# Patient Record
Sex: Female | Born: 1945
Health system: Southern US, Community
[De-identification: ages and names within clinical notes are randomized; demographics above are authoritative.]

## PROBLEM LIST (undated history)

## (undated) DIAGNOSIS — J449 Chronic obstructive pulmonary disease, unspecified: Secondary | ICD-10-CM

## (undated) DIAGNOSIS — R011 Cardiac murmur, unspecified: Secondary | ICD-10-CM

## (undated) DIAGNOSIS — K219 Gastro-esophageal reflux disease without esophagitis: Secondary | ICD-10-CM

## (undated) DIAGNOSIS — I10 Essential (primary) hypertension: Secondary | ICD-10-CM

## (undated) DIAGNOSIS — J45909 Unspecified asthma, uncomplicated: Secondary | ICD-10-CM

## (undated) DIAGNOSIS — C9110 Chronic lymphocytic leukemia of B-cell type not having achieved remission: Secondary | ICD-10-CM

## (undated) DIAGNOSIS — R519 Headache, unspecified: Secondary | ICD-10-CM

## (undated) DIAGNOSIS — E119 Type 2 diabetes mellitus without complications: Secondary | ICD-10-CM

## (undated) DIAGNOSIS — N189 Chronic kidney disease, unspecified: Secondary | ICD-10-CM

## (undated) DIAGNOSIS — I639 Cerebral infarction, unspecified: Secondary | ICD-10-CM

## (undated) DIAGNOSIS — J189 Pneumonia, unspecified organism: Secondary | ICD-10-CM

## (undated) DIAGNOSIS — I5032 Chronic diastolic (congestive) heart failure: Secondary | ICD-10-CM

## (undated) DIAGNOSIS — D496 Neoplasm of unspecified behavior of brain: Secondary | ICD-10-CM

## (undated) DIAGNOSIS — E785 Hyperlipidemia, unspecified: Secondary | ICD-10-CM

## (undated) DIAGNOSIS — I739 Peripheral vascular disease, unspecified: Secondary | ICD-10-CM

## (undated) DIAGNOSIS — C9590 Leukemia, unspecified not having achieved remission: Secondary | ICD-10-CM

## (undated) DIAGNOSIS — T7840XA Allergy, unspecified, initial encounter: Secondary | ICD-10-CM

## (undated) HISTORY — DX: Essential (primary) hypertension: I10

## (undated) HISTORY — PX: BRAIN TUMOR EXCISION: SHX577

## (undated) HISTORY — PX: OTHER SURGICAL HISTORY: SHX169

## (undated) HISTORY — PX: CHOLECYSTECTOMY, LAPAROSCOPIC: SHX56

## (undated) HISTORY — DX: Gastro-esophageal reflux disease without esophagitis: K21.9

## (undated) HISTORY — DX: Chronic diastolic (congestive) heart failure: I50.32

## (undated) HISTORY — DX: Allergy, unspecified, initial encounter: T78.40XA

## (undated) HISTORY — PX: CARDIAC CATHETERIZATION: SHX172

## (undated) HISTORY — DX: Peripheral vascular disease, unspecified: I73.9

## (undated) HISTORY — PX: ESOPHAGEAL DILATION: SHX303

## (undated) HISTORY — DX: Hyperlipidemia, unspecified: E78.5

## (undated) HISTORY — DX: Pneumonia, unspecified organism: J18.9

## (undated) HISTORY — DX: Chronic obstructive pulmonary disease, unspecified: J44.9

## (undated) HISTORY — PX: BRAIN MENINGIOMA EXCISION: SHX576

## (undated) HISTORY — DX: Cerebral infarction, unspecified: I63.9

## (undated) HISTORY — DX: Unspecified asthma, uncomplicated: J45.909

## (undated) HISTORY — DX: Chronic lymphocytic leukemia of B-cell type not having achieved remission: C91.10

---

## 1996-12-27 DIAGNOSIS — I639 Cerebral infarction, unspecified: Secondary | ICD-10-CM

## 1996-12-27 HISTORY — DX: Cerebral infarction, unspecified: I63.9

## 1998-10-23 ENCOUNTER — Encounter: Admission: RE | Admit: 1998-10-23 | Discharge: 1998-10-23 | Payer: Self-pay | Admitting: Family Medicine

## 1998-10-28 ENCOUNTER — Encounter: Admission: RE | Admit: 1998-10-28 | Discharge: 1998-10-28 | Payer: Self-pay | Admitting: Sports Medicine

## 1998-11-07 ENCOUNTER — Encounter: Admission: RE | Admit: 1998-11-07 | Discharge: 1998-11-07 | Payer: Self-pay | Admitting: Family Medicine

## 1999-02-24 ENCOUNTER — Encounter: Payer: Self-pay | Admitting: Emergency Medicine

## 1999-02-24 ENCOUNTER — Emergency Department (HOSPITAL_COMMUNITY): Admission: EM | Admit: 1999-02-24 | Discharge: 1999-02-24 | Payer: Self-pay | Admitting: Emergency Medicine

## 1999-03-02 ENCOUNTER — Encounter: Admission: RE | Admit: 1999-03-02 | Discharge: 1999-03-02 | Payer: Self-pay | Admitting: Family Medicine

## 1999-03-12 ENCOUNTER — Encounter: Admission: RE | Admit: 1999-03-12 | Discharge: 1999-03-12 | Payer: Self-pay | Admitting: Family Medicine

## 1999-04-02 ENCOUNTER — Encounter: Admission: RE | Admit: 1999-04-02 | Discharge: 1999-04-02 | Payer: Self-pay | Admitting: Family Medicine

## 1999-04-09 ENCOUNTER — Encounter: Admission: RE | Admit: 1999-04-09 | Discharge: 1999-04-09 | Payer: Self-pay | Admitting: Family Medicine

## 1999-04-13 ENCOUNTER — Encounter: Admission: RE | Admit: 1999-04-13 | Discharge: 1999-04-13 | Payer: Self-pay | Admitting: Sports Medicine

## 1999-04-16 ENCOUNTER — Encounter: Admission: RE | Admit: 1999-04-16 | Discharge: 1999-04-16 | Payer: Self-pay | Admitting: Family Medicine

## 1999-09-17 ENCOUNTER — Encounter: Admission: RE | Admit: 1999-09-17 | Discharge: 1999-09-17 | Payer: Self-pay | Admitting: Family Medicine

## 1999-10-20 ENCOUNTER — Encounter: Admission: RE | Admit: 1999-10-20 | Discharge: 1999-10-20 | Payer: Self-pay | Admitting: Sports Medicine

## 1999-11-12 ENCOUNTER — Encounter: Admission: RE | Admit: 1999-11-12 | Discharge: 1999-11-12 | Payer: Self-pay | Admitting: Family Medicine

## 1999-12-03 ENCOUNTER — Encounter: Admission: RE | Admit: 1999-12-03 | Discharge: 1999-12-03 | Payer: Self-pay | Admitting: Family Medicine

## 2000-02-08 ENCOUNTER — Emergency Department (HOSPITAL_COMMUNITY): Admission: EM | Admit: 2000-02-08 | Discharge: 2000-02-08 | Payer: Self-pay | Admitting: *Deleted

## 2000-02-08 ENCOUNTER — Encounter: Payer: Self-pay | Admitting: Emergency Medicine

## 2000-02-12 ENCOUNTER — Encounter: Admission: RE | Admit: 2000-02-12 | Discharge: 2000-02-12 | Payer: Self-pay | Admitting: Family Medicine

## 2000-02-19 ENCOUNTER — Encounter: Admission: RE | Admit: 2000-02-19 | Discharge: 2000-02-19 | Payer: Self-pay | Admitting: Family Medicine

## 2000-02-22 ENCOUNTER — Encounter: Admission: RE | Admit: 2000-02-22 | Discharge: 2000-02-22 | Payer: Self-pay | Admitting: Family Medicine

## 2000-02-26 ENCOUNTER — Encounter: Admission: RE | Admit: 2000-02-26 | Discharge: 2000-02-26 | Payer: Self-pay | Admitting: Family Medicine

## 2000-03-31 ENCOUNTER — Encounter: Admission: RE | Admit: 2000-03-31 | Discharge: 2000-03-31 | Payer: Self-pay | Admitting: Obstetrics

## 2000-04-13 ENCOUNTER — Ambulatory Visit (HOSPITAL_COMMUNITY): Admission: RE | Admit: 2000-04-13 | Discharge: 2000-04-13 | Payer: Self-pay | Admitting: Obstetrics & Gynecology

## 2000-04-13 ENCOUNTER — Encounter: Payer: Self-pay | Admitting: Obstetrics & Gynecology

## 2000-04-19 ENCOUNTER — Encounter: Admission: RE | Admit: 2000-04-19 | Discharge: 2000-04-19 | Payer: Self-pay | Admitting: Obstetrics & Gynecology

## 2000-06-14 ENCOUNTER — Encounter: Admission: RE | Admit: 2000-06-14 | Discharge: 2000-06-14 | Payer: Self-pay | Admitting: Obstetrics

## 2000-08-11 ENCOUNTER — Encounter: Admission: RE | Admit: 2000-08-11 | Discharge: 2000-08-11 | Payer: Self-pay | Admitting: Family Medicine

## 2000-12-04 ENCOUNTER — Emergency Department (HOSPITAL_COMMUNITY): Admission: EM | Admit: 2000-12-04 | Discharge: 2000-12-04 | Payer: Self-pay | Admitting: Emergency Medicine

## 2000-12-13 ENCOUNTER — Encounter: Admission: RE | Admit: 2000-12-13 | Discharge: 2000-12-13 | Payer: Self-pay | Admitting: Family Medicine

## 2001-03-14 ENCOUNTER — Encounter: Admission: RE | Admit: 2001-03-14 | Discharge: 2001-03-14 | Payer: Self-pay | Admitting: Family Medicine

## 2001-05-19 ENCOUNTER — Encounter: Admission: RE | Admit: 2001-05-19 | Discharge: 2001-05-19 | Payer: Self-pay | Admitting: Family Medicine

## 2001-05-19 ENCOUNTER — Encounter: Payer: Self-pay | Admitting: Family Medicine

## 2001-05-20 ENCOUNTER — Inpatient Hospital Stay (HOSPITAL_COMMUNITY): Admission: AD | Admit: 2001-05-20 | Discharge: 2001-05-22 | Payer: Self-pay | Admitting: Family Medicine

## 2001-05-21 ENCOUNTER — Encounter: Payer: Self-pay | Admitting: Family Medicine

## 2001-05-23 ENCOUNTER — Encounter: Admission: RE | Admit: 2001-05-23 | Discharge: 2001-05-23 | Payer: Self-pay | Admitting: Family Medicine

## 2001-05-26 ENCOUNTER — Emergency Department (HOSPITAL_COMMUNITY): Admission: EM | Admit: 2001-05-26 | Discharge: 2001-05-26 | Payer: Self-pay | Admitting: Emergency Medicine

## 2001-05-30 ENCOUNTER — Encounter: Admission: RE | Admit: 2001-05-30 | Discharge: 2001-05-30 | Payer: Self-pay | Admitting: Family Medicine

## 2001-06-04 ENCOUNTER — Encounter: Payer: Self-pay | Admitting: Emergency Medicine

## 2001-06-04 ENCOUNTER — Inpatient Hospital Stay (HOSPITAL_COMMUNITY): Admission: EM | Admit: 2001-06-04 | Discharge: 2001-06-07 | Payer: Self-pay | Admitting: Obstetrics and Gynecology

## 2001-06-14 ENCOUNTER — Encounter: Admission: RE | Admit: 2001-06-14 | Discharge: 2001-09-12 | Payer: Self-pay | Admitting: Sports Medicine

## 2001-06-28 ENCOUNTER — Encounter: Admission: RE | Admit: 2001-06-28 | Discharge: 2001-06-28 | Payer: Self-pay | Admitting: Family Medicine

## 2001-07-18 ENCOUNTER — Encounter: Admission: RE | Admit: 2001-07-18 | Discharge: 2001-07-18 | Payer: Self-pay | Admitting: Family Medicine

## 2001-10-04 ENCOUNTER — Encounter: Admission: RE | Admit: 2001-10-04 | Discharge: 2001-10-04 | Payer: Self-pay | Admitting: Family Medicine

## 2001-11-15 ENCOUNTER — Encounter: Admission: RE | Admit: 2001-11-15 | Discharge: 2001-11-15 | Payer: Self-pay | Admitting: Family Medicine

## 2002-01-04 ENCOUNTER — Encounter: Admission: RE | Admit: 2002-01-04 | Discharge: 2002-01-04 | Payer: Self-pay | Admitting: Family Medicine

## 2002-01-15 ENCOUNTER — Ambulatory Visit (HOSPITAL_COMMUNITY): Admission: RE | Admit: 2002-01-15 | Discharge: 2002-01-15 | Payer: Self-pay | Admitting: Family Medicine

## 2002-01-24 ENCOUNTER — Encounter: Admission: RE | Admit: 2002-01-24 | Discharge: 2002-01-24 | Payer: Self-pay | Admitting: Family Medicine

## 2002-01-30 ENCOUNTER — Ambulatory Visit (HOSPITAL_COMMUNITY): Admission: RE | Admit: 2002-01-30 | Discharge: 2002-01-30 | Payer: Self-pay | Admitting: Cardiovascular Disease

## 2002-01-30 ENCOUNTER — Encounter: Payer: Self-pay | Admitting: Cardiovascular Disease

## 2002-02-14 ENCOUNTER — Encounter: Admission: RE | Admit: 2002-02-14 | Discharge: 2002-02-14 | Payer: Self-pay | Admitting: Family Medicine

## 2002-03-01 ENCOUNTER — Observation Stay (HOSPITAL_COMMUNITY): Admission: EM | Admit: 2002-03-01 | Discharge: 2002-03-02 | Payer: Self-pay | Admitting: Emergency Medicine

## 2002-03-01 ENCOUNTER — Encounter: Payer: Self-pay | Admitting: Emergency Medicine

## 2002-03-04 ENCOUNTER — Emergency Department (HOSPITAL_COMMUNITY): Admission: EM | Admit: 2002-03-04 | Discharge: 2002-03-04 | Payer: Self-pay | Admitting: Emergency Medicine

## 2002-03-04 ENCOUNTER — Encounter: Payer: Self-pay | Admitting: Emergency Medicine

## 2002-03-06 ENCOUNTER — Encounter: Admission: RE | Admit: 2002-03-06 | Discharge: 2002-03-06 | Payer: Self-pay | Admitting: Sports Medicine

## 2002-03-16 ENCOUNTER — Encounter: Admission: RE | Admit: 2002-03-16 | Discharge: 2002-03-16 | Payer: Self-pay | Admitting: Family Medicine

## 2002-03-22 ENCOUNTER — Ambulatory Visit (HOSPITAL_COMMUNITY): Admission: RE | Admit: 2002-03-22 | Discharge: 2002-03-22 | Payer: Self-pay | Admitting: Cardiovascular Disease

## 2002-03-23 ENCOUNTER — Ambulatory Visit (HOSPITAL_COMMUNITY): Admission: RE | Admit: 2002-03-23 | Discharge: 2002-03-23 | Payer: Self-pay | Admitting: Cardiovascular Disease

## 2002-03-29 ENCOUNTER — Encounter: Admission: RE | Admit: 2002-03-29 | Discharge: 2002-03-29 | Payer: Self-pay | Admitting: Family Medicine

## 2002-04-07 ENCOUNTER — Emergency Department (HOSPITAL_COMMUNITY): Admission: EM | Admit: 2002-04-07 | Discharge: 2002-04-07 | Payer: Self-pay

## 2002-04-08 ENCOUNTER — Ambulatory Visit (HOSPITAL_COMMUNITY): Admission: RE | Admit: 2002-04-08 | Discharge: 2002-04-08 | Payer: Self-pay

## 2002-04-11 ENCOUNTER — Encounter (INDEPENDENT_AMBULATORY_CARE_PROVIDER_SITE_OTHER): Payer: Self-pay | Admitting: Specialist

## 2002-04-11 ENCOUNTER — Encounter: Payer: Self-pay | Admitting: General Surgery

## 2002-04-11 ENCOUNTER — Ambulatory Visit (HOSPITAL_COMMUNITY): Admission: RE | Admit: 2002-04-11 | Discharge: 2002-04-12 | Payer: Self-pay | Admitting: General Surgery

## 2002-04-18 ENCOUNTER — Encounter: Admission: RE | Admit: 2002-04-18 | Discharge: 2002-04-18 | Payer: Self-pay | Admitting: Family Medicine

## 2002-05-30 ENCOUNTER — Encounter: Admission: RE | Admit: 2002-05-30 | Discharge: 2002-05-30 | Payer: Self-pay | Admitting: Family Medicine

## 2002-07-05 ENCOUNTER — Encounter: Admission: RE | Admit: 2002-07-05 | Discharge: 2002-07-05 | Payer: Self-pay | Admitting: Family Medicine

## 2002-07-13 ENCOUNTER — Encounter: Admission: RE | Admit: 2002-07-13 | Discharge: 2002-07-13 | Payer: Self-pay | Admitting: Family Medicine

## 2002-07-16 ENCOUNTER — Encounter: Admission: RE | Admit: 2002-07-16 | Discharge: 2002-07-16 | Payer: Self-pay | Admitting: Family Medicine

## 2002-07-18 ENCOUNTER — Encounter: Admission: RE | Admit: 2002-07-18 | Discharge: 2002-07-18 | Payer: Self-pay | Admitting: Family Medicine

## 2002-07-30 ENCOUNTER — Encounter: Admission: RE | Admit: 2002-07-30 | Discharge: 2002-07-30 | Payer: Self-pay | Admitting: Sports Medicine

## 2002-07-30 ENCOUNTER — Encounter: Payer: Self-pay | Admitting: Sports Medicine

## 2002-09-28 ENCOUNTER — Encounter: Admission: RE | Admit: 2002-09-28 | Discharge: 2002-09-28 | Payer: Self-pay | Admitting: Sports Medicine

## 2002-10-01 ENCOUNTER — Encounter: Admission: RE | Admit: 2002-10-01 | Discharge: 2002-10-01 | Payer: Self-pay | Admitting: Family Medicine

## 2002-11-11 ENCOUNTER — Emergency Department (HOSPITAL_COMMUNITY): Admission: EM | Admit: 2002-11-11 | Discharge: 2002-11-11 | Payer: Self-pay | Admitting: Emergency Medicine

## 2003-01-10 ENCOUNTER — Encounter: Admission: RE | Admit: 2003-01-10 | Discharge: 2003-01-10 | Payer: Self-pay | Admitting: Family Medicine

## 2003-01-17 ENCOUNTER — Encounter: Admission: RE | Admit: 2003-01-17 | Discharge: 2003-01-17 | Payer: Self-pay | Admitting: Family Medicine

## 2003-03-14 ENCOUNTER — Encounter: Admission: RE | Admit: 2003-03-14 | Discharge: 2003-03-14 | Payer: Self-pay | Admitting: Family Medicine

## 2003-08-26 ENCOUNTER — Encounter: Admission: RE | Admit: 2003-08-26 | Discharge: 2003-08-26 | Payer: Self-pay | Admitting: Family Medicine

## 2003-10-09 ENCOUNTER — Encounter: Admission: RE | Admit: 2003-10-09 | Discharge: 2003-10-09 | Payer: Self-pay | Admitting: Family Medicine

## 2003-10-16 ENCOUNTER — Encounter: Admission: RE | Admit: 2003-10-16 | Discharge: 2003-10-16 | Payer: Self-pay | Admitting: Family Medicine

## 2003-11-04 ENCOUNTER — Encounter: Admission: RE | Admit: 2003-11-04 | Discharge: 2003-11-04 | Payer: Self-pay | Admitting: Family Medicine

## 2003-11-28 ENCOUNTER — Encounter: Admission: RE | Admit: 2003-11-28 | Discharge: 2003-11-28 | Payer: Self-pay | Admitting: Family Medicine

## 2003-12-30 ENCOUNTER — Encounter: Admission: RE | Admit: 2003-12-30 | Discharge: 2003-12-30 | Payer: Self-pay | Admitting: Family Medicine

## 2004-01-06 ENCOUNTER — Encounter: Admission: RE | Admit: 2004-01-06 | Discharge: 2004-01-06 | Payer: Self-pay | Admitting: Family Medicine

## 2004-01-14 ENCOUNTER — Encounter: Admission: RE | Admit: 2004-01-14 | Discharge: 2004-01-14 | Payer: Self-pay | Admitting: Family Medicine

## 2004-01-31 ENCOUNTER — Encounter: Admission: RE | Admit: 2004-01-31 | Discharge: 2004-01-31 | Payer: Self-pay | Admitting: Sports Medicine

## 2004-02-08 ENCOUNTER — Emergency Department (HOSPITAL_COMMUNITY): Admission: EM | Admit: 2004-02-08 | Discharge: 2004-02-08 | Payer: Self-pay | Admitting: Family Medicine

## 2004-02-19 ENCOUNTER — Encounter: Admission: RE | Admit: 2004-02-19 | Discharge: 2004-02-19 | Payer: Self-pay | Admitting: Family Medicine

## 2004-02-20 ENCOUNTER — Ambulatory Visit (HOSPITAL_COMMUNITY): Admission: RE | Admit: 2004-02-20 | Discharge: 2004-02-20 | Payer: Self-pay | Admitting: Gastroenterology

## 2004-02-25 ENCOUNTER — Encounter (INDEPENDENT_AMBULATORY_CARE_PROVIDER_SITE_OTHER): Payer: Self-pay | Admitting: *Deleted

## 2004-02-25 LAB — CONVERTED CEMR LAB

## 2004-03-26 ENCOUNTER — Encounter: Admission: RE | Admit: 2004-03-26 | Discharge: 2004-03-26 | Payer: Self-pay | Admitting: Sports Medicine

## 2004-04-17 ENCOUNTER — Emergency Department (HOSPITAL_COMMUNITY): Admission: EM | Admit: 2004-04-17 | Discharge: 2004-04-17 | Payer: Self-pay | Admitting: Family Medicine

## 2004-04-21 ENCOUNTER — Encounter: Admission: RE | Admit: 2004-04-21 | Discharge: 2004-04-21 | Payer: Self-pay | Admitting: Sports Medicine

## 2004-10-03 ENCOUNTER — Emergency Department (HOSPITAL_COMMUNITY): Admission: EM | Admit: 2004-10-03 | Discharge: 2004-10-03 | Payer: Self-pay | Admitting: Emergency Medicine

## 2004-10-09 ENCOUNTER — Ambulatory Visit: Payer: Self-pay | Admitting: Family Medicine

## 2004-10-16 ENCOUNTER — Ambulatory Visit: Payer: Self-pay | Admitting: Family Medicine

## 2004-11-11 ENCOUNTER — Ambulatory Visit: Payer: Self-pay | Admitting: Family Medicine

## 2004-12-31 ENCOUNTER — Ambulatory Visit: Payer: Self-pay | Admitting: Family Medicine

## 2005-02-01 ENCOUNTER — Ambulatory Visit: Payer: Self-pay | Admitting: Family Medicine

## 2005-03-03 ENCOUNTER — Ambulatory Visit: Payer: Self-pay | Admitting: *Deleted

## 2005-03-04 ENCOUNTER — Encounter: Admission: RE | Admit: 2005-03-04 | Discharge: 2005-03-04 | Payer: Self-pay | Admitting: Sports Medicine

## 2005-04-14 ENCOUNTER — Ambulatory Visit: Payer: Self-pay | Admitting: Family Medicine

## 2005-05-03 ENCOUNTER — Ambulatory Visit: Payer: Self-pay | Admitting: Family Medicine

## 2005-05-20 ENCOUNTER — Ambulatory Visit: Payer: Self-pay | Admitting: Family Medicine

## 2005-06-21 ENCOUNTER — Ambulatory Visit: Payer: Self-pay | Admitting: Sports Medicine

## 2005-07-06 ENCOUNTER — Ambulatory Visit: Payer: Self-pay | Admitting: Family Medicine

## 2005-08-11 ENCOUNTER — Ambulatory Visit: Payer: Self-pay | Admitting: Family Medicine

## 2005-08-25 ENCOUNTER — Ambulatory Visit: Payer: Self-pay | Admitting: Family Medicine

## 2005-12-02 ENCOUNTER — Ambulatory Visit: Payer: Self-pay | Admitting: Family Medicine

## 2005-12-13 ENCOUNTER — Emergency Department (HOSPITAL_COMMUNITY): Admission: EM | Admit: 2005-12-13 | Discharge: 2005-12-13 | Payer: Self-pay | Admitting: Emergency Medicine

## 2005-12-30 ENCOUNTER — Ambulatory Visit: Payer: Self-pay | Admitting: Family Medicine

## 2005-12-31 ENCOUNTER — Encounter: Admission: RE | Admit: 2005-12-31 | Discharge: 2005-12-31 | Payer: Self-pay | Admitting: Sports Medicine

## 2006-01-13 ENCOUNTER — Ambulatory Visit: Payer: Self-pay | Admitting: Family Medicine

## 2006-01-26 ENCOUNTER — Ambulatory Visit: Payer: Self-pay | Admitting: Family Medicine

## 2006-02-04 ENCOUNTER — Ambulatory Visit: Payer: Self-pay | Admitting: Family Medicine

## 2006-02-17 ENCOUNTER — Ambulatory Visit: Payer: Self-pay | Admitting: Family Medicine

## 2006-03-01 ENCOUNTER — Encounter: Admission: RE | Admit: 2006-03-01 | Discharge: 2006-03-16 | Payer: Self-pay | Admitting: Family Medicine

## 2006-03-18 ENCOUNTER — Ambulatory Visit: Payer: Self-pay | Admitting: Family Medicine

## 2006-04-06 ENCOUNTER — Ambulatory Visit: Payer: Self-pay | Admitting: Family Medicine

## 2006-05-11 ENCOUNTER — Ambulatory Visit: Payer: Self-pay | Admitting: Family Medicine

## 2006-08-24 ENCOUNTER — Ambulatory Visit: Payer: Self-pay | Admitting: Family Medicine

## 2006-09-27 ENCOUNTER — Ambulatory Visit: Payer: Self-pay | Admitting: Sports Medicine

## 2006-10-07 ENCOUNTER — Ambulatory Visit: Payer: Self-pay | Admitting: Family Medicine

## 2006-10-27 ENCOUNTER — Ambulatory Visit (HOSPITAL_COMMUNITY): Admission: RE | Admit: 2006-10-27 | Discharge: 2006-10-27 | Payer: Self-pay | Admitting: Family Medicine

## 2006-10-27 ENCOUNTER — Ambulatory Visit: Payer: Self-pay | Admitting: Sports Medicine

## 2007-02-14 ENCOUNTER — Ambulatory Visit: Payer: Self-pay | Admitting: Family Medicine

## 2007-02-23 DIAGNOSIS — F17200 Nicotine dependence, unspecified, uncomplicated: Secondary | ICD-10-CM | POA: Insufficient documentation

## 2007-02-23 DIAGNOSIS — E7800 Pure hypercholesterolemia, unspecified: Secondary | ICD-10-CM | POA: Insufficient documentation

## 2007-02-23 DIAGNOSIS — F411 Generalized anxiety disorder: Secondary | ICD-10-CM | POA: Insufficient documentation

## 2007-02-23 DIAGNOSIS — I10 Essential (primary) hypertension: Secondary | ICD-10-CM | POA: Insufficient documentation

## 2007-02-23 DIAGNOSIS — J449 Chronic obstructive pulmonary disease, unspecified: Secondary | ICD-10-CM | POA: Insufficient documentation

## 2007-02-23 DIAGNOSIS — K219 Gastro-esophageal reflux disease without esophagitis: Secondary | ICD-10-CM | POA: Insufficient documentation

## 2007-02-24 ENCOUNTER — Encounter (INDEPENDENT_AMBULATORY_CARE_PROVIDER_SITE_OTHER): Payer: Self-pay | Admitting: *Deleted

## 2007-03-09 ENCOUNTER — Ambulatory Visit: Payer: Self-pay | Admitting: Sports Medicine

## 2007-03-09 LAB — CONVERTED CEMR LAB: Hgb A1c MFr Bld: 6.4 %

## 2007-04-05 ENCOUNTER — Ambulatory Visit (HOSPITAL_COMMUNITY): Admission: RE | Admit: 2007-04-05 | Discharge: 2007-04-05 | Payer: Self-pay | Admitting: Cardiovascular Disease

## 2007-05-15 ENCOUNTER — Encounter (INDEPENDENT_AMBULATORY_CARE_PROVIDER_SITE_OTHER): Payer: Self-pay | Admitting: Family Medicine

## 2007-05-15 ENCOUNTER — Ambulatory Visit: Payer: Self-pay | Admitting: Family Medicine

## 2007-05-15 LAB — CONVERTED CEMR LAB
BUN: 10 mg/dL (ref 6–23)
CO2: 31 meq/L (ref 19–32)
Calcium: 9.7 mg/dL (ref 8.4–10.5)
Chloride: 101 meq/L (ref 96–112)
Creatinine, Ser: 0.97 mg/dL (ref 0.40–1.20)
Glucose, Bld: 111 mg/dL — ABNORMAL HIGH (ref 70–99)
Potassium: 4.3 meq/L (ref 3.5–5.3)
Sodium: 143 meq/L (ref 135–145)

## 2007-06-06 ENCOUNTER — Encounter (INDEPENDENT_AMBULATORY_CARE_PROVIDER_SITE_OTHER): Payer: Self-pay | Admitting: *Deleted

## 2007-08-29 ENCOUNTER — Telehealth: Payer: Self-pay | Admitting: *Deleted

## 2007-08-30 ENCOUNTER — Ambulatory Visit: Payer: Self-pay | Admitting: Family Medicine

## 2007-09-28 ENCOUNTER — Encounter (INDEPENDENT_AMBULATORY_CARE_PROVIDER_SITE_OTHER): Payer: Self-pay | Admitting: *Deleted

## 2007-10-02 ENCOUNTER — Ambulatory Visit: Payer: Self-pay | Admitting: Family Medicine

## 2007-10-11 ENCOUNTER — Ambulatory Visit (HOSPITAL_COMMUNITY): Admission: RE | Admit: 2007-10-11 | Discharge: 2007-10-11 | Payer: Self-pay | Admitting: Family Medicine

## 2007-10-11 ENCOUNTER — Encounter: Payer: Self-pay | Admitting: Family Medicine

## 2007-10-11 ENCOUNTER — Ambulatory Visit: Payer: Self-pay | Admitting: Vascular Surgery

## 2007-10-13 ENCOUNTER — Encounter (INDEPENDENT_AMBULATORY_CARE_PROVIDER_SITE_OTHER): Payer: Self-pay | Admitting: *Deleted

## 2007-10-13 ENCOUNTER — Encounter (INDEPENDENT_AMBULATORY_CARE_PROVIDER_SITE_OTHER): Payer: Self-pay | Admitting: Family Medicine

## 2007-10-13 DIAGNOSIS — I739 Peripheral vascular disease, unspecified: Secondary | ICD-10-CM | POA: Insufficient documentation

## 2007-11-13 ENCOUNTER — Ambulatory Visit: Payer: Self-pay | Admitting: Family Medicine

## 2007-12-11 ENCOUNTER — Ambulatory Visit: Payer: Self-pay | Admitting: Surgery

## 2007-12-12 ENCOUNTER — Ambulatory Visit: Payer: Self-pay | Admitting: Family Medicine

## 2007-12-12 ENCOUNTER — Telehealth: Payer: Self-pay | Admitting: *Deleted

## 2007-12-29 ENCOUNTER — Ambulatory Visit: Payer: Self-pay | Admitting: Surgery

## 2007-12-29 ENCOUNTER — Ambulatory Visit (HOSPITAL_COMMUNITY): Admission: RE | Admit: 2007-12-29 | Discharge: 2007-12-29 | Payer: Self-pay | Admitting: Surgery

## 2008-01-22 ENCOUNTER — Ambulatory Visit: Payer: Self-pay | Admitting: Surgery

## 2008-03-05 ENCOUNTER — Telehealth (INDEPENDENT_AMBULATORY_CARE_PROVIDER_SITE_OTHER): Payer: Self-pay | Admitting: *Deleted

## 2008-03-19 ENCOUNTER — Telehealth: Payer: Self-pay | Admitting: *Deleted

## 2008-03-29 ENCOUNTER — Encounter: Payer: Self-pay | Admitting: *Deleted

## 2008-04-19 ENCOUNTER — Emergency Department (HOSPITAL_COMMUNITY): Admission: EM | Admit: 2008-04-19 | Discharge: 2008-04-19 | Payer: Self-pay | Admitting: Emergency Medicine

## 2008-04-22 ENCOUNTER — Telehealth: Payer: Self-pay | Admitting: *Deleted

## 2008-04-25 ENCOUNTER — Ambulatory Visit: Payer: Self-pay | Admitting: Family Medicine

## 2008-05-01 ENCOUNTER — Encounter (INDEPENDENT_AMBULATORY_CARE_PROVIDER_SITE_OTHER): Payer: Self-pay | Admitting: Family Medicine

## 2008-05-06 ENCOUNTER — Ambulatory Visit: Payer: Self-pay | Admitting: Family Medicine

## 2008-06-03 ENCOUNTER — Ambulatory Visit: Payer: Self-pay | Admitting: Family Medicine

## 2008-06-06 ENCOUNTER — Telehealth: Payer: Self-pay | Admitting: *Deleted

## 2008-06-12 ENCOUNTER — Ambulatory Visit: Payer: Self-pay | Admitting: Family Medicine

## 2008-06-12 ENCOUNTER — Encounter (INDEPENDENT_AMBULATORY_CARE_PROVIDER_SITE_OTHER): Payer: Self-pay | Admitting: Family Medicine

## 2008-06-12 LAB — CONVERTED CEMR LAB

## 2008-06-13 ENCOUNTER — Encounter (INDEPENDENT_AMBULATORY_CARE_PROVIDER_SITE_OTHER): Payer: Self-pay | Admitting: Family Medicine

## 2008-06-13 LAB — CONVERTED CEMR LAB
ALT: 23 units/L (ref 0–35)
AST: 19 units/L (ref 0–37)
Albumin: 4.4 g/dL (ref 3.5–5.2)
Alkaline Phosphatase: 119 units/L — ABNORMAL HIGH (ref 39–117)
BUN: 12 mg/dL (ref 6–23)
CO2: 29 meq/L (ref 19–32)
Calcium: 9.7 mg/dL (ref 8.4–10.5)
Chloride: 101 meq/L (ref 96–112)
Cholesterol: 238 mg/dL — ABNORMAL HIGH (ref 0–200)
Creatinine, Ser: 0.99 mg/dL (ref 0.40–1.20)
Glucose, Bld: 149 mg/dL — ABNORMAL HIGH (ref 70–99)
HDL: 36 mg/dL — ABNORMAL LOW (ref >39)
LDL Cholesterol: 126 mg/dL — ABNORMAL HIGH (ref 0–99)
Potassium: 3.7 meq/L (ref 3.5–5.3)
Sodium: 143 meq/L (ref 135–145)
Total Bilirubin: 0.4 mg/dL (ref 0.3–1.2)
Total CHOL/HDL Ratio: 6.6
Total Protein: 6.8 g/dL (ref 6.0–8.3)
Triglycerides: 379 mg/dL — ABNORMAL HIGH (ref <150)
VLDL: 76 mg/dL — ABNORMAL HIGH (ref 0–40)

## 2008-07-09 ENCOUNTER — Encounter (INDEPENDENT_AMBULATORY_CARE_PROVIDER_SITE_OTHER): Payer: Self-pay | Admitting: Family Medicine

## 2008-07-11 ENCOUNTER — Encounter: Admission: RE | Admit: 2008-07-11 | Discharge: 2008-09-03 | Payer: Self-pay | Admitting: Family Medicine

## 2008-08-29 ENCOUNTER — Encounter: Payer: Self-pay | Admitting: *Deleted

## 2008-09-24 ENCOUNTER — Emergency Department (HOSPITAL_COMMUNITY): Admission: EM | Admit: 2008-09-24 | Discharge: 2008-09-24 | Payer: Self-pay | Admitting: Emergency Medicine

## 2008-10-01 ENCOUNTER — Ambulatory Visit: Payer: Self-pay | Admitting: Family Medicine

## 2008-10-01 ENCOUNTER — Encounter (INDEPENDENT_AMBULATORY_CARE_PROVIDER_SITE_OTHER): Payer: Self-pay | Admitting: Family Medicine

## 2008-10-01 LAB — CONVERTED CEMR LAB
ALT: 14 units/L (ref 0–35)
AST: 15 units/L (ref 0–37)
Albumin: 4.1 g/dL (ref 3.5–5.2)
Alkaline Phosphatase: 133 units/L — ABNORMAL HIGH (ref 39–117)
BUN: 6 mg/dL (ref 6–23)
CO2: 28 meq/L (ref 19–32)
Calcium: 9.4 mg/dL (ref 8.4–10.5)
Chloride: 102 meq/L (ref 96–112)
Creatinine, Ser: 0.9 mg/dL (ref 0.40–1.20)
Direct LDL: 62 mg/dL
Glucose, Bld: 84 mg/dL (ref 70–99)
Pap Smear: NORMAL
Potassium: 4.4 meq/L (ref 3.5–5.3)
Sodium: 141 meq/L (ref 135–145)
Total Bilirubin: 0.3 mg/dL (ref 0.3–1.2)
Total Protein: 6.6 g/dL (ref 6.0–8.3)

## 2008-10-02 ENCOUNTER — Encounter (INDEPENDENT_AMBULATORY_CARE_PROVIDER_SITE_OTHER): Payer: Self-pay | Admitting: *Deleted

## 2008-10-03 ENCOUNTER — Encounter: Admission: RE | Admit: 2008-10-03 | Discharge: 2008-10-03 | Payer: Self-pay | Admitting: Family Medicine

## 2008-10-10 ENCOUNTER — Ambulatory Visit: Payer: Self-pay | Admitting: Surgery

## 2008-10-10 ENCOUNTER — Encounter (INDEPENDENT_AMBULATORY_CARE_PROVIDER_SITE_OTHER): Payer: Self-pay | Admitting: Family Medicine

## 2008-10-22 ENCOUNTER — Encounter (INDEPENDENT_AMBULATORY_CARE_PROVIDER_SITE_OTHER): Payer: Self-pay | Admitting: *Deleted

## 2008-10-22 ENCOUNTER — Encounter: Admission: RE | Admit: 2008-10-22 | Discharge: 2008-10-22 | Payer: Self-pay | Admitting: Family Medicine

## 2008-10-22 ENCOUNTER — Encounter (INDEPENDENT_AMBULATORY_CARE_PROVIDER_SITE_OTHER): Payer: Self-pay | Admitting: Family Medicine

## 2008-10-24 ENCOUNTER — Ambulatory Visit: Payer: Self-pay | Admitting: Surgery

## 2008-10-29 ENCOUNTER — Encounter: Admission: RE | Admit: 2008-10-29 | Discharge: 2008-10-29 | Payer: Self-pay | Admitting: Family Medicine

## 2008-10-29 ENCOUNTER — Encounter (INDEPENDENT_AMBULATORY_CARE_PROVIDER_SITE_OTHER): Payer: Self-pay | Admitting: Diagnostic Radiology

## 2008-10-29 ENCOUNTER — Encounter (INDEPENDENT_AMBULATORY_CARE_PROVIDER_SITE_OTHER): Payer: Self-pay | Admitting: Family Medicine

## 2008-11-01 ENCOUNTER — Telehealth (INDEPENDENT_AMBULATORY_CARE_PROVIDER_SITE_OTHER): Payer: Self-pay | Admitting: Family Medicine

## 2008-11-07 ENCOUNTER — Ambulatory Visit: Payer: Self-pay | Admitting: Genetic Counselor

## 2008-12-05 ENCOUNTER — Ambulatory Visit: Payer: Self-pay | Admitting: Family Medicine

## 2008-12-05 ENCOUNTER — Encounter (INDEPENDENT_AMBULATORY_CARE_PROVIDER_SITE_OTHER): Payer: Self-pay | Admitting: Family Medicine

## 2008-12-05 DIAGNOSIS — F4321 Adjustment disorder with depressed mood: Secondary | ICD-10-CM | POA: Insufficient documentation

## 2008-12-05 LAB — CONVERTED CEMR LAB
ALT: 24 units/L (ref 0–35)
AST: 20 units/L (ref 0–37)
Albumin: 4.1 g/dL (ref 3.5–5.2)
Alkaline Phosphatase: 101 units/L (ref 39–117)
BUN: 8 mg/dL (ref 6–23)
CO2: 25 meq/L (ref 19–32)
Calcium: 8.7 mg/dL (ref 8.4–10.5)
Chloride: 105 meq/L (ref 96–112)
Creatinine, Ser: 0.92 mg/dL (ref 0.40–1.20)
Direct LDL: 67 mg/dL
Glucose, Bld: 109 mg/dL — ABNORMAL HIGH (ref 70–99)
Potassium: 4 meq/L (ref 3.5–5.3)
Sodium: 141 meq/L (ref 135–145)
Total Bilirubin: 0.4 mg/dL (ref 0.3–1.2)
Total CK: 84 units/L (ref 7–177)
Total Protein: 6.5 g/dL (ref 6.0–8.3)

## 2009-01-06 ENCOUNTER — Ambulatory Visit: Payer: Self-pay | Admitting: Genetic Counselor

## 2009-02-03 ENCOUNTER — Ambulatory Visit: Payer: Self-pay | Admitting: Surgery

## 2009-03-28 ENCOUNTER — Emergency Department (HOSPITAL_COMMUNITY): Admission: EM | Admit: 2009-03-28 | Discharge: 2009-03-28 | Payer: Self-pay | Admitting: Emergency Medicine

## 2009-04-11 ENCOUNTER — Ambulatory Visit: Payer: Self-pay | Admitting: Family Medicine

## 2009-04-11 ENCOUNTER — Encounter: Payer: Self-pay | Admitting: Family Medicine

## 2009-04-11 DIAGNOSIS — M545 Low back pain, unspecified: Secondary | ICD-10-CM | POA: Insufficient documentation

## 2009-04-11 DIAGNOSIS — C9110 Chronic lymphocytic leukemia of B-cell type not having achieved remission: Secondary | ICD-10-CM | POA: Insufficient documentation

## 2009-04-11 LAB — CONVERTED CEMR LAB
ALT: 20 units/L (ref 0–35)
AST: 18 units/L (ref 0–37)
Albumin: 4.3 g/dL (ref 3.5–5.2)
Alkaline Phosphatase: 117 units/L (ref 39–117)
BUN: 10 mg/dL (ref 6–23)
Basophils Absolute: 0 10*3/uL (ref 0.0–0.1)
Basophils Relative: 0 % (ref 0–1)
CO2: 27 meq/L (ref 19–32)
Calcium: 9.5 mg/dL (ref 8.4–10.5)
Chloride: 105 meq/L (ref 96–112)
Creatinine, Ser: 0.95 mg/dL (ref 0.40–1.20)
Eosinophils Absolute: 0.2 10*3/uL (ref 0.0–0.7)
Eosinophils Relative: 1 % (ref 0–5)
Glucose, Bld: 116 mg/dL — ABNORMAL HIGH (ref 70–99)
HCT: 43.5 % (ref 36.0–46.0)
Hemoglobin: 14.1 g/dL (ref 12.0–15.0)
Lymphocytes Relative: 58 % — ABNORMAL HIGH (ref 12–46)
Lymphs Abs: 8 10*3/uL — ABNORMAL HIGH (ref 0.7–4.0)
MCHC: 32.4 g/dL (ref 30.0–36.0)
MCV: 91.2 fL (ref 78.0–100.0)
Monocytes Absolute: 1.3 10*3/uL — ABNORMAL HIGH (ref 0.1–1.0)
Monocytes Relative: 10 % (ref 3–12)
Neutro Abs: 4.3 10*3/uL (ref 1.7–7.7)
Neutrophils Relative %: 31 % — ABNORMAL LOW (ref 43–77)
Platelets: 263 10*3/uL (ref 150–400)
Potassium: 4.7 meq/L (ref 3.5–5.3)
RBC: 4.77 M/uL (ref 3.87–5.11)
RDW: 14.7 % (ref 11.5–15.5)
Sodium: 140 meq/L (ref 135–145)
Total Bilirubin: 0.4 mg/dL (ref 0.3–1.2)
Total Protein: 7.1 g/dL (ref 6.0–8.3)
WBC: 13.8 10*3/uL — ABNORMAL HIGH (ref 4.0–10.5)

## 2009-04-16 ENCOUNTER — Ambulatory Visit: Payer: Self-pay | Admitting: Family Medicine

## 2009-04-18 ENCOUNTER — Ambulatory Visit: Payer: Self-pay | Admitting: Oncology

## 2009-04-28 ENCOUNTER — Encounter (HOSPITAL_COMMUNITY): Payer: Self-pay | Admitting: Oncology

## 2009-04-28 ENCOUNTER — Encounter (INDEPENDENT_AMBULATORY_CARE_PROVIDER_SITE_OTHER): Payer: Self-pay | Admitting: Family Medicine

## 2009-04-28 ENCOUNTER — Other Ambulatory Visit: Admission: RE | Admit: 2009-04-28 | Discharge: 2009-04-28 | Payer: Self-pay | Admitting: Oncology

## 2009-04-29 ENCOUNTER — Ambulatory Visit: Payer: Self-pay | Admitting: Family Medicine

## 2009-05-01 ENCOUNTER — Encounter (INDEPENDENT_AMBULATORY_CARE_PROVIDER_SITE_OTHER): Payer: Self-pay | Admitting: Family Medicine

## 2009-05-02 ENCOUNTER — Encounter (INDEPENDENT_AMBULATORY_CARE_PROVIDER_SITE_OTHER): Payer: Self-pay | Admitting: Family Medicine

## 2009-05-02 ENCOUNTER — Ambulatory Visit: Payer: Self-pay | Admitting: Family Medicine

## 2009-05-02 LAB — CONVERTED CEMR LAB
BUN: 10 mg/dL (ref 6–23)
CO2: 25 meq/L (ref 19–32)
Calcium: 9 mg/dL (ref 8.4–10.5)
Chloride: 103 meq/L (ref 96–112)
Creatinine, Ser: 0.84 mg/dL (ref 0.40–1.20)
Glucose, Bld: 216 mg/dL — ABNORMAL HIGH (ref 70–99)
Potassium: 3.6 meq/L (ref 3.5–5.3)
Sodium: 139 meq/L (ref 135–145)

## 2009-05-12 ENCOUNTER — Encounter (INDEPENDENT_AMBULATORY_CARE_PROVIDER_SITE_OTHER): Payer: Self-pay | Admitting: Family Medicine

## 2009-06-02 ENCOUNTER — Encounter (INDEPENDENT_AMBULATORY_CARE_PROVIDER_SITE_OTHER): Payer: Self-pay | Admitting: Family Medicine

## 2009-06-02 ENCOUNTER — Ambulatory Visit: Payer: Self-pay | Admitting: Family Medicine

## 2009-06-02 LAB — CONVERTED CEMR LAB: Hgb A1c MFr Bld: 7.5 %

## 2009-06-03 ENCOUNTER — Encounter: Payer: Self-pay | Admitting: *Deleted

## 2009-06-03 LAB — CONVERTED CEMR LAB
BUN: 8 mg/dL (ref 6–23)
CO2: 30 meq/L (ref 19–32)
Calcium: 9.6 mg/dL (ref 8.4–10.5)
Chloride: 99 meq/L (ref 96–112)
Creatinine, Ser: 0.98 mg/dL (ref 0.40–1.20)
Glucose, Bld: 139 mg/dL — ABNORMAL HIGH (ref 70–99)
Potassium: 3.8 meq/L (ref 3.5–5.3)
Sodium: 141 meq/L (ref 135–145)

## 2009-06-11 ENCOUNTER — Ambulatory Visit: Payer: Self-pay | Admitting: Family Medicine

## 2009-06-11 DIAGNOSIS — E1151 Type 2 diabetes mellitus with diabetic peripheral angiopathy without gangrene: Secondary | ICD-10-CM | POA: Insufficient documentation

## 2009-06-15 ENCOUNTER — Telehealth: Payer: Self-pay | Admitting: Family Medicine

## 2009-06-16 ENCOUNTER — Encounter: Payer: Self-pay | Admitting: Family Medicine

## 2009-06-16 ENCOUNTER — Ambulatory Visit: Payer: Self-pay | Admitting: Family Medicine

## 2009-06-16 ENCOUNTER — Encounter: Admission: RE | Admit: 2009-06-16 | Discharge: 2009-06-24 | Payer: Self-pay | Admitting: Family Medicine

## 2009-06-16 LAB — CONVERTED CEMR LAB
ALT: 10 units/L (ref 0–35)
AST: 13 units/L (ref 0–37)
Albumin: 4.2 g/dL (ref 3.5–5.2)
Alkaline Phosphatase: 109 units/L (ref 39–117)
BUN: 15 mg/dL (ref 6–23)
Basophils Absolute: 0 10*3/uL (ref 0.0–0.1)
Basophils Relative: 0 % (ref 0–1)
Bilirubin Urine: NEGATIVE
Blood in Urine, dipstick: NEGATIVE
CO2: 27 meq/L (ref 19–32)
Calcium: 9.3 mg/dL (ref 8.4–10.5)
Chloride: 100 meq/L (ref 96–112)
Creatinine, Ser: 1.03 mg/dL (ref 0.40–1.20)
Eosinophils Absolute: 0.2 10*3/uL (ref 0.0–0.7)
Eosinophils Relative: 1 % (ref 0–5)
Glucose, Bld: 182 mg/dL — ABNORMAL HIGH (ref 70–99)
Glucose, Urine, Semiquant: NEGATIVE
HCT: 39.5 % (ref 36.0–46.0)
Hemoglobin: 12.8 g/dL (ref 12.0–15.0)
Ketones, urine, test strip: NEGATIVE
Lymphocytes Relative: 54 % — ABNORMAL HIGH (ref 12–46)
Lymphs Abs: 7.9 10*3/uL — ABNORMAL HIGH (ref 0.7–4.0)
MCHC: 32.4 g/dL (ref 30.0–36.0)
MCV: 88.8 fL (ref 78.0–100.0)
Monocytes Absolute: 1.1 10*3/uL — ABNORMAL HIGH (ref 0.1–1.0)
Monocytes Relative: 7 % (ref 3–12)
Neutro Abs: 5.6 10*3/uL (ref 1.7–7.7)
Neutrophils Relative %: 38 % — ABNORMAL LOW (ref 43–77)
Nitrite: NEGATIVE
Platelets: 282 10*3/uL (ref 150–400)
Potassium: 3.5 meq/L (ref 3.5–5.3)
RBC: 4.45 M/uL (ref 3.87–5.11)
RDW: 15 % (ref 11.5–15.5)
Sodium: 140 meq/L (ref 135–145)
Specific Gravity, Urine: >=1.03
Total Bilirubin: 0.3 mg/dL (ref 0.3–1.2)
Total Protein: 6.8 g/dL (ref 6.0–8.3)
Urobilinogen, UA: 0.2
WBC Urine, dipstick: NEGATIVE
WBC: 14.8 10*3/uL — ABNORMAL HIGH (ref 4.0–10.5)
pH: 5.5

## 2009-06-20 ENCOUNTER — Ambulatory Visit: Payer: Self-pay | Admitting: Family Medicine

## 2009-07-09 ENCOUNTER — Ambulatory Visit: Payer: Self-pay | Admitting: Oncology

## 2009-07-11 ENCOUNTER — Encounter: Payer: Self-pay | Admitting: Family Medicine

## 2009-07-11 LAB — COMPREHENSIVE METABOLIC PANEL
ALT: 24 U/L (ref 0–35)
AST: 20 U/L (ref 0–37)
Albumin: 4.3 g/dL (ref 3.5–5.2)
Alkaline Phosphatase: 116 U/L (ref 39–117)
BUN: 8 mg/dL (ref 6–23)
CO2: 27 mEq/L (ref 19–32)
Calcium: 9.3 mg/dL (ref 8.4–10.5)
Chloride: 106 mEq/L (ref 96–112)
Creatinine, Ser: 0.82 mg/dL (ref 0.40–1.20)
Glucose, Bld: 128 mg/dL — ABNORMAL HIGH (ref 70–99)
Potassium: 4 mEq/L (ref 3.5–5.3)
Sodium: 142 mEq/L (ref 135–145)
Total Bilirubin: 0.5 mg/dL (ref 0.3–1.2)
Total Protein: 6.9 g/dL (ref 6.0–8.3)

## 2009-07-11 LAB — CBC WITH DIFFERENTIAL/PLATELET
BASO%: 0.1 % (ref 0.0–2.0)
Basophils Absolute: 0 10*3/uL (ref 0.0–0.1)
EOS%: 1.1 % (ref 0.0–7.0)
Eosinophils Absolute: 0.1 10*3/uL (ref 0.0–0.5)
HCT: 37.8 % (ref 34.8–46.6)
HGB: 12.7 g/dL (ref 11.6–15.9)
LYMPH%: 53.9 % — ABNORMAL HIGH (ref 14.0–49.7)
MCH: 29.9 pg (ref 25.1–34.0)
MCHC: 33.7 g/dL (ref 31.5–36.0)
MCV: 88.8 fL (ref 79.5–101.0)
MONO#: 0.9 10*3/uL (ref 0.1–0.9)
MONO%: 7.3 % (ref 0.0–14.0)
NEUT#: 4.8 10*3/uL (ref 1.5–6.5)
NEUT%: 37.6 % — ABNORMAL LOW (ref 38.4–76.8)
Platelets: 250 10*3/uL (ref 145–400)
RBC: 4.26 10*6/uL (ref 3.70–5.45)
RDW: 15.2 % — ABNORMAL HIGH (ref 11.2–14.5)
WBC: 12.9 10*3/uL — ABNORMAL HIGH (ref 3.9–10.3)
lymph#: 6.9 10*3/uL — ABNORMAL HIGH (ref 0.9–3.3)

## 2009-07-11 LAB — LACTATE DEHYDROGENASE: LDH: 140 U/L (ref 94–250)

## 2009-07-11 LAB — URIC ACID: Uric Acid, Serum: 3.9 mg/dL (ref 2.4–7.0)

## 2009-07-22 ENCOUNTER — Ambulatory Visit: Payer: Self-pay | Admitting: Family Medicine

## 2009-07-22 ENCOUNTER — Encounter: Payer: Self-pay | Admitting: Family Medicine

## 2009-07-22 LAB — CONVERTED CEMR LAB
Cholesterol: 208 mg/dL — ABNORMAL HIGH (ref 0–200)
HDL: 34 mg/dL — ABNORMAL LOW (ref >39)
LDL Cholesterol: 136 mg/dL — ABNORMAL HIGH (ref 0–99)
Total CHOL/HDL Ratio: 6.1
Triglycerides: 191 mg/dL — ABNORMAL HIGH (ref <150)
VLDL: 38 mg/dL (ref 0–40)

## 2009-08-01 ENCOUNTER — Encounter: Payer: Self-pay | Admitting: *Deleted

## 2009-08-07 ENCOUNTER — Telehealth (INDEPENDENT_AMBULATORY_CARE_PROVIDER_SITE_OTHER): Payer: Self-pay | Admitting: *Deleted

## 2009-08-25 ENCOUNTER — Ambulatory Visit: Payer: Self-pay | Admitting: Family Medicine

## 2009-08-25 LAB — CONVERTED CEMR LAB
Cholesterol, target level: 200 mg/dL
HDL goal, serum: 40 mg/dL
LDL Goal: 70 mg/dL

## 2009-09-25 ENCOUNTER — Ambulatory Visit: Payer: Self-pay | Admitting: Family Medicine

## 2009-10-09 ENCOUNTER — Ambulatory Visit: Payer: Self-pay | Admitting: Oncology

## 2009-10-13 ENCOUNTER — Encounter: Payer: Self-pay | Admitting: Family Medicine

## 2009-10-16 ENCOUNTER — Encounter: Payer: Self-pay | Admitting: Family Medicine

## 2009-10-16 LAB — CBC WITH DIFFERENTIAL/PLATELET
BASO%: 0.8 % (ref 0.0–2.0)
Basophils Absolute: 0.1 10*3/uL (ref 0.0–0.1)
EOS%: 1.2 % (ref 0.0–7.0)
Eosinophils Absolute: 0.1 10*3/uL (ref 0.0–0.5)
HCT: 39.9 % (ref 34.8–46.6)
HGB: 13.4 g/dL (ref 11.6–15.9)
LYMPH%: 57.8 % — ABNORMAL HIGH (ref 14.0–49.7)
MCH: 29.8 pg (ref 25.1–34.0)
MCHC: 33.6 g/dL (ref 31.5–36.0)
MCV: 88.8 fL (ref 79.5–101.0)
MONO#: 0.9 10*3/uL (ref 0.1–0.9)
MONO%: 7.2 % (ref 0.0–14.0)
NEUT#: 4 10*3/uL (ref 1.5–6.5)
NEUT%: 33 % — ABNORMAL LOW (ref 38.4–76.8)
Platelets: 249 10*3/uL (ref 145–400)
RBC: 4.49 10*6/uL (ref 3.70–5.45)
RDW: 14.3 % (ref 11.2–14.5)
WBC: 12 10*3/uL — ABNORMAL HIGH (ref 3.9–10.3)
lymph#: 6.9 10*3/uL — ABNORMAL HIGH (ref 0.9–3.3)

## 2009-10-16 LAB — COMPREHENSIVE METABOLIC PANEL
ALT: 16 U/L (ref 0–35)
AST: 15 U/L (ref 0–37)
Albumin: 4.3 g/dL (ref 3.5–5.2)
Alkaline Phosphatase: 117 U/L (ref 39–117)
BUN: 14 mg/dL (ref 6–23)
CO2: 29 mEq/L (ref 19–32)
Calcium: 9.2 mg/dL (ref 8.4–10.5)
Chloride: 103 mEq/L (ref 96–112)
Creatinine, Ser: 0.95 mg/dL (ref 0.40–1.20)
Glucose, Bld: 108 mg/dL — ABNORMAL HIGH (ref 70–99)
Potassium: 4.5 mEq/L (ref 3.5–5.3)
Sodium: 140 mEq/L (ref 135–145)
Total Bilirubin: 0.4 mg/dL (ref 0.3–1.2)
Total Protein: 6.9 g/dL (ref 6.0–8.3)

## 2009-10-16 LAB — LACTATE DEHYDROGENASE: LDH: 134 U/L (ref 94–250)

## 2009-11-06 ENCOUNTER — Ambulatory Visit: Payer: Self-pay | Admitting: Family Medicine

## 2009-11-11 ENCOUNTER — Ambulatory Visit: Payer: Self-pay | Admitting: Psychology

## 2009-12-03 LAB — CONVERTED CEMR LAB: Hgb A1c MFr Bld: 7 %

## 2009-12-10 ENCOUNTER — Ambulatory Visit: Payer: Self-pay | Admitting: Family Medicine

## 2009-12-10 DIAGNOSIS — R269 Unspecified abnormalities of gait and mobility: Secondary | ICD-10-CM | POA: Insufficient documentation

## 2009-12-29 ENCOUNTER — Encounter: Admission: RE | Admit: 2009-12-29 | Discharge: 2010-03-29 | Payer: Self-pay | Admitting: Family Medicine

## 2010-02-11 ENCOUNTER — Ambulatory Visit: Payer: Self-pay | Admitting: Family Medicine

## 2010-02-12 ENCOUNTER — Ambulatory Visit: Payer: Self-pay | Admitting: Oncology

## 2010-02-17 ENCOUNTER — Encounter: Payer: Self-pay | Admitting: Family Medicine

## 2010-02-17 LAB — CBC WITH DIFFERENTIAL/PLATELET
BASO%: 0.4 % (ref 0.0–2.0)
Basophils Absolute: 0.1 10*3/uL (ref 0.0–0.1)
EOS%: 1.3 % (ref 0.0–7.0)
Eosinophils Absolute: 0.2 10*3/uL (ref 0.0–0.5)
HCT: 40 % (ref 34.8–46.6)
HGB: 13.5 g/dL (ref 11.6–15.9)
LYMPH%: 52.7 % — ABNORMAL HIGH (ref 14.0–49.7)
MCH: 30.4 pg (ref 25.1–34.0)
MCHC: 33.7 g/dL (ref 31.5–36.0)
MCV: 90.3 fL (ref 79.5–101.0)
MONO#: 1.1 10*3/uL — ABNORMAL HIGH (ref 0.1–0.9)
MONO%: 7.3 % (ref 0.0–14.0)
NEUT#: 5.9 10*3/uL (ref 1.5–6.5)
NEUT%: 38.3 % — ABNORMAL LOW (ref 38.4–76.8)
Platelets: 269 10*3/uL (ref 145–400)
RBC: 4.43 10*6/uL (ref 3.70–5.45)
RDW: 14.6 % — ABNORMAL HIGH (ref 11.2–14.5)
WBC: 15.5 10*3/uL — ABNORMAL HIGH (ref 3.9–10.3)
lymph#: 8.2 10*3/uL — ABNORMAL HIGH (ref 0.9–3.3)

## 2010-02-17 LAB — COMPREHENSIVE METABOLIC PANEL
ALT: 17 U/L (ref 0–35)
AST: 13 U/L (ref 0–37)
Albumin: 4.5 g/dL (ref 3.5–5.2)
Alkaline Phosphatase: 124 U/L — ABNORMAL HIGH (ref 39–117)
BUN: 12 mg/dL (ref 6–23)
CO2: 27 mEq/L (ref 19–32)
Calcium: 9.5 mg/dL (ref 8.4–10.5)
Chloride: 102 mEq/L (ref 96–112)
Creatinine, Ser: 0.92 mg/dL (ref 0.40–1.20)
Glucose, Bld: 130 mg/dL — ABNORMAL HIGH (ref 70–99)
Potassium: 4.1 mEq/L (ref 3.5–5.3)
Sodium: 140 mEq/L (ref 135–145)
Total Bilirubin: 0.3 mg/dL (ref 0.3–1.2)
Total Protein: 7 g/dL (ref 6.0–8.3)

## 2010-02-17 LAB — LACTATE DEHYDROGENASE: LDH: 148 U/L (ref 94–250)

## 2010-02-19 ENCOUNTER — Encounter: Admission: RE | Admit: 2010-02-19 | Discharge: 2010-02-19 | Payer: Self-pay | Admitting: Oncology

## 2010-03-02 ENCOUNTER — Encounter: Payer: Self-pay | Admitting: Family Medicine

## 2010-03-11 ENCOUNTER — Encounter: Payer: Self-pay | Admitting: Family Medicine

## 2010-03-11 ENCOUNTER — Ambulatory Visit: Payer: Self-pay | Admitting: Family Medicine

## 2010-03-11 LAB — CONVERTED CEMR LAB: Hgb A1c MFr Bld: 6.9 %

## 2010-03-12 LAB — CONVERTED CEMR LAB
ALT: 21 units/L (ref 0–35)
AST: 19 units/L (ref 0–37)
Albumin: 4.1 g/dL (ref 3.5–5.2)
Alkaline Phosphatase: 116 units/L (ref 39–117)
BUN: 16 mg/dL (ref 6–23)
CO2: 28 meq/L (ref 19–32)
Calcium: 8.8 mg/dL (ref 8.4–10.5)
Chloride: 104 meq/L (ref 96–112)
Creatinine, Ser: 1.03 mg/dL (ref 0.40–1.20)
Direct LDL: 54 mg/dL
Glucose, Bld: 109 mg/dL — ABNORMAL HIGH (ref 70–99)
Potassium: 4.2 meq/L (ref 3.5–5.3)
Sodium: 142 meq/L (ref 135–145)
Total Bilirubin: 0.3 mg/dL (ref 0.3–1.2)
Total Protein: 6.3 g/dL (ref 6.0–8.3)

## 2010-04-13 ENCOUNTER — Ambulatory Visit: Payer: Self-pay | Admitting: Family Medicine

## 2010-04-21 ENCOUNTER — Ambulatory Visit: Payer: Self-pay | Admitting: Family Medicine

## 2010-05-15 ENCOUNTER — Ambulatory Visit: Payer: Self-pay | Admitting: Family Medicine

## 2010-05-18 ENCOUNTER — Encounter: Payer: Self-pay | Admitting: *Deleted

## 2010-06-08 ENCOUNTER — Ambulatory Visit: Payer: Self-pay | Admitting: Surgery

## 2010-06-08 ENCOUNTER — Encounter: Payer: Self-pay | Admitting: Family Medicine

## 2010-06-16 ENCOUNTER — Ambulatory Visit: Payer: Self-pay | Admitting: Oncology

## 2010-06-16 ENCOUNTER — Encounter: Admission: RE | Admit: 2010-06-16 | Discharge: 2010-06-16 | Payer: Self-pay | Admitting: Surgery

## 2010-06-18 ENCOUNTER — Encounter: Payer: Self-pay | Admitting: Family Medicine

## 2010-06-18 LAB — CBC WITH DIFFERENTIAL/PLATELET
BASO%: 0.3 % (ref 0.0–2.0)
Basophils Absolute: 0 10*3/uL (ref 0.0–0.1)
EOS%: 1.4 % (ref 0.0–7.0)
Eosinophils Absolute: 0.2 10*3/uL (ref 0.0–0.5)
HCT: 38.9 % (ref 34.8–46.6)
HGB: 12.8 g/dL (ref 11.6–15.9)
LYMPH%: 58.8 % — ABNORMAL HIGH (ref 14.0–49.7)
MCH: 29.5 pg (ref 25.1–34.0)
MCHC: 32.8 g/dL (ref 31.5–36.0)
MCV: 89.7 fL (ref 79.5–101.0)
MONO#: 0.9 10*3/uL (ref 0.1–0.9)
MONO%: 6.8 % (ref 0.0–14.0)
NEUT#: 4.5 10*3/uL (ref 1.5–6.5)
NEUT%: 32.7 % — ABNORMAL LOW (ref 38.4–76.8)
Platelets: 280 10*3/uL (ref 145–400)
RBC: 4.34 10*6/uL (ref 3.70–5.45)
RDW: 14.6 % — ABNORMAL HIGH (ref 11.2–14.5)
WBC: 13.7 10*3/uL — ABNORMAL HIGH (ref 3.9–10.3)
lymph#: 8.1 10*3/uL — ABNORMAL HIGH (ref 0.9–3.3)

## 2010-06-18 LAB — COMPREHENSIVE METABOLIC PANEL
ALT: 10 U/L (ref 0–35)
AST: 12 U/L (ref 0–37)
Albumin: 4.1 g/dL (ref 3.5–5.2)
Alkaline Phosphatase: 121 U/L — ABNORMAL HIGH (ref 39–117)
BUN: 7 mg/dL (ref 6–23)
CO2: 27 mEq/L (ref 19–32)
Calcium: 9.1 mg/dL (ref 8.4–10.5)
Chloride: 106 mEq/L (ref 96–112)
Creatinine, Ser: 0.89 mg/dL (ref 0.40–1.20)
Glucose, Bld: 129 mg/dL — ABNORMAL HIGH (ref 70–99)
Potassium: 3.7 mEq/L (ref 3.5–5.3)
Sodium: 141 mEq/L (ref 135–145)
Total Bilirubin: 0.4 mg/dL (ref 0.3–1.2)
Total Protein: 6.7 g/dL (ref 6.0–8.3)

## 2010-06-18 LAB — LACTATE DEHYDROGENASE: LDH: 120 U/L (ref 94–250)

## 2010-07-20 ENCOUNTER — Ambulatory Visit: Payer: Self-pay | Admitting: Surgery

## 2010-08-20 ENCOUNTER — Ambulatory Visit: Payer: Self-pay | Admitting: Family Medicine

## 2010-08-20 LAB — CONVERTED CEMR LAB: Hgb A1c MFr Bld: 6.6 %

## 2010-09-02 ENCOUNTER — Encounter: Payer: Self-pay | Admitting: Family Medicine

## 2010-09-24 ENCOUNTER — Encounter: Payer: Self-pay | Admitting: Family Medicine

## 2010-09-24 ENCOUNTER — Ambulatory Visit: Payer: Self-pay | Admitting: Family Medicine

## 2010-09-24 DIAGNOSIS — G44219 Episodic tension-type headache, not intractable: Secondary | ICD-10-CM | POA: Insufficient documentation

## 2010-09-24 LAB — CONVERTED CEMR LAB
BUN: 12 mg/dL (ref 6–23)
CO2: 31 meq/L (ref 19–32)
Calcium: 9.3 mg/dL (ref 8.4–10.5)
Chloride: 105 meq/L (ref 96–112)
Creatinine, Ser: 0.87 mg/dL (ref 0.40–1.20)
Direct LDL: 62 mg/dL
Glucose, Bld: 103 mg/dL — ABNORMAL HIGH (ref 70–99)
Potassium: 3.9 meq/L (ref 3.5–5.3)
Sodium: 143 meq/L (ref 135–145)

## 2010-10-13 ENCOUNTER — Ambulatory Visit: Payer: Self-pay | Admitting: Family Medicine

## 2010-10-21 ENCOUNTER — Encounter: Payer: Self-pay | Admitting: *Deleted

## 2010-10-21 ENCOUNTER — Ambulatory Visit: Payer: Self-pay | Admitting: Family Medicine

## 2010-11-09 ENCOUNTER — Ambulatory Visit: Payer: Self-pay | Admitting: Family Medicine

## 2010-11-13 ENCOUNTER — Encounter: Payer: Self-pay | Admitting: Family Medicine

## 2010-11-27 ENCOUNTER — Ambulatory Visit (HOSPITAL_COMMUNITY)
Admission: RE | Admit: 2010-11-27 | Discharge: 2010-11-27 | Payer: Self-pay | Source: Home / Self Care | Admitting: Cardiovascular Disease

## 2010-12-11 ENCOUNTER — Encounter: Payer: Self-pay | Admitting: Family Medicine

## 2010-12-15 ENCOUNTER — Ambulatory Visit: Payer: Self-pay

## 2010-12-16 ENCOUNTER — Ambulatory Visit: Payer: Self-pay | Admitting: Oncology

## 2011-01-18 ENCOUNTER — Ambulatory Visit: Admit: 2011-01-18 | Payer: Self-pay | Admitting: Surgery

## 2011-01-26 NOTE — Consult Note (Signed)
Summary: [facility]  [facility]   Imported By: Clydell Hakim 07/01/2010 14:02:15  _____________________________________________________________________  External Attachment:    Type:   Image     Comment:   External Document

## 2011-01-26 NOTE — Progress Notes (Signed)
Summary: handicap form  Phone Note Call from Patient Call back at Home Phone [phone]   Reason for Call: Talk to Doctor Summary of Call: pt sts she just left and she forgot to her the handicap form back from md, wants to know if she can pick it up tom.? Initial call taken by: ERIN LEVAN,  December 12, 2007 4:43 PM  Follow-up for Phone Call        spoke with Dr. Luz Brazen and she will fill out temporary handicap form not a permanent form. pt notified that she may pick up tomorrow. Follow-up by: Theresia Lo RN,  December 12, 2007 4:57 PM

## 2011-01-26 NOTE — Assessment & Plan Note (Signed)
Summary: F/U BREAST CA PER [facility]   Vital Signs:  Patient profile:   65 year old female Height:      61 inches Weight:      151.4 pounds BMI:     28.71 Temp:     98.0 degrees F oral Pulse rate:   77 / minute Pulse rhythm:   regular BP sitting:   138 / 81  (left arm)  Vitals Entered By: Modesta Messing LPN (April 16, 2009 8:56 AM)  CC: breast cancer, HTN Pain Assessment Patient in pain? no        History of Present Illness: Patient presents to discuss the following:  1. Lymphoma: In November patient had a mass found on routine mammogram and underwent biopsy that revealed lymphoma.  She was referred to cancer center but was unable to keep appointment due to finances.  Her husband is here for support, but he admits he cannot discuss the cancer diagnosis with his wife because it is too scary and painful.  She continues to have numerous stressors at home.  2. HTN:  recently seen for low back pain, and at this visit her BP was 200/90s.  She was out of two of her medications. She has had these refilled and her BP is 138/81 today.  Denies CP, SOB, edema.    Allergies: 1)  ! Aspirin  Physical Exam  General:  anxious appearing, tearful. Lungs:  normal respiratory effort, normal breath sounds, no fremitus, no crackles, and no wheezes.   Heart:  normal rate, regular rhythm, no murmur, no gallop, and no rub.     Impression & Recommendations:  Problem # 1:  LYMPHOMA (ICD-202.80) Assessment Deteriorated needs urgent referral to oncology for further treatment and work up, as it has been 5 months since her diagnosis by biopsy. Orders: Oncology Referral (Oncology) [facility]- Est Level  3 ([address])  Problem # 2:  HYPERTENSION, BENIGN SYSTEMIC (ICD-401.1) Assessment: Improved  controlled on meds.  Discussed need to continue all meds, and to call as soon as refill needed.  Discussed with husband need for stress reduction and help for patient to take care of herself Her updated medication  list for this problem includes:    Enalapril Maleate 20 Mg Tabs (Enalapril maleate) .Marland Kitchen... Take 1 tablet by mouth once a day    Hydrochlorothiazide 25 Mg Tabs (Hydrochlorothiazide) .Marland Kitchen... Take 1 tablet by mouth every morning    Coreg 6.25 Mg Tabs (Carvedilol) ..... One tab by mouth bid    Norvasc 10 Mg Tabs (Amlodipine besylate) .Marland Kitchen... 1 by mouth daily  Orders: [facility]- Est Level  3 (60454)  Complete Medication List: 1)  Advair Diskus 500-50 Mcg/dose Misc (Fluticasone-salmeterol) .... Inhale 1 puff as directed twice a day 2)  Albuterol 90 Mcg/act Aers (Albuterol) .... Inhale 2 puff as directed four times a day 3)  Enalapril Maleate 20 Mg Tabs (Enalapril maleate) .... Take 1 tablet by mouth once a day 4)  Flonase 50 Mcg/act Susp (Fluticasone propionate) .... Spray 1 spray into both nostrils once a day 5)  Hydrochlorothiazide 25 Mg Tabs (Hydrochlorothiazide) .... Take 1 tablet by mouth every morning 6)  Singulair 10 Mg Tabs (Montelukast sodium) .... Take 1 tablet by mouth at bedtime 7)  Coreg 6.25 Mg Tabs (Carvedilol) .... One tab by mouth bid 8)  Ultram 50 Mg Tabs (Tramadol hcl) .... 2 tabs three times a day as needed pain 9)  Flexeril 10 Mg Tabs (Cyclobenzaprine hcl) .... One three times a day prn 10)  Zocor 80 Mg Tabs (Simvastatin) .Marland Kitchen.. 1 by mouth nightly 11)  Plavix 75 Mg Tabs (Clopidogrel bisulfate) .Marland Kitchen.. 1 by mouth daily 12)  Norvasc 10 Mg Tabs (Amlodipine besylate) .Marland Kitchen.. 1 by mouth daily  Patient Instructions: 1)  Please schedule a follow-up appointment in 2 weeks with Dr. Luz Brazen. 2)  We will call you with new appointment at the cancer center. 3)  They can also help you with coping with this diagnosis. 4)  Please call us if you need to see me sooner than 2 weeks. 5)  Try your best to focus on your own health right now, and be sure to call as soon as you run out of a medication.

## 2011-01-26 NOTE — Assessment & Plan Note (Signed)
Summary: DNKA   DPG   

## 2011-01-26 NOTE — Assessment & Plan Note (Signed)
Summary: fu/eo   Vital Signs:  Patient Profile:   65 Years Old Female Height:     62 inches Weight:      153.8 pounds BMI:     28.23 Temp:     97.2 degrees F Pulse rate:   71 / minute BP sitting:   148 / 86  (right arm)  Pt. in pain?   yes    Location:   shoulder    Intensity:   8  Vitals Entered By: Starleen Blue RN (June 03, 2008 11:40 AM)                  PCP:  Levander Campion MD  Chief Complaint:  f/u HTN.  History of Present Illness: Ms. Sommerfeld is here to follow up HTN.   She states she is taking all of her antihypertensives, however, she has brought her medications with her, and several of the bottles are from december and still full, and she is missing her lisinopril.  She does seem somewhat confused about what medications she is taking and what they are for.  She took her HCTZ this AM for sure, but likely not her coreg or lisinopril.  She denies chest pain, shortness of breath, edema, headache.  Not exercising or watching her diet.  Still smoking     Current Allergies: ! ASPIRIN      Physical Exam  General:     Well-developed,well-nourished,in no acute distress; alert,appropriate and cooperative throughout examination Neck:     no jvd  no bruits Lungs:     Normal respiratory effort, chest expands symmetrically. Lungs are clear to auscultation, no crackles or wheezes. Heart:     Normal rate and regular rhythm. S1 and S2 normal without gallop, murmur, click, rub or other extra sounds. Pulses:     1+ DP pulses bilaterally, feet WWP Extremities:     No clubbing, cyanosis, edema, or deformity noted     Impression & Recommendations:  Problem # 1:  HYPERTENSION, BENIGN SYSTEMIC (ICD-401.1) Assessment: Unchanged Suspect patient is confused about her meds and has not been filling them recently--printed out a list of her medications that is updated and listed what they are for and how to take them.  Also sent in new set of refills for each med.  Suspect  main barrier to compliance is stress over taking care of grandchildren and neglecting herself.  Stressed importance of caring for herself so that she can continue to care for others.  Encouraged smoking cessation, exercises, and dietary changes.  Patient is to follow up with a nurse visit to check her blood pressure once she has been on all of her medications as prescribed x one week.  We will adjust her meds from there. Her updated medication list for this problem includes:    Enalapril Maleate 20 Mg Tabs (Enalapril maleate) .Marland Kitchen... Take 1 tablet by mouth once a day    Hydrochlorothiazide 25 Mg Tabs (Hydrochlorothiazide) .Marland Kitchen... Take 1 tablet by mouth every morning    Coreg 6.25 Mg Tabs (Carvedilol) ..... One tab by mouth bid  Orders: [facility]- Est Level  3 (30160)   Complete Medication List: 1)  Advair Diskus 500-50 Mcg/dose Misc (Fluticasone-salmeterol) .... Inhale 1 puff as directed twice a day 2)  Albuterol 90 Mcg/act Aers (Albuterol) .... Inhale 2 puff as directed four times a day 3)  Enalapril Maleate 20 Mg Tabs (Enalapril maleate) .... Take 1 tablet by mouth once a day 4)  Flonase 50 Mcg/act Susp (  Fluticasone propionate) .... Spray 1 spray into both nostrils once a day 5)  Hydrochlorothiazide 25 Mg Tabs (Hydrochlorothiazide) .... Take 1 tablet by mouth every morning 6)  Singulair 10 Mg Tabs (Montelukast sodium) .... Take 1 tablet by mouth at bedtime 7)  Coreg 6.25 Mg Tabs (Carvedilol) .... One tab by mouth bid 8)  Ultram 50 Mg Tabs (Tramadol hcl) .... One to two tabs by mouth q 6 hrs as needed pain 9)  Flexeril 10 Mg Tabs (Cyclobenzaprine hcl) .Marland Kitchen.. 1 tab po at bedtime for muscle spasm.  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 1 by mouth nightly 11)  Anacin 81 Mg Tbec (Aspirin) .Marland Kitchen.. 1 by mouth daily 12)  Plavix 75 Mg Tabs (Clopidogrel bisulfate) .Marland Kitchen.. 1 by mouth daily  Other Orders: Future Orders: Lipid-[facility] ([id number]) ...  05/30/2009 Comp Met-[facility] ([id number]) ... 05/29/2009   Patient Instructions: 1)  Make a nurse visit appointment to check your blood pressure once you have been on your full regimen for about one week. 2)  Make a fasting lab appointment to check your cholesterol. 3)  Make sure you call and follow up with your vascular surgeon. 4)  Good luck with PT! 5)  And congrats on cutting down the smoking--keep going!   Prescriptions: ZOCOR 80 MG  TABS (SIMVASTATIN) 1 by mouth nightly  #31 x 3   Entered and Authorized by:   Levander Campion MD   Signed by:   Levander Campion MD on 06/03/2008   Method used:   Faxed to ...       [facility] [phone]*       [address], [place]  [address]       Ph: [phone] or [phone]       Fax: [phone]   RxID:   [id number] FLEXERIL 10 MG TABS (CYCLOBENZAPRINE HCL) 1 tab po at bedtime for muscle spasm.  (do not drive or operate machinary when taking this med.)  #15 x 2   Entered and Authorized by:   Levander Campion MD   Signed by:   Levander Campion MD on 06/03/2008   Method used:   Faxed to ...       [facility] [phone]*       [address], [place]  [address]       Ph: [phone] or [phone]       Fax: [phone]   RxID:   [id number] COREG 6.25 MG  TABS (CARVEDILOL) One tab by mouth BID  #62 x 11   Entered and Authorized by:   Levander Campion MD   Signed by:   Levander Campion MD on 06/03/2008   Method used:   Faxed to ...       [facility]. [phone]*       [address], [place]  [address]       Ph: [phone] or [phone]       Fax: [phone]   RxID:   [id number] SINGULAIR 10 MG TABS (MONTELUKAST SODIUM) Take 1 tablet by mouth at bedtime  #31 x 11   Entered and Authorized by:   Levander Campion MD   Signed by:   Levander Campion MD on 06/03/2008   Method used:   Faxed to .Marland KitchenMarland Kitchen  [facility]. [phone]*       [address],  [place]  [address]       Ph: [phone] or [phone]       Fax: [phone]   RxID:   [id number] HYDROCHLOROTHIAZIDE 25 MG TABS (HYDROCHLOROTHIAZIDE) Take 1 tablet by mouth every morning  #31 x 11   Entered and Authorized by:   Levander Campion MD   Signed by:   Levander Campion MD on 06/03/2008   Method used:   Faxed to ...       [facility] [phone]*       [address], [place]  [address]       Ph: [phone] or [phone]       Fax: [phone]   RxID:   [id number] FLONASE 50 MCG/ACT SUSP (FLUTICASONE PROPIONATE) Spray 1 spray into both nostrils once a day  #16 Gram x 11   Entered and Authorized by:   Levander Campion MD   Signed by:   Levander Campion MD on 06/03/2008   Method used:   Faxed to ...       [facility] [phone]*       [address] [place]  [address]       Ph: [phone] or [phone]       Fax: [phone]   RxID:   [id number] ENALAPRIL MALEATE 20 MG TABS (ENALAPRIL MALEATE) Take 1 tablet by mouth once a day  #31 x 11   Entered and Authorized by:   Levander Campion MD   Signed by:   Levander Campion MD on 06/03/2008   Method used:   Faxed to ...       [facility]. [phone]*       [address], [place]  [address]       Ph: [phone] or [phone]       Fax: [phone]   RxID:   [id number] ALBUTEROL 90 MCG/ACT AERS (ALBUTEROL) Inhale 2 puff as directed four times a day  #1 x 11   Entered and Authorized by:   Levander Campion MD   Signed by:   Levander Campion MD on 06/03/2008   Method used:   Faxed to ...       [facility] [phone]*       [address], [place]  [address]       Ph: [phone] or [phone]       Fax: [phone]   RxID:   [id number] ADVAIR DISKUS 500-50 MCG/DOSE MISC (FLUTICASONE-SALMETEROL) Inhale 1 puff as directed twice a day  #1 x 11   Entered and Authorized by:   Levander Campion MD   Signed by:   Levander Campion MD on  06/03/2008   Method used:   Faxed to ...       [facility] [phone]*       [address], [place]  [address]       Ph: [phone] or [phone]       Fax: [phone]   RxID:   [id number] PLAVIX 75 MG  TABS (CLOPIDOGREL BISULFATE) 1 by mouth daily  #31 x 6   Entered and Authorized by:  Levander Campion MD   Signed by:   Levander Campion MD on 06/03/2008   Method used:   Faxed to ...       [facility] [phone]*       [address]       [place], [place]  [address]       Ph: [phone] or [phone]       Fax: [phone]   RxID:   [id number]  ]
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
## 2011-01-26 NOTE — Assessment & Plan Note (Signed)
Summary: meet new md/eo   Vital Signs:  Patient profile:   65 year old female Height:      61.5 inches Weight:      150.8 pounds BMI:     28.13 Pulse rate:   62 / minute BP sitting:   124 / 79  (left arm)  Vitals Entered By: Arlyss Repress CMA, (July 22, 2009 11:09 AM) CC: f/up DM. feels like off balance x 2 months Is Patient Diabetic? Yes  Pain Assessment Patient in pain? no        Primary Care Provider:  Eustaquio Boyden  MD  CC:  f/up DM. feels like off balance x 2 months.  History of Present Illness: CC: meet new MD, f/u DM  1. DM - new dx.  [facility]  States fasting cbgs have been 80s-130s.  States some as low as 40, and doesn't feel well with these so eats food.  On review of glucometer, no low readings found.  Discussed sxs of hypoglycemia and now imbalance can be from low sugars.  2. HTN - compliant with meds.  tolerant of meds, no CP, tightness, SOB.  endorses some swelling but stopped HCTZ 2/2 concern with dehydration last visit.  3. HLD - compliant with Zocor.  4. CAD - nonobstructive CAD by last cath, but pt on plavix.  thinks she may have had stent placed - reviewing records, pt with popliteal stent for PAD.  5. COPD - compliant with alb, singulair and advair.  6. CLL - h/o CLL found incidentally after breast mass.  pt doesn't think she has CLL, but thinks rather she has breast cancer which is stable.  Is to f/u on mammogram.  7. preventative med - needs colonoscopy, prefers to do stool cards.  Habits & Providers  Alcohol-Tobacco-Diet     Tobacco Status: current     Tobacco Counseling: to quit use of tobacco products     Cigarette Packs/Day: 0.5  Current Medications (verified): 1)  Advair Diskus 500-50 Mcg/dose Misc (Fluticasone-Salmeterol) .... Inhale 1 Puff As Directed Twice A Day 2)  Albuterol 90 Mcg/act Aers (Albuterol) .... Inhale 2 Puff As Directed Four Times A Day 3)  Enalapril Maleate 20 Mg Tabs (Enalapril  Maleate) .... Take 1 Tablet By Mouth Once A Day 4)  Flonase 50 Mcg/act Susp (Fluticasone Propionate) .... Spray 1 Spray Into Both Nostrils Once A Day 5)  Singulair 10 Mg Tabs (Montelukast Sodium) .... Take 1 Tablet By Mouth At Bedtime 6)  Coreg 6.25 Mg  Tabs (Carvedilol) .... One Tab By Mouth Bid 7)  Ultram 50 Mg  Tabs (Tramadol Hcl) .... 2 Tabs Three Times A Day As Needed Pain 8)  Flexeril 10 Mg Tabs (Cyclobenzaprine Hcl) .... One Three Times A Day Prn 9)  Zocor 80 Mg  Tabs (Simvastatin) .Marland Kitchen.. 1 By Mouth Nightly 10)  Plavix 75 Mg  Tabs (Clopidogrel Bisulfate) .Marland Kitchen.. 1 By Mouth Daily 11)  Norvasc 10 Mg Tabs (Amlodipine Besylate) .Marland Kitchen.. 1 By Mouth Daily 12)  Cvs Nicotine Polacrilex 2 Mg Gum (Nicotine Polacrilex) .Marland Kitchen.. 1 Piece Every 4 Hours As Needed For Cigarette Cravings Disp: Qs X One Month 13)  Metformin Hcl 500 Mg Tabs (Metformin Hcl) .Marland Kitchen.. 1 By Mouth Daily X 5 Days, Then 1 By Mouth Two Times A Day 14)  Onetouch Ultrasoft Lancets  Misc (Lancets) .... Use As Directed 15)  Onetouch Ultra Test  Strp (Glucose Blood) .... Use As Directed, Qs 1 Month  Allergies (verified):  1)  ! Aspirin  Past History:  Past Medical History: Last updated: 07/02/2009  brain tumor - meningioma removed 1998-[facility], H/o dysphagia--resolved with esoph dilation  R CVA s/p meningioma removal Current Problems:  DIABETES MELLITUS, TYPE II (ICD-250.00) --> [facility] LEUKEMIA, LYMPHOCYTIC, CHRONIC (ICD-204.10) LOW BACK PAIN SYNDROME, SEVERE (ICD-724.2) ADJUSTMENT DISORDER WITH DEPRESSED MOOD (ICD-309.0) ROUTINE GYNECOLOGICAL EXAMINATION (ICD-V72.31) SPECIAL SCREENING FOR MALIGNANT NEOPLASMS COLON (ICD-V76.51) SCREENING FOR MALIGNANT NEOPLASM OF THE CERVIX (ICD-V76.2) PERIPHERAL VASCULAR DISEASE (ICD-443.9) TOBACCO DEPENDENCE (ICD-305.1) HYPERTENSION, BENIGN SYSTEMIC (ICD-401.1) HYPERCHOLESTEROLEMIA (ICD-272.0) GASTROESOPHAGEAL REFLUX, NO ESOPHAGITIS  (ICD-530.81) COPD (ICD-496) ANXIETY (ICD-300.00)  Family History: Last updated: 02/23/2007 `diabetes in several family members`, Brother--IDDM, Father--unknown (was not involved with her), Mother--deceased 05-21-2004; CAD, Unsure about other family history  Social History: Last updated: 03/09/2007 1/2 ppd x >20 years, down to 2/day in 2/05; Lives with husband, 3 grandchildren in house; Denies alcohol, illicit drugs Back to 1 ppd.  Brother passed away 21-Feb-2007, sister had a stroke.  Stress with grandchildren. Married  Past Surgical History: Cardiac Cath: normal coronary arteries 02/24/2002 Cholecystectomy - 03/27/2002 EGD: esophageal dilation (Dr. Madilyn Fireman); esophagus was diffusely dilated distally w/ abrupt taper into LES-?achalasia - 01/28/2004 Spirometry: mod obstr dz w/ mod improvement post-bronchodilator - 03/27/2004 Bypass grafting of RLE for PAD claudication - SFA patent with popliteal stent in place (12/28/2006) Cardiac Cath: normal CA 04/05/2007  Social History: Packs/Day:  0.5  Physical Exam  General:  Alert, sitting upright and active appearing Lungs:  normal wob.  end exp wheezing. Heart:  normal S1, S2, no m/r/g Pulses:  2+ periph pulses Extremities:  no edema Skin:  Intact without suspicious lesions or rashes  Diabetes Management Exam:    Foot Exam (with socks and/or shoes not present):       Sensory-Monofilament:          Left foot: normal          Right foot: abnormal   Impression & Recommendations:  Problem # 1:  DIABETES MELLITUS, TYPE II (ICD-250.00) Assessment Unchanged  advised to only check sugars once daily or if feeling poorly.  keep log of fasting sugarsa nd bring to next appt.  needs eye MD appt - referral made today. Her updated medication list for this problem includes:    Enalapril Maleate 20 Mg Tabs (Enalapril maleate) .Marland Kitchen... Take 1 tablet by mouth once a day    Metformin Hcl 500 Mg Tabs (Metformin hcl) .Marland Kitchen... 1 by mouth daily x 5 days, then 1 by mouth two times a  day  Labs Reviewed: Creat: 1.03 (06/16/2009)    Reviewed HgBA1c results: 7.5 (06/02/2009)  6.4 (03/09/2007)  Orders: [facility]- Est  Level 4 (16109) Ophthalmology Referral (Ophthalmology)  Problem # 2:  HYPERTENSION, BENIGN SYSTEMIC (ICD-401.1) Assessment: Improved  stable.  restarted HCTZ at 1/2 previous dose 2/2 pt complaining of retaining fluid. Her updated medication list for this problem includes:    Enalapril Maleate 20 Mg Tabs (Enalapril maleate) .Marland Kitchen... Take 1 tablet by mouth once a day    Coreg 6.25 Mg Tabs (Carvedilol) ..... One tab by mouth bid    Norvasc 10 Mg Tabs (Amlodipine besylate) .Marland Kitchen... 1 by mouth daily  BP today: 124/79 Prior BP: 140/79 (06/20/2009)  Prior 10 Yr Risk Heart Disease: Not enough information (03/09/2007)  Labs Reviewed: K+: 3.5 (06/16/2009) Creat: : 1.03 (06/16/2009)   Chol: 238 (06/12/2008)   HDL: 36 (06/12/2008)   LDL: 126 (06/12/2008)   TG: 379 (06/12/2008)  Orders:  [facility]- Est  Level 4 (16109) Ophthalmology Referral (Ophthalmology)  Problem # 3:  HYPERCHOLESTEROLEMIA (ICD-272.0)  check FLP. Her updated medication list for this problem includes:    Zocor 80 Mg Tabs (Simvastatin) .Marland Kitchen... 1 by mouth nightly  Orders: Lipid-[facility] (60454-09811) [facility]- Est  Level 4 (91478)  Labs Reviewed: SGOT: 13 (06/16/2009)   SGPT: 10 (06/16/2009)  Prior 10 Yr Risk Heart Disease: Not enough information (03/09/2007)   HDL:36 (06/12/2008)  LDL:126 (06/12/2008)  Chol:238 (06/12/2008)  Trig:379 (06/12/2008)  Problem # 4:  LEUKEMIA, LYMPHOCYTIC, CHRONIC (ICD-204.10) patient had mammogram in fall 2009, found lymph node, biopsied, and found to be consistent with CLL.  Sees Dr. Arline Asp with heme/onc.  They are following her labs for right now.    Problem # 5:  TOBACCO DEPENDENCE (ICD-305.1)  Her updated medication list for this problem includes:    Cvs Nicotine Polacrilex 2 Mg Gum (Nicotine polacrilex) .Marland Kitchen... 1 piece every 4 hours as needed for cigarette cravings disp: qs  x one month  Encouraged smoking cessation and discussed different methods for smoking cessation.   Orders: [facility]- Est  Level 4 (29562)  Problem # 6:  COPD (ICD-496)  Her updated medication list for this problem includes:    Advair Diskus 500-50 Mcg/dose Misc (Fluticasone-salmeterol) ..... Inhale 1 puff as directed twice a day    Albuterol 90 Mcg/act Aers (Albuterol) ..... Inhale 2 puff as directed four times a day    Singulair 10 Mg Tabs (Montelukast sodium) .Marland Kitchen... Take 1 tablet by mouth at bedtime  Pulmonary Functions Reviewed: O2 sat: 94 (12/12/2007)     Vaccines Reviewed: Pneumovax: Done. (10/27/2001)   Flu Vax: Fluvax MCR (10/01/2008)  Problem # 7:  Preventive Health Care (ICD-V70.0) stool cards provided today.  Problem # 8:  PERIPHERAL VASCULAR DISEASE (ICD-443.9)  Her updated medication list for this problem includes:    Plavix 75 Mg Tabs (Clopidogrel bisulfate) .Marland Kitchen... 1 by mouth daily  severe.  has had revascularization surgery.  continue to encourage smokingcessation.  Complete Medication List: 1)  Advair Diskus 500-50 Mcg/dose Misc (Fluticasone-salmeterol) .... Inhale 1 puff as directed twice a day 2)  Albuterol 90 Mcg/act Aers (Albuterol) .... Inhale 2 puff as directed four times a day 3)  Enalapril Maleate 20 Mg Tabs (Enalapril maleate) .... Take 1 tablet by mouth once a day 4)  Flonase 50 Mcg/act Susp (Fluticasone propionate) .... Spray 1 spray into both nostrils once a day 5)  Singulair 10 Mg Tabs (Montelukast sodium) .... Take 1 tablet by mouth at bedtime 6)  Coreg 6.25 Mg Tabs (Carvedilol) .... One tab by mouth bid 7)  Ultram 50 Mg Tabs (Tramadol hcl) .... 2 tabs three times a day as needed pain 8)  Flexeril 10 Mg Tabs (Cyclobenzaprine hcl) .... One three times a day prn 9)  Zocor 80 Mg Tabs (Simvastatin) .Marland Kitchen.. 1 by mouth nightly 10)  Plavix 75 Mg Tabs (Clopidogrel bisulfate) .Marland Kitchen.. 1 by mouth daily 11)  Norvasc 10 Mg Tabs (Amlodipine besylate) .Marland Kitchen.. 1 by mouth  daily 12)  Cvs Nicotine Polacrilex 2 Mg Gum (Nicotine polacrilex) .Marland Kitchen.. 1 piece every 4 hours as needed for cigarette cravings disp: qs x one month 13)  Metformin Hcl 500 Mg Tabs (Metformin hcl) .Marland Kitchen.. 1 by mouth daily x 5 days, then 1 by mouth two times a day 14)  Onetouch Ultrasoft Lancets Misc (Lancets) .... Use as directed 15)  Onetouch Ultra Test Strp (Glucose blood) .... Use as directed, qs 1 month  Other Orders: Hemoccult Cards (  Take Home) (Hemoccult Cards)  Patient Instructions: 1)  Please return in 2-3 months for follow up. 2)  We have given you stool cards today.  Please return in the next few weeks. 3)  Make sure to follow up regarding your mammogram.  4)  Decreased your Hydrochlorothiazide to 1/2 pill daily (12.5mg  instead of 25mg ). 5)  We have checked blood work today. 6)  I have sent a refill for your strips and needles.  Only check sugars once daily (fasting in morning) and bring log to your next visit. 7)  Call clinic with questions. Prescriptions: ONETOUCH ULTRASOFT LANCETS  MISC (LANCETS) use as directed  #1 x 11   Entered and Authorized by:   Eustaquio Boyden  MD   Signed by:   Eustaquio Boyden  MD on 07/22/2009   Method used:   Electronically to        [facility] [phone]* (retail)       [address] [place]  [address]       Ph: [phone] or [phone]       Fax: [phone]   RxID:   [id number] [place] TEST  STRP (GLUCOSE BLOOD) use as directed, QS 1 month  #1 x 11   Entered and Authorized by:   Eustaquio Boyden  MD   Signed by:   Eustaquio Boyden  MD on 07/22/2009   Method used:   Electronically to        [facility] [phone][facility] (retail)       [address], [place]  [address]       Ph: [phone] or [phone]       Fax: [phone]   RxID:   [id number]    Prevention & Chronic Care Immunizations   Influenza vaccine: Fluvax MCR  (10/01/2008)   Influenza vaccine due: 10/01/2009    Tetanus booster:  09/26/2002: Done.   Tetanus booster due: 09/26/2012    Pneumococcal vaccine: Done.  (10/27/2001)   Pneumococcal vaccine due: None    H. zoster vaccine: Not documented  Colorectal Screening   Hemoccult: Done.  (02/24/1997)   Hemoccult action/deferral: Ordered  (07/22/2009)   Hemoccult due: 02/24/1998    Colonoscopy: Not documented   Colonoscopy action/deferral: Deferred  (07/22/2009)  Other Screening   Pap smear: normal  (10/01/2008)   Pap smear due: 09/2011    Mammogram: BI-RADS CATEGORY 4:  Suspicious abnormality - biopsy should be^MM DIGITAL DIAG LTD R  (10/22/2008)   Mammogram due: 01/27/2005    DXA bone density scan: Not documented    Smoking status: current  (07/22/2009)   Smoking cessation counseling: yes  (12/05/2008)  Diabetes Mellitus   HgbA1C: 7.5  (06/02/2009)   Hemoglobin A1C due: 06/09/2007    Eye exam: Not documented   Diabetic eye exam action/deferral: Ophthalmology referral  (07/22/2009)    Foot exam: yes  (07/22/2009)   Foot exam action/deferral: Do today   High risk foot: Not documented   Foot care education: Done  (07/22/2009)    Urine microalbumin/creatinine ratio: Not documented    Diabetes flowsheet reviewed?: Yes   Progress toward A1C goal: Improved  Hypertension   Last Blood Pressure: 124 / 79  (07/22/2009)   Serum creatinine: 1.03  (06/16/2009)   Serum potassium 3.5  (06/16/2009)    Hypertension flowsheet reviewed?: Yes   Progress toward BP goal: At goal  Lipids   Total Cholesterol:  238  (06/12/2008)   LDL: 126  (06/12/2008)   LDL Direct: 67  (12/05/2008)   HDL: 36  (06/12/2008)   Triglycerides: 379  (06/12/2008)    SGOT (AST): 13  (06/16/2009)   SGPT (ALT): 10  (06/16/2009)   Alkaline phosphatase: 109  (06/16/2009)   Total bilirubin: 0.3  (06/16/2009)    Lipid flowsheet reviewed?: Yes   Progress toward LDL goal: Improved  Self-Management Support :    Diabetes self-management support: Not documented    Hypertension  self-management support: Not documented    Lipid self-management support: Not documented    Nursing Instructions: Provide Hemoccult cards with instructions (see order) Diabetic foot exam today Refer for screening diabetic eye exam (see order)    Diabetic Foot Exam Foot Inspection Is there a history of a foot ulcer?              No Is there a foot ulcer now?              No Is there swelling or an abnormal foot shape?          No Are the toenails long?                No Are the toenails thick?                Yes Are the toenails ingrown?              No Is there heavy callous build-up?              No Is there pain in the calf muscle (Intermittent claudication) when walking?    NoIs there a claw toe deformity?              No Is there elevated skin temperature?            No Is there limited ankle dorsiflexion?            No Is there foot or ankle muscle weakness?            No  Diabetic Foot Care Education Patient educated on appropriate care of diabetic feet.  Pulse Check          Right Foot          Left Foot Posterior Tibial:        normal            normal Dorsalis Pedis:        normal            normal    10-g (5.07) Semmes-Weinstein Monofilament Test Performed by: Eustaquio Boyden  MD          Right Foot          Left Foot Visual Inspection               Test Control      normal         normal Site 1         normal         normal Site 2         normal         normal Site 3         normal         normal Site 4         abnormal         normal Site 5         abnormal  normal Site 6         abnormal         normal Site 7         normal         normal Site 8         normal         normal Site 9         normal         normal Site 10         normal         normal  Impression      abnormal         normal
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
## 2011-01-26 NOTE — Assessment & Plan Note (Signed)
Summary: lower back pain,df   Vital Signs:  Patient profile:   65 year old female Height:      61 inches Weight:      154.2 pounds BMI:     29.24 Temp:     97.3 degrees F oral Pulse rate:   78 / minute Pulse rhythm:   regular BP sitting:   204 / 99  (left arm)  Vitals Entered By: Modesta Messing LPN (April 11, 2009 9:15 AM) CC: Pain in legs and lower back. Pain Assessment Patient in pain? yes     Location: lower back and legs Intensity: 8 Type: aching   History of Present Illness: 65 y/o female presents today with cc of severe pain of lower back. She reports that the pain is on the lower right side of her back and radiates down her legs especially the right. She reports that she does have tingling at times, no numbness. No problems with bowel and bladder. She denies fever, nausea, vomiting, constipation, diarrhea, dysuria, urinary frequency, or pain with urination.    She reports that she has had this problem for a couple of years but it has gotten worse the last few weeks. She went to the ER at [facility] 2 weeks ago for the same issue and was given Flexeril and Hydrocodone (she doesn't have anymore).    Blood pressure today is 204/99. She has been out of 2 of her blood pressure medicines (Coreg and Novvasc)  for a while--unsure of timeframe.  She has been taking her Enalapril and HCTZ daily. She has been taking her Plavix daily.  She reports she occasionally has changes in her vision, "they get blurry", she denies headaches, chest pain, loss of sensation/function of extremities. She reports that she smokes 1/2 ppd sometimes more. Denies ETOH.  She has several stressors at home, with husband, and grandchildren living in the home.   Diagnosed with breast cancer in Nov 2009. She has not followed up with that at the [facility]. She reports that she was told she had to come up with $160 before she could be seen. She does not have the money.    Allergies: 1)  ! Aspirin  Review of  Systems      See HPI CV:  Complains of fatigue; denies chest pain or discomfort, lightheadness, palpitations, shortness of breath with exertion, and swelling of feet. MS:  Complains of loss of strength, low back pain, cramps, and muscle weakness.  Physical Exam  General:  alert, well-developed, and well-nourished.   Lungs:  normal respiratory effort, normal breath sounds, no fremitus, no crackles, and no wheezes.   Heart:  normal rate, regular rhythm, no murmur, no gallop, and no rub.   Abdomen:  soft, non-tender, and normal bowel sounds.   Msk:  MAE x4   Detailed Back/Spine Exam  General:    anxious appearing.    Lumbosacral Exam:  Inspection-deformity:    Normal Palpation-spinal tenderness:  Abnormal Range of Motion:    Forward Flexion:   20 degrees    Hyperextension:   25 degrees    Right Lateral Bend:   10 degrees    Left Lateral Bend:   10 degrees Squatting:      Unable to do Lying Straight Leg Raise:    Right:  positive at 20 degrees    Left:  positive in back only Sitting Straight Leg Raise:    Right:  positive at 20 degrees    Left:  positive in back  only Sciatic Notch:    There is right sciatic notch tenderness. Toe Walking:    Right:  abnormal    Left:  abnormal Heel Walking:    Right:  abnormal    Left:  abnormal Patrick's Maneuver:    Right:  positive    Left:  positive   Impression & Recommendations:  Problem # 1:  LOW BACK PAIN SYNDROME, SEVERE (ICD-724.2) Assessment Deteriorated   Encouraged to use ice frequently throughout the day   The following medications were removed from the medication list:    Anacin 81 Mg Tbec (Aspirin) .Marland Kitchen... 1 by mouth daily    Hydrocodone-acetaminophen 5-325 Mg Tabs (Hydrocodone-acetaminophen) .Marland Kitchen... 1 by mouth up to 4 times per day as needed for pain Her updated medication list for this problem includes:    Ultram 50 Mg Tabs (Tramadol hcl) .Marland Kitchen... 2 tabs three times a day as needed pain    Flexeril 10 Mg Tabs  (Cyclobenzaprine hcl) ..... One three times a day prn  Orders: Sed Rate (ESR)-[facility] [id number]) Morphine Sulfate inj 10 mg (J2270) [facility]- Est  Level 4 (47829)  Problem # 2:  HYPERTENSION, BENIGN SYSTEMIC (ICD-401.1) Assessment: Deteriorated   Has not been taking her Coreg and Norvasc because she has been out of them for some time.   Spoke at length regarding the importance of controlling her blood pressure especially since she is on Plavix.    Instructed to go by the drugstore as soon as she leaves today to pick up her meds.   She has several stressors at home, spoke with her and her daughter about the importance of decreasing those stressors   To return to see Dr. Luz Brazen next week. Her updated medication list for this problem includes:    Enalapril Maleate 20 Mg Tabs (Enalapril maleate) .Marland Kitchen... Take 1 tablet by mouth once a day    Hydrochlorothiazide 25 Mg Tabs (Hydrochlorothiazide) .Marland Kitchen... Take 1 tablet by mouth every morning    Coreg 6.25 Mg Tabs (Carvedilol) ..... One tab by mouth bid    Norvasc 10 Mg Tabs (Amlodipine besylate) .Marland Kitchen... 1 by mouth daily  Orders: Comp Met-[facility] [id number]) [facility]- Est  Level 4 (84696)  Problem # 3:  LYMPHOMA (ICD-202.80)   This was found in Nov 2009. She has not been able to follow up with Cancer Center because of lack of money. She was asked to bring in the information she has at home about the Cancer Center on her next visit next week with Dr. Luz Brazen.   Orders: [facility]- Est  Level 4 (99214)  Complete Medication List: 1)  Advair Diskus 500-50 Mcg/dose Misc (Fluticasone-salmeterol) .... Inhale 1 puff as directed twice a day 2)  Albuterol 90 Mcg/act Aers (Albuterol) .... Inhale 2 puff as directed four times a day 3)  Enalapril Maleate 20 Mg Tabs (Enalapril maleate) .... Take 1 tablet by mouth once a day 4)  Flonase 50 Mcg/act Susp (Fluticasone propionate) .... Spray 1 spray into both nostrils once a day 5)  Hydrochlorothiazide 25 Mg Tabs (Hydrochlorothiazide) ....  Take 1 tablet by mouth every morning 6)  Singulair 10 Mg Tabs (Montelukast sodium) .... Take 1 tablet by mouth at bedtime 7)  Coreg 6.25 Mg Tabs (Carvedilol) .... One tab by mouth bid 8)  Ultram 50 Mg Tabs (Tramadol hcl) .... 2 tabs three times a day as needed pain 9)  Flexeril 10 Mg Tabs (Cyclobenzaprine hcl) .... One three times a day prn 10)  Zocor 80 Mg Tabs (Simvastatin) .Marland Kitchen.. 1 by  mouth nightly 11)  Plavix 75 Mg Tabs (Clopidogrel bisulfate) .Marland Kitchen.. 1 by mouth daily 12)  Norvasc 10 Mg Tabs (Amlodipine besylate) .Marland Kitchen.. 1 by mouth daily  Other Orders: CBC w/Diff-[facility] (16109)  Patient Instructions: 1)  YOU MUST START YOU COREG AND AMLODPINE TODAY< YOUR BP IS SO HIGH THAT YOU COULD HAVE A DEVISTATING STROKE AT ANYTIME> 2)  Please schedule a follow-up appointment in 1 weeks with Dr. Luz Brazen only, and you must come in. 3)  May use tramadol and flexeril for pain 4)  May use ice to back 5)  We will need to contact Oncology to follow up on your cancer. Prescriptions: FLEXERIL 10 MG TABS (CYCLOBENZAPRINE HCL) one three times a day prn  #90 x 0   Entered and Authorized by:   Luretha Murphy NP   Signed by:   Luretha Murphy NP on 04/11/2009   Method used:   Electronically to        [facility] [phone]* (retail)       [address] [place]  [address]       Ph: [phone] or [phone]       Fax: [phone]   RxID:   [id number] ULTRAM 50 MG  TABS (TRAMADOL HCL) 2 tabs three times a day as needed pain  #90 x 0   Entered and Authorized by:   Luretha Murphy NP   Signed by:   Luretha Murphy NP on 04/11/2009   Method used:   Electronically to        [facility] [phone]* (retail)       [address] [place]  [address]       Ph: [phone] or [phone]       Fax: [phone]   RxID:   [id number] NORVASC 10 MG TABS (AMLODIPINE BESYLATE) 1 by mouth daily  #30 x 6   Entered and Authorized by:   Luretha Murphy NP   Signed by:   Luretha Murphy NP on 04/11/2009   Method  used:   Electronically to        [facility] [phone]* (retail)       [address], [place]  [address]       Ph: [phone] or [phone]       Fax: [phone]   RxID:   [id number] COREG 6.25 MG  TABS (CARVEDILOL) One tab by mouth BID  #60 x 6   Entered and Authorized by:   Luretha Murphy NP   Signed by:   Luretha Murphy NP on 04/11/2009   Method used:   Electronically to        [facility] [phone]* (retail)       [address], [place]  [address]       Ph: [phone] or [phone]       Fax: [phone]   RxID:   [id number]    Medication Administration  Injection # 1:    Medication: Morphine Sulfate inj 10 mg    Diagnosis: LOW BACK PAIN SYNDROME, SEVERE (ICD-724.2)    Route: IM    Site: LUOQ gluteus    Exp Date: [id number]    Lot #: [id number]    Mfr: Hospira    Patient tolerated injection without complications    Given by: Modesta Messing  LPN (April 11, 2009 10:42 AM)  Orders Added: 1)  Comp Met-[facility] [[id number]] 2)  CBC w/Diff-[facility] [85025] 3)  Sed Rate (ESR)-[facility] [85651] 4)  Morphine Sulfate inj 10 mg [J2270] 5)  [facility]- Est  Level 4 [57846]  Appended Document: sedrate  =  6 mm/hr    Lab Visit   Laboratory Results   Blood Tests   Date/Time Received: April 11, 2009 10:42 AM  Date/Time Reported: April 11, 2009 2:32 PM   SED rate: 6 mm/hr  Comments: ...............test performed by......Marland KitchenBonnie A. [place], [place] (ASCP)     Orders Today:
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
## 2011-01-26 NOTE — Miscellaneous (Signed)
  Clinical Lists Changes  Problems: Added new problem of PERIPHERAL VASCULAR DISEASE (ICD-443.9) Orders: Added new Referral order of Cardiology Referral (Cardiology) - Signed

## 2011-01-26 NOTE — Assessment & Plan Note (Signed)
Summary: F/U DEHYDRATION PER [facility]   Vital Signs:  Patient profile:   65 year old female Weight:      152.3 pounds Pulse rate:   66 / minute BP sitting:   140 / 79  (left arm)  Vitals Entered By: Renato Battles slade,cma CC: f/up labs. dehydration. Is Patient Diabetic? Yes  Pain Assessment Patient in pain? no        Primary Care Provider:  Levander Campion MD  CC:  f/up labs. dehydration.Marland Kitchen  History of Present Illness: Follow up from 4 days ago,seen for low BP,  lethargy and suspect clinical dehydraion ( urine was very concentrated).  In the face of the heat and hyperglycemia her HCTZ was disconinued and she was instructed to drink a lot of hydrating fluids.    [facility]  Has apt with new primary MD in a few weeks.  Is attending education classes at [facility] as she is newly diagnosed diabetic.  Habits & Providers  Alcohol-Tobacco-Diet     Tobacco Status: current     Tobacco Counseling: to quit use of tobacco products  Current Medications (verified): 1)  Advair Diskus 500-50 Mcg/dose Misc (Fluticasone-Salmeterol) .... Inhale 1 Puff As Directed Twice A Day 2)  Albuterol 90 Mcg/act Aers (Albuterol) .... Inhale 2 Puff As Directed Four Times A Day 3)  Enalapril Maleate 20 Mg Tabs (Enalapril Maleate) .... Take 1 Tablet By Mouth Once A Day 4)  Flonase 50 Mcg/act Susp (Fluticasone Propionate) .... Spray 1 Spray Into Both Nostrils Once A Day 5)  Singulair 10 Mg Tabs (Montelukast Sodium) .... Take 1 Tablet By Mouth At Bedtime 6)  Coreg 6.25 Mg  Tabs (Carvedilol) .... One Tab By Mouth Bid 7)  Ultram 50 Mg  Tabs (Tramadol Hcl) .... 2 Tabs Three Times A Day As Needed Pain 8)  Flexeril 10 Mg Tabs (Cyclobenzaprine Hcl) .... One Three Times A Day Prn 9)  Zocor 80 Mg  Tabs (Simvastatin) .Marland Kitchen.. 1 By Mouth Nightly 10)  Plavix 75 Mg  Tabs (Clopidogrel Bisulfate) .Marland Kitchen.. 1 By Mouth Daily 11)   Norvasc 10 Mg Tabs (Amlodipine Besylate) .Marland Kitchen.. 1 By Mouth Daily 12)  [facility] Nicotine Polacrilex 2 Mg Gum (Nicotine Polacrilex) .Marland Kitchen.. 1 Piece Every 4 Hours As Needed For Cigarette Cravings Disp: Qs X One Month 13)  Metformin Hcl 500 Mg Tabs (Metformin Hcl) .Marland Kitchen.. 1 By Mouth Daily X 5 Days, Then 1 By Mouth Two Times A Day 14)  Blood Glucose Meter  Kit (Blood Glucose Monitoring Suppl) .... Meter and Supplies Check Sugar As Directed  Allergies (verified): 1)  ! Aspirin  Physical Exam  General:  Alert, sitting upright and active appearing Mouth:  moist Lungs:  normal respiratory effort and normal breath sounds.   Heart:  normal rate and regular rhythm.     Impression & Recommendations:  Problem # 1:  WEAKNESS (ICD-780.79)  Suspect symptoms were related to clinical dehydration in the face of  high climate temperatures, not taking in enough hydrating fluids, mild hyperglycemia, and HCTZ.  Stopped HCTZ and asked patient to drink a lot of water.  She reports gradual improvement.  BP at 140/49 today.  Orders: [facility]- Est Level  2 (54098)  Complete Medication List: 1)  Advair Diskus 500-50 Mcg/dose Misc (Fluticasone-salmeterol) .... Inhale 1 puff as directed twice a day 2)  Albuterol 90 Mcg/act Aers (Albuterol) .... Inhale 2 puff as directed four times a  day 3)  Enalapril Maleate 20 Mg Tabs (Enalapril maleate) .... Take 1 tablet by mouth once a day 4)  Flonase 50 Mcg/act Susp (Fluticasone propionate) .... Spray 1 spray into both nostrils once a day 5)  Singulair 10 Mg Tabs (Montelukast sodium) .... Take 1 tablet by mouth at bedtime 6)  Coreg 6.25 Mg Tabs (Carvedilol) .... One tab by mouth bid 7)  Ultram 50 Mg Tabs (Tramadol hcl) .... 2 tabs three times a day as needed pain 8)  Flexeril 10 Mg Tabs (Cyclobenzaprine hcl) .... One three times a day prn 9)  Zocor 80 Mg Tabs (Simvastatin) .Marland Kitchen.. 1 by mouth nightly 10)  Plavix 75 Mg Tabs (Clopidogrel bisulfate) .Marland Kitchen.. 1 by mouth daily 11)  Norvasc 10 Mg Tabs  (Amlodipine besylate) .Marland Kitchen.. 1 by mouth daily 12)  [facility]vs Nicotine Polacrilex 2 Mg Gum (Nicotine polacrilex) .Marland Kitchen.. 1 piece every 4 hours as needed for cigarette cravings disp: qs x one month 13)  Metformin Hcl 500 Mg Tabs (Metformin hcl) .Marland Kitchen.. 1 by mouth daily x 5 days, then 1 by mouth two times a day 14)  Blood Glucose Meter Kit (Blood glucose monitoring suppl) .... Meter and supplies check sugar as directed  Patient Instructions: 1)  Apt with new primary MD in the next month.  Appended Document: F/U DEHYDRATION PER [facility]    Clinical Lists Changes  Observations: Added new observation of PAST MED HX:  brain tumor - meningioma removed 1998-[facility], H/o dysphagia--resolved with esoph dilation  R CVA s/p meningioma removal Current Problems:  DIABETES MELLITUS, TYPE II (ICD-250.00) --> [facility] teaching LEUKEMIA, LYMPHOCYTIC, CHRONIC (ICD-204.10) LOW BACK PAIN SYNDROME, SEVERE (ICD-724.2) ADJUSTMENT DISORDER WITH DEPRESSED MOOD (ICD-309.0) ROUTINE GYNECOLOGICAL EXAMINATION (ICD-V72.31) SPECIAL SCREENING FOR MALIGNANT NEOPLASMS COLON (ICD-V76.51) SCREENING FOR MALIGNANT NEOPLASM OF THE CERVIX (ICD-V76.2) PERIPHERAL VASCULAR DISEASE (ICD-443.9) TOBACCO DEPENDENCE (ICD-305.1) HYPERTENSION, BENIGN SYSTEMIC (ICD-401.1) HYPERCHOLESTEROLEMIA (ICD-272.0) GASTROESOPHAGEAL REFLUX, NO ESOPHAGITIS (ICD-530.81) COPD (ICD-496) ANXIETY (ICD-300.00)   (07/02/2009 17:24) Added new observation of PRIMARY MD: Levander Campion MD (07/02/2009 17:24)        Past History:  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 LEUKEMIA, LYMPHOCYTIC, CHRONIC (ICD-204.10) LOW BACK PAIN SYNDROME, SEVERE (ICD-724.2) ADJUSTMENT DISORDER  WITH DEPRESSED MOOD (ICD-309.0) ROUTINE GYNECOLOGICAL EXAMINATION (ICD-V72.31) SPECIAL SCREENING FOR MALIGNANT NEOPLASMS COLON (ICD-V76.51) SCREENING FOR MALIGNANT NEOPLASM OF THE CERVIX (ICD-V76.2) PERIPHERAL VASCULAR DISEASE (ICD-443.9) TOBACCO DEPENDENCE (ICD-305.1) HYPERTENSION, BENIGN SYSTEMIC (ICD-401.1) HYPERCHOLESTEROLEMIA (ICD-272.0) GASTROESOPHAGEAL REFLUX, NO ESOPHAGITIS (ICD-530.81) COPD (ICD-496) ANXIETY (ICD-300.00)

## 2011-01-26 NOTE — Assessment & Plan Note (Signed)
Summary: F/U/[name] [name]   Vital Signs:  Patient profile:   65 year old female Height:      62 inches Weight:      149 pounds BMI:     27.35 Pulse rate:   74 / minute BP sitting:   120 / 80  (right arm)  Vitals Entered By: Arlyss Repress CMA, (September 24, 2010 1:44 PM) CC: f/up last OV. Is Patient Diabetic? Yes Pain Assessment Patient in pain? yes     Location: head Intensity: 8 Onset of pain  several weeks.   Primary Care Provider:  Clementeen Graham MD  CC:  f/up last OV.Marland Kitchen  History of Present Illness: Breathing:  Stable to improved.  Was seen by cards and had ECHO which showed mild LVH and Diastolic dyusfunction.  Using albuterol as needed and Advair 500/50 twice daily as directed.  Continues to smoke 4 cigs daily. No fever, chills or increased spitum recently. Has been years since last PFTs. Agreeable to flu shot today.  Headache:  Two months at at posterior right skull base. No vision change, or dizzyness. Mild releived by tylenol.  Off and on.     Habits & Providers  Alcohol-Tobacco-Diet     Tobacco Status: current     Tobacco Counseling: to quit use of tobacco products     Cigarette Packs/Day: <0.25  Current Problems (verified): 1)  Episodic Tension Type Headache  (ICD-339.11) 2)  Gait Imbalance  (ICD-781.2) 3)  Hypertension, Benign Systemic  (ICD-401.1) 4)  Hypercholesterolemia  (ICD-272.0) 5)  Diabetes Mellitus, Type II  (ICD-250.00) 6)  COPD  (ICD-496) 7)  Leukemia, Lymphocytic, Chronic  (ICD-204.10) 8)  Low Back Pain Syndrome, Severe  (ICD-724.2) 9)  Adjustment Disorder With Depressed Mood  (ICD-309.0) 10)  Routine Gynecological Examination  (ICD-V72.31) 11)  Special Screening For Malignant Neoplasms Colon  (ICD-V76.51) 12)  Screening For Malignant Neoplasm of The Cervix  (ICD-V76.2) 13)  Peripheral Vascular Disease  (ICD-443.9) 14)  Tobacco Dependence  (ICD-305.1) 15)  Gastroesophageal Reflux, No Esophagitis  (ICD-530.81) 16)  Anxiety  (ICD-300.00)  Current  Medications (verified): 1)  Plavix 75 Mg  Tabs (Clopidogrel Bisulfate) .Marland Kitchen.. 1 By Mouth Daily 2)  Norvasc 10 Mg Tabs (Amlodipine Besylate) .Marland Kitchen.. 1 By Mouth Daily 3)  Zocor 20 Mg Tabs (Simvastatin) .Marland Kitchen.. 1 By Mouth Qhs 4)  Coreg 6.25 Mg  Tabs (Carvedilol) .... One Tab By Mouth Bid 5)  Enalapril Maleate 20 Mg Tabs (Enalapril Maleate) .... Take 1 Tablet By Mouth Once A Day 6)  Metformin Hcl 500 Mg Tabs (Metformin Hcl) .Marland Kitchen.. 1 By Mouth Two Times A Day 7)  Advair Diskus 500-50 Mcg/dose Misc (Fluticasone-Salmeterol) .... Inhale 1 Puff As Directed Twice A Day 8)  Albuterol 90 Mcg/act Aers (Albuterol) .... Inhale 2 Puff As Directed Four Times A Day 9)  Singulair 10 Mg Tabs (Montelukast Sodium) .... Take 1 Tablet By Mouth At Bedtime 10)  Flonase 50 Mcg/act Susp (Fluticasone Propionate) .... Spray 1 Spray Into Both Nostrils Once A Day 11)  Ultram 50 Mg  Tabs (Tramadol Hcl) .... 2 Tabs Twice Daily For Pain As Needed 12)  Flexeril 10 Mg Tabs (Cyclobenzaprine Hcl) .... One By Mouth Two Times A Day As Needed Muscle Spasm 13)  Cvs Nicotine Polacrilex 2 Mg Gum (Nicotine Polacrilex) .Marland Kitchen.. 1 Piece Every 4 Hours As Needed For Cigarette Cravings Disp: Qs X One Month 14)  Onetouch Ultrasoft Lancets  Misc (Lancets) .... Use As Directed 15)  Onetouch Ultra Test  Strp (Glucose Blood) .... Use  As Directed, Qs 1 Month 16)  Cilostazol 50 Mg Tabs (Cilostazol) .... Dose Per Vasc Surg  Allergies (verified): 1)  ! Aspirin  Past History:  Past Medical History: Last updated: 06/18/2010  brain tumor - meningioma removed 1998-[facility], H/o dysphagia--resolved with esoph dilation  R CVA s/p meningioma removal Current Problems:  DIABETES MELLITUS, TYPE II (ICD-250.00) --> [facility] LEUKEMIA, LYMPHOCYTIC, CHRONIC (ICD-204.10) found on mammo with abnormal LN after biopsy May 10, 2008 (stage 0, indolent) LOW BACK PAIN SYNDROME, SEVERE (ICD-724.2) ADJUSTMENT DISORDER WITH DEPRESSED  MOOD (ICD-309.0) ROUTINE GYNECOLOGICAL EXAMINATION (ICD-V72.31) SPECIAL SCREENING FOR MALIGNANT NEOPLASMS COLON (ICD-V76.51) SCREENING FOR MALIGNANT NEOPLASM OF THE CERVIX (ICD-V76.2) PERIPHERAL VASCULAR DISEASE (ICD-443.9) - ABI 0.68 05-10-10, h/o bypass RLE TOBACCO DEPENDENCE (ICD-305.1) HYPERTENSION, BENIGN SYSTEMIC (ICD-401.1) HYPERCHOLESTEROLEMIA (ICD-272.0) GASTROESOPHAGEAL REFLUX, NO ESOPHAGITIS (ICD-530.81) COPD (ICD-496) ANXIETY (ICD-300.00)  Past Surgical History: Last updated: 07/22/2009 Cardiac Cath: normal coronary arteries 02/24/2002 Cholecystectomy - 03/27/2002 EGD: esophageal dilation (Dr. Madilyn Fireman); esophagus was diffusely dilated distally w/ abrupt taper into LES-?achalasia - 01/28/2004 Spirometry: mod obstr dz w/ mod improvement post-bronchodilator - 03/27/2004 Bypass grafting of RLE for PAD claudication - SFA patent with popliteal stent in place (12/28/2006) Cardiac Cath: normal CA 04/05/2007  Social History: Last updated: 08/20/2010 1 pack every week.  h/o quitting in past.  Lives with husband, 3 grandchildren in house; Denies alcohol, illicit drugs Brother passed away 2007/02/10, sister had a stroke.  Stress with grandchildren. Married  Risk Factors: Smoking Status: current (09/24/2010) Packs/Day: <0.25 (09/24/2010)  Family History: Reviewed history from 02/23/2007 and no changes required. `diabetes in several family members`, Brother--IDDM, Father--unknown (was not involved with her), Mother--deceased 05-10-04; CAD, Unsure about other family history  Social History: Reviewed history from 08/20/2010 and no changes required. 1 pack every week.  h/o quitting in past.  Lives with husband, 3 grandchildren in house; Denies alcohol, illicit drugs Brother passed away 02/10/07, sister had a stroke.  Stress with grandchildren. Married  Review of Systems       The patient complains of headaches.  The patient denies anorexia, fever, weight loss, hoarseness, chest pain, syncope, dyspnea on  exertion, peripheral edema, prolonged cough, and abdominal pain.    Physical Exam  General:  VS noted.  Well woman in NAD Head:  Normocephalic and atraumatic without obvious abnormalities. No apparent alopecia or balding. Eyes:  PERRL EOMI, sclera and conjentiva clear Mouth:  moist, teeth missing, no lesions noted in lips, gums or palpate Neck:  Mild TTP at posterior right skull base in muscle insertion site.  Lungs:  Normal respiratory effort, chest expands symmetrically. Lungs are clear to auscultation, no crackles or wheezes. Heart:  normal S1, S2, no m/r/g Abdomen:  Bowel sounds positive,abdomen soft and non-tender without masses, organomegaly or hernias noted. Extremities:  Lower extremities Physical Exam includes: Cool to palpation, delayed capillary refill, diminished pulses, absence of limb hair,  current minor ulceration,  thickened brittle nails.  Both Cervical Nodes:  No lymphadenopathy noted Axillary Nodes:  No palpable lymphadenopathy   Impression & Recommendations:  Problem # 1:  COPD (ICD-496) Assessment Unchanged Think source of night time dyspnea may not be due to CHF.  On high dose ICS and LABA.  No Anticholenergics.  Long time since last PFTs.  Plan to send to Pharm clinic for PFTs and follow up after. Goal 1-2 months.  Also flu shot now for COPD.   Her updated medication list for this problem includes:    Advair Diskus 500-50 Mcg/dose  Misc (Fluticasone-salmeterol) ..... Inhale 1 puff as directed twice a day    Albuterol 90 Mcg/act Aers (Albuterol) ..... Inhale 2 puff as directed four times a day    Singulair 10 Mg Tabs (Montelukast sodium) .Marland Kitchen... Take 1 tablet by mouth at bedtime  Orders: Flu Vaccine 97yrs + ([address])  Problem # 2:  EPISODIC TENSION TYPE HEADACHE (ICD-339.11) Assessment: New  Think headache is related to muscle insertion site irritation.  Advised tyelnol, and ice and hot packs.  Will follow up as above. If no improvement will consider trigger  point injections.   Problem # 3:  HYPERCHOLESTEROLEMIA (ICD-272.0) Assessment: Unchanged F/U LDL and Renal function with LDL and BMP. Her updated medication list for this problem includes:    Zocor 20 Mg Tabs (Simvastatin) .Marland Kitchen... 1 by mouth qhs  Labs Reviewed: SGOT: 19 (03/11/2010)   SGPT: 21 (03/11/2010)  Lipid Goals: Chol Goal: 200 (08/25/2009)   HDL Goal: 40 (08/25/2009)   LDL Goal: 70 (08/25/2009)   TG Goal: 150 (08/25/2009)  Prior 10 Yr Risk Heart Disease: > 32 % (08/25/2009)   HDL:34 (07/22/2009), 36 (06/12/2008)  LDL:136 (07/22/2009), 126 (06/12/2008)  Chol:208 (07/22/2009), 238 (06/12/2008)  Trig:191 (07/22/2009), 379 (06/12/2008)  Complete Medication List: 1)  Plavix 75 Mg Tabs (Clopidogrel bisulfate) .Marland Kitchen.. 1 by mouth daily 2)  Norvasc 10 Mg Tabs (Amlodipine besylate) .Marland Kitchen.. 1 by mouth daily 3)  Zocor 20 Mg Tabs (Simvastatin) .Marland Kitchen.. 1 by mouth qhs 4)  Coreg 6.25 Mg Tabs (Carvedilol) .... One tab by mouth bid 5)  Enalapril Maleate 20 Mg Tabs (Enalapril maleate) .... Take 1 tablet by mouth once a day 6)  Metformin Hcl 500 Mg Tabs (Metformin hcl) .Marland Kitchen.. 1 by mouth two times a day 7)  Advair Diskus 500-50 Mcg/dose Misc (Fluticasone-salmeterol) .... Inhale 1 puff as directed twice a day 8)  Albuterol 90 Mcg/act Aers (Albuterol) .... Inhale 2 puff as directed four times a day 9)  Singulair 10 Mg Tabs (Montelukast sodium) .... Take 1 tablet by mouth at bedtime 10)  Flonase 50 Mcg/act Susp (Fluticasone propionate) .... Spray 1 spray into both nostrils once a day 11)  Ultram 50 Mg Tabs (Tramadol hcl) .... 2 tabs twice daily for pain as needed 12)  Flexeril 10 Mg Tabs (Cyclobenzaprine hcl) .... One by mouth two times a day as needed muscle spasm 13)  Cvs Nicotine Polacrilex 2 Mg Gum (Nicotine polacrilex) .Marland Kitchen.. 1 piece every 4 hours as needed for cigarette cravings disp: qs x one month 14)  Onetouch Ultrasoft Lancets Misc (Lancets) .... Use as directed 15)  Onetouch Ultra Test Strp (Glucose  blood) .... Use as directed, qs 1 month 16)  Cilostazol 50 Mg Tabs (Cilostazol) .... Dose per vasc surg  Other Orders: Direct LDL-[facility] [phone]) Basic Met-[facility] [phone]) Influenza Vaccine NON MCR [phone]) Admin 1st Vaccine (57[address]) Admin 1st Vaccine (State) (90471S) [facility]- Est Level  3 ([address])  Patient Instructions: 1)  Please schedule a follow-up appointment with Dr. Denyse Amass in 1-2 months.  2)  Please make an appt with Dr. Raymondo Band for a lung test.  Dont take the Advair that morning.  3)  Tobacco is very bad for your health and your loved ones ! You should stop smoking !  4)  Stop smoking tips: Choose a quit date. Cut down before the quit date. Decide what you will do as a substitute when you feel the urge to smoke(gum, toothpick, exercise).  5)  You can put ice or heat pack on your neck for pain.  You can also take Tylenol or low doses of Ibuprofen 400mg  three times a day.  Prescriptions: FLONASE 50 MCG/ACT SUSP (FLUTICASONE PROPIONATE) Spray 1 spray into both nostrils once a day  #16 Gram x 11   Entered and Authorized by:   Clementeen Graham MD   Signed by:   Clementeen Graham MD on 09/24/2010   Method used:   Electronically to        [facility]* (mail-order)       [address]       [place], [place]  [address]       Ph: [phone]       Fax: [phone]   RxID:   [id number] SINGULAIR 10 MG TABS (MONTELUKAST SODIUM) Take 1 tablet by mouth at bedtime  #90 x 3   Entered and Authorized by:   Clementeen Graham MD   Signed by:   Clementeen Graham MD on 09/24/2010   Method used:   Electronically to        [facility]* (mail-order)       [address]       [place], [place]  [address]       Ph: [phone]       Fax: [phone]   RxID:   [id number] ALBUTEROL 90 MCG/ACT AERS (ALBUTEROL) Inhale 2 puff as directed four times a day  #1 x 3   Entered and Authorized by:   Clementeen Graham MD   Signed by:   Clementeen Graham MD on 09/24/2010   Method used:   Electronically to         [facility]* (mail-order)       [address]       [place], [place]  [address]       Ph: [phone]       Fax: [phone]   RxID:   [id number] ADVAIR DISKUS 500-50 MCG/DOSE MISC (FLUTICASONE-SALMETEROL) Inhale 1 puff as directed twice a day  #1 x 11   Entered and Authorized by:   Clementeen Graham MD   Signed by:   Clementeen Graham MD on 09/24/2010   Method used:   Electronically to        [facility]* (mail-order)       [address]       [place], [place]  [address]       Ph: [phone]       Fax: [phone]   RxID:   [id number] METFORMIN HCL 500 MG TABS (METFORMIN HCL) 1 by mouth two times a day  #180 x 3   Entered and Authorized by:   Clementeen Graham MD   Signed by:   Clementeen Graham MD on 09/24/2010   Method used:   Electronically to        [facility]* (mail-order)       [address]       [place], [place]  [address]       Ph: [phone]       Fax: [phone]   RxID:   [id number] ENALAPRIL MALEATE 20 MG TABS (ENALAPRIL MALEATE) Take 1 tablet by mouth once a day  #90 x 3   Entered and Authorized by:   Clementeen Graham MD   Signed by:   Clementeen Graham MD on 09/24/2010   Method used:   Electronically to        [facility]* (mail-order)       [address]  [place], [place]  [address]       Ph: [phone]       Fax: [phone]   RxID:   [id number] COREG 6.25 MG  TABS (CARVEDILOL) One tab by mouth BID  #180 x 3   Entered and Authorized by:   Clementeen Graham MD   Signed by:   Clementeen Graham MD on 09/24/2010   Method used:   Electronically to        [facility]* (mail-order)       [address]       [place], [place]  [address]       Ph: [phone]       Fax: [phone]   RxID:   [id number] ZOCOR 20 MG TABS (SIMVASTATIN) 1 by mouth qhs  #30 x 12   Entered and Authorized by:   Clementeen Graham MD   Signed by:   Clementeen Graham MD on 09/24/2010   Method used:   Electronically to         [facility]* (mail-order)       [address]       [place], [place]  [address]       Ph: [phone]       Fax: [phone]   RxID:   [id number] NORVASC 10 MG TABS (AMLODIPINE BESYLATE) 1 by mouth daily  #90 x 3   Entered and Authorized by:   Clementeen Graham MD   Signed by:   Clementeen Graham MD on 09/24/2010   Method used:   Electronically to        [facility]* (mail-order)       [address]       [place], [place]  [address]       Ph: [phone]       Fax: [phone]   RxID:   [id number] PLAVIX 75 MG  TABS (CLOPIDOGREL BISULFATE) 1 by mouth daily  #90 x 3   Entered and Authorized by:   Clementeen Graham MD   Signed by:   Clementeen Graham MD on 09/24/2010   Method used:   Electronically to        [facility]* (mail-order)       [address]       [place],   [address]       Ph: [phone]       Fax: [phone]   RxID:   [id number]    Prevention & Chronic Care Immunizations   Influenza vaccine: Fluvax Non-MCR  (09/24/2010)   Influenza vaccine deferral: Deferred  (08/25/2009)   Influenza vaccine due: 08/27/2010    Tetanus booster: 09/26/2002: Done.   Tetanus booster due: 09/26/2012    Pneumococcal vaccine: Done.  (10/27/2001)   Pneumococcal vaccine due: 10/27/2006    H. zoster vaccine: Not documented  Colorectal Screening   Hemoccult: Done.  (02/24/1997)   Hemoccult action/deferral: Ordered  (09/25/2009)   Hemoccult due: 02/24/1998    Colonoscopy: Not documented   Colonoscopy action/deferral: Deferred  (07/22/2009)  Other Screening   Pap smear: normal  (10/01/2008)   Pap smear action/deferral: Deferred-3 yr interval  (04/13/2010)   Pap smear due: 10/02/2011    Mammogram: BI-RADS CATEGORY 4:  Suspicious abnormality - biopsy should be^MM DIGITAL DIAG LTD R  (10/22/2008)   Mammogram action/deferral: Deferred  (08/20/2010)   Mammogram due: 10/22/2009    DXA bone density scan: Not  documented   Smoking status: current  (09/24/2010)   Smoking cessation counseling: YES  (  09/24/2010)   Target quit date: 05/27/2010  (04/21/2010)  Diabetes Mellitus   HgbA1C: 6.6  (08/20/2010)   Hemoglobin A1C due: 06/09/2007    Eye exam: Not documented   Diabetic eye exam action/deferral: Ophthalmology referral  (07/22/2009)    Foot exam: yes  (07/22/2009)   Foot exam action/deferral: Deferred   High risk foot: Not documented   Foot care education: Done  (07/22/2009)   Foot exam due: 11/25/2009    Urine microalbumin/creatinine ratio: Not documented    Diabetes flowsheet reviewed?: Yes   Progress toward A1C goal: At goal  Lipids   Total Cholesterol: 208  (07/22/2009)   LDL: 136  (07/22/2009)   LDL Direct: 54  (03/11/2010)   HDL: 34  (07/22/2009)   Triglycerides: 191  (07/22/2009)    SGOT (AST): 19  (03/11/2010)   SGPT (ALT): 21  (03/11/2010)   Alkaline phosphatase: 116  (03/11/2010)   Total bilirubin: 0.3  (03/11/2010)    Lipid flowsheet reviewed?: Yes   Progress toward LDL goal: At goal  Hypertension   Last Blood Pressure: 120 / 80  (09/24/2010)   Serum creatinine: 1.03  (03/11/2010)   Serum potassium 4.2  (03/11/2010)   Basic metabolic panel due: 09/16/2009    Hypertension flowsheet reviewed?: Yes   Progress toward BP goal: At goal  Self-Management Support :   Personal Goals (by the next clinic visit) :     Personal A1C goal: 8  (12/10/2009)     Personal blood pressure goal: 140/90  (08/25/2009)     Personal LDL goal: 100  (08/25/2009)    Diabetes self-management support: Written self-care plan  (05/15/2010)    Diabetes self-management support not done because: Good outcomes  (04/13/2010)    Hypertension self-management support: Written self-care plan  (05/15/2010)    Hypertension self-management support not done because: Good outcomes  (04/13/2010)    Lipid self-management support: Written self-care plan  (05/15/2010)     Lipid self-management  support not done because: Good outcomes  (04/21/2010)   Influenza Vaccine    Vaccine Type: Fluvax Non-MCR    Site: left deltoid    Mfr: GlaxoSmithKline    Dose: 0.5 ml    Route: IM    Given by: Arlyss Repress CMA,    Exp. Date: 06/23/2011    Lot #: [id number]    VIS given: 07/21/10 version given September 24, 2010.  Flu Vaccine Consent Questions    Do you have a history of severe allergic reactions to this vaccine? no    Any prior history of allergic reactions to egg and/or gelatin? no    Do you have a sensitivity to the preservative Thimersol? no    Do you have a past history of Guillan-Barre Syndrome? no    Do you currently have an acute febrile illness? no    Have you ever had a severe reaction to latex? no    Vaccine information given and explained to patient? yes    Are you currently pregnant? no

## 2011-01-26 NOTE — Assessment & Plan Note (Signed)
Summary: FU/KH   Vital Signs:  Patient Profile:   65 Years Old Female Height:     62 inches Weight:      152.9 pounds O2 Sat:      94 % Temp:     97.1 degrees F oral Pulse rate:   67 / minute BP sitting:   97 / 56  (right arm)  Pt. in pain?   yes  Vitals Entered By: Arlyss Repress CMA, (December 12, 2007 2:31 PM) Oxygen therapy Room Air                  PCP:  Levander Campion MD  Chief Complaint:  chest congestion with pain; cough x 1 1/2 week; f/up last OV. O2Sat after Neb 97%.  History of Present Illness: Ms Mcmiller is here to follow up for the following: 1. claudication:  has seen the vascular surgeon, and been diagnosed with extensive clots in right leg.  Scheduled to have stents placed by vascular surgeon jan 2.  Started on plavix by Dr. Algie Coffer. 2. cough/congestion: for 2 weeks Ms. Bowditch has had chills, sweats, productive cough, sore throat, and increase trouble breathing.  Has tried nyquil without help.    Current Allergies: ! ASPIRIN      Physical Exam  General:     moderate distress.   Nose:     External nasal examination shows no deformity or inflammation. Nasal mucosa are pink and moist without lesions or exudates. Mouth:     Oral mucosa and oropharynx without lesions or exudates.  Teeth in good repair. Neck:     No deformities, masses, or tenderness noted. Lungs:     normal respiratory effort, no accessory muscle use, R wheezes, and L wheezes.   Heart:     Normal rate and regular rhythm. S1 and S2 normal without gallop, murmur, click, rub or other extra sounds. Pulses:     R dorsalis pedis absent and L dorsalis pedis decreased.   Extremities:     No clubbing, cyanosis, edema, or deformity noted with normal full range of motion of all joints.      Impression & Recommendations:  Problem # 1:  PERIPHERAL VASCULAR DISEASE (ICD-443.9) Assessment: Comment Only scheduled for stents jan 2.  continue plavix.  Seeing Dr. Algie Coffer re: potential stress  test.  Problem # 2:  CHRONIC OBSTRUCTIVE PULMONARY DISEASE, ACUTE EXACERBATION (ICD-491.21) Assessment: New doxy and prednisone.  neb treatment given in office with improvement in wheezing.  O2 sat wnl.    Problem # 3:  TOBACCO DEPENDENCE (ICD-305.1) Assessment: Unchanged interested in quitting, especially given diagnosis of PAD.  Refer to smoking cessation clinic.  Problem # 4:  HYPERTENSION, BENIGN SYSTEMIC (ICD-401.1) Assessment: Improved BP low today.  hold HCTZ, as coreg and enalapril important meds for PAD (CVD equilavent). Her updated medication list for this problem includes:    Enalapril Maleate 20 Mg Tabs (Enalapril maleate) .Marland Kitchen... Take 1 tablet by mouth once a day    Hydrochlorothiazide 25 Mg Tabs (Hydrochlorothiazide) .Marland Kitchen... Take 1 tablet by mouth every morning    Coreg 6.25 Mg Tabs (Carvedilol) ..... One tab by mouth bid   Complete Medication List: 1)  Advair Diskus 500-50 Mcg/dose Misc (Fluticasone-salmeterol) .... Inhale 1 puff as directed twice a day 2)  Albuterol 90 Mcg/act Aers (Albuterol) .... Inhale 2 puff as directed four times a day 3)  Enalapril Maleate 20 Mg Tabs (Enalapril maleate) .... Take 1 tablet by mouth once a day 4)  Flonase 50 Mcg/act  Susp (Fluticasone propionate) .... Spray 1 spray into both nostrils once a day 5)  Hydrochlorothiazide 25 Mg Tabs (Hydrochlorothiazide) .... Take 1 tablet by mouth every morning 6)  Singulair 10 Mg Tabs (Montelukast sodium) .... Take 1 tablet by mouth at bedtime 7)  Coreg 6.25 Mg Tabs (Carvedilol) .... One tab by mouth bid 8)  Ultram 50 Mg Tabs (Tramadol hcl) .... One to two tabs by mouth q 6 hrs as needed pain 9)  Flexeril 10 Mg Tabs (Cyclobenzaprine hcl) .... 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 1 by mouth nightly  Other Orders: Nebulizer- [facility] [phone]) Albuterol Sulfate Sol 1mg  unit dose (J8119) Solumedrol up to  40mg  (J2920) [facility]- Est Level  3 (14782)   Patient Instructions: 1)  Hold your hydrochlorothiazide for now. 2)  Take prednisone for 9 more days, starting tomorrow. 3)  Take doxycyline twice daily for 10 days, first dose tonight. 4)  Do albuterol treatments every fours hours for the next 48 hours. 5)  Return to clinic if you develop high fevers or worsening breathing!  If we are not open, go to the ER. 6)  Follow up with me in February re: blood pressure meds and to check lipids. 7)  Stop taking pravachol 40 mg, and begin zocor 80 mg daily for vascular disease and cholesterol.    Prescriptions: ULTRAM 50 MG  TABS (TRAMADOL HCL) One to two tabs by mouth q 6 hrs as needed pain  #45 x 2   Entered and Authorized by:   Levander Campion MD   Signed by:   Levander Campion MD on 01/01/2008   Method used:   Printed then faxed to ...       [facility] [phone]*       [address] [place], [place]  [address]       Ph: [phone] or [phone]       Fax: [phone]   RxID:   [id number] ZOCOR 80 MG  TABS (SIMVASTATIN) 1 by mouth nightly  #30 x 3   Entered and Authorized by:   Levander Campion MD   Signed by:   Levander Campion MD on 12/14/2007   Method used:   Electronically sent to ...       [facility] [phone]*       [address], [place]  [address]       Ph: [phone] or [phone]       Fax: [phone]   RxID:   [id number]  ]  Medication Administration  Injection # 1:    Medication: Solumedrol up to 40mg     Diagnosis: ASTHMA, UNSPECIFIED (ICD-493.90)    Route: IM    Site: R deltoid    Exp Date: 04/26/2009    Lot #: [id number]    Mfr: Pharmacia    Patient tolerated injection without complications    Given by: Arlyss Repress CMA, (December 12, 2007 3:46 PM)  Medication # 1:    Medication: Albuterol Sulfate Sol 1mg  unit dose    Diagnosis: ASTHMA, UNSPECIFIED (ICD-493.90)    Dose: 3ml    Route: inhaled    Exp Date: 06/26/2009     Lot #: [id number]    Mfr: nephron    Patient tolerated medication without complications    Given by:  THEKLA SLADE CMA, (December 12, 2007 3:22 PM)  Orders Added: 1)  Nebulizer- [facility] [[address]] 2)  Albuterol Sulfate Sol 1mg  unit dose [J7613] 3)  Solumedrol up to 40mg  [J2920] 4)  [facility]- Est Level  3 [60454]
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
## 2011-01-26 NOTE — Consult Note (Signed)
Summary: [facility]   Imported By: Clydell Hakim 09/03/2010 16:38:09  _____________________________________________________________________  External Attachment:    Type:   Image     Comment:   External Document

## 2011-01-26 NOTE — Progress Notes (Signed)
Summary: [place] request   Phone Note Call from Patient Call back at Home Phone [phone]   Summary of Call: Pt states her BP keeps going up and would like to speak with an rn about it. Initial call taken by: Haydee Salter,  August 29, 2007 1:52 PM  Follow-up for Phone Call        pt reports elevated BP . she had BP checked at her church gathering a month ago and reading was high at over 200 systolic and over 100 diastolic . she is unsure of  exact numbers. she checked again last week at a drug store with reading of 204/100. she thought she was out of bp medication and had not taken for a couple of days. ahe does have medication now and is taking regularly. appointment scheduled tomorrow regarding elevated BP. Follow-up by: Theresia Lo RN,  August 29, 2007 2:42 PM

## 2011-01-26 NOTE — Letter (Signed)
Summary: [name] [name], [name] Hageman, MD Ophthalmologist  [name] [name], [name] Hageman, MD Ophthalmologist   Imported By: Knox Royalty 11/26/2010 11:59:58  _____________________________________________________________________  External Attachment:    Type:   Image     Comment:   External Document

## 2011-01-26 NOTE — Progress Notes (Signed)
Summary: low BP  Phone Note Call from Patient Call back at [phone]   Summary of Call: sugar 115.  BP down to 95/67.  Normally normo to hyper tensive, bue only recently started taking meds as prescribed.  No fevers.  No pain.  + dizzy and weak.  Has not taken extra meds.  Advised to get plenty of fluid and rest tonight.  If still feeling dizzy and weak, should come in to ED to be seen.  o/w wait till morning and call clinic for work in appointment in next few days. Initial call taken by: Eustaquio Boyden  MD,  June 15, 2009 7:13 PM      Appended Document: low BP left message to call back & make appt

## 2011-01-26 NOTE — Assessment & Plan Note (Signed)
Summary: BP CHECK/ converted to OV  Nurse Visit    PCP:  Levander Campion MD  Chief Complaint:  elevated BP- one of pt's BP meds causing spasms.  History of Present Illness: pt here for BP check with nurse and was found to have elevated BP.  pt has not been taking diltiazem and enalapril regularly because she feels liek she gets muscle cramping on days when she does take it.  she denies any lightheadedness, chest pain.    Hypertension History:      Positive major cardiovascular risk factors include female age 60 years old or older, hyperlipidemia, hypertension, and current tobacco user.  Negative major cardiovascular risk factors include negative family history for ischemic heart disease.        Positive history for target organ damage include prior stroke (or TIA).     Past Medical History:    Reviewed history from 02/23/2007 and no changes required:       ?COPD, ?h/o fibromyalgia (dx`d in 05/14/96), brain tumor - meningioma removed 1998-[facility], H/o dysphagia--resolved with esoph dilation, hospitalized x 3 for asthma exacerbations, Hyperglycemia 2nd to steroid use--A1c 6.5 (12/2003), R CVA  Past Surgical History:    Reviewed history from 02/23/2007 and no changes required:       A1c = 6.0 - 03/03/2005, Adenosine cardiolyte: neg for  ischemia - 12/27/2001, Cardiac Cath: normal coronary arteries - 02/24/2002, Cholecystectomy - 03/27/2002, Cr = 1.0 - 02/10/2005, Cr = 1.1 - 04/20/2005, EGD: esophageal dilation (Dr. Madilyn Fireman); esophagus was diffusely dilated distally w/ abrupt taper into LES-?achalasia - 01/28/2004, Exercise Treadmill Test: +; abnl ST & BP responses; high intermediate post-test risk of CAD - 12/27/2001, Head CT w/o CM = negative - 05/27/2001, Hemoglobin A1C = 6.5 - 09/26/2002, Hemoglobin A1C = 6.6 - 12/27/2002, Peak flows: 240, 250, 250 - 07/28/2003, Peak flows: 270, 180, 190 - 02/19/2004, Spirometry: FEV1/FVC=75%; FVC=50% pred, FEV1=46% pred; severe restriction & ?COPD - 07/28/2003, Spirometry: mod obstr dz w/  mod improvement post-bronchodilator - 03/27/2004   Family History:    Reviewed history from 02/23/2007 and no changes required:       `diabetes in several family members`, Brother--IDDM, Father--unknown (was not involved with her), Mother--deceased 05-14-2004; CAD, Unsure about other family history  Social History:    Reviewed history from 03/09/2007 and no changes required:       1/2 ppd x >20 years, down to 2/day in 2/05; Lives with husband, 3 grandchildren in house; Denies alcohol, illicit drugs       Back to 1 ppd.  Brother passed away 02/14/2007, sister had a stroke.  Stress with grandchildren.       Married   Physical Exam  General:     Well-developed,well-nourished,in no acute distress; alert,appropriate and cooperative throughout examination Lungs:     Normal respiratory effort, chest expands symmetrically. Lungs are clear to auscultation, no crackles or wheezes. Heart:     Normal rate and regular rhythm. S1 and S2 normal without gallop, click, rub or other extra sounds.grade  3/6 systolic murmur.   Extremities:     No clubbing, cyanosis, edema,       Impression & Recommendations:  Problem # 1:  HYPERTENSION, BENIGN SYSTEMIC (ICD-401.1) Assessment: Deteriorated encouraged pt to continue/restart medication. check BMET to assess for electrolyte imbalance, particularly potassium. pt says potassium has been low in past and is not currently taking supplement. review high potassium foods in meantime.  will rx k+ pending results of BMET. f/u with PCP if not improved or  worsens Orders: Basic Met-[facility] [phone]) [facility]- Est Level  3 (29798)   Hypertension Assessment/Plan:      The patient's hypertensive risk group is category C: Target organ damage and/or diabetes.  Today's blood pressure is 183/85.  Her blood pressure goal is < 130/80.   Vital Signs:  Patient Profile:   65 Years Old Female Weight:      152.6 pounds Temp:     98.3 degrees F Pulse rate:   68 / minute BP sitting:   183  / 85  Pt. in pain?   no  Vitals Entered ByJacki Cones RN (May 15, 2007 11:49 AM)                   Orders Added: 1)  Basic Met-[facility] [phone] 2)  [facility]- Est Level  3 [[id number]]

## 2011-01-26 NOTE — Consult Note (Signed)
Summary: [facility] - indolent stage 0 CLL  [facility]   Imported By: Marily Memos 03/11/2010 10:45:49  _____________________________________________________________________  External Attachment:    Type:   Image     Comment:   External Document

## 2011-01-26 NOTE — Letter (Signed)
Summary: Generic Letter  [facility]  [address]   [place], [place] [address]   Phone: [phone]  Fax: [phone]    10/02/2008  [name] [name] [address] [place], [place]  [address]  Dear Ms. Seiden,   I have scheduled two appointments for you.    The first appointment is on October 10, 2008 at 8:45am at [facility].  This appointment is for your kidney ultrasound.  Their office is located at [address] [place], [place] [address], and their telephone number is [phone].  You are supposed to have nothing to eat or drink after 12 midnight the morning of the test.    The other appointment is to talk with the Gastroenterologist about having your colonoscopy.  This appointment is at [facility] with Dr. Loreta Ave on October 15, 2008 at 2:15pm.  Their office is located at [address], [address], [address], [place], [place] [address], and their phone number is [phone].    Please call their offices if you need to reschedule these appointments.          Sincerely,   Yazleemar Strassner MARTIN RN [facility]

## 2011-01-26 NOTE — Miscellaneous (Signed)
Summary: WALK IN  Clinical Lists Changes walked in c/o high bp readings at home this am & a HA centered over L eye. bp r arm sitting 168/82, done manually. she has not taken anything for the HA.  states she took her bp meds at 9am & at 10:50 it was 181/106. no appts at this time. offered 1:30. she declined & is going to UC to be seen now. states her dtr has something to do at 2pm & so 1:30 would not work.Marland KitchenMarland KitchenGolden Circle RN  October 13, 2009 11:58 AM  thanks. noted. Eustaquio Boyden  MD  October 13, 2009 12:31 PM

## 2011-01-26 NOTE — Assessment & Plan Note (Signed)
Summary: f/u  kh   Vital Signs:  Patient profile:   65 year old female Weight:      147.2 pounds Temp:     98..5 degrees F Pulse rate:   62 / minute BP sitting:   156 / 90  Vitals Entered By: Starleen Blue RN (October 21, 2010 1:46 PM) CC: f/u Is Patient Diabetic? Yes Pain Assessment Patient in pain? no        Primary Care Provider:  Clementeen Graham MD  CC:  f/u.  History of Present Illness: Here to follow up COPD, HTN, Smoking, and DM.  COPD: Seen by Dr. Raymondo Band in pharm clinic for PFTs. They showed moderate COPD.  Continued on Advair and QVAR. Although QVAR is likely not doing much.  Pt likes taking QVAR.   Breathing is currently well controlled.   Hypertension Medications: Norvasc, Coreg, Enalapirl. Was on HCTZ years ago but was D/C when dehydrated. Not restarted.  Headache: Mild Posterior headache for a few months off and on. No vision change. Pre/Syncope: No Chest Pain, Palpitation, Dyspnea:No Home BP: Not checking.   Diabetes Medications: Metformin AVG BS: 120s Low BS: 101 High BS:131 Polyurea/Polydypsia:No Last A1c:6.6  Smoking: Currently 2 cigs daily.  Quit date is Nov 26th. has nicotine gum for cravings.   Habits & Providers  Alcohol-Tobacco-Diet     Tobacco Status: current     Tobacco Counseling: to quit use of tobacco products     Cigarette Packs/Day: <0.25     Year Started: 1973     Pack years: 9.50  Current Problems (verified): 1)  Episodic Tension Type Headache  (ICD-339.11) 2)  Gait Imbalance  (ICD-781.2) 3)  Hypertension, Benign Systemic  (ICD-401.1) 4)  Hypercholesterolemia  (ICD-272.0) 5)  Diabetes Mellitus, Type II  (ICD-250.00) 6)  COPD  (ICD-496) 7)  Leukemia, Lymphocytic, Chronic  (ICD-204.10) 8)  Low Back Pain Syndrome, Severe  (ICD-724.2) 9)  Adjustment Disorder With Depressed Mood  (ICD-309.0) 10)  Routine Gynecological Examination  (ICD-V72.31) 11)  Special Screening For Malignant Neoplasms Colon  (ICD-V76.51) 12)  Screening For  Malignant Neoplasm of The Cervix  (ICD-V76.2) 13)  Peripheral Vascular Disease  (ICD-443.9) 14)  Tobacco Dependence  (ICD-305.1) 15)  Gastroesophageal Reflux, No Esophagitis  (ICD-530.81) 16)  Anxiety  (ICD-300.00)  Current Medications (verified): 1)  Plavix 75 Mg  Tabs (Clopidogrel Bisulfate) .Marland Kitchen.. 1 By Mouth Daily 2)  Norvasc 10 Mg Tabs (Amlodipine Besylate) .Marland Kitchen.. 1 By Mouth Daily 3)  Zocor 20 Mg Tabs (Simvastatin) .Marland Kitchen.. 1 By Mouth Qhs 4)  Coreg 6.25 Mg  Tabs (Carvedilol) .... One Tab By Mouth Bid 5)  Enalapril Maleate 20 Mg Tabs (Enalapril Maleate) .... Take 1 Tablet By Mouth Once A Day 6)  Metformin Hcl 500 Mg Tabs (Metformin Hcl) .Marland Kitchen.. 1 By Mouth Two Times A Day 7)  Advair Diskus 500-50 Mcg/dose Misc (Fluticasone-Salmeterol) .... Inhale 1 Puff Twice Daily 8)  Singulair 10 Mg Tabs (Montelukast Sodium) .... Take 1 Tablet By Mouth At Bedtime 9)  Flonase 50 Mcg/act Susp (Fluticasone Propionate) .... Spray 1 Spray Into Both Nostrils Once A Day As Needed 10)  Ultram 50 Mg  Tabs (Tramadol Hcl) .... 2 Tabs Twice Daily For Pain As Needed 11)  Flexeril 10 Mg Tabs (Cyclobenzaprine Hcl) .... One By Mouth Two Times A Day As Needed Muscle Spasm 12)  Onetouch Ultrasoft Lancets  Misc (Lancets) .... Use As Directed 13)  Onetouch Ultra Test  Strp (Glucose Blood) .... Use As Directed, Qs 1 Month 14)  Cilostazol 50 Mg Tabs (Cilostazol) .... Dose Per Vasc Surg 15)  Proair Hfa 108 (90 Base) Mcg/act Aers (Albuterol Sulfate) .... 2 Puffs At Bedtime and As Needed Shortness of Breath. 16)  Qvar 40 Mcg/act Aers (Beclomethasone Dipropionate) .... 2 Puffs At Bedtime 17)  Hydrochlorothiazide 25 Mg Tabs (Hydrochlorothiazide) .Marland Kitchen.. 1 By Mouth Daily  Allergies (verified): 1)  ! Aspirin  Past History:  Past Medical History: Last updated: 06/18/2010  brain tumor - meningioma removed 1998-[facility], H/o dysphagia--resolved with esoph dilation  R CVA s/p meningioma removal Current Problems:  DIABETES MELLITUS, TYPE II  (ICD-250.00) --> [facility] LEUKEMIA, LYMPHOCYTIC, CHRONIC (ICD-204.10) found on mammo with abnormal LN after biopsy 2008/05/08 (stage 0, indolent) LOW BACK PAIN SYNDROME, SEVERE (ICD-724.2) ADJUSTMENT DISORDER WITH DEPRESSED MOOD (ICD-309.0) ROUTINE GYNECOLOGICAL EXAMINATION (ICD-V72.31) SPECIAL SCREENING FOR MALIGNANT NEOPLASMS COLON (ICD-V76.51) SCREENING FOR MALIGNANT NEOPLASM OF THE CERVIX (ICD-V76.2) PERIPHERAL VASCULAR DISEASE (ICD-443.9) - ABI 0.68 05-08-2010, h/o bypass RLE TOBACCO DEPENDENCE (ICD-305.1) HYPERTENSION, BENIGN SYSTEMIC (ICD-401.1) HYPERCHOLESTEROLEMIA (ICD-272.0) GASTROESOPHAGEAL REFLUX, NO ESOPHAGITIS (ICD-530.81) COPD (ICD-496) ANXIETY (ICD-300.00)  Past Surgical History: Last updated: 07/22/2009 Cardiac Cath: normal coronary arteries 02/24/2002 Cholecystectomy - 03/27/2002 EGD: esophageal dilation (Dr. Madilyn Fireman); esophagus was diffusely dilated distally w/ abrupt taper into LES-?achalasia - 01/28/2004 Spirometry: mod obstr dz w/ mod improvement post-bronchodilator - 03/27/2004 Bypass grafting of RLE for PAD claudication - SFA patent with popliteal stent in place (12/28/2006) Cardiac Cath: normal CA 04/05/2007  Family History: Last updated: 02/23/2007 `diabetes in several family members`, Brother--IDDM, Father--unknown (was not involved with her), Mother--deceased 05-08-2004; CAD, Unsure about other family history  Social History: Last updated: 10/21/2010 2 cigs dailyk.  h/o quitting in past.  Lives with husband, 3 grandchildren in house; Denies alcohol, illicit drugs Brother passed away 02/08/2007, sister had a stroke.  Stress with grandchildren. Married  20+ year history of working in [employer] with exposure to [employer] fibers  Risk Factors: Smoking Status: current (10/21/2010) Packs/Day: <0.25 (10/21/2010)  Social History: 2 cigs dailyk.  h/o quitting in past.  Lives with husband, 3 grandchildren in house; Denies alcohol,  illicit drugs Brother passed away 2007-02-08, sister had a stroke.  Stress with grandchildren. Married  20+ year history of working in [employer] with exposure to [employer] fibers  Review of Systems  The patient denies anorexia, fever, weight loss, chest pain, syncope, hemoptysis, abdominal pain, melena, severe indigestion/heartburn, suspicious skin lesions, and depression.    Physical Exam  General:  VS noted.  Well NAD Head:  Normocephalic and atraumatic without obvious abnormalities. No apparent alopecia or balding. Eyes:  PERRL EOMI, sclera and conjentiva clear Ears:  no external deformities.   Nose:  no external deformity.   Mouth:  moist, teeth missing, no lesions noted in lips, gums or palpate Lungs:  Normal respiratory effort, chest expands symmetrically. Lungs are clear to auscultation, no crackles or wheezes. Heart:  normal S1, S2, no m/r/g Abdomen:  Bowel sounds positive,abdomen soft and non-tender without masses, organomegaly or hernias noted. Extremities:  Lower extremities Physical Exam includes: Cool to palpation, delayed capillary refill, diminished pulses, absence of limb hair,   Cervical Nodes:  No lymphadenopathy noted Axillary Nodes:  No palpable lymphadenopathy   Impression & Recommendations:  Problem # 1:  HYPERTENSION, BENIGN SYSTEMIC (ICD-401.1) Assessment Unchanged  BP elevated today.  Plan to start HCTZ and have pt check BP at pharmacy every week x 3 weeks.  Normal ranges, red flags and precautions given. pt voices understanding.  Will  follow up in 1 month is not improved.    Her updated medication list for this problem includes:    Norvasc 10 Mg Tabs (Amlodipine besylate) .Marland Kitchen... 1 by mouth daily    Coreg 6.25 Mg Tabs (Carvedilol) ..... One tab by mouth bid    Enalapril Maleate 20 Mg Tabs (Enalapril maleate) .Marland Kitchen... Take 1 tablet by mouth once a day    Hydrochlorothiazide 25 Mg Tabs (Hydrochlorothiazide) .Marland Kitchen... 1 by mouth daily  BP today: 156/90 Prior BP:  156/82 (10/13/2010)  Prior 10 Yr Risk Heart Disease: > 32 % (08/25/2009)  Labs Reviewed: K+: 3.9 (09/24/2010) Creat: : 0.87 (09/24/2010)   Chol: 208 (07/22/2009)   HDL: 34 (07/22/2009)   LDL: 136 (07/22/2009)   TG: 191 (07/22/2009)  Orders: [facility]- Est  Level 4 ([address])  Problem # 2:  COPD (ICD-496) Assessment: Unchanged New diagnosis recently.  Due to smoking. On ICS. If not well controlled with start ipitropian or tiotripian.  Will stop QVAR when RX runs out.  If worse will restart it.  Will repeat Pneumococcal vaccine vaccine today as has been >5 years and patient has risk factors such as  COPD and DM.   Her updated medication list for this problem includes:    Advair Diskus 500-50 Mcg/dose Misc (Fluticasone-salmeterol) ..... Inhale 1 puff twice daily    Singulair 10 Mg Tabs (Montelukast sodium) .Marland Kitchen... Take 1 tablet by mouth at bedtime    Proair Hfa 108 (90 Base) Mcg/act Aers (Albuterol sulfate) .Marland Kitchen... 2 puffs at bedtime and as needed shortness of breath.    Qvar 40 Mcg/act Aers (Beclomethasone dipropionate) .Marland Kitchen... 2 puffs at bedtime  Orders: [facility]- Est  Level 4 ([address])  Problem # 3:  DIABETES MELLITUS, TYPE II (ICD-250.00) Doing well. Checked meter today. Daily to weekly and as needed glucose checks.  To optho today for eye exam.   Her updated medication list for this problem includes:    Enalapril Maleate 20 Mg Tabs (Enalapril maleate) .Marland Kitchen... Take 1 tablet by mouth once a day    Metformin Hcl 500 Mg Tabs (Metformin hcl) .Marland Kitchen... 1 by mouth two times a day  Orders: Ophthalmology Referral (Ophthalmology) [facility]- Est  Level 4 [phone])  Labs Reviewed: Creat: 0.87 (09/24/2010)    Reviewed HgBA1c results: 6.6 (08/20/2010)  6.9 (03/11/2010)  Problem # 4:  TOBACCO DEPENDENCE (ICD-305.1) Assessment: Unchanged  Has quit date in sight.  Nov 26th. Will follow up with patient.   Orders: [facility]- Est  Level 4 ([address]52)  Problem # 5:  Preventive Health Care (ICD-V70.0) Will refer to colonoscopy,  mamogram and follow up.   Complete Medication List: 1)  Plavix 75 Mg Tabs (Clopidogrel bisulfate) .Marland Kitchen.. 1 by mouth daily 2)  Norvasc 10 Mg Tabs (Amlodipine besylate) .Marland Kitchen.. 1 by mouth daily 3)  Zocor 20 Mg Tabs (Simvastatin) .Marland Kitchen.. 1 by mouth qhs 4)  Coreg 6.25 Mg Tabs (Carvedilol) .... One tab by mouth bid 5)  Enalapril Maleate 20 Mg Tabs (Enalapril maleate) .... Take 1 tablet by mouth once a day 6)  Metformin Hcl 500 Mg Tabs (Metformin hcl) .Marland Kitchen.. 1 by mouth two times a day 7)  Advair Diskus 500-50 Mcg/dose Misc (Fluticasone-salmeterol) .... Inhale 1 puff twice daily 8)  Singulair 10 Mg Tabs (Montelukast sodium) .... Take 1 tablet by mouth at bedtime 9)  Flonase 50 Mcg/act Susp (Fluticasone propionate) .... Spray 1 spray into both nostrils once a day as needed 10)  Ultram 50 Mg Tabs (Tramadol hcl) .... 2 tabs twice daily for pain  as needed 11)  Flexeril 10 Mg Tabs (Cyclobenzaprine hcl) .... One by mouth two times a day as needed muscle spasm 12)  Onetouch Ultrasoft Lancets Misc (Lancets) .... Use as directed 13)  Onetouch Ultra Test Strp (Glucose blood) .... Use as directed, qs 1 month 14)  Cilostazol 50 Mg Tabs (Cilostazol) .... Dose per vasc surg 15)  Proair Hfa 108 (90 Base) Mcg/act Aers (Albuterol sulfate) .... 2 puffs at bedtime and as needed shortness of breath. 16)  Qvar 40 Mcg/act Aers (Beclomethasone dipropionate) .... 2 puffs at bedtime 17)  Hydrochlorothiazide 25 Mg Tabs (Hydrochlorothiazide) .Marland Kitchen.. 1 by mouth daily  Other Orders: Colonoscopy (Colon) Mammogram (Screening) (Mammo) Pneumococcal Vaccine ([address]) Admin 1st Vaccine ([address])  Patient Instructions: 1)  Thank you for seeing me today. 2)  Pick up and start taking your HCTZ. 3)  Check your BP weekly at the drug store. If it is less 120/70 let me know.  Also let me know if you are getting dizzy. 4)  Go for your Mamogram, Colonscopy and Eye doctor. 5)  Follow up with me in 4-6 months or sooner if needed.   Prescriptions: HYDROCHLOROTHIAZIDE 25 MG TABS (HYDROCHLOROTHIAZIDE) 1 by mouth daily  #30 x 3   Entered and Authorized by:   Clementeen Graham MD   Signed by:   Clementeen Graham MD on 10/21/2010   Method used:   Electronically to        [facility] [phone]* (retail)       [address], [place]  [address]       Ph: [phone] or [phone]       Fax: [phone]   RxID:   [id number]    Orders Added: 1)  Colonoscopy [Colon] 2)  Mammogram (Screening) [Mammo] 3)  Pneumococcal Vaccine [90732] 4)  Admin 1st Vaccine [90471] 5)  Ophthalmology Referral [Ophthalmology] 6)  [facility]- Est  Level 4 [[address]]   Immunizations Administered:  Pneumonia Vaccine:    Vaccine Type: Pneumovax    Site: left deltoid    Mfr: Merck    Dose: 0.5 ml    Route: IM    Given by: Starleen Blue RN    Exp. Date: 04/19/2012    Lot #: [id number]    VIS given: 12/01/09 version given October 21, 2010.   Immunizations Administered:  Pneumonia Vaccine:    Vaccine Type: Pneumovax    Site: left deltoid    Mfr: Merck    Dose: 0.5 ml    Route: IM    Given by: Starleen Blue RN    Exp. Date: 04/19/2012    Lot #: [id number]    VIS given: 12/01/09 version given October 21, 2010.   Prevention & Chronic Care Immunizations   Influenza vaccine: Fluvax Non-MCR  (09/24/2010)   Influenza vaccine deferral: Not indicated  (10/21/2010)   Influenza vaccine due: 08/27/2010    Tetanus booster: 09/26/2002: Done.   Tetanus booster due: 09/26/2012    Pneumococcal vaccine: Pneumovax  (10/21/2010)   Pneumococcal vaccine due: 10/27/2006    H. zoster vaccine: Not documented  Colorectal Screening   Hemoccult: Done.  (02/24/1997)   Hemoccult action/deferral: Not indicated  (10/21/2010)   Hemoccult due: 02/24/1998    Colonoscopy: Not documented   Colonoscopy action/deferral: GI Referral  (10/21/2010)  Other Screening   Pap smear: normal  (10/01/2008)   Pap smear action/deferral: Deferred-3 yr interval   (04/13/2010)   Pap smear due: 10/02/2011    Mammogram: BI-RADS  CATEGORY 4:  Suspicious abnormality - biopsy should be^MM DIGITAL DIAG LTD R  (10/22/2008)   Mammogram action/deferral: Ordered  (10/21/2010)   Mammogram due: 10/22/2009    DXA bone density scan: Not documented   Smoking status: current  (10/21/2010)   Smoking cessation counseling: YES  (09/24/2010)   Target quit date: 05/27/2010  (04/21/2010)  Diabetes Mellitus   HgbA1C: 6.6  (08/20/2010)   Hemoglobin A1C due: 06/09/2007    Eye exam: Not documented   Diabetic eye exam action/deferral: Ophthalmology referral  (10/21/2010)    Foot exam: yes  (07/22/2009)   Foot exam action/deferral: Deferred   High risk foot: Not documented   Foot care education: Done  (07/22/2009)   Foot exam due: 11/25/2009    Urine microalbumin/creatinine ratio: Not documented    Diabetes flowsheet reviewed?: Yes   Progress toward A1C goal: At goal  Lipids   Total Cholesterol: 208  (07/22/2009)   Lipid panel action/deferral: Not indicated   LDL: 136  (07/22/2009)   LDL Direct: 62  (09/24/2010)   HDL: 34  (07/22/2009)   Triglycerides: 191  (07/22/2009)    SGOT (AST): 19  (03/11/2010)   SGPT (ALT): 21  (03/11/2010)   Alkaline phosphatase: 116  (03/11/2010)   Total bilirubin: 0.3  (03/11/2010)    Lipid flowsheet reviewed?: Yes   Progress toward LDL goal: At goal  Hypertension   Last Blood Pressure: 156 / 90  (10/21/2010)   Serum creatinine: 0.87  (09/24/2010)   Serum potassium 3.9  (09/24/2010)   Basic metabolic panel due: 09/16/2009    Hypertension flowsheet reviewed?: Yes   Progress toward BP goal: Unchanged  Self-Management Support :   Personal Goals (by the next clinic visit) :     Personal A1C goal: 8  (12/10/2009)     Personal blood pressure goal: 140/90  (08/25/2009)     Personal LDL goal: 100  (08/25/2009)    Patient will work on the following items until the next clinic visit to reach self-care goals:     Medications  and monitoring: take my medicines every day, check my blood sugar, check my blood pressure  (10/21/2010)     Eating: eat more vegetables  (08/25/2009)    Diabetes self-management support: Written self-care plan  (05/15/2010)    Diabetes self-management support not done because: Good outcomes  (04/13/2010)    Hypertension self-management support: Written self-care plan  (05/15/2010)    Hypertension self-management support not done because: Good outcomes  (04/13/2010)    Lipid self-management support: Written self-care plan  (05/15/2010)     Lipid self-management support not done because: Good outcomes  (04/21/2010)   Nursing Instructions: Screening colonoscopy ordered Schedule screening mammogram (see order) Refer for screening diabetic eye exam (see order) Give Pneumovax today

## 2011-01-26 NOTE — Progress Notes (Signed)
Summary: Triage  Phone Note Call from Patient Call back at [phone]   Summary of Call: Pt would like to speak with a nurse about having migranes that are reoccuring. Initial call taken by: Haydee Salter,  March 05, 2008 8:48 AM  Follow-up for Phone Call        Spoke with pt- she states she gets headaches in the back of her head everyday.  States she does not have nausea or sensitivity to light, but does have blurred vision at times.  States she take ibuprofen for them which helps a small amount.  Advised pt she does need to be seen since she has them everyday.  She does want to see Dr. Luz Brazen, advised she has one opening today, and pt wanted her grandson to be seen today.  Advised I will talk with Dr. Luz Brazen today, and see what she suggests. Follow-up by: Demaria Deeney MARTIN RN,  March 05, 2008 9:09 AM  Additional Follow-up for Phone Call Additional follow up Details #1::        Advised pt if her headaches worsen or she feels she needs to be seen before 03/27/08 at her appt with Dr. Luz Brazen call back and she can be worked in.  Pt states she wants to try to wait to be seen 03/27/08 so that she can seen Dr. Luz Brazen. Additional Follow-up by: Kallin Henk MARTIN RN,  March 05, 2008 2:22 PM
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
## 2011-01-26 NOTE — Assessment & Plan Note (Signed)
Summary: F/U VISIT/[facility]   Vital Signs:  Patient profile:   65 year old female Height:      61.5 inches Weight:      157 pounds BMI:     29.29 BSA:     1.72 Temp:     97.8 degrees F Pulse rate:   67 / minute BP sitting:   120 / 72  Vitals Entered By: Jone Baseman CMA (April 13, 2010 4:09 PM) CC: f/u Is Patient Diabetic? No Pain Assessment Patient in pain? yes     Location: legs Intensity: 7   Primary Care Provider:  Eustaquio Boyden  MD  CC:  f/u.  History of Present Illness: CC: f/u issues  Presents with granddaughter niree.  Reports compliance with meds.  Filled out handicap placard indication: uses walker/cane, cannot walk >23ft without stopping.  Medications reviewed and updated in medication list.  Review of systems as above.   1. leg pain - only takes one tramadol at night.  h/o RLE claudication leading to stent placed SFA.  Now states has pain in bilateral legs, calves, pain starts after walking certain distances.  distressing because limits activity.   2. HTN - Brings meds and reports compliance today.  3. DM - compliant with meds.  Last A1c at goal.  4. tobacco - down to 1/4 ppd.   5. HLD - LDL at goal after simvastatin restarted.  6. falls - on tramadol as needed, takes 10mg  flexeril twice daily.  States falls better after starting to use 4 point cane, but left in car today.  ALso uses walker as needed.  Completed PT for imbalance.   Habits & Providers  Alcohol-Tobacco-Diet     Tobacco Status: current     Tobacco Counseling: to quit use of tobacco products     Cigarette Packs/Day: 0.25  Current Medications (verified): 1)  Plavix 75 Mg  Tabs (Clopidogrel Bisulfate) .Marland Kitchen.. 1 By Mouth Daily 2)  Norvasc 10 Mg Tabs (Amlodipine Besylate) .Marland Kitchen.. 1 By Mouth Daily 3)  Zocor 80 Mg  Tabs (Simvastatin) .Marland Kitchen.. 1 By Mouth Nightly 4)  Coreg 6.25 Mg  Tabs (Carvedilol) .... One Tab By Mouth Bid 5)  Enalapril Maleate 20 Mg Tabs (Enalapril Maleate) .... Take 1 Tablet By  Mouth Once A Day 6)  Metformin Hcl 500 Mg Tabs (Metformin Hcl) .Marland Kitchen.. 1 By Mouth Two Times A Day 7)  Advair Diskus 500-50 Mcg/dose Misc (Fluticasone-Salmeterol) .... Inhale 1 Puff As Directed Twice A Day 8)  Albuterol 90 Mcg/act Aers (Albuterol) .... Inhale 2 Puff As Directed Four Times A Day 9)  Singulair 10 Mg Tabs (Montelukast Sodium) .... Take 1 Tablet By Mouth At Bedtime 10)  Flonase 50 Mcg/act Susp (Fluticasone Propionate) .... Spray 1 Spray Into Both Nostrils Once A Day 11)  Ultram 50 Mg  Tabs (Tramadol Hcl) .... 2 Tabs Twice Daily For Pain As Needed 12)  Flexeril 10 Mg Tabs (Cyclobenzaprine Hcl) .... One By Mouth Two Times A Day As Needed Muscle Spasm 13)  Cvs Nicotine Polacrilex 2 Mg Gum (Nicotine Polacrilex) .Marland Kitchen.. 1 Piece Every 4 Hours As Needed For Cigarette Cravings Disp: Qs X One Month 14)  Onetouch Ultrasoft Lancets  Misc (Lancets) .... Use As Directed 15)  Onetouch Ultra Test  Strp (Glucose Blood) .... Use As Directed, Qs 1 Month  Allergies: 1)  ! Aspirin  Past History:  Past medical, surgical, family and social histories (including risk factors) reviewed for relevance to current acute and chronic problems.  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 LEUKEMIA, LYMPHOCYTIC, CHRONIC (ICD-204.10) found on mammo with abnormal LN after biopsy May 10, 2008 LOW BACK PAIN SYNDROME, SEVERE (ICD-724.2) ADJUSTMENT DISORDER WITH DEPRESSED MOOD (ICD-309.0) ROUTINE GYNECOLOGICAL EXAMINATION (ICD-V72.31) SPECIAL SCREENING FOR MALIGNANT NEOPLASMS COLON (ICD-V76.51) SCREENING FOR MALIGNANT NEOPLASM OF THE CERVIX (ICD-V76.2) PERIPHERAL VASCULAR DISEASE (ICD-443.9) TOBACCO DEPENDENCE (ICD-305.1) HYPERTENSION, BENIGN SYSTEMIC (ICD-401.1) HYPERCHOLESTEROLEMIA  (ICD-272.0) GASTROESOPHAGEAL REFLUX, NO ESOPHAGITIS (ICD-530.81) COPD (ICD-496) ANXIETY (ICD-300.00)  Past Surgical History: Reviewed history from 07/22/2009 and no changes required. Cardiac Cath: normal coronary arteries 02/24/2002 Cholecystectomy - 03/27/2002 EGD: esophageal dilation (Dr. Madilyn Fireman); esophagus was diffusely dilated distally w/ abrupt taper into LES-?achalasia - 01/28/2004 Spirometry: mod obstr dz w/ mod improvement post-bronchodilator - 03/27/2004 Bypass grafting of RLE for PAD claudication - SFA patent with popliteal stent in place (12/28/2006) Cardiac Cath: normal CA 04/05/2007  Family History: Reviewed history from 02/23/2007 and no changes required. `diabetes in several family members`, Brother--IDDM, Father--unknown (was not involved with her), Mother--deceased 05-10-04; CAD, Unsure about other family history  Social History: Reviewed history from 09/25/2009 and no changes required. 1 pack every 3 days.  h/o quitting in past.  Lives with husband, 3 grandchildren in house; Denies alcohol, illicit drugs Brother passed away Feb 10, 2007, sister had a stroke.  Stress with grandchildren. Married  Physical Exam  General:  alert, NAD, interactive Lungs:  Normal respiratory effort, chest expands symmetrically. Lungs are clear to auscultation, no crackles or wheezes. Heart:  normal S1, S2, no m/r/g Extremities:  no edema, tenderness. Skin:  Intact without suspicious lesions or rashes   Impression & Recommendations:  Problem # 1:  HYPERTENSION, BENIGN SYSTEMIC (ICD-401.1) Assessment Improved  Her updated medication list for this problem includes:    Norvasc 10 Mg Tabs (Amlodipine besylate) .Marland Kitchen... 1 by mouth daily    Coreg 6.25 Mg Tabs (Carvedilol) ..... One tab by mouth bid    Enalapril Maleate 20 Mg Tabs (Enalapril maleate) .Marland Kitchen... Take 1 tablet by mouth once a day  BP today: 120/72 Prior BP: 180/93 (03/11/2010)  Prior 10 Yr Risk Heart Disease: > 32 % (08/25/2009)  Labs  Reviewed: K+: 4.2 (03/11/2010) Creat: : 1.03 (03/11/2010)   Chol: 208 (07/22/2009)   HDL: 34 (07/22/2009)   LDL: 136 (07/22/2009)   TG: 191 (07/22/2009)  Orders: [facility]- Est  Level 4 (99214)  Problem # 2:  HYPERCHOLESTEROLEMIA (ICD-272.0) Assessment: Improved  Her updated medication list for this problem includes:    Zocor 80 Mg Tabs (Simvastatin) .Marland Kitchen... 1 by mouth nightly  Labs Reviewed: SGOT: 19 (03/11/2010)   SGPT: 21 (03/11/2010)  Lipid Goals: Chol Goal: 200 (08/25/2009)   HDL Goal: 40 (08/25/2009)   LDL Goal: 70 (08/25/2009)   TG Goal: 150 (08/25/2009)  Prior 10 Yr Risk Heart Disease: > 32 % (08/25/2009)   HDL:34 (07/22/2009), 36 (06/12/2008)  LDL:136 (07/22/2009), 126 (06/12/2008)  Chol:208 (07/22/2009), 238 (06/12/2008)  Trig:191 (07/22/2009), 379 (06/12/2008)  Orders: [facility]- Est  Level 4 ([address])  Problem # 3:  DIABETES MELLITUS, TYPE II (ICD-250.00) Assessment: Improved  Her updated medication list for this problem includes:    Enalapril Maleate 20 Mg Tabs (Enalapril maleate) .Marland Kitchen... Take 1 tablet by mouth once a day    Metformin Hcl 500 Mg Tabs (Metformin hcl) .Marland Kitchen... 1 by mouth two times a day  Labs Reviewed: Creat: 1.03 (03/11/2010)    Reviewed  HgBA1c results: 6.9 (03/11/2010)  7.0 (12/03/2009)  Orders: [facility]- Est  Level 4 (14782)  Problem # 4:  TOBACCO DEPENDENCE (ICD-305.1)  Her updated medication list for this problem includes:    Cvs Nicotine Polacrilex 2 Mg Gum (Nicotine polacrilex) .Marland Kitchen... 1 piece every 4 hours as needed for cigarette cravings disp: qs x one month  Encouraged smoking cessation and discussed different methods for smoking cessation.   Problem # 5:  PERIPHERAL VASCULAR DISEASE (ICD-443.9)  h/o PAD s/p SFA stent in RLE.  concern for worsening claudication sxs, so sent to [facility] for ABIs and recommendation of pletal?  (already on plavix and ASA causes GI upset) Her updated medication list for this problem includes:    Plavix 75 Mg Tabs (Clopidogrel  bisulfate) .Marland Kitchen... 1 by mouth daily  Orders: [facility]- Est  Level 4 (95621)  Problem # 6:  LEUKEMIA, LYMPHOCYTIC, CHRONIC (ICD-204.10)  patient had mammogram in fall 2009, found lymph node, biopsied, and found to be consistent with CLL.  Sees Dr. Arline Asp with heme/onc.  They are following her labs for right now.    Complete Medication List: 1)  Plavix 75 Mg Tabs (Clopidogrel bisulfate) .Marland Kitchen.. 1 by mouth daily 2)  Norvasc 10 Mg Tabs (Amlodipine besylate) .Marland Kitchen.. 1 by mouth daily 3)  Zocor 80 Mg Tabs (Simvastatin) .Marland Kitchen.. 1 by mouth nightly 4)  Coreg 6.25 Mg Tabs (Carvedilol) .... One tab by mouth bid 5)  Enalapril Maleate 20 Mg Tabs (Enalapril maleate) .... Take 1 tablet by mouth once a day 6)  Metformin Hcl 500 Mg Tabs (Metformin hcl) .Marland Kitchen.. 1 by mouth two times a day 7)  Advair Diskus 500-50 Mcg/dose Misc (Fluticasone-salmeterol) .... Inhale 1 puff as directed twice a day 8)  Albuterol 90 Mcg/act Aers (Albuterol) .... Inhale 2 puff as directed four times a day 9)  Singulair 10 Mg Tabs (Montelukast sodium) .... Take 1 tablet by mouth at bedtime 10)  Flonase 50 Mcg/act Susp (Fluticasone propionate) .... Spray 1 spray into both nostrils once a day 11)  Ultram 50 Mg Tabs (Tramadol hcl) .... 2 tabs twice daily for pain as needed 12)  Flexeril 10 Mg Tabs (Cyclobenzaprine hcl) .... One by mouth two times a day as needed muscle spasm 13)  Cvs Nicotine Polacrilex 2 Mg Gum (Nicotine polacrilex) .Marland Kitchen.. 1 piece every 4 hours as needed for cigarette cravings disp: qs x one month 14)  Onetouch Ultrasoft Lancets Misc (Lancets) .... Use as directed 15)  Onetouch Ultra Test Strp (Glucose blood) .... Use as directed, qs 1 month  Patient Instructions: 1)  Schedule ABIs at pharmacy clinic. 2)  Come back to see me in 1 month. 3)  Thank you for bringing all your meds today. 4)  Try to cut back on the flexeril as it can cause you to get dizzy and fall.  (one pill a day as needed).  Continue tramadol for leg pain. 5)   Quitting smoking will help your leg pain. Prescriptions: FLEXERIL 10 MG TABS (CYCLOBENZAPRINE HCL) one by mouth two times a day as needed muscle spasm  #40 x 0   Entered and Authorized by:   Eustaquio Boyden  MD   Signed by:   Eustaquio Boyden  MD on 04/13/2010   Method used:   Electronically to        [facility]* (mail-order)       [address], [place]  [address]       Ph:  [phone]       Fax: [phone]   RxID:   [id number] ULTRAM 50 MG  TABS (TRAMADOL HCL) 2 tabs twice daily for pain as needed  #180 x 1   Entered and Authorized by:   Eustaquio Boyden  MD   Signed by:   Eustaquio Boyden  MD on 04/13/2010   Method used:   Electronically to        [facility]* (mail-order)       [address]       [place], [place]  [address]       Ph: [phone]       Fax: [phone]   RxID:   [id number] SINGULAIR 10 MG TABS (MONTELUKAST SODIUM) Take 1 tablet by mouth at bedtime  #90 x 3   Entered and Authorized by:   Eustaquio Boyden  MD   Signed by:   Eustaquio Boyden  MD on 04/13/2010   Method used:   Electronically to        [facility]* (mail-order)       [address]       [place], [place]  [address]       Ph: [phone]       Fax: [phone]   RxID:   [id number] METFORMIN HCL 500 MG TABS (METFORMIN HCL) 1 by mouth two times a day  #180 x 3   Entered and Authorized by:   Eustaquio Boyden  MD   Signed by:   Eustaquio Boyden  MD on 04/13/2010   Method used:   Electronically to        [facility]* (mail-order)       [address]       [place], [place]  [address]       Ph: [phone]       Fax: [phone]   RxID:   [id number] ENALAPRIL MALEATE 20 MG TABS (ENALAPRIL MALEATE) Take 1 tablet by mouth once a day  #90 x 3   Entered and Authorized by:   Eustaquio Boyden  MD   Signed by:   Eustaquio Boyden  MD on 04/13/2010   Method used:   Electronically to         [facility]* (mail-order)       [address]       [place], [place]  [address]       Ph: [phone]       Fax: [phone]   RxID:   [id number] COREG 6.25 MG  TABS (CARVEDILOL) One tab by mouth BID  #180 x 3   Entered and Authorized by:   Eustaquio Boyden  MD   Signed by:   Eustaquio Boyden  MD on 04/13/2010   Method used:   Electronically to        [facility]* (mail-order)       [address]       [place], [place]  [address]       Ph: [phone]       Fax: [phone]   RxID:   [id number] ZOCOR 80 MG  TABS (SIMVASTATIN) 1 by mouth nightly  #90 x 3   Entered and Authorized by:   Eustaquio Boyden  MD   Signed by:   Eustaquio Boyden  MD on 04/13/2010   Method used:   Electronically to        [facility]* (mail-order)       [address]       [place], [place]  [address]       Ph: [phone]       Fax: [phone]   RxID:   [id number] NORVASC 10 MG TABS (AMLODIPINE BESYLATE) 1 by mouth daily  #90 x 3   Entered and Authorized by:   Eustaquio Boyden  MD   Signed by:   Eustaquio Boyden  MD on 04/13/2010   Method used:   Electronically to        [facility]* (mail-order)       [address]       [place], [place]  [address]       Ph: [phone]       Fax: [phone]   RxID:   [id number] PLAVIX 75 MG  TABS (CLOPIDOGREL BISULFATE) 1 by mouth daily  #90 x 3   Entered and Authorized by:   Eustaquio Boyden  MD   Signed by:   Eustaquio Boyden  MD on 04/13/2010   Method used:   Electronically to        [facility]* (mail-order)       [address]       [place], [place]  [address]       Ph: [phone]       Fax: [phone]   RxID:   [id number]    Prevention & Chronic Care Immunizations   Influenza vaccine: Fluvax Non-MCR  (09/25/2009)   Influenza vaccine deferral: Deferred  (08/25/2009)   Influenza vaccine due: 08/27/2010    Tetanus booster:  09/26/2002: Done.   Tetanus booster due: 09/26/2012    Pneumococcal vaccine: Done.  (10/27/2001)   Pneumococcal vaccine due: None    H. zoster vaccine: Not documented  Colorectal Screening   Hemoccult: Done.  (02/24/1997)   Hemoccult action/deferral: Ordered  (09/25/2009)   Hemoccult due: 02/24/1998    Colonoscopy: Not documented   Colonoscopy action/deferral: Deferred  (07/22/2009)  Other Screening   Pap smear: normal  (10/01/2008)   Pap smear action/deferral: Deferred-3 yr interval  (04/13/2010)   Pap smear due: 10/02/2011    Mammogram: BI-RADS CATEGORY 4:  Suspicious abnormality - biopsy should be^MM DIGITAL DIAG LTD R  (10/22/2008)   Mammogram due: 01/27/2005    DXA bone density scan: Not documented   Smoking status: current  (04/13/2010)   Smoking cessation counseling: yes  (12/05/2008)  Diabetes Mellitus   HgbA1C: 6.9  (03/11/2010)   Hemoglobin A1C due: 06/09/2007    Eye exam: Not documented   Diabetic eye exam action/deferral: Ophthalmology referral  (07/22/2009)    Foot exam: yes  (07/22/2009)   Foot exam action/deferral: Deferred   High risk foot: Not documented   Foot care education: Done  (07/22/2009)   Foot exam due: 11/25/2009    Urine microalbumin/creatinine ratio: Not documented    Diabetes flowsheet reviewed?: Yes   Progress toward A1C goal: At goal  Lipids   Total Cholesterol: 208  (07/22/2009)   LDL: 136  (07/22/2009)   LDL Direct: 54  (03/11/2010)   HDL: 34  (07/22/2009)   Triglycerides: 191  (07/22/2009)    SGOT (AST): 19  (03/11/2010)   SGPT (ALT): 21  (03/11/2010)   Alkaline phosphatase: 116  (03/11/2010)   Total bilirubin: 0.3  (03/11/2010)    Lipid flowsheet reviewed?: Yes   Progress toward LDL goal: At goal  Hypertension   Last Blood Pressure: 120 / 72  (04/13/2010)   Serum creatinine: 1.03  (03/11/2010)   Serum  potassium 4.2  (03/11/2010)   Basic metabolic panel due: 09/16/2009    Hypertension flowsheet reviewed?: Yes    Progress toward BP goal: At goal  Self-Management Support :   Personal Goals (by the next clinic visit) :     Personal A1C goal: 8  (12/10/2009)     Personal blood pressure goal: 140/90  (08/25/2009)     Personal LDL goal: 100  (08/25/2009)    Diabetes self-management support: [facility] self-monitoring log, Written self-care plan, Education handout  (02/11/2010)    Diabetes self-management support not done because: Good outcomes  (04/13/2010)    Hypertension self-management support: Education handout  (04/13/2010)   Hypertension education handout printed    Hypertension self-management support not done because: Good outcomes  (04/13/2010)    Lipid self-management support: Written self-care plan, Education handout  (02/11/2010)     Lipid self-management support not done because: Good outcomes  (04/13/2010)   Appended Document: F/U VISIT/[facility] needs colonoscopy?

## 2011-01-26 NOTE — Miscellaneous (Signed)
Summary: ROI: [employer]  ROI: [employer]   Imported By: Knox Royalty 08/01/2008 14:47:17  _____________________________________________________________________  External Attachment:    Type:   Image     Comment:   External Document

## 2011-01-26 NOTE — Progress Notes (Signed)
Summary: needs referral  Phone Note Call from Patient Call back at [phone]   Caller: Daughter-Anshivetta Whitsett Summary of Call: would like [facility] to come out to do PT and light duty home care for her mom.   Initial call taken by: De Nurse,  August 07, 2009 11:28 AM  Follow-up for Phone Call        fwd. to dr.gutierrez for review. Follow-up by: Arlyss Repress CMA,,  August 07, 2009 3:09 PM  Additional Follow-up for Phone Call Additional follow up Details #1::        pt needs to see me prior to referral. Additional Follow-up by: Eustaquio Boyden  MD,  August 07, 2009 4:57 PM    Additional Follow-up for Phone Call Additional follow up Details #2::    Called and left message to call me back. Follow-up by: Modesta Messing LPN,  August 08, 2009 12:03 PM  Additional Follow-up for Phone Call Additional follow up Details #3:: Details for Additional Follow-up Action Taken: Daughter notified that her Mom needs to schedule an office visit. Additional Follow-up by: Modesta Messing LPN,  August 11, 2009 10:34 AM

## 2011-01-26 NOTE — Assessment & Plan Note (Signed)
Summary: f/u DM, DM   Vital Signs:  Patient profile:   65 year old female Height:      61.5 inches Weight:      150.50 pounds BMI:     28.08 Temp:     97.6 degrees F oral Pulse rate:   65 / minute Pulse rhythm:   regular BP sitting:   157 / 88  (right arm)  Vitals Entered By: Modesta Messing LPN (August 25, 2009 9:06 AM) CC: follow up visit., Lipid Management Is Patient Diabetic? Yes  Pain Assessment Patient in pain? no        Primary Care Provider:  Eustaquio Boyden  MD  CC:  follow up visit. and Lipid Management.  History of Present Illness: CC: f/u DM, ? personal care services/PT  1. DM - States fasting cbgs have been 80s-130s.  Reviewed log.  No low measures.  doing well with meds.  previously was on aspirin which upset stomache (allergy per our records)  Pt states would like to try aspirin again.  No h/o GI bleed.  2. HTN - compliant with meds.  tolerant of meds, no CP, tightness, SOB.  3. HLD - compliant with Zocor.  4. CAD - nonobstructive CAD by last cath, but pt on plavix for popliteal stent for PAD.  No endorsing sxs of claudication.  states pain starts with walking about 1-2 blocks.  has to stop and rest.  5. COPD - compliant with alb, singulair and advair.  6. CLL - h/o CLL found incidentally after breast mass.    7. preventative med - needs colonoscopy, prefers to do stool cards.  forgot to bring back, provided with another set of cards.  8. balance - not dizzy, lightheade4d or vertigo.  states loses balance at times, esp with walking.  wonders if PT would help this.  States she has been to PT before for leg pain and imbalance, states did not really help.  Lipid Management History:      Positive NCEP/ATP III risk factors include female age 26 years old or older, diabetes, HDL cholesterol less than 40, current tobacco user, hypertension, prior stroke (or TIA), and peripheral vascular disease.  Negative NCEP/ATP III risk factors include no family history for  ischemic heart disease.    Habits & Providers  Alcohol-Tobacco-Diet     Tobacco Status: current  Comments: 3 cigarettes per day  Current Medications (verified): 1)  Advair Diskus 500-50 Mcg/dose Misc (Fluticasone-Salmeterol) .... Inhale 1 Puff As Directed Twice A Day 2)  Albuterol 90 Mcg/act Aers (Albuterol) .... Inhale 2 Puff As Directed Four Times A Day 3)  Enalapril Maleate 20 Mg Tabs (Enalapril Maleate) .... Take 1 Tablet By Mouth Once A Day 4)  Flonase 50 Mcg/act Susp (Fluticasone Propionate) .... Spray 1 Spray Into Both Nostrils Once A Day 5)  Singulair 10 Mg Tabs (Montelukast Sodium) .... Take 1 Tablet By Mouth At Bedtime 6)  Coreg 6.25 Mg  Tabs (Carvedilol) .... One Tab By Mouth Bid 7)  Ultram 50 Mg  Tabs (Tramadol Hcl) .... 2 Tabs Three Times A Day As Needed Pain 8)  Flexeril 10 Mg Tabs (Cyclobenzaprine Hcl) .... One Three Times A Day Prn 9)  Zocor 80 Mg  Tabs (Simvastatin) .Marland Kitchen.. 1 By Mouth Nightly 10)  Plavix 75 Mg  Tabs (Clopidogrel Bisulfate) .Marland Kitchen.. 1 By Mouth Daily 11)  Norvasc 10 Mg Tabs (Amlodipine Besylate) .Marland Kitchen.. 1 By Mouth Daily 12)  Cvs Nicotine Polacrilex 2 Mg Gum (Nicotine Polacrilex) .Marland Kitchen.. 1 Piece  Every 4 Hours As Needed For Cigarette Cravings Disp: Qs X One Month 13)  Metformin Hcl 500 Mg Tabs (Metformin Hcl) .Marland Kitchen.. 1 By Mouth Daily X 5 Days, Then 1 By Mouth Two Times A Day 14)  Onetouch Ultrasoft Lancets  Misc (Lancets) .... Use As Directed 15)  Onetouch Ultra Test  Strp (Glucose Blood) .... Use As Directed, Qs 1 Month  Allergies (verified): 1)  ! Aspirin  Past History:  Past Medical History: Last updated: 07/02/2009  brain tumor - meningioma removed 1998-[facility], H/o dysphagia--resolved with esoph dilation  R CVA s/p meningioma removal Current Problems:  DIABETES MELLITUS, TYPE II (ICD-250.00) --> [facility] LEUKEMIA, LYMPHOCYTIC, CHRONIC (ICD-204.10) LOW BACK PAIN SYNDROME, SEVERE (ICD-724.2) ADJUSTMENT  DISORDER WITH DEPRESSED MOOD (ICD-309.0) ROUTINE GYNECOLOGICAL EXAMINATION (ICD-V72.31) SPECIAL SCREENING FOR MALIGNANT NEOPLASMS COLON (ICD-V76.51) SCREENING FOR MALIGNANT NEOPLASM OF THE CERVIX (ICD-V76.2) PERIPHERAL VASCULAR DISEASE (ICD-443.9) TOBACCO DEPENDENCE (ICD-305.1) HYPERTENSION, BENIGN SYSTEMIC (ICD-401.1) HYPERCHOLESTEROLEMIA (ICD-272.0) GASTROESOPHAGEAL REFLUX, NO ESOPHAGITIS (ICD-530.81) COPD (ICD-496) ANXIETY (ICD-300.00)  Past Surgical History: Last updated: 07/22/2009 Cardiac Cath: normal coronary arteries 02/24/2002 Cholecystectomy - 03/27/2002 EGD: esophageal dilation (Dr. Madilyn Fireman); esophagus was diffusely dilated distally w/ abrupt taper into LES-?achalasia - 01/28/2004 Spirometry: mod obstr dz w/ mod improvement post-bronchodilator - 03/27/2004 Bypass grafting of RLE for PAD claudication - SFA patent with popliteal stent in place (12/28/2006) Cardiac Cath: normal CA 04/05/2007  Social History: Last updated: 03/09/2007 1/2 ppd x >20 years, down to 2/day in 2/05; Lives with husband, 3 grandchildren in house; Denies alcohol, illicit drugs Back to 1 ppd.  Brother passed away 02/15/07, sister had a stroke.  Stress with grandchildren. Married  Physical Exam  General:  Alert, sitting upright and active appearing Lungs:  normal wob.  end exp wheezing. Heart:  normal S1, S2, no m/r/g   Impression & Recommendations:  Problem # 1:  DIABETES MELLITUS, TYPE II (ICD-250.00) Assessment Unchanged  doing well.  continue meds. Her updated medication list for this problem includes:    Enalapril Maleate 20 Mg Tabs (Enalapril maleate) .Marland Kitchen... Take 1 tablet by mouth once a day    Metformin Hcl 500 Mg Tabs (Metformin hcl) .Marland Kitchen... 1 by mouth daily x 5 days, then 1 by mouth two times a day    Ecotrin Low Strength 81 Mg Tbec (Aspirin) .Marland Kitchen... Take one daily  Labs Reviewed: Creat: 1.03 (06/16/2009)    Reviewed HgBA1c results: 7.5 (06/02/2009)  6.4 (03/09/2007)  Orders: [facility]- Est  Level 4  (81191)  Problem # 2:  HYPERTENSION, BENIGN SYSTEMIC (ICD-401.1) Assessment: Deteriorated  elevated today.  continue meds. Her updated medication list for this problem includes:    Enalapril Maleate 20 Mg Tabs (Enalapril maleate) .Marland Kitchen... Take 1 tablet by mouth once a day    Coreg 6.25 Mg Tabs (Carvedilol) ..... One tab by mouth bid    Norvasc 10 Mg Tabs (Amlodipine besylate) .Marland Kitchen... 1 by mouth daily  BP today: 157/88 Prior BP: 124/79 (07/22/2009)  Prior 10 Yr Risk Heart Disease: Not enough information (03/09/2007)  Labs Reviewed: K+: 3.3 (07/11/2009) Creat: : 1.03 (06/16/2009)   Chol: 208 (07/22/2009)   HDL: 34 (07/22/2009)   LDL: 136 (07/22/2009)   TG: 191 (07/22/2009)  Orders: [facility]- Est  Level 4 (99214)  Problem # 3:  HYPERCHOLESTEROLEMIA (ICD-272.0)  max dose zocor.  still elevated LDL.  discussed diet modifications, decrease fried and fatty foods.  increase vegatables, fiber. Her updated medication list for this problem includes:    Zocor 80  Mg Tabs (Simvastatin) .Marland Kitchen... 1 by mouth nightly  Labs Reviewed: SGOT: 13 (06/16/2009)   SGPT: 10 (06/16/2009)  Prior 10 Yr Risk Heart Disease: Not enough information (03/09/2007)   HDL:34 (07/22/2009), 36 (06/12/2008)  LDL:136 (07/22/2009), 126 (06/12/2008)  Chol:208 (07/22/2009), 238 (06/12/2008)  Trig:191 (07/22/2009), 379 (06/12/2008)  Orders: [facility]- Est  Level 4 (29562)  Problem # 4:  TOBACCO DEPENDENCE (ICD-305.1)  had quit for 2 months, now back to smoking.  discussed ways to further uct back, set quit date, recruit partner to quit together. Her updated medication list for this problem includes:    Cvs Nicotine Polacrilex 2 Mg Gum (Nicotine polacrilex) .Marland Kitchen... 1 piece every 4 hours as needed for cigarette cravings disp: qs x one month  Orders: [facility]- Est  Level 4 (13086)  Problem # 5:  PERIPHERAL VASCULAR DISEASE (ICD-443.9) sounds like claudication.  on plavix.  hesitant to add pletal.  continue to monitor.  consider PT down  road. Her updated medication list for this problem includes:    Plavix 75 Mg Tabs (Clopidogrel bisulfate) .Marland Kitchen... 1 by mouth daily  Complete Medication List: 1)  Advair Diskus 500-50 Mcg/dose Misc (Fluticasone-salmeterol) .... Inhale 1 puff as directed twice a day 2)  Albuterol 90 Mcg/act Aers (Albuterol) .... Inhale 2 puff as directed four times a day 3)  Enalapril Maleate 20 Mg Tabs (Enalapril maleate) .... Take 1 tablet by mouth once a day 4)  Flonase 50 Mcg/act Susp (Fluticasone propionate) .... Spray 1 spray into both nostrils once a day 5)  Singulair 10 Mg Tabs (Montelukast sodium) .... Take 1 tablet by mouth at bedtime 6)  Coreg 6.25 Mg Tabs (Carvedilol) .... One tab by mouth bid 7)  Ultram 50 Mg Tabs (Tramadol hcl) .... 2 tabs three times a day as needed pain 8)  Flexeril 10 Mg Tabs (Cyclobenzaprine hcl) .... One three times a day prn 9)  Zocor 80 Mg Tabs (Simvastatin) .Marland Kitchen.. 1 by mouth nightly 10)  Plavix 75 Mg Tabs (Clopidogrel bisulfate) .Marland Kitchen.. 1 by mouth daily 11)  Norvasc 10 Mg Tabs (Amlodipine besylate) .Marland Kitchen.. 1 by mouth daily 12)  Cvs Nicotine Polacrilex 2 Mg Gum (Nicotine polacrilex) .Marland Kitchen.. 1 piece every 4 hours as needed for cigarette cravings disp: qs x one month 13)  Metformin Hcl 500 Mg Tabs (Metformin hcl) .Marland Kitchen.. 1 by mouth daily x 5 days, then 1 by mouth two times a day 14)  Onetouch Ultrasoft Lancets Misc (Lancets) .... Use as directed 15)  Onetouch Ultra Test Strp (Glucose blood) .... Use as directed, qs 1 month 16)  Ecotrin Low Strength 81 Mg Tbec (Aspirin) .... Take one daily  Lipid Assessment/Plan:      Based on NCEP/ATP III, the patient's risk factor category is "history of coronary disease, peripheral vascular disease, cerebrovascular disease, or aortic aneurysm along with either diabetes, current smoker, or LDL > 130 plus HDL < 40 plus triglycerides > 200".  The patient's lipid goals are as follows: Total cholesterol goal is 200; LDL cholesterol goal is 70; HDL cholesterol  goal is 40; Triglyceride goal is 150.  Her LDL cholesterol goal has not been met.  Secondary causes for hyperlipidemia have been ruled out.  She has been counseled on adjunctive measures for lowering her cholesterol and has been provided with dietary instructions.     Patient Instructions: 1)  Please return in 1 month for follow up of smoking and HTN/DM 2)  Complete the stool cards. 3)  Call for another eye doctor appointment. 4)  Continue all your medicines.  good job with them up to now! 5)  your leg pain sounds like claudication.  I will try and find records of your physical therapy prior to sending you back to them. 6)  Try the diet changes - more fiber and fruits/vegetables, less fatty foods. 7)  Call clinic with questions.  Prevention & Chronic Care Immunizations   Influenza vaccine: Fluvax MCR  (10/01/2008)   Influenza vaccine deferral: Deferred  (08/25/2009)   Influenza vaccine due: 10/01/2009    Tetanus booster: 09/26/2002: Done.   Tetanus booster due: 09/26/2012    Pneumococcal vaccine: Done.  (10/27/2001)   Pneumococcal vaccine due: None    H. zoster vaccine: Not documented  Colorectal Screening   Hemoccult: Done.  (02/24/1997)   Hemoccult action/deferral: Ordered  (07/22/2009)   Hemoccult due: 02/24/1998    Colonoscopy: Not documented   Colonoscopy action/deferral: Deferred  (07/22/2009)  Other Screening   Pap smear: normal  (10/01/2008)   Pap smear due: 09/2011    Mammogram: BI-RADS CATEGORY 4:  Suspicious abnormality - biopsy should be^MM DIGITAL DIAG LTD R  (10/22/2008)   Mammogram due: 01/27/2005    DXA bone density scan: Not documented   Smoking status: current  (08/25/2009)   Smoking cessation counseling: yes  (12/05/2008)  Diabetes Mellitus   HgbA1C: 7.5  (06/02/2009)   Hemoglobin A1C due: 06/09/2007    Eye exam: Not documented   Diabetic eye exam action/deferral: Ophthalmology referral  (07/22/2009)    Foot exam: yes  (07/22/2009)   Foot exam  action/deferral: Deferred   High risk foot: Not documented   Foot care education: Done  (07/22/2009)   Foot exam due: 11/25/2009    Urine microalbumin/creatinine ratio: Not documented    Diabetes flowsheet reviewed?: Yes   Progress toward A1C goal: Unchanged  Lipids   Total Cholesterol: 208  (07/22/2009)   LDL: 136  (07/22/2009)   LDL Direct: 67  (12/05/2008)   HDL: 34  (07/22/2009)   Triglycerides: 191  (07/22/2009)    SGOT (AST): 13  (06/16/2009)   SGPT (ALT): 10  (06/16/2009)   Alkaline phosphatase: 127  (07/11/2009)   Total bilirubin: 0.3  (06/16/2009)    Lipid flowsheet reviewed?: Yes   Progress toward LDL goal: Unchanged  Hypertension   Last Blood Pressure: 157 / 88  (08/25/2009)   Serum creatinine: 1.03  (06/16/2009)   Serum potassium 3.3  (07/11/2009)   Basic metabolic panel due: 09/16/2009    Hypertension flowsheet reviewed?: Yes   Progress toward BP goal: Unchanged  Self-Management Support :   Personal Goals (by the next clinic visit) :     Personal A1C goal: 7  (08/25/2009)     Personal blood pressure goal: 140/90  (08/25/2009)     Personal LDL goal: 100  (08/25/2009)    Patient will work on the following items until the next clinic visit to reach self-care goals:     Medications and monitoring: take my medicines every day, check my blood sugar, check my blood pressure, bring all of my medications to every visit  (08/25/2009)     Eating: eat more vegetables  (08/25/2009)    Diabetes self-management support: Education handout, Written self-care plan  (08/25/2009)   Diabetes care plan printed   Diabetes education handout printed    Hypertension self-management support: Education handout, Written self-care plan  (08/25/2009)   Hypertension self-care plan printed.   Hypertension education handout printed    Lipid self-management support: Education handout, Written self-care plan  (  08/25/2009)   Lipid self-care plan printed.   Lipid education handout  printed

## 2011-01-26 NOTE — Miscellaneous (Signed)
Summary: appt at [facility]  Clinical Lists Changes rec'd message that pt will see Dr. Myra Gianotti 06/08/10 at 1pm. pt is aware of the appt. for questions [phone].Golden Circle RN  May 18, 2010 10:52 AM

## 2011-01-26 NOTE — Assessment & Plan Note (Signed)
Summary: PFTs - Tobacco Cessation. - Rx   Vital Signs:  Patient profile:   65 year old female Height:      61 inches Weight:      149.3 pounds BMI:     28.31 Pulse rate:   56 / minute BP sitting:   156 / 82  (right arm)  Serial Vital Signs/Assessments:  Time      Position  BP       Pulse  Resp  Temp     By 11:50               138/82                         Madelon Lips Pharm D  Comments: 11:50 Left Arm Manual By: Christian Mate D    Primary Care Provider:  Clementeen Graham MD   History of Present Illness: Patient with 38-year history of smoking and 32-year history of "asthma" arrives to clinic today for evaluation of lung function and smoking cessation counseling.   Patient reports that she started smoking around age 63 and has previously quit 3 times for 9 months each during each of her pregnancies. Previously smoked up to 8 cigarettes per day but is currently only smoking 5-6 per day, with triggers including morning coffee and stress due to family living situations. Patient has nicotine gum at home and has used it sporadically in the past.   Patient also reports that she was diagnosed with asthma in her 2's and began taking asthma medications around the age of 31 years. Patient is using her asthmamedications as follows: Advair 1 puff once daily and possibly twice daily if symptoms are bad, albuterol 2 puffs at bedtime, and Qvar 2 puffs at bedtime. Patient complains of 1-2 nighttime awakenings with dyspnea per week as well as dyspnea upon exertion and at rest.    Current Medications (verified): 1)  Plavix 75 Mg  Tabs (Clopidogrel Bisulfate) .Marland Kitchen.. 1 By Mouth Daily 2)  Norvasc 10 Mg Tabs (Amlodipine Besylate) .Marland Kitchen.. 1 By Mouth Daily 3)  Zocor 20 Mg Tabs (Simvastatin) .Marland Kitchen.. 1 By Mouth Qhs 4)  Coreg 6.25 Mg  Tabs (Carvedilol) .... One Tab By Mouth Bid 5)  Enalapril Maleate 20 Mg Tabs (Enalapril Maleate) .... Take 1 Tablet By Mouth Once A Day 6)  Metformin Hcl 500 Mg Tabs (Metformin  Hcl) .Marland Kitchen.. 1 By Mouth Two Times A Day 7)  Advair Diskus 500-50 Mcg/dose Misc (Fluticasone-Salmeterol) .... Inhale 1 Puff Twice Daily 8)  Singulair 10 Mg Tabs (Montelukast Sodium) .... Take 1 Tablet By Mouth At Bedtime 9)  Flonase 50 Mcg/act Susp (Fluticasone Propionate) .... Spray 1 Spray Into Both Nostrils Once A Day As Needed 10)  Ultram 50 Mg  Tabs (Tramadol Hcl) .... 2 Tabs Twice Daily For Pain As Needed 11)  Flexeril 10 Mg Tabs (Cyclobenzaprine Hcl) .... One By Mouth Two Times A Day As Needed Muscle Spasm 12)  Onetouch Ultrasoft Lancets  Misc (Lancets) .... Use As Directed 13)  Onetouch Ultra Test  Strp (Glucose Blood) .... Use As Directed, Qs 1 Month 14)  Cilostazol 50 Mg Tabs (Cilostazol) .... Dose Per Vasc Surg 15)  Proair Hfa 108 (90 Base) Mcg/act Aers (Albuterol Sulfate) .... 2 Puffs At Bedtime and As Needed Shortness of Breath. 16)  Qvar 40 Mcg/act Aers (Beclomethasone Dipropionate) .... 2 Puffs At Bedtime  Allergies (verified): 1)  ! Aspirin  Social History: 1 pack every week.  h/o quitting in past.  Lives with husband, 3 grandchildren in house; Denies alcohol, illicit drugs Brother passed away 03-Mar-2007, sister had a stroke.  Stress with grandchildren. 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  History of work in [employer] where she was exposed to cotton fiber for  ~20 years.  Pt has been experiencing dyspnea since age 66 and was diagnosed with asthma at that time.  Reviewed results of pulmonary function tests.  Pt verbalized understanding of results.  Pt willing to increase Advair to two times a day dosing on routine schedule.  NOT sure if QVAR is adding anything but did continue this today.     Written pt instructions  provided.   TTFFC:   55  mins.  Patient seen with:  Harrel Carina, PharmD Candidate    The following medications were removed from the medication list:    Albuterol 90 Mcg/act Aers (Albuterol) ..... Inhale 2 puff as directed four times a day Her updated medication list for this problem includes:    Advair Diskus 500-50 Mcg/dose Misc (Fluticasone-salmeterol) ..... Inhale 1 puff twice daily    Singulair 10 Mg Tabs (Montelukast sodium) .Marland Kitchen... Take 1 tablet by mouth at bedtime    Proair Hfa 108 (90 Base) Mcg/act Aers (Albuterol sulfate) .Marland Kitchen... 2 puffs at bedtime and as needed shortness of breath.    Qvar 40 Mcg/act Aers (Beclomethasone dipropionate) .Marland Kitchen... 2 puffs at bedtime  Orders: Albuterol Sulfate Sol 1mg  unit dose (W1191) Nebulizer- [facility] (94640) PFT Baseline-Pre/Post Bronchodiolator (PFT Baseline-Pre/Pos)  Problem # 2:  TOBACCO DEPENDENCE (ICD-305.1) Assessment: Unchanged  Mild  Nicotine Abuse of:  38 years duration in a patient who is:  good  candidate for success b/c of: current level of motivation and results of PFT testing which appeared to motivate a quit attempt in the immediate future.  Patient will restart  nicotine replacement tx:  gum  3-5 pieces per day on quit date.  Patient counseled on purpose, proper use, and potential adverse effects, including: nausea.    Written information provided:    F/U Rx Clinic Visit: as needed.  Next visit with Dr. Denyse Amass.          The following medications were removed from the medication list:    [facility] Nicotine Polacrilex 2 Mg Gum (Nicotine polacrilex) .Marland Kitchen... 1 piece every 4 hours as needed for cigarette cravings disp: qs x one month  Orders: PFT Baseline-Pre/Post Bronchodiolator (PFT Baseline-Pre/Pos)  Complete Medication List: 1)  Plavix 75 Mg Tabs (Clopidogrel bisulfate) .Marland Kitchen.. 1 by mouth daily 2)  Norvasc 10 Mg Tabs (Amlodipine besylate) .Marland Kitchen.. 1 by mouth daily 3)  Zocor 20 Mg Tabs (Simvastatin) .Marland Kitchen.. 1 by mouth qhs 4)  Coreg 6.25 Mg Tabs  (Carvedilol) .... One tab by mouth bid 5)  Enalapril Maleate 20 Mg Tabs (Enalapril maleate) .... Take 1 tablet by mouth once a day 6)  Metformin Hcl 500 Mg Tabs (Metformin hcl) .Marland Kitchen.. 1 by mouth two times a day 7)  Advair Diskus 500-50 Mcg/dose Misc (Fluticasone-salmeterol) .... Inhale 1 puff twice daily 8)  Singulair 10 Mg Tabs (Montelukast sodium) .... Take 1 tablet by mouth at bedtime 9)  Flonase 50 Mcg/act Susp (Fluticasone propionate) .... Spray 1 spray into both nostrils once a  day as needed 10)  Ultram 50 Mg Tabs (Tramadol hcl) .... 2 tabs twice daily for pain as needed 11)  Flexeril 10 Mg Tabs (Cyclobenzaprine hcl) .... One by mouth two times a day as needed muscle spasm 12)  Onetouch Ultrasoft Lancets Misc (Lancets) .... Use as directed 13)  Onetouch Ultra Test Strp (Glucose blood) .... Use as directed, qs 1 month 14)  Cilostazol 50 Mg Tabs (Cilostazol) .... Dose per vasc surg 15)  Proair Hfa 108 (90 Base) Mcg/act Aers (Albuterol sulfate) .... 2 puffs at bedtime and as needed shortness of breath. 16)  Qvar 40 Mcg/act Aers (Beclomethasone dipropionate) .... 2 puffs at bedtime  Patient Instructions: 1)  Quit Smoking SOON. 2)  Use 3-5 pieces of nicotine gum daily when you quit.  3)  Next visit with Dr. Denyse Amass. 4)  Reschedule with pharmacy clinic in November if you need more help.  5)  Good luck quitting.  The lung function test today showed moderately severe restriction and the most importan thing you can do is QUIT Smoking.  Prescriptions: QVAR 40 MCG/ACT AERS (BECLOMETHASONE DIPROPIONATE) 2 puffs at bedtime  #1 x 0   Entered and Authorized by:   Christian Mate D   Signed by:   Madelon Lips Pharm D on 10/13/2010   Method used:   Historical   RxID:   [id number] ADVAIR DISKUS 500-50 MCG/DOSE MISC (FLUTICASONE-SALMETEROL) Inhale 1 puff twice daily  #1 x 0   Entered and Authorized by:   Christian Mate D   Signed by:   Madelon Lips Pharm D on 10/13/2010   Method used:    Historical   RxID:   [id number] FLONASE 50 MCG/ACT SUSP (FLUTICASONE PROPIONATE) Spray 1 spray into both nostrils once a day as needed  #1 x 0   Entered and Authorized by:   Christian Mate D   Signed by:   Madelon Lips Pharm D on 10/13/2010   Method used:   Historical   RxID:   [id number] PROAIR HFA 108 (90 BASE) MCG/ACT AERS (ALBUTEROL SULFATE) 2 puffs at bedtime and as needed shortness of breath.  #1 x 0   Entered and Authorized by:   Christian Mate D   Signed by:   Madelon Lips Pharm D on 10/13/2010   Method used:   Historical   RxID:   [id number]    Medication Administration  Medication # 1:    Medication: Albuterol Sulfate Sol 1mg  unit dose    Diagnosis: COPD (ICD-496)    Dose: 2.5mg     Route: inhaled    Exp Date: 02/25/2012    Lot #: [id number]    Mfr: nephron    Patient tolerated medication without complications    Given by: Jone Baseman CMA (October 13, 2010 11:16 AM)  Orders Added: 1)  Albuterol Sulfate Sol 1mg  unit dose [J7613] 2)  Nebulizer- [facility] [33295] 3)  PFT Baseline-Pre/Post Bronchodiolator [PFT Baseline-Pre/Pos]     Medication Administration  Medication # 1:    Medication: Albuterol Sulfate Sol 1mg  unit dose    Diagnosis: COPD (ICD-496)    Dose: 2.5mg     Route: inhaled    Exp Date: 02/25/2012    Lot #: [id number]    Mfr: nephron    Patient tolerated medication without complications    Given by: Jone Baseman CMA (October 13, 2010 11:16 AM)  Orders Added: 1)  Albuterol Sulfate Sol 1mg  unit dose [J7613] 2)  Nebulizer- [facility] [60630] 3)  PFT Baseline-Pre/Post Bronchodiolator [PFT Baseline-Pre/Pos]  Prevention & Chronic Care Immunizations   Influenza vaccine: Fluvax Non-MCR  (09/24/2010)   Influenza vaccine deferral: Deferred  (08/25/2009)   Influenza vaccine due: 08/27/2010    Tetanus booster: 09/26/2002: Done.   Tetanus booster due: 09/26/2012    Pneumococcal vaccine: Done.  (10/27/2001)   Pneumococcal vaccine due:  10/27/2006    H. zoster vaccine: Not documented  Colorectal Screening   Hemoccult: Done.  (02/24/1997)   Hemoccult action/deferral: Ordered  (09/25/2009)   Hemoccult due: 02/24/1998    Colonoscopy: Not documented   Colonoscopy action/deferral: Deferred  (07/22/2009)  Other Screening   Pap smear: normal  (10/01/2008)   Pap smear action/deferral: Deferred-3 yr interval  (04/13/2010)   Pap smear due: 10/02/2011    Mammogram: BI-RADS CATEGORY 4:  Suspicious abnormality - biopsy should be^MM DIGITAL DIAG LTD R  (10/22/2008)   Mammogram action/deferral: Deferred  (08/20/2010)   Mammogram due: 10/22/2009    DXA bone density scan: Not documented   Smoking status: current  (09/24/2010)   Smoking cessation counseling: YES  (09/24/2010)   Target quit date: 05/27/2010  (04/21/2010)  Diabetes Mellitus   HgbA1C: 6.6  (08/20/2010)   Hemoglobin A1C due: 06/09/2007    Eye exam: Not documented   Diabetic eye exam action/deferral: Ophthalmology referral  (07/22/2009)    Foot exam: yes  (07/22/2009)   Foot exam action/deferral: Deferred   High risk foot: Not documented   Foot care education: Done  (07/22/2009)   Foot exam due: 11/25/2009    Urine microalbumin/creatinine ratio: Not documented    Diabetes flowsheet reviewed?: Yes   Progress toward A1C goal: At goal  Lipids   Total Cholesterol: 208  (07/22/2009)   LDL: 136  (07/22/2009)   LDL Direct: 62  (09/24/2010)   HDL: 34  (07/22/2009)   Triglycerides: 191  (07/22/2009)    SGOT (AST): 19  (03/11/2010)   SGPT (ALT): 21  (03/11/2010)   Alkaline phosphatase: 116  (03/11/2010)   Total bilirubin: 0.3  (03/11/2010)    Lipid flowsheet reviewed?: Yes   Progress toward LDL goal: At goal  Hypertension   Last Blood Pressure: 156 / 82  (10/13/2010)   Serum creatinine: 0.87  (09/24/2010)   Serum potassium 3.9  (09/24/2010)   Basic metabolic panel due: 09/16/2009    Hypertension flowsheet reviewed?: Yes   Progress toward BP goal:  Deteriorated  Self-Management Support :   Personal Goals (by the next clinic visit) :     Personal A1C goal: 8  (12/10/2009)     Personal blood pressure goal: 140/90  (08/25/2009)     Personal LDL goal: 100  (08/25/2009)    Diabetes self-management support: Written self-care plan  (05/15/2010)    Diabetes self-management support not done because: Good outcomes  (04/13/2010)    Hypertension self-management support: Written self-care plan  (05/15/2010)    Hypertension self-management support not done because: Good outcomes  (04/13/2010)    Lipid self-management support: Written self-care plan  (05/15/2010)     Lipid self-management support not done because: Good outcomes  (04/21/2010)    Pulmonary Function Test Date: 10/06/2010 Height (in.): 61 Gender: Female  Pre-Spirometry FVC    Value: 1.28 L/min   Pred: 2.23 L/min     % Pred: 57 % FEV1    Value: 0.96 L     Pred: 1.81 L     % Pred: 52 % FEV1/FVC  Value: 75 %     Pred: 83 %     % Pred: 90 % FEF 25-75  Value: 0.72 L/min   Pred: 1.95 L/min     % Pred: 36 %  Post-Spirometry FVC    Value: 1.45 L/min   Pred: 2.23 L/min     % Pred: 64 % FEV1    Value: 0.98 L     Pred: 1.81 L     % Pred: 53 % FEV1/FVC  Value: 67 %     Pred: 83 %     % Pred: 81 % FEF 25-75  Value: 0.50 L/min   Pred: 1.95 L/min     % Pred: 25 %  Comments: Good to excellent effort. Moderately severe restrictive disease.
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
## 2011-01-26 NOTE — Letter (Signed)
Summary: *Referral Letter  [facility]  [address]   [place], [place] [address]   Phone: [phone]  Fax: [phone]    10/13/2007  Thank you in advance for agreeing to see my patient: [name] [name] [address] [place], [place]  [address]  Phone: [phone] Reason for Referral: Ms. Tauer is a 65 year old African American female with HTN, PAD (recent ABI 0.67 on right, 0.83 on left), hypercholesterolemia, glucose intolerance, and tobacco abuse.  She was recently diagnosed with PAD after presenting with claudication.  She had a negative cardiac workup in 2003, but in light of her new symptoms and diagnosis of PAD, I would like her to be reevalutated for coronary artery disease.   Current Medical Problems: 1)  PERIPHERAL VASCULAR DISEASE (ICD-443.9) 2)  HIP PAIN, RIGHT (ICD-719.45) 3)  CHEST PAIN (ICD-786.50) 4)  TOBACCO DEPENDENCE (ICD-305.1) 5)  PARESTHESIA NOS (ICD-782.0) 6)  HYPERTENSION, BENIGN SYSTEMIC (ICD-401.1) 7)  HYPERCHOLESTEROLEMIA (ICD-272.0) 8)  HIP PAINFUL, ARTHRALGIA (ICD-719.45) 9)  HEADACHE, UNSPECIFIED (ICD-784.0) 10)  GLUCOSE INTOLERANCE (ICD-271.3) 11)  GASTROESOPHAGEAL REFLUX, NO ESOPHAGITIS (ICD-530.81) 12)  BACK PAIN, LOW (ICD-724.2) 13)  ASTHMA, UNSPECIFIED (ICD-493.90) 14)  ANXIETY (ICD-300.00)  Current Medications: 1)  ADVAIR DISKUS 500-50 MCG/DOSE MISC (FLUTICASONE-SALMETEROL) Inhale 1 puff as directed twice a day 2)  ALBUTEROL 90 MCG/ACT AERS (ALBUTEROL) Inhale 2 puff as directed four times a day 3)  ENALAPRIL MALEATE 20 MG TABS (ENALAPRIL MALEATE) Take 1 tablet by mouth once a day 4)  FLONASE 50 MCG/ACT SUSP (FLUTICASONE PROPIONATE) Spray 1 spray into both nostrils once a day 5)  HYDROCHLOROTHIAZIDE 25 MG TABS (HYDROCHLOROTHIAZIDE) Take 1 tablet by mouth every morning 6)  SINGULAIR 10 MG TABS (MONTELUKAST SODIUM) Take 1 tablet by mouth at bedtime 7)  COREG 6.25 MG  TABS (CARVEDILOL) One tab by mouth BID 8)  ULTRAM  50 MG  TABS (TRAMADOL HCL) One to two tabs by mouth q 6 hrs as needed pain 9)  FLEXERIL 10 MG TABS (CYCLOBENZAPRINE HCL) One tab by mouth three times a day as needed (do not drive or operate machinary when taking this med.) 10)  PRAVACHOL 40 MG  TABS (PRAVASTATIN SODIUM) 1 by mouth daily  Thank you again for agreeing to see our patient; please contact us if you have any further questions or need additional information.  Sincerely,  Kord Monette Luz Brazen MD

## 2011-01-26 NOTE — Assessment & Plan Note (Signed)
Summary: f/u DM, HTN   Vital Signs:  Patient profile:   65 year old female Height:      61.5 inches Weight:      146.56 pounds BMI:     27.34 Temp:     97.2 degrees F oral Pulse rate:   60 / minute Pulse rhythm:   regular BP sitting:   131 / 72  (right arm) CC: Follow up visit for DM. Is Patient Diabetic? Yes  Pain Assessment Patient in pain? no        Primary Care Provider:  Eustaquio Boyden  MD  CC:  Follow up visit for DM.Marland Kitchen  History of Present Illness: CC: f/u DM  1. DM - States fasting cbgs have been low 100s.  Forgot log today.  No low measures or hypoglycemic sxs.  doing well with meds.    2. HTN - compliant with meds.  tolerant of meds, no CP, tightness, SOB.  3. HLD - compliant with Zocor.  4. CAD - nonobstructive CAD by last cath, but pt on plavix for popliteal stent for PAD.  No endorsing sxs of claudication.  states pain starts with walking about 1-2 blocks.  has to stop and rest.  5. COPD - compliant with alb, singulair and advair.  6. tobacco - still smoking 1 p every 3 days.  interested in quitting because notes improvement inbreathing with quitting and daughter and grandchildren were very proud with patient when she quit.  7. CLL - h/o CLL found incidentally after breast mass.    8. preventative med - needs colonoscopy, prefers to do stool cards.  again forgot to bring back, provided with another set of cards.  9. HAs - h/o meninigioma s/p resection 1998, states has HA once or twice a week, relieved with tylenol.  not progressive or worsening.  Lots of deaths in church family recently.  to receive flu shot today.  Habits & Providers  Alcohol-Tobacco-Diet     Tobacco Status: current     Cigarette Packs/Day: 0.25  Current Medications (verified): 1)  Advair Diskus 500-50 Mcg/dose Misc (Fluticasone-Salmeterol) .... Inhale 1 Puff As Directed Twice A Day 2)  Albuterol 90 Mcg/act Aers (Albuterol) .... Inhale 2 Puff As Directed Four Times A Day 3)   Enalapril Maleate 20 Mg Tabs (Enalapril Maleate) .... Take 1 Tablet By Mouth Once A Day 4)  Flonase 50 Mcg/act Susp (Fluticasone Propionate) .... Spray 1 Spray Into Both Nostrils Once A Day 5)  Singulair 10 Mg Tabs (Montelukast Sodium) .... Take 1 Tablet By Mouth At Bedtime 6)  Coreg 6.25 Mg  Tabs (Carvedilol) .... One Tab By Mouth Bid 7)  Ultram 50 Mg  Tabs (Tramadol Hcl) .... 2 Tabs Three Times A Day As Needed Pain 8)  Flexeril 10 Mg Tabs (Cyclobenzaprine Hcl) .... One Three Times A Day Prn 9)  Zocor 80 Mg  Tabs (Simvastatin) .Marland Kitchen.. 1 By Mouth Nightly 10)  Plavix 75 Mg  Tabs (Clopidogrel Bisulfate) .Marland Kitchen.. 1 By Mouth Daily 11)  Norvasc 10 Mg Tabs (Amlodipine Besylate) .Marland Kitchen.. 1 By Mouth Daily 12)  Cvs Nicotine Polacrilex 2 Mg Gum (Nicotine Polacrilex) .Marland Kitchen.. 1 Piece Every 4 Hours As Needed For Cigarette Cravings Disp: Qs X One Month 13)  Metformin Hcl 500 Mg Tabs (Metformin Hcl) .Marland Kitchen.. 1 By Mouth Daily X 5 Days, Then 1 By Mouth Two Times A Day 14)  Onetouch Ultrasoft Lancets  Misc (Lancets) .... Use As Directed 15)  Onetouch Ultra Test  Strp (Glucose Blood) .... Use  As Directed, Qs 1 Month 16)  Ecotrin Low Strength 81 Mg Tbec (Aspirin) .... Take One Daily  Allergies: 1)  ! Aspirin  Past History:  Past medical, surgical, family and social histories (including risk factors) reviewed, and no changes noted (except as noted below).  Past Medical History: Reviewed history from 07/02/2009 and no changes required.  brain tumor - meningioma removed 1998-[facility], H/o dysphagia--resolved with esoph dilation  R CVA s/p meningioma removal Current Problems:  DIABETES MELLITUS, TYPE II (ICD-250.00) --> [facility] LEUKEMIA, LYMPHOCYTIC, CHRONIC (ICD-204.10) LOW BACK PAIN SYNDROME, SEVERE (ICD-724.2) ADJUSTMENT DISORDER WITH DEPRESSED MOOD (ICD-309.0) ROUTINE GYNECOLOGICAL EXAMINATION (ICD-V72.31) SPECIAL SCREENING FOR MALIGNANT NEOPLASMS COLON  (ICD-V76.51) SCREENING FOR MALIGNANT NEOPLASM OF THE CERVIX (ICD-V76.2) PERIPHERAL VASCULAR DISEASE (ICD-443.9) TOBACCO DEPENDENCE (ICD-305.1) HYPERTENSION, BENIGN SYSTEMIC (ICD-401.1) HYPERCHOLESTEROLEMIA (ICD-272.0) GASTROESOPHAGEAL REFLUX, NO ESOPHAGITIS (ICD-530.81) COPD (ICD-496) ANXIETY (ICD-300.00)  Past Surgical History: Reviewed history from 07/22/2009 and no changes required. Cardiac Cath: normal coronary arteries 02/24/2002 Cholecystectomy - 03/27/2002 EGD: esophageal dilation (Dr. Madilyn Fireman); esophagus was diffusely dilated distally w/ abrupt taper into LES-?achalasia - 01/28/2004 Spirometry: mod obstr dz w/ mod improvement post-bronchodilator - 03/27/2004 Bypass grafting of RLE for PAD claudication - SFA patent with popliteal stent in place (12/28/2006) Cardiac Cath: normal CA 04/05/2007  Family History: Reviewed history from 02/23/2007 and no changes required. `diabetes in several family members`, Brother--IDDM, Father--unknown (was not involved with her), Mother--deceased 2004-05-22; CAD, Unsure about other family history  Social History: Reviewed history from 03/09/2007 and no changes required. 1 pack every 3 days.  h/o quitting in past.  Lives with husband, 3 grandchildren in house; Denies alcohol, illicit drugs Brother passed away Feb 22, 2007, sister had a stroke.  Stress with grandchildren. MarriedPacks/Day:  0.25  Physical Exam  General:  Alert, sitting upright and active appearing Lungs:  Normal respiratory effort, chest expands symmetrically. Lungs are clear to auscultation, no crackles or wheezes. Heart:  normal S1, S2, no m/r/g   Impression & Recommendations:  Problem # 1:  DIABETES MELLITUS, TYPE II (ICD-250.00) Assessment Unchanged to bring meter next visit.  continue meds.  send back to diabetes center. Her updated medication list for this problem includes:    Enalapril Maleate 20 Mg Tabs (Enalapril maleate) .Marland Kitchen... Take 1 tablet by mouth once a day    Metformin Hcl 500 Mg  Tabs (Metformin hcl) .Marland Kitchen... 1 by mouth daily x 5 days, then 1 by mouth two times a day    Ecotrin Low Strength 81 Mg Tbec (Aspirin) .Marland Kitchen... Take one daily  Orders: A1C-[facility] (16109) [facility]- Est  Level 4 (60454)  Labs Reviewed: Creat: 1.03 (06/16/2009)    Reviewed HgBA1c results: 7.5 (06/02/2009)  6.4 (03/09/2007)  Problem # 2:  TOBACCO DEPENDENCE (ICD-305.1)  provided  with group soking cessation class information.  Pt very interested if it's free. Her updated medication list for this problem includes:    Cvs Nicotine Polacrilex 2 Mg Gum (Nicotine polacrilex) .Marland Kitchen... 1 piece every 4 hours as needed for cigarette cravings disp: qs x one month  Orders: [facility]- Est  Level 4 (09811)  Problem # 3:  HYPERTENSION, BENIGN SYSTEMIC (ICD-401.1)  Her updated medication list for this problem includes:    Enalapril Maleate 20 Mg Tabs (Enalapril maleate) .Marland Kitchen... Take 1 tablet by mouth once a day    Coreg 6.25 Mg Tabs (Carvedilol) ..... One tab by mouth bid    Norvasc 10 Mg Tabs (Amlodipine besylate) .Marland Kitchen... 1 by mouth daily  BP today: 131/72  Prior BP: 157/88 (08/25/2009)  Prior 10 Yr Risk Heart Disease: > 32 % (08/25/2009)  Labs Reviewed: K+: 3.3 (07/11/2009) Creat: : 1.03 (06/16/2009)   Chol: 208 (07/22/2009)   HDL: 34 (07/22/2009)   LDL: 136 (07/22/2009)   TG: 191 (07/22/2009)  Orders: [facility]- Est  Level 4 (99214)  Problem # 4:  HYPERCHOLESTEROLEMIA (ICD-272.0)  Her updated medication list for this problem includes:    Zocor 80 Mg Tabs (Simvastatin) .Marland Kitchen... 1 by mouth nightly  Labs Reviewed: SGOT: 13 (06/16/2009)   SGPT: 10 (06/16/2009)  Lipid Goals: Chol Goal: 200 (08/25/2009)   HDL Goal: 40 (08/25/2009)   LDL Goal: 70 (08/25/2009)   TG Goal: 150 (08/25/2009)  Prior 10 Yr Risk Heart Disease: > 32 % (08/25/2009)   HDL:34 (07/22/2009), 36 (06/12/2008)  LDL:136 (07/22/2009), 126 (06/12/2008)  Chol:208 (07/22/2009), 238 (06/12/2008)  Trig:191 (07/22/2009), 379 (06/12/2008)  Orders: [facility]- Est  Level  4 (99214)  Complete Medication List: 1)  Advair Diskus 500-50 Mcg/dose Misc (Fluticasone-salmeterol) .... Inhale 1 puff as directed twice a day 2)  Albuterol 90 Mcg/act Aers (Albuterol) .... Inhale 2 puff as directed four times a day 3)  Enalapril Maleate 20 Mg Tabs (Enalapril maleate) .... Take 1 tablet by mouth once a day 4)  Flonase 50 Mcg/act Susp (Fluticasone propionate) .... Spray 1 spray into both nostrils once a day 5)  Singulair 10 Mg Tabs (Montelukast sodium) .... Take 1 tablet by mouth at bedtime 6)  Coreg 6.25 Mg Tabs (Carvedilol) .... One tab by mouth bid 7)  Ultram 50 Mg Tabs (Tramadol hcl) .... 2 tabs three times a day as needed pain 8)  Flexeril 10 Mg Tabs (Cyclobenzaprine hcl) .... One three times a day prn 9)  Zocor 80 Mg Tabs (Simvastatin) .Marland Kitchen.. 1 by mouth nightly 10)  Plavix 75 Mg Tabs (Clopidogrel bisulfate) .Marland Kitchen.. 1 by mouth daily 11)  Norvasc 10 Mg Tabs (Amlodipine besylate) .Marland Kitchen.. 1 by mouth daily 12)  Cvs Nicotine Polacrilex 2 Mg Gum (Nicotine polacrilex) .Marland Kitchen.. 1 piece every 4 hours as needed for cigarette cravings disp: qs x one month 13)  Metformin Hcl 500 Mg Tabs (Metformin hcl) .Marland Kitchen.. 1 by mouth daily x 5 days, then 1 by mouth two times a day 14)  Onetouch Ultrasoft Lancets Misc (Lancets) .... Use as directed 15)  Onetouch Ultra Test Strp (Glucose blood) .... Use as directed, qs 1 month 16)  Ecotrin Low Strength 81 Mg Tbec (Aspirin) .... Take one daily  Other Orders: Hemoccult Cards (Take Home) (Hemoccult Cards)  Patient Instructions: 1)  Please return in 1 month if you'd like to further talk about smoking.  Please return in 3 months for follow up of diabetes/hypertension. 2)  Complete the stool cards. 3)  Maggine May at [facility] number is [phone].  GIve her a call!  I will fax her your A1c results. 4)  Call for another eye doctor appointment. 5)  Continue all your medicines.  good job with them up to now! 6)  Call clinic with  questions.   Prevention & Chronic Care Immunizations   Influenza vaccine: Fluvax MCR  (10/01/2008)   Influenza vaccine deferral: Deferred  (08/25/2009)   Influenza vaccine due: 10/01/2009    Tetanus booster: 09/26/2002: Done.   Tetanus booster due: 09/26/2012    Pneumococcal vaccine: Done.  (10/27/2001)   Pneumococcal vaccine due: None    H. zoster vaccine: Not documented  Colorectal Screening   Hemoccult: Done.  (02/24/1997)   Hemoccult action/deferral: Ordered  (  09/25/2009)   Hemoccult due: 02/24/1998    Colonoscopy: Not documented   Colonoscopy action/deferral: Deferred  (07/22/2009)  Other Screening   Pap smear: normal  (10/01/2008)   Pap smear due: 09/2011    Mammogram: BI-RADS CATEGORY 4:  Suspicious abnormality - biopsy should be^MM DIGITAL DIAG LTD R  (10/22/2008)   Mammogram due: 01/27/2005    DXA bone density scan: Not documented   Smoking status: current  (09/25/2009)   Smoking cessation counseling: yes  (12/05/2008)  Diabetes Mellitus   HgbA1C: 7.5  (06/02/2009)   Hemoglobin A1C due: 06/09/2007    Eye exam: Not documented   Diabetic eye exam action/deferral: Ophthalmology referral  (07/22/2009)    Foot exam: yes  (07/22/2009)   Foot exam action/deferral: Deferred   High risk foot: Not documented   Foot care education: Done  (07/22/2009)   Foot exam due: 11/25/2009    Urine microalbumin/creatinine ratio: Not documented    Diabetes flowsheet reviewed?: Yes   Progress toward A1C goal: Improved  Lipids   Total Cholesterol: 208  (07/22/2009)   LDL: 136  (07/22/2009)   LDL Direct: 67  (12/05/2008)   HDL: 34  (07/22/2009)   Triglycerides: 191  (07/22/2009)    SGOT (AST): 13  (06/16/2009)   SGPT (ALT): 10  (06/16/2009)   Alkaline phosphatase: 127  (07/11/2009)   Total bilirubin: 0.3  (06/16/2009)    Lipid flowsheet reviewed?: Yes   Progress toward LDL goal: Unchanged  Hypertension   Last Blood Pressure: 131 / 72  (09/25/2009)   Serum  creatinine: 1.03  (06/16/2009)   Serum potassium 3.3  (07/11/2009)   Basic metabolic panel due: 09/16/2009    Hypertension flowsheet reviewed?: Yes   Progress toward BP goal: Improved  Self-Management Support :   Personal Goals (by the next clinic visit) :     Personal A1C goal: 7  (08/25/2009)     Personal blood pressure goal: 140/90  (08/25/2009)     Personal LDL goal: 100  (08/25/2009)    Patient will work on the following items until the next clinic visit to reach self-care goals:     Medications and monitoring: take my medicines every day, check my blood sugar, check my blood pressure, bring all of my medications to every visit  (09/25/2009)     Eating: eat more vegetables  (08/25/2009)    Diabetes self-management support: Copy of home glucose meter record, CBG self-monitoring log, Written self-care plan, Referred for self-management class  (09/25/2009)   Diabetes care plan printed    Hypertension self-management support: Written self-care plan  (09/25/2009)   Hypertension self-care plan printed.    Hypertension self-management support not done because: Good outcomes  (09/25/2009)    Lipid self-management support: Lipid monitoring log, Written self-care plan, Referred for self-management class  (09/25/2009)   Lipid self-care plan printed.   Nursing Instructions: Provide Hemoccult cards with instructions (see order) Give Flu vaccine today    Appended Document: A1c  6.8 %    Lab Visit  Laboratory Results   Blood Tests   Date/Time Received: September 25, 2009 9:49 AM  Date/Time Reported: September 25, 2009 10:31 AM   HGBA1C: 6.8%   (Normal Range: Non-Diabetic - 3-6%   Control Diabetic - 6-8%)  Comments: ...............test performed by......Marland KitchenBonnie A. [place], [place] (ASCP)    Orders Today:   Appended Document: flu shot     Vitals Entered By: Arlyss Repress CMA, (September 25, 2009 11:45 AM)  Allergies: 1)  ! Aspirin   Complete  Medication List: 1)   Advair Diskus 500-50 Mcg/dose Misc (Fluticasone-salmeterol) .... Inhale 1 puff as directed twice a day 2)  Albuterol 90 Mcg/act Aers (Albuterol) .... Inhale 2 puff as directed four times a day 3)  Enalapril Maleate 20 Mg Tabs (Enalapril maleate) .... Take 1 tablet by mouth once a day 4)  Flonase 50 Mcg/act Susp (Fluticasone propionate) .... Spray 1 spray into both nostrils once a day 5)  Singulair 10 Mg Tabs (Montelukast sodium) .... Take 1 tablet by mouth at bedtime 6)  Coreg 6.25 Mg Tabs (Carvedilol) .... One tab by mouth bid 7)  Ultram 50 Mg Tabs (Tramadol hcl) .... 2 tabs three times a day as needed pain 8)  Flexeril 10 Mg Tabs (Cyclobenzaprine hcl) .... One three times a day prn 9)  Zocor 80 Mg Tabs (Simvastatin) .Marland Kitchen.. 1 by mouth nightly 10)  Plavix 75 Mg Tabs (Clopidogrel bisulfate) .Marland Kitchen.. 1 by mouth daily 11)  Norvasc 10 Mg Tabs (Amlodipine besylate) .Marland Kitchen.. 1 by mouth daily 12)  Cvs Nicotine Polacrilex 2 Mg Gum (Nicotine polacrilex) .Marland Kitchen.. 1 piece every 4 hours as needed for cigarette cravings disp: qs x one month 13)  Metformin Hcl 500 Mg Tabs (Metformin hcl) .Marland Kitchen.. 1 by mouth daily x 5 days, then 1 by mouth two times a day 14)  Onetouch Ultrasoft Lancets Misc (Lancets) .... Use as directed 15)  Onetouch Ultra Test Strp (Glucose blood) .... Use as directed, qs 1 month 16)  Ecotrin Low Strength 81 Mg Tbec (Aspirin) .... Take one daily  Other Orders: Influenza Vaccine NON MCR (16109)    Influenza Vaccine    Vaccine Type: Fluvax Non-MCR    Site: left deltoid    Mfr: Sanofi Pasteur    Dose: 0.5 ml    Route: IM    Given by: Arlyss Repress CMA,    Exp. Date: 06/25/2010    Lot #: [id number]    VIS given: 07/20/07 version given September 25, 2009.  Flu Vaccine Consent Questions    Do you have a history of severe allergic reactions to this vaccine? no    Any prior history of allergic reactions to egg and/or gelatin? no    Do you have a sensitivity to the preservative Thimersol? no    Do you  have a past history of Guillan-Barre Syndrome? no    Do you currently have an acute febrile illness? no    Have you ever had a severe reaction to latex? no    Vaccine information given and explained to patient? yes    Are you currently pregnant? no

## 2011-01-26 NOTE — Assessment & Plan Note (Signed)
Summary: DNKA               

## 2011-01-26 NOTE — Progress Notes (Signed)
Summary: PT ORDER  Phone Note Call from Patient   Caller: Patient Summary of Call: [facility] AND PT NEEDING NEW ORDER FAXED OVER BEFORE PT APPT ON 6/15 PT WILL NOT BE ALLOWED TO COME TO APPT WITHOUT ORDER Initial call taken by: Dedra Skeens CMA,,  June 06, 2008 11:07 AM  Follow-up for Phone Call        new order faxed. Follow-up by: Theresia Lo RN,  June 06, 2008 11:14 AM
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
## 2011-01-26 NOTE — Miscellaneous (Signed)
Summary: DNKA  Clinical Lists Changes 

## 2011-01-26 NOTE — Consult Note (Signed)
Summary: [facility]   [facility]   Imported By: Clydell Hakim 07/01/2010 14:01:29  _____________________________________________________________________  External Attachment:    Type:   Image     Comment:   External Document

## 2011-01-26 NOTE — Miscellaneous (Signed)
Summary: RX info  Prescriptions: ONETOUCH ULTRA TEST  STRP (GLUCOSE BLOOD) use as directed, QS 1 month  #1 x 11   Entered and Authorized by:   Eustaquio Boyden  MD   Signed by:   Eustaquio Boyden  MD on 08/01/2009   Method used:   Printed then faxed to ...       [facility] [place] [phone]* (retail)       [phone] [address]       [place], [place]  [address]       Ph: [phone] or [phone]       Fax: [phone]   RxID:   [id number] Biagio Borg LANCETS  MISC (LANCETS) use as directed  #1 x 11   Entered and Authorized by:   Eustaquio Boyden  MD   Signed by:   Eustaquio Boyden  MD on 08/01/2009   Method used:   Printed then faxed to ...       [facility] [place] [phone]* (retail)       [phone] [address], [place]  [address]       Ph: [phone] or [phone]       Fax: [phone]   RxID:   [id number] METFORMIN HCL 500 MG TABS (METFORMIN HCL) 1 by mouth daily x 5 days, then 1 by mouth two times a day  #60 x 3   Entered and Authorized by:   Eustaquio Boyden  MD   Signed by:   Eustaquio Boyden  MD on 08/01/2009   Method used:   Printed then faxed to ...       [facility] [place] [phone]* (retail)       [phone] [address], [place]  [address]       Ph: [phone] or [phone]       Fax: [phone]   RxID:   [id number] [facility] NICOTINE POLACRILEX 2 MG GUM (NICOTINE POLACRILEX) 1 piece every 4 hours as needed for cigarette cravings disp: QS x one month  #1 x 3   Entered and Authorized by:   Eustaquio Boyden  MD   Signed by:   Eustaquio Boyden  MD on 08/01/2009   Method used:   Printed then faxed to ...       [facility] [place] [phone]* (retail)       [phone] [address], [place]  [address]       Ph: [phone] or [phone]       Fax: [phone]   RxID:   [id number] NORVASC 10 MG TABS (AMLODIPINE BESYLATE) 1 by mouth daily  #30 x 6   Entered and Authorized by:   Eustaquio Boyden  MD   Signed by:   Eustaquio Boyden  MD on 08/01/2009   Method  used:   Printed then faxed to ...       [facility] [place] [phone]* (retail)       [phone] [address] [place]  [address]       Ph: [phone] or [phone]       Fax: [phone]   RxID:   [id number] PLAVIX 75 MG  TABS (CLOPIDOGREL BISULFATE) 1 by mouth daily  #31 x 6   Entered and Authorized by:   Eustaquio Boyden  MD   Signed by:   Eustaquio Boyden  MD  on 08/01/2009   Method used:   Printed then faxed to ...       [facility] [place] [phone]* (retail)       [phone] [address], [place]  [address]       Ph: [phone] or [phone]       Fax: [phone]   RxID:   [id number] ZOCOR 80 MG  TABS (SIMVASTATIN) 1 by mouth nightly  #31 Tablet x 2   Entered and Authorized by:   Eustaquio Boyden  MD   Signed by:   Eustaquio Boyden  MD on 08/01/2009   Method used:   Printed then faxed to ...       [facility] [place] [phone]* (retail)       [phone] [address] [place]  [address]       Ph: [phone] or [phone]       Fax: [phone]   RxID:   [id number] FLEXERIL 10 MG TABS (CYCLOBENZAPRINE HCL) one three times a day prn  #90 x 0   Entered and Authorized by:   Eustaquio Boyden  MD   Signed by:   Eustaquio Boyden  MD on 08/01/2009   Method used:   Printed then faxed to ...       [facility] [place] [phone]* (retail)       [phone] [address] [place]  [address]       Ph: [phone] or [phone]       Fax: [phone]   RxID:   [id number] ULTRAM 50 MG  TABS (TRAMADOL HCL) 2 tabs three times a day as needed pain  #90 x 0   Entered and Authorized by:   Eustaquio Boyden  MD   Signed by:   Eustaquio Boyden  MD on 08/01/2009   Method used:   Printed then faxed to ...       [facility] [place] [phone]* (retail)       [phone] [address], [place]  [address]       Ph: [phone] or [phone]       Fax: [phone]   RxID:   [id number] COREG 6.25 MG  TABS (CARVEDILOL) One tab by mouth BID  #60 x 6   Entered and Authorized by:    Eustaquio Boyden  MD   Signed by:   Eustaquio Boyden  MD on 08/01/2009   Method used:   Printed then faxed to ...       [facility] [place] [phone]* (retail)       [phone] [address] [place]  [address]       Ph: [phone] or [phone]       Fax: [phone]   RxID:   [id number] SINGULAIR 10 MG TABS (MONTELUKAST SODIUM) Take 1 tablet by mouth at bedtime  #31 x 11   Entered and Authorized by:   Eustaquio Boyden  MD   Signed by:   Eustaquio Boyden  MD on 08/01/2009   Method used:   Printed then faxed to ...       [facility] [place] [phone]* (retail)       [phone] [address]       [place], [place]  [address]       Ph: [phone] or [phone]  Fax: [phone]   RxID:   [id number] FLONASE 50 MCG/ACT SUSP (FLUTICASONE PROPIONATE) Spray 1 spray into both nostrils once a day  #16 Gram x 11   Entered and Authorized by:   Eustaquio Boyden  MD   Signed by:   Eustaquio Boyden  MD on 08/01/2009   Method used:   Printed then faxed to ...       [facility] [place] [phone]* (retail)       [phone] [address] [place]  [address]       Ph: [phone] or [phone]       Fax: [phone]   RxID:   [id number] ENALAPRIL MALEATE 20 MG TABS (ENALAPRIL MALEATE) Take 1 tablet by mouth once a day  #31 x 5   Entered and Authorized by:   Eustaquio Boyden  MD   Signed by:   Eustaquio Boyden  MD on 08/01/2009   Method used:   Printed then faxed to ...       [facility] [place] [phone]* (retail)       [phone] [address] [place]  [address]       Ph: [phone] or [phone]       Fax: [phone]   RxID:   [id number] ALBUTEROL 90 MCG/ACT AERS (ALBUTEROL) Inhale 2 puff as directed four times a day  #1 x 11   Entered and Authorized by:   Eustaquio Boyden  MD   Signed by:   Eustaquio Boyden  MD on 08/01/2009   Method used:   Printed then faxed to ...       [facility] [place] [phone]* (retail)       [phone] [address], [place]  [address]       Ph: [phone]  or [phone]       Fax: [phone]   RxID:   [id number] ADVAIR DISKUS 500-50 MCG/DOSE MISC (FLUTICASONE-SALMETEROL) Inhale 1 puff as directed twice a day  #1 x 11   Entered and Authorized by:   Eustaquio Boyden  MD   Signed by:   Eustaquio Boyden  MD on 08/01/2009   Method used:   Printed then faxed to ...       [facility] [place] [phone]* (retail)       [phone] [address]       [place], [place]  [address]       Ph: [phone] or [phone]       Fax: [phone]   RxID:   [id number]  Ms. Langseth is now wanting all her drugs faxed to [facility] at [phone] [facility] of [facility]  Will you please update this info for her.  Thanks Bradly Bienenstock  August 01, 2009 11:29 AM  Please print then fax to pharmacy.  Eustaquio Boyden  MD  August 01, 2009 2:16 PM FAXED ALL RX'S TO ABOVE NUMBER.Arlyss Repress CMA,  August 01, 2009 3:42 PM

## 2011-01-26 NOTE — Consult Note (Signed)
Summary: [facility]: CLL stage 0 - conservative management, f/u 3 mo  [facility]   Imported By: Clydell Hakim 07/22/2009 16:36:17  _____________________________________________________________________  External Attachment:    Type:   Image     Comment:   External Document  Appended Document: Cancer Center: CLL stage 0 - conservative management, f/u 3 mo    Clinical Lists Changes  Observations: Added new observation of ABS LYMPHOCY: 4.9 K/uL (07/11/2009 22:07) Added new observation of PLATELETK/UL: 250 K/uL (07/11/2009 22:07) Added new observation of HGB: 12.7 g/dL (16/09/9603 54:09) Added new observation of WBC COUNT: 12.9 10*3/microliter (07/11/2009 22:07) Added new observation of ALK PHOS: 127 units/L (07/11/2009 22:07) Added new observation of BG RANDOM: 156 mg/dL (81/19/1478 29:56) Added new observation of K SERUM: 3.3 meq/L (07/11/2009 22:07)

## 2011-01-26 NOTE — Assessment & Plan Note (Signed)
Summary: FU/tcb   Vital Signs:  Patient profile:   65 year old female Height:      61.5 inches Weight:      150.3 pounds BMI:     28.04 Temp:     98.2 degrees F oral Pulse rate:   66 / minute BP sitting:   145 / 76  (left arm) Cuff size:   regular  Vitals Entered By: Garen Grams LPN (February 11, 2010 8:56 AM) CC: f/u PT Is Patient Diabetic? Yes Did you bring your meter with you today? Yes Pain Assessment Patient in pain? yes     Location: rt side   Primary Care Provider:  Eustaquio Boyden  MD  CC:  f/u PT.  History of Present Illness: CC: f/u DM, HTN, new falls.  Presents with husband.  Filled out temporary handicap placard - 6 mo.  1. HTN - 140-160 systolic.  Recently noticed increase.  Complaint with meds.  No chest pain.  2. DM - 120 - 180 cbgs.  Taking metformin twice daily.  Has been to DM education center with a1c 7.0 11/2009.    3. HLD - on zocor.  LDL 136 last check.  4. smoking - remains at 1/4 ppd.  Having difficulty because husband and daughter still smoke ALOT.  Has children she cares for who have asthma.  Wants to return to [facility] classes once transportation gets better.  5. falls - Currently going to PT for balance program.  Has been advised to use walker daily, 4 point cane at home.  Using flexeril rarely, tramadol 1-2 times a day.  Habits & Providers  Alcohol-Tobacco-Diet     Tobacco Status: current  Current Medications (verified): 1)  Advair Diskus 500-50 Mcg/dose Misc (Fluticasone-Salmeterol) .... Inhale 1 Puff As Directed Twice A Day 2)  Albuterol 90 Mcg/act Aers (Albuterol) .... Inhale 2 Puff As Directed Four Times A Day 3)  Enalapril Maleate 20 Mg Tabs (Enalapril Maleate) .... Take 1 Tablet By Mouth Once A Day 4)  Flonase 50 Mcg/act Susp (Fluticasone Propionate) .... Spray 1 Spray Into Both Nostrils Once A Day 5)  Singulair 10 Mg Tabs (Montelukast Sodium) .... Take 1 Tablet By Mouth At Bedtime 6)  Coreg 6.25 Mg  Tabs (Carvedilol) .... One Tab  By Mouth Bid 7)  Ultram 50 Mg  Tabs (Tramadol Hcl) .... 2 Tabs Twice Daily For Pain As Needed 8)  Flexeril 10 Mg Tabs (Cyclobenzaprine Hcl) .... One By Mouth Two Times A Day As Needed Muscle Spasm 9)  Zocor 80 Mg  Tabs (Simvastatin) .Marland Kitchen.. 1 By Mouth Nightly 10)  Plavix 75 Mg  Tabs (Clopidogrel Bisulfate) .Marland Kitchen.. 1 By Mouth Daily 11)  Norvasc 10 Mg Tabs (Amlodipine Besylate) .Marland Kitchen.. 1 By Mouth Daily 12)  Cvs Nicotine Polacrilex 2 Mg Gum (Nicotine Polacrilex) .Marland Kitchen.. 1 Piece Every 4 Hours As Needed For Cigarette Cravings Disp: Qs X One Month 13)  Metformin Hcl 500 Mg Tabs (Metformin Hcl) .Marland Kitchen.. 1 By Mouth Two Times A Day 14)  Onetouch Ultrasoft Lancets  Misc (Lancets) .... Use As Directed 15)  Onetouch Ultra Test  Strp (Glucose Blood) .... Use As Directed, Qs 1 Month  Allergies (verified): 1)  ! Aspirin PMH-FH-SH reviewed for relevance  Physical Exam  General:  Alert, using walker. Msk:  tender to palpation left hamstring.  pain wiht stretching those muscles.    Impression & Recommendations:  Problem # 1:  HYPERTENSION, BENIGN SYSTEMIC (ICD-401.1)  still over desired goal.  consider adding HCTZ next visit.  Her updated medication list for this problem includes:    Enalapril Maleate 20 Mg Tabs (Enalapril maleate) .Marland Kitchen... Take 1 tablet by mouth once a day    Coreg 6.25 Mg Tabs (Carvedilol) ..... One tab by mouth bid    Norvasc 10 Mg Tabs (Amlodipine besylate) .Marland Kitchen... 1 by mouth daily  BP today: 145/76 Prior BP: 114/71 (12/10/2009)  Prior 10 Yr Risk Heart Disease: > 32 % (08/25/2009)  Labs Reviewed: K+: 3.3 (07/11/2009) Creat: : 1.03 (06/16/2009)   Chol: 208 (07/22/2009)   HDL: 34 (07/22/2009)   LDL: 136 (07/22/2009)   TG: 191 (07/22/2009)  Orders: [facility]- Est  Level 4 ([address])  Problem # 2:  DIABETES MELLITUS, TYPE II (ICD-250.00)  last A1c at goal.  will ask to return in 1 month for f/u. Her updated medication list for this problem includes:    Enalapril Maleate 20 Mg Tabs (Enalapril maleate)  .Marland Kitchen... Take 1 tablet by mouth once a day    Metformin Hcl 500 Mg Tabs (Metformin hcl) .Marland Kitchen... 1 by mouth two times a day  Labs Reviewed: Creat: 1.03 (06/16/2009)    Reviewed HgBA1c results: 7.0 (12/03/2009)  6.8 (09/25/2009)  Orders: [facility]- Est  Level 4 ([address]454)  Problem # 3:  GAIT IMBALANCE (ICD-781.2)  using walker more consistently.  RTC 1 mo for f/u.  Orders: [facility]- Est  Level 4 ([address]214)  Complete Medication List: 1)  Advair Diskus 500-50 Mcg/dose Misc (Fluticasone-salmeterol) .... Inhale 1 puff as directed twice a day 2)  Albuterol 90 Mcg/act Aers (Albuterol) .... Inhale 2 puff as directed four times a day 3)  Enalapril Maleate 20 Mg Tabs (Enalapril maleate) .... Take 1 tablet by mouth once a day 4)  Flonase 50 Mcg/act Susp (Fluticasone propionate) .... Spray 1 spray into both nostrils once a day 5)  Singulair 10 Mg Tabs (Montelukast sodium) .... Take 1 tablet by mouth at bedtime 6)  Coreg 6.25 Mg Tabs (Carvedilol) .... One tab by mouth bid 7)  Ultram 50 Mg Tabs (Tramadol hcl) .... 2 tabs twice daily for pain as needed 8)  Flexeril 10 Mg Tabs (Cyclobenzaprine hcl) .... One by mouth two times a day as needed muscle spasm 9)  Zocor 80 Mg Tabs (Simvastatin) .Marland Kitchen.. 1 by mouth nightly 10)  Plavix 75 Mg Tabs (Clopidogrel bisulfate) .Marland Kitchen.. 1 by mouth daily 11)  Norvasc 10 Mg Tabs (Amlodipine besylate) .Marland Kitchen.. 1 by mouth daily 12)  Cvs Nicotine Polacrilex 2 Mg Gum (Nicotine polacrilex) .Marland Kitchen.. 1 piece every 4 hours as needed for cigarette cravings disp: qs x one month 13)  Metformin Hcl 500 Mg Tabs (Metformin hcl) .Marland Kitchen.. 1 by mouth two times a day 14)  Onetouch Ultrasoft Lancets Misc (Lancets) .... Use as directed 15)  Onetouch Ultra Test Strp (Glucose blood) .... Use as directed, qs 1 month  Patient Instructions: 1)  Please return in 1 month.  2)  we filled out your handicap placard today for 6 months 3)  Bring all your medicines to your next visit. 4)  Keep taking metformin twice daily.  If your A1c  next month goes up, we have to talk about adding another medicine. 5)  Keep going to PT.  I'll wait to receive a summary of how things are going. Prescriptions: ALBUTEROL 90 MCG/ACT AERS (ALBUTEROL) Inhale 2 puff as directed four times a day  #1 x 3   Entered and Authorized by:   Eustaquio Boyden  MD   Signed by:   Eustaquio Boyden  MD on 02/11/2010  Method used:   Print then Give to Patient   RxID:   [id number] COREG 6.25 MG  TABS (CARVEDILOL) One tab by mouth BID  #180 x 3   Entered and Authorized by:   Eustaquio Boyden  MD   Signed by:   Eustaquio Boyden  MD on 02/11/2010   Method used:   Print then Give to Patient   RxID:   [id number] ENALAPRIL MALEATE 20 MG TABS (ENALAPRIL MALEATE) Take 1 tablet by mouth once a day  #90 x 3   Entered and Authorized by:   Eustaquio Boyden  MD   Signed by:   Eustaquio Boyden  MD on 02/11/2010   Method used:   Print then Give to Patient   RxID:   [id number] COREG 6.25 MG  TABS (CARVEDILOL) One tab by mouth BID  #180 x 3   Entered and Authorized by:   Eustaquio Boyden  MD   Signed by:   Eustaquio Boyden  MD on 02/11/2010   Method used:   Electronically to        [facility]* (mail-order)       [address]       [place], [place]  [address]       Ph: [phone]       Fax: [phone]   RxID:   [id number] ENALAPRIL MALEATE 20 MG TABS (ENALAPRIL MALEATE) Take 1 tablet by mouth once a day  #90 x 3   Entered and Authorized by:   Eustaquio Boyden  MD   Signed by:   Eustaquio Boyden  MD on 02/11/2010   Method used:   Electronically to        [facility]* (mail-order)       [address]       [place], [place]  [address]       Ph: [phone]       Fax: [phone]   RxID:   [id number] ENALAPRIL MALEATE 20 MG TABS (ENALAPRIL MALEATE) Take 1 tablet by mouth once a day  #31 x 5   Entered and Authorized by:   Eustaquio Boyden  MD   Signed by:   Eustaquio Boyden  MD on 02/11/2010    Method used:   Print then Give to Patient   RxID:   [id number] COREG 6.25 MG  TABS (CARVEDILOL) One tab by mouth BID  #60 x 6   Entered and Authorized by:   Eustaquio Boyden  MD   Signed by:   Eustaquio Boyden  MD on 02/11/2010   Method used:   Print then Give to Patient   RxID:   [id number]    Prevention & Chronic Care Immunizations   Influenza vaccine: Fluvax Non-MCR  (09/25/2009)   Influenza vaccine deferral: Deferred  (08/25/2009)   Influenza vaccine due: 08/27/2010    Tetanus booster: 09/26/2002: Done.   Tetanus booster due: 09/26/2012    Pneumococcal vaccine: Done.  (10/27/2001)   Pneumococcal vaccine due: None    H. zoster vaccine: Not documented  Colorectal Screening   Hemoccult: Done.  (02/24/1997)   Hemoccult action/deferral: Ordered  (09/25/2009)   Hemoccult due: 02/24/1998    Colonoscopy: Not documented   Colonoscopy action/deferral: Deferred  (07/22/2009)  Other Screening   Pap smear: normal  (10/01/2008)   Pap smear due: 10/02/2011    Mammogram: BI-RADS CATEGORY 4:  Suspicious abnormality - biopsy should be^MM DIGITAL DIAG LTD R  (10/22/2008)   Mammogram due: 01/27/2005    DXA bone density  scan: Not documented   Smoking status: current  (02/11/2010)   Smoking cessation counseling: yes  (12/05/2008)  Diabetes Mellitus   HgbA1C: 7.0  (12/03/2009)   Hemoglobin A1C due: 06/09/2007    Eye exam: Not documented   Diabetic eye exam action/deferral: Ophthalmology referral  (07/22/2009)    Foot exam: yes  (07/22/2009)   Foot exam action/deferral: Deferred   High risk foot: Not documented   Foot care education: Done  (07/22/2009)   Foot exam due: 11/25/2009    Urine microalbumin/creatinine ratio: Not documented    Diabetes flowsheet reviewed?: Yes   Progress toward A1C goal: At goal  Lipids   Total Cholesterol: 208  (07/22/2009)   LDL: 136  (07/22/2009)   LDL Direct: 67  (12/05/2008)   HDL: 34  (07/22/2009)   Triglycerides: 191   (07/22/2009)    SGOT (AST): 13  (06/16/2009)   SGPT (ALT): 10  (06/16/2009)   Alkaline phosphatase: 127  (07/11/2009)   Total bilirubin: 0.3  (06/16/2009)    Lipid flowsheet reviewed?: Yes   Progress toward LDL goal: Unchanged  Hypertension   Last Blood Pressure: 145 / 76  (02/11/2010)   Serum creatinine: 1.03  (06/16/2009)   Serum potassium 3.3  (07/11/2009)   Basic metabolic panel due: 09/16/2009    Hypertension flowsheet reviewed?: Yes   Progress toward BP goal: Deteriorated  Self-Management Support :   Personal Goals (by the next clinic visit) :     Personal A1C goal: 8  (12/10/2009)     Personal blood pressure goal: 140/90  (08/25/2009)     Personal LDL goal: 100  (08/25/2009)    Diabetes self-management support: CBG self-monitoring log, Written self-care plan, Education handout  (02/11/2010)   Diabetes care plan printed   Diabetes education handout printed    Hypertension self-management support: BP self-monitoring log, Written self-care plan, Education handout  (02/11/2010)   Hypertension self-care plan printed.   Hypertension education handout printed    Hypertension self-management support not done because: Good outcomes  (09/25/2009)    Lipid self-management support: Written self-care plan, Education handout  (02/11/2010)   Lipid self-care plan printed.   Lipid education handout printed

## 2011-01-26 NOTE — Assessment & Plan Note (Signed)
Summary: f/u bp [facility]   Vital Signs:  Patient Profile:   65 Years Old Female Height:     62 inches Weight:      154 pounds BMI:     28.27 Temp:     98.5 degrees F Pulse rate:   84 / minute BP sitting:   164 / 79  Pt. in pain?   yes    Location:   right hip    Intensity:   10  Vitals Entered By: Dennison Nancy RN (November 13, 2007 9:04 AM)                  PCP:  Levander Campion MD  Chief Complaint:  Office visit and right leg pain.  History of Present Illness: [name] [name] is here today because she has been having increasing pain from her right hip to her right foot.  She describes it as a throbbling, aching pain that begins a few minutes into walking or standing.  She can walk to her car outside her house and it begins to hurt.  After 30 min of rest, the pain subsides.  Denies back pain and pain shooting down her leg.  Denies any new leg weakness, bowel or bladder problems, fever.  She reports she is taking her meds regularly, although she has not taken her BP meds this AM.  Current Allergies: ! ASPIRIN      Physical Exam  General:     Well-developed,well-nourished,in no acute distress; alert,appropriate and cooperative throughout examination Neck:     no bruits no jvd Lungs:     Normal respiratory effort, chest expands symmetrically. Lungs are clear to auscultation, no crackles or wheezes. Heart:     Normal rate and regular rhythm. S1 and S2 normal without gallop, murmur, click, rub or other extra sounds. Msk:     back: nontender over spine, mildly ttp over right buttock.  Full range of motion with pain with flexion and extension.  reflexes symmetrical.  Sensation intact in lower extremities.  Strength 5/5 bilaterally.   Pulses:     left dorsalis pedis 2+, unable to palpate left DP.  No sores or ulcers, good cap refill    Impression & Recommendations:  Problem # 1:  INTERMITTENT CLAUDICATION, BILATERAL (ICD-443.9) Assessment: Deteriorated Pain concerning for  worsening claudication and blockages on the right.  Refer to vascular surgeon asap.  No acute signs of infarction.  Could also partially represent radiculopathy, but may be a combo of both.  PAD is most concerning. Orders: [facility]- Est  Level 4 ([address])   Problem # 2:  TOBACCO DEPENDENCE (ICD-305.1) Down to 2-3 cigarettes daily.  reinforced importance of quitting with PAD.  Patient continuing to decrease Orders: [facility]- Est  Level 4 ([address]454)   Problem # 3:  HYPERTENSION, BENIGN SYSTEMIC (ICD-401.1) continue current meds.   Her updated medication list for this problem includes:    Enalapril Maleate 20 Mg Tabs (Enalapril maleate) .Marland Kitchen... Take 1 tablet by mouth once a day    Hydrochlorothiazide 25 Mg Tabs (Hydrochlorothiazide) .Marland Kitchen... Take 1 tablet by mouth every morning    Coreg 6.25 Mg Tabs (Carvedilol) ..... One tab by mouth bid  Orders: [facility]- Est  Level 4 ([address]214)   Problem # 4:  HYPERCHOLESTEROLEMIA (ICD-272.0) recently restarted her pravachol  .  WIll recheck lipids and adjust as necessary Her updated medication list for this problem includes:    Pravachol 40 Mg Tabs (Pravastatin sodium) .Marland Kitchen... 1 by mouth daily  Orders: [facility]- Est  Level 4 ([address])  Future Orders: Lipid-[facility] ([id number]) ... 11/02/2008   Complete Medication List: 1)  Advair Diskus 500-50 Mcg/dose Misc (Fluticasone-salmeterol) .... Inhale 1 puff as directed twice a day 2)  Albuterol 90 Mcg/act Aers (Albuterol) .... Inhale 2 puff as directed four times a day 3)  Enalapril Maleate 20 Mg Tabs (Enalapril maleate) .... Take 1 tablet by mouth once a day 4)  Flonase 50 Mcg/act Susp (Fluticasone propionate) .... Spray 1 spray into both nostrils once a day 5)  Hydrochlorothiazide 25 Mg Tabs (Hydrochlorothiazide) .... Take 1 tablet by mouth every morning 6)  Singulair 10 Mg Tabs (Montelukast sodium) .... Take 1 tablet by mouth at bedtime 7)  Coreg 6.25 Mg Tabs (Carvedilol) .... One tab by mouth bid 8)  Ultram 50 Mg Tabs (Tramadol hcl)  .... One to two tabs by mouth q 6 hrs as needed pain 9)  Flexeril 10 Mg Tabs (Cyclobenzaprine hcl) .... 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 1 by mouth daily  Other Orders: Vascular Clinic (Vascular)   Patient Instructions: 1)  Please schedule a follow-up appointment in 1 month. 2)  Make a fasting lab appointment in next week or two to check your cholesterol. 3)  Continue to take your blood pressure and cholesterol medications. 4)  Olegario Messier will contact you about your vascular referral. 5)  If your pain gets suddenly worse, not relieved by rest, and you notice your foot is cold and blue, go to ER immediately. 6)  Continue efforts to stop smoking.    ]

## 2011-01-26 NOTE — Miscellaneous (Signed)
Summary: Orders Update  Clinical Lists Changes  Orders: Added new Test order of [facility]- Est Level  3 (99213) - Signed  

## 2011-01-26 NOTE — Assessment & Plan Note (Signed)
Summary: PT FORGOT TIME OF APPT     Current Allergies: ! ASPIRIN        Complete Medication List: 1)  Advair Diskus 500-50 Mcg/dose Misc (Fluticasone-salmeterol) .... Inhale 1 puff as directed twice a day 2)  Albuterol 90 Mcg/act Aers (Albuterol) .... Inhale 2 puff as directed four times a day 3)  Enalapril Maleate 20 Mg Tabs (Enalapril maleate) .... Take 1 tablet by mouth once a day 4)  Flonase 50 Mcg/act Susp (Fluticasone propionate) .... Spray 1 spray into both nostrils once a day 5)  Hydrochlorothiazide 25 Mg Tabs (Hydrochlorothiazide) .... Take 1 tablet by mouth every morning 6)  Singulair 10 Mg Tabs (Montelukast sodium) .... Take 1 tablet by mouth at bedtime 7)  Coreg 6.25 Mg Tabs (Carvedilol) .... One tab by mouth bid 8)  Ultram 50 Mg Tabs (Tramadol hcl) .... One to two tabs by mouth q 6 hrs as needed pain 9)  Flexeril 10 Mg Tabs (Cyclobenzaprine hcl) .... 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

## 2011-01-26 NOTE — Letter (Signed)
Summary: Generic Letter  [facility]  [address]   [place], [place] [address]   Phone: [phone]  Fax: [phone]    06/13/2008  JANALYN HIGBY [address] [place], [place]  [address]  Dear Ms. Kilduff,  Thank you for coming in to have your labs drawn. Your cholesterol was elevated along with your triglycerides and blood sugar.  Make sure you pick up your prescription for zocor (simvastatin) 80 mg and take daily.  I would like to check your blood sugar and cholesterol again after six weeks of being on all of your medications.  Please make an appointment with me in 6-8 weeks from now.  It is especially important because your vascular disease that we get these numbers under control.         Sincerely,   Levander Campion MD [facility]  Appended Document: Generic Letter Letter sent to pt via mail    ............................DELORES PATE-GADDY,CMA (AAMA)

## 2011-01-26 NOTE — Consult Note (Signed)
Summary: [facility] Hem/Onc  [facility] Hem/Onc   Imported By: De Nurse 05/22/2009 10:00:29  _____________________________________________________________________  External Attachment:    Type:   Image     Comment:   External Document

## 2011-01-26 NOTE — Progress Notes (Signed)
Summary: Triage  Phone Note Call from Patient Call back at Work Phone [phone]   Reason for Call: Talk to Nurse Summary of Call: Pt would like to speak with a nurse about having headache's again. Initial call taken by: Haydee Salter,  March 19, 2008 2:21 PM  Follow-up for Phone Call        pt states shenhas had headache off and on for a month.she has a scheduled appointment with pcp on04/03/09. she feels she can't wait until then . she wanted only to see her pcp but finally did agree to come in tomorrow to see Dr. Sandria Manly. appointment scheduled tomorrow. Follow-up by: Theresia Lo RN,  March 19, 2008 2:40 PM

## 2011-01-26 NOTE — Miscellaneous (Signed)
Summary: BMET lab order  Clinical Lists Changes  Orders: Added new Test order of Basic Met-[facility] [phone]) - Signed

## 2011-01-26 NOTE — Progress Notes (Signed)
Summary: Biopsy  Phone Note From Other Clinic   Caller: Dr. Kearney Hard at the [facility] Call For: Dr. Luz Brazen Summary of Call: Dr. Kearney Hard ( I think that is what the person said her name was on the message) would like to talk with Dr. Luz Brazen regarding pt's breast biopsy.  Her call back number is [phone].  Initial call taken by: AMY MARTIN RN,  November 01, 2008 4:43 PM  Follow-up for Phone Call        called number back and left voice mail message. Follow-up by: Levander Campion MD,  November 04, 2008 9:23 AM  Additional Follow-up for Phone Call Additional follow up Details #1::        Spoke with Dr. Kearney Hard and she informed me of patient's biopsy results. Additional Follow-up by: Levander Campion MD,  November 06, 2008 8:44 AM

## 2011-01-26 NOTE — Letter (Signed)
Summary: Generic Letter  [facility]  [address]   [place], [place] [address]   Phone: [phone]  Fax: [phone]    10/21/2010 MRN: [id number]  [address] [place], [place]  [address]  Dear Ms. Zilka,  I was unable to reach you by phone so I'm sending you a letter to let you know that I have scheduled you an appointment with Dr. Nadyne Coombes to have your eyes checked.  I have listed the date and time of the appointment below.  Dr. Mitzi Davenport is located at [address] and the office phone number is [phone]. If you cannot keep this appointment please call their office to reschedule.  Feel free to call us if you have questions.  Date:  Wednesday November 11, 2010 Time:  2:35 pm         Sincerely,   Dennison Nancy RN [facility]

## 2011-01-26 NOTE — Consult Note (Signed)
Summary: [facility] - Education  [facility]   Imported By: Clydell Hakim 06/17/2009 13:23:54  _____________________________________________________________________  External Attachment:    Type:   Image     Comment:   External Document

## 2011-01-26 NOTE — Assessment & Plan Note (Signed)
Summary: f/u per Delano Frate/tcb   Vital Signs:  Patient profile:   65 year old female Height:      61.5 inches Weight:      145 pounds BMI:     27.05 Temp:     98.1 degrees F oral Pulse rate:   58 / minute BP sitting:   114 / 71  (right arm) Cuff size:   regular  Vitals Entered By: Tessie Fass CMA (December 10, 2009 11:23 AM) CC: F/U BP adn diabetes Is Patient Diabetic? Yes Pain Assessment Patient in pain? no        Primary Care Provider:  Eustaquio Boyden  MD  CC:  F/U BP adn diabetes.  History of Present Illness: CC: f/u DM, HTN, new falls.  Presents with husband.  1. HTN - 110-150 systolic previously.  has not been checking recently.  Complaint with meds.  No chest pain.  2. DM - 100 - 130 cbgs.  states has been having diarrhea from metformin so stops for days at a time.  hasn't set up eye doctor, Has been [facility] with a1c 7.0 11/2009.    3. HLD - on zocor.  LDL 136 last check.  4. smoking - down to 1/4 ppd!!  Met with Dr. Pascal Lux but hasn't been able to make [place] classes b/c car broke down.  Having difficulty because husband and daughter still smoke ALOT.  Has children she cares for who have asthma.  5. falls - has been having increasing falls recently, latest one last night where she was walking in bedroom, staggered and fell.  Last few days had fall in kitchen, last week fell down 3 steps outside in back porch.  Front door has no steps.  1 story house.  Has walker but does not use.  Says she has been to PT for this, but I see no records of this.  denies feeling dizzy.  did not check sugar or blood pressure with fall.  does not pass out.  Is on high dose of flexeril and ultram per previous provider whcih she uses for muscle cramps and back pain.  Habits & Providers  Alcohol-Tobacco-Diet     Tobacco Status: current     Cigarette Packs/Day: 0.25  Current Medications (verified): 1)  Advair Diskus 500-50 Mcg/dose Misc (Fluticasone-Salmeterol) ....  Inhale 1 Puff As Directed Twice A Day 2)  Albuterol 90 Mcg/act Aers (Albuterol) .... Inhale 2 Puff As Directed Four Times A Day 3)  Enalapril Maleate 20 Mg Tabs (Enalapril Maleate) .... Take 1 Tablet By Mouth Once A Day 4)  Flonase 50 Mcg/act Susp (Fluticasone Propionate) .... Spray 1 Spray Into Both Nostrils Once A Day 5)  Singulair 10 Mg Tabs (Montelukast Sodium) .... Take 1 Tablet By Mouth At Bedtime 6)  Coreg 6.25 Mg  Tabs (Carvedilol) .... One Tab By Mouth Bid 7)  Ultram 50 Mg  Tabs (Tramadol Hcl) .... 2 Tabs Twice Daily For Pain As Needed 8)  Flexeril 10 Mg Tabs (Cyclobenzaprine Hcl) .... One By Mouth Two Times A Day As Needed Muscle Spasm 9)  Zocor 80 Mg  Tabs (Simvastatin) .Marland Kitchen.. 1 By Mouth Nightly 10)  Plavix 75 Mg  Tabs (Clopidogrel Bisulfate) .Marland Kitchen.. 1 By Mouth Daily 11)  Norvasc 10 Mg Tabs (Amlodipine Besylate) .Marland Kitchen.. 1 By Mouth Daily 12)  Cvs Nicotine Polacrilex 2 Mg Gum (Nicotine Polacrilex) .Marland Kitchen.. 1 Piece Every 4 Hours As Needed For Cigarette Cravings Disp: Qs X One Month 13)  Metformin Hcl 500 Mg  Tabs (Metformin Hcl) .Marland Kitchen.. 1 By Mouth Two Times A Day 14)  Onetouch Ultrasoft Lancets  Misc (Lancets) .... Use As Directed 15)  Onetouch Ultra Test  Strp (Glucose Blood) .... Use As Directed, Qs 1 Month  Allergies (verified): 1)  ! Aspirin  Past History:  Past medical, surgical, family and social histories (including risk factors) reviewed for relevance to current acute and chronic problems.  Past Medical History: Reviewed history from 07/02/2009 and no changes required.  brain tumor - meningioma removed 1998-[facility], H/o dysphagia--resolved with esoph dilation  R CVA s/p meningioma removal Current Problems:  DIABETES MELLITUS, TYPE II (ICD-250.00) --> [facility] LEUKEMIA, LYMPHOCYTIC, CHRONIC (ICD-204.10) LOW BACK PAIN SYNDROME, SEVERE (ICD-724.2) ADJUSTMENT DISORDER WITH DEPRESSED MOOD (ICD-309.0) ROUTINE GYNECOLOGICAL  EXAMINATION (ICD-V72.31) SPECIAL SCREENING FOR MALIGNANT NEOPLASMS COLON (ICD-V76.51) SCREENING FOR MALIGNANT NEOPLASM OF THE CERVIX (ICD-V76.2) PERIPHERAL VASCULAR DISEASE (ICD-443.9) TOBACCO DEPENDENCE (ICD-305.1) HYPERTENSION, BENIGN SYSTEMIC (ICD-401.1) HYPERCHOLESTEROLEMIA (ICD-272.0) GASTROESOPHAGEAL REFLUX, NO ESOPHAGITIS (ICD-530.81) COPD (ICD-496) ANXIETY (ICD-300.00)  Past Surgical History: Reviewed history from 07/22/2009 and no changes required. Cardiac Cath: normal coronary arteries 02/24/2002 Cholecystectomy - 03/27/2002 EGD: esophageal dilation (Dr. Madilyn Fireman); esophagus was diffusely dilated distally w/ abrupt taper into LES-?achalasia - 01/28/2004 Spirometry: mod obstr dz w/ mod improvement post-bronchodilator - 03/27/2004 Bypass grafting of RLE for PAD claudication - SFA patent with popliteal stent in place (12/28/2006) Cardiac Cath: normal CA 04/05/2007  Family History: Reviewed history from 02/23/2007 and no changes required. `diabetes in several family members`, Brother--IDDM, Father--unknown (was not involved with her), Mother--deceased May 14, 2004; CAD, Unsure about other family history  Social History: Reviewed history from 09/25/2009 and no changes required. 1 pack every 3 days.  h/o quitting in past.  Lives with husband, 3 grandchildren in house; Denies alcohol, illicit drugs Brother passed away 02-14-07, sister had a stroke.  Stress with grandchildren. Married  Physical Exam  General:  Alert, sitting upright and active appearing Lungs:  Normal respiratory effort, chest expands symmetrically. Lungs are clear to auscultation, no crackles or wheezes. Heart:  normal S1, S2, no m/r/g Abdomen:  +bs, distended but soft, tender in RLQ, but not consistenly reproducable Pulses:  2+ periph pulses Extremities:  no edema Neurologic:  no pronator drift.  cerebellar function stable.  no romberg.  get up and go test - about 25 seconds.   side to side gait - somewhat wobbly.  unable to do  tandem gait.   Impression & Recommendations:  Problem # 1:  GAIT IMBALANCE (ICD-781.2) send to PT as I have no records of pt going previously.  asked to bring walker.  If continued, check EKG as today bradycardic.  but doesn't endorse sob.  asked to check BP and cgb on next fall to ensure not orthostatic or hypoglycemic.  doubt this is syncope either.  Check orthostatics next visit.  Orders: Physical Therapy Referral (PT) [facility]- Est  Level 4 (21308)  Problem # 2:  HYPERTENSION, BENIGN SYSTEMIC (ICD-401.1)  stable today.  Her updated medication list for this problem includes:    Enalapril Maleate 20 Mg Tabs (Enalapril maleate) .Marland Kitchen... Take 1 tablet by mouth once a day    Coreg 6.25 Mg Tabs (Carvedilol) ..... One tab by mouth bid    Norvasc 10 Mg Tabs (Amlodipine besylate) .Marland Kitchen... 1 by mouth daily  BP today: 114/71 Prior BP: 150/84 (11/06/2009)  Prior 10 Yr Risk Heart Disease: > 32 % (08/25/2009)  Labs Reviewed: K+: 3.3 (07/11/2009) Creat: : 1.03 (06/16/2009)   Chol:  208 (07/22/2009)   HDL: 34 (07/22/2009)   LDL: 136 (07/22/2009)   TG: 191 (07/22/2009)  Orders: [facility]- Est  Level 4 (65784)  Problem # 3:  DIABETES MELLITUS, TYPE II (ICD-250.00) stable.  doing very well with this. Her updated medication list for this problem includes:    Enalapril Maleate 20 Mg Tabs (Enalapril maleate) .Marland Kitchen... Take 1 tablet by mouth once a day    Metformin Hcl 500 Mg Tabs (Metformin hcl) .Marland Kitchen... 1 by mouth two times a day  Orders: Physical Therapy Referral (PT) [facility]- Est  Level 4 (69629)  Labs Reviewed: Creat: 1.03 (06/16/2009)    Reviewed HgBA1c results: 6.8 (09/25/2009)  7.5 (06/02/2009)  Problem # 4:  HYPERCHOLESTEROLEMIA (ICD-272.0)  Her updated medication list for this problem includes:    Zocor 80 Mg Tabs (Simvastatin) .Marland Kitchen... 1 by mouth nightly  Labs Reviewed: SGOT: 13 (06/16/2009)   SGPT: 10 (06/16/2009)  Lipid Goals: Chol Goal: 200 (08/25/2009)   HDL Goal: 40 (08/25/2009)   LDL Goal: 70  (08/25/2009)   TG Goal: 150 (08/25/2009)  Prior 10 Yr Risk Heart Disease: > 32 % (08/25/2009)   HDL:34 (07/22/2009), 36 (06/12/2008)  LDL:136 (07/22/2009), 126 (06/12/2008)  Chol:208 (07/22/2009), 238 (06/12/2008)  Trig:191 (07/22/2009), 379 (06/12/2008)  Orders: [facility]- Est  Level 4 (99214)  Problem # 5:  TOBACCO DEPENDENCE (ICD-305.1)  Her updated medication list for this problem includes:    Cvs Nicotine Polacrilex 2 Mg Gum (Nicotine polacrilex) .Marland Kitchen... 1 piece every 4 hours as needed for cigarette cravings disp: qs x one month  Encouraged smoking cessation and discussed different methods for smoking cessation.  doing great - down to 1/4 ppd.  likely will need help in recruiting family members to also quit.  Orders: [facility]- Est  Level 4 (99214)  Complete Medication List: 1)  Advair Diskus 500-50 Mcg/dose Misc (Fluticasone-salmeterol) .... Inhale 1 puff as directed twice a day 2)  Albuterol 90 Mcg/act Aers (Albuterol) .... Inhale 2 puff as directed four times a day 3)  Enalapril Maleate 20 Mg Tabs (Enalapril maleate) .... Take 1 tablet by mouth once a day 4)  Flonase 50 Mcg/act Susp (Fluticasone propionate) .... Spray 1 spray into both nostrils once a day 5)  Singulair 10 Mg Tabs (Montelukast sodium) .... Take 1 tablet by mouth at bedtime 6)  Coreg 6.25 Mg Tabs (Carvedilol) .... One tab by mouth bid 7)  Ultram 50 Mg Tabs (Tramadol hcl) .... 2 tabs twice daily for pain as needed 8)  Flexeril 10 Mg Tabs (Cyclobenzaprine hcl) .... One by mouth two times a day as needed muscle spasm 9)  Zocor 80 Mg Tabs (Simvastatin) .Marland Kitchen.. 1 by mouth nightly 10)  Plavix 75 Mg Tabs (Clopidogrel bisulfate) .Marland Kitchen.. 1 by mouth daily 11)  Norvasc 10 Mg Tabs (Amlodipine besylate) .Marland Kitchen.. 1 by mouth daily 12)  Cvs Nicotine Polacrilex 2 Mg Gum (Nicotine polacrilex) .Marland Kitchen.. 1 piece every 4 hours as needed for cigarette cravings disp: qs x one month 13)  Metformin Hcl 500 Mg Tabs (Metformin hcl) .Marland Kitchen.. 1 by mouth two times a day 14)   Onetouch Ultrasoft Lancets Misc (Lancets) .... Use as directed 15)  Onetouch Ultra Test Strp (Glucose blood) .... Use as directed, qs 1 month  Patient Instructions: 1)  Please return in 1 month for follow up of PT recommendations. 2)  Good job with your diabetes and blood pressure!  Please keep a log of blood pressures to make sure they are not too low. 3)  Also, next time  you feel faint or like you're gonna fall, check your sugar and blood pressure. 4)  Good job with your smoking! 5)  I will send you to physical therapy for a gait assessment.  6)  Call clinic with questions.  Pleasure to see you today! Prescriptions: NORVASC 10 MG TABS (AMLODIPINE BESYLATE) 1 by mouth daily  #30 x 6   Entered and Authorized by:   Eustaquio Boyden  MD   Signed by:   Eustaquio Boyden  MD on 12/10/2009   Method used:   Electronically to        [facility]. [phone]* (retail)       [address] [place]  [address]       Ph: [phone] or [phone]       Fax: [phone]   RxID:   [id number] PLAVIX 75 MG  TABS (CLOPIDOGREL BISULFATE) 1 by mouth daily  #31 x 6   Entered and Authorized by:   Eustaquio Boyden  MD   Signed by:   Eustaquio Boyden  MD on 12/10/2009   Method used:   Electronically to        [facility]. [phone]* (retail)       [address], [place]  [address]       Ph: [phone] or [phone]       Fax: [phone]   RxID:   [id number] FLEXERIL 10 MG TABS (CYCLOBENZAPRINE HCL) one by mouth two times a day as needed muscle spasm  #60 x 0   Entered and Authorized by:   Eustaquio Boyden  MD   Signed by:   Eustaquio Boyden  MD on 12/10/2009   Method used:   Electronically to        [facility]. [phone]* (retail)       [address], [place]  [address]       Ph: [phone] or [phone]       Fax: [phone]   RxID:   [id number] Janean Sark 50 MG  TABS (TRAMADOL HCL) 2 tabs twice daily for pain as needed  #60 x 0   Entered  and Authorized by:   Eustaquio Boyden  MD   Signed by:   Eustaquio Boyden  MD on 12/10/2009   Method used:   [facility]ronically to        [facility]. [phone]* (retail)       [address], [place]  [address]       Ph: [phone] or [phone]       Fax: [phone]   RxID:   [id number] FLEXERIL 10 MG TABS (CYCLOBENZAPRINE HCL) one by mouth two times a day as needed muscle spasm  #60 x 0   Entered and Authorized by:   Eustaquio Boyden  MD   Signed by:   Eustaquio Boyden  MD on 12/10/2009   Method used:   Print then Give to Patient   RxID:   [id number] ULTRAM 50 MG  TABS (TRAMADOL HCL) 2 tabs twice daily for pain as needed  #60 x 0   Entered and Authorized by:   Eustaquio Boyden  MD   Signed by:   Eustaquio Boyden  MD on 12/10/2009   Method used:   Print then Give to Patient   RxID:   [id number]  PLAVIX 75 MG  TABS (CLOPIDOGREL BISULFATE) 1 by mouth daily  #31 x 6   Entered and Authorized by:   Eustaquio Boyden  MD   Signed by:   Eustaquio Boyden  MD on 12/10/2009   Method used:   Print then Give to Patient   RxID:   [id number] NORVASC 10 MG TABS (AMLODIPINE BESYLATE) 1 by mouth daily  #30 x 6   Entered and Authorized by:   Eustaquio Boyden  MD   Signed by:   Eustaquio Boyden  MD on 12/10/2009   Method used:   Print then Give to Patient   RxID:   [id number]    Prevention & Chronic Care Immunizations   Influenza vaccine: Fluvax Non-MCR  (09/25/2009)   Influenza vaccine deferral: Deferred  (08/25/2009)   Influenza vaccine due: 08/27/2010    Tetanus booster: 09/26/2002: Done.   Tetanus booster due: 09/26/2012    Pneumococcal vaccine: Done.  (10/27/2001)   Pneumococcal vaccine due: None    H. zoster vaccine: Not documented  Colorectal Screening   Hemoccult: Done.  (02/24/1997)   Hemoccult action/deferral: Ordered  (09/25/2009)   Hemoccult due: 02/24/1998    Colonoscopy: Not documented   Colonoscopy action/deferral: Deferred   (07/22/2009)  Other Screening   Pap smear: normal  (10/01/2008)   Pap smear due: 09/2011    Mammogram: BI-RADS CATEGORY 4:  Suspicious abnormality - biopsy should be^MM DIGITAL DIAG LTD R  (10/22/2008)   Mammogram due: 01/27/2005    DXA bone density scan: Not documented   Smoking status: current  (12/10/2009)   Smoking cessation counseling: yes  (12/05/2008)  Diabetes Mellitus   HgbA1C: 7.0  (12/03/2009)   Hemoglobin A1C due: 06/09/2007    Eye exam: Not documented   Diabetic eye exam action/deferral: Ophthalmology referral  (07/22/2009)    Foot exam: yes  (07/22/2009)   Foot exam action/deferral: Deferred   High risk foot: Not documented   Foot care education: Done  (07/22/2009)   Foot exam due: 11/25/2009    Urine microalbumin/creatinine ratio: Not documented    Diabetes flowsheet reviewed?: Yes   Progress toward A1C goal: At goal  Lipids   Total Cholesterol: 208  (07/22/2009)   LDL: 136  (07/22/2009)   LDL Direct: 67  (12/05/2008)   HDL: 34  (07/22/2009)   Triglycerides: 191  (07/22/2009)    SGOT (AST): 13  (06/16/2009)   SGPT (ALT): 10  (06/16/2009)   Alkaline phosphatase: 127  (07/11/2009)   Total bilirubin: 0.3  (06/16/2009)    Lipid flowsheet reviewed?: Yes   Progress toward LDL goal: Unchanged  Hypertension   Last Blood Pressure: 114 / 71  (12/10/2009)   Serum creatinine: 1.03  (06/16/2009)   Serum potassium 3.3  (07/11/2009)   Basic metabolic panel due: 09/16/2009    Hypertension flowsheet reviewed?: Yes   Progress toward BP goal: At goal  Self-Management Support :   Personal Goals (by the next clinic visit) :     Personal A1C goal: 8  (12/10/2009)     Personal blood pressure goal: 140/90  (08/25/2009)     Personal LDL goal: 100  (08/25/2009)    Diabetes self-management support: Copy of home glucose meter record, CBG self-monitoring log, Written self-care plan, Referred for DM self-management training, Referred for medical nutrition therapy   (11/06/2009)    Hypertension self-management support: BP self-monitoring log, Written self-care plan, Referred for medical nutrition therapy  (11/06/2009)    Hypertension self-management support not done because: Good outcomes  (09/25/2009)  Lipid self-management support: Written self-care plan, Referred for self-management class, Referred for medical nutrition therapy  (11/06/2009)    Appended Document: f/u per Yanni Quiroa/tcb Received PT recs - 2x/wk for 8 wks for balance training.

## 2011-01-26 NOTE — Assessment & Plan Note (Signed)
Summary: elevated bp/ls  Medications Added ADVAIR DISKUS 500-50 MCG/DOSE MISC (FLUTICASONE-SALMETEROL) Inhale 1 puff as directed twice a day ALBUTEROL 90 MCG/ACT AERS (ALBUTEROL) Inhale 2 puff as directed four times a day ENALAPRIL MALEATE 20 MG TABS (ENALAPRIL MALEATE) Take 1 tablet by mouth once a day FLONASE 50 MCG/ACT SUSP (FLUTICASONE PROPIONATE) Spray 1 spray into both nostrils once a day HYDROCHLOROTHIAZIDE 25 MG TABS (HYDROCHLOROTHIAZIDE) Take 1 tablet by mouth every morning SINGULAIR 10 MG TABS (MONTELUKAST SODIUM) Take 1 tablet by mouth at bedtime COREG 6.25 MG  TABS (CARVEDILOL) One tab by mouth BID ULTRAM 50 MG  TABS (TRAMADOL HCL) One to two tabs by mouth q 6 hrs as needed pain FLEXERIL 10 MG TABS (CYCLOBENZAPRINE HCL) One tab by mouth three times a day as needed (do not drive or operate machinary when taking this med.)      Allergies Added: ! ASPIRIN  Vital Signs:  Patient Profile:   65 Years Old Female Height:     62 inches Weight:      156.8 pounds BMI:     28.78 Temp:     98.4 degrees F Pulse rate:   67 / minute BP sitting:   159 / 87  (left arm)  Pt. in pain?   yes    Location:   rightside    Intensity:   10  Vitals Entered By: Angeline Slim MD (August 30, 2007 11:06 AM)                  PCP:  Levander Campion MD  Chief Complaint:  elevated blood pressures.  History of Present Illness: 64 y/o AAF with:  1) Low back pain - Thjis is Sharp and radiates to her right leg.  It has been going on for 2-3 months , but is worse more recently (2 weeks).  It is brought on by exercise which she enjoys doing.  Typically after about one lap on the track her symptoms will start.  She has tried tylenol which hasn't helped.  Her cardiologist had recommended that she have an "X-ray" of the leg and back to check for circulation, but she never went to have it done Algie Coffer).  Not described as a numbness or tingly feeling.  No saddle paresthesia or lower leg  numbness/weakness.  Improves with rest.  2) Hypertension - This is out of control today.  She is on enalapril 20, hctz 25.  Taking these medicines.  No chest pain, syncope, dyspnea.  3) Hyperlipidemia - Not taking her pravachol for an unknown reason.  4) Asthma - Currently taking both albuterol and advair on as needed basis.  No recent wheezing/cough/dyspnea.  Current Allergies: ! ASPIRIN    Risk Factors:     Counseled to quit/cut down tobacco use:  yes    Physical Exam  General:     Well-developed,well-nourished,in no acute distress; alert,appropriate and cooperative throughout examination  Some movements seem painful to her and she grabs for her back. Msk:     Tender at lower right back area around the SI joint and the lumbar paraspinous muscles.  Normal Hip exam.  Couldn't lie down to do pyriformis testing or FABER testing.  + straight leg test on the left.  Good strength distally. Pulses:     DP: 1+ right 2+ left Extremities:     No edema Skin:     Intact without suspicious lesions or rashes Psych:     Cognition and judgment appear intact. Alert and cooperative with  normal attention span and concentration. No apparent delusions, illusions, hallucinations    Impression & Recommendations:  Problem # 1:  BACK PAIN, LOW (ICD-724.2) Assessment: Deteriorated Would have a low threshold for X-ray of hip and low back.  Hopefully this acute episode will pass with medication management of her symptoms.  SI pathology and sciatica are still in the differential, but a good exam couldn't be performed.  Claudication could also be involved, so an ABI may be warranted, but her pulse is certainly palpable on the right, and her pain is more upper leg than lower leg.  Her updated medication list for this problem includes:    Ultram 50 Mg Tabs (Tramadol hcl) ..... One to two tabs by mouth q 6 hrs as needed pain    Flexeril 10 Mg Tabs (Cyclobenzaprine hcl) ..... One tab by mouth three  times a day as needed (do not drive or operate machinary when taking this med.)  Orders: [facility]- Est  Level 4 ([address]109)   Problem # 2:  ASTHMA, UNSPECIFIED (ICD-493.90) Assessment: Unchanged Educated about advair vs albuterol. Her updated medication list for this problem includes:    Advair Diskus 500-50 Mcg/dose Misc (Fluticasone-salmeterol) ..... Inhale 1 puff as directed twice a day    Albuterol 90 Mcg/act Aers (Albuterol) ..... Inhale 2 puff as directed four times a day    Singulair 10 Mg Tabs (Montelukast sodium) .Marland Kitchen... Take 1 tablet by mouth at bedtime  Orders: [facility]- Est  Level 4 (60454)   Problem # 3:  HYPERTENSION, BENIGN SYSTEMIC (ICD-401.1) Assessment: Deteriorated Added coreg 6.25.  Watch for bradycardia. Her updated medication list for this problem includes:    Enalapril Maleate 20 Mg Tabs (Enalapril maleate) .Marland Kitchen... Take 1 tablet by mouth once a day    Hydrochlorothiazide 25 Mg Tabs (Hydrochlorothiazide) .Marland Kitchen... Take 1 tablet by mouth every morning    Coreg 6.25 Mg Tabs (Carvedilol) ..... One tab by mouth bid  Orders: [facility]- Est  Level 4 (09811)   Problem # 4:  HYPERCHOLESTEROLEMIA (ICD-272.0) Not taking pravachol.  WIll need to be readdressed at her next visit with her primary MD. Orders: [facility]- Est  Level 4 (91478)   Complete Medication List: 1)  Advair Diskus 500-50 Mcg/dose Misc (Fluticasone-salmeterol) .... Inhale 1 puff as directed twice a day 2)  Albuterol 90 Mcg/act Aers (Albuterol) .... Inhale 2 puff as directed four times a day 3)  Enalapril Maleate 20 Mg Tabs (Enalapril maleate) .... Take 1 tablet by mouth once a day 4)  Flonase 50 Mcg/act Susp (Fluticasone propionate) .... Spray 1 spray into both nostrils once a day 5)  Hydrochlorothiazide 25 Mg Tabs (Hydrochlorothiazide) .... Take 1 tablet by mouth every morning 6)  Singulair 10 Mg Tabs (Montelukast sodium) .... Take 1 tablet by mouth at bedtime 7)  Coreg 6.25 Mg Tabs (Carvedilol) .... One tab by mouth bid 8)   Ultram 50 Mg Tabs (Tramadol hcl) .... One to two tabs by mouth q 6 hrs as needed pain 9)  Flexeril 10 Mg Tabs (Cyclobenzaprine hcl) .... One tab by mouth three times a day as needed (do not drive or operate machinary when taking this med.)   Patient Instructions: 1)  F/up with Luz Brazen in one month 2)  You can use ultram/flexeril for the back pain. 3)  Be sure to start your new blood pressure medicine (coreg twice daily). 4)  Tobacco is very bad for your health and your loved ones! You Should stop smoking!.    Prescriptions: FLEXERIL 10  MG TABS (CYCLOBENZAPRINE HCL) One tab by mouth three times a day as needed (do not drive or operate machinary when taking this med.)  #30 x 1   Entered and Authorized by:   Angeline Slim MD   Signed by:   Angeline Slim MD on 08/30/2007   Method used:   Print then Give to Patient   RxID:   [id number] ULTRAM 50 MG  TABS (TRAMADOL HCL) One to two tabs by mouth q 6 hrs as needed pain  #45 x 2   Entered and Authorized by:   Angeline Slim MD   Signed by:   Angeline Slim MD on 08/30/2007   Method used:   Print then Give to Patient   RxID:   [id number] COREG 6.25 MG  TABS (CARVEDILOL) One tab by mouth BID  #62 x 11   Entered and Authorized by:   Angeline Slim MD   Signed by:   Angeline Slim MD on 08/30/2007   Method used:   Print then Give to Patient   RxID:   [id number]

## 2011-01-26 NOTE — Miscellaneous (Signed)
Summary: PATIENT SUMMARY  Clinical Lists Changes  Observations: Added new observation of HTN PROGRESS: Improved (06/18/2010 14:07) Added new observation of HTN FSREVIEW: Yes (06/18/2010 14:07) Added new observation of LIPID PROGRS: Improved (06/18/2010 14:07) Added new observation of LIPID FSREVW: Yes (06/18/2010 14:07) Added new observation of DM PROGRESS: At goal (06/18/2010 14:07) Added new observation of DM FSREVIEW: Yes (06/18/2010 14:07) Added new observation of MAMMO DUE: 10/22/2009 (06/18/2010 14:07) Added new observation of PNEUMVXNEXT: 10/27/2006 (06/18/2010 14:07) Added new observation of PAST MED HX:  brain tumor - meningioma removed 1998-[facility], H/o dysphagia--resolved with esoph dilation  R CVA s/p meningioma removal Current Problems:  DIABETES MELLITUS, TYPE II (ICD-250.00) --> [facility] LEUKEMIA, LYMPHOCYTIC, CHRONIC (ICD-204.10) found on mammo with abnormal LN after biopsy 2009 (stage 0, indolent) LOW BACK PAIN SYNDROME, SEVERE (ICD-724.2) ADJUSTMENT DISORDER WITH DEPRESSED MOOD (ICD-309.0) ROUTINE GYNECOLOGICAL EXAMINATION (ICD-V72.31) SPECIAL SCREENING FOR MALIGNANT NEOPLASMS COLON (ICD-V76.51) SCREENING FOR MALIGNANT NEOPLASM OF THE CERVIX (ICD-V76.2) PERIPHERAL VASCULAR DISEASE (ICD-443.9) - ABI 0.68 2011, h/o bypass RLE TOBACCO DEPENDENCE (ICD-305.1) HYPERTENSION, BENIGN SYSTEMIC (ICD-401.1) HYPERCHOLESTEROLEMIA (ICD-272.0) GASTROESOPHAGEAL REFLUX, NO ESOPHAGITIS (ICD-530.81) COPD (ICD-496) ANXIETY (ICD-300.00)   (06/18/2010 14:07) Added new observation of PMH OTHER: Past medical, surgical, family and social histories (including risk factors) reviewed, and no changes noted (except as noted below). (06/18/2010 14:07) Added new observation of SH REVIEWED: reviewed - no changes required (06/18/2010 14:07) Added new observation of FH REVIEWED: reviewed - no changes required (06/18/2010 14:07) Added new  observation of PSH REVIEWED: reviewed - no changes required (06/18/2010 14:07) Added new observation of PMH REVIEWED: reviewed - no changes required (06/18/2010 14:07)      Pleasant 65 yo with h/o meningioma s/p removal 1998, stage 0 indolent CLL followed by Dr. Arline Asp (onc), HTN, DM, HLD, PAD s/p bypass graft 2008 and back pain.  Most recently have been working up leg pain, concern for restenosis with decreased ABIs and sxs of claudication, so have referred back to Dr. Myra Gianotti for eval.  Needs preventative medicine update - needs complete physical.  Have just seen for chronic disease management/acute issues.   Past History:  Past medical, surgical, family and social histories (including risk factors) reviewed, and no changes noted (except as noted below).  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 LEUKEMIA, LYMPHOCYTIC, CHRONIC (ICD-204.10) found on mammo with abnormal LN after biopsy 2009 (stage 0, indolent) LOW BACK PAIN SYNDROME, SEVERE (ICD-724.2) ADJUSTMENT DISORDER WITH DEPRESSED MOOD (ICD-309.0) ROUTINE GYNECOLOGICAL EXAMINATION (ICD-V72.31) SPECIAL SCREENING FOR MALIGNANT NEOPLASMS COLON (ICD-V76.51) SCREENING FOR MALIGNANT NEOPLASM OF THE CERVIX (ICD-V76.2) PERIPHERAL VASCULAR DISEASE (ICD-443.9) - ABI 0.68 2011, h/o bypass RLE TOBACCO DEPENDENCE (ICD-305.1) HYPERTENSION, BENIGN SYSTEMIC (ICD-401.1) HYPERCHOLESTEROLEMIA (ICD-272.0) GASTROESOPHAGEAL REFLUX, NO ESOPHAGITIS (ICD-530.81) COPD (ICD-496) ANXIETY (ICD-300.00)  Past Surgical History: Reviewed history from 07/22/2009 and no changes required. Cardiac Cath: normal coronary arteries 02/24/2002 Cholecystectomy - 03/27/2002 EGD: esophageal dilation (Dr. Madilyn Fireman); esophagus was diffusely  dilated distally w/ abrupt taper into LES-?achalasia - 01/28/2004 Spirometry: mod obstr dz w/ mod improvement post-bronchodilator - 03/27/2004 Bypass grafting of RLE for PAD claudication - SFA patent with popliteal stent in place (12/28/2006) Cardiac Cath: normal CA 04/05/2007    Prevention & Chronic Care Immunizations   Influenza vaccine: Fluvax Non-MCR  (09/25/2009)   Influenza vaccine deferral: Deferred  (08/25/2009)  Influenza vaccine due: 08/27/2010    Tetanus booster: 09/26/2002: Done.   Tetanus booster due: 09/26/2012    Pneumococcal vaccine: Done.  (10/27/2001)   Pneumococcal vaccine due: 10/27/2006    H. zoster vaccine: Not documented  Colorectal Screening   Hemoccult: Done.  (02/24/1997)   Hemoccult action/deferral: Ordered  (09/25/2009)   Hemoccult due: 02/24/1998    Colonoscopy: Not documented   Colonoscopy action/deferral: Deferred  (07/22/2009)  Other Screening   Pap smear: normal  (10/01/2008)   Pap smear action/deferral: Deferred-3 yr interval  (04/13/2010)   Pap smear due: 10/02/2011    Mammogram: BI-RADS CATEGORY 4:  Suspicious abnormality - biopsy should be^MM DIGITAL DIAG LTD R  (10/22/2008)   Mammogram due: 10/22/2009    DXA bone density scan: Not documented   Smoking status: current  (05/15/2010)   Smoking cessation counseling: yes  (12/05/2008)   Target quit date: 05/27/2010  (04/21/2010)  Diabetes Mellitus   HgbA1C: 6.9  (03/11/2010)   Hemoglobin A1C due: 06/09/2007    Eye exam: Not documented   Diabetic eye exam action/deferral: Ophthalmology referral  (07/22/2009)    Foot exam: yes  (07/22/2009)   Foot exam action/deferral: Deferred   High risk foot: Not documented   Foot care education: Done  (07/22/2009)   Foot exam due: 11/25/2009    Urine microalbumin/creatinine ratio: Not documented    Diabetes flowsheet reviewed?: Yes   Progress toward A1C goal: At goal  Lipids   Total Cholesterol: 208  (07/22/2009)   LDL: 136  (07/22/2009)    LDL Direct: 54  (03/11/2010)   HDL: 34  (07/22/2009)   Triglycerides: 191  (07/22/2009)    SGOT (AST): 19  (03/11/2010)   SGPT (ALT): 21  (03/11/2010)   Alkaline phosphatase: 116  (03/11/2010)   Total bilirubin: 0.3  (03/11/2010)    Lipid flowsheet reviewed?: Yes   Progress toward LDL goal: Improved  Hypertension   Last Blood Pressure: 134 / 78  (05/15/2010)   Serum creatinine: 1.03  (03/11/2010)   Serum potassium 4.2  (03/11/2010)   Basic metabolic panel due: 09/16/2009    Hypertension flowsheet reviewed?: Yes   Progress toward BP goal: Improved  Self-Management Support :   Personal Goals (by the next clinic visit) :     Personal A1C goal: 8  (12/10/2009)     Personal blood pressure goal: 140/90  (08/25/2009)     Personal LDL goal: 100  (08/25/2009)    Diabetes self-management support: Written self-care plan  (05/15/2010)    Diabetes self-management support not done because: Good outcomes  (04/13/2010)    Hypertension self-management support: Written self-care plan  (05/15/2010)    Hypertension self-management support not done because: Good outcomes  (04/13/2010)    Lipid self-management support: Written self-care plan  (05/15/2010)     Lipid self-management support not done because: Good outcomes  (04/21/2010)

## 2011-01-26 NOTE — Assessment & Plan Note (Signed)
Summary: F/U BP [facility]   Vital Signs:  Patient Profile:   65 Years Old Female Weight:      152.2 pounds Temp:     98.4 degrees F Pulse rate:   67 / minute BP sitting:   166 / 91               PCP:  Ahkeem Goede MD  Chief Complaint:  follow up HTN.  History of Present Illness: Chest pain: [name] [name] complains of a several month history of episodic "squeezing" substernal chest pain.  The episodes last about 10 minutes.  She is unsure of what brings them on or relieves them.  She is unsure if the pain radiates or if it is associated with shortness of breath, nausea, or diaphoresis.  The pain is a 10/10 at its worst. The episodes happen about 1-2 x per week.  Not associated with exertion, eating.    Headaches: [name] [name] reports having headaches every other night for about 2-3 months.  She states they start in the evening and radiate from the front of her head to the back, especially on the right side.  They are throbbing and a 9/10 pain at the worst.  She cannot sleep with them, and she has to sit up.  Ibuprofen will ease the pain for 1-2 hours.  They are not associated with nausea, vomiting, increased neurological deficits when compared to her baseline residual deficits following her CVA.  She does complain of blurry vision.  They feel like the headaches she experienced when she was diagnosed with meningioma in 05-17-1997.    Hypertension History:      She complains of headache, chest pain, and visual symptoms, but denies palpitations, dyspnea with exertion, orthopnea, PND, peripheral edema, neurologic problems, syncope, and side effects from treatment.  She notes no problems with any antihypertensive medication side effects.  Further comments include: Taking her HCTZ, diltiazem 30 mg three times a day, and enalapril.  Marland Kitchen        Positive major cardiovascular risk factors include female age 49 years old or older, hyperlipidemia, hypertension, and current tobacco user.  Negative major  cardiovascular risk factors include negative family history for ischemic heart disease.        Positive history for target organ damage include prior stroke (or TIA).        Past Medical History:    Reviewed history from 02/23/2007 and no changes required:       ?COPD, ?h/o fibromyalgia (dx`d in 05/17/96), brain tumor - meningioma removed 1998-[facility], H/o dysphagia--resolved with esoph dilation, hospitalized x 3 for asthma exacerbations, Hyperglycemia 2nd to steroid use--A1c 6.5 (12/2003), R CVA   Family History:    Reviewed history from 02/23/2007 and no changes required:       `diabetes in several family members`, Brother--IDDM, Father--unknown (was not involved with her), Mother--deceased 17-May-2004; CAD, Unsure about other family history  Social History:    1/2 ppd x >20 years, down to 2/day in 2/05; Lives with husband, 3 grandchildren in house; Denies alcohol, illicit drugs    Back to 1 ppd.  Brother passed away 02/17/07, sister had a stroke.  Stress with grandchildren.    Married   Risk Factors:  Tobacco use:  current    Cigarettes:  Yes -- 1 pack(s) per day    Counseled to quit/cut down tobacco use:  yes Drug use:  no HIV high-risk behavior:  no Alcohol use:  no Exercise:  no  Family History Risk Factors:  Family History of MI in females < 26 years old:  no    Family History of MI in males < 60 years old:  no    Physical Exam  General:     Well-developed,well-nourished,in no acute distress; alert,appropriate and cooperative throughout examination Head:     Normocephalic and atraumatic without obvious abnormalities. No apparent alopecia or balding. Eyes:     vision grossly intact, pupils equal, pupils round, pupils reactive to light, pupils react to accomodation, corneas and lenses clear, no injection, no optic disk abnormalities, no retinal abnormalitiies, no nystagmus, and normal cup-disc ratio.   Chest Wall:     No deformities, masses, or tenderness noted. Lungs:      Normal respiratory effort, chest expands symmetrically. Lungs are clear to auscultation, no crackles or wheezes. Heart:     Normal rate and regular rhythm. S1 and S2 normal without gallop, murmur, click, rub or other extra sounds. Pulses:     2+ distal pulses Extremities:     No clubbing, cyanosis, edema, or deformity noted with normal full range of motion of all joints.   Neurologic:     alert & oriented X3, cranial nerves II-XII intact, sensation intact to light touch, sensation intact to pinprick, gait normal, DTRs symmetrical and normal, finger-to-nose normal, Romberg negative, RUE weakness, and RLE weakness (at baseline from prior CVA).    Impression & Recommendations:  Problem # 1:  HYPERTENSION, BENIGN SYSTEMIC (ICD-401.1) Assessment: Unchanged Blood pressure above goal of 130/80.  Patient reports taking her meds.  Do not want to use b-blocker given her severe asthma/COPD.  Finances preclude the use of norvasc.  Will increase diltiazem to 60 mg three times a day.  May need to consider hydralazine in the future.  Will see her back in one month for follow up. Orders: [facility]- Est  Level 4 (69629)   Problem # 2:  CHEST PAIN (ICD-786.50) Assessment: Deteriorated [name] [name]'s chest pain is worrisome for angina.  She had a cardiolyte and cath which were negative in 2003.  Will schedule her for repeat cardiolyte to rule out CAD.  Pain could also be related to stress and anxiety or esophageal stricture, will consider referring to GI doc if stress test negative. Orders: [facility]- Est  Level 4 ([id number])   Problem # 3:  HEADACHE, UNSPECIFIED (ICD-784.0) Assessment: Deteriorated [name] [name] headache is most likely tension related, but given her history of meningioma and pattern of headaches at night, will CT scan head to rule out intracranial lesions.  Unlikely given normal neuro exam, but exam difficult due to residual weakness from CVA.  Recommend ibuprofen for the pain. Orders: [facility]- Est  Level 4  (32440) CT (CT)   Problem # 4:  HYPERCHOLESTEROLEMIA (ICD-272.0) Assessment: Comment Only Patient not taking zocor as prescribed secondary to cost.  Will prescribe pravachol 20 and recheck LDL in 3 months along with LFTs.  Will likely need to increase to 40 daily given need for LDL reduction from 143 to less than 100.  Problem # 5:  GLUCOSE INTOLERANCE (ICD-271.3) Assessment: Comment Only will check [facility]. Orders: Hgb-A1C-[facility] (10272)   Hypertension Assessment/Plan:      The patient's hypertensive risk group is category C: Target organ damage and/or diabetes.  Today's blood pressure is 166/91.  Her blood pressure goal is < 130/80.   Patient Instructions: 1)  Please schedule a follow-up appointment in 1 month. 2)  Increase diltiazem to 60 mg three times daily. 3)  Start pravachol 20 mg nightly. 4)  Have CT scan done. 5)  Continue tylenol and ibuprofen for headaches 6)  Tobacco is very bad for your health and your loved ones! You Should stop smoking!. 7)  Stop Smoking Tips: Choose a Quit date. Cut down before the Quit date. decide what you will do as a substitute when you feel the urge to smoke(gum,toothpick,exercise). 8)  See you cardiologist for your stress test. Appt. with Dr. Algie Coffer for stress test consult on 03/30/07 @ 10:30 am ...................................................................Altamese Dilling CMA,  March 09, 2007 10:13 AM  Laboratory Results   Blood Tests   Date/Time Recieved: March 09, 2007 10:31 AM  Date/Time Reported: March 09, 2007 10:49 AM   HGBA1C: 6.4%   (Normal Range: Non-Diabetic - 3-6%   Control Diabetic - 6-8%)

## 2011-01-26 NOTE — Assessment & Plan Note (Signed)
Summary: discuss A1C/patient summary   Vital Signs:  Patient profile:   65 year old female Height:      61.5 inches Weight:      150 pounds BMI:     27.98 Temp:     98.2 degrees F oral Pulse rate:   80 / minute Pulse rhythm:   regular BP sitting:   134 / 79  (right arm)  Vitals Entered By: Modesta Messing LPN (June 11, 2009 1:35 PM) CC: Discuss lab results/patient summary Is Patient Diabetic? No Pain Assessment Patient in pain? no        Primary Care Provider:  Levander Campion MD  CC:  Discuss lab results/patient summary.  History of Present Illness: Patient presents to discuss the following:    1.  New onset type 2 diabetes: At last visit,  noted 2 months of blurry vision, fatigue, and thirst.  Has history of glucose intolerance per chart.  Checked A1C, and was 7.5.  Returns today to discuss diagnosis.  Reports she was diagnosed with diabetes in late 90s, and took a med for several years, then didn't need it anymore.  This diagnosis for some reason has dropped off her list.  Denies foot sores or numbness.  Needs eye MD appointment.  Knows how to check blood sugar.     2. tobacco:  continues to smoke despite PAD.  Stress is main trigger.  Trying to quit with nicotine gum  Allergies: 1)  ! Aspirin  Past History:  Past Medical History:  brain tumor - meningioma removed 1998-[facility], H/o dysphagia--resolved with esoph dilation  R CVA s/p meningioma removal Current Problems:  DIABETES MELLITUS, TYPE II (ICD-250.00) LEUKEMIA, LYMPHOCYTIC, CHRONIC (ICD-204.10) LOW BACK PAIN SYNDROME, SEVERE (ICD-724.2) ADJUSTMENT DISORDER WITH DEPRESSED MOOD (ICD-309.0) ROUTINE GYNECOLOGICAL EXAMINATION (ICD-V72.31) SPECIAL SCREENING FOR MALIGNANT NEOPLASMS COLON (ICD-V76.51) SCREENING FOR MALIGNANT NEOPLASM OF THE CERVIX (ICD-V76.2) PERIPHERAL VASCULAR DISEASE (ICD-443.9) TOBACCO DEPENDENCE (ICD-305.1) HYPERTENSION, BENIGN SYSTEMIC (ICD-401.1) HYPERCHOLESTEROLEMIA  (ICD-272.0) GASTROESOPHAGEAL REFLUX, NO ESOPHAGITIS (ICD-530.81) COPD (ICD-496) ANXIETY (ICD-300.00)  Past Surgical History:  Cardiac Cath: normal coronary arteries - 02/24/2002 Cholecystectomy - 03/27/2002  EGD: esophageal dilation (Dr. Madilyn Fireman); esophagus was diffusely dilated distally w/ abrupt taper into LES-?achalasia - 01/28/2004  Spirometry: mod obstr dz w/ mod improvement post-bronchodilator - 03/27/2004  Family History: Reviewed history from 02/23/2007 and no changes required. `diabetes in several family members`, Brother--IDDM, Father--unknown (was not involved with her), Mother--deceased June 01, 2004; CAD, Unsure about other family history  Social History: Reviewed history from 03/09/2007 and no changes required. 1/2 ppd x >20 years, down to 2/day in 2/05; Lives with husband, 3 grandchildren in house; Denies alcohol, illicit drugs Back to 1 ppd.  Brother passed away 2007-03-04, sister had a stroke.  Stress with grandchildren. Married  Physical Exam  General:  alert, pleasant, NAD, gets on exam table with ease   Impression & Recommendations:  Problem # 1:  DIABETES MELLITUS, TYPE II (ICD-250.00) Assessment New discussed basic dietary changes and exercise.  Will start metformin.  Discussed possible side effects.  Gave log for blood sugars.  Patient willing to go to DM education center.  referral made.  Referral made for eye MD.  will follow up in one month with blood sugar log after starting metformin Her updated medication list for this problem includes:    Enalapril Maleate 20 Mg Tabs (Enalapril maleate) .Marland Kitchen... Take 1 tablet by mouth once a day    Metformin Hcl 500 Mg Tabs (Metformin hcl) .Marland Kitchen... 1 by mouth daily x 5 days, then  1 by mouth two times a day  Orders: Ophthalmology Referral (Ophthalmology) Diabetic Clinic Referral (Diabetic) [facility]- Est  Level 4 ([address]213)  Problem # 2:  LEUKEMIA, LYMPHOCYTIC, CHRONIC (ICD-204.10) Assessment: Comment Only patient had mammogram in fall 2009, found  lymph node, biopsied, and found to be consistent with CLL.  Sees Dr. Arline Asp with heme/onc.  They are following her labs for right now.    Problem # 3:  COPD (ICD-496) will have occasional exacerbation. continues to smoke but wants to stop with gum Her updated medication list for this problem includes:    Advair Diskus 500-50 Mcg/dose Misc (Fluticasone-salmeterol) ..... Inhale 1 puff as directed twice a day    Albuterol 90 Mcg/act Aers (Albuterol) ..... Inhale 2 puff as directed four times a day    Singulair 10 Mg Tabs (Montelukast sodium) .Marland Kitchen... Take 1 tablet by mouth at bedtime  Problem # 4:  LOW BACK PAIN SYNDROME, SEVERE (ICD-724.2) had recent flare of this, flexeril helping a lot Her updated medication list for this problem includes:    Ultram 50 Mg Tabs (Tramadol hcl) .Marland Kitchen... 2 tabs three times a day as needed pain    Flexeril 10 Mg Tabs (Cyclobenzaprine hcl) ..... One three times a day prn  Problem # 5:  ADJUSTMENT DISORDER WITH DEPRESSED MOOD (ICD-309.0) very stressful home situation--raising grandkids, husband not supportive, daughter in and out of children's lives, former drug abuser.  Denies need for medication.  getting counseling at cancer center  Problem # 6:  PERIPHERAL VASCULAR DISEASE (ICD-443.9) severe.  has had revascularization surgery. Her updated medication list for this problem includes:    Plavix 75 Mg Tabs (Clopidogrel bisulfate) .Marland Kitchen... 1 by mouth daily  Problem # 7:  HYPERTENSION, BENIGN SYSTEMIC (ICD-401.1) was always very poorly controlled, now patient reports she was not taking her meds as directed.  now at goal with current meds taken correctly Her updated medication list for this problem includes:    Enalapril Maleate 20 Mg Tabs (Enalapril maleate) .Marland Kitchen... Take 1 tablet by mouth once a day    Hydrochlorothiazide 25 Mg Tabs (Hydrochlorothiazide) .Marland Kitchen... Take 1 tablet by mouth every morning    Coreg 6.25 Mg Tabs (Carvedilol) ..... One tab by mouth bid    Norvasc 10  Mg Tabs (Amlodipine besylate) .Marland Kitchen... 1 by mouth daily  Problem # 8:  HYPERCHOLESTEROLEMIA (ICD-272.0) due for FLP.  Last direct LDLs at goal (67) Her updated medication list for this problem includes:    Zocor 80 Mg Tabs (Simvastatin) .Marland Kitchen... 1 by mouth nightly  Complete Medication List: 1)  Advair Diskus 500-50 Mcg/dose Misc (Fluticasone-salmeterol) .... Inhale 1 puff as directed twice a day 2)  Albuterol 90 Mcg/act Aers (Albuterol) .... Inhale 2 puff as directed four times a day 3)  Enalapril Maleate 20 Mg Tabs (Enalapril maleate) .... Take 1 tablet by mouth once a day 4)  Flonase 50 Mcg/act Susp (Fluticasone propionate) .... Spray 1 spray into both nostrils once a day 5)  Hydrochlorothiazide 25 Mg Tabs (Hydrochlorothiazide) .... Take 1 tablet by mouth every morning 6)  Singulair 10 Mg Tabs (Montelukast sodium) .... Take 1 tablet by mouth at bedtime 7)  Coreg 6.25 Mg Tabs (Carvedilol) .... One tab by mouth bid 8)  Ultram 50 Mg Tabs (Tramadol hcl) .... 2 tabs three times a day as needed pain 9)  Flexeril 10 Mg Tabs (Cyclobenzaprine hcl) .... One three times a day prn 10)  Zocor 80 Mg Tabs (Simvastatin) .Marland Kitchen.. 1 by mouth nightly 11)  Plavix  75 Mg Tabs (Clopidogrel bisulfate) .Marland Kitchen.. 1 by mouth daily 12)  Norvasc 10 Mg Tabs (Amlodipine besylate) .Marland Kitchen.. 1 by mouth daily 13)  Cvs Nicotine Polacrilex 2 Mg Gum (Nicotine polacrilex) .Marland Kitchen.. 1 piece every 4 hours as needed for cigarette cravings disp: qs x one month 14)  Metformin Hcl 500 Mg Tabs (Metformin hcl) .Marland Kitchen.. 1 by mouth daily x 5 days, then 1 by mouth two times a day 15)  Blood Glucose Meter Kit (Blood glucose monitoring suppl) .... Meter and supplies check sugar as directed  Patient Instructions: 1)  Please schedule a follow-up appointment in 1 month with Dr. Sharen Hones for your blood sugars. 2)  Start the medication metformin 500 mg once daily x 5 days, then increase to twice daily. 3)  You may have some diarrhea or stomach discomfort at first, this  usually goes away with time. 4)  Check your blood sugar some days in the morning before eating or drinking, and some days 2 hours after a meal, and record it in your log book. 5)  We will make the referral to the diabetes education and nutrition management center and the eye doctor. Prescriptions: BLOOD GLUCOSE METER  KIT (BLOOD GLUCOSE MONITORING SUPPL) meter and supplies check sugar as directed  #1 x 0   Entered and Authorized by:   Levander Campion MD   Signed by:   Levander Campion MD on 06/11/2009   Method used:   Electronically to        [facility] [phone]* (retail)       [address] [place]  [address]       Ph: [phone] or [phone]       Fax: [phone]   RxID:   [id number] NORVASC 10 MG TABS (AMLODIPINE BESYLATE) 1 by mouth daily  #30 x 6   Entered and Authorized by:   Levander Campion MD   Signed by:   Levander Campion MD on 06/11/2009   Method used:   Electronically to        [facility] [phone]* (retail)       [address] [place]  [address]       Ph: [phone] or [phone]       Fax: [phone]   RxID:   [id number] FLEXERIL 10 MG TABS (CYCLOBENZAPRINE HCL) one three times a day prn  #90 x 0   Entered and Authorized by:   Levander Campion MD   Signed by:   Levander Campion MD on 06/11/2009   Method used:   Electronically to        [facility] [phone]* (retail)       [address], [place]  [address]       Ph: [phone] or [phone]       Fax: [phone]   RxID:   [id number] SINGULAIR 10 MG TABS (MONTELUKAST SODIUM) Take 1 tablet by mouth at bedtime  #31 x 11   Entered and Authorized by:   Levander Campion MD   Signed by:   Levander Campion MD on 06/11/2009   Method used:   Electronically to        [facility] [phone]* (retail)       [address]       [place], [place]  [address]  Ph: [phone] or [phone]       Fax: [phone]   RxID:   [id number] METFORMIN HCL  500 MG TABS (METFORMIN HCL) 1 by mouth daily x 5 days, then 1 by mouth two times a day  #60 x 3   Entered and Authorized by:   Levander Campion MD   Signed by:   Levander Campion MD on 06/11/2009   Method used:   Electronically to        [facility] [phone]* (retail)       [address]       [place], [place]  [address]       Ph: [phone] or [phone]       Fax: [phone]   RxID:   [id number]

## 2011-01-26 NOTE — Miscellaneous (Signed)
Summary: CARDIOLOGY FU APPT  CARDIOLOGY APPT WITH DR Algie Coffer ON: THURS 10/19/07 @ 2PM PHONE NUMBER IS: [phone] [address] PT MAILED APPT NOTES FAXED TO: [phone]

## 2011-01-26 NOTE — Assessment & Plan Note (Signed)
Summary: f/u DM   Vital Signs:  Patient profile:   65 year old female Height:      61.5 inches Weight:      148.7 pounds BMI:     27.74 Pulse rate:   71 / minute BP sitting:   150 / 84  (right arm)  Vitals Entered By: Arlyss Repress CMA, (November 06, 2009 3:28 PM) CC: f/up HTN per Dr.Shetara Launer. request handicap placard. Is Patient Diabetic? Yes Pain Assessment Patient in pain? no        Primary Care Provider:  Eustaquio Boyden  MD  CC:  f/up HTN per Dr.Torez Beauregard. request handicap placard..  History of Present Illness: CC: f/u DM and left leg pain and handicap placard  1. HTN - 110-150 systolic.  Complaint with meds.  No chest pain.  2. DM - 110 - 150 cbgs.  states has been having diarrhea from metformin so stops for days at a time.  hasn't set up eye doctor, states has appt with DM education center this month.  A1c 6.8 08/2009.  3. HLD - on zocor.  LDL 136 last check.  4. smoking - 1/2 ppd to 1 ppd.  States still interested in cessation classes.  5. left leg pain - sharp pain in posterior left leg, started a few weeks ago.  denies inciting factors/injury or trauma.  denies swelling or erythema of leg.  Pain extends down leg, doesn't start in back.  no numbness or incontinence.  6. Handicap placard - states previous doctor filled out every 6 mo.  states needs because has muscle cramps with walking long distances.  has walker at home which she doesn't use.  Habits & Providers  Alcohol-Tobacco-Diet     Tobacco Status: current     Tobacco Counseling: to quit use of tobacco products  Current Medications (verified): 1)  Advair Diskus 500-50 Mcg/dose Misc (Fluticasone-Salmeterol) .... Inhale 1 Puff As Directed Twice A Day 2)  Albuterol 90 Mcg/act Aers (Albuterol) .... Inhale 2 Puff As Directed Four Times A Day 3)  Enalapril Maleate 20 Mg Tabs (Enalapril Maleate) .... Take 1 Tablet By Mouth Once A Day 4)  Flonase 50 Mcg/act Susp (Fluticasone Propionate) .... Spray 1 Spray  Into Both Nostrils Once A Day 5)  Singulair 10 Mg Tabs (Montelukast Sodium) .... Take 1 Tablet By Mouth At Bedtime 6)  Coreg 6.25 Mg  Tabs (Carvedilol) .... One Tab By Mouth Bid 7)  Ultram 50 Mg  Tabs (Tramadol Hcl) .... 2 Tabs Three Times A Day As Needed Pain 8)  Flexeril 10 Mg Tabs (Cyclobenzaprine Hcl) .... One Three Times A Day Prn 9)  Zocor 80 Mg  Tabs (Simvastatin) .Marland Kitchen.. 1 By Mouth Nightly 10)  Plavix 75 Mg  Tabs (Clopidogrel Bisulfate) .Marland Kitchen.. 1 By Mouth Daily 11)  Norvasc 10 Mg Tabs (Amlodipine Besylate) .Marland Kitchen.. 1 By Mouth Daily 12)  Cvs Nicotine Polacrilex 2 Mg Gum (Nicotine Polacrilex) .Marland Kitchen.. 1 Piece Every 4 Hours As Needed For Cigarette Cravings Disp: Qs X One Month 13)  Metformin Hcl 500 Mg Tabs (Metformin Hcl) .Marland Kitchen.. 1 By Mouth Two Times A Day 14)  Onetouch Ultrasoft Lancets  Misc (Lancets) .... Use As Directed 15)  Onetouch Ultra Test  Strp (Glucose Blood) .... Use As Directed, Qs 1 Month  Allergies (verified): 1)  ! Aspirin  Past History:  Past medical, surgical, family and social histories (including risk factors) reviewed for relevance to current acute and chronic problems.  Past Medical History: Reviewed history from 07/02/2009 and no  changes required.  brain tumor - meningioma removed 1998-[facility], H/o dysphagia--resolved with esoph dilation  R CVA s/p meningioma removal Current Problems:  DIABETES MELLITUS, TYPE II (ICD-250.00) --> [facility] LEUKEMIA, LYMPHOCYTIC, CHRONIC (ICD-204.10) LOW BACK PAIN SYNDROME, SEVERE (ICD-724.2) ADJUSTMENT DISORDER WITH DEPRESSED MOOD (ICD-309.0) ROUTINE GYNECOLOGICAL EXAMINATION (ICD-V72.31) SPECIAL SCREENING FOR MALIGNANT NEOPLASMS COLON (ICD-V76.51) SCREENING FOR MALIGNANT NEOPLASM OF THE CERVIX (ICD-V76.2) PERIPHERAL VASCULAR DISEASE (ICD-443.9) TOBACCO DEPENDENCE (ICD-305.1) HYPERTENSION, BENIGN SYSTEMIC (ICD-401.1) HYPERCHOLESTEROLEMIA (ICD-272.0) GASTROESOPHAGEAL REFLUX, NO  ESOPHAGITIS (ICD-530.81) COPD (ICD-496) ANXIETY (ICD-300.00)  Past Surgical History: Reviewed history from 07/22/2009 and no changes required. Cardiac Cath: normal coronary arteries 02/24/2002 Cholecystectomy - 03/27/2002 EGD: esophageal dilation (Dr. Madilyn Fireman); esophagus was diffusely dilated distally w/ abrupt taper into LES-?achalasia - 01/28/2004 Spirometry: mod obstr dz w/ mod improvement post-bronchodilator - 03/27/2004 Bypass grafting of RLE for PAD claudication - SFA patent with popliteal stent in place (12/28/2006) Cardiac Cath: normal CA 04/05/2007  Family History: Reviewed history from 02/23/2007 and no changes required. `diabetes in several family members`, Brother--IDDM, Father--unknown (was not involved with her), Mother--deceased 05/12/04; CAD, Unsure about other family history  Social History: Reviewed history from 09/25/2009 and no changes required. 1 pack every 3 days.  h/o quitting in past.  Lives with husband, 3 grandchildren in house; Denies alcohol, illicit drugs Brother passed away 12-Feb-2007, sister had a stroke.  Stress with grandchildren. Married  Physical Exam  General:  Alert, sitting upright and active appearing Lungs:  Normal respiratory effort, chest expands symmetrically. Lungs are clear to auscultation, no crackles or wheezes. Heart:  normal S1, S2, no m/r/g Msk:  tender to palpation left hamstring.  pain wiht stretching those muscles.    Impression & Recommendations:  Problem # 1:  HYPERTENSION, BENIGN SYSTEMIC (ICD-401.1)  elevated today.  states at home run 110-140 systolic. compliant with meds. Her updated medication list for this problem includes:    Enalapril Maleate 20 Mg Tabs (Enalapril maleate) .Marland Kitchen... Take 1 tablet by mouth once a day    Coreg 6.25 Mg Tabs (Carvedilol) ..... One tab by mouth bid    Norvasc 10 Mg Tabs (Amlodipine besylate) .Marland Kitchen... 1 by mouth daily  BP today: 150/84 Prior BP: 131/72 (09/25/2009)  Prior 10 Yr Risk Heart Disease: > 32 %  (08/25/2009)  Labs Reviewed: K+: 3.3 (07/11/2009) Creat: : 1.03 (06/16/2009)   Chol: 208 (07/22/2009)   HDL: 34 (07/22/2009)   LDL: 136 (07/22/2009)   TG: 191 (07/22/2009)  Orders: [facility]- Est  Level 4 ([address])  Problem # 2:  HYPERCHOLESTEROLEMIA (ICD-272.0)  Her updated medication list for this problem includes:    Zocor 80 Mg Tabs (Simvastatin) .Marland Kitchen... 1 by mouth nightly  Labs Reviewed: SGOT: 13 (06/16/2009)   SGPT: 10 (06/16/2009)  Lipid Goals: Chol Goal: 200 (08/25/2009)   HDL Goal: 40 (08/25/2009)   LDL Goal: 70 (08/25/2009)   TG Goal: 150 (08/25/2009)  Prior 10 Yr Risk Heart Disease: > 32 % (08/25/2009)   HDL:34 (07/22/2009), 36 (06/12/2008)  LDL:136 (07/22/2009), 126 (06/12/2008)  Chol:208 (07/22/2009), 238 (06/12/2008)  Trig:191 (07/22/2009), 379 (06/12/2008)  Orders: [facility]- Est  Level 4 ([address])  Problem # 3:  DIABETES MELLITUS, TYPE II (ICD-250.00) due for blood work next visit (A1c and BMP).  Stable but not taking metformin consistently.  discussed importance of this and that hopefully metformin side fx of diarrhea should let up after consistent use x 2 wks of drug. The following medications were removed from the medication list:  Ecotrin Low Strength 81 Mg Tbec (Aspirin) .Marland Kitchen... Take one daily Her updated medication list for this problem includes:    Enalapril Maleate 20 Mg Tabs (Enalapril maleate) .Marland Kitchen... Take 1 tablet by mouth once a day    Metformin Hcl 500 Mg Tabs (Metformin hcl) .Marland Kitchen... 1 by mouth two times a day  Orders: [facility]- Est  Level 4 ([address])  Labs Reviewed: Creat: 1.03 (06/16/2009)    Reviewed HgBA1c results: 6.8 (09/25/2009)  7.5 (06/02/2009)  Problem # 4:  TOBACCO DEPENDENCE (ICD-305.1)  Her updated medication list for this problem includes:    Cvs Nicotine Polacrilex 2 Mg Gum (Nicotine polacrilex) .Marland Kitchen... 1 piece every 4 hours as needed for cigarette cravings disp: qs x one month  Orders: [facility]- Est  Level 4 ([address])  Encouraged smoking cessation and  discussed different methods for smoking cessation.  again provided with informatio nto attend group classes.  Problem # 5:  MUSCLE STRAIN, HAMSTRING MUSCLE (ICD-844.8) ice to area as well as NSAID.  after 1-2 wks, start stretching hamstrings,  exercises demonstrated.  RTC 2-4 wks for f/u. sooner if worsening.  Complete Medication List: 1)  Advair Diskus 500-50 Mcg/dose Misc (Fluticasone-salmeterol) .... Inhale 1 puff as directed twice a day 2)  Albuterol 90 Mcg/act Aers (Albuterol) .... Inhale 2 puff as directed four times a day 3)  Enalapril Maleate 20 Mg Tabs (Enalapril maleate) .... Take 1 tablet by mouth once a day 4)  Flonase 50 Mcg/act Susp (Fluticasone propionate) .... Spray 1 spray into both nostrils once a day 5)  Singulair 10 Mg Tabs (Montelukast sodium) .... Take 1 tablet by mouth at bedtime 6)  Coreg 6.25 Mg Tabs (Carvedilol) .... One tab by mouth bid 7)  Ultram 50 Mg Tabs (Tramadol hcl) .... 2 tabs three times a day as needed pain 8)  Flexeril 10 Mg Tabs (Cyclobenzaprine hcl) .... One three times a day prn 9)  Zocor 80 Mg Tabs (Simvastatin) .Marland Kitchen.. 1 by mouth nightly 10)  Plavix 75 Mg Tabs (Clopidogrel bisulfate) .Marland Kitchen.. 1 by mouth daily 11)  Norvasc 10 Mg Tabs (Amlodipine besylate) .Marland Kitchen.. 1 by mouth daily 12)  Cvs Nicotine Polacrilex 2 Mg Gum (Nicotine polacrilex) .Marland Kitchen.. 1 piece every 4 hours as needed for cigarette cravings disp: qs x one month 13)  Metformin Hcl 500 Mg Tabs (Metformin hcl) .Marland Kitchen.. 1 by mouth two times a day 14)  Onetouch Ultrasoft Lancets Misc (Lancets) .... Use as directed 15)  Onetouch Ultra Test Strp (Glucose blood) .... Use as directed, qs 1 month  Patient Instructions: 1)  Consider attending our free stop smoking classes.  Session 1 the third Tuesday and Session 2 the fourth Tuesday of each month. Both at 4 PM at the [facility]. Please call [phone] to register or if you have questions.  2)  Please return in 2-3 months for follow up of  diabetes/hypertension.  bring handicap request form to see if you qualify for placard.   3)  Complete the stool cards!! 4)  I'm glad you have [facility] with [facility] - keep appointment. 5)  Call for another eye doctor appointment. 6)  remember - Metformin twice daily every day. 7)  Call clinic with questions.   Prevention & Chronic Care Immunizations   Influenza vaccine: Fluvax Non-MCR  (09/25/2009)   Influenza vaccine deferral: Deferred  (08/25/2009)   Influenza vaccine due: 10/01/2009    Tetanus booster: 09/26/2002: Done.   Tetanus booster due: 09/26/2012    Pneumococcal  vaccine: Done.  (10/27/2001)   Pneumococcal vaccine due: None    H. zoster vaccine: Not documented  Colorectal Screening   Hemoccult: Done.  (02/24/1997)   Hemoccult action/deferral: Ordered  (09/25/2009)   Hemoccult due: 02/24/1998    Colonoscopy: Not documented   Colonoscopy action/deferral: Deferred  (07/22/2009)  Other Screening   Pap smear: normal  (10/01/2008)   Pap smear due: 09/2011    Mammogram: BI-RADS CATEGORY 4:  Suspicious abnormality - biopsy should be^MM DIGITAL DIAG LTD R  (10/22/2008)   Mammogram due: 01/27/2005    DXA bone density scan: Not documented   Smoking status: current  (11/06/2009)   Smoking cessation counseling: yes  (12/05/2008)  Diabetes Mellitus   HgbA1C: 6.8  (09/25/2009)   Hemoglobin A1C due: 06/09/2007    Eye exam: Not documented   Diabetic eye exam action/deferral: Ophthalmology referral  (07/22/2009)    Foot exam: yes  (07/22/2009)   Foot exam action/deferral: Deferred   High risk foot: Not documented   Foot care education: Done  (07/22/2009)   Foot exam due: 11/25/2009    Urine microalbumin/creatinine ratio: Not documented    Diabetes flowsheet reviewed?: Yes   Progress toward A1C goal: Unchanged  Lipids   Total Cholesterol: 208  (07/22/2009)   LDL: 136  (07/22/2009)   LDL Direct: 67  (12/05/2008)   HDL: 34   (07/22/2009)   Triglycerides: 191  (07/22/2009)    SGOT (AST): 13  (06/16/2009)   SGPT (ALT): 10  (06/16/2009)   Alkaline phosphatase: 127  (07/11/2009)   Total bilirubin: 0.3  (06/16/2009)    Lipid flowsheet reviewed?: Yes   Progress toward LDL goal: Unchanged  Hypertension   Last Blood Pressure: 150 / 84  (11/06/2009)   Serum creatinine: 1.03  (06/16/2009)   Serum potassium 3.3  (07/11/2009)   Basic metabolic panel due: 09/16/2009    Hypertension flowsheet reviewed?: Yes   Progress toward BP goal: Unchanged  Self-Management Support :   Personal Goals (by the next clinic visit) :     Personal A1C goal: 7  (08/25/2009)     Personal blood pressure goal: 140/90  (08/25/2009)     Personal LDL goal: 100  (08/25/2009)    Diabetes self-management support: Copy of home glucose meter record, CBG self-monitoring log, Written self-care plan, Referred for DM self-management training, Referred for medical nutrition therapy  (11/06/2009)   Diabetes care plan printed    Hypertension self-management support: BP self-monitoring log, Written self-care plan, Referred for medical nutrition therapy  (11/06/2009)   Hypertension self-care plan printed.    Hypertension self-management support not done because: Good outcomes  (09/25/2009)    Lipid self-management support: Written self-care plan, Referred for self-management class, Referred for medical nutrition therapy  (11/06/2009)   Lipid self-care plan printed.

## 2011-01-26 NOTE — Assessment & Plan Note (Signed)
Summary: 2 wk f/u/eo   Vital Signs:  Patient profile:   65 year old female Height:      61.5 inches Weight:      152.19 pounds Temp:     97.8 degrees F oral Pulse rate:   70 / minute BP sitting:   127 / 81  (left arm)  Vitals Entered By: deseree blount/sma  CC: follow up cancer center and blood pressure Is Patient Diabetic? Yes  Pain Assessment Patient in pain? no        History of Present Illness: Patient presents to discuss the following:  1. Lymphoma: In November patient had a mass found on routine mammogram and underwent biopsy that revealed lymphoma.  She was referred to cancer center but was unable to keep appointment due to finances. She was rescheduled 2 weeks ago and went yesterday.  They did blood work including a peripheral smear and are planning to get CT scans to further investigate lymphoma, as well as a repeat mammogram.  Oncologist is Dr. Arline Asp.  She is joining a support group at the cancer center.  2. HTN:  Blood pressure has been seemingly difficult to control, but for past two visits has been at or close to goal.  She admits she is taking her medications without problems now because her daughter comes over and helps her everyday.  Before, she would forget to take her meds or take them incorrectly. Denies CP, SOB, edema.    3. Hypokalemia:  K+ noted to be 3.3 on yesterday's labwork  4. tobacco abuse:  trying to quit.  Big obstacle is husband smoking.  Unwilling to smoke outside or try to quit.  Patient interested in trying nicotine gum  Habits & Providers     Tobacco Status: current  Allergies: 1)  ! Aspirin  Physical Exam  General:  anxious appearing, tearful, but improved from last visit. daughter here with her Lungs:  normal respiratory effort, normal breath sounds, no fremitus, no crackles, and no wheezes.   Heart:  normal rate, regular rhythm, no murmur, no gallop, and no rub.     Impression & Recommendations:  Problem # 1:  LYMPHOMA  (ICD-202.80) Assessment Comment Only  will continue to follow progress at cancer center.  Orders: [facility]- Est  Level 4 (99214)  Problem # 2:  ADJUSTMENT DISORDER WITH DEPRESSED MOOD (ICD-309.0) Assessment: Improved  slightly improved with daughter's support and assistance at home  Orders: [facility]- Est  Level 4 (65784)  Problem # 3:  HYPERTENSION, BENIGN SYSTEMIC (ICD-401.1) Assessment: Improved  needs assistance taking her meds at home.  Continue current regimen Her updated medication list for this problem includes:    Enalapril Maleate 20 Mg Tabs (Enalapril maleate) .Marland Kitchen... Take 1 tablet by mouth once a day    Hydrochlorothiazide 25 Mg Tabs (Hydrochlorothiazide) .Marland Kitchen... Take 1 tablet by mouth every morning    Coreg 6.25 Mg Tabs (Carvedilol) ..... One tab by mouth bid    Norvasc 10 Mg Tabs (Amlodipine besylate) .Marland Kitchen... 1 by mouth daily  Orders: [facility]- Est  Level 4 (69629)  Problem # 4:  TOBACCO DEPENDENCE (ICD-305.1) Assessment: Improved  actively trying to quit.  Will try nicotine gum Her updated medication list for this problem includes:    [facility] Nicotine Polacrilex 2 Mg Gum (Nicotine polacrilex) .Marland Kitchen... 1 piece every 4 hours as needed for cigarette cravings disp: qs x one month  Orders: [facility]- Est  Level 4 (52841)  Problem # 5:  HYPOKALEMIA (ICD-276.8) Assessment: New  unclear  etiology. Not on any meds that reduce K+.  Will repeat and recheck  Orders: [facility]- Est  Level 4 (09811)  Complete Medication List: 1)  Advair Diskus 500-50 Mcg/dose Misc (Fluticasone-salmeterol) .... Inhale 1 puff as directed twice a day 2)  Albuterol 90 Mcg/act Aers (Albuterol) .... Inhale 2 puff as directed four times a day 3)  Enalapril Maleate 20 Mg Tabs (Enalapril maleate) .... Take 1 tablet by mouth once a day 4)  Flonase 50 Mcg/act Susp (Fluticasone propionate) .... Spray 1 spray into both nostrils once a day 5)  Hydrochlorothiazide 25 Mg Tabs (Hydrochlorothiazide) .... Take 1 tablet by mouth every  morning 6)  Singulair 10 Mg Tabs (Montelukast sodium) .... Take 1 tablet by mouth at bedtime 7)  Coreg 6.25 Mg Tabs (Carvedilol) .... One tab by mouth bid 8)  Ultram 50 Mg Tabs (Tramadol hcl) .... 2 tabs three times a day as needed pain 9)  Flexeril 10 Mg Tabs (Cyclobenzaprine hcl) .... One three times a day prn 10)  Zocor 80 Mg Tabs (Simvastatin) .Marland Kitchen.. 1 by mouth nightly 11)  Plavix 75 Mg Tabs (Clopidogrel bisulfate) .Marland Kitchen.. 1 by mouth daily 12)  Norvasc 10 Mg Tabs (Amlodipine besylate) .Marland Kitchen.. 1 by mouth daily 13)  Potassium Chloride Crys Cr 20 Meq Cr-tabs (Potassium chloride crys cr) .... 2 by mouth x one dose 14)  [facility]vs Nicotine Polacrilex 2 Mg Gum (Nicotine polacrilex) .Marland Kitchen.. 1 piece every 4 hours as needed for cigarette cravings disp: qs x one month  Patient Instructions: 1)  Please schedule a follow-up appointment in 1 month.  2)  Take potassium 40 mEq x one dose, then return for lab check at the end of this week. 3)  Use nicorette gum to help with stopping smoking--that is so wonderful that you are doing it! 4)  We will get in touch with you regarding personal care services--it seems the main thing needed is help with medications. Prescriptions: [facility] NICOTINE POLACRILEX 2 MG GUM (NICOTINE POLACRILEX) 1 piece every 4 hours as needed for cigarette cravings disp: QS x one month  #1 x 3   Entered and Authorized by:   Levander Campion MD   Signed by:   Levander Campion MD on 04/29/2009   Method used:   Electronically to        [facility] [phone]* (retail)       [address], [place]  [address]       Ph: [phone] or [phone]       Fax: [phone]   RxID:   [id number] POTASSIUM CHLORIDE CRYS CR 20 MEQ CR-TABS (POTASSIUM CHLORIDE CRYS CR) 2 by mouth x one dose  #2 x 0   Entered and Authorized by:   Levander Campion MD   Signed by:   Levander Campion MD on 04/29/2009   Method used:   Electronically to        [facility] [phone]* (retail)       [address]        [place], [place]  [address]       Ph: [phone] or [phone]       Fax: [phone]   RxID:   [id number]

## 2011-01-26 NOTE — Assessment & Plan Note (Signed)
Summary: Smoking Cessation Group   Primary Care Provider:  Eustaquio Boyden  MD   History of Present Illness: [name] [name] presented for a free Introduction to Smoking Cessation class.  She reported she has made several quit attempts including one of at least a month duration.  She is currently smoking about a 1/2 pack a day.  Major barriers to quitting include family members smoking in the home and a large amount of family stress including arguments and hostility / tension between her and her husband.  She says that when away from the house, she does not typically have trouble staying away from cigarettes.  Her husband has been interested in quitting in the past but not currently.  He does not respect requests to smoke outside of the house or even in a different room.  Ms. Kerekes reported a 6 on how important this change is to make however I think she did not understand the question well.  Her dependency on nicotine from a physiological standpoint is assessed to be very low based on the number of cigarettes she smokes per day and her report of being able to go several days without a cigarette without difficulty when away from her home.  Barriers will be difficult to manage as she does not have significant control.  Would likely benefit from one-to-one intervention getting a better understanding of the relationship with her husband and how she could manage her reaction to him better.  Provided options to schedule follow-up with PCP, attend smoke cessation class next week with Dr. Raymondo Band, schedule smoking cessation follow-up with Dr. Raymondo Band and / or call the 1-800-QUITNOW line.    Her PCP may also consider referring her for therapy to deal with the family issues.  Allergies: 1)  ! Aspirin   Complete Medication List: 1)  Advair Diskus 500-50 Mcg/dose Misc (Fluticasone-salmeterol) .... Inhale 1 puff as directed twice a day 2)  Albuterol 90 Mcg/act Aers (Albuterol) .... Inhale 2 puff as directed  four times a day 3)  Enalapril Maleate 20 Mg Tabs (Enalapril maleate) .... Take 1 tablet by mouth once a day 4)  Flonase 50 Mcg/act Susp (Fluticasone propionate) .... Spray 1 spray into both nostrils once a day 5)  Singulair 10 Mg Tabs (Montelukast sodium) .... Take 1 tablet by mouth at bedtime 6)  Coreg 6.25 Mg Tabs (Carvedilol) .... One tab by mouth bid 7)  Ultram 50 Mg Tabs (Tramadol hcl) .... 2 tabs three times a day as needed pain 8)  Flexeril 10 Mg Tabs (Cyclobenzaprine hcl) .... One three times a day prn 9)  Zocor 80 Mg Tabs (Simvastatin) .Marland Kitchen.. 1 by mouth nightly 10)  Plavix 75 Mg Tabs (Clopidogrel bisulfate) .Marland Kitchen.. 1 by mouth daily 11)  Norvasc 10 Mg Tabs (Amlodipine besylate) .Marland Kitchen.. 1 by mouth daily 12)  Cvs Nicotine Polacrilex 2 Mg Gum (Nicotine polacrilex) .Marland Kitchen.. 1 piece every 4 hours as needed for cigarette cravings disp: qs x one month 13)  Metformin Hcl 500 Mg Tabs (Metformin hcl) .Marland Kitchen.. 1 by mouth two times a day 14)  Onetouch Ultrasoft Lancets Misc (Lancets) .... Use as directed 15)  Onetouch Ultra Test Strp (Glucose blood) .... Use as directed, qs 1 month  Other Orders: Smoking Cessation Class (Z6109)

## 2011-01-26 NOTE — Letter (Signed)
Summary: Generic Letter  [facility]  [address]   [place], [place] [address]   Phone: [phone]  Fax: [phone]    06/03/2009  SHAHRZAD KOBLE [address] [place], [place]  [address]  Dear Ms. Andrada,    Please call us and schedule an appointment with Dr.Hartman to discuss your blood work.       Sincerely,   Arlyss Repress CMA,

## 2011-01-26 NOTE — Assessment & Plan Note (Signed)
Summary: dizzy & weak,df   Vital Signs:  Patient profile:   65 year old female Height:      61.5 inches Weight:      150.56 pounds BMI:     28.09 Temp:     97.8 degrees F oral Pulse rate:   69 / minute Pulse rhythm:   regular BP sitting:   121 / 75  (right arm)  Vitals Entered By: Modesta Messing LPN (June 16, 2009 1:56 PM) CC: Dizzy and weak with decreased appetite for three days. Is Patient Diabetic? Yes  Pain Assessment Patient in pain? no        Primary Care Provider:  Levander Campion MD  CC:  Dizzy and weak with decreased appetite for three days.Marland Kitchen  History of Present Illness: Dizzy and weak for 2 days, did not go to church yesterday.  BP was low at 98/50 and daughter called the ER, she was told to increase fluids.  Has not been eating much, blood sugars 80-120 on her record.  No episodes of hypoglycemis.  Just wants to lay around and sleep all the time.  Daughter very worried and with her today. + nausea but no vomiting.  Denies diarrhea or blood in stool.  Intermittent sharp lower right abdomnal pain.  On multiple antihypertensives for history of poorly controlled HTN, recently started on metformin for new diagnosis of DM.    Stress in life is not more than usual.  Current Medications (verified): 1)  Advair Diskus 500-50 Mcg/dose Misc (Fluticasone-Salmeterol) .... Inhale 1 Puff As Directed Twice A Day 2)  Albuterol 90 Mcg/act Aers (Albuterol) .... Inhale 2 Puff As Directed Four Times A Day 3)  Enalapril Maleate 20 Mg Tabs (Enalapril Maleate) .... Take 1 Tablet By Mouth Once A Day 4)  Flonase 50 Mcg/act Susp (Fluticasone Propionate) .... Spray 1 Spray Into Both Nostrils Once A Day 5)  Singulair 10 Mg Tabs (Montelukast Sodium) .... Take 1 Tablet By Mouth At Bedtime 6)  Coreg 6.25 Mg  Tabs (Carvedilol) .... One Tab By Mouth Bid 7)  Ultram 50 Mg  Tabs (Tramadol Hcl) .... 2 Tabs Three Times A Day As Needed Pain 8)  Flexeril 10 Mg Tabs (Cyclobenzaprine Hcl) .... One Three  Times A Day Prn 9)  Zocor 80 Mg  Tabs (Simvastatin) .Marland Kitchen.. 1 By Mouth Nightly 10)  Plavix 75 Mg  Tabs (Clopidogrel Bisulfate) .Marland Kitchen.. 1 By Mouth Daily 11)  Norvasc 10 Mg Tabs (Amlodipine Besylate) .Marland Kitchen.. 1 By Mouth Daily 12)  Cvs Nicotine Polacrilex 2 Mg Gum (Nicotine Polacrilex) .Marland Kitchen.. 1 Piece Every 4 Hours As Needed For Cigarette Cravings Disp: Qs X One Month 13)  Metformin Hcl 500 Mg Tabs (Metformin Hcl) .Marland Kitchen.. 1 By Mouth Daily X 5 Days, Then 1 By Mouth Two Times A Day 14)  Blood Glucose Meter  Kit (Blood Glucose Monitoring Suppl) .... Meter and Supplies Check Sugar As Directed  Allergies (verified): 1)  ! Aspirin  Social History: Reviewed history from 03/09/2007 and no changes required. 1/2 ppd x >20 years, down to 2/day in 2/05; Lives with husband, 3 grandchildren in house; Denies alcohol, illicit drugs Back to 1 ppd.  Brother passed away 25-Feb-2007, sister had a stroke.  Stress with grandchildren. Married  Physical Exam  General:  Lying down, trying to sleep, easily aroused and alert and orinted when so. Eyes:  PERL, unable to visualize fundi Mouth:  dry membranes Lungs:  clear Heart:  RRR Abdomen:  +bs, distended but soft, tender in RLQ, but  not consistenly reproducable Neurologic:  alert & oriented X3, cranial nerves II-XII intact, strength normal in all extremities, and gait normal.     Impression & Recommendations:  Problem # 1:  WEAKNESS (ICD-780.79) Very high sp gravity on urine so suspect dehydration in face of extreme heat ouside and use of dieuretics, STOP HCTZ, push fluids tonight.  Check labs, return in 2-3 days for recheck or sooner if needed. Orders: Comp Met-[facility] [id number]) Urinalysis-[facility] (00000) CBC w/Diff-[facility] ([id number]) [facility]- Est  Level 4 (25427)  Problem # 2:  DIABETES MELLITUS, TYPE II (ICD-250.00)  New diagnosis, and just started on metformin, do not think hypoglycemia is contributing but if persists will stop metformin as well.  Call in AM Her updated medication list  for this problem includes:    Enalapril Maleate 20 Mg Tabs (Enalapril maleate) .Marland Kitchen... Take 1 tablet by mouth once a day    Metformin Hcl 500 Mg Tabs (Metformin hcl) .Marland Kitchen... 1 by mouth daily x 5 days, then 1 by mouth two times a day  Orders: [facility]- Est  Level 4 (99214)  Complete Medication List: 1)  Advair Diskus 500-50 Mcg/dose Misc (Fluticasone-salmeterol) .... Inhale 1 puff as directed twice a day 2)  Albuterol 90 Mcg/act Aers (Albuterol) .... Inhale 2 puff as directed four times a day 3)  Enalapril Maleate 20 Mg Tabs (Enalapril maleate) .... Take 1 tablet by mouth once a day 4)  Flonase 50 Mcg/act Susp (Fluticasone propionate) .... Spray 1 spray into both nostrils once a day 5)  Singulair 10 Mg Tabs (Montelukast sodium) .... Take 1 tablet by mouth at bedtime 6)  Coreg 6.25 Mg Tabs (Carvedilol) .... One tab by mouth bid 7)  Ultram 50 Mg Tabs (Tramadol hcl) .... 2 tabs three times a day as needed pain 8)  Flexeril 10 Mg Tabs (Cyclobenzaprine hcl) .... One three times a day prn 9)  Zocor 80 Mg Tabs (Simvastatin) .Marland Kitchen.. 1 by mouth nightly 10)  Plavix 75 Mg Tabs (Clopidogrel bisulfate) .Marland Kitchen.. 1 by mouth daily 11)  Norvasc 10 Mg Tabs (Amlodipine besylate) .Marland Kitchen.. 1 by mouth daily 12)  Cvs Nicotine Polacrilex 2 Mg Gum (Nicotine polacrilex) .Marland Kitchen.. 1 piece every 4 hours as needed for cigarette cravings disp: qs x one month 13)  Metformin Hcl 500 Mg Tabs (Metformin hcl) .Marland Kitchen.. 1 by mouth daily x 5 days, then 1 by mouth two times a day 14)  Blood Glucose Meter Kit (Blood glucose monitoring suppl) .... Meter and supplies check sugar as directed  Patient Instructions: 1)  Stop fluid pill, HCTZ 2)  Drink several gallons of fluids tonight 3)  Eat or if not eating do not take metformin 4)  Return sooner if needed otherwise 5)  Return with primary MD or Luz Brazen if available on Thursday or Friday.  Laboratory Results   Urine Tests  Date/Time Received: June 16, 2009 2:31 PM  Date/Time Reported: June 16, 2009  2:36 PM   Routine Urinalysis   Color: yellow Appearance: Clear Glucose: negative   (Normal Range: Negative) Bilirubin: negative   (Normal Range: Negative) Ketone: negative   (Normal Range: Negative) Spec. Gravity: >=1.030   (Normal Range: 1.003-1.035) Blood: negative   (Normal Range: Negative) pH: 5.5   (Normal Range: 5.0-8.0) Protein: trace   (Normal Range: Negative) Urobilinogen: 0.2   (Normal Range: 0-1) Nitrite: negative   (Normal Range: Negative) Leukocyte Esterace: negative   (Normal Range: Negative)    Comments: ...........test performed by...........Marland KitchenTerese Door, CMA

## 2011-01-26 NOTE — Consult Note (Signed)
Summary: [facility]  [facility]   Imported By: Clydell Hakim 05/19/2009 15:51:30  _____________________________________________________________________  External Attachment:    Type:   Image     Comment:   External Document

## 2011-01-26 NOTE — Assessment & Plan Note (Signed)
Summary: [name] [name] - Rx clinic   Vital Signs:  Patient profile:   65 year old female Height:      62 inches Weight:      155 pounds BMI:     28.45 Pulse rate:   73 / minute BP sitting:   152 / 87  (right arm)  Primary Care Provider:  Eustaquio Boyden  MD   History of Present Illness: Arrives in good spirits.   Reports adherence with all medications stating "I take all of my medicines"  Reports pain with walking.  Pain is described as dull/ aching,  cramping, burning,  which occurs after:10-15  minutes of exercise.  Denies pain starting while at rest or standing still. Reports pain worsens when walking up hill or in a hurry. Reports pain when walking at an ordinary pace on a level surface (about 100 yards).  Reports pain resolves on sitting after 10  mins.  Pain is localized to both calves. Reports that the pain is worse in the right leg.    Habits & Providers  Alcohol-Tobacco-Diet     Tobacco Status: current     Year Started: 1973     Pack years: 9.50  Current Medications (verified): 1)  Plavix 75 Mg  Tabs (Clopidogrel Bisulfate) .Marland Kitchen.. 1 By Mouth Daily 2)  Norvasc 10 Mg Tabs (Amlodipine Besylate) .Marland Kitchen.. 1 By Mouth Daily 3)  Zocor 80 Mg  Tabs (Simvastatin) .Marland Kitchen.. 1 By Mouth Nightly 4)  Coreg 6.25 Mg  Tabs (Carvedilol) .... One Tab By Mouth Bid 5)  Enalapril Maleate 20 Mg Tabs (Enalapril Maleate) .... Take 1 Tablet By Mouth Once A Day 6)  Metformin Hcl 500 Mg Tabs (Metformin Hcl) .Marland Kitchen.. 1 By Mouth Two Times A Day 7)  Advair Diskus 500-50 Mcg/dose Misc (Fluticasone-Salmeterol) .... Inhale 1 Puff As Directed Twice A Day 8)  Albuterol 90 Mcg/act Aers (Albuterol) .... Inhale 2 Puff As Directed Four Times A Day 9)  Singulair 10 Mg Tabs (Montelukast Sodium) .... Take 1 Tablet By Mouth At Bedtime 10)  Flonase 50 Mcg/act Susp (Fluticasone Propionate) .... Spray 1 Spray Into Both Nostrils Once A Day 11)  Ultram 50 Mg  Tabs (Tramadol Hcl) .... 2 Tabs Twice Daily For Pain As Needed 12)  Flexeril 10  Mg Tabs (Cyclobenzaprine Hcl) .... One By Mouth Two Times A Day As Needed Muscle Spasm 13)  Cvs Nicotine Polacrilex 2 Mg Gum (Nicotine Polacrilex) .Marland Kitchen.. 1 Piece Every 4 Hours As Needed For Cigarette Cravings Disp: Qs X One Month 14)  Onetouch Ultrasoft Lancets  Misc (Lancets) .... Use As Directed 15)  Onetouch Ultra Test  Strp (Glucose Blood) .... Use As Directed, Qs 1 Month  Allergies: 1)  ! Aspirin  Physical Exam  Extremities:  Lower extremities Physical Exam includes: Cool to palpation, delayed capillary refill, diminished pulses, absence of limb hair,  current minor ulceration,  thickened brittle nails.  Both ABI overall = :0.65 Right Arm: 152 mmHg    Left Arm: 154 mmHg Right ankle posterior tibial: 82  mmHg     dorsalis pedis: 88  mmHg Left ankle posterior tibial: 100  mmHg    dorsalis pedis: 110  mmHg     Impression & Recommendations:  Problem # 1:  PERIPHERAL VASCULAR DISEASE (ICD-443.9) Assessment Unchanged  Moderate PAD based on ABI of 0.65  in a patient with symptoms of  leg pain during exertion relieved at rest.  Patient has a history of leg stenting.  Continued current medication regimen. Possibly will  add Trental in the future. Importance of quitting smoking was discussed with the patient.  Written quit plan provided.   F/U Clinic Visit with Dr.: Sharen Hones.  Total time with patient in face-to-face counseling: 45   minutes.  Patient seen with: Marcheta Grammes, PharmD Candidate, Bethanne Ginger, PharmD, resident.   Her updated medication list for this problem includes:    Plavix 75 Mg Tabs (Clopidogrel bisulfate) .Marland Kitchen... 1 by mouth daily  Orders: Reassessment Each 15 min unit- [facility] (04540)  Problem # 2:  TOBACCO DEPENDENCE (ICD-305.1) Assessment: Unchanged  Moderate - Severe Nicotine Abuse of many years duration in a patient who is fair candidate for success b/c of motivation to quit and willingness to use nicotine gum.   Will continue nicotine replacement tx: gum. She will  set up a quit date. Currently the goal is to take the number of cigarettes to 5 a day. Patient counseled on purpose, proper use, and potential adverse effects, including tachycardia, tremor, difficulty sleeping.  Written information provided.    F/U Rx Clinic Visit: in 2 months.    Total time with patient in face-to-face counseling: 45 minutes.  Patient seen with: Marcheta Grammes, PharmD Candidate, Bethanne Ginger, PharmD, resident.   Her updated medication list for this problem includes:    Cvs Nicotine Polacrilex 2 Mg Gum (Nicotine polacrilex) .Marland Kitchen... 1 piece every 4 hours as needed for cigarette cravings disp: qs x one month  [facility]  Complete Medication List: 1)  Plavix 75 Mg Tabs (Clopidogrel bisulfate) .Marland Kitchen.. 1 by mouth daily 2)  Norvasc 10 Mg Tabs (Amlodipine besylate) .Marland Kitchen.. 1 by mouth daily 3)  Zocor 80 Mg Tabs (Simvastatin) .Marland Kitchen.. 1 by mouth nightly 4)  Coreg 6.25 Mg Tabs (Carvedilol) .... One tab by mouth bid 5)  Enalapril Maleate 20 Mg Tabs (Enalapril maleate) .... Take 1 tablet by mouth once a day 6)  Metformin Hcl 500 Mg Tabs (Metformin hcl) .Marland Kitchen.. 1 by mouth two times a day 7)  Advair Diskus 500-50 Mcg/dose Misc (Fluticasone-salmeterol) .... Inhale 1 puff as directed twice a day 8)  Albuterol 90 Mcg/act Aers (Albuterol) .... Inhale 2 puff as directed four times a day 9)  Singulair 10 Mg Tabs (Montelukast sodium) .... Take 1 tablet by mouth at bedtime 10)  Flonase 50 Mcg/act Susp (Fluticasone propionate) .... Spray 1 spray into both nostrils once a day 11)  Ultram 50 Mg Tabs (Tramadol hcl) .... 2 tabs twice daily for pain as needed 12)  Flexeril 10 Mg Tabs (Cyclobenzaprine hcl) .... One by mouth two times a day as needed muscle spasm 13)  Cvs Nicotine Polacrilex 2 Mg Gum (Nicotine polacrilex) .Marland Kitchen.. 1 piece every 4 hours as needed for cigarette cravings disp: qs x one month 14)  Onetouch Ultrasoft Lancets Misc (Lancets) .... Use as directed 15)   Onetouch Ultra Test Strp (Glucose blood) .... Use as directed, qs 1 month  Patient Instructions: 1)  Leg evaluation showed poor blood flow. 2)  Goal is to decrease number of cigarettes to 5 per day. 3)  Follow up with Dr. Sharen Hones in May.  Tobacco Counseling   Currently uses tobacco.    Cigarettes      Year started smoking cigarettes:      1973     Number of packs per day smoked:      0.25     Years smoked:            38     Packs  per year:          91.25     Pack Years:            9.50   Readiness to Quit:     Somewhat Ready Cessation Stage:     Contemplative Target Quit Date:     05/27/2010 Previous Quit Attempts   Previously Tried to quit:     Yes # of Previous quit attempts:     5 Longest Successful Quit Period:   1 year  Quit Methods Tried:      -  nicotine patch     -  nicotine gum  Reason for restarting:      -  stress  Comments: to quit use of tobacco products  Smoking Cessation Plan   Readiness:     Somewhat Ready Cessation Stage:   Contemplative Target Quit Date:   05/27/2010 Orders Added this Update:  Reassessment Each 15 min unit- [facility] [62952]   Prevention & Chronic Care Immunizations   Influenza vaccine: Fluvax Non-MCR  (09/25/2009)   Influenza vaccine deferral: Deferred  (08/25/2009)   Influenza vaccine due: 08/27/2010    Tetanus booster: 09/26/2002: Done.   Tetanus booster due: 09/26/2012    Pneumococcal vaccine: Done.  (10/27/2001)   Pneumococcal vaccine due: None    H. zoster vaccine: Not documented  Colorectal Screening   Hemoccult: Done.  (02/24/1997)   Hemoccult action/deferral: Ordered  (09/25/2009)   Hemoccult due: 02/24/1998    Colonoscopy: Not documented   Colonoscopy action/deferral: Deferred  (07/22/2009)  Other Screening   Pap smear: normal  (10/01/2008)   Pap smear action/deferral: Deferred-3 yr interval  (04/13/2010)   Pap smear due: 10/02/2011    Mammogram: BI-RADS CATEGORY 4:  Suspicious abnormality - biopsy  should be^MM DIGITAL DIAG LTD R  (10/22/2008)   Mammogram due: 01/27/2005    DXA bone density scan: Not documented   Smoking status: current  (04/21/2010)   Smoking cessation counseling: yes  (12/05/2008)   Target quit date: 05/27/2010  (04/21/2010)  Diabetes Mellitus   HgbA1C: 6.9  (03/11/2010)   Hemoglobin A1C due: 06/09/2007    Eye exam: Not documented   Diabetic eye exam action/deferral: Ophthalmology referral  (07/22/2009)    Foot exam: yes  (07/22/2009)   Foot exam action/deferral: Deferred   High risk foot: Not documented   Foot care education: Done  (07/22/2009)   Foot exam due: 11/25/2009    Urine microalbumin/creatinine ratio: Not documented    Diabetes flowsheet reviewed?: Yes   Progress toward A1C goal: At goal  Lipids   Total Cholesterol: 208  (07/22/2009)   LDL: 136  (07/22/2009)   LDL Direct: 54  (03/11/2010)   HDL: 34  (07/22/2009)   Triglycerides: 191  (07/22/2009)    SGOT (AST): 19  (03/11/2010)   SGPT (ALT): 21  (03/11/2010)   Alkaline phosphatase: 116  (03/11/2010)   Total bilirubin: 0.3  (03/11/2010)    Lipid flowsheet reviewed?: Yes   Progress toward LDL goal: At goal  Hypertension   Last Blood Pressure: 152 / 87  (04/21/2010)   Serum creatinine: 1.03  (03/11/2010)   Serum potassium 4.2  (03/11/2010)   Basic metabolic panel due: 09/16/2009    Hypertension flowsheet reviewed?: Yes   Progress toward BP goal: Deteriorated  Self-Management Support :   Personal Goals (by the next clinic visit) :     Personal A1C goal: 8  (12/10/2009)     Personal blood pressure goal: 140/90  (08/25/2009)  Personal LDL goal: 100  (08/25/2009)    Diabetes self-management support: CBG self-monitoring log, Written self-care plan, Education handout  (02/11/2010)    Diabetes self-management support not done because: Good outcomes  (04/13/2010)    Hypertension self-management support: Written self-care plan, Education handout  (04/21/2010)   Hypertension  self-care plan printed.   Hypertension education handout printed    Hypertension self-management support not done because: Good outcomes  (04/13/2010)    Lipid self-management support: Written self-care plan, Education handout  (02/11/2010)     Lipid self-management support not done because: Good outcomes  (04/21/2010)

## 2011-01-26 NOTE — Assessment & Plan Note (Signed)
Summary: F/U BP PER Ashtyn Meland/[facility]   Vital Signs:  Patient Profile:   65 Years Old Female Height:     62 inches Weight:      153.7 pounds Temp:     98.2 degrees F Pulse rate:   65 / minute BP sitting:   131 / 65  (right arm)  Pt. in pain?   no  Vitals Entered By: Jacki Cones RN (October 02, 2007 3:22 PM)                  PCP:  Levander Campion MD  Chief Complaint:  f/u bp.  History of Present Illness: [name] is here to follow up for her blood pressure.  She had it taken at church a few weeks ago and it was 190s/110s. This scared her, and she began to start taking her medications.  Today it is much improved at 131/65.  She reports this is around what it has been when the church RN checks it since she has restarted her blood pressure pills.  She denies chest pain, shortness of breath, edema, lightheadedness. She complains of intermittent calf pain bilaterally, right greater than left, that has gradually worsened over the past several months.  She reports that it begins to hurt after she walks a short distance, approx 50- 100 yards.  She describes the pain as a throbbing or ache.  It does not occur at rest. When going through her medication list, she reports having stopped her pravachol about 6 months ago, she could not really say why.  No muscle aches.    Current Allergies: ! ASPIRIN    Risk Factors:     Physical Exam  General:     Well-developed,well-nourished,in no acute distress; alert,appropriate and cooperative throughout examination Neck:     no jVD Lungs:     Normal respiratory effort, chest expands symmetrically. Lungs are clear to auscultation, no crackles or wheezes. Heart:     Normal rate and regular rhythm. S1 and S2 normal without gallop, murmur, click, rub or other extra sounds. Extremities:     No clubbing, cyanosis, edema, or deformity noted with normal full range of motion of all joints.  Nontender to palpation.  2 + pulses  bilaterally    Impression & Recommendations:  Problem # 1:  INTERMITTENT CLAUDICATION, BILATERAL (ICD-443.9) Patient's symptoms concerning for PAD.  Will send for ABI. Orders: LE Arterial Doppler/ABI (LE arterial doppler) [facility]- Est  Level 4 (10272)   Problem # 2:  HYPERTENSION, BENIGN SYSTEMIC (ICD-401.1) Assessment: Improved Much improved. Continue current medications. Her updated medication list for this problem includes:    Enalapril Maleate 20 Mg Tabs (Enalapril maleate) .Marland Kitchen... Take 1 tablet by mouth once a day    Hydrochlorothiazide 25 Mg Tabs (Hydrochlorothiazide) .Marland Kitchen... Take 1 tablet by mouth every morning    Coreg 6.25 Mg Tabs (Carvedilol) ..... One tab by mouth bid  Orders: [facility]- Est  Level 4 (53664)   Problem # 3:  TOBACCO DEPENDENCE (ICD-305.1) Again stressed importance of stopping smoking.  Patient states she is trying but is under tremendous stress. Recommended alternative stress outlet such as exercise.  Patient states she will keep trying.  Does not want any help currently Orders: [facility]- Est  Level 4 (99214)   Problem # 4:  HYPERCHOLESTEROLEMIA (ICD-272.0) Last lipid panel not at goal.  will have patient restart pravachol, recheck FLP and LFTs in six weeks and adjust med as necessary for LDL goal <100 Her updated medication list for  this problem includes:    Pravachol 40 Mg Tabs (Pravastatin sodium) .Marland Kitchen... 1 by mouth daily  Orders: [facility]- Est  Level 4 (99214)   Complete Medication List: 1)  Advair Diskus 500-50 Mcg/dose Misc (Fluticasone-salmeterol) .... Inhale 1 puff as directed twice a day 2)  Albuterol 90 Mcg/act Aers (Albuterol) .... Inhale 2 puff as directed four times a day 3)  Enalapril Maleate 20 Mg Tabs (Enalapril maleate) .... Take 1 tablet by mouth once a day 4)  Flonase 50 Mcg/act Susp (Fluticasone propionate) .... Spray 1 spray into both nostrils once a day 5)  Hydrochlorothiazide 25 Mg Tabs (Hydrochlorothiazide) .... Take 1 tablet by mouth every  morning 6)  Singulair 10 Mg Tabs (Montelukast sodium) .... Take 1 tablet by mouth at bedtime 7)  Coreg 6.25 Mg Tabs (Carvedilol) .... One tab by mouth bid 8)  Ultram 50 Mg Tabs (Tramadol hcl) .... One to two tabs by mouth q 6 hrs as needed pain 9)  Flexeril 10 Mg Tabs (Cyclobenzaprine hcl) .... 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 1 by mouth daily   Patient Instructions: 1)  Follow up in 6 weeks for cholesterol and leg pain. 2)  Go have the ultrasound and ABI tests for your leg pain to rule out blood clots. 3)  Continue current medications. 4)  Restart pravachol 40 mg daily.    Prescriptions: HYDROCHLOROTHIAZIDE 25 MG TABS (HYDROCHLOROTHIAZIDE) Take 1 tablet by mouth every morning  #30 x 11   Entered and Authorized by:   Levander Campion MD   Signed by:   Levander Campion MD on 10/02/2007   Method used:   Electronically sent to ...       [facility] [address]       [address]       [place], [place]  [address]       Ph: [phone]       Fax: [phone]   RxID:   [id number] ADVAIR DISKUS 500-50 MCG/DOSE MISC (FLUTICASONE-SALMETEROL) Inhale 1 puff as directed twice a day  #1 x 0   Entered and Authorized by:   Levander Campion MD   Signed by:   Levander Campion MD on 10/02/2007   Method used:   Electronically sent to ...       [facility] [address]       [address]       [place], [place]  [address]       Ph: [phone]       Fax: [phone]   RxID:   [id number] PRAVACHOL 40 MG  TABS (PRAVASTATIN SODIUM) 1 by mouth daily  #30 x 6   Entered and Authorized by:   Levander Campion MD   Signed by:   Levander Campion MD on 10/02/2007   Method used:   Electronically sent to ...       [facility] [address]       [address]       [place], [place]  [address]        Ph: [phone]       Fax: [phone]   RxID:   [id number]  ]

## 2011-01-26 NOTE — Assessment & Plan Note (Signed)
Summary: f/u visit/[facility]   Vital Signs:  Patient profile:   65 year old female Height:      61.5 inches Weight:      152 pounds BMI:     28.36 Pulse rate:   69 / minute BP sitting:   109 / 73  Vitals Entered By: Arlyss Repress CMA, (June 02, 2009 3:03 PM) CC: low back pain, blurry vision Is Patient Diabetic? No Pain Assessment Patient in pain? yes     Location: right lower back Intensity: 8 Onset of pain  x 3 weeks   Primary Care Provider:  Levander Campion MD  CC:  low back pain and blurry vision.  History of Present Illness: Patient presents to discuss the following:  1. low back pain:  continues to have flare of her low back pain, this time sharp pains shoot down left leg to knee.  Hurts to walk.  Denies weakness, numbness, bowel or bladder problems, fever.  Flexeril and tramadol really help.  Declines return to PT  2.  blurry vision: notes 2 months of blurry vision, fatigue, and thirst.  Has history of glucose intolerance.    3. tobacco:  continues to smoke despite PAD.  Stress is main trigger.  Trying to quit with nicotine gum  Habits & Providers  Alcohol-Tobacco-Diet     Tobacco Status: current     Tobacco Counseling: to quit use of tobacco products  Allergies: 1)  ! Aspirin  Physical Exam  General:  alert, pleasant, NAD, gets on exam table with ease Msk:  lumbar paraspinal muscles tender to palpation, tender to palpation left buttock negative straight leg raise strength 5/5 BLE sensation intact BLE DTRs 2+ BLE   Impression & Recommendations:  Problem # 1:  LOW BACK PAIN SYNDROME, SEVERE (ICD-724.2) Assessment Improved  slightly improved from prior.  PT would really benefit patient, but she reports she doesn't want to go because it won't fix the problem, and she does not have a ride.  She is happy to continue tramadol and flexeril.  No red flag symptoms Her updated medication list for this problem includes:    Ultram 50 Mg Tabs (Tramadol hcl) .Marland Kitchen... 2  tabs three times a day as needed pain    Flexeril 10 Mg Tabs (Cyclobenzaprine hcl) ..... One three times a day prn  Orders: [facility]- Est  Level 4 ([address])  Problem # 2:  BLURRED VISION (ICD-368.8) Assessment: New  blurry vision and excessive thirst concerning for development of diabetes.  Will check blood glucose and A1C.  A1C 7.5, patient had to leave prior to test results coming back.  Will have her return for DM visit  Orders: [facility]- Est  Level 4 (60454)  Problem # 3:  TOBACCO DEPENDENCE (ICD-305.1) Assessment: Improved trying to quit with gum.  encouragement provided Her updated medication list for this problem includes:    Cvs Nicotine Polacrilex 2 Mg Gum (Nicotine polacrilex) .Marland Kitchen... 1 piece every 4 hours as needed for cigarette cravings disp: qs x one month  Orders: [facility]- Est  Level 4 (99214)  Complete Medication List: 1)  Advair Diskus 500-50 Mcg/dose Misc (Fluticasone-salmeterol) .... Inhale 1 puff as directed twice a day 2)  Albuterol 90 Mcg/act Aers (Albuterol) .... Inhale 2 puff as directed four times a day 3)  Enalapril Maleate 20 Mg Tabs (Enalapril maleate) .... Take 1 tablet by mouth once a day 4)  Flonase 50 Mcg/act Susp (Fluticasone propionate) .... Spray 1 spray into both nostrils once a day 5)  Hydrochlorothiazide 25 Mg Tabs (Hydrochlorothiazide) .... Take 1 tablet by mouth every morning 6)  Singulair 10 Mg Tabs (Montelukast sodium) .... Take 1 tablet by mouth at bedtime 7)  Coreg 6.25 Mg Tabs (Carvedilol) .... One tab by mouth bid 8)  Ultram 50 Mg Tabs (Tramadol hcl) .... 2 tabs three times a day as needed pain 9)  Flexeril 10 Mg Tabs (Cyclobenzaprine hcl) .... One three times a day prn 10)  Zocor 80 Mg Tabs (Simvastatin) .Marland Kitchen.. 1 by mouth nightly 11)  Plavix 75 Mg Tabs (Clopidogrel bisulfate) .Marland Kitchen.. 1 by mouth daily 12)  Norvasc 10 Mg Tabs (Amlodipine besylate) .Marland Kitchen.. 1 by mouth daily 13)  Potassium Chloride Crys Cr 20 Meq Cr-tabs (Potassium chloride crys cr) .... 2 by mouth  x one dose 14)  Cvs Nicotine Polacrilex 2 Mg Gum (Nicotine polacrilex) .Marland Kitchen.. 1 piece every 4 hours as needed for cigarette cravings disp: qs x one month  Other Orders: A1C-[facility] (04540) Basic Met-[facility] ([address]81[address])  Patient Instructions: 1)  We will call you about the diabetes test when we get the results. 2)  Continue tramadol and flexeril for back pain.  Laboratory Results   Blood Tests   Date/Time Received: June 02, 2009 3:40 PM  Date/Time Reported: June 02, 2009 4:41 PM   HGBA1C: 7.5%   (Normal Range: Non-Diabetic - 3-6%   Control Diabetic - 6-8%)  Comments: ...........test performed by...........Marland KitchenTerese Door, CMA

## 2011-01-26 NOTE — Consult Note (Signed)
Summary: [facility] Hematology/Oncology stable CLL, needs mammo  [facility] Hematology/Oncology   Imported By: Clydell Hakim 11/18/2009 12:26:32  _____________________________________________________________________  External Attachment:    Type:   Image     Comment:   External Document

## 2011-01-26 NOTE — Assessment & Plan Note (Signed)
Summary: f/u,df   Vital Signs:  Patient profile:   65 year old female Height:      62 inches Weight:      150 pounds BMI:     27.53 Temp:     98.3 degrees F oral BP sitting:   124 / 80  (right arm) Cuff size:   regular  Vitals Entered By: Tessie Fass CMA (August 20, 2010 9:52 AM) CC: F/U Is Patient Diabetic? Yes Pain Assessment Patient in pain? yes     Location: right leg   Primary Care Provider:  Clementeen Graham MD  CC:  F/U.  History of Present Illness: Diabetes Medications: Metformin 500 bid Polyurea/Polydypsia: No Last A1c: 6.6 today   Leg Pain: Right leg, with claudication symptoms. Has seen vascular surgeon, who notes restenosis of stent. Placed pt on cilostazol and plans to consider bypass graph. Currently managable if taking it eazy. No ulcers ot wounds.   Breathing:  Notes "asthma" is worse at night. When pt lays down she notes shortness of breath. She needs to sleep with 4 pillows. Additionally she wil awake at night acuetly SOB which will improve with standing. No known Dx of heart failure, and cannot remember last ECHO. Has seen dr. Algie Coffer in the past.   Tobacco: Smoking about 1 pack a week. Has cut back. Also using nicotine gum occasionally instead of a cigarette. Not ready to quit completly.   Hyperlipidemia Medications: Simvistatin 80 Current LDL: 56 Chest Pain:No Also on Norvasc   Habits & Providers  Alcohol-Tobacco-Diet     Tobacco Status: current     Tobacco Counseling: to quit use of tobacco products  Current Problems (verified): 1)  Orthopnea  (ICD-786.02) 2)  Gait Imbalance  (ICD-781.2) 3)  Hypertension, Benign Systemic  (ICD-401.1) 4)  Hypercholesterolemia  (ICD-272.0) 5)  Diabetes Mellitus, Type II  (ICD-250.00) 6)  COPD  (ICD-496) 7)  Leukemia, Lymphocytic, Chronic  (ICD-204.10) 8)  Low Back Pain Syndrome, Severe  (ICD-724.2) 9)  Adjustment Disorder With Depressed Mood  (ICD-309.0) 10)  Routine Gynecological Examination   (ICD-V72.31) 11)  Special Screening For Malignant Neoplasms Colon  (ICD-V76.51) 12)  Screening For Malignant Neoplasm of The Cervix  (ICD-V76.2) 13)  Peripheral Vascular Disease  (ICD-443.9) 14)  Tobacco Dependence  (ICD-305.1) 15)  Gastroesophageal Reflux, No Esophagitis  (ICD-530.81) 16)  Anxiety  (ICD-300.00)  Current Medications (verified): 1)  Plavix 75 Mg  Tabs (Clopidogrel Bisulfate) .Marland Kitchen.. 1 By Mouth Daily 2)  Norvasc 10 Mg Tabs (Amlodipine Besylate) .Marland Kitchen.. 1 By Mouth Daily 3)  Zocor 20 Mg Tabs (Simvastatin) .Marland Kitchen.. 1 By Mouth Qhs 4)  Coreg 6.25 Mg  Tabs (Carvedilol) .... One Tab By Mouth Bid 5)  Enalapril Maleate 20 Mg Tabs (Enalapril Maleate) .... Take 1 Tablet By Mouth Once A Day 6)  Metformin Hcl 500 Mg Tabs (Metformin Hcl) .Marland Kitchen.. 1 By Mouth Two Times A Day 7)  Advair Diskus 500-50 Mcg/dose Misc (Fluticasone-Salmeterol) .... Inhale 1 Puff As Directed Twice A Day 8)  Albuterol 90 Mcg/act Aers (Albuterol) .... Inhale 2 Puff As Directed Four Times A Day 9)  Singulair 10 Mg Tabs (Montelukast Sodium) .... Take 1 Tablet By Mouth At Bedtime 10)  Flonase 50 Mcg/act Susp (Fluticasone Propionate) .... Spray 1 Spray Into Both Nostrils Once A Day 11)  Ultram 50 Mg  Tabs (Tramadol Hcl) .... 2 Tabs Twice Daily For Pain As Needed 12)  Flexeril 10 Mg Tabs (Cyclobenzaprine Hcl) .... One By Mouth Two Times A Day As Needed Muscle Spasm  13)  Cvs Nicotine Polacrilex 2 Mg Gum (Nicotine Polacrilex) .Marland Kitchen.. 1 Piece Every 4 Hours As Needed For Cigarette Cravings Disp: Qs X One Month 14)  Onetouch Ultrasoft Lancets  Misc (Lancets) .... Use As Directed 15)  Onetouch Ultra Test  Strp (Glucose Blood) .... Use As Directed, Qs 1 Month 16)  Cilostazol 50 Mg Tabs (Cilostazol) .... Dose Per Vasc Surg  Allergies (verified): 1)  ! Aspirin  Past History:  Past Medical History: Last updated: 06/18/2010  brain tumor - meningioma removed 1998-[facility], H/o dysphagia--resolved with esoph dilation  R CVA s/p meningioma  removal Current Problems:  DIABETES MELLITUS, TYPE II (ICD-250.00) --> [facility] LEUKEMIA, LYMPHOCYTIC, CHRONIC (ICD-204.10) found on mammo with abnormal LN after biopsy 06/07/08 (stage 0, indolent) LOW BACK PAIN SYNDROME, SEVERE (ICD-724.2) ADJUSTMENT DISORDER WITH DEPRESSED MOOD (ICD-309.0) ROUTINE GYNECOLOGICAL EXAMINATION (ICD-V72.31) SPECIAL SCREENING FOR MALIGNANT NEOPLASMS COLON (ICD-V76.51) SCREENING FOR MALIGNANT NEOPLASM OF THE CERVIX (ICD-V76.2) PERIPHERAL VASCULAR DISEASE (ICD-443.9) - ABI 0.68 2010-06-07, h/o bypass RLE TOBACCO DEPENDENCE (ICD-305.1) HYPERTENSION, BENIGN SYSTEMIC (ICD-401.1) HYPERCHOLESTEROLEMIA (ICD-272.0) GASTROESOPHAGEAL REFLUX, NO ESOPHAGITIS (ICD-530.81) COPD (ICD-496) ANXIETY (ICD-300.00)  Past Surgical History: Last updated: 07/22/2009 Cardiac Cath: normal coronary arteries 02/24/2002 Cholecystectomy - 03/27/2002 EGD: esophageal dilation (Dr. Madilyn Fireman); esophagus was diffusely dilated distally w/ abrupt taper into LES-?achalasia - 01/28/2004 Spirometry: mod obstr dz w/ mod improvement post-bronchodilator - 03/27/2004 Bypass grafting of RLE for PAD claudication - SFA patent with popliteal stent in place (12/28/2006) Cardiac Cath: normal CA 04/05/2007  Family History: Last updated: 02/23/2007 `diabetes in several family members`, Brother--IDDM, Father--unknown (was not involved with her), Mother--deceased 2004/06/07; CAD, Unsure about other family history  Social History: Last updated: 08/20/2010 1 pack every week.  h/o quitting in past.  Lives with husband, 3 grandchildren in house; Denies alcohol, illicit drugs Brother passed away 2007-03-10, sister had a stroke.  Stress with grandchildren. Married  Risk Factors: Exercise: no (03/09/2007)  Risk Factors: Smoking Status: current (08/20/2010) Packs/Day: <0.25 (05/15/2010)  Social History: 1 pack every week.  h/o quitting in past.  Lives with husband, 3 grandchildren  in house; Denies alcohol, illicit drugs Brother passed away Mar 10, 2007, sister had a stroke.  Stress with grandchildren. Married  Review of Systems       The patient complains of dyspnea on exertion and difficulty walking.  The patient denies anorexia, fever, weight loss, decreased hearing, hoarseness, chest pain, syncope, peripheral edema, prolonged cough, abdominal pain, severe indigestion/heartburn, suspicious skin lesions, depression, unusual weight change, and breast masses.    Physical Exam  General:  VS noted.  Eldarly appearing woman in NAD Head:  Normocephalic and atraumatic without obvious abnormalities. No apparent alopecia or balding. Eyes:  PERRL EOMI, sclera and conjentiva clear Mouth:  moist, teeth missing, no lesions noted in lips, gums or palpate Lungs:  Normal respiratory effort, chest expands symmetrically. Lungs are clear to auscultation, no crackles or wheezes. Heart:  normal S1, S2, no m/r/g Abdomen:  Bowel sounds positive,abdomen soft and non-tender without masses, organomegaly or hernias noted. Extremities:  Lower extremities Physical Exam includes: Cool to palpation, delayed capillary refill, diminished pulses, absence of limb hair,  current minor ulceration,  thickened brittle nails.  Both Skin:  Intact without suspicious lesions or rashes Cervical Nodes:  No lymphadenopathy noted Axillary Nodes:  No palpable lymphadenopathy Psych:  Cognition and judgment appear intact. Alert and cooperative with normal attention span and concentration. No apparent delusions, illusions, hallucinations   Impression & Recommendations:  Problem # 1:  ORTHOPNEA (ICD-786.02) Assessment New Likely dx is CHF based on history of PVD, and caths.  Plan to obtain ECHO to confirm and dx type of failure. If neg will consider RAD as possibility and repear PFTs.  Will follow up in 1-2 months.  ECHO at [facility]  Orders: 2 D Echo (2 D Echo) [facility]- Est  Level 4 ([address])  Problem # 2:   HYPERCHOLESTEROLEMIA (ICD-272.0)  Is taking simvistatin 80 mg with past LDL 56. New drug information of simvistatin indicates that max dose of simva + norvasc is 20mg .   Plan to decrease to 20mg  at bedtime and check lipids in 3 months. If above 70 will consider changing to lipitor.   Her updated medication list for this problem includes:    Zocor 20 Mg Tabs (Simvastatin) .Marland Kitchen... 1 by mouth qhs  Orders: [facility]- Est  Level 4 ([address])  Problem # 3:  HYPERTENSION, BENIGN SYSTEMIC (ICD-401.1)  BP well controlled. No changes planned.   Her updated medication list for this problem includes:    Norvasc 10 Mg Tabs (Amlodipine besylate) .Marland Kitchen... 1 by mouth daily    Coreg 6.25 Mg Tabs (Carvedilol) ..... One tab by mouth bid    Enalapril Maleate 20 Mg Tabs (Enalapril maleate) .Marland Kitchen... Take 1 tablet by mouth once a day  BP today: 124/80 Prior BP: 134/78 (05/15/2010)  Prior 10 Yr Risk Heart Disease: > 32 % (08/25/2009)  Labs Reviewed: K+: 4.2 (03/11/2010) Creat: : 1.03 (03/11/2010)   Chol: 208 (07/22/2009)   HDL: 34 (07/22/2009)   LDL: 136 (07/22/2009)   TG: 191 (07/22/2009)  Orders: [facility]- Est  Level 4 ([address])  Problem # 4:  DIABETES MELLITUS, TYPE II (ICD-250.00) DM well controlled with H1c 6.6. No changes planned.   Her updated medication list for this problem includes:    Enalapril Maleate 20 Mg Tabs (Enalapril maleate) .Marland Kitchen... Take 1 tablet by mouth once a day    Metformin Hcl 500 Mg Tabs (Metformin hcl) .Marland Kitchen... 1 by mouth two times a day  Orders: A1C-[facility] ([address]78) [facility]- Est  Level 4 ([address])  Labs Reviewed: Creat: 1.03 (03/11/2010)    Reviewed HgBA1c results: 6.6 (08/20/2010)  6.9 (03/11/2010)  Problem # 5:  PERIPHERAL VASCULAR DISEASE (ICD-443.9) Assessment: Deteriorated On cilostazol. Followed by vasc surg. Advised to quit tobacco. Will follow. Her updated medication list for this problem includes:    Plavix 75 Mg Tabs (Clopidogrel bisulfate) .Marland Kitchen... 1 by mouth daily    Cilostazol 50 Mg Tabs  (Cilostazol) .Marland Kitchen... Dose per vasc surg  Problem # 6:  TOBACCO DEPENDENCE (ICD-305.1) Assessment: Improved Advised to quit. Pt not yet ready. Will readdress at next visit.  Her updated medication list for this problem includes:    Cvs Nicotine Polacrilex 2 Mg Gum (Nicotine polacrilex) .Marland Kitchen... 1 piece every 4 hours as needed for cigarette cravings disp: qs x one month  Complete Medication List: 1)  Plavix 75 Mg Tabs (Clopidogrel bisulfate) .Marland Kitchen.. 1 by mouth daily 2)  Norvasc 10 Mg Tabs (Amlodipine besylate) .Marland Kitchen.. 1 by mouth daily 3)  Zocor 20 Mg Tabs (Simvastatin) .Marland Kitchen.. 1 by mouth qhs 4)  Coreg 6.25 Mg Tabs (Carvedilol) .... One tab by mouth bid 5)  Enalapril Maleate 20 Mg Tabs (Enalapril maleate) .... Take 1 tablet by mouth once a day 6)  Metformin Hcl 500 Mg Tabs (Metformin hcl) .Marland Kitchen.. 1 by mouth two times a day 7)  Advair Diskus 500-50 Mcg/dose Misc (Fluticasone-salmeterol) .... Inhale 1 puff as directed twice a day 8)  Albuterol 90  Mcg/act Aers (Albuterol) .... Inhale 2 puff as directed four times a day 9)  Singulair 10 Mg Tabs (Montelukast sodium) .... Take 1 tablet by mouth at bedtime 10)  Flonase 50 Mcg/act Susp (Fluticasone propionate) .... Spray 1 spray into both nostrils once a day 11)  Ultram 50 Mg Tabs (Tramadol hcl) .... 2 tabs twice daily for pain as needed 12)  Flexeril 10 Mg Tabs (Cyclobenzaprine hcl) .... One by mouth two times a day as needed muscle spasm 13)  Cvs Nicotine Polacrilex 2 Mg Gum (Nicotine polacrilex) .Marland Kitchen.. 1 piece every 4 hours as needed for cigarette cravings disp: qs x one month 14)  Onetouch Ultrasoft Lancets Misc (Lancets) .... Use as directed 15)  Onetouch Ultra Test Strp (Glucose blood) .... Use as directed, qs 1 month 16)  Cilostazol 50 Mg Tabs (Cilostazol) .... Dose per vasc surg  Patient Instructions: 1)  Thank you for seeing me today. 2)  Please schedule a follow-up appointment in 1-2 month.  3)  If you have chest pain, difficulty breathing, fevers over 102  that does not get better with tylenol please call us or see a doctor.  4)  Start taking 20mg  of simvastatin at night. 5)  Get you ECHO of your heart. Prescriptions: ZOCOR 20 MG TABS (SIMVASTATIN) 1 by mouth qhs  #30 x 12   Entered and Authorized by:   Clementeen Graham MD   Signed by:   Clementeen Graham MD on 08/20/2010   Method used:   Electronically to        [facility]* (mail-order)       [address]       [place], [place]  [address]       Ph: [phone]       Fax: [phone]   RxID:   [id number]    Prevention & Chronic Care Immunizations   Influenza vaccine: Fluvax Non-MCR  (09/25/2009)   Influenza vaccine deferral: Deferred  (08/25/2009)   Influenza vaccine due: 08/27/2010    Tetanus booster: 09/26/2002: Done.   Tetanus booster due: 09/26/2012    Pneumococcal vaccine: Done.  (10/27/2001)   Pneumococcal vaccine due: 10/27/2006    H. zoster vaccine: Not documented  Colorectal Screening   Hemoccult: Done.  (02/24/1997)   Hemoccult action/deferral: Ordered  (09/25/2009)   Hemoccult due: 02/24/1998    Colonoscopy: Not documented   Colonoscopy action/deferral: Deferred  (07/22/2009)  Other Screening   Pap smear: normal  (10/01/2008)   Pap smear action/deferral: Deferred-3 yr interval  (04/13/2010)   Pap smear due: 10/02/2011    Mammogram: BI-RADS CATEGORY 4:  Suspicious abnormality - biopsy should be^MM DIGITAL DIAG LTD R  (10/22/2008)   Mammogram action/deferral: Deferred  (08/20/2010)   Mammogram due: 10/22/2009    DXA bone density scan: Not documented   Smoking status: current  (08/20/2010)   Smoking cessation counseling: yes  (12/05/2008)   Target quit date: 05/27/2010  (04/21/2010)  Diabetes Mellitus   HgbA1C: 6.6  (08/20/2010)   Hemoglobin A1C due: 06/09/2007    Eye exam: Not documented   Diabetic eye exam action/deferral: Ophthalmology referral  (07/22/2009)    Foot exam: yes  (07/22/2009)   Foot exam action/deferral: Deferred    High risk foot: Not documented   Foot care education: Done  (07/22/2009)   Foot exam due: 11/25/2009    Urine microalbumin/creatinine ratio: Not documented    Diabetes flowsheet reviewed?: Yes   Progress toward A1C goal: At goal  Lipids   Total Cholesterol: 208  (  07/22/2009)   LDL: 136  (07/22/2009)   LDL Direct: 54  (03/11/2010)   HDL: 34  (07/22/2009)   Triglycerides: 191  (07/22/2009)    SGOT (AST): 19  (03/11/2010)   SGPT (ALT): 21  (03/11/2010)   Alkaline phosphatase: 116  (03/11/2010)   Total bilirubin: 0.3  (03/11/2010)    Lipid flowsheet reviewed?: Yes   Progress toward LDL goal: At goal  Hypertension   Last Blood Pressure: 124 / 80  (08/20/2010)   Serum creatinine: 1.03  (03/11/2010)   Serum potassium 4.2  (03/11/2010)   Basic metabolic panel due: 09/16/2009    Hypertension flowsheet reviewed?: Yes   Progress toward BP goal: At goal  Self-Management Support :   Personal Goals (by the next clinic visit) :     Personal A1C goal: 8  (12/10/2009)     Personal blood pressure goal: 140/90  (08/25/2009)     Personal LDL goal: 100  (08/25/2009)    Patient will work on the following items until the next clinic visit to reach self-care goals:     Medications and monitoring: take my medicines every day, check my blood pressure, examine my feet every day  (08/20/2010)     Eating: eat more vegetables  (08/25/2009)    Diabetes self-management support: Written self-care plan  (05/15/2010)    Diabetes self-management support not done because: Good outcomes  (04/13/2010)    Hypertension self-management support: Written self-care plan  (05/15/2010)    Hypertension self-management support not done because: Good outcomes  (04/13/2010)    Lipid self-management support: Written self-care plan  (05/15/2010)     Lipid self-management support not done because: Good outcomes  (04/21/2010)   Laboratory Results   Blood Tests   Date/Time Received: August 20, 2010 10:00  AM  Date/Time Reported: August 20, 2010 10:41 AM   HGBA1C: 6.6%   (Normal Range: Non-Diabetic - 3-6%   Control Diabetic - 6-8%)  Comments: ..............test performed by...............Marland KitchenTessie Fass, MA .............entered by...........Marland KitchenBonnie A. [place], MLS (ASCP)cm

## 2011-01-26 NOTE — Letter (Signed)
Summary: Generic Letter  [facility]  [address]   [place], [place] [address]   Phone: [phone]  Fax: [phone]    06/03/2009  [name] [name] [address] [place], [place]  [address]  Dear [name],           Sincerely,   Arlyss Repress CMA,

## 2011-01-26 NOTE — Assessment & Plan Note (Signed)
Summary: F/U  Tobacco Cessation - Rx Clinic   Vital Signs:  Patient profile:   65 year old female Weight:      147.8 pounds BMI:     28.03 Pulse rate:   65 / minute BP sitting:   107 / 68  (left arm)  Primary Care Provider:  Clementeen Graham MD   History of Present Illness: Patient arrives in good spirits.  She arrives at 11:45 for her appt scheduled for 11:15.   She believes it was an 11:45 appt.   She reports cutting down cigarettes to 1-2 per day with the help of nicotine gum.  Patient has cut down from half a pack in the last 6 months.     She continues to have stress in her home and in her family.  Her husband has recently been hospitalized with Chest Pain.  He was released from the ER.   Habits & Providers  Alcohol-Tobacco-Diet     Tobacco Status: current     Pack years: 0  Allergies: 1)  ! Aspirin   Impression & Recommendations:  Problem # 1:  TOBACCO DEPENDENCE (ICD-305.1)  Moderate nicotine abuse of 38 years  in a patient who is excellent candidate for success b/c of current use of 1 cig/day and determination to quit.  Current nicotine replacement tx is gum that she is currently using 3-4 times/day.  Patient has decided her quit day will be on Nov. 19th.  Started patient on quit now hotline. Faxed from in at the end of the visit.   The main barrier to quitting is familial issues.  F/U Rx Clinic Visit:  Mid December.  Total time with patient in face-to-face counseling: 20   minutes.  Patient seen with:  Linward Headland, PharmD Candidate  Orders: Reassessment Each 15 min unit- [facility] [phone])  Complete Medication List: 1)  Plavix 75 Mg Tabs (Clopidogrel bisulfate) .Marland Kitchen.. 1 by mouth daily 2)  Norvasc 10 Mg Tabs (Amlodipine besylate) .Marland Kitchen.. 1 by mouth daily 3)  Zocor 20 Mg Tabs (Simvastatin) .Marland Kitchen.. 1 by mouth qhs 4)  Coreg 6.25 Mg Tabs (Carvedilol) .... One tab by mouth bid 5)  Enalapril Maleate 20 Mg Tabs (Enalapril maleate) .... Take 1 tablet by mouth once a day 6)  Metformin Hcl 500  Mg Tabs (Metformin hcl) .Marland Kitchen.. 1 by mouth two times a day 7)  Advair Diskus 500-50 Mcg/dose Misc (Fluticasone-salmeterol) .... Inhale 1 puff twice daily 8)  Singulair 10 Mg Tabs (Montelukast sodium) .... Take 1 tablet by mouth at bedtime 9)  Flonase 50 Mcg/act Susp (Fluticasone propionate) .... Spray 1 spray into both nostrils once a day as needed 10)  Ultram 50 Mg Tabs (Tramadol hcl) .... 2 tabs twice daily for pain as needed 11)  Flexeril 10 Mg Tabs (Cyclobenzaprine hcl) .... One by mouth two times a day as needed muscle spasm 12)  Onetouch Ultrasoft Lancets Misc (Lancets) .... Use as directed 13)  Onetouch Ultra Test Strp (Glucose blood) .... Use as directed, qs 1 month 14)  Cilostazol 50 Mg Tabs (Cilostazol) .... Dose per vasc surg 15)  Proair Hfa 108 (90 Base) Mcg/act Aers (Albuterol sulfate) .... 2 puffs at bedtime and as needed shortness of breath. 16)  Qvar 40 Mcg/act Aers (Beclomethasone dipropionate) .... 2 puffs at bedtime 17)  Hydrochlorothiazide 25 Mg Tabs (Hydrochlorothiazide) .Marland Kitchen.. 1 by mouth daily  Patient Instructions: 1)  Quit Date - Saturday 19th! 2)  Please quit smoking completely and use only 1-2 pieces of gum if  needed.  3)  Call the [place] QUIT LINE if you want to talk with someone.  4)  Follow Up with RX Clinic in December 3-4 weeks.    Orders Added: 1)  Reassessment Each 15 min unit- [facility] [[id number]]    Tobacco Counseling   Currently uses tobacco.    Cigarettes      Year started smoking cigarettes:      1973     Number of packs per day smoked:      <0.25     Years smoked:            38     Packs per year:          0     Pack Years:            0  Cessation Stage:     Action Target Quit Date:     11/14/2010  Quitting Barriers:      -  stress  Quitting Motivators:      -  dyspnea  Previous Quit Attempts   Previously Tried to quit:     Yes # of Previous quit attempts:     5 Longest Successful Quit Period:   1 years   Quit Methods Tried:      -  nicotine  patch     -  nicotine gum  Reason for restarting:      -  stress  Comments: to quit use of tobacco products  Readiness:     Somewhat Ready Cessation Stage:   Action Target Quit Date:   11/14/2010 Orders Added this Update:  Reassessment Each 15 min unit- [facility] [[id number]]

## 2011-01-28 NOTE — Miscellaneous (Signed)
  Clinical Lists Changes  Problems: Removed problem of ROUTINE GYNECOLOGICAL EXAMINATION (ICD-V72.31) Removed problem of SPECIAL SCREENING FOR MALIGNANT NEOPLASMS COLON (ICD-V76.51) Removed problem of SCREENING FOR MALIGNANT NEOPLASM OF THE CERVIX (ICD-V76.2)

## 2011-01-29 NOTE — Letter (Signed)
Summary: [facility]:  Renal Artery Duplex Evaluation  [facility]:  Renal Artery Duplex Evaluation   Imported By: Haydee Salter 10/23/2008 09:55:08  _____________________________________________________________________  External Attachment:    Type:   Image     Comment:   External Document

## 2011-01-29 NOTE — Consult Note (Signed)
Summary: PT d/c - overall improved balance/gait speed  [facility]-Discharge Summary   Imported By: Clydell Hakim 04/15/2010 11:55:29  _____________________________________________________________________  External Attachment:    Type:   Image     Comment:   External Document

## 2011-04-07 ENCOUNTER — Ambulatory Visit (INDEPENDENT_AMBULATORY_CARE_PROVIDER_SITE_OTHER): Payer: Medicare Other | Admitting: Family Medicine

## 2011-04-07 DIAGNOSIS — F17200 Nicotine dependence, unspecified, uncomplicated: Secondary | ICD-10-CM

## 2011-04-07 DIAGNOSIS — J449 Chronic obstructive pulmonary disease, unspecified: Secondary | ICD-10-CM

## 2011-04-07 DIAGNOSIS — I10 Essential (primary) hypertension: Secondary | ICD-10-CM

## 2011-04-07 DIAGNOSIS — E119 Type 2 diabetes mellitus without complications: Secondary | ICD-10-CM

## 2011-04-07 MED ORDER — MONTELUKAST SODIUM 10 MG PO TABS: 10.0000 mg | ORAL_TABLET | Freq: Every day | ORAL | Status: DC

## 2011-04-07 MED ORDER — ALBUTEROL SULFATE HFA 108 (90 BASE) MCG/ACT IN AERS: 2.0000 | INHALATION_SPRAY | Freq: Every day | RESPIRATORY_TRACT | Status: DC

## 2011-04-07 MED ORDER — CARVEDILOL 6.25 MG PO TABS: 6.2500 mg | ORAL_TABLET | Freq: Two times a day (BID) | ORAL | Status: DC

## 2011-04-07 MED ORDER — SIMVASTATIN 20 MG PO TABS: 20.0000 mg | ORAL_TABLET | Freq: Every day | ORAL | Status: DC

## 2011-04-07 MED ORDER — HYDROCHLOROTHIAZIDE 25 MG PO TABS: 25.0000 mg | ORAL_TABLET | Freq: Every day | ORAL | Status: DC

## 2011-04-07 MED ORDER — BECLOMETHASONE DIPROPIONATE 40 MCG/ACT IN AERS: 2.0000 | INHALATION_SPRAY | Freq: Every day | RESPIRATORY_TRACT | Status: DC

## 2011-04-07 MED ORDER — METFORMIN HCL 500 MG PO TABS: 500.0000 mg | ORAL_TABLET | Freq: Two times a day (BID) | ORAL | Status: DC

## 2011-04-07 MED ORDER — CLOPIDOGREL BISULFATE 75 MG PO TABS: 75.0000 mg | ORAL_TABLET | Freq: Every day | ORAL | Status: DC

## 2011-04-07 MED ORDER — AMLODIPINE BESYLATE 10 MG PO TABS: 10.0000 mg | ORAL_TABLET | Freq: Every day | ORAL | Status: DC

## 2011-04-07 MED ORDER — ENALAPRIL MALEATE 20 MG PO TABS: 20.0000 mg | ORAL_TABLET | Freq: Every day | ORAL | Status: DC

## 2011-04-07 NOTE — Patient Instructions (Signed)
Thank you for coming in today. Come back in 3 months.  Let me know if you run out of medicine.  Let me know when it will be a good time to quit smoking.

## 2011-04-08 ENCOUNTER — Encounter: Payer: Self-pay | Admitting: Family Medicine

## 2011-04-08 NOTE — Progress Notes (Signed)
In the interim [name] [name] has run out of most of her medications. She is not taking essentially any HTN meds.   1) CHRONIC HYPERTENSION  Disease Monitoring  Blood pressure range: not checking at home  Chest pain: no   Dyspnea: no   Claudication: no   Medication compliance: no, ran out of meds as above. No refills  Medication Side Effects  Lightheadedness: no   Urinary frequency: no   Edema: no   Preventitive Healthcare:  Exercise: no   Diet Pattern: Poor/ High carbs and fat. Low fiber.   Salt Restriction: Not really.   2) CHRONIC DIABETES  Disease Monitoring  Blood Sugar Ranges: Not checking much  Polyuria: no   Visual problems: no   Medication Compliance: no, ran out of most meds as above as well. Additionally has sent requests for DM supplies for over 7 different companies last month. Is not sure how that happened. Does not know which company she wants to use.  Medication Side Effects  Hypoglycemia: no   3) Smoking: Continues to smoke about 1/2 ppd. Not ready to quit at this time.   4) COPD: Stable. Most meds ran out. No new exacerbations in the interim. Continues to smoke as above. No changes.   PMH reviewed.   ROS: As above. No fevers or chills.  Wt Readings from Last 3 Encounters:  11/09/10 147 lb 12.8 oz (67.042 kg)  10/21/10 147 lb 3.2 oz (66.769 kg)  10/13/10 149 lb 4.8 oz (67.722 kg)     Exam:  Vs noted.  Gen: Well NAD HEENT: EOMI,  MMM Lungs: CTABL Nl WOB Heart: RRR no MRG Abd: NABS, NT, ND Exts: Non edematous BL  LE

## 2011-04-08 NOTE — Assessment & Plan Note (Addendum)
Stable today. Plan refill meds. Will need a1c at the next appointment.  Pt will decide which DM company to use and stick with that one. Will fill out forms.

## 2011-04-08 NOTE — Assessment & Plan Note (Signed)
BP elevated today but not on any medications. Will refill meds today and recheck BP at the next visit. Discussed the importance of medication compliance.

## 2011-04-08 NOTE — Assessment & Plan Note (Signed)
Stable. Lungs are clear today. Plan refill COPD meds. F/u in 3 months.

## 2011-04-08 NOTE — Assessment & Plan Note (Signed)
Not ready to quit. Will be there when she is.

## 2011-04-19 ENCOUNTER — Encounter: Payer: Self-pay | Admitting: Home Health Services

## 2011-04-27 ENCOUNTER — Encounter: Payer: Self-pay | Admitting: Home Health Services

## 2011-04-27 ENCOUNTER — Ambulatory Visit (INDEPENDENT_AMBULATORY_CARE_PROVIDER_SITE_OTHER): Payer: Medicare Other | Admitting: Home Health Services

## 2011-04-27 VITALS — BP 83/59 | Temp 98.3°F | Ht 61.5 in | Wt 151.2 lb

## 2011-04-27 DIAGNOSIS — Z0000 Encounter for general adult medical examination without abnormal findings: Secondary | ICD-10-CM

## 2011-04-27 NOTE — Progress Notes (Signed)
Patient here for annual wellness visit, patient reports: Risk Factors/Conditions needing evaluation or treatment: Patient's blood pressure was low the 2 times it was measured today. Home Safety: Patient lives with husband, daughter, and 2 grandchildren.  Pt reports that her family life is very stressfull.  Patient has smoke detectors and does not have adaptive equipment in bathroom. Other Information: Corrective lens: Patient wears corrective lens daily and visits eye doctor annual due to a cataract developing on left eye. Dentures: Patient does not have dentures and visits dentist as needed. Memory: Patient reports some memory loss. Patient's Mini Mental Score (recorded in doc. flowsheet): 28 Patient's Geriatric Depression Screening score recorded in doc. flowsheet: 5/15  Balance Abnormal Patient value  Sitting balance    Arise  Pt uses hands  Attempts to arise    Immediate standing balance    Standing balance    Nudge    Eyes closed    360 degree turn    Sitting down  Pt uses hands   Gait Abnormal Patient value  Initiation of gait    Step length-left    Step length-right    Step height-left    Step height-right    Step symmetry    Step continuity    Path    Trunk    Walking stance     Pt reports some weakness in right leg and was unable to walk putting one foot in front of the other.   Annual Wellness Visit Requirements Recorded Today In  Medical, family, social history Past Medical, Family, Social History Section  Current providers Care team  Current medications Medications  Wt, BP, Ht, BMI Vital signs  Hearing assessment (welcome visit) declined  Tobacco, alcohol, illicit drug use History  ADL Nurse Assessment  Depression Screening Nurse Assessment  Cognitive impairment Nurse Assessment  Mini Mental Status Document Flowsheet  Fall Risk Nurse Assessment  Home Safety Progress Note  End of Life Planning (welcome visit) Social Documentation  Medicare preventative  services Progress Note  Risk factors/conditions needing evaluation/treatment Progress Note  Personalized health advice Patient Instructions, goals, letter  Diet & Exercise Social Documentation  Emergency Contact Social Documentation  Seat Belts Social Documentation  Sun exposure/protection Social Documentation    Prevention Plan: Recommended pt schedule screenings for colonoscopy, mammogram, bone density. Also recommended pt get pneumonia and shingles vaccine.   Recommended Medicare Prevention Screenings Women over 48 Test For Frequency Date of Last- BOLD if needed  Breast Cancer 1-2 yrs 10/09  Cervical Cancer 1-3 yrs 10/09  Colorectal Cancer 1-10 yrs recommended  Osteoporosis once recommended  Cholesterol 5 yrs 7/10  Diabetes yearly 8/11  HIV yearly declined  Influenza Shot yearly 9/11  Pneumonia Shot once recommended  Zostavax Shot once recommended

## 2011-04-27 NOTE — Patient Instructions (Signed)
1. Reduce cigarettes to 8 a day. 2. Exercise 1 time a week. 3. Schedule an appointment with Dr. Denyse Amass for July 2012 4. Schedule a mammogram. 5. Schedule a bone density screening. 6. Schedule a colonoscopy. 7. Discuss end of life wishes with family.

## 2011-05-11 NOTE — Procedures (Signed)
BYPASS GRAFT EVALUATION   INDICATION:  Follow up SFA and popliteal stent.   HISTORY:  Diabetes:  Yes.  Cardiac:  No.  Hypertension:  Yes.  Smoking:  Yes.  Previous Surgery:   SINGLE LEVEL ARTERIAL EXAM                               RIGHT              LEFT  Brachial:                    128                123  Anterior tibial:             76                 79  Posterior tibial:            84                 91  Peroneal:  Ankle/brachial index:        0.66               0.71   PREVIOUS ABI:  Date: 02/03/2009  RIGHT: 1.12  LEFT:71   LOWER EXTREMITY BYPASS GRAFT DUPLEX EXAM:   DUPLEX:  Right SFA stent is occluded from the mid thigh to the distal  thigh with a collateral branch noted.  Monophasic waveforms noted within  stent and distal to stent.   IMPRESSION:  1. Significant drop in right ankle brachial index from previous      examination.  2. Left ankle brachial index is stable from previous examination.      Right superficial femoral artery stent is occluded from the mid to      distal thigh.         ___________________________________________  V. Charlena Cross, MD   NT/MEDQ  D:  06/08/2010  T:  06/08/2010  Job:  [id number]

## 2011-05-11 NOTE — Procedures (Signed)
RENAL ARTERY DUPLEX EVALUATION   INDICATION:  Uncontrolled hypertension.   HISTORY:  Diabetes:  Yes.  Cardiac:  Yes.  Hypertension:  Yes.  Smoking:  Yes.   RENAL ARTERY DUPLEX FINDINGS:  Aorta-Proximal:  70 cm/s  Aorta-Mid:  68 cm/s  Aorta-Distal:  68 cm/s  Celiac Artery Origin:  92 cm/s  SMA Origin:  106 cm/s                                    RIGHT               LEFT  Renal Artery Origin:             88/24 cm/s          88/27 cm/s  Renal Artery Proximal:           97/33 cm/s          97/34 cm/s  Renal Artery Mid:                87/26 cm/s          95/30 cm/s  Renal Artery Distal:             87/24 cm/s          84/24 cm/s  Hilar Acceleration Time (AT):    0.03 sec            0.06 sec  Renal-Aortic Ratio (RAR):        1.4                 1.4  Kidney Size:                     10.9 cm X 4.3 cm    10.9 cm X 4.5 cm  End Diastolic Ratio (EDR):  Resistive Index (RI):            0.72                0.66   IMPRESSION:  1. Normal renal artery duplex with no evidence of stenosis      bilaterally.  2. Bilateral kidneys appear normal with respect to shape and size.   ___________________________________________  V. Charlena Cross, MD   AS/MEDQ  D:  10/10/2008  T:  10/10/2008  Job:  [id number]

## 2011-05-11 NOTE — Assessment & Plan Note (Signed)
OFFICE VISIT   [name], [name]  DOB:  02-06-1946                                       01/22/2008  EAVWU#:[id number]   HISTORY:  This is a 65 year old female whom I initially saw for  complaints of right leg claudication, which has been going on for  approximately 8 months.  She stated that she could walk approximately  200 yards, at which time she experienced severe cramping in the right  calf, which required her to stop.  She stated this was severely  lifestyle limiting for her, and that her pain did improve with rest.  She did not have any rest pain or ulcers.  On December 29, 2007, she  underwent an arteriogram and was found to have an 18 cm total occlusion  of her right superficial femoral and popliteal arteries.  I performed  subintimal recannulization of her right superficial femoral and  popliteal artery and subsequent stenting.  She tolerated the procedure  without incident and she comes back in today for followup.  The patient  now states that she has not been doing exercise to the point where she  gets cramps in her calf.  She does, however, complain of pain with  standing, although I do not think this is due to arterial insufficiency.  She is continuing to take her Plavix.  She does not have any ulcers at  this time.   PHYSICAL EXAMINATION:  Her blood pressure is 179/95, pulse 57.  In  general, well-appearing, in no acute distress.  Extremities are warm and  well-perfused.  She does not have palpable pulses.  She does not have  any ulceration.  She does not have swelling.   DIAGNOSTIC STUDIES:  The patient underwent duplex today, which shows an  increase in her right ankle brachial index from a previous of 0.67 to a  current 0.93.  Her left ABI is stable at 0.87.   ASSESSMENT AND PLAN:  Right leg claudication status post subintimal  recannulization.   PLAN:  I feel the patient is doing very well at this time.  I am happy  to see her ankle  brachial indices have improved.  Unfortunately, she is  not exercising to the extent she was prior to her intervention.  However, it could be that she is not walking further and is not having  calf pain, and therefore is improved.  At this point, she will be placed  on our graft scan protocol, which is essentially an ultrasound every 3  months.  I would like her to remain on Plavix.   Jorge Ny, MD  Electronically Signed   VWB/MEDQ  D:  01/22/2008  T:  01/23/2008  Job:  [id number]   cc:   Levander Campion, M.D.

## 2011-05-11 NOTE — Procedures (Signed)
BYPASS GRAFT EVALUATION   INDICATION:  Followup right SFA and popliteal stent.   HISTORY:  Diabetes:  Yes.  Cardiac:  Yes.  Hypertension:  Yes.  Smoking:  Yes.  Previous Surgery:  Please see above.   SINGLE LEVEL ARTERIAL EXAM                               RIGHT              LEFT  Brachial:                    128                125  Anterior tibial:             107                100  Posterior tibial:            125                89  Peroneal:  Ankle/brachial index:        0.98               0.78   PREVIOUS ABI:  Date:  10/10/2008  RIGHT:  0.98  LEFT:  0.86   LOWER EXTREMITY BYPASS GRAFT DUPLEX EXAM:   DUPLEX:  Patent right SFA and popliteal stent with no evidence of focal  stenosis.   IMPRESSION:  Patent right SFA and popliteal bypass graft with no  evidence of focal stenosis.   ___________________________________________  V. Charlena Cross, MD   MG/MEDQ  D:  10/24/2008  T:  10/24/2008  Job:  [id number]

## 2011-05-11 NOTE — Assessment & Plan Note (Signed)
OFFICE VISIT   Millward, [name] [name]  DOB:  23-May-1946                                       06/08/2010  MVHQI#:[id number]   The patient returns today for further evaluation for followup of her  right leg pain.  I have most recently seen her in January 2009.  She is  status post subintimal recanalization of a long-segment SFA occlusion on  December 29, 2007, for lifestyle-limiting claudication.  The patient had  an excellent result and resolved her symptoms.  Unfortunately, she never  returned for followup despite multiple attempts by our office to contact  her.  In the interim, her stents have occluded.  She had been having  symptoms for approximately 6 months.  She does not have rest pain.  She  does not have nonhealing wounds.  Her symptoms are claudication at  approximately 75 feet, which is aggravated by exercise and alleviated  with rest.   The patient continues to be medically managed for her diabetes,  hypertension and hypercholesterolemia.   She is married.  She does currently smoke cigarettes.  Does not drink.   REVIEW OF SYSTEMS:  GENERAL:  Negative fevers, chills weight gain,  weight loss.  VASCULAR:  As above.  CARDIAC:  Negative.  GI:  Negative.  NEURO:  Positive for headaches.  PULMONARY:  Positive for asthma.  HEME:  Negative.  URINARY:  Negative.  EENT:  Negative.  MUSCULOSKELETAL:  Positive for muscle pain.  PSYCH:  Positive for depression.  SKIN:  Negative.   PHYSICAL EXAMINATION:  Heart rate 60, blood pressure 145/79, temperature  is 97.8.  Generally, she is well-appearing, in no distress.  HEENT:  Within normal limits.  Lungs are clear bilaterally.  Cardiovascular is  regular rate and rhythm.  Her abdomen is soft, nontender.  Musculoskeletal is without major deformity.  She has no ulceration, no  edema.  Neuro:  Without focal deficits.  Skin:  Without rash.   DIAGNOSTIC STUDIES PERFORMED:  I have independently reviewed her  ultrasound, which reveals right SFA stent occlusion.  Her ABI is 0.66 on  the right and 0.71 the left.   ASSESSMENT/PLAN:  Bilateral claudication, right greater than left.   PLAN:  I discussed with the patient 2 options at this time:  1. A trial of cilostazol to see if she has any improvement of her      symptoms.  2. Repeat angiogram with plans for femoral-popliteal bypass graft.   Based on the chronicity of the patient's symptoms, I feel like it would  be worthwhile giving her a trial cilostazol to see if this will help her  symptoms.  I have given her a prescription for 100 mg twice a day.  I  will see her back in 1 month to see how she is doing.   I did discover a lump on the lateral aspects of the patient's left  thigh, which she said has been there for approximately 1 month.  It does  appear somewhat attached to the underlying muscle.  I am ordering an MRI  of her leg to evaluate this mass and will forward this on to Dr.  Sharen Hones.     Jorge Ny, MD  Electronically Signed   VWB/MEDQ  D:  06/08/2010  T:  06/09/2010  Job:  [id number]   cc:   Dr.  Sharen Hones

## 2011-05-11 NOTE — Assessment & Plan Note (Signed)
OFFICE VISIT   Magar, Letizia C  DOB:  05-Apr-1946                                       07/20/2010  AVWUJ#:[id number]   REASON FOR VISIT:  Follow-up claudication.   HISTORY:  This is a 65 year old female with right leg pain.  She  underwent submental recannulization of a long segment superficial  femoral occlusion in January of 2009.  She had excellent results but  never returned for follow-up.  She has ultimately occluded her stents.  She now has recurrent claudication.  I put her on Plavix to see if she  had any benefit.  She comes back in today for further discussions.  She  states that she has had some improvement with the Cilostazol.  She does  not have any rest pain.  She is without nonhealing ulcers.   PHYSICAL EXAMINATION:  Heart rate 66, blood pressure 113/72, O2  saturation 97%.  General:  She is well-appearing in no distress.  Respirations are nonlabored.  Extremities:  Warm and well perfused.   ASSESSMENT:  Claudication.   PLAN:  Given the patient's age and current symptomatology, we discussed  continuation of medical management.  The patient is in agreement with  this plan and will follow up with me in 6 months with lower extremity  duplex.     Jorge Ny, MD  Electronically Signed   VWB/MEDQ  D:  07/20/2010  T:  07/21/2010  Job:  [id number]

## 2011-05-11 NOTE — Op Note (Signed)
NAME:  [name] [name]               ACCOUNT NO.:  [id number]   MEDICAL RECORD NO.:  [id number]          PATIENT TYPE:  AMB   LOCATION:  SDS                          FACILITY:  [facility]   PHYSICIAN:  VDurene Cal IV, MDDATE OF BIRTH:  [birth year]   DATE OF PROCEDURE:  12/29/2007  DATE OF DISCHARGE:                               OPERATIVE REPORT   PREOPERATIVE DIAGNOSIS:  Right leg claudication.   POSTOPERATIVE DIAGNOSIS:  Right leg claudication.   PROCEDURES PERFORMED:  1. Ultrasound-guided left common femoral artery access.  2. Supervision and interpretation codes.  3. Conscious sedation from 8:55 to 10:30.  4. Abdominal aortogram.  5. Right lower extremity runoff.  6. Third-order cannulation.  7. Stent, right superficial femoral artery.  8. Stent, right popliteal artery.  9. Retrograde left common femoral artery angiogram.  10.Closure device.   INDICATIONS:  This is a 65 year old female who has lifestyle-limiting  claudication.  She comes in today for arteriogram with the possibility  of intervention.  Risks and benefits were discussed and informed consent  was signed.   DESCRIPTION OF PROCEDURE:  The patient was identified in the holding  area and taken to room #7.  She was placed supine on the table.  Bilateral groins were prepped and draped in standard sterile fashion.  Conscious sedation was performed with use of IV fentanyl and Versed  under continuous monitoring of the attending physician.  Conscious  sedation was started at 8:55 and ended at 10:30.   The left common femoral artery was evaluated with ultrasound and found  to be widely patent.  A 1% lidocaine was used for local anesthesia.  Using ultrasound guidance, left common femoral artery was accessed with  an 18-gauge needle.  A #3 FiberWire was advanced in the retrograde  fashion into the abdominal aorta under fluoroscopic visualization.  Next, a 5-French sheath was placed.  Over the wire, an Omni flush  catheter was placed at the level of L1 and an abdominal aortogram was  obtained.  Next, the aortic bifurcation was crossed with the Omni flush  catheter and a Bentson wire.  Catheter was placed in the right external  iliac artery and a right lower extremity runoff was obtained.   FINDINGS:  Aortogram:  The visualized portions of suprarenal abdominal  aorta showed minimal disease.  There are single renal arteries  bilaterally without evidence of stenosis.  The left common iliac,  external iliac, and internal iliac artery are all patent without  hemodynamically significant areas of stenosis.  The right common  external and internal iliac arteries are patent without evidence of  hemodynamically significant stenosis.   Right lower extremity:  The right common femoral artery is widely  patent.  The right profunda femoris artery is widely patent.  The right  superficial femoral artery is patent proximally; however, if it becomes  occluded in the upper thigh.  Total of this occlusion is approximately  18 cm.  There is reconstitution from profunda and superficial femoral  collaterals just distal to the adductor hiatus.  The popliteal artery is  patent without stenosis.  There is  3-vessel runoff to the foot.   INTERVENTION:  After above images were obtained, a decision was made to  intervene.  Rosen wire was placed.  The Omni flush catheter and 5-French  sheath were removed.  A 7-French Terumo sheath was then placed in the  right common femoral artery.  Next, the patient was given systemic  heparinization.  The right superficial femoral artery was selected using  a Glidewire and a quick-cross catheter.  Subintimal recanalization was  performed over the 18-cm segment of occluded superficial femoral artery.  Reentry was performed with the wire.  Contrast injection was performed  to confirm successful reentry.  Next, a Rosen wire was also placed.  A  Glidewire and quick-cross catheter were removed.   Subintimal tract was  dilated using a 4-mm balloon.  Next, a EV3 6 x 150 stent was placed with  this distal end of the popliteal artery.  This was deployed and then  moulded to confirmation with a 6-mm balloon.  This arteriogram was  performed on the proximal aspect of the lesion.  A 6 x 60 EV3 stent was  then placed with approximately 1-cm overlap.  This was followed with  confirmation with a 6-mm balloon.  Final arteriogram was then performed,  which showed widely patent superficial femoral artery with 3-vessel  runoff.  Next, the [facility] wire was removed under direct vision and placed  into the abdominal aorta.  A retrograde left femoral artery angiogram  was performed by closure and found to be adequate.  A [employer] was then  successfully deployed.  The patient tolerated the procedure well.  She  was taken to recovery room in stable condition.   IMPRESSION:  1. An 18-cm right superficial femoral artery occlusion with successful      subintimal recanalization, subsequent right popliteal and right      superficial femoral artery stenting.  2. Three-vessel runoff.  3. No inflow disease.           ______________________________  V. Charlena Cross, MD  Electronically Signed     VWB/MEDQ  D:  12/29/2007  T:  12/29/2007  Job:  [id number]

## 2011-05-11 NOTE — Procedures (Signed)
BYPASS GRAFT EVALUATION   INDICATION:  Follow up right SFA and popliteal stent.   HISTORY:  Diabetes:  Yes.  Cardiac:  Yes.  Hypertension:  Yes.  Smoking:  Yes.  Previous Surgery:  Please see above.   SINGLE LEVEL ARTERIAL EXAM                               RIGHT              LEFT  Brachial:                    112                119  Anterior tibial:             118                84  Posterior tibial:            133                80  Peroneal:  Ankle/brachial index:        1.12               0.71   PREVIOUS ABI:  Date: 01/25/2008  RIGHT:  0.98  LEFT:  0.78   LOWER EXTREMITY BYPASS GRAFT DUPLEX EXAM:   DUPLEX:  Patent right SFA and popliteal stent with no evidence of focal  stenosis.   IMPRESSION:  1. Patent right superficial femoral artery and popliteal stent with no      evidence of focal stenosis.  2. Normal ankle brachial index with biphasic Doppler waveform noted in      the right leg.  3. Moderately abnormal ankle brachial indices with biphasic Doppler      waveform noted in the left DPA.     ___________________________________________  V. Charlena Cross, MD   MG/MEDQ  D:  02/03/2009  T:  02/03/2009  Job:  [id number]
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
## 2011-05-11 NOTE — Procedures (Signed)
BYPASS GRAFT EVALUATION   INDICATION:  Follow up right lower extremity revascularization.   HISTORY:  Diabetes:  Yes.  Cardiac:  Yes.  Hypertension:  Yes.  Smoking:  Yes.  Previous Surgery:  Right SFA/popliteal artery stent 12/29/2007 by Dr.  Myra Gianotti.   SINGLE LEVEL ARTERIAL EXAM                               RIGHT              LEFT  Brachial:                    152                154  Anterior tibial:             152                132  Posterior tibial:            140                124  Peroneal:  Ankle/brachial index:        0.98               0.86   PREVIOUS ABI:  Date: 01/22/2008  RIGHT:  0.93  LEFT:  0.87   LOWER EXTREMITY BYPASS GRAFT DUPLEX EXAM:   DUPLEX:  Doppler arterial waveforms appear borderline triphasic/biphasic  in imaged areas with no evidence of restenosis in stents.   IMPRESSION:  1. Patent right lower extremity stents.  2. ABI appears stable from previous study.   ___________________________________________  V. Charlena Cross, MD   AS/MEDQ  D:  10/10/2008  T:  10/10/2008  Job:  [id number]

## 2011-05-14 NOTE — Discharge Summary (Signed)
[place]. [facility]  Patient:    [name] [name], [name] [name]                      MRN: [id number] Adm. Date:  [id number] Disc. Date: [id number] Attending:  Garnette Scheuermann Dictator:   Melba Coon, M.D. CC:         Milana Huntsman, M.D.   Discharge Summary  DISCHARGE DIAGNOSES: 1. New onset diabetes mellitus type 2. 2. Hypertension. 3. Gastroesophageal reflux disease. 4. Asthma - chronic obstructive pulmonary disease.  DISCHARGE MEDICATIONS: 1. Prempro one tablet daily. 2. Albuterol inhaler two puffs q.6h. p.r.n. 3. Hydrochlorothiazide 25 mg 1/2 tablet daily. 4. Pepcid 20 mg one tablet p.o. q.d. 5. Singulair 10 mg one tablet p.o. q.h.s. 6. Altace 5 mg one tablet p.o. q.d. 7. Glucophage 500 mg one tablet p.o. b.i.d. with food. 8. Advair diskus 100/50 one puff b.i.d.  HISTORY OF PRESENT ILLNESS:  Please refer to the admission H&P for more detailed admission history.  Briefly, [name] [name] is a 65 year old African-American female, patient of Milana Huntsman, M.D. at [facility], who presented to the [facility] emergency department on the night of June with mental status changes.  Her family reports that she had been sick the day prior to admission with nausea and vomiting unable to keep anything down.  She had recently been discharged from the hospital a couple of weeks ago with a COPD exacerbation and saw Dr. Candee Furbish two days prior to admission for a hospital follow-up and had received a breathing treatment as well as antibiotics secondary to a questionable sinusitis/bronchitis.  She woke up on the morning of admission and the family states that she was disoriented and started to have the "shakes" around 10 a.m. that morning.  No loss of bladder or bowel function, no trauma.  The patient has never had anything like this before.  She started to have some abdominal pain the day prior to admission coupled with  her nausea and vomiting.  She was unable to keep any food down.  No sick contacts.  Positive for cough.  REVIEW OF SYSTEMS:  No fever or chills.  Positive for nausea and vomiting.  No palpitations.  No shortness of breath.  Positive cough, positive abdominal pain, positive vomiting.  No skin rashes.  Positive mental status changes. Positive disorientation the morning of admission.  Positive joint pain.  No visual changes.  The patients family reports increased polyuria and polydypsia.  No dysuria.  PAST MEDICAL HISTORY:  Asthma, tobacco abuse, GERD, fibromyalgia and fibromyositis, status post right CVA, status post craniotomy secondary to meningioma.  SOCIAL HISTORY:  The patient has been married 28 years and has worked at [employer] x 16 years.  She quit smoking approximately two weeks ago during her last hospitalization for COPD/asthma exacerbation.  She denies any alcohol use and she has three children.  FAMILY HISTORY:  Positive for diabetes, positive for strokes.  No history of MI or cancer.  One brother with diabetes, one brother with hypertension.  ALLERGIES:  ASPIRIN, CAFFEIN, ORPHENADRINE.  PHYSICAL EXAMINATION:  VITAL SIGNS: The patient was afebrile with a blood pressure of 191/105, saturating 98% on room air.  She was very somnolent and occasionally responded to commands.  HEENT: Her pupils were equal, round, and reactive, but they were slightly muddy in appearance.  LUNGS: She had decreased breath sounds with poor respiratory effort.  HEART: Regular rate and rhythm without murmurs,  rubs, or gallops.  EXTREMITIES: Mild clubbing in her extremities.  RECTAL: The patient was hemoccult negative on examination.  LABORATORY DATA:  White blood cell count 15.8, hemoglobin 15.4, hematocrit 45.3, platelets 355, MCV 86.4, ANC 9.8, sodium 139, potassium 4.0, chloride 98, bicarb 30, BUN 31, creatinine 1.4, glucose 536.  Calcium 10.4, total protein 7.5, albumin 4.0, AST 19, ALT 39,  alkaline phosphatase 189, total bilirubin 0.7, amylase 35, lipase 23.  Urine; specific gravity of 1.040, greater than 1,000 glucose, protein 100, leukocyte esterase and nitrate negative, micro negative.  Urine drug screen positive for tricyclics.  Head CT revealed remote infarct on the left, but no acute changes.  Positive sinus inflammatory changes.  The patient was admitted to the family practice teaching service for further workup.  HOSPITAL COURSE:  #1 - Hyperosmolar state, new onset diabetes.  I suspect the patient has had a history of longstanding high glucoses.  Hemoglobin A1c was 9.9 and in reviewing her records, her hemoglobin last hospitalization was anywhere from 150 to almost 300.  She did have excessive nausea and vomiting with volume contraction which probably increased her blood glucose.  She was also on steroid taper secondary to her asthma exacerbation which could have increased the likelihood of her having elevated blood sugars.  Her TSH was normal at 1.4, her cardiac enzymes were normal as well as her EKG.  She was given aggressive fluid hydration and blood cultures and urine were checked to rule out source of infection.  Her blood cultures remained negative as did her urine. She was empirically started on Rocephin.  The patient had good results with aggressive hydration.  Her sensorium cleared and her blood sugars continued to decrease.  She was given aggressive diabetic education as an inpatient.  She was also taught how to use a glucometer and instructed to check her sugars q.a.c. and q.h.s.  She was started on Glucophage 500 mg q.d. She will be discharged home on Glucophage 500 mg b.i.d.  I suspect that this will have to be probably be increased to 850 mg b.i.d. to start seeing an effect.  Her blood sugars remained in the low to mid 200s and she occasionally received some sliding scale insulin.  She was not sent home with insulin as I suspect her sugars have been  elevated for quite some time and the Glucophage  should start to have an effect at an increased dose in stimulating receptors. She should be given the opportunity to follow-up with diabetic nutrition as an outpatient.  Recommend checking her blood sugars and increasing her Glucophage at her next outpatient visit.  #2 - Hypertension.  The patient stayed quite normotensive on the ACE inhibitor alone.  She will be discharged back home on the ACE inhibitor as well as half of her hydrochlorothiazide dose.  Blood pressure on day of discharge was 115/75.  #3 - GERD.  The patient will be continued on her Pepcid as an outpatient. During hospitalization she was given Protonix with good success.  #4 - Asthma/COPD.  I suspect that this patient most likely has chronic obstructive pulmonary disease.  She was started on Advair diskus one puff twice a day.  The patient did have peak flows ranging from 130 to 140.  She will also be continued on her Singulair as an outpatient.  CONDITION ON DISCHARGE:  Good.  DISPOSITION:  Discharged to home.  DISCHARGE MEDICATIONS:  As per above.  DISCHARGE INSTRUCTIONS:  The patient was given specific instructions to follow a  diabetic diet, low in sugar.  She was also given special instructions to take her medications as prescribed and checking her blood sugars before meals and at bedtime and recording these numbers for her primary physician.  FOLLOW-UP:  The patient will follow up with Dr. Candee Furbish in the next couple of weeks.  The patient voiced agreement and understanding of the above discharge plan and had no further questions. DD:  06/07/01 TD:  06/07/01 Job: [id number]

## 2011-05-14 NOTE — Op Note (Signed)
NAME:  [name] [name], [name] [name]                         ACCOUNT NO.:  [id number]   MEDICAL RECORD NO.:  [id number]                   PATIENT TYPE:  AMB   LOCATION:  ENDO                                 FACILITY:  [facility]   PHYSICIAN:  John [name]. Madilyn Fireman, M.D.                 DATE OF BIRTH:  03/03/1946   DATE OF PROCEDURE:  02/20/2004  DATE OF DISCHARGE:                                 OPERATIVE REPORT   PROCEDURE:  Esophagogastroduodenoscopy with esophageal dilatation.   INDICATIONS FOR PROCEDURE:  Dysphagia or postprandial vomiting.   DESCRIPTION OF PROCEDURE:  The patient was placed in the left lateral  decubitus position then placed on the pulse monitor with continuous low flow  oxygen delivered by nasal cannula. She was sedated with 50 mcg IV fentanyl  and 5 mg IV Versed. The Olympus video endoscope was advanced under direct  vision into the oropharynx and esophagus. The esophagus was diffusely  dilated especially distally with an abrupt taper into the LES which was not  seen to open during the procedure.  This was suspicious for achalasia.  There was no retained food in the esophagus.  The Z line was intact at the  level of the lower esophageal sphincter.  There was minimal resistance to  passage of the scope into the stomach.  There was a significant amount of  semisolid to mostly liquid particles in the proximal stomach and this was  all suctioned.  Retroflexed view of the cardia was unremarkable.  The  fundus, body, antrum and pylorus all appeared normal. The duodenum was  entered and both the bulb and second portion are well inspected and appear  to be within normal limits. A Savary guidewire was placed through the  endoscope channel and the scope withdrawn.  Savary dilators of 17 and 18 mm  were passed over the guidewire with minimal resistance but significant blood  seen on the last dilator only. The last dilator was removed together with  the wire and the patient returned to the recovery  room in stable condition.  She tolerated the procedure well and there were no immediate complications.   IMPRESSION:  Dilated esophagus with suggestion of hypertensive lower  esophageal sphincter by endoscopic appearance suspicious or achalasia versus  actual stricture.   PLAN:  Advance diet and observe response to dilatation.  If she does not get  relief as I suspect will need formal workup for achalasia.                                               John [name]. Madilyn Fireman, M.D.    JCH/MEDQ  D:  02/20/2004  T:  02/20/2004  Job:  [id number]   cc:   Sibyl Parr. Darrick Penna, M.D.  Fax: [phone]

## 2011-05-14 NOTE — H&P (Signed)
[place]. [facility]  Patient:    [name] [name], [name] [name]                      MRN: [id number] Adm. Date:  [id number] Attending:  Sanjuana Letters Dictator:   Dorcas Mcmurray, M.D.                         History and Physical  PROBLEM LIST: 1. Asthma exacerbation. 2. Poor compliance with asthma regimen. 3. Tobacco abuse. 4. Hypertension. 5. Gastroesophageal reflux disease. 6. Fibromyalgia/fibromyositis.  HISTORY OF PRESENT ILLNESS:  This is a 65 year old female with a history of asthma who presents to the walk-in clinic at the family practice center this morning secondary to severe respiratory distress.  She reports she ran out of her "asthma medications" about a month ago and then began developing some URI symptoms this week (these include a cough, rhinorrhea, and postnasal drip) and developed a progressive increasing shortness of breath, until this morning when she came in to be seen.  PAST MEDICAL HISTORY:  As above.  CURRENT MEDICATIONS:  (The patient is not taking any of them at this time). 1. Advair Diskus 250/50 one puff b.i.d. 2. Albuterol MDI with spacer. 3. Flexeril 10 t.i.d. p.r.n. 4. HCTZ 25 q.d. 5. Pepcid 20 q.d.  REVIEW OF SYSTEMS:  She has not smoked any cigarettes x 3 days.  Denies any fever, chills, or weight loss.  Denies any chest pain.  Denies any GI symptoms or any GU complaints.  She does report she needs glasses.  ALLERGIES:  ASPIRIN, CAFFEINE, and ORPHENADRINE cause stomach distress.  PAST MEDICAL HISTORY:  Surgery for a brain tumor and a right CVA.  Seen in the emergency department in December 2001 for asthma exacerbation.  SOCIAL HISTORY:  She smokes 1/2 pack a day for 21 years.  She lives with her husband, daughter, and granddaughter; all of whom smoke.  She is working on smoking cessation at this time.  FAMILY HISTORY:  Diabetes.  PHYSICAL EXAMINATION:  VITAL SIGNS:  Blood pressure 142/102, temperature 96.8, weight 160  pounds, saturating at 93% on room air.  GENERAL:  This is a severely dyspneic patient who is unable to speak.  She is coughing incessantly at the initial visit.  She does appear alert and oriented.  After an albuterol treatment, she is able to speak a few words at a time and is saturating at 92%.  She is alert and oriented x 4.  HEENT:  Oropharynx is clear.  Conjunctivae show muddy sclerae.  NECK:  Supple.  LUNGS:  Diffuse inspiratory and expiratory polyphonic wheezes and poor air movement.  HEART:  Regular rate and rhythm in the 90s.  ABDOMEN:  Soft with minimal tenderness in the epigastrium and nondistended. No guarding and no rebound.  EXTREMITIES:  No cyanosis or edema.  Minimal clubbing of her fingers.  After treatments x 3 in the office, she is saturating at about 92% but still having poor air movement, also is feeling somewhat lightheaded and looks mildly, clinically dehydrated.  ASSESSMENT AND PLAN: 1. Asthma exacerbation.  We will admit for at least 23-hour observation and    give her systemic steroids, scheduled nebulizer. 2. Check chest x-ray, CBC, and BMET. 3. We will give some IV fluids, as she looks mildly dehydrated. 4. Discuss with the family practice team. DD:  05/19/01 TD:  05/20/01 Job: [id number]

## 2011-05-14 NOTE — Cardiovascular Report (Signed)
[place]. [facility]  Patient:    [name] [name], [name] [name] Visit Number: [id number] MRN: [id number]          Service Type: CAT Location: [facility] In[facility] [id number] Attending Physician:  Ricki Rodriguez Dictated by:   Ricki Rodriguez, M.D. Proc. Date: 03/23/02 Admit Date:  03/23/2002 Discharge Date: 03/23/2002                          Cardiac Catheterization  PROCEDURES: Left heart catheterization, selective coronary angiography and left ventricular function study.  INDICATION: Recurrent chest pain in a 65 year old female with risk factors of smoking and hypertension.  APPROACH: Right femoral artery using 6 French diagnostic catheters.  COMPLICATIONS: None.  HEMODYNAMIC DATA: The left ventricular pressure is 195/21 and aortic pressure is 195/109.  LEFT VENTRICULOGRAM: The left ventriculogram showed a normal left ventricular systolic function with ejection fraction of 60%. No hard copy obtained.  CORONARY ANATOMY: Left main: The left main coronary artery was short and unremarkable.  Left left anterior descending: Left anterior descending coronary artery was also unremarkable.  Left circumflex: The left circumflex coronary artery was dominant and had ______ small obtuse marginal branches.  Right coronary artery: The right coronary artery was small and unremarkable.  IMPRESSION: 1. Normal coronary arteries. 2. Hypertension. 3. Noncardiac chest pain.  RECOMMENDATIONS: This patient will have a noncardiac chest pain evaluation. Dictated by:   Ricki Rodriguez, M.D. Attending Physician:  Ricki Rodriguez DD:  03/23/02 TD:  03/24/02 Job: [id number]

## 2011-05-14 NOTE — H&P (Signed)
[place]. [facility]  Patient:    [name] [name], [name] [name]                      MRN: [id number] Adm. Date:  [id number] Attending:  Garnette Scheuermann Dictator:   Pricilla Holm, M.D.                         History and Physical  SERVICE:  Conservation officer, historic buildings.  ATTENDING:  Dr. Royal Hawthorn B. Fields.  PRIMARY PHYSICIAN:  Dr. Zella Ball, [facility].  CHIEF COMPLAINT:  Nausea, vomiting, and shaking, with mental status changes.  HISTORY OF PRESENT ILLNESS:  Patient is a 65 year old African-American female patient of Dr. Clinton Gallant at [facility], who presents to the [facility] emergency room on June 04, 2001 complaining of feeling sick the day before admission with nausea and vomiting.  Husband is giving the history of present illness secondary to patient being unresponsive.  Husband states that she has been unable to keep anything down, saw Dr. Candee Furbish Friday for hospital follow-up for which patient had been admitted for COPD exacerbation. During that visit with Dr. Candee Furbish patient received a breathing treatment and antibiotics for suspected sinusitis.  Husband states that the patient woke this morning and that she was disoriented and started to have "the shakes" around 10 a.m.  No loss of bladder or bowel function, no trauma.  Patient has never had this happen before.  She also started to have some abdominal pain yesterday as well, with nausea and vomiting, unable to keep anything down.  No sick contacts, positive cough.  REVIEW OF SYSTEMS:  No fever, chills.  Positive for nausea and vomiting.  No palpitations, no shortness of breath.  Positive cough.  Positive abdominal pain and vomiting.  No rashes.  Positive mental status changes.  Positive disorientation this morning.  Positive joint pain.  No visual changes.  No endocrinology changes.  No dysuria.  PAST MEDICAL HISTORY: 1. Tobacco abuse. 2. Asthma. 3.  GERD. 4. Fibromyalgia, fibromyositis. 5. History of brain tumor, specifically a meningioma, status post removal of    left vertex extraaxial mass in 1988. 6. Right CVA.  MEDICATIONS:  Prempro, albuterol, Flexeril, hydrochlorothiazide, Pepcid, amitriptyline, prednisone, Singulair, Altace, Diflucan, and Keflex.  SOCIAL HISTORY:  Patient has been smoking half-a-pack per day x 21 years.  She quit smoking two weeks ago after her hospitalization.  She lives with her husband, daughter, and granddaughter.  All adults in the house smoke.  She has been married 28 years.  She has worked at [employer] x 16 years.  No alcohol. Patient has three children.  FAMILY HISTORY:  Diabetes, strokes.  No MIs or cancers.  One brother with diabetes, one brother with hypertension.  ALLERGIES:  ASPIRIN, CAFFEINE, ORPHENADRINE.  PHYSICAL EXAMINATION:  VITAL SIGNS:  Temperature 97.6, blood pressure 191/105, respirations 20, pulse 128, saturation 98% on room air.  GENERAL:  This is a somnolent 65 year old black female, lying on a stretcher in occasional mild abdominal distress.  She occasionally responds to commands.  HEENT:  Head normocephalic, atraumatic.  Pupils equal, round, and reactive to light and accommodation bilaterally.  Extraocular movements intact.  No scleral icterus, although muddy.  Ears reveal TMs clear with normal cone of light.  Nares patent without discharge.  Oropharynx slightly dry mucous membranes with slightly poor dentition.  NECK:  Supple without lymphadenopathy.  LUNGS:  Decreased breath sounds with  poor inspiratory effort.  No crackles appreciated.  HEART:  Regular rate and rhythm without murmurs, rubs, or gallops.  No lower extremity edema, 2+ capillary refill.  ABDOMEN:  Slightly tender in epigastric area, positive bowel sounds. Well-healed abdominal scar present.  No hepatosplenomegaly appreciated.  EXTREMITIES:  Mild clubbing of digits.  No cyanosis or edema  appreciated.  NEUROLOGIC:  Unable to assess cranial nerves.  Decreased strength on left side secondary to stroke, per husband.  SKIN:  No rashes.  RECTAL:  Tender with heme negative.  LABORATORY DATA:  White blood cell count 15.8, hemoglobin 15.4, hematocrit 45.3, platelets 355, MCV 86.4, ANC 9.8.  Sodium 139, potassium 4.0, chloride 98, bicarb 30, BUN 31, creatinine 1.4, glucose 536, calcium 10.4, total protein 7.5, albumin 4.0, AST 10, ALT 39, alk phos 189, total bili 0.7, amylase 35, lipase 23.  Urine specific gravity 1.040, greater than 1000 glucose, protein 100, leukocyte esterase and nitrate negative.  Micro negative.  UDS positive for tricyclics.  Head CT:  Remote infarct on the left, no acute changes.  Sinus inflammatory changes.  ASSESSMENT AND PLAN:  A 65 year old black female with mental status changes, nausea and vomiting, increased blood sugars, admitted for further workup.  1. Mental status changes.  I suspect that this is secondary to nausea and    vomiting and volume contraction with stress response increased blood    sugars.  She has improved and her mental status has slightly cleared    with fluids and insulin.  Her corrected sodium is 146.  We will continue    with fluid hydration gently and continue to monitor her.  We will also    check blood cultures, TSH, and cardiac enzymes x 1.  Will also check an    ABG to rule out increased CO2 retention secondary to her lung disease.    We will admit her to a step-down unit in telemetry for close monitoring.    Also put her on seizure precautions. 2. Increased capillary blood glucose.  I suspect that this could be a stress    response versus underlying diabetes.  Patient does take prednisone.  Will    also check a hemoglobin A1c. 3. Hypertension.  I will continue her on her Altace and restart her    hydrochlorothiazide after her fluid intake.  Continue to monitor this. 4. Abdominal pain.  I suspect this could be  secondary to her increased CBGs    versus peptic ulcer disease.  We will readdress her abdominal pain after     correcting her metabolic abnormalities.  I suspect this also could be viral    gastroenteritis.  She does have an elevated alk phos, though. 5. Gastroesophageal reflux disease.  Continue her proton pump inhibitor.    Questionable need for GI workup after we correct her metabolic abnormality.    Again, suspect that there could be underlying viral gastroenteritis. 6. Chronic obstructive pulmonary disease.  Continue on her albuterol and    Singulair.  Monitor her O2 saturations and augment this regimen as needed. DD:  06/04/01 TD:  06/05/01 Job: [id number]

## 2011-05-14 NOTE — Cardiovascular Report (Signed)
NAME:  [name] [name], [name] [name]               ACCOUNT NO.:  [id number]   MEDICAL RECORD NO.:  [id number]          PATIENT TYPE:  OIB   LOCATION:  2899                         FACILITY:  [facility]   PHYSICIAN:  Ricki Rodriguez, M.D.  DATE OF BIRTH:  Apr 29, 1946   DATE OF PROCEDURE:  04/05/2007  DATE OF DISCHARGE:                            CARDIAC CATHETERIZATION   PROCEDURE:  Left heart catheterization, selective coronary angiography,  left ventricular function study.   APPROACH:  Right femoral artery using 4-French sheath and catheters.  No  complications.   INDICATION:  This 65 year old black female with risk factors of  hypertension, diabetes, smoking had abnormal stress test and recurrent  chest pain.   HEMODYNAMIC DATA:  The left ventricular pressure was 170/14 and aortic  pressure was 165/86.   CORONARY ANATOMY:  The left main coronary artery was double-barrel and  unremarkable.   Left anterior descending coronary artery:  The left anterior descending  coronary artery was a large vessel and was unremarkable.  It wrapped  around the apex of the heart supplying more than 50% of the posterior  septum.  Its diagonal one branch was unremarkable.   Left circumflex coronary artery:  The left circumflex coronary artery  had proximal and mid vessel luminal irregularities with a 10-20% mid  vessel narrowing.  Its obtuse marginal branch one was a small vessel and  obtuse marginal branch two was a large vessel.  It had a small posterior  descending coronary artery.   Right coronary artery:  The right coronary artery was nondominant and  was a small vessel, and had a four small marginal branches uniting to  form a larger vessel supplying the inferior wall.   LEFT VENTRICULOGRAM:  The left ventriculogram was normal with ejection  fraction of 70%.   IMPRESSION:  1. Minimal coronary artery disease.  2. Normal left systolic function.   RECOMMENDATIONS:  This patient will continue medical therapy  and was  strongly advised to discontinue smoking.      Ricki Rodriguez, M.D.  Electronically Signed     ASK/MEDQ  D:  04/05/2007  T:  04/05/2007  Job:  [id number]

## 2011-05-14 NOTE — Op Note (Signed)
. [facility]  Patient:    [name] [name], LAPAGLIA Visit Number: [id number] MRN: [id number]          Service Type: DSU Location: [phone] Attending Physician:  Brandy Hale Dictated by:   Angelia Mould. Derrell Lolling, M.D. Proc. Date: 04/11/02 Admit Date:  04/11/2002   CC:         Dr. Burley Saver [facility]   Operative Report  PREOPERATIVE DIAGNOSIS:  Chronic cholecystitis with cholelithiasis.  POSTOPERATIVE DIAGNOSIS:  Chronic cholecystitis with cholelithiasis.  OPERATION PERFORMED:  Laparoscopic cholecystectomy with intraoperative cholangiogram.  SURGEON:  Angelia Mould. Derrell Lolling, M.D.  ASSISTANT:  Rose Phi. Maple Hudson, M.D.  ANESTHESIA:  INDICATIONS FOR PROCEDURE:  The patient is a 65 year old black female with non-insulin-dependent diabetes, hypertension, coronary artery disease.  She has had a one-week history of intermittent episodes of midepigastric right flank and back pain with some nausea and vomiting.  She was seen in the [facility] emergency room on Saturday and Sunday earlier this week.  An ultrasound of the gallbladder shows multiple gallstones, thickening of the gallbladder wall and probable common bile duct stones.  Her liver function tests are actually normal.  Her lipase is normal.  Her white blood cell count is 11,300.  She is brought to the operating room semielectively for cholecystectomy.  OPERATIVE FINDINGS:  The gallbladder was thick-walled, chronically inflamed and slightly acutely inflamed.  The anatomy of the cystic duct, cystic artery, common bile duct and intrahepatic bile ducts were normal.  She had lots of adhesions from the dome of the liver to the undersurface of the diaphragm suggesting previous inflammatory diseases.  She had some adhesions of her omentum to the lower midline and these adhesions were taken down.  There was no focal abnormality of the stomach, duodenum, large  intestine, small intestine or pelvis that I could detect.  DESCRIPTION OF PROCEDURE:  Following the induction of general endotracheal anesthesia, the patients abdomen was prepped and draped in a sterile fashion. 0.5% Marcaine with epinephrine was used as a local infiltration anesthetic.  A vertically oriented incision was made inside the upper rim of the umbilicus. The fascia was incised in the midline and the abdominal cavity entered under direct vision.  A 10 mm Hasson trocar was inserted and secured with a pursestring suture of 0 Vicryl.  Pneumoperitoneum was created.  The video camera was inserted with visualization and findings as described above.  A 10 mm trocar was placed in the subxiphoid region and two 5 mm trocars were placed in the right and midabdomen.  We took adhesions down and placed the gallbladder on traction.  We further took adhesions down from the infundibulum of the gallbladder.  We identified the cystic duct and cystic artery and isolated these structures.  The cystic artery was isolated as it went onto the gallbladder.  It was controlled with metal clips and divided.  The cystic duct was secured with a metal clip close to the gallbladder.  A cholangiogram catheter was inserted into the cystic duct.  Cholangiogram was obtained using a C-arm.  This was completely normal.  She had normal intrahepatic and extrahepatic bile ducts, prompt flow of contrast into the duodenum without obstruction and no filling defects.  The biliary tree was not dilated.  The cholangiogram catheter was then removed.  The cystic duct was secured with metal clips and divided.  The gallbladder was dissected from its bed with electrocautery and removed.  The operative field  was copiously irrigated. There was no bleeding and no bile leak whatsoever at the completion of the case.  We took down some of the omental adhesions in the lower midline under direct vision.  After reviewing the areas of  dissection, there was no bleeding.  We removed the trocars under direct vision and there was no bleeding from the trocar sites.  The pneumoperitoneum was released.  The fascia at the umbilicus was closed with 0 Vicryl sutures and the skin incision closed with subcuticular sutures of 4-0 Vicryl and Steri-Strips.  Clean bandages were placed.  The patient was taken to the recovery room in stable condition.  Estimated blood loss was about 10 cc.  Complications were none. Sponge, needle and instrument counts were correct. Dictated by:   Angelia Mould. Derrell Lolling, M.D. Attending Physician:  Brandy Hale DD:  04/11/02 TD:  04/11/02 Job: [phone] [id number]

## 2011-05-14 NOTE — Discharge Summary (Signed)
[place]  Patient:    [name] [name], [name] [name]                      MRN: [id number] Adm. Date:  [id number] Disc. Date: [id number] Attending:  Sanjuana Letters Dictator:   Zella Ball, M.D. CC:         [facility]. [facility]   Discharge Summary  DATE OF BIRTH:  26-Feb-1946  DISCHARGE DIAGNOSES: 1. Asthma, acute exacerbation on moderate persistent. 2. Hypertension. 3. Gastroesophageal reflux disease. 4. Chronic pain, fibromyalgia. 5. Tobacco abuse. 6. Poor compliance.  ALLERGIES:  ASPIRIN, CAFFEINE, ORPHENADRINE.  DISCHARGE MEDICATIONS: 1. Prednisone 60 mg p.o. q.d. x 5 days. 2. Flovent 220 mcg two puffs b.i.d. 3. Albuterol MDI one to two puffs q.4-6h. p.r.n. 4. Albuterol nebulizer 5 mg per 6 cc one nebulizer q.4h. p.r.n. 5. Singulair 10 mg p.o. q.h.s. 6. Hydrochlorothiazide 25 mg p.o. q.d. 7. Pepcid 20 mg p.o. q.d. 8. Flexoril 10 mg t.i.d. p.r.n. pain. 9. Altace 2.5 mg p.o. q.d.  FOLLOW-UP:  The patient is to follow up with Dr. Candee Furbish at [facility] on June 6, at 8:45 a.m.  CONSULTING PHYSICIANS:  None.  PROCEDURE:  None.  HISTORY OF PRESENT ILLNESS:  This is a 65 year old female with a history of asthma who presented to the walk-in clinic at the [facility] on day of presentation secondary to severe respiratory distress.  The patient states that she ran out of asthma medications about a month ago and then had been developing some URI type symptoms, cough, rhinorrhea, and postnasal drip, and then just started developing really increasing shortness of breath.  The patient also states that over the past month, she has lost her home.  She, her husband, and the two grandsons that they are looking after are currently living in a hotel and are just having severe financial difficulty all around.  SOCIAL HISTORY:  The patient has smoked 1/2 pack a day for 21 years.  She states she is  working on smoking cessation at this time.  PHYSICAL EXAMINATION:  VITAL SIGNS: Blood pressure 142/102, temperature 96.8, weight 160 pounds.  O2 saturations are 93% on room air.  Physical examination is generally severely dyspneic.  The patient who is unable to speak secondary to her shortness of breath.  She is coughing incessantly.  Pulmonary examination is significant for diffuse inspiratory and expiratory wheezes, very poor air movement.  LABORATORY DATA:  White count 12.6 with 60% neutrophils, 31% lymphocytes, hemoglobin 14.3, platelets of 338.  Sodium 139, potassium 3.7, chloride 104, bicarb 28, BUN 9, creatinine 1.0, glucose 141, and calcium of 9.6.  Initial chest x-ray was normal.  Assessment at that time was this is a patient with known asthma history who appeared to be in an asthma exacerbation.  Therefore, she was admitted to the hospital for management of this acute asthma exacerbation.  HOSPITAL COURSE:  The patient was admitted to the hospital and begun on IV Solu-Medrol 120 mg IV q.12h. as well as albuterol nebulizers and then her previous medications of Advair diskus and also Atrovent.  She improved somewhat with this therapy, although, she did not require oxygen at any point in her examination remained fairly limited.  On the day after presentation she was only blowing about 150 to 200 on her peak flow meter.  However, symptomatically she did improve to the point where she could speak in full sentences and breathing normally.  However, did continue to have fairly significant inspiratory and expiratory wheezes.  Two days after presentation, the patient developed somewhat high blood pressures up into the 190s/125 and as the patient did have a history of meningioma and she informed the crosscover physicians that last time she did have this meningioma, it was evidenced by uncontrolled hypertension.  Therefore, it was decided to get a CT of the head for which the impression  was postoperative changes left parietal craniotomy, stable appearance of high left parietal encephalomalacia, no acute abnormality or evidence of recurrent mass.  With the addition of other blood pressure agents, the patients hypertension did come under control.  I questioned whether this might have been related to maybe some of her steroid administration here.  At any rate, on the day of discharge, the patient was normotensive, able to speak in full sentences, and generally doing okay.  It was decided to send the patient home with a nebulizer machine and this was arranged for her prior to discharge from the hospital.  DISPOSITION:  The patient is discharged to home, which actually at this time is a hotel room with her husband and two grandchildren.  She is in stable condition at the time of discharge. DD:  05/22/01 TD:  05/22/01 Job: [id number]

## 2011-05-18 ENCOUNTER — Encounter: Payer: Self-pay | Admitting: Home Health Services

## 2011-05-18 NOTE — Progress Notes (Signed)
I have reviewed this visit and discussed with Suzanne Lineberry and agree with her documentation  

## 2011-06-10 ENCOUNTER — Ambulatory Visit (INDEPENDENT_AMBULATORY_CARE_PROVIDER_SITE_OTHER): Payer: Medicare Other | Admitting: Family Medicine

## 2011-06-10 ENCOUNTER — Encounter: Payer: Self-pay | Admitting: Family Medicine

## 2011-06-10 VITALS — BP 98/60 | HR 73 | Temp 98.4°F | Ht 61.6 in | Wt 151.0 lb

## 2011-06-10 DIAGNOSIS — E119 Type 2 diabetes mellitus without complications: Secondary | ICD-10-CM

## 2011-06-10 DIAGNOSIS — I10 Essential (primary) hypertension: Secondary | ICD-10-CM

## 2011-06-10 DIAGNOSIS — G44219 Episodic tension-type headache, not intractable: Secondary | ICD-10-CM

## 2011-06-10 LAB — POCT GLYCOSYLATED HEMOGLOBIN (HGB A1C): Hemoglobin A1C: 7.1

## 2011-06-10 MED ORDER — BLOOD PRESSURE MONITOR KIT: 1.0000 | PACK | Freq: Every day | Status: DC

## 2011-06-10 NOTE — Patient Instructions (Addendum)
STOP Amlodipine Get your blood pressure monitor and take your blood pressure every day. Write the values down.  Come back in 2 weeks to follow up.  Let me know if it gets really low.  Try tylenol for headaches.

## 2011-06-11 ENCOUNTER — Encounter: Payer: Self-pay | Admitting: Family Medicine

## 2011-06-11 NOTE — Progress Notes (Signed)
BP: Taking medication as directed noted above. Has had several episodes of feeling weak when standing up. No chest pain or SOB or palpitations. Had a recorded low blood pressure in clinic recently.  Headaches: 1.5 months now. Occurs in the posterior to left side. Occurs almost every day. Some times the pain is throbbing. No N/V. Some blurry vision sometimes. No vertigo. No neuro changes.   Cancer screening: Is due for  -Colonoscopy never had it.  - Last pap 1 yr ago - Last mammogram was over 1 year.  PMH reviewed.  ROS as above otherwise neg  Exam:  Vs noted.  Gen: Well NAD HEENT: EOMI, PERRL, MMM. Clean disc margins. Vessels are thickened and have rails appearance.   Lungs: CTABL Nl WOB Heart: RRR no MRG Abd: NABS, NT, ND Exts: Non edematous BL  LE

## 2011-06-11 NOTE — Assessment & Plan Note (Signed)
BP too low. This is now two measurements with SBPs less than 100.  I think this is due to over medication.  Plan to stop Norvasc and give patient a home BP monitor. Will do BP and symptom log and RTC in 2 weeks.  Red flags given.

## 2011-06-11 NOTE — Assessment & Plan Note (Signed)
I think headaches are due to hypotension as they started around the same time of the restart of the BP medications.  However she does have Dx of CLL. If still present when BP corrected will consider further workup to include neuro-imaging.

## 2011-06-29 ENCOUNTER — Ambulatory Visit: Payer: Medicare Other | Admitting: Family Medicine

## 2011-07-05 ENCOUNTER — Ambulatory Visit: Payer: Medicare Other | Admitting: Family Medicine

## 2011-07-14 ENCOUNTER — Ambulatory Visit (INDEPENDENT_AMBULATORY_CARE_PROVIDER_SITE_OTHER): Payer: Medicare Other | Admitting: Family Medicine

## 2011-07-14 ENCOUNTER — Encounter: Payer: Self-pay | Admitting: Family Medicine

## 2011-07-14 DIAGNOSIS — R079 Chest pain, unspecified: Secondary | ICD-10-CM

## 2011-07-14 DIAGNOSIS — I10 Essential (primary) hypertension: Secondary | ICD-10-CM

## 2011-07-14 NOTE — Patient Instructions (Signed)
Thank you for coming in today. Please make an appointment with Dr. Algie Coffer to follow up your chest pain, racing heart and trouble breathing when you were emotionally upset.  See me in 2-3 months or sooner if something comes up.

## 2011-07-15 ENCOUNTER — Encounter: Payer: Self-pay | Admitting: Family Medicine

## 2011-07-15 DIAGNOSIS — R079 Chest pain, unspecified: Secondary | ICD-10-CM | POA: Insufficient documentation

## 2011-07-15 NOTE — Assessment & Plan Note (Signed)
No more dizzy episodes currently. Unfortunately [name] [name] cannot get a home BP cuff so I do not know where her home BP is. I suspect that she is significantly lower at home than in the office. She is on multi-drug therapy and I expect her to do well.  Will be sending to cardiology for chest pain so I will follow his recommendations on this issue as well.

## 2011-07-15 NOTE — Assessment & Plan Note (Signed)
Concerning for chest pain, racing heart, and SOB during an emotional encounter last week. This to me is a warning sign for angina. Plan to send back to her cardiologist Dr. Algie Coffer. Will likely need stress test. Will follow. No continued chest pain.

## 2011-07-15 NOTE — Progress Notes (Signed)
CHRONIC HYPERTENSION  Disease Monitoring  Blood pressure range: Not checking at home as cannot afford the meter.  Chest pain: yes, had CP and palpitations and SOB during a very emotional encounter last week. None since.    Dyspnea: yes, as above none since   Claudication: no   Medication compliance: yes  Medication Side Effects  Lightheadedness: no, much improved currently   Urinary frequency: no   Edema: no   Impotence: no, N/A   Preventitive Healthcare:  Exercise: no     PMH reviewed.  ROS as above otherwise neg   Exam:  Vs noted.  Gen: Well NAD HEENT: EOMI,  MMM, flat neck veins Lungs: CTABL Nl WOB Heart: RRR no MRG Abd: NABS, NT, ND Exts: Non edematous BL  LE

## 2011-09-27 LAB — BASIC METABOLIC PANEL
BUN: 11
CO2: 30
Calcium: 8.9
Chloride: 104
Creatinine, Ser: 1.26 — ABNORMAL HIGH
GFR calc Af Amer: 52 — ABNORMAL LOW
GFR calc non Af Amer: 43 — ABNORMAL LOW
Glucose, Bld: 114 — ABNORMAL HIGH
Potassium: 3.3 — ABNORMAL LOW
Sodium: 141

## 2011-10-01 LAB — PROTIME-INR
INR: 1
Prothrombin Time: 12.9

## 2011-10-01 LAB — COMPREHENSIVE METABOLIC PANEL
ALT: 34
AST: 23
Albumin: 3.1 — ABNORMAL LOW
Alkaline Phosphatase: 118 — ABNORMAL HIGH
BUN: 5 — ABNORMAL LOW
CO2: 31
Calcium: 9.1
Chloride: 106
Creatinine, Ser: 0.89
GFR calc Af Amer: >60
GFR calc non Af Amer: >60
Glucose, Bld: 146 — ABNORMAL HIGH
Potassium: 3.5
Sodium: 141
Total Bilirubin: 0.5
Total Protein: 6.1

## 2011-10-01 LAB — CBC
HCT: 38.8
Hemoglobin: 12.4
MCHC: 31.9
MCV: 88.7
Platelets: 236
RBC: 4.38
RDW: 15.2
WBC: 16.9 — ABNORMAL HIGH

## 2011-10-01 LAB — APTT: aPTT: 28

## 2011-11-11 ENCOUNTER — Emergency Department (HOSPITAL_COMMUNITY)
Admission: EM | Admit: 2011-11-11 | Discharge: 2011-11-12 | Disposition: A | Discharge status: Home / Self Care | Payer: Medicare Other | Attending: Emergency Medicine | Admitting: Emergency Medicine

## 2011-11-11 DIAGNOSIS — C9110 Chronic lymphocytic leukemia of B-cell type not having achieved remission: Secondary | ICD-10-CM | POA: Insufficient documentation

## 2011-11-11 DIAGNOSIS — I739 Peripheral vascular disease, unspecified: Secondary | ICD-10-CM | POA: Insufficient documentation

## 2011-11-11 DIAGNOSIS — J449 Chronic obstructive pulmonary disease, unspecified: Secondary | ICD-10-CM | POA: Insufficient documentation

## 2011-11-11 DIAGNOSIS — J4489 Other specified chronic obstructive pulmonary disease: Secondary | ICD-10-CM | POA: Insufficient documentation

## 2011-11-11 DIAGNOSIS — I10 Essential (primary) hypertension: Secondary | ICD-10-CM | POA: Insufficient documentation

## 2011-11-11 DIAGNOSIS — R109 Unspecified abdominal pain: Secondary | ICD-10-CM | POA: Insufficient documentation

## 2011-11-11 DIAGNOSIS — E785 Hyperlipidemia, unspecified: Secondary | ICD-10-CM | POA: Insufficient documentation

## 2011-11-11 DIAGNOSIS — K5900 Constipation, unspecified: Secondary | ICD-10-CM | POA: Insufficient documentation

## 2011-11-11 DIAGNOSIS — R110 Nausea: Secondary | ICD-10-CM | POA: Insufficient documentation

## 2011-11-12 ENCOUNTER — Emergency Department (HOSPITAL_COMMUNITY): Payer: Medicare Other

## 2011-11-12 ENCOUNTER — Encounter (HOSPITAL_COMMUNITY): Payer: Self-pay | Admitting: *Deleted

## 2011-11-12 LAB — CBC
HCT: 42.2 % (ref 36.0–46.0)
Hemoglobin: 13.9 g/dL (ref 12.0–15.0)
MCH: 29.2 pg (ref 26.0–34.0)
MCHC: 32.9 g/dL (ref 30.0–36.0)
MCV: 88.7 fL (ref 78.0–100.0)
Platelets: 205 10*3/uL (ref 150–400)
RBC: 4.76 MIL/uL (ref 3.87–5.11)
RDW: 14.5 % (ref 11.5–15.5)
WBC: 14.7 10*3/uL — ABNORMAL HIGH (ref 4.0–10.5)

## 2011-11-12 LAB — URINALYSIS, ROUTINE W REFLEX MICROSCOPIC
Bilirubin Urine: NEGATIVE
Glucose, UA: NEGATIVE mg/dL
Hgb urine dipstick: NEGATIVE
Ketones, ur: NEGATIVE mg/dL
Leukocytes, UA: NEGATIVE
Nitrite: NEGATIVE
Protein, ur: NEGATIVE mg/dL
Specific Gravity, Urine: 1.003 — ABNORMAL LOW (ref 1.005–1.030)
Urobilinogen, UA: 0.2 mg/dL (ref 0.0–1.0)
pH: 6.5 (ref 5.0–8.0)

## 2011-11-12 LAB — POCT I-STAT, CHEM 8
BUN: 5 mg/dL — ABNORMAL LOW (ref 6–23)
Calcium, Ion: 1.2 mmol/L (ref 1.12–1.32)
Chloride: 100 mEq/L (ref 96–112)
Creatinine, Ser: 1 mg/dL (ref 0.50–1.10)
Glucose, Bld: 125 mg/dL — ABNORMAL HIGH (ref 70–99)
HCT: 43 % (ref 36.0–46.0)
Hemoglobin: 14.6 g/dL (ref 12.0–15.0)
Potassium: 3.5 mEq/L (ref 3.5–5.1)
Sodium: 141 mEq/L (ref 135–145)
TCO2: 31 mmol/L (ref 0–100)

## 2011-11-12 LAB — HEPATIC FUNCTION PANEL
ALT: 59 U/L — ABNORMAL HIGH (ref 0–35)
AST: 59 U/L — ABNORMAL HIGH (ref 0–37)
Albumin: 3.6 g/dL (ref 3.5–5.2)
Alkaline Phosphatase: 128 U/L — ABNORMAL HIGH (ref 39–117)
Bilirubin, Direct: <0.1 mg/dL (ref 0.0–0.3)
Total Bilirubin: 0.3 mg/dL (ref 0.3–1.2)
Total Protein: 6.6 g/dL (ref 6.0–8.3)

## 2011-11-12 LAB — LIPASE, BLOOD: Lipase: 45 U/L (ref 11–59)

## 2011-11-12 MED ORDER — ONDANSETRON HCL 4 MG/2ML IJ SOLN
4.0000 mg | Freq: Once | INTRAMUSCULAR | Status: AC
  Administered 2011-11-12: 4 mg via INTRAVENOUS
  Filled 2011-11-12: qty 2

## 2011-11-12 MED ORDER — OXYCODONE-ACETAMINOPHEN 5-325 MG PO TABS
1.0000 | ORAL_TABLET | Freq: Once | ORAL | Status: AC
  Administered 2011-11-12: 1 via ORAL
  Filled 2011-11-12: qty 1

## 2011-11-12 MED ORDER — TRAMADOL HCL 50 MG PO TABS: 50.0000 mg | ORAL_TABLET | Freq: Four times a day (QID) | ORAL | Status: DC | PRN

## 2011-11-12 MED ORDER — SODIUM CHLORIDE 0.9 % IV BOLUS (SEPSIS)
500.0000 mL | Freq: Once | INTRAVENOUS | Status: AC
  Administered 2011-11-12: 500 mL via INTRAVENOUS

## 2011-11-12 MED ORDER — IOHEXOL 300 MG/ML  SOLN
100.0000 mL | Freq: Once | INTRAMUSCULAR | Status: AC | PRN
Start: 2011-11-12 — End: 2011-11-12
  Administered 2011-11-12: 100 mL via INTRAVENOUS

## 2011-11-12 MED ORDER — FENTANYL CITRATE 0.05 MG/ML IJ SOLN
100.0000 ug | Freq: Once | INTRAMUSCULAR | Status: AC
  Administered 2011-11-12: 100 ug via INTRAVENOUS
  Filled 2011-11-12: qty 2

## 2011-11-12 NOTE — ED Provider Notes (Addendum)
History     CSN: [id number] Arrival date & time: 11/11/2011 11:56 PM   First MD Initiated Contact with Patient 11/12/11 0051      Chief Complaint  Patient presents with  . Abdominal Pain    (Consider location/radiation/quality/duration/timing/severity/associated sxs/prior treatment) Patient is a 65 y.o. female presenting with abdominal pain. The history is provided by the patient. No language interpreter was used.  Abdominal Pain The primary symptoms of the illness include abdominal pain and nausea. The primary symptoms of the illness do not include fever, fatigue, shortness of breath, vomiting, diarrhea, hematemesis, hematochezia, dysuria, vaginal discharge or vaginal bleeding. The current episode started more than 2 days ago. The onset of the illness was gradual. The problem has not changed since onset. Associated with: constipation. The patient has had a change in bowel habit (constipation). Risk factors: none. Additional symptoms associated with the illness include constipation. Symptoms associated with the illness do not include chills, anorexia, diaphoresis, heartburn, urgency, hematuria, frequency or back pain. Significant associated medical issues do not include substance abuse or HIV.  Ate a tuna fish sandwich and pain started after that.  No diarrhea.  No cp, no sob, no n/v/d.  Pain does no radiate.  It is crampy in nature and a 9/10  Past Medical History  Diagnosis Date  . CLL (chronic lymphoblastic leukemia)   . COPD (chronic obstructive pulmonary disease)   . Hyperlipidemia   . Hypertension   . PVD (peripheral vascular disease)     Past Surgical History  Procedure Date  . Brain meningioma excision   . Esophageal dilation   . Cholecystectomy, laparoscopic   . Bypass grafting of rle for pad claudication      Family History  Problem Relation Age of Onset  . Heart disease Mother   . Diabetes Brother     History  Substance Use Topics  . Smoking status: Current  Everyday Smoker -- 0.5 packs/day for 15 years    Types: Cigarettes  . Smokeless tobacco: Never Used   Comment: Pt will reduce cigarettes to just 8 a day.   . Alcohol Use: No    OB History    Grav Para Term Preterm Abortions TAB SAB Ect Mult Living                  Review of Systems  Constitutional: Negative for fever, chills, diaphoresis and fatigue.  HENT: Negative for facial swelling.   Respiratory: Negative for shortness of breath.   Cardiovascular: Negative for chest pain.  Gastrointestinal: Positive for nausea, abdominal pain and constipation. Negative for heartburn, vomiting, diarrhea, blood in stool, hematochezia, anorexia and hematemesis.  Genitourinary: Negative for dysuria, urgency, frequency, hematuria, vaginal bleeding and vaginal discharge.  Musculoskeletal: Negative for back pain.  Skin: Negative.   Neurological: Negative for dizziness.  Hematological: Negative.   Psychiatric/Behavioral: Negative.     Allergies  Aspirin  Home Medications   Current Outpatient Rx  Name Route Sig Dispense Refill  . ALBUTEROL SULFATE HFA 108 (90 BASE) MCG/ACT IN AERS Inhalation Inhale 2 puffs into the lungs at bedtime. and as needed for wheezing or shortness of breath 1 Inhaler 2  . AMLODIPINE BESYLATE 10 MG PO TABS Oral Take 1 tablet (10 mg total) by mouth daily. 90 tablet 4  . BECLOMETHASONE DIPROPIONATE 40 MCG/ACT IN AERS Inhalation Inhale 2 puffs into the lungs at bedtime. 1 Inhaler 11  . CARVEDILOL 6.25 MG PO TABS Oral Take 1 tablet (6.25 mg total) by mouth 2 (two) times  daily. 180 tablet 4  . CILOSTAZOL 50 MG PO TABS Oral Take 50 mg by mouth 2 (two) times daily. Dose per vascular surgeon    . CLOPIDOGREL BISULFATE 75 MG PO TABS Oral Take 1 tablet (75 mg total) by mouth daily. 90 tablet 4  . CYCLOBENZAPRINE HCL 10 MG PO TABS Oral Take 10 mg by mouth 2 (two) times daily as needed. For spasms     . ENALAPRIL MALEATE 20 MG PO TABS Oral Take 1 tablet (20 mg total) by mouth daily.  90 tablet 4  . FLUTICASONE PROPIONATE 50 MCG/ACT NA SUSP Nasal Place 1 spray into the nose daily as needed. Spray into both nostrils, for nasal congestion    . FLUTICASONE-SALMETEROL 500-50 MCG/DOSE IN AEPB Inhalation Inhale 1 puff into the lungs 2 (two) times daily.      Marland Kitchen HYDROCHLOROTHIAZIDE 25 MG PO TABS Oral Take 1 tablet (25 mg total) by mouth daily. 90 tablet 4  . METFORMIN HCL 500 MG PO TABS Oral Take 1 tablet (500 mg total) by mouth 2 (two) times daily. 180 tablet 4  . MONTELUKAST SODIUM 10 MG PO TABS Oral Take 1 tablet (10 mg total) by mouth at bedtime. 90 tablet 4  . SIMVASTATIN 20 MG PO TABS Oral Take 1 tablet (20 mg total) by mouth at bedtime. 90 tablet 4  . TRAMADOL HCL 50 MG PO TABS  Take 2 tabs by mouth twice a day if needed for pain      BP 190/82  Pulse 64  Temp(Src) 98 F (36.7 C) (Oral)  Resp 22  SpO2 97%  Physical Exam  Constitutional: She is oriented to person, place, and time. She appears well-developed and well-nourished.  HENT:  Head: Normocephalic and atraumatic.  Mouth/Throat: Oropharynx is clear and moist. No oropharyngeal exudate.  Eyes: Conjunctivae and EOM are normal. Pupils are equal, round, and reactive to light. Right eye exhibits no discharge. Left eye exhibits no discharge.  Neck: Normal range of motion. Neck supple. No thyromegaly present.  Cardiovascular: Normal rate and regular rhythm.   Pulmonary/Chest: Effort normal and breath sounds normal. She has no wheezes. She has no rales.  Abdominal: Soft. She exhibits no shifting dullness, no distension, no pulsatile liver, no fluid wave and no mass. Bowel sounds are decreased. There is no splenomegaly or hepatomegaly. There is generalized tenderness. There is no rigidity, no rebound and no guarding.       Tenderness is mild  Musculoskeletal: Normal range of motion. She exhibits no edema.  Neurological: She is alert and oriented to person, place, and time.  Skin: Skin is warm and dry. She is not  diaphoretic.  Psychiatric: She has a normal mood and affect.    ED Course  Procedures (including critical care time)  Labs Reviewed  URINALYSIS, ROUTINE W REFLEX MICROSCOPIC - Abnormal; Notable for the following:    Specific Gravity, Urine 1.003 (*)    All other components within normal limits  CBC - Abnormal; Notable for the following:    WBC 14.7 (*)    All other components within normal limits  HEPATIC FUNCTION PANEL - Abnormal; Notable for the following:    AST 59 (*)    ALT 59 (*)    Alkaline Phosphatase 128 (*)    All other components within normal limits  POCT I-STAT, CHEM 8 - Abnormal; Notable for the following:    BUN 5 (*)    Glucose, Bld 125 (*)    All other components within normal  limits  LIPASE, BLOOD  I-STAT, CHEM 8   No results found.   No diagnosis found.    MDM  MDM Reviewed: nursing note and vitals Interpretation: labs and CT scan    Discussed results with the radiologist.  No calcifications or occlusions in the celiac axis.   Follow up with your family doctor.  Return for worsening pain, nausea, vomiting fevers, chills inability to tolerate medications or any concerns.  Patient verbalizes understanding and agrees to follow up      Keilly Fatula K Lindley Stachnik-Rasch, MD 11/12/11 0517  Ryett Hamman K Sherrye Puga-Rasch, MD 11/12/11 [phone]

## 2011-11-12 NOTE — ED Notes (Signed)
The pt has not had a bm  For 3 days

## 2011-11-12 NOTE — ED Notes (Signed)
Pt to CT

## 2011-11-12 NOTE — ED Notes (Signed)
The pt has had abd pain headache and hurting all over her body for 3 days. bp high

## 2011-11-15 ENCOUNTER — Encounter: Payer: Self-pay | Admitting: Family Medicine

## 2011-11-15 ENCOUNTER — Ambulatory Visit (INDEPENDENT_AMBULATORY_CARE_PROVIDER_SITE_OTHER): Payer: Medicare Other | Admitting: Family Medicine

## 2011-11-15 VITALS — BP 185/93 | HR 94 | Ht 61.0 in | Wt 148.0 lb

## 2011-11-15 DIAGNOSIS — M545 Low back pain, unspecified: Secondary | ICD-10-CM | POA: Insufficient documentation

## 2011-11-15 DIAGNOSIS — I10 Essential (primary) hypertension: Secondary | ICD-10-CM

## 2011-11-15 DIAGNOSIS — E119 Type 2 diabetes mellitus without complications: Secondary | ICD-10-CM

## 2011-11-15 DIAGNOSIS — K5900 Constipation, unspecified: Secondary | ICD-10-CM | POA: Insufficient documentation

## 2011-11-15 LAB — CBC
HCT: 44.1 % (ref 36.0–46.0)
Hemoglobin: 14.5 g/dL (ref 12.0–15.0)
MCH: 29.5 pg (ref 26.0–34.0)
MCHC: 32.9 g/dL (ref 30.0–36.0)
MCV: 89.6 fL (ref 78.0–100.0)
Platelets: 224 10*3/uL (ref 150–400)
RBC: 4.92 MIL/uL (ref 3.87–5.11)
RDW: 14.9 % (ref 11.5–15.5)
WBC: 14 10*3/uL — ABNORMAL HIGH (ref 4.0–10.5)

## 2011-11-15 LAB — COMPLETE METABOLIC PANEL WITH GFR
ALT: 31 U/L (ref 0–35)
AST: 17 U/L (ref 0–37)
Albumin: 4.3 g/dL (ref 3.5–5.2)
Alkaline Phosphatase: 128 U/L — ABNORMAL HIGH (ref 39–117)
BUN: 10 mg/dL (ref 6–23)
CO2: 30 mEq/L (ref 19–32)
Calcium: 9.5 mg/dL (ref 8.4–10.5)
Chloride: 99 mEq/L (ref 96–112)
Creat: 1.02 mg/dL (ref 0.50–1.10)
GFR, Est African American: 67 mL/min
GFR, Est Non African American: 58 mL/min — ABNORMAL LOW
Glucose, Bld: 133 mg/dL — ABNORMAL HIGH (ref 70–99)
Potassium: 3.9 mEq/L (ref 3.5–5.3)
Sodium: 140 mEq/L (ref 135–145)
Total Bilirubin: 0.5 mg/dL (ref 0.3–1.2)
Total Protein: 6.8 g/dL (ref 6.0–8.3)

## 2011-11-15 LAB — POCT GLYCOSYLATED HEMOGLOBIN (HGB A1C): Hemoglobin A1C: 7

## 2011-11-15 MED ORDER — TRAMADOL HCL 50 MG PO TABS: 50.0000 mg | ORAL_TABLET | Freq: Four times a day (QID) | ORAL | Status: DC | PRN

## 2011-11-15 MED ORDER — MAGNESIUM CITRATE PO SOLN: 148.0000 mL | Freq: Every day | ORAL | Status: AC | PRN

## 2011-11-15 MED ORDER — BISACODYL 10 MG RE SUPP: 10.0000 mg | RECTAL | Status: AC | PRN

## 2011-11-15 NOTE — Progress Notes (Signed)
[name] [name] reserves to clinic today for: #1 constipation/abdominal pain: Present for several days. Last bowel movement 5 days ago and, she is producing gas per rectum. Has not had any over-the-counter treatments. Was seen in the emergency room 2 days ago and was discharged home well. She notes pain in her epigastric bilateral abdomen worse with eating. The pain is nonexertional and not substernal. She denies any vomiting or blood per rectum.  #2 low back pain present for one week. Located in right SI joint. Pain with motion. No radicular pain weakness or numbness. No bowel or bladder dysfunction or numbness. She notes pain in her low back with activities of daily living. She is taking Tylenol and Ultram.  #3 hypertension: Blood pressure elevated today is taking all medications listed below denies any chest pain syncope palpitations edema. Thinks her pain may be causing her blood pressure to be high.  #4 diabetes is due for a hemoglobin A1c today. Taking medications as listed below. No polyuria or polydipsia or symptomatic hyperglycemia or hypoglycemia. A1c result today is 7.  PMH reviewed.  ROS as above otherwise neg Medications reviewed. Current Outpatient Prescriptions  Medication Sig Dispense Refill  . albuterol (PROAIR HFA) 108 (90 BASE) MCG/ACT inhaler Inhale 2 puffs into the lungs at bedtime. and as needed for wheezing or shortness of breath  1 Inhaler  2  . amLODipine (NORVASC) 10 MG tablet Take 1 tablet (10 mg total) by mouth daily.  90 tablet  4  . beclomethasone (QVAR) 40 MCG/ACT inhaler Inhale 2 puffs into the lungs at bedtime.  1 Inhaler  11  . cilostazol (PLETAL) 50 MG tablet Take 50 mg by mouth 2 (two) times daily. Dose per vascular surgeon      . clopidogrel (PLAVIX) 75 MG tablet Take 1 tablet (75 mg total) by mouth daily.  90 tablet  4  . enalapril (VASOTEC) 20 MG tablet Take 1 tablet (20 mg total) by mouth daily.  90 tablet  4  . fluticasone (FLONASE) 50 MCG/ACT nasal spray  Place 1 spray into the nose daily as needed. Spray into both nostrils, for nasal congestion      . Fluticasone-Salmeterol (ADVAIR DISKUS) 500-50 MCG/DOSE AEPB Inhale 1 puff into the lungs 2 (two) times daily.        . hydrochlorothiazide 25 MG tablet Take 1 tablet (25 mg total) by mouth daily.  90 tablet  4  . metFORMIN (GLUCOPHAGE) 500 MG tablet Take 1 tablet (500 mg total) by mouth 2 (two) times daily.  180 tablet  4  . montelukast (SINGULAIR) 10 MG tablet Take 1 tablet (10 mg total) by mouth at bedtime.  90 tablet  4  . simvastatin (ZOCOR) 20 MG tablet Take 1 tablet (20 mg total) by mouth at bedtime.  90 tablet  4  . traMADol (ULTRAM) 50 MG tablet Take 1 tablet (50 mg total) by mouth every 6 (six) hours as needed for pain. Maximum dose= 8 tablets per day  30 tablet  2  . DISCONTD: traMADol (ULTRAM) 50 MG tablet Take 2 tabs by mouth twice a day if needed for pain      . DISCONTD: traMADol (ULTRAM) 50 MG tablet Take 1 tablet (50 mg total) by mouth every 6 (six) hours as needed for pain. Maximum dose= 8 tablets per day  15 tablet  0  . bisacodyl ([facility] BISACODYL) 10 MG suppository Place 1 suppository (10 mg total) rectally as needed for constipation.  12 suppository  0  .  carvedilol (COREG) 6.25 MG tablet Take 1 tablet (6.25 mg total) by mouth 2 (two) times daily.  180 tablet  4  . cyclobenzaprine (FLEXERIL) 10 MG tablet Take 10 mg by mouth 2 (two) times daily as needed. For spasms       . magnesium citrate solution Take 148 mLs by mouth daily as needed.  300 mL  12     Exam:  BP 185/93  Pulse 94  Ht 5\' 1"  (1.549 m)  Wt 148 lb (67.132 kg)  BMI 27.96 kg/m2 Gen: Well NAD HEENT: EOMI,  MMM Lungs: CTABL Nl WOB Heart: RRR no MRG Abd: NABS, NT, ND, no rebound Exts: Non edematous BL  LE, warm and well perfused.  MSK: Entire spine is nontender along midline. Tenderness to palpation in the right SI joint.  Range of motion of back is normal. Pretzel stretch produces pain in the right SI  joint. Straight leg and Faber negative. Gait is normal strength is preserved. Reflexes are equal bilaterally

## 2011-11-15 NOTE — Assessment & Plan Note (Signed)
Blood pressure elevated today. I think this is mostly due to her pain. Plan to have return to clinic in one week with recheck blood pressure.

## 2011-11-15 NOTE — Assessment & Plan Note (Signed)
And right SI joint. No red flags positive today. Plan to treat conservatively with Tylenol continued exercises and tramadol as needed. Will followup if red flags present or not improving.

## 2011-11-15 NOTE — Patient Instructions (Signed)
Thank you for coming in today. Take the mag citrate 1/2 bottle to 1 bottle for bowel movements. It tastes better cold.  Use the suppository today to get things going.  Come back if still feeling bad.  See me next week to make sure your blood pressure is OK.  If you have crushing pain in  The middle of your chest that worsens with walking or going up stairs we need to know about that.

## 2011-11-15 NOTE — Assessment & Plan Note (Signed)
Associated with abdominal pain. Had limited workup in the emergency room. Plan to obtain CBC and comprehensive metabolic panel today. We'll also treat constipation with magnesium citrate and bisacodyl suppository. If not improved will return to clinic. I don't think this is related to any sort of chest pain as is completely nonexertional and not in her chest.

## 2011-11-15 NOTE — Assessment & Plan Note (Signed)
Hemoglobin A1c is at goal. No plans to change regimen.

## 2011-11-22 ENCOUNTER — Encounter: Payer: Self-pay | Admitting: Family Medicine

## 2011-11-22 ENCOUNTER — Ambulatory Visit (INDEPENDENT_AMBULATORY_CARE_PROVIDER_SITE_OTHER): Payer: Medicare Other | Admitting: Family Medicine

## 2011-11-22 VITALS — BP 160/84 | HR 60 | Ht 61.0 in | Wt 143.6 lb

## 2011-11-22 DIAGNOSIS — M545 Low back pain, unspecified: Secondary | ICD-10-CM

## 2011-11-22 DIAGNOSIS — G44219 Episodic tension-type headache, not intractable: Secondary | ICD-10-CM

## 2011-11-22 NOTE — Patient Instructions (Signed)
Thank you for coming in today. We will get a scan of your brain this week.  See me in 1-2 weeks.  You back pain will get better.  If you have incontinence or leg weakness let me know.

## 2011-11-22 NOTE — Assessment & Plan Note (Signed)
Her headache may be consistent with her past diagnosis of tension type headaches. However it has been going for a month and a half now. She has no neurologic changes, but does have a significant surgical history for benign tumor removal. I feel at this point and head CT scan is warranted. Will followup in one or 2 weeks.

## 2011-11-22 NOTE — Progress Notes (Signed)
Ms. Pio presents to clinic today to followup her low back pain and to discuss headache.  1) low back pain: Present now for 3 weeks. Currently managing with Tylenol and tramadol. No radiculopathy weakness numbness bowel or bladder dysfunction. She is continuing with activities of daily living. Not much change since last visit.  2) headache: present now for one and a half months. Headache is located posterior and anterior. No photophobia or phonophobia. No clumsiness or weakness. No dizziness or vision change. Her headache is present most of the day and most of the days of the week. She notes her past surgical history significant for a removed benign brain tumor a few years ago.  PMH reviewed.  ROS as above otherwise neg Medications reviewed. Current Outpatient Prescriptions  Medication Sig Dispense Refill  . albuterol (PROAIR HFA) 108 (90 BASE) MCG/ACT inhaler Inhale 2 puffs into the lungs at bedtime. and as needed for wheezing or shortness of breath  1 Inhaler  2  . amLODipine (NORVASC) 10 MG tablet Take 1 tablet (10 mg total) by mouth daily.  90 tablet  4  . beclomethasone (QVAR) 40 MCG/ACT inhaler Inhale 2 puffs into the lungs at bedtime.  1 Inhaler  11  . bisacodyl ([facility] BISACODYL) 10 MG suppository Place 1 suppository (10 mg total) rectally as needed for constipation.  12 suppository  0  . carvedilol (COREG) 6.25 MG tablet Take 1 tablet (6.25 mg total) by mouth 2 (two) times daily.  180 tablet  4  . cilostazol (PLETAL) 50 MG tablet Take 50 mg by mouth 2 (two) times daily. Dose per vascular surgeon      . clopidogrel (PLAVIX) 75 MG tablet Take 1 tablet (75 mg total) by mouth daily.  90 tablet  4  . cyclobenzaprine (FLEXERIL) 10 MG tablet Take 10 mg by mouth 2 (two) times daily as needed. For spasms       . enalapril (VASOTEC) 20 MG tablet Take 1 tablet (20 mg total) by mouth daily.  90 tablet  4  . fluticasone (FLONASE) 50 MCG/ACT nasal spray Place 1 spray into the nose daily as needed.  Spray into both nostrils, for nasal congestion      . Fluticasone-Salmeterol (ADVAIR DISKUS) 500-50 MCG/DOSE AEPB Inhale 1 puff into the lungs 2 (two) times daily.        . hydrochlorothiazide 25 MG tablet Take 1 tablet (25 mg total) by mouth daily.  90 tablet  4  . metFORMIN (GLUCOPHAGE) 500 MG tablet Take 1 tablet (500 mg total) by mouth 2 (two) times daily.  180 tablet  4  . montelukast (SINGULAIR) 10 MG tablet Take 1 tablet (10 mg total) by mouth at bedtime.  90 tablet  4  . simvastatin (ZOCOR) 20 MG tablet Take 1 tablet (20 mg total) by mouth at bedtime.  90 tablet  4  . traMADol (ULTRAM) 50 MG tablet Take 1 tablet (50 mg total) by mouth every 6 (six) hours as needed for pain. Maximum dose= 8 tablets per day  30 tablet  2    Exam:  BP 160/84  Pulse 60  Ht 5\' 1"  (1.549 m)  Wt 143 lb 9.6 oz (65.137 kg)  BMI 27.13 kg/m2 Gen: Well NAD HEENT: EOMI,  MMM Lungs: CTABL Nl WOB Heart: RRR no MRG Abd: NABS, NT, ND Exts: Non edematous BL  LE, warm and well perfused.  MSK: non tender spinal midline. Bilateral lumbar paraspinal tenderness. No weakness in extremities.  Neuro: AOx3 No weakness numbness.  Normal Gait and reflexes and strength.

## 2011-11-22 NOTE — Assessment & Plan Note (Signed)
Her low back pain is consistent with musculoskeletal strain. We'll continue conservative management and have discussed red flag signs or symptoms.  We will followup in one or 2 weeks if not improved

## 2011-11-24 ENCOUNTER — Telehealth: Payer: Self-pay | Admitting: *Deleted

## 2011-11-24 NOTE — Telephone Encounter (Signed)
Attempted to call patient on home, cell and work numbers. Home and cell numbers are incorrect and work number is disconnected. CT scan canceled for tomorrow. If patient calls about appointment information please ask for her current phone number and I will reschedule the appointment.[name] [name], [name] [name]

## 2011-11-25 ENCOUNTER — Other Ambulatory Visit: Payer: Medicare Other

## 2011-12-06 ENCOUNTER — Encounter: Payer: Self-pay | Admitting: Family Medicine

## 2011-12-06 ENCOUNTER — Ambulatory Visit (INDEPENDENT_AMBULATORY_CARE_PROVIDER_SITE_OTHER): Payer: Medicare Other | Admitting: Family Medicine

## 2011-12-06 DIAGNOSIS — L0390 Cellulitis, unspecified: Secondary | ICD-10-CM

## 2011-12-06 DIAGNOSIS — G44219 Episodic tension-type headache, not intractable: Secondary | ICD-10-CM

## 2011-12-06 DIAGNOSIS — L0291 Cutaneous abscess, unspecified: Secondary | ICD-10-CM

## 2011-12-06 DIAGNOSIS — M545 Low back pain, unspecified: Secondary | ICD-10-CM

## 2011-12-06 DIAGNOSIS — J441 Chronic obstructive pulmonary disease with (acute) exacerbation: Secondary | ICD-10-CM | POA: Insufficient documentation

## 2011-12-06 MED ORDER — MUPIROCIN 2 % EX OINT: TOPICAL_OINTMENT | Freq: Three times a day (TID) | CUTANEOUS | Status: AC

## 2011-12-06 MED ORDER — PREDNISONE 50 MG PO TABS: 50.0000 mg | ORAL_TABLET | Freq: Every day | ORAL | Status: DC

## 2011-12-06 MED ORDER — GUAIFENESIN-CODEINE 100-10 MG/5ML PO SYRP: 5.0000 mL | ORAL_SOLUTION | Freq: Two times a day (BID) | ORAL | Status: DC | PRN

## 2011-12-06 MED ORDER — PREDNISONE 20 MG PO TABS
60.0000 mg | ORAL_TABLET | Freq: Once | ORAL | Status: AC
  Administered 2011-12-06: 60 mg via ORAL

## 2011-12-06 NOTE — Patient Instructions (Signed)
Thank you for coming in today. Please get and start taking the prednisone today.  Take it daily for 5 days.  Use your albuterol every 6 hours today and tomorrow.  Use the bactroban ointment 2-3 times a day.  Get the cough medicine (it isnt covered by medicaid) if you can afford it.  If you have trouble breathing come back.  Take care and come and get your flu shot when you feel better.

## 2011-12-06 NOTE — Assessment & Plan Note (Signed)
Much improved today with rest and continued activity. No red flag signs or symptoms.

## 2011-12-06 NOTE — Assessment & Plan Note (Signed)
Most significantly [name] [name] has a COPD exacerbation today. She has mild increased work of breathing with changes in her expiratory phase.  She however does not have dyspnea or decrease in oxygen saturation. Plan to treat with prednisone and albuterol inhaler. Will dispense 60 mg of prednisone here in the clinic and write  a prescription for 5 days of 50 mg.  Also encourage the use of albuterol inhaler scheduled over the next 2 days. Will followup in clinic if no improvement or worsening.

## 2011-12-06 NOTE — Assessment & Plan Note (Signed)
Headache is also much improved. His sugars never did get a CT scan of her head however her symptoms are resolved. Therefore we'll cancel CT scan and followup as needed.

## 2011-12-06 NOTE — Progress Notes (Signed)
[name] [name] presents to clinic today to discuss a new cough, followup on her headache and back pain, and to discuss a new rash in her right groin.  Cough: Starting one week ago is productive of clear sputum not associated with fever or dyspnea but is associated with chills wheezing increased use of albuterol and some diarrhea. She did not get a flu shot yet this year. She has a pertinent past medical history significant for COPD with the use of multiple inhalers.  Headache: Much improved since the last visit. She never did get a CT scan of her head but her headache is now resolved. She does note some headache with coughing but aside from that is well.  Low back pain: Also much improved since the last visit. No weakness numbness bowel or bladder dysfunction. Feels well otherwise.  Rash: Developing in her right inguinal area. It's mildly tender but not itchy. No abdominal pain, or fevers.   PMH reviewed.  ROS as above otherwise neg Medications reviewed. Current Outpatient Prescriptions  Medication Sig Dispense Refill  . albuterol (PROAIR HFA) 108 (90 BASE) MCG/ACT inhaler Inhale 2 puffs into the lungs at bedtime. and as needed for wheezing or shortness of breath  1 Inhaler  2  . amLODipine (NORVASC) 10 MG tablet Take 1 tablet (10 mg total) by mouth daily.  90 tablet  4  . beclomethasone (QVAR) 40 MCG/ACT inhaler Inhale 2 puffs into the lungs at bedtime.  1 Inhaler  11  . carvedilol (COREG) 6.25 MG tablet Take 1 tablet (6.25 mg total) by mouth 2 (two) times daily.  180 tablet  4  . cilostazol (PLETAL) 50 MG tablet Take 50 mg by mouth 2 (two) times daily. Dose per vascular surgeon      . clopidogrel (PLAVIX) 75 MG tablet Take 1 tablet (75 mg total) by mouth daily.  90 tablet  4  . cyclobenzaprine (FLEXERIL) 10 MG tablet Take 10 mg by mouth 2 (two) times daily as needed. For spasms       . enalapril (VASOTEC) 20 MG tablet Take 1 tablet (20 mg total) by mouth daily.  90 tablet  4  . fluticasone  (FLONASE) 50 MCG/ACT nasal spray Place 1 spray into the nose daily as needed. Spray into both nostrils, for nasal congestion      . Fluticasone-Salmeterol (ADVAIR DISKUS) 500-50 MCG/DOSE AEPB Inhale 1 puff into the lungs 2 (two) times daily.        Marland Kitchen guaiFENesin-codeine (ROBITUSSIN AC) 100-10 MG/5ML syrup Take 5 mLs by mouth 2 (two) times daily as needed for cough.  240 mL  0  . hydrochlorothiazide 25 MG tablet Take 1 tablet (25 mg total) by mouth daily.  90 tablet  4  . metFORMIN (GLUCOPHAGE) 500 MG tablet Take 1 tablet (500 mg total) by mouth 2 (two) times daily.  180 tablet  4  . montelukast (SINGULAIR) 10 MG tablet Take 1 tablet (10 mg total) by mouth at bedtime.  90 tablet  4  . mupirocin ointment (BACTROBAN) 2 % Apply topically 3 (three) times daily.  22 g  0  . predniSONE (DELTASONE) 50 MG tablet Take 1 tablet (50 mg total) by mouth daily.  5 tablet  0  . simvastatin (ZOCOR) 20 MG tablet Take 1 tablet (20 mg total) by mouth at bedtime.  90 tablet  4  . traMADol (ULTRAM) 50 MG tablet Take 1 tablet (50 mg total) by mouth every 6 (six) hours as needed for pain. Maximum dose=  8 tablets per day  30 tablet  2   Current Facility-Administered Medications  Medication Dose Route Frequency Provider Last Rate Last Dose  . predniSONE (DELTASONE) tablet 60 mg  60 mg Oral Once [employer]   60 mg at 12/06/11 4098    Exam:  BP 126/83  Pulse 70  Temp(Src) 98.4 F (36.9 C) (Oral)  Ht 5\' 1"  (1.549 m)  Wt 144 lb (65.318 kg)  BMI 27.21 kg/m2  SpO2 97% Gen: Well NAD HEENT: EOMI,  MMM Lungs: Mildly increased work of breathing. Prolonged expiratory phase with rhonchi bilaterally.   Some expiratory wheezes present bilaterally. No crackles or dead space is noted. Heart: RRR no MRG Abd: NABS, NT, ND Exts: Non edematous BL  LE, warm and well perfused.  Skin: Erythematous area along the previous C-section scar on the right lower belly. No discharge present, mildly tender to palpation. No fluctuance or  induration noted.

## 2011-12-06 NOTE — Assessment & Plan Note (Signed)
Mild cellulitis present in a old C-section scar.  Will treat with topical Bactroban. If worsening will return to clinic in start oral antibiotics.

## 2012-01-12 ENCOUNTER — Encounter: Payer: Self-pay | Admitting: Family Medicine

## 2012-01-12 ENCOUNTER — Ambulatory Visit (INDEPENDENT_AMBULATORY_CARE_PROVIDER_SITE_OTHER): Payer: Medicare Other | Admitting: Family Medicine

## 2012-01-12 VITALS — BP 166/83 | HR 63 | Temp 98.1°F | Ht 61.0 in | Wt 142.5 lb

## 2012-01-12 DIAGNOSIS — J441 Chronic obstructive pulmonary disease with (acute) exacerbation: Secondary | ICD-10-CM

## 2012-01-12 MED ORDER — AZITHROMYCIN 250 MG PO TABS: ORAL_TABLET | ORAL | Status: AC

## 2012-01-12 MED ORDER — ALBUTEROL SULFATE (2.5 MG/3ML) 0.083% IN NEBU: 2.5000 mg | INHALATION_SOLUTION | Freq: Four times a day (QID) | RESPIRATORY_TRACT | Status: DC | PRN

## 2012-01-12 MED ORDER — PREDNISONE 50 MG PO TABS: ORAL_TABLET | ORAL | Status: DC

## 2012-01-12 NOTE — Patient Instructions (Signed)
I have sent in for nebulizer solution for you. I have also sent in antibiotics and more prednisone.   Keep using your long-term inhalers as prescribed.  If you're having any problems or not getting better, be sure to come back and see [place].

## 2012-01-12 NOTE — Progress Notes (Signed)
  Subjective:    Patient ID: [name] [name], female    DOB: 04/11/1946, 66 y.o.   MRN: [id number]  HPI 1.  Cough:  Seen in clinic about one month prior diagnosis COPD exacerbation. Treated with bronchodilators, steroids, cough suppressant. Patient states she has not had any relief since last being seen. She has continued to cough every night and this keeps her awake. Cough also to continue throughout the day. She states she has little dyspnea on exertion but does admit she does not walk far secondary to leg pain. She has been continuing to take her bronchodilators as prescribed. However it sounds like she's taking Qvar, Advair, albuterol and is unsure exactly when to take each. Does also have her daughter's nebulizer machine but no nebulizer solution. No fevers or chills. No nasal congestion noted   Review of Systems See HPI above for review of systems.       Objective:   Physical Exam Gen:  Alert, cooperative patient who appears stated age in no acute distress.  Vital signs reviewed. HEENT:  /AT.  EOMI, PERRL.  MMM, tonsils non-erythematous, non-edematous.  External ears WNL, Bilateral TM's normal without retraction, redness or bulging.  Neck:  No thryomegaly or cervical lymphadenopathy Cardiac:  Regular rate and rhythm without murmur auscultated.  Good S1/S2. Lungs:  Diffuse wheezing in all lung fields. Worse at the bases. Prolonged expiratory phase noted Abd:  Soft/nondistended/nontender.  Good bowel sounds throughout all four quadrants.  No masses noted.  Ext:  No edema noted       Assessment & Plan:

## 2012-01-12 NOTE — Assessment & Plan Note (Signed)
No improvement today based on patient's symptoms.  Currently with tight sounding chest. Provided Albuterol and Atrovent nebulizer treatment in clinic.   No dyspnea currently.   Will retreat with another steroid burst plus antibiotics due to persistent symptoms.   FU in 2 weeks if no improvement.

## 2012-02-04 ENCOUNTER — Telehealth: Payer: Self-pay | Admitting: *Deleted

## 2012-02-04 NOTE — Telephone Encounter (Signed)
Patient calls stating she is having chest tightness and SOB with exertion. Also continues having cough. States she has been having this for a while.  Where she lives now she has to walk up steps and she notices it more. Advised patient that she needs to go to ED to be checked out. States  she has no way to go there today. Wants appointment with Dr. Denyse Amass next week. Appointment scheduled and advised  that she can also call EMS . Patient voices understanding.

## 2012-02-08 ENCOUNTER — Encounter: Payer: Self-pay | Admitting: Family Medicine

## 2012-02-08 ENCOUNTER — Ambulatory Visit (INDEPENDENT_AMBULATORY_CARE_PROVIDER_SITE_OTHER): Payer: Medicare Other | Admitting: Family Medicine

## 2012-02-08 ENCOUNTER — Ambulatory Visit (HOSPITAL_COMMUNITY)
Admission: RE | Admit: 2012-02-08 | Discharge: 2012-02-08 | Disposition: A | Discharge status: Home / Self Care | Payer: Medicare Other | Source: Ambulatory Visit | Attending: Family Medicine | Admitting: Family Medicine

## 2012-02-08 DIAGNOSIS — E119 Type 2 diabetes mellitus without complications: Secondary | ICD-10-CM

## 2012-02-08 DIAGNOSIS — J441 Chronic obstructive pulmonary disease with (acute) exacerbation: Secondary | ICD-10-CM

## 2012-02-08 DIAGNOSIS — I10 Essential (primary) hypertension: Secondary | ICD-10-CM | POA: Insufficient documentation

## 2012-02-08 DIAGNOSIS — R0602 Shortness of breath: Secondary | ICD-10-CM | POA: Insufficient documentation

## 2012-02-08 LAB — BASIC METABOLIC PANEL WITH GFR
BUN: 16 mg/dL (ref 6–23)
CO2: 31 mEq/L (ref 19–32)
Calcium: 10.1 mg/dL (ref 8.4–10.5)
Chloride: 101 mEq/L (ref 96–112)
Creat: 0.98 mg/dL (ref 0.50–1.10)
GFR, Est African American: 71 mL/min
GFR, Est Non African American: 61 mL/min
Glucose, Bld: 150 mg/dL — ABNORMAL HIGH (ref 70–99)
Potassium: 4 mEq/L (ref 3.5–5.3)
Sodium: 141 mEq/L (ref 135–145)

## 2012-02-08 LAB — POCT GLYCOSYLATED HEMOGLOBIN (HGB A1C): Hemoglobin A1C: 7.4

## 2012-02-08 MED ORDER — PREDNISONE 20 MG PO TABS: ORAL_TABLET | ORAL | Status: DC

## 2012-02-08 MED ORDER — ALBUTEROL SULFATE (2.5 MG/3ML) 0.083% IN NEBU
2.5000 mg | INHALATION_SOLUTION | Freq: Once | RESPIRATORY_TRACT | Status: AC
  Administered 2012-02-08: 2.5 mg via RESPIRATORY_TRACT

## 2012-02-08 MED ORDER — IPRATROPIUM BROMIDE 0.02 % IN SOLN
0.5000 mg | Freq: Once | RESPIRATORY_TRACT | Status: AC
  Administered 2012-02-08: 0.5 mg via RESPIRATORY_TRACT

## 2012-02-08 MED ORDER — AMOXICILLIN 500 MG PO CAPS: 500.0000 mg | ORAL_CAPSULE | Freq: Three times a day (TID) | ORAL | Status: AC

## 2012-02-08 MED ORDER — IPRATROPIUM BROMIDE 0.02 % IN SOLN: 500.0000 ug | Freq: Four times a day (QID) | RESPIRATORY_TRACT | Status: DC | PRN

## 2012-02-08 NOTE — Patient Instructions (Signed)
Thank you for coming in today. As we talked about we will try to wait this out at home.  We will do a xray, antibiotics, and his and more breathing treatments.   When you go home, use an albuterol treatment every 6 hours, and an ipratropium treatment every 6 hours. Use albuterol more frequently if needed. If you are worsening just can't catch your breath go to the emergency room. Please come back tomorrow for a checkup. I will call you with some other test results if they are abnormal and we need to change. Scheduled an appointment with the cross cover Dr. (me) tomorrow morning.

## 2012-02-08 NOTE — Progress Notes (Signed)
[name] [name] is a 66 y.o. female who presents to [facility] today for   Shortness of breath. Her sugars has had frequent COPD exacerbations in the last 2-3 months. She notes multiple episodes of becoming short of breath with a productive cough. She's been treated with albuterol nebulizers which has done well. However 4 days ago she noted again productive cough and shortness of breath. She additionally describes wheezing and some chest tightness. She denies any true central chest pain that worsens with exertion. She denies any fevers or chills.   PMH reviewed. Significant for COPD, chronic lymphoblastic leukemia, peripheral arterial disease.  She had a normal negative nuclear stress test December of 2011. Social history: Current every day smoker ROS as above otherwise neg Medications reviewed. Current Outpatient Prescriptions  Medication Sig Dispense Refill  . albuterol (PROAIR HFA) 108 (90 BASE) MCG/ACT inhaler Inhale 2 puffs into the lungs at bedtime. and as needed for wheezing or shortness of breath  1 Inhaler  2  . albuterol (PROVENTIL) (2.5 MG/3ML) 0.083% nebulizer solution Take 3 mLs (2.5 mg total) by nebulization every 6 (six) hours as needed for wheezing.  75 mL  12  . amLODipine (NORVASC) 10 MG tablet Take 1 tablet (10 mg total) by mouth daily.  90 tablet  4  . beclomethasone (QVAR) 40 MCG/ACT inhaler Inhale 2 puffs into the lungs at bedtime.  1 Inhaler  11  . carvedilol (COREG) 6.25 MG tablet Take 1 tablet (6.25 mg total) by mouth 2 (two) times daily.  180 tablet  4  . cilostazol (PLETAL) 50 MG tablet Take 50 mg by mouth 2 (two) times daily. Dose per vascular surgeon      . clopidogrel (PLAVIX) 75 MG tablet Take 1 tablet (75 mg total) by mouth daily.  90 tablet  4  . cyclobenzaprine (FLEXERIL) 10 MG tablet Take 10 mg by mouth 2 (two) times daily as needed. For spasms       . enalapril (VASOTEC) 20 MG tablet Take 1 tablet (20 mg total) by mouth daily.  90 tablet  4  . fluticasone (FLONASE) 50  MCG/ACT nasal spray Place 1 spray into the nose daily as needed. Spray into both nostrils, for nasal congestion      . Fluticasone-Salmeterol (ADVAIR DISKUS) 500-50 MCG/DOSE AEPB Inhale 1 puff into the lungs 2 (two) times daily.        . hydrochlorothiazide 25 MG tablet Take 1 tablet (25 mg total) by mouth daily.  90 tablet  4  . metFORMIN (GLUCOPHAGE) 500 MG tablet Take 1 tablet (500 mg total) by mouth 2 (two) times daily.  180 tablet  4  . montelukast (SINGULAIR) 10 MG tablet Take 1 tablet (10 mg total) by mouth at bedtime.  90 tablet  4  . simvastatin (ZOCOR) 20 MG tablet Take 1 tablet (20 mg total) by mouth at bedtime.  90 tablet  4  . traMADol (ULTRAM) 50 MG tablet Take 50 mg by mouth every 6 (six) hours as needed. Maximum dose= 8 tablets per day       . DISCONTD: traMADol (ULTRAM) 50 MG tablet Take 1 tablet (50 mg total) by mouth every 6 (six) hours as needed for pain. Maximum dose= 8 tablets per day  30 tablet  2    Exam:  BP 130/84  Pulse 76  Ht 5\' 1"  (1.549 m)  Wt 142 lb (64.411 kg)  BMI 26.83 kg/m2  SpO2 92% Gen: Well NAD HEENT: EOMI,  MMM Lungs: Normal work  of breathing. Prolonged expiratory phase with inspiratory and expiratory wheezing bilaterally Heart: RRR no MRG Abd: NABS, NT, ND Exts: Non edematous BL  LE.  Following albuterol/ipratropium treatment mild improvement in aeration and wheezing.

## 2012-02-08 NOTE — Assessment & Plan Note (Signed)
I am concerned for COPD exacerbation versus pneumonia versus CHF exacerbation. However CHF, and coronary artery disease very unlikely given relatively recent nuclear stress test and no lower from the edema or exertional angina. Plan to treat with prednisone, albuterol, ipratropium, amoxicillin.  Will assess with chest x-ray CBC basic metabolic panel. Closely reviewed warning signs such as worsening shortness of breath chest pain fever chills the patient who expresses understanding. Plan for close followup tomorrow. If no improvement will likely admit patient to the hospital.

## 2012-02-09 ENCOUNTER — Telehealth: Payer: Self-pay | Admitting: *Deleted

## 2012-02-09 ENCOUNTER — Ambulatory Visit (INDEPENDENT_AMBULATORY_CARE_PROVIDER_SITE_OTHER): Payer: Medicare Other | Admitting: Family Medicine

## 2012-02-09 VITALS — BP 160/90 | HR 100 | Temp 98.2°F | Ht 62.0 in | Wt 142.9 lb

## 2012-02-09 DIAGNOSIS — J441 Chronic obstructive pulmonary disease with (acute) exacerbation: Secondary | ICD-10-CM

## 2012-02-09 LAB — CBC WITH DIFFERENTIAL/PLATELET
Basophils Absolute: 0 10*3/uL (ref 0.0–0.1)
Basophils Relative: 0 % (ref 0–1)
Eosinophils Absolute: 0.2 10*3/uL (ref 0.0–0.7)
Eosinophils Relative: 2 % (ref 0–5)
HCT: 45.4 % (ref 36.0–46.0)
Hemoglobin: 14.2 g/dL (ref 12.0–15.0)
Lymphocytes Relative: 66 % — ABNORMAL HIGH (ref 12–46)
Lymphs Abs: 8.8 10*3/uL — ABNORMAL HIGH (ref 0.7–4.0)
MCH: 29.1 pg (ref 26.0–34.0)
MCHC: 31.3 g/dL (ref 30.0–36.0)
MCV: 93 fL (ref 78.0–100.0)
Monocytes Absolute: 0.9 10*3/uL (ref 0.1–1.0)
Monocytes Relative: 7 % (ref 3–12)
Neutro Abs: 3.5 10*3/uL (ref 1.7–7.7)
Neutrophils Relative %: 26 % — ABNORMAL LOW (ref 43–77)
Platelets: 269 10*3/uL (ref 150–400)
RBC: 4.88 MIL/uL (ref 3.87–5.11)
RDW: 16.1 % — ABNORMAL HIGH (ref 11.5–15.5)
WBC: 13.4 10*3/uL — ABNORMAL HIGH (ref 4.0–10.5)

## 2012-02-09 LAB — PATHOLOGIST SMEAR REVIEW

## 2012-02-09 MED ORDER — AZITHROMYCIN 500 MG PO TABS: 500.0000 mg | ORAL_TABLET | Freq: Every day | ORAL | Status: AC

## 2012-02-09 NOTE — Patient Instructions (Signed)
Thank you for coming in today. Please start taking azithromycin daily for 5 days.  Continue your other medicines. Come back on Friday for a asthma recheck. If you cannot catch your breath or get worse go to the hospital. Please keep people from smoking around you while you are sick. See me before your prednisone runs out.

## 2012-02-09 NOTE — Progress Notes (Signed)
[name] [name] is a 66 y.o. female who presents to [facility] today for COPD exacerbation. Please see previous to his note for full details. Yesterday was started on prednisone amoxicillin albuterol and ipratropium. In the interim feels about the same to slightly better. No fevers or chills or worsening dyspnea. Denies any chest pains or palpitations. Has some chest tightness consistent with prior COPD exacerbations.   PMH reviewed. Significant for COPD ROS as above otherwise neg Medications reviewed. Current Outpatient Prescriptions  Medication Sig Dispense Refill  . albuterol (PROAIR HFA) 108 (90 BASE) MCG/ACT inhaler Inhale 2 puffs into the lungs at bedtime. and as needed for wheezing or shortness of breath  1 Inhaler  2  . albuterol (PROVENTIL) (2.5 MG/3ML) 0.083% nebulizer solution Take 3 mLs (2.5 mg total) by nebulization every 6 (six) hours as needed for wheezing.  75 mL  12  . amLODipine (NORVASC) 10 MG tablet Take 1 tablet (10 mg total) by mouth daily.  90 tablet  4  . amoxicillin (AMOXIL) 500 MG capsule Take 1 capsule (500 mg total) by mouth 3 (three) times daily.  21 capsule  0  . azithromycin (ZITHROMAX) 500 MG tablet Take 1 tablet (500 mg total) by mouth daily.  5 tablet  0  . beclomethasone (QVAR) 40 MCG/ACT inhaler Inhale 2 puffs into the lungs at bedtime.  1 Inhaler  11  . carvedilol (COREG) 6.25 MG tablet Take 1 tablet (6.25 mg total) by mouth 2 (two) times daily.  180 tablet  4  . cilostazol (PLETAL) 50 MG tablet Take 50 mg by mouth 2 (two) times daily. Dose per vascular surgeon      . clopidogrel (PLAVIX) 75 MG tablet Take 1 tablet (75 mg total) by mouth daily.  90 tablet  4  . cyclobenzaprine (FLEXERIL) 10 MG tablet Take 10 mg by mouth 2 (two) times daily as needed. For spasms       . enalapril (VASOTEC) 20 MG tablet Take 1 tablet (20 mg total) by mouth daily.  90 tablet  4  . fluticasone (FLONASE) 50 MCG/ACT nasal spray Place 1 spray into the nose daily as needed. Spray into both  nostrils, for nasal congestion      . Fluticasone-Salmeterol (ADVAIR DISKUS) 500-50 MCG/DOSE AEPB Inhale 1 puff into the lungs 2 (two) times daily.        . hydrochlorothiazide 25 MG tablet Take 1 tablet (25 mg total) by mouth daily.  90 tablet  4  . ipratropium (ATROVENT) 0.02 % nebulizer solution Take 2.5 mLs (500 mcg total) by nebulization every 6 (six) hours as needed for wheezing.  75 mL  12  . metFORMIN (GLUCOPHAGE) 500 MG tablet Take 1 tablet (500 mg total) by mouth 2 (two) times daily.  180 tablet  4  . montelukast (SINGULAIR) 10 MG tablet Take 1 tablet (10 mg total) by mouth at bedtime.  90 tablet  4  . predniSONE (DELTASONE) 20 MG tablet 2 pills daily PO for 5 days then 1 pill daily po for 5 days.  15 tablet  0  . simvastatin (ZOCOR) 20 MG tablet Take 1 tablet (20 mg total) by mouth at bedtime.  90 tablet  4  . traMADol (ULTRAM) 50 MG tablet Take 50 mg by mouth every 6 (six) hours as needed. Maximum dose= 8 tablets per day       . DISCONTD: traMADol (ULTRAM) 50 MG tablet Take 1 tablet (50 mg total) by mouth every 6 (six) hours as needed for  pain. Maximum dose= 8 tablets per day  30 tablet  2   Current Facility-Administered Medications  Medication Dose Route Frequency Provider Last Rate Last Dose  . ipratropium (ATROVENT) nebulizer solution 0.5 mg  0.5 mg Nebulization Once Clementeen Graham, MD   0.5 mg at 02/08/12 0947    Exam:  BP 160/90  Pulse 100  Temp(Src) 98.2 F (36.8 C) (Oral)  Ht 5\' 2"  (1.575 m)  Wt 142 lb 14.4 oz (64.819 kg)  BMI 26.14 kg/m2  SpO2 97% Gen: Well NAD HEENT: EOMI,  MMM Lungs: Normal work of breathing prolonged respiratory phase with expiratory wheeze Heart: RRR no MRG Abd: NABS, NT, ND Exts: Non edematous BL  LE, warm and well perfused.   Lab Results  Component Value Date   WBC 13.4* 02/08/2012   HGB 14.2 02/08/2012   HCT 45.4 02/08/2012   MCV 93.0 02/08/2012   PLT 269 02/08/2012      Chemistry      Component Value Date/Time   NA 141 02/08/2012 0929    K 4.0 02/08/2012 0929   CL 101 02/08/2012 0929   CO2 31 02/08/2012 0929   BUN 16 02/08/2012 0929   CREATININE 0.98 02/08/2012 0929   CREATININE 1.00 11/12/2011 0134      Component Value Date/Time   CALCIUM 10.1 02/08/2012 0929   ALKPHOS 128* 11/15/2011 0928   AST 17 11/15/2011 0928   ALT 31 11/15/2011 0928   BILITOT 0.5 11/15/2011 0928     CXR results: IMPRESSION:  Question minimal hyperinflation configuration.

## 2012-02-09 NOTE — Telephone Encounter (Signed)
[facility] from [facility] requesting dx code for albuterol and nebulizer that was rx'd yesterday by Dr. Denyse Amass.  ICD-9 code given for COPD (496).  Gaylene Brooks, RN

## 2012-02-09 NOTE — Assessment & Plan Note (Signed)
COPD exacerbation slightly improved. However white count is a bit elevated.  No evidence of pneumonia on chest x-ray.  We are covering typical organisms with amoxicillin but not atypicals with azithromycin. Plan to add azithromycin to prednisone albuterol Atrovent and amoxicillin.  When to followup on Friday or sooner if worsening. Low threshold for admission. Discuss worsening breathing fevers chills patient expresses understanding.

## 2012-02-11 ENCOUNTER — Encounter: Payer: Self-pay | Admitting: Family Medicine

## 2012-02-11 ENCOUNTER — Ambulatory Visit (INDEPENDENT_AMBULATORY_CARE_PROVIDER_SITE_OTHER): Payer: Medicare Other | Admitting: Family Medicine

## 2012-02-11 VITALS — BP 158/88 | HR 82 | Ht 61.0 in | Wt 146.5 lb

## 2012-02-11 DIAGNOSIS — J441 Chronic obstructive pulmonary disease with (acute) exacerbation: Secondary | ICD-10-CM

## 2012-02-11 DIAGNOSIS — Z23 Encounter for immunization: Secondary | ICD-10-CM

## 2012-02-11 MED ORDER — ALBUTEROL SULFATE HFA 108 (90 BASE) MCG/ACT IN AERS: 2.0000 | INHALATION_SPRAY | Freq: Four times a day (QID) | RESPIRATORY_TRACT | Status: DC | PRN

## 2012-02-11 MED ORDER — PREDNISONE 10 MG PO TABS: 10.0000 mg | ORAL_TABLET | Freq: Every day | ORAL | Status: DC

## 2012-02-11 NOTE — Assessment & Plan Note (Signed)
Doing much better today to my assessment. Plan to continue medications as noted previously. Additionally we'll prescribe albuterol inhaler for use outside of the home and one month of 10 mg of prednisone. Plan to followup in 2 weeks. Please see precautions as noted in the discharge instructions

## 2012-02-11 NOTE — Progress Notes (Signed)
[name] [name] is a 66 y.o. female who presents to [facility] today for followup of COPD exacerbation. In the interim has continued the use of prednisone azithromycin amoxicillin ipratropium. She's feeling the same to a bit better. She denies any continued severe shortness of breath chest pain profound coughing fevers or chills.   PMH reviewed. Significant for COPD ROS as above otherwise neg Medications reviewed. Current Outpatient Prescriptions  Medication Sig Dispense Refill  . albuterol (PROVENTIL HFA;VENTOLIN HFA) 108 (90 BASE) MCG/ACT inhaler Inhale 2 puffs into the lungs every 6 (six) hours as needed for wheezing.  1 Inhaler  0  . albuterol (PROVENTIL) (2.5 MG/3ML) 0.083% nebulizer solution Take 3 mLs (2.5 mg total) by nebulization every 6 (six) hours as needed for wheezing.  75 mL  12  . amLODipine (NORVASC) 10 MG tablet Take 1 tablet (10 mg total) by mouth daily.  90 tablet  4  . amoxicillin (AMOXIL) 500 MG capsule Take 1 capsule (500 mg total) by mouth 3 (three) times daily.  21 capsule  0  . azithromycin (ZITHROMAX) 500 MG tablet Take 1 tablet (500 mg total) by mouth daily.  5 tablet  0  . beclomethasone (QVAR) 40 MCG/ACT inhaler Inhale 2 puffs into the lungs at bedtime.  1 Inhaler  11  . carvedilol (COREG) 6.25 MG tablet Take 1 tablet (6.25 mg total) by mouth 2 (two) times daily.  180 tablet  4  . cilostazol (PLETAL) 50 MG tablet Take 50 mg by mouth 2 (two) times daily. Dose per vascular surgeon      . clopidogrel (PLAVIX) 75 MG tablet Take 1 tablet (75 mg total) by mouth daily.  90 tablet  4  . cyclobenzaprine (FLEXERIL) 10 MG tablet Take 10 mg by mouth 2 (two) times daily as needed. For spasms       . enalapril (VASOTEC) 20 MG tablet Take 1 tablet (20 mg total) by mouth daily.  90 tablet  4  . fluticasone (FLONASE) 50 MCG/ACT nasal spray Place 1 spray into the nose daily as needed. Spray into both nostrils, for nasal congestion      . Fluticasone-Salmeterol (ADVAIR DISKUS) 500-50 MCG/DOSE  AEPB Inhale 1 puff into the lungs 2 (two) times daily.        . hydrochlorothiazide 25 MG tablet Take 1 tablet (25 mg total) by mouth daily.  90 tablet  4  . ipratropium (ATROVENT) 0.02 % nebulizer solution Take 2.5 mLs (500 mcg total) by nebulization every 6 (six) hours as needed for wheezing.  75 mL  12  . metFORMIN (GLUCOPHAGE) 500 MG tablet Take 1 tablet (500 mg total) by mouth 2 (two) times daily.  180 tablet  4  . montelukast (SINGULAIR) 10 MG tablet Take 1 tablet (10 mg total) by mouth at bedtime.  90 tablet  4  . predniSONE (DELTASONE) 10 MG tablet Take 1 tablet (10 mg total) by mouth daily.  30 tablet  0  . predniSONE (DELTASONE) 50 MG tablet Take 1 tab po daily x 5 days      . simvastatin (ZOCOR) 20 MG tablet Take 1 tablet (20 mg total) by mouth at bedtime.  90 tablet  4  . traMADol (ULTRAM) 50 MG tablet Take 50 mg by mouth every 6 (six) hours as needed. Maximum dose= 8 tablets per day       . DISCONTD: predniSONE (DELTASONE) 50 MG tablet Take 1 tab po daily x 5 days  5 tablet  0  . DISCONTD: albuterol (  PROAIR HFA) 108 (90 BASE) MCG/ACT inhaler Inhale 2 puffs into the lungs at bedtime. and as needed for wheezing or shortness of breath  1 Inhaler  2  . DISCONTD: predniSONE (DELTASONE) 20 MG tablet 2 pills daily PO for 5 days then 1 pill daily po for 5 days.  15 tablet  0  . DISCONTD: traMADol (ULTRAM) 50 MG tablet Take 1 tablet (50 mg total) by mouth every 6 (six) hours as needed for pain. Maximum dose= 8 tablets per day  30 tablet  2    Exam:  BP 158/88  Pulse 82  Ht 5\' 1"  (1.549 m)  Wt 146 lb 8 oz (66.452 kg)  BMI 27.68 kg/m2  SpO2 95% Gen: Well NAD able to communicate with full sentences HEENT: EOMI,  MMM Lungs: Normal work of breathing with minimal wheezing noted in the right lung Heart: RRR no MRG Abd: NABS, NT, ND Exts: Non edematous BL  LE, warm and well perfused.

## 2012-02-11 NOTE — Patient Instructions (Signed)
Thank you for coming in today. I wrote for albuterol inhaler.  [facility].  When you run out of the 20mg  prednisone please start on the 10mg  tablets of prednisone for 1 month.  See me before you move.  If you have trouble breathing or chest pain go to the ER.  Please call down to [facility] and arrange a doctors visit for a few weeks after you arrive.

## 2012-02-17 ENCOUNTER — Encounter: Payer: Self-pay | Admitting: Family Medicine

## 2012-02-17 ENCOUNTER — Ambulatory Visit (INDEPENDENT_AMBULATORY_CARE_PROVIDER_SITE_OTHER): Payer: Medicare Other | Admitting: Family Medicine

## 2012-02-17 VITALS — BP 168/93 | HR 80 | Temp 98.0°F | Ht 61.0 in | Wt 143.0 lb

## 2012-02-17 DIAGNOSIS — J449 Chronic obstructive pulmonary disease, unspecified: Secondary | ICD-10-CM

## 2012-02-17 DIAGNOSIS — I10 Essential (primary) hypertension: Secondary | ICD-10-CM

## 2012-02-17 MED ORDER — METOPROLOL TARTRATE 25 MG PO TABS: 25.0000 mg | ORAL_TABLET | Freq: Two times a day (BID) | ORAL | Status: DC

## 2012-02-17 NOTE — Assessment & Plan Note (Addendum)
Doing well currently. Plan to go to as needed albuterol and Atrovent. Followup in one month if still in [place]. Continue prednisone for one month

## 2012-02-17 NOTE — Assessment & Plan Note (Signed)
Blood pressure less than ideally controlled. Plan to switch from Coreg to metoprolol. Followup in one month

## 2012-02-17 NOTE — Progress Notes (Signed)
Patient ID: [name] [name], female   DOB: February 02, 1946, 66 y.o.   MRN: [id number] [name] [name] is a 66 y.o. female who presents to [facility] today for   1) COPD exacerbation: Doing much better now on amoxicillin albuterol Atrovent and prednisone.  Continues to have a cough, however is milligram trouble breathing.  She is still using butyryl and Atrovent every 6 hours, as that is what she thought she was supposed to do.    2) blood pressure: Elevated today. Has been elevated recently.  Does take Coreg in addition other blood pressure medications listed below.  PMH reviewed. Hypertension COPD, smoker ROS as above otherwise neg Medications reviewed. Current Outpatient Prescriptions  Medication Sig Dispense Refill  . albuterol (PROVENTIL HFA;VENTOLIN HFA) 108 (90 BASE) MCG/ACT inhaler Inhale 2 puffs into the lungs every 6 (six) hours as needed for wheezing.  1 Inhaler  0  . albuterol (PROVENTIL) (2.5 MG/3ML) 0.083% nebulizer solution Take 3 mLs (2.5 mg total) by nebulization every 6 (six) hours as needed for wheezing.  75 mL  12  . amLODipine (NORVASC) 10 MG tablet Take 1 tablet (10 mg total) by mouth daily.  90 tablet  4  . amoxicillin (AMOXIL) 500 MG capsule Take 1 capsule (500 mg total) by mouth 3 (three) times daily.  21 capsule  0  . beclomethasone (QVAR) 40 MCG/ACT inhaler Inhale 2 puffs into the lungs at bedtime.  1 Inhaler  11  . cilostazol (PLETAL) 50 MG tablet Take 50 mg by mouth 2 (two) times daily. Dose per vascular surgeon      . clopidogrel (PLAVIX) 75 MG tablet Take 1 tablet (75 mg total) by mouth daily.  90 tablet  4  . cyclobenzaprine (FLEXERIL) 10 MG tablet Take 10 mg by mouth 2 (two) times daily as needed. For spasms       . enalapril (VASOTEC) 20 MG tablet Take 1 tablet (20 mg total) by mouth daily.  90 tablet  4  . fluticasone (FLONASE) 50 MCG/ACT nasal spray Place 1 spray into the nose daily as needed. Spray into both nostrils, for nasal congestion      . Fluticasone-Salmeterol  (ADVAIR DISKUS) 500-50 MCG/DOSE AEPB Inhale 1 puff into the lungs 2 (two) times daily.        . hydrochlorothiazide 25 MG tablet Take 1 tablet (25 mg total) by mouth daily.  90 tablet  4  . ipratropium (ATROVENT) 0.02 % nebulizer solution Take 2.5 mLs (500 mcg total) by nebulization every 6 (six) hours as needed for wheezing.  75 mL  12  . metFORMIN (GLUCOPHAGE) 500 MG tablet Take 1 tablet (500 mg total) by mouth 2 (two) times daily.  180 tablet  4  . montelukast (SINGULAIR) 10 MG tablet Take 1 tablet (10 mg total) by mouth at bedtime.  90 tablet  4  . predniSONE (DELTASONE) 10 MG tablet Take 1 tablet (10 mg total) by mouth daily.  30 tablet  0  . predniSONE (DELTASONE) 50 MG tablet Take 1 tab po daily x 5 days      . simvastatin (ZOCOR) 20 MG tablet Take 1 tablet (20 mg total) by mouth at bedtime.  90 tablet  4  . traMADol (ULTRAM) 50 MG tablet Take 50 mg by mouth every 6 (six) hours as needed. Maximum dose= 8 tablets per day       . metoprolol tartrate (LOPRESSOR) 25 MG tablet Take 1 tablet (25 mg total) by mouth 2 (two) times daily.  60  tablet  3  . DISCONTD: traMADol (ULTRAM) 50 MG tablet Take 1 tablet (50 mg total) by mouth every 6 (six) hours as needed for pain. Maximum dose= 8 tablets per day  30 tablet  2    Exam:  BP 168/93  Pulse 80  Temp(Src) 98 F (36.7 C) (Oral)  Ht 5\' 1"  (1.549 m)  Wt 143 lb (64.864 kg)  BMI 27.02 kg/m2  SpO2 95% Gen: Well NAD HEENT: EOMI,  MMM Lungs: Nl WOB, prolonged expiratory phase with rhonchi bilaterally no wheezing Heart: RRR no MRG Abd: NABS, NT, ND Exts: Non edematous BL  LE, warm and well perfused.

## 2012-02-17 NOTE — Patient Instructions (Signed)
Thank you for coming in today. Please switch to using breathing treatments as needed and not scheduled.  Stop Carvedilol and start Metoprolol.  If you stay see me in 1 month.  If you go make sure to get established in [facility] in a doctor's office.

## 2012-02-25 HISTORY — PX: OTHER SURGICAL HISTORY: SHX169

## 2012-03-06 ENCOUNTER — Ambulatory Visit (INDEPENDENT_AMBULATORY_CARE_PROVIDER_SITE_OTHER): Payer: Medicare Other | Admitting: Family Medicine

## 2012-03-06 ENCOUNTER — Encounter: Payer: Self-pay | Admitting: Family Medicine

## 2012-03-06 VITALS — BP 138/81 | HR 72 | Temp 98.1°F | Ht 61.0 in | Wt 145.0 lb

## 2012-03-06 DIAGNOSIS — M25561 Pain in right knee: Secondary | ICD-10-CM | POA: Insufficient documentation

## 2012-03-06 DIAGNOSIS — M25569 Pain in unspecified knee: Secondary | ICD-10-CM

## 2012-03-06 MED ORDER — HYDROCODONE-ACETAMINOPHEN 5-500 MG PO TABS: 1.0000 | ORAL_TABLET | Freq: Three times a day (TID) | ORAL | Status: DC | PRN

## 2012-03-06 MED ORDER — NAPROXEN 500 MG PO TABS: ORAL_TABLET | ORAL | Status: DC

## 2012-03-06 NOTE — Assessment & Plan Note (Signed)
Exam is generally unremarkable except for diffuse tenderness to palpation.  Unclear etiology of pain, especially considering no injury, no joint effusion, etc.  I suspect this may be due to arthritic changes that have been previously unrecognized, and I will obtain x-rays to evaluate.  I will also schedule Naproxen for one week, then use PRN, and give # 20 vicodin PRN. Advised pt to ice knee too, and follow up with Dr. Denyse Amass if pain does not improve.

## 2012-03-06 NOTE — Progress Notes (Signed)
  Subjective:    Patient ID: [name] [name], female    DOB: 02-27-1946, 65 y.o.   MRN: [id number]  HPI  [name] [name] comes in for acute right knee pain.  She has never had knee problems before that she can remember, and has not had any injury, fall, twist, or change in activity that she can think of.  She says her whole knee hurts, front and back.  The pain is 10/10, it is a sharp pain.  She says she is not sure if it is swollen, red, or warm, it just hurts.    Review of Systems Pertinent items in HPI.     Objective:   Physical Exam BP 138/81  Pulse 72  Temp(Src) 98.1 F (36.7 C) (Oral)  Ht 5\' 1"  (1.549 m)  Wt 145 lb (65.772 kg)  BMI 27.40 kg/m2 General appearance: alert, cooperative and mild distress  Right Knee: Normal to inspection with no erythema or effusion or obvious bony abnormalities. Palpation normal with no warmth.  Palpation of joint line, back of knee, patella, and ligamentous testing all painful. ROM normal in flexion and extension and lower leg rotation. Ligaments with solid consistent endpoints including ACL, PCL, LCL, MCL. Negative Mcmurray's and provocative meniscal tests. Patellar and quadriceps tendons unremarkable, but patient has tenderness to touch. Hamstring and quadriceps strength is normal.  Left Knee: Normal to inspection with no erythema or effusion or obvious bony abnormalities. Palpation normal with no warmth or joint line tenderness or patellar tenderness or condyle tenderness. ROM normal in flexion and extension and lower leg rotation. Ligaments with solid consistent endpoints including ACL, PCL, LCL, MCL. Negative Mcmurray's and provocative meniscal tests. Non painful patellar compression. Patellar and quadriceps tendons unremarkable. Hamstring and quadriceps strength is normal.      Assessment & Plan:

## 2012-03-06 NOTE — Patient Instructions (Signed)
It was nice to meet you.  I am sorry your knee is hurting.  I suspect the pain is from arthritis, but I want to get some x-rays to look at your knee.   I have prescribed you naproxen, an anti-inflammatory medication, please take it twice a day for a week and then as needed.  I have also prescribed vicodin, a narcotic pain medication, please take it as needed for pain, but remember it can make you drowsy.

## 2012-03-07 ENCOUNTER — Ambulatory Visit
Admission: RE | Admit: 2012-03-07 | Discharge: 2012-03-07 | Disposition: A | Discharge status: Home / Self Care | Payer: Medicare Other | Source: Ambulatory Visit | Attending: Family Medicine | Admitting: Family Medicine

## 2012-03-07 DIAGNOSIS — M25561 Pain in right knee: Secondary | ICD-10-CM

## 2012-03-09 ENCOUNTER — Telehealth: Payer: Self-pay | Admitting: Family Medicine

## 2012-03-09 NOTE — Telephone Encounter (Signed)
Pt is asking for results of her xrays

## 2012-03-09 NOTE — Telephone Encounter (Signed)
Findings: A lateral view of the right knee shows no joint effusion.  The patellofemoral joint space appears normal.  IMPRESSION:  Negative lateral view of the right knee. Findings: Standing views of both knees were obtained. Joint spaces  are relatively preserved with perhaps minimal loss medially  bilaterally. No acute abnormality is seen.  IMPRESSION:  No acute abnormality. Perhaps minimal loss of joint space medially  bilaterally.

## 2012-03-10 NOTE — Telephone Encounter (Signed)
Pt was informed of her results and she would like to come in to be seen b/c she is still in pain. appt made with Dr. Denyse Amass for 3.18.2013 @ 1015 am.Annaleise Burger, Viann Shove

## 2012-03-13 ENCOUNTER — Ambulatory Visit
Admission: RE | Admit: 2012-03-13 | Discharge: 2012-03-13 | Disposition: A | Discharge status: Home / Self Care | Payer: Medicare Other | Source: Ambulatory Visit | Attending: Family Medicine | Admitting: Family Medicine

## 2012-03-13 ENCOUNTER — Ambulatory Visit (INDEPENDENT_AMBULATORY_CARE_PROVIDER_SITE_OTHER): Payer: Medicare Other | Admitting: Family Medicine

## 2012-03-13 ENCOUNTER — Encounter: Payer: Self-pay | Admitting: Family Medicine

## 2012-03-13 VITALS — BP 185/98 | HR 62 | Ht 61.0 in | Wt 148.9 lb

## 2012-03-13 DIAGNOSIS — M25559 Pain in unspecified hip: Secondary | ICD-10-CM

## 2012-03-13 DIAGNOSIS — G8929 Other chronic pain: Secondary | ICD-10-CM | POA: Insufficient documentation

## 2012-03-13 DIAGNOSIS — M25551 Pain in right hip: Secondary | ICD-10-CM

## 2012-03-13 MED ORDER — OXYCODONE-ACETAMINOPHEN 5-325 MG PO TABS: 1.0000 | ORAL_TABLET | Freq: Three times a day (TID) | ORAL | Status: DC | PRN

## 2012-03-13 NOTE — Progress Notes (Signed)
[name] [name] is a 66 y.o. female who presents to [facility] today for right extremity pain. Patient began experiencing right extremity pain initially in her right knee around one week ago. She was evaluated by Dr. Lula Olszewski last week and was unable to determine a cause of pain and obtain x-rays which were unrevealing.  In the interim the Mcneel has worsened and she now experiences pain from her right hip to her ankle.  Hydrocodone was not effective to control her pain.  She notes difficulty walking and has used a cane.  She denies any back pain numbness or significant weakness (not related to pain) or radicular pain to her foot.  Additionally she denies any bladder or bowel dysfunction. No fever or chills.  The pain is so severe that times she starts to cry.   PMH reviewed. Significant for COPD with frequent oral steroids.  Also significant for CLL. ROS as above otherwise neg Medications reviewed. Current Outpatient Prescriptions  Medication Sig Dispense Refill  . albuterol (PROVENTIL) (2.5 MG/3ML) 0.083% nebulizer solution Take 3 mLs (2.5 mg total) by nebulization every 6 (six) hours as needed for wheezing.  75 mL  12  . amLODipine (NORVASC) 10 MG tablet Take 1 tablet (10 mg total) by mouth daily.  90 tablet  4  . beclomethasone (QVAR) 40 MCG/ACT inhaler Inhale 2 puffs into the lungs at bedtime.  1 Inhaler  11  . cilostazol (PLETAL) 50 MG tablet Take 50 mg by mouth 2 (two) times daily. Dose per vascular surgeon      . clopidogrel (PLAVIX) 75 MG tablet Take 1 tablet (75 mg total) by mouth daily.  90 tablet  4  . cyclobenzaprine (FLEXERIL) 10 MG tablet Take 10 mg by mouth 2 (two) times daily as needed. For spasms       . enalapril (VASOTEC) 20 MG tablet Take 1 tablet (20 mg total) by mouth daily.  90 tablet  4  . fluticasone (FLONASE) 50 MCG/ACT nasal spray Place 1 spray into the nose daily as needed. Spray into both nostrils, for nasal congestion      . Fluticasone-Salmeterol (ADVAIR DISKUS) 500-50  MCG/DOSE AEPB Inhale 1 puff into the lungs 2 (two) times daily.        . hydrochlorothiazide 25 MG tablet Take 1 tablet (25 mg total) by mouth daily.  90 tablet  4  . ipratropium (ATROVENT) 0.02 % nebulizer solution Take 2.5 mLs (500 mcg total) by nebulization every 6 (six) hours as needed for wheezing.  75 mL  12  . metFORMIN (GLUCOPHAGE) 500 MG tablet Take 1 tablet (500 mg total) by mouth 2 (two) times daily.  180 tablet  4  . metoprolol tartrate (LOPRESSOR) 25 MG tablet Take 1 tablet (25 mg total) by mouth 2 (two) times daily.  60 tablet  3  . montelukast (SINGULAIR) 10 MG tablet Take 1 tablet (10 mg total) by mouth at bedtime.  90 tablet  4  . naproxen (NAPROSYN) 500 MG tablet One tablet by mouth two times daily with food for 7 days, then one tablet by mouth twice daily as needed for pain.  60 tablet  0  . oxyCODONE-acetaminophen (ROXICET) 5-325 MG per tablet Take 1-2 tablets by mouth every 8 (eight) hours as needed for pain.  30 tablet  0  . simvastatin (ZOCOR) 20 MG tablet Take 1 tablet (20 mg total) by mouth at bedtime.  90 tablet  4  . traMADol (ULTRAM) 50 MG tablet Take 50 mg by mouth  every 6 (six) hours as needed. Maximum dose= 8 tablets per day       . albuterol (PROVENTIL HFA;VENTOLIN HFA) 108 (90 BASE) MCG/ACT inhaler Inhale 2 puffs into the lungs every 6 (six) hours as needed for wheezing.  1 Inhaler  0  . DISCONTD: traMADol (ULTRAM) 50 MG tablet Take 1 tablet (50 mg total) by mouth every 6 (six) hours as needed for pain. Maximum dose= 8 tablets per day  30 tablet  2    Exam:  BP 185/98  Pulse 62  Ht 5\' 1"  (1.549 m)  Wt 148 lb 14.4 oz (67.541 kg)  BMI 28.13 kg/m2 Gen: Well NAD, in pain appearing MSK:  Nontender over spinal midline. Nontender is paraspinous muscles throughout. Unable to do straight leg raise due to hip pain,  Hip: No significant tenderness on the lateral hip.  Extreme pain with hip range of motion which is limited by pain. Knee: No effusion no joint line  tenderness. Normal range of motion one isolated from the hip.  Ankle: Normal-appearing with normal range of motion.  Patient has difficulty getting onto exam table by herself and her gait is painful. Leg lengths appear equal.  Posterior tibialis pulses are intact. Distal sensation intact.

## 2012-03-13 NOTE — Assessment & Plan Note (Signed)
Her pain and her exam are concerning for serious right hip process.  It is possible this is simple osteoarthritis however I'm concerned for stress fracture, incomplete femoral neck fracture, AVN of the hip, or septic arthritis.  Radicular pain is less likely given her normal back exam, and lack of radiculopathy to her foot.  Additionally arterial embolism is less likely given the proximal nature of her pain without distal involvement.    Plan: Assess with hip films for fracture and stress fracture, and advanced avascular necrosis.  Additionally will evaluate for septic arthritis with CBC and sed rate.   We'll followup tomorrow.  Discussed warning signs, and provide Percocet. Patient expresses understanding.

## 2012-03-13 NOTE — Patient Instructions (Signed)
Thank you for coming in today. Take get the XRAY this morning.  Use the pain medicine as needed.  I will call you this evening.  Schedule with me tomorrow.  Go to the ER if your pain becomes uncontrollable or you cannot walk at all.  Use the cain in your left hand.

## 2012-03-14 ENCOUNTER — Encounter: Payer: Self-pay | Admitting: Family Medicine

## 2012-03-14 ENCOUNTER — Ambulatory Visit (INDEPENDENT_AMBULATORY_CARE_PROVIDER_SITE_OTHER): Payer: Medicare Other | Admitting: Family Medicine

## 2012-03-14 VITALS — BP 161/84 | HR 63 | Temp 98.2°F | Ht 61.0 in | Wt 150.0 lb

## 2012-03-14 DIAGNOSIS — E119 Type 2 diabetes mellitus without complications: Secondary | ICD-10-CM

## 2012-03-14 DIAGNOSIS — M25559 Pain in unspecified hip: Secondary | ICD-10-CM

## 2012-03-14 DIAGNOSIS — M25551 Pain in right hip: Secondary | ICD-10-CM

## 2012-03-14 LAB — SEDIMENTATION RATE: Sed Rate: 4 mm/hr (ref 0–22)

## 2012-03-14 LAB — COMPLETE METABOLIC PANEL WITH GFR
ALT: 20 U/L (ref 0–35)
AST: 18 U/L (ref 0–37)
Albumin: 3.9 g/dL (ref 3.5–5.2)
Alkaline Phosphatase: 111 U/L (ref 39–117)
BUN: 10 mg/dL (ref 6–23)
CO2: 29 mEq/L (ref 19–32)
Calcium: 9.2 mg/dL (ref 8.4–10.5)
Chloride: 100 mEq/L (ref 96–112)
Creat: 0.87 mg/dL (ref 0.50–1.10)
GFR, Est African American: 81 mL/min
GFR, Est Non African American: 71 mL/min
Glucose, Bld: 248 mg/dL — ABNORMAL HIGH (ref 70–99)
Potassium: 3.7 mEq/L (ref 3.5–5.3)
Sodium: 140 mEq/L (ref 135–145)
Total Bilirubin: 0.3 mg/dL (ref 0.3–1.2)
Total Protein: 6.1 g/dL (ref 6.0–8.3)

## 2012-03-14 LAB — CBC WITH DIFFERENTIAL/PLATELET
Basophils Absolute: 0 10*3/uL (ref 0.0–0.1)
Basophils Relative: 0 % (ref 0–1)
Eosinophils Absolute: 0.1 10*3/uL (ref 0.0–0.7)
Eosinophils Relative: 1 % (ref 0–5)
HCT: 44.6 % (ref 36.0–46.0)
Hemoglobin: 14.5 g/dL (ref 12.0–15.0)
Lymphocytes Relative: 60 % — ABNORMAL HIGH (ref 12–46)
Lymphs Abs: 8.4 10*3/uL — ABNORMAL HIGH (ref 0.7–4.0)
MCH: 30.1 pg (ref 26.0–34.0)
MCHC: 32.5 g/dL (ref 30.0–36.0)
MCV: 92.5 fL (ref 78.0–100.0)
Monocytes Absolute: 1 10*3/uL (ref 0.1–1.0)
Monocytes Relative: 7 % (ref 3–12)
Neutro Abs: 4.5 10*3/uL (ref 1.7–7.7)
Neutrophils Relative %: 32 % — ABNORMAL LOW (ref 43–77)
Platelets: 265 10*3/uL (ref 150–400)
RBC: 4.82 MIL/uL (ref 3.87–5.11)
RDW: 15.7 % — ABNORMAL HIGH (ref 11.5–15.5)
WBC: 14.1 10*3/uL — ABNORMAL HIGH (ref 4.0–10.5)

## 2012-03-14 LAB — CK: Total CK: 67 U/L (ref 7–177)

## 2012-03-14 LAB — GLUCOSE, CAPILLARY: Glucose-Capillary: 232 mg/dL — ABNORMAL HIGH (ref 70–99)

## 2012-03-14 NOTE — Assessment & Plan Note (Signed)
The diagnosis at this time is not entirely clear.  I feel that septic arthritis and a complete hip fracture is very unlikely given normal erythrocyte sedimentation rate, normal for the patient white blood count, and x-rays that did not show fracture.  The possibility of a stress fracture is lessened with normal hip films but not entirely ruled out.  Additionally ischemia to the limb is less likely but still possible.  It's possible for her to have 2 areas of ischemia one in the hip and one on the lateral shin.  I plan to evaluate ischemia with a CK level, and copies of metabolic panel.  Additionally once patient to followup on Friday for a reevaluation.  If the CK level was normal and the pain is resolving well it should but this episode of pain to arthritis.  If pain is still persistent will suspect stress fracture and evaluate with CT scan.  We will continue following this issue until the pain resolves or  we establish a diagnosis.  Patient expresses understanding.

## 2012-03-14 NOTE — Patient Instructions (Signed)
Thank you for coming in today. We will do some more labs today.  Come back Friday for a recheck.  If you pain dramatically worsens call or go to the ER.

## 2012-03-14 NOTE — Progress Notes (Signed)
[name] [name] is a 66 y.o. female who presents to [facility] today for followup of her right hip pain.  She notes moderate to mild improvement of her right hip pain with oxycodone. She continues to experience pain with walking and hip range of motion.  This morning she had one episode of vomiting.  She denies any abdominal pain or dysuria.  She notes that her extremity pain is predominantly on the right  Lateral shin and in her hip. She denies any back pain numbness or weakness. Additionally she continues to deny any bowel or bladder dysfunction.    Imaging and lab reports from yesterday at our back and nondiagnostic with details below.   PMH reviewed. Significant for CLL and hypertension and frequent oral steroid use with COPD ROS as above otherwise neg Medications reviewed. Current Outpatient Prescriptions  Medication Sig Dispense Refill  . albuterol (PROVENTIL HFA;VENTOLIN HFA) 108 (90 BASE) MCG/ACT inhaler Inhale 2 puffs into the lungs every 6 (six) hours as needed for wheezing.  1 Inhaler  0  . albuterol (PROVENTIL) (2.5 MG/3ML) 0.083% nebulizer solution Take 3 mLs (2.5 mg total) by nebulization every 6 (six) hours as needed for wheezing.  75 mL  12  . amLODipine (NORVASC) 10 MG tablet Take 1 tablet (10 mg total) by mouth daily.  90 tablet  4  . beclomethasone (QVAR) 40 MCG/ACT inhaler Inhale 2 puffs into the lungs at bedtime.  1 Inhaler  11  . cilostazol (PLETAL) 50 MG tablet Take 50 mg by mouth 2 (two) times daily. Dose per vascular surgeon      . clopidogrel (PLAVIX) 75 MG tablet Take 1 tablet (75 mg total) by mouth daily.  90 tablet  4  . cyclobenzaprine (FLEXERIL) 10 MG tablet Take 10 mg by mouth 2 (two) times daily as needed. For spasms       . enalapril (VASOTEC) 20 MG tablet Take 1 tablet (20 mg total) by mouth daily.  90 tablet  4  . fluticasone (FLONASE) 50 MCG/ACT nasal spray Place 1 spray into the nose daily as needed. Spray into both nostrils, for nasal congestion      .  Fluticasone-Salmeterol (ADVAIR DISKUS) 500-50 MCG/DOSE AEPB Inhale 1 puff into the lungs 2 (two) times daily.        . hydrochlorothiazide 25 MG tablet Take 1 tablet (25 mg total) by mouth daily.  90 tablet  4  . ipratropium (ATROVENT) 0.02 % nebulizer solution Take 2.5 mLs (500 mcg total) by nebulization every 6 (six) hours as needed for wheezing.  75 mL  12  . metFORMIN (GLUCOPHAGE) 500 MG tablet Take 1 tablet (500 mg total) by mouth 2 (two) times daily.  180 tablet  4  . metoprolol tartrate (LOPRESSOR) 25 MG tablet Take 1 tablet (25 mg total) by mouth 2 (two) times daily.  60 tablet  3  . montelukast (SINGULAIR) 10 MG tablet Take 1 tablet (10 mg total) by mouth at bedtime.  90 tablet  4  . naproxen (NAPROSYN) 500 MG tablet One tablet by mouth two times daily with food for 7 days, then one tablet by mouth twice daily as needed for pain.  60 tablet  0  . oxyCODONE-acetaminophen (ROXICET) 5-325 MG per tablet Take 1-2 tablets by mouth every 8 (eight) hours as needed for pain.  30 tablet  0  . simvastatin (ZOCOR) 20 MG tablet Take 1 tablet (20 mg total) by mouth at bedtime.  90 tablet  4  . traMADol (ULTRAM)  50 MG tablet Take 50 mg by mouth every 6 (six) hours as needed. Maximum dose= 8 tablets per day       . DISCONTD: traMADol (ULTRAM) 50 MG tablet Take 1 tablet (50 mg total) by mouth every 6 (six) hours as needed for pain. Maximum dose= 8 tablets per day  30 tablet  2    Exam:  BP 161/84  Pulse 63  Temp(Src) 98.2 F (36.8 C) (Oral)  Ht 5\' 1"  (1.549 m)  Wt 150 lb (68.04 kg)  BMI 28.34 kg/m2 Gen: Well NAD Lungs: CTABL Nl WOB Heart: RRR no MRG Abd: NABS, NT, ND, no rebound Exts: Non edematous BL  LE, warm and well perfused. (With brisk capillary refill)  MSK: Back: Nontender over spinal midline. Nontender over SI joints or paraspinal muscles.  Hip: Tender on lateral iliac crest. Nontender over greater trochanter.  Pain with range of motion of the hip, including rotation and flexion.  Knee:  Normal-appearing nontender with isolated range of motion. Shin: Tender to palpation over the foot dorsiflexor muscle groups on the lateral shin.   Foot: Normal-appearing, diminished pulses but brisk capillary refill.   Sensation intact as are reflexes which are symmetric  Lab Results  Component Value Date   WBC 14.1* 03/13/2012   HGB 14.5 03/13/2012   HCT 44.6 03/13/2012   MCV 92.5 03/13/2012   PLT 265 03/13/2012   Lab Results  Component Value Date   ESRSEDRATE 4 03/13/2012     Dg Pelvis 1-2 Views  03/13/2012  *RADIOLOGY REPORT*  Clinical Data: Right-sided pain  PELVIS - 1-2 VIEW  Comparison: None.  Findings: Both hip joints appear normal.  No degenerative change or other focal lesion.  The other bones of the pelvis appear normal. Sacroiliac joints and symphysis pubis appear normal.  Iliac artery calcification is noted bilaterally as well as a stent in the right thigh.  IMPRESSION: No significant bony pathology affecting the pelvis or hips.  Original Report Authenticated By: Thomasenia Sales, M.D.   Dg Hip Complete Right  03/13/2012  *RADIOLOGY REPORT*  Clinical Data: Pain  RIGHT HIP - COMPLETE 2+ VIEW  Comparison: Same day  Findings: No joint space narrowing, osteophyte formation, avascular necrosis or other focal lesion.  Arterial calcification and right thigh stent noted.  IMPRESSION: No osseous or articular finding.  Atherosclerosis.  Original Report Authenticated By: Thomasenia Sales, M.D.

## 2012-03-17 ENCOUNTER — Ambulatory Visit (INDEPENDENT_AMBULATORY_CARE_PROVIDER_SITE_OTHER): Payer: Medicare Other | Admitting: Family Medicine

## 2012-03-17 ENCOUNTER — Encounter: Payer: Self-pay | Admitting: Family Medicine

## 2012-03-17 VITALS — BP 172/95 | HR 70 | Temp 98.2°F | Ht 61.0 in | Wt 149.5 lb

## 2012-03-17 DIAGNOSIS — M25559 Pain in unspecified hip: Secondary | ICD-10-CM

## 2012-03-17 DIAGNOSIS — M25551 Pain in right hip: Secondary | ICD-10-CM

## 2012-03-17 MED ORDER — OXYCODONE-ACETAMINOPHEN 5-325 MG PO TABS: 1.0000 | ORAL_TABLET | Freq: Three times a day (TID) | ORAL | Status: AC | PRN

## 2012-03-17 NOTE — Progress Notes (Signed)
[name] [name] is a 66 y.o. female who presents to [facility] today for followup of her right leg pain.  In the interim her hip pain has improved mildly.  She notes continued pain on the anterior muscles of the shin.  Additionally she notes gluteus pain.  She notes worse pain with ambulation, but has pain at rest.  She denies any fevers chills, night sweats, bowel or bladder dysfunction, or weakness.  She is using a cane to aid her ambulation.  She has run out of oxycodone.     PMH reviewed. Significant for peripheral arterial disease of the right extremity and heavy use of oral steroids due to COPD ROS as above otherwise neg Medications reviewed. Current Outpatient Prescriptions  Medication Sig Dispense Refill  . albuterol (PROVENTIL HFA;VENTOLIN HFA) 108 (90 BASE) MCG/ACT inhaler Inhale 2 puffs into the lungs every 6 (six) hours as needed for wheezing.  1 Inhaler  0  . albuterol (PROVENTIL) (2.5 MG/3ML) 0.083% nebulizer solution Take 3 mLs (2.5 mg total) by nebulization every 6 (six) hours as needed for wheezing.  75 mL  12  . amLODipine (NORVASC) 10 MG tablet Take 1 tablet (10 mg total) by mouth daily.  90 tablet  4  . beclomethasone (QVAR) 40 MCG/ACT inhaler Inhale 2 puffs into the lungs at bedtime.  1 Inhaler  11  . cilostazol (PLETAL) 50 MG tablet Take 50 mg by mouth 2 (two) times daily. Dose per vascular surgeon      . clopidogrel (PLAVIX) 75 MG tablet Take 1 tablet (75 mg total) by mouth daily.  90 tablet  4  . cyclobenzaprine (FLEXERIL) 10 MG tablet Take 10 mg by mouth 2 (two) times daily as needed. For spasms       . enalapril (VASOTEC) 20 MG tablet Take 1 tablet (20 mg total) by mouth daily.  90 tablet  4  . fluticasone (FLONASE) 50 MCG/ACT nasal spray Place 1 spray into the nose daily as needed. Spray into both nostrils, for nasal congestion      . Fluticasone-Salmeterol (ADVAIR DISKUS) 500-50 MCG/DOSE AEPB Inhale 1 puff into the lungs 2 (two) times daily.        . hydrochlorothiazide 25 MG  tablet Take 1 tablet (25 mg total) by mouth daily.  90 tablet  4  . ipratropium (ATROVENT) 0.02 % nebulizer solution Take 2.5 mLs (500 mcg total) by nebulization every 6 (six) hours as needed for wheezing.  75 mL  12  . metFORMIN (GLUCOPHAGE) 500 MG tablet Take 1 tablet (500 mg total) by mouth 2 (two) times daily.  180 tablet  4  . metoprolol tartrate (LOPRESSOR) 25 MG tablet Take 1 tablet (25 mg total) by mouth 2 (two) times daily.  60 tablet  3  . montelukast (SINGULAIR) 10 MG tablet Take 1 tablet (10 mg total) by mouth at bedtime.  90 tablet  4  . naproxen (NAPROSYN) 500 MG tablet One tablet by mouth two times daily with food for 7 days, then one tablet by mouth twice daily as needed for pain.  60 tablet  0  . oxyCODONE-acetaminophen (ROXICET) 5-325 MG per tablet Take 1-2 tablets by mouth every 8 (eight) hours as needed for pain.  30 tablet  0  . simvastatin (ZOCOR) 20 MG tablet Take 1 tablet (20 mg total) by mouth at bedtime.  90 tablet  4  . traMADol (ULTRAM) 50 MG tablet Take 50 mg by mouth every 6 (six) hours as needed. Maximum dose= 8 tablets  per day       . DISCONTD: traMADol (ULTRAM) 50 MG tablet Take 1 tablet (50 mg total) by mouth every 6 (six) hours as needed for pain. Maximum dose= 8 tablets per day  30 tablet  2    Exam:  BP 172/95  Pulse 70  Temp(Src) 98.2 F (36.8 C) (Oral)  Ht 5\' 1"  (1.549 m)  Wt 149 lb 8 oz (67.813 kg)  BMI 28.25 kg/m2 Gen: Well NAD MSK: Spine: Nontender over spinal midline or paraspinal muscles.  Tender to palpation of the right iliac crest on the posterior aspect.  Hip: Normal-appearing leg.  Right hip tender with range of motion exercise.  No pain on palpation over the greater trochanter.  Knee: Normal-appearing and nontender and no pain with range of motion. Lower leg: Mildly tender to palpation over the anterior lateral muscle groups of the lower leg.  Foot: Hairless, absent pulses, but brisk capillary refill. Neuro: Sensation intact over lower  extremity.  Reflexes are equal and normal bilaterally.  Patient to get onto and off of the exam table by herself.

## 2012-03-17 NOTE — Assessment & Plan Note (Signed)
Hip pain for approximately one week. We have evaluated with right knee x-ray, right hip and pelvis x-ray, CBC, erythrocyte sedimentation rate, creatinine kinase level, comprehensive metabolic panel.  All given no indication to the cause of her pain.   I suspect her pain is from one of 3 possibility.  1) peripheral arterial disease: I feel this is the most likely cause of her pain.  I think she has a proximal relative blood flow obstruction that has diminished flow to the collateral vessels.  This was initially being she has pain at rest and would likely need vascular intervention.  Plan to refer back to vascular surgeon, and will order arterial Doppler assessment. We'll followup on Monday or Tuesday.  2) stress fracture: Of the femoral neck.  Could explain her hip pain however we have assessed with plain x-rays.  Stress fracture the femoral neck explains hip pain but not leg pain.  Plan to evaluate #1, and if not likely the cause we'll proceed with a CT scan of the hip to further evaluate for stress fracture.   3) AVN of the femoral head: Very similar to #2 and would need similar evaluation perhaps with an MRI.  Also an unexplained lower leg pain.  4) radiculopathy: Possible to explain hip and lower leg pain.  However she denies any radiating to her toes and had a negative straight leg raise on Monday.  Additionally her reflexes are normal.  We'll keep this diagnosis in mind in case above evaluation is not helpful.

## 2012-03-17 NOTE — Patient Instructions (Signed)
Thank you for coming in today. Please see your vascular doctor as soon as possible.  We will go ahead and order the vascular ultrasound, but I would like you to try to be seen by the vascular doctor first. If he wants his own test just cancel mine.  See me next week, after your leg doctor, or by Wednesday or Thursday.  If you get really bad call me. I am on call in the hospital all Saturday.

## 2012-03-20 ENCOUNTER — Telehealth: Payer: Self-pay

## 2012-03-20 NOTE — Telephone Encounter (Signed)
Phone call from pt.  Reports has been having "problems with right leg for several months".   States has seen her PCP for this, and he scheduled a CT scan for 3/26.  Pt. was instructed to call Dr. Estanislado Spire office also, to report her symptoms.  Pt. states has pain "from area where stent was placed to below the knee on right leg".  (hx right SFA and popliteal stent 12/29/07)  Pt. states has "numbness and tingling at times in right leg".  Advised pt. can schedule appt. W/ Dr. Myra Gianotti, but it will be in April, since he is only in office on Mondays.  Pt. Stated she is moving to [place] by end of March.  Advised to follow through with CT scan, as ordered per her PCP, and call office back if she wants to schedule appt. in April with Dr. Myra Gianotti.  Pt. stated she will call back after seeing PCP on 3/27.

## 2012-03-21 ENCOUNTER — Ambulatory Visit (HOSPITAL_COMMUNITY)
Admission: RE | Admit: 2012-03-21 | Discharge: 2012-03-21 | Disposition: A | Discharge status: Home / Self Care | Payer: Medicare Other | Source: Ambulatory Visit | Attending: Family Medicine | Admitting: Family Medicine

## 2012-03-21 DIAGNOSIS — M79609 Pain in unspecified limb: Secondary | ICD-10-CM

## 2012-03-21 DIAGNOSIS — I739 Peripheral vascular disease, unspecified: Secondary | ICD-10-CM

## 2012-03-21 DIAGNOSIS — M25551 Pain in right hip: Secondary | ICD-10-CM

## 2012-03-21 NOTE — Progress Notes (Signed)
Bilateral lower extremity arterial duplex with ABI completed at 11:35.  Preliminary report is 0.58 is ABI on the right and 0.61 on the left.  Duplex imaging shows occlusion from the proximal to distal right femoral artery and two areas of stenosis in the left femoral artery.

## 2012-03-22 ENCOUNTER — Encounter: Payer: Self-pay | Admitting: Family Medicine

## 2012-03-22 ENCOUNTER — Ambulatory Visit (INDEPENDENT_AMBULATORY_CARE_PROVIDER_SITE_OTHER): Payer: Medicare Other | Admitting: Family Medicine

## 2012-03-22 VITALS — BP 181/90 | HR 55 | Ht 61.0 in | Wt 149.0 lb

## 2012-03-22 DIAGNOSIS — E1151 Type 2 diabetes mellitus with diabetic peripheral angiopathy without gangrene: Secondary | ICD-10-CM

## 2012-03-22 DIAGNOSIS — I798 Other disorders of arteries, arterioles and capillaries in diseases classified elsewhere: Secondary | ICD-10-CM

## 2012-03-22 DIAGNOSIS — M25551 Pain in right hip: Secondary | ICD-10-CM

## 2012-03-22 DIAGNOSIS — E1159 Type 2 diabetes mellitus with other circulatory complications: Secondary | ICD-10-CM

## 2012-03-22 DIAGNOSIS — I10 Essential (primary) hypertension: Secondary | ICD-10-CM

## 2012-03-22 DIAGNOSIS — I739 Peripheral vascular disease, unspecified: Secondary | ICD-10-CM

## 2012-03-22 DIAGNOSIS — M25559 Pain in unspecified hip: Secondary | ICD-10-CM

## 2012-03-22 MED ORDER — METOPROLOL TARTRATE 50 MG PO TABS: 50.0000 mg | ORAL_TABLET | Freq: Two times a day (BID) | ORAL | Status: DC

## 2012-03-22 MED ORDER — CYCLOBENZAPRINE HCL 10 MG PO TABS: 10.0000 mg | ORAL_TABLET | Freq: Two times a day (BID) | ORAL | Status: DC | PRN

## 2012-03-22 NOTE — Assessment & Plan Note (Signed)
Plan to recheck hemoglobin A1c in May 2013

## 2012-03-22 NOTE — Assessment & Plan Note (Signed)
Pain improved today. Vascular assessment showed worsened ABIs bilaterally but not complete blockages.  As her pain is improved I feel that we can stop workup and presumed that it was likely relatively diminished arterial blood flow causing pain. She has a followup appointment with vascular surgeon in April. This is complicated by the fact that she is moving to [place] in around a month. Hopefully she will have one final assessment with vascular surgery before she goes  if not she should reestablish care in [place].  Follow up with me in a few months if needed if not we'll send records to [facility]

## 2012-03-22 NOTE — Assessment & Plan Note (Signed)
Blood pressure continuingly elevated since switching from Coreg to metoprolol. I think I'm just underdosing the metoprolol. Plan to increase from 25 mg twice a day to 50 mg twice a day. Have asked patient to check her blood pressure in one to 2 weeks and let me know if it is elevated./ She expresses understanding.

## 2012-03-22 NOTE — Patient Instructions (Signed)
Thank you for coming in today. Please increase your metoporol to 50mg  twice a day.  Please measure your blood pressure at the pharmacy in week or 2. Let me know if it is higher than 160. You will need colon cancer screening, Mammogram and a Bone Density test at some point this year.  If you stay in [place] we will order it.  If you go to [facility] let your doctor there know.  When you get to [facility] request [place] to send records and I will write a letter. Please fill a consent for release of medical information out today so we can send records in the future.  I am glad your pain is better.  Follow up as needed.  Please make sure you have a doctor set up in [facility]. See the doctor BEFORE you get sick.

## 2012-03-22 NOTE — Progress Notes (Signed)
[name] [name] is a 66 y.o. female who presents to [facility] today for   1) right hip pain:  In the interim [name] [name] has had a arterial duplex ultrasound of her bilateral lower extremities.  Additionally her pain has resolved.  She was only able to schedule for April for the earliest appointment with a vascular doctor.  She feels well currently.  She notes some muscle spasms for which she takes Flexeril and would like a refill. She is walking normally.  2) hypertension: Currently doing well and asymptomatic without chest pain palpitations dyspnea orthopnea or edema.  A few weeks to a month ago she was switched from Coreg to metoprolol due to to her poorly controlled COPD.  She has remained hypertensive since then.    3) moving to [place]: Plans to move to [place] and around one month or sooner.  She has a physician picked out that  she would like to go to.   PMH, SH reviewed: Smoker and significant peripheral arterial disease ROS as above otherwise neg. No Chest pain, palpitations, SOB, Fever, Chills, Abd pain, N/V/D.  Medications reviewed. Current Outpatient Prescriptions  Medication Sig Dispense Refill  . albuterol (PROVENTIL HFA;VENTOLIN HFA) 108 (90 BASE) MCG/ACT inhaler Inhale 2 puffs into the lungs every 6 (six) hours as needed for wheezing.  1 Inhaler  0  . albuterol (PROVENTIL) (2.5 MG/3ML) 0.083% nebulizer solution Take 3 mLs (2.5 mg total) by nebulization every 6 (six) hours as needed for wheezing.  75 mL  12  . amLODipine (NORVASC) 10 MG tablet Take 1 tablet (10 mg total) by mouth daily.  90 tablet  4  . beclomethasone (QVAR) 40 MCG/ACT inhaler Inhale 2 puffs into the lungs at bedtime.  1 Inhaler  11  . cilostazol (PLETAL) 50 MG tablet Take 50 mg by mouth 2 (two) times daily. Dose per vascular surgeon      . clopidogrel (PLAVIX) 75 MG tablet Take 1 tablet (75 mg total) by mouth daily.  90 tablet  4  . cyclobenzaprine (FLEXERIL) 10 MG tablet Take 1 tablet (10 mg total) by  mouth 2 (two) times daily as needed for muscle spasms. For spasms  60 tablet  6  . enalapril (VASOTEC) 20 MG tablet Take 1 tablet (20 mg total) by mouth daily.  90 tablet  4  . fluticasone (FLONASE) 50 MCG/ACT nasal spray Place 1 spray into the nose daily as needed. Spray into both nostrils, for nasal congestion      . Fluticasone-Salmeterol (ADVAIR DISKUS) 500-50 MCG/DOSE AEPB Inhale 1 puff into the lungs 2 (two) times daily.        . hydrochlorothiazide 25 MG tablet Take 1 tablet (25 mg total) by mouth daily.  90 tablet  4  . ipratropium (ATROVENT) 0.02 % nebulizer solution Take 2.5 mLs (500 mcg total) by nebulization every 6 (six) hours as needed for wheezing.  75 mL  12  . metFORMIN (GLUCOPHAGE) 500 MG tablet Take 1 tablet (500 mg total) by mouth 2 (two) times daily.  180 tablet  4  . metoprolol tartrate (LOPRESSOR) 50 MG tablet Take 1 tablet (50 mg total) by mouth 2 (two) times daily.  60 tablet  3  . montelukast (SINGULAIR) 10 MG tablet Take 1 tablet (10 mg total) by mouth at bedtime.  90 tablet  4  . naproxen (NAPROSYN) 500 MG tablet One tablet by mouth two times daily with food for 7 days, then one tablet by mouth twice daily as  needed for pain.  60 tablet  0  . oxyCODONE-acetaminophen (ROXICET) 5-325 MG per tablet Take 1-2 tablets by mouth every 8 (eight) hours as needed for pain.  30 tablet  0  . simvastatin (ZOCOR) 20 MG tablet Take 1 tablet (20 mg total) by mouth at bedtime.  90 tablet  4  . traMADol (ULTRAM) 50 MG tablet Take 50 mg by mouth every 6 (six) hours as needed. Maximum dose= 8 tablets per day       . DISCONTD: cyclobenzaprine (FLEXERIL) 10 MG tablet Take 10 mg by mouth 2 (two) times daily as needed. For spasms       . DISCONTD: metoprolol tartrate (LOPRESSOR) 25 MG tablet Take 1 tablet (25 mg total) by mouth 2 (two) times daily.  60 tablet  3  . DISCONTD: traMADol (ULTRAM) 50 MG tablet Take 1 tablet (50 mg total) by mouth every 6 (six) hours as needed for pain. Maximum dose= 8  tablets per day  30 tablet  2    Exam:  BP 181/90  Pulse 55  Ht 5\' 1"  (1.549 m)  Wt 149 lb (67.586 kg)  BMI 28.15 kg/m2 Gen: Well NAD MSK: Right hip nontender normal appearing. Range of motion diminished but nontender. Strength sensation are intact. Patient is walking normally.   Noninvasive Vascular Lab  Lower Extremity Arterial Evaluation  Patient: [name] [name] MR #: [id number] Study Date: 03/21/2012 Gender: F Age: 89 Height: Weight: BSA: Pt. Status: Room:  [facility] REFERRING Denny Levy SONOGRAPHER Alesia Banda, RVT ATTENDING Clementeen Graham Reports also to:  ------------------------------------------------------------ History and indications:  Indications  729.5 Pain in limb. 443.9 Peripheral vascular disease unspecified. Diabetes, takesmedication for cholesterol and hypertension. Prior study from 10-11-07 is available for comparison. Right ABI is 0.67 and 0.83 on the left. Occlusion in the right mid to distal femoral artery with reconstitution in the popliteal artery.  ------------------------------------------------------------ Study information:  Study status: Routine. Procedure: A vascular evaluation was performed with the patient in the supine position. Image quality was adequate. Lower extremity arterial duplex study. Duplex arterial evaluation. Location: Vascular laboratory. Patient status: Outpatient.  Brachial pressures:  +--------+-----+----+---+  RightLeftMax +--------+-----+----+---+ Systolic198 189 198 +--------+-----+----+---+ Arterial pressure indices:  +-----------------+--------+--------------+----------+ Location PressureBrachial indexWaveform  +-----------------+--------+--------------+----------+ Right ant tibial Hg0.51 Monophasic +-----------------+--------+--------------+----------+ Right post tibial129mm Hg0.58  Monophasic +-----------------+--------+--------------+----------+ Left ant tibial 98mm Hg 0.49 Monophasic +-----------------+--------+--------------+----------+ Left post tibial Hg0.61 Monophasic +-----------------+--------+--------------+----------+   ------------------------------------------------------------ Summary: Bilateral: ABI indicates a moderate reduction in arterial flow. Right: Duplex imaging reveals an occlusion in the proximal to distalfemoral arterywithcollateral flow noted. Left: Duplex imaging reveals two areas (one proximally and the other mid thigh) of stenosis in the femoral artery with collateral flow noted.  Other specific details can be found in the table(s) above. Prepared and Electronically Authenticated by  [name] Ferrari, MD 2013-03-26T16:00:[id number]

## 2012-04-21 ENCOUNTER — Other Ambulatory Visit: Payer: Self-pay | Admitting: Family Medicine

## 2012-04-21 ENCOUNTER — Telehealth: Payer: Self-pay | Admitting: Family Medicine

## 2012-04-21 MED ORDER — HYDROCHLOROTHIAZIDE 25 MG PO TABS: 25.0000 mg | ORAL_TABLET | Freq: Every day | ORAL | Status: DC

## 2012-04-21 NOTE — Telephone Encounter (Signed)
Patient also needs refills on her Enalapril - [facility] on [address] - fax # [phone].

## 2012-04-24 MED ORDER — ENALAPRIL MALEATE 20 MG PO TABS: 20.0000 mg | ORAL_TABLET | Freq: Every day | ORAL | Status: DC

## 2012-04-24 NOTE — Telephone Encounter (Signed)
Completed.

## 2012-04-27 ENCOUNTER — Encounter: Payer: Self-pay | Admitting: Home Health Services

## 2012-05-03 ENCOUNTER — Other Ambulatory Visit: Payer: Self-pay | Admitting: Family Medicine

## 2012-05-03 MED ORDER — HYDROCHLOROTHIAZIDE 25 MG PO TABS: 25.0000 mg | ORAL_TABLET | Freq: Every day | ORAL | Status: DC

## 2012-05-03 NOTE — Telephone Encounter (Signed)
Refilled HCTZ. Pt has an appointment in August in [place].

## 2012-07-18 ENCOUNTER — Other Ambulatory Visit: Payer: Self-pay | Admitting: Family Medicine

## 2012-11-01 ENCOUNTER — Other Ambulatory Visit: Payer: Self-pay | Admitting: Family Medicine

## 2012-11-03 ENCOUNTER — Telehealth: Payer: Self-pay

## 2012-12-27 HISTORY — PX: COLONOSCOPY: SHX174

## 2013-02-05 ENCOUNTER — Other Ambulatory Visit: Payer: Self-pay | Admitting: Family Medicine

## 2013-11-18 NOTE — Telephone Encounter (Signed)
error 

## 2014-07-16 ENCOUNTER — Encounter (HOSPITAL_COMMUNITY): Payer: Self-pay | Admitting: Emergency Medicine

## 2014-07-16 ENCOUNTER — Emergency Department (HOSPITAL_COMMUNITY)
Admission: EM | Admit: 2014-07-16 | Discharge: 2014-07-17 | Disposition: A | Discharge status: Home / Self Care | Payer: Medicare Other | Attending: Emergency Medicine | Admitting: Emergency Medicine

## 2014-07-16 DIAGNOSIS — Z7901 Long term (current) use of anticoagulants: Secondary | ICD-10-CM | POA: Diagnosis not present

## 2014-07-16 DIAGNOSIS — Z79899 Other long term (current) drug therapy: Secondary | ICD-10-CM | POA: Insufficient documentation

## 2014-07-16 DIAGNOSIS — E1165 Type 2 diabetes mellitus with hyperglycemia: Secondary | ICD-10-CM

## 2014-07-16 DIAGNOSIS — J449 Chronic obstructive pulmonary disease, unspecified: Secondary | ICD-10-CM | POA: Insufficient documentation

## 2014-07-16 DIAGNOSIS — E785 Hyperlipidemia, unspecified: Secondary | ICD-10-CM | POA: Diagnosis not present

## 2014-07-16 DIAGNOSIS — I10 Essential (primary) hypertension: Secondary | ICD-10-CM | POA: Diagnosis not present

## 2014-07-16 DIAGNOSIS — Z87898 Personal history of other specified conditions: Secondary | ICD-10-CM | POA: Insufficient documentation

## 2014-07-16 DIAGNOSIS — J4489 Other specified chronic obstructive pulmonary disease: Secondary | ICD-10-CM | POA: Insufficient documentation

## 2014-07-16 DIAGNOSIS — E119 Type 2 diabetes mellitus without complications: Secondary | ICD-10-CM | POA: Diagnosis not present

## 2014-07-16 DIAGNOSIS — F17200 Nicotine dependence, unspecified, uncomplicated: Secondary | ICD-10-CM | POA: Diagnosis not present

## 2014-07-16 DIAGNOSIS — R7309 Other abnormal glucose: Secondary | ICD-10-CM | POA: Diagnosis present

## 2014-07-16 LAB — CBG MONITORING, ED: Glucose-Capillary: 202 mg/dL — ABNORMAL HIGH (ref 70–99)

## 2014-07-16 LAB — I-STAT CHEM 8, ED
BUN: 14 mg/dL (ref 6–23)
Calcium, Ion: 1.17 mmol/L (ref 1.13–1.30)
Chloride: 97 mEq/L (ref 96–112)
Creatinine, Ser: 1.2 mg/dL — ABNORMAL HIGH (ref 0.50–1.10)
Glucose, Bld: 173 mg/dL — ABNORMAL HIGH (ref 70–99)
HCT: 41 % (ref 36.0–46.0)
Hemoglobin: 13.9 g/dL (ref 12.0–15.0)
Potassium: 3.8 mEq/L (ref 3.7–5.3)
Sodium: 138 mEq/L (ref 137–147)
TCO2: 33 mmol/L (ref 0–100)

## 2014-07-16 NOTE — ED Notes (Signed)
Pt c/o headaches and high blood sugar starting today. Last blood sugar check at home was 304

## 2014-07-17 ENCOUNTER — Encounter (HOSPITAL_COMMUNITY): Payer: Self-pay | Admitting: Emergency Medicine

## 2014-07-17 LAB — CBG MONITORING, ED: Glucose-Capillary: 144 mg/dL — ABNORMAL HIGH (ref 70–99)

## 2014-07-17 NOTE — Discharge Instructions (Signed)
Blood Glucose Monitoring Monitoring your blood glucose (also know as blood sugar) helps you to manage your diabetes. It also helps you and your health care provider monitor your diabetes and determine how well your treatment plan is working. WHY SHOULD YOU MONITOR YOUR BLOOD GLUCOSE?  It can help you understand how food, exercise, and medicine affect your blood glucose.  It allows you to know what your blood glucose is at any given moment. You can quickly tell if you are having low blood glucose (hypoglycemia) or high blood glucose (hyperglycemia).  It can help you and your health care provider know how to adjust your medicines.  It can help you understand how to manage an illness or adjust medicine for exercise. WHEN SHOULD YOU TEST? Your health care provider will help you decide how often you should check your blood glucose. This may depend on the type of diabetes you have, your diabetes control, or the types of medicines you are taking. Be sure to write down all of your blood glucose readings so that this information can be reviewed with your health care provider. See below for examples of testing times that your health care provider may suggest. Type 1 Diabetes  Test 4 times a day if you are in good control, using an insulin pump, or perform multiple daily injections.  If your diabetes is not well-controlled or if you are sick, you may need to monitor more often.  It is a good idea to also monitor:  Before and after exercise.  Between meals and 2 hours after a meal.  Occasionally between 2:00 and 3:00 a.m. Type 2 Diabetes  It can vary with each person, but generally, if you are on insulin, test 4 times a day.  If you take medicines by mouth (orally), test 2 times a day.  If you are on a controlled diet, test once a day.  If your diabetes is not well controlled or if you are sick, you may need to monitor more often. HOW TO MONITOR YOUR BLOOD GLUCOSE Supplies Needed  Blood  glucose meter.  Test strips for your meter. Each meter has its own strips. You must use the strips that go with your own meter.  A pricking needle (lancet).  A device that holds the lancet (lancing device).  A journal or log book to write down your results. Procedure  Wash your hands with soap and water. Alcohol is not preferred.  Prick the side of your finger (not the tip) with the lancet.  Gently milk the finger until a small drop of blood appears.  Follow the instructions that come with your meter for inserting the test strip, applying blood to the strip, and using your blood glucose meter. Other Areas to Get Blood for Testing Some meters allow you to use other areas of your body (other than your finger) to test your blood. These areas are called alternative sites. The most common alternative sites are:  The forearm.  The thigh.  The back area of the lower leg.  The palm of the hand. The blood flow in these areas is slower. Therefore, the blood glucose values you get may be delayed, and the numbers are different from what you would get from your fingers. Do not use alternative sites if you think you are having hypoglycemia. Your reading will not be accurate. Always use a finger if you are having hypoglycemia. Also, if you cannot feel your lows (hypoglycemia unawareness), always use your fingers for your blood glucose  checks. ADDITIONAL TIPS FOR GLUCOSE MONITORING  Do not reuse lancets.  Always carry your supplies with you.  All blood glucose meters have a 24-hour "hotline" number to call if you have questions or need help.  Adjust (calibrate) your blood glucose meter with a control solution after finishing a few boxes of strips. BLOOD GLUCOSE RECORD KEEPING It is a good idea to keep a daily record or log of your blood glucose readings. Most glucose meters, if not all, keep your glucose records stored in the meter. Some meters come with the ability to download your records to  your home computer. Keeping a record of your blood glucose readings is especially helpful if you are wanting to look for patterns. Make notes to go along with the blood glucose readings because you might forget what happened at that exact time. Keeping good records helps you and your health care provider to work together to achieve good diabetes management.  Document Released: 12/16/2003 Document Revised: 12/18/2013 Document Reviewed: 05/07/2013 [facility] Patient Information 2015 [place], [place]. This information is not intended to replace advice given to you by your health care provider. Make sure you discuss any questions you have with your health care provider.

## 2014-07-17 NOTE — ED Provider Notes (Signed)
CSN: [id number]     Arrival date & time 07/16/14  2003 History   First MD Initiated Contact with Patient 07/16/14 2337     Chief Complaint  Patient presents with  . Hyperglycemia     (Consider location/radiation/quality/duration/timing/severity/associated sxs/prior Treatment) Patient is a 68 y.o. female presenting with hyperglycemia. The history is provided by the patient.  Hyperglycemia Severity:  Moderate Onset quality:  Gradual Timing:  Constant Progression:  Unchanged Chronicity:  Chronic Diabetes status:  Controlled with oral medications Current diabetic therapy:  Metformin Context: not change in medication and not new diabetes diagnosis   Relieved by:  Nothing Ineffective treatments:  None tried Associated symptoms: no abdominal pain, no altered mental status, no chest pain, no diaphoresis, no fever, no increased appetite, no increased thirst, no polyuria, no shortness of breath, no syncope, no vomiting and no weakness   Risk factors: no family hx of diabetes     Past Medical History  Diagnosis Date  . CLL (chronic lymphoblastic leukemia)   . COPD (chronic obstructive pulmonary disease)   . Hyperlipidemia   . Hypertension   . PVD (peripheral vascular disease)    Past Surgical History  Procedure Laterality Date  . Brain meningioma excision    . Esophageal dilation    . Cholecystectomy, laparoscopic    . Bypass grafting of rle for pad claudication     . Abi  02/2012    ABI <0.65 BL 02/2012   Family History  Problem Relation Age of Onset  . Heart disease Mother   . Diabetes Brother    History  Substance Use Topics  . Smoking status: Current Every Day Smoker -- 0.30 packs/day for 15 years    Types: Cigarettes  . Smokeless tobacco: Never Used     Comment: Pt will reduce cigarettes to just 8 a day.   . Alcohol Use: No   OB History   Grav Para Term Preterm Abortions TAB SAB Ect Mult Living                 Review of Systems  Constitutional: Negative for fever  and diaphoresis.  Respiratory: Negative for shortness of breath and wheezing.   Cardiovascular: Negative for chest pain and syncope.  Gastrointestinal: Negative for vomiting and abdominal pain.  Endocrine: Negative for polydipsia and polyuria.  Genitourinary: Negative for flank pain.  All other systems reviewed and are negative.     Allergies  Aspirin  Home Medications   Prior to Admission medications   Medication Sig Start Date End Date Taking? Authorizing Provider  cilostazol (PLETAL) 50 MG tablet Take 50 mg by mouth 2 (two) times daily. Dose per vascular surgeon   Yes Historical Provider, MD  clopidogrel (PLAVIX) 75 MG tablet Take 1 tablet (75 mg total) by mouth daily. 04/07/11  Yes Gregor Hams, MD  cyclobenzaprine (FLEXERIL) 10 MG tablet Take 10 mg by mouth at bedtime.   Yes Historical Provider, MD  enalapril (VASOTEC) 20 MG tablet Take 1 tablet (20 mg total) by mouth daily. 04/24/12  Yes Gregor Hams, MD  famotidine (PEPCID) 20 MG tablet Take 20 mg by mouth daily.   Yes Historical Provider, MD  hydrochlorothiazide (HYDRODIURIL) 25 MG tablet Take 1 tablet (25 mg total) by mouth daily. 05/03/12  Yes Gregor Hams, MD  metFORMIN (GLUCOPHAGE) 1000 MG tablet Take 1,000 mg by mouth 2 (two) times daily with a meal.   Yes Historical Provider, MD  metoprolol (LOPRESSOR) 100 MG tablet Take 100 mg by  mouth 2 (two) times daily.   Yes Historical Provider, MD  montelukast (SINGULAIR) 10 MG tablet Take 1 tablet (10 mg total) by mouth at bedtime. 04/07/11  Yes Gregor Hams, MD  simvastatin (ZOCOR) 20 MG tablet Take 1 tablet (20 mg total) by mouth at bedtime. 04/07/11  Yes Gregor Hams, MD  traMADol (ULTRAM) 50 MG tablet Take 50 mg by mouth every 8 (eight) hours as needed for moderate pain.  11/15/11 07/16/14 Yes Gregor Hams, MD   BP 149/76  Pulse 64  Temp(Src) 98.4 F (36.9 C) (Oral)  Resp 18  Ht 5\' 5"  (1.651 m)  Wt 140 lb (63.504 kg)  BMI 23.30 kg/m2  SpO2 98% Physical Exam  Constitutional:  She is oriented to person, place, and time. She appears well-developed and well-nourished. No distress.  HENT:  Head: Normocephalic and atraumatic.  Mouth/Throat: Oropharynx is clear and moist.  Eyes: Conjunctivae are normal. Pupils are equal, round, and reactive to light.  Neck: Normal range of motion. Neck supple.  Cardiovascular: Normal rate, regular rhythm and intact distal pulses.   Pulmonary/Chest: Effort normal and breath sounds normal. She has no wheezes. She has no rales.  Abdominal: Soft. Bowel sounds are normal. There is no tenderness. There is no rebound and no guarding.  Musculoskeletal: Normal range of motion.  Neurological: She is alert and oriented to person, place, and time.  Skin: Skin is warm and dry. She is not diaphoretic.  Psychiatric: She has a normal mood and affect.    ED Course  Procedures (including critical care time) Labs Review Labs Reviewed  CBG MONITORING, ED - Abnormal; Notable for the following:    Glucose-Capillary 202 (*)    All other components within normal limits  I-STAT CHEM 8, ED - Abnormal; Notable for the following:    Creatinine, Ser 1.20 (*)    Glucose, Bld 173 (*)    All other components within normal limits    Imaging Review No results found.   EKG Interpretation None      MDM   Final diagnoses:  None    Adhere to low carb diet, check sugars 4 times a day and keep log follow up with your PMD    Dacari Beckstrand K Ramone Gander-Rasch, MD 07/17/14 0006

## 2015-06-06 ENCOUNTER — Emergency Department (HOSPITAL_COMMUNITY): Payer: PPO

## 2015-06-06 ENCOUNTER — Emergency Department (HOSPITAL_COMMUNITY)
Admission: EM | Admit: 2015-06-06 | Discharge: 2015-06-06 | Disposition: A | Discharge status: Home / Self Care | Payer: PPO | Attending: Emergency Medicine | Admitting: Emergency Medicine

## 2015-06-06 ENCOUNTER — Encounter (HOSPITAL_COMMUNITY): Payer: Self-pay | Admitting: Emergency Medicine

## 2015-06-06 DIAGNOSIS — Z7902 Long term (current) use of antithrombotics/antiplatelets: Secondary | ICD-10-CM | POA: Insufficient documentation

## 2015-06-06 DIAGNOSIS — Z856 Personal history of leukemia: Secondary | ICD-10-CM | POA: Insufficient documentation

## 2015-06-06 DIAGNOSIS — Z79899 Other long term (current) drug therapy: Secondary | ICD-10-CM | POA: Diagnosis not present

## 2015-06-06 DIAGNOSIS — Z9089 Acquired absence of other organs: Secondary | ICD-10-CM | POA: Insufficient documentation

## 2015-06-06 DIAGNOSIS — R1084 Generalized abdominal pain: Secondary | ICD-10-CM | POA: Diagnosis not present

## 2015-06-06 DIAGNOSIS — I739 Peripheral vascular disease, unspecified: Secondary | ICD-10-CM | POA: Diagnosis not present

## 2015-06-06 DIAGNOSIS — E785 Hyperlipidemia, unspecified: Secondary | ICD-10-CM | POA: Diagnosis not present

## 2015-06-06 DIAGNOSIS — Z720 Tobacco use: Secondary | ICD-10-CM | POA: Diagnosis not present

## 2015-06-06 DIAGNOSIS — I10 Essential (primary) hypertension: Secondary | ICD-10-CM | POA: Diagnosis not present

## 2015-06-06 DIAGNOSIS — J449 Chronic obstructive pulmonary disease, unspecified: Secondary | ICD-10-CM | POA: Insufficient documentation

## 2015-06-06 DIAGNOSIS — R1030 Lower abdominal pain, unspecified: Secondary | ICD-10-CM

## 2015-06-06 HISTORY — DX: Type 2 diabetes mellitus without complications: E11.9

## 2015-06-06 LAB — COMPREHENSIVE METABOLIC PANEL
ALT: 12 U/L — ABNORMAL LOW (ref 14–54)
AST: 18 U/L (ref 15–41)
Albumin: 4.1 g/dL (ref 3.5–5.0)
Alkaline Phosphatase: 94 U/L (ref 38–126)
Anion gap: 9 (ref 5–15)
BUN: 10 mg/dL (ref 6–20)
CO2: 29 mmol/L (ref 22–32)
Calcium: 9.4 mg/dL (ref 8.9–10.3)
Chloride: 102 mmol/L (ref 101–111)
Creatinine, Ser: 1.29 mg/dL — ABNORMAL HIGH (ref 0.44–1.00)
GFR calc Af Amer: 49 mL/min — ABNORMAL LOW (ref >60)
GFR calc non Af Amer: 42 mL/min — ABNORMAL LOW (ref >60)
Glucose, Bld: 145 mg/dL — ABNORMAL HIGH (ref 65–99)
Potassium: 4 mmol/L (ref 3.5–5.1)
Sodium: 140 mmol/L (ref 135–145)
Total Bilirubin: 0.2 mg/dL — ABNORMAL LOW (ref 0.3–1.2)
Total Protein: 7 g/dL (ref 6.5–8.1)

## 2015-06-06 LAB — URINALYSIS, ROUTINE W REFLEX MICROSCOPIC
Bilirubin Urine: NEGATIVE
Glucose, UA: NEGATIVE mg/dL
Hgb urine dipstick: NEGATIVE
Ketones, ur: NEGATIVE mg/dL
Leukocytes, UA: NEGATIVE
Nitrite: NEGATIVE
Protein, ur: NEGATIVE mg/dL
Specific Gravity, Urine: 1.028 (ref 1.005–1.030)
Urobilinogen, UA: 0.2 mg/dL (ref 0.0–1.0)
pH: 5 (ref 5.0–8.0)

## 2015-06-06 LAB — CBC WITH DIFFERENTIAL/PLATELET
Basophils Absolute: 0 10*3/uL (ref 0.0–0.1)
Basophils Relative: 0 % (ref 0–1)
Eosinophils Absolute: 0.2 10*3/uL (ref 0.0–0.7)
Eosinophils Relative: 1 % (ref 0–5)
HCT: 41.2 % (ref 36.0–46.0)
Hemoglobin: 13.2 g/dL (ref 12.0–15.0)
Lymphocytes Relative: 60 % — ABNORMAL HIGH (ref 12–46)
Lymphs Abs: 10 10*3/uL — ABNORMAL HIGH (ref 0.7–4.0)
MCH: 29.4 pg (ref 26.0–34.0)
MCHC: 32 g/dL (ref 30.0–36.0)
MCV: 91.8 fL (ref 78.0–100.0)
Monocytes Absolute: 1.2 10*3/uL — ABNORMAL HIGH (ref 0.1–1.0)
Monocytes Relative: 7 % (ref 3–12)
Neutro Abs: 5.4 10*3/uL (ref 1.7–7.7)
Neutrophils Relative %: 32 % — ABNORMAL LOW (ref 43–77)
Platelets: 257 10*3/uL (ref 150–400)
RBC: 4.49 MIL/uL (ref 3.87–5.11)
RDW: 15.1 % (ref 11.5–15.5)
WBC: 16.8 10*3/uL — ABNORMAL HIGH (ref 4.0–10.5)

## 2015-06-06 LAB — I-STAT CG4 LACTIC ACID, ED: Lactic Acid, Venous: 1.43 mmol/L (ref 0.5–2.0)

## 2015-06-06 LAB — LIPASE, BLOOD: Lipase: 31 U/L (ref 22–51)

## 2015-06-06 LAB — PATHOLOGIST SMEAR REVIEW

## 2015-06-06 MED ORDER — IOHEXOL 350 MG/ML SOLN
100.0000 mL | Freq: Once | INTRAVENOUS | Status: AC | PRN
  Administered 2015-06-06: 100 mL via INTRAVENOUS

## 2015-06-06 MED ORDER — SODIUM CHLORIDE 0.9 % IV BOLUS (SEPSIS)
500.0000 mL | Freq: Once | INTRAVENOUS | Status: AC
  Administered 2015-06-06: 500 mL via INTRAVENOUS

## 2015-06-06 MED ORDER — DOCUSATE SODIUM 250 MG PO CAPS: 250.0000 mg | ORAL_CAPSULE | Freq: Every day | ORAL | Status: DC | PRN

## 2015-06-06 MED ORDER — OXYCODONE-ACETAMINOPHEN 5-325 MG PO TABS: 1.0000 | ORAL_TABLET | Freq: Four times a day (QID) | ORAL | Status: DC | PRN

## 2015-06-06 MED ORDER — HYDROMORPHONE HCL 1 MG/ML IJ SOLN
0.5000 mg | Freq: Once | INTRAMUSCULAR | Status: AC
  Administered 2015-06-06: 0.5 mg via INTRAVENOUS
  Filled 2015-06-06: qty 1

## 2015-06-06 NOTE — Discharge Instructions (Signed)
Abdominal Pain, Women °Abdominal (stomach, pelvic, or belly) pain can be caused by many things. It is important to tell your doctor: °· The location of the pain. °· Does it come and go or is it present all the time? °· Are there things that start the pain (eating certain foods, exercise)? °· Are there other symptoms associated with the pain (fever, nausea, vomiting, diarrhea)? °All of this is helpful to know when trying to find the cause of the pain. °CAUSES  °· Stomach: virus or bacteria infection, or ulcer. °· Intestine: appendicitis (inflamed appendix), regional ileitis (Crohn's disease), ulcerative colitis (inflamed colon), irritable bowel syndrome, diverticulitis (inflamed diverticulum of the colon), or cancer of the stomach or intestine. °· Gallbladder disease or stones in the gallbladder. °· Kidney disease, kidney stones, or infection. °· Pancreas infection or cancer. °· Fibromyalgia (pain disorder). °· Diseases of the female organs: °¨ Uterus: fibroid (non-cancerous) tumors or infection. °¨ Fallopian tubes: infection or tubal pregnancy. °¨ Ovary: cysts or tumors. °¨ Pelvic adhesions (scar tissue). °¨ Endometriosis (uterus lining tissue growing in the pelvis and on the pelvic organs). °¨ Pelvic congestion syndrome (female organs filling up with blood just before the menstrual period). °¨ Pain with the menstrual period. °¨ Pain with ovulation (producing an egg). °¨ Pain with an IUD (intrauterine device, birth control) in the uterus. °¨ Cancer of the female organs. °· Functional pain (pain not caused by a disease, may improve without treatment). °· Psychological pain. °· Depression. °DIAGNOSIS  °Your doctor will decide the seriousness of your pain by doing an examination. °· Blood tests. °· X-rays. °· Ultrasound. °· CT scan (computed tomography, special type of X-ray). °· MRI (magnetic resonance imaging). °· Cultures, for infection. °· Barium enema (dye inserted in the large intestine, to better view it with  X-rays). °· Colonoscopy (looking in intestine with a lighted tube). °· Laparoscopy (minor surgery, looking in abdomen with a lighted tube). °· Major abdominal exploratory surgery (looking in abdomen with a large incision). °TREATMENT  °The treatment will depend on the cause of the pain.  °· Many cases can be observed and treated at home. °· Over-the-counter medicines recommended by your caregiver. °· Prescription medicine. °· Antibiotics, for infection. °· Birth control pills, for painful periods or for ovulation pain. °· Hormone treatment, for endometriosis. °· Nerve blocking injections. °· Physical therapy. °· Antidepressants. °· Counseling with a psychologist or psychiatrist. °· Minor or major surgery. °HOME CARE INSTRUCTIONS  °· Do not take laxatives, unless directed by your caregiver. °· Take over-the-counter pain medicine only if ordered by your caregiver. Do not take aspirin because it can cause an upset stomach or bleeding. °· Try a clear liquid diet (broth or water) as ordered by your caregiver. Slowly move to a bland diet, as tolerated, if the pain is related to the stomach or intestine. °· Have a thermometer and take your temperature several times a day, and record it. °· Bed rest and sleep, if it helps the pain. °· Avoid sexual intercourse, if it causes pain. °· Avoid stressful situations. °· Keep your follow-up appointments and tests, as your caregiver orders. °· If the pain does not go away with medicine or surgery, you may try: °¨ Acupuncture. °¨ Relaxation exercises (yoga, meditation). °¨ Group therapy. °¨ Counseling. °SEEK MEDICAL CARE IF:  °· You notice certain foods cause stomach pain. °· Your home care treatment is not helping your pain. °· You need stronger pain medicine. °· You want your IUD removed. °· You feel faint or   lightheaded. °· You develop nausea and vomiting. °· You develop a rash. °· You are having side effects or an allergy to your medicine. °SEEK IMMEDIATE MEDICAL CARE IF:  °· Your  pain does not go away or gets worse. °· You have a fever. °· Your pain is felt only in portions of the abdomen. The right side could possibly be appendicitis. The left lower portion of the abdomen could be colitis or diverticulitis. °· You are passing blood in your stools (bright red or black tarry stools, with or without vomiting). °· You have blood in your urine. °· You develop chills, with or without a fever. °· You pass out. °MAKE SURE YOU:  °· Understand these instructions. °· Will watch your condition. °· Will get help right away if you are not doing well or get worse. °Document Released: 10/10/2007 Document Revised: 04/29/2014 Document Reviewed: 10/30/2009 °[employer]® Patient Information ©2015 [employer]. This information is not intended to replace advice given to you by your health care provider. Make sure you discuss any questions you have with your health care provider. ° °

## 2015-06-06 NOTE — ED Provider Notes (Signed)
CSN: [id number]     Arrival date & time 06/06/15  5956 History   First MD Initiated Contact with Patient 06/06/15 0902     Chief Complaint  Patient presents with  . Abdominal Pain     (Consider location/radiation/quality/duration/timing/severity/associated sxs/prior Treatment) Patient is a 69 y.o. female presenting with abdominal pain. The history is provided by the patient.  Abdominal Pain Pain location: lower abdomen. Pain quality: aching   Pain radiates to:  Does not radiate Pain severity:  Moderate Onset quality:  Gradual Duration:  3 days Timing:  Constant Progression:  Waxing and waning Chronicity:  New Context comment:  Spontaneously Relieved by:  Nothing Worsened by:  Nothing tried Ineffective treatments:  None tried Associated symptoms: no chest pain, no cough, no diarrhea, no dysuria, no fatigue, no fever, no hematuria, no nausea, no shortness of breath and no vomiting     Past Medical History  Diagnosis Date  . CLL (chronic lymphoblastic leukemia)   . COPD (chronic obstructive pulmonary disease)   . Hyperlipidemia   . Hypertension   . PVD (peripheral vascular disease)    Past Surgical History  Procedure Laterality Date  . Brain meningioma excision    . Esophageal dilation    . Cholecystectomy, laparoscopic    . Bypass grafting of rle for pad claudication     . Abi  02/2012    ABI <0.65 BL 02/2012   Family History  Problem Relation Age of Onset  . Heart disease Mother   . Diabetes Brother    History  Substance Use Topics  . Smoking status: Current Every Day Smoker -- 0.30 packs/day for 15 years    Types: Cigarettes  . Smokeless tobacco: Never Used     Comment: Pt will reduce cigarettes to just 8 a day.   . Alcohol Use: No   OB History    No data available     Review of Systems  Constitutional: Negative for fever and fatigue.  HENT: Negative for congestion and drooling.   Eyes: Negative for pain.  Respiratory: Negative for cough and shortness  of breath.   Cardiovascular: Negative for chest pain.  Gastrointestinal: Positive for abdominal pain. Negative for nausea, vomiting and diarrhea.  Genitourinary: Negative for dysuria and hematuria.  Musculoskeletal: Negative for back pain, gait problem and neck pain.  Skin: Negative for color change.  Neurological: Negative for dizziness and headaches.  Hematological: Negative for adenopathy.  Psychiatric/Behavioral: Negative for behavioral problems.  All other systems reviewed and are negative.     Allergies  Aspirin  Home Medications   Prior to Admission medications   Medication Sig Start Date End Date Taking? Authorizing Provider  cilostazol (PLETAL) 50 MG tablet Take 50 mg by mouth 2 (two) times daily. Dose per vascular surgeon    Historical Provider, MD  clopidogrel (PLAVIX) 75 MG tablet Take 1 tablet (75 mg total) by mouth daily. 04/07/11   Gregor Hams, MD  cyclobenzaprine (FLEXERIL) 10 MG tablet Take 10 mg by mouth at bedtime.    Historical Provider, MD  enalapril (VASOTEC) 20 MG tablet Take 1 tablet (20 mg total) by mouth daily. 04/24/12   Gregor Hams, MD  famotidine (PEPCID) 20 MG tablet Take 20 mg by mouth daily.    Historical Provider, MD  hydrochlorothiazide (HYDRODIURIL) 25 MG tablet Take 1 tablet (25 mg total) by mouth daily. 05/03/12   Gregor Hams, MD  metFORMIN (GLUCOPHAGE) 1000 MG tablet Take 1,000 mg by mouth 2 (two) times daily  with a meal.    Historical Provider, MD  metoprolol (LOPRESSOR) 100 MG tablet Take 100 mg by mouth 2 (two) times daily.    Historical Provider, MD  montelukast (SINGULAIR) 10 MG tablet Take 1 tablet (10 mg total) by mouth at bedtime. 04/07/11   Gregor Hams, MD  simvastatin (ZOCOR) 20 MG tablet Take 1 tablet (20 mg total) by mouth at bedtime. 04/07/11   Gregor Hams, MD  traMADol (ULTRAM) 50 MG tablet Take 50 mg by mouth every 8 (eight) hours as needed for moderate pain.  11/15/11 07/16/14  Gregor Hams, MD   BP 151/85 mmHg  Pulse 115  Resp 18   SpO2 100% Physical Exam  Constitutional: She is oriented to person, place, and time. She appears well-developed and well-nourished.  HENT:  Head: Normocephalic.  Mouth/Throat: Oropharynx is clear and moist. No oropharyngeal exudate.  Eyes: Conjunctivae and EOM are normal. Pupils are equal, round, and reactive to light.  Neck: Normal range of motion. Neck supple.  Cardiovascular: Normal rate, regular rhythm, normal heart sounds and intact distal pulses.  Exam reveals no gallop and no friction rub.   No murmur heard. Pulmonary/Chest: Effort normal and breath sounds normal. No respiratory distress. She has no wheezes.  Abdominal: Soft. Bowel sounds are normal. There is tenderness (diffuse mild ttp of lower abdomen). There is no rebound and no guarding.  Musculoskeletal: Normal range of motion. She exhibits no edema or tenderness.  2+ DP pulses and distal bilateral lower extremities.  Lower extremities appear well perfused with normal capillary refill bilaterally.  Neurological: She is alert and oriented to person, place, and time.  Skin: Skin is warm and dry.  Psychiatric: She has a normal mood and affect. Her behavior is normal.  Nursing note and vitals reviewed.   ED Course  Procedures (including critical care time) Labs Review Labs Reviewed  CBC WITH DIFFERENTIAL/PLATELET - Abnormal; Notable for the following:    WBC 16.8 (*)    Neutrophils Relative % 32 (*)    Lymphocytes Relative 60 (*)    Lymphs Abs 10.0 (*)    Monocytes Absolute 1.2 (*)    All other components within normal limits  COMPREHENSIVE METABOLIC PANEL - Abnormal; Notable for the following:    Glucose, Bld 145 (*)    Creatinine, Ser 1.29 (*)    ALT 12 (*)    Total Bilirubin 0.2 (*)    GFR calc non Af Amer 42 (*)    GFR calc Af Amer 49 (*)    All other components within normal limits  URINALYSIS, ROUTINE W REFLEX MICROSCOPIC (NOT AT [facility])  LIPASE, BLOOD  PATHOLOGIST SMEAR REVIEW  I-STAT CG4 LACTIC ACID, ED   I-STAT CG4 LACTIC ACID, ED    Imaging Review Ct Cta Abd/pel W/cm &/or W/o Cm  06/06/2015   CLINICAL DATA:  lower abdominal pain that started on Tuesday-no N/V/D"Pt states that the pain is getting worse. No bowel changes. Afebrile.Hx CLL, COPD, htn, PVD and bypass grafting of rt lower extremity, cholecystectomy  EXAM: CT ANGIOGRAPHY ABDOMEN AND PELVIS  TECHNIQUE: Multidetector CT imaging of the abdomen and pelvis was performed using the standard protocol during bolus administration of intravenous contrast. Multiplanar reconstructed images including MIPs were obtained and reviewed to evaluate the vascular anatomy.  CONTRAST:  115mL OMNIPAQUE IOHEXOL 350 MG/ML SOLN  COMPARISON:  COMPARISON 11/12/2011  FINDINGS: ARTERIAL FINDINGS:  Aorta: Scattered atheromatous plaque, worst in the infrarenal segment. No aneurysm, dissection, or stenosis.  Celiac axis:  Widely patent  Superior mesenteric:  Widely patent, classic distal branch anatomy.  Left renal:           Single, patent  Right renal: Somewhat beaded appearance proximally, patent distally.  Inferior mesenteric: Patent, with origin stenosis related to aortic wall plaque.  Left iliac: Partially calcified origin plaque without high-grade stenosis. Stent from the mid common iliac to the proximal external iliac artery, patent. Coil embolization of the internal iliac. Mild eccentric plaque in the distal external iliac without high-grade stenosis. Common femoral artery patent. Origin stenosis of the profunda femoris. Origin stenosis of the native SFA. Patent graft just beyond the SFA stenosis, which is patent proximally.  Right iliac: Eccentric plaque resulting in short segment origin stenosis of the common iliac, of possible hemodynamic significance. Patent stent in the mid and distal common iliac. Atheromatous plaque about the common iliac bifurcation, with mild irregularity and luminal stenosis. Patent overlapping stents in the mid proximal and mid external  iliac artery. Mild stenosis in the distal external iliac artery. Eccentric plaque in the common femoral artery. Origin occlusion of the SFA. Profunda femoris origin stenosis.  Venous findings: Dedicated venous phase imaging not obtained. Patent bilateral renal veins, splenic vein, and portal vein noted.  Review of the MIP images confirms the above findings.  Nonvascular findings: Visualized lung bases clear. Unremarkable arterial phase evaluation of liver, spleen, adrenal glands, kidneys, pancreas. Surgical clips in the gallbladder fossa. Mild prominence of the CBD. Hiatal hernia versus thick-walled distal esophagus. Stomach, small bowel, and colon are nondilated. Normal appendix. Urinary bladder physiologically distended. Uterus and adnexal regions unremarkable. No ascites. No free air. No adenopathy. Lumbar spine grossly unremarkable.  IMPRESSION: 1. Atheromatous abdominal aorta with origin stenosis of the internal iliac artery, likely insufficient to result in proximal occlusive mesenteric ischemia. 2. Patent left iliac stent. 3. Patent proximal aspect of left fem distal graft. 4. Patent right external and external iliac stents, with tandem stenoses of the common iliac origin, common iliac bifurcation, and distal external iliac artery. 5. Origin occlusion of right SFA.   Electronically Signed   By: Lucrezia Europe M.D.   On: 06/06/2015 11:30     EKG Interpretation None      MDM   Final diagnoses:  Lower abdominal pain    9:23 AM 69 y.o. female w hx of CLL, COPD, PVD s/p bypass grafting of RLE for PAD claudication on plavix who pw lower abd pain for 3 days. She denies any nausea, vomiting, or diarrhea. Mildly tachycardic here, vital signs otherwise unremarkable. Will get symptom control and CT scan of abdomen.  2:52 PM: Pain improved. Lactate normal. Do not think pt has mesenteric ischemia. She ate a sandwich and ginger ale here.  I have discussed the diagnosis/risks/treatment options with the patient  and family and believe the pt to be eligible for discharge home to follow-up with and establish w/ a local pcp now that she is living in the area again. We also discussed returning to the ED immediately if new or worsening sx occur. We discussed the sx which are most concerning (e.g., worsening pain, fever, intractable vomiting) that necessitate immediate return. Medications administered to the patient during their visit and any new prescriptions provided to the patient are listed below.  Medications given during this visit Medications  HYDROmorphone (DILAUDID) injection 0.5 mg (0.5 mg Intravenous Given 06/06/15 0945)  sodium chloride 0.9 % bolus 500 mL (0 mLs Intravenous Stopped 06/06/15 1138)  iohexol (OMNIPAQUE) 350 MG/ML injection 100 mL (  100 mLs Intravenous Contrast Given 06/06/15 1030)  HYDROmorphone (DILAUDID) injection 0.5 mg (0.5 mg Intravenous Given 06/06/15 1136)    New Prescriptions   DOCUSATE SODIUM (COLACE) 250 MG CAPSULE    Take 1 capsule (250 mg total) by mouth daily as needed for constipation.   OXYCODONE-ACETAMINOPHEN (PERCOCET) 5-325 MG PER TABLET    Take 1-2 tablets by mouth every 6 (six) hours as needed for moderate pain.     Pamella Pert, MD 06/06/15 [phone]

## 2015-06-06 NOTE — ED Notes (Signed)
Pt alert, oriented, and wheeled to the lobby by [facility] [employer]. Pt was advised to follow up with PCP. Husband carries DC papers in hand with prescription for percocet and colace.

## 2015-06-06 NOTE — ED Notes (Signed)
Per pt, states lower abdominal pain that started on Tuesday-no N/V/D

## 2015-06-06 NOTE — ED Notes (Signed)
Pt given [place] sandwich and ginger ale. Pt tolerated PO intake well.

## 2015-06-24 ENCOUNTER — Emergency Department (HOSPITAL_COMMUNITY): Payer: PPO

## 2015-06-24 ENCOUNTER — Emergency Department (HOSPITAL_COMMUNITY)
Admission: EM | Admit: 2015-06-24 | Discharge: 2015-06-24 | Disposition: A | Discharge status: Home / Self Care | Payer: PPO | Attending: Emergency Medicine | Admitting: Emergency Medicine

## 2015-06-24 ENCOUNTER — Encounter (HOSPITAL_COMMUNITY): Payer: Self-pay | Admitting: Emergency Medicine

## 2015-06-24 DIAGNOSIS — R200 Anesthesia of skin: Secondary | ICD-10-CM | POA: Diagnosis present

## 2015-06-24 DIAGNOSIS — G8929 Other chronic pain: Secondary | ICD-10-CM | POA: Diagnosis not present

## 2015-06-24 DIAGNOSIS — E119 Type 2 diabetes mellitus without complications: Secondary | ICD-10-CM | POA: Diagnosis not present

## 2015-06-24 DIAGNOSIS — I10 Essential (primary) hypertension: Secondary | ICD-10-CM | POA: Diagnosis not present

## 2015-06-24 DIAGNOSIS — J449 Chronic obstructive pulmonary disease, unspecified: Secondary | ICD-10-CM | POA: Insufficient documentation

## 2015-06-24 DIAGNOSIS — R51 Headache: Secondary | ICD-10-CM | POA: Diagnosis not present

## 2015-06-24 DIAGNOSIS — Z856 Personal history of leukemia: Secondary | ICD-10-CM | POA: Diagnosis not present

## 2015-06-24 DIAGNOSIS — Z720 Tobacco use: Secondary | ICD-10-CM | POA: Diagnosis not present

## 2015-06-24 DIAGNOSIS — Z79899 Other long term (current) drug therapy: Secondary | ICD-10-CM | POA: Diagnosis not present

## 2015-06-24 DIAGNOSIS — E785 Hyperlipidemia, unspecified: Secondary | ICD-10-CM | POA: Insufficient documentation

## 2015-06-24 DIAGNOSIS — Z7902 Long term (current) use of antithrombotics/antiplatelets: Secondary | ICD-10-CM | POA: Insufficient documentation

## 2015-06-24 DIAGNOSIS — R519 Headache, unspecified: Secondary | ICD-10-CM

## 2015-06-24 LAB — CBC
HCT: 40.2 % (ref 36.0–46.0)
Hemoglobin: 13.1 g/dL (ref 12.0–15.0)
MCH: 29.4 pg (ref 26.0–34.0)
MCHC: 32.6 g/dL (ref 30.0–36.0)
MCV: 90.1 fL (ref 78.0–100.0)
Platelets: 234 10*3/uL (ref 150–400)
RBC: 4.46 MIL/uL (ref 3.87–5.11)
RDW: 14.5 % (ref 11.5–15.5)
WBC: 16.5 10*3/uL — ABNORMAL HIGH (ref 4.0–10.5)

## 2015-06-24 LAB — BASIC METABOLIC PANEL
Anion gap: 9 (ref 5–15)
BUN: 16 mg/dL (ref 6–20)
CO2: 28 mmol/L (ref 22–32)
Calcium: 9.3 mg/dL (ref 8.9–10.3)
Chloride: 101 mmol/L (ref 101–111)
Creatinine, Ser: 1.24 mg/dL — ABNORMAL HIGH (ref 0.44–1.00)
GFR calc Af Amer: 51 mL/min — ABNORMAL LOW (ref >60)
GFR calc non Af Amer: 44 mL/min — ABNORMAL LOW (ref >60)
Glucose, Bld: 149 mg/dL — ABNORMAL HIGH (ref 65–99)
Potassium: 3.7 mmol/L (ref 3.5–5.1)
Sodium: 138 mmol/L (ref 135–145)

## 2015-06-24 LAB — I-STAT TROPONIN, ED: Troponin i, poc: 0 ng/mL (ref 0.00–0.08)

## 2015-06-24 MED ORDER — MORPHINE SULFATE 2 MG/ML IJ SOLN
2.0000 mg | Freq: Once | INTRAMUSCULAR | Status: AC
  Administered 2015-06-24: 2 mg via INTRAVENOUS
  Filled 2015-06-24: qty 1

## 2015-06-24 MED ORDER — METOCLOPRAMIDE HCL 5 MG/ML IJ SOLN
5.0000 mg | Freq: Once | INTRAMUSCULAR | Status: AC
  Administered 2015-06-24: 5 mg via INTRAVENOUS
  Filled 2015-06-24: qty 2

## 2015-06-24 MED ORDER — DIPHENHYDRAMINE HCL 50 MG/ML IJ SOLN
12.5000 mg | Freq: Once | INTRAMUSCULAR | Status: AC
  Administered 2015-06-24: 12.5 mg via INTRAVENOUS
  Filled 2015-06-24: qty 1

## 2015-06-24 MED ORDER — SODIUM CHLORIDE 0.9 % IV SOLN
INTRAVENOUS | Status: DC
  Administered 2015-06-24: 18:00:00 via INTRAVENOUS

## 2015-06-24 NOTE — ED Notes (Signed)
MD made aware of patient.  

## 2015-06-24 NOTE — ED Provider Notes (Signed)
CSN: [id number]     Arrival date & time 06/24/15  1644 History   First MD Initiated Contact with Patient 06/24/15 1740     Chief Complaint  Patient presents with  . Numbness  . Migraine     (Consider location/radiation/quality/duration/timing/severity/associated sxs/prior Treatment) HPI Comments: Pt has h/o chronic  Frontal HA since CVA--now with same--no fever, emesis, new neuro deficits Out of her meds x 3 days  Patient is a 69 y.o. female presenting with migraines. The history is provided by the patient.  Migraine This is a recurrent problem. The current episode started 6 to 12 hours ago. The problem occurs every several days. The problem has not changed since onset.Associated symptoms include headaches. The symptoms are aggravated by coughing. Nothing relieves the symptoms. She has tried nothing for the symptoms.    Past Medical History  Diagnosis Date  . CLL (chronic lymphoblastic leukemia)   . COPD (chronic obstructive pulmonary disease)   . Hyperlipidemia   . Hypertension   . PVD (peripheral vascular disease)   . Diabetes mellitus without complication    Past Surgical History  Procedure Laterality Date  . Brain meningioma excision    . Esophageal dilation    . Cholecystectomy, laparoscopic    . Bypass grafting of rle for pad claudication     . Abi  02/2012    ABI <0.65 BL 02/2012   Family History  Problem Relation Age of Onset  . Heart disease Mother   . Diabetes Brother    History  Substance Use Topics  . Smoking status: Current Every Day Smoker -- 0.30 packs/day for 15 years    Types: Cigarettes  . Smokeless tobacco: Never Used     Comment: Pt will reduce cigarettes to just 8 a day.   . Alcohol Use: No   OB History    No data available     Review of Systems  Neurological: Positive for headaches.  All other systems reviewed and are negative.     Allergies  Aspirin and Baclofen  Home Medications   Prior to Admission medications   Medication Sig  Start Date End Date Taking? Authorizing Provider  clopidogrel (PLAVIX) 75 MG tablet Take 1 tablet (75 mg total) by mouth daily. 04/07/11  Yes Gregor Hams, MD  docusate sodium (COLACE) 250 MG capsule Take 1 capsule (250 mg total) by mouth daily as needed for constipation. 06/06/15  Yes Pamella Pert, MD  enalapril (VASOTEC) 20 MG tablet Take 1 tablet (20 mg total) by mouth daily. Patient taking differently: Take 20 mg by mouth 2 (two) times daily.  04/24/12  Yes Gregor Hams, MD  hydrochlorothiazide (HYDRODIURIL) 25 MG tablet Take 1 tablet (25 mg total) by mouth daily. 05/03/12  Yes Gregor Hams, MD  montelukast (SINGULAIR) 10 MG tablet Take 1 tablet (10 mg total) by mouth at bedtime. 04/07/11  Yes Gregor Hams, MD  albuterol (PROVENTIL HFA;VENTOLIN HFA) 108 (90 BASE) MCG/ACT inhaler Inhale 2 puffs into the lungs every 4 (four) hours as needed for wheezing or shortness of breath.    Historical Provider, MD  albuterol (PROVENTIL) (2.5 MG/3ML) 0.083% nebulizer solution Take 2.5 mg by nebulization every 4 (four) hours as needed for wheezing or shortness of breath.    Historical Provider, MD  cilostazol (PLETAL) 50 MG tablet Take 50 mg by mouth 2 (two) times daily.     Historical Provider, MD  cyclobenzaprine (FLEXERIL) 10 MG tablet Take 10 mg by mouth 2 (two) times daily.  Historical Provider, MD  famotidine (PEPCID) 20 MG tablet Take 20 mg by mouth daily.    Historical Provider, MD  Fluticasone-Salmeterol (ADVAIR DISKUS) 250-50 MCG/DOSE AEPB Inhale 1 puff into the lungs 2 (two) times daily as needed (For asthma.).    Historical Provider, MD  metFORMIN (GLUCOPHAGE) 1000 MG tablet Take 1,000 mg by mouth 2 (two) times daily with a meal.    Historical Provider, MD  metoprolol (LOPRESSOR) 100 MG tablet Take 100 mg by mouth 2 (two) times daily with a meal.     Historical Provider, MD  oxyCODONE-acetaminophen (PERCOCET) 5-325 MG per tablet Take 1-2 tablets by mouth every 6 (six) hours as needed for moderate  pain. 06/06/15   Pamella Pert, MD  simvastatin (ZOCOR) 20 MG tablet Take 1 tablet (20 mg total) by mouth at bedtime. Patient taking differently: Take 20 mg by mouth daily.  04/07/11   Gregor Hams, MD  traMADol (ULTRAM) 50 MG tablet Take 50 mg by mouth every 6 (six) hours as needed (For pain.).    Historical Provider, MD   BP 141/122 mmHg  Pulse 95  Temp(Src) 99.1 F (37.3 C) (Oral)  Resp 17  SpO2 100% Physical Exam  Constitutional: She is oriented to person, place, and time. She appears well-developed and well-nourished.  Non-toxic appearance. No distress.  HENT:  Head: Normocephalic and atraumatic.  Eyes: Conjunctivae, EOM and lids are normal. Pupils are equal, round, and reactive to light.  Neck: Normal range of motion. Neck supple. No tracheal deviation present. No thyroid mass present.  Cardiovascular: Normal rate, regular rhythm and normal heart sounds.  Exam reveals no gallop.   No murmur heard. Pulmonary/Chest: Effort normal and breath sounds normal. No stridor. No respiratory distress. She has no decreased breath sounds. She has no wheezes. She has no rhonchi. She has no rales.  Abdominal: Soft. Normal appearance and bowel sounds are normal. She exhibits no distension. There is no tenderness. There is no rebound and no CVA tenderness.  Musculoskeletal: Normal range of motion. She exhibits no edema or tenderness.  Neurological: She is alert and oriented to person, place, and time. She displays no tremor. No cranial nerve deficit or sensory deficit. GCS eye subscore is 4. GCS verbal subscore is 5. GCS motor subscore is 6.  Baseline right sided weakness is unchanged  Skin: Skin is warm and dry. No abrasion and no rash noted.  Psychiatric: She has a normal mood and affect. Her speech is normal and behavior is normal.  Nursing note and vitals reviewed.   ED Course  Procedures (including critical care time) Labs Review Labs Reviewed  [place], ED    Imaging Review No results found.   EKG Interpretation   Date/Time:  Tuesday June 24 2015 17:01:29 EDT Ventricular Rate:  95 PR Interval:  151 QRS Duration: 80 QT Interval:  336 QTC Calculation: 422 R Axis:   72 Text Interpretation:  Sinus rhythm Borderline repolarization abnormality  Confirmed by Zenia Resides  MD, Karina Nofsinger ([id number]) on 06/24/2015 7:18:47 PM      MDM   Final diagnoses:  Headache    Patient states that she has a history of chronic headaches 1 month that this is no different. She is out of her medications for several weeks and her husband was able to get them filled today. Have instructed her to take her medications as directed. Repeat neurological exam at time of discharge remained stable. She has no red flags for subarachnoid  hemorrhage or new CVA. She is stable for discharge    Lacretia Leigh, MD 06/24/15 2005

## 2015-06-24 NOTE — ED Notes (Signed)
Pt states she woke up this morning at 0800 with right arm numbness and a splitting headache. Pt states she's been out of her medication (including BP) for almost a week now.

## 2015-06-24 NOTE — Discharge Instructions (Signed)

## 2015-10-02 ENCOUNTER — Encounter: Payer: Self-pay | Admitting: Internal Medicine

## 2015-10-02 ENCOUNTER — Ambulatory Visit (INDEPENDENT_AMBULATORY_CARE_PROVIDER_SITE_OTHER): Payer: PPO | Admitting: Internal Medicine

## 2015-10-02 VITALS — BP 140/63 | HR 51 | Temp 98.3°F | Ht 61.5 in | Wt 133.0 lb

## 2015-10-02 DIAGNOSIS — E7800 Pure hypercholesterolemia, unspecified: Secondary | ICD-10-CM

## 2015-10-02 DIAGNOSIS — I739 Peripheral vascular disease, unspecified: Secondary | ICD-10-CM

## 2015-10-02 DIAGNOSIS — E108 Type 1 diabetes mellitus with unspecified complications: Secondary | ICD-10-CM | POA: Diagnosis not present

## 2015-10-02 DIAGNOSIS — G44219 Episodic tension-type headache, not intractable: Secondary | ICD-10-CM

## 2015-10-02 DIAGNOSIS — I10 Essential (primary) hypertension: Secondary | ICD-10-CM

## 2015-10-02 DIAGNOSIS — K219 Gastro-esophageal reflux disease without esophagitis: Secondary | ICD-10-CM

## 2015-10-02 DIAGNOSIS — G8929 Other chronic pain: Secondary | ICD-10-CM

## 2015-10-02 DIAGNOSIS — E1151 Type 2 diabetes mellitus with diabetic peripheral angiopathy without gangrene: Secondary | ICD-10-CM

## 2015-10-02 DIAGNOSIS — E1065 Type 1 diabetes mellitus with hyperglycemia: Secondary | ICD-10-CM

## 2015-10-02 DIAGNOSIS — IMO0002 Reserved for concepts with insufficient information to code with codable children: Secondary | ICD-10-CM

## 2015-10-02 DIAGNOSIS — M549 Dorsalgia, unspecified: Secondary | ICD-10-CM

## 2015-10-02 DIAGNOSIS — C9110 Chronic lymphocytic leukemia of B-cell type not having achieved remission: Secondary | ICD-10-CM

## 2015-10-02 LAB — POCT GLYCOSYLATED HEMOGLOBIN (HGB A1C): Hemoglobin A1C: 6.5

## 2015-10-02 MED ORDER — ENALAPRIL MALEATE 20 MG PO TABS: 20.0000 mg | ORAL_TABLET | Freq: Two times a day (BID) | ORAL | Status: DC

## 2015-10-02 MED ORDER — FAMOTIDINE 20 MG PO TABS: 20.0000 mg | ORAL_TABLET | Freq: Every day | ORAL | Status: DC

## 2015-10-02 MED ORDER — HYDROCHLOROTHIAZIDE 25 MG PO TABS: 25.0000 mg | ORAL_TABLET | Freq: Every day | ORAL | Status: DC

## 2015-10-02 MED ORDER — METFORMIN HCL 1000 MG PO TABS: 1000.0000 mg | ORAL_TABLET | Freq: Two times a day (BID) | ORAL | Status: DC

## 2015-10-02 MED ORDER — CYCLOBENZAPRINE HCL 10 MG PO TABS: 10.0000 mg | ORAL_TABLET | Freq: Two times a day (BID) | ORAL | Status: DC

## 2015-10-02 MED ORDER — CILOSTAZOL 50 MG PO TABS: 50.0000 mg | ORAL_TABLET | Freq: Two times a day (BID) | ORAL | Status: DC

## 2015-10-02 MED ORDER — SIMVASTATIN 20 MG PO TABS: 20.0000 mg | ORAL_TABLET | Freq: Every day | ORAL | Status: DC

## 2015-10-02 MED ORDER — METOPROLOL TARTRATE 100 MG PO TABS: 100.0000 mg | ORAL_TABLET | Freq: Two times a day (BID) | ORAL | Status: DC

## 2015-10-02 MED ORDER — MONTELUKAST SODIUM 10 MG PO TABS: 10.0000 mg | ORAL_TABLET | Freq: Every day | ORAL | Status: DC

## 2015-10-02 MED ORDER — ALBUTEROL SULFATE HFA 108 (90 BASE) MCG/ACT IN AERS: 2.0000 | INHALATION_SPRAY | RESPIRATORY_TRACT | Status: DC | PRN

## 2015-10-02 NOTE — Assessment & Plan Note (Signed)
Per patient she has had back pain "for a while". Paraspinal muscle tenderness in lumbar region. Takes Baclofen and Tramadol for this, but staets Baclofen does not improve pain. Has been out of Tramadol.  - x-ray lumbar back ordered - Flexeril 10 mg BID - explained to patient that we do not prescribe controlled medications on the first office visit. Patient understood. - Tylenol PRN

## 2015-10-02 NOTE — Assessment & Plan Note (Signed)
A1c 6.5 today. Continue Metformin 1000mg  BID.

## 2015-10-02 NOTE — Progress Notes (Signed)
Patient ID: [name] [name], female   DOB: Nov 06, 1946, 69 y.o.   MRN: [id number]   Subjective:  Chief complaint: New Patient visit; "here for check up and med refill"  Patient is here to establish care.   Patient moved to [facility] in 2013 from [place]; moved back to [place] in 2015. Patient has not seen a physician for the past year. Patient did complete new patient form but states the medications listed on the form are the medications she currently has at home. She states that she may need to be on other medications but she ran out of the others; she is unaware of the names these medications. She did not bring her medications to this visit.   We reviewed her past medical history: Patient is a poor historian   HTN: Patient takes HCTZ 25mg  daily, Metoprolol Tartrate 100mg  BID, and Enalapril 20mg  BID.   DM2: Patient takes Metformin 1000mg  BID. A1c today 6.5.   Asthma: Patient states she has asthma; per chart review COPD is also on her problem list. Patient is a current smoker and states she smokes 1-2 cigarettes a day. Patient is only using Albuterol PRN and Montelukast 10mg  at bedtime.  Per chart review, medication list includes Advair (historical med) and a note in 2013 mentions Atrovent, neither of which the patient is able to recall.   Hypercholesterolemia:  Patient is taking Simvastatin  CLL: Taking Cilostazol. She does not recall seeing an oncologist for this.   Sever PAD: with multiple stents in LE and graft in left LE. Patient's old medication list on chart notes of Plavix, however patient has not been taking this "for a while" and did not list this as a medication in her new patient forms. Unsure about the reason for antiplatelet (if this is needed due stenting/graft).  Chronic Back Pain: Patient states she has had low back pain for years. However this is not on her problem list. Patient has been taking Baclofen and Tramadol, but ran out of Tramadol "a while ago". Baclofen  does not work.   Tension Type HA: Patient noes of daily headaches that are band-like around her head. This is a chronic issue. Per chart review patient has a history brain meningioma excision.  GERD: Patient uses Famotidine.    Colonoscopy: no  Pneumonia Vaccine: uncertain  Mammogram: no Menopause: yes  Vaginal Bleeding: no  Tobacco Use: yes   Alcohol Use: no  Other Drugs: no   Life at Home: lives with husband  ROS- notes of HA today  Past Medical History Patient Active Problem List   Diagnosis Date Noted  . Chronic back pain 10/02/2015  . Acute right hip pain 03/13/2012  . Constipation 11/15/2011  . Episodic tension type headache 09/24/2010  . GAIT IMBALANCE 12/10/2009  . DM (diabetes mellitus), type 2 with peripheral vascular complications ([place]) 82/50/0370  . Chronic lymphocytic leukemia ([place]) 04/11/2009  . Adjustment disorder with depressed mood 12/05/2008  . PERIPHERAL VASCULAR DISEASE 10/13/2007  . HYPERCHOLESTEROLEMIA 02/23/2007  . ANXIETY 02/23/2007  . TOBACCO DEPENDENCE 02/23/2007  . HYPERTENSION, BENIGN SYSTEMIC 02/23/2007  . COPD 02/23/2007  . GASTROESOPHAGEAL REFLUX, NO ESOPHAGITIS 02/23/2007    Medications- reviewed and updated Current Outpatient Prescriptions  Medication Sig Dispense Refill  . albuterol (PROVENTIL) (2.5 MG/3ML) 0.083% nebulizer solution Take 2.5 mg by nebulization every 4 (four) hours as needed for wheezing or shortness of breath.    . cilostazol (PLETAL) 50 MG tablet Take 1 tablet (50 mg total) by mouth 2 (two) times  daily. 30 tablet 1  . enalapril (VASOTEC) 20 MG tablet Take 1 tablet (20 mg total) by mouth 2 (two) times daily. 90 tablet 2  . famotidine (PEPCID) 20 MG tablet Take 1 tablet (20 mg total) by mouth daily. 30 tablet 0  . hydrochlorothiazide (HYDRODIURIL) 25 MG tablet Take 1 tablet (25 mg total) by mouth daily. 90 tablet 2  . metFORMIN (GLUCOPHAGE) 1000 MG tablet Take 1 tablet (1,000 mg total) by mouth 2 (two) times daily with  a meal. 60 tablet 0  . metoprolol (LOPRESSOR) 100 MG tablet Take 1 tablet (100 mg total) by mouth 2 (two) times daily with a meal. 60 tablet 0  . montelukast (SINGULAIR) 10 MG tablet Take 1 tablet (10 mg total) by mouth at bedtime. 90 tablet 4  . simvastatin (ZOCOR) 20 MG tablet Take 1 tablet (20 mg total) by mouth at bedtime. 90 tablet 4  . traMADol (ULTRAM) 50 MG tablet Take 50 mg by mouth every 6 (six) hours as needed (For pain.).    Marland Kitchen albuterol (PROVENTIL HFA;VENTOLIN HFA) 108 (90 BASE) MCG/ACT inhaler Inhale 2 puffs into the lungs every 4 (four) hours as needed for wheezing or shortness of breath. 1 Inhaler 5  . clopidogrel (PLAVIX) 75 MG tablet Take 1 tablet (75 mg total) by mouth daily. (Patient not taking: Reported on 10/02/2015) 90 tablet 4  . cyclobenzaprine (FLEXERIL) 10 MG tablet Take 1 tablet (10 mg total) by mouth 2 (two) times daily. 30 tablet 1  . docusate sodium (COLACE) 250 MG capsule Take 1 capsule (250 mg total) by mouth daily as needed for constipation. (Patient not taking: Reported on 10/02/2015) 10 capsule 0  . Fluticasone-Salmeterol (ADVAIR DISKUS) 250-50 MCG/DOSE AEPB Inhale 1 puff into the lungs 2 (two) times daily as needed (For asthma.).     No current facility-administered medications for this visit.    Objective: BP 140/63 mmHg  Pulse 51  Temp(Src) 98.3 F (36.8 C) (Oral)  Ht 5' 1.5" (1.562 m)  Wt 133 lb (60.328 kg)  BMI 24.73 kg/m2 Gen: NAD, alert, cooperative with exam HEENT: NCAT, EOMI, PERRL CV: RRR, good S1/S2, no murmur Resp: CTABL, no wheezes, non-labored Abd: Soft,some tenderness to palpation of epigastric region, LUQ and RUQ, Non Distended, BS present, no rebound or guarding or organomegaly Spine: tenderness of lumbar paraspinal muscles Ext: No edema, warm Neuro: Alert and oriented, No gross deficits; 5/5 strength in all extremities; normal sensation to light touch in all extremities; CN 3-11 normal.   Assessment/Plan: [name] [name] is a 69 yo female  with a complicated past medical history. She is a poor historian and is uncertain of which medications she needs to be taking. She signed a release of information so that we will be able to obtain records from her previous PCP in [place] (Dr. Juan Quam, MD). Hopefully these records will clarify her PMH and her medications. Due to her complex/uncertain medical history along with her being new to the practice, close follow up will be essential. She will have close follow up in 2 weeks.    Health Maintenance: Referral to GI for screening colonoscopy   HYPERTENSION, BENIGN SYSTEMIC BP stable at clinic visit. Will refill medications she is currently taking for HTN - HCTZ 25mg  daily - Metoprolol 100mg  BID - Enalapril 20mg  BID  PERIPHERAL VASCULAR DISEASE Per chart review s/p multiple stents in LE and  graft in left LE. Per chart review in EPIC, Plavix was on her medication list. She is currently not  taking this and she did not list this medication on the new patient form. Will wait for records from prior PCP at [facility] to gain some clarity regarding this.    COPD Patient was not aware she was diagnosed with COPD. She does state she has Asthma. Patient is only currently using Albuterol PRN and taking Singulair. She denies seeing pulmonologist. Will wait for records from prior PCP in [place] before starting maintenance medication.    GASTROESOPHAGEAL REFLUX, NO ESOPHAGITIS - refilled home Famotidine  DM (diabetes mellitus), type 2 with peripheral vascular complications Z9D 6.5 today. Continue Metformin 1000mg  BID.   Episodic tension type headache Notes of HA today. She has been having daily headaches. She does have a history of meningioma removal in the past. States taking Tylenol PRN for HA. Unable to fully discuss this issue due to her extensive medical history.  - continue Tylenol  - close follow up in 2 weeks to discuss further  Chronic lymphocytic leukemia  ([place]) Patient takes Cilostazol 50mg  BID. Patient denies seeing an oncologist.  - refilled med - Will wait for records from prior PCP in [facility] for further clarification   HYPERCHOLESTEROLEMIA No lipid panel on file in years. Ordered fasting lipid panel during clinic visit in 2 weeks - continue Simvastatin  Chronic back pain Per patient she has had back pain "for a while". Paraspinal muscle tenderness in lumbar region. Takes Baclofen and Tramadol for this, but staets Baclofen does not improve pain. Has been out of Tramadol. No imaging on file. - x-ray lumbar back ordered - Flexeril 10 mg BID - explained to patient that we do not prescribe controlled medications on the first office visit. Patient understood. - Tylenol PRN   Orders Placed This Encounter  Procedures  . DG Lumbar Spine Complete    Standing Status: Future     Number of Occurrences:      Standing Expiration Date: 12/01/2016    Order Specific Question:  Reason for Exam (SYMPTOM  OR DIAGNOSIS REQUIRED)    Answer:  back pain    Order Specific Question:  Preferred imaging location?    Answer:  [facility]  . Lipid panel    Fasting lipid    Standing Status: Future     Number of Occurrences:      Standing Expiration Date: 10/01/2016  . Ambulatory referral to Gastroenterology    Referral Priority:  Routine    Referral Type:  Consultation    Referral Reason:  Specialty Services Required    Number of Visits Requested:  1  . POCT glycosylated hemoglobin (Hb A1C)    Meds ordered this encounter  Medications  . albuterol (PROVENTIL HFA;VENTOLIN HFA) 108 (90 BASE) MCG/ACT inhaler    Sig: Inhale 2 puffs into the lungs every 4 (four) hours as needed for wheezing or shortness of breath.    Dispense:  1 Inhaler    Refill:  5  . cilostazol (PLETAL) 50 MG tablet    Sig: Take 1 tablet (50 mg total) by mouth 2 (two) times daily.    Dispense:  30 tablet    Refill:  1  . cyclobenzaprine (FLEXERIL) 10 MG tablet     Sig: Take 1 tablet (10 mg total) by mouth 2 (two) times daily.    Dispense:  30 tablet    Refill:  1  . enalapril (VASOTEC) 20 MG tablet    Sig: Take 1 tablet (20 mg total) by mouth 2 (two) times daily.  Dispense:  90 tablet    Refill:  2  . famotidine (PEPCID) 20 MG tablet    Sig: Take 1 tablet (20 mg total) by mouth daily.    Dispense:  30 tablet    Refill:  0  . hydrochlorothiazide (HYDRODIURIL) 25 MG tablet    Sig: Take 1 tablet (25 mg total) by mouth daily.    Dispense:  90 tablet    Refill:  2  . metFORMIN (GLUCOPHAGE) 1000 MG tablet    Sig: Take 1 tablet (1,000 mg total) by mouth 2 (two) times daily with a meal.    Dispense:  60 tablet    Refill:  0  . metoprolol (LOPRESSOR) 100 MG tablet    Sig: Take 1 tablet (100 mg total) by mouth 2 (two) times daily with a meal.    Dispense:  60 tablet    Refill:  0  . montelukast (SINGULAIR) 10 MG tablet    Sig: Take 1 tablet (10 mg total) by mouth at bedtime.    Dispense:  90 tablet    Refill:  4  . simvastatin (ZOCOR) 20 MG tablet    Sig: Take 1 tablet (20 mg total) by mouth at bedtime.    Dispense:  90 tablet    Refill:  4

## 2015-10-02 NOTE — Assessment & Plan Note (Signed)
No lipid panel on file in years. Ordered fasting lipid panel during clinic visit in 2 weeks - continue Simvastatin

## 2015-10-02 NOTE — Assessment & Plan Note (Signed)
Patient was not aware she was diagnosed with COPD. She does state she has Asthma. Patient is only currently using Albuterol PRN and taking Singulair. She denies seeing pulmonologist. Will wait for records from prior PCP in [place] before starting maintenance medication.

## 2015-10-02 NOTE — Assessment & Plan Note (Signed)
-   refilled home Famotidine

## 2015-10-02 NOTE — Patient Instructions (Signed)
Thank you for coming in  I refilled your blood pressure medications.  Continue to take Metformin for your Diabetes I made a referral to the stomach doctors for your colonoscopy. We will call you with the appointment date.  I refilled your medicine for heart burn  I prescribed you Flexeril for your back pain. Stop taking Baclofen.  You can continue to take Tylenol as needed for back pain and headaches  Also for your back pain: I ordered a xray of your low spine. Please go the the [facility] to get this done  You signed a release of information for your doctor in [place]. I will review this information as soon as he sends it.   I want to see you back in 2 weeks; you also have to come in fasting to get your cholesterol checked that same day.

## 2015-10-02 NOTE — Assessment & Plan Note (Signed)
Per chart review s/p multiple stents in LE and  graft in left LE. Per chart review in EPIC, Plavix was on her medication list. She is currently not taking this and she did not list this medication on the new patient form. Will wait for records from prior PCP at [facility] to gain some clarity regarding this.

## 2015-10-02 NOTE — Assessment & Plan Note (Signed)
BP stable at clinic visit. Will refill medications she is currently taking for HTN - HCTZ 25mg  daily - Metoprolol 100mg  BID - Enalapril 20mg  BID

## 2015-10-02 NOTE — Assessment & Plan Note (Signed)
Patient takes Cilostazol 50mg  BID. Patient denies seeing an oncologist.  - refilled med - Will wait for records from prior PCP in [facility] for further clarification

## 2015-10-02 NOTE — Assessment & Plan Note (Signed)
Notes of HA today. She has been having daily headaches. She does have a history of meningioma removal in the past. States taking Tylenol PRN for HA. Unable to fully discuss this issue due to her extensive medical history.  - continue Tylenol  - close follow up in 2 weeks to discuss further

## 2015-10-15 ENCOUNTER — Ambulatory Visit: Payer: PPO | Admitting: Internal Medicine

## 2015-10-24 ENCOUNTER — Ambulatory Visit (INDEPENDENT_AMBULATORY_CARE_PROVIDER_SITE_OTHER): Payer: PPO | Admitting: Internal Medicine

## 2015-10-24 ENCOUNTER — Encounter: Payer: Self-pay | Admitting: Internal Medicine

## 2015-10-24 VITALS — BP 130/64 | HR 57 | Temp 98.0°F | Wt 138.0 lb

## 2015-10-24 DIAGNOSIS — I739 Peripheral vascular disease, unspecified: Secondary | ICD-10-CM | POA: Diagnosis not present

## 2015-10-24 DIAGNOSIS — IMO0002 Reserved for concepts with insufficient information to code with codable children: Secondary | ICD-10-CM

## 2015-10-24 DIAGNOSIS — E108 Type 1 diabetes mellitus with unspecified complications: Secondary | ICD-10-CM | POA: Diagnosis not present

## 2015-10-24 DIAGNOSIS — H547 Unspecified visual loss: Secondary | ICD-10-CM

## 2015-10-24 DIAGNOSIS — Z23 Encounter for immunization: Secondary | ICD-10-CM | POA: Diagnosis not present

## 2015-10-24 DIAGNOSIS — G8929 Other chronic pain: Secondary | ICD-10-CM

## 2015-10-24 DIAGNOSIS — E1065 Type 1 diabetes mellitus with hyperglycemia: Secondary | ICD-10-CM

## 2015-10-24 DIAGNOSIS — G44219 Episodic tension-type headache, not intractable: Secondary | ICD-10-CM

## 2015-10-24 DIAGNOSIS — C9110 Chronic lymphocytic leukemia of B-cell type not having achieved remission: Secondary | ICD-10-CM

## 2015-10-24 DIAGNOSIS — M549 Dorsalgia, unspecified: Secondary | ICD-10-CM

## 2015-10-24 LAB — LIPID PANEL
Cholesterol: 87 mg/dL — ABNORMAL LOW (ref 125–200)
HDL: 31 mg/dL — ABNORMAL LOW (ref >=46)
LDL Cholesterol: 30 mg/dL (ref <130)
Total CHOL/HDL Ratio: 2.8 Ratio (ref <=5.0)
Triglycerides: 128 mg/dL (ref <150)
VLDL: 26 mg/dL (ref <30)

## 2015-10-24 MED ORDER — CLOPIDOGREL BISULFATE 75 MG PO TABS: 75.0000 mg | ORAL_TABLET | Freq: Every day | ORAL | Status: DC

## 2015-10-24 NOTE — Assessment & Plan Note (Signed)
Notes of claudication with activity (walking 0.5 block).  DP pulse intact in left foot but unable to palpate in right foot. Lower extremities warm to touch and no skin breakdown noted.  - restart Plavix 75mg  daily  - continue Cilostazol

## 2015-10-24 NOTE — Assessment & Plan Note (Signed)
Patient did not get imaging done - lumbar spine x-ray

## 2015-10-24 NOTE — Assessment & Plan Note (Signed)
Patient is a poor historian. Unsure if she has been seen by heme/onc or if she received treatment.  - will get CBC with diff - consider referral to heme/onc if CBC is abnormal - waiting for records from previous PCP in [place]

## 2015-10-24 NOTE — Patient Instructions (Signed)
Thank you for coming in - Pick up Plavix from the pharmacy - Please take Flexeril (muscle relaxer) only when you have bad back pain (not every day if you do not need it) - get lab work done before leaving the clinic today - PLEASE BRING YOUR MEDICATIONS TO EVERY DOCTOR VISIT. ([place]) - Please follow up in about 1 month

## 2015-10-24 NOTE — Assessment & Plan Note (Signed)
Symptoms seem to be consistent with tension headaches. Symptoms are brought on by stress and is relieved by Tylenol. Does have a history of meningioma s/p resection and does have CLL. Notes that she initially started having headaches and then she was diagnosed with brain tumor (meningioma) which was resected in 1998. After surgery, her headaches resolved until around 2012.  - will continue to monitor - continue with Tylenol for now - if symptoms worsen, consider imaging of head

## 2015-10-24 NOTE — Progress Notes (Signed)
Patient ID: LENNY BOUCHILLON, female   DOB: [birth year], 69 y.o.   MRN: [id number] Subjective:   CC: 2 week follow up  HPI:  ** Patient did not bring her medications to visit  Chronic Back Pain:  - notes of low back pain with intermittent radiation to LE for the past 3 years. Symptoms have been stable - notes of intermittent numbness in her thighs which patient then describes as muscle spasm. States muscle relaxer helps resolve this symptom - denies urinary retention or incontinence or fecal incontinence  - no falls  - pain worsens when patients lays down; some times leaning forward improves pain  - lumbar spine x-ray was ordered at her previous clinic visit but patient has not gotten this imaging done as she had transportation issues   PVD: - notes of bilateral leg cramping when walking about half a block  - notes of stents placed in LE and graft in left LE in 2015 in [place]  - Is taking Cilostazol but ran out of Plavix a long time ago - continues to smoke cigarettes    HA: - initially describes headache as pounding then describes as a tightening that starts in occipital region and spreads around the head like a band associated with sensitivity to noise. Symptoms last from 10 mins to about 1 hr. No associated nausea or vomiting. Occurs about every other day. States stress tends start HA.  - notes that she initially started having headaches and then she was diagnosed with brain tumor (meningioma) which was resected in 1998. After surgery, her headaches resolved until around 2012.  - states Tylenol 800mg  helps ease off pain  - No nighttime awakenings due to pain.    CLL: - initially was not sure of this diagnosis; states her daughter filled out new patient form which mentioned this diagnosis - unsure if patient was seen by heme/onc  - unsure if patient received treatment  Review of Systems - Per HPI.   PMH and medications reviewed Smoking status: current smoker    Objective:   Physical Exam BP 130/64 mmHg  Pulse 57  Temp(Src) 98 F (36.7 C) (Oral)  Wt 138 lb (62.596 kg)  SpO2 96% GEN: NAD HEENT: Atraumatic, normocephalic, neck supple, EOMI, sclera clear  CV: RRR, no murmurs, rubs, or gallops; DP pulse intact in left foot but unable to palpate in right foot.  PULM: CTAB, normal effort ABD: Soft, nontender, nondistended, NABS, no organomegaly SKIN: No rash or cyanosis; warm and well-perfused MSK: spinal tenderness at level of ~L3; no paraspinal tenderness; patient unable to lay on exam table as she has significant back pain therefore limited exam; able to flex at hip although with some pain; able to laterally bend. Gait: does have limp due to back pain EXTR: No lower extremity edema or calf tenderness; Lower extremities are warm and good capillary refill. No skin breakdown noted.  PSYCH: Mood and affect euthymic, normal rate and volume of speech NEURO: Awake, alert, no CN deficits (visual field not tested), normal speech; Decreased sensation to light touch on right arm and leg (patient notes this is due to previous stroke). Strength 5/5 in upper extremities; 5/5 in lower extremities but with some pain in low back   Assessment:     CORLENE SABIA is a 69 y.o. female here for 2 week follow up     Plan:     # See problem list and after visit summary for problem-specific plans.   Follow-up: Follow up  in 1 month    Smiley Houseman, MD [place]

## 2015-10-25 LAB — CBC WITH DIFFERENTIAL/PLATELET
Basophils Absolute: 0 10*3/uL (ref 0.0–0.1)
Basophils Relative: 0 % (ref 0–1)
Eosinophils Absolute: 0.2 10*3/uL (ref 0.0–0.7)
Eosinophils Relative: 1 % (ref 0–5)
HCT: 36.6 % (ref 36.0–46.0)
Hemoglobin: 12.2 g/dL (ref 12.0–15.0)
Lymphocytes Relative: 71 % — ABNORMAL HIGH (ref 12–46)
Lymphs Abs: 14.3 10*3/uL — ABNORMAL HIGH (ref 0.7–4.0)
MCH: 29 pg (ref 26.0–34.0)
MCHC: 33.3 g/dL (ref 30.0–36.0)
MCV: 86.9 fL (ref 78.0–100.0)
MPV: 10.2 fL (ref 8.6–12.4)
Monocytes Absolute: 1.4 10*3/uL — ABNORMAL HIGH (ref 0.1–1.0)
Monocytes Relative: 7 % (ref 3–12)
Neutro Abs: 4.2 10*3/uL (ref 1.7–7.7)
Neutrophils Relative %: 21 % — ABNORMAL LOW (ref 43–77)
Platelets: 224 10*3/uL (ref 150–400)
RBC: 4.21 MIL/uL (ref 3.87–5.11)
RDW: 15 % (ref 11.5–15.5)
WBC: 20.2 10*3/uL — ABNORMAL HIGH (ref 4.0–10.5)

## 2015-10-27 LAB — PATHOLOGIST SMEAR REVIEW

## 2015-10-28 ENCOUNTER — Telehealth: Payer: Self-pay | Admitting: Internal Medicine

## 2015-10-28 DIAGNOSIS — C9110 Chronic lymphocytic leukemia of B-cell type not having achieved remission: Secondary | ICD-10-CM

## 2015-10-28 NOTE — Telephone Encounter (Signed)
Called patient to inform her of lab results and the plan to refer to heme/onc for her CLL. Patient understands.

## 2015-10-29 ENCOUNTER — Other Ambulatory Visit: Payer: Self-pay | Admitting: Internal Medicine

## 2015-10-30 ENCOUNTER — Telehealth: Payer: Self-pay | Admitting: Oncology

## 2015-10-30 NOTE — Telephone Encounter (Signed)
Called pt's home number phone constant ring. (regarding new pt appt)

## 2015-11-04 ENCOUNTER — Ambulatory Visit: Payer: PPO | Admitting: Oncology

## 2015-11-06 ENCOUNTER — Ambulatory Visit (HOSPITAL_COMMUNITY)
Admission: RE | Admit: 2015-11-06 | Discharge: 2015-11-06 | Disposition: A | Discharge status: Home / Self Care | Payer: PPO | Source: Ambulatory Visit | Attending: Family Medicine | Admitting: Family Medicine

## 2015-11-06 DIAGNOSIS — G8929 Other chronic pain: Secondary | ICD-10-CM | POA: Diagnosis not present

## 2015-11-06 DIAGNOSIS — E1065 Type 1 diabetes mellitus with hyperglycemia: Secondary | ICD-10-CM

## 2015-11-06 DIAGNOSIS — M545 Low back pain: Secondary | ICD-10-CM | POA: Diagnosis present

## 2015-11-06 DIAGNOSIS — IMO0002 Reserved for concepts with insufficient information to code with codable children: Secondary | ICD-10-CM

## 2015-11-06 DIAGNOSIS — M4186 Other forms of scoliosis, lumbar region: Secondary | ICD-10-CM | POA: Diagnosis not present

## 2015-11-06 DIAGNOSIS — E108 Type 1 diabetes mellitus with unspecified complications: Secondary | ICD-10-CM

## 2015-11-12 ENCOUNTER — Telehealth: Payer: Self-pay | Admitting: Hematology

## 2015-11-12 NOTE — Telephone Encounter (Signed)
New patient appt-s/w patient and gave np appt for 11/22 @ 12:45 w/Dr. Irene Limbo Referring Dr. Dallas Schimke Dx- CLL

## 2015-11-18 ENCOUNTER — Ambulatory Visit: Payer: PPO | Admitting: Hematology

## 2015-11-18 ENCOUNTER — Telehealth: Payer: Self-pay | Admitting: Hematology

## 2015-11-18 NOTE — Telephone Encounter (Signed)
new patient appt-s/w patient and gave new date/time for appt 12/08 @ 11 w/Dr. Irene Limbo

## 2015-11-18 NOTE — Telephone Encounter (Signed)
patient called to r/s appt to 11/29 @ 2 w/Dr. Lindi Adie

## 2015-11-25 ENCOUNTER — Ambulatory Visit: Payer: PPO | Admitting: Hematology

## 2015-11-26 ENCOUNTER — Ambulatory Visit: Payer: PPO | Admitting: Internal Medicine

## 2015-12-04 ENCOUNTER — Ambulatory Visit: Payer: PPO | Admitting: Hematology

## 2015-12-09 ENCOUNTER — Encounter: Payer: Self-pay | Admitting: Internal Medicine

## 2015-12-09 ENCOUNTER — Ambulatory Visit (INDEPENDENT_AMBULATORY_CARE_PROVIDER_SITE_OTHER): Payer: PPO | Admitting: Internal Medicine

## 2015-12-09 VITALS — BP 120/68 | HR 64 | Temp 98.4°F | Ht 61.5 in | Wt 132.0 lb

## 2015-12-09 DIAGNOSIS — I739 Peripheral vascular disease, unspecified: Secondary | ICD-10-CM | POA: Diagnosis not present

## 2015-12-09 DIAGNOSIS — I70209 Unspecified atherosclerosis of native arteries of extremities, unspecified extremity: Secondary | ICD-10-CM

## 2015-12-09 DIAGNOSIS — M549 Dorsalgia, unspecified: Secondary | ICD-10-CM | POA: Diagnosis not present

## 2015-12-09 DIAGNOSIS — G8929 Other chronic pain: Secondary | ICD-10-CM

## 2015-12-09 NOTE — Patient Instructions (Addendum)
Please make an appointment with Dr Valentina Lucks in pharmacy clinic to get ABIs done.  Please try to make an appointment for your mammogram Please call your former primary care doctor about sending [place] your records  Please go to [facility] to get MRI of your lower spine (at your earliest convenience)   Please make an appointment for Diabetes in February

## 2015-12-09 NOTE — Assessment & Plan Note (Signed)
Physical exam limited due to pain. X-ray of lumbar spine only showed mild degenerative changes and spina bifida. Considered tethered cord syndrome. Considered radiculopathy. - MRI lumbar spine to further evaluate

## 2015-12-09 NOTE — Assessment & Plan Note (Signed)
Right DP pulse weaker than left. Also right foot slightly cooler than left  - continue Cilostazol and Plavix - Patient to make appointment at pharmacy clinic for ABIs  - will consider vascular surgery referral after ABI results

## 2015-12-09 NOTE — Progress Notes (Signed)
Patient ID: LATESSA PEROVICH, female   DOB: 15-Jan-1946, 69 y.o.   MRN: [id number] Date of Visit: 12/09/2015   HPI: Patient is here for follow up. Patient has complicated medical history and re-established care in clinic in October and has had close follow up with discuss complex medical history.   PVD: - notes she is unable to walk long distances; unable to walk for more than 5 minutes without getting cramps in legs bilaterally. Notes symptoms have become worse since 10/2014. Continues to take Cilostazol and Plavix as prescribed  Gait Problems/Chronic Back Pain:  - notes of "staggared walking" and chronic low back pain for about a month - is taking Flexeril which helps a "little bit"; but states tramadol works the best - had x-ray done recently which only showed mild diffuse degenerative changes, mild scoliosis (r), spina bifida S1  CLL:  Of note, patient has not been able to see oncologist due to cost of visit. States she will reschedule soon.    ROS: See HPI.  PHYSICAL EXAM: BP 120/68 mmHg  Pulse 64  Temp(Src) 98.4 F (36.9 C) (Oral)  Ht 5' 1.5" (1.562 m)  Wt 132 lb (59.875 kg)  BMI 24.54 kg/m2 Gen: NAD Heart: RRR, no m/r/g Lungs: CTAB, normal effort MSK: Gait: notes of pain with walking, limp with walking  Spine: spinal tenderness to palpation at ~L5-S1. Some right paraspinal tenderness. Rotation and extension of trunk causes pain in lower back. Hip Flexor strength: right hip flexor unable to assess due to pain as patient has significant pain of lower back and right posterior thigh with slight hip flexion. Left hip flexor 5/5. Passive ROM of hip flexion limited by pain on R side. Plantar flexion 5/5 bilaterally; Patellar reflex: normal on right, unable to provoke on left. Straight leg raise: pain with slight passive lift of right leg.  Extremities: Vascular: DP pulse 2+ on left foot, DP pulse +1/threddy on right foot, right foot slightly cool compared to left.     ASSESSMENT/PLAN:  Health maintenance:  - info given to patient for mammogram  - awaiting records from prior PCP regarding other health maintenance (asked patient to call office to request; signed request already on file)  PERIPHERAL VASCULAR DISEASE Right DP pulse weaker than left. Also right foot slightly cooler than left  - continue Cilostazol and Plavix - Patient to make appointment at pharmacy clinic for ABIs  - will consider vascular surgery referral after ABI results  Chronic back pain Physical exam limited due to pain. X-ray of lumbar spine only showed mild degenerative changes and spina bifida. Considered tethered cord syndrome. Considered radiculopathy. - MRI lumbar spine to further evaluate    FOLLOW UP: Follow up after results of MRI lumbar spine. Follow up in February for DM.   Smiley Houseman, MD [place]

## 2015-12-24 ENCOUNTER — Other Ambulatory Visit: Payer: Self-pay | Admitting: Internal Medicine

## 2015-12-24 ENCOUNTER — Ambulatory Visit
Admission: RE | Admit: 2015-12-24 | Discharge: 2015-12-24 | Disposition: A | Discharge status: Home / Self Care | Payer: PPO | Source: Ambulatory Visit | Attending: Family Medicine | Admitting: Family Medicine

## 2015-12-24 DIAGNOSIS — M549 Dorsalgia, unspecified: Principal | ICD-10-CM

## 2015-12-24 DIAGNOSIS — G8929 Other chronic pain: Secondary | ICD-10-CM

## 2016-01-01 ENCOUNTER — Encounter: Payer: Self-pay | Admitting: Pharmacist

## 2016-01-01 ENCOUNTER — Ambulatory Visit (INDEPENDENT_AMBULATORY_CARE_PROVIDER_SITE_OTHER): Payer: PPO | Admitting: Pharmacist

## 2016-01-01 VITALS — BP 133/67 | HR 57 | Ht 62.0 in | Wt 126.1 lb

## 2016-01-01 DIAGNOSIS — F17200 Nicotine dependence, unspecified, uncomplicated: Secondary | ICD-10-CM | POA: Diagnosis not present

## 2016-01-01 DIAGNOSIS — E1151 Type 2 diabetes mellitus with diabetic peripheral angiopathy without gangrene: Secondary | ICD-10-CM

## 2016-01-01 DIAGNOSIS — I739 Peripheral vascular disease, unspecified: Secondary | ICD-10-CM

## 2016-01-01 DIAGNOSIS — I10 Essential (primary) hypertension: Secondary | ICD-10-CM

## 2016-01-01 MED ORDER — METOPROLOL TARTRATE 100 MG PO TABS: 100.0000 mg | ORAL_TABLET | Freq: Two times a day (BID) | ORAL | Status: DC

## 2016-01-01 MED ORDER — METFORMIN HCL 1000 MG PO TABS: 1000.0000 mg | ORAL_TABLET | Freq: Two times a day (BID) | ORAL | Status: DC

## 2016-01-01 NOTE — Assessment & Plan Note (Signed)
Moderate PAD based on ABI of 0.56 in a patient with symptoms of stabbing pain when walking.  Chronic nicotine abuse, currently low level intake.  Reports smoking two cigarettes per day, reduced from 8 cigarettes per day.  Discussed importance of smoking cessation for PAD.  Patient set quit date for 01/07/16.  Reports having nicotine patches at home.  Encouraged patient to use 7mg  patches due to low nicotine intake.

## 2016-01-01 NOTE — Patient Instructions (Signed)
Thank you for coming in today!   Your test shows moderate obstruction worse in your right leg.   Tobacco quit date set for next Wednesday, 01/07/16.  Use nicotine 7mg  patches.    Follow up with Dr. Dallas Schimke in 2 to 3 weeks.

## 2016-01-01 NOTE — Progress Notes (Signed)
Patient ID: [name] [name], female   DOB: [birth year], 70 y.o.   MRN: [id number] Reviewed: Agree with Dr. Graylin Shiver documentation and management.

## 2016-01-01 NOTE — Assessment & Plan Note (Signed)
Chronic nicotine abuse, currently low level intake.  Reports smoking two cigarettes per day, reduced from 8 cigarettes per day.  Discussed importance of smoking cessation for PAD.  Patient set quit date for 01/07/16.  Reports having nicotine patches at home.  Encouraged patient to use 7mg  patches due to low nicotine intake.

## 2016-01-01 NOTE — Progress Notes (Signed)
S:    Patient arrives in good spirits, ambulating without assistance.  She presents with her husband, [name].  She presents to the clinic for PADABI evaluation.   Reports pain with walking and worse with walking uphill.  Pain is described as stabbing which occurs after a few minutes of exercise.  denies pain starting while at rest or standing still.  Reports pain worsens when walking up hill or in a hurry. reports pain when walking at an ordinary pace on a level surface.  Reports pain resolves on sitting after 10 minutes.  Pain is localized to both legs bilaterally but worse with the right leg.  O:  Lower extremity Physical Exam includes diminished pulses on right leg and thickened brittle nails and multiple small wounds bilaterally likely poor healing of past trauma.  ABI overall = 0.56. Right Arm 160 mmHg    Left Arm 160 mmHg Right ankle posterior tibial 92 mmHg     dorsalis pedis 90 mmHg Left ankle posterior tibial 118 mmHg    dorsalis pedis 124 mmHg   A/P: Moderate PAD based on ABI of 0.56 in a patient with symptoms of stabbing pain when walking.  Chronic nicotine abuse, currently low level intake.  Reports smoking two cigarettes per day, reduced from 8 cigarettes per day.  Discussed importance of smoking cessation for PAD.  Patient set quit date for 01/07/16.  Reports having nicotine patches at home.  Encouraged patient to use 7mg  patches due to low nicotine intake.     Results reviewed and written information provided.   F/U Clinic Visit with Dr. Dallas Schimke.  Total time in face-to-face counseling 45 minutes.  Patient seen with Phillis Knack, PharmD Candidate, Bennye Alm, PharmD Resident, and Elisabeth Most, PharmD Resident.

## 2016-01-08 ENCOUNTER — Ambulatory Visit: Payer: PPO | Admitting: Pharmacist

## 2016-01-16 ENCOUNTER — Telehealth: Payer: Self-pay | Admitting: Internal Medicine

## 2016-01-16 ENCOUNTER — Ambulatory Visit: Payer: PPO | Admitting: Internal Medicine

## 2016-01-16 DIAGNOSIS — I739 Peripheral vascular disease, unspecified: Secondary | ICD-10-CM

## 2016-01-16 DIAGNOSIS — M549 Dorsalgia, unspecified: Secondary | ICD-10-CM

## 2016-01-16 DIAGNOSIS — G8929 Other chronic pain: Secondary | ICD-10-CM

## 2016-01-16 NOTE — Telephone Encounter (Addendum)
Called patient to discuss imaging and ABI results. After discussing with attending, we will try conservative management of patient's back pain first after seeing the results of lumbar spine CT (tramadol and referral to physical therapy). If no improvement in 2-4 weeks will refer to orthopedics. Will also make referral to vascular surgery for sever PVD. Patient initially had an appointment today but cancelled as she was not able to find transportation and unable to afford copay.   Phoned in Tramadol 50mg  q 6prn for back pain #30 no refills

## 2016-01-18 MED ORDER — TRAMADOL HCL 50 MG PO TABS: 50.0000 mg | ORAL_TABLET | Freq: Four times a day (QID) | ORAL | Status: DC | PRN

## 2016-01-29 ENCOUNTER — Other Ambulatory Visit: Payer: Self-pay | Admitting: Family Medicine

## 2016-02-06 ENCOUNTER — Ambulatory Visit (HOSPITAL_COMMUNITY)
Admission: RE | Admit: 2016-02-06 | Discharge: 2016-02-06 | Disposition: A | Discharge status: Home / Self Care | Payer: Medicare HMO | Source: Ambulatory Visit | Attending: Family Medicine | Admitting: Family Medicine

## 2016-02-06 ENCOUNTER — Encounter: Payer: Self-pay | Admitting: Internal Medicine

## 2016-02-06 ENCOUNTER — Ambulatory Visit (INDEPENDENT_AMBULATORY_CARE_PROVIDER_SITE_OTHER): Payer: Medicare HMO | Admitting: Internal Medicine

## 2016-02-06 VITALS — BP 125/76 | HR 71 | Temp 98.1°F | Ht 62.0 in | Wt 126.0 lb

## 2016-02-06 DIAGNOSIS — G44219 Episodic tension-type headache, not intractable: Secondary | ICD-10-CM

## 2016-02-06 DIAGNOSIS — R9431 Abnormal electrocardiogram [ECG] [EKG]: Secondary | ICD-10-CM | POA: Diagnosis not present

## 2016-02-06 DIAGNOSIS — R51 Headache: Secondary | ICD-10-CM | POA: Diagnosis not present

## 2016-02-06 DIAGNOSIS — R519 Headache, unspecified: Secondary | ICD-10-CM

## 2016-02-06 DIAGNOSIS — I10 Essential (primary) hypertension: Secondary | ICD-10-CM | POA: Diagnosis not present

## 2016-02-06 DIAGNOSIS — E1151 Type 2 diabetes mellitus with diabetic peripheral angiopathy without gangrene: Secondary | ICD-10-CM

## 2016-02-06 DIAGNOSIS — R079 Chest pain, unspecified: Secondary | ICD-10-CM | POA: Insufficient documentation

## 2016-02-06 DIAGNOSIS — G8929 Other chronic pain: Secondary | ICD-10-CM

## 2016-02-06 DIAGNOSIS — R001 Bradycardia, unspecified: Secondary | ICD-10-CM | POA: Diagnosis not present

## 2016-02-06 DIAGNOSIS — M549 Dorsalgia, unspecified: Secondary | ICD-10-CM

## 2016-02-06 DIAGNOSIS — Z86018 Personal history of other benign neoplasm: Secondary | ICD-10-CM

## 2016-02-06 LAB — POCT GLYCOSYLATED HEMOGLOBIN (HGB A1C): Hemoglobin A1C: 6.3

## 2016-02-06 MED ORDER — TRAMADOL HCL 50 MG PO TABS: 50.0000 mg | ORAL_TABLET | Freq: Four times a day (QID) | ORAL | Status: DC | PRN

## 2016-02-06 NOTE — Patient Instructions (Signed)
Thank you for coming in.  - your EKG is unchanged from your previous one - We made a appointment with a heart doctor to further evaluate your chest pain. This appointment is in March because you mentioned that you will not have money until Wednesday, but I would like you to try and call them when you get money to see if you can be seen earlier - I ordered imaging of your brain. I gave you a card with the appointment date and time

## 2016-02-06 NOTE — Assessment & Plan Note (Signed)
Notes of HA for the past two days that is not resolved with Tylenol. Neuro exam is normal although I was unable to visualize fundus of either eye. No red flags such as thunderclap or worst headache of her life or worst headache. With history of meningioma and CLL, recurrent headache is more concerning.  - Tramadol for pain #20 - MRI brain ordered; appointment date and time given to patient.

## 2016-02-06 NOTE — Progress Notes (Signed)
Patient ID: [name] [name], female   DOB: 11/21/1946, 70 y.o.   MRN: [id number] Date of Visit: 02/06/2016   HPI: Patient is here to discuss DM. Patient would also like to discuss HA. Near the end of the visit patient also noted of some chest pain she has been having recently.   DM:  - Patient denies polydipsia, polyuria, extremity tingling/numbness - Patient reports she understands that she needs to see an eye doctor, but currently does not have the finances to do so - Has been taking Metformin as prescribed.  - Infrequently checks CBGs (twice a month): usually is around 135-137 (fasting/nonfasting)  HA: - has chronic headaches that she has had "for years".  - she is unable to characterize it but states it is located on the left side around the same area her meningioma was removed; this is where she usually gets her headaches - denies sensitivity to light, N/V, tearing, rhinorrhea associated - states the current headache she has is constant for the past two days. Her headaches usually do not last this long. Her current headache is not the worst headache she has had and it started gradually - She has tried Tylenol which has not helped   Chest Pain: - no current chest pain - notes of central chest pain which she characterizes as a heaviness  - first episode was 2 weeks ago while she was sitting in church. She had associated SOB and diaphoresis but no N/V. This episode lasted about 30 minutes and then resolved. She did not take anything for it - Shehad two more episodes on Monday and Tuesday  (2/6-2/7) which also lasted 30 mins. She was eating during this episode. She did not have sob or diaphoresis, but had emesis.  - did not worsen with movement  - has had chest pain prior to this in the past. Patient reports she had a cardiologist who completed some from of stress test but she does not remember the results. Per chart review, patient had stress test in 2011 which showed to evidence of  ischemia, normal LV function with ef 67%.  - states symptoms are different from heartburn   Patient stated in general that she may not be able to afford her medications but she currently has some financial stress.   ROS: See HPI.  [place]:  Smoking Severe PVD s/p stents/grafts on Plavix  PHYSICAL EXAM: BP 125/76 mmHg  Pulse 71  Temp(Src) 98.1 F (36.7 C) (Oral)  Ht 5\' 2"  (1.575 m)  Wt 126 lb (57.153 kg)  BMI 23.04 kg/m2 Gen: NAD HEENT: unable to fully visualize fundus of each eye. PERRL  Heart: 2/6 systolic murmur heard best left upper sternal border Chest: some tenderness to palpation of sternum (left border) but patient reports this pain is different from the chest pain earlier this week. Lungs: CTAB Neuro: CN 2-12 intact, strength 5/5 in upper and lower extremities, sensation to lite touch normal in upper and lower extremities Foot exam in chart.  ASSESSMENT/PLAN: DM (diabetes mellitus), type 2 with peripheral vascular complications Controlled. A1c is 6.3 today. However, patient has no sensation to monofilament on foot exam.  - continue current regimen  - discussed the importance of evaluating feet daily   Episodic tension type headache Notes of HA for the past two days that is not resolved with Tylenol. Neuro exam is normal although I was unable to visualize fundus of either eye. No red flags such as thunderclap or worst headache of her life or worst  headache. With history of meningioma and CLL, recurrent headache is more concerning.  - Tramadol for pain #20 - MRI brain ordered; appointment date and time given to patient.   Chest pain Is high risk for cardiac cause with history of severe PVD, smoking, HTN, HLD. EKG in clinic showed no significant change from prior most recent EKG.  - given precautions to go to ED - Cardiology referral made and appointment date and time given to patient. Patient was not sure if she could afford the visit if it was earlier than March.  Appointment on March 3rd. If patient is able to afford,i asked her to call the clinic to determine if she can been seen earlier.    Smiley Houseman, MD PGY 1 Family Medicine

## 2016-02-06 NOTE — Assessment & Plan Note (Signed)
Is high risk for cardiac cause with history of severe PVD, smoking, HTN, HLD. EKG in clinic showed no significant change from prior most recent EKG.  - given precautions to go to ED - Cardiology referral made and appointment date and time given to patient. Patient was not sure if she could afford the visit if it was earlier than March. Appointment on March 3rd. If patient is able to afford,i asked her to call the clinic to determine if she can been seen earlier.

## 2016-02-06 NOTE — Assessment & Plan Note (Signed)
Controlled. A1c is 6.3 today. However, patient has no sensation to monofilament on foot exam.  - continue current regimen  - discussed the importance of evaluating feet daily

## 2016-02-20 ENCOUNTER — Other Ambulatory Visit: Payer: Self-pay | Admitting: *Deleted

## 2016-02-20 DIAGNOSIS — M79673 Pain in unspecified foot: Secondary | ICD-10-CM

## 2016-02-23 ENCOUNTER — Ambulatory Visit (HOSPITAL_COMMUNITY)
Admission: RE | Admit: 2016-02-23 | Discharge: 2016-02-23 | Disposition: A | Discharge status: Home / Self Care | Payer: Medicare HMO | Source: Ambulatory Visit | Attending: Family Medicine | Admitting: Family Medicine

## 2016-02-23 DIAGNOSIS — R51 Headache: Secondary | ICD-10-CM | POA: Diagnosis not present

## 2016-02-23 DIAGNOSIS — D329 Benign neoplasm of meninges, unspecified: Secondary | ICD-10-CM | POA: Insufficient documentation

## 2016-02-23 DIAGNOSIS — G939 Disorder of brain, unspecified: Secondary | ICD-10-CM | POA: Diagnosis not present

## 2016-02-23 DIAGNOSIS — Z86018 Personal history of other benign neoplasm: Secondary | ICD-10-CM | POA: Diagnosis not present

## 2016-02-23 DIAGNOSIS — C9110 Chronic lymphocytic leukemia of B-cell type not having achieved remission: Secondary | ICD-10-CM | POA: Diagnosis not present

## 2016-02-23 DIAGNOSIS — R519 Headache, unspecified: Secondary | ICD-10-CM

## 2016-02-23 LAB — CREATININE, SERUM
Creatinine, Ser: 1.33 mg/dL — ABNORMAL HIGH (ref 0.44–1.00)
GFR calc Af Amer: 46 mL/min — ABNORMAL LOW (ref >60)
GFR calc non Af Amer: 40 mL/min — ABNORMAL LOW (ref >60)

## 2016-02-23 MED ORDER — GADOBENATE DIMEGLUMINE 529 MG/ML IV SOLN
11.0000 mL | Freq: Once | INTRAVENOUS | Status: AC | PRN
  Administered 2016-02-23: 11 mL via INTRAVENOUS

## 2016-02-24 ENCOUNTER — Telehealth: Payer: Self-pay | Admitting: *Deleted

## 2016-02-24 NOTE — Telephone Encounter (Signed)
Cassandra with radiology at [facility] said that patient's MRI showed no acute infarct or hemorrhage.  This is the initial reading and they will fax over results for provider to review.  If there are any questions provider can be call the radiologist at [phone]. Tenea Sens,CMA

## 2016-02-27 ENCOUNTER — Ambulatory Visit: Payer: Medicare HMO | Admitting: Cardiovascular Disease

## 2016-03-01 ENCOUNTER — Encounter: Payer: Self-pay | Admitting: Surgery

## 2016-03-02 ENCOUNTER — Telehealth: Payer: Self-pay | Admitting: Internal Medicine

## 2016-03-02 ENCOUNTER — Other Ambulatory Visit: Payer: Self-pay | Admitting: Internal Medicine

## 2016-03-02 DIAGNOSIS — D320 Benign neoplasm of cerebral meninges: Secondary | ICD-10-CM

## 2016-03-02 NOTE — Telephone Encounter (Addendum)
Called patient to give update on MRI Brain findings.   MRI brain shows plaque like meningioma near left parietal craniotomy and another enhancing lesion suggestive of small meningioma without mass effect or vasogenic edema.   Patient has history of meningioma s/p resection and history of CLL. Discussed with Dr. Ardelia Mems.  - will make Urgent referral to Neurosurgery.

## 2016-03-08 ENCOUNTER — Encounter: Payer: Medicare HMO | Admitting: Surgery

## 2016-03-08 ENCOUNTER — Encounter (HOSPITAL_COMMUNITY): Payer: Medicare HMO

## 2016-03-14 DIAGNOSIS — Z719 Counseling, unspecified: Secondary | ICD-10-CM

## 2016-03-16 DIAGNOSIS — Z86011 Personal history of benign neoplasm of the brain: Secondary | ICD-10-CM | POA: Diagnosis not present

## 2016-03-18 ENCOUNTER — Ambulatory Visit: Payer: Medicare HMO | Admitting: Cardiovascular Disease

## 2016-03-23 ENCOUNTER — Other Ambulatory Visit: Payer: Self-pay | Admitting: Internal Medicine

## 2016-03-30 ENCOUNTER — Other Ambulatory Visit: Payer: Self-pay | Admitting: *Deleted

## 2016-03-30 DIAGNOSIS — M549 Dorsalgia, unspecified: Principal | ICD-10-CM

## 2016-03-30 DIAGNOSIS — G8929 Other chronic pain: Secondary | ICD-10-CM

## 2016-03-31 MED ORDER — TRAMADOL HCL 50 MG PO TABS: 50.0000 mg | ORAL_TABLET | Freq: Four times a day (QID) | ORAL | Status: DC | PRN

## 2016-03-31 NOTE — Telephone Encounter (Signed)
Prescription printed and place at front desk. Please let patient know that she is able to pick up Rx at clinic. Thank you.

## 2016-03-31 NOTE — Telephone Encounter (Signed)
Spoke with patient and she is aware that script is ready for pick up. Margo Lama,CMA

## 2016-04-06 ENCOUNTER — Encounter: Payer: Self-pay | Admitting: Internal Medicine

## 2016-04-06 ENCOUNTER — Ambulatory Visit (INDEPENDENT_AMBULATORY_CARE_PROVIDER_SITE_OTHER): Payer: Medicare HMO | Admitting: Internal Medicine

## 2016-04-06 VITALS — BP 131/74 | HR 52 | Temp 97.7°F | Ht 62.0 in | Wt 130.4 lb

## 2016-04-06 DIAGNOSIS — M549 Dorsalgia, unspecified: Secondary | ICD-10-CM

## 2016-04-06 DIAGNOSIS — M25512 Pain in left shoulder: Secondary | ICD-10-CM

## 2016-04-06 DIAGNOSIS — G8929 Other chronic pain: Secondary | ICD-10-CM

## 2016-04-06 MED ORDER — DICLOFENAC SODIUM 1 % TD GEL: 2.0000 g | Freq: Three times a day (TID) | TRANSDERMAL | Status: DC

## 2016-04-06 NOTE — Assessment & Plan Note (Signed)
Gradual symptoms for 1 month. No weakness, numbness, tingling. Normal ROM, sensation, strength, and pulses. No injury per patient. Symptoms consistent with rotator cuff tendinopathy vs impingement. No prior history - given stretching exercises; encourage rest/ice - voltaren gel given (oral NSAID avoided with patient's history of PVD) - follow up in 2-4 weeks if symptoms do not improve. At that time consider steroid joint injection and/or x-ray

## 2016-04-06 NOTE — Patient Instructions (Signed)
I prescribe voltaren gel for your shoulder pain. Try the exercises for your pain. Follow up in 2-4 weeks if your symptoms do not improve

## 2016-04-06 NOTE — Progress Notes (Signed)
Patient ID: [name] [name], female   DOB: 12/12/1946, 69 y.o.   MRN: [id number] Date of Visit: 04/06/2016   HPI:  Patient is here to discuss left shoulder pain   Left Shoulder Pain:  - reports of intermittent shoulder pain and upper arm pain that started gradually over the past 1 month - no injury that she could recall - no past history of similar symptoms  - aggravating factors: patient denies  - alleviating factors: heat pack and tramadol help some - no numbness/tingling or weakness of left arm - no associated chest pain or shortness of breath - reports that she hears "popping" in shoulder sometimes  - no neck pain  - denies repetitive movements recently    Chronic Back Pain:  - reports that she continues to have pain; has not worsened  - tramadol helps some  - MRI lumbar spine done in 11/2015 noted bulging discs and shallow central disc protrusions at L4-5 and L5-S1, mild bilateral lateral recess stenosis at L5-S1 could potentially involve S1 nerve roots - was referred to PT, however patient had to see neurosurgery specialist recently and stated that she was not able to afford to go to PT as well; the plan was to refer to PT and if symptoms do not improve, to refer to orthopedics  - also reports that she has tried PT in the past which minimally helped with symptoms - denies urinary/bowel incontinence; denies saddle anesthesia (reports she has some numbness in her thigh from fem pop bypass surgery   ROS: See HPI.  [place]:  PVD Chronic Back Pain  PHYSICAL EXAM: BP 131/74 mmHg  Pulse 52  Temp(Src) 97.7 F (36.5 C) (Oral)  Ht 5\' 2"  (1.575 m)  Wt 130 lb 6.4 oz (59.149 kg)  BMI 23.84 kg/m2 Gen: NAD MSK: cervical spine- no midline tenderness; some paraspinal tenderness of left upper border of trapezius muscle.  Left Shoulder- No swelling noted; symmetrical compared to left shoulder,  tenderness on palpation in subacromial area and anterior shoulder near bicep tendon, range of  motion is normal but has pain with arm at maximal flexion and maximal abduction, and with internal rotation. Normal strength of shoulder and arm. Normal sensation to light touch. Radial pulse and ulnar pulses intact, capillary refill normal. Positive empty can test, positive neer test at 90 degrees of flexion, negative o'brien test   ASSESSMENT/PLAN:  Chronic back pain Due to continued symptoms (not worsening) and patient declined PT, will refer to Orthopedics   Shoulder pain, left Gradual symptoms for 1 month. No weakness, numbness, tingling. Normal ROM, sensation, strength, and pulses. No injury per patient. Symptoms consistent with rotator cuff tendinopathy vs impingement. No prior history - given stretching exercises; encourage rest/ice - voltaren gel given (oral NSAID avoided with patient's history of PVD) - follow up in 2-4 weeks if symptoms do not improve. At that time consider steroid joint injection and/or x-ray     FOLLOW UP: Follow up in 2-4 weeks for left shoulder   Smiley Houseman, MD PGY [place]

## 2016-04-06 NOTE — Assessment & Plan Note (Signed)
Due to continued symptoms (not worsening) and patient declined PT, will refer to Orthopedics

## 2016-04-27 DIAGNOSIS — M47816 Spondylosis without myelopathy or radiculopathy, lumbar region: Secondary | ICD-10-CM | POA: Diagnosis not present

## 2016-04-27 DIAGNOSIS — M4316 Spondylolisthesis, lumbar region: Secondary | ICD-10-CM | POA: Diagnosis not present

## 2016-04-27 DIAGNOSIS — M545 Low back pain: Secondary | ICD-10-CM | POA: Diagnosis not present

## 2016-04-29 ENCOUNTER — Other Ambulatory Visit: Payer: Self-pay | Admitting: Internal Medicine

## 2016-05-04 DIAGNOSIS — M47816 Spondylosis without myelopathy or radiculopathy, lumbar region: Secondary | ICD-10-CM | POA: Diagnosis not present

## 2016-05-04 DIAGNOSIS — M4316 Spondylolisthesis, lumbar region: Secondary | ICD-10-CM | POA: Diagnosis not present

## 2016-05-27 ENCOUNTER — Telehealth: Payer: Self-pay | Admitting: *Deleted

## 2016-05-27 DIAGNOSIS — M549 Dorsalgia, unspecified: Principal | ICD-10-CM

## 2016-05-27 DIAGNOSIS — G8929 Other chronic pain: Secondary | ICD-10-CM

## 2016-05-27 MED ORDER — FAMOTIDINE 20 MG PO TABS: 20.0000 mg | ORAL_TABLET | Freq: Every day | ORAL | Status: DC

## 2016-05-27 MED ORDER — SIMVASTATIN 20 MG PO TABS: 20.0000 mg | ORAL_TABLET | Freq: Every day | ORAL | Status: DC

## 2016-05-27 MED ORDER — ENALAPRIL MALEATE 20 MG PO TABS: 20.0000 mg | ORAL_TABLET | Freq: Two times a day (BID) | ORAL | Status: DC

## 2016-05-27 MED ORDER — METOPROLOL TARTRATE 100 MG PO TABS: 100.0000 mg | ORAL_TABLET | Freq: Two times a day (BID) | ORAL | Status: DC

## 2016-05-27 MED ORDER — LORATADINE 10 MG PO TABS: 10.0000 mg | ORAL_TABLET | Freq: Every day | ORAL | Status: DC

## 2016-05-27 MED ORDER — TRAMADOL HCL 50 MG PO TABS: 50.0000 mg | ORAL_TABLET | Freq: Four times a day (QID) | ORAL | Status: DC | PRN

## 2016-05-27 MED ORDER — CILOSTAZOL 50 MG PO TABS: 50.0000 mg | ORAL_TABLET | Freq: Two times a day (BID) | ORAL | Status: DC

## 2016-05-27 MED ORDER — CLOPIDOGREL BISULFATE 75 MG PO TABS: 75.0000 mg | ORAL_TABLET | Freq: Every day | ORAL | Status: DC

## 2016-05-27 MED ORDER — FLUTICASONE-SALMETEROL 250-50 MCG/DOSE IN AEPB: 1.0000 | INHALATION_SPRAY | Freq: Two times a day (BID) | RESPIRATORY_TRACT | Status: DC | PRN

## 2016-05-27 MED ORDER — HYDROCHLOROTHIAZIDE 25 MG PO TABS: 25.0000 mg | ORAL_TABLET | Freq: Every day | ORAL | Status: DC

## 2016-05-27 MED ORDER — METFORMIN HCL 1000 MG PO TABS: 1000.0000 mg | ORAL_TABLET | Freq: Two times a day (BID) | ORAL | Status: DC

## 2016-05-27 MED ORDER — MONTELUKAST SODIUM 10 MG PO TABS: 10.0000 mg | ORAL_TABLET | Freq: Every day | ORAL | Status: DC

## 2016-05-27 MED ORDER — ALBUTEROL SULFATE HFA 108 (90 BASE) MCG/ACT IN AERS: 2.0000 | INHALATION_SPRAY | RESPIRATORY_TRACT | Status: DC | PRN

## 2016-05-27 NOTE — Telephone Encounter (Signed)
Pt came in and said she needed a refill on her Tramadol as well as all of her other medicines, asked pt which other medicines and she said all of them, she didn't know all the named. She stated that pharmacy said she didn't have any refills on them. She said to send them to the [facility] on [address]. Forwarding to PCP. Katharina Caper, Enrrique Mierzwa D, [place]

## 2016-05-27 NOTE — Telephone Encounter (Signed)
Please call the patient and let her know that medications have been sent to pharmacy (including Tramadol). Please ask patient to make a follow up appointment.

## 2016-05-28 NOTE — Telephone Encounter (Signed)
Patient has an appt on 06-04-16. Hillary Struss,CMA

## 2016-06-04 ENCOUNTER — Ambulatory Visit (INDEPENDENT_AMBULATORY_CARE_PROVIDER_SITE_OTHER): Payer: Medicare HMO | Admitting: Internal Medicine

## 2016-06-04 ENCOUNTER — Encounter: Payer: Self-pay | Admitting: Internal Medicine

## 2016-06-04 VITALS — BP 140/78 | HR 50 | Temp 98.1°F | Ht 62.0 in | Wt 125.0 lb

## 2016-06-04 DIAGNOSIS — E1151 Type 2 diabetes mellitus with diabetic peripheral angiopathy without gangrene: Secondary | ICD-10-CM | POA: Diagnosis not present

## 2016-06-04 DIAGNOSIS — R7989 Other specified abnormal findings of blood chemistry: Secondary | ICD-10-CM

## 2016-06-04 DIAGNOSIS — C9110 Chronic lymphocytic leukemia of B-cell type not having achieved remission: Secondary | ICD-10-CM

## 2016-06-04 DIAGNOSIS — R748 Abnormal levels of other serum enzymes: Secondary | ICD-10-CM | POA: Diagnosis not present

## 2016-06-04 DIAGNOSIS — N183 Chronic kidney disease, stage 3 unspecified: Secondary | ICD-10-CM | POA: Insufficient documentation

## 2016-06-04 DIAGNOSIS — R0601 Orthopnea: Secondary | ICD-10-CM

## 2016-06-04 DIAGNOSIS — I10 Essential (primary) hypertension: Secondary | ICD-10-CM

## 2016-06-04 LAB — BASIC METABOLIC PANEL WITH GFR
BUN: 9 mg/dL (ref 7–25)
CO2: 29 mmol/L (ref 20–31)
Calcium: 9.2 mg/dL (ref 8.6–10.4)
Chloride: 105 mmol/L (ref 98–110)
Creat: 1.24 mg/dL — ABNORMAL HIGH (ref 0.50–0.99)
GFR, Est African American: 52 mL/min — ABNORMAL LOW (ref >=60)
GFR, Est Non African American: 45 mL/min — ABNORMAL LOW (ref >=60)
Glucose, Bld: 88 mg/dL (ref 65–99)
Potassium: 4.1 mmol/L (ref 3.5–5.3)
Sodium: 144 mmol/L (ref 135–146)

## 2016-06-04 LAB — POCT GLYCOSYLATED HEMOGLOBIN (HGB A1C): Hemoglobin A1C: 6.1

## 2016-06-04 MED ORDER — METFORMIN HCL 500 MG PO TABS: 500.0000 mg | ORAL_TABLET | Freq: Two times a day (BID) | ORAL | Status: DC

## 2016-06-04 MED ORDER — METOPROLOL TARTRATE 100 MG PO TABS: 50.0000 mg | ORAL_TABLET | Freq: Two times a day (BID) | ORAL | Status: DC

## 2016-06-04 NOTE — Assessment & Plan Note (Signed)
- 

## 2016-06-04 NOTE — Assessment & Plan Note (Addendum)
Blood pressure is at goal. Her HR is 50 and she reports dizziness at times, therefore will decrease from 100mg  BID to 50mg  BID.  - will repeat BMET to check creatinine

## 2016-06-04 NOTE — Assessment & Plan Note (Signed)
Made a referral a few months ago, however patient could not afford to go at that point.  - new referral to Oncology

## 2016-06-04 NOTE — Progress Notes (Signed)
Patient ID: [name] [name], female   DOB: 08/10/1946, 70 y.o.   MRN: [id number] Date of Visit: 06/04/2016   HPI:  Asthma vs COPD: - only been taking Singulair  - difficulty breathing everyday, intermittently - occurs at rest at times and with ambulation - cough daily (stable)  - wheezing - cannot lay flat because of SOB and HA; 3 pillows behind head - no LE swelling  - out of Advair and Albuterol since October; is unable to afford  - started in middle May - still smokes (discussed with Dr. Valentina Lucks at last visit)  DM2: - A1c in clinic today is 6.2  - takes Metformin 1000mg  BID  ROS: See HPI.  [place]:  HTN DM2 PVD CLL  PHYSICAL EXAM: BP 140/78 mmHg  Pulse 50  Temp(Src) 98.1 F (36.7 C) (Oral)  Ht 5\' 2"  (1.575 m)  Wt 125 lb (56.7 kg)  BMI 22.86 kg/m2  SpO2 99% Gen: NAD Heart: bradycardia, systolic murmur noted, no JVD Lungs: no increased work of breathing, CTAB Ext: no LE edema  ASSESSMENT/PLAN:  COPD Reports uncontrolled symptoms, but currently is not in an exacerbation. Patient reports that she has difficulty affording these medications. Unfortunately we do not have samples in clinic.  - Encouraged to try to get these medications from pharmacy  HYPERTENSION, BENIGN SYSTEMIC Blood pressure is at goal. Her HR is 50 and she reports dizziness at times, therefore will decrease from 100mg  BID to 50mg  BID.  - will repeat BMET to check creatinine   Chronic kidney disease (CKD), stage III (moderate) BMET today   DM (diabetes mellitus), type 2 with peripheral vascular complications Controlled A1c 6.2. - will decrease Metformin to 500mg  BID   Chronic lymphocytic leukemia ([place]) Made a referral a few months ago, however patient could not afford to go at that point.  - new referral to Oncology    FOLLOW UP: Follow up in 1 month   Smiley Houseman, MD [place]

## 2016-06-04 NOTE — Patient Instructions (Addendum)
PLEASE BRING YOUR MEDICATIONS TO EVERY DOCTOR'S VISIT.   Please continue to call your previous provider to try to get your records.   I have ordered an ECHO to evaluate your heart.   I made a referral to the oncologist for your CLL. Please try to keep your appointment with them. They will call to arrange this.   I decreased the dose of your Metoprolol: Take 50mg  twice a day  I also decreased your Metformin: Take Metformin 500mg  twice a day   Please make a follow up appointment in 1 month

## 2016-06-04 NOTE — Assessment & Plan Note (Signed)
Reports uncontrolled symptoms, but currently is not in an exacerbation. Patient reports that she has difficulty affording these medications. Unfortunately we do not have samples in clinic.  - Encouraged to try to get these medications from pharmacy

## 2016-06-04 NOTE — Assessment & Plan Note (Signed)
Controlled A1c 6.2. - will decrease Metformin to 500mg  BID

## 2016-06-20 DIAGNOSIS — Z719 Counseling, unspecified: Secondary | ICD-10-CM

## 2016-06-23 ENCOUNTER — Other Ambulatory Visit (HOSPITAL_COMMUNITY): Payer: Medicare HMO

## 2016-06-27 DIAGNOSIS — Z719 Counseling, unspecified: Secondary | ICD-10-CM

## 2016-06-28 NOTE — Congregational Nurse Program (Signed)
Congregational Nurse Program Note  Date of Encounter: 06/27/2016  Past Medical History: Past Medical History  Diagnosis Date  . CLL (chronic lymphoblastic leukemia)   . COPD (chronic obstructive pulmonary disease) ([place])   . Hyperlipidemia   . Hypertension   . PVD (peripheral vascular disease) ([place])   . Diabetes mellitus without complication ([place])   . Asthma   . GERD (gastroesophageal reflux disease)   . Stroke [facility])     in 1998 due to tumor     Encounter Details:     CNP Questionnaire - 06/27/16 0006    Patient Demographics   Is this a new or existing patient? Existing   Patient is considered a/an Not Applicable   Race African-American/Black   Patient Assistance   Location of Patient Assistance [employer]   Patient's financial/insurance status Low Income   Uninsured Patient No   Patient referred to apply for the following financial assistance Medicaid   Food insecurities addressed Not Applicable   Transportation assistance No   Assistance securing medications No   Educational health offerings Cardiac disease;Chronic disease;Diabetes;Health literacy;Navigating the healthcare system   Encounter Details   Primary purpose of visit Chronic Illness/Condition Visit;Education/Health Concerns;Navigating the Healthcare System   Was an Emergency Department visit averted? No   Does patient have a medical provider? Yes   Patient referred to Not Applicable   Was a mental health screening completed? (GAINS tool) No   Does patient have dental issues? Yes   Was a dental referral made? No resources for a referral   Does patient have vision issues? Yes   Was a vision referral made? No   Does your patient have an abnormal blood pressure today? No   Since previous encounter, have you referred patient for abnormal blood pressure that resulted in a new diagnosis or medication change? No   Does your patient have an abnormal blood glucose today? No   Since previous encounter, have you  referred patient for abnormal blood glucose that resulted in a new diagnosis or medication change? No   Was there a life-saving intervention made? No    Spoke with client who reports she is unable to get her asthma medications (inhaler, albuterol for neb, proair, advair) I looked at meds from last viist and these were on list.  I will have to call pcp to determine why not able to get.  Client is sometimes not sure why she would have to pay for meds.  Will followup with her this week.

## 2016-07-01 ENCOUNTER — Ambulatory Visit (INDEPENDENT_AMBULATORY_CARE_PROVIDER_SITE_OTHER): Payer: Medicare HMO | Admitting: Internal Medicine

## 2016-07-01 ENCOUNTER — Encounter: Payer: Self-pay | Admitting: Internal Medicine

## 2016-07-01 ENCOUNTER — Ambulatory Visit (HOSPITAL_COMMUNITY)
Admission: RE | Admit: 2016-07-01 | Discharge: 2016-07-01 | Disposition: A | Discharge status: Home / Self Care | Payer: Medicare HMO | Source: Ambulatory Visit | Attending: Family Medicine | Admitting: Family Medicine

## 2016-07-01 VITALS — BP 120/69 | HR 54 | Temp 98.4°F | Wt 125.0 lb

## 2016-07-01 DIAGNOSIS — R001 Bradycardia, unspecified: Secondary | ICD-10-CM | POA: Insufficient documentation

## 2016-07-01 DIAGNOSIS — R42 Dizziness and giddiness: Secondary | ICD-10-CM

## 2016-07-01 DIAGNOSIS — I10 Essential (primary) hypertension: Secondary | ICD-10-CM | POA: Diagnosis not present

## 2016-07-01 NOTE — Patient Instructions (Addendum)
I want you to stop taking Metoprolol and Hydrochlorothiazide. Make a follow up appointment at clinic on Monday 7/10. Keep your cardiology appointment on 7/13. We are also checking your thyroid level.  If you start to feel worse at any point, I want you to seek immediate medical care.

## 2016-07-01 NOTE — Assessment & Plan Note (Addendum)
EKG with sinus bradycardia. I think this may be causing the patient to feel lightheaded. Orthostatic vitals are negative - will discontinue Metoprolol. Will also discontinue HCTZ as BP is too controlled for age.  - follow up on Monday 7/10 - gave precautions to go to the ED - ECHO ordered at previous visit (per review, patient does have an appointment scheduled for this)  - Per patient, she also has a cardiology appointment on 7/13 - TSH ordered

## 2016-07-01 NOTE — Progress Notes (Signed)
Date of Visit: 07/01/2016   HPI: Patient is here for BP and HR follow up. Also reports of lightheadedness  Bradycardia: - reports she has been taking reduced dose of Metoprolol as we discussed   HTN: - in addition to Metoprolol, is taking HCTZ and Enalapril   Lightheadedness:  - reports this has been occurring intermittently "for a while" - had episode last night and today  - today's episode started at 1 pm - denies associated HA, sob, chest pain - denies focal neurological deficits - denies feeling like the room is spinning  - denies falls - reports she usually goes to sleep when episodes occur  - had one episode of emesis today, no prior episodes - associated with nausea and intermittent palpitations   ROS: See HPI.  [place]:  HTN PVD with bypass grafts and stent DM2 HLD  PHYSICAL EXAM: BP 120/69 mmHg  Pulse 54  Temp(Src) 98.4 F (36.9 C) (Oral)  Wt 125 lb (56.7 kg)  SpO2 98%  Orthostatic Vitals: lying down BP 119/58 HR 47; sitting up 120/68 HF 50; Standing 125/67 HR 53 Gen: nontoxic, NAD  HEENT: PERRL but small pupils, EMOI Heart: bradycardia, regular rhythm, systolic murmur Lungs: normal effort, CTAB Neuro: no focal deficits, strength 5/5 in upper and lower extremities, normal sensation in upper and lower extremities; CN 3-12 grossly intact  Ext: no LE edema   EKG: sinus bradycardia  ASSESSMENT/PLAN: Bradycardia EKG with sinus bradycardia. I think this may be causing the patient to feel lightheaded. Orthostatic vitals are negative - will discontinue Metoprolol. Will also discontinue HCTZ as BP is too controlled for age.  - follow up on Monday 7/10 - gave precautions to go to the ED - ECHO ordered at previous visit (per review, patient does have an appointment scheduled for this)  - Per patient, she also has a cardiology appointment on 7/13 - TSH ordered  HYPERTENSION, BENIGN SYSTEMIC Blood pressures may be too controlled for her age. Additionally she has  symptoms of lightheadedness.  - will continue Enalapril  - will discontinue HCTZ - close follow up on Monday 07/05/16   Smiley Houseman, MD PGY [place]

## 2016-07-01 NOTE — Assessment & Plan Note (Signed)
Blood pressures may be too controlled for her age. Additionally she has symptoms of lightheadedness.  - will continue Enalapril  - will discontinue HCTZ - close follow up on Monday 07/05/16

## 2016-07-02 LAB — TSH: TSH: 2.47 mIU/L

## 2016-07-04 DIAGNOSIS — Z719 Counseling, unspecified: Secondary | ICD-10-CM

## 2016-07-05 ENCOUNTER — Ambulatory Visit (INDEPENDENT_AMBULATORY_CARE_PROVIDER_SITE_OTHER): Payer: Medicare HMO | Admitting: Family Medicine

## 2016-07-05 ENCOUNTER — Encounter: Payer: Self-pay | Admitting: Family Medicine

## 2016-07-05 VITALS — BP 170/74 | HR 62 | Temp 97.4°F | Ht 62.0 in | Wt 126.0 lb

## 2016-07-05 DIAGNOSIS — R001 Bradycardia, unspecified: Secondary | ICD-10-CM | POA: Diagnosis not present

## 2016-07-05 DIAGNOSIS — M25512 Pain in left shoulder: Secondary | ICD-10-CM | POA: Diagnosis not present

## 2016-07-05 DIAGNOSIS — I10 Essential (primary) hypertension: Secondary | ICD-10-CM

## 2016-07-05 NOTE — Patient Instructions (Signed)
Start back on HCTZ 12.5mg  daily.  Follow-up appointment Thursday or Friday.  Use ice, tylenol, and icyhot as needed for shoulder pain Do these exercises twice daily.  Take care, Dr. B   Shoulder Range of Motion Exercises Shoulder range of motion (ROM) exercises are designed to keep the shoulder moving freely. They are often recommended for people who have shoulder pain. MOVEMENT EXERCISE When you are able, do this exercise 5-6 days per week, or as told by your health care provider. Work toward doing 2 sets of 10 swings. Pendulum Exercise How To Do This Exercise Lying Down 1. Lie face-down on a bed with your abdomen close to the side of the bed. 2. Let your arm hang over the side of the bed. 3. Relax your shoulder, arm, and hand. 4. Slowly and gently swing your arm forward and back. Do not use your neck muscles to swing your arm. They should be relaxed. If you are struggling to swing your arm, have someone gently swing it for you. When you do this exercise for the first time, swing your arm at a 15 degree angle for 15 seconds, or swing your arm 10 times. As pain lessens over time, increase the angle of the swing to 30-45 degrees. 5. Repeat steps 1-4 with the other arm. How To Do This Exercise While Standing 1. Stand next to a sturdy chair or table and hold on to it with your hand.  Bend forward at the waist.  Bend your knees slightly.  Relax your other arm and let it hang limp.  Relax the shoulder blade of the arm that is hanging and let it drop.  While keeping your shoulder relaxed, use body motion to swing your arm in small circles. The first time you do this exercise, swing your arm for about 30 seconds or 10 times. When you do it next time, swing your arm for a little longer.  Stand up tall and relax.  Repeat steps 1-7, this time changing the direction of the circles. 2. Repeat steps 1-8 with the other arm. STRETCHING EXERCISES Do these exercises 3-4 times per day on 5-6 days  per week or as told by your health care provider. Work toward holding the stretch for 20 seconds. Stretching Exercise 1 1. Lift your arm straight out in front of you. 2. Bend your arm 90 degrees at the elbow (right angle) so your forearm goes across your body and looks like the letter "L." 3. Use your other arm to gently pull the elbow forward and across your body. 4. Repeat steps 1-3 with the other arm. Stretching Exercise 2 You will need a towel or rope for this exercise. 1. Bend one arm behind your back with the palm facing outward. 2. Hold a towel with your other hand. 3. Reach the arm that holds the towel above your head, and bend that arm at the elbow. Your wrist should be behind your neck. 4. Use your free hand to grab the free end of the towel. 5. With the higher hand, gently pull the towel up behind you. 6. With the lower hand, pull the towel down behind you. 7. Repeat steps 1-6 with the other arm. STRENGTHENING EXERCISES Do each of these exercises at four different times of day (sessions) every day or as told by your health care provider. To begin with, repeat each exercise 5 times (repetitions). Work toward doing 3 sets of 12 repetitions or as told by your health care provider. Strengthening Exercise 1  You will need a light weight for this activity. As you grow stronger, you may use a heavier weight. 1. Standing with a weight in your hand, lift your arm straight out to the side until it is at the same height as your shoulder. 2. Bend your arm at 90 degrees so that your fingers are pointing to the ceiling. 3. Slowly raise your hand until your arm is straight up in the air. 4. Repeat steps 1-3 with the other arm. Strengthening Exercise 2 You will need a light weight for this activity. As you grow stronger, you may use a heavier weight. 1. Standing with a weight in your hand, gradually move your straight arm in an arc, starting at your side, then out in front of you, then straight up  over your head. 2. Gradually move your other arm in an arc, starting at your side, then out in front of you, then straight up over your head. 3. Repeat steps 1-2 with the other arm. Strengthening Exercise 3 You will need an elastic band for this activity. As you grow stronger, gradually increase the size of the bands or increase the number of bands that you use at one time. 1. While standing, hold an elastic band in one hand and raise that arm up in the air. 2. With your other hand, pull down the band until that hand is by your side. 3. Repeat steps 1-2 with the other arm.   This information is not intended to replace advice given to you by your health care provider. Make sure you discuss any questions you have with your health care provider.   Document Released: 09/11/2003 Document Revised: 04/29/2015 Document Reviewed: 12/09/2014 Elsevier Interactive Patient Education [employer].

## 2016-07-05 NOTE — Assessment & Plan Note (Signed)
No radicular symptoms Decreased abduction ROM, otherwise normal exam No evidence of cardiac source Likely rotator cuff pathology ROM exercises, ice, tylneol F/u in 2-4 weeks Consider subacromial injection at that time

## 2016-07-05 NOTE — Assessment & Plan Note (Signed)
Likely was cause of lightheadedness Now HR in 60s Continue to hold MEtoprolol F/u Echo later this week

## 2016-07-05 NOTE — Assessment & Plan Note (Signed)
BP now elevated, even on manual recheck Resume HCTZ 12.5mg  daily Continue enalapril Continue to hold metoprolol F/u in 3-4 days

## 2016-07-05 NOTE — Progress Notes (Signed)
   Subjective:   [name] [name] is a 70 y.o. female with a history of HTN, PVD, COPD, GERD, T2DM, CKD III here for BP and dizziness f/u  BP - patient seen 7/6 with BP 120/69 and complaints of lightheadedness - negative orthostatics at that visit - EKG sinus bradycardia on 7/6 - HCTZ and Metoprolol were held - TSH wnl - scheduled for ECHO 7/13 - no CP, SOB, HA - lightheadedness resolved - no weakness, numbness (does have numbness around L knee at baseline, unchanged) - denies falls, N/V   Having constant pain in L shoulder  - was prescribed voltaren - too expensive - has had in past, unchanged - no assoc SOB, CP - no previous injury - icyhot makes it feel better   Review of Systems:  Per HPI.   Social History: current smoker  Objective:  BP 170/74 mmHg  Pulse 62  Temp(Src) 97.4 F (36.3 C) (Oral)  Ht 5\' 2"  (1.575 m)  Wt 126 lb (57.153 kg)  BMI 23.04 kg/m2  Gen:  70 y.o. female in NAD HEENT: NCAT, MMM, EOMI, PERRL, anicteric sclerae CV: RRR, no MRG Resp: Non-labored, CTAB, no wheezes noted Ext: WWP, no edema MSK: Shoulder: Inspection reveals no abnormalities, atrophy or asymmetry. Palpation is normal with no tenderness over AC joint or bicipital groove. ROM limited to 100 deg of abduction, otherwise full. Rotator cuff strength normal throughout. + Hawkin's tests Speeds and Yergason's tests normal. No labral pathology noted with negative Obrien's, negative clunk and good stability. Neuro: Alert and oriented, speech normal, sensation grossly intact     Assessment & Plan:     [name] [name] is a 70 y.o. female here for   HYPERTENSION, BENIGN SYSTEMIC BP now elevated, even on manual recheck Resume HCTZ 12.5mg  daily Continue enalapril Continue to hold metoprolol F/u in 3-4 days  Bradycardia Likely was cause of lightheadedness Now HR in 60s Continue to hold MEtoprolol F/u Echo later this week   Shoulder pain, left No radicular symptoms Decreased  abduction ROM, otherwise normal exam No evidence of cardiac source Likely rotator cuff pathology ROM exercises, ice, tylneol F/u in 2-4 weeks Consider subacromial injection at that time      Virginia Crews, MD MPH PGY-3,  [facility] 07/05/2016  9:03 AM

## 2016-07-08 ENCOUNTER — Other Ambulatory Visit: Payer: Self-pay

## 2016-07-08 ENCOUNTER — Ambulatory Visit (HOSPITAL_COMMUNITY): Payer: Medicare HMO | Attending: Family Medicine

## 2016-07-08 DIAGNOSIS — R0601 Orthopnea: Secondary | ICD-10-CM | POA: Diagnosis not present

## 2016-07-08 DIAGNOSIS — I739 Peripheral vascular disease, unspecified: Secondary | ICD-10-CM | POA: Insufficient documentation

## 2016-07-08 DIAGNOSIS — R0600 Dyspnea, unspecified: Secondary | ICD-10-CM | POA: Diagnosis present

## 2016-07-08 DIAGNOSIS — N189 Chronic kidney disease, unspecified: Secondary | ICD-10-CM | POA: Diagnosis not present

## 2016-07-08 DIAGNOSIS — I1310 Hypertensive heart and chronic kidney disease without heart failure, with stage 1 through stage 4 chronic kidney disease, or unspecified chronic kidney disease: Secondary | ICD-10-CM | POA: Diagnosis not present

## 2016-07-08 DIAGNOSIS — E1122 Type 2 diabetes mellitus with diabetic chronic kidney disease: Secondary | ICD-10-CM | POA: Diagnosis not present

## 2016-07-09 ENCOUNTER — Ambulatory Visit (INDEPENDENT_AMBULATORY_CARE_PROVIDER_SITE_OTHER): Payer: Medicare HMO | Admitting: Family Medicine

## 2016-07-09 ENCOUNTER — Encounter: Payer: Self-pay | Admitting: Family Medicine

## 2016-07-09 VITALS — BP 158/80 | HR 57 | Temp 98.2°F | Ht 62.0 in | Wt 125.6 lb

## 2016-07-09 DIAGNOSIS — I10 Essential (primary) hypertension: Secondary | ICD-10-CM | POA: Diagnosis not present

## 2016-07-09 DIAGNOSIS — M25512 Pain in left shoulder: Secondary | ICD-10-CM

## 2016-07-09 MED ORDER — HYDROCHLOROTHIAZIDE 25 MG PO TABS: 12.5000 mg | ORAL_TABLET | Freq: Every day | ORAL | Status: DC

## 2016-07-09 NOTE — Progress Notes (Signed)
Subjective:     Patient ID: [name] [name], female   DOB: 17-Jun-1946, 70 y.o.   MRN: [id number]  HPI Mrs. Zelasko is a 70yo female presenting today for follow up of left shoulder pain and blood pressure.  # Left Shoulder Pain: - Present for a little more than a week - Worse at night - Located over posterior and lateral left shoulder - Able to fasten bra, comb hair, although both are painful - Denies history of trauma - Voltaren too expensive, Icy Hot helping some  # Hypertension: - Dizziness improved, but still occurs rarely. Mostly occurs when rising too fast from sitting to standing. - Currently taking HCTZ 12.5mg  and Enalapril. Holding Metoprolol - Echo on 07/08/16 with Grade I Diastolic Dysfunction - Denies chest pain, shortness of breath, headache - Has not taken BP medication today - Notes blurred vision with migraines, consistent with prior - Reports she will be on vacation in [place] for two weeks and will be unable to come in until after she returns.  - Smoker  Review of Systems Per HPI. Other systems negative.    Objective:   Physical Exam  Constitutional: She appears well-developed and well-nourished. No distress.  HENT:  Head: Normocephalic and atraumatic.  Cardiovascular: Normal rate and regular rhythm.  Exam reveals no gallop and no friction rub.   No murmur heard. Pulmonary/Chest: Effort normal. No respiratory distress. She has no wheezes.  Musculoskeletal: She exhibits no edema.  Tenderness over left trapezius. ROM of left shoulder limited to 90degrees abduction in both passive and active ROM. Internal and External ROM symmetric in bilateral shoulders. Increased pain on left with all rotator cuff testing (supraspinatus, infraspinatus, teres minor, subscapularis) but no weakness. + Hawkins.   Psychiatric: She has a normal mood and affect. Her behavior is normal.      Assessment and Plan:     HYPERTENSION, BENIGN SYSTEMIC - BP elevated to 168/75 today. Did  not take BP medication. - Will increase HCTZ back to 25mg  daily. Continue Enalapril. Continue to hold Metoprolol.  - Prescription for Blood Pressure cuff given. Encouraged to check once daily and keep log. - Red flag symptoms reviewed. - Follow up in two weeks for nurses visit to check BP   Shoulder pain, left - Suspect rotator cuff tendinopathy with possible impingement.  - Recommended imaging of left shoulder, however patient states she is unable to have that done at this time - Continue conservative measurements. To continue exercise and OTC treatment. Recommended salonpas pain patches, especially over left trapezius.   Note, reported several times that her husband was not happy with her frequent trips to office and would not understand if she needed to come back soon. Husband came into room at end and discussed the importance of getting her blood pressure under control.

## 2016-07-09 NOTE — Patient Instructions (Signed)
Thank you so much for coming to visit me today! # Blood Pressure: - Please increase your blood pressure medicine to Hydrochlorothiazide 25mg  and Enalapril. Please do NOT take the Metoprolol. - If you develop blurred vision, chest pain, shortness of breath, weakness, please call 911. - Please get a blood pressure cuff if possible. Check your blood pressure once a day after resting for at least 33minutes. Please write your blood pressure down in a log and bring it to your next visit. - Please return soon after you get back from [place] for a Nurses Visit to check your blood pressure. Please bring your blood pressure log if you have one.  # Shoulder: - I would recommend imaging of your shoulder at some time. - Please continue to use [facility] as needed - You may also try Salonpas patches, which you can get in most drug stores - Please continue to do your exercises.  Dr. Gerlean Ren

## 2016-07-09 NOTE — Assessment & Plan Note (Signed)
-   Suspect rotator cuff tendinopathy with possible impingement.  - Recommended imaging of left shoulder, however patient states she is unable to have that done at this time - Continue conservative measurements. To continue exercise and OTC treatment. Recommended salonpas pain patches, especially over left trapezius.

## 2016-07-09 NOTE — Assessment & Plan Note (Addendum)
-   BP elevated to 168/75 today. Did not take BP medication. - Will increase HCTZ back to 25mg  daily. Continue Enalapril. Continue to hold Metoprolol.  - Prescription for Blood Pressure cuff given. Encouraged to check once daily and keep log. - Red flag symptoms reviewed. - Follow up in two weeks for nurses visit to check BP

## 2016-07-12 ENCOUNTER — Encounter: Payer: Self-pay | Admitting: Internal Medicine

## 2016-07-13 ENCOUNTER — Ambulatory Visit: Payer: Medicare HMO | Admitting: Internal Medicine

## 2016-07-30 NOTE — Congregational Nurse Program (Signed)
Congregational Nurse Program Note  Date of Encounter: 07/04/2016  Past Medical History: Past Medical History:  Diagnosis Date  . Asthma   . CLL (chronic lymphoblastic leukemia)   . COPD (chronic obstructive pulmonary disease) ([place])   . Diabetes mellitus without complication ([place])   . GERD (gastroesophageal reflux disease)   . Hyperlipidemia   . Hypertension   . PVD (peripheral vascular disease) ([place])   . Stroke [facility])    in 1998 due to tumor     Encounter Details:     CNP Questionnaire - 07/04/16 0215      Patient Demographics   Is this a new or existing patient? Existing   Patient is considered a/an Not Applicable   Race African-American/Black     Patient Assistance   Location of Patient Assistance [employer]   Patient's financial/insurance status Low Income   Uninsured Patient No   Patient referred to apply for the following financial assistance Medicaid   Food insecurities addressed Not Applicable   Transportation assistance No   Assistance securing medications No   Educational health offerings Cardiac disease;Chronic disease;Diabetes;Health literacy;Navigating the healthcare system     Encounter Details   Primary purpose of visit Chronic Illness/Condition Visit;Education/Health Concerns;Navigating the Healthcare System   Was an Emergency Department visit averted? No   Does patient have a medical provider? Yes   Patient referred to Not Applicable   Was a mental health screening completed? (GAINS tool) No   Does patient have dental issues? Yes   Was a dental referral made? No resources for a referral   Does patient have vision issues? Yes   Was a vision referral made? No   Does your patient have an abnormal blood pressure today? No   Since previous encounter, have you referred patient for abnormal blood pressure that resulted in a new diagnosis or medication change? No   Does your patient have an abnormal blood glucose today? No   Since previous encounter,  have you referred patient for abnormal blood glucose that resulted in a new diagnosis or medication change? No   Was there a life-saving intervention made? No         Amb Nursing Assessment - 07/09/16 0832      Pre-visit preparation   Pre-visit preparation completed Yes     Abuse/Neglect Assessment   Do you feel unsafe in your current relationship? No   Do you feel physically threatened by others? No   Anyone hurting you at home, work, or school? No     Client seen at [facility].  She has been having left shoulder pain.  Not sure if it is her arthritis.,  Has dr. Mitzie Na 7/10.  Will followup.

## 2016-08-05 ENCOUNTER — Other Ambulatory Visit: Payer: Self-pay | Admitting: Internal Medicine

## 2016-08-05 MED ORDER — METFORMIN HCL 500 MG PO TABS: 500.0000 mg | ORAL_TABLET | Freq: Two times a day (BID) | ORAL | 0 refills | Status: DC

## 2016-08-08 DIAGNOSIS — Z719 Counseling, unspecified: Secondary | ICD-10-CM

## 2016-08-10 NOTE — Congregational Nurse Program (Signed)
Congregational Nurse Program Note  Date of Encounter: 08/08/2016  Past Medical History: Past Medical History:  Diagnosis Date  . Asthma   . CLL (chronic lymphoblastic leukemia)   . COPD (chronic obstructive pulmonary disease) ([place])   . Diabetes mellitus without complication ([place])   . GERD (gastroesophageal reflux disease)   . Hyperlipidemia   . Hypertension   . PVD (peripheral vascular disease) ([place])   . Stroke [facility])    in 1998 due to tumor     Encounter Details:     CNP Questionnaire - 08/08/16 2338      Patient Demographics   Is this a new or existing patient? Existing   Patient is considered a/an Not Applicable   Race African-American/Black     Patient Assistance   Location of Patient Assistance [employer]   Patient's financial/insurance status Low Income   Uninsured Patient No   Patient referred to apply for the following financial assistance Medicaid   Food insecurities addressed Not Applicable   Transportation assistance No   Assistance securing medications No   Educational health offerings Cardiac disease;Chronic disease;Diabetes;Health literacy;Navigating the healthcare system     Encounter Details   Primary purpose of visit Chronic Illness/Condition Visit;Education/Health Concerns;Navigating the Healthcare System   Was an Emergency Department visit averted? No   Does patient have a medical provider? Yes   Was a mental health screening completed? (GAINS tool) No   Does patient have dental issues? Yes   Was a dental referral made? No resources for a referral   Does patient have vision issues? Yes   Was a vision referral made? No   Does your patient have an abnormal blood pressure today? No   Since previous encounter, have you referred patient for abnormal blood pressure that resulted in a new diagnosis or medication change? No   Does your patient have an abnormal blood glucose today? No   Since previous encounter, have you referred patient for abnormal  blood glucose that resulted in a new diagnosis or medication change? No   Was there a life-saving intervention made? No      Client was seen today briefly.  She was seen at [facility].  She wanted to get my number again as she is having problems with her medications and needs to talk with me.  Put my phone number in her phone so she has it.  Reports she is having pain in shoulder. Will followup.

## 2016-08-10 NOTE — Congregational Nurse Program (Signed)
Congregational Nurse Program Note  Date of Encounter: 06/20/2016  Past Medical History: Past Medical History:  Diagnosis Date  . Asthma   . CLL (chronic lymphoblastic leukemia)   . COPD (chronic obstructive pulmonary disease) ([place])   . Diabetes mellitus without complication ([place])   . GERD (gastroesophageal reflux disease)   . Hyperlipidemia   . Hypertension   . PVD (peripheral vascular disease) ([place])   . Stroke [facility])    in 1998 due to tumor     Encounter Details:   Client seen at [facility]  She reports she is having trouble getting medication for her asthma.  She goes to [facility].  She wanted to call me to go over her meds and discuss her medical issues.  Made myself available by phone anytime.  Will followup

## 2016-08-16 ENCOUNTER — Other Ambulatory Visit: Payer: Self-pay | Admitting: Internal Medicine

## 2016-08-16 ENCOUNTER — Other Ambulatory Visit: Payer: Self-pay | Admitting: *Deleted

## 2016-08-16 DIAGNOSIS — G8929 Other chronic pain: Secondary | ICD-10-CM

## 2016-08-16 DIAGNOSIS — M549 Dorsalgia, unspecified: Principal | ICD-10-CM

## 2016-08-17 MED ORDER — TRAMADOL HCL 50 MG PO TABS: 50.0000 mg | ORAL_TABLET | Freq: Two times a day (BID) | ORAL | 0 refills | Status: DC | PRN

## 2016-09-07 ENCOUNTER — Ambulatory Visit (INDEPENDENT_AMBULATORY_CARE_PROVIDER_SITE_OTHER): Payer: Medicare HMO | Admitting: Family Medicine

## 2016-09-07 ENCOUNTER — Encounter: Payer: Self-pay | Admitting: Family Medicine

## 2016-09-07 DIAGNOSIS — M25512 Pain in left shoulder: Secondary | ICD-10-CM | POA: Diagnosis not present

## 2016-09-07 DIAGNOSIS — M549 Dorsalgia, unspecified: Secondary | ICD-10-CM | POA: Diagnosis not present

## 2016-09-07 DIAGNOSIS — G8929 Other chronic pain: Secondary | ICD-10-CM | POA: Diagnosis not present

## 2016-09-07 MED ORDER — METHYLPREDNISOLONE ACETATE 40 MG/ML IJ SUSP
40.0000 mg | Freq: Once | INTRAMUSCULAR | Status: AC
  Administered 2016-09-07: 40 mg via INTRA_ARTICULAR

## 2016-09-07 MED ORDER — TRAMADOL HCL 50 MG PO TABS: 50.0000 mg | ORAL_TABLET | Freq: Two times a day (BID) | ORAL | 0 refills | Status: DC | PRN

## 2016-09-07 MED ORDER — CYCLOBENZAPRINE HCL 10 MG PO TABS
10.0000 mg | ORAL_TABLET | Freq: Two times a day (BID) | ORAL | 1 refills | Status: DC
Start: 2016-09-07 — End: 2016-11-04

## 2016-09-07 NOTE — Assessment & Plan Note (Addendum)
Suspect rotator cuff tendonitis given positive neers and hawkins.  Patient has not had much relief with NSAIDS, tramadol or PT.  Was offered shoulder injection today and patient agreed.  - L shoulder injection (see above) - continue PT exercises and OTC medication - f/u in 98mo

## 2016-09-07 NOTE — Patient Instructions (Addendum)
[name] [name] today you were seen for your persistent left shoulder and arm pain.  You received a steroid injection into your left shoulder.  You will likely feel a bit more uncomfortable tonight and into tomorrow. However I suspect that you will feel much better as the week ends.    I have refilled your prescription for flexeril (muscle relaxer) and tramadol.   I would like you to come back in a few months to be reevaluated for your shoulder pain.  Please continue with your shoulder exercises that you learned at physical therapy.    Thanks for coming in today.  Eliodoro Gullett L. Rosalyn Gess, [facility] Resident PGY-1 09/07/2016 12:25 PM

## 2016-09-07 NOTE — Addendum Note (Signed)
Addended by: Leonia Corona R on: 09/07/2016 02:53 PM   Modules accepted: Orders

## 2016-09-07 NOTE — Progress Notes (Addendum)
    Subjective:  [name] [name] is a 70 y.o. female who presents to the [facility] today with a chief complaint of L arm and shoulder pain.  HPI: [name] [name] presents with L shoulder and arm pain for 5-6 months.  She has been seen several times in clinic to date.  She has had no trauma or falls and is unclear what could have caused the pain.  We have recommended imaging and she has tried numerous medications and physical therapy.  She reportedly works on her exercises daily.  She rates her pain 8/10 at a baseline and at presentation she was 9/10.  She does note some occassional numbness and tingling down her bilateral arms into her fingers.  She does have a history of diabetes and notes that these symptoms are not new.    PMH: diabetes and HTN, chronic back pain  Objective:  Physical Exam: BP (!) 141/98 (BP Location: Right Arm, Patient Position: Sitting, Cuff Size: Normal)   Pulse (!) 57   Temp 97.7 F (36.5 C) (Oral)   Ht 5\' 2"  (1.575 m)   Wt 120 lb 12.8 oz (54.8 kg)   SpO2 95%   BMI 22.09 kg/m   Gen: 70yo F in NA, but appears uncomfotable CV: RRR with no murmurs appreciated Pulm: NWOB, CTAB with no crackles, wheezes, or rhonchi GI: Normal bowel sounds present. Soft, Nontender, Nondistended. MSK: L shoulder: no visual deformities, atrophy or asymetry.  Tenderness at [facility] joint, bicipital groove and spine of scapula. Full ROM.  + hawkins, neers and speeds test. Skin: warm, dry Neuro: grossly normal, moves all extremities Psych: Normal affect and thought content  INJECTION: Patient was given informed consent, signed copy in the chart. Appropriate time out was taken. Area prepped and draped in usual sterile fashion. 1 cc of depo-medrol 40 mg/ml plus  4 cc of 1% lidocaine without epinephrine was injected into the L shoulder using a(n) posterior approach. The patient tolerated the procedure well. There were no complications. Post procedure instructions were given.  No results found for this or any  previous visit (from the past 72 hour(s)).   Assessment/Plan:  Shoulder pain, left Suspect rotator cuff tendonitis given positive neers and hawkins.  Patient has not had much relief with NSAIDS, tramadol or PT.  Was offered shoulder injection today and patient agreed.  - L shoulder injection (see above) - continue PT exercises and OTC medication - f/u in 66mo

## 2016-09-10 ENCOUNTER — Other Ambulatory Visit: Payer: Self-pay | Admitting: Internal Medicine

## 2016-09-10 DIAGNOSIS — Z0000 Encounter for general adult medical examination without abnormal findings: Secondary | ICD-10-CM | POA: Diagnosis not present

## 2016-09-10 DIAGNOSIS — E1151 Type 2 diabetes mellitus with diabetic peripheral angiopathy without gangrene: Secondary | ICD-10-CM | POA: Diagnosis not present

## 2016-09-10 DIAGNOSIS — K219 Gastro-esophageal reflux disease without esophagitis: Secondary | ICD-10-CM | POA: Diagnosis not present

## 2016-09-10 DIAGNOSIS — E785 Hyperlipidemia, unspecified: Secondary | ICD-10-CM | POA: Diagnosis not present

## 2016-09-10 DIAGNOSIS — I2510 Atherosclerotic heart disease of native coronary artery without angina pectoris: Secondary | ICD-10-CM | POA: Diagnosis not present

## 2016-09-10 DIAGNOSIS — J449 Chronic obstructive pulmonary disease, unspecified: Secondary | ICD-10-CM | POA: Diagnosis not present

## 2016-09-10 DIAGNOSIS — I119 Hypertensive heart disease without heart failure: Secondary | ICD-10-CM | POA: Diagnosis not present

## 2016-09-23 DIAGNOSIS — Z719 Counseling, unspecified: Secondary | ICD-10-CM

## 2016-10-19 ENCOUNTER — Other Ambulatory Visit: Payer: Self-pay | Admitting: Internal Medicine

## 2016-10-19 NOTE — Telephone Encounter (Signed)
Pt wanted to let us know [facility] pharmacy will be calling because pt is switching to them Pt will be needing a 90 day supply. Please advise. Thanks! ep

## 2016-10-21 ENCOUNTER — Other Ambulatory Visit: Payer: Self-pay | Admitting: Internal Medicine

## 2016-10-21 MED ORDER — METOPROLOL TARTRATE 100 MG PO TABS: 50.0000 mg | ORAL_TABLET | Freq: Two times a day (BID) | ORAL | 0 refills | Status: DC

## 2016-10-21 MED ORDER — ALBUTEROL SULFATE HFA 108 (90 BASE) MCG/ACT IN AERS: 2.0000 | INHALATION_SPRAY | RESPIRATORY_TRACT | 5 refills | Status: DC | PRN

## 2016-10-21 MED ORDER — FAMOTIDINE 20 MG PO TABS: 20.0000 mg | ORAL_TABLET | Freq: Every day | ORAL | 0 refills | Status: DC | PRN

## 2016-10-21 MED ORDER — CILOSTAZOL 50 MG PO TABS
50.0000 mg | ORAL_TABLET | Freq: Two times a day (BID) | ORAL | 0 refills | Status: DC
Start: 2016-10-21 — End: 2016-10-26

## 2016-10-21 MED ORDER — ENALAPRIL MALEATE 20 MG PO TABS
20.0000 mg | ORAL_TABLET | Freq: Two times a day (BID) | ORAL | 0 refills | Status: DC
Start: 2016-10-21 — End: 2016-10-26

## 2016-10-21 MED ORDER — TRAMADOL HCL 50 MG PO TABS
50.0000 mg | ORAL_TABLET | Freq: Two times a day (BID) | ORAL | 0 refills | Status: DC | PRN
Start: 2016-10-21 — End: 2016-10-26

## 2016-10-21 MED ORDER — SIMVASTATIN 20 MG PO TABS: 20.0000 mg | ORAL_TABLET | Freq: Every day | ORAL | 0 refills | Status: DC

## 2016-10-21 MED ORDER — HYDROCHLOROTHIAZIDE 25 MG PO TABS: 25.0000 mg | ORAL_TABLET | Freq: Every day | ORAL | 0 refills | Status: DC

## 2016-10-21 MED ORDER — METFORMIN HCL 500 MG PO TABS: 500.0000 mg | ORAL_TABLET | Freq: Two times a day (BID) | ORAL | 0 refills | Status: DC

## 2016-10-21 MED ORDER — MONTELUKAST SODIUM 10 MG PO TABS: 10.0000 mg | ORAL_TABLET | Freq: Every day | ORAL | 0 refills | Status: DC

## 2016-10-21 NOTE — Telephone Encounter (Signed)
Printed prescriptions and placed in the to fax box in office so that prescriptions can be sent to [facility]. Spoke with representative at [facility] who recommended printing and faxing prescriptions. Fax number given: [phone]

## 2016-10-21 NOTE — Telephone Encounter (Signed)
Pt called again, stating her pcp needs to get in contact with [facility] pharmacy so pt can have her medications mailed to her. Please advise. Thanks! ep

## 2016-10-21 NOTE — Progress Notes (Signed)
Opened in error

## 2016-10-25 ENCOUNTER — Telehealth: Payer: Self-pay | Admitting: Internal Medicine

## 2016-10-25 NOTE — Telephone Encounter (Signed)
Pt is calling because the mail order pharmacy has not received the prescriptions we sent on 10/21/16. Can we re-fax or call ans see what the problem is . The fax number is [phone]. jw

## 2016-10-26 MED ORDER — ALBUTEROL SULFATE HFA 108 (90 BASE) MCG/ACT IN AERS: 2.0000 | INHALATION_SPRAY | RESPIRATORY_TRACT | 5 refills | Status: DC | PRN

## 2016-10-26 MED ORDER — TRAMADOL HCL 50 MG PO TABS: 50.0000 mg | ORAL_TABLET | Freq: Two times a day (BID) | ORAL | 0 refills | Status: DC | PRN

## 2016-10-26 MED ORDER — CILOSTAZOL 50 MG PO TABS: 50.0000 mg | ORAL_TABLET | Freq: Two times a day (BID) | ORAL | 0 refills | Status: DC

## 2016-10-26 MED ORDER — FLUTICASONE-SALMETEROL 250-50 MCG/DOSE IN AEPB: 1.0000 | INHALATION_SPRAY | Freq: Two times a day (BID) | RESPIRATORY_TRACT | 2 refills | Status: DC | PRN

## 2016-10-26 MED ORDER — METOPROLOL TARTRATE 100 MG PO TABS: 50.0000 mg | ORAL_TABLET | Freq: Two times a day (BID) | ORAL | 0 refills | Status: DC

## 2016-10-26 MED ORDER — ENALAPRIL MALEATE 20 MG PO TABS: 20.0000 mg | ORAL_TABLET | Freq: Two times a day (BID) | ORAL | 0 refills | Status: DC

## 2016-10-26 MED ORDER — MONTELUKAST SODIUM 10 MG PO TABS: 10.0000 mg | ORAL_TABLET | Freq: Every day | ORAL | 0 refills | Status: DC

## 2016-10-26 MED ORDER — SIMVASTATIN 20 MG PO TABS: 20.0000 mg | ORAL_TABLET | Freq: Every day | ORAL | 0 refills | Status: DC

## 2016-10-26 MED ORDER — CLOPIDOGREL BISULFATE 75 MG PO TABS: 75.0000 mg | ORAL_TABLET | Freq: Every day | ORAL | 4 refills | Status: DC

## 2016-10-26 MED ORDER — HYDROCHLOROTHIAZIDE 25 MG PO TABS: 25.0000 mg | ORAL_TABLET | Freq: Every day | ORAL | 0 refills | Status: DC

## 2016-10-26 MED ORDER — METFORMIN HCL 500 MG PO TABS: 500.0000 mg | ORAL_TABLET | Freq: Two times a day (BID) | ORAL | 0 refills | Status: DC

## 2016-10-26 MED ORDER — FAMOTIDINE 20 MG PO TABS: 20.0000 mg | ORAL_TABLET | Freq: Every day | ORAL | 0 refills | Status: DC | PRN

## 2016-10-26 NOTE — Telephone Encounter (Signed)
Dr. Dallas Schimke I looked in the faxes for the 26th and the 27th, I could not find it. If you could get everything reprinted I would be happy to fax it out today for you. ep

## 2016-10-26 NOTE — Telephone Encounter (Signed)
I sent all prescriptions by e-prescribe to [facility] pharmacy. I printed Tramadol Rx to fax again. I printed the other prescriptions as well and will place in the fax box to re-fax.

## 2016-10-27 ENCOUNTER — Telehealth: Payer: Self-pay

## 2016-10-27 NOTE — Telephone Encounter (Signed)
Yes I attempted to e-prescribe, but also re-faxed all prescriptions yesterday. It would be great if you could call the pharmacy.

## 2016-10-27 NOTE — Telephone Encounter (Signed)
Just to clarify- the medications you e-prescribed yesterday for pt- did you also print them to be faxed? The e-prescribed did not go through. If they were faxed then nothing else needs to be done. I can call to make sure they were received by the pharmacy.

## 2016-10-28 ENCOUNTER — Other Ambulatory Visit: Payer: Self-pay | Admitting: Internal Medicine

## 2016-10-28 NOTE — Progress Notes (Signed)
   [facility] Phone: [phone]   Date of Visit: 10/29/2016   HPI:  HTN:  - Enalapril and HCTZ - watching her salt intake  - still taking metoprolol 50mg  BID. Reports feels less dizzy with the lower dose (changed from 100mg  BID).  - has not gotten  - has had chest pains twice since July. Tightness with intermittent sob and feels warm. This has happened when she is cleaning; when she stops it will ease up. Resolves in about 5 mins. Has not increased in frequency. Last episode was a week ago.  - no prior history of MI to her knowledge.  - she declines an EKG today   ROS: See HPI.  [place]:  PMH:  HTN PVD with bypass grafts and stent DM2 HLD COPD Bradycardia  PHYSICAL EXAM: BP 122/78   Pulse (!) 53   Temp 98 F (36.7 C) (Oral)   Wt 121 lb (54.9 kg)   SpO2 97%   BMI 22.13 kg/m  GEN: NAD CV: bradycardia, regular rhythm, no murmurs, rubs, or gallops PULM: CTAB, normal effortaly SKIN: No rash or cyanosis; warm and well-perfused EXTR: No lower extremity edema or calf tenderness PSYCH: Mood and affect euthymic, normal rate and volume of speech NEURO: Awake, alert, no focal deficits grossly, normal speech   ASSESSMENT/PLAN:  Health maintenance:  - reports she received flu vaccine from outside  - Hep C today  epeat Blood Pressure at goal. Continue current regimen but will decrease Metoprolol to 25mg  BID due to bradycardia.  - CMP today   HYPERTENSION, BENIGN SYSTEMIC Repeat Blood Pressure at goal. Continue current regimen but will decrease Metoprolol to 25mg  BID due to bradycardia.  - CMP  Chest pain No current chest pain. Has significant risk factors for CAD. Referral made again to cardiology. Patient promises to go this time. Nitroglycerin given today. Declined EKG.  DM (diabetes mellitus), type 2 with peripheral vascular complications 123456 at goal. Continue Metformin .   HYPERCHOLESTEROLEMIA Controlled on Zocor therefore high intensity  statin is not indicated.  - repeat lipids today     FOLLOW UP: Follow up ASAP for well woman exam   Smiley Houseman, MD PGY [place]

## 2016-10-28 NOTE — Telephone Encounter (Signed)
I have already faxed these prescriptions to mail order pharmacy, and Page verified with pharmacy yesterday. Please let me know if there is anything else I need to do.

## 2016-10-29 ENCOUNTER — Encounter: Payer: Self-pay | Admitting: Internal Medicine

## 2016-10-29 ENCOUNTER — Ambulatory Visit (INDEPENDENT_AMBULATORY_CARE_PROVIDER_SITE_OTHER): Payer: Medicare HMO | Admitting: Internal Medicine

## 2016-10-29 VITALS — BP 122/78 | HR 53 | Temp 98.0°F | Wt 121.0 lb

## 2016-10-29 DIAGNOSIS — E7800 Pure hypercholesterolemia, unspecified: Secondary | ICD-10-CM

## 2016-10-29 DIAGNOSIS — E1151 Type 2 diabetes mellitus with diabetic peripheral angiopathy without gangrene: Secondary | ICD-10-CM | POA: Diagnosis not present

## 2016-10-29 DIAGNOSIS — I10 Essential (primary) hypertension: Secondary | ICD-10-CM | POA: Diagnosis not present

## 2016-10-29 DIAGNOSIS — R079 Chest pain, unspecified: Secondary | ICD-10-CM

## 2016-10-29 DIAGNOSIS — Z87898 Personal history of other specified conditions: Secondary | ICD-10-CM | POA: Diagnosis not present

## 2016-10-29 DIAGNOSIS — Z1159 Encounter for screening for other viral diseases: Secondary | ICD-10-CM | POA: Diagnosis not present

## 2016-10-29 LAB — POCT GLYCOSYLATED HEMOGLOBIN (HGB A1C): Hemoglobin A1C: 6.4

## 2016-10-29 MED ORDER — SIMVASTATIN 20 MG PO TABS: 20.0000 mg | ORAL_TABLET | Freq: Every day | ORAL | 0 refills | Status: DC

## 2016-10-29 MED ORDER — NITROGLYCERIN 0.4 MG SL SUBL: 0.4000 mg | SUBLINGUAL_TABLET | SUBLINGUAL | 0 refills | Status: DC | PRN

## 2016-10-29 MED ORDER — ATORVASTATIN CALCIUM 40 MG PO TABS: 40.0000 mg | ORAL_TABLET | Freq: Every day | ORAL | 3 refills | Status: DC

## 2016-10-29 MED ORDER — METOPROLOL TARTRATE 25 MG PO TABS: 25.0000 mg | ORAL_TABLET | Freq: Two times a day (BID) | ORAL | 0 refills | Status: DC

## 2016-10-29 NOTE — Assessment & Plan Note (Signed)
Repeat Blood Pressure at goal. Continue current regimen but will decrease Metoprolol to 25mg  BID due to bradycardia.

## 2016-10-29 NOTE — Patient Instructions (Addendum)
We decreased your dose of metoprolol.   Follow up in 1 month for your physical.   Blood work today.   We made a referral to cardiology. The clinic will call you to set up an appointment.

## 2016-10-29 NOTE — Assessment & Plan Note (Signed)
A1c at goal. Continue Metformin .

## 2016-10-29 NOTE — Assessment & Plan Note (Signed)
Controlled on Zocor therefore high intensity statin is not indicated.  - repeat lipids today

## 2016-10-29 NOTE — Assessment & Plan Note (Signed)
No current chest pain. Has significant risk factors for CAD. Referral made again to cardiology. Patient promises to go this time. Nitroglycerin given today. Declined EKG.

## 2016-10-30 LAB — LIPID PANEL
Cholesterol: 99 mg/dL — ABNORMAL LOW (ref 125–200)
HDL: 32 mg/dL — ABNORMAL LOW (ref >=46)
LDL Cholesterol: 31 mg/dL (ref <130)
Total CHOL/HDL Ratio: 3.1 Ratio (ref <=5.0)
Triglycerides: 181 mg/dL — ABNORMAL HIGH (ref <150)
VLDL: 36 mg/dL — ABNORMAL HIGH (ref <30)

## 2016-10-30 LAB — HEPATITIS C ANTIBODY: HCV Ab: NEGATIVE

## 2016-10-30 LAB — COMPREHENSIVE METABOLIC PANEL
ALT: 7 U/L (ref 6–29)
AST: 14 U/L (ref 10–35)
Albumin: 4.3 g/dL (ref 3.6–5.1)
Alkaline Phosphatase: 98 U/L (ref 33–130)
BUN: 11 mg/dL (ref 7–25)
CO2: 30 mmol/L (ref 20–31)
Calcium: 10.1 mg/dL (ref 8.6–10.4)
Chloride: 103 mmol/L (ref 98–110)
Creat: 1.2 mg/dL — ABNORMAL HIGH (ref 0.50–0.99)
Glucose, Bld: 90 mg/dL (ref 65–99)
Potassium: 4 mmol/L (ref 3.5–5.3)
Sodium: 143 mmol/L (ref 135–146)
Total Bilirubin: 0.4 mg/dL (ref 0.2–1.2)
Total Protein: 6.7 g/dL (ref 6.1–8.1)

## 2016-11-03 NOTE — Congregational Nurse Program (Signed)
Congregational Nurse Program Note  Date of Encounter: 10/03/2016  Past Medical History: Past Medical History:  Diagnosis Date  . Asthma   . CLL (chronic lymphoblastic leukemia)   . COPD (chronic obstructive pulmonary disease) ([place])   . Diabetes mellitus without complication ([place])   . GERD (gastroesophageal reflux disease)   . Hyperlipidemia   . Hypertension   . PVD (peripheral vascular disease) ([place])   . Stroke [facility])    in 1998 due to tumor     Encounter Details:  Client having problems with keeping her blood sugar under control.  Client does not have a glucometer.  Will try to find resource

## 2016-11-03 NOTE — Congregational Nurse Program (Signed)
Congregational Nurse Program Note  Date of Encounter: 10/03/2016  Past Medical History: Past Medical History:  Diagnosis Date  . Asthma   . CLL (chronic lymphoblastic leukemia)   . COPD (chronic obstructive pulmonary disease) ([place])   . Diabetes mellitus without complication ([place])   . GERD (gastroesophageal reflux disease)   . Hyperlipidemia   . Hypertension   . PVD (peripheral vascular disease) ([place])   . Stroke [facility])    in 1998 due to tumor     Encounter Details:   Found donor to get her a glucometer.  Client will buy glucometer with donation

## 2016-11-04 ENCOUNTER — Other Ambulatory Visit: Payer: Self-pay | Admitting: *Deleted

## 2016-11-04 NOTE — Telephone Encounter (Signed)
Refill request for 90 day supply to [facility] delivery pharmacy.  Reference number: [id number]. Please call [phone] with questions.  Derl Barrow, RN

## 2016-11-09 MED ORDER — CYCLOBENZAPRINE HCL 10 MG PO TABS: 10.0000 mg | ORAL_TABLET | Freq: Two times a day (BID) | ORAL | 1 refills | Status: DC | PRN

## 2016-11-09 MED ORDER — CLOPIDOGREL BISULFATE 75 MG PO TABS: 75.0000 mg | ORAL_TABLET | Freq: Every day | ORAL | 4 refills | Status: DC

## 2016-11-09 NOTE — Telephone Encounter (Signed)
Rx printed and placed on the to fax box as I am unable to send electronically to [facility]

## 2016-11-09 NOTE — Telephone Encounter (Signed)
Reprinted Rx to provide 90 day supply. Placed in to fax box.

## 2016-11-09 NOTE — Addendum Note (Signed)
Addended by: Smiley Houseman on: 11/09/2016 07:08 PM   Modules accepted: Orders

## 2016-11-15 ENCOUNTER — Telehealth: Payer: Self-pay | Admitting: *Deleted

## 2016-11-15 NOTE — Telephone Encounter (Signed)
Prior Authorization received from [employer] for Flexeril 10 mg tab.  PA form placed in provider box for completion. Derl Barrow, RN

## 2016-11-17 ENCOUNTER — Encounter: Payer: Self-pay | Admitting: *Deleted

## 2016-11-23 ENCOUNTER — Ambulatory Visit: Payer: Medicare HMO | Admitting: Cardiovascular Disease

## 2016-11-24 NOTE — Telephone Encounter (Signed)
PA form for Flexeril faxed to [facility] for review.  The review process could take up to 5 business days.  Derl Barrow, RN

## 2016-11-24 NOTE — Telephone Encounter (Signed)
Completed and placed in Ms [name] [name]'s box 

## 2016-11-25 NOTE — Telephone Encounter (Signed)
PA approved for cyclobenazprine via [employer].  Approval valid 12/26/2015-12/26/2017.  Derl Barrow, RN

## 2016-12-08 ENCOUNTER — Encounter: Payer: Self-pay | Admitting: Internal Medicine

## 2016-12-08 ENCOUNTER — Ambulatory Visit (INDEPENDENT_AMBULATORY_CARE_PROVIDER_SITE_OTHER): Payer: Medicare HMO | Admitting: Internal Medicine

## 2016-12-08 VITALS — BP 98/58 | HR 67 | Temp 98.1°F | Wt 124.0 lb

## 2016-12-08 DIAGNOSIS — E2839 Other primary ovarian failure: Secondary | ICD-10-CM | POA: Diagnosis not present

## 2016-12-08 DIAGNOSIS — Z0000 Encounter for general adult medical examination without abnormal findings: Secondary | ICD-10-CM

## 2016-12-08 DIAGNOSIS — I10 Essential (primary) hypertension: Secondary | ICD-10-CM | POA: Diagnosis not present

## 2016-12-08 MED ORDER — METFORMIN HCL 500 MG PO TABS: 500.0000 mg | ORAL_TABLET | Freq: Two times a day (BID) | ORAL | 0 refills | Status: DC

## 2016-12-08 MED ORDER — ENALAPRIL MALEATE 20 MG PO TABS: 20.0000 mg | ORAL_TABLET | Freq: Every day | ORAL | 0 refills | Status: DC

## 2016-12-08 MED ORDER — MONTELUKAST SODIUM 10 MG PO TABS: 10.0000 mg | ORAL_TABLET | Freq: Every day | ORAL | 0 refills | Status: DC

## 2016-12-08 NOTE — Patient Instructions (Addendum)
Please try to see if you can get records from [facility]: especially tetanus and pneumonia vaccine.  Call me to let me know when you will be able to get your colonoscopy done.   Please call the eye doctor today.   Please stop the hydrochlorothiazide. Take only one tablet of the enalapril a day. This is because your blood pressure was low today.

## 2016-12-08 NOTE — Assessment & Plan Note (Signed)
Repeat BP is still low at 95/57. Asymptomatic. Will discontinue HCTZ. Will decrease Enalapril from 20mg  BID to 20mg  daily. Continue Metoprolol 50mg  BID for now.  Follow up in 1 week or sooner if patient finds that her blood pressure is high.

## 2016-12-08 NOTE — Progress Notes (Signed)
   [facility] Phone: [phone]   Date of Visit: 12/08/2016   HPI:  Patient presents today for a well woman exam.   Concerns today: none Periods: post-menopausal Contraception: none Pelvic symptoms: denies abnormal vaginal discharge, vaginal bleeding  Sexual activity: abstinent  STD Screening: declines  Pap smear status: thinks she had one in [id number] but not certain; unsure if she had one done in [place] at her prior PCP. Per chart review, last pap on file was in 2009. Denies history of abnormal paps.   Exercise: walks twice a day  Diet: watches what she eats. Rarely eats fried food  Smoking: 3 cigarettes a day  Alcohol: denies Drugs: denies  Advance directives: has not discussed prior to this. Interested in getting information.  Mood: PHQ 2 negative  Dentist: has not gone yet.   Hypertension:  - her blood pressure is low today. No recent illness - denies dizziness or lightheadedness currently but reports she has had some episodes of dizziness - takes HCTZ 25mg  daily, Enalapril 20mg  BID, Metoprolol 50mg  BID (although this was changed to 25mg  BID at last visit)   ROS: See HPI. ROS negative except for headaches which are chronic and not worsening.   [place]:  Cancers in family: uptodate in chart  PHYSICAL EXAM: BP (!) 98/58   Pulse 67   Temp 98.1 F (36.7 C) (Oral)   Wt 124 lb (56.2 kg)   SpO2 93%   BMI 22.68 kg/m  Gen: NAD, pleasant, cooperative HEENT: NCAT, PERRL, no palpable thyromegaly or anterior cervical lymphadenopathy Heart: RRR, no murmurs Lungs: CTAB, NWOB Abdomen: soft, nontender to palpation Neuro: grossly nonfocal, speech normal GU: normal appearing external genitalia without lesions. Vagina is moist with white discharge. Cervix normal in appearance. No cervical motion tenderness or tenderness on bimanual exam. No adnexal masses.    ASSESSMENT/PLAN:  # Health maintenance:  -STD screening: declines -pap smear: note  above. We do not have three consecutive pap smears to show that patient was adequately screened. Patient wants to wait and see if she can get in touch with her prior PCP; declined PAP today -mammogram: provided contact information -lipid screening: up to date -DEXA: ordered today  -immunizations: Patient unsure if she had PNA and tetanus done in [place] two years ago. She would like to touch base with her PCP first -advance directives: given information  - due for colonoscopy, however due to transportation issues, patient asks to wait to place a referral. She will call me to let me know when she can go.  - Due to opthalmology appointment: patient to call today.    Smiley Houseman, MD PGY [place]

## 2016-12-14 NOTE — Progress Notes (Signed)
   [facility] Phone: [phone]   Date of Visit: 12/15/2016   HPI:  HTN:  - had low BP at last visit (12/08/16) - we discontinued HCTZ and decreased Enalapril from 20mg  BID to daily. Continue Metoprolol 50mg  BID (for now as patient was taking this dose even though it was decreaesd to 25mg  BID)  - [facility] "good"  - patient reports that she did not take her medications this morning (including BP meds)  - denies chest pain or blurred vision   ROS: See HPI.  [place]:  PMH:  HTN PVD with bypass grafts and stent DM2 HLD COPD Bradycardia  PHYSICAL EXAM: BP (!) 158/78 (BP Location: Right Arm, Patient Position: Sitting, Cuff Size: Small)   Pulse 60   Temp 98 F (36.7 C) (Oral)   Wt 132 lb (59.9 kg)   SpO2 96%   BMI 24.14 kg/m  On arrival: 162/78 (patient reports this was right arm).  On my measurement with manual cuff: Left 100/75 and Right 155/80 GEN: NAD CV: RRR, no murmurs, rubs, or gallops PULM: CTAB, normal effort SKIN: No rash or cyanosis; warm and well-perfused EXTR: No lower extremity edema or calf tenderness PSYCH: Mood and affect euthymic, normal rate and volume of speech NEURO: Awake, alert, no focal deficits grossly, normal speech  ASSESSMENT/PLAN:  HYPERTENSION, BENIGN SYSTEMIC On my measurement of BP, there was a discrepancy between the left and right, with the lowest on the left at 100/75. Since patient did not take any of her medications today, will not make changes to Enalapril. Due to HR being 60 without taking her medications, will decrease Metoprolol to 25mg  BID.  - patient to come in for nurse visit 12/21 after taking medications to get BP check on both arms  - if discrepancy is still present, will need to consider evaluation for thoracic outlet syndrome.  Discussed with preceptor    Smiley Houseman, MD PGY [place]

## 2016-12-15 ENCOUNTER — Encounter: Payer: Self-pay | Admitting: Internal Medicine

## 2016-12-15 ENCOUNTER — Ambulatory Visit (INDEPENDENT_AMBULATORY_CARE_PROVIDER_SITE_OTHER): Payer: Medicare HMO | Admitting: Internal Medicine

## 2016-12-15 DIAGNOSIS — R001 Bradycardia, unspecified: Secondary | ICD-10-CM

## 2016-12-15 DIAGNOSIS — I10 Essential (primary) hypertension: Secondary | ICD-10-CM

## 2016-12-15 MED ORDER — METOPROLOL TARTRATE 25 MG PO TABS: 25.0000 mg | ORAL_TABLET | Freq: Two times a day (BID) | ORAL | 0 refills | Status: DC

## 2016-12-15 NOTE — Patient Instructions (Signed)
Start taking Metoprolol 25mg  twice a day (instead of your old pills).  Make a nurse visit tomorrow to get your blood pressure checked. Please take your blood pressure medications that day before coming.

## 2016-12-16 ENCOUNTER — Ambulatory Visit (INDEPENDENT_AMBULATORY_CARE_PROVIDER_SITE_OTHER): Payer: Medicare HMO | Admitting: *Deleted

## 2016-12-16 VITALS — BP 160/84 | HR 59

## 2016-12-16 DIAGNOSIS — Z0130 Encounter for examination of blood pressure without abnormal findings: Secondary | ICD-10-CM

## 2016-12-16 DIAGNOSIS — I10 Essential (primary) hypertension: Secondary | ICD-10-CM

## 2016-12-16 NOTE — Assessment & Plan Note (Signed)
On my measurement of BP, there was a discrepancy between the left and right, with the lowest on the left at 100/75. Since patient did not take any of her medications today, will not make changes to Enalapril. Due to HR being 60 without taking her medications, will decrease Metoprolol to 25mg  BID.  - patient to come in for nurse visit 12/21 after taking medications to get BP check on both arms  - if discrepancy is still present, will need to consider evaluation for thoracic outlet syndrome.  Discussed with preceptor

## 2016-12-16 NOTE — Progress Notes (Signed)
   Patient presents for BP check after taking AM doses of enalapril 20 mg and metoprolol 25 mg. Took meds approx 1 hour ago. Last cig 2 hours ago. Discussed need for low sodium diet and using Mrs. Dash as alternative to salt.Encouraged to choose foods with 5% or less of daily value for sodium. Patient currently walking 30 minutes per day 3 days per week for exercise. Encouraged to increase to 5 days per week Patient endorses occipital headaches over last 2 years following brain surgery. Denies at present. States need for eye exam and new prescription lenses. Denies SHOB, chest pain   BP 180/90 left arm manually with adut cuff P 57 SpO2 98% BP 172/80 right arm manually with adult cuff P 60 SpO2 99%  Patient had started taking enalapril 25 mg daily yesterday. Per Preceptor Dr. Gwendlyn Deutscher, patient should take enalapril 20 mg bid and metoprolol 25 mg bid. Patient made aware and states understanding.  Advised patient to schedule BP check in nurse clinic tomorrow, however, patient states, "that would be a big problem." Patient relies on husband for transportation and states he will not want to bring her again tomorrow. Patient's home BP monitoring device is broken. Discussed obtaining BP at local pharmacy and calling office with results. States she has a friend who is an Therapist, sports and comes by to check BP. Will have him come by tomorrow around 2 pm and will call office with results.  BP recheck  168/80  left arm manually P 58 SpO2 98% 160/84 right arm manually P 59 SpO2 99%  Patient advised to call for med refills at least 7 days before running out so as not to go without.  Patient given literature on DASH Eating Plan and Smoking Cessation. Patient states desire to quit. Also given info on 1-800-QUIT-NOW. Hubbard Hartshorn, RN, BSN

## 2016-12-16 NOTE — Assessment & Plan Note (Signed)
Decrease beta blocker to  Metoprolol 25mg  BID

## 2016-12-16 NOTE — Patient Instructions (Signed)
Steps to Quit Smoking Smoking tobacco can be bad for your health. It can also affect almost every organ in your body. Smoking puts you and people around you at risk for many serious long-lasting (chronic) diseases. Quitting smoking is hard, but it is one of the best things that you can do for your health. It is never too late to quit. What are the benefits of quitting smoking? When you quit smoking, you lower your risk for getting serious diseases and conditions. They can include:  Lung cancer or lung disease.  Heart disease.  Stroke.  Heart attack.  Not being able to have children (infertility).  Weak bones (osteoporosis) and broken bones (fractures). If you have coughing, wheezing, and shortness of breath, those symptoms may get better when you quit. You may also get sick less often. If you are pregnant, quitting smoking can help to lower your chances of having a baby of low birth weight. What can I do to help me quit smoking? Talk with your doctor about what can help you quit smoking. Some things you can do (strategies) include:  Quitting smoking totally, instead of slowly cutting back how much you smoke over a period of time.  Going to in-person counseling. You are more likely to quit if you go to many counseling sessions.  Using resources and support systems, such as:  Online chats with a counselor.  Phone quitlines.  Printed self-help materials.  Support groups or group counseling.  Text messaging programs.  Mobile phone apps or applications.  Taking medicines. Some of these medicines may have nicotine in them. If you are pregnant or breastfeeding, do not take any medicines to quit smoking unless your doctor says it is okay. Talk with your doctor about counseling or other things that can help you. Talk with your doctor about using more than one strategy at the same time, such as taking medicines while you are also going to in-person counseling. This can help make quitting  easier. What things can I do to make it easier to quit? Quitting smoking might feel very hard at first, but there is a lot that you can do to make it easier. Take these steps:  Talk to your family and friends. Ask them to support and encourage you.  Call phone quitlines, reach out to support groups, or work with a counselor.  Ask people who smoke to not smoke around you.  Avoid places that make you want (trigger) to smoke, such as:  Bars.  Parties.  Smoke-break areas at work.  Spend time with people who do not smoke.  Lower the stress in your life. Stress can make you want to smoke. Try these things to help your stress:  Getting regular exercise.  Deep-breathing exercises.  Yoga.  Meditating.  Doing a body scan. To do this, close your eyes, focus on one area of your body at a time from head to toe, and notice which parts of your body are tense. Try to relax the muscles in those areas.  Download or buy apps on your mobile phone or tablet that can help you stick to your quit plan. There are many free apps, such as QuitGuide from the CDC (Centers for Disease Control and Prevention). You can find more support from smokefree.gov and other websites. This information is not intended to replace advice given to you by your health care provider. Make sure you discuss any questions you have with your health care provider. Document Released: 10/09/2009 Document Revised: 08/10/2016 Document   Reviewed: 04/29/2015 Elsevier Interactive Patient Education  2017 [place] DASH stands for "Dietary Approaches to Stop Hypertension." The DASH eating plan is a healthy eating plan that has been shown to reduce high blood pressure (hypertension). Additional health benefits may include reducing the risk of type 2 diabetes mellitus, heart disease, and stroke. The DASH eating plan may also help with weight loss. What do I need to know about the DASH eating plan? For the DASH eating  plan, you will follow these general guidelines:  Choose foods with less than 150 milligrams of sodium per serving (as listed on the food label).  Use salt-free seasonings or herbs instead of table salt or sea salt.  Check with your health care provider or pharmacist before using salt substitutes.  Eat lower-sodium products. These are often labeled as "low-sodium" or "no salt added."  Eat fresh foods. Avoid eating a lot of canned foods.  Eat more vegetables, fruits, and low-fat dairy products.  Choose whole grains. Look for the word "whole" as the first word in the ingredient list.  Choose fish and skinless chicken or [place] more often than red meat. Limit fish, poultry, and meat to 6 oz (170 g) each day.  Limit sweets, desserts, sugars, and sugary drinks.  Choose heart-healthy fats.  Eat more home-cooked food and less restaurant, buffet, and fast food.  Limit fried foods.  Do not fry foods. Cook foods using methods such as baking, boiling, grilling, and broiling instead.  When eating at a restaurant, ask that your food be prepared with less salt, or no salt if possible. What foods can I eat? Seek help from a dietitian for individual calorie needs. Grains  Whole grain or whole wheat bread. Brown rice. Whole grain or whole wheat pasta. Quinoa, bulgur, and whole grain cereals. Low-sodium cereals. Corn or whole wheat flour tortillas. Whole grain cornbread. Whole grain crackers. Low-sodium crackers. Vegetables  Fresh or frozen vegetables (raw, steamed, roasted, or grilled). Low-sodium or reduced-sodium tomato and vegetable juices. Low-sodium or reduced-sodium tomato sauce and paste. Low-sodium or reduced-sodium canned vegetables. Fruits  All fresh, canned (in natural juice), or frozen fruits. Meat and Other Protein Products  Ground beef (85% or leaner), grass-fed beef, or beef trimmed of fat. Skinless chicken or [place]. Ground chicken or [place]. Pork trimmed of fat. All fish and  seafood. Eggs. Dried beans, peas, or lentils. Unsalted nuts and seeds. Unsalted canned beans. Dairy  Low-fat dairy products, such as skim or 1% milk, 2% or reduced-fat cheeses, low-fat ricotta or cottage cheese, or plain low-fat yogurt. Low-sodium or reduced-sodium cheeses. Fats and Oils  Tub margarines without trans fats. Light or reduced-fat mayonnaise and salad dressings (reduced sodium). Avocado. Safflower, olive, or canola oils. Natural peanut or almond butter. Other  Unsalted popcorn and pretzels. The items listed above may not be a complete list of recommended foods or beverages. Contact your dietitian for more options.  What foods are not recommended? Grains  White bread. White pasta. White rice. Refined cornbread. Bagels and croissants. Crackers that contain trans fat. Vegetables  Creamed or fried vegetables. Vegetables in a cheese sauce. Regular canned vegetables. Regular canned tomato sauce and paste. Regular tomato and vegetable juices. Fruits  Canned fruit in light or heavy syrup. Fruit juice. Meat and Other Protein Products  Fatty cuts of meat. Ribs, chicken wings, bacon, sausage, bologna, salami, chitterlings, fatback, hot dogs, bratwurst, and packaged luncheon meats. Salted nuts and seeds. Canned beans with salt. Dairy  Whole or 2% milk,  cream, half-and-half, and cream cheese. Whole-fat or sweetened yogurt. Full-fat cheeses or blue cheese. Nondairy creamers and whipped toppings. Processed cheese, cheese spreads, or cheese curds. Condiments  Onion and garlic salt, seasoned salt, table salt, and sea salt. Canned and packaged gravies. Worcestershire sauce. Tartar sauce. Barbecue sauce. Teriyaki sauce. Soy sauce, including reduced sodium. Steak sauce. Fish sauce. Oyster sauce. Cocktail sauce. Horseradish. Ketchup and mustard. Meat flavorings and tenderizers. Bouillon cubes. Hot sauce. Tabasco sauce. Marinades. Taco seasonings. Relishes. Fats and Oils  Butter, stick margarine, lard,  shortening, ghee, and bacon fat. Coconut, palm kernel, or palm oils. Regular salad dressings. Other  Pickles and olives. Salted popcorn and pretzels. The items listed above may not be a complete list of foods and beverages to avoid. Contact your dietitian for more information.  Where can I find more information? National Heart, Lung, and Blood Institute: travelstabloid.com This information is not intended to replace advice given to you by your health care provider. Make sure you discuss any questions you have with your health care provider. Document Released: 12/02/2011 Document Revised: 05/20/2016 Document Reviewed: 10/17/2013 Elsevier Interactive Patient Education  2017 [employer].

## 2016-12-17 ENCOUNTER — Other Ambulatory Visit: Payer: Self-pay | Admitting: *Deleted

## 2016-12-17 NOTE — Telephone Encounter (Signed)
Patient called in to report home BPs taken by nurse friend. BP 170/81 left arm P 75 (irregular) BP 169/81 right arm States she's feeling "Okay."  Patient reminded of appt with cardiologist Minus Breeding) next week 12/22/2016 at 1000. Patient states she will be going to that appt.   Patient instructed to head directly to ED if develops chest pain, SHOB, severe headache or visual changes. Patient states she will. Note routed to PCP.   Hubbard Hartshorn, RN, BSN

## 2016-12-21 NOTE — Progress Notes (Addendum)
Cardiology Office Note   Date:  12/22/2016   ID:  SAKINAH GUSTAVESON, DOB 1946/01/14, MRN [id number]  PCP:  Smiley Houseman, MD  Cardiologist:   Minus Breeding, MD  Referring:  Smiley Houseman, MD  Chief Complaint  Patient presents with  . Chest Pain      History of Present Illness: [name] [name] is a 70 y.o. female who presents for evaluation of chest pain. She described this to her primary provider. With me she somewhat vague about symptoms. She says this has been going on for some time. Seems to happen with emotional stress. She cannot quantify or qualify it. She describes it more as a tightness. She is limited by claudication and she's had peripheral vascular disease with stenting. I don't have all of these records. She was seen by a cardiologist here but then moved to [place] in his back. She is new to our practice. She does get leg pain when she walks. She might get dyspneic walking up a hill. She doesn't however bring on the chest discomfort with this mild activity.  She does not describe palpitations, presyncope or syncope. She does not have PND or orthopnea.  She doesn't recall any prior catheterization stress tests other than one was attempted some time ago that she apparently was not able to complete.  I do note that in July of this year she had an echocardiogram. There was some mild aortic valve regurgitation. She had well-preserved left ventricular function.  Past Medical History:  Diagnosis Date  . Asthma   . CLL (chronic lymphoblastic leukemia)   . COPD (chronic obstructive pulmonary disease) ([place])   . Diabetes mellitus without complication ([place])   . GERD (gastroesophageal reflux disease)   . Hyperlipidemia   . Hypertension   . PVD (peripheral vascular disease) ([place])   . Stroke [facility])    in 1998 due to tumor     Past Surgical History:  Procedure Laterality Date  . ABI  02/2012   ABI <0.65 BL 02/2012  . BRAIN MENINGIOMA EXCISION    . Bypass grafting  of RLE for PAD claudication     . CESAREAN SECTION     1974, 77 ,79  . CHOLECYSTECTOMY, LAPAROSCOPIC    . ESOPHAGEAL DILATION       Current Outpatient Prescriptions  Medication Sig Dispense Refill  . albuterol (PROVENTIL HFA;VENTOLIN HFA) 108 (90 Base) MCG/ACT inhaler Inhale 2 puffs into the lungs every 4 (four) hours as needed for wheezing or shortness of breath. 1 Inhaler 5  . cilostazol (PLETAL) 50 MG tablet Take 1 tablet (50 mg total) by mouth 2 (two) times daily. 180 tablet 0  . clopidogrel (PLAVIX) 75 MG tablet Take 1 tablet (75 mg total) by mouth daily. 90 tablet 4  . cyclobenzaprine (FLEXERIL) 10 MG tablet Take 1 tablet (10 mg total) by mouth 2 (two) times daily as needed for muscle spasms. 60 tablet 1  . enalapril (VASOTEC) 20 MG tablet Take 1 tablet (20 mg total) by mouth daily. 180 tablet 0  . famotidine (PEPCID) 20 MG tablet Take 1 tablet (20 mg total) by mouth daily as needed for heartburn or indigestion. 90 tablet 0  . Fluticasone-Salmeterol (ADVAIR DISKUS) 250-50 MCG/DOSE AEPB Inhale 1 puff into the lungs 2 (two) times daily as needed (For asthma.). 1 each 2  . loratadine (CLARITIN) 10 MG tablet Take 1 tablet (10 mg total) by mouth daily. 30 tablet 2  . metFORMIN (GLUCOPHAGE) 500 MG tablet  Take 1 tablet (500 mg total) by mouth 2 (two) times daily with a meal. 180 tablet 0  . metoprolol tartrate (LOPRESSOR) 25 MG tablet Take 1 tablet (25 mg total) by mouth 2 (two) times daily. 60 tablet 0  . montelukast (SINGULAIR) 10 MG tablet Take 1 tablet (10 mg total) by mouth at bedtime. 90 tablet 0  . nitroGLYCERIN (NITROSTAT) 0.4 MG SL tablet Place 1 tablet (0.4 mg total) under the tongue every 5 (five) minutes as needed for chest pain. Only take up to 3 tablets. 6 tablet 0  . simvastatin (ZOCOR) 20 MG tablet Take 1 tablet (20 mg total) by mouth at bedtime. 90 tablet 0  . traMADol (ULTRAM) 50 MG tablet Take 1 tablet (50 mg total) by mouth every 12 (twelve) hours as needed (For pain.).  Reported on 01/01/2016 20 tablet 0   No current facility-administered medications for this visit.     Allergies:   Aspirin and Baclofen    Social History:  The patient  reports that she has been smoking Cigarettes.  She started smoking about 41 years ago. She has a 4.50 pack-year smoking history. She has never used smokeless tobacco. She reports that she does not drink alcohol or use drugs.   Family History:  The patient's family history includes Asthma in her brother and mother; Cancer in her brother, maternal aunt, and mother; Depression in her mother, sister, and sister; Diabetes in her brother, sister, and sister; HIV/AIDS in her brother and sister; Heart attack in her maternal grandmother and sister; Heart attack (age of onset: 46) in her mother; Heart disease in her maternal grandmother and mother; Hyperlipidemia in her mother, sister, and sister; Hypertension in her brother and mother; Kidney disease in her mother; Stroke in her mother and sister.    ROS:  Please see the history of present illness.   Otherwise, review of systems are positive for occasionally off balance.   All other systems are reviewed and negative.    PHYSICAL EXAM: VS:  BP (!) 160/88 Comment: Left arm.  Pulse (!) 52   Ht 5\' 2"  (1.575 m)   Wt 130 lb (59 kg)   BMI 23.78 kg/m  , BMI Body mass index is 23.78 kg/m. GENERAL:  Well appearing HEENT:  Pupils equal round and reactive, fundi not visualized, oral mucosa unremarkable NECK:  No jugular venous distention, waveform within normal limits, carotid upstroke brisk and symmetric, no bruits, no thyromegaly LYMPHATICS:  No cervical, inguinal adenopathy LUNGS:  Clear to auscultation bilaterally BACK:  No CVA tenderness CHEST:  Unremarkable HEART:  PMI not displaced or sustained,S1 and S2 within normal limits, no S3, no S4, no clicks, no rubs, no murmurs ABD:  Flat, positive bowel sounds normal in frequency in pitch, no bruits, no rebound, no guarding, no midline  pulsatile mass, no hepatomegaly, no splenomegaly EXT:  2 plus pulses upper, 2+ dorsalis pedis on the left with absent posterior tibialis, absent dorsalis pedis and posterior tibialis on the right with 1+ right popliteal, no edema, no cyanosis no clubbing SKIN:  No rashes no nodules NEURO:  Cranial nerves II through XII grossly intact, motor grossly intact throughout PSYCH:  Cognitively intact, oriented to person place and time    EKG:  EKG is ordered today. The ekg ordered today demonstrates sinus bradycardia, rate 52, sinus arrhythmia, axis within normal limits, intervals within normal limits, poor anterior R wave progression.   Recent Labs: 07/01/2016: TSH 2.47 10/29/2016: ALT 7; BUN 11; Creat 1.20; Potassium  4.0; Sodium 143    Lipid Panel    Component Value Date/Time   CHOL 99 (L) 10/29/2016 1022   TRIG 181 (H) 10/29/2016 1022   HDL 32 (L) 10/29/2016 1022   CHOLHDL 3.1 10/29/2016 1022   VLDL 36 (H) 10/29/2016 1022   LDLCALC 31 10/29/2016 1022   LDLDIRECT 62 09/24/2010 2055      Wt Readings from Last 3 Encounters:  12/22/16 130 lb (59 kg)  12/15/16 132 lb (59.9 kg)  12/08/16 124 lb (56.2 kg)      Other studies Reviewed: Additional studies/ records that were reviewed today include:  Primary care office records. Review of the above records demonstrates:  Please see elsewhere in the note.     ASSESSMENT AND PLAN:  CHEST PAIN:  This is atypical. However, she does have significant cardiovascular risk factors. Stress test but she would not be able to walk on a treadmill. I will bring the patient back for a Lexiscan Myoview.. This will allow me to screen for obstructive coronary disease.    TOBACCO ABUSE:  I discussed the need to stop smoking.  She thinks that she will try on Jan 1st.   PVD:  She has some pain but no resting pain or foot wounds.  We discussed the need to stop smoking.  HTN:  She is going to get a BP cuff for home. (She has one that likely doesn't work and is  a wrist cuff that we don't recommend.)  She will keep a BP diary and present this to her PCP.  Further management will be based on these results.    Current medicines are reviewed at length with the patient today.  The patient does not have concerns regarding medicines.  The following changes have been made:  no change  Labs/ tests ordered today include:   Orders Placed This Encounter  Procedures  . Myocardial Perfusion Imaging  . EKG 12-Lead     Disposition:   FU with me as needed.      Signed, Minus Breeding, MD  12/22/2016 10:32 AM    [facility]

## 2016-12-22 ENCOUNTER — Ambulatory Visit (INDEPENDENT_AMBULATORY_CARE_PROVIDER_SITE_OTHER): Payer: Medicare HMO | Admitting: Cardiology

## 2016-12-22 ENCOUNTER — Encounter: Payer: Self-pay | Admitting: Cardiology

## 2016-12-22 VITALS — BP 160/88 | HR 52 | Ht 62.0 in | Wt 130.0 lb

## 2016-12-22 DIAGNOSIS — R079 Chest pain, unspecified: Secondary | ICD-10-CM

## 2016-12-22 DIAGNOSIS — I10 Essential (primary) hypertension: Secondary | ICD-10-CM

## 2016-12-22 DIAGNOSIS — I739 Peripheral vascular disease, unspecified: Secondary | ICD-10-CM

## 2016-12-22 MED ORDER — NITROGLYCERIN 0.4 MG SL SUBL: 0.4000 mg | SUBLINGUAL_TABLET | SUBLINGUAL | 0 refills | Status: DC | PRN

## 2016-12-22 NOTE — Patient Instructions (Addendum)
Medication Instructions:  Continue current medications  Labwork: None Ordered  Testing/Procedures: Your physician has requested that you have a lexiscan myoview. For further information please visit [email or web address]. Please follow instruction sheet, as given.  Follow-Up: Your physician recommends that you schedule a follow-up appointment in: As Needed   Any Other Special Instructions Will Be Listed Below (If Applicable).       [place]  Your Husband must take you bowling   If you need a refill on your cardiac medications before your next appointment, please call your pharmacy.

## 2016-12-23 ENCOUNTER — Telehealth: Payer: Self-pay | Admitting: Internal Medicine

## 2016-12-23 MED ORDER — HYDROCHLOROTHIAZIDE 12.5 MG PO CAPS: 12.5000 mg | ORAL_CAPSULE | Freq: Every day | ORAL | 0 refills | Status: DC

## 2016-12-23 NOTE — Telephone Encounter (Signed)
Patient called late last week with BP measurements (please refer to phone note). Additionally, she was seen by cardiology where her BP was 160/88.   On 12/13 at clinic visit, her blood pressure was low and therefore HCTZ was discontinued and Enalapril dose was decreased. Since her BP has been persistently elevated since then, will restart HCTZ at 12.5 mg daily. Called patient to discuss. She is to make a follow up appointment in 2 weeks for BP.

## 2016-12-24 ENCOUNTER — Telehealth (HOSPITAL_COMMUNITY): Payer: Self-pay

## 2016-12-24 NOTE — Telephone Encounter (Signed)
Encounter complete. 

## 2016-12-29 ENCOUNTER — Ambulatory Visit (HOSPITAL_COMMUNITY)
Admission: RE | Admit: 2016-12-29 | Discharge: 2016-12-29 | Disposition: A | Discharge status: Home / Self Care | Payer: Medicare HMO | Source: Ambulatory Visit | Attending: Cardiology | Admitting: Cardiology

## 2016-12-29 DIAGNOSIS — R079 Chest pain, unspecified: Secondary | ICD-10-CM | POA: Diagnosis not present

## 2016-12-29 LAB — MYOCARDIAL PERFUSION IMAGING
LV dias vol: 72 mL (ref 46–106)
LV sys vol: 28 mL
Peak HR: 93 {beats}/min
Rest HR: 57 {beats}/min
SDS: 0
SRS: 1
SSS: 1
TID: 1.07

## 2016-12-29 MED ORDER — REGADENOSON 0.4 MG/5ML IV SOLN
0.4000 mg | Freq: Once | INTRAVENOUS | Status: AC
  Administered 2016-12-29: 0.4 mg via INTRAVENOUS

## 2016-12-29 MED ORDER — AMINOPHYLLINE 25 MG/ML IV SOLN
75.0000 mg | Freq: Once | INTRAVENOUS | Status: AC
  Administered 2016-12-29: 75 mg via INTRAVENOUS

## 2016-12-29 MED ORDER — TECHNETIUM TC 99M TETROFOSMIN IV KIT
10.9000 | PACK | Freq: Once | INTRAVENOUS | Status: AC | PRN
  Administered 2016-12-29: 10.9 via INTRAVENOUS
  Filled 2016-12-29: qty 11

## 2016-12-29 MED ORDER — TECHNETIUM TC 99M TETROFOSMIN IV KIT
32.1000 | PACK | Freq: Once | INTRAVENOUS | Status: AC | PRN
  Administered 2016-12-29: 32.1 via INTRAVENOUS
  Filled 2016-12-29: qty 33

## 2017-02-23 ENCOUNTER — Other Ambulatory Visit: Payer: Self-pay | Admitting: *Deleted

## 2017-02-24 NOTE — Telephone Encounter (Signed)
Pt was returning your call. Please call back. ep

## 2017-02-24 NOTE — Telephone Encounter (Signed)
Attempted to call to discuss medication refill. Per chart review, patient only receive 30 days worth of Metoprolol 25mg  BID in December. I wanted to discuss this with her prior to approving her refills.

## 2017-02-24 NOTE — Telephone Encounter (Signed)
Will forward to MD. Sharmaine Bain,CMA  

## 2017-02-25 MED ORDER — FLUTICASONE-SALMETEROL 250-50 MCG/DOSE IN AEPB: 1.0000 | INHALATION_SPRAY | Freq: Two times a day (BID) | RESPIRATORY_TRACT | 2 refills | Status: DC

## 2017-02-25 MED ORDER — ENALAPRIL MALEATE 20 MG PO TABS: 20.0000 mg | ORAL_TABLET | Freq: Every day | ORAL | 0 refills | Status: DC

## 2017-02-25 MED ORDER — LORATADINE 10 MG PO TABS: 10.0000 mg | ORAL_TABLET | Freq: Every day | ORAL | 2 refills | Status: DC

## 2017-02-25 MED ORDER — METFORMIN HCL 500 MG PO TABS: 500.0000 mg | ORAL_TABLET | Freq: Two times a day (BID) | ORAL | 0 refills | Status: DC

## 2017-02-25 MED ORDER — MONTELUKAST SODIUM 10 MG PO TABS: 10.0000 mg | ORAL_TABLET | Freq: Every day | ORAL | 0 refills | Status: DC

## 2017-02-25 MED ORDER — FAMOTIDINE 20 MG PO TABS: 20.0000 mg | ORAL_TABLET | Freq: Every day | ORAL | 0 refills | Status: DC

## 2017-02-25 MED ORDER — METOPROLOL TARTRATE 25 MG PO TABS: 25.0000 mg | ORAL_TABLET | Freq: Two times a day (BID) | ORAL | 0 refills | Status: DC

## 2017-02-25 MED ORDER — CILOSTAZOL 50 MG PO TABS: 50.0000 mg | ORAL_TABLET | Freq: Two times a day (BID) | ORAL | 0 refills | Status: DC

## 2017-02-25 MED ORDER — SIMVASTATIN 20 MG PO TABS: 20.0000 mg | ORAL_TABLET | Freq: Every day | ORAL | 0 refills | Status: DC

## 2017-02-25 MED ORDER — CLOPIDOGREL BISULFATE 75 MG PO TABS: 75.0000 mg | ORAL_TABLET | Freq: Every day | ORAL | 4 refills | Status: DC

## 2017-02-25 MED ORDER — HYDROCHLOROTHIAZIDE 12.5 MG PO CAPS: 12.5000 mg | ORAL_CAPSULE | Freq: Every day | ORAL | 0 refills | Status: DC

## 2017-02-25 NOTE — Telephone Encounter (Signed)
Called patient. Discussed medications and then sent rx to pharmacy. Patient to make a follow up appointment this month.

## 2017-04-05 ENCOUNTER — Encounter: Payer: Self-pay | Admitting: Internal Medicine

## 2017-04-05 ENCOUNTER — Ambulatory Visit (INDEPENDENT_AMBULATORY_CARE_PROVIDER_SITE_OTHER): Payer: Medicare HMO | Admitting: Internal Medicine

## 2017-04-05 VITALS — BP 130/67 | HR 67 | Temp 97.9°F | Ht 62.0 in | Wt 130.4 lb

## 2017-04-05 DIAGNOSIS — R001 Bradycardia, unspecified: Secondary | ICD-10-CM | POA: Diagnosis not present

## 2017-04-05 DIAGNOSIS — C9110 Chronic lymphocytic leukemia of B-cell type not having achieved remission: Secondary | ICD-10-CM | POA: Diagnosis not present

## 2017-04-05 DIAGNOSIS — M549 Dorsalgia, unspecified: Secondary | ICD-10-CM

## 2017-04-05 DIAGNOSIS — I10 Essential (primary) hypertension: Secondary | ICD-10-CM

## 2017-04-05 DIAGNOSIS — G8929 Other chronic pain: Secondary | ICD-10-CM | POA: Diagnosis not present

## 2017-04-05 MED ORDER — TRAMADOL HCL 50 MG PO TABS: 50.0000 mg | ORAL_TABLET | Freq: Every day | ORAL | 0 refills | Status: DC | PRN

## 2017-04-05 MED ORDER — CYCLOBENZAPRINE HCL 10 MG PO TABS: 10.0000 mg | ORAL_TABLET | Freq: Every day | ORAL | 0 refills | Status: DC | PRN

## 2017-04-05 NOTE — Patient Instructions (Signed)
I made a referral to oncology.  Please see if you can get your records in the next month

## 2017-04-05 NOTE — Assessment & Plan Note (Signed)
Resolved with current dose of Metoprolol.

## 2017-04-05 NOTE — Assessment & Plan Note (Addendum)
Stable. Refilled Tramadol and Flexeril.

## 2017-04-05 NOTE — Progress Notes (Signed)
   [facility] Phone: [phone]   Date of Visit: 04/05/2017   HPI: Patient presents for follow up for HTN, Bradycardia, Chronic Back pain.   Chronic Back Pain, Stable and slightly improved:  - low back pain that is stable to slightly improved  - usually controlled with 1 Flexeril and 1 tramadol per day  - denies any lower extremity weakness or saddle anesthesia - MRI lumbar spine on 11/2015 with bulging discs and shallow central disc protrusions at L4-5 and L5-S1. Mild bilateral recess stenosis at L5-S1.  - has tried Tylenol and NSAIDs in the past without improvement.   HTN/Bradycardia:  - taking Enalapril 20mg  daily, Metoprolol 25mg  BID, HCTZ 12.5mg  daily. Compliant with medications - reports lightheadedness and dizziness have improved since with decreased dose of metoprolol for bradycardia. Reports she has some episodes still when changing from a sitting to standing position. No falls.   We discussed that she is due for many of her health maintenance tests. She has wanted to wait until she is able to get records from her prior PCP in [place]. The clinic and the patient has had trouble getting in contact with the PCP's office. She would like to wait one more month and try to go down to [facility] to see if she will be able to obtain records. We decided to wait one more month. Additionally, patient has history of CLL and does not recall being seen by Oncologist. We initially made a referral when she first established but she was unable to make this appointment. She is agreeable for me to make a referral today. Referral made.   ROS: See HPI.  [place]:  PMH:  HTN PVD with bypass grafts and stent DM2 HLD COPD Bradycardia CLL  PHYSICAL EXAM: BP 130/67 (BP Location: Right Arm, Patient Position: Sitting, Cuff Size: Normal)   Pulse 67   Temp 97.9 F (36.6 C) (Oral)   Ht 5\' 2"  (1.575 m)   Wt 130 lb 6.4 oz (59.1 kg)   SpO2 98%   BMI 23.85 kg/m  GEN: NAD CV:  RRR, no murmurs, rubs, or gallops PULM: CTAB, normal effort ABD: Soft, nontender, nondistended, NABS, no organomegaly SKIN: No rash or cyanosis; warm and well-perfused EXTR: No lower extremity edema or calf tenderness PSYCH: Mood and affect euthymic, normal rate and volume of speech NEURO: Awake, alert, no focal deficits grossly, normal speech   ASSESSMENT/PLAN:  Chronic back pain Stable. Refilled Tramadol and Flexeril.   Bradycardia Resolved with current dose of Metoprolol.   HYPERTENSION, BENIGN SYSTEMIC BP at goal. Continue current regimen. Enalapril 20mg  daily, Metoprolol 25mg  BID, HCTZ 12.5mg  daily.   We discussed that she is due for many of her health maintenance tests. She has wanted to wait until she is able to get records from her prior PCP in [place]. The clinic and the patient has had trouble getting in contact with the PCP's office. She would like to wait one more month and try to go down to [facility] to see if she will be able to obtain records. We decided to wait one more month. Additionally, patient has history of CLL and does not recall being seen by Oncologist. We initially made a referral when she first established but she was unable to make this appointment. She is agreeable for me to make a referral today. Referral made.   FOLLOW UP: Follow up in 1 month for DM2  Smiley Houseman, MD PGY [place]

## 2017-04-05 NOTE — Assessment & Plan Note (Signed)
BP at goal. Continue current regimen. Enalapril 20mg  daily, Metoprolol 25mg  BID, HCTZ 12.5mg  daily.

## 2017-04-09 ENCOUNTER — Encounter (HOSPITAL_COMMUNITY): Payer: Self-pay | Admitting: Emergency Medicine

## 2017-04-09 ENCOUNTER — Inpatient Hospital Stay (HOSPITAL_COMMUNITY)
Admission: EM | Admit: 2017-04-09 | Discharge: 2017-04-13 | DRG: 392 | Disposition: A | Discharge status: Home / Self Care | Payer: Medicare HMO | Attending: Family Medicine | Admitting: Family Medicine

## 2017-04-09 ENCOUNTER — Emergency Department (HOSPITAL_COMMUNITY): Payer: Medicare HMO

## 2017-04-09 DIAGNOSIS — Z9049 Acquired absence of other specified parts of digestive tract: Secondary | ICD-10-CM

## 2017-04-09 DIAGNOSIS — Z7984 Long term (current) use of oral hypoglycemic drugs: Secondary | ICD-10-CM

## 2017-04-09 DIAGNOSIS — K219 Gastro-esophageal reflux disease without esophagitis: Secondary | ICD-10-CM | POA: Diagnosis present

## 2017-04-09 DIAGNOSIS — Z9889 Other specified postprocedural states: Secondary | ICD-10-CM

## 2017-04-09 DIAGNOSIS — C9110 Chronic lymphocytic leukemia of B-cell type not having achieved remission: Secondary | ICD-10-CM | POA: Diagnosis present

## 2017-04-09 DIAGNOSIS — K529 Noninfective gastroenteritis and colitis, unspecified: Secondary | ICD-10-CM | POA: Diagnosis not present

## 2017-04-09 DIAGNOSIS — Z833 Family history of diabetes mellitus: Secondary | ICD-10-CM

## 2017-04-09 DIAGNOSIS — Z825 Family history of asthma and other chronic lower respiratory diseases: Secondary | ICD-10-CM

## 2017-04-09 DIAGNOSIS — D72829 Elevated white blood cell count, unspecified: Secondary | ICD-10-CM

## 2017-04-09 DIAGNOSIS — Z841 Family history of disorders of kidney and ureter: Secondary | ICD-10-CM

## 2017-04-09 DIAGNOSIS — E1122 Type 2 diabetes mellitus with diabetic chronic kidney disease: Secondary | ICD-10-CM | POA: Diagnosis present

## 2017-04-09 DIAGNOSIS — R1084 Generalized abdominal pain: Secondary | ICD-10-CM | POA: Diagnosis not present

## 2017-04-09 DIAGNOSIS — Z8673 Personal history of transient ischemic attack (TIA), and cerebral infarction without residual deficits: Secondary | ICD-10-CM

## 2017-04-09 DIAGNOSIS — E1151 Type 2 diabetes mellitus with diabetic peripheral angiopathy without gangrene: Secondary | ICD-10-CM | POA: Diagnosis present

## 2017-04-09 DIAGNOSIS — R109 Unspecified abdominal pain: Secondary | ICD-10-CM | POA: Diagnosis present

## 2017-04-09 DIAGNOSIS — G8929 Other chronic pain: Secondary | ICD-10-CM | POA: Diagnosis present

## 2017-04-09 DIAGNOSIS — Z830 Family history of human immunodeficiency virus [HIV] disease: Secondary | ICD-10-CM

## 2017-04-09 DIAGNOSIS — F17210 Nicotine dependence, cigarettes, uncomplicated: Secondary | ICD-10-CM | POA: Diagnosis present

## 2017-04-09 DIAGNOSIS — Z818 Family history of other mental and behavioral disorders: Secondary | ICD-10-CM

## 2017-04-09 DIAGNOSIS — N183 Chronic kidney disease, stage 3 (moderate): Secondary | ICD-10-CM | POA: Diagnosis present

## 2017-04-09 DIAGNOSIS — J449 Chronic obstructive pulmonary disease, unspecified: Secondary | ICD-10-CM | POA: Diagnosis present

## 2017-04-09 DIAGNOSIS — M549 Dorsalgia, unspecified: Secondary | ICD-10-CM | POA: Diagnosis present

## 2017-04-09 DIAGNOSIS — Z8249 Family history of ischemic heart disease and other diseases of the circulatory system: Secondary | ICD-10-CM

## 2017-04-09 DIAGNOSIS — Z23 Encounter for immunization: Secondary | ICD-10-CM | POA: Diagnosis not present

## 2017-04-09 DIAGNOSIS — E785 Hyperlipidemia, unspecified: Secondary | ICD-10-CM | POA: Diagnosis present

## 2017-04-09 DIAGNOSIS — R509 Fever, unspecified: Secondary | ICD-10-CM

## 2017-04-09 DIAGNOSIS — Z79899 Other long term (current) drug therapy: Secondary | ICD-10-CM

## 2017-04-09 DIAGNOSIS — Z7951 Long term (current) use of inhaled steroids: Secondary | ICD-10-CM

## 2017-04-09 DIAGNOSIS — Z823 Family history of stroke: Secondary | ICD-10-CM

## 2017-04-09 DIAGNOSIS — Z8049 Family history of malignant neoplasm of other genital organs: Secondary | ICD-10-CM

## 2017-04-09 DIAGNOSIS — Z800 Family history of malignant neoplasm of digestive organs: Secondary | ICD-10-CM

## 2017-04-09 DIAGNOSIS — Z888 Allergy status to other drugs, medicaments and biological substances status: Secondary | ICD-10-CM

## 2017-04-09 DIAGNOSIS — I129 Hypertensive chronic kidney disease with stage 1 through stage 4 chronic kidney disease, or unspecified chronic kidney disease: Secondary | ICD-10-CM | POA: Diagnosis present

## 2017-04-09 DIAGNOSIS — I700 Atherosclerosis of aorta: Secondary | ICD-10-CM | POA: Diagnosis not present

## 2017-04-09 LAB — URINALYSIS, ROUTINE W REFLEX MICROSCOPIC
Bilirubin Urine: NEGATIVE
Glucose, UA: NEGATIVE mg/dL
Hgb urine dipstick: NEGATIVE
Ketones, ur: NEGATIVE mg/dL
Leukocytes, UA: NEGATIVE
Nitrite: NEGATIVE
Protein, ur: NEGATIVE mg/dL
Specific Gravity, Urine: 1.016 (ref 1.005–1.030)
pH: 7 (ref 5.0–8.0)

## 2017-04-09 LAB — COMPREHENSIVE METABOLIC PANEL
ALT: 11 U/L — ABNORMAL LOW (ref 14–54)
AST: 19 U/L (ref 15–41)
Albumin: 4.3 g/dL (ref 3.5–5.0)
Alkaline Phosphatase: 97 U/L (ref 38–126)
Anion gap: 9 (ref 5–15)
BUN: 10 mg/dL (ref 6–20)
CO2: 28 mmol/L (ref 22–32)
Calcium: 9.7 mg/dL (ref 8.9–10.3)
Chloride: 102 mmol/L (ref 101–111)
Creatinine, Ser: 1.13 mg/dL — ABNORMAL HIGH (ref 0.44–1.00)
GFR calc Af Amer: 56 mL/min — ABNORMAL LOW (ref >60)
GFR calc non Af Amer: 48 mL/min — ABNORMAL LOW (ref >60)
Glucose, Bld: 144 mg/dL — ABNORMAL HIGH (ref 65–99)
Potassium: 4 mmol/L (ref 3.5–5.1)
Sodium: 139 mmol/L (ref 135–145)
Total Bilirubin: 0.8 mg/dL (ref 0.3–1.2)
Total Protein: 7.2 g/dL (ref 6.5–8.1)

## 2017-04-09 LAB — CBC WITH DIFFERENTIAL/PLATELET
Band Neutrophils: 0 %
Basophils Absolute: 0 10*3/uL (ref 0.0–0.1)
Basophils Relative: 0 %
Blasts: 0 %
Eosinophils Absolute: 0 10*3/uL (ref 0.0–0.7)
Eosinophils Relative: 0 %
HCT: 41.1 % (ref 36.0–46.0)
Hemoglobin: 13.3 g/dL (ref 12.0–15.0)
Lymphocytes Relative: 45 %
Lymphs Abs: 19.9 10*3/uL — ABNORMAL HIGH (ref 0.7–4.0)
MCH: 28.7 pg (ref 26.0–34.0)
MCHC: 32.4 g/dL (ref 30.0–36.0)
MCV: 88.8 fL (ref 78.0–100.0)
Metamyelocytes Relative: 0 %
Monocytes Absolute: 3.5 10*3/uL — ABNORMAL HIGH (ref 0.1–1.0)
Monocytes Relative: 8 %
Myelocytes: 0 %
Neutro Abs: 20.8 10*3/uL — ABNORMAL HIGH (ref 1.7–7.7)
Neutrophils Relative %: 47 %
Other: 0 %
Platelets: 213 10*3/uL (ref 150–400)
Promyelocytes Absolute: 0 %
RBC: 4.63 MIL/uL (ref 3.87–5.11)
RDW: 15.5 % (ref 11.5–15.5)
WBC: 44.2 10*3/uL — ABNORMAL HIGH (ref 4.0–10.5)
nRBC: 0 /100 WBC

## 2017-04-09 LAB — LIPASE, BLOOD: Lipase: 20 U/L (ref 11–51)

## 2017-04-09 LAB — I-STAT CG4 LACTIC ACID, ED: Lactic Acid, Venous: 1.35 mmol/L (ref 0.5–1.9)

## 2017-04-09 MED ORDER — PIPERACILLIN-TAZOBACTAM 3.375 G IVPB 30 MIN
3.3750 g | Freq: Once | INTRAVENOUS | Status: AC
  Administered 2017-04-10: 3.375 g via INTRAVENOUS
  Filled 2017-04-09: qty 50

## 2017-04-09 MED ORDER — SODIUM CHLORIDE 0.9 % IV BOLUS (SEPSIS)
1000.0000 mL | Freq: Once | INTRAVENOUS | Status: AC
  Administered 2017-04-09: 1000 mL via INTRAVENOUS

## 2017-04-09 MED ORDER — HYDROMORPHONE HCL 2 MG/ML IJ SOLN
1.0000 mg | Freq: Once | INTRAMUSCULAR | Status: AC
  Administered 2017-04-09: 1 mg via INTRAVENOUS
  Filled 2017-04-09: qty 1

## 2017-04-09 MED ORDER — IOPAMIDOL (ISOVUE-300) INJECTION 61%
100.0000 mL | Freq: Once | INTRAVENOUS | Status: AC | PRN
  Administered 2017-04-09: 80 mL via INTRAVENOUS

## 2017-04-09 MED ORDER — MORPHINE SULFATE (PF) 10 MG/ML IV SOLN
6.0000 mg | Freq: Once | INTRAVENOUS | Status: AC
Start: 2017-04-09 — End: 2017-04-09
  Administered 2017-04-09: 6 mg via INTRAVENOUS
  Filled 2017-04-09: qty 1

## 2017-04-09 MED ORDER — ACETAMINOPHEN 325 MG PO TABS
650.0000 mg | ORAL_TABLET | Freq: Once | ORAL | Status: AC | PRN
  Administered 2017-04-09: 650 mg via ORAL
  Filled 2017-04-09: qty 2

## 2017-04-09 NOTE — ED Triage Notes (Addendum)
Pt complaint of severe abdominal "grabbing" onset 2 days ago; pt verbalizes associated constipation. Pt denies n/v/d or urinary symptoms. Pt continues to verbalize associated chills/headache.

## 2017-04-09 NOTE — ED Notes (Signed)
Patient transported to X-ray 

## 2017-04-10 ENCOUNTER — Encounter (HOSPITAL_COMMUNITY): Payer: Self-pay | Admitting: *Deleted

## 2017-04-10 DIAGNOSIS — R109 Unspecified abdominal pain: Secondary | ICD-10-CM | POA: Diagnosis not present

## 2017-04-10 DIAGNOSIS — R509 Fever, unspecified: Secondary | ICD-10-CM | POA: Diagnosis not present

## 2017-04-10 DIAGNOSIS — D72829 Elevated white blood cell count, unspecified: Secondary | ICD-10-CM

## 2017-04-10 DIAGNOSIS — K529 Noninfective gastroenteritis and colitis, unspecified: Secondary | ICD-10-CM

## 2017-04-10 LAB — CBC
HCT: 35.7 % — ABNORMAL LOW (ref 36.0–46.0)
Hemoglobin: 11.6 g/dL — ABNORMAL LOW (ref 12.0–15.0)
MCH: 28.8 pg (ref 26.0–34.0)
MCHC: 32.5 g/dL (ref 30.0–36.0)
MCV: 88.6 fL (ref 78.0–100.0)
Platelets: 204 10*3/uL (ref 150–400)
RBC: 4.03 MIL/uL (ref 3.87–5.11)
RDW: 15.7 % — ABNORMAL HIGH (ref 11.5–15.5)
WBC: 42.1 10*3/uL — ABNORMAL HIGH (ref 4.0–10.5)

## 2017-04-10 LAB — BASIC METABOLIC PANEL
Anion gap: 9 (ref 5–15)
BUN: 8 mg/dL (ref 6–20)
CO2: 30 mmol/L (ref 22–32)
Calcium: 8.9 mg/dL (ref 8.9–10.3)
Chloride: 103 mmol/L (ref 101–111)
Creatinine, Ser: 1.21 mg/dL — ABNORMAL HIGH (ref 0.44–1.00)
GFR calc Af Amer: 52 mL/min — ABNORMAL LOW (ref >60)
GFR calc non Af Amer: 45 mL/min — ABNORMAL LOW (ref >60)
Glucose, Bld: 116 mg/dL — ABNORMAL HIGH (ref 65–99)
Potassium: 3.2 mmol/L — ABNORMAL LOW (ref 3.5–5.1)
Sodium: 142 mmol/L (ref 135–145)

## 2017-04-10 LAB — GLUCOSE, CAPILLARY
Glucose-Capillary: 135 mg/dL — ABNORMAL HIGH (ref 65–99)
Glucose-Capillary: 105 mg/dL — ABNORMAL HIGH (ref 65–99)
Glucose-Capillary: 94 mg/dL (ref 65–99)
Glucose-Capillary: 172 mg/dL — ABNORMAL HIGH (ref 65–99)
Glucose-Capillary: 79 mg/dL (ref 65–99)

## 2017-04-10 MED ORDER — ACETAMINOPHEN 650 MG RE SUPP: 650.0000 mg | Freq: Four times a day (QID) | RECTAL | Status: DC | PRN

## 2017-04-10 MED ORDER — CILOSTAZOL 100 MG PO TABS
50.0000 mg | ORAL_TABLET | Freq: Two times a day (BID) | ORAL | Status: DC
  Administered 2017-04-10 – 2017-04-13 (×7): 50 mg via ORAL
  Filled 2017-04-10 (×7): qty 1

## 2017-04-10 MED ORDER — PANTOPRAZOLE SODIUM 40 MG IV SOLR
40.0000 mg | Freq: Every day | INTRAVENOUS | Status: DC
  Administered 2017-04-10: 40 mg via INTRAVENOUS
  Filled 2017-04-10: qty 40

## 2017-04-10 MED ORDER — POLYETHYLENE GLYCOL 3350 17 G PO PACK: 17.0000 g | PACK | Freq: Every day | ORAL | Status: DC | PRN

## 2017-04-10 MED ORDER — PIPERACILLIN-TAZOBACTAM 3.375 G IVPB
3.3750 g | Freq: Three times a day (TID) | INTRAVENOUS | Status: DC
  Administered 2017-04-10 – 2017-04-11 (×4): 3.375 g via INTRAVENOUS
  Filled 2017-04-10 (×5): qty 50

## 2017-04-10 MED ORDER — SIMVASTATIN 20 MG PO TABS
20.0000 mg | ORAL_TABLET | Freq: Every day | ORAL | Status: DC
  Administered 2017-04-10 – 2017-04-12 (×3): 20 mg via ORAL
  Filled 2017-04-10 (×3): qty 1

## 2017-04-10 MED ORDER — ACETAMINOPHEN 325 MG PO TABS: 650.0000 mg | ORAL_TABLET | Freq: Four times a day (QID) | ORAL | Status: DC | PRN

## 2017-04-10 MED ORDER — SENNOSIDES-DOCUSATE SODIUM 8.6-50 MG PO TABS
1.0000 | ORAL_TABLET | Freq: Every day | ORAL | Status: DC
  Administered 2017-04-10: 1 via ORAL
  Filled 2017-04-10: qty 1

## 2017-04-10 MED ORDER — METOPROLOL TARTRATE 25 MG PO TABS
25.0000 mg | ORAL_TABLET | Freq: Two times a day (BID) | ORAL | Status: DC
  Administered 2017-04-10 – 2017-04-13 (×7): 25 mg via ORAL
  Filled 2017-04-10 (×8): qty 1

## 2017-04-10 MED ORDER — CLOPIDOGREL BISULFATE 75 MG PO TABS
75.0000 mg | ORAL_TABLET | Freq: Every day | ORAL | Status: DC
  Administered 2017-04-10 – 2017-04-13 (×4): 75 mg via ORAL
  Filled 2017-04-10 (×4): qty 1

## 2017-04-10 MED ORDER — ONDANSETRON HCL 4 MG PO TABS
4.0000 mg | ORAL_TABLET | Freq: Four times a day (QID) | ORAL | Status: DC | PRN
  Administered 2017-04-10: 4 mg via ORAL
  Filled 2017-04-10: qty 1

## 2017-04-10 MED ORDER — TRAMADOL HCL 50 MG PO TABS
50.0000 mg | ORAL_TABLET | Freq: Four times a day (QID) | ORAL | Status: DC | PRN
  Administered 2017-04-10 – 2017-04-12 (×3): 50 mg via ORAL
  Filled 2017-04-10 (×3): qty 1

## 2017-04-10 MED ORDER — SODIUM CHLORIDE 0.9 % IV SOLN
INTRAVENOUS | Status: DC
  Administered 2017-04-10 – 2017-04-11 (×3): via INTRAVENOUS

## 2017-04-10 MED ORDER — MORPHINE SULFATE (PF) 2 MG/ML IV SOLN
1.0000 mg | INTRAVENOUS | Status: DC | PRN
  Administered 2017-04-10 (×2): 1 mg via INTRAVENOUS
  Filled 2017-04-10 (×3): qty 1

## 2017-04-10 MED ORDER — HYDROCHLOROTHIAZIDE 12.5 MG PO CAPS
12.5000 mg | ORAL_CAPSULE | Freq: Every day | ORAL | Status: DC
  Administered 2017-04-10 – 2017-04-13 (×4): 12.5 mg via ORAL
  Filled 2017-04-10 (×5): qty 1

## 2017-04-10 MED ORDER — ENALAPRIL MALEATE 5 MG PO TABS
20.0000 mg | ORAL_TABLET | Freq: Every day | ORAL | Status: DC
  Administered 2017-04-10 – 2017-04-13 (×4): 20 mg via ORAL
  Filled 2017-04-10 (×5): qty 4

## 2017-04-10 MED ORDER — PNEUMOCOCCAL VAC POLYVALENT 25 MCG/0.5ML IJ INJ
0.5000 mL | INJECTION | INTRAMUSCULAR | Status: AC
  Administered 2017-04-11: 0.5 mL via INTRAMUSCULAR
  Filled 2017-04-10: qty 0.5

## 2017-04-10 MED ORDER — ONDANSETRON HCL 4 MG/2ML IJ SOLN
4.0000 mg | Freq: Four times a day (QID) | INTRAMUSCULAR | Status: DC | PRN
  Administered 2017-04-10 – 2017-04-12 (×2): 4 mg via INTRAVENOUS
  Filled 2017-04-10 (×2): qty 2

## 2017-04-10 MED ORDER — SODIUM CHLORIDE 0.9 % IV SOLN
30.0000 meq | Freq: Once | INTRAVENOUS | Status: AC
  Administered 2017-04-10: 30 meq via INTRAVENOUS
  Filled 2017-04-10 (×2): qty 15

## 2017-04-10 MED ORDER — ENOXAPARIN SODIUM 40 MG/0.4ML ~~LOC~~ SOLN
40.0000 mg | Freq: Every day | SUBCUTANEOUS | Status: DC
Start: 2017-04-10 — End: 2017-04-13
  Administered 2017-04-10 – 2017-04-13 (×4): 40 mg via SUBCUTANEOUS
  Filled 2017-04-10 (×4): qty 0.4

## 2017-04-10 MED ORDER — MOMETASONE FURO-FORMOTEROL FUM 200-5 MCG/ACT IN AERO
2.0000 | INHALATION_SPRAY | Freq: Two times a day (BID) | RESPIRATORY_TRACT | Status: DC
  Administered 2017-04-10 – 2017-04-13 (×6): 2 via RESPIRATORY_TRACT
  Filled 2017-04-10: qty 8.8

## 2017-04-10 MED ORDER — MONTELUKAST SODIUM 10 MG PO TABS
10.0000 mg | ORAL_TABLET | Freq: Every day | ORAL | Status: DC
  Administered 2017-04-10 – 2017-04-12 (×3): 10 mg via ORAL
  Filled 2017-04-10 (×3): qty 1

## 2017-04-10 NOTE — Progress Notes (Signed)
Family Medicine Teaching Service Daily Progress Note Intern Pager: [phone]  Patient name: [name] [name] Medical record number: [id number] Date of birth: 07-Jul-1946 Age: 71 y.o. Gender: female  Primary Care Provider: Smiley Houseman, MD Consultants: none Code Status: Full   Pt Overview and Major Events to Date:  4/14 admitted for abdominal pain  Assessment and Plan: [name] [name] is a 71 y.o. female presenting with abdominal pain. PMH is significant for T2DM, CLL, HLD, HTN, PVD (with history of bypass grafting), COPD, CKD III, achalasia (s/p esophageal dilation in 2003), GERD.  Abdominal pain. Now afebrile but with continued leukocytosis (WBC 42.1, on admit differential showed equal percentage neutrophils and lymphocytes). Patient now passing gas.  - Bowel rest, NPO with sips - pain control: tylenol prn mild pain, tramadol 50mg  q6prn moderate pain, IV morphine 1mg  q4prn severe pain - Continue IV zosyn started in ED (4/14-) for possibly intraabdominal infection - blood cultures pending - IV protonix 40mg  qd - MIVF NS@125cc /hr - monitor I/Os  T2DM Last a1c 6.3 on 10/29/16. At home on metformin 500mg  BID - monitor CBGs while NPO - restart metformin when eating  HTN  At home on enalapril 20mg  qd, HCTZ 12.5mg  qd, and metoprolol 25 mg BID - continue home enalapril, HCTZ, metoprolol  HLD last lipid panel 10/29/16 chol 99, HDL 32, TG 181, LDL 31. At home on simvastatin 20mg  qd - continue home statin  CKD At baseline Cr ~1.2 - monitor  GERD at home on pepcid 20mg  qd - IV protonix while NPO  PVD At home on cilostazol and plavix (has aspirin listed as intolerance) - continue home medications  H/o CLL. Last seen at [facility] in 2011 but not evaluated since. Is reestablishing with oncology as outpatient. Per chart review baseline WBC appears to have been increasing from 14-16 to 20 in 2016. WBC on admission likely d/t acute process but 47% neutrophils and 45%  lymphocytes. - monitor CBC   FEN/GI: NPO sips with meds, protonix, senna-docusate qhs, miralax prn Prophylaxis: lovenox   Disposition: pending medical management  Subjective:  States has continued crampy abdominal pain that is unchanged. No fever/chills this am. Has been passing gas. No nausea or vomiting.  Objective: Temp:  [97.5 F (36.4 C)-101.4 F (38.6 C)] 97.5 F (36.4 C) (04/15 0532) Pulse Rate:  [59-99] 59 (04/15 0532) Resp:  [12-18] 16 (04/15 0532) BP: (136-162)/(63-90) 162/63 (04/15 0532) SpO2:  [90 %-99 %] 97 % (04/15 0532) Weight:  [58.9 kg (129 lb 12.8 oz)-58.9 kg (129 lb 13.6 oz)] 58.9 kg (129 lb 13.6 oz) (04/15 0149) Physical Exam: General: lying in bed, appears uncomfortable but in NAD Cardiovascular: RRR, no murmurs Respiratory: CTAB, normal effort on room air Abdomen:  diffusely TTP, soft, mildly distended, + bowel sounds. No rebound. Extremities: no LE edema. Warm and well perfused.  Laboratory:  Recent Labs Lab 04/09/17 1936 04/10/17 0520  WBC 44.2* 42.1*  HGB 13.3 11.6*  HCT 41.1 35.7*  PLT 213 204    Recent Labs Lab 04/09/17 1936 04/10/17 0520  NA 139 142  K 4.0 3.2*  CL 102 103  CO2 28 30  BUN 10 8  CREATININE 1.13* 1.21*  CALCIUM 9.7 8.9  PROT 7.2  --   BILITOT 0.8  --   ALKPHOS 97  --   ALT 11*  --   AST 19  --   GLUCOSE 144* 116*   Lactate 1.35  Imaging/Diagnostic Tests: Dg Chest 2 View  Result Date: 04/10/2017 CLINICAL  DATA:  Abdominal pain.  Chills. EXAM: CHEST  2 VIEW COMPARISON:  Chest radiograph 02/ 11/2012 FINDINGS: The heart size and mediastinal contours are within normal limits. There is atherosclerotic calcification of the aortic arch Both lungs are clear. The visualized skeletal structures are unremarkable. IMPRESSION: No active cardiopulmonary disease. Electronically Signed   By: Ulyses Jarred M.D.   On: 04/10/2017 00:28   Ct Abdomen Pelvis W Contrast  Result Date: 04/09/2017 CLINICAL DATA:  Periumbilical  pain and nausea today. History of CLL, COPD, bypass graft of right lower extremity and cholecystectomy. EXAM: CT ABDOMEN AND PELVIS WITH CONTRAST TECHNIQUE: Multidetector CT imaging of the abdomen and pelvis was performed using the standard protocol following bolus administration of intravenous contrast. CONTRAST:  68mL ISOVUE-300 IOPAMIDOL (ISOVUE-300) INJECTION 61% COMPARISON:  06/06/2015 CT FINDINGS: Lower chest: Normal visualized cardiac chamber size without pericardial effusion. Clear lung bases. Small stable hiatal hernia. Hepatobiliary: No focal liver abnormality is seen. Status post cholecystectomy. No biliary dilatation. Pancreas: Unremarkable. No pancreatic ductal dilatation or surrounding inflammatory changes. Spleen: Normal in size without focal abnormality. Adrenals/Urinary Tract: Adrenal glands are unremarkable. Kidneys are normal, without renal calculi, focal lesion, or hydronephrosis. Bladder is unremarkable. Stomach/Bowel: Contracted stomach. Normal small bowel rotation without obstruction. Slight fluid-filled distention of small bowel loops in the upper pelvis may reflect changes of by a mild enteritis. Moderate colonic stool burden within the ascending through transverse colon and again within the sigmoid colon to rectum. Normal appendix. Vascular/Lymphatic: Aortic atherosclerosis without aneurysm. Left patent common iliac stent. Partially visualized patent left common femoral artery graft. Patent right mid to distal common iliac stent. No lymphadenopathy. Reproductive: Uterus and bilateral adnexa are unremarkable. Other: No abdominal wall hernia or abnormality. No abdominopelvic ascites. Musculoskeletal: No acute or significant osseous findings. IMPRESSION: 1. Mild fluid-filled distention of small bowel loops in the upper pelvis may reflect changes of a mild enteritis. No bowel obstruction is seen. 2. Small hiatal hernia remains stable in appearance. 3. Aortic atherosclerosis with stents as above  described within the common iliac arteries bilaterally and partially visualized persistent left common femoral artery graft. 4. Status post cholecystectomy. Electronically Signed   By: Ashley Royalty M.D.   On: 04/09/2017 22:24    Bufford Lope, DO 04/10/2017, 9:12 AM PGY-1, [place] Intern pager: [phone], text pages welcome

## 2017-04-10 NOTE — ED Provider Notes (Signed)
[facility] DEPT Provider Note   CSN: [id number] Arrival date & time: 04/09/17  1818     History   Chief Complaint Chief Complaint  Patient presents with  . Abdominal Pain    HPI [name] [name] is a 71 y.o. female.  The history is provided by the patient.  Abdominal Pain   This is a new problem. The current episode started 2 days ago. The problem occurs constantly. The problem has not changed since onset.The pain is located in the generalized abdominal region. The pain is at a severity of 10/10. The pain is severe. Associated symptoms include fever and constipation. Nothing aggravates the symptoms. Nothing relieves the symptoms.    Past Medical History:  Diagnosis Date  . Asthma   . CLL (chronic lymphoblastic leukemia)   . COPD (chronic obstructive pulmonary disease) ([place])   . Diabetes mellitus without complication ([place])   . GERD (gastroesophageal reflux disease)   . Hyperlipidemia   . Hypertension   . PVD (peripheral vascular disease) ([place])   . Stroke [facility])    in 1998 due to tumor     Patient Active Problem List   Diagnosis Date Noted  . Abdominal pain 04/09/2017  . Bradycardia 07/01/2016  . Chronic kidney disease (CKD), stage III (moderate) 06/04/2016  . Shoulder pain, left 04/06/2016  . Chest pain 02/06/2016  . Chronic back pain 10/02/2015  . Acute right hip pain 03/13/2012  . Constipation 11/15/2011  . Episodic tension type headache 09/24/2010  . GAIT IMBALANCE 12/10/2009  . DM (diabetes mellitus), type 2 with peripheral vascular complications ([place]) 98/92/1194  . Chronic lymphocytic leukemia ([place]) 04/11/2009  . Adjustment disorder with depressed mood 12/05/2008  . PERIPHERAL VASCULAR DISEASE 10/13/2007  . HYPERCHOLESTEROLEMIA 02/23/2007  . ANXIETY 02/23/2007  . TOBACCO DEPENDENCE 02/23/2007  . HYPERTENSION, BENIGN SYSTEMIC 02/23/2007  . COPD 02/23/2007  . GASTROESOPHAGEAL REFLUX, NO ESOPHAGITIS 02/23/2007    Past Surgical History:  Procedure  Laterality Date  . ABI  02/2012   ABI <0.65 BL 02/2012  . BRAIN MENINGIOMA EXCISION    . Bypass grafting of RLE for PAD claudication     . CESAREAN SECTION     1974, 78 ,79  . CHOLECYSTECTOMY, LAPAROSCOPIC    . ESOPHAGEAL DILATION      OB History    No data available       Home Medications    Prior to Admission medications   Medication Sig Start Date End Date Taking? Authorizing Provider  albuterol (PROVENTIL HFA;VENTOLIN HFA) 108 (90 Base) MCG/ACT inhaler Inhale 2 puffs into the lungs every 4 (four) hours as needed for wheezing or shortness of breath. 10/26/16  Yes Smiley Houseman, MD  cilostazol (PLETAL) 50 MG tablet Take 1 tablet (50 mg total) by mouth 2 (two) times daily. 02/25/17  Yes Smiley Houseman, MD  clopidogrel (PLAVIX) 75 MG tablet Take 1 tablet (75 mg total) by mouth daily. 02/25/17  Yes Smiley Houseman, MD  cyclobenzaprine (FLEXERIL) 10 MG tablet Take 1 tablet (10 mg total) by mouth daily as needed for muscle spasms. 04/05/17  Yes Smiley Houseman, MD  enalapril (VASOTEC) 20 MG tablet Take 1 tablet (20 mg total) by mouth daily. 02/25/17  Yes Smiley Houseman, MD  famotidine (PEPCID) 20 MG tablet Take 1 tablet (20 mg total) by mouth daily. 02/25/17  Yes Smiley Houseman, MD  Fluticasone-Salmeterol (ADVAIR DISKUS) 250-50 MCG/DOSE AEPB Inhale 1 puff into the lungs 2 (two) times daily. 02/25/17  Yes Almond Lint  Dallas Schimke, MD  hydrochlorothiazide (MICROZIDE) 12.5 MG capsule Take 1 capsule (12.5 mg total) by mouth daily. 02/25/17  Yes Smiley Houseman, MD  loratadine (CLARITIN) 10 MG tablet Take 1 tablet (10 mg total) by mouth daily. 02/25/17  Yes Smiley Houseman, MD  metFORMIN (GLUCOPHAGE) 500 MG tablet Take 1 tablet (500 mg total) by mouth 2 (two) times daily with a meal. 02/25/17  Yes Smiley Houseman, MD  metoprolol tartrate (LOPRESSOR) 25 MG tablet Take 1 tablet (25 mg total) by mouth 2 (two) times daily. 02/25/17  Yes Smiley Houseman, MD  montelukast  (SINGULAIR) 10 MG tablet Take 1 tablet (10 mg total) by mouth at bedtime. 02/25/17  Yes Smiley Houseman, MD  nitroGLYCERIN (NITROSTAT) 0.4 MG SL tablet Place 1 tablet (0.4 mg total) under the tongue every 5 (five) minutes as needed for chest pain. Only take up to 3 tablets. 12/22/16  Yes Smiley Houseman, MD  simvastatin (ZOCOR) 20 MG tablet Take 1 tablet (20 mg total) by mouth at bedtime. 02/25/17  Yes Smiley Houseman, MD  traMADol (ULTRAM) 50 MG tablet Take 1 tablet (50 mg total) by mouth daily as needed (For pain.). Reported on 01/01/2016 04/05/17  Yes Smiley Houseman, MD    Family History Family History  Problem Relation Age of Onset  . Heart disease Mother   . Asthma Mother   . Cancer Mother     uterine   . Depression Mother   . Heart attack Mother 31  . Hyperlipidemia Mother   . Hypertension Mother   . Stroke Mother   . Kidney disease Mother   . Heart attack Sister   . Stroke Sister   . Depression Sister   . Diabetes Sister   . Hyperlipidemia Sister   . Depression Sister   . Diabetes Sister   . Hyperlipidemia Sister   . HIV/AIDS Sister   . Diabetes Brother   . Asthma Brother   . Hypertension Brother   . Cancer Maternal Aunt     lung  . Heart disease Maternal Grandmother   . Heart attack Maternal Grandmother   . Cancer Brother     colon  . HIV/AIDS Brother     Social History Social History  Substance Use Topics  . Smoking status: Current Some Day Smoker    Packs/day: 0.30    Years: 15.00    Types: Cigarettes    Start date: 01/01/1976  . Smokeless tobacco: Never Used     Comment: pt states that she does not smoke everyday   . Alcohol use No     Allergies   Aspirin and Baclofen   Review of Systems Review of Systems  Constitutional: Positive for fever.  Gastrointestinal: Positive for abdominal pain and constipation.  All other systems reviewed and are negative.    Physical Exam Updated Vital Signs BP (!) 156/69 (BP Location: Left Arm)    Pulse 69   Temp (!) 101.4 F (38.6 C) (Oral)   Resp 12   Wt 129 lb 12.8 oz (58.9 kg)   SpO2 90%   BMI 23.74 kg/m   Physical Exam  Constitutional: She is oriented to person, place, and time. She appears well-developed and well-nourished.  HENT:  Head: Normocephalic and atraumatic.  Eyes: Conjunctivae and EOM are normal.  Neck: Normal range of motion.  Cardiovascular: Normal rate and regular rhythm.   Pulmonary/Chest: No stridor. No respiratory distress.  Abdominal: She exhibits distension. There is tenderness.  Musculoskeletal: Normal range  of motion. She exhibits no edema or deformity.  Neurological: She is alert and oriented to person, place, and time. No cranial nerve deficit. Coordination normal.  Nursing note and vitals reviewed.    ED Treatments / Results  Labs (all labs ordered are listed, but only abnormal results are displayed) Labs Reviewed  COMPREHENSIVE METABOLIC PANEL - Abnormal; Notable for the following:       Result Value   Glucose, Bld 144 (*)    Creatinine, Ser 1.13 (*)    ALT 11 (*)    GFR calc non Af Amer 48 (*)    GFR calc Af Amer 56 (*)    All other components within normal limits  CBC WITH DIFFERENTIAL/PLATELET - Abnormal; Notable for the following:    WBC 44.2 (*)    Neutro Abs 20.8 (*)    Lymphs Abs 19.9 (*)    Monocytes Absolute 3.5 (*)    All other components within normal limits  CULTURE, BLOOD (ROUTINE X 2)  CULTURE, BLOOD (ROUTINE X 2)  URINALYSIS, ROUTINE W REFLEX MICROSCOPIC  LIPASE, BLOOD  PATHOLOGIST SMEAR REVIEW  I-STAT CG4 LACTIC ACID, ED    EKG  EKG Interpretation None       Radiology Dg Chest 2 View  Result Date: 04/10/2017 CLINICAL DATA:  Abdominal pain.  Chills. EXAM: CHEST  2 VIEW COMPARISON:  Chest radiograph 02/ 11/2012 FINDINGS: The heart size and mediastinal contours are within normal limits. There is atherosclerotic calcification of the aortic arch Both lungs are clear. The visualized skeletal structures are  unremarkable. IMPRESSION: No active cardiopulmonary disease. Electronically Signed   By: Ulyses Jarred M.D.   On: 04/10/2017 00:28   Ct Abdomen Pelvis W Contrast  Result Date: 04/09/2017 CLINICAL DATA:  Periumbilical pain and nausea today. History of CLL, COPD, bypass graft of right lower extremity and cholecystectomy. EXAM: CT ABDOMEN AND PELVIS WITH CONTRAST TECHNIQUE: Multidetector CT imaging of the abdomen and pelvis was performed using the standard protocol following bolus administration of intravenous contrast. CONTRAST:  64mL ISOVUE-300 IOPAMIDOL (ISOVUE-300) INJECTION 61% COMPARISON:  06/06/2015 CT FINDINGS: Lower chest: Normal visualized cardiac chamber size without pericardial effusion. Clear lung bases. Small stable hiatal hernia. Hepatobiliary: No focal liver abnormality is seen. Status post cholecystectomy. No biliary dilatation. Pancreas: Unremarkable. No pancreatic ductal dilatation or surrounding inflammatory changes. Spleen: Normal in size without focal abnormality. Adrenals/Urinary Tract: Adrenal glands are unremarkable. Kidneys are normal, without renal calculi, focal lesion, or hydronephrosis. Bladder is unremarkable. Stomach/Bowel: Contracted stomach. Normal small bowel rotation without obstruction. Slight fluid-filled distention of small bowel loops in the upper pelvis may reflect changes of by a mild enteritis. Moderate colonic stool burden within the ascending through transverse colon and again within the sigmoid colon to rectum. Normal appendix. Vascular/Lymphatic: Aortic atherosclerosis without aneurysm. Left patent common iliac stent. Partially visualized patent left common femoral artery graft. Patent right mid to distal common iliac stent. No lymphadenopathy. Reproductive: Uterus and bilateral adnexa are unremarkable. Other: No abdominal wall hernia or abnormality. No abdominopelvic ascites. Musculoskeletal: No acute or significant osseous findings. IMPRESSION: 1. Mild fluid-filled  distention of small bowel loops in the upper pelvis may reflect changes of a mild enteritis. No bowel obstruction is seen. 2. Small hiatal hernia remains stable in appearance. 3. Aortic atherosclerosis with stents as above described within the common iliac arteries bilaterally and partially visualized persistent left common femoral artery graft. 4. Status post cholecystectomy. Electronically Signed   By: Ashley Royalty M.D.   On: 04/09/2017 22:24  Procedures Procedures (including critical care time)  Medications Ordered in ED Medications  acetaminophen (TYLENOL) tablet 650 mg (650 mg Oral Given 04/09/17 1918)  Morphine Sulfate (PF) SOLN 6 mg (6 mg Intravenous Given 04/09/17 2150)  sodium chloride 0.9 % bolus 1,000 mL (0 mLs Intravenous Stopped 04/09/17 2341)  iopamidol (ISOVUE-300) 61 % injection 100 mL (80 mLs Intravenous Contrast Given 04/09/17 2158)  HYDROmorphone (DILAUDID) injection 1 mg (1 mg Intravenous Given 04/09/17 2341)  piperacillin-tazobactam (ZOSYN) IVPB 3.375 g (0 g Intravenous Stopped 04/10/17 0102)     Initial Impression / Assessment and Plan / ED Course  I have reviewed the triage vital signs and the nursing notes.  Pertinent labs & imaging results that were available during my care of the patient were reviewed by me and considered in my medical decision making (see chart for details).     Sepsis with abdominal pain but CT with just enteritis. Possibly early SBO?  Urine clean Zosyn given 2/2 abdominal pain.  Pain not controlled, in combination with fever, significant leukocytosis with high neutrophils, will admit for pain control, antibiotics and further management.   Final Clinical Impressions(s) / ED Diagnoses   Final diagnoses:  Abdominal pain, unspecified abdominal location  Fever, unspecified fever cause  Leukocytosis, unspecified type      Merrily Pew, MD 04/10/17 0110

## 2017-04-10 NOTE — H&P (Addendum)
[facility] Admission History and Physical Service Pager: [phone]  Patient name: [name] [name] Medical record number: [id number] Date of birth: [birth year] Age: 71 y.o. Gender: female  Primary Care Provider: Smiley Houseman, MD Consultants: none Code Status: Full   Chief Complaint: abdominal pain  Assessment and Plan: [name] [name] is a 71 y.o. female presenting with abdominal pain. PMH is significant for T2DM, CLL, HLD, HTN, PVD (with history of bypass grafting), COPD, CKD III, achalasia (s/p esophageal dilation in 2003), GERD.  Abdominal pain. Presented with crampy diffuse abdominal pain, fever Tmax 101.22F, and leukocytosis WBC 44.2. Meeting 2/4 SIRS criteria. qSofa score of 0. CT abdomen showing mild fluid-filled distention of small bowel loops c/w mild enteritis but no bowel obstruction. DDx includes early SBO given patient history of not tolerating po and lack of BM. Possible intra-abdominal infection but less likely given nontoxic appearance and leukocytosis may be d/t CLL (Abs lymphs 19.9 but with Abs neutro 20.8). Unlikely mesenteric ischemia but to be considered given history of PAD with negative imaging and pain out of proportion to physical exam. Has had cholecystectomy, C-section x 3 in terms of abdominal surgeries.  - Admit to [place], attending Dr. Andria Frames - Bowel rest, NPO with sips - pain control: tylenol prn mild pain, tramadol 50mg  q6prn moderate pain, IV morphine 1mg  q4prn severe pain - Continue IV zosyn started in ED (4/14-) - blood cultures pending - IV protonix 40mg  qd - MIVF NS@125cc /hr - monitor I/Os  T2DM Last a1c 6.3 on 10/29/16. At home on metformin 500mg  BID - monitor CBGs while NPO - restart metformin when eating  HTN BP on admission 140/68. At home on enalapril 20mg  qd, HCTZ 12.5mg  qd, and metoprolol 25 mg BID - continue home enalapril, HCTZ, metoprolol  HLD last lipid panel 10/29/16 chol 99, HDL 32, TG 181, LDL 31. At home  on simvastatin 20mg  qd - continue home statin  CKD At baseline Cr ~1.2 - monitor  GERD at home on pepcid 20mg  qd - IV protonix while NPO  PVD At home on cilostazol and plavix (has aspirin listed as intolerance) - continue home medications  H/o CLL. Last seen at [facility] in 2011 but not evaluated since. Is reestablishing with oncology as outpatient. Per chart review baseline WBC appears to have been increasing from 14-16 to 20 in 2016. WBC on admission likely d/t acute process  - monitor CBC   FEN/GI: NPO sips with meds, protonix Prophylaxis: lovenox  Disposition: admit to FPTS  History of Present Illness:  [name] [name] is a 71 y.o. female presenting with abdominal pain.  Patient states has had intermittent sharp cramping abdominal pain for the past 4 days. Seems to start from belly button and travel outwards causing her entire abdomen to be severely in pain. Episodes lasts for about 3-4 minutes at a time then return in a couple minutes. Has had abdominal pains before but nothing that has caused cramping persistently like this. Pain is not getting better, may be getting worse. Causing difficulty sleeping though has been able to get some rest.   Endorses chills and has felt subjectively warm. Hasn't had much to eat. Threw up for the first time once just prior to transfer from [facility]. No blood in vomit. Has not had a bowel movement in days, since abdominal pain started which is unusual for her, usually has regular bowel moments. Has started passing gas after she arrived to [facility].  Has been able  to take her medication up until yesterday morning. Denies aggravating or alleviating symptoms. Some epigastric pain.    Review Of Systems: Per HPI with the following additions:   Review of Systems  Constitutional: Positive for chills and fever.  Respiratory: Negative for shortness of breath.   Cardiovascular: Negative for chest pain.  Gastrointestinal: Positive for  abdominal pain, heartburn, nausea and vomiting. Negative for blood in stool, constipation, diarrhea and melena.  Genitourinary: Negative for dysuria and urgency.  Skin: Positive for rash (scaling skins on legs).    Patient Active Problem List   Diagnosis Date Noted  . Abdominal pain 04/09/2017  . Bradycardia 07/01/2016  . Chronic kidney disease (CKD), stage III (moderate) 06/04/2016  . Shoulder pain, left 04/06/2016  . Chest pain 02/06/2016  . Chronic back pain 10/02/2015  . Acute right hip pain 03/13/2012  . Constipation 11/15/2011  . Episodic tension type headache 09/24/2010  . GAIT IMBALANCE 12/10/2009  . DM (diabetes mellitus), type 2 with peripheral vascular complications ([place]) 09/18/3006  . Chronic lymphocytic leukemia ([place]) 04/11/2009  . Adjustment disorder with depressed mood 12/05/2008  . PERIPHERAL VASCULAR DISEASE 10/13/2007  . HYPERCHOLESTEROLEMIA 02/23/2007  . ANXIETY 02/23/2007  . TOBACCO DEPENDENCE 02/23/2007  . HYPERTENSION, BENIGN SYSTEMIC 02/23/2007  . COPD 02/23/2007  . GASTROESOPHAGEAL REFLUX, NO ESOPHAGITIS 02/23/2007    Past Medical History: Past Medical History:  Diagnosis Date  . Asthma   . CLL (chronic lymphoblastic leukemia)   . COPD (chronic obstructive pulmonary disease) ([place])   . Diabetes mellitus without complication ([place])   . GERD (gastroesophageal reflux disease)   . Hyperlipidemia   . Hypertension   . PVD (peripheral vascular disease) ([place])   . Stroke [facility])    in 1998 due to tumor     Past Surgical History: Past Surgical History:  Procedure Laterality Date  . ABI  02/2012   ABI <0.65 BL 02/2012  . BRAIN MENINGIOMA EXCISION    . Bypass grafting of RLE for PAD claudication     . CESAREAN SECTION     1974, 78 ,80  . CHOLECYSTECTOMY, LAPAROSCOPIC    . ESOPHAGEAL DILATION      Social History: Social History  Substance Use Topics  . Smoking status: Current Some Day Smoker    Packs/day: 0.30    Years: 15.00    Types: Cigarettes     Start date: 01/01/1976  . Smokeless tobacco: Never Used     Comment: pt states that she does not smoke everyday   . Alcohol use No   Additional social history: No illegal drugs or EtOH. Lives at home with husband.  Please also refer to relevant sections of EMR.  Family History: Family History  Problem Relation Age of Onset  . Heart disease Mother   . Asthma Mother   . Cancer Mother     uterine   . Depression Mother   . Heart attack Mother 35  . Hyperlipidemia Mother   . Hypertension Mother   . Stroke Mother   . Kidney disease Mother   . Heart attack Sister   . Stroke Sister   . Depression Sister   . Diabetes Sister   . Hyperlipidemia Sister   . Depression Sister   . Diabetes Sister   . Hyperlipidemia Sister   . HIV/AIDS Sister   . Diabetes Brother   . Asthma Brother   . Hypertension Brother   . Cancer Maternal Aunt     lung  . Heart disease Maternal Grandmother   .  Heart attack Maternal Grandmother   . Cancer Brother     colon  . HIV/AIDS Brother     Allergies and Medications: Allergies  Allergen Reactions  . Aspirin Other (See Comments)    irritates stomach  . Baclofen Other (See Comments)    Stomach irritation   No current facility-administered medications on file prior to encounter.    Current Outpatient Prescriptions on File Prior to Encounter  Medication Sig Dispense Refill  . albuterol (PROVENTIL HFA;VENTOLIN HFA) 108 (90 Base) MCG/ACT inhaler Inhale 2 puffs into the lungs every 4 (four) hours as needed for wheezing or shortness of breath. 1 Inhaler 5  . cilostazol (PLETAL) 50 MG tablet Take 1 tablet (50 mg total) by mouth 2 (two) times daily. 180 tablet 0  . clopidogrel (PLAVIX) 75 MG tablet Take 1 tablet (75 mg total) by mouth daily. 90 tablet 4  . cyclobenzaprine (FLEXERIL) 10 MG tablet Take 1 tablet (10 mg total) by mouth daily as needed for muscle spasms. 30 tablet 0  . enalapril (VASOTEC) 20 MG tablet Take 1 tablet (20 mg total) by mouth daily. 90  tablet 0  . famotidine (PEPCID) 20 MG tablet Take 1 tablet (20 mg total) by mouth daily. 90 tablet 0  . Fluticasone-Salmeterol (ADVAIR DISKUS) 250-50 MCG/DOSE AEPB Inhale 1 puff into the lungs 2 (two) times daily. 1 each 2  . hydrochlorothiazide (MICROZIDE) 12.5 MG capsule Take 1 capsule (12.5 mg total) by mouth daily. 90 capsule 0  . loratadine (CLARITIN) 10 MG tablet Take 1 tablet (10 mg total) by mouth daily. 90 tablet 2  . metFORMIN (GLUCOPHAGE) 500 MG tablet Take 1 tablet (500 mg total) by mouth 2 (two) times daily with a meal. 180 tablet 0  . metoprolol tartrate (LOPRESSOR) 25 MG tablet Take 1 tablet (25 mg total) by mouth 2 (two) times daily. 180 tablet 0  . montelukast (SINGULAIR) 10 MG tablet Take 1 tablet (10 mg total) by mouth at bedtime. 90 tablet 0  . nitroGLYCERIN (NITROSTAT) 0.4 MG SL tablet Place 1 tablet (0.4 mg total) under the tongue every 5 (five) minutes as needed for chest pain. Only take up to 3 tablets. 6 tablet 0  . simvastatin (ZOCOR) 20 MG tablet Take 1 tablet (20 mg total) by mouth at bedtime. 90 tablet 0  . traMADol (ULTRAM) 50 MG tablet Take 1 tablet (50 mg total) by mouth daily as needed (For pain.). Reported on 01/01/2016 30 tablet 0    Objective: BP 140/68 (BP Location: Left Arm)   Pulse 64   Temp 97.9 F (36.6 C) (Oral)   Resp 15   Wt 58.9 kg (129 lb 13.6 oz)   SpO2 97%   BMI 23.75 kg/m  Exam: General: lying in bed, appears uncomfortable but in NAD Eyes: EOMI, conjunctiva normal ENTM: moist mucous membranes, poor dentition Neck: normal ROM, supple Cardiovascular: RRR, no murmurs Respiratory: CTAB, normal effort on room air Gastrointestinal: diffusely TTP, soft, mildly distended, hypoactive bowel sounds MSK: moving limbs spontaneously, normal bulk and tone Derm: warm and dry, some dry scaling skin on shins Neuro: no focal deficits, AOx3 Psych: appropriate mood and affect  Labs and Imaging:  CBC    Component Value Date/Time   WBC 44.2 (H)  04/09/2017 1936   RBC 4.63 04/09/2017 1936   HGB 13.3 04/09/2017 1936   HGB 12.8 06/18/2010 1242   HCT 41.1 04/09/2017 1936   HCT 38.9 06/18/2010 1242   PLT 213 04/09/2017 1936   PLT 280 06/18/2010  1242   MCV 88.8 04/09/2017 1936   MCV 89.7 06/18/2010 1242   MCH 28.7 04/09/2017 1936   MCHC 32.4 04/09/2017 1936   RDW 15.5 04/09/2017 1936   RDW 14.6 (H) 06/18/2010 1242   LYMPHSABS 19.9 (H) 04/09/2017 1936   LYMPHSABS 8.1 (H) 06/18/2010 1242   MONOABS 3.5 (H) 04/09/2017 1936   MONOABS 0.9 06/18/2010 1242   EOSABS 0.0 04/09/2017 1936   EOSABS 0.2 06/18/2010 1242   BASOSABS 0.0 04/09/2017 1936   BASOSABS 0.0 06/18/2010 1242    CMP     Component Value Date/Time   NA 139 04/09/2017 1936   K 4.0 04/09/2017 1936   CL 102 04/09/2017 1936   CO2 28 04/09/2017 1936   GLUCOSE 144 (H) 04/09/2017 1936   BUN 10 04/09/2017 1936   CREATININE 1.13 (H) 04/09/2017 1936   CREATININE 1.20 (H) 10/29/2016 1022   CALCIUM 9.7 04/09/2017 1936   PROT 7.2 04/09/2017 1936   ALBUMIN 4.3 04/09/2017 1936   AST 19 04/09/2017 1936   ALT 11 (L) 04/09/2017 1936   ALKPHOS 97 04/09/2017 1936   BILITOT 0.8 04/09/2017 1936   GFRNONAA 48 (L) 04/09/2017 1936   GFRNONAA 45 (L) 06/04/2016 1634   GFRAA 56 (L) 04/09/2017 1936   GFRAA 52 (L) 06/04/2016 1634   Lactic Acid, Venous    Component Value Date/Time   LATICACIDVEN 1.35 04/09/2017 2000    Urinalysis    Component Value Date/Time   COLORURINE YELLOW 04/09/2017 2140   APPEARANCEUR CLEAR 04/09/2017 2140   LABSPEC 1.016 04/09/2017 2140   PHURINE 7.0 04/09/2017 2140   GLUCOSEU NEGATIVE 04/09/2017 2140   HGBUR NEGATIVE 04/09/2017 2140   HGBUR negative 06/16/2009 1336   [place] NEGATIVE 04/09/2017 2140   [place] NEGATIVE 04/09/2017 2140   PROTEINUR NEGATIVE 04/09/2017 2140   UROBILINOGEN 0.2 06/06/2015 1131   NITRITE NEGATIVE 04/09/2017 2140   LEUKOCYTESUR NEGATIVE 04/09/2017 2140     Dg Chest 2 View  Result Date: 04/10/2017 CLINICAL  DATA:  Abdominal pain.  Chills. EXAM: CHEST  2 VIEW COMPARISON:  Chest radiograph 02/ 11/2012 FINDINGS: The heart size and mediastinal contours are within normal limits. There is atherosclerotic calcification of the aortic arch Both lungs are clear. The visualized skeletal structures are unremarkable. IMPRESSION: No active cardiopulmonary disease. Electronically Signed   By: Ulyses Jarred M.D.   On: 04/10/2017 00:28   Ct Abdomen Pelvis W Contrast  Result Date: 04/09/2017 CLINICAL DATA:  Periumbilical pain and nausea today. History of CLL, COPD, bypass graft of right lower extremity and cholecystectomy. EXAM: CT ABDOMEN AND PELVIS WITH CONTRAST TECHNIQUE: Multidetector CT imaging of the abdomen and pelvis was performed using the standard protocol following bolus administration of intravenous contrast. CONTRAST:  42mL ISOVUE-300 IOPAMIDOL (ISOVUE-300) INJECTION 61% COMPARISON:  06/06/2015 CT FINDINGS: Lower chest: Normal visualized cardiac chamber size without pericardial effusion. Clear lung bases. Small stable hiatal hernia. Hepatobiliary: No focal liver abnormality is seen. Status post cholecystectomy. No biliary dilatation. Pancreas: Unremarkable. No pancreatic ductal dilatation or surrounding inflammatory changes. Spleen: Normal in size without focal abnormality. Adrenals/Urinary Tract: Adrenal glands are unremarkable. Kidneys are normal, without renal calculi, focal lesion, or hydronephrosis. Bladder is unremarkable. Stomach/Bowel: Contracted stomach. Normal small bowel rotation without obstruction. Slight fluid-filled distention of small bowel loops in the upper pelvis may reflect changes of by a mild enteritis. Moderate colonic stool burden within the ascending through transverse colon and again within the sigmoid colon to rectum. Normal appendix. Vascular/Lymphatic: Aortic atherosclerosis without aneurysm. Left patent common  iliac stent. Partially visualized patent left common femoral artery graft. Patent  right mid to distal common iliac stent. No lymphadenopathy. Reproductive: Uterus and bilateral adnexa are unremarkable. Other: No abdominal wall hernia or abnormality. No abdominopelvic ascites. Musculoskeletal: No acute or significant osseous findings. IMPRESSION: 1. Mild fluid-filled distention of small bowel loops in the upper pelvis may reflect changes of a mild enteritis. No bowel obstruction is seen. 2. Small hiatal hernia remains stable in appearance. 3. Aortic atherosclerosis with stents as above described within the common iliac arteries bilaterally and partially visualized persistent left common femoral artery graft. 4. Status post cholecystectomy. Electronically Signed   By: Ashley Royalty M.D.   On: 04/09/2017 22:24    Bufford Lope, DO 04/10/2017, 3:43 AM PGY-1, [place] Intern pager: [phone], text pages welcome  Upper Level Addendum:  I have seen and evaluated this patient along with Dr. Shawna Orleans and reviewed the above note, making necessary revisions in green.  Rogue Bussing, MD PGY-2,  [place] [facility] 04/10/2017 6:49 AM   I saw and examined this patient.  Discussed with Dr. Ola Spurr.  Agree with her documentation and management.  Briefly,  71 yo female with cramping abd pain and distention with minimal BM.  Passing gas.  Has known PVD Issues: Abd pain: Enteritis by CT.  Etiology?  Fits with cramping pain.  Causing a partial SBO type picture.  Agree with antibiotics and bowel rest.  Slowly advance diet.  Follow

## 2017-04-11 DIAGNOSIS — R509 Fever, unspecified: Secondary | ICD-10-CM | POA: Diagnosis not present

## 2017-04-11 DIAGNOSIS — D72829 Elevated white blood cell count, unspecified: Secondary | ICD-10-CM | POA: Diagnosis not present

## 2017-04-11 DIAGNOSIS — K529 Noninfective gastroenteritis and colitis, unspecified: Secondary | ICD-10-CM | POA: Diagnosis not present

## 2017-04-11 DIAGNOSIS — R109 Unspecified abdominal pain: Secondary | ICD-10-CM | POA: Diagnosis not present

## 2017-04-11 DIAGNOSIS — D72820 Lymphocytosis (symptomatic): Secondary | ICD-10-CM | POA: Diagnosis not present

## 2017-04-11 LAB — CBC WITH DIFFERENTIAL/PLATELET
Band Neutrophils: 0 %
Basophils Absolute: 0 10*3/uL (ref 0.0–0.1)
Basophils Relative: 0 %
Blasts: 0 %
Eosinophils Absolute: 0 10*3/uL (ref 0.0–0.7)
Eosinophils Relative: 0 %
HCT: 33 % — ABNORMAL LOW (ref 36.0–46.0)
Hemoglobin: 10.4 g/dL — ABNORMAL LOW (ref 12.0–15.0)
Lymphocytes Relative: 76 %
Lymphs Abs: 31.3 10*3/uL — ABNORMAL HIGH (ref 0.7–4.0)
MCH: 28.3 pg (ref 26.0–34.0)
MCHC: 31.5 g/dL (ref 30.0–36.0)
MCV: 89.7 fL (ref 78.0–100.0)
Metamyelocytes Relative: 0 %
Monocytes Absolute: 0.4 10*3/uL (ref 0.1–1.0)
Monocytes Relative: 1 %
Myelocytes: 0 %
Neutro Abs: 8.7 10*3/uL — ABNORMAL HIGH (ref 1.7–7.7)
Neutrophils Relative %: 21 %
Other: 2 %
Platelets: 181 10*3/uL (ref 150–400)
Promyelocytes Absolute: 0 %
RBC: 3.68 MIL/uL — ABNORMAL LOW (ref 3.87–5.11)
RDW: 15.6 % — ABNORMAL HIGH (ref 11.5–15.5)
WBC: 41.2 10*3/uL — ABNORMAL HIGH (ref 4.0–10.5)
nRBC: 0 /100 WBC

## 2017-04-11 LAB — GLUCOSE, CAPILLARY
Glucose-Capillary: 101 mg/dL — ABNORMAL HIGH (ref 65–99)
Glucose-Capillary: 128 mg/dL — ABNORMAL HIGH (ref 65–99)
Glucose-Capillary: 133 mg/dL — ABNORMAL HIGH (ref 65–99)
Glucose-Capillary: 141 mg/dL — ABNORMAL HIGH (ref 65–99)
Glucose-Capillary: 89 mg/dL (ref 65–99)
Glucose-Capillary: 90 mg/dL (ref 65–99)

## 2017-04-11 LAB — PATHOLOGIST SMEAR REVIEW

## 2017-04-11 LAB — HEMOGLOBIN A1C
Hgb A1c MFr Bld: 6.4 % — ABNORMAL HIGH (ref 4.8–5.6)
Mean Plasma Glucose: 137 mg/dL

## 2017-04-11 LAB — BASIC METABOLIC PANEL
Anion gap: 9 (ref 5–15)
BUN: 7 mg/dL (ref 6–20)
CO2: 28 mmol/L (ref 22–32)
Calcium: 8.2 mg/dL — ABNORMAL LOW (ref 8.9–10.3)
Chloride: 108 mmol/L (ref 101–111)
Creatinine, Ser: 1.29 mg/dL — ABNORMAL HIGH (ref 0.44–1.00)
GFR calc Af Amer: 48 mL/min — ABNORMAL LOW (ref >60)
GFR calc non Af Amer: 41 mL/min — ABNORMAL LOW (ref >60)
Glucose, Bld: 93 mg/dL (ref 65–99)
Potassium: 4.2 mmol/L (ref 3.5–5.1)
Sodium: 145 mmol/L (ref 135–145)

## 2017-04-11 MED ORDER — POLYETHYLENE GLYCOL 3350 17 G PO PACK
17.0000 g | PACK | Freq: Two times a day (BID) | ORAL | Status: DC
  Administered 2017-04-11 – 2017-04-13 (×4): 17 g via ORAL
  Filled 2017-04-11 (×6): qty 1

## 2017-04-11 MED ORDER — PANTOPRAZOLE SODIUM 20 MG PO TBEC
20.0000 mg | DELAYED_RELEASE_TABLET | Freq: Every day | ORAL | Status: DC
  Administered 2017-04-11 – 2017-04-13 (×3): 20 mg via ORAL
  Filled 2017-04-11 (×3): qty 1

## 2017-04-11 NOTE — Progress Notes (Signed)
Family Medicine Teaching Service Daily Progress Note Intern Pager: [phone]  Patient name: [name] [name] Medical record number: [id number] Date of birth: 01/29/1946 Age: 71 y.o. Gender: female  Primary Care Provider: Smiley Houseman, MD Consultants: none Code Status: Full   Pt Overview and Major Events to Date:  4/14 admitted for abdominal pain  Assessment and Plan: [name] [name] is a 71 y.o. female presenting with abdominal pain. PMH is significant for T2DM, CLL, HLD, HTN, PVD (with history of bypass grafting), COPD, CKD III, achalasia (s/p esophageal dilation in 2003), GERD.  Abdominal pain. Still afebrile but with continued leukocytosis (WBC 41.2,  Lymphocyte predominance). Patient still passing gas but no BM. - advance to soft diet - pain control: tylenol prn mild pain, tramadol 50mg  q6prn moderate pain, IV morphine 1mg  q4prn severe pain - Discontinue IV zosyn (4/14-) since patient afebrile and WBC neutrophil percentage 21% down from 47% on admission - blood cultures pending - protonix 40mg  qd - monitor I/Os - miralax BID  T2DM Last a1c 6.3 on 10/29/16. At home on metformin 500mg  BID - restart metformin when eating  HTN  At home on enalapril 20mg  qd, HCTZ 12.5mg  qd, and metoprolol 25 mg BID - continue home enalapril, HCTZ, metoprolol  HLD last lipid panel 10/29/16 chol 99, HDL 32, TG 181, LDL 31. At home on simvastatin 20mg  qd - continue home statin  CKD At baseline Cr ~1.2 - monitor  GERD at home on pepcid 20mg  qd - protonix  PVD At home on cilostazol and plavix (has aspirin listed as intolerance) - continue home medications  H/o CLL. Last seen at [facility] in 2011 but not evaluated since. Is reestablishing with oncology as outpatient. Per chart review baseline WBC appears to have been increasing from 14-16 to 20 in 2016. WBC elevated to 40s with lymphocyte predominance. - monitor CBC   FEN/GI: soft diet, protonix, miralax BID Prophylaxis:  lovenox   Disposition: pending medical management  Subjective:  States has continued crampy abdominal pain that is unchanged, rates 8/10 today, pain is not improving but also not worsening. No fever/chills this am. Has been passing gas. No nausea or vomiting. No BM  Objective: Temp:  [97.4 F (36.3 C)-98.5 F (36.9 C)] 98.5 F (36.9 C) (04/16 0634) Pulse Rate:  [62-65] 65 (04/16 0634) Resp:  [17] 17 (04/16 0634) BP: (140-155)/(60-67) 140/61 (04/16 0634) SpO2:  [96 %-100 %] 98 % (04/16 7342) Physical Exam: General: lying comfortably in bed, in NAD Cardiovascular: RRR, no murmurs Respiratory: CTAB, normal effort on room air Abdomen:  diffusely TTP, soft, mildly distended, + bowel sounds. No rebound. Extremities: no LE edema. Warm and well perfused.  Laboratory:  Recent Labs Lab 04/09/17 1936 04/10/17 0520  WBC 44.2* 42.1*  HGB 13.3 11.6*  HCT 41.1 35.7*  PLT 213 204    Recent Labs Lab 04/09/17 1936 04/10/17 0520 04/11/17 0503  NA 139 142 145  K 4.0 3.2* 4.2  CL 102 103 108  CO2 28 30 28   BUN 10 8 7   CREATININE 1.13* 1.21* 1.29*  CALCIUM 9.7 8.9 8.2*  PROT 7.2  --   --   BILITOT 0.8  --   --   ALKPHOS 97  --   --   ALT 11*  --   --   AST 19  --   --   GLUCOSE 144* 116* 93     Imaging/Diagnostic Tests: No results found.  [name] Lope, DO 04/11/2017, 7:12 AM PGY-1, [place]  [place] Intern pager: [phone], text pages welcome

## 2017-04-11 NOTE — Progress Notes (Signed)
Social Note:  [name] [name] to see [name] [name] this morning. She reports she is doing okay. Still has diffuse abdominal pain but tolerating liquid diet. Passing gas but no BM yet.  Appreciate care given by inpatient FM team  Smiley Houseman, MD PGY 2 Family Medicine.

## 2017-04-12 DIAGNOSIS — R509 Fever, unspecified: Secondary | ICD-10-CM | POA: Diagnosis not present

## 2017-04-12 DIAGNOSIS — Z8673 Personal history of transient ischemic attack (TIA), and cerebral infarction without residual deficits: Secondary | ICD-10-CM | POA: Diagnosis not present

## 2017-04-12 DIAGNOSIS — R109 Unspecified abdominal pain: Secondary | ICD-10-CM | POA: Diagnosis not present

## 2017-04-12 DIAGNOSIS — Z823 Family history of stroke: Secondary | ICD-10-CM | POA: Diagnosis not present

## 2017-04-12 DIAGNOSIS — Z8049 Family history of malignant neoplasm of other genital organs: Secondary | ICD-10-CM | POA: Diagnosis not present

## 2017-04-12 DIAGNOSIS — D72829 Elevated white blood cell count, unspecified: Secondary | ICD-10-CM | POA: Diagnosis not present

## 2017-04-12 DIAGNOSIS — E1122 Type 2 diabetes mellitus with diabetic chronic kidney disease: Secondary | ICD-10-CM | POA: Diagnosis not present

## 2017-04-12 DIAGNOSIS — M549 Dorsalgia, unspecified: Secondary | ICD-10-CM | POA: Diagnosis not present

## 2017-04-12 DIAGNOSIS — C9110 Chronic lymphocytic leukemia of B-cell type not having achieved remission: Secondary | ICD-10-CM | POA: Diagnosis not present

## 2017-04-12 DIAGNOSIS — G8929 Other chronic pain: Secondary | ICD-10-CM | POA: Diagnosis present

## 2017-04-12 DIAGNOSIS — J449 Chronic obstructive pulmonary disease, unspecified: Secondary | ICD-10-CM | POA: Diagnosis not present

## 2017-04-12 DIAGNOSIS — F17210 Nicotine dependence, cigarettes, uncomplicated: Secondary | ICD-10-CM | POA: Diagnosis present

## 2017-04-12 DIAGNOSIS — Z833 Family history of diabetes mellitus: Secondary | ICD-10-CM | POA: Diagnosis not present

## 2017-04-12 DIAGNOSIS — Z825 Family history of asthma and other chronic lower respiratory diseases: Secondary | ICD-10-CM | POA: Diagnosis not present

## 2017-04-12 DIAGNOSIS — Z23 Encounter for immunization: Secondary | ICD-10-CM | POA: Diagnosis present

## 2017-04-12 DIAGNOSIS — Z8249 Family history of ischemic heart disease and other diseases of the circulatory system: Secondary | ICD-10-CM | POA: Diagnosis not present

## 2017-04-12 DIAGNOSIS — Z841 Family history of disorders of kidney and ureter: Secondary | ICD-10-CM | POA: Diagnosis not present

## 2017-04-12 DIAGNOSIS — Z800 Family history of malignant neoplasm of digestive organs: Secondary | ICD-10-CM | POA: Diagnosis not present

## 2017-04-12 DIAGNOSIS — E785 Hyperlipidemia, unspecified: Secondary | ICD-10-CM | POA: Diagnosis not present

## 2017-04-12 DIAGNOSIS — Z9049 Acquired absence of other specified parts of digestive tract: Secondary | ICD-10-CM | POA: Diagnosis not present

## 2017-04-12 DIAGNOSIS — E1151 Type 2 diabetes mellitus with diabetic peripheral angiopathy without gangrene: Secondary | ICD-10-CM | POA: Diagnosis not present

## 2017-04-12 DIAGNOSIS — Z818 Family history of other mental and behavioral disorders: Secondary | ICD-10-CM | POA: Diagnosis not present

## 2017-04-12 DIAGNOSIS — K529 Noninfective gastroenteritis and colitis, unspecified: Secondary | ICD-10-CM | POA: Diagnosis not present

## 2017-04-12 DIAGNOSIS — Z830 Family history of human immunodeficiency virus [HIV] disease: Secondary | ICD-10-CM | POA: Diagnosis not present

## 2017-04-12 DIAGNOSIS — I129 Hypertensive chronic kidney disease with stage 1 through stage 4 chronic kidney disease, or unspecified chronic kidney disease: Secondary | ICD-10-CM | POA: Diagnosis not present

## 2017-04-12 DIAGNOSIS — Z9889 Other specified postprocedural states: Secondary | ICD-10-CM | POA: Diagnosis not present

## 2017-04-12 DIAGNOSIS — N183 Chronic kidney disease, stage 3 (moderate): Secondary | ICD-10-CM | POA: Diagnosis not present

## 2017-04-12 DIAGNOSIS — K219 Gastro-esophageal reflux disease without esophagitis: Secondary | ICD-10-CM | POA: Diagnosis not present

## 2017-04-12 LAB — BASIC METABOLIC PANEL
Anion gap: 8 (ref 5–15)
BUN: 5 mg/dL — ABNORMAL LOW (ref 6–20)
CO2: 27 mmol/L (ref 22–32)
Calcium: 9.3 mg/dL (ref 8.9–10.3)
Chloride: 106 mmol/L (ref 101–111)
Creatinine, Ser: 1.09 mg/dL — ABNORMAL HIGH (ref 0.44–1.00)
GFR calc Af Amer: 59 mL/min — ABNORMAL LOW (ref >60)
GFR calc non Af Amer: 51 mL/min — ABNORMAL LOW (ref >60)
Glucose, Bld: 117 mg/dL — ABNORMAL HIGH (ref 65–99)
Potassium: 3.8 mmol/L (ref 3.5–5.1)
Sodium: 141 mmol/L (ref 135–145)

## 2017-04-12 LAB — CBC WITH DIFFERENTIAL/PLATELET
Band Neutrophils: 0 %
Basophils Absolute: 0 10*3/uL (ref 0.0–0.1)
Basophils Relative: 0 %
Blasts: 0 %
Eosinophils Absolute: 0.3 10*3/uL (ref 0.0–0.7)
Eosinophils Relative: 1 %
HCT: 34.1 % — ABNORMAL LOW (ref 36.0–46.0)
Hemoglobin: 11 g/dL — ABNORMAL LOW (ref 12.0–15.0)
Lymphocytes Relative: 67 %
Lymphs Abs: 20.8 10*3/uL — ABNORMAL HIGH (ref 0.7–4.0)
MCH: 28.4 pg (ref 26.0–34.0)
MCHC: 32.3 g/dL (ref 30.0–36.0)
MCV: 88.1 fL (ref 78.0–100.0)
Metamyelocytes Relative: 0 %
Monocytes Absolute: 1.9 10*3/uL — ABNORMAL HIGH (ref 0.1–1.0)
Monocytes Relative: 6 %
Myelocytes: 0 %
Neutro Abs: 8.1 10*3/uL — ABNORMAL HIGH (ref 1.7–7.7)
Neutrophils Relative %: 26 %
Platelets: 182 10*3/uL (ref 150–400)
Promyelocytes Absolute: 0 %
RBC: 3.87 MIL/uL (ref 3.87–5.11)
RDW: 15.6 % — ABNORMAL HIGH (ref 11.5–15.5)
WBC: 31.1 10*3/uL — ABNORMAL HIGH (ref 4.0–10.5)
nRBC: 0 /100 WBC

## 2017-04-12 LAB — PATHOLOGIST SMEAR REVIEW

## 2017-04-12 MED ORDER — METFORMIN HCL 500 MG PO TABS
500.0000 mg | ORAL_TABLET | Freq: Two times a day (BID) | ORAL | Status: DC
  Administered 2017-04-12 – 2017-04-13 (×4): 500 mg via ORAL
  Filled 2017-04-12 (×4): qty 1

## 2017-04-12 MED ORDER — POLYETHYLENE GLYCOL 3350 17 G PO PACK: 17.0000 g | PACK | Freq: Two times a day (BID) | ORAL | 0 refills | Status: DC

## 2017-04-12 MED ORDER — ONDANSETRON HCL 4 MG PO TABS: 4.0000 mg | ORAL_TABLET | Freq: Four times a day (QID) | ORAL | 0 refills | Status: DC | PRN

## 2017-04-12 NOTE — Progress Notes (Signed)
Family Medicine Teaching Service Daily Progress Note Intern Pager: [phone]  Patient name: [name] [name] Medical record number: [id number] Date of birth: 07/08/1946 Age: 71 y.o. Gender: female  Primary Care Provider: Smiley Houseman, MD Consultants: none Code Status: Full   Pt Overview and Major Events to Date:  4/14 admitted for abdominal pain  Assessment and Plan: [name] [name] is a 71 y.o. female presenting with abdominal pain. PMH is significant for T2DM, CLL, HLD, HTN, PVD (with history of bypass grafting), COPD, CKD III, achalasia (s/p esophageal dilation in 2003), GERD.  Abdominal pain 2/2 enteritis, improved. Still afebrile and leukocytosis improving (WBC 41.2>31,  Lymphocyte predominance). Now monitoring off antibiotics. Patient had BM yesterday, tolerating po well - advance diet as tolerated  - pain control: tylenol prn mild pain, tramadol 50mg  q6prn moderate pain. Discontinue IV morphine 1mg  q4prn severe pain - blood cultures NG 1 day - protonix 40mg  qd - monitor I/Os - miralax BID  T2DM Last a1c 6.3 on 10/29/16. At home on metformin 500mg  BID - restart metformin   HTN Elevated to 160s/80s likely 2/2 pain. Per chart review has been 120s/70s as outpatient. At home on enalapril 20mg  qd, HCTZ 12.5mg  qd, and metoprolol 25 mg BID.  - continue home enalapril, HCTZ, metoprolol - monitor BP  HLD last lipid panel 10/29/16 chol 99, HDL 32, TG 181, LDL 31. At home on simvastatin 20mg  qd - continue home statin  CKD At baseline Cr ~1.2 - monitor  GERD at home on pepcid 20mg  qd - protonix  PVD At home on cilostazol and plavix (has aspirin listed as intolerance) - continue home medications  H/o CLL. Last seen at [facility] in 2011 but not evaluated since. Is reestablishing with oncology as outpatient. Per chart review baseline WBC appears to have been increasing from 14-16 to 20 in 2016. WBC elevated to 40s with lymphocyte predominance. - monitor CBC and  follow up as outpatient   FEN/GI: HH carb mod diet, protonix, miralax BID Prophylaxis: lovenox   Disposition: home today  Subjective:  Abdominal pain is much improved, only having occasional crampy pain. Eating well. Had 3 BM yesterday. No fever/chills.  Objective: Temp:  [97.8 F (36.6 C)-98.3 F (36.8 C)] 98.3 F (36.8 C) (04/17 0508) Pulse Rate:  [53-62] 54 (04/17 0508) Resp:  [16-19] 19 (04/17 0508) BP: (167-183)/(72-85) 167/78 (04/17 0637) SpO2:  [97 %-100 %] 99 % (04/17 0508) Weight:  [58.6 kg (129 lb 3 oz)] 58.6 kg (129 lb 3 oz) (04/16 2111) Physical Exam: General: lying comfortably in bed, in NAD Cardiovascular: RRR, no murmurs Respiratory: CTAB, normal effort on room air Abdomen:  Mildly TTP diffusely that is much improved from yesterday, soft, mildly distended, + bowel sounds. No rebound. Extremities: no LE edema. Warm and well perfused.  Laboratory:  Recent Labs Lab 04/10/17 0520 04/11/17 0503 04/12/17 0453  WBC 42.1* 41.2* 31.1*  HGB 11.6* 10.4* 11.0*  HCT 35.7* 33.0* 34.1*  PLT 204 181 182    Recent Labs Lab 04/09/17 1936 04/10/17 0520 04/11/17 0503 04/12/17 0453  NA 139 142 145 141  K 4.0 3.2* 4.2 3.8  CL 102 103 108 106  CO2 28 30 28 27   BUN 10 8 7  5*  CREATININE 1.13* 1.21* 1.29* 1.09*  CALCIUM 9.7 8.9 8.2* 9.3  PROT 7.2  --   --   --   BILITOT 0.8  --   --   --   ALKPHOS 97  --   --   --  ALT 11*  --   --   --   AST 19  --   --   --   GLUCOSE 144* 116* 93 117*     Imaging/Diagnostic Tests: No results found.  [name] Lope, DO 04/12/2017, 7:29 AM PGY-1, [place] Intern pager: [phone], text pages welcome

## 2017-04-12 NOTE — Care Management Obs Status (Signed)
[place] NOTIFICATION   Patient Details  Name: [name] [name] MRN: [id number] Date of Birth: [birth year]   Medicare Observation Status Notification Given:  Yes    Carles Collet, RN 04/12/2017, 3:16 PM

## 2017-04-12 NOTE — Progress Notes (Signed)
0508-Patient's BP 183/72. On call MD notified. MD advised RN to recheck BP and if systolic greater than 378, to administer 1000 BP and HCTZ early.   0531-Recheck of BP 182/76. RN will administer medications.  0637-Recheck of BP, 167/78.   Ermalinda Memos, RN

## 2017-04-12 NOTE — Care Management Note (Signed)
Case Management Note  Patient Details  Name: [name] [name] MRN: [id number] Date of Birth: 21-Jan-1946  Subjective/Objective:                 Patient states she and husband stay in motel. Patient in obs for abd pain. CM spoke with resident this AM, anticipated DC today. No CM needs identified.    Action/Plan:  Anticipate DC to home.  Expected Discharge Date:  04/12/17               Expected Discharge Plan:  Home/Self Care  In-House Referral:     Discharge planning Services  CM Consult  Post Acute Care Choice:    Choice offered to:     DME Arranged:    DME Agency:     HH Arranged:    HH Agency:     Status of Service:  In process, will continue to follow  If discussed at Long Length of Stay Meetings, dates discussed:    Additional Comments:  Carles Collet, RN 04/12/2017, 5:03 PM

## 2017-04-12 NOTE — Discharge Summary (Addendum)
[facility] Discharge Summary  Patient name: [name] [name] Medical record number: [id number] Date of birth: 06-14-1946 Age: 71 y.o. Gender: female Date of Admission: 04/09/2017  Date of Discharge: 04/13/17 Admitting Physician: Zenia Resides, MD  Primary Care Provider: Smiley Houseman, MD Consultants: none  Indication for Hospitalization: abdominal pain  Discharge Diagnoses/Problem List:  Abdominal pain 2/2 enteritis T2DM HTN HLD CKD GERD PVD CLL  Disposition: home  Discharge Condition: Stable, improved  Discharge Exam:  Temp:  [98.1 F (36.7 C)-98.5 F (36.9 C)] 98.4 F (36.9 C) (04/18 0450) Pulse Rate:  [57-61] 58 (04/18 0450) Resp:  [14-17] 16 (04/18 0450) BP: (146-179)/(61-91) 146/61 (04/18 0450) SpO2:  [96 %-100 %] 99 % (04/18 0719) Weight:  [127 lb (57.6 kg)] 127 lb (57.6 kg) (04/17 2012) Physical Exam: General: lying comfortably in bed,in NAD Cardiovascular: RRR, no murmurs Respiratory: CTAB, normal effort on room air Abdomen:  Mildly TTP diffusely, soft, mildly distended, + bowel sounds. No rebound. Extremities: no LE edema. Warm and well perfused.   Brief Hospital Course:  [name] Kludt Shiversis a 71 y.o.femalewith PMH significant for T2DM, CLL, HLD, HTN, PVD (with history of bypass grafting), COPD, CKD III, achalasia (s/p esophageal dilation in 2003), GERD who presented with abdominal pain.   Patient had intermittent sharp cramping abdominal pain, no flatus/BM for several days, fever Tmax 101.67F, and leukocytosis WBC 44.2 (equal neutrophil and lymphocyte percentages). CT abdomen showing mild fluid-filled distention of small bowel loops c/w mild enteritis but no bowel obstruction. She met 2/4 SIRS criteria and was therefore started on IV zosyn for possibly intraabdominal infection. However patient was well appearing and remained well appearing throughout her hospital stay so the IV zosyn was discontinued, she was monitored off  antibiotics and continued to do well. Her abdominal pain improved with bowel rest and she started passing flatus/BM with the initiation of a bowel regimen. On day of discharge she had stable vital signs, was tolerating po well and had improvement of her abdominal pain so was deemed stable for discharge home.  Of note, patient's WBC remained elevated in high 30s-low 40s with lymphocyte predominance. She has not been seen or treated for CLL since 2011 and needs outpatient follow up.  Issues for Follow Up:  1. Please follow up on abdominal pain and po intake in the setting of resolving enteritis 2. Patient needs hematology/oncology follow up for CLL, per chart review referral made earlier this month by PCP.  Significant Procedures: none  Significant Labs and Imaging:   Recent Labs Lab 04/11/17 0503 04/12/17 0453 04/13/17 0745  WBC 41.2* 31.1* 35.2*  HGB 10.4* 11.0* 11.5*  HCT 33.0* 34.1* 35.9*  PLT 181 182 189    Recent Labs Lab 04/09/17 1936 04/10/17 0520 04/11/17 0503 04/12/17 0453 04/13/17 0745  NA 139 142 145 141 140  K 4.0 3.2* 4.2 3.8 3.4*  CL 102 103 108 106 103  CO2 '28 30 28 27 27  ' GLUCOSE 144* 116* 93 117* 105*  BUN '10 8 7 ' 5* 6  CREATININE 1.13* 1.21* 1.29* 1.09* 1.16*  CALCIUM 9.7 8.9 8.2* 9.3 9.6  ALKPHOS 97  --   --   --   --   AST 19  --   --   --   --   ALT 11*  --   --   --   --   ALBUMIN 4.3  --   --   --   --  Results/Tests Pending at Time of Discharge: none  Discharge Medications:  Allergies as of 04/13/2017      Reactions   Aspirin Other (See Comments)   irritates stomach   Baclofen Other (See Comments)   Stomach irritation      Medication List    TAKE these medications   albuterol 108 (90 Base) MCG/ACT inhaler Commonly known as:  PROVENTIL HFA;VENTOLIN HFA Inhale 2 puffs into the lungs every 4 (four) hours as needed for wheezing or shortness of breath.   cilostazol 50 MG tablet Commonly known as:  PLETAL Take 1 tablet (50 mg total)  by mouth 2 (two) times daily.   clopidogrel 75 MG tablet Commonly known as:  PLAVIX Take 1 tablet (75 mg total) by mouth daily.   cyclobenzaprine 10 MG tablet Commonly known as:  FLEXERIL Take 1 tablet (10 mg total) by mouth daily as needed for muscle spasms.   enalapril 20 MG tablet Commonly known as:  VASOTEC Take 1 tablet (20 mg total) by mouth daily.   famotidine 20 MG tablet Commonly known as:  PEPCID Take 1 tablet (20 mg total) by mouth daily.   Fluticasone-Salmeterol 250-50 MCG/DOSE Aepb Commonly known as:  ADVAIR DISKUS Inhale 1 puff into the lungs 2 (two) times daily.   hydrochlorothiazide 12.5 MG capsule Commonly known as:  MICROZIDE Take 1 capsule (12.5 mg total) by mouth daily.   loratadine 10 MG tablet Commonly known as:  CLARITIN Take 1 tablet (10 mg total) by mouth daily.   metFORMIN 500 MG tablet Commonly known as:  GLUCOPHAGE Take 1 tablet (500 mg total) by mouth 2 (two) times daily with a meal.   metoprolol tartrate 25 MG tablet Commonly known as:  LOPRESSOR Take 1 tablet (25 mg total) by mouth 2 (two) times daily.   montelukast 10 MG tablet Commonly known as:  SINGULAIR Take 1 tablet (10 mg total) by mouth at bedtime.   nitroGLYCERIN 0.4 MG SL tablet Commonly known as:  NITROSTAT Place 1 tablet (0.4 mg total) under the tongue every 5 (five) minutes as needed for chest pain. Only take up to 3 tablets.   ondansetron 4 MG tablet Commonly known as:  ZOFRAN Take 1 tablet (4 mg total) by mouth every 6 (six) hours as needed for nausea.   polyethylene glycol packet Commonly known as:  MIRALAX / GLYCOLAX Take 17 g by mouth 2 (two) times daily.   simvastatin 20 MG tablet Commonly known as:  ZOCOR Take 1 tablet (20 mg total) by mouth at bedtime.   traMADol 50 MG tablet Commonly known as:  ULTRAM Take 1 tablet (50 mg total) by mouth daily as needed (For pain.). Reported on 01/01/2016       Discharge Instructions: Please refer to Patient  Instructions section of EMR for full details.  Patient was counseled important signs and symptoms that should prompt return to medical care, changes in medications, dietary instructions, activity restrictions, and follow up appointments.   Follow-Up Appointments: Follow-up Information    Smiley Houseman, MD. Go on 04/15/2017.   Specialty:  Family Medicine Why:  Please go to your hospital follow up appointment, arrive at 2pm. Contact information: [place] [address] [place], DO 04/13/2017, 6:38 PM PGY-1, [place]  I saw and examined this patient on the day of DC.  Agree with the documentation and management of Dr. Shawna Orleans

## 2017-04-12 NOTE — Discharge Instructions (Signed)
It has been a pleasure taking care of you! You were admitted due to abdominal pain that improved with good pain control and time. With that your symptoms improved to the point we think it is safe to let you go home and follow up with your primary care doctor. You should continue taking over the counter miralax at home and over the counter tylenol for pain with your home tramadol for breakthrough pain. We have prescribed you zofran for nausea to use as needed. Please try to eat soft and bland foods at home and slowly increase the amount you eat taking it easy on your stomach.  There could be some changes made to your home medications during this hospitalization. Please, make sure to read the directions before you take them. The names and directions on how to take these medications are found on this discharge paper under medication section.  Please follow up at your primary care doctor's office. The address, date and time are found on the discharge paper under follow up section.

## 2017-04-13 ENCOUNTER — Encounter (HOSPITAL_COMMUNITY): Payer: Self-pay

## 2017-04-13 LAB — CBC WITH DIFFERENTIAL/PLATELET
Band Neutrophils: 0 %
Basophils Absolute: 0 10*3/uL (ref 0.0–0.1)
Basophils Relative: 0 %
Blasts: 0 %
Eosinophils Absolute: 0 10*3/uL (ref 0.0–0.7)
Eosinophils Relative: 0 %
HCT: 35.9 % — ABNORMAL LOW (ref 36.0–46.0)
Hemoglobin: 11.5 g/dL — ABNORMAL LOW (ref 12.0–15.0)
Lymphocytes Relative: 80 %
Lymphs Abs: 28.2 10*3/uL — ABNORMAL HIGH (ref 0.7–4.0)
MCH: 28 pg (ref 26.0–34.0)
MCHC: 32 g/dL (ref 30.0–36.0)
MCV: 87.3 fL (ref 78.0–100.0)
Metamyelocytes Relative: 0 %
Monocytes Absolute: 0.7 10*3/uL (ref 0.1–1.0)
Monocytes Relative: 2 %
Myelocytes: 0 %
Neutro Abs: 6.3 10*3/uL (ref 1.7–7.7)
Neutrophils Relative %: 18 %
Other: 0 %
Platelets: 189 10*3/uL (ref 150–400)
Promyelocytes Absolute: 0 %
RBC: 4.11 MIL/uL (ref 3.87–5.11)
RDW: 15 % (ref 11.5–15.5)
WBC: 35.2 10*3/uL — ABNORMAL HIGH (ref 4.0–10.5)
nRBC: 0 /100 WBC

## 2017-04-13 LAB — BASIC METABOLIC PANEL
Anion gap: 10 (ref 5–15)
BUN: 6 mg/dL (ref 6–20)
CO2: 27 mmol/L (ref 22–32)
Calcium: 9.6 mg/dL (ref 8.9–10.3)
Chloride: 103 mmol/L (ref 101–111)
Creatinine, Ser: 1.16 mg/dL — ABNORMAL HIGH (ref 0.44–1.00)
GFR calc Af Amer: 54 mL/min — ABNORMAL LOW (ref >60)
GFR calc non Af Amer: 47 mL/min — ABNORMAL LOW (ref >60)
Glucose, Bld: 105 mg/dL — ABNORMAL HIGH (ref 65–99)
Potassium: 3.4 mmol/L — ABNORMAL LOW (ref 3.5–5.1)
Sodium: 140 mmol/L (ref 135–145)

## 2017-04-13 MED ORDER — ONDANSETRON HCL 4 MG PO TABS: 4.0000 mg | ORAL_TABLET | Freq: Four times a day (QID) | ORAL | 0 refills | Status: DC | PRN

## 2017-04-13 MED ORDER — FAMOTIDINE 20 MG PO TABS
20.0000 mg | ORAL_TABLET | Freq: Every day | ORAL | Status: DC
  Administered 2017-04-13: 20 mg via ORAL
  Filled 2017-04-13: qty 1

## 2017-04-13 NOTE — Progress Notes (Signed)
Family Medicine Teaching Service Daily Progress Note Intern Pager: [phone]  Patient name: [name] [name] Medical record number: [id number] Date of birth: 1946/02/16 Age: 71 y.o. Gender: female  Primary Care Provider: Smiley Houseman, MD Consultants: none Code Status: Full   Pt Overview and Major Events to Date:  4/14 admitted for abdominal pain  Assessment and Plan: [name] [name] is a 71 y.o. female presenting with abdominal pain. PMH is significant for T2DM, CLL, HLD, HTN, PVD (with history of bypass grafting), COPD, CKD III, achalasia (s/p esophageal dilation in 2003), GERD.  Abdominal pain 2/2 enteritis, improved. Still afebrile and leukocytosis improving (WBC 41.2>31,  Lymphocyte predominance). Now monitoring off antibiotics. Patient did not tolerate regular lunch or dinner yesterday. - Placed back on clear liquid diet yesterday, advance diet as tolerated today. If tolerates food today will DC home. - pain control: tylenol prn mild pain, tramadol 50mg  q6prn moderate pain.  - blood cultures NG 2 days - protonix 40mg  qd - monitor I/Os - miralax BID  T2DM Last a1c 6.3 on 10/29/16. At home on metformin 500mg  BID - continue metformin   HTN Elevated to 160s/80s likely 2/2 pain. Per chart review has been 120s/70s as outpatient. At home on enalapril 20mg  qd, HCTZ 12.5mg  qd, and metoprolol 25 mg BID.  - continue home enalapril, HCTZ, metoprolol - monitor BP  HLD last lipid panel 10/29/16 chol 99, HDL 32, TG 181, LDL 31. At home on simvastatin 20mg  qd - continue home statin  CKD At baseline Cr ~1.2 - monitor  GERD at home on pepcid 20mg  qd - protonix  PVD At home on cilostazol and plavix (has aspirin listed as intolerance) - continue home medications  H/o CLL. Last seen at [facility] in 2011 but not evaluated since. Is reestablishing with oncology as outpatient. Per chart review baseline WBC appears to have been increasing from 14-16 to 20 in 2016. WBC  elevated to 40s with lymphocyte predominance. - monitor CBC and follow up as outpatient   FEN/GI: soft diet w/ADAT, protonix, miralax BID Prophylaxis: lovenox   Disposition: home today  Subjective:  Abdominal pain is much improved, only having occasional crampy pain. Vomited multiple times yesterday evening, this morning feeling hungry. No fever/chills.  Objective: Temp:  [98.1 F (36.7 C)-98.5 F (36.9 C)] 98.4 F (36.9 C) (04/18 0450) Pulse Rate:  [57-61] 58 (04/18 0450) Resp:  [14-17] 16 (04/18 0450) BP: (146-179)/(61-91) 146/61 (04/18 0450) SpO2:  [96 %-100 %] 99 % (04/18 0719) Weight:  [127 lb (57.6 kg)] 127 lb (57.6 kg) (04/17 2012) Physical Exam: General: lying comfortably in bed, in NAD Cardiovascular: RRR, no murmurs Respiratory: CTAB, normal effort on room air Abdomen:  Mildly TTP diffusely, soft, mildly distended, + bowel sounds. No rebound. Extremities: no LE edema. Warm and well perfused.  Laboratory:  Recent Labs Lab 04/10/17 0520 04/11/17 0503 04/12/17 0453  WBC 42.1* 41.2* 31.1*  HGB 11.6* 10.4* 11.0*  HCT 35.7* 33.0* 34.1*  PLT 204 181 182    Recent Labs Lab 04/09/17 1936 04/10/17 0520 04/11/17 0503 04/12/17 0453  NA 139 142 145 141  K 4.0 3.2* 4.2 3.8  CL 102 103 108 106  CO2 28 30 28 27   BUN 10 8 7  5*  CREATININE 1.13* 1.21* 1.29* 1.09*  CALCIUM 9.7 8.9 8.2* 9.3  PROT 7.2  --   --   --   BILITOT 0.8  --   --   --   ALKPHOS 97  --   --   --  ALT 11*  --   --   --   AST 19  --   --   --   GLUCOSE 144* 116* 93 117*     Imaging/Diagnostic Tests: No results found.  Bufford Lope, DO 04/13/2017, 7:26 AM PGY-1, [place] Intern pager: [phone], text pages welcome

## 2017-04-15 ENCOUNTER — Ambulatory Visit (INDEPENDENT_AMBULATORY_CARE_PROVIDER_SITE_OTHER): Payer: Medicare HMO | Admitting: Internal Medicine

## 2017-04-15 ENCOUNTER — Encounter: Payer: Self-pay | Admitting: Internal Medicine

## 2017-04-15 VITALS — BP 146/66 | HR 69 | Temp 98.7°F | Wt 129.0 lb

## 2017-04-15 DIAGNOSIS — K529 Noninfective gastroenteritis and colitis, unspecified: Secondary | ICD-10-CM

## 2017-04-15 DIAGNOSIS — Z09 Encounter for follow-up examination after completed treatment for conditions other than malignant neoplasm: Secondary | ICD-10-CM | POA: Diagnosis not present

## 2017-04-15 NOTE — Patient Instructions (Addendum)
Please return to clinic if your symptoms worsen.  Continue to use miralax. Let me know if you do not have a bowel movement despite using this medication Please follow up in a month

## 2017-04-15 NOTE — Progress Notes (Signed)
   [facility] Phone: [phone]   Date of Visit: 04/15/2017   HPI:  Patient is here for hospital follow up. Patient was hospitalized from 4/14 to 4/18 for abdominal pain/n/v thought to be due to enteritis.  - doing slightly better since discharge. Abdominal pain is slowly improving. Has not had nausea since day of discharge. No emesis. Tolerating soft diet. Reports she has not had a BM since 18th. She usually has a BM daily. She started taking Miralax yesterday. Is passing gas.   ROS: See HPI.  [place]:  PMH:  HTN PVD with bypass grafts and stent DM2 HLD COPD Bradycardia CLL  PHYSICAL EXAM: BP (!) 146/66   Pulse 69   Temp 98.7 F (37.1 C) (Oral)   Wt 129 lb (58.5 kg)   SpO2 99%   BMI 23.59 kg/m  GEN: NAD HEENT: Atraumatic, normocephalic, neck supple, EOMI, sclera clear  CV: RRR, no murmurs, rubs, or gallops PULM: CTAB, normal effort ABD: Soft, some TTP diffusely without rebound or guarding, nondistended, NABS, SKIN: No rash or cyanosis; warm and well-perfused EXTR: No lower extremity edema or calf tenderness PSYCH: Mood and affect euthymic, normal rate and volume of speech NEURO: Awake, alert, no focal deficits grossly, normal speech;  ASSESSMENT/PLAN:  Hospital Follow up for Enteritis: symptoms slowly improving. Return precautions discussed. Follow up in 1 month for chronic medical problems.    Smiley Houseman, MD PGY [place]

## 2017-04-16 LAB — CULTURE, BLOOD (ROUTINE X 2)
Culture: NO GROWTH
Culture: NO GROWTH
Special Requests: ADEQUATE

## 2017-05-16 ENCOUNTER — Encounter: Payer: Self-pay | Admitting: Internal Medicine

## 2017-05-16 ENCOUNTER — Ambulatory Visit (INDEPENDENT_AMBULATORY_CARE_PROVIDER_SITE_OTHER): Payer: Medicare HMO | Admitting: Internal Medicine

## 2017-05-16 VITALS — BP 140/80 | HR 65 | Temp 98.3°F | Ht 62.0 in | Wt 127.0 lb

## 2017-05-16 DIAGNOSIS — I10 Essential (primary) hypertension: Secondary | ICD-10-CM

## 2017-05-16 DIAGNOSIS — C9110 Chronic lymphocytic leukemia of B-cell type not having achieved remission: Secondary | ICD-10-CM

## 2017-05-16 DIAGNOSIS — C9190 Lymphoid leukemia, unspecified not having achieved remission: Secondary | ICD-10-CM

## 2017-05-16 MED ORDER — CYCLOBENZAPRINE HCL 10 MG PO TABS: 10.0000 mg | ORAL_TABLET | Freq: Every day | ORAL | 0 refills | Status: DC | PRN

## 2017-05-16 MED ORDER — TRAMADOL HCL 50 MG PO TABS: 50.0000 mg | ORAL_TABLET | Freq: Every day | ORAL | 0 refills | Status: DC | PRN

## 2017-05-16 NOTE — Progress Notes (Signed)
   [facility] Phone: [phone]   Date of Visit: 05/16/2017   HPI:  HTN:  - Medications: Enalapril 20mg  daily, HCTZ 12.5mg  daily, Lopressor 25mg   - taking medications and is compliant  - denies chest pain, shortness of breath,blurred vision - her friend who is a nurse has been checking her BP intermittently:  May 7th: 165/70 HR 65 May 10th: 170/68 HR 70 May 14th 170/100 HR 78 May 17th 150/70 HR 72 May 19th 146/63 HR 72 May 20th 138/62 HR 63 May 15th 168/90 HR 76  Health Maintenance: - patient has many health maintenance blood ork/imaging but wants to wait until she can get in touch with her prior PCP. Reports her sister has an appointment at the same clinic this week and she will inquire about patient's records. Patient to call me.   CLL:  I have made multiple referrals to oncology for this patient. She reports that due to financial difficulties, she has not been able to keep her appointment. She reports that no on has called her to set up an appointment recently (new referral made in 03/2017). Per chart review, there is a note which reports that scheduling team has had a difficult time getting in contact with patient, and additionally cannot find notes from prior oncologist.   ROS: See HPI.  [place]:  PMH:  HTN PVD with bypass grafts and stent DM2 HLD COPD Bradycardia CLL  PHYSICAL EXAM: BP 140/80   Pulse 65   Temp 98.3 F (36.8 C) (Oral)   Ht 5\' 2"  (1.575 m)   Wt 127 lb (57.6 kg)   SpO2 97%   BMI 23.23 kg/m  GEN: NAD CV: RRR, no murmurs, rubs, or gallops PULM: CTAB, normal effort SKIN: No rash or cyanosis; warm and well-perfused EXTR: No lower extremity edema or calf tenderness PSYCH: Mood and affect euthymic, normal rate and volume of speech NEURO: Awake, alert, no focal deficits grossly, normal speech  ASSESSMENT/PLAN:  Health maintenance:  - patient has many health maintenance blood ork/imaging but wants to wait until she can get  in touch with her prior PCP. Reports her sister has an appointment at the same clinic this week and she will inquire about patient's records. Patient to call me.   HYPERTENSION, BENIGN SYSTEMIC BP at goal in clinic but has higher SBPs at home readings. Hesitant to increase dose of medication as her diastolic BP is low-normal. Counseled on low salt diet. Monitor at home and call with blood pressures.   Chronic lymphocytic leukemia ([place]) New referral made. Called the Heme/onc clinic and spoke with scheduler to explain about patient's financial difficulties. Called patient after clinic to call scheduler. Dicussed the importance of seeing specialist as it does not seem she has been seen by oncology in years.    Smiley Houseman, MD PGY [place]

## 2017-05-16 NOTE — Patient Instructions (Addendum)
Please continue to check your blood pressure and call me with the numbers in 1 week.  Please try to get your records Please call our clinic if you do not hear from the oncologist in about 1.5 weeks.  Set up your MyChart   DASH Eating Plan DASH stands for "Dietary Approaches to Stop Hypertension." The DASH eating plan is a healthy eating plan that has been shown to reduce high blood pressure (hypertension). It may also reduce your risk for type 2 diabetes, heart disease, and stroke. The DASH eating plan may also help with weight loss. What are tips for following this plan? General guidelines   Avoid eating more than 2,300 mg (milligrams) of salt (sodium) a day. If you have hypertension, you may need to reduce your sodium intake to 1,500 mg a day.  Limit alcohol intake to no more than 1 drink a day for nonpregnant women and 2 drinks a day for men. One drink equals 12 oz of beer, 5 oz of wine, or 1 oz of hard liquor.  Work with your health care provider to maintain a healthy body weight or to lose weight. Ask what an ideal weight is for you.  Get at least 30 minutes of exercise that causes your heart to beat faster (aerobic exercise) most days of the week. Activities may include walking, swimming, or biking.  Work with your health care provider or diet and nutrition specialist (dietitian) to adjust your eating plan to your individual calorie needs. Reading food labels   Check food labels for the amount of sodium per serving. Choose foods with less than 5 percent of the Daily Value of sodium. Generally, foods with less than 300 mg of sodium per serving fit into this eating plan.  To find whole grains, look for the word "whole" as the first word in the ingredient list. Shopping   Buy products labeled as "low-sodium" or "no salt added."  Buy fresh foods. Avoid canned foods and premade or frozen meals. Cooking   Avoid adding salt when cooking. Use salt-free seasonings or herbs instead of  table salt or sea salt. Check with your health care provider or pharmacist before using salt substitutes.  Do not fry foods. Cook foods using healthy methods such as baking, boiling, grilling, and broiling instead.  Cook with heart-healthy oils, such as olive, canola, soybean, or sunflower oil. Meal planning    Eat a balanced diet that includes:  5 or more servings of fruits and vegetables each day. At each meal, try to fill half of your plate with fruits and vegetables.  Up to 6-8 servings of whole grains each day.  Less than 6 oz of lean meat, poultry, or fish each day. A 3-oz serving of meat is about the same size as a deck of cards. One egg equals 1 oz.  2 servings of low-fat dairy each day.  A serving of nuts, seeds, or beans 5 times each week.  Heart-healthy fats. Healthy fats called Omega-3 fatty acids are found in foods such as flaxseeds and coldwater fish, like sardines, salmon, and mackerel.  Limit how much you eat of the following:  Canned or prepackaged foods.  Food that is high in trans fat, such as fried foods.  Food that is high in saturated fat, such as fatty meat.  Sweets, desserts, sugary drinks, and other foods with added sugar.  Full-fat dairy products.  Do not salt foods before eating.  Try to eat at least 2 vegetarian meals each week.  Eat more home-cooked food and less restaurant, buffet, and fast food.  When eating at a restaurant, ask that your food be prepared with less salt or no salt, if possible. What foods are recommended? The items listed may not be a complete list. Talk with your dietitian about what dietary choices are best for you. Grains  Whole-grain or whole-wheat bread. Whole-grain or whole-wheat pasta. Brown rice. [place]. Bulgur. Whole-grain and low-sodium cereals. Pita bread. Low-fat, low-sodium crackers. Whole-wheat flour tortillas. Vegetables  Fresh or frozen vegetables (raw, steamed, roasted, or grilled). Low-sodium or  reduced-sodium tomato and vegetable juice. Low-sodium or reduced-sodium tomato sauce and tomato paste. Low-sodium or reduced-sodium canned vegetables. Fruits  All fresh, dried, or frozen fruit. Canned fruit in natural juice (without added sugar). Meat and other protein foods  Skinless chicken or [place]. Ground chicken or [place]. Pork with fat trimmed off. Fish and seafood. Egg whites. Dried beans, peas, or lentils. Unsalted nuts, nut butters, and seeds. Unsalted canned beans. Lean cuts of beef with fat trimmed off. Low-sodium, lean deli meat. Dairy  Low-fat (1%) or fat-free (skim) milk. Fat-free, low-fat, or reduced-fat cheeses. Nonfat, low-sodium ricotta or cottage cheese. Low-fat or nonfat yogurt. Low-fat, low-sodium cheese. Fats and oils  Soft margarine without trans fats. Vegetable oil. Low-fat, reduced-fat, or light mayonnaise and salad dressings (reduced-sodium). Canola, safflower, olive, soybean, and sunflower oils. Avocado. Seasoning and other foods  Herbs. Spices. Seasoning mixes without salt. Unsalted popcorn and pretzels. Fat-free sweets. What foods are not recommended? The items listed may not be a complete list. Talk with your dietitian about what dietary choices are best for you. Grains  Baked goods made with fat, such as croissants, muffins, or some breads. Dry pasta or rice meal packs. Vegetables  Creamed or fried vegetables. Vegetables in a cheese sauce. Regular canned vegetables (not low-sodium or reduced-sodium). Regular canned tomato sauce and paste (not low-sodium or reduced-sodium). Regular tomato and vegetable juice (not low-sodium or reduced-sodium). Angie Fava. Olives. Fruits  Canned fruit in a light or heavy syrup. Fried fruit. Fruit in cream or butter sauce. Meat and other protein foods  Fatty cuts of meat. Ribs. Fried meat. Berniece Salines. Sausage. Bologna and other processed lunch meats. Salami. Fatback. Hotdogs. Bratwurst. Salted nuts and seeds. Canned beans with added salt.  Canned or smoked fish. Whole eggs or egg yolks. Chicken or [place] with skin. Dairy  Whole or 2% milk, cream, and half-and-half. Whole or full-fat cream cheese. Whole-fat or sweetened yogurt. Full-fat cheese. Nondairy creamers. Whipped toppings. Processed cheese and cheese spreads. Fats and oils  Butter. Stick margarine. Lard. Shortening. Ghee. Bacon fat. Tropical oils, such as coconut, palm kernel, or palm oil. Seasoning and other foods  Salted popcorn and pretzels. Onion salt, garlic salt, seasoned salt, table salt, and sea salt. Worcestershire sauce. Tartar sauce. Barbecue sauce. Teriyaki sauce. Soy sauce, including reduced-sodium. Steak sauce. Canned and packaged gravies. Fish sauce. Oyster sauce. Cocktail sauce. Horseradish that you find on the shelf. Ketchup. Mustard. Meat flavorings and tenderizers. Bouillon cubes. Hot sauce and Tabasco sauce. Premade or packaged marinades. Premade or packaged taco seasonings. Relishes. Regular salad dressings. Where to find more information:  National Heart, Lung, and [place]: https://wilson-eaton.com/  American Heart Association: www.heart.org Summary  The DASH eating plan is a healthy eating plan that has been shown to reduce high blood pressure (hypertension). It may also reduce your risk for type 2 diabetes, heart disease, and stroke.  With the DASH eating plan, you should limit salt (sodium) intake to 2,300 mg  a day. If you have hypertension, you may need to reduce your sodium intake to 1,500 mg a day.  When on the DASH eating plan, aim to eat more fresh fruits and vegetables, whole grains, lean proteins, low-fat dairy, and heart-healthy fats.  Work with your health care provider or diet and nutrition specialist (dietitian) to adjust your eating plan to your individual calorie needs. This information is not intended to replace advice given to you by your health care provider. Make sure you discuss any questions you have with your health care  provider. Document Released: 12/02/2011 Document Revised: 12/06/2016 Document Reviewed: 12/06/2016 Elsevier Interactive Patient Education  2017 [place].   Low-Sodium Eating Plan Sodium, which is an element that makes up salt, helps you maintain a healthy balance of fluids in your body. Too much sodium can increase your blood pressure and cause fluid and waste to be held in your body. Your health care provider or dietitian may recommend following this plan if you have high blood pressure (hypertension), kidney disease, liver disease, or heart failure. Eating less sodium can help lower your blood pressure, reduce swelling, and protect your heart, liver, and kidneys. What are tips for following this plan? General guidelines   Most people on this plan should limit their sodium intake to 1,500-2,000 mg (milligrams) of sodium each day. Reading food labels   The Nutrition Facts label lists the amount of sodium in one serving of the food. If you eat more than one serving, you must multiply the listed amount of sodium by the number of servings.  Choose foods with less than 140 mg of sodium per serving.  Avoid foods with 300 mg of sodium or more per serving. Shopping   Look for lower-sodium products, often labeled as "low-sodium" or "no salt added."  Always check the sodium content even if foods are labeled as "unsalted" or "no salt added".  Buy fresh foods.  Avoid canned foods and premade or frozen meals.  Avoid canned, cured, or processed meats  Buy breads that have less than 80 mg of sodium per slice. Cooking   Eat more home-cooked food and less restaurant, buffet, and fast food.  Avoid adding salt when cooking. Use salt-free seasonings or herbs instead of table salt or sea salt. Check with your health care provider or pharmacist before using salt substitutes.  Cook with plant-based oils, such as canola, sunflower, or olive oil. Meal planning   When eating at a restaurant, ask  that your food be prepared with less salt or no salt, if possible.  Avoid foods that contain MSG (monosodium glutamate). MSG is sometimes added to [place] food, bouillon, and some canned foods. What foods are recommended? The items listed may not be a complete list. Talk with your dietitian about what dietary choices are best for you. Grains  Low-sodium cereals, including oats, puffed wheat and rice, and shredded wheat. Low-sodium crackers. Unsalted rice. Unsalted pasta. Low-sodium bread. Whole-grain breads and whole-grain pasta. Vegetables  Fresh or frozen vegetables. "No salt added" canned vegetables. "No salt added" tomato sauce and paste. Low-sodium or reduced-sodium tomato and vegetable juice. Fruits  Fresh, frozen, or canned fruit. Fruit juice. Meats and other protein foods  Fresh or frozen (no salt added) meat, poultry, seafood, and fish. Low-sodium canned tuna and salmon. Unsalted nuts. Dried peas, beans, and lentils without added salt. Unsalted canned beans. Eggs. Unsalted nut butters. Dairy  Milk. Soy milk. Cheese that is naturally low in sodium, such as ricotta cheese, fresh mozzarella,  or Swiss cheese Low-sodium or reduced-sodium cheese. Cream cheese. Yogurt. Fats and oils  Unsalted butter. Unsalted margarine with no trans fat. Vegetable oils such as canola or olive oils. Seasonings and other foods  Fresh and dried herbs and spices. Salt-free seasonings. Low-sodium mustard and ketchup. Sodium-free salad dressing. Sodium-free light mayonnaise. Fresh or refrigerated horseradish. Lemon juice. Vinegar. Homemade, reduced-sodium, or low-sodium soups. Unsalted popcorn and pretzels. Low-salt or salt-free chips. What foods are not recommended? The items listed may not be a complete list. Talk with your dietitian about what dietary choices are best for you. Grains  Instant hot cereals. Bread stuffing, pancake, and biscuit mixes. Croutons. Seasoned rice or pasta mixes. Noodle soup cups. Boxed  or frozen macaroni and cheese. Regular salted crackers. Self-rising flour. Vegetables  Sauerkraut, pickled vegetables, and relishes. Olives. [place] fries. Onion rings. Regular canned vegetables (not low-sodium or reduced-sodium). Regular canned tomato sauce and paste (not low-sodium or reduced-sodium). Regular tomato and vegetable juice (not low-sodium or reduced-sodium). Frozen vegetables in sauces. Meats and other protein foods  Meat or fish that is salted, canned, smoked, spiced, or pickled. Bacon, ham, sausage, hotdogs, corned beef, chipped beef, packaged lunch meats, salt pork, jerky, pickled herring, anchovies, regular canned tuna, sardines, salted nuts. Dairy  Processed cheese and cheese spreads. Cheese curds. Blue cheese. Feta cheese. String cheese. Regular cottage cheese. Buttermilk. Canned milk. Fats and oils  Salted butter. Regular margarine. Ghee. Bacon fat. Seasonings and other foods  Onion salt, garlic salt, seasoned salt, table salt, and sea salt. Canned and packaged gravies. Worcestershire sauce. Tartar sauce. Barbecue sauce. Teriyaki sauce. Soy sauce, including reduced-sodium. Steak sauce. Fish sauce. Oyster sauce. Cocktail sauce. Horseradish that you find on the shelf. Regular ketchup and mustard. Meat flavorings and tenderizers. Bouillon cubes. Hot sauce and Tabasco sauce. Premade or packaged marinades. Premade or packaged taco seasonings. Relishes. Regular salad dressings. Salsa. Potato and tortilla chips. Corn chips and puffs. Salted popcorn and pretzels. Canned or dried soups. Pizza. Frozen entrees and pot pies. Summary  Eating less sodium can help lower your blood pressure, reduce swelling, and protect your heart, liver, and kidneys.  Most people on this plan should limit their sodium intake to 1,500-2,000 mg (milligrams) of sodium each day.  Canned, boxed, and frozen foods are high in sodium. Restaurant foods, fast foods, and pizza are also very high in sodium. You also get  sodium by adding salt to food.  Try to cook at home, eat more fresh fruits and vegetables, and eat less fast food, canned, processed, or prepared foods. This information is not intended to replace advice given to you by your health care provider. Make sure you discuss any questions you have with your health care provider. Document Released: 06/04/2002 Document Revised: 12/06/2016 Document Reviewed: 12/06/2016 Elsevier Interactive Patient Education  2017 [employer].

## 2017-05-17 NOTE — Assessment & Plan Note (Signed)
New referral made. Called the Heme/onc clinic and spoke with scheduler to explain about patient's financial difficulties. Called patient after clinic to call scheduler. Dicussed the importance of seeing specialist as it does not seem she has been seen by oncology in years.

## 2017-05-17 NOTE — Assessment & Plan Note (Signed)
BP at goal in clinic but has higher SBPs at home readings. Hesitant to increase dose of medication as her diastolic BP is low-normal. Counseled on low salt diet. Monitor at home and call with blood pressures.

## 2017-05-20 ENCOUNTER — Other Ambulatory Visit: Payer: Self-pay | Admitting: Internal Medicine

## 2017-05-20 ENCOUNTER — Other Ambulatory Visit: Payer: Self-pay | Admitting: *Deleted

## 2017-05-20 NOTE — Telephone Encounter (Signed)
Pt is requesting a 90 days supply for mailorder. Giovana Faciane, Salome Spotted, CMA

## 2017-05-27 ENCOUNTER — Telehealth: Payer: Self-pay | Admitting: Internal Medicine

## 2017-05-27 NOTE — Telephone Encounter (Signed)
Pt called to speak to Dr. Dallas Schimke about her request for medical records from [place]. Please call . jw

## 2017-05-27 NOTE — Telephone Encounter (Signed)
Spoke with patient and she states that she will be coming by today to sign a release of information for her previous doctor in [place].  Zailee Vallely,CMA

## 2017-06-06 ENCOUNTER — Encounter: Payer: Self-pay | Admitting: Internal Medicine

## 2017-06-09 ENCOUNTER — Other Ambulatory Visit: Payer: Self-pay | Admitting: *Deleted

## 2017-06-09 MED ORDER — CILOSTAZOL 50 MG PO TABS: 50.0000 mg | ORAL_TABLET | Freq: Two times a day (BID) | ORAL | 0 refills | Status: DC

## 2017-06-14 ENCOUNTER — Other Ambulatory Visit: Payer: Self-pay | Admitting: Internal Medicine

## 2017-06-14 MED ORDER — METFORMIN HCL 500 MG PO TABS: ORAL_TABLET | ORAL | 0 refills | Status: DC

## 2017-06-14 MED ORDER — MONTELUKAST SODIUM 10 MG PO TABS: 10.0000 mg | ORAL_TABLET | Freq: Every day | ORAL | 0 refills | Status: DC

## 2017-06-14 MED ORDER — HYDROCHLOROTHIAZIDE 12.5 MG PO CAPS: 12.5000 mg | ORAL_CAPSULE | Freq: Every day | ORAL | 0 refills | Status: DC

## 2017-06-14 MED ORDER — METOPROLOL TARTRATE 25 MG PO TABS: 25.0000 mg | ORAL_TABLET | Freq: Two times a day (BID) | ORAL | 0 refills | Status: DC

## 2017-06-14 MED ORDER — ONDANSETRON HCL 4 MG PO TABS: 4.0000 mg | ORAL_TABLET | Freq: Four times a day (QID) | ORAL | 0 refills | Status: DC | PRN

## 2017-06-14 MED ORDER — SIMVASTATIN 20 MG PO TABS: 20.0000 mg | ORAL_TABLET | Freq: Every day | ORAL | 0 refills | Status: DC

## 2017-06-14 MED ORDER — FAMOTIDINE 20 MG PO TABS: 20.0000 mg | ORAL_TABLET | Freq: Every day | ORAL | 0 refills | Status: DC

## 2017-06-14 MED ORDER — CILOSTAZOL 50 MG PO TABS: 50.0000 mg | ORAL_TABLET | Freq: Two times a day (BID) | ORAL | 0 refills | Status: DC

## 2017-06-14 MED ORDER — ENALAPRIL MALEATE 20 MG PO TABS: 20.0000 mg | ORAL_TABLET | Freq: Every day | ORAL | 0 refills | Status: DC

## 2017-06-14 MED ORDER — CYCLOBENZAPRINE HCL 10 MG PO TABS: 10.0000 mg | ORAL_TABLET | Freq: Every day | ORAL | 0 refills | Status: DC | PRN

## 2017-06-14 MED ORDER — POLYETHYLENE GLYCOL 3350 17 G PO PACK: 17.0000 g | PACK | Freq: Two times a day (BID) | ORAL | 0 refills | Status: DC

## 2017-06-14 MED ORDER — TRAMADOL HCL 50 MG PO TABS: 50.0000 mg | ORAL_TABLET | Freq: Every day | ORAL | 0 refills | Status: DC | PRN

## 2017-06-14 MED ORDER — LORATADINE 10 MG PO TABS: 10.0000 mg | ORAL_TABLET | Freq: Every day | ORAL | 2 refills | Status: DC

## 2017-06-14 MED ORDER — FLUTICASONE-SALMETEROL 250-50 MCG/DOSE IN AEPB: 1.0000 | INHALATION_SPRAY | Freq: Two times a day (BID) | RESPIRATORY_TRACT | 2 refills | Status: DC

## 2017-06-14 NOTE — Telephone Encounter (Signed)
Pt would like PCP to call her, pt would not say why, just that she needs to talk to her doctor.  Pt needs refills on tramadol and muscle relaxer sent to [facility] on [address].   Pt needs refills on pletal, vasktec, pepcid, advair, microzide, claritin, metformin, lopressor, singulair, nitroglycerin, zofran, miralax, zocor sent to [facility] order. ep

## 2017-06-14 NOTE — Telephone Encounter (Signed)
Called patient to discuss. She will drop off a form next week for [place] to complete so we can get her records from prior PCP

## 2017-08-23 ENCOUNTER — Telehealth: Payer: Self-pay

## 2017-08-23 MED ORDER — CYCLOBENZAPRINE HCL 10 MG PO TABS: 10.0000 mg | ORAL_TABLET | Freq: Every day | ORAL | 0 refills | Status: DC | PRN

## 2017-08-23 NOTE — Telephone Encounter (Signed)
Attempted to contact to inform of rx not being able to be sent to her out of state pharmacy. I am not sure how long she will be in [place], maybe we can mail. I will await pts return call as she did not answer. A VM was left.

## 2017-08-23 NOTE — Telephone Encounter (Signed)
Please inform patient that I unfortunately cannot call in or electronically send Rx for tramadol ( I called the pharmacy to ask about this and they denied). I did send in Rx for flexeril.

## 2017-08-23 NOTE — Telephone Encounter (Signed)
Patient is in [place] on vacation and would like for her Tramadol and muscle relaxants to be filled.  The pharmacy that is near her is [facility] [address] [place] [address]  [phone] .Ozella Almond

## 2017-08-24 NOTE — Telephone Encounter (Signed)
Patient is aware that flexeril has been sent to pharmacy and that we are unable to send or call in tramadol.  Patient states that her knee has been giving her a lot of issues but will see if muscle relaxer will help and if not she will seek care at the local urgent care. Ricarda Atayde,CMA

## 2017-08-24 NOTE — Telephone Encounter (Signed)
Pt returned call to nurse line.  LMOVM asking for a return call.  Ayannah Faddis, Salome Spotted, CMA

## 2017-08-30 ENCOUNTER — Telehealth: Payer: Self-pay | Admitting: *Deleted

## 2017-08-30 NOTE — Telephone Encounter (Signed)
Patient would like nurse to call her back regarding problem with tramadol rx.

## 2017-08-31 NOTE — Telephone Encounter (Signed)
Patient states that she did try and go to an urgent care for her pain but they wouldn't fill her tramadol since they don't have any medical history on her.  Patient will let us know when she is heading back so we will have enough time to fill this medication for her. Merced Brougham,CMA

## 2017-09-19 ENCOUNTER — Other Ambulatory Visit: Payer: Self-pay | Admitting: Internal Medicine

## 2017-09-20 ENCOUNTER — Other Ambulatory Visit: Payer: Self-pay | Admitting: Internal Medicine

## 2017-09-20 NOTE — Telephone Encounter (Signed)
Pt is calling because the prescription we sent to [facility], was not received. Can we send this again. She is waiting on her Flexeril. jw

## 2017-10-27 ENCOUNTER — Other Ambulatory Visit: Payer: Self-pay | Admitting: *Deleted

## 2017-10-27 ENCOUNTER — Telehealth: Payer: Self-pay | Admitting: Internal Medicine

## 2017-10-27 MED ORDER — METOPROLOL TARTRATE 25 MG PO TABS: 25.0000 mg | ORAL_TABLET | Freq: Two times a day (BID) | ORAL | 0 refills | Status: DC

## 2017-10-27 MED ORDER — CYCLOBENZAPRINE HCL 10 MG PO TABS: 10.0000 mg | ORAL_TABLET | Freq: Every day | ORAL | 0 refills | Status: DC | PRN

## 2017-10-27 MED ORDER — CILOSTAZOL 50 MG PO TABS: 50.0000 mg | ORAL_TABLET | Freq: Two times a day (BID) | ORAL | 0 refills | Status: DC

## 2017-10-27 MED ORDER — SIMVASTATIN 20 MG PO TABS: 20.0000 mg | ORAL_TABLET | Freq: Every day | ORAL | 0 refills | Status: DC

## 2017-10-27 MED ORDER — METFORMIN HCL 500 MG PO TABS: ORAL_TABLET | ORAL | 0 refills | Status: DC

## 2017-10-27 MED ORDER — CLOPIDOGREL BISULFATE 75 MG PO TABS: 75.0000 mg | ORAL_TABLET | Freq: Every day | ORAL | 4 refills | Status: DC

## 2017-10-27 MED ORDER — HYDROCHLOROTHIAZIDE 12.5 MG PO CAPS: 12.5000 mg | ORAL_CAPSULE | Freq: Every day | ORAL | 0 refills | Status: DC

## 2017-10-27 MED ORDER — ONDANSETRON HCL 4 MG PO TABS: 4.0000 mg | ORAL_TABLET | Freq: Four times a day (QID) | ORAL | 0 refills | Status: DC | PRN

## 2017-10-27 MED ORDER — TRAMADOL HCL 50 MG PO TABS: 50.0000 mg | ORAL_TABLET | Freq: Every day | ORAL | 0 refills | Status: DC | PRN

## 2017-10-27 MED ORDER — ENALAPRIL MALEATE 20 MG PO TABS: 20.0000 mg | ORAL_TABLET | Freq: Every day | ORAL | 0 refills | Status: DC

## 2017-10-27 MED ORDER — FAMOTIDINE 20 MG PO TABS: 20.0000 mg | ORAL_TABLET | Freq: Every day | ORAL | 0 refills | Status: DC

## 2017-10-27 NOTE — Telephone Encounter (Signed)
Opened in error

## 2017-10-27 NOTE — Telephone Encounter (Signed)
Please inform patient that I have refilled medications that she mentioned and called in the tramadol because I cannot electronically send this in this evening.

## 2017-10-27 NOTE — Telephone Encounter (Signed)
Pt called back. She still said she needed all her medications.  After going thru the list, she decided she needed everything BUT: albuterol, Claritin, Advair, Singulair nitroglycerin and miralax [facility] [address]

## 2017-10-27 NOTE — Telephone Encounter (Signed)
Patient left message on nurse line requesting refill on 'all' medications. Left message on voicemail for patient to call back.

## 2017-11-03 ENCOUNTER — Encounter: Payer: Self-pay | Admitting: Internal Medicine

## 2017-11-03 NOTE — Progress Notes (Signed)
[facility] Phone: [phone]   Date of Visit: 11/04/2017   HPI:  DM2:  -Her hemoglobin A1c has been stable for quite some time. -She does not check her sugars daily.  When she does, she reports they are less than 150  Denies any polyuria or polydipsia -She is compliant with her medication: Metformin 500 mg twice daily  HTN: -Medications: Enalapril 20 mg daily, hydrochlorothiazide 12.5 mg daily, metoprolol 25 mg twice daily -She is compliant with her medications. -She denies any chest pain, shortness of breath.   CLL: -We have discussed about her CLL since she first established care here.  She has said multiple times that she has not seen an oncologist for this yet.  At that point multiple prior visits we have discussed the importance of establishing care with an oncologist for this.  She has missed or canceled quite a few initial scheduled appointments with oncology.  We discussed the importance of seeing an oncologist again today.  She reports that she will go this time and request that I make another referral today.  Tobacco use: -She reports of smoking on average 0.2 packs/day for the past 45 years. -She is not interested in quitting currently  Financial difficulties: -Patient does have some financial difficulties.  She is currently able to afford all of her medications.  She sometimes reports that she worries that she will be able to afford visits to specialists.  She does have insurance.  Chronic pain: -Patient has chronic low back pain for which she takes as needed Flexeril and as needed tramadol.  She reports she only takes 1 tablet of Flexeril a day and 1 tablet of tramadol a day. -She denies any muscle weakness in her lower extremities, she denies any numbness in her lower extremities, she denies any urinary incontinence or retention, she denies any fecal incontinence.   ROS: See HPI.  [place]:   PMH: DM2 HTN PVD COPD GER CKD3 CLL HLD Tobacco Dependence Chronic Back Pain Tension HA  PHYSICAL EXAM: BP 138/76   Pulse (!) 57   Temp 98.6 F (37 C) (Oral)   Ht 5\' 2"  (1.575 m)   Wt 132 lb (59.9 kg)   SpO2 97%   BMI 24.14 kg/m  GEN: NAD, pleasant CV: RRR PULM: CTAB, normal effort SKIN: No rash or cyanosis; warm and well-perfused EXTR: No lower extremity edema or calf tenderness PSYCH: Mood and affect euthymic, normal rate and volume of speech NEURO: Awake, alert, no focal deficits grossly, normal speech  Diabetic Foot Exam - Simple   Simple Foot Form Visual Inspection See comments:  Yes Sensation Testing Pulse Check Comments On the left plantar aspect of the foot there is a callus forming on the skin over the fifth meta tarsal phalangeal joint. Her dorsalis pedis pulses are slight.  She has known peripheral vascular disease with stents and she also is on Pletal for this. Monofilament testing: Overall intact sensation.     ASSESSMENT/PLAN:  Health maintenance:  -We had discussed multiple times regarding her health maintenance tasks that are overdue.  She has been reporting that some of these things have been completed at her last PCPs office however we had been having difficult time obtaining these records.  She has personally try to call the PCPs office to obtain records but has also been having a difficult time.  We discussed options including proceeding with completing these health maintenance tasks since we have been having issues getting records from prior PCP.  She reports that she would like to try one more time to get records.  She understands the importance of getting these health maintenance tasks completed.  DM (diabetes mellitus), type 2 with peripheral vascular complications H3S today is 6.3.  Diabetic foot exam completed today.  Continue metformin 500 mg twice daily.  Next A1c will be completed at 6 months.  HYPERTENSION, BENIGN SYSTEMIC Blood  pressure is at goal.  However her heart rate is slightly bit bradycardic.  This is not a new issue but has been chronically having slight bradycardia.  We decided to decrease her metoprolol to 12.5 mg twice daily.  She will take half a tablet of the 25 mg metoprolol.  She has a friend who is a nurse who will be able to check her blood pressures with this medication change.  She is to call me in about 1-2 weeks to inform me if her blood pressures are greater than 140/90.  Chronic lymphocytic leukemia ([place]) Once again, we will refer her to hematology oncology.  Hopefully this time she will be able to keep an appointment.  TOBACCO DEPENDENCE Does not meet criteria for lung cancer screening.  Not currently ready to quit.  Smiley Houseman, MD PGY [place]

## 2017-11-04 ENCOUNTER — Ambulatory Visit (INDEPENDENT_AMBULATORY_CARE_PROVIDER_SITE_OTHER): Payer: Self-pay | Admitting: Internal Medicine

## 2017-11-04 ENCOUNTER — Encounter: Payer: Self-pay | Admitting: Internal Medicine

## 2017-11-04 ENCOUNTER — Other Ambulatory Visit: Payer: Self-pay

## 2017-11-04 VITALS — BP 138/76 | HR 57 | Temp 98.6°F | Ht 62.0 in | Wt 132.0 lb

## 2017-11-04 DIAGNOSIS — F17200 Nicotine dependence, unspecified, uncomplicated: Secondary | ICD-10-CM

## 2017-11-04 DIAGNOSIS — E1151 Type 2 diabetes mellitus with diabetic peripheral angiopathy without gangrene: Secondary | ICD-10-CM

## 2017-11-04 DIAGNOSIS — E1159 Type 2 diabetes mellitus with other circulatory complications: Secondary | ICD-10-CM

## 2017-11-04 DIAGNOSIS — C9190 Lymphoid leukemia, unspecified not having achieved remission: Secondary | ICD-10-CM

## 2017-11-04 DIAGNOSIS — C9110 Chronic lymphocytic leukemia of B-cell type not having achieved remission: Secondary | ICD-10-CM

## 2017-11-04 DIAGNOSIS — I10 Essential (primary) hypertension: Secondary | ICD-10-CM

## 2017-11-04 LAB — POCT GLYCOSYLATED HEMOGLOBIN (HGB A1C): Hemoglobin A1C: 6.3

## 2017-11-04 NOTE — Patient Instructions (Addendum)
Lets decrease the Metoprolol to 12.5mg  (0.5 tab of the 25 mg tablet) twice a day.  Check your blood pressures at home. Call me if your blood pressure is over 140/90. Call me in about 1-2 weeks to tell me about your blood pressure.  Follow up in 4 weeks so we can talk about your health maintenance topics.

## 2017-11-05 ENCOUNTER — Encounter: Payer: Self-pay | Admitting: Internal Medicine

## 2017-11-05 LAB — BASIC METABOLIC PANEL
BUN/Creatinine Ratio: 10 — ABNORMAL LOW (ref 12–28)
BUN: 14 mg/dL (ref 8–27)
CO2: 31 mmol/L — ABNORMAL HIGH (ref 20–29)
Calcium: 9.5 mg/dL (ref 8.7–10.3)
Chloride: 102 mmol/L (ref 96–106)
Creatinine, Ser: 1.35 mg/dL — ABNORMAL HIGH (ref 0.57–1.00)
GFR calc Af Amer: 46 mL/min/{1.73_m2} — ABNORMAL LOW (ref >59)
GFR calc non Af Amer: 40 mL/min/{1.73_m2} — ABNORMAL LOW (ref >59)
Glucose: 89 mg/dL (ref 65–99)
Potassium: 4.3 mmol/L (ref 3.5–5.2)
Sodium: 143 mmol/L (ref 134–144)

## 2017-11-05 NOTE — Assessment & Plan Note (Signed)
Once again, we will refer her to hematology oncology.  Hopefully this time she will be able to keep an appointment.

## 2017-11-05 NOTE — Assessment & Plan Note (Signed)
Does not meet criteria for lung cancer screening.  Not currently ready to quit.

## 2017-11-05 NOTE — Assessment & Plan Note (Signed)
A1c today is 6.3.  Diabetic foot exam completed today.  Continue metformin 500 mg twice daily.  Next A1c will be completed at 6 months.

## 2017-11-05 NOTE — Assessment & Plan Note (Signed)
Blood pressure is at goal.  However her heart rate is slightly bit bradycardic.  This is not a new issue but has been chronically having slight bradycardia.  We decided to decrease her metoprolol to 12.5 mg twice daily.  She will take half a tablet of the 25 mg metoprolol.  She has a friend who is a nurse who will be able to check her blood pressures with this medication change.  She is to call me in about 1-2 weeks to inform me if her blood pressures are greater than 140/90.

## 2017-11-27 ENCOUNTER — Emergency Department (HOSPITAL_COMMUNITY)
Admission: EM | Admit: 2017-11-27 | Discharge: 2017-11-27 | Disposition: A | Discharge status: Home / Self Care | Payer: Medicare Other | Attending: Emergency Medicine | Admitting: Emergency Medicine

## 2017-11-27 ENCOUNTER — Encounter (HOSPITAL_COMMUNITY): Payer: Self-pay | Admitting: Emergency Medicine

## 2017-11-27 ENCOUNTER — Emergency Department (HOSPITAL_COMMUNITY): Payer: Medicare Other

## 2017-11-27 DIAGNOSIS — R9431 Abnormal electrocardiogram [ECG] [EKG]: Secondary | ICD-10-CM | POA: Diagnosis not present

## 2017-11-27 DIAGNOSIS — E1151 Type 2 diabetes mellitus with diabetic peripheral angiopathy without gangrene: Secondary | ICD-10-CM | POA: Insufficient documentation

## 2017-11-27 DIAGNOSIS — D72829 Elevated white blood cell count, unspecified: Secondary | ICD-10-CM | POA: Diagnosis not present

## 2017-11-27 DIAGNOSIS — I129 Hypertensive chronic kidney disease with stage 1 through stage 4 chronic kidney disease, or unspecified chronic kidney disease: Secondary | ICD-10-CM | POA: Diagnosis not present

## 2017-11-27 DIAGNOSIS — Z79899 Other long term (current) drug therapy: Secondary | ICD-10-CM | POA: Diagnosis not present

## 2017-11-27 DIAGNOSIS — N183 Chronic kidney disease, stage 3 (moderate): Secondary | ICD-10-CM | POA: Diagnosis not present

## 2017-11-27 DIAGNOSIS — J441 Chronic obstructive pulmonary disease with (acute) exacerbation: Secondary | ICD-10-CM | POA: Diagnosis not present

## 2017-11-27 DIAGNOSIS — R0602 Shortness of breath: Secondary | ICD-10-CM | POA: Insufficient documentation

## 2017-11-27 DIAGNOSIS — D72820 Lymphocytosis (symptomatic): Secondary | ICD-10-CM | POA: Diagnosis not present

## 2017-11-27 DIAGNOSIS — Z7984 Long term (current) use of oral hypoglycemic drugs: Secondary | ICD-10-CM | POA: Insufficient documentation

## 2017-11-27 DIAGNOSIS — E1122 Type 2 diabetes mellitus with diabetic chronic kidney disease: Secondary | ICD-10-CM | POA: Insufficient documentation

## 2017-11-27 DIAGNOSIS — R05 Cough: Secondary | ICD-10-CM | POA: Diagnosis not present

## 2017-11-27 LAB — CBC WITH DIFFERENTIAL/PLATELET
Basophils Absolute: 0 10*3/uL (ref 0.0–0.1)
Basophils Relative: 0 %
Eosinophils Absolute: 0 10*3/uL (ref 0.0–0.7)
Eosinophils Relative: 0 %
HCT: 40.8 % (ref 36.0–46.0)
Hemoglobin: 12.9 g/dL (ref 12.0–15.0)
Lymphocytes Relative: 88 %
Lymphs Abs: 51.7 10*3/uL — ABNORMAL HIGH (ref 0.7–4.0)
MCH: 28.5 pg (ref 26.0–34.0)
MCHC: 31.6 g/dL (ref 30.0–36.0)
MCV: 90.3 fL (ref 78.0–100.0)
Monocytes Absolute: 0 10*3/uL — ABNORMAL LOW (ref 0.1–1.0)
Monocytes Relative: 0 %
Neutro Abs: 7.1 10*3/uL (ref 1.7–7.7)
Neutrophils Relative %: 12 %
Platelets: 252 10*3/uL (ref 150–400)
RBC: 4.52 MIL/uL (ref 3.87–5.11)
RDW: 16.7 % — ABNORMAL HIGH (ref 11.5–15.5)
WBC: 58.8 10*3/uL (ref 4.0–10.5)

## 2017-11-27 LAB — COMPREHENSIVE METABOLIC PANEL
ALT: 10 U/L — ABNORMAL LOW (ref 14–54)
AST: 19 U/L (ref 15–41)
Albumin: 4.4 g/dL (ref 3.5–5.0)
Alkaline Phosphatase: 101 U/L (ref 38–126)
Anion gap: 9 (ref 5–15)
BUN: 14 mg/dL (ref 6–20)
CO2: 30 mmol/L (ref 22–32)
Calcium: 9.4 mg/dL (ref 8.9–10.3)
Chloride: 102 mmol/L (ref 101–111)
Creatinine, Ser: 1.21 mg/dL — ABNORMAL HIGH (ref 0.44–1.00)
GFR calc Af Amer: 51 mL/min — ABNORMAL LOW (ref >60)
GFR calc non Af Amer: 44 mL/min — ABNORMAL LOW (ref >60)
Glucose, Bld: 94 mg/dL (ref 65–99)
Potassium: 3.7 mmol/L (ref 3.5–5.1)
Sodium: 141 mmol/L (ref 135–145)
Total Bilirubin: 0.6 mg/dL (ref 0.3–1.2)
Total Protein: 7.5 g/dL (ref 6.5–8.1)

## 2017-11-27 LAB — TROPONIN I: Troponin I: <0.03 ng/mL (ref <0.03)

## 2017-11-27 LAB — BRAIN NATRIURETIC PEPTIDE: B Natriuretic Peptide: 16.2 pg/mL (ref 0.0–100.0)

## 2017-11-27 MED ORDER — ALBUTEROL SULFATE (2.5 MG/3ML) 0.083% IN NEBU
5.0000 mg | INHALATION_SOLUTION | Freq: Once | RESPIRATORY_TRACT | Status: AC
  Administered 2017-11-27: 5 mg via RESPIRATORY_TRACT
  Filled 2017-11-27: qty 6

## 2017-11-27 MED ORDER — METHYLPREDNISOLONE SODIUM SUCC 125 MG IJ SOLR
125.0000 mg | Freq: Once | INTRAMUSCULAR | Status: AC
  Administered 2017-11-27: 125 mg via INTRAVENOUS
  Filled 2017-11-27: qty 2

## 2017-11-27 MED ORDER — PREDNISONE 20 MG PO TABS: 40.0000 mg | ORAL_TABLET | Freq: Every day | ORAL | 0 refills | Status: DC

## 2017-11-27 MED ORDER — ALBUTEROL SULFATE HFA 108 (90 BASE) MCG/ACT IN AERS
2.0000 | INHALATION_SPRAY | Freq: Four times a day (QID) | RESPIRATORY_TRACT | Status: DC
  Administered 2017-11-27: 2 via RESPIRATORY_TRACT
  Filled 2017-11-27: qty 6.7

## 2017-11-27 MED ORDER — FLUTICASONE-SALMETEROL 250-50 MCG/DOSE IN AEPB: 1.0000 | INHALATION_SPRAY | Freq: Two times a day (BID) | RESPIRATORY_TRACT | 0 refills | Status: DC

## 2017-11-27 NOTE — Discharge Instructions (Signed)
As discussed, it is important that you follow up with your physician and an oncologist for continued management of your condition.  If you develop any new, or concerning changes in your condition, please return to the emergency department immediately.

## 2017-11-27 NOTE — ED Provider Notes (Signed)
[facility] DEPT Provider Note   CSN: [id number] Arrival date & time: 11/27/17  3664     History   Chief Complaint Chief Complaint  Patient presents with  . Cough  . Abdominal Pain    HPI [name] [name] is a 71 y.o. female.  HPI Patient presents with concern of cough, bilateral rib pain with coughing. She acknowledges multiple medical problems, states that she ran out of her COPD medication. Now over the past 2 days she has had worsening cough, difficulty with breathing and bilateral rib pain. When she is not coughing, she has some soreness, but symptoms are certainly worse with coughing episodes. No report of new fever, vomiting, syncope, abdominal pain, vomiting, diarrhea.  Patient is a smoker.  Past Medical History:  Diagnosis Date  . Asthma   . CLL (chronic lymphoblastic leukemia)   . Constipation 11/15/2011  . COPD (chronic obstructive pulmonary disease) ([place])   . Diabetes mellitus without complication ([place])   . GERD (gastroesophageal reflux disease)   . Hyperlipidemia   . Hypertension   . PVD (peripheral vascular disease) ([place])   . Stroke [facility])    in 1998 due to tumor     Patient Active Problem List   Diagnosis Date Noted  . Enteritis   . Bradycardia 07/01/2016  . Chronic kidney disease (CKD), stage III (moderate) ([place]) 06/04/2016  . Shoulder pain, left 04/06/2016  . Chest pain 02/06/2016  . Chronic back pain 10/02/2015  . Acute right hip pain 03/13/2012  . Episodic tension type headache 09/24/2010  . GAIT IMBALANCE 12/10/2009  . DM (diabetes mellitus), type 2 with peripheral vascular complications ([place]) 40/34/7425  . Chronic lymphocytic leukemia ([place]) 04/11/2009  . Adjustment disorder with depressed mood 12/05/2008  . PERIPHERAL VASCULAR DISEASE 10/13/2007  . HYPERCHOLESTEROLEMIA 02/23/2007  . ANXIETY 02/23/2007  . TOBACCO DEPENDENCE 02/23/2007  . HYPERTENSION, BENIGN SYSTEMIC 02/23/2007  . COPD 02/23/2007  .  GASTROESOPHAGEAL REFLUX, NO ESOPHAGITIS 02/23/2007    Past Surgical History:  Procedure Laterality Date  . ABI  02/2012   ABI <0.65 BL 02/2012  . BRAIN MENINGIOMA EXCISION    . Bypass grafting of RLE for PAD claudication     . CESAREAN SECTION     1974, 77 ,79  . CHOLECYSTECTOMY, LAPAROSCOPIC    . ESOPHAGEAL DILATION      OB History    No data available       Home Medications    Prior to Admission medications   Medication Sig Start Date End Date Taking? Authorizing Provider  albuterol (PROVENTIL HFA;VENTOLIN HFA) 108 (90 Base) MCG/ACT inhaler Inhale 2 puffs into the lungs every 4 (four) hours as needed for wheezing or shortness of breath. 10/26/16  Yes Smiley Houseman, MD  cyclobenzaprine (FLEXERIL) 10 MG tablet Take 1 tablet (10 mg total) by mouth daily as needed for muscle spasms. 10/27/17  Yes Smiley Houseman, MD  enalapril (VASOTEC) 20 MG tablet Take 1 tablet (20 mg total) by mouth daily. 10/27/17  Yes Smiley Houseman, MD  famotidine (PEPCID) 20 MG tablet Take 1 tablet (20 mg total) by mouth daily. 10/27/17  Yes Smiley Houseman, MD  Fluticasone-Salmeterol (ADVAIR DISKUS) 250-50 MCG/DOSE AEPB Inhale 1 puff into the lungs 2 (two) times daily. 06/14/17  Yes Smiley Houseman, MD  hydrochlorothiazide (MICROZIDE) 12.5 MG capsule Take 1 capsule (12.5 mg total) by mouth daily. 10/27/17  Yes Smiley Houseman, MD  metFORMIN (GLUCOPHAGE) 500 MG tablet TAKE 1 TABLET TWICE A  DAY  WITH A MEAL 10/27/17  Yes Smiley Houseman, MD  metoprolol tartrate (LOPRESSOR) 25 MG tablet Take 1 tablet (25 mg total) by mouth 2 (two) times daily. Patient taking differently: Take 12.5 mg by mouth 2 (two) times daily.  10/27/17  Yes Smiley Houseman, MD  montelukast (SINGULAIR) 10 MG tablet Take 1 tablet (10 mg total) by mouth at bedtime. 06/14/17  Yes Smiley Houseman, MD  ondansetron (ZOFRAN) 4 MG tablet Take 1 tablet (4 mg total) by mouth every 6 (six) hours as needed for  nausea. 10/27/17  Yes Smiley Houseman, MD  polyethylene glycol (MIRALAX / GLYCOLAX) packet Take 17 g by mouth 2 (two) times daily. 06/14/17  Yes Smiley Houseman, MD  simvastatin (ZOCOR) 20 MG tablet Take 1 tablet (20 mg total) by mouth at bedtime. 10/27/17  Yes Smiley Houseman, MD  traMADol (ULTRAM) 50 MG tablet Take 1 tablet (50 mg total) by mouth daily as needed (For pain.). Reported on 01/01/2016 10/27/17  Yes Smiley Houseman, MD  cilostazol (PLETAL) 50 MG tablet Take 1 tablet (50 mg total) by mouth 2 (two) times daily. 10/27/17   Smiley Houseman, MD  clopidogrel (PLAVIX) 75 MG tablet Take 1 tablet (75 mg total) by mouth daily. 10/27/17   Smiley Houseman, MD  loratadine (CLARITIN) 10 MG tablet Take 1 tablet (10 mg total) by mouth daily. 06/14/17   Smiley Houseman, MD  nitroGLYCERIN (NITROSTAT) 0.4 MG SL tablet Place 1 tablet (0.4 mg total) under the tongue every 5 (five) minutes as needed for chest pain. Only take up to 3 tablets. 12/22/16   Smiley Houseman, MD    Family History Family History  Problem Relation Age of Onset  . Heart disease Mother   . Asthma Mother   . Cancer Mother        uterine   . Depression Mother   . Heart attack Mother 48  . Hyperlipidemia Mother   . Hypertension Mother   . Stroke Mother   . Kidney disease Mother   . Heart attack Sister   . Stroke Sister   . Depression Sister   . Diabetes Sister   . Hyperlipidemia Sister   . Depression Sister   . Diabetes Sister   . Hyperlipidemia Sister   . HIV/AIDS Sister   . Diabetes Brother   . Asthma Brother   . Hypertension Brother   . Cancer Maternal Aunt        lung  . Heart disease Maternal Grandmother   . Heart attack Maternal Grandmother   . Cancer Brother        colon  . HIV/AIDS Brother     Social History Social History   Tobacco Use  . Smoking status: Current Some Day Smoker    Packs/day: 0.30    Years: 15.00    Pack years: 4.50    Types: Cigarettes    Start  date: 01/01/1976  . Smokeless tobacco: Never Used  . Tobacco comment: pt states that she does not smoke everyday   Substance Use Topics  . Alcohol use: No    Alcohol/week: 0.0 oz  . Drug use: No     Allergies   Aspirin and Baclofen   Review of Systems Review of Systems  Constitutional:       Per HPI, otherwise negative  HENT:       Per HPI, otherwise negative  Respiratory:       Per HPI, otherwise negative  Cardiovascular:       Per HPI, otherwise negative  Gastrointestinal: Negative for vomiting.  Endocrine:       Negative aside from HPI  Genitourinary:       Neg aside from HPI   Musculoskeletal:       Per HPI, otherwise negative  Skin: Negative.   Allergic/Immunologic:       Noted history of CLL, but the patient seemingly is unaware of this.  Neurological: Positive for weakness. Negative for syncope.     Physical Exam Updated Vital Signs BP (!) 175/83 (BP Location: Left Arm)   Pulse (!) 59   Temp 98.2 F (36.8 C) (Oral)   Resp 18   SpO2 100%   Physical Exam  Constitutional: She is oriented to person, place, and time. She appears well-developed and well-nourished. No distress.  HENT:  Head: Normocephalic and atraumatic.  Poor dentition, very few teeth  Eyes: Conjunctivae and EOM are normal.  Cardiovascular: Normal rate and regular rhythm.  Pulmonary/Chest: She has decreased breath sounds.  Abdominal: She exhibits no distension.  Musculoskeletal: She exhibits no edema.  Neurological: She is alert and oriented to person, place, and time. She displays atrophy. No cranial nerve deficit. Coordination normal.  Skin: Skin is warm and dry.  Psychiatric: She has a normal mood and affect.  Nursing note and vitals reviewed.    ED Treatments / Results  Labs (all labs ordered are listed, but only abnormal results are displayed) Labs Reviewed  COMPREHENSIVE METABOLIC PANEL - Abnormal; Notable for the following components:      Result Value   Creatinine, Ser 1.21  (*)    ALT 10 (*)    GFR calc non Af Amer 44 (*)    GFR calc Af Amer 51 (*)    All other components within normal limits  CBC WITH DIFFERENTIAL/PLATELET - Abnormal; Notable for the following components:   WBC 58.8 (*)    RDW 16.7 (*)    Lymphs Abs 51.7 (*)    Monocytes Absolute 0.0 (*)    All other components within normal limits  TROPONIN I  BRAIN NATRIURETIC PEPTIDE  PATHOLOGIST SMEAR REVIEW    EKG  EKG Interpretation  Date/Time:  Sunday November 27 2017 12:30:34 EST Ventricular Rate:  62 PR Interval:    QRS Duration: 94 QT Interval:  477 QTC Calculation: 485 R Axis:   81 Text Interpretation:  Sinus rhythm Consider left atrial enlargement Borderline right axis deviation Probable LVH with secondary repol abnrm Abnormal ekg Confirmed by Carmin Muskrat [phone]) on 11/27/2017 12:41:17 PM       Radiology Dg Chest 2 View  Result Date: 11/27/2017 CLINICAL DATA:  Shortness of breath and coughing. EXAM: CHEST  2 VIEW COMPARISON:  Chest x-ray dated April 09, 2017. FINDINGS: The cardiomediastinal silhouette is borderline enlarged. Normal pulmonary vascularity. Atherosclerotic calcification of the aortic arch. No focal consolidation, pleural effusion, or pneumothorax. No acute osseous abnormality. IMPRESSION: No active cardiopulmonary disease. Electronically Signed   By: Titus Dubin M.D.   On: 11/27/2017 13:01    Procedures Procedures (including critical care time)  Medications Ordered in ED Medications  methylPREDNISolone sodium succinate (SOLU-MEDROL) 125 mg/2 mL injection 125 mg (125 mg Intravenous Given 11/27/17 1328)  albuterol (PROVENTIL) (2.5 MG/3ML) 0.083% nebulizer solution 5 mg (5 mg Nebulization Given 11/27/17 1356)     Initial Impression / Assessment and Plan / ED Course  I have reviewed the triage vital signs and the nursing notes.  Pertinent labs & imaging results  that were available during my care of the patient were reviewed by me and considered in my medical  decision making (see chart for details).  Chart review notable for history of CLL, COPD, ongoing smoking.  On repeat exam the patient is awake and alert, in no distress. No evidence for pneumonia on x-ray, she remains afebrile. She does have notable leukocytosis, consistent with prior diagnosis of CLL. No evidence for acute crisis.  With her husband we discussed the importance of following up with oncology, which she has been referred to do multiple times, but states that she has not yet done so.  Specifically we discussed the importance of seeing an oncologist/hematologist this week given her increasing leukocytosis over the past few months. Absent evidence for distress, and with other clear etiology for her dyspnea, cough (running out of her COPD medication) no indication for admission.  The patient indicates that she can follow-up as an outpatient will do so in the coming days. She also indicates that she has a primary care visit scheduled in 48 hours. She was encouraged to keep this appointment in addition to make an oncology appointment.   Final Clinical Impressions(s) / ED Diagnoses  COPD exacerbation Leukocytosis   Carmin Muskrat, MD 11/27/17 1407

## 2017-11-27 NOTE — ED Notes (Signed)
Date and time results received: 11/27/17 1334 (use smartphrase ".now" to insert current time)  Test: WBC Critical Value: 58.75  Name of Provider Notified: MD Vanita Panda  Orders Received? Or Actions Taken?: Notified ED provider verbally.

## 2017-11-27 NOTE — ED Triage Notes (Signed)
Pt reports she is having L side abd pain only with coughing. Pt has had cough since yesterday. No n/v/d. Pt also reports she had a nose bleed this am that lasted 10 minutes. No bleeding at present. Pt also reports she got a cut on her leg yesterday when getting into her car. Hx of DM.

## 2017-11-27 NOTE — ED Notes (Signed)
Pt stated she went on a walk outside this past Friday around 1600. Pt came back from walk and began coughing. Pt coughing with non productive cough and pt bilateral obliques are sore to touch.

## 2017-11-27 NOTE — ED Notes (Signed)
ED Provider at bedside. 

## 2017-11-28 ENCOUNTER — Encounter (HOSPITAL_COMMUNITY): Payer: Self-pay | Admitting: Emergency Medicine

## 2017-11-28 ENCOUNTER — Emergency Department (HOSPITAL_COMMUNITY)
Admission: EM | Admit: 2017-11-28 | Discharge: 2017-11-28 | Disposition: A | Discharge status: Home / Self Care | Payer: Medicare Other | Attending: Emergency Medicine | Admitting: Emergency Medicine

## 2017-11-28 DIAGNOSIS — E119 Type 2 diabetes mellitus without complications: Secondary | ICD-10-CM | POA: Diagnosis not present

## 2017-11-28 DIAGNOSIS — J449 Chronic obstructive pulmonary disease, unspecified: Secondary | ICD-10-CM | POA: Diagnosis not present

## 2017-11-28 DIAGNOSIS — Z7984 Long term (current) use of oral hypoglycemic drugs: Secondary | ICD-10-CM | POA: Insufficient documentation

## 2017-11-28 DIAGNOSIS — F17210 Nicotine dependence, cigarettes, uncomplicated: Secondary | ICD-10-CM | POA: Insufficient documentation

## 2017-11-28 DIAGNOSIS — D72820 Lymphocytosis (symptomatic): Secondary | ICD-10-CM | POA: Diagnosis not present

## 2017-11-28 DIAGNOSIS — Z8673 Personal history of transient ischemic attack (TIA), and cerebral infarction without residual deficits: Secondary | ICD-10-CM | POA: Insufficient documentation

## 2017-11-28 DIAGNOSIS — I10 Essential (primary) hypertension: Secondary | ICD-10-CM

## 2017-11-28 DIAGNOSIS — R040 Epistaxis: Secondary | ICD-10-CM | POA: Insufficient documentation

## 2017-11-28 DIAGNOSIS — E785 Hyperlipidemia, unspecified: Secondary | ICD-10-CM | POA: Diagnosis not present

## 2017-11-28 DIAGNOSIS — Z7901 Long term (current) use of anticoagulants: Secondary | ICD-10-CM | POA: Insufficient documentation

## 2017-11-28 DIAGNOSIS — Z79899 Other long term (current) drug therapy: Secondary | ICD-10-CM | POA: Diagnosis not present

## 2017-11-28 DIAGNOSIS — D72829 Elevated white blood cell count, unspecified: Secondary | ICD-10-CM

## 2017-11-28 LAB — PREPARE FRESH FROZEN PLASMA
Unit division: 0
Unit division: 0

## 2017-11-28 LAB — CBC
HCT: 34.5 % — ABNORMAL LOW (ref 36.0–46.0)
Hemoglobin: 11 g/dL — ABNORMAL LOW (ref 12.0–15.0)
MCH: 28.1 pg (ref 26.0–34.0)
MCHC: 31.9 g/dL (ref 30.0–36.0)
MCV: 88 fL (ref 78.0–100.0)
Platelets: 201 10*3/uL (ref 150–400)
RBC: 3.92 MIL/uL (ref 3.87–5.11)
RDW: 16.9 % — ABNORMAL HIGH (ref 11.5–15.5)
WBC: 62.5 10*3/uL (ref 4.0–10.5)

## 2017-11-28 LAB — COMPREHENSIVE METABOLIC PANEL
ALT: 11 U/L — ABNORMAL LOW (ref 14–54)
AST: 37 U/L (ref 15–41)
Albumin: 4.1 g/dL (ref 3.5–5.0)
Alkaline Phosphatase: 98 U/L (ref 38–126)
Anion gap: 14 (ref 5–15)
BUN: 17 mg/dL (ref 6–20)
CO2: 21 mmol/L — ABNORMAL LOW (ref 22–32)
Calcium: 9.1 mg/dL (ref 8.9–10.3)
Chloride: 102 mmol/L (ref 101–111)
Creatinine, Ser: 1.58 mg/dL — ABNORMAL HIGH (ref 0.44–1.00)
GFR calc Af Amer: 37 mL/min — ABNORMAL LOW (ref >60)
GFR calc non Af Amer: 32 mL/min — ABNORMAL LOW (ref >60)
Glucose, Bld: 241 mg/dL — ABNORMAL HIGH (ref 65–99)
Potassium: 4.3 mmol/L (ref 3.5–5.1)
Sodium: 137 mmol/L (ref 135–145)
Total Bilirubin: 0.3 mg/dL (ref 0.3–1.2)
Total Protein: 6.8 g/dL (ref 6.5–8.1)

## 2017-11-28 LAB — PATHOLOGIST SMEAR REVIEW

## 2017-11-28 LAB — BPAM FFP
Blood Product Expiration Date: 201812112359
Blood Product Expiration Date: 201812222359
ISSUE DATE / TIME: 201812030107
ISSUE DATE / TIME: 201812030107
Unit Type and Rh: 6200
Unit Type and Rh: 6200

## 2017-11-28 LAB — CBC WITH DIFFERENTIAL/PLATELET
Band Neutrophils: 0 %
Basophils Absolute: 0 10*3/uL (ref 0.0–0.1)
Basophils Relative: 0 %
Blasts: 0 %
Eosinophils Absolute: 0 10*3/uL (ref 0.0–0.7)
Eosinophils Relative: 0 %
HCT: 38.4 % (ref 36.0–46.0)
Hemoglobin: 12.4 g/dL (ref 12.0–15.0)
Lymphocytes Relative: 81 %
Lymphs Abs: 58.1 10*3/uL — ABNORMAL HIGH (ref 0.7–4.0)
MCH: 29.2 pg (ref 26.0–34.0)
MCHC: 32.3 g/dL (ref 30.0–36.0)
MCV: 90.4 fL (ref 78.0–100.0)
Metamyelocytes Relative: 0 %
Monocytes Absolute: 2.2 10*3/uL — ABNORMAL HIGH (ref 0.1–1.0)
Monocytes Relative: 3 %
Myelocytes: 0 %
Neutro Abs: 11.5 10*3/uL — ABNORMAL HIGH (ref 1.7–7.7)
Neutrophils Relative %: 16 %
Other: 0 %
Platelets: 282 10*3/uL (ref 150–400)
Promyelocytes Absolute: 0 %
RBC: 4.25 MIL/uL (ref 3.87–5.11)
RDW: 17.2 % — ABNORMAL HIGH (ref 11.5–15.5)
WBC: 71.8 10*3/uL (ref 4.0–10.5)
nRBC: 0 /100 WBC

## 2017-11-28 LAB — I-STAT CHEM 8, ED
BUN: 19 mg/dL (ref 6–20)
Calcium, Ion: 1.02 mmol/L — ABNORMAL LOW (ref 1.15–1.40)
Chloride: 102 mmol/L (ref 101–111)
Creatinine, Ser: 1.3 mg/dL — ABNORMAL HIGH (ref 0.44–1.00)
Glucose, Bld: 241 mg/dL — ABNORMAL HIGH (ref 65–99)
HCT: 40 % (ref 36.0–46.0)
Hemoglobin: 13.6 g/dL (ref 12.0–15.0)
Potassium: 3.8 mmol/L (ref 3.5–5.1)
Sodium: 140 mmol/L (ref 135–145)
TCO2: 22 mmol/L (ref 22–32)

## 2017-11-28 LAB — PROTIME-INR
INR: 0.98
Prothrombin Time: 12.9 seconds (ref 11.4–15.2)

## 2017-11-28 LAB — ABO/RH: ABO/RH(D): A POS

## 2017-11-28 LAB — PREPARE RBC (CROSSMATCH)

## 2017-11-28 MED ORDER — FENTANYL CITRATE (PF) 100 MCG/2ML IJ SOLN
50.0000 ug | Freq: Once | INTRAMUSCULAR | Status: AC
  Administered 2017-11-28: 50 ug via INTRAVENOUS
  Filled 2017-11-28: qty 2

## 2017-11-28 MED ORDER — OXYCODONE-ACETAMINOPHEN 5-325 MG PO TABS
ORAL_TABLET | ORAL | Status: AC
  Filled 2017-11-28: qty 2

## 2017-11-28 MED ORDER — OXYMETAZOLINE HCL 0.05 % NA SOLN
2.0000 | Freq: Once | NASAL | Status: AC
  Administered 2017-11-28: 2 via NASAL
  Filled 2017-11-28: qty 15

## 2017-11-28 MED ORDER — OXYCODONE-ACETAMINOPHEN 5-325 MG PO TABS
2.0000 | ORAL_TABLET | Freq: Once | ORAL | Status: AC
  Administered 2017-11-28: 2 via ORAL

## 2017-11-28 MED ORDER — SODIUM CHLORIDE 0.9 % IV SOLN
Freq: Once | INTRAVENOUS | Status: AC
  Administered 2017-11-28: 02:00:00 via INTRAVENOUS

## 2017-11-28 MED ORDER — AMOXICILLIN 500 MG PO CAPS
1000.0000 mg | ORAL_CAPSULE | Freq: Once | ORAL | Status: AC
  Administered 2017-11-28: 1000 mg via ORAL
  Filled 2017-11-28: qty 2

## 2017-11-28 MED ORDER — SODIUM CHLORIDE 0.9 % IV SOLN: Freq: Once | INTRAVENOUS | Status: DC

## 2017-11-28 MED ORDER — AMOXICILLIN 500 MG PO CAPS
500.0000 mg | ORAL_CAPSULE | Freq: Three times a day (TID) | ORAL | 0 refills | Status: DC
Start: 2017-11-28 — End: 2018-01-04

## 2017-11-28 MED ORDER — HYDRALAZINE HCL 20 MG/ML IJ SOLN
10.0000 mg | INTRAMUSCULAR | Status: AC
  Administered 2017-11-28: 10 mg via INTRAVENOUS
  Filled 2017-11-28: qty 1

## 2017-11-28 NOTE — ED Triage Notes (Addendum)
Patient arrived with EMS from home reports heavy epistaxis this evening with hypertension/headache , presents with profuse bleeding at left nare and small amount at left eye . Pt. is currently taking anticoagulant . Patient was seen at [facility] ER yesterday for the same problem .

## 2017-11-28 NOTE — ED Notes (Signed)
1st unit emergency blood PRBC infusing .

## 2017-11-28 NOTE — ED Provider Notes (Signed)
[facility] EMERGENCY DEPARTMENT Provider Note   CSN: [id number] Arrival date & time: 11/28/17  0104     History   Chief Complaint Chief Complaint  Patient presents with  . Epistaxis  . Hypertension    HPI [name] [name] is a 71 y.o. female with a hx of CLL, COPD, GERD, HTN, PVD,  presents to the Emergency Department complaining of sudden, acute, persistent, progressively worsening epistaxis onset approximately 50 minutes prior to arrival.  Per EMS patient with large volume, brisk bleeding at home with leading out of the bilateral eyes in addition.  They attempted pressure without success.  EMS reports pt hypertensive with SBPs in the 190s on scene.  Pt does take Plavix.  Pt reports taking lopressor and HCTZ at home as directed.     HPI  Past Medical History:  Diagnosis Date  . Asthma   . CLL (chronic lymphoblastic leukemia)   . Constipation 11/15/2011  . COPD (chronic obstructive pulmonary disease) ([place])   . Diabetes mellitus without complication ([place])   . GERD (gastroesophageal reflux disease)   . Hyperlipidemia   . Hypertension   . PVD (peripheral vascular disease) ([place])   . Stroke [facility])    in 1998 due to tumor     Patient Active Problem List   Diagnosis Date Noted  . Enteritis   . Bradycardia 07/01/2016  . Chronic kidney disease (CKD), stage III (moderate) ([place]) 06/04/2016  . Shoulder pain, left 04/06/2016  . Chest pain 02/06/2016  . Chronic back pain 10/02/2015  . Acute right hip pain 03/13/2012  . Episodic tension type headache 09/24/2010  . GAIT IMBALANCE 12/10/2009  . DM (diabetes mellitus), type 2 with peripheral vascular complications ([place]) 62/95/2841  . Chronic lymphocytic leukemia ([place]) 04/11/2009  . Adjustment disorder with depressed mood 12/05/2008  . PERIPHERAL VASCULAR DISEASE 10/13/2007  . HYPERCHOLESTEROLEMIA 02/23/2007  . ANXIETY 02/23/2007  . TOBACCO DEPENDENCE 02/23/2007  . HYPERTENSION, BENIGN SYSTEMIC 02/23/2007  .  COPD 02/23/2007  . GASTROESOPHAGEAL REFLUX, NO ESOPHAGITIS 02/23/2007    Past Surgical History:  Procedure Laterality Date  . ABI  02/2012   ABI <0.65 BL 02/2012  . BRAIN MENINGIOMA EXCISION    . Bypass grafting of RLE for PAD claudication     . CESAREAN SECTION     1974, 77 ,79  . CHOLECYSTECTOMY, LAPAROSCOPIC    . ESOPHAGEAL DILATION      OB History    No data available       Home Medications    Prior to Admission medications   Medication Sig Start Date End Date Taking? Authorizing Provider  albuterol (PROVENTIL HFA;VENTOLIN HFA) 108 (90 Base) MCG/ACT inhaler Inhale 2 puffs into the lungs every 4 (four) hours as needed for wheezing or shortness of breath. 10/26/16  Yes Smiley Houseman, MD  cilostazol (PLETAL) 50 MG tablet Take 1 tablet (50 mg total) by mouth 2 (two) times daily. 10/27/17  Yes Smiley Houseman, MD  clopidogrel (PLAVIX) 75 MG tablet Take 1 tablet (75 mg total) by mouth daily. 10/27/17  Yes Smiley Houseman, MD  cyclobenzaprine (FLEXERIL) 10 MG tablet Take 1 tablet (10 mg total) by mouth daily as needed for muscle spasms. 10/27/17  Yes Smiley Houseman, MD  enalapril (VASOTEC) 20 MG tablet Take 1 tablet (20 mg total) by mouth daily. 10/27/17  Yes Smiley Houseman, MD  famotidine (PEPCID) 20 MG tablet Take 1 tablet (20 mg total) by mouth daily. 10/27/17  Yes Smiley Houseman,  MD  hydrochlorothiazide (MICROZIDE) 12.5 MG capsule Take 1 capsule (12.5 mg total) by mouth daily. 10/27/17  Yes Smiley Houseman, MD  loratadine (CLARITIN) 10 MG tablet Take 1 tablet (10 mg total) by mouth daily. 06/14/17  Yes Smiley Houseman, MD  metFORMIN (GLUCOPHAGE) 500 MG tablet TAKE 1 TABLET TWICE A DAY  WITH A MEAL 10/27/17  Yes Smiley Houseman, MD  metoprolol tartrate (LOPRESSOR) 25 MG tablet Take 1 tablet (25 mg total) by mouth 2 (two) times daily. Patient taking differently: Take 12.5 mg by mouth 2 (two) times daily.  10/27/17  Yes Smiley Houseman, MD   montelukast (SINGULAIR) 10 MG tablet Take 1 tablet (10 mg total) by mouth at bedtime. 06/14/17  Yes Smiley Houseman, MD  nitroGLYCERIN (NITROSTAT) 0.4 MG SL tablet Place 1 tablet (0.4 mg total) under the tongue every 5 (five) minutes as needed for chest pain. Only take up to 3 tablets. 12/22/16  Yes Smiley Houseman, MD  ondansetron (ZOFRAN) 4 MG tablet Take 1 tablet (4 mg total) by mouth every 6 (six) hours as needed for nausea. 10/27/17  Yes Smiley Houseman, MD  polyethylene glycol (MIRALAX / GLYCOLAX) packet Take 17 g by mouth 2 (two) times daily. 06/14/17  Yes Smiley Houseman, MD  simvastatin (ZOCOR) 20 MG tablet Take 1 tablet (20 mg total) by mouth at bedtime. 10/27/17  Yes Smiley Houseman, MD  traMADol (ULTRAM) 50 MG tablet Take 1 tablet (50 mg total) by mouth daily as needed (For pain.). Reported on 01/01/2016 10/27/17  Yes Smiley Houseman, MD  Fluticasone-Salmeterol (ADVAIR DISKUS) 250-50 MCG/DOSE AEPB Inhale 1 puff into the lungs 2 (two) times daily. 11/27/17   Carmin Muskrat, MD  predniSONE (DELTASONE) 20 MG tablet Take 2 tablets (40 mg total) by mouth daily with breakfast. For the next four days 11/27/17   Carmin Muskrat, MD    Family History Family History  Problem Relation Age of Onset  . Heart disease Mother   . Asthma Mother   . Cancer Mother        uterine   . Depression Mother   . Heart attack Mother 10  . Hyperlipidemia Mother   . Hypertension Mother   . Stroke Mother   . Kidney disease Mother   . Heart attack Sister   . Stroke Sister   . Depression Sister   . Diabetes Sister   . Hyperlipidemia Sister   . Depression Sister   . Diabetes Sister   . Hyperlipidemia Sister   . HIV/AIDS Sister   . Diabetes Brother   . Asthma Brother   . Hypertension Brother   . Cancer Maternal Aunt        lung  . Heart disease Maternal Grandmother   . Heart attack Maternal Grandmother   . Cancer Brother        colon  . HIV/AIDS Brother     Social  History Social History   Tobacco Use  . Smoking status: Current Some Day Smoker    Packs/day: 0.30    Years: 15.00    Pack years: 4.50    Types: Cigarettes    Start date: 01/01/1976  . Smokeless tobacco: Never Used  . Tobacco comment: pt states that she does not smoke everyday   Substance Use Topics  . Alcohol use: No    Alcohol/week: 0.0 oz  . Drug use: No     Allergies   Aspirin and Baclofen   Review of Systems Review  of Systems  Unable to perform ROS: Acuity of condition  HENT: Positive for nosebleeds.   Cardiovascular: Negative for chest pain.     Physical Exam Updated Vital Signs BP (!) 165/134   Pulse 98   Temp 97.9 F (36.6 C) (Temporal)   Resp 20   SpO2 100%   Physical Exam  Constitutional: She appears well-developed and well-nourished. She appears distressed.  Awake, alert, nontoxic appearance  HENT:  Head: Normocephalic and atraumatic.  Right Ear: There is hemotympanum.  Left Ear: There is hemotympanum.  Nose: Epistaxis is observed.  Mouth/Throat: No oropharyngeal exudate.  Large volume epistaxis with brisk bleeding from the left nare and left eye Posterior oropharynx with persistent large volume bloody drainage  Eyes: Conjunctivae are normal. Left eye exhibits discharge (bloody). No scleral icterus.  Neck: Normal range of motion. Neck supple. JVD present.  Cardiovascular: Regular rhythm and intact distal pulses. Tachycardia present.  Pulses:      Radial pulses are 2+ on the right side, and 2+ on the left side.  Pulmonary/Chest: Effort normal. No respiratory distress. She has rhonchi.  Equal chest expansion Scattered rhonchi  Abdominal: Soft. Bowel sounds are normal. She exhibits no mass. There is no tenderness. There is no rebound and no guarding.  Musculoskeletal: Normal range of motion. She exhibits no edema.  Neurological: She is alert.  Speech is clear and goal oriented Moves extremities without ataxia  Skin: Skin is warm and dry. She is  not diaphoretic.  Psychiatric: She has a normal mood and affect.  Nursing note and vitals reviewed.    ED Treatments / Results  Labs (all labs ordered are listed, but only abnormal results are displayed) Labs Reviewed  CBC WITH DIFFERENTIAL/PLATELET - Abnormal; Notable for the following components:      Result Value   WBC 71.8 (*)    RDW 17.2 (*)    Neutro Abs 11.5 (*)    Lymphs Abs 58.1 (*)    Monocytes Absolute 2.2 (*)    All other components within normal limits  COMPREHENSIVE METABOLIC PANEL - Abnormal; Notable for the following components:   CO2 21 (*)    Glucose, Bld 241 (*)    Creatinine, Ser 1.58 (*)    ALT 11 (*)    GFR calc non Af Amer 32 (*)    GFR calc Af Amer 37 (*)    All other components within normal limits  I-STAT CHEM 8, ED - Abnormal; Notable for the following components:   Creatinine, Ser 1.30 (*)    Glucose, Bld 241 (*)    Calcium, Ion 1.02 (*)    All other components within normal limits  PROTIME-INR  CBC  PREPARE RBC (CROSSMATCH)  TYPE AND SCREEN  PREPARE FRESH FROZEN PLASMA  PREPARE RBC (CROSSMATCH)  ABO/RH     Procedures .Epistaxis Management Date/Time: 11/28/2017 1:37 AM Performed by: Blanchie Dessert, MD Authorized by: Blanchie Dessert, MD   Consent:    Consent obtained:  Verbal   Consent given by:  Patient   Risks discussed:  Bleeding and pain Anesthesia (see MAR for exact dosages):    Anesthesia method:  None Procedure details:    Treatment site:  L posterior   Treatment method:  Nasal balloon   Treatment episode: initial   Post-procedure details:    Assessment:  Bleeding decreased   Patient tolerance of procedure:  Tolerated well, no immediate complications   (including critical care time)  CRITICAL CARE Performed by: Abigail Butts Total critical care time: 10  minutes Critical care time was exclusive of separately billable procedures and treating other patients. Critical care was necessary to treat or prevent  imminent or life-threatening deterioration. Critical care was time spent personally by me on the following activities: development of treatment plan with patient and/or surrogate as well as nursing, discussions with consultants, evaluation of patient's response to treatment, examination of patient, obtaining history from patient or surrogate, ordering and performing treatments and interventions, ordering and review of laboratory studies, ordering and review of radiographic studies, pulse oximetry and re-evaluation of patient's condition.   Medications Ordered in ED Medications  0.9 %  sodium chloride infusion ( Intravenous New Bag/Given 11/28/17 0134)  oxymetazoline (AFRIN) 0.05 % nasal spray 2 spray (2 sprays Each Nare Given 11/28/17 0117)  fentaNYL (SUBLIMAZE) injection 50 mcg (50 mcg Intravenous Given 11/28/17 0151)  hydrALAZINE (APRESOLINE) injection 10 mg (10 mg Intravenous Given 11/28/17 0203)  oxyCODONE-acetaminophen (PERCOCET/ROXICET) 5-325 MG per tablet 2 tablet (2 tablets Oral Given 11/28/17 0313)     Initial Impression / Assessment and Plan / ED Course  I have reviewed the triage vital signs and the nursing notes.  Pertinent labs & imaging results that were available during my care of the patient were reviewed by me and considered in my medical decision making (see chart for details).  Clinical Course as of Nov 28 713  Mon Nov 28, 2017  0242 Pt BP improved.  No persistent bleeding.  BP: 104/65 [HM]  0622 No rebleed at this time.  [HM]  0622 Blood pressure improved BP: 136/73 [HM]  3154 Pt has PCP follow-up already planned for tomorrow  [HM]  0086 Pt ambulates without rebleed.  She has tolerated PO without difficulty.  She ambulates without lightheadedness or dizziness.    [HM]    Clinical Course User Index [HM] Elona Yinger, Gwenlyn Perking    Vision presents with large volume epistaxis.  She was given 1 unit of emergency release blood due to the large volume of persistent  bleeding.  She does take Plavix regularly.  She is hypertensive on arrival.  Oxymetazoline given and packing placed with slowing of the bleeding.  Repeat checks show bleeding has stopped at this time.  Blood pressure is decreased after hydralazine.    7:14 AM Awaiting reconsult with ENT and repeat CBC.  Plan for d/c home with close ENT follow-up.  The patient was discussed with and seen by Dr. Maryan Rued who agrees with the treatment plan.   Final Clinical Impressions(s) / ED Diagnoses   Final diagnoses:  Hypertension, unspecified type  Epistaxis  Leukocytosis, unspecified type    ED Discharge Orders    None       Agapito Games 11/28/17 [place], MD 12/01/17 2108

## 2017-11-28 NOTE — ED Notes (Signed)
Dr. Maryan Rued applied Rhino Rocket packing at left nare after spraying with Afrin.

## 2017-11-28 NOTE — ED Notes (Signed)
EDP notified on pt.'s persistent hypertension . 

## 2017-11-28 NOTE — Discharge Instructions (Signed)
1. Hold your plavix for 1-2 week but be sure to speak with your doctor tomorrow about how long you can safely be off the plavix (clopidegrel) 2.  Take the amoxicillin 3 times a day to prevent infection 3. Have the packing removed in 5 days by ENT 4. You can use ocean nasal spray in the right nostril for any congestion but also squirt it on the packing to keep it moist

## 2017-11-28 NOTE — ED Notes (Signed)
Patient completed emergent 1 unit PRBC with no adverse effect , respirations unlabored ,, IV sites intact .

## 2017-11-28 NOTE — ED Notes (Signed)
Patient is resting on stretcher no bleeding at present. Waiting on husband to bring her clothes to go home.

## 2017-11-28 NOTE — ED Notes (Signed)
Patient ambulated with no difficulty or assistance.

## 2017-11-29 ENCOUNTER — Encounter: Payer: Self-pay | Admitting: Internal Medicine

## 2017-11-29 ENCOUNTER — Ambulatory Visit (INDEPENDENT_AMBULATORY_CARE_PROVIDER_SITE_OTHER): Payer: Medicare Other | Admitting: Internal Medicine

## 2017-11-29 ENCOUNTER — Other Ambulatory Visit: Payer: Self-pay

## 2017-11-29 VITALS — BP 138/80 | HR 84 | Temp 98.9°F | Wt 128.0 lb

## 2017-11-29 DIAGNOSIS — M549 Dorsalgia, unspecified: Secondary | ICD-10-CM | POA: Diagnosis not present

## 2017-11-29 DIAGNOSIS — G8929 Other chronic pain: Secondary | ICD-10-CM

## 2017-11-29 DIAGNOSIS — I739 Peripheral vascular disease, unspecified: Secondary | ICD-10-CM

## 2017-11-29 DIAGNOSIS — R252 Cramp and spasm: Secondary | ICD-10-CM

## 2017-11-29 LAB — TYPE AND SCREEN
ABO/RH(D): A POS
Antibody Screen: NEGATIVE
Unit division: 0
Unit division: 0
Unit division: 0

## 2017-11-29 LAB — BPAM RBC
Blood Product Expiration Date: 201812182359
Blood Product Expiration Date: 201812182359
Blood Product Expiration Date: 201812192359
ISSUE DATE / TIME: 201812030106
ISSUE DATE / TIME: 201812030106
Unit Type and Rh: 6200
Unit Type and Rh: 9500
Unit Type and Rh: 9500

## 2017-11-29 MED ORDER — TRAMADOL HCL 50 MG PO TABS: 50.0000 mg | ORAL_TABLET | Freq: Every day | ORAL | 0 refills | Status: DC | PRN

## 2017-11-29 MED ORDER — CYCLOBENZAPRINE HCL 10 MG PO TABS: 10.0000 mg | ORAL_TABLET | Freq: Every day | ORAL | 0 refills | Status: DC | PRN

## 2017-11-29 NOTE — Progress Notes (Signed)
   [facility] Phone: [phone]   Date of Visit: 11/29/2017   HPI:  ED follow-up for epistaxis: -Seen in the ED on 12/3 for large volume epistaxis. she was given 1 unit of emergency release blood.  Her Plavix was held at that time.  Packing was placed which stopped the bleeding. -She is doing okay with no further bleeding since the discharge from the ED.  She has a follow-up with ENT in the next few days.  She was also started on Keflex for this.  Muscle cramps and back pain: -This is a chronic issue that seems to have worsened recently.  She reports of back pain and lower extremity muscle cramps.  She feels like she cannot walk by herself.  She walks with a walker or cane in the past few months.  She denies any numbness other than the left inner thigh that since her knee surgery.  No urinary retention or incontinence.  No fecal incontinence. -MRI obtained 2016 shows bulging disc and shallow central disc protrusions at L4-5 and L5-S1.  Mild bilateral lateral recess stenosis at L5-S1 could potentially involve S1 nerve roots.   ROS: See HPI.  [place]:  PMH: DM2 HTN PVD COPD GER CKD3 CLL HLD Tobacco Dependence Chronic Back Pain Tension HA  PHYSICAL EXAM: BP 138/80   Pulse 84   Temp 98.9 F (37.2 C) (Oral)   Wt 128 lb (58.1 kg)   SpO2 99%   BMI 23.41 kg/m  GEN: NAD CV: RRR, no murmurs, rubs, or gallops PULM: CTAB, normal effort ABD: Soft, nontender, nondistended, NABS, no organomegaly MSK: No midline spinal tenderness.  Left lumbar paraspinal tenderness and over the SI joint.  Hip range of motion is normal.  No pain with internal or external rotation of the left hip.  Seated straight leg raise is negative.  Strength is normal in bilateral lower extremities.  Left DP pulse is palpable.  Right DP pulse is not palpable.  Both feet are warm to touch. SKIN: No rash or cyanosis; warm and well-perfused EXTR: No lower extremity edema or calf  tenderness PSYCH: Mood and affect euthymic, normal rate and volume of speech NEURO: Awake, alert, no focal deficits grossly, normal speech  ASSESSMENT/PLAN:   PERIPHERAL VASCULAR DISEASE Referral to Dr. Valentina Lucks for ABI testing as I cannot feel her right DP pulse.  Chronic back pain Refilled pain medication and muscle relaxer.  For her muscle spasms electrolytes were normal recently.  We will check a mag level today.  Encouraged proper oral hydration which might contribute to her muscle cramps.  After obtaining BMP to check for creatinine we will start her on Cymbalta 30 mg daily.  Made a referral for orthopedics in the near future.  Patient is having financial difficulties and her referral to oncology for CLL is more important currently.    Smiley Houseman, MD PGY [place]

## 2017-11-29 NOTE — Patient Instructions (Addendum)
Please make an appointment with Dr. Valentina Lucks for ABI of the Right Leg.    We will get labs today.  Make sure to stay hydrated as this can worsen your muscle cramps.   Follow up with me after you get the test with Dr. Valentina Lucks.

## 2017-11-30 ENCOUNTER — Encounter: Payer: Self-pay | Admitting: Internal Medicine

## 2017-11-30 ENCOUNTER — Telehealth: Payer: Self-pay | Admitting: Hematology

## 2017-11-30 ENCOUNTER — Telehealth: Payer: Self-pay | Admitting: Internal Medicine

## 2017-11-30 LAB — BASIC METABOLIC PANEL
BUN/Creatinine Ratio: 9 — ABNORMAL LOW (ref 12–28)
BUN: 9 mg/dL (ref 8–27)
CO2: 28 mmol/L (ref 20–29)
Calcium: 9.1 mg/dL (ref 8.7–10.3)
Chloride: 102 mmol/L (ref 96–106)
Creatinine, Ser: 1.03 mg/dL — ABNORMAL HIGH (ref 0.57–1.00)
GFR calc Af Amer: 64 mL/min/{1.73_m2} (ref >59)
GFR calc non Af Amer: 55 mL/min/{1.73_m2} — ABNORMAL LOW (ref >59)
Glucose: 161 mg/dL — ABNORMAL HIGH (ref 65–99)
Potassium: 4.5 mmol/L (ref 3.5–5.2)
Sodium: 144 mmol/L (ref 134–144)

## 2017-11-30 LAB — MAGNESIUM: Magnesium: 2 mg/dL (ref 1.6–2.3)

## 2017-11-30 MED ORDER — DULOXETINE HCL 30 MG PO CPEP: 30.0000 mg | ORAL_CAPSULE | Freq: Every day | ORAL | 0 refills | Status: DC

## 2017-11-30 NOTE — Assessment & Plan Note (Addendum)
Refilled pain medication and muscle relaxer.  For her muscle spasms electrolytes were normal recently.  We will check a mag level today.  Encouraged proper oral hydration which might contribute to her muscle cramps.  After obtaining BMP to check for creatinine we will start her on Cymbalta 30 mg daily.  Made a referral for orthopedics in the near future.  Patient is having financial difficulties and her referral to oncology for CLL is more important currently.

## 2017-11-30 NOTE — Telephone Encounter (Signed)
Called patient to inform creatinine is back down.  Because of this we will start Cymbalta 30 mg daily for chronic pain.  Patient aware of symptoms to monitor since she is on tramadol as needed.  Additionally I informed her that the referral to hematology oncology was sent to [facility] and provided her the phone number to call them and see if she can make an appointment.  Questions answered.

## 2017-11-30 NOTE — Telephone Encounter (Signed)
Appt has been scheduled for the pt to see Dr. Irene Limbo on 12/18 at [place] a message to the referring office to notify the pt.

## 2017-11-30 NOTE — Assessment & Plan Note (Signed)
Referral to Dr. Valentina Lucks for ABI testing as I cannot feel her right DP pulse.

## 2017-12-01 ENCOUNTER — Encounter: Payer: Self-pay | Admitting: Pharmacist

## 2017-12-01 ENCOUNTER — Ambulatory Visit (INDEPENDENT_AMBULATORY_CARE_PROVIDER_SITE_OTHER): Payer: Medicare Other | Admitting: Pharmacist

## 2017-12-01 DIAGNOSIS — F17200 Nicotine dependence, unspecified, uncomplicated: Secondary | ICD-10-CM

## 2017-12-01 DIAGNOSIS — I739 Peripheral vascular disease, unspecified: Secondary | ICD-10-CM

## 2017-12-01 NOTE — Progress Notes (Signed)
Patient ID: [name] [name], female   DOB: 1946-02-13, 71 y.o.   MRN: [id number] Reviewed: Agree with Dr. Graylin Shiver documentation and management.

## 2017-12-01 NOTE — Assessment & Plan Note (Signed)
Severe and significant PAD based on ABI < 0.60 in Right leg in a patient with multiple claudication symptoms and history of leg stents.   No longer taking cilostazole or clopidogrel due to recent GI bleed.  Continue to smoke 2-3 cigarettes per day at this time.   Reluctant to set a quit date but verbalized "need to quit".   Unwilling to set a quit date in the next 2-4 weeks.  No medication adjustment at this time.  Encouraged complete tobacco cessation as the most important factor to improve risk and symptoms.   Results reviewed and written information provided.

## 2017-12-01 NOTE — Assessment & Plan Note (Signed)
Reluctant to set a quit date but verbalized "need to quit".   Unwilling to set a quit date in the next 2-4 weeks.  No medication adjustment at this time.  Encouraged complete tobacco cessation as the most important factor to improve CV risk and PAD symptoms.

## 2017-12-01 NOTE — Patient Instructions (Signed)
Great to see you today.   Leg test was abnormal.   Please work hard at QUITTING Smoking completely.   Next visit with Dr. Dallas Schimke within the next month.

## 2017-12-01 NOTE — Progress Notes (Signed)
    S:    Patient arrives in good spirits, accompanied by her husband [name], walking unassisted, wearing medical mask to cover her NG tube.    She presents to the clinic for PADABI evaluation.  She reports stent placement in the past in both legs.  Patient was referred on 11/29/2017.  Patient was last seen by Primary Care Provider on 11/29/2017.   Reports pain with walking.  Pain is described as numbness, weakness, dull/ aching, and cramping which occurs after exercise and at times with rest.  Pain is localized to right calf.  Patient was recently discharged from hospital and all medications have been reviewed.  O:  Lower extremity Physical Exam includes diminished pulsed bilaterally, absence of limb hair, current ulceration on right foreleg due to trauma of "bumping leg on husband's truck",  thickened brittle nails.) Right is worse than left - with pain.   ABI overall = 0.53 Right Arm 135mmHg    Left Arm 144 mmHg Right ankle posterior tibial 40 mmHg     dorsalis pedis 76 mmHg Left ankle posterior tibial 120 mmHg    dorsalis pedis 118 mmHg   A/P: Severe and significant PAD based on ABI < 0.60 in Right leg in a patient with multiple claudication symptoms and history of leg stents.   No longer taking cilostazole or clopidogrel due to recent GI bleed.  Continue to smoke 2-3 cigarettes per day at this time.   Reluctant to set a quit date but verbalized "need to quit".   Unwilling to set a quit date in the next 2-4 weeks.  No medication adjustment at this time.  Encouraged complete tobacco cessation as the most important factor to improve risk and symptoms.   Results reviewed and written information provided.   F/U Clinic Visit with Dr.Gunadasa. Total time in face-to-face counseling 35 minutes.

## 2017-12-02 ENCOUNTER — Encounter (HOSPITAL_COMMUNITY): Payer: Self-pay | Admitting: Emergency Medicine

## 2017-12-02 ENCOUNTER — Other Ambulatory Visit: Payer: Self-pay

## 2017-12-02 ENCOUNTER — Emergency Department (HOSPITAL_COMMUNITY)
Admission: EM | Admit: 2017-12-02 | Discharge: 2017-12-02 | Disposition: A | Discharge status: Home / Self Care | Payer: Medicare Other | Attending: Emergency Medicine | Admitting: Emergency Medicine

## 2017-12-02 DIAGNOSIS — Z7902 Long term (current) use of antithrombotics/antiplatelets: Secondary | ICD-10-CM | POA: Diagnosis not present

## 2017-12-02 DIAGNOSIS — Z79899 Other long term (current) drug therapy: Secondary | ICD-10-CM | POA: Insufficient documentation

## 2017-12-02 DIAGNOSIS — Z7984 Long term (current) use of oral hypoglycemic drugs: Secondary | ICD-10-CM | POA: Diagnosis not present

## 2017-12-02 DIAGNOSIS — I129 Hypertensive chronic kidney disease with stage 1 through stage 4 chronic kidney disease, or unspecified chronic kidney disease: Secondary | ICD-10-CM | POA: Insufficient documentation

## 2017-12-02 DIAGNOSIS — J45909 Unspecified asthma, uncomplicated: Secondary | ICD-10-CM | POA: Insufficient documentation

## 2017-12-02 DIAGNOSIS — Z4800 Encounter for change or removal of nonsurgical wound dressing: Secondary | ICD-10-CM | POA: Insufficient documentation

## 2017-12-02 DIAGNOSIS — E119 Type 2 diabetes mellitus without complications: Secondary | ICD-10-CM | POA: Diagnosis not present

## 2017-12-02 DIAGNOSIS — C9110 Chronic lymphocytic leukemia of B-cell type not having achieved remission: Secondary | ICD-10-CM | POA: Diagnosis not present

## 2017-12-02 DIAGNOSIS — F17210 Nicotine dependence, cigarettes, uncomplicated: Secondary | ICD-10-CM | POA: Diagnosis not present

## 2017-12-02 DIAGNOSIS — N183 Chronic kidney disease, stage 3 (moderate): Secondary | ICD-10-CM | POA: Diagnosis not present

## 2017-12-02 NOTE — ED Notes (Signed)
Patient is A&Ox4.  No signs of distress noted.  Please see providers complete history and physical exam.  

## 2017-12-02 NOTE — ED Provider Notes (Signed)
[facility] EMERGENCY DEPARTMENT Provider Note   CSN: [id number] Arrival date & time: 12/02/17  1737     History   Chief Complaint Chief Complaint  Patient presents with  . Follow-up    nasal packing removal    HPI [name] [name] is a 71 y.o. female presenting for removal of nasal packing.  Patient states that nasal packing was placed in ED on Monday.  She called the ENT on Monday, to schedule an appointment for yesterday to get it removed.  ENT told her the packing needs to be in for 5 days.  They were concerned that their office would not be open on Monday, and suggested she come to the ER.  Patient states she used to get frequent nosebleeds but has not had one in a long time.  She states the packing is extremely uncomfortable, and she would like it out today.  She is not on blood thinners. She denies other complaints today.  HPI  Past Medical History:  Diagnosis Date  . Asthma   . CLL (chronic lymphoblastic leukemia)   . Constipation 11/15/2011  . COPD (chronic obstructive pulmonary disease) ([place])   . Diabetes mellitus without complication ([place])   . GERD (gastroesophageal reflux disease)   . Hyperlipidemia   . Hypertension   . PVD (peripheral vascular disease) ([place])   . Stroke [facility])    in 1998 due to tumor     Patient Active Problem List   Diagnosis Date Noted  . Enteritis   . Bradycardia 07/01/2016  . Chronic kidney disease (CKD), stage III (moderate) ([place]) 06/04/2016  . Shoulder pain, left 04/06/2016  . Chest pain 02/06/2016  . Chronic back pain 10/02/2015  . Acute right hip pain 03/13/2012  . Episodic tension type headache 09/24/2010  . GAIT IMBALANCE 12/10/2009  . DM (diabetes mellitus), type 2 with peripheral vascular complications ([place]) 45/80/9983  . Chronic lymphocytic leukemia ([place]) 04/11/2009  . Adjustment disorder with depressed mood 12/05/2008  . PERIPHERAL VASCULAR DISEASE 10/13/2007  . HYPERCHOLESTEROLEMIA 02/23/2007  . ANXIETY  02/23/2007  . TOBACCO DEPENDENCE 02/23/2007  . HYPERTENSION, BENIGN SYSTEMIC 02/23/2007  . COPD 02/23/2007  . GASTROESOPHAGEAL REFLUX, NO ESOPHAGITIS 02/23/2007    Past Surgical History:  Procedure Laterality Date  . ABI  02/2012   ABI <0.65 BL 02/2012  . BRAIN MENINGIOMA EXCISION    . Bypass grafting of RLE for PAD claudication     . CESAREAN SECTION     1974, 78 ,80  . CHOLECYSTECTOMY, LAPAROSCOPIC    . ESOPHAGEAL DILATION      OB History    No data available       Home Medications    Prior to Admission medications   Medication Sig Start Date End Date Taking? Authorizing Provider  albuterol (PROVENTIL HFA;VENTOLIN HFA) 108 (90 Base) MCG/ACT inhaler Inhale 2 puffs into the lungs every 4 (four) hours as needed for wheezing or shortness of breath. 10/26/16   Smiley Houseman, MD  amoxicillin (AMOXIL) 500 MG capsule Take 1 capsule (500 mg total) by mouth 3 (three) times daily. 11/28/17   Blanchie Dessert, MD  cilostazol (PLETAL) 50 MG tablet Take 1 tablet (50 mg total) by mouth 2 (two) times daily. Patient not taking: Reported on 12/01/2017 10/27/17   Smiley Houseman, MD  clopidogrel (PLAVIX) 75 MG tablet Take 1 tablet (75 mg total) by mouth daily. Patient not taking: Reported on 11/29/2017 10/27/17   Smiley Houseman, MD  cyclobenzaprine (FLEXERIL) 10 MG  tablet Take 1 tablet (10 mg total) by mouth daily as needed for muscle spasms. 11/29/17   Smiley Houseman, MD  DULoxetine (CYMBALTA) 30 MG capsule Take 1 capsule (30 mg total) by mouth daily. 11/30/17   Smiley Houseman, MD  enalapril (VASOTEC) 20 MG tablet Take 1 tablet (20 mg total) by mouth daily. 10/27/17   Smiley Houseman, MD  famotidine (PEPCID) 20 MG tablet Take 1 tablet (20 mg total) by mouth daily. 10/27/17   Smiley Houseman, MD  Fluticasone-Salmeterol (ADVAIR DISKUS) 250-50 MCG/DOSE AEPB Inhale 1 puff into the lungs 2 (two) times daily. 11/27/17   Carmin Muskrat, MD  hydrochlorothiazide  (MICROZIDE) 12.5 MG capsule Take 1 capsule (12.5 mg total) by mouth daily. 10/27/17   Smiley Houseman, MD  loratadine (CLARITIN) 10 MG tablet Take 1 tablet (10 mg total) by mouth daily. Patient not taking: Reported on 12/01/2017 06/14/17   Smiley Houseman, MD  metFORMIN (GLUCOPHAGE) 500 MG tablet TAKE 1 TABLET TWICE A DAY  WITH A MEAL 10/27/17   Smiley Houseman, MD  metoprolol tartrate (LOPRESSOR) 25 MG tablet Take 1 tablet (25 mg total) by mouth 2 (two) times daily. Patient taking differently: Take 12.5 mg by mouth 2 (two) times daily.  10/27/17   Smiley Houseman, MD  montelukast (SINGULAIR) 10 MG tablet Take 1 tablet (10 mg total) by mouth at bedtime. Patient not taking: Reported on 12/01/2017 06/14/17   Smiley Houseman, MD  nitroGLYCERIN (NITROSTAT) 0.4 MG SL tablet Place 1 tablet (0.4 mg total) under the tongue every 5 (five) minutes as needed for chest pain. Only take up to 3 tablets. 12/22/16   Smiley Houseman, MD  ondansetron (ZOFRAN) 4 MG tablet Take 1 tablet (4 mg total) by mouth every 6 (six) hours as needed for nausea. 10/27/17   Smiley Houseman, MD  polyethylene glycol (MIRALAX / GLYCOLAX) packet Take 17 g by mouth 2 (two) times daily. Patient taking differently: Take 17 g by mouth daily as needed.  06/14/17   Smiley Houseman, MD  predniSONE (DELTASONE) 20 MG tablet Take 2 tablets (40 mg total) by mouth daily with breakfast. For the next four days 11/27/17   Carmin Muskrat, MD  simvastatin (ZOCOR) 20 MG tablet Take 1 tablet (20 mg total) by mouth at bedtime. 10/27/17   Smiley Houseman, MD  traMADol (ULTRAM) 50 MG tablet Take 1 tablet (50 mg total) by mouth daily as needed (For pain.). Reported on 01/01/2016 Patient not taking: Reported on 12/01/2017 11/29/17   Smiley Houseman, MD    Family History Family History  Problem Relation Age of Onset  . Heart disease Mother   . Asthma Mother   . Cancer Mother        uterine   . Depression Mother   .  Heart attack Mother 61  . Hyperlipidemia Mother   . Hypertension Mother   . Stroke Mother   . Kidney disease Mother   . Heart attack Sister   . Stroke Sister   . Depression Sister   . Diabetes Sister   . Hyperlipidemia Sister   . Depression Sister   . Diabetes Sister   . Hyperlipidemia Sister   . HIV/AIDS Sister   . Diabetes Brother   . Asthma Brother   . Hypertension Brother   . Cancer Maternal Aunt        lung  . Heart disease Maternal Grandmother   . Heart attack Maternal Grandmother   .  Cancer Brother        colon  . HIV/AIDS Brother     Social History Social History   Tobacco Use  . Smoking status: Current Some Day Smoker    Packs/day: 0.10    Years: 30.00    Pack years: 3.00    Types: Cigarettes    Start date: 01/01/1976  . Smokeless tobacco: Never Used  . Tobacco comment: pt states that she does not smoke everyday   Substance Use Topics  . Alcohol use: No    Alcohol/week: 0.0 oz  . Drug use: No     Allergies   Aspirin and Baclofen   Review of Systems Review of Systems  Constitutional: Negative for chills and fever.  HENT: Negative for postnasal drip.        Nose discomfort  Respiratory: Negative for cough.      Physical Exam Updated Vital Signs BP (!) 147/63 (BP Location: Left Arm)   Pulse 86   Temp 98.2 F (36.8 C) (Oral)   Resp 19   Ht 5\' 3"  (1.6 m)   Wt 58.5 kg (129 lb)   SpO2 100%   BMI 22.85 kg/m   Physical Exam  Constitutional: She is oriented to person, place, and time. She appears well-developed and well-nourished. No distress.  HENT:  Head: Normocephalic and atraumatic.  Packing of left nare.  No drainage from the packing.  Eyes: EOM are normal.  Neck: Normal range of motion.  Pulmonary/Chest: Effort normal.  Abdominal: She exhibits no distension.  Musculoskeletal: Normal range of motion.  Neurological: She is alert and oriented to person, place, and time.  Skin: Skin is warm. No rash noted.  Psychiatric: She has a  normal mood and affect.  Nursing note and vitals reviewed.    ED Treatments / Results  Labs (all labs ordered are listed, but only abnormal results are displayed) Labs Reviewed - No data to display  EKG  EKG Interpretation None       Radiology No results found.  Procedures .Epistaxis Management Date/Time: 12/02/2017 7:38 PM Performed by: Franchot Heidelberg, PA-C Authorized by: Franchot Heidelberg, PA-C   Consent:    Consent obtained:  Verbal   Consent given by:  Patient   Risks discussed:  Bleeding, nasal injury and pain   Alternatives discussed:  No treatment, delayed treatment and referral Comments:     Packing balloons deflated and packing removed from left nare.  Minimal blood-tinged streaking along packing.  No bleeding after packing was removed.    (including critical care time)    Medications Ordered in ED Medications - No data to display   Initial Impression / Assessment and Plan / ED Course  I have reviewed the triage vital signs and the nursing notes.  Pertinent labs & imaging results that were available during my care of the patient were reviewed by me and considered in my medical decision making (see chart for details).     Patient presenting with request for removal of nasal packing.  No other complaints.  Discussed that this should be done by ENT Dr.  Discussed that if I remove the packing today, there is a risk that she will continue to bleed, or that she may go home and bleeding will start again.  Patient states she understands, but she wants the packing out.  I recommended that she wait, but patient was adamant that she wanted the packing out, and stated that she understood that she could continue to bleed or start to  rebleed.  Packing removed.  No obvious bleeding after removal.  No obvious abnormalities noted on exam after removal.  Patient denies postnasal drip or feeling like blood is dripping down her throat.  She has no complaints after removal.   Patient to follow-up with ENT for further evaluation.  At this time, patient appears safe for discharge.  Return precautions given.  Patient states she understands and agrees to plan.   Final Clinical Impressions(s) / ED Diagnoses   Final diagnoses:  Encounter for removal of nasal packing    ED Discharge Orders    None       Franchot Heidelberg, PA-C 12/03/17 0205    Milton Ferguson, MD 12/03/17 [phone]

## 2017-12-02 NOTE — Discharge Instructions (Signed)
If your nose starts bleeding again, pinch it tightly and hold for at least 20 minutes. Follow-up with the ear nose and throat doctor for further evaluation of your nose. Try not to touch, pick at, irritate, or blow your nose. Return to the emergency room if you develop persistent nosebleeds, fever, chills, pain, or any new or worsening symptoms.

## 2017-12-02 NOTE — ED Triage Notes (Signed)
Pt had nasal packing placed on Monday and was told to have it removed yesterday at ENT, when pt called them to set up removal of packing they stated they could not remove packing until Monday so pt decided to come here for removal instead.

## 2017-12-02 NOTE — ED Notes (Signed)
PT states understanding of care given, follow up care, and medication prescribed. PT ambulated from ED to car with a steady gait. 

## 2017-12-13 ENCOUNTER — Encounter: Payer: Self-pay | Admitting: Hematology

## 2017-12-13 ENCOUNTER — Other Ambulatory Visit (HOSPITAL_COMMUNITY)
Admission: RE | Admit: 2017-12-13 | Discharge: 2017-12-13 | Disposition: A | Discharge status: Home / Self Care | Payer: Medicare Other | Source: Ambulatory Visit | Attending: Hematology | Admitting: Hematology

## 2017-12-13 ENCOUNTER — Telehealth: Payer: Self-pay | Admitting: Hematology

## 2017-12-13 ENCOUNTER — Ambulatory Visit (HOSPITAL_BASED_OUTPATIENT_CLINIC_OR_DEPARTMENT_OTHER): Payer: Medicare Other

## 2017-12-13 ENCOUNTER — Ambulatory Visit: Payer: Medicare Other | Admitting: Hematology

## 2017-12-13 VITALS — BP 130/62 | HR 70 | Temp 97.9°F | Resp 14 | Ht 63.0 in | Wt 129.5 lb

## 2017-12-13 DIAGNOSIS — Z720 Tobacco use: Secondary | ICD-10-CM

## 2017-12-13 DIAGNOSIS — M545 Low back pain: Secondary | ICD-10-CM | POA: Diagnosis not present

## 2017-12-13 DIAGNOSIS — D72820 Lymphocytosis (symptomatic): Secondary | ICD-10-CM | POA: Diagnosis not present

## 2017-12-13 DIAGNOSIS — Z808 Family history of malignant neoplasm of other organs or systems: Secondary | ICD-10-CM

## 2017-12-13 DIAGNOSIS — C9110 Chronic lymphocytic leukemia of B-cell type not having achieved remission: Secondary | ICD-10-CM

## 2017-12-13 DIAGNOSIS — Z801 Family history of malignant neoplasm of trachea, bronchus and lung: Secondary | ICD-10-CM

## 2017-12-13 DIAGNOSIS — R109 Unspecified abdominal pain: Secondary | ICD-10-CM

## 2017-12-13 DIAGNOSIS — C9190 Lymphoid leukemia, unspecified not having achieved remission: Secondary | ICD-10-CM | POA: Diagnosis not present

## 2017-12-13 LAB — CBC & DIFF AND RETIC
BASO%: 0.6 % (ref 0.0–2.0)
Basophils Absolute: 0.4 10*3/uL — ABNORMAL HIGH (ref 0.0–0.1)
EOS%: 0.2 % (ref 0.0–7.0)
Eosinophils Absolute: 0.1 10*3/uL (ref 0.0–0.5)
HCT: 34.5 % — ABNORMAL LOW (ref 34.8–46.6)
HGB: 10.8 g/dL — ABNORMAL LOW (ref 11.6–15.9)
Immature Retic Fract: 25.7 % — ABNORMAL HIGH (ref 1.60–10.00)
LYMPH%: 75.1 % — ABNORMAL HIGH (ref 14.0–49.7)
MCH: 27.8 pg (ref 25.1–34.0)
MCHC: 31.3 g/dL — ABNORMAL LOW (ref 31.5–36.0)
MCV: 88.9 fL (ref 79.5–101.0)
MONO#: 2.3 10*3/uL — ABNORMAL HIGH (ref 0.1–0.9)
MONO%: 3.8 % (ref 0.0–14.0)
NEUT#: 11.9 10*3/uL — ABNORMAL HIGH (ref 1.5–6.5)
NEUT%: 20.3 % — ABNORMAL LOW (ref 38.4–76.8)
Platelets: 362 10*3/uL (ref 145–400)
RBC: 3.88 10*6/uL (ref 3.70–5.45)
RDW: 16.5 % — ABNORMAL HIGH (ref 11.2–14.5)
Retic %: 2.84 % — ABNORMAL HIGH (ref 0.70–2.10)
Retic Ct Abs: 110.19 10*3/uL — ABNORMAL HIGH (ref 33.70–90.70)
WBC: 58.9 10*3/uL (ref 3.9–10.3)
lymph#: 44.2 10*3/uL — ABNORMAL HIGH (ref 0.9–3.3)
nRBC: 0 % (ref 0–0)

## 2017-12-13 LAB — TECHNOLOGIST REVIEW

## 2017-12-13 LAB — COMPREHENSIVE METABOLIC PANEL
ALT: 8 U/L (ref 0–55)
AST: 11 U/L (ref 5–34)
Albumin: 3.6 g/dL (ref 3.5–5.0)
Alkaline Phosphatase: 133 U/L (ref 40–150)
Anion Gap: 8 mEq/L (ref 3–11)
BUN: 10.2 mg/dL (ref 7.0–26.0)
CO2: 30 mEq/L — ABNORMAL HIGH (ref 22–29)
Calcium: 8.9 mg/dL (ref 8.4–10.4)
Chloride: 105 mEq/L (ref 98–109)
Creatinine: 1.3 mg/dL — ABNORMAL HIGH (ref 0.6–1.1)
EGFR: 46 mL/min/{1.73_m2} — ABNORMAL LOW (ref >60)
Glucose: 98 mg/dl (ref 70–140)
Potassium: 4.5 mEq/L (ref 3.5–5.1)
Sodium: 142 mEq/L (ref 136–145)
Total Bilirubin: 0.27 mg/dL (ref 0.20–1.20)
Total Protein: 7 g/dL (ref 6.4–8.3)

## 2017-12-13 LAB — LACTATE DEHYDROGENASE: LDH: 205 U/L (ref 125–245)

## 2017-12-13 LAB — CHCC SMEAR

## 2017-12-13 NOTE — Telephone Encounter (Signed)
Scheduled appt per 12/18 los - Gave patient AVS and calender per los. Central radiology to contact patient with ct schedule.

## 2017-12-13 NOTE — Progress Notes (Signed)
HEMATOLOGY/ONCOLOGY CONSULTATION NOTE  Date of Service: 12/13/2017  Patient Care Team: Smiley Houseman, MD as PCP - General (Family Medicine) Dr. Nyoka Cowden (Dentistry) [place], Eppie Gibson, RN as [place] Management  CHIEF COMPLAINTS/PURPOSE OF CONSULTATION:  lymphocytosis  HISTORY OF PRESENTING ILLNESS:   [name] [name] is a wonderful 71 y.o. female smoker with history of CLL who has been referred to [place] by Dr Dallas Schimke, Almond Lint, MD at [facility] for evaluation and management of elevated WBC/lymphocytes.   She is accompanied by her husband. She reported to ED on 11/27/2017 with cough and abdominal pain with COPD exacerbation. Her labs on this day showed WBC elevated at 62.5. She has not previously seen a hematologist. She reports a plan to quit smoking on the first day of the new year. She reports worsening decrease in balance and states she has a cane and walker that she uses as needed. She was taking Prednisone.  No weight loss/fevers/chills/night sweats/ palpable lumps or bumps.  On review of systems, pt reports back pain, change in balance, abdominal pain, weight loss and denies changes in BM, fever and any other accompanying symptoms.  MEDICAL HISTORY:  Past Medical History:  Diagnosis Date   Asthma    CLL (chronic lymphoblastic leukemia)    Constipation 11/15/2011   COPD (chronic obstructive pulmonary disease) ([facility])    Diabetes mellitus without complication ([facility])    GERD (gastroesophageal reflux disease)    Hyperlipidemia    Hypertension    PVD (peripheral vascular disease) ([place])    Stroke ([place])    in 1998 due to tumor     SURGICAL HISTORY: Past Surgical History:  Procedure Laterality Date   ABI  02/2012   ABI <0.65 BL 02/2012   BRAIN MENINGIOMA EXCISION     Bypass grafting of RLE for PAD claudication      CESAREAN SECTION     1974, 78 ,80   CHOLECYSTECTOMY, LAPAROSCOPIC     ESOPHAGEAL DILATION      SOCIAL  HISTORY: Social History   Socioeconomic History   Marital status: Married    Spouse name: Not on file   Number of children: 3   Years of education: Not on file   Highest education level: Not on file  Social Needs   Financial resource strain: Not on file   Food insecurity - worry: Not on file   Food insecurity - inability: Not on file   Transportation needs - medical: Not on file   Transportation needs - non-medical: Not on file  Occupational History   Occupation: DISABLED    Employer: DISABLED  Tobacco Use   Smoking status: Current Some Day Smoker    Packs/day: 0.10    Years: 30.00    Pack years: 3.00    Types: Cigarettes    Start date: 01/01/1976   Smokeless tobacco: Never Used   Tobacco comment: pt states that she does not smoke everyday   Substance and Sexual Activity   Alcohol use: No    Alcohol/week: 0.0 oz   Drug use: No   Sexual activity: Yes  Other Topics Concern   Not on file  Social History Narrative   Health Care POA:    Emergency Contact: Val Riles [phone] (c)   End of Life Plan:    Who lives with you: Lives with husband   Any pets: none   Diet: Patient lacks financial resources for much food. Pt reports eating what is available.   Exercise:  Patient does not have an exercise plan.   Seatbelts: Patient reports wearing seatbelt when in vehicle.    [name] Exposure/Protection:   Hobbies: Bowling, computer games, Bingo      Has financial difficulties and transportation issues as she and her husband share transportation        FAMILY HISTORY: Family History  Problem Relation Age of Onset   Heart disease Mother    Asthma Mother    Cancer Mother        uterine    Depression Mother    Heart attack Mother 83   Hyperlipidemia Mother    Hypertension Mother    Stroke Mother    Kidney disease Mother    Heart attack Sister    Stroke Sister    Depression Sister    Diabetes Sister    Hyperlipidemia Sister    Depression  Sister    Diabetes Sister    Hyperlipidemia Sister    HIV/AIDS Sister    Diabetes Brother    Asthma Brother    Hypertension Brother    Cancer Maternal Aunt        lung   Heart disease Maternal Grandmother    Heart attack Maternal Grandmother    Cancer Brother        colon   HIV/AIDS Brother     ALLERGIES:  is allergic to aspirin and baclofen.  MEDICATIONS:  Current Outpatient Medications  Medication Sig Dispense Refill   albuterol (PROVENTIL HFA;VENTOLIN HFA) 108 (90 Base) MCG/ACT inhaler Inhale 2 puffs into the lungs every 4 (four) hours as needed for wheezing or shortness of breath. 1 Inhaler 5   amoxicillin (AMOXIL) 500 MG capsule Take 1 capsule (500 mg total) by mouth 3 (three) times daily. 21 capsule 0   cilostazol (PLETAL) 50 MG tablet Take 1 tablet (50 mg total) by mouth 2 (two) times daily. (Patient not taking: Reported on 12/01/2017) 180 tablet 0   clopidogrel (PLAVIX) 75 MG tablet Take 1 tablet (75 mg total) by mouth daily. (Patient not taking: Reported on 11/29/2017) 90 tablet 4   cyclobenzaprine (FLEXERIL) 10 MG tablet Take 1 tablet (10 mg total) by mouth daily as needed for muscle spasms. 30 tablet 0   DULoxetine (CYMBALTA) 30 MG capsule Take 1 capsule (30 mg total) by mouth daily. 30 capsule 0   enalapril (VASOTEC) 20 MG tablet Take 1 tablet (20 mg total) by mouth daily. 90 tablet 0   famotidine (PEPCID) 20 MG tablet Take 1 tablet (20 mg total) by mouth daily. 90 tablet 0   Fluticasone-Salmeterol (ADVAIR DISKUS) 250-50 MCG/DOSE AEPB Inhale 1 puff into the lungs 2 (two) times daily. 1 each 0   hydrochlorothiazide (MICROZIDE) 12.5 MG capsule Take 1 capsule (12.5 mg total) by mouth daily. 90 capsule 0   loratadine (CLARITIN) 10 MG tablet Take 1 tablet (10 mg total) by mouth daily. (Patient not taking: Reported on 12/01/2017) 90 tablet 2   metFORMIN (GLUCOPHAGE) 500 MG tablet TAKE 1 TABLET TWICE A DAY  WITH A MEAL 180 tablet 0   metoprolol tartrate  (LOPRESSOR) 25 MG tablet Take 1 tablet (25 mg total) by mouth 2 (two) times daily. (Patient taking differently: Take 12.5 mg by mouth 2 (two) times daily. ) 180 tablet 0   montelukast (SINGULAIR) 10 MG tablet Take 1 tablet (10 mg total) by mouth at bedtime. (Patient not taking: Reported on 12/01/2017) 90 tablet 0   nitroGLYCERIN (NITROSTAT) 0.4 MG SL tablet Place 1 tablet (0.4 mg total) under the  tongue every 5 (five) minutes as needed for chest pain. Only take up to 3 tablets. 6 tablet 0   ondansetron (ZOFRAN) 4 MG tablet Take 1 tablet (4 mg total) by mouth every 6 (six) hours as needed for nausea. 20 tablet 0   polyethylene glycol (MIRALAX / GLYCOLAX) packet Take 17 g by mouth 2 (two) times daily. (Patient taking differently: Take 17 g by mouth daily as needed. ) 14 each 0   predniSONE (DELTASONE) 20 MG tablet Take 2 tablets (40 mg total) by mouth daily with breakfast. For the next four days 8 tablet 0   simvastatin (ZOCOR) 20 MG tablet Take 1 tablet (20 mg total) by mouth at bedtime. 90 tablet 0   traMADol (ULTRAM) 50 MG tablet Take 1 tablet (50 mg total) by mouth daily as needed (For pain.). Reported on 01/01/2016 (Patient not taking: Reported on 12/01/2017) 30 tablet 0   No current facility-administered medications for this visit.     REVIEW OF SYSTEMS:    10 Point review of Systems was done is negative except as noted above.  PHYSICAL EXAMINATION: ECOG PERFORMANCE STATUS: 1 - Symptomatic but completely ambulatory  . Vitals:   12/13/17 1111  BP: 130/62  Pulse: 70  Resp: 14  Temp: 97.9 F (36.6 C)  SpO2: 100%   Filed Weights   12/13/17 1111  Weight: 129 lb 8 oz (58.7 kg)   .Body mass index is 22.94 kg/m.  GENERAL:alert, in no acute distress and comfortable SKIN: no acute rashes, no significant lesions EYES: conjunctiva are pink and non-injected, sclera anicteric OROPHARYNX: MMM, no exudates, no oropharyngeal erythema or ulceration NECK: supple, no JVD, subcentimeter  lymph nodes in posterior neck bilaterally   LYMPH:  no palpable lymphadenopathy in the cervical, axillary or inguinal regions LUNGS: clear to auscultation b/l with normal respiratory effort HEART: regular rate & rhythm ABDOMEN:  normoactive bowel sounds , non tender, not distended. Extremity: no pedal edema PSYCH: alert & oriented x 3 with fluent speech NEURO: no focal motor/sensory deficits   LABORATORY DATA:  I have reviewed the data as listed  . CBC Latest Ref Rng & Units 12/13/2017 11/28/2017 11/28/2017  WBC 3.9 - 10.3 10e3/uL 58.9(HH) 62.5(HH) -  Hemoglobin 11.6 - 15.9 g/dL 10.8(L) 11.0(L) 13.6  Hematocrit 34.8 - 46.6 % 34.5(L) 34.5(L) 40.0  Platelets 145 - 400 10e3/uL 362 201 -    . CMP Latest Ref Rng & Units 12/13/2017 11/29/2017 11/28/2017  Glucose 70 - 140 mg/dl 98 161(H) 241(H)  BUN 7.0 - 26.0 mg/dL 10.2 9 19   Creatinine 0.6 - 1.1 mg/dL 1.3(H) 1.03(H) 1.30(H)  Sodium 136 - 145 mEq/L 142 144 140  Potassium 3.5 - 5.1 mEq/L 4.5 4.5 3.8  Chloride 96 - 106 mmol/L - 102 102  CO2 22 - 29 mEq/L 30(H) 28 -  Calcium 8.4 - 10.4 mg/dL 8.9 9.1 -  Total Protein 6.4 - 8.3 g/dL 7.0 - -  Total Bilirubin 0.20 - 1.20 mg/dL 0.27 - -  Alkaline Phos 40 - 150 U/L 133 - -  AST 5 - 34 U/L 11 - -  ALT 0 - 55 U/L 8 - -       . Lab Results  Component Value Date   LDH 205 12/13/2017    RADIOGRAPHIC STUDIES: I have personally reviewed the radiological images as listed and agreed with the findings in the report. Dg Chest 2 View  Result Date: 11/27/2017 CLINICAL DATA:  Shortness of breath and coughing. EXAM: CHEST  2  VIEW COMPARISON:  Chest x-ray dated April 09, 2017. FINDINGS: The cardiomediastinal silhouette is borderline enlarged. Normal pulmonary vascularity. Atherosclerotic calcification of the aortic arch. No focal consolidation, pleural effusion, or pneumothorax. No acute osseous abnormality. IMPRESSION: No active cardiopulmonary disease. Electronically Signed   By: Titus Dubin  M.D.   On: 11/27/2017 13:01    ASSESSMENT & PLAN:   EADIE REPETTO is a wonderful 71 y.o. female with    1. Progressive lymphocytosis. No associated thrombocytopenia or anemia No constitutional symptoms. Plan  -Explained likely etiology of elevated and gradually increasing lymphocytes counts to pt and her husband --Explained this is likely CLL but will perform further testing to confirm. 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 -PBS - reviewed and show small mature lymphocytosis. CT chest/abd/pelvis to evaluate for SLL and hepato-splenomegaly. -No overt indication to treatment the patient's CLL at this time. -smoking cessation counseling done. -counseled on important symptoms to monitor for  -Explained plan to obtain blood tests, genetic mutation labs, and scans for lymph nodes or enlarged spleen in order to   #2 . Patient Active Problem List   Diagnosis Date Noted   Enteritis    Bradycardia 07/01/2016   Chronic kidney disease (CKD), stage III (moderate) ([place]) 06/04/2016   Shoulder pain, left 04/06/2016   Chest pain 02/06/2016   Chronic back pain 10/02/2015   Acute right hip pain 03/13/2012   Episodic tension type headache 09/24/2010   GAIT IMBALANCE 12/10/2009   DM (diabetes mellitus), type 2 with peripheral vascular complications ([place]) 40/09/2724   Chronic lymphocytic leukemia ([place]) 04/11/2009   Adjustment disorder with depressed mood 12/05/2008   PERIPHERAL VASCULAR DISEASE 10/13/2007   HYPERCHOLESTEROLEMIA 02/23/2007   ANXIETY 02/23/2007   TOBACCO DEPENDENCE 02/23/2007   HYPERTENSION, BENIGN SYSTEMIC 02/23/2007   COPD 02/23/2007   GASTROESOPHAGEAL REFLUX, NO ESOPHAGITIS 02/23/2007   Mx per PCP  PLAN Labs today CT chest/abd/pelvis in 2 weeks  RTC with Dr Irene Limbo in 3 weeks  All of the patients questions were answered with apparent satisfaction. The patient knows to call  the clinic with any problems, questions or concerns.  I spent 40 minutes counseling the patient face to face. The total time spent in the appointment was 50 minutes and more than 50% was on counseling and direct patient cares.    Sullivan Lone MD [place] AAHIVMS [facility] Hematology/Oncology Physician [facility]  (Office):       [phone] (Work cell):  [phone] (Fax):           [phone]  12/13/2017 11:09 AM   This document serves as a record of services personally performed by Sullivan Lone, MD. It was created on his behalf by Alean Rinne, a trained medical scribe. The creation of this record is based on the scribe's personal observations and the provider's statements to them.   .I have reviewed the above documentation for accuracy and completeness, and I agree with the above. Brunetta Genera MD MS

## 2017-12-14 ENCOUNTER — Other Ambulatory Visit: Payer: Self-pay | Admitting: Internal Medicine

## 2017-12-14 MED ORDER — HYDROCHLOROTHIAZIDE 12.5 MG PO CAPS: 12.5000 mg | ORAL_CAPSULE | Freq: Every day | ORAL | 3 refills | Status: DC

## 2017-12-14 MED ORDER — FAMOTIDINE 20 MG PO TABS: 20.0000 mg | ORAL_TABLET | Freq: Every day | ORAL | 3 refills | Status: DC

## 2017-12-14 MED ORDER — METFORMIN HCL 500 MG PO TABS: ORAL_TABLET | ORAL | 2 refills | Status: DC

## 2017-12-14 MED ORDER — DULOXETINE HCL 30 MG PO CPEP: 30.0000 mg | ORAL_CAPSULE | Freq: Every day | ORAL | 1 refills | Status: DC

## 2017-12-14 MED ORDER — ENALAPRIL MALEATE 20 MG PO TABS: 20.0000 mg | ORAL_TABLET | Freq: Every day | ORAL | 3 refills | Status: DC

## 2017-12-14 MED ORDER — CLOPIDOGREL BISULFATE 75 MG PO TABS: 75.0000 mg | ORAL_TABLET | Freq: Every day | ORAL | 4 refills | Status: DC

## 2017-12-14 MED ORDER — FLUTICASONE-SALMETEROL 250-50 MCG/DOSE IN AEPB: 1.0000 | INHALATION_SPRAY | Freq: Two times a day (BID) | RESPIRATORY_TRACT | 3 refills | Status: DC

## 2017-12-14 MED ORDER — TRAMADOL HCL 50 MG PO TABS: 50.0000 mg | ORAL_TABLET | Freq: Every day | ORAL | 0 refills | Status: DC | PRN

## 2017-12-14 MED ORDER — MONTELUKAST SODIUM 10 MG PO TABS: 10.0000 mg | ORAL_TABLET | Freq: Every day | ORAL | 3 refills | Status: DC

## 2017-12-14 MED ORDER — ALBUTEROL SULFATE HFA 108 (90 BASE) MCG/ACT IN AERS: 2.0000 | INHALATION_SPRAY | RESPIRATORY_TRACT | 5 refills | Status: DC | PRN

## 2017-12-14 MED ORDER — NITROGLYCERIN 0.4 MG SL SUBL: 0.4000 mg | SUBLINGUAL_TABLET | SUBLINGUAL | 0 refills | Status: DC | PRN

## 2017-12-14 MED ORDER — METOPROLOL TARTRATE 25 MG PO TABS: 12.5000 mg | ORAL_TABLET | Freq: Two times a day (BID) | ORAL | 2 refills | Status: DC

## 2017-12-14 MED ORDER — SIMVASTATIN 20 MG PO TABS: 20.0000 mg | ORAL_TABLET | Freq: Every day | ORAL | 3 refills | Status: DC

## 2017-12-14 MED ORDER — CYCLOBENZAPRINE HCL 10 MG PO TABS: 10.0000 mg | ORAL_TABLET | Freq: Every day | ORAL | 0 refills | Status: DC | PRN

## 2017-12-14 NOTE — Progress Notes (Signed)
Received fax for refills of her medications. Sent.

## 2017-12-16 LAB — FLOW CYTOMETRY

## 2017-12-26 LAB — FISH,CLL PROGNOSTIC PANEL

## 2017-12-30 ENCOUNTER — Ambulatory Visit (HOSPITAL_COMMUNITY)
Admission: RE | Admit: 2017-12-30 | Discharge: 2017-12-30 | Disposition: A | Discharge status: Home / Self Care | Payer: Medicare Other | Source: Ambulatory Visit | Attending: Hematology | Admitting: Hematology

## 2017-12-30 DIAGNOSIS — I700 Atherosclerosis of aorta: Secondary | ICD-10-CM | POA: Insufficient documentation

## 2017-12-30 DIAGNOSIS — C9190 Lymphoid leukemia, unspecified not having achieved remission: Secondary | ICD-10-CM | POA: Diagnosis not present

## 2017-12-30 DIAGNOSIS — C9111 Chronic lymphocytic leukemia of B-cell type in remission: Secondary | ICD-10-CM | POA: Diagnosis not present

## 2017-12-30 DIAGNOSIS — J439 Emphysema, unspecified: Secondary | ICD-10-CM | POA: Diagnosis not present

## 2017-12-30 DIAGNOSIS — D72820 Lymphocytosis (symptomatic): Secondary | ICD-10-CM | POA: Diagnosis not present

## 2017-12-30 DIAGNOSIS — C9110 Chronic lymphocytic leukemia of B-cell type not having achieved remission: Secondary | ICD-10-CM

## 2017-12-30 DIAGNOSIS — R918 Other nonspecific abnormal finding of lung field: Secondary | ICD-10-CM | POA: Insufficient documentation

## 2018-01-02 ENCOUNTER — Other Ambulatory Visit: Payer: Self-pay | Admitting: Internal Medicine

## 2018-01-02 MED ORDER — TRAMADOL HCL 50 MG PO TABS: 50.0000 mg | ORAL_TABLET | Freq: Every day | ORAL | 0 refills | Status: DC | PRN

## 2018-01-02 NOTE — Progress Notes (Signed)
   [facility] Phone: [phone]   Date of Visit: 01/04/2018   HPI:  Lower Extremity Muscle Cramps:  -This is a chronic issue.  Patient has had bilateral lower extremity muscle cramps for years.  Seems like they have been worsening.  Before she establish care at this clinic she was taking tramadol and baclofen as needed for her symptoms.  We switched her to Flexeril as baclofen did not help with her symptoms. -She reports that she drinks 3-412 ounce bottles of water a day -Cramping can happen during the day or at night and it can happen at rest or with activity -Patient has a past medical history significant for severe peripheral vascular disease with a history of graft placement.  Since she moved back to [facility] she has not followed up with vascular surgery.  At a recent visit we obtained ABIs which were abnormal. -No weakness of the lower extremities, no urinary incontinence or retention, no bowel incontinence. -She also has chronic low back pain with known bulging disks and shallow central disc protrusions at L4 and L5 and L5-S1 as well as mild bilateral lateral recess stenosis at L5-S1 which could potentially involve the S1 nerve roots. -She had MRI of her abdomen and pelvis done by hematology oncology for evaluation of CLL.  MRI noted a mass along the left sciatic nerve in the upper thigh which was partially imaged.  ROS: See HPI.  [place]:  PMH: PVD HTN DM2 CKD3 HLD COPD GERD Tension Type HA Tobacco Use Anxiety/Depression  PHYSICAL EXAM: BP 140/68   Pulse 74   Temp 98.3 F (36.8 C) (Oral)   Ht 5\' 3"  (1.6 m)   Wt 135 lb (61.2 kg)   SpO2 97%   BMI 23.91 kg/m  GEN: NAD HEENT: Atraumatic, normocephalic, neck supple, EOMI, sclera clear  CV: RRR, no murmurs, rubs, or gallops PULM: CTAB, normal effort MSK: Tenderness to palpation of the lumbosacral junction and left greater than right sacroiliac joint.  Tenderness to palpation of left lower  buttock region.  Normal strength of the lower extremities bilaterally.  Straight leg raise on the left leg produces pain that radiates to the left foot.  Feet are warm to touch and capillary refill is normal.  Unable to palpate peripheral pulses in the lower extremities. SKIN: No rash or cyanosis; warm and well-perfused EXTR: No lower extremity edema or calf tenderness PSYCH: Mood and affect euthymic, normal rate and volume of speech NEURO: Awake, alert, no focal deficits grossly, normal speech   ASSESSMENT/PLAN:  Lower Extremity Muscle Cramps:  Unclear etiology currently.  With her history of significant peripheral vascular disease she would benefit from reestablishing care with vascular surgery.  Referral made for vascular surgery.  With the incidental finding of the mass on the left sciatic nerve and with her symptoms which on exam are more significant on the left lower extremity, will order MRI for further evaluation of this mass.  Pulmonary nodules: Discussed incidental finding of pulmonary nodules on MRI chest that was obtained by her hematologist.  Will repeat in 3 months to assess stability.   Smiley Houseman, MD PGY [place]

## 2018-01-03 ENCOUNTER — Inpatient Hospital Stay: Payer: Medicare Other | Attending: Hematology | Admitting: Hematology

## 2018-01-03 ENCOUNTER — Encounter: Payer: Self-pay | Admitting: Hematology

## 2018-01-03 ENCOUNTER — Telehealth: Payer: Self-pay | Admitting: Hematology

## 2018-01-03 VITALS — BP 132/68 | HR 64 | Temp 98.5°F | Resp 18 | Ht 63.0 in | Wt 134.6 lb

## 2018-01-03 DIAGNOSIS — D649 Anemia, unspecified: Secondary | ICD-10-CM

## 2018-01-03 DIAGNOSIS — R911 Solitary pulmonary nodule: Secondary | ICD-10-CM | POA: Insufficient documentation

## 2018-01-03 DIAGNOSIS — Z720 Tobacco use: Secondary | ICD-10-CM | POA: Diagnosis not present

## 2018-01-03 DIAGNOSIS — C9110 Chronic lymphocytic leukemia of B-cell type not having achieved remission: Secondary | ICD-10-CM

## 2018-01-03 NOTE — Progress Notes (Signed)
HEMATOLOGY/ONCOLOGY CLINIC NOTE  Date of Service: 01/03/2018  Patient Care Team: Smiley Houseman, MD as PCP - General (Family Medicine) Dr. Nyoka Cowden (Dentistry) Ple[place], Eppie Gibson, RN as [place] Management  CHIEF COMPLAINTS/PURPOSE OF CONSULTATION:  lymphocytosis  HISTORY OF PRESENTING ILLNESS:   [name] [name] is a wonderful 72 y.o. female smoker with history of CLL who has been referred to [place] by Dr Dallas Schimke, Almond Lint, MD at [facility] for evaluation and management of elevated WBC/lymphocytes.   She is accompanied by her husband. She reported to ED on 11/27/2017 with cough and abdominal pain with COPD exacerbation. Her labs on this day showed WBC elevated at 62.5. She has not previously seen a hematologist. She reports a plan to quit smoking on the first day of the new year. She reports worsening decrease in balance and states she has a cane and walker that she uses as needed. She was taking Prednisone.  No weight loss/fevers/chills/night sweats/ palpable lumps or bumps.  On review of systems, pt reports back pain, change in balance, abdominal pain, weight loss and denies changes in BM, fever and any other accompanying symptoms.  INTERVAL HISTORY:  [name] [name] is here for a fu of her lymphocytosis.   Of note since her last visit, she has had a CT CAP on 12/30/2017 with results showing: IMPRESSION: 1. Upper normal size bilateral axillary lymph nodes. No overt pathologic adenopathy identified. No splenomegaly. 2. There is a mass along the left sciatic nerve in the left upper thigh, only partially included on today' s exam. This may well be a schwannoma or benign lesion. If the patient has in the left lower extremity neurologic impairment or if otherwise clinically warranted, MRI of the thigh without contrast could be utilized for further characterization. 3. Aortic Atherosclerosis (ICD10-I70.0). Ectatic ascending thoracic aorta. Vascular stents in the  common iliac arteries. Left femoral to distal bypass graft. 4. Wall thickening in the esophagus with contrast medium in the esophagus suggesting dysmotility or reflux. 5. Small scattered pulmonary nodules are present and includes some tree-in-bud branching nodularity especially in the right lung, and several fairly thin wall potentially cavitary nodules in the left upper lobe. Although these may well be inflammatory and/or due to bronchiectasis consider follow up CT chest in 3 months time to assess for stability. 6.  Emphysema (ICD10-J43.9). She presents to the office today accompanied by her husband.   She reports noticing nose bleeds after taking Plavix and has discontinued this. Her labs showed low hgb of 10.8 on 12/13/17 and she reports that she received 2u blood transfusions during her time at the ED.   On review of systems, pt reports dizziness, hot flashes, numbness and swelling to the right foot, and denies fever, chills and any other accompanying symptoms  MEDICAL HISTORY:  Past Medical History:  Diagnosis Date  . Asthma   . CLL (chronic lymphoblastic leukemia)   . Constipation 11/15/2011  . COPD (chronic obstructive pulmonary disease) ([place])   . Diabetes mellitus without complication ([place])   . GERD (gastroesophageal reflux disease)   . Hyperlipidemia   . Hypertension   . PVD (peripheral vascular disease) ([place])   . Stroke [facility])    in 1998 due to tumor     SURGICAL HISTORY: Past Surgical History:  Procedure Laterality Date  . ABI  02/2012   ABI <0.65 BL 02/2012  . BRAIN MENINGIOMA EXCISION    . Bypass grafting of RLE for PAD claudication     .  CESAREAN SECTION     1974, 78 ,80  . CHOLECYSTECTOMY, LAPAROSCOPIC    . ESOPHAGEAL DILATION      SOCIAL HISTORY: Social History   Socioeconomic History  . Marital status: Married    Spouse name: Not on file  . Number of children: 3  . Years of education: Not on file  . Highest education level: Not on file  Social Needs  .  Financial resource strain: Not on file  . Food insecurity - worry: Not on file  . Food insecurity - inability: Not on file  . Transportation needs - medical: Not on file  . Transportation needs - non-medical: Not on file  Occupational History  . Occupation: DISABLED    Employer: DISABLED  Tobacco Use  . Smoking status: Current Some Day Smoker    Packs/day: 0.10    Years: 30.00    Pack years: 3.00    Types: Cigarettes    Start date: 01/01/1976  . Smokeless tobacco: Never Used  . Tobacco comment: pt states that she does not smoke everyday   Substance and Sexual Activity  . Alcohol use: No    Alcohol/week: 0.0 oz  . Drug use: No  . Sexual activity: Yes  Other Topics Concern  . Not on file  Social History Narrative   Health Care POA:    Emergency Contact: [name] [name] [phone] (c)   End of Life Plan:    Who lives with you: Lives with husband   Any pets: none   Diet: Patient lacks financial resources for much food. Pt reports eating what is available.   Exercise: Patient does not have an exercise plan.   Seatbelts: Patient reports wearing seatbelt when in vehicle.    [name] Exposure/Protection:   Hobbies: Bowling, computer games, Bingo      Has financial difficulties and transportation issues as she and her husband share transportation        FAMILY HISTORY: Family History  Problem Relation Age of Onset  . Heart disease Mother   . Asthma Mother   . Cancer Mother        uterine   . Depression Mother   . Heart attack Mother 63  . Hyperlipidemia Mother   . Hypertension Mother   . Stroke Mother   . Kidney disease Mother   . Heart attack Sister   . Stroke Sister   . Depression Sister   . Diabetes Sister   . Hyperlipidemia Sister   . Depression Sister   . Diabetes Sister   . Hyperlipidemia Sister   . HIV/AIDS Sister   . Diabetes Brother   . Asthma Brother   . Hypertension Brother   . Cancer Maternal Aunt        lung  . Heart disease Maternal Grandmother   . Heart  attack Maternal Grandmother   . Cancer Brother        colon  . HIV/AIDS Brother     ALLERGIES:  is allergic to aspirin and baclofen.  MEDICATIONS:  Current Outpatient Medications  Medication Sig Dispense Refill  . albuterol (PROVENTIL HFA;VENTOLIN HFA) 108 (90 Base) MCG/ACT inhaler Inhale 2 puffs into the lungs every 4 (four) hours as needed for wheezing or shortness of breath. 1 Inhaler 5  . amoxicillin (AMOXIL) 500 MG capsule Take 1 capsule (500 mg total) by mouth 3 (three) times daily. 21 capsule 0  . cilostazol (PLETAL) 50 MG tablet Take 1 tablet (50 mg total) by mouth 2 (two) times daily. 180 tablet  0  . clopidogrel (PLAVIX) 75 MG tablet Take 1 tablet (75 mg total) by mouth daily. 90 tablet 4  . cyclobenzaprine (FLEXERIL) 10 MG tablet Take 1 tablet (10 mg total) by mouth daily as needed for muscle spasms. 30 tablet 0  . DULoxetine (CYMBALTA) 30 MG capsule Take 1 capsule (30 mg total) by mouth daily. 90 capsule 1  . enalapril (VASOTEC) 20 MG tablet Take 1 tablet (20 mg total) by mouth daily. 90 tablet 3  . famotidine (PEPCID) 20 MG tablet Take 1 tablet (20 mg total) by mouth daily. 90 tablet 3  . Fluticasone-Salmeterol (ADVAIR DISKUS) 250-50 MCG/DOSE AEPB Inhale 1 puff into the lungs 2 (two) times daily. 1 each 3  . hydrochlorothiazide (MICROZIDE) 12.5 MG capsule Take 1 capsule (12.5 mg total) by mouth daily. 90 capsule 3  . loratadine (CLARITIN) 10 MG tablet Take 1 tablet (10 mg total) by mouth daily. 90 tablet 2  . metFORMIN (GLUCOPHAGE) 500 MG tablet TAKE 1 TABLET TWICE A DAY  WITH A MEAL 180 tablet 2  . metoprolol tartrate (LOPRESSOR) 25 MG tablet Take 0.5 tablets (12.5 mg total) by mouth 2 (two) times daily. 90 tablet 2  . montelukast (SINGULAIR) 10 MG tablet Take 1 tablet (10 mg total) by mouth at bedtime. 90 tablet 3  . nitroGLYCERIN (NITROSTAT) 0.4 MG SL tablet Place 1 tablet (0.4 mg total) under the tongue every 5 (five) minutes as needed for chest pain. Only take up to 3  tablets. 6 tablet 0  . ondansetron (ZOFRAN) 4 MG tablet Take 1 tablet (4 mg total) by mouth every 6 (six) hours as needed for nausea. 20 tablet 0  . polyethylene glycol (MIRALAX / GLYCOLAX) packet Take 17 g by mouth 2 (two) times daily. (Patient taking differently: Take 17 g by mouth daily as needed. ) 14 each 0  . predniSONE (DELTASONE) 20 MG tablet Take 2 tablets (40 mg total) by mouth daily with breakfast. For the next four days 8 tablet 0  . simvastatin (ZOCOR) 20 MG tablet Take 1 tablet (20 mg total) by mouth at bedtime. 90 tablet 3  . traMADol (ULTRAM) 50 MG tablet Take 1 tablet (50 mg total) by mouth daily as needed (For pain.). Reported on 01/01/2016 30 tablet 0   No current facility-administered medications for this visit.     REVIEW OF SYSTEMS:    10 Point review of Systems was done is negative except as noted above.  PHYSICAL EXAMINATION: ECOG PERFORMANCE STATUS: 1 - Symptomatic but completely ambulatory  . Vitals:   01/03/18 1004  BP: 132/68  Pulse: 64  Resp: 18  Temp: 98.5 F (36.9 C)  SpO2: 99%   Filed Weights   01/03/18 1004  Weight: 134 lb 9.6 oz (61.1 kg)   .Body mass index is 23.84 kg/m.  GENERAL:alert, in no acute distress and comfortable SKIN: no acute rashes, no significant lesions EYES: conjunctiva are pink and non-injected, sclera anicteric OROPHARYNX: MMM, no exudates, no oropharyngeal erythema or ulceration NECK: supple, no JVD, subcentimeter lymph nodes in posterior neck bilaterally   LYMPH:  no palpable lymphadenopathy in the cervical, axillary or inguinal regions LUNGS: clear to auscultation b/l with normal respiratory effort HEART: regular rate & rhythm ABDOMEN:  normoactive bowel sounds , non tender, not distended. Extremity: no pedal edema PSYCH: alert & oriented x 3 with fluent speech NEURO: no focal motor/sensory deficits   LABORATORY DATA:  I have reviewed the data as listed  . CBC Latest Ref Rng &  Units 12/13/2017 11/28/2017  11/28/2017  WBC 3.9 - 10.3 10e3/uL 58.9(HH) 62.5(HH) -  Hemoglobin 11.6 - 15.9 g/dL 10.8(L) 11.0(L) 13.6  Hematocrit 34.8 - 46.6 % 34.5(L) 34.5(L) 40.0  Platelets 145 - 400 10e3/uL 362 201 -    . CMP Latest Ref Rng & Units 12/13/2017 11/29/2017 11/28/2017  Glucose 70 - 140 mg/dl 98 161(H) 241(H)  BUN 7.0 - 26.0 mg/dL 10.2 9 19   Creatinine 0.6 - 1.1 mg/dL 1.3(H) 1.03(H) 1.30(H)  Sodium 136 - 145 mEq/L 142 144 140  Potassium 3.5 - 5.1 mEq/L 4.5 4.5 3.8  Chloride 96 - 106 mmol/L - 102 102  CO2 22 - 29 mEq/L 30(H) 28 -  Calcium 8.4 - 10.4 mg/dL 8.9 9.1 -  Total Protein 6.4 - 8.3 g/dL 7.0 - -  Total Bilirubin 0.20 - 1.20 mg/dL 0.27 - -  Alkaline Phos 40 - 150 U/L 133 - -  AST 5 - 34 U/L 11 - -  ALT 0 - 55 U/L 8 - -       . Lab Results  Component Value Date   LDH 205 12/13/2017    RADIOGRAPHIC STUDIES: I have personally reviewed the radiological images as listed and agreed with the findings in the report. Ct Abdomen Pelvis Wo Contrast  Result Date: 12/30/2017 CLINICAL DATA:  Chronic lymphocytic leukemia.  Lymphocytosis. EXAM: CT CHEST, ABDOMEN AND PELVIS WITHOUT CONTRAST TECHNIQUE: Multidetector CT imaging of the chest, abdomen and pelvis was performed following the standard protocol without IV contrast. COMPARISON:  04/09/2017 FINDINGS: CT CHEST FINDINGS Cardiovascular: Atherosclerotic calcification of the aortic arch and branch vessels. Ectatic ascending thoracic aorta at 3.8 cm. Mediastinum/Nodes: Right axillary lymph node 1.2 cm in short axis, including the fatty hilum, image 13/2. A left axillary node measures 1.1 cm in short axis on image 15/2. Wall thickening of portions of the esophagus with contrast medium in the esophagus suggesting dysmotility or reflux. Lungs/Pleura: Centrilobular emphysema. Scattered small nodules in both lungs are present, some isolated and some with a tree-in-bud morphology and clustered appearance. Index right lower lobe subpleural nodule 0.5 by 0.4 cm on  image 77/4. A right upper lobe nodule measuring 0.5 by 0.4 cm is present on image 52/4 adjacent to several other small ill-defined nodules. In the left upper lobe there is some tree-in-bud nodularity along with a potentially centrally cavitary nodule with wall thickness of about 1.5 mm an overall size of 1.2 by 0.8 cm, image 41/4. This could be from focal bronchiectasis and a similar appearance is present on image 34/4. Musculoskeletal: Unremarkable CT ABDOMEN PELVIS FINDINGS Hepatobiliary: Cholecystectomy.  Otherwise unremarkable. Pancreas: Unremarkable Spleen: The spleen measures 7.9 by 3.7 by 7.4 cm (volume = 110 cm^3). Adrenals/Urinary Tract: Unremarkable Stomach/Bowel: Scattered sigmoid colon diverticula proximally. Vascular/Lymphatic: Aortoiliac atherosclerotic vascular disease. Vascular stents in the common iliac arteries. Coils in the left internal iliac system. Left femoral to distal bypass graft. No pathologic adenopathy identified. Reproductive: Accentuated density in the right posterior myometrium, fibroid not excluded. Other: No supplemental non-categorized findings. Musculoskeletal: Borderline bilateral foraminal stenosis at L5-S1 due to degenerative disc disease. IMPRESSION: 1. Upper normal size bilateral axillary lymph nodes. No overt pathologic adenopathy identified. No splenomegaly. 2. There is a mass along the left sciatic nerve in the left upper thigh, only partially included on today' s exam. This may well be a schwannoma or benign lesion. If the patient has in the left lower extremity neurologic impairment or if otherwise clinically warranted, MRI of the thigh without contrast could be utilized  for further characterization. 3. Aortic Atherosclerosis (ICD10-I70.0). Ectatic ascending thoracic aorta. Vascular stents in the common iliac arteries. Left femoral to distal bypass graft. 4. Wall thickening in the esophagus with contrast medium in the esophagus suggesting dysmotility or reflux. 5. Small  scattered pulmonary nodules are present and includes some tree-in-bud branching nodularity especially in the right lung, and several fairly thin wall potentially cavitary nodules in the left upper lobe. Although these may well be inflammatory and/or due to bronchiectasis consider follow up CT chest in 3 months time to assess for stability. 6.  Emphysema (ICD10-J43.9). Electronically Signed   By: Van Clines M.D.   On: 12/30/2017 10:17   Ct Chest Wo Contrast  Result Date: 12/30/2017 CLINICAL DATA:  Chronic lymphocytic leukemia.  Lymphocytosis. EXAM: CT CHEST, ABDOMEN AND PELVIS WITHOUT CONTRAST TECHNIQUE: Multidetector CT imaging of the chest, abdomen and pelvis was performed following the standard protocol without IV contrast. COMPARISON:  04/09/2017 FINDINGS: CT CHEST FINDINGS Cardiovascular: Atherosclerotic calcification of the aortic arch and branch vessels. Ectatic ascending thoracic aorta at 3.8 cm. Mediastinum/Nodes: Right axillary lymph node 1.2 cm in short axis, including the fatty hilum, image 13/2. A left axillary node measures 1.1 cm in short axis on image 15/2. Wall thickening of portions of the esophagus with contrast medium in the esophagus suggesting dysmotility or reflux. Lungs/Pleura: Centrilobular emphysema. Scattered small nodules in both lungs are present, some isolated and some with a tree-in-bud morphology and clustered appearance. Index right lower lobe subpleural nodule 0.5 by 0.4 cm on image 77/4. A right upper lobe nodule measuring 0.5 by 0.4 cm is present on image 52/4 adjacent to several other small ill-defined nodules. In the left upper lobe there is some tree-in-bud nodularity along with a potentially centrally cavitary nodule with wall thickness of about 1.5 mm an overall size of 1.2 by 0.8 cm, image 41/4. This could be from focal bronchiectasis and a similar appearance is present on image 34/4. Musculoskeletal: Unremarkable CT ABDOMEN PELVIS FINDINGS Hepatobiliary:  Cholecystectomy.  Otherwise unremarkable. Pancreas: Unremarkable Spleen: The spleen measures 7.9 by 3.7 by 7.4 cm (volume = 110 cm^3). Adrenals/Urinary Tract: Unremarkable Stomach/Bowel: Scattered sigmoid colon diverticula proximally. Vascular/Lymphatic: Aortoiliac atherosclerotic vascular disease. Vascular stents in the common iliac arteries. Coils in the left internal iliac system. Left femoral to distal bypass graft. No pathologic adenopathy identified. Reproductive: Accentuated density in the right posterior myometrium, fibroid not excluded. Other: No supplemental non-categorized findings. Musculoskeletal: Borderline bilateral foraminal stenosis at L5-S1 due to degenerative disc disease. IMPRESSION: 1. Upper normal size bilateral axillary lymph nodes. No overt pathologic adenopathy identified. No splenomegaly. 2. There is a mass along the left sciatic nerve in the left upper thigh, only partially included on today' s exam. This may well be a schwannoma or benign lesion. If the patient has in the left lower extremity neurologic impairment or if otherwise clinically warranted, MRI of the thigh without contrast could be utilized for further characterization. 3. Aortic Atherosclerosis (ICD10-I70.0). Ectatic ascending thoracic aorta. Vascular stents in the common iliac arteries. Left femoral to distal bypass graft. 4. Wall thickening in the esophagus with contrast medium in the esophagus suggesting dysmotility or reflux. 5. Small scattered pulmonary nodules are present and includes some tree-in-bud branching nodularity especially in the right lung, and several fairly thin wall potentially cavitary nodules in the left upper lobe. Although these may well be inflammatory and/or due to bronchiectasis consider follow up CT chest in 3 months time to assess for stability. 6.  Emphysema (ICD10-J43.9). Electronically Signed  By: Van Clines M.D.   On: 12/30/2017 10:17   Surgical Pathology 12/13/2017   ASSESSMENT  & PLAN:   MAKITA BLOW is a wonderful 72 y.o. female with    1. Rai 1 Chronic Lymphocytic Leukemia (Newly diagnosed) - Trisomy 12 mutation. No associated thrombocytopenia.  Mild anemia - likely from acute blood loss from significant epistaxis No constitutional symptoms. Minimal LNaednopathy No splenomegaly Plan  -we discussed in details with the patient and her husband her new diagnosis of CLL, naturnal history, prognosis and treatment rational. -noted to Trisomy 12 mutation with CLL. -CT chest/abd/pelvis - showed borderline LNadenoapthy , no splenomegaly. -No overt indication to treatment the patient's CLL at this time. -smoking cessation counseling done. -counseled on important symptoms to monitor for  --Explained criteria for treatment and the symptoms we monitor  -We will monitor blood tests at this time -Will determine if anemia is result of nose bleeds or CLL with labs on next visit - will need to monitor -Informed pt of incidental findings of thickening of nerve on scan and discussed if any accompanying symptoms, would have to obtain MRI -Recommend repeat CT scan for incidental finding of lung nodules on scan. Will need rpt CT chest in 3-4 months -Recommend complete cessation of smoking and counseled pt on importance of this with regards to her health and lungs  #2 .Lung nodules on CT. -given smoking hx will need to monitor -rpt CT chest in 3-4 months  #3 Patient Active Problem List   Diagnosis Date Noted  . Enteritis   . Bradycardia 07/01/2016  . Chronic kidney disease (CKD), stage III (moderate) ([place]) 06/04/2016  . Shoulder pain, left 04/06/2016  . Chest pain 02/06/2016  . Chronic back pain 10/02/2015  . Acute right hip pain 03/13/2012  . Episodic tension type headache 09/24/2010  . GAIT IMBALANCE 12/10/2009  . DM (diabetes mellitus), type 2 with peripheral vascular complications ([place]) 83/66/2947  . Chronic lymphocytic leukemia ([place]) 04/11/2009  . Adjustment  disorder with depressed mood 12/05/2008  . PERIPHERAL VASCULAR DISEASE 10/13/2007  . HYPERCHOLESTEROLEMIA 02/23/2007  . ANXIETY 02/23/2007  . TOBACCO DEPENDENCE 02/23/2007  . HYPERTENSION, BENIGN SYSTEMIC 02/23/2007  . COPD 02/23/2007  . GASTROESOPHAGEAL REFLUX, NO ESOPHAGITIS 02/23/2007   Mx per PCP  PLAN  RTC with Dr Irene Limbo in 6 weeks with labs  All of the patients questions were answered with apparent satisfaction. The patient knows to call the clinic with any problems, questions or concerns.  I spent 20 minutes counseling the patient face to face. The total time spent in the appointment was 25 minutes and more than 50% was on counseling and direct patient cares.    Sullivan Lone MD [place] AAHIVMS [facility] Hematology/Oncology Physician [facility]  (Office):       [phone] (Work cell):  [phone] (Fax):           [phone]  01/03/2018 10:20 AM   This document serves as a record of services personally performed by Sullivan Lone, MD. It was created on his behalf by Alean Rinne, a trained medical scribe. The creation of this record is based on the scribe's personal observations and the provider's statements to them.   .I have reviewed the above documentation for accuracy and completeness, and I agree with the above. Brunetta Genera MD MS

## 2018-01-03 NOTE — Patient Instructions (Signed)
Thank you for choosing [facility] to provide your oncology and hematology care.  To afford each patient quality time with our providers, please arrive 30 minutes before your scheduled appointment time.  If you arrive late for your appointment, you may be asked to reschedule.  We strive to give you quality time with our providers, and arriving late affects you and other patients whose appointments are after yours.   If you are a no show for multiple scheduled visits, you may be dismissed from the clinic at the providers discretion.    Again, thank you for choosing [facility], our hope is that these requests will decrease the amount of time that you wait before being seen by our physicians.  ______________________________________________________________________  Should you have questions after your visit to the [facility], please contact our office at [phone] between the hours of 8:30 and 4:30 p.m.    Voicemails left after 4:30p.m will not be returned until the following business day.    For prescription refill requests, please have your pharmacy contact us directly.  Please also try to allow 48 hours for prescription requests.    Please contact the scheduling department for questions regarding scheduling.  For scheduling of procedures such as PET scans, CT scans, MRI, Ultrasound, etc please contact central scheduling at [phone].    Resources For Cancer Patients and Caregivers:   Oncolink.org:  A wonderful resource for patients and healthcare providers for information regarding your disease, ways to tract your treatment, what to expect, etc.     American Cancer Society:  800-227-2345  Can help patients locate various types of support and financial assistance  Cancer Care: 1-800-813-HOPE (4673) Provides financial assistance, online support groups, medication/co-pay assistance.    [place] DSS:  336-641-3447 Where to apply for food  stamps, Medicaid, and utility assistance  Medicare Rights Center: 800-333-4114 Helps people with Medicare understand their rights and benefits, navigate the Medicare system, and secure the quality healthcare they deserve  SCAT: 336-333-6589  [employer]'s shared-ride transportation service for eligible riders who have a disability that prevents them from riding the fixed route bus.    For additional information on assistance programs please contact our social worker:   Grier Hock/Abigail Elmore:  [phone]            

## 2018-01-03 NOTE — Telephone Encounter (Signed)
Scheduled appt per 1/8 los - Gave patient AVS and calender per los. Lab and f/u in 6 weeks.

## 2018-01-04 ENCOUNTER — Ambulatory Visit (INDEPENDENT_AMBULATORY_CARE_PROVIDER_SITE_OTHER): Payer: Medicare Other | Admitting: Internal Medicine

## 2018-01-04 ENCOUNTER — Other Ambulatory Visit: Payer: Self-pay

## 2018-01-04 ENCOUNTER — Encounter: Payer: Self-pay | Admitting: Internal Medicine

## 2018-01-04 VITALS — BP 140/68 | HR 74 | Temp 98.3°F | Ht 63.0 in | Wt 135.0 lb

## 2018-01-04 DIAGNOSIS — R2242 Localized swelling, mass and lump, left lower limb: Secondary | ICD-10-CM

## 2018-01-04 DIAGNOSIS — I739 Peripheral vascular disease, unspecified: Secondary | ICD-10-CM | POA: Diagnosis not present

## 2018-01-04 DIAGNOSIS — R918 Other nonspecific abnormal finding of lung field: Secondary | ICD-10-CM | POA: Diagnosis not present

## 2018-01-04 NOTE — Patient Instructions (Addendum)
Please make an appointment with the ENT doctor.  I made a referreral to the vascular doctors  I also ordered an MRI to further evaluate the mass on the nerve of your leg.  Please follow up in about 2-3 weeks

## 2018-01-10 ENCOUNTER — Ambulatory Visit (HOSPITAL_COMMUNITY)
Admission: RE | Admit: 2018-01-10 | Discharge: 2018-01-10 | Disposition: A | Discharge status: Home / Self Care | Payer: Medicare Other | Source: Ambulatory Visit | Attending: Family Medicine | Admitting: Family Medicine

## 2018-01-10 DIAGNOSIS — M7989 Other specified soft tissue disorders: Secondary | ICD-10-CM | POA: Diagnosis not present

## 2018-01-10 DIAGNOSIS — D1724 Benign lipomatous neoplasm of skin and subcutaneous tissue of left leg: Secondary | ICD-10-CM | POA: Insufficient documentation

## 2018-01-10 DIAGNOSIS — R2242 Localized swelling, mass and lump, left lower limb: Secondary | ICD-10-CM | POA: Insufficient documentation

## 2018-01-16 NOTE — Progress Notes (Signed)
   [facility] Phone: [phone]   Date of Visit: 01/18/2018   HPI:  For discussion of MRI findings: -Discussed the findings of mass on the sciatic nerve which was thought to be likely consistent with neurofibroma.  We discussed that most neurofibromas are benign but according to my reading plexiform neurofibromas do have a potential to become malignant.  I discussed this with an attending Dr. Mingo Amber earlier in the week about the next steps.  We think that patient should discuss this finding with her oncologist and I will also send the oncologist a message regarding this.  Even if the mass is benign, we would still recommend referral to neurosurgery for discussion of management of possible symptomatic neurofibroma.  Health Maintenance:  -We discussed multiple times about the need for various health maintenance evaluations -We have faxed over a release of information to her primary care provider but have not heard from them.  Patient has reached out to clinic and was told that I have to speak to her former doctor over the phone.  The clinic is [facility] [place] [facility] with phone [phone].   ROS: See HPI.  [place]:  PMH: PVD HTN DM2 CKD3 HLD COPD GERD Tension Type HA Tobacco Use Anxiety/Depression  PHYSICAL EXAM: BP (!) 141/59   Pulse 61   Temp (!) 97.4 F (36.3 C) (Oral)   Wt 130 lb (59 kg)   SpO2 99%   BMI 23.03 kg/m  GEN: NAD CV: RRR, no murmurs, rubs, or gallops PULM: CTAB, normal effort NEURO: Awake, alert, no focal deficits grossly other than antalgic gait due to chronic back pain, normal speech;   ASSESSMENT/PLAN:  Health maintenance:  - I will attempt to call her PCP to get more information.   Neurofibroma Patient to discuss this with oncology. I will also send message via Epic to her oncologist.  - Ambulatory referral to Neurosurgery  Follow up in 3-4 months   Smiley Houseman, MD PGY [place]

## 2018-01-18 ENCOUNTER — Ambulatory Visit (INDEPENDENT_AMBULATORY_CARE_PROVIDER_SITE_OTHER): Payer: Medicare Other | Admitting: Internal Medicine

## 2018-01-18 ENCOUNTER — Other Ambulatory Visit: Payer: Self-pay

## 2018-01-18 ENCOUNTER — Encounter: Payer: Self-pay | Admitting: Internal Medicine

## 2018-01-18 VITALS — BP 141/59 | HR 61 | Temp 97.4°F | Wt 130.0 lb

## 2018-01-18 DIAGNOSIS — D3610 Benign neoplasm of peripheral nerves and autonomic nervous system, unspecified: Secondary | ICD-10-CM

## 2018-01-18 NOTE — Patient Instructions (Addendum)
Follow up 3-4 months for diabetes  I made a referral to the neurosurgeon to discuss the mass on your nerve.   I will call your old doctor and send a message to your oncology doctor

## 2018-01-30 ENCOUNTER — Telehealth: Payer: Self-pay | Admitting: Hematology

## 2018-01-30 NOTE — Telephone Encounter (Signed)
Called regarding 2/20

## 2018-02-13 NOTE — Progress Notes (Signed)
HEMATOLOGY/ONCOLOGY CLINIC NOTE  Date of Service: 02/15/2018  Patient Care Team: Smiley Houseman, MD as PCP - General (Family Medicine) Dr. Nyoka Cowden (Dentistry) [place], Eppie Gibson, RN as [place] Management  CHIEF COMPLAINTS/PURPOSE OF CONSULTATION:  F/u for CLL  HISTORY OF PRESENTING ILLNESS:   [name] [name] is a wonderful 72 y.o. female smoker with history of CLL who has been referred to [place] by Dr Dallas Schimke, Almond Lint, MD at [facility] for evaluation and management of elevated WBC/lymphocytes.   She is accompanied by her husband. She reported to ED on 11/27/2017 with cough and abdominal pain with COPD exacerbation. Her labs on this day showed WBC elevated at 62.5. She has not previously seen a hematologist. She reports a plan to quit smoking on the first day of the new year. She reports worsening decrease in balance and states she has a cane and walker that she uses as needed. She was taking Prednisone.  No weight loss/fevers/chills/night sweats/ palpable lumps or bumps.  On review of systems, pt reports back pain, change in balance, abdominal pain, weight loss and denies changes in BM, fever and any other accompanying symptoms.  INTERVAL HISTORY:  Ms. Hatcher is here for a fu of her Chronic Leukocytic Leukemia. The patient's last visit with [place] was on 01/03/18. She is accompanied today by her husband. The pt reports that she is doing well overall. She notes that she has been getting slightly worse night sweats. She had some difficulty figuring out when or if her night sweats have worsened.  She noted that in January she had a significant nose bleed for which she had to call paramedics. She notes that she has "staggered" some when she tries to get up.  Of note since the patient last visit, pt has had a Femur MRI completed on 01/10/18 with results revealing 1. 2.4 x 2.7 x 3.6 cm mass along the course of left sciatic nerve just below the level of hamstring  origin most consistent with a Neurofibroma. 2. Simple lipoma of left vastus lateralis muscle.  Lab results today (02/15/18) of CBC, CMP, and Reticulocytes is as follows: all values are WNL except for WBC at 56.8k, Hgb at 11.4, MCHC at 30.5, RDW at 17.1, Lymphs Abs at 50k, Basophils abs at 0.4k, Creatinine at 1.30.  LDH borderline high at 254 on 02/15/18.  On review of systems, pt reports night sweats, and denies fevers, chills, numbness in her hands and feet, lumps or bumps, abdominal pains, and any other symptoms.    MEDICAL HISTORY:  Past Medical History:  Diagnosis Date  . Asthma   . CLL (chronic lymphoblastic leukemia)   . Constipation 11/15/2011  . COPD (chronic obstructive pulmonary disease) ([place])   . Diabetes mellitus without complication ([place])   . GERD (gastroesophageal reflux disease)   . Hyperlipidemia   . Hypertension   . PVD (peripheral vascular disease) ([place])   . Stroke [facility])    in 1998 due to tumor     SURGICAL HISTORY: Past Surgical History:  Procedure Laterality Date  . ABI  02/2012   ABI <0.65 BL 02/2012  . BRAIN MENINGIOMA EXCISION    . Bypass grafting of RLE for PAD claudication     . CESAREAN SECTION     1974, 78 ,80  . CHOLECYSTECTOMY, LAPAROSCOPIC    . ESOPHAGEAL DILATION      SOCIAL HISTORY: Social History   Socioeconomic History  . Marital status: Married    Spouse  name: Not on file  . Number of children: 3  . Years of education: Not on file  . Highest education level: Not on file  Social Needs  . Financial resource strain: Not on file  . Food insecurity - worry: Not on file  . Food insecurity - inability: Not on file  . Transportation needs - medical: Not on file  . Transportation needs - non-medical: Not on file  Occupational History  . Occupation: DISABLED    Employer: DISABLED  Tobacco Use  . Smoking status: Current Some Day Smoker    Packs/day: 0.10    Years: 30.00    Pack years: 3.00    Types: Cigarettes    Start date: 01/01/1976    . Smokeless tobacco: Never Used  . Tobacco comment: pt states that she does not smoke everyday   Substance and Sexual Activity  . Alcohol use: No    Alcohol/week: 0.0 oz  . Drug use: No  . Sexual activity: Yes  Other Topics Concern  . Not on file  Social History Narrative   Health Care POA:    Emergency Contact: Kyriaki Moder [phone] (c)   End of Life Plan:    Who lives with you: Lives with husband   Any pets: none   Diet: Patient lacks financial resources for much food. Pt reports eating what is available.   Exercise: Patient does not have an exercise plan.   Seatbelts: Patient reports wearing seatbelt when in vehicle.    [name] Exposure/Protection:   Hobbies: Bowling, computer games, Bingo      Has financial difficulties and transportation issues as she and her husband share transportation        FAMILY HISTORY: Family History  Problem Relation Age of Onset  . Heart disease Mother   . Asthma Mother   . Cancer Mother        uterine   . Depression Mother   . Heart attack Mother 35  . Hyperlipidemia Mother   . Hypertension Mother   . Stroke Mother   . Kidney disease Mother   . Heart attack Sister   . Stroke Sister   . Depression Sister   . Diabetes Sister   . Hyperlipidemia Sister   . Depression Sister   . Diabetes Sister   . Hyperlipidemia Sister   . HIV/AIDS Sister   . Diabetes Brother   . Asthma Brother   . Hypertension Brother   . Cancer Maternal Aunt        lung  . Heart disease Maternal Grandmother   . Heart attack Maternal Grandmother   . Cancer Brother        colon  . HIV/AIDS Brother     ALLERGIES:  is allergic to aspirin and baclofen.  MEDICATIONS:  Current Outpatient Medications  Medication Sig Dispense Refill  . albuterol (PROVENTIL HFA;VENTOLIN HFA) 108 (90 Base) MCG/ACT inhaler Inhale 2 puffs into the lungs every 4 (four) hours as needed for wheezing or shortness of breath. 1 Inhaler 5  . cilostazol (PLETAL) 50 MG tablet Take 1 tablet (50  mg total) by mouth 2 (two) times daily. 180 tablet 0  . clopidogrel (PLAVIX) 75 MG tablet Take 1 tablet (75 mg total) by mouth daily. 90 tablet 4  . cyclobenzaprine (FLEXERIL) 10 MG tablet Take 1 tablet (10 mg total) by mouth daily as needed for muscle spasms. 30 tablet 0  . DULoxetine (CYMBALTA) 30 MG capsule Take 1 capsule (30 mg total) by mouth daily. 90 capsule  1  . enalapril (VASOTEC) 20 MG tablet Take 1 tablet (20 mg total) by mouth daily. 90 tablet 3  . famotidine (PEPCID) 20 MG tablet Take 1 tablet (20 mg total) by mouth daily. 90 tablet 3  . Fluticasone-Salmeterol (ADVAIR DISKUS) 250-50 MCG/DOSE AEPB Inhale 1 puff into the lungs 2 (two) times daily. 1 each 3  . hydrochlorothiazide (MICROZIDE) 12.5 MG capsule Take 1 capsule (12.5 mg total) by mouth daily. 90 capsule 3  . loratadine (CLARITIN) 10 MG tablet Take 1 tablet (10 mg total) by mouth daily. 90 tablet 2  . metFORMIN (GLUCOPHAGE) 500 MG tablet TAKE 1 TABLET TWICE A DAY  WITH A MEAL 180 tablet 2  . metoprolol tartrate (LOPRESSOR) 25 MG tablet Take 0.5 tablets (12.5 mg total) by mouth 2 (two) times daily. 90 tablet 2  . montelukast (SINGULAIR) 10 MG tablet Take 1 tablet (10 mg total) by mouth at bedtime. 90 tablet 3  . nitroGLYCERIN (NITROSTAT) 0.4 MG SL tablet Place 1 tablet (0.4 mg total) under the tongue every 5 (five) minutes as needed for chest pain. Only take up to 3 tablets. 6 tablet 0  . simvastatin (ZOCOR) 20 MG tablet Take 1 tablet (20 mg total) by mouth at bedtime. 90 tablet 3  . traMADol (ULTRAM) 50 MG tablet Take 1 tablet (50 mg total) by mouth daily as needed (For pain.). Reported on 01/01/2016 30 tablet 0   No current facility-administered medications for this visit.     REVIEW OF SYSTEMS:   .10 Point review of Systems was done is negative except as noted above.   PHYSICAL EXAMINATION: ECOG PERFORMANCE STATUS: 1 - Symptomatic but completely ambulatory  . Vitals:   02/15/18 1407  BP: (!) 185/85  Pulse: 68  Resp:  18  Temp: 98.6 F (37 C)  SpO2: 98%   Filed Weights   02/15/18 1407  Weight: 131 lb 1.6 oz (59.5 kg)   .Body mass index is 23.22 kg/m.  Marland Kitchen GENERAL:alert, in no acute distress and comfortable SKIN: no acute rashes, no significant lesions EYES: conjunctiva are pink and non-injected, sclera anicteric OROPHARYNX: MMM, no exudates, no oropharyngeal erythema or ulceration NECK: supple, no JVD LYMPH:  no palpable lymphadenopathy in the cervical, axillary or inguinal regions LUNGS: clear to auscultation b/l with normal respiratory effort HEART: regular rate & rhythm ABDOMEN:  normoactive bowel sounds , non tender, not distended. Extremity: no pedal edema PSYCH: alert & oriented x 3 with fluent speech NEURO: no focal motor/sensory deficits  LABORATORY DATA:  I have reviewed the data as listed  . CBC Latest Ref Rng & Units 02/15/2018 12/13/2017 11/28/2017  WBC 3.9 - 10.3 K/uL 56.8(HH) 58.9(HH) 62.5(HH)  Hemoglobin 11.6 - 15.9 g/dL - 10.8(L) 11.0(L)  Hematocrit 34.8 - 46.6 % 37.2 34.5(L) 34.5(L)  Platelets 145 - 400 K/uL 257 362 201    . CMP Latest Ref Rng & Units 02/15/2018 12/13/2017 11/29/2017  Glucose 70 - 140 mg/dL 107 98 161(H)  BUN 7 - 26 mg/dL 10 10.2 9  Creatinine 0.60 - 1.10 mg/dL 1.30(H) 1.3(H) 1.03(H)  Sodium 136 - 145 mmol/L 145 142 144  Potassium 3.5 - 5.1 mmol/L 4.2 4.5 4.5  Chloride 98 - 109 mmol/L 108 - 102  CO2 22 - 29 mmol/L 26 30(H) 28  Calcium 8.4 - 10.4 mg/dL 9.5 8.9 9.1  Total Protein 6.4 - 8.3 g/dL 6.8 7.0 -  Total Bilirubin 0.2 - 1.2 mg/dL 0.3 0.27 -  Alkaline Phos 40 - 150 U/L 103 133 -  AST 5 - 34 U/L 12 11 -  ALT 0 - 55 U/L <6 8 -       . Lab Results  Component Value Date   LDH 254 (H) 02/15/2018    RADIOGRAPHIC STUDIES: I have personally reviewed the radiological images as listed and agreed with the findings in the report. No results found. Surgical Pathology 12/13/2017   ASSESSMENT & PLAN:   SERAYA JOBST is a wonderful 72 y.o.  female with    1. Rai 1 Chronic Lymphocytic Leukemia (Newly diagnosed) - Trisomy 12 mutation. No associated thrombocytopenia.  Mild anemia - likely from acute blood loss from significant epistaxis. hgb improved from 10.8---> 11.4 No constitutional symptoms. Minimal LNaednopathy No splenomegaly  Plan -I again address multiple questions patient her husband had about her recent new diagnosis of CLL, naturnal history, prognosis and treatment rational. -CT chest/abd/pelvis - showed borderline LNadenoapthy , no splenomegaly. -labs stable - reviewed with patient. -notes some chronic hot flashes/night sweat - patient unable to define this as new. -No overt indication to treatment the patient's CLL at this time. -counseled on important symptoms to monitor for  -Explained criteria for treatment and the symptoms we monitor  -We will monitor blood tests at this time -Informed pt of incidental findings of thickening of nerve on scan and discussed if any accompanying symptoms, would have to obtain MRI -Recommend repeat CT scan for incidental finding of lung nodules on scan. Will need rpt CT chest in 3 months -Recommend complete cessation of smoking and counseled pt on importance of this with regards to her health and lungs -Discussed pt labwork today; blood chemistries are stable, LDH has increased a little. Hgb is stable and increased from 10.8 two months ago to 11.4 today. WBC are elevated but stable. Platelets WNL. -We let her know to call us back if she notices worsening of her symptoms  #2 .Lung nodules on CT. -given smoking hx will need to monitor -rpt CT chest in 3 months  #3 Patient Active Problem List   Diagnosis Date Noted  . Pulmonary nodules 01/04/2018  . Mass of left lower leg 01/04/2018  . Bradycardia 07/01/2016  . Chronic kidney disease (CKD), stage III (moderate) ([place]) 06/04/2016  . Shoulder pain, left 04/06/2016  . Chronic back pain 10/02/2015  . Acute right hip pain  03/13/2012  . Episodic tension type headache 09/24/2010  . GAIT IMBALANCE 12/10/2009  . DM (diabetes mellitus), type 2 with peripheral vascular complications ([place]) 84/16/6063  . Chronic lymphocytic leukemia ([place]) 04/11/2009  . Adjustment disorder with depressed mood 12/05/2008  . PERIPHERAL VASCULAR DISEASE 10/13/2007  . HYPERCHOLESTEROLEMIA 02/23/2007  . ANXIETY 02/23/2007  . TOBACCO DEPENDENCE 02/23/2007  . HYPERTENSION, BENIGN SYSTEMIC 02/23/2007  . COPD 02/23/2007  . GASTROESOPHAGEAL REFLUX, NO ESOPHAGITIS 02/23/2007   Mx per PCP  PLAN  RTC with Dr Irene Limbo in 2 months with labs   All of the patients questions were answered with apparent satisfaction. The patient knows to call the clinic with any problems, questions or concerns.  . The total time spent in the appointment was 20 minutes and more than 50% was on counseling and direct patient cares.      Sullivan Lone MD [place] AAHIVMS [facility] Hematology/Oncology Physician [facility]  (Office):       [phone] (Work cell):  [phone] (Fax):           [phone]  This document serves as a record of services personally performed by Sullivan Lone, MD.  It was created on his behalf by Baldwin Jamaica, a trained medical scribe. The creation of this record is based on the scribe's personal observations and the provider's statements to them.   .I have reviewed the above documentation for accuracy and completeness, and I agree with the above. Brunetta Genera MD MS

## 2018-02-15 ENCOUNTER — Other Ambulatory Visit: Payer: Self-pay | Admitting: Internal Medicine

## 2018-02-15 ENCOUNTER — Inpatient Hospital Stay: Payer: Medicare Other

## 2018-02-15 ENCOUNTER — Encounter: Payer: Self-pay | Admitting: Hematology

## 2018-02-15 ENCOUNTER — Inpatient Hospital Stay: Payer: Medicare Other | Attending: Hematology | Admitting: Hematology

## 2018-02-15 ENCOUNTER — Telehealth: Payer: Self-pay | Admitting: Hematology

## 2018-02-15 VITALS — BP 185/85 | HR 68 | Temp 98.6°F | Resp 18 | Ht 63.0 in | Wt 131.1 lb

## 2018-02-15 DIAGNOSIS — R61 Generalized hyperhidrosis: Secondary | ICD-10-CM | POA: Diagnosis not present

## 2018-02-15 DIAGNOSIS — R911 Solitary pulmonary nodule: Secondary | ICD-10-CM

## 2018-02-15 DIAGNOSIS — D649 Anemia, unspecified: Secondary | ICD-10-CM | POA: Diagnosis not present

## 2018-02-15 DIAGNOSIS — C9110 Chronic lymphocytic leukemia of B-cell type not having achieved remission: Secondary | ICD-10-CM

## 2018-02-15 DIAGNOSIS — Z720 Tobacco use: Secondary | ICD-10-CM | POA: Diagnosis not present

## 2018-02-15 LAB — CBC WITH DIFFERENTIAL (CANCER CENTER ONLY)
Basophils Absolute: 0.4 10*3/uL — ABNORMAL HIGH (ref 0.0–0.1)
Basophils Relative: 1 %
Eosinophils Absolute: 0.3 10*3/uL (ref 0.0–0.5)
Eosinophils Relative: 0 %
HCT: 37.2 % (ref 34.8–46.6)
Hemoglobin: 11.4 g/dL — ABNORMAL LOW (ref 11.6–15.9)
Lymphocytes Relative: 88 %
Lymphs Abs: 50 10*3/uL — ABNORMAL HIGH (ref 0.9–3.3)
MCH: 26 pg (ref 25.1–34.0)
MCHC: 30.5 g/dL — ABNORMAL LOW (ref 31.5–36.0)
MCV: 85.2 fL (ref 79.5–101.0)
Monocytes Absolute: 0.3 10*3/uL (ref 0.1–0.9)
Monocytes Relative: 1 %
Neutro Abs: 5.9 10*3/uL (ref 1.5–6.5)
Neutrophils Relative %: 10 %
Platelet Count: 257 10*3/uL (ref 145–400)
RBC: 4.37 MIL/uL (ref 3.70–5.45)
RDW: 17.1 % — ABNORMAL HIGH (ref 11.2–14.5)
WBC Count: 56.8 10*3/uL (ref 3.9–10.3)

## 2018-02-15 LAB — CMP (CANCER CENTER ONLY)
ALT: <6 U/L (ref 0–55)
AST: 12 U/L (ref 5–34)
Albumin: 3.9 g/dL (ref 3.5–5.0)
Alkaline Phosphatase: 103 U/L (ref 40–150)
Anion gap: 11 (ref 3–11)
BUN: 10 mg/dL (ref 7–26)
CO2: 26 mmol/L (ref 22–29)
Calcium: 9.5 mg/dL (ref 8.4–10.4)
Chloride: 108 mmol/L (ref 98–109)
Creatinine: 1.3 mg/dL — ABNORMAL HIGH (ref 0.60–1.10)
GFR, Est AFR Am: 47 mL/min — ABNORMAL LOW (ref >60)
GFR, Estimated: 41 mL/min — ABNORMAL LOW (ref >60)
Glucose, Bld: 107 mg/dL (ref 70–140)
Potassium: 4.2 mmol/L (ref 3.5–5.1)
Sodium: 145 mmol/L (ref 136–145)
Total Bilirubin: 0.3 mg/dL (ref 0.2–1.2)
Total Protein: 6.8 g/dL (ref 6.4–8.3)

## 2018-02-15 LAB — RETICULOCYTES
RBC.: 4.27 MIL/uL (ref 3.70–5.45)
Retic Count, Absolute: 64.1 10*3/uL (ref 33.7–90.7)
Retic Ct Pct: 1.5 % (ref 0.7–2.1)

## 2018-02-15 LAB — LACTATE DEHYDROGENASE: LDH: 254 U/L — ABNORMAL HIGH (ref 125–245)

## 2018-02-15 NOTE — Telephone Encounter (Signed)
Appointments scheduled AVS/ Calendar printed per 2/20 los

## 2018-02-15 NOTE — Patient Instructions (Signed)
Thank you for choosing [facility] to provide your oncology and hematology care.  To afford each patient quality time with our providers, please arrive 30 minutes before your scheduled appointment time.  If you arrive late for your appointment, you may be asked to reschedule.  We strive to give you quality time with our providers, and arriving late affects you and other patients whose appointments are after yours.   If you are a no show for multiple scheduled visits, you may be dismissed from the clinic at the providers discretion.    Again, thank you for choosing [facility], our hope is that these requests will decrease the amount of time that you wait before being seen by our physicians.  ______________________________________________________________________  Should you have questions after your visit to the [facility], please contact our office at [phone] between the hours of 8:30 and 4:30 p.m.    Voicemails left after 4:30p.m will not be returned until the following business day.    For prescription refill requests, please have your pharmacy contact us directly.  Please also try to allow 48 hours for prescription requests.    Please contact the scheduling department for questions regarding scheduling.  For scheduling of procedures such as PET scans, CT scans, MRI, Ultrasound, etc please contact central scheduling at [phone].    Resources For Cancer Patients and Caregivers:   Oncolink.org:  A wonderful resource for patients and healthcare providers for information regarding your disease, ways to tract your treatment, what to expect, etc.     American Cancer Society:  800-227-2345  Can help patients locate various types of support and financial assistance  Cancer Care: 1-800-813-HOPE (4673) Provides financial assistance, online support groups, medication/co-pay assistance.    [place] DSS:  336-641-3447 Where to apply for food  stamps, Medicaid, and utility assistance  Medicare Rights Center: 800-333-4114 Helps people with Medicare understand their rights and benefits, navigate the Medicare system, and secure the quality healthcare they deserve  SCAT: 336-333-6589 [employer] [employer]'s shared-ride transportation service for eligible riders who have a disability that prevents them from riding the fixed route bus.    For additional information on assistance programs please contact our social worker:   Grier Hock/Abigail Elmore:  [phone]            

## 2018-02-16 LAB — HAPTOGLOBIN: Haptoglobin: 179 mg/dL (ref 34–200)

## 2018-03-24 ENCOUNTER — Other Ambulatory Visit: Payer: Self-pay

## 2018-03-24 DIAGNOSIS — I739 Peripheral vascular disease, unspecified: Secondary | ICD-10-CM

## 2018-03-27 ENCOUNTER — Encounter: Payer: Medicare Other | Admitting: Surgery

## 2018-03-27 ENCOUNTER — Inpatient Hospital Stay (HOSPITAL_COMMUNITY): Admission: RE | Admit: 2018-03-27 | Payer: Medicare Other | Source: Ambulatory Visit

## 2018-03-28 ENCOUNTER — Other Ambulatory Visit: Payer: Self-pay | Admitting: *Deleted

## 2018-03-28 MED ORDER — CYCLOBENZAPRINE HCL 10 MG PO TABS: ORAL_TABLET | ORAL | 0 refills | Status: DC

## 2018-03-28 MED ORDER — TRAMADOL HCL 50 MG PO TABS: 50.0000 mg | ORAL_TABLET | Freq: Every day | ORAL | 0 refills | Status: DC | PRN

## 2018-04-24 ENCOUNTER — Inpatient Hospital Stay: Payer: Medicare Other

## 2018-04-24 ENCOUNTER — Telehealth: Payer: Self-pay | Admitting: Hematology

## 2018-04-24 ENCOUNTER — Inpatient Hospital Stay: Payer: Medicare Other | Admitting: Hematology

## 2018-04-24 NOTE — Telephone Encounter (Signed)
Patient called to reschedule  °

## 2018-04-25 ENCOUNTER — Other Ambulatory Visit: Payer: Self-pay | Admitting: Internal Medicine

## 2018-04-25 MED ORDER — ALBUTEROL SULFATE HFA 108 (90 BASE) MCG/ACT IN AERS: 2.0000 | INHALATION_SPRAY | Freq: Four times a day (QID) | RESPIRATORY_TRACT | 2 refills | Status: DC | PRN

## 2018-04-25 NOTE — Progress Notes (Signed)
[facility] Phone: [phone]   Date of Visit: 04/26/2018   HPI:  Diabetes:  -Metformin 500 mg twice daily -Patient has not been checking her CBGs as she does not have any more supplies  -Reports that she has brought her carbohydrate intake -Is unable to do much exercise due to her chronic pain  Cough: -Reports of intermittently productive cough since Friday.  She has also been having some wheezing with some shortness of breath.  No chest pain.  No fevers or chills.  No rhinorrhea, nasal congestion, sore throat -She has a history of COPD and reports taking Singulair 10 mg daily and Advair 1 puff twice a day. -She has run out of her albuterol and has not had any for this current wheezing flare  Left shoulder pain: -This is a chronic recurrent issue.  Last seen for this in clinic in 08/2016 -Reports of shoulder pain that radiates down to the mid bicep.  It is achy in nature -No recent injury or overuse.  No focal weakness and no numbness.  No neck pain.  Has not tried anything for this  ROS: See HPI.  [facility]:  PMH: HTN DM2  PVD CLL COPD GERD CKD III Tobacco Dependence  HLD  PHYSICAL EXAM: BP 140/70   Pulse 77   Temp 97.9 F (36.6 C) (Oral)   Wt 130 lb (59 kg)   SpO2 95%   BMI 23.03 kg/m  GEN: NAD HEENT: Atraumatic, normocephalic, neck supple, EOMI, sclera clear, oropharynx normal CV: RRR, no murmurs, rubs, or gallops PULM: Normal effort, diffuse expiratory wheezing bilaterally, no crackles MSK: Shoulders are symmetrical.  No tenderness to palpation of the left clavicle, left AC joint or left scapula.  Tenderness to palpation of the bicipital groove.  Normal range of motion of the shoulder including active flexion abduction abduction and internal rotation, but patient does report of discomfort with these movements.  Upper extremity strength is normal.  Normal sensation to light touch of the upper extremities bilaterally.  Hawkins signs  positive.  Empty can test produces pain but has normal strength with this test.  Normal strength with external rotation against resistance (as well as internal rotation against resistance).   SKIN: No rash or cyanosis; warm and well-perfused EXTR: No lower extremity edema or calf tenderness PSYCH: Mood and affect euthymic, normal rate and volume of speech NEURO: Awake, alert, normal speech;  Diabetic Foot Exam - Simple   Simple Foot Form Visual Inspection No deformities, no ulcerations, no other skin breakdown bilaterally:  Yes Sensation Testing See comments:  Yes Pulse Check See comments:  Yes Comments On the right leg, her DP pulse is faint compared to her left leg.  She has decreased sensation to monofilament testing on the plantar aspect of her right foot.  She has normal monofilament testing on the left foot.      ASSESSMENT/PLAN:  COPD with acute exacerbation: Symptoms are consistent with acute exacerbation of COPD.  Her wheezing improved with one DuoNeb in clinic.  Her vitals are stable and she is afebrile.  Unlikely this is pneumonia.  Will treat with prednisone 40 mg daily for 5 days and azithromycin 500 mg on day 1 and then 250 mg daily on days 2-5.  Albuterol refilled  DM (diabetes mellitus), type 2 with peripheral vascular complications K4Y well controlled at 6.5 today.  Continue metformin for now.  Will repeat BMP as she is close to the creatinine clearance cutoff for discontinuation of metformin.  Again she wants to wait until she can get prior records from her for health maintenance tasks such as pneumonia vaccine.  Diabetic supplies reordered  Shoulder pain, left Symptoms are most consistent with rotator cuff impingement.  No signs of rotator cuff tear or adhesive capsulitis.  Discussed option of steroid injection but patient would like to wait on this.  She would like to try exercises.  Recommended avoiding NSAIDs due to her chronic kidney disease.  Follow-up in 4 to 6  weeks if symptoms do not improve   PERIPHERAL VASCULAR DISEASE Patient reports that she needs to make a follow-up visit with pain and vascular specialists.  Provided contact information to call them.  Her abnormal monofilament test on the right lower extremity is possibly due to her peripheral vascular disease.  There is no signs of acute ischemia to her lower extremity.  Smiley Houseman, MD PGY [place]

## 2018-04-25 NOTE — Progress Notes (Signed)
During her husband's visit today, patient asked if I could call [facility] as there was some difficulty getting her albuterol inhaler. Called today and was told that plan covers Proair not Proventil. Rx sent

## 2018-04-26 ENCOUNTER — Encounter: Payer: Self-pay | Admitting: Internal Medicine

## 2018-04-26 ENCOUNTER — Other Ambulatory Visit: Payer: Self-pay

## 2018-04-26 ENCOUNTER — Ambulatory Visit (INDEPENDENT_AMBULATORY_CARE_PROVIDER_SITE_OTHER): Payer: Medicare Other | Admitting: Internal Medicine

## 2018-04-26 VITALS — BP 140/70 | HR 77 | Temp 97.9°F | Wt 130.0 lb

## 2018-04-26 DIAGNOSIS — N183 Chronic kidney disease, stage 3 unspecified: Secondary | ICD-10-CM

## 2018-04-26 DIAGNOSIS — I739 Peripheral vascular disease, unspecified: Secondary | ICD-10-CM

## 2018-04-26 DIAGNOSIS — M25512 Pain in left shoulder: Secondary | ICD-10-CM | POA: Diagnosis not present

## 2018-04-26 DIAGNOSIS — J441 Chronic obstructive pulmonary disease with (acute) exacerbation: Secondary | ICD-10-CM | POA: Diagnosis not present

## 2018-04-26 DIAGNOSIS — E1151 Type 2 diabetes mellitus with diabetic peripheral angiopathy without gangrene: Secondary | ICD-10-CM | POA: Diagnosis not present

## 2018-04-26 LAB — POCT GLYCOSYLATED HEMOGLOBIN (HGB A1C): Hemoglobin A1C: 6.5

## 2018-04-26 MED ORDER — ONETOUCH ULTRASOFT LANCETS MISC: 12 refills | Status: DC

## 2018-04-26 MED ORDER — PREDNISONE 20 MG PO TABS: 40.0000 mg | ORAL_TABLET | Freq: Every day | ORAL | 0 refills | Status: AC

## 2018-04-26 MED ORDER — AZITHROMYCIN 250 MG PO TABS: ORAL_TABLET | ORAL | 0 refills | Status: DC

## 2018-04-26 MED ORDER — ALBUTEROL SULFATE HFA 108 (90 BASE) MCG/ACT IN AERS: 2.0000 | INHALATION_SPRAY | Freq: Four times a day (QID) | RESPIRATORY_TRACT | 0 refills | Status: DC | PRN

## 2018-04-26 MED ORDER — IPRATROPIUM BROMIDE 0.02 % IN SOLN
0.5000 mg | Freq: Once | RESPIRATORY_TRACT | Status: AC
  Administered 2018-04-26: 0.5 mg via RESPIRATORY_TRACT

## 2018-04-26 MED ORDER — GLUCOSE BLOOD VI STRP: ORAL_STRIP | 12 refills | Status: DC

## 2018-04-26 MED ORDER — ONETOUCH VERIO W/DEVICE KIT: 1.0000 | PACK | Freq: Every day | 0 refills | Status: DC

## 2018-04-26 MED ORDER — ALBUTEROL SULFATE (2.5 MG/3ML) 0.083% IN NEBU
2.5000 mg | INHALATION_SOLUTION | Freq: Once | RESPIRATORY_TRACT | Status: AC
  Administered 2018-04-26: 2.5 mg via RESPIRATORY_TRACT

## 2018-04-26 NOTE — Assessment & Plan Note (Addendum)
A1c well controlled at 6.5 today.  Continue metformin for now.  Will repeat BMP as she is close to the creatinine clearance cutoff for discontinuation of metformin.  Again she wants to wait until she can get prior records from her for health maintenance tasks such as pneumonia vaccine.  Diabetic supplies reordered

## 2018-04-26 NOTE — Assessment & Plan Note (Signed)
Patient reports that she needs to make a follow-up visit with pain and vascular specialists.  Provided contact information to call them.  Her abnormal monofilament test on the right lower extremity is possibly due to her peripheral vascular disease.  There is no signs of acute ischemia to her lower extremity.

## 2018-04-26 NOTE — Patient Instructions (Addendum)
1) I sent diabetes supplies to [facility] 2) We are going to treat you for a COPD flare. Please take Prednisone 40mg  daily for 5 days and Azithromycin (antibiotic) as prescribed for 5 days.  3) please try the shoulder exercises. FOllowup in 4 weeks if it does not improve.    Shoulder Range of Motion Exercises Shoulder range of motion (ROM) exercises are designed to keep the shoulder moving freely. They are often recommended for people who have shoulder pain. Phase 1 exercises When you are able, do this exercise 5-6 days per week, or as told by your health care provider. Work toward doing 2 sets of 10 swings. Pendulum Exercise How To Do This Exercise Lying Down 1. Lie face-down on a bed with your abdomen close to the side of the bed. 2. Let your arm hang over the side of the bed. 3. Relax your shoulder, arm, and hand. 4. Slowly and gently swing your arm forward and back. Do not use your neck muscles to swing your arm. They should be relaxed. If you are struggling to swing your arm, have someone gently swing it for you. When you do this exercise for the first time, swing your arm at a 15 degree angle for 15 seconds, or swing your arm 10 times. As pain lessens over time, increase the angle of the swing to 30-45 degrees. 5. Repeat steps 1-4 with the other arm.  How To Do This Exercise While Standing 1. Stand next to a sturdy chair or table and hold on to it with your hand. 1. Bend forward at the waist. 2. Bend your knees slightly. 3. Relax your other arm and let it hang limp. 4. Relax the shoulder blade of the arm that is hanging and let it drop. 5. While keeping your shoulder relaxed, use body motion to swing your arm in small circles. The first time you do this exercise, swing your arm for about 30 seconds or 10 times. When you do it next time, swing your arm for a little longer. 6. Stand up tall and relax. 7. Repeat steps 1-7, this time changing the direction of the circles. 2. Repeat steps  1-8 with the other arm.  Phase 2 exercises Do these exercises 3-4 times per day on 5-6 days per week or as told by your health care provider. Work toward holding the stretch for 20 seconds. Stretching Exercise 1 1. Lift your arm straight out in front of you. 2. Bend your arm 90 degrees at the elbow (right angle) so your forearm goes across your body and looks like the letter "L." 3. Use your other arm to gently pull the elbow forward and across your body. 4. Repeat steps 1-3 with the other arm. Stretching Exercise 2 You will need a towel or rope for this exercise. 1. Bend one arm behind your back with the palm facing outward. 2. Hold a towel with your other hand. 3. Reach the arm that holds the towel above your head, and bend that arm at the elbow. Your wrist should be behind your neck. 4. Use your free hand to grab the free end of the towel. 5. With the higher hand, gently pull the towel up behind you. 6. With the lower hand, pull the towel down behind you. 7. Repeat steps 1-6 with the other arm.  Phase 3 exercises Do each of these exercises at four different times of day (sessions) every day or as told by your health care provider. To begin with,  repeat each exercise 5 times (repetitions). Work toward doing 3 sets of 12 repetitions or as told by your health care provider. Strengthening Exercise 1 You will need a light weight for this activity. As you grow stronger, you may use a heavier weight. 1. Standing with a weight in your hand, lift your arm straight out to the side until it is at the same height as your shoulder. 2. Bend your arm at 90 degrees so that your fingers are pointing to the ceiling. 3. Slowly raise your hand until your arm is straight up in the air. 4. Repeat steps 1-3 with the other arm.  Strengthening Exercise 2 You will need a light weight for this activity. As you grow stronger, you may use a heavier weight. 1. Standing with a weight in your hand, gradually move  your straight arm in an arc, starting at your side, then out in front of you, then straight up over your head. 2. Gradually move your other arm in an arc, starting at your side, then out in front of you, then straight up over your head. 3. Repeat steps 1-2 with the other arm.  Strengthening Exercise 3 You will need an elastic band for this activity. As you grow stronger, gradually increase the size of the bands or increase the number of bands that you use at one time. 1. While standing, hold an elastic band in one hand and raise that arm up in the air. 2. With your other hand, pull down the band until that hand is by your side. 3. Repeat steps 1-2 with the other arm.  This information is not intended to replace advice given to you by your health care provider. Make sure you discuss any questions you have with your health care provider. Document Released: 09/11/2003 Document Revised: 08/08/2016 Document Reviewed: 12/09/2014 Elsevier Interactive Patient Education  [employer].

## 2018-04-26 NOTE — Assessment & Plan Note (Signed)
Symptoms are most consistent with rotator cuff impingement.  No signs of rotator cuff tear or adhesive capsulitis.  Discussed option of steroid injection but patient would like to wait on this.  She would like to try exercises.  Recommended avoiding NSAIDs due to her chronic kidney disease.  Follow-up in 4 to 6 weeks if symptoms do not improve

## 2018-04-27 LAB — BASIC METABOLIC PANEL
BUN/Creatinine Ratio: 7 — ABNORMAL LOW (ref 12–28)
BUN: 8 mg/dL (ref 8–27)
CO2: 27 mmol/L (ref 20–29)
Calcium: 9.6 mg/dL (ref 8.7–10.3)
Chloride: 102 mmol/L (ref 96–106)
Creatinine, Ser: 1.19 mg/dL — ABNORMAL HIGH (ref 0.57–1.00)
GFR calc Af Amer: 53 mL/min/{1.73_m2} — ABNORMAL LOW (ref >59)
GFR calc non Af Amer: 46 mL/min/{1.73_m2} — ABNORMAL LOW (ref >59)
Glucose: 120 mg/dL — ABNORMAL HIGH (ref 65–99)
Potassium: 5 mmol/L (ref 3.5–5.2)
Sodium: 145 mmol/L — ABNORMAL HIGH (ref 134–144)

## 2018-05-05 ENCOUNTER — Ambulatory Visit (HOSPITAL_COMMUNITY)
Admission: RE | Admit: 2018-05-05 | Discharge: 2018-05-05 | Disposition: A | Discharge status: Home / Self Care | Payer: Medicare Other | Source: Ambulatory Visit | Attending: Surgery | Admitting: Surgery

## 2018-05-05 DIAGNOSIS — I739 Peripheral vascular disease, unspecified: Secondary | ICD-10-CM | POA: Diagnosis not present

## 2018-05-05 DIAGNOSIS — F17200 Nicotine dependence, unspecified, uncomplicated: Secondary | ICD-10-CM | POA: Diagnosis not present

## 2018-05-05 DIAGNOSIS — E785 Hyperlipidemia, unspecified: Secondary | ICD-10-CM | POA: Diagnosis not present

## 2018-05-05 DIAGNOSIS — I10 Essential (primary) hypertension: Secondary | ICD-10-CM | POA: Diagnosis not present

## 2018-05-08 ENCOUNTER — Other Ambulatory Visit: Payer: Self-pay

## 2018-05-08 ENCOUNTER — Ambulatory Visit (INDEPENDENT_AMBULATORY_CARE_PROVIDER_SITE_OTHER): Payer: Medicare Other | Admitting: Surgery

## 2018-05-08 ENCOUNTER — Encounter: Payer: Self-pay | Admitting: Surgery

## 2018-05-08 VITALS — BP 172/81 | HR 65 | Temp 97.7°F | Resp 16 | Ht 63.0 in | Wt 131.0 lb

## 2018-05-08 DIAGNOSIS — I70213 Atherosclerosis of native arteries of extremities with intermittent claudication, bilateral legs: Secondary | ICD-10-CM | POA: Diagnosis not present

## 2018-05-08 NOTE — Progress Notes (Signed)
[facility] [place]  Patient name: [name] [name] MRN: [id number] DOB: 12-04-1946 Sex: female   REQUESTING PROVIDER:    Dr. Donney Dice   REASON FOR CONSULT:    PAD  HISTORY OF PRESENT ILLNESS:   [name] [name] is a 72 y.o. female, who is referred today for evaluation of claudication.  In 2009 I took her for angiogram for claudication and she was found to have an 18 cm right superficial femoral artery occlusion which was successfully recanalized and stented.  She then moved to [place].  She ultimately had a intervention in her left leg in [place] for claudication.  She does endorse leg pain which does not appear to be lifestyle limiting.  She does not have any nonhealing wounds.  She does not have rest pain.    The patient suffers from hypercholesterolemia which is managed with a statin.  She is medically managed for hypertension.  She is a current smoker.  She does suffer from COPD.  PAST MEDICAL HISTORY    Past Medical History:  Diagnosis Date  . Asthma   . CLL (chronic lymphoblastic leukemia)   . Constipation 11/15/2011  . COPD (chronic obstructive pulmonary disease) ([place])   . Diabetes mellitus without complication ([place])   . GERD (gastroesophageal reflux disease)   . Hyperlipidemia   . Hypertension   . PVD (peripheral vascular disease) ([place])   . Stroke [facility])    in 1998 due to tumor      FAMILY HISTORY   Family History  Problem Relation Age of Onset  . Heart disease Mother   . Asthma Mother   . Cancer Mother        uterine   . Depression Mother   . Heart attack Mother 59  . Hyperlipidemia Mother   . Hypertension Mother   . Stroke Mother   . Kidney disease Mother   . Heart attack Sister   . Stroke Sister   . Depression Sister   . Diabetes Sister   . Hyperlipidemia Sister   . Depression Sister   . Diabetes Sister   . Hyperlipidemia Sister   . HIV/AIDS Sister   . Diabetes Brother   .  Asthma Brother   . Hypertension Brother   . Cancer Maternal Aunt        lung  . Heart disease Maternal Grandmother   . Heart attack Maternal Grandmother   . Cancer Brother        colon  . HIV/AIDS Brother     SOCIAL HISTORY:   Social History   Socioeconomic History  . Marital status: Married    Spouse name: Not on file  . Number of children: 3  . Years of education: Not on file  . Highest education level: Not on file  Occupational History  . Occupation: DISABLED    Employer: DISABLED  Social Needs  . Financial resource strain: Not on file  . Food insecurity:    Worry: Not on file    Inability: Not on file  . Transportation needs:    Medical: Not on file    Non-medical: Not on file  Tobacco Use  . Smoking status: Current Some Day Smoker    Packs/day: 0.10    Years: 30.00    Pack years: 3.00    Types: Cigarettes    Start date: 01/01/1976  . Smokeless tobacco: Never Used  Substance and Sexual Activity  . Alcohol use: No    Alcohol/week: 0.0 oz  .  Drug use: No  . Sexual activity: Yes  Lifestyle  . Physical activity:    Days per week: Not on file    Minutes per session: Not on file  . Stress: Not on file  Relationships  . Social connections:    Talks on phone: Not on file    Gets together: Not on file    Attends religious service: Not on file    Active member of club or organization: Not on file    Attends meetings of clubs or organizations: Not on file    Relationship status: Not on file  . Intimate partner violence:    Fear of current or ex partner: Not on file    Emotionally abused: Not on file    Physically abused: Not on file    Forced sexual activity: Not on file  Other Topics Concern  . Not on file  Social History Narrative   Health Care POA:    Emergency Contact: Kaprice Kage [phone] (c)   End of Life Plan:    Who lives with you: Lives with husband   Any pets: none   Diet: Patient lacks financial resources for much food. Pt reports eating  what is available.   Exercise: Patient does not have an exercise plan.   Seatbelts: Patient reports wearing seatbelt when in vehicle.    [name] Exposure/Protection:   Hobbies: Bowling, computer games, Bingo      Has financial difficulties and transportation issues as she and her husband share transportation        ALLERGIES:    Allergies  Allergen Reactions  . Aspirin Other (See Comments)    irritates stomach  . Baclofen Other (See Comments)    Stomach irritation    CURRENT MEDICATIONS:    Current Outpatient Medications  Medication Sig Dispense Refill  . albuterol (PROAIR HFA) 108 (90 Base) MCG/ACT inhaler Inhale 2 puffs into the lungs every 6 (six) hours as needed for wheezing or shortness of breath. 18 g 0  . azithromycin (ZITHROMAX) 250 MG tablet Take 2 tablets on day 1, then 1 tablet daily on days 2-5. 6 each 0  . Blood Glucose Monitoring Suppl (ONETOUCH VERIO) w/Device KIT 1 Device by Does not apply route daily. 1 kit 0  . cilostazol (PLETAL) 50 MG tablet Take 1 tablet (50 mg total) by mouth 2 (two) times daily. 180 tablet 0  . cyclobenzaprine (FLEXERIL) 10 MG tablet TAKE 1 TABLET BY MOUTH  DAILY AS NEEDED FOR MUSCLE  SPASM(S) 30 tablet 0  . DULoxetine (CYMBALTA) 30 MG capsule Take 1 capsule (30 mg total) by mouth daily. 90 capsule 1  . enalapril (VASOTEC) 20 MG tablet Take 1 tablet (20 mg total) by mouth daily. 90 tablet 3  . famotidine (PEPCID) 20 MG tablet Take 1 tablet (20 mg total) by mouth daily. 90 tablet 3  . Fluticasone-Salmeterol (ADVAIR DISKUS) 250-50 MCG/DOSE AEPB Inhale 1 puff into the lungs 2 (two) times daily. 1 each 3  . glucose blood (ONETOUCH VERIO) test strip Use to check blood sugar once a day 100 each 12  . hydrochlorothiazide (MICROZIDE) 12.5 MG capsule Take 1 capsule (12.5 mg total) by mouth daily. 90 capsule 3  . Lancets (ONETOUCH ULTRASOFT) lancets Use to check blood sugar once a day 100 each 12  . loratadine (CLARITIN) 10 MG tablet Take 1 tablet (10  mg total) by mouth daily. 90 tablet 2  . metFORMIN (GLUCOPHAGE) 500 MG tablet TAKE 1 TABLET TWICE A DAY  WITH  A MEAL 180 tablet 2  . metoprolol tartrate (LOPRESSOR) 25 MG tablet Take 0.5 tablets (12.5 mg total) by mouth 2 (two) times daily. 90 tablet 2  . montelukast (SINGULAIR) 10 MG tablet Take 1 tablet (10 mg total) by mouth at bedtime. 90 tablet 3  . nitroGLYCERIN (NITROSTAT) 0.4 MG SL tablet DISSOLVE 1 TABLET UNDER  TONGUE EVERY 5 MINUTES AS  NEEDED FOR CHEST PAIN. MAX  3 TABLETS IN 15 MINUTES.  CALL 911 IF PAIN PERSISTS 25 tablet 0  . simvastatin (ZOCOR) 20 MG tablet Take 1 tablet (20 mg total) by mouth at bedtime. 90 tablet 3  . traMADol (ULTRAM) 50 MG tablet Take 1 tablet (50 mg total) by mouth daily as needed. for pain 30 tablet 0   No current facility-administered medications for this visit.     REVIEW OF SYSTEMS:   _0  denotes positive finding, _1  denotes negative finding Cardiac  Comments:  Chest pain or chest pressure:    Shortness of breath upon exertion:    Short of breath when lying flat:    Irregular heart rhythm:        Vascular    Pain in calf, thigh, or hip brought on by ambulation: x   Pain in feet at night that wakes you up from your sleep:     Blood clot in your veins:    Leg swelling:         Pulmonary    Oxygen at home:    Productive cough:     Wheezing:         Neurologic    Sudden weakness in arms or legs:     Sudden numbness in arms or legs:     Sudden onset of difficulty speaking or slurred speech:    Temporary loss of vision in one eye:     Problems with dizziness:         Gastrointestinal    Blood in stool:      Vomited blood:         Genitourinary    Burning when urinating:     Blood in urine:        Psychiatric    Major depression:         Hematologic    Bleeding problems:    Problems with blood clotting too easily:        Skin    Rashes or ulcers:        Constitutional    Fever or chills:     PHYSICAL EXAM:   Vitals:    05/08/18 1115  BP: (!) 172/81  Pulse: 65  Resp: 16  Temp: 97.7 F (36.5 C)  TempSrc: Core  SpO2: 98%  Weight: 131 lb (59.4 kg)  Height: _2  (1.6 m)    GENERAL: The patient is a well-nourished female, in no acute distress. The vital signs are documented above. CARDIAC: There is a regular rate and rhythm.  VASCULAR:  Pedal pulses are not palpable.  No carotid bruits. PULMONARY: Nonlabored respirations ABDOMEN: Soft and non-tender with normal.  No pulsatile mass MUSCULOSKELETAL: There are no major deformities or cyanosis. NEUROLOGIC: No focal weakness or paresthesias are detected. SKIN: There are no ulcers or rashes noted. PSYCHIATRIC: The patient has a normal affect.  STUDIES:   I have ordered and reviewed his vascular studies with the following findings: +-------+-----------+-----------+------------+------------+ ABI/TBIToday's ABIToday's TBIPrevious ABIPrevious TBI +-------+-----------+-----------+------------+------------+ Right 0.68    0.50    0.58           +-------+-----------+-----------+------------+------------+  Left  0.91    0.62    0.61           +-------+-----------+-----------+------------+------------+ Right Toe pressure=96 Left toe pressure=118  ASSESSMENT and PLAN   Atherosclerosis with claudication: The patient has undergone bilateral lower extremity interventions in the past.  Her symptoms appear to be tolerable at this time.  No intervention is recommended.  She has already tried cilostazol with minimal benefit.  As long as she remains without ulcers and without worsening symptoms, I will schedule her for follow-up in 1 year with ABIs and duplex of both legs.  She is on maximal medical therapy at this time which she will continue.   Annamarie Major, MD [facility] [phone] Pager [phone]

## 2018-05-10 NOTE — Progress Notes (Signed)
HEMATOLOGY/ONCOLOGY CLINIC NOTE  Date of Service: 05/11/18  Patient Care Team: Smiley Houseman, MD as PCP - General (Family Medicine) Pleasant, Eppie Gibson, RN as [place] Management  CHIEF COMPLAINTS/PURPOSE OF CONSULTATION:  F/u for CLL  HISTORY OF PRESENTING ILLNESS:   [name] [name] is a wonderful 72 y.o. female smoker with history of CLL who has been referred to [place] by Dr Dallas Schimke, Almond Lint, MD at [facility] for evaluation and management of elevated WBC/lymphocytes.   She is accompanied by her husband. She reported to ED on 11/27/2017 with cough and abdominal pain with COPD exacerbation. Her labs on this day showed WBC elevated at 62.5. She has not previously seen a hematologist. She reports a plan to quit smoking on the first day of the new year. She reports worsening decrease in balance and states she has a cane and walker that she uses as needed. She was taking Prednisone.  No weight loss/fevers/chills/night sweats/ palpable lumps or bumps.  On review of systems, pt reports back pain, change in balance, abdominal pain, weight loss and denies changes in BM, fever and any other accompanying symptoms.  INTERVAL HISTORY:  [name] [name] is here for a fu of her Chronic Leukocytic Leukemia. The patient's last visit with [place] was on 02/15/18. She is accompanied today by her husband. The pt reports that she is doing well overall.   The pt reports that she has developed a small sore on the inside of her bottom lip for 3 weeks. She doesn't think that it has gotten any better, and it is painful. She denies biting her lip or believing anything has rubbed against it. She has not followed up with this with her PCP but will do this. She continues to smoke and has needed to begin an inhaler.   She notes that she has felt more weak recently and denies significant weight changes, nor feeling significantly worse in the last 3 months. She notes some balance problems but  has not wanted to use her cane and reports discussing this with her PCP. She also describes chronic hot flashes but denies drenching night sweats.   Lab results today (516/19) of CBC, CMP, and Reticulocytes is as follows: all values are WNL except for WBC at 89.9k, Hgb at 11.3, MCHC at 30.9, RDW at 19.8, ANC at 12.5k, Lymphs Abs at 76.1k, Basophils Abs at 0.3k, Glucose at 171, Creatinine at 1.35, Total Protein at 5.9, Albumin at 3.4. LDH 05/11/18 is WNL at 192.   On review of systems, pt reports increased weakness, some fatigue, stable appetite, abdominal pains, regular bowel movements, hot flashes, and denies fevers, chills, night sweats, unexpected weight loss, back pain, leg swelling, and any other symptoms.   MEDICAL HISTORY:  Past Medical History:  Diagnosis Date  . Asthma   . CLL (chronic lymphoblastic leukemia)   . Constipation 11/15/2011  . COPD (chronic obstructive pulmonary disease) ([place])   . Diabetes mellitus without complication ([place])   . GERD (gastroesophageal reflux disease)   . Hyperlipidemia   . Hypertension   . PVD (peripheral vascular disease) ([place])   . Stroke [facility])    in 1998 due to tumor     SURGICAL HISTORY: Past Surgical History:  Procedure Laterality Date  . ABI  02/2012   ABI <0.65 BL 02/2012  . BRAIN MENINGIOMA EXCISION    . Bypass grafting of RLE for PAD claudication     . [place],  78 ,79  . CHOLECYSTECTOMY, LAPAROSCOPIC    . ESOPHAGEAL DILATION      SOCIAL HISTORY: Social History   Socioeconomic History  . Marital status: Married    Spouse name: Not on file  . Number of children: 3  . Years of education: Not on file  . Highest education level: Not on file  Occupational History  . Occupation: DISABLED    Employer: DISABLED  Social Needs  . Financial resource strain: Not on file  . Food insecurity:    Worry: Not on file    Inability: Not on file  . Transportation needs:    Medical: Not on file    Non-medical: Not on file    Tobacco Use  . Smoking status: Current Some Day Smoker    Packs/day: 0.10    Years: 30.00    Pack years: 3.00    Types: Cigarettes    Start date: 01/01/1976  . Smokeless tobacco: Never Used  Substance and Sexual Activity  . Alcohol use: No    Alcohol/week: 0.0 oz  . Drug use: No  . Sexual activity: Yes  Lifestyle  . Physical activity:    Days per week: Not on file    Minutes per session: Not on file  . Stress: Not on file  Relationships  . Social connections:    Talks on phone: Not on file    Gets together: Not on file    Attends religious service: Not on file    Active member of club or organization: Not on file    Attends meetings of clubs or organizations: Not on file    Relationship status: Not on file  . Intimate partner violence:    Fear of current or ex partner: Not on file    Emotionally abused: Not on file    Physically abused: Not on file    Forced sexual activity: Not on file  Other Topics Concern  . Not on file  Social History Narrative   Health Care POA:    Emergency Contact: Kameelah Minish [phone] (c)   End of Life Plan:    Who lives with you: Lives with husband   Any pets: none   Diet: Patient lacks financial resources for much food. Pt reports eating what is available.   Exercise: Patient does not have an exercise plan.   Seatbelts: Patient reports wearing seatbelt when in vehicle.    [name] Exposure/Protection:   Hobbies: Bowling, computer games, Bingo      Has financial difficulties and transportation issues as she and her husband share transportation        FAMILY HISTORY: Family History  Problem Relation Age of Onset  . Heart disease Mother   . Asthma Mother   . Cancer Mother        uterine   . Depression Mother   . Heart attack Mother 81  . Hyperlipidemia Mother   . Hypertension Mother   . Stroke Mother   . Kidney disease Mother   . Heart attack Sister   . Stroke Sister   . Depression Sister   . Diabetes Sister   . Hyperlipidemia  Sister   . Depression Sister   . Diabetes Sister   . Hyperlipidemia Sister   . HIV/AIDS Sister   . Diabetes Brother   . Asthma Brother   . Hypertension Brother   . Cancer Maternal Aunt        lung  . Heart disease Maternal Grandmother   . Heart attack  Maternal Grandmother   . Cancer Brother        colon  . HIV/AIDS Brother     ALLERGIES:  is allergic to aspirin and baclofen.  MEDICATIONS:  Current Outpatient Medications  Medication Sig Dispense Refill  . albuterol (PROAIR HFA) 108 (90 Base) MCG/ACT inhaler Inhale 2 puffs into the lungs every 6 (six) hours as needed for wheezing or shortness of breath. 18 g 0  . azithromycin (ZITHROMAX) 250 MG tablet Take 2 tablets on day 1, then 1 tablet daily on days 2-5. 6 each 0  . Blood Glucose Monitoring Suppl (ONETOUCH VERIO) w/Device KIT 1 Device by Does not apply route daily. 1 kit 0  . cilostazol (PLETAL) 50 MG tablet Take 1 tablet (50 mg total) by mouth 2 (two) times daily. 180 tablet 0  . cyclobenzaprine (FLEXERIL) 10 MG tablet TAKE 1 TABLET BY MOUTH  DAILY AS NEEDED FOR MUSCLE  SPASM(S) 30 tablet 0  . DULoxetine (CYMBALTA) 30 MG capsule Take 1 capsule (30 mg total) by mouth daily. 90 capsule 1  . enalapril (VASOTEC) 20 MG tablet Take 1 tablet (20 mg total) by mouth daily. 90 tablet 3  . famotidine (PEPCID) 20 MG tablet Take 1 tablet (20 mg total) by mouth daily. 90 tablet 3  . Fluticasone-Salmeterol (ADVAIR DISKUS) 250-50 MCG/DOSE AEPB Inhale 1 puff into the lungs 2 (two) times daily. 1 each 3  . glucose blood (ONETOUCH VERIO) test strip Use to check blood sugar once a day 100 each 12  . hydrochlorothiazide (MICROZIDE) 12.5 MG capsule Take 1 capsule (12.5 mg total) by mouth daily. 90 capsule 3  . Lancets (ONETOUCH ULTRASOFT) lancets Use to check blood sugar once a day 100 each 12  . loratadine (CLARITIN) 10 MG tablet Take 1 tablet (10 mg total) by mouth daily. 90 tablet 2  . metFORMIN (GLUCOPHAGE) 500 MG tablet TAKE 1 TABLET TWICE A DAY   WITH A MEAL 180 tablet 2  . metoprolol tartrate (LOPRESSOR) 25 MG tablet Take 0.5 tablets (12.5 mg total) by mouth 2 (two) times daily. 90 tablet 2  . montelukast (SINGULAIR) 10 MG tablet Take 1 tablet (10 mg total) by mouth at bedtime. 90 tablet 3  . nitroGLYCERIN (NITROSTAT) 0.4 MG SL tablet DISSOLVE 1 TABLET UNDER  TONGUE EVERY 5 MINUTES AS  NEEDED FOR CHEST PAIN. MAX  3 TABLETS IN 15 MINUTES.  CALL 911 IF PAIN PERSISTS 25 tablet 0  . simvastatin (ZOCOR) 20 MG tablet Take 1 tablet (20 mg total) by mouth at bedtime. 90 tablet 3  . traMADol (ULTRAM) 50 MG tablet Take 1 tablet (50 mg total) by mouth daily as needed. for pain 30 tablet 0   No current facility-administered medications for this visit.     REVIEW OF SYSTEMS:   A 10+ POINT REVIEW OF SYSTEMS WAS OBTAINED including neurology, dermatology, psychiatry, cardiac, respiratory, lymph, extremities, GI, GU, Musculoskeletal, constitutional, breasts, reproductive, HEENT.  All pertinent positives are noted in the HPI.  All others are negative.    PHYSICAL EXAMINATION: ECOG PERFORMANCE STATUS: 1 - Symptomatic but completely ambulatory  . Vitals:   05/11/18 1030  BP: (!) 179/76  Pulse: 65  Resp: 18  Temp: 98.2 F (36.8 C)  SpO2: 98%   Filed Weights   05/11/18 1030  Weight: 132 lb (59.9 kg)   .Body mass index is 23.38 kg/m.  GENERAL:alert, in no acute distress and comfortable SKIN: no acute rashes, no significant lesions EYES: conjunctiva are pink and non-injected, sclera anicteric  OROPHARYNX: MMM, no exudates, no oropharyngeal erythema or ulceration NECK: supple, no JVD LYMPH:  no palpable lymphadenopathy in the cervical, axillary or inguinal regions LUNGS: clear to auscultation b/l with normal respiratory effort HEART: regular rate & rhythm ABDOMEN:  normoactive bowel sounds , non tender, not distended. Extremity: no pedal edema PSYCH: alert & oriented x 3 with fluent speech NEURO: no focal motor/sensory deficits    LABORATORY DATA:  I have reviewed the data as listed  . CBC Latest Ref Rng & Units 05/11/2018 02/15/2018 12/13/2017  WBC 3.9 - 10.3 K/uL 89.9(HH) 56.8(HH) 58.9(HH)  Hemoglobin 11.6 - 15.9 g/dL 11.3(L) 11.4(L) 10.8(L)  Hematocrit 34.8 - 46.6 % 36.7 37.2 34.5(L)  Platelets 145 - 400 K/uL 262 257 362    . CMP Latest Ref Rng & Units 05/11/2018 04/26/2018 02/15/2018  Glucose 70 - 140 mg/dL 171(H) 120(H) 107  BUN 7 - 26 mg/dL '18 8 10  ' Creatinine 0.60 - 1.10 mg/dL 1.35(H) 1.19(H) 1.30(H)  Sodium 136 - 145 mmol/L 140 145(H) 145  Potassium 3.5 - 5.1 mmol/L 4.3 5.0 4.2  Chloride 98 - 109 mmol/L 105 102 108  CO2 22 - 29 mmol/L '29 27 26  ' Calcium 8.4 - 10.4 mg/dL 8.7 9.6 9.5  Total Protein 6.4 - 8.3 g/dL 5.9(L) - 6.8  Total Bilirubin 0.2 - 1.2 mg/dL 0.2 - 0.3  Alkaline Phos 40 - 150 U/L 89 - 103  AST 5 - 34 U/L 17 - 12  ALT 0 - 55 U/L 16 - <6       . Lab Results  Component Value Date   LDH 192 05/11/2018    RADIOGRAPHIC STUDIES: I have personally reviewed the radiological images as listed and agreed with the findings in the report. No results found. Surgical Pathology 12/13/2017   ASSESSMENT & PLAN:   DORANN DAVIDSON is a wonderful 72 y.o. female with    1. Rai 1 Chronic Lymphocytic Leukemia (Newly diagnosed) - Trisomy 12 mutation. No associated thrombocytopenia.  Mild anemia - likely from acute blood loss from significant epistaxis. hgb stable at 11.3 No constitutional symptoms. Minimal LNaednopathy No splenomegaly  Plan -Discussed pt labwork today, 05/11/18; WBC increased to 89.9k, Hgb stable at 11.3, ANC increased to 12.5k, platelets normal at 262k.  -Discussed that the presence of constitutional symptoms or worsening blood counts would be reasons to treat her CLL.  -Pt notes chronic hot flashes but not specifically drenching night sweats -Advised that pt follow up with PCP and dentist regarding lower lip sore that tooth is impinging upon.  -Will see pt back in 3 months  unless she feels any worse before then.  -Counseled pt in smoking cessation for 5 minutes, she notes that nothing will help her stop smoking at this time. -CT Chest in 3 months -again counseled on important symptoms to monitor for  -Recommend complete cessation of smoking and counseled pt on importance of this with regards to her health and lungs -no strong indication to treatment the patients CLL at this time.  #2 .Lung nodules on CT. -given smoking hx will need to monitor -rpt CT chest in 3 months  #3 Patient Active Problem List   Diagnosis Date Noted  . Pulmonary nodules 01/04/2018  . Mass of left lower leg 01/04/2018  . Chronic kidney disease (CKD), stage III (moderate) ([place]) 06/04/2016  . Shoulder pain, left 04/06/2016  . Chronic back pain 10/02/2015  . Acute right hip pain 03/13/2012  . Episodic tension type headache 09/24/2010  . GAIT  IMBALANCE 12/10/2009  . DM (diabetes mellitus), type 2 with peripheral vascular complications ([place]) 99/37/1696  . Chronic lymphocytic leukemia ([place]) 04/11/2009  . PERIPHERAL VASCULAR DISEASE 10/13/2007  . HYPERCHOLESTEROLEMIA 02/23/2007  . ANXIETY 02/23/2007  . TOBACCO DEPENDENCE 02/23/2007  . HYPERTENSION, BENIGN SYSTEMIC 02/23/2007  . COPD 02/23/2007  . GASTROESOPHAGEAL REFLUX, NO ESOPHAGITIS 02/23/2007   Mx per PCP   PLAN CT chest wo contrast in 12 weeks RTC with Dr Irene Limbo in 3 months with labs and CT chest    All of the patients questions were answered with apparent satisfaction. The patient knows to call the clinic with any problems, questions or concerns.  . The total time spent in the appointment was 20 minutes and more than 50% was on counseling and direct patient cares.        Sullivan Lone MD [place] AAHIVMS [facility] Hematology/Oncology [facility]  (Office):       [phone] (Work cell):  [phone] (Fax):           [phone]  This document serves as a record of services personally performed  by Sullivan Lone, MD. It was created on his behalf by Baldwin Jamaica, a trained medical scribe. The creation of this record is based on the scribe's personal observations and the provider's statements to them.   .I have reviewed the above documentation for accuracy and completeness, and I agree with the above. Brunetta Genera MD MS

## 2018-05-11 ENCOUNTER — Inpatient Hospital Stay: Payer: Medicare Other | Attending: Hematology

## 2018-05-11 ENCOUNTER — Encounter: Payer: Self-pay | Admitting: Hematology

## 2018-05-11 ENCOUNTER — Inpatient Hospital Stay (HOSPITAL_BASED_OUTPATIENT_CLINIC_OR_DEPARTMENT_OTHER): Payer: Medicare Other | Admitting: Hematology

## 2018-05-11 ENCOUNTER — Telehealth: Payer: Self-pay | Admitting: Hematology

## 2018-05-11 VITALS — BP 179/76 | HR 65 | Temp 98.2°F | Resp 18 | Ht 63.0 in | Wt 132.0 lb

## 2018-05-11 DIAGNOSIS — D649 Anemia, unspecified: Secondary | ICD-10-CM | POA: Insufficient documentation

## 2018-05-11 DIAGNOSIS — Z720 Tobacco use: Secondary | ICD-10-CM | POA: Diagnosis not present

## 2018-05-11 DIAGNOSIS — R911 Solitary pulmonary nodule: Secondary | ICD-10-CM | POA: Insufficient documentation

## 2018-05-11 DIAGNOSIS — N951 Menopausal and female climacteric states: Secondary | ICD-10-CM | POA: Diagnosis not present

## 2018-05-11 DIAGNOSIS — C9110 Chronic lymphocytic leukemia of B-cell type not having achieved remission: Secondary | ICD-10-CM

## 2018-05-11 LAB — CBC WITH DIFFERENTIAL (CANCER CENTER ONLY)
Basophils Absolute: 0.3 10*3/uL — ABNORMAL HIGH (ref 0.0–0.1)
Basophils Relative: 0 %
Eosinophils Absolute: 0.4 10*3/uL (ref 0.0–0.5)
Eosinophils Relative: 0 %
HCT: 36.7 % (ref 34.8–46.6)
Hemoglobin: 11.3 g/dL — ABNORMAL LOW (ref 11.6–15.9)
Lymphocytes Relative: 85 %
Lymphs Abs: 76.1 10*3/uL — ABNORMAL HIGH (ref 0.9–3.3)
MCH: 26 pg (ref 25.1–34.0)
MCHC: 30.9 g/dL — ABNORMAL LOW (ref 31.5–36.0)
MCV: 84.2 fL (ref 79.5–101.0)
Monocytes Absolute: 0.6 10*3/uL (ref 0.1–0.9)
Monocytes Relative: 1 %
Neutro Abs: 12.5 10*3/uL — ABNORMAL HIGH (ref 1.5–6.5)
Neutrophils Relative %: 14 %
Platelet Count: 262 10*3/uL (ref 145–400)
RBC: 4.36 MIL/uL (ref 3.70–5.45)
RDW: 19.8 % — ABNORMAL HIGH (ref 11.2–14.5)
WBC Count: 89.9 10*3/uL (ref 3.9–10.3)

## 2018-05-11 LAB — CMP (CANCER CENTER ONLY)
ALT: 16 U/L (ref 0–55)
AST: 17 U/L (ref 5–34)
Albumin: 3.4 g/dL — ABNORMAL LOW (ref 3.5–5.0)
Alkaline Phosphatase: 89 U/L (ref 40–150)
Anion gap: 6 (ref 3–11)
BUN: 18 mg/dL (ref 7–26)
CO2: 29 mmol/L (ref 22–29)
Calcium: 8.7 mg/dL (ref 8.4–10.4)
Chloride: 105 mmol/L (ref 98–109)
Creatinine: 1.35 mg/dL — ABNORMAL HIGH (ref 0.60–1.10)
GFR, Est AFR Am: 45 mL/min — ABNORMAL LOW (ref >60)
GFR, Estimated: 39 mL/min — ABNORMAL LOW (ref >60)
Glucose, Bld: 171 mg/dL — ABNORMAL HIGH (ref 70–140)
Potassium: 4.3 mmol/L (ref 3.5–5.1)
Sodium: 140 mmol/L (ref 136–145)
Total Bilirubin: 0.2 mg/dL (ref 0.2–1.2)
Total Protein: 5.9 g/dL — ABNORMAL LOW (ref 6.4–8.3)

## 2018-05-11 LAB — LACTATE DEHYDROGENASE: LDH: 192 U/L (ref 125–245)

## 2018-05-11 LAB — RETICULOCYTES
RBC.: 4.39 MIL/uL (ref 3.70–5.45)
Retic Count, Absolute: 65.9 10*3/uL (ref 33.7–90.7)
Retic Ct Pct: 1.5 % (ref 0.7–2.1)

## 2018-05-11 NOTE — Telephone Encounter (Signed)
Appointments scheduled AVS/Calendar printed per 5/16 los °

## 2018-05-16 ENCOUNTER — Other Ambulatory Visit: Payer: Self-pay | Admitting: Internal Medicine

## 2018-05-23 NOTE — Progress Notes (Signed)
It appears that patient had elevated BP reading at her visit with the oncologist. Please see if she would come in for nurse visit to get BP checked.

## 2018-05-29 ENCOUNTER — Other Ambulatory Visit: Payer: Self-pay | Admitting: Internal Medicine

## 2018-05-30 ENCOUNTER — Telehealth: Payer: Self-pay | Admitting: *Deleted

## 2018-05-30 MED ORDER — LORATADINE 10 MG PO TABS: 10.0000 mg | ORAL_TABLET | Freq: Every day | ORAL | 2 refills | Status: DC

## 2018-05-30 NOTE — Telephone Encounter (Signed)
She will need to make an appointment. We need to figure out why she is falling as well.

## 2018-05-30 NOTE — Telephone Encounter (Signed)
Pt lm on nurse line.  She states " I have been falling at home and want to talk to the doctor about getting a nurse at home".  Will forward to MD. Charlett Merkle, Salome Spotted, [place]

## 2018-05-31 NOTE — Telephone Encounter (Signed)
Apt has been scheduled with pcp for next week, pt aware.

## 2018-06-07 ENCOUNTER — Other Ambulatory Visit: Payer: Self-pay

## 2018-06-07 ENCOUNTER — Ambulatory Visit (INDEPENDENT_AMBULATORY_CARE_PROVIDER_SITE_OTHER): Payer: Medicare Other | Admitting: Internal Medicine

## 2018-06-07 ENCOUNTER — Encounter: Payer: Self-pay | Admitting: Internal Medicine

## 2018-06-07 VITALS — BP 144/78 | HR 72 | Temp 98.1°F | Ht 63.0 in | Wt 129.0 lb

## 2018-06-07 DIAGNOSIS — G8929 Other chronic pain: Secondary | ICD-10-CM

## 2018-06-07 DIAGNOSIS — M25512 Pain in left shoulder: Secondary | ICD-10-CM | POA: Diagnosis not present

## 2018-06-07 DIAGNOSIS — R2681 Unsteadiness on feet: Secondary | ICD-10-CM

## 2018-06-07 MED ORDER — ROLLER WALKER MISC: 0 refills | Status: DC

## 2018-06-07 NOTE — Patient Instructions (Addendum)
1) I made a referral to physical therapy  Follow up in 2-3 months

## 2018-06-07 NOTE — Progress Notes (Signed)
   [facility] Phone: [phone]   Date of Visit: 06/07/2018   HPI:  Fall Evaluation:  - she has a history of stroke with residual mild right lower extremity weakness. At that time, she had PT and had walker/cane for ambulation  - she decided not to use a cane a while ago and did not have any issues - more recently, she has had falls at home. This was when she was ambulating. She did not trip over anything. She did not feel lightheaded or dizzy. She did not have chest pain, palpitations, or shortness of breath. She does not have any acute neurological deficits.  - she did not hit her head and did not lose consciousness. She did say that she hit her left shoulder.  - she does have chronic back and left shoulder pain. Her MRI lumbar spine in 11/2015 showed Bulging discs and shallow central disc protrusions at L4-5 and L5-S1. Mild bilateral lateral recess stenosis at L5-S1 could potentially involve the S1 nerve roots.  - she denies any saddle anesthesia, urinary or bowel incontinence, or worsening weakness.  - she does take Tramadol and Flexeril PRN for her chronic pain. Of note, she is allergic to baclofen.   ROS: See HPI.  [place]:  PMH reviewed in chart  SH reviewed: she continues to smoke cigarettes   PHYSICAL EXAM: BP (!) 144/78   Pulse 72   Temp 98.1 F (36.7 C) (Oral)   Ht 5\' 3"  (1.6 m)   Wt 129 lb (58.5 kg)   SpO2 99%   BMI 22.85 kg/m  GEN: NAD CV: RRR, no murmurs, rubs, or gallops PULM: CTAB, normal effort MSK:  Spine: lumbar paraspinal muscle tenderness on the right as well as tenderness over the right SI joint.  Normal range of motion of the hips bilaterally  Straight leg raise is equivocal.  LE strength: 4-5/5 in the right lower extremity (chronic), otherwise strength is normal in bilateral upper and lower extremities Normal patellar reflexes  Left Shoulder:  Tenderness to palpation over the [place] joint but otherwise normal. Range of motion is  slightly limited with flexion and abduction due to pain. Strength is normal. Neurovascularly intact.  SKIN: No rash or cyanosis; warm and well-perfused EXTR: No lower extremity edema or calf tenderness PSYCH: Mood and affect euthymic, normal rate and volume of speech NEURO: Awake, alert, no focal deficits grossly, normal speech;  ASSESSMENT/PLAN:  1. Gait instability Likely related to chronic lumbar pain. The right leg pain is likely coming from her back as her hip exam is unremarkable. Will write prescription for rolling walker with seat. Also discussed the importance of physical therapy.  - Ambulatory referral to Physical Therapy  2. Chronic left shoulder pain Likely multifactorial. She likely has some AC joint arthritis with rotator cuff syndrome. No signs of adhesive capsulitis or concern for tear. Offered subacromial injection, but patient declined. Willing to go to PT. Consider xray of shoulder at next visit.  - Ambulatory referral to Physical Therapy  Smiley Houseman, MD PGY [place]

## 2018-06-09 ENCOUNTER — Encounter: Payer: Self-pay | Admitting: Internal Medicine

## 2018-06-14 ENCOUNTER — Ambulatory Visit (INDEPENDENT_AMBULATORY_CARE_PROVIDER_SITE_OTHER): Payer: Medicare Other | Admitting: Family Medicine

## 2018-06-14 ENCOUNTER — Telehealth: Payer: Self-pay | Admitting: *Deleted

## 2018-06-14 VITALS — BP 150/74 | HR 68 | Temp 98.4°F | Ht 63.0 in | Wt 129.6 lb

## 2018-06-14 DIAGNOSIS — M75102 Unspecified rotator cuff tear or rupture of left shoulder, not specified as traumatic: Secondary | ICD-10-CM

## 2018-06-14 MED ORDER — METHYLPREDNISOLONE ACETATE 40 MG/ML IJ SUSP
40.0000 mg | Freq: Once | INTRAMUSCULAR | Status: AC
  Administered 2018-06-14: 40 mg via INTRAMUSCULAR

## 2018-06-14 NOTE — Telephone Encounter (Signed)
Pt calls nurse line in tears.  States that she declined a shoulder injection at her last visit but really needs it now.  She states that she "cried herself to sleep last night".  No appts left so placed as workin @ 1:30.  Pt is aware of the wait and "does not care as long as she can get the injection". Ankur Snowdon, Salome Spotted, CMA

## 2018-06-14 NOTE — Patient Instructions (Signed)
Thank you for coming in today, it was so nice to see you! Today we talked about:    Shoulder pain: I gave you a corticosteroid shoulder injection today.  You should feel some relief from this.  Continue taking your other pain medications as prescribed.  There is always option to send you to a orthopedic or sports medicine specialist in the future if your pain becomes persistent.  You can also have another injection in about 3 months if you need.  If you have any questions or concerns, please do not hesitate to call the office at [phone]. You can also message me directly via MyChart.   Sincerely,  Smitty Cords, MD

## 2018-06-14 NOTE — Addendum Note (Signed)
Addended by: Dorna Bloom on: 06/14/2018 04:47 PM   Modules accepted: Orders

## 2018-06-14 NOTE — Progress Notes (Signed)
Subjective:    Patient ID: [name] [name] , female   DOB: 30-Sep-1946 , 72 y.o..   MRN: [id number]  HPI  [name] [name] is here for  Chief Complaint  Patient presents with  . Shoulder Pain    left    1. LEFT shoulder pain: Has chronic left shoulder pain. Takes Tramadol and Flexeril PRN. No history of shoulder trauma.  Patient notes that she was offered a steroid shoulder injection at her last visit with Dr. Dallas Schimke but she was scared of needles and declined at that time.  She notes that the pain is been aching and getting progressively worse and she would like to have an injection at this time.  She notes some tingling down her left arm but no numbness or weakness.  Review of Systems: Per HPI.   Past Medical History: Patient Active Problem List   Diagnosis Date Noted  . Pulmonary nodules 01/04/2018  . Mass of left lower leg 01/04/2018  . Chronic kidney disease (CKD), stage III (moderate) ([place]) 06/04/2016  . Shoulder pain, left 04/06/2016  . Chronic back pain 10/02/2015  . Acute right hip pain 03/13/2012  . Episodic tension type headache 09/24/2010  . GAIT IMBALANCE 12/10/2009  . DM (diabetes mellitus), type 2 with peripheral vascular complications ([place]) 57/84/6962  . Chronic lymphocytic leukemia ([place]) 04/11/2009  . PERIPHERAL VASCULAR DISEASE 10/13/2007  . HYPERCHOLESTEROLEMIA 02/23/2007  . ANXIETY 02/23/2007  . TOBACCO DEPENDENCE 02/23/2007  . HYPERTENSION, BENIGN SYSTEMIC 02/23/2007  . COPD 02/23/2007  . GASTROESOPHAGEAL REFLUX, NO ESOPHAGITIS 02/23/2007    Medications: reviewed   Social Hx:  reports that she has been smoking cigarettes.  She started smoking about 42 years ago. She has a 3.00 pack-year smoking history. She has never used smokeless tobacco.   Objective:   BP (!) 150/74   Pulse 68   Temp 98.4 F (36.9 C) (Oral)   Ht 5\' 3"  (1.6 m)   Wt 129 lb 9.6 oz (58.8 kg)   SpO2 99%   BMI 22.96 kg/m  Physical Exam  Gen: NAD, alert, cooperative with exam,  well-appearing Left Shoulder: Inspection reveals no abnormalities, atrophy or asymmetry. Palpation with tenderness over AC joint or bicipital groove. ROM is limited with overhead movement, cannot go past ~130 degrees Rotator cuff strength normal throughout. Positive Hawkin's tests and empty can sign. Normal scapular function observed. No painful arc and no drop arm sign. No apprehension sign  INJECTION: Patient was given informed consent, signed copy in the chart. Appropriate time out was taken. Area prepped and draped in usual sterile fashion. 4 cc of methylprednisolone 40 mg/ml plus  1 cc of 1% lidocaine without epinephrine was injected into the left subacromial space using a(n) horizontal approach. The patient tolerated the procedure well. There were no complications. Post procedure instructions were given.   Assessment & Plan:   1. Rotator cuff syndrome of left shoulder: Patient has a difficult exam as her exam findings are nonspecific.  She has tenderness throughout her shoulder, limited range of motion past about 130 degrees overhead, positive Hawkins and empty can sign.  No trauma to suggest fracture.  She was offered a steroid injection today and she accepted - Steroid injection today - Continue to take Flexeril and tramadol as prescribed -Can get another steroid injection in 3 months - Discussed if she has persistent symptoms that we could refer her to sports medicine, she would like to hold off at this time  Smitty Cords, [place]  [facility], PGY-3

## 2018-06-27 ENCOUNTER — Ambulatory Visit: Payer: Medicare Other | Admitting: Physical Therapy

## 2018-06-28 ENCOUNTER — Other Ambulatory Visit: Payer: Self-pay

## 2018-06-28 ENCOUNTER — Ambulatory Visit: Payer: Medicare Other | Attending: Family Medicine | Admitting: Physical Therapy

## 2018-06-28 ENCOUNTER — Encounter: Payer: Self-pay | Admitting: Physical Therapy

## 2018-06-28 DIAGNOSIS — M6281 Muscle weakness (generalized): Secondary | ICD-10-CM

## 2018-06-28 DIAGNOSIS — R2689 Other abnormalities of gait and mobility: Secondary | ICD-10-CM

## 2018-06-28 DIAGNOSIS — M25512 Pain in left shoulder: Secondary | ICD-10-CM | POA: Diagnosis not present

## 2018-06-28 DIAGNOSIS — G8929 Other chronic pain: Secondary | ICD-10-CM

## 2018-06-28 NOTE — Therapy (Signed)
[place], [place], [address] Phone: [phone]   Fax:  [phone]  Physical Therapy Evaluation  Patient Details  Name: [name] [name] MRN: [id number] Date of Birth: 12-01-1946 Referring Provider: Zenia Resides, MD   Encounter Date: 06/28/2018  PT End of Session - 06/28/18 1331    Visit Number  1    Number of Visits  13    Date for PT Re-Evaluation  08/09/18    Authorization Type  MCR: KX mod by 15th visit progress note by 10th visit.     PT Start Time  1331    PT Stop Time  1418    PT Time Calculation (min)  47 min    Activity Tolerance  Patient tolerated treatment well    Behavior During Therapy  WFL for tasks assessed/performed       Past Medical History:  Diagnosis Date  . Allergy    environmental  . Asthma   . CLL (chronic lymphoblastic leukemia)   . Constipation 11/15/2011  . COPD (chronic obstructive pulmonary disease) ([place])   . Diabetes mellitus without complication ([place])   . GERD (gastroesophageal reflux disease)   . Hyperlipidemia   . Hypertension   . PVD (peripheral vascular disease) ([place])   . Stroke [facility])    in 1998 due to tumor     Past Surgical History:  Procedure Laterality Date  . ABI  02/2012   ABI <0.65 BL 02/2012  . BRAIN MENINGIOMA EXCISION    . Bypass grafting of RLE for PAD claudication     . CESAREAN SECTION     1974, 77 ,79  . CHOLECYSTECTOMY, LAPAROSCOPIC    . ESOPHAGEAL DILATION      There were no vitals filed for this visit.   Subjective Assessment - 06/28/18 1336    Subjective  pt is a 72 y.o F with CC of L shoulder pain that started about 2 months ago with no specific onset. difficult gripping and holding in the L hand,  pain stays mostly in the top of the shoulder and radiates to the elbow. she states she did get an injection with minimal improvement but then the pain returned. pt denies N/T. pt reports increased pain at night.  pt reports no hx of L  shoulder problems before this issue. since onset the pain has stayed about the same fluctuating on the activity. pt reports having a hx of instability reporting hx of falls 3 within the last month.    Pertinent History  hx or cx, and hx of brain surgery with tumor removal     Limitations  Walking;Standing;Lifting    How long can you sit comfortably?  unlimited    How long can you stand comfortably?  5-10 min     How long can you walk comfortably?  with rollator 20 min     Diagnostic tests  pt is unsure    Patient Stated Goals  to be in no pain, to not fall, walk without and AD, use the L arm without issues.     Currently in Pain?  Yes    Pain Score  4     Pain Location  Shoulder    Pain Orientation  Left;Lateral    Pain Descriptors / Indicators  Nagging    Pain Type  Chronic pain    Pain Radiating Towards  shoots to the elbow     Pain Onset  More than a month ago  Pain Frequency  Constant    Aggravating Factors   lifting with the shoulder, holding items in the L hand     Pain Relieving Factors  resting, ice/ heat         [facility] PT Assessment - 06/28/18 1327      Assessment   Medical Diagnosis  Gait instability. Left shoulder rotator cuff syndrome    Referring Provider  Zenia Resides, MD    Hand Dominance  Right    Prior Therapy  yes      Balance Screen   Has the patient fallen in the past 6 months  Yes    How many times?  3    Has the patient had a decrease in activity level because of a fear of falling?   Yes    Is the patient reluctant to leave their home because of a fear of falling?   No      Home Environment   Living Environment  Other (Comment) living in hotel staing in first floor    Living Arrangements  Spouse/significant other    Available Help at [facility]  -- rollator      Prior Function   Level of Independence  Independent with basic ADLs    Vocation  Retired      Associate Professor   Overall Cognitive Status  Within Functional Limits  for tasks assessed      Observation/Other Assessments   Focus on Therapeutic Outcomes (FOTO)   53% limited predicted 40% limited      Posture/Postural Control   Posture/Postural Control  Postural limitations    Postural Limitations  Rounded Shoulders;Forward head      ROM / Strength   AROM / PROM / Strength  AROM;Strength;PROM      AROM   AROM Assessment Site  Shoulder    Right/Left Shoulder  Right;Left    Right Shoulder Extension  77 Degrees    Right Shoulder Flexion  148 Degrees    Right Shoulder ABduction  158 Degrees    Right Shoulder Internal Rotation  46 Degrees    Right Shoulder External Rotation  79 Degrees    Left Shoulder Extension  55 Degrees    Left Shoulder Flexion  131 Degrees    Left Shoulder ABduction  127 Degrees    Left Shoulder Internal Rotation  42 Degrees    Left Shoulder External Rotation  76 Degrees      PROM   PROM Assessment Site  Shoulder    Right/Left Shoulder  Left      Strength   Strength Assessment Site  Shoulder;Hand    Right/Left Shoulder  Right;Left    Right Shoulder Flexion  4-/5    Right Shoulder Extension  4/5    Right Shoulder ABduction  4-/5    Right Shoulder Internal Rotation  4/5    Right Shoulder External Rotation  4/5    Left Shoulder Flexion  4-/5 painful    Left Shoulder Extension  4/5    Left Shoulder ABduction  4-/5 painful    Left Shoulder Internal Rotation  4/5    Left Shoulder External Rotation  3+/5 painful    Right Hand Grip (lbs)  30 25,29,36    Left Hand Grip (lbs)  34.3 29,29,45      Palpation   Palpation comment  TTP along the infraspinatus and teres minor, greater tubercle, upper trap tightness with multiple trigger points,  Special Tests    Special Tests  Rotator Cuff Impingement    Rotator Cuff Impingment tests  Luan Pulling- Kennedy test;Painful Arc of Motion;other;Full Can test;Empty Can test      Hawkins-Kennedy test   Findings  Positive      Empty Can test   Findings  Unable to test      Full Can  test   Findings  Unable to test      Painful Arc of Motion   Findings  Positive      other   Findings  Negative    Comments  -- scapular assist      Ambulation/Gait   Ambulation/Gait  Yes    Assistive device  Rollator    Gait Pattern  Step-through pattern;Antalgic;Trendelenburg                Objective measurements completed on examination: See above findings.              PT Education - 06/28/18 1340    Education Details  evaluation findings, POC, goals, HEP with proper form/ rationale,     Person(s) Educated  Patient    Methods  Explanation;Verbal cues    Comprehension  Verbalized understanding;Verbal cues required       PT Short Term Goals - 06/28/18 1434      PT SHORT TERM GOAL #1   Title  pt to be I with inital HEP    Time  3    Period  Weeks    Status  New    Target Date  07/19/18      PT SHORT TERM GOAL #2   Title  pt to verbalize and demonstrate proper posture in multiple positions and lifting mechaincs to prevent and reduce shoulder pain     Time  3    Period  Weeks    Status  New    Target Date  07/19/18        PT Long Term Goals - 06/28/18 1435      PT LONG TERM GOAL #1   Title  to maintain and promote shoulder functional mobility with report of </= 2/10 pain for maintainance and QOL    Time  6    Period  Weeks    Status  New    Target Date  08/09/18      PT LONG TERM GOAL #2   Title  increase shoulder strength to >/= 4/5 in all planes to promote lifting and carrying mechanics to promote functional lifting and carrying activities.     Time  6    Period  Weeks    Status  New    Target Date  08/09/18      PT LONG TERM GOAL #3   Title  pt to be able to lift and lower >/= 8 # to and from an overhead shelf for functional strength required for ADLs reporting </= 2/10 pain    Time  6    Period  Weeks    Status  New    Target Date  08/09/18      PT LONG TERM GOAL #4   Title  increase FOTO score to </= 40% limited to demo  improvement in function     Time  6    Period  Weeks    Status  New    Target Date  08/09/18      PT LONG TERM GOAL #5   Title  pt to be I with all  HEP given as of last visit to maintain and progress current level of function     Time  6    Period  Weeks    Status  New    Target Date  08/09/18             Plan - 06/28/18 1424    Clinical Impression Statement  pt presents to OPPT with CC of L shoulder pain starting insidously 2 months ago and reports hx of 3 falls within the last month. Significant limitation with shoulder mobility and strength secondary to pain and stiffness. special testing and palpation is positive for confirming MD dx of rotator cuff pathology. pt would benefit from physical therapy to improve shoulder mobility, increase strength and funciton. promote stability and balance and maximize safety and function by addressing the deficits listed.     History and Personal Factors relevant to plan of care:  hx of cx, brain tumor, living in a hotel room     Clinical Presentation  Evolving    Clinical Presentation due to:  shoulder pain, limited mobility, weakness, gait abnormality    Clinical Decision Making  Moderate    Rehab Potential  Good    PT Frequency  2x / week    PT Duration  6 weeks    PT Treatment/Interventions  ADLs/Self Care Home Management;Cryotherapy;Gait training;Therapeutic activities;Therapeutic exercise;Manual techniques;Dry needling;Taping;Patient/family education;Balance training    PT Next Visit Plan  (no E-stim or heat hx of cx) BERG balance shoulder STW, shoulder strength, review/ update HEP    PT Home Exercise Plan  towel gripping, scapular retraction, upper trap stretch.    Consulted and Agree with Plan of Care  Patient;Family member/caregiver    Family Member Consulted  husband       Patient will benefit from skilled therapeutic intervention in order to improve the following deficits and impairments:  Pain, Improper body mechanics, Postural  dysfunction, Abnormal gait, Impaired UE functional use, Increased fascial restricitons, Decreased strength, Decreased endurance, Decreased activity tolerance, Decreased range of motion, Decreased balance, Decreased safety awareness  Visit Diagnosis: Chronic left shoulder pain  Other abnormalities of gait and mobility  Muscle weakness (generalized)     Problem List Patient Active Problem List   Diagnosis Date Noted  . Pulmonary nodules 01/04/2018  . Mass of left lower leg 01/04/2018  . Chronic kidney disease (CKD), stage III (moderate) ([place]) 06/04/2016  . Shoulder pain, left 04/06/2016  . Chronic back pain 10/02/2015  . Acute right hip pain 03/13/2012  . Episodic tension type headache 09/24/2010  . GAIT IMBALANCE 12/10/2009  . DM (diabetes mellitus), type 2 with peripheral vascular complications ([place]) 31/54/0086  . Chronic lymphocytic leukemia ([place]) 04/11/2009  . PERIPHERAL VASCULAR DISEASE 10/13/2007  . HYPERCHOLESTEROLEMIA 02/23/2007  . ANXIETY 02/23/2007  . TOBACCO DEPENDENCE 02/23/2007  . HYPERTENSION, BENIGN SYSTEMIC 02/23/2007  . COPD 02/23/2007  . GASTROESOPHAGEAL REFLUX, NO ESOPHAGITIS 02/23/2007   Starr Lake PT, DPT, LAT, ATC  06/28/18  2:39 PM      [facility] [address] [place], [place], [address] Phone: [phone]   Fax:  [phone]  Name: [name] [name] MRN: [id number] Date of Birth: 1946/10/14

## 2018-07-05 ENCOUNTER — Ambulatory Visit: Payer: Medicare Other | Admitting: Physical Therapy

## 2018-07-14 ENCOUNTER — Encounter: Payer: Self-pay | Admitting: Physical Therapy

## 2018-07-14 ENCOUNTER — Ambulatory Visit: Payer: Medicare Other | Admitting: Physical Therapy

## 2018-07-14 DIAGNOSIS — M25512 Pain in left shoulder: Principal | ICD-10-CM

## 2018-07-14 DIAGNOSIS — M6281 Muscle weakness (generalized): Secondary | ICD-10-CM

## 2018-07-14 DIAGNOSIS — G8929 Other chronic pain: Secondary | ICD-10-CM | POA: Diagnosis not present

## 2018-07-14 DIAGNOSIS — R2689 Other abnormalities of gait and mobility: Secondary | ICD-10-CM | POA: Diagnosis not present

## 2018-07-14 NOTE — Therapy (Addendum)
[place], [place], [address] Phone: [phone]   Fax:  [phone]  Physical Therapy Treatment / Discharge Summary  Patient Details  Name: [name] [name] MRN: [id number] Date of Birth: [birth year] Referring Provider: Zenia Resides, MD   Encounter Date: 07/14/2018  PT End of Session - 07/14/18 1023    Visit Number  2    Number of Visits  13    Date for PT Re-Evaluation  08/09/18    Authorization Type  MCR: KX mod by 15th visit progress note by 10th visit.     PT Start Time  [phone]    PT Stop Time  1015    PT Time Calculation (min)  42 min    Equipment Utilized During Treatment  Gait belt    Activity Tolerance  Patient tolerated treatment well    Behavior During Therapy  WFL for tasks assessed/performed       Past Medical History:  Diagnosis Date  . Allergy    environmental  . Asthma   . CLL (chronic lymphoblastic leukemia)   . Constipation 11/15/2011  . COPD (chronic obstructive pulmonary disease) ([place])   . Diabetes mellitus without complication ([place])   . GERD (gastroesophageal reflux disease)   . Hyperlipidemia   . Hypertension   . PVD (peripheral vascular disease) ([place])   . Stroke [facility])    in 1998 due to tumor     Past Surgical History:  Procedure Laterality Date  . ABI  02/2012   ABI <0.65 BL 02/2012  . BRAIN MENINGIOMA EXCISION    . Bypass grafting of RLE for PAD claudication     . CESAREAN SECTION     1974, 77 ,79  . CHOLECYSTECTOMY, LAPAROSCOPIC    . ESOPHAGEAL DILATION      There were no vitals filed for this visit.  Subjective Assessment - 07/14/18 0935    Subjective  "I feel a little bit better than what I've been feeling. Tylenol has been helping but it still hurts at night."    Currently in Pain?  Yes    Pain Score  8     Pain Location  Shoulder    Pain Orientation  Left;Posterior    Pain Descriptors / Indicators  Nagging miserable    Pain Type  Chronic pain    Pain  Onset  More than a month ago    Pain Frequency  Intermittent         [facility] PT Assessment - 07/14/18 0001      Berg Balance Test   Sit to Stand  Able to stand  independently using hands    Standing Unsupported  Able to stand safely 2 minutes pt reporting increase pain in R leg    Sitting with Back Unsupported but Feet Supported on Floor or Stool  Able to sit safely and securely 2 minutes    Stand to Sit  Controls descent by using hands    Transfers  Able to transfer safely, definite need of hands    Standing Unsupported with Eyes Closed  Able to stand 10 seconds safely    Standing Ubsupported with Feet Together  Able to place feet together independently and stand for 1 minute with supervision pt reports increasing pain in R leg while standing    From Standing, Reach Forward with Outstretched Arm  Can reach forward >12 cm safely (5")    From Standing Position, Pick up Object from Floor  Able to pick  up shoe, needs supervision    From Standing Position, Turn to Look Behind Over each Shoulder  Looks behind one side only/other side shows less weight shift    Turn 360 Degrees  Needs close supervision or verbal cueing    Standing Unsupported, Alternately Place Feet on Step/Stool  Needs assistance to keep from falling or unable to try    Standing Unsupported, One Foot in [place] to place foot tandem independently and hold 30 seconds    Standing on One Leg  Tries to lift leg/unable to hold 3 seconds but remains standing independently    Total Score  39                   [facility] Adult PT Treatment/Exercise - 07/14/18 0001      Shoulder Exercises: Seated   Retraction  AROM;Strengthening;Both;10 reps tactile cues needed    Other Seated Exercises  towel grip 10 x 5 seconds      Shoulder Exercises: Standing   ABduction  AAROM;Left;10 reps with cane. Cues needed to keep elbow straight      Shoulder Exercises: Stretch   Other Shoulder Stretches  upper trap stretch 2 x 30 seconds  left pt needs cues to not over stretch             PT Education - 07/14/18 1022    Education Details  BERG balance assessment and fall risk category (significant fall risk), benefits of continued use of AD and to avoid quick, rushed movements; HEP review of upper trap stretch and to avoid over stretching, scap retractions, and towel squeeze; added AAROM cane abduction to HEP    Person(s) Educated  Patient    Methods  Explanation;Handout    Comprehension  Verbalized understanding;Verbal cues required       PT Short Term Goals - 06/28/18 1434      PT SHORT TERM GOAL #1   Title  pt to be I with inital HEP    Time  3    Period  Weeks    Status  New    Target Date  07/19/18      PT SHORT TERM GOAL #2   Title  pt to verbalize and demonstrate proper posture in multiple positions and lifting mechaincs to prevent and reduce shoulder pain     Time  3    Period  Weeks    Status  New    Target Date  07/19/18        PT Long Term Goals - 07/14/18 1204      PT LONG TERM GOAL #1   Title  to maintain and promote shoulder functional mobility with report of </= 2/10 pain for maintainance and QOL    Time  6    Period  Weeks    Status  New    Target Date  08/09/18      PT LONG TERM GOAL #2   Title  increase shoulder strength to >/= 4/5 in all planes to promote lifting and carrying mechanics to promote functional lifting and carrying activities.     Time  6    Period  Weeks    Status  New    Target Date  08/09/18      PT LONG TERM GOAL #3   Title  pt to be able to lift and lower >/= 8 # to and from an overhead shelf for functional strength required for ADLs reporting </= 2/10 pain    Time  6    Period  Weeks    Status  New    Target Date  08/09/18      PT LONG TERM GOAL #4   Title  increase FOTO score to </= 40% limited to demo improvement in function     Time  6    Period  Weeks    Status  New    Target Date  08/09/18      PT LONG TERM GOAL #5   Title  pt to be I with  all HEP given as of last visit to maintain and progress current level of function     Time  6    Period  Weeks    Status  New    Target Date  08/09/18      Additional Long Term Goals   Additional Long Term Goals  Yes      PT LONG TERM GOAL #6   Title  Pt will improve Berg balance score to >/= 46 to decrease fall risk and improve safety.     Time  4    Period  Weeks    Status  New    Target Date  08/09/18            Plan - 07/14/18 1023    Clinical Impression Statement  Berg balance assessed today; pt scored 39 placing her in significant fall risk category. Treatment focused on in-depth review and update of HEP as pt reports she lost her paper and has not been performing her exercises. She was provided with a new copy of her HEP with cane abduction AAROM added and encouraged to complete her exercises twice a day. Pt reports decreased pain at end of session. Pt also states she will only be able to come 2x/wk due to co-pay price. She has already cancelled 1 visit per week to accomodate for this.     PT Treatment/Interventions  ADLs/Self Care Home Management;Cryotherapy;Gait training;Therapeutic activities;Therapeutic exercise;Manual techniques;Dry needling;Taping;Patient/family education;Balance training    PT Next Visit Plan  (no E-stim or heat hx of cx) shoulder STW, shoulder strength, ROM, review/ update HEP    PT Home Exercise Plan  towel gripping, scapular retraction, upper trap stretch, AAROM abduction with cane    Consulted and Agree with Plan of Care  Patient;Family member/caregiver    Family Member Consulted  husband       Patient will benefit from skilled therapeutic intervention in order to improve the following deficits and impairments:  Pain, Improper body mechanics, Postural dysfunction, Abnormal gait, Impaired UE functional use, Increased fascial restricitons, Decreased strength, Decreased endurance, Decreased activity tolerance, Decreased range of motion, Decreased  balance, Decreased safety awareness  Visit Diagnosis: Chronic left shoulder pain  Other abnormalities of gait and mobility  Muscle weakness (generalized)     Problem List Patient Active Problem List   Diagnosis Date Noted  . Pulmonary nodules 01/04/2018  . Mass of left lower leg 01/04/2018  . Chronic kidney disease (CKD), stage III (moderate) ([place]) 06/04/2016  . Shoulder pain, left 04/06/2016  . Chronic back pain 10/02/2015  . Acute right hip pain 03/13/2012  . Episodic tension type headache 09/24/2010  . GAIT IMBALANCE 12/10/2009  . DM (diabetes mellitus), type 2 with peripheral vascular complications ([place]) 55/73/2202  . Chronic lymphocytic leukemia ([place]) 04/11/2009  . PERIPHERAL VASCULAR DISEASE 10/13/2007  . HYPERCHOLESTEROLEMIA 02/23/2007  . ANXIETY 02/23/2007  . TOBACCO DEPENDENCE 02/23/2007  . HYPERTENSION, BENIGN SYSTEMIC 02/23/2007  . COPD 02/23/2007  . GASTROESOPHAGEAL  REFLUX, NO ESOPHAGITIS 02/23/2007   Worthy Flank, SPT 07/14/18 12:13 PM   [facility] [address] [place], [place], [address] Phone: [phone]   Fax:  [phone]  Name: [name] [name] MRN: [id number] Date of Birth: July 30, 1946       PHYSICAL THERAPY DISCHARGE SUMMARY  Visits from Start of Care: 2  Current functional level related to goals / functional outcomes: See goals   Remaining deficits: unknown   Education / Equipment: HEP, theraband, posture  Plan: Patient agrees to discharge.  Patient goals were not met. Patient is being discharged due to not returning since the last visit.  ?????         Kristoffer Leamon PT, DPT, LAT, ATC  08/23/18  11:29 AM

## 2018-07-18 ENCOUNTER — Ambulatory Visit: Payer: Medicare Other | Admitting: Physical Therapy

## 2018-07-18 ENCOUNTER — Other Ambulatory Visit: Payer: Self-pay | Admitting: Internal Medicine

## 2018-07-20 ENCOUNTER — Ambulatory Visit: Payer: Medicare Other | Admitting: Physical Therapy

## 2018-07-25 ENCOUNTER — Ambulatory Visit: Payer: Medicare Other | Admitting: Physical Therapy

## 2018-07-27 ENCOUNTER — Encounter: Payer: Medicare Other | Admitting: Physical Therapy

## 2018-07-28 ENCOUNTER — Other Ambulatory Visit: Payer: Self-pay | Admitting: Internal Medicine

## 2018-07-28 NOTE — Progress Notes (Signed)
HEMATOLOGY/ONCOLOGY CLINIC NOTE  Date of Service: 07/31/18    Patient Care Team: [name], [place], DO as PCP - General (Family Medicine) [name], [name] Gibson, RN as [facility]  CHIEF COMPLAINTS/PURPOSE OF CONSULTATION:  F/u for CLL  HISTORY OF PRESENTING ILLNESS:   [name] [name] is a wonderful 72 y.o. female smoker with history of CLL who has been referred to [place] by Dr [name] [name], [place], DO at [facility] for evaluation and management of elevated WBC/lymphocytes.   She is accompanied by her husband. She reported to ED on 11/27/2017 with cough and abdominal pain with COPD exacerbation. Her labs on this day showed WBC elevated at 62.5. She has not previously seen a hematologist. She reports a plan to quit smoking on the first day of the new year. She reports worsening decrease in balance and states she has a cane and walker that she uses as needed. She was taking Prednisone.  No weight loss/fevers/chills/night sweats/ palpable lumps or bumps.  On review of systems, pt reports back pain, change in balance, abdominal pain, weight loss and denies changes in BM, fever and any other accompanying symptoms.  INTERVAL HISTORY:  [name] [name] is here for management and evaluation of f her Chronic Leukocytic Leukemia. The patient's last visit with [place] was on 05/11/18. She is accompanied today by her husband. The pt reports that she is doing well overall.   The pt reports that she has fallen three times in the interim. She notes that the second time she fell she wasn't able to get up without her husband's help. She denies passing out or feeling dizzy before her falls and reports leg weakness as the cause of her falls. She notes that her knee and left shoulder have hurt some since falling. She denies any medication changes and denies taking steroids/Prednisone in the interim.   Lab results today (07/31/18) of CBC w/diff, CMP, and Reticulocytes is as follows: all values  are WNL except for WBC at 53.7k, RDW at 18.9, Lymphs abs at 47.7k, Basophils abs at 500, Glucose at 112, Creatinine at 1.15, GFR at 55. LDH 07/31/18 is pending  On review of systems, pt reports stable weight, recent falls, and denies fevers, chills, night sweats, unexpected weight loss, new fatigue, neck pain, leg swelling, and any other symptoms.   MEDICAL HISTORY:  Past Medical History:  Diagnosis Date  . Allergy    environmental  . Asthma   . CLL (chronic lymphoblastic leukemia)   . Constipation 11/15/2011  . COPD (chronic obstructive pulmonary disease) ([place])   . Diabetes mellitus without complication ([place])   . GERD (gastroesophageal reflux disease)   . Hyperlipidemia   . Hypertension   . PVD (peripheral vascular disease) ([place])   . Stroke [facility])    in 1998 due to tumor     SURGICAL HISTORY: Past Surgical History:  Procedure Laterality Date  . ABI  02/2012   ABI <0.65 BL 02/2012  . BRAIN MENINGIOMA EXCISION    . Bypass grafting of RLE for PAD claudication     . CESAREAN SECTION     1974, 78 ,79  . CHOLECYSTECTOMY, LAPAROSCOPIC    . ESOPHAGEAL DILATION      SOCIAL HISTORY: Social History   Socioeconomic History  . Marital status: Married    Spouse name: Not on file  . Number of children: 3  . Years of education: Not on file  . Highest education level: Not on file  Occupational History  .  Occupation: DISABLED    Employer: DISABLED  Social Needs  . Financial resource strain: Not on file  . Food insecurity:    Worry: Not on file    Inability: Not on file  . Transportation needs:    Medical: Not on file    Non-medical: Not on file  Tobacco Use  . Smoking status: Current Some Day Smoker    Packs/day: 0.10    Years: 30.00    Pack years: 3.00    Types: Cigarettes    Start date: 01/01/1976  . Smokeless tobacco: Never Used  Substance and Sexual Activity  . Alcohol use: No    Alcohol/week: 0.0 oz  . Drug use: No  . Sexual activity: Yes  Lifestyle  . Physical  activity:    Days per week: Not on file    Minutes per session: Not on file  . Stress: Not on file  Relationships  . Social connections:    Talks on phone: Not on file    Gets together: Not on file    Attends religious service: Not on file    Active member of club or organization: Not on file    Attends meetings of clubs or organizations: Not on file    Relationship status: Not on file  . Intimate partner violence:    Fear of current or ex partner: Not on file    Emotionally abused: Not on file    Physically abused: Not on file    Forced sexual activity: Not on file  Other Topics Concern  . Not on file  Social History Narrative   Health Care POA:    Emergency Contact: [name] [name] [phone] (c)   End of Life Plan:    Who lives with you: Lives with husband   Any pets: none   Diet: Patient lacks financial resources for much food. Pt reports eating what is available.   Exercise: Patient does not have an exercise plan.   Seatbelts: Patient reports wearing seatbelt when in vehicle.    [name] Exposure/Protection:   Hobbies: Bowling, computer games, Bingo      Has financial difficulties and transportation issues as she and her husband share transportation        FAMILY HISTORY: Family History  Problem Relation Age of Onset  . Heart disease Mother   . Asthma Mother   . Cancer Mother        uterine   . Depression Mother   . Heart attack Mother 88  . Hyperlipidemia Mother   . Hypertension Mother   . Stroke Mother   . Kidney disease Mother   . Heart attack Sister   . Stroke Sister   . Depression Sister   . Diabetes Sister   . Hyperlipidemia Sister   . Depression Sister   . Diabetes Sister   . Hyperlipidemia Sister   . HIV/AIDS Sister   . Diabetes Brother   . Asthma Brother   . Hypertension Brother   . Cancer Maternal Aunt        lung  . Heart disease Maternal Grandmother   . Heart attack Maternal Grandmother   . Cancer Brother        colon  . HIV/AIDS Brother      ALLERGIES:  is allergic to aspirin and baclofen.  MEDICATIONS:  Current Outpatient Medications  Medication Sig Dispense Refill  . albuterol (PROAIR HFA) 108 (90 Base) MCG/ACT inhaler Inhale 2 puffs into the lungs every 6 (six) hours as needed for wheezing  or shortness of breath. 18 g 0  . Blood Glucose Monitoring Suppl (ONETOUCH VERIO) w/Device KIT 1 Device by Does not apply route daily. 1 kit 0  . cilostazol (PLETAL) 50 MG tablet Take 1 tablet (50 mg total) by mouth 2 (two) times daily. 180 tablet 0  . cyclobenzaprine (FLEXERIL) 10 MG tablet TAKE ONE TABLET BY MOUTH DAILY 30 tablet 3  . DULoxetine (CYMBALTA) 30 MG capsule TAKE 1 CAPSULE BY MOUTH  DAILY 90 capsule 1  . enalapril (VASOTEC) 20 MG tablet Take 1 tablet (20 mg total) by mouth daily. 90 tablet 3  . famotidine (PEPCID) 20 MG tablet Take 1 tablet (20 mg total) by mouth daily. 90 tablet 3  . Fluticasone-Salmeterol (ADVAIR DISKUS) 250-50 MCG/DOSE AEPB Inhale 1 puff into the lungs 2 (two) times daily. 1 each 3  . glucose blood (ONETOUCH VERIO) test strip Use to check blood sugar once a day 100 each 12  . hydrochlorothiazide (MICROZIDE) 12.5 MG capsule Take 1 capsule (12.5 mg total) by mouth daily. 90 capsule 3  . Lancets (ONETOUCH ULTRASOFT) lancets Use to check blood sugar once a day 100 each 12  . loratadine (CLARITIN) 10 MG tablet Take 1 tablet (10 mg total) by mouth daily. 90 tablet 2  . metFORMIN (GLUCOPHAGE) 500 MG tablet TAKE 1 TABLET BY MOUTH  TWICE A DAY WITH A MEAL 180 tablet 2  . metoprolol tartrate (LOPRESSOR) 25 MG tablet Take 0.5 tablets (12.5 mg total) by mouth 2 (two) times daily. 90 tablet 2  . Misc. Devices (ROLLER WALKER) MISC 1 rolling walker with seat 1 each 0  . montelukast (SINGULAIR) 10 MG tablet Take 1 tablet (10 mg total) by mouth at bedtime. 90 tablet 3  . nitroGLYCERIN (NITROSTAT) 0.4 MG SL tablet DISSOLVE ONE TABLET UNDER THE TONGUE EVERY 5 MINUTES, UP TO 3 DOSES FOR CHEST PAIN 30 tablet 1  . simvastatin  (ZOCOR) 20 MG tablet Take 1 tablet (20 mg total) by mouth at bedtime. 90 tablet 3  . traMADol (ULTRAM) 50 MG tablet Take 1 tablet (50 mg total) by mouth daily as needed for moderate pain. 30 tablet 0   No current facility-administered medications for this visit.     REVIEW OF SYSTEMS:   A 10+ POINT REVIEW OF SYSTEMS WAS OBTAINED including neurology, dermatology, psychiatry, cardiac, respiratory, lymph, extremities, GI, GU, Musculoskeletal, constitutional, breasts, reproductive, HEENT.  All pertinent positives are noted in the HPI.  All others are negative.    PHYSICAL EXAMINATION: ECOG PERFORMANCE STATUS: 1 - Symptomatic but completely ambulatory  . Vitals:   07/31/18 1040  BP: (!) 185/81  Pulse: (!) 58  Resp: 18  Temp: 98.4 F (36.9 C)  SpO2: 100%   Filed Weights   07/31/18 1040  Weight: 128 lb 4.8 oz (58.2 kg)   .Body mass index is 22.73 kg/m.  GENERAL:alert, in no acute distress and comfortable SKIN: no acute rashes, no significant lesions EYES: conjunctiva are pink and non-injected, sclera anicteric OROPHARYNX: MMM, no exudates, no oropharyngeal erythema or ulceration NECK: supple, no JVD LYMPH:  no palpable lymphadenopathy in the cervical, axillary or inguinal regions LUNGS: clear to auscultation b/l with normal respiratory effort HEART: regular rate & rhythm ABDOMEN:  normoactive bowel sounds , non tender, not distended. No palpable hepatosplenomegaly.  Extremity: no pedal edema PSYCH: alert & oriented x 3 with fluent speech NEURO: no focal motor/sensory deficits   LABORATORY DATA:  I have reviewed the data as listed  . CBC Latest Ref Rng &  Units 07/31/2018 05/11/2018 02/15/2018  WBC 3.9 - 10.3 K/uL 53.7(HH) 89.9(HH) 56.8(HH)  Hemoglobin 11.6 - 15.9 g/dL 11.7 11.3(L) 11.4(L)  Hematocrit 34.8 - 46.6 % 37.0 36.7 37.2  Platelets 145 - 400 K/uL 234 262 257    . CMP Latest Ref Rng & Units 07/31/2018 05/11/2018 04/26/2018  Glucose 70 - 99 mg/dL 112(H) 171(H) 120(H)    BUN 8 - 23 mg/dL '12 18 8  ' Creatinine 0.44 - 1.00 mg/dL 1.15(H) 1.35(H) 1.19(H)  Sodium 135 - 145 mmol/L 143 140 145(H)  Potassium 3.5 - 5.1 mmol/L 4.2 4.3 5.0  Chloride 98 - 111 mmol/L 106 105 102  CO2 22 - 32 mmol/L '27 29 27  ' Calcium 8.9 - 10.3 mg/dL 9.8 8.7 9.6  Total Protein 6.5 - 8.1 g/dL 6.7 5.9(L) -  Total Bilirubin 0.3 - 1.2 mg/dL 0.3 0.2 -  Alkaline Phos 38 - 126 U/L 87 89 -  AST 15 - 41 U/L 16 17 -  ALT 0 - 44 U/L 11 16 -       . Lab Results  Component Value Date   LDH 220 (H) 07/31/2018    RADIOGRAPHIC STUDIES: I have personally reviewed the radiological images as listed and agreed with the findings in the report. No results found. Surgical Pathology 12/13/2017   ASSESSMENT & PLAN:   [name] [name] is a wonderful 72 y.o. female with    1. Rai 1 Chronic Lymphocytic Leukemia  - Trisomy 12 mutation. No associated thrombocytopenia.  Mild anemia - likely from acute blood loss from significant epistaxis. hgb stable at 11.7 No constitutional symptoms. Minimal LNaednopathy No splenomegaly  Plan:  -Pt notes chronic hot flashes but not specifically drenching night sweats -Advised that pt follow up with PCP and dentist regarding lower lip sore that tooth is impinging upon.  -Counseled pt in smoking cessation for 5 minutes, she notes that nothing will help her stop smoking at this time. -again counseled on important symptoms to monitor for  -Discussed pt labwork today, 07/31/18; WBC have decreased in the last two months, from 89.9k to 53.7k and Lymphocytes have decreased from 76.1k to 47.7k. Blood chemistries are normal.  -Will continue to monitor the patient's labs and clinically follow her as well -Recommend the pt continue using her walker for balance due to concerns with neuropathy. -Recommend discussing strength and balance optimization with her PCP and consider a PT referral -Will see the pt back in 4 months unless she develops any new concerns before then -The  pt shows no clinical or lab progression of her CLL at this time.  -No indication for initiating active treatment at this time.    #2 .Lung nodules on CT. -given smoking hx will need to monitor -rpt CT chest in 3 months  #3 Patient Active Problem List   Diagnosis Date Noted  . Pulmonary nodules 01/04/2018  . Mass of left lower leg 01/04/2018  . Chronic kidney disease (CKD), stage III (moderate) ([place]) 06/04/2016  . Shoulder pain, left 04/06/2016  . Chronic back pain 10/02/2015  . Acute right hip pain 03/13/2012  . Episodic tension type headache 09/24/2010  . GAIT IMBALANCE 12/10/2009  . DM (diabetes mellitus), type 2 with peripheral vascular complications ([place]) 97/94/8016  . Chronic lymphocytic leukemia ([place]) 04/11/2009  . PERIPHERAL VASCULAR DISEASE 10/13/2007  . HYPERCHOLESTEROLEMIA 02/23/2007  . ANXIETY 02/23/2007  . TOBACCO DEPENDENCE 02/23/2007  . HYPERTENSION, BENIGN SYSTEMIC 02/23/2007  . COPD 02/23/2007  . GASTROESOPHAGEAL REFLUX, NO ESOPHAGITIS 02/23/2007   Mx per  PCP   RTC with Dr Irene Limbo in 4 months with labs   All of the patients questions were answered with apparent satisfaction. The patient knows to call the clinic with any problems, questions or concerns.  The total time spent in the appt was 20 minutes and more than 50% was on counseling and direct patient cares.     Sullivan Lone MD MS AAHIVMS [facility] Hematology/Oncology Physician [facility]  (Office):       [phone] (Work cell):  [phone] (Fax):           [phone]  I, Baldwin [place], am acting as a scribe for Dr. Irene Limbo  .I have reviewed the above documentation for accuracy and completeness, and I agree with the above. Brunetta Genera MD

## 2018-07-31 ENCOUNTER — Inpatient Hospital Stay (HOSPITAL_BASED_OUTPATIENT_CLINIC_OR_DEPARTMENT_OTHER): Payer: Medicare Other | Admitting: Hematology

## 2018-07-31 ENCOUNTER — Telehealth: Payer: Self-pay | Admitting: Hematology

## 2018-07-31 ENCOUNTER — Inpatient Hospital Stay: Payer: Medicare Other | Attending: Hematology

## 2018-07-31 VITALS — BP 185/81 | HR 58 | Temp 98.4°F | Resp 18 | Ht 63.0 in | Wt 128.3 lb

## 2018-07-31 DIAGNOSIS — N951 Menopausal and female climacteric states: Secondary | ICD-10-CM

## 2018-07-31 DIAGNOSIS — R296 Repeated falls: Secondary | ICD-10-CM | POA: Diagnosis not present

## 2018-07-31 DIAGNOSIS — R911 Solitary pulmonary nodule: Secondary | ICD-10-CM | POA: Insufficient documentation

## 2018-07-31 DIAGNOSIS — D649 Anemia, unspecified: Secondary | ICD-10-CM | POA: Insufficient documentation

## 2018-07-31 DIAGNOSIS — Z720 Tobacco use: Secondary | ICD-10-CM

## 2018-07-31 DIAGNOSIS — C9110 Chronic lymphocytic leukemia of B-cell type not having achieved remission: Secondary | ICD-10-CM

## 2018-07-31 LAB — CBC WITH DIFFERENTIAL/PLATELET
Basophils Absolute: 0.5 10*3/uL — ABNORMAL HIGH (ref 0.0–0.1)
Basophils Relative: 1 %
Eosinophils Absolute: 0.2 10*3/uL (ref 0.0–0.5)
Eosinophils Relative: 0 %
HCT: 37 % (ref 34.8–46.6)
Hemoglobin: 11.7 g/dL (ref 11.6–15.9)
Lymphocytes Relative: 89 %
Lymphs Abs: 47.7 10*3/uL — ABNORMAL HIGH (ref 0.9–3.3)
MCH: 27 pg (ref 25.1–34.0)
MCHC: 31.5 g/dL (ref 31.5–36.0)
MCV: 85.7 fL (ref 79.5–101.0)
Monocytes Absolute: 0.4 10*3/uL (ref 0.1–0.9)
Monocytes Relative: 1 %
Neutro Abs: 5 10*3/uL (ref 1.5–6.5)
Neutrophils Relative %: 9 %
Platelets: 234 10*3/uL (ref 145–400)
RBC: 4.32 MIL/uL (ref 3.70–5.45)
RDW: 18.9 % — ABNORMAL HIGH (ref 11.2–14.5)
WBC: 53.7 10*3/uL (ref 3.9–10.3)

## 2018-07-31 LAB — CMP (CANCER CENTER ONLY)
ALT: 11 U/L (ref 0–44)
AST: 16 U/L (ref 15–41)
Albumin: 3.9 g/dL (ref 3.5–5.0)
Alkaline Phosphatase: 87 U/L (ref 38–126)
Anion gap: 10 (ref 5–15)
BUN: 12 mg/dL (ref 8–23)
CO2: 27 mmol/L (ref 22–32)
Calcium: 9.8 mg/dL (ref 8.9–10.3)
Chloride: 106 mmol/L (ref 98–111)
Creatinine: 1.15 mg/dL — ABNORMAL HIGH (ref 0.44–1.00)
GFR, Est AFR Am: 55 mL/min — ABNORMAL LOW (ref >60)
GFR, Estimated: 47 mL/min — ABNORMAL LOW (ref >60)
Glucose, Bld: 112 mg/dL — ABNORMAL HIGH (ref 70–99)
Potassium: 4.2 mmol/L (ref 3.5–5.1)
Sodium: 143 mmol/L (ref 135–145)
Total Bilirubin: 0.3 mg/dL (ref 0.3–1.2)
Total Protein: 6.7 g/dL (ref 6.5–8.1)

## 2018-07-31 LAB — RETICULOCYTES
RBC.: 4.3 MIL/uL (ref 3.87–5.11)
Retic Count, Absolute: 55.9 10*3/uL (ref 19.0–186.0)
Retic Ct Pct: 1.3 % (ref 0.4–3.1)

## 2018-07-31 LAB — LACTATE DEHYDROGENASE: LDH: 220 U/L — ABNORMAL HIGH (ref 98–192)

## 2018-07-31 NOTE — Telephone Encounter (Signed)
Scheduled appt per 8/5 los - gave patient AVS and calender per los.  

## 2018-08-01 ENCOUNTER — Ambulatory Visit: Payer: Medicare Other | Admitting: Physical Therapy

## 2018-08-01 ENCOUNTER — Other Ambulatory Visit: Payer: Self-pay | Admitting: Internal Medicine

## 2018-08-03 ENCOUNTER — Encounter: Payer: Medicare Other | Admitting: Physical Therapy

## 2018-08-08 ENCOUNTER — Other Ambulatory Visit: Payer: Self-pay | Admitting: Internal Medicine

## 2018-08-08 ENCOUNTER — Ambulatory Visit: Payer: Medicare Other | Attending: Family Medicine | Admitting: Physical Therapy

## 2018-08-09 ENCOUNTER — Ambulatory Visit (HOSPITAL_COMMUNITY)
Admission: RE | Admit: 2018-08-09 | Discharge: 2018-08-09 | Disposition: A | Discharge status: Home / Self Care | Payer: Medicare Other | Source: Ambulatory Visit | Attending: Hematology | Admitting: Hematology

## 2018-08-09 DIAGNOSIS — J432 Centrilobular emphysema: Secondary | ICD-10-CM | POA: Diagnosis not present

## 2018-08-09 DIAGNOSIS — I700 Atherosclerosis of aorta: Secondary | ICD-10-CM | POA: Diagnosis not present

## 2018-08-09 DIAGNOSIS — R911 Solitary pulmonary nodule: Secondary | ICD-10-CM

## 2018-08-09 DIAGNOSIS — K228 Other specified diseases of esophagus: Secondary | ICD-10-CM | POA: Diagnosis not present

## 2018-08-09 DIAGNOSIS — C9110 Chronic lymphocytic leukemia of B-cell type not having achieved remission: Secondary | ICD-10-CM | POA: Diagnosis not present

## 2018-08-09 DIAGNOSIS — I2510 Atherosclerotic heart disease of native coronary artery without angina pectoris: Secondary | ICD-10-CM | POA: Diagnosis not present

## 2018-08-10 ENCOUNTER — Other Ambulatory Visit: Payer: Self-pay | Admitting: Family Medicine

## 2018-08-10 ENCOUNTER — Ambulatory Visit: Payer: Medicare Other | Admitting: Physical Therapy

## 2018-08-10 ENCOUNTER — Telehealth: Payer: Self-pay | Admitting: Physical Therapy

## 2018-08-10 NOTE — Telephone Encounter (Signed)
LM for patient asking her to call back and schedule an appointment to discuss her pain ok per DPR.  Miia Blanks,CMA

## 2018-08-10 NOTE — Telephone Encounter (Signed)
Patient will need to come in for another refill, a sit appears she is requiring more than prescribed.  Thanks!

## 2018-08-10 NOTE — Telephone Encounter (Signed)
Called to inform pt of missed appointment today at 3pm. Pt reports she overslept and meant to call this morning. Attempted to remind pt of attendance policy and no-show protocol (pt has no-showed twice), pt adamantly claims she called to cancel last visit (August 13th) and states she had another appointment. Reminded pt of her next appt scheduled on August 22nd at 3pm and that we need to cancel her appointment on August 20 at 3pm because she needs to see a physical therapist for reassessment.   Curtiss Mahmood PT, DPT, LAT, ATC  08/10/18  3:42 PM

## 2018-08-15 ENCOUNTER — Ambulatory Visit: Payer: Medicare Other | Admitting: Physical Therapy

## 2018-08-17 ENCOUNTER — Ambulatory Visit: Payer: Medicare Other | Admitting: Physical Therapy

## 2018-08-23 ENCOUNTER — Other Ambulatory Visit: Payer: Self-pay | Admitting: Internal Medicine

## 2018-08-25 ENCOUNTER — Other Ambulatory Visit: Payer: Self-pay

## 2018-08-27 MED ORDER — TRAMADOL HCL 50 MG PO TABS: 50.0000 mg | ORAL_TABLET | Freq: Every day | ORAL | 0 refills | Status: DC | PRN

## 2018-08-30 ENCOUNTER — Other Ambulatory Visit: Payer: Self-pay | Admitting: Family Medicine

## 2018-08-30 ENCOUNTER — Telehealth: Payer: Self-pay | Admitting: Family Medicine

## 2018-08-30 ENCOUNTER — Other Ambulatory Visit: Payer: Self-pay

## 2018-08-30 NOTE — Telephone Encounter (Signed)
[facility] called and are unable to fill this under PCP's DEA number. Asked for supervising physician to send in .  Danley Danker, RN [facility] RN)

## 2018-08-30 NOTE — Telephone Encounter (Signed)
Spoke with patient over phone. She is taking 1 tramadol per day for back pain. She has no concerns today, but does have recent falls a few months ago and she is to call and schedule a same day to be evaluated in the office.

## 2018-08-30 NOTE — Telephone Encounter (Signed)
Will forward to MD to advise. Nnenna Meador,CMA  

## 2018-08-30 NOTE — Telephone Encounter (Signed)
Pt would like Dr. Enid Derry to call her concerning her medications. She has an appointment scheduled in October but would like to speak with Dr. Enid Derry before that date.

## 2018-08-31 NOTE — Telephone Encounter (Signed)
Should be routed to the preceptor for that time.

## 2018-08-31 NOTE — Telephone Encounter (Signed)
In the mean time, please have PCP attempt to call in the prescription again while I look into the issue.

## 2018-09-26 ENCOUNTER — Other Ambulatory Visit: Payer: Self-pay | Admitting: Family Medicine

## 2018-09-26 NOTE — Telephone Encounter (Signed)
Acknowledged.

## 2018-09-29 ENCOUNTER — Other Ambulatory Visit: Payer: Self-pay

## 2018-09-29 ENCOUNTER — Ambulatory Visit (INDEPENDENT_AMBULATORY_CARE_PROVIDER_SITE_OTHER): Payer: Medicare Other | Admitting: Family Medicine

## 2018-09-29 ENCOUNTER — Encounter: Payer: Self-pay | Admitting: Family Medicine

## 2018-09-29 VITALS — BP 140/70 | HR 60 | Temp 98.0°F | Ht 63.0 in | Wt 126.0 lb

## 2018-09-29 DIAGNOSIS — I10 Essential (primary) hypertension: Secondary | ICD-10-CM

## 2018-09-29 DIAGNOSIS — Z23 Encounter for immunization: Secondary | ICD-10-CM

## 2018-09-29 DIAGNOSIS — M25512 Pain in left shoulder: Secondary | ICD-10-CM

## 2018-09-29 DIAGNOSIS — J449 Chronic obstructive pulmonary disease, unspecified: Secondary | ICD-10-CM | POA: Diagnosis not present

## 2018-09-29 DIAGNOSIS — E2839 Other primary ovarian failure: Secondary | ICD-10-CM

## 2018-09-29 DIAGNOSIS — E1151 Type 2 diabetes mellitus with diabetic peripheral angiopathy without gangrene: Secondary | ICD-10-CM | POA: Diagnosis not present

## 2018-09-29 DIAGNOSIS — Z1211 Encounter for screening for malignant neoplasm of colon: Secondary | ICD-10-CM | POA: Diagnosis not present

## 2018-09-29 DIAGNOSIS — Z1239 Encounter for other screening for malignant neoplasm of breast: Secondary | ICD-10-CM

## 2018-09-29 LAB — POCT GLYCOSYLATED HEMOGLOBIN (HGB A1C): HbA1c, POC (controlled diabetic range): 6.2 % (ref 0.0–7.0)

## 2018-09-29 MED ORDER — DICLOFENAC SODIUM 1 % TD GEL: 2.0000 g | Freq: Four times a day (QID) | TRANSDERMAL | 2 refills | Status: DC

## 2018-09-29 MED ORDER — ALBUTEROL SULFATE HFA 108 (90 BASE) MCG/ACT IN AERS: 2.0000 | INHALATION_SPRAY | Freq: Four times a day (QID) | RESPIRATORY_TRACT | 0 refills | Status: DC | PRN

## 2018-09-29 MED ORDER — FLUTICASONE-SALMETEROL 250-50 MCG/DOSE IN AEPB: 1.0000 | INHALATION_SPRAY | Freq: Two times a day (BID) | RESPIRATORY_TRACT | 3 refills | Status: DC

## 2018-09-29 MED ORDER — NICOTINE POLACRILEX 4 MG MT GUM: 4.0000 mg | CHEWING_GUM | OROMUCOSAL | 0 refills | Status: DC | PRN

## 2018-09-29 MED ORDER — ALBUTEROL SULFATE 0.63 MG/3ML IN NEBU: 1.0000 | INHALATION_SOLUTION | Freq: Four times a day (QID) | RESPIRATORY_TRACT | 12 refills | Status: DC | PRN

## 2018-09-29 NOTE — Patient Instructions (Addendum)
Thank you for coming to see me today. It was a pleasure! Today we talked about:   Please schedule an appointment with Dr. Valentina Lucks for tobacco cessation. You can call [phone] QUIT-NOW.  Please schedule an appointment with Dr. Valentina Lucks for your PFT's.   I have sent diclofenac gel to your pharmacy, apply this to your knee and shoulder 4 times daily.   Please follow-up with me in 3 months or sooner as needed.  If you have any questions or concerns, please do not hesitate to call the office at [phone].  Take Care,   [place] Zachory Mangual, DO

## 2018-09-29 NOTE — Progress Notes (Signed)
Subjective:    Patient ID: [name] [name], female    DOB: [birth year], 72 y.o.   MRN: [id number]   CC:   HPI:  Chronic Back and Shoulder Pain: - Patient takes one tramadol at night before bed to help her be able to sleep. She also takes flexeril which she reports helps her tremendously. She knows that these are not the best options for her age but she is allergic to baclofen and cannot function without help.  - Patient reports that her pain has been stable to for a while. She has previously been given a gel to help her, but it was too expensive at the time.   Hypertension: - Medications: enalapril, HCTZ, and lopressor - Compliance: patient reports she takes her medication most days - Checking BP at home: sometimes - Denies any SOB, CP, vision changes, LE edema, medication SEs, or symptoms of hypotension - Diet: eats whatever she likes, not really trying to have low sodium diet, does try to eat less sugary things for DM - Exercise: unable to exercise a lot due to pain  Diabetes:  Last A1c 6.5 on 04/26/18 Taking medications: metformin 500mg  BID Reports allergy to Aspirin, and on statin Last eye exam: encouraged to follow up Last foot exam: up to date ROS: denies dizziness, diaphoresis, LOC, polyuria, polydipsia  COPD: Patient reports that's he takes Advair daily. Her chart says wixela, but patient recognizes picture of advair and says it is not wixela. She also uses her nebulizer from time to time. She has been needing to use it more. Patient with no recent h/o of PFT's on chart review. Last done in 011 showed moderate COPD, but patient has worsening cough and feels more short of breath.  Smoking status reviewed- smokes 6 cigarettes/day> interested in quitting, would like to talk with Dr. Valentina Lucks in smoking cessation clinic again, reports patches did not help in the past but thinks that gum may help.   ROS: 10 point ROS is otherwise negative, except as mentioned in HPI  Patient Active  Problem List   Diagnosis Date Noted  . Pulmonary nodules 01/04/2018  . Mass of left lower leg 01/04/2018  . Chronic kidney disease (CKD), stage III (moderate) ([place]) 06/04/2016  . Shoulder pain, left 04/06/2016  . Chronic back pain 10/02/2015  . Acute right hip pain 03/13/2012  . Episodic tension type headache 09/24/2010  . GAIT IMBALANCE 12/10/2009  . DM (diabetes mellitus), type 2 with peripheral vascular complications ([place]) 57/26/2035  . Chronic lymphocytic leukemia ([place]) 04/11/2009  . PERIPHERAL VASCULAR DISEASE 10/13/2007  . HYPERCHOLESTEROLEMIA 02/23/2007  . ANXIETY 02/23/2007  . TOBACCO DEPENDENCE 02/23/2007  . HYPERTENSION, BENIGN SYSTEMIC 02/23/2007  . COPD (chronic obstructive pulmonary disease) ([place]) 02/23/2007  . GASTROESOPHAGEAL REFLUX, NO ESOPHAGITIS 02/23/2007     Objective:  BP 140/70   Pulse 60   Temp 98 F (36.7 C) (Oral)   Ht 5\' 3"  (1.6 m)   Wt 126 lb (57.2 kg)   SpO2 98%   BMI 22.32 kg/m  Vitals and nursing note reviewed  General: NAD, pleasant Cardiac: RRR, normal heart sounds, no murmurs Respiratory: CTAB, no wheezing, normal effort on RA, productive cough Abdomen: soft, nontender, nondistended Extremities: no edema or cyanosis. WWP. Skin: warm and dry, no rashes noted Neuro: alert and oriented, no focal deficits Psych: normal affect  Assessment & Plan:    Shoulder pain, left Symptoms are most c/w rotator cuff impingement.  No signs of rotator cuff tear or adhesive capsulitis.  Discussed  option of steroid injection but patient would like to wait on this.  She would like to try exercises.  Recommended avoiding NSAIDs due to her chronic kidney disease.  Follow-up in 4 to 6 weeks if symptoms do not improve. Continue nightly tramadol for now to help with sleep. Discussed that this is not a great option given her age.  COPD (chronic obstructive pulmonary disease) ([place]) Reports uncontrolled, but not having exacerbation. Refilled advair and given script  for nebulizer machine along with albuterol. Patient to f/u with Dr. Valentina Lucks for repeat PFT's and smoking cessation counseling.   HYPERTENSION, BENIGN SYSTEMIC BP at goal with 140/70 in clinic. To continue current medications.   DM (diabetes mellitus), type 2 with peripheral vascular complications N8G well controlled. Patient to continue metformin per her request, and GFR 55, 2 months ago. Continue to monitor and will discuss d/c at next visit.   Health maintenance: Patient due for colonoscopy, mammogram and DEXA- placed referrals  [place] Abraham Entwistle, [place] [facility] Resident PGY-2

## 2018-10-04 NOTE — Assessment & Plan Note (Signed)
Symptoms are most c/w rotator cuff impingement.  No signs of rotator cuff tear or adhesive capsulitis.  Discussed option of steroid injection but patient would like to wait on this.  She would like to try exercises.  Recommended avoiding NSAIDs due to her chronic kidney disease.  Follow-up in 4 to 6 weeks if symptoms do not improve. Continue nightly tramadol for now to help with sleep. Discussed that this is not a great option given her age.

## 2018-10-04 NOTE — Assessment & Plan Note (Addendum)
A1c well controlled. Patient to continue metformin per her request, and GFR 55, 2 months ago. Continue to monitor and will discuss d/c at next visit.

## 2018-10-04 NOTE — Assessment & Plan Note (Signed)
BP at goal with 140/70 in clinic. To continue current medications.

## 2018-10-04 NOTE — Assessment & Plan Note (Signed)
Reports uncontrolled, but not having exacerbation. Refilled advair and given script for nebulizer machine along with albuterol. Patient to f/u with Dr. Valentina Lucks for repeat PFT's and smoking cessation counseling.

## 2018-10-05 ENCOUNTER — Other Ambulatory Visit: Payer: Self-pay | Admitting: Family Medicine

## 2018-10-11 ENCOUNTER — Encounter: Payer: Self-pay | Admitting: Gastroenterology

## 2018-10-20 ENCOUNTER — Other Ambulatory Visit: Payer: Self-pay | Admitting: Internal Medicine

## 2018-10-26 ENCOUNTER — Other Ambulatory Visit: Payer: Self-pay | Admitting: Family Medicine

## 2018-11-02 ENCOUNTER — Telehealth: Payer: Self-pay | Admitting: *Deleted

## 2018-11-02 ENCOUNTER — Encounter: Payer: Self-pay | Admitting: Pharmacist

## 2018-11-02 ENCOUNTER — Ambulatory Visit (INDEPENDENT_AMBULATORY_CARE_PROVIDER_SITE_OTHER): Payer: Medicare Other | Admitting: Pharmacist

## 2018-11-02 VITALS — Ht 62.0 in | Wt 126.2 lb

## 2018-11-02 DIAGNOSIS — J449 Chronic obstructive pulmonary disease, unspecified: Secondary | ICD-10-CM | POA: Diagnosis not present

## 2018-11-02 DIAGNOSIS — E1151 Type 2 diabetes mellitus with diabetic peripheral angiopathy without gangrene: Secondary | ICD-10-CM

## 2018-11-02 MED ORDER — UMECLIDINIUM BROMIDE 62.5 MCG/INH IN AEPB: 1.0000 | INHALATION_SPRAY | Freq: Every day | RESPIRATORY_TRACT | 0 refills | Status: DC

## 2018-11-02 NOTE — Assessment & Plan Note (Signed)
Patient has been experiencing coughing, shortness of breath, wheezing for many years and taking Advair + albuterol HFA   Spirometry evaluation reveals Mild restrictive lung disease. Post nebulized albuterol tx revealed moderate obstruction with significant pre-post change. -Begin Incruse (umeclidinium) one inhalation once a day -Continue Advair one puff BID. Counseled patient on the importance of adherence to the second dose. -Set quit day as today (11/02/18) but reported having some cigarettes left  -Educated patient on purpose, proper use, potential adverse effects including risk of esophageal candidiasis and need to rinse mouth after each use with Advair use.  -Reviewed results of pulmonary function tests.   Chronic tobacco abuse. Prepared to make a quit attempt in the next few days.  She and her husband are plan to use nicotine gum to attempt complete cessation.   Provided encouragement for their attempt.  F/U at next visit.

## 2018-11-02 NOTE — Progress Notes (Signed)
   S:    Patient arrives in good health and good spirits with her husband. Presents for lung function evaluation.  Patient was referred by Dr. Martinique (referred on 09/29/2018 at last visit) Patient reports breathing has been difficult with activities but only bothers occasionally at rest and coughing is the main symptom. Patient denies atopic sx consistent with atopic dermatitis, allergic rhinitis.     Patient reports working for 36 years at [employer] and reports needing to wear a "special mask" for her breathing while she worked there.   She had to wear one that was different than other workers.   Notes that she does not have a nebulizer machine because she misplaced this prescription.  Patient denies adherence to medications - often misses the evening Advair dose Patient reports last dose of asthma medications was last night Current asthma medications: Advair 250/50, Albuterol HFA PRN  Rescue inhaler use frequency: At least daily  Level of asthma sx control- in the last 4 weeks: Question Scoring Patient Score  Daytime sx more than twice a week Yes (1) No (0) Yes   Any nighttime waking due to asthma Yes (1) No (0) Yes  Reliever needed >2x/week Yes (1) No (0) Yes  Any activity limitation due to asthma Yes (1) No (0) Yes   Total Score  4  Well controlled - 0, partly controlled - 1-2, uncontrolled 3-4  O: Physical Exam  Constitutional: She appears well-developed and well-nourished.  Vitals reviewed.   Review of Systems  Respiratory: Positive for shortness of breath.   All other systems reviewed and are negative.  See "scanned report" or Documentation Flowsheet (discrete results - PFTs) for  Spirometry results. Patient provided good effort while attempting spirometry.   Lung Age = 89 Albuterol Neb  Lot# [id number]  Exp. 03/2020  A/P: Patient has been experiencing coughing, shortness of breath, wheezing for many years and taking Advair + albuterol HFA   Spirometry evaluation  reveals Mild restrictive lung disease. Post nebulized albuterol tx revealed moderate obstruction with significant pre-post change. -Begin Incruse (umeclidinium) one inhalation once a day -Continue Advair one puff BID. Counseled patient on the importance of adherence to the second dose. -Set quit day as today (11/02/18) but reported having some cigarettes left  -Educated patient on purpose, proper use, potential adverse effects including risk of esophageal candidiasis and need to rinse mouth after each use with Advair use.  -Reviewed results of pulmonary function tests.   Chronic tobacco abuse. Prepared to make a quit attempt in the next few days.  She and her husband are plan to use nicotine gum to attempt complete cessation.   Provided encouragement for their attempt.  F/U at next visit.   Patient verbalized understanding of results and education. Written pt instructions provided.   F/U Rx Clinic visit in one month to see if Incruse is helping. Total time in face to face counseling 25 minutes.  Patient seen with Andee Poles, PharmD Candidate and Catie Darnelle Maffucci, PharmD,  PGY2 Pharmacy Resident.

## 2018-11-02 NOTE — Telephone Encounter (Signed)
Dr. Tarri Glenn,  This patient is scheduled for a direct colonoscopy at [facility] on 11/21/18. She takes Pletal. Ok to proceed with direct colonoscopy or would you prefer an OV? If ok to proceed with Direct, do you wish for her to stop Pletal for any period of time prior to her colonscopy?  Thank you, Olivia Mackie

## 2018-11-02 NOTE — Telephone Encounter (Signed)
OK to proceed on Pletal. Thank you.

## 2018-11-02 NOTE — Patient Instructions (Addendum)
It was great to see you today!   We are going to add on a third medication - INCRUSE. This is one puff once daily. Take this in the morning, as well as the ADVAIR twice daily.   We are so proud of you both for deciding to quit smoking! Let us know if we can help any ways.    Schedule follow up with [place] or Dr. Enid Derry in about a month to check on your breathing and your success at quitting smoking!

## 2018-11-13 ENCOUNTER — Ambulatory Visit (AMBULATORY_SURGERY_CENTER): Payer: Self-pay | Admitting: *Deleted

## 2018-11-13 ENCOUNTER — Telehealth: Payer: Self-pay | Admitting: *Deleted

## 2018-11-13 VITALS — Ht 62.0 in | Wt 126.0 lb

## 2018-11-13 DIAGNOSIS — Z1211 Encounter for screening for malignant neoplasm of colon: Secondary | ICD-10-CM

## 2018-11-13 NOTE — Progress Notes (Signed)
Patient is on plavix,  Per her med list she brought with her to [facility]. Office visit made with Dr.Beavers. Last colon 2014, release of information signed and given to [place].

## 2018-11-13 NOTE — Telephone Encounter (Signed)
Per patient during PV today, her last colon was 2014 in [place], [place]. Release of information form filled out and signed by the patient. Given to [facility] brown, CMA. Pt is for OV with Dr.Beavers.

## 2018-11-21 ENCOUNTER — Encounter: Payer: Medicare Other | Admitting: Gastroenterology

## 2018-11-25 ENCOUNTER — Other Ambulatory Visit: Payer: Self-pay | Admitting: Internal Medicine

## 2018-11-28 ENCOUNTER — Other Ambulatory Visit: Payer: Self-pay | Admitting: Family Medicine

## 2018-11-29 ENCOUNTER — Encounter: Payer: Self-pay | Admitting: Family Medicine

## 2018-11-29 ENCOUNTER — Ambulatory Visit (INDEPENDENT_AMBULATORY_CARE_PROVIDER_SITE_OTHER): Payer: Medicare Other | Admitting: Family Medicine

## 2018-11-29 ENCOUNTER — Other Ambulatory Visit: Payer: Self-pay

## 2018-11-29 DIAGNOSIS — E1151 Type 2 diabetes mellitus with diabetic peripheral angiopathy without gangrene: Secondary | ICD-10-CM

## 2018-11-29 DIAGNOSIS — Z23 Encounter for immunization: Secondary | ICD-10-CM | POA: Diagnosis not present

## 2018-11-29 DIAGNOSIS — J449 Chronic obstructive pulmonary disease, unspecified: Secondary | ICD-10-CM

## 2018-11-29 DIAGNOSIS — J4520 Mild intermittent asthma, uncomplicated: Secondary | ICD-10-CM | POA: Diagnosis not present

## 2018-11-29 NOTE — Progress Notes (Signed)
  Subjective:    Patient ID: [name] [name], female    DOB: 1946-03-24, 72 y.o.   MRN: [id number]   CC: f/u COPD  HPI:  COPD: -Patient reports that she is continuing to try to stop smoking.  A neighbor upstairs has given her and her husband a large pocket of nicotine gum which she believes has been helping her.  However patient reports that she is still smoking 2 to 3 cigarettes/day.  However she is hopeful that she will be able to quit before the end of the year.  Patient reports that she feels as if she her breathing is a lot better.  She has been using her Advair and Incruse daily as well as her Singulair nightly.  She has not required her albuterol very much.  Patient reports that she has lost her nebulizer prescription and would like to have another one today.  Health maintenance  Patient has colonoscopy scheduled  Smoking status reviewed-see above ROS: 10 point ROS is otherwise negative, except as mentioned in HPI  Patient Active Problem List   Diagnosis Date Noted  . Pulmonary nodules 01/04/2018  . Mass of left lower leg 01/04/2018  . Chronic kidney disease (CKD), stage III (moderate) ([place]) 06/04/2016  . Shoulder pain, left 04/06/2016  . Chronic back pain 10/02/2015  . Acute right hip pain 03/13/2012  . Episodic tension type headache 09/24/2010  . GAIT IMBALANCE 12/10/2009  . DM (diabetes mellitus), type 2 with peripheral vascular complications ([place]) 06/25/1600  . Chronic lymphocytic leukemia ([place]) 04/11/2009  . PERIPHERAL VASCULAR DISEASE 10/13/2007  . HYPERCHOLESTEROLEMIA 02/23/2007  . ANXIETY 02/23/2007  . TOBACCO DEPENDENCE 02/23/2007  . HYPERTENSION, BENIGN SYSTEMIC 02/23/2007  . COPD (chronic obstructive pulmonary disease) ([place]) 02/23/2007  . GASTROESOPHAGEAL REFLUX, NO ESOPHAGITIS 02/23/2007     Objective:  BP 140/61   Pulse 63   Temp 97.7 F (36.5 C) (Oral)   Wt 123 lb (55.8 kg)   SpO2 98%   BMI 22.50 kg/m  Vitals and nursing note reviewed  General:  NAD, pleasant Cardiac: RRR, normal heart sounds, no murmurs Respiratory: CTAB, normal effort Extremities: no edema or cyanosis. WWP. Skin: warm and dry, no rashes noted Neuro: alert and oriented, no focal deficits Psych: normal affect  Assessment & Plan:    DM (diabetes mellitus), type 2 with peripheral vascular complications ([place]) Well controlled. Last A1c 6.2. Patient on 500mg  metformin BID, and will lab work performed by oncology on 12/05, so will not collect labs today. Patient's last GFR was 55 and is on reduced dose of metformin. Will continue to monitor renal function.   COPD (chronic obstructive pulmonary disease) ([place]) Patient doing well on Advair and Incruse.  We will continue current management.  Patient given nebulizer machine while in clinic.  Patient also received pneumonia vaccine today.  Patient encouraged to continue to try to stop smoking altogether. Follow-up with me in 6 weeks or sooner if needed.    [place] Vaishnav Demartin, DO Family Medicine Resident PGY-2

## 2018-11-29 NOTE — Patient Instructions (Addendum)
Thank you for coming to see me today. It was a pleasure! Today we talked about:   Please continue to try to stop smoking. I am happy that you have cut back so much!  Continue to use your Advair twice daily, your Incruse once daily and albuterol inhaler as needed.   Please have your labs drawn at the oncologist tomorrow.   Please follow-up with me in 6 weeks or sooner as needed.  If you have any questions or concerns, please do not hesitate to call the office at [phone].  Take Care,   [place] Eliodoro Gullett, DO  Chronic Obstructive Pulmonary Disease Chronic obstructive pulmonary disease (COPD) is a long-term (chronic) lung problem. When you have COPD, it is hard for air to get in and out of your lungs. The way your lungs work will never return to normal. Usually the condition gets worse over time. There are things you can do to keep yourself as healthy as possible. Your doctor may treat your condition with:  Medicines.  Quitting smoking, if you smoke.  Rehabilitation. This may involve a team of specialists.  Oxygen.  Exercise and changes to your diet.  Lung surgery.  Comfort measures (palliative care).  Follow these instructions at home: Medicines  Take over-the-counter and prescription medicines only as told by your doctor.  Talk to your doctor before taking any cough or allergy medicines. You may need to avoid medicines that cause your lungs to be dry. Lifestyle  If you smoke, stop. Smoking makes the problem worse. If you need help quitting, ask your doctor.  Avoid being around things that make your breathing worse. This may include smoke, chemicals, and fumes.  Stay active, but remember to also rest.  Learn and use tips on how to relax.  Make sure you get enough sleep. Most adults need at least 7 hours a night.  Eat healthy foods. Eat smaller meals more often. Rest before meals. Controlled breathing  Learn and use tips on how to control your breathing as told by  your doctor. Try: ? Breathing in (inhaling) through your nose for 1 second. Then, pucker your lips and breath out (exhale) through your lips for 2 seconds. ? Putting one hand on your belly (abdomen). Breathe in slowly through your nose for 1 second. Your hand on your belly should move out. Pucker your lips and breathe out slowly through your lips. Your hand on your belly should move in as you breathe out. Controlled coughing  Learn and use controlled coughing to clear mucus from your lungs. The steps are: 1. Lean your head a little forward. 2. Breathe in deeply. 3. Try to hold your breath for 3 seconds. 4. Keep your mouth slightly open while coughing 2 times. 5. Spit any mucus out into a tissue. 6. Rest and do the steps again 1 or 2 times as needed. General instructions  Make sure you get all the shots (vaccines) that your doctor recommends. Ask your doctor about a flu shot and a pneumonia shot.  Use oxygen therapy and therapy to help improve your lungs (pulmonary rehabilitation) if told by your doctor. If you need home oxygen therapy, ask your doctor if you should buy a tool to measure your oxygen level (oximeter).  Make a COPD action plan with your doctor. This helps you know what to do if you feel worse than usual.  Manage any other conditions you have as told by your doctor.  Avoid going outside when it is very hot, cold,  or humid.  Avoid people who have a sickness you can catch (contagious).  Keep all follow-up visits as told by your doctor. This is important. Contact a doctor if:  You cough up more mucus than usual.  There is a change in the color or thickness of the mucus.  It is harder to breathe than usual.  Your breathing is faster than usual.  You have trouble sleeping.  You need to use your medicines more often than usual.  You have trouble doing your normal activities such as getting dressed or walking around the house. Get help right away if:  You have  shortness of breath while resting.  You have shortness of breath that stops you from: ? Being able to talk. ? Doing normal activities.  Your chest hurts for longer than 5 minutes.  Your skin color is more blue than usual.  Your pulse oximeter shows that you have low oxygen for longer than 5 minutes.  You have a fever.  You feel too tired to breathe normally. Summary  Chronic obstructive pulmonary disease (COPD) is a long-term lung problem.  The way your lungs work will never return to normal. Usually the condition gets worse over time. There are things you can do to keep yourself as healthy as possible.  Take over-the-counter and prescription medicines only as told by your doctor.  If you smoke, stop. Smoking makes the problem worse. This information is not intended to replace advice given to you by your health care provider. Make sure you discuss any questions you have with your health care provider. Document Released: 05/31/2008 Document Revised: 05/20/2016 Document Reviewed: 08/09/2013 Elsevier Interactive Patient Education  2017 [employer].

## 2018-11-30 ENCOUNTER — Telehealth: Payer: Self-pay | Admitting: Hematology

## 2018-11-30 ENCOUNTER — Other Ambulatory Visit: Payer: Self-pay | Admitting: Hematology

## 2018-11-30 ENCOUNTER — Inpatient Hospital Stay: Payer: Medicare Other

## 2018-11-30 ENCOUNTER — Inpatient Hospital Stay: Payer: Medicare Other | Attending: Hematology | Admitting: Hematology

## 2018-11-30 VITALS — BP 142/71 | HR 69 | Temp 98.8°F | Resp 16 | Ht 62.0 in | Wt 124.1 lb

## 2018-11-30 DIAGNOSIS — N951 Menopausal and female climacteric states: Secondary | ICD-10-CM

## 2018-11-30 DIAGNOSIS — D649 Anemia, unspecified: Secondary | ICD-10-CM

## 2018-11-30 DIAGNOSIS — Z720 Tobacco use: Secondary | ICD-10-CM | POA: Insufficient documentation

## 2018-11-30 DIAGNOSIS — C9110 Chronic lymphocytic leukemia of B-cell type not having achieved remission: Secondary | ICD-10-CM

## 2018-11-30 DIAGNOSIS — R911 Solitary pulmonary nodule: Secondary | ICD-10-CM | POA: Diagnosis not present

## 2018-11-30 DIAGNOSIS — M62838 Other muscle spasm: Secondary | ICD-10-CM | POA: Diagnosis not present

## 2018-11-30 LAB — CBC WITH DIFFERENTIAL/PLATELET
Abs Immature Granulocytes: 0.18 10*3/uL — ABNORMAL HIGH (ref 0.00–0.07)
Basophils Absolute: 0.1 10*3/uL (ref 0.0–0.1)
Basophils Relative: 0 %
Eosinophils Absolute: 0.3 10*3/uL (ref 0.0–0.5)
Eosinophils Relative: 1 %
HCT: 35.4 % — ABNORMAL LOW (ref 36.0–46.0)
Hemoglobin: 11 g/dL — ABNORMAL LOW (ref 12.0–15.0)
Immature Granulocytes: 0 %
Lymphocytes Relative: 64 %
Lymphs Abs: 29.9 10*3/uL — ABNORMAL HIGH (ref 0.7–4.0)
MCH: 27.4 pg (ref 26.0–34.0)
MCHC: 31.1 g/dL (ref 30.0–36.0)
MCV: 88.1 fL (ref 80.0–100.0)
Monocytes Absolute: 5.8 10*3/uL — ABNORMAL HIGH (ref 0.1–1.0)
Monocytes Relative: 12 %
Neutro Abs: 10.8 10*3/uL — ABNORMAL HIGH (ref 1.7–7.7)
Neutrophils Relative %: 23 %
Platelets: 228 10*3/uL (ref 150–400)
RBC: 4.02 MIL/uL (ref 3.87–5.11)
RDW: 17.9 % — ABNORMAL HIGH (ref 11.5–15.5)
WBC: 47.2 10*3/uL — ABNORMAL HIGH (ref 4.0–10.5)
nRBC: 0 % (ref 0.0–0.2)

## 2018-11-30 LAB — CMP (CANCER CENTER ONLY)
ALT: 10 U/L (ref 0–44)
AST: 22 U/L (ref 15–41)
Albumin: 3.5 g/dL (ref 3.5–5.0)
Alkaline Phosphatase: 129 U/L — ABNORMAL HIGH (ref 38–126)
Anion gap: 10 (ref 5–15)
BUN: 9 mg/dL (ref 8–23)
CO2: 30 mmol/L (ref 22–32)
Calcium: 9.1 mg/dL (ref 8.9–10.3)
Chloride: 102 mmol/L (ref 98–111)
Creatinine: 1.34 mg/dL — ABNORMAL HIGH (ref 0.44–1.00)
GFR, Est AFR Am: 46 mL/min — ABNORMAL LOW (ref >60)
GFR, Estimated: 40 mL/min — ABNORMAL LOW (ref >60)
Glucose, Bld: 137 mg/dL — ABNORMAL HIGH (ref 70–99)
Potassium: 3.4 mmol/L — ABNORMAL LOW (ref 3.5–5.1)
Sodium: 142 mmol/L (ref 135–145)
Total Bilirubin: 0.4 mg/dL (ref 0.3–1.2)
Total Protein: 6.6 g/dL (ref 6.5–8.1)

## 2018-11-30 LAB — SAMPLE TO BLOOD BANK

## 2018-11-30 LAB — LACTATE DEHYDROGENASE: LDH: 209 U/L — ABNORMAL HIGH (ref 98–192)

## 2018-11-30 NOTE — Assessment & Plan Note (Signed)
Well controlled. Last A1c 6.2. Patient on 500mg  metformin BID, and will lab work performed by oncology on 12/05, so will not collect labs today. Patient's last GFR was 55 and is on reduced dose of metformin. Will continue to monitor renal function.

## 2018-11-30 NOTE — Telephone Encounter (Signed)
Gave patient avs report and appointments °

## 2018-11-30 NOTE — Progress Notes (Signed)
HEMATOLOGY/ONCOLOGY CLINIC NOTE  Date of Service: 11/30/18    Patient Care Team: Shirley, [place], DO as PCP - General (Family Medicine) Pleasant, Eppie Gibson, RN as [facility]  CHIEF COMPLAINTS/PURPOSE OF CONSULTATION:  F/u for CLL  HISTORY OF PRESENTING ILLNESS:   [name] [name] is a wonderful 72 y.o. female smoker with history of CLL who has been referred to [place] by Dr Enid Derry, [place], DO at [facility] for evaluation and management of elevated WBC/lymphocytes.   She is accompanied by her husband. She reported to ED on 11/27/2017 with cough and abdominal pain with COPD exacerbation. Her labs on this day showed WBC elevated at 62.5. She has not previously seen a hematologist. She reports a plan to quit smoking on the first day of the new year. She reports worsening decrease in balance and states she has a cane and walker that she uses as needed. She was taking Prednisone.  No weight loss/fevers/chills/night sweats/ palpable lumps or bumps.  On review of systems, pt reports back pain, change in balance, abdominal pain, weight loss and denies changes in BM, fever and any other accompanying symptoms.  INTERVAL HISTORY:  [name] [name] is here for management and evaluation of f her Chronic Leukocytic Leukemia. The patient's last visit with [place] was on 07/31/18. She is accompanied today by her husband. The pt reports that she is doing well overall.   The pt reports that she continues to have muscle spasms. She notes that her sciatica has been bothersome and hasn't received any interventions with her PCP yet.   The pt had a pneumonia vaccine in the interim.   She denies any other concerns and denies fevers or chills. She denies noticing any new lumps or bumps. She notes that her occasional night sweats have not worsened, and do not appear to be drenching in nature.   Lab results today (11/30/18) of CBC w/diff and CMP is as follows: all values are WNL except  for WBC at 47.2k, HGB at 11.0, HCT at 35.4, RDW at 17.9, ANC at 10.8k, Lymphs abs at 29.9k, Monocytes abs at 5.8k, Abs immature granulocytes at 0.18k, Potassium at 3.4, Glucose at 137, Creatinine at 1.34, Alk Phos at 129, GFR at 46. 11/30/18 LDH is at 209  On review of systems, pt reports stable energy levels, bothersome sciatica, occasional muscle spasms, some night sweats, and denies drenching night sweats, leg swelling, fevers, chills, unexpected weight loss, and any other symptoms.   MEDICAL HISTORY:  Past Medical History:  Diagnosis Date  . Allergy    environmental  . Asthma   . CLL (chronic lymphoblastic leukemia)   . Constipation 11/15/2011  . COPD (chronic obstructive pulmonary disease) ([place])   . Diabetes mellitus without complication ([place])   . GERD (gastroesophageal reflux disease)   . Hyperlipidemia   . Hypertension   . PVD (peripheral vascular disease) ([place])   . Stroke [facility]) 1998   in 1998 due to tumor-right side    SURGICAL HISTORY: Past Surgical History:  Procedure Laterality Date  . ABI  02/2012   ABI <0.65 BL 02/2012  . BRAIN MENINGIOMA EXCISION    . Bypass grafting of RLE for PAD claudication     . CESAREAN SECTION     1974, 78 ,80  . CHOLECYSTECTOMY, LAPAROSCOPIC    . COLONOSCOPY  2014   [place], [place]  . ESOPHAGEAL DILATION      SOCIAL HISTORY: Social History   Socioeconomic History  . Marital  status: Married    Spouse name: Not on file  . Number of children: 3  . Years of education: Not on file  . Highest education level: Not on file  Occupational History  . Occupation: DISABLED    Employer: DISABLED  Social Needs  . Financial resource strain: Not on file  . Food insecurity:    Worry: Not on file    Inability: Not on file  . Transportation needs:    Medical: Not on file    Non-medical: Not on file  Tobacco Use  . Smoking status: Current Some Day Smoker    Packs/day: 0.25    Years: 30.00    Pack years: 7.50    Types: Cigarettes    Start  date: 01/01/1976  . Smokeless tobacco: Never Used  Substance and Sexual Activity  . Alcohol use: No    Alcohol/week: 0.0 standard drinks  . Drug use: No  . Sexual activity: Yes  Lifestyle  . Physical activity:    Days per week: Not on file    Minutes per session: Not on file  . Stress: Not on file  Relationships  . Social connections:    Talks on phone: Not on file    Gets together: Not on file    Attends religious service: Not on file    Active member of club or organization: Not on file    Attends meetings of clubs or organizations: Not on file    Relationship status: Not on file  . Intimate partner violence:    Fear of current or ex partner: Not on file    Emotionally abused: Not on file    Physically abused: Not on file    Forced sexual activity: Not on file  Other Topics Concern  . Not on file  Social History Narrative   Health Care POA:    Emergency Contact: Nazaret Chea [phone] (c)   End of Life Plan:    Who lives with you: Lives with husband   Any pets: none   Diet: Patient lacks financial resources for much food. Pt reports eating what is available.   Exercise: Patient does not have an exercise plan.   Seatbelts: Patient reports wearing seatbelt when in vehicle.    [name] Exposure/Protection:   Hobbies: Bowling, computer games, Bingo      Has financial difficulties and transportation issues as she and her husband share transportation        FAMILY HISTORY: Family History  Problem Relation Age of Onset  . Heart disease Mother   . Asthma Mother   . Cancer Mother        uterine   . Depression Mother   . Heart attack Mother 66  . Hyperlipidemia Mother   . Hypertension Mother   . Stroke Mother   . Kidney disease Mother   . Heart attack Sister   . Stroke Sister   . Depression Sister   . Diabetes Sister   . Hyperlipidemia Sister   . Depression Sister   . Diabetes Sister   . Hyperlipidemia Sister   . HIV/AIDS Sister   . Diabetes Brother   . Asthma  Brother   . Hypertension Brother   . Cancer Maternal Aunt        lung  . Heart disease Maternal Grandmother   . Heart attack Maternal Grandmother   . Cancer Brother        colon  . HIV/AIDS Brother   . Colon cancer Neg Hx  ALLERGIES:  is allergic to aspirin and baclofen.  MEDICATIONS:  Current Outpatient Medications  Medication Sig Dispense Refill  . albuterol (ACCUNEB) 0.63 MG/3ML nebulizer solution Take 3 mLs (0.63 mg total) by nebulization every 6 (six) hours as needed for wheezing. (Patient not taking: Reported on 11/02/2018) 75 mL 12  . Blood Glucose Monitoring Suppl (ONETOUCH VERIO) w/Device KIT 1 Device by Does not apply route daily. 1 kit 0  . clopidogrel (PLAVIX) 75 MG tablet Take 75 mg by mouth daily.    . cyclobenzaprine (FLEXERIL) 10 MG tablet TAKE ONE TABLET BY MOUTH EVERY DAY AT BEDTIME 30 tablet 2  . diclofenac sodium (VOLTAREN) 1 % GEL Apply 2 g topically 4 (four) times daily. 100 g 2  . DULoxetine (CYMBALTA) 30 MG capsule TAKE 1 CAPSULE BY MOUTH  DAILY 90 capsule 1  . enalapril (VASOTEC) 20 MG tablet Take 1 tablet (20 mg total) by mouth daily. 90 tablet 3  . famotidine (PEPCID) 20 MG tablet TAKE ONE TABLET BY MOUTH EVERY DAY 30 tablet 11  . Fluticasone-Salmeterol (ADVAIR DISKUS) 250-50 MCG/DOSE AEPB Inhale 1 puff into the lungs 2 (two) times daily. 1 each 3  . glucose blood (ONETOUCH VERIO) test strip Use to check blood sugar once a day 100 each 12  . hydrochlorothiazide (MICROZIDE) 12.5 MG capsule Take 1 capsule (12.5 mg total) by mouth daily. 90 capsule 3  . Lancets (ONETOUCH ULTRASOFT) lancets Use to check blood sugar once a day 100 each 12  . metFORMIN (GLUCOPHAGE) 500 MG tablet TAKE 1 TABLET BY MOUTH  TWICE A DAY WITH A MEAL 180 tablet 2  . metoprolol tartrate (LOPRESSOR) 25 MG tablet TAKE ONE HALF TABLET BY MOUTH TWICE DAILY 30 tablet 11  . Misc. Devices (ROLLER WALKER) MISC 1 rolling walker with seat 1 each 0  . montelukast (SINGULAIR) 10 MG tablet TAKE ONE  TABLET BY MOUTH AT BEDTIME 30 tablet 11  . nicotine polacrilex (NICORETTE) 4 MG gum Take 1 each (4 mg total) by mouth as needed for smoking cessation. (Patient not taking: Reported on 11/02/2018) 100 tablet 0  . nitroGLYCERIN (NITROSTAT) 0.4 MG SL tablet DISSOLVE ONE TABLET UNDER THE TONGUE EVERY 5 MINUTES, UP TO 3 DOSES FOR CHEST PAIN (Patient not taking: Reported on 11/13/2018) 25 tablet 11  . PROAIR HFA 108 (90 Base) MCG/ACT inhaler INHALE 2 PUFFS BY MOUTH EVERY 6 HOURS AS NEEDED FOR WHEEZING OR SHORTNESS OF BREATH 8.5 each 11  . simvastatin (ZOCOR) 20 MG tablet TAKE ONE TABLET BY MOUTH AT BEDTIME 30 tablet 11  . traMADol (ULTRAM) 50 MG tablet TAKE ONE TABLET BY MOUTH EVERY DAY AS NEEDED FOR MODERATE PAIN 30 tablet 5  . umeclidinium bromide (INCRUSE ELLIPTA) 62.5 MCG/INH AEPB Inhale 1 puff into the lungs daily. 4 each 0   No current facility-administered medications for this visit.     REVIEW OF SYSTEMS:    A 10+ POINT REVIEW OF SYSTEMS WAS OBTAINED including neurology, dermatology, psychiatry, cardiac, respiratory, lymph, extremities, GI, GU, Musculoskeletal, constitutional, breasts, reproductive, HEENT.  All pertinent positives are noted in the HPI.  All others are negative.    PHYSICAL EXAMINATION: ECOG PERFORMANCE STATUS: 1 - Symptomatic but completely ambulatory  . Vitals:   11/30/18 1440  BP: (!) 142/71  Pulse: 69  Resp: 16  Temp: 98.8 F (37.1 C)  SpO2: 97%   Filed Weights   11/30/18 1440  Weight: 124 lb 1.6 oz (56.3 kg)   .Body mass index is 22.7 kg/m.  GENERAL:alert,  in no acute distress and comfortable SKIN: no acute rashes, no significant lesions EYES: conjunctiva are pink and non-injected, sclera anicteric OROPHARYNX: MMM, no exudates, no oropharyngeal erythema or ulceration NECK: supple, no JVD LYMPH:  Minimally palpable cervical lymph nodes, no palpable lymphadenopathy in the axillary or inguinal regions LUNGS: clear to auscultation b/l with normal  respiratory effort HEART: regular rate & rhythm ABDOMEN:  normoactive bowel sounds , non tender, not distended. No palpable hepatosplenomegaly.  Extremity: no pedal edema PSYCH: alert & oriented x 3 with fluent speech NEURO: no focal motor/sensory deficits   LABORATORY DATA:  I have reviewed the data as listed  . CBC Latest Ref Rng & Units 11/30/2018 07/31/2018 05/11/2018  WBC 4.0 - 10.5 K/uL 47.2(H) 53.7(HH) 89.9(HH)  Hemoglobin 12.0 - 15.0 g/dL 11.0(L) 11.7 11.3(L)  Hematocrit 36.0 - 46.0 % 35.4(L) 37.0 36.7  Platelets 150 - 400 K/uL 228 234 262    . CMP Latest Ref Rng & Units 11/30/2018 07/31/2018 05/11/2018  Glucose 70 - 99 mg/dL 137(H) 112(H) 171(H)  BUN 8 - 23 mg/dL _0 Creatinine 0.44 - 1.00 mg/dL 1.34(H) 1.15(H) 1.35(H)  Sodium 135 - 145 mmol/L 142 143 140  Potassium 3.5 - 5.1 mmol/L 3.4(L) 4.2 4.3  Chloride 98 - 111 mmol/L 102 106 105  CO2 22 - 32 mmol/L _1 Calcium 8.9 - 10.3 mg/dL 9.1 9.8 8.7  Total Protein 6.5 - 8.1 g/dL 6.6 6.7 5.9(L)  Total Bilirubin 0.3 - 1.2 mg/dL 0.4 0.3 0.2  Alkaline Phos 38 - 126 U/L 129(H) 87 89  AST 15 - 41 U/L _2 ALT 0 - 44 U/L _3 . Lab Results  Component Value Date   LDH 209 (H) 11/30/2018    RADIOGRAPHIC STUDIES: I have personally reviewed the radiological images as listed and agreed with the findings in the report. No results found. Surgical Pathology 12/13/2017   ASSESSMENT & PLAN:   JEANNE DIEFENDORF is a wonderful 72 y.o. female with    1. Rai 1 Chronic Lymphocytic Leukemia  - Trisomy 12 mutation. No associated thrombocytopenia.  Mild anemia - likely from acute blood loss from significant epistaxis. No constitutional symptoms. Minimal LNaednopathy No splenomegaly  PLAN:   -Discussed pt labwork today, 11/30/18; Lymphs have again decreased from 47.7k on 07/31/18 to 29.9k today. LDH decreased slightly to 209. Potassium borderline low at 3.4, Alk Phos slightly increased to 129, blood chemistries  otherwise stable -The pt shows no clinical or lab progression of her CLL at this time.  -No indication for further treatment at this time.   -Pt notes chronic hot flashes but not specifically drenching night sweats -again counseled on important symptoms to monitor for  -Recommend the pt continue using her walker for balance due to concerns with neuropathy. -Recommend discussing strength and balance optimization with her PCP and consider a PT referral -Will see the pt back in 3-4 months     #2 .Lung nodules on CT in 12/2017. Rpt CT chest 08/09/2018 - stable  #3 Patient Active Problem List   Diagnosis Date Noted  . Pulmonary nodules 01/04/2018  . Mass of left lower leg 01/04/2018  . Chronic kidney disease (CKD), stage III (moderate) ([place]) 06/04/2016  . Shoulder pain, left 04/06/2016  . Chronic back pain 10/02/2015  . Acute right hip pain 03/13/2012  . Episodic tension type headache 09/24/2010  . GAIT IMBALANCE 12/10/2009  . DM (diabetes mellitus), type  2 with peripheral vascular complications ([place]) 61/53/7943  . Chronic lymphocytic leukemia ([place]) 04/11/2009  . PERIPHERAL VASCULAR DISEASE 10/13/2007  . HYPERCHOLESTEROLEMIA 02/23/2007  . ANXIETY 02/23/2007  . TOBACCO DEPENDENCE 02/23/2007  . HYPERTENSION, BENIGN SYSTEMIC 02/23/2007  . COPD (chronic obstructive pulmonary disease) ([place]) 02/23/2007  . GASTROESOPHAGEAL REFLUX, NO ESOPHAGITIS 02/23/2007   Mx per PCP   RTC with Dr Irene Limbo with labs in 4 months   All of the patients questions were answered with apparent satisfaction. The patient knows to call the clinic with any problems, questions or concerns.  The total time spent in the appt was 20 minutes and more than 50% was on counseling and direct patient cares.     Sullivan Lone MD [place] AAHIVMS [facility] Hematology/Oncology Physician [facility]  (Office):       [phone] (Work cell):  [phone] (Fax):           [phone]  I, Baldwin [place], am acting as  a scribe for Dr. Sullivan Lone.   .I have reviewed the above documentation for accuracy and completeness, and I agree with the above. Brunetta Genera MD

## 2018-12-01 ENCOUNTER — Ambulatory Visit (INDEPENDENT_AMBULATORY_CARE_PROVIDER_SITE_OTHER): Payer: Medicare Other | Admitting: Gastroenterology

## 2018-12-01 ENCOUNTER — Encounter: Payer: Self-pay | Admitting: Gastroenterology

## 2018-12-01 VITALS — BP 128/62 | HR 68 | Ht 62.0 in | Wt 122.0 lb

## 2018-12-01 DIAGNOSIS — K2970 Gastritis, unspecified, without bleeding: Secondary | ICD-10-CM

## 2018-12-01 DIAGNOSIS — Z86010 Personal history of colonic polyps: Secondary | ICD-10-CM

## 2018-12-01 MED ORDER — NA SULFATE-K SULFATE-MG SULF 17.5-3.13-1.6 GM/177ML PO SOLN: 1.0000 | ORAL | 0 refills | Status: DC

## 2018-12-01 NOTE — Assessment & Plan Note (Addendum)
Patient doing well on Advair and Incruse.  We will continue current management.  Patient given nebulizer machine while in clinic.  Patient also received pneumonia vaccine today.  Patient encouraged to continue to try to stop smoking altogether. Follow-up with me in 6 weeks or sooner if needed.

## 2018-12-01 NOTE — Progress Notes (Signed)
 Referring Provider: Shirley, Jordan, DO Primary Care Physician:  Shirley, Jordan, DO   Reason for Consultation:  Polyps   IMPRESSION:  Personal history of colon polyps    - 3 adenomatous and 3 hyperplastic polyps 10/03/12 (Dr. Yunis, [place], [place]) Gastritis on EGD 2013    - biopsies negative for H pylori Recent use of Plavix, not currently prescribed  PLAN: Colonoscopy  I consented the patient at the bedside today discussing the risks, benefits, and alternatives to endoscopic evaluation. In particular, we discussed the risks that include, but are not limited to, reaction to medication, cardiopulmonary compromise, bleeding requiring blood transfusion, aspiration resulting in pneumonia, perforation requiring surgery, and even death. We reviewed the risk of missed lesion including polyps or even cancer. The patient acknowledges these risks and asks that we proceed.   HPI: [name] [name] is a 72 y.o. female with CLL, DM with peripheral vascular complications, and COPD. The history is obtained through the patient and review of her electronic health record. She was referred for a history of colon polyps. Colonoscopy was scheduled but cancelled when she came in for the procedure using Plavix. An office appointment was arranged to discuss the risks and benefits of surveillance colonoscopy given Plavix. However, her Plavix has since been discontinued.   Initial screening colonoscopy and EGD with Dr. Muhammad Yunis in [place], [place] 10/03/2012 revealed 6 polyps (3 rectal, 1 sigmoid, one TI, and 1 cecal), diverticulosis, and hemorrhoids.  3 polyps were adenomatous and 3 polyps were hyperplastic.  Gastric biopsy showed mild chronic gastritis with no evidence for H. Pylori.     Past Medical History:  Diagnosis Date  . Allergy    environmental  . Asthma   . CLL (chronic lymphoblastic leukemia)   . Constipation 11/15/2011  . COPD (chronic obstructive pulmonary disease) ([facility])   . Diabetes  mellitus without complication ([facility])   . GERD (gastroesophageal reflux disease)   . Hyperlipidemia   . Hypertension   . PVD (peripheral vascular disease) ([facility])   . Stroke ([facility]) 1998   in 1998 due to tumor-right side    Past Surgical History:  Procedure Laterality Date  . ABI  02/2012   ABI <0.65 BL 02/2012  . BRAIN MENINGIOMA EXCISION    . Bypass grafting of RLE for PAD claudication     . CESAREAN SECTION     1974, 78 ,80  . CHOLECYSTECTOMY, LAPAROSCOPIC    . COLONOSCOPY  2014   [place], [place]  . ESOPHAGEAL DILATION      Current Outpatient Medications  Medication Sig Dispense Refill  . albuterol (ACCUNEB) 0.63 MG/3ML nebulizer solution Take 3 mLs (0.63 mg total) by nebulization every 6 (six) hours as needed for wheezing. 75 mL 12  . Blood Glucose Monitoring Suppl (ONETOUCH VERIO) w/Device KIT 1 Device by Does not apply route daily. 1 kit 0  . cyclobenzaprine (FLEXERIL) 10 MG tablet TAKE ONE TABLET BY MOUTH EVERY DAY AT BEDTIME 30 tablet 2  . diclofenac sodium (VOLTAREN) 1 % GEL Apply 2 g topically 4 (four) times daily. 100 g 2  . DULoxetine (CYMBALTA) 30 MG capsule TAKE 1 CAPSULE BY MOUTH  DAILY 90 capsule 1  . enalapril (VASOTEC) 20 MG tablet Take 1 tablet (20 mg total) by mouth daily. 90 tablet 3  . famotidine (PEPCID) 20 MG tablet TAKE ONE TABLET BY MOUTH EVERY DAY 30 tablet 11  . Fluticasone-Salmeterol (ADVAIR DISKUS) 250-50 MCG/DOSE AEPB Inhale 1 puff into the lungs 2 (two) times daily. 1 each   3  . glucose blood (ONETOUCH VERIO) test strip Use to check blood sugar once a day 100 each 12  . hydrochlorothiazide (MICROZIDE) 12.5 MG capsule Take 1 capsule (12.5 mg total) by mouth daily. 90 capsule 3  . Lancets (ONETOUCH ULTRASOFT) lancets Use to check blood sugar once a day 100 each 12  . metFORMIN (GLUCOPHAGE) 500 MG tablet TAKE 1 TABLET BY MOUTH  TWICE A DAY WITH A MEAL 180 tablet 2  . metoprolol tartrate (LOPRESSOR) 25 MG tablet TAKE ONE HALF TABLET BY MOUTH TWICE DAILY 30  tablet 11  . Misc. Devices (ROLLER WALKER) MISC 1 rolling walker with seat 1 each 0  . montelukast (SINGULAIR) 10 MG tablet TAKE ONE TABLET BY MOUTH AT BEDTIME 30 tablet 11  . nicotine polacrilex (NICORETTE) 4 MG gum Take 1 each (4 mg total) by mouth as needed for smoking cessation. 100 tablet 0  . nitroGLYCERIN (NITROSTAT) 0.4 MG SL tablet DISSOLVE ONE TABLET UNDER THE TONGUE EVERY 5 MINUTES, UP TO 3 DOSES FOR CHEST PAIN 25 tablet 11  . PROAIR HFA 108 (90 Base) MCG/ACT inhaler INHALE 2 PUFFS BY MOUTH EVERY 6 HOURS AS NEEDED FOR WHEEZING OR SHORTNESS OF BREATH 8.5 each 11  . simvastatin (ZOCOR) 20 MG tablet TAKE ONE TABLET BY MOUTH AT BEDTIME 30 tablet 11  . traMADol (ULTRAM) 50 MG tablet TAKE ONE TABLET BY MOUTH EVERY DAY AS NEEDED FOR MODERATE PAIN 30 tablet 5  . umeclidinium bromide (INCRUSE ELLIPTA) 62.5 MCG/INH AEPB Inhale 1 puff into the lungs daily. 4 each 0  . Na Sulfate-K Sulfate-Mg Sulf (SUPREP BOWEL PREP KIT) 17.5-3.13-1.6 GM/177ML SOLN Take 1 kit by mouth as directed. 324 mL 0   No current facility-administered medications for this visit.     Allergies as of 12/01/2018 - Review Complete 12/01/2018  Allergen Reaction Noted  . Aspirin Other (See Comments) 08/30/2007  . Baclofen Other (See Comments) 06/06/2015    Family History  Problem Relation Age of Onset  . Heart disease Mother   . Asthma Mother   . Cancer Mother        uterine   . Depression Mother   . Heart attack Mother 22  . Hyperlipidemia Mother   . Hypertension Mother   . Stroke Mother   . Kidney disease Mother   . Heart attack Sister   . Stroke Sister   . Depression Sister   . Diabetes Sister   . Hyperlipidemia Sister   . Depression Sister   . Diabetes Sister   . Hyperlipidemia Sister   . HIV/AIDS Sister   . Diabetes Brother   . Asthma Brother   . Hypertension Brother   . Cancer Maternal Aunt        lung  . Heart disease Maternal Grandmother   . Heart attack Maternal Grandmother   . Cancer Brother          colon  . HIV/AIDS Brother   . Colon cancer Neg Hx     Social History   Socioeconomic History  . Marital status: Married    Spouse name: Not on file  . Number of children: 3  . Years of education: Not on file  . Highest education level: Not on file  Occupational History  . Occupation: DISABLED    Employer: DISABLED  Social Needs  . Financial resource strain: Not on file  . Food insecurity:    Worry: Not on file    Inability: Not on file  . Transportation needs:  Medical: Not on file    Non-medical: Not on file  Tobacco Use  . Smoking status: Current Some Day Smoker    Packs/day: 0.25    Years: 30.00    Pack years: 7.50    Types: Cigarettes    Start date: 01/01/1976  . Smokeless tobacco: Never Used  Substance and Sexual Activity  . Alcohol use: No    Alcohol/week: 0.0 standard drinks  . Drug use: No  . Sexual activity: Yes  Lifestyle  . Physical activity:    Days per week: Not on file    Minutes per session: Not on file  . Stress: Not on file  Relationships  . Social connections:    Talks on phone: Not on file    Gets together: Not on file    Attends religious service: Not on file    Active member of club or organization: Not on file    Attends meetings of clubs or organizations: Not on file    Relationship status: Not on file  . Intimate partner violence:    Fear of current or ex partner: Not on file    Emotionally abused: Not on file    Physically abused: Not on file    Forced sexual activity: Not on file  Other Topics Concern  . Not on file  Social History Narrative   Health Care POA:    Emergency Contact: Shreeya Recendiz [phone] (c)   End of Life Plan:    Who lives with you: Lives with husband   Any pets: none   Diet: Patient lacks financial resources for much food. Pt reports eating what is available.   Exercise: Patient does not have an exercise plan.   Seatbelts: Patient reports wearing seatbelt when in vehicle.    [name] Exposure/Protection:    Hobbies: Bowling, computer games, Bingo      Has financial difficulties and transportation issues as she and her husband share transportation        Review of Systems: 12 system ROS is negative except as noted above.  Filed Weights   12/01/18 1401  Weight: 122 lb (55.3 kg)    Physical Exam: Vital signs were reviewed. General:   Alert, well-nourished, pleasant and cooperative in NAD Head:  Normocephalic and atraumatic. Eyes:  Sclera clear, no icterus.   Conjunctiva pink. Mouth:  No deformity or lesions.   Neck:  Supple; no thyromegaly. Lungs:  Clear throughout to auscultation.   No wheezes.  Heart:  Regular rate and rhythm; no murmurs Abdomen:  Soft, nontender, normal bowel sounds. No rebound or guarding. No hepatosplenomegaly Rectal:  Deferred  Msk:  Symmetrical without gross deformities. Extremities:  No gross deformities or edema. Neurologic:  Alert and  oriented x4;  grossly nonfocal Skin:  No rash or bruise. Psych:  Alert and cooperative. Normal mood and affect.   Niva Murren L. Tarri Glenn, MD, MPH [facility] 12/01/2018, 2:58 PM

## 2018-12-01 NOTE — Patient Instructions (Addendum)
You have been scheduled for a colonoscopy. Please follow written instructions given to you at your visit today.  Please pick up your prep supplies at the pharmacy within the next 1-3 days. If you use inhalers (even only as needed), please bring them with you on the day of your procedure. Your physician has requested that you go to www.startemmi.com and enter the access code given to you at your visit today. This web site gives a general overview about your procedure. However, you should still follow specific instructions given to you by our office regarding your preparation for the procedure.  Tips for colonoscopy:  -STAY WELL HYDRATED FOR 3-4 DAYS PRIOR TO THE EXAM. This reduces nausea and dehydration.  -TO PREVENT SKIN/HEMORRHOID IRRITATION- prior to wiping, put A&Dointment or vaseline on the toilet paper. -Keep a towel or pad on the bed.  -DRINK 64oz of clear liquids in the morning of prep day (PRIOR TO STARTING THE PREP) to be sure that there is enough fluid to flush the colon and stay hydrated!!!! This is in addition to the fluids required for preparation.  

## 2018-12-04 ENCOUNTER — Encounter: Payer: Self-pay | Admitting: Gastroenterology

## 2018-12-04 ENCOUNTER — Ambulatory Visit (AMBULATORY_SURGERY_CENTER): Payer: Medicare Other | Admitting: Gastroenterology

## 2018-12-04 VITALS — BP 170/76 | HR 65 | Temp 98.6°F | Resp 16 | Ht 62.0 in | Wt 122.0 lb

## 2018-12-04 DIAGNOSIS — K635 Polyp of colon: Secondary | ICD-10-CM

## 2018-12-04 DIAGNOSIS — K219 Gastro-esophageal reflux disease without esophagitis: Secondary | ICD-10-CM | POA: Diagnosis not present

## 2018-12-04 DIAGNOSIS — Z86010 Personal history of colonic polyps: Secondary | ICD-10-CM | POA: Diagnosis not present

## 2018-12-04 DIAGNOSIS — D123 Benign neoplasm of transverse colon: Secondary | ICD-10-CM

## 2018-12-04 DIAGNOSIS — K2970 Gastritis, unspecified, without bleeding: Secondary | ICD-10-CM

## 2018-12-04 MED ORDER — SODIUM CHLORIDE 0.9 % IV SOLN: 500.0000 mL | Freq: Once | INTRAVENOUS | Status: DC

## 2018-12-04 NOTE — Progress Notes (Signed)
Report to PACU, RN, vss, BBS= Clear.  

## 2018-12-04 NOTE — Patient Instructions (Signed)
YOU HAD AN ENDOSCOPIC PROCEDURE TODAY AT THE [facility]:   Refer to the procedure report that was given to you for any specific questions about what was found during the examination.  If the procedure report does not answer your questions, please call your gastroenterologist to clarify.  If you requested that your care partner not be given the details of your procedure findings, then the procedure report has been included in a sealed envelope for you to review at your convenience later.  YOU SHOULD EXPECT: Some feelings of bloating in the abdomen. Passage of more gas than usual.  Walking can help get rid of the air that was put into your GI tract during the procedure and reduce the bloating. If you had a lower endoscopy (such as a colonoscopy or flexible sigmoidoscopy) you may notice spotting of blood in your stool or on the toilet paper. If you underwent a bowel prep for your procedure, you may not have a normal bowel movement for a few days.  Please Note:  You might notice some irritation and congestion in your nose or some drainage.  This is from the oxygen used during your procedure.  There is no need for concern and it should clear up in a day or so.  SYMPTOMS TO REPORT IMMEDIATELY:   Following lower endoscopy (colonoscopy or flexible sigmoidoscopy):  Excessive amounts of blood in the stool  Significant tenderness or worsening of abdominal pains  Swelling of the abdomen that is new, acute  Fever of 100F or higher  For urgent or emergent issues, a gastroenterologist can be reached at any hour by calling [phone].   DIET:  We do recommend a small meal at first, but then you may proceed to your regular diet.  Drink plenty of fluids but you should avoid alcoholic beverages for 24 hours.  ACTIVITY:  You should plan to take it easy for the rest of today and you should NOT DRIVE or use heavy machinery until tomorrow (because of the sedation medicines used during the test).     FOLLOW UP: Our staff will call the number listed on your records the next business day following your procedure to check on you and address any questions or concerns that you may have regarding the information given to you following your procedure. If we do not reach you, we will leave a message.  However, if you are feeling well and you are not experiencing any problems, there is no need to return our call.  We will assume that you have returned to your regular daily activities without incident.  If any biopsies were taken you will be contacted by phone or by letter within the next 1-3 weeks.  Please call us at [phone] if you have not heard about the biopsies in 3 weeks.   Await for biopsy results Polyps (handout given) Diverticulosis (handout given) Hemorrhoids (handout given)   SIGNATURES/CONFIDENTIALITY: You and/or your care partner have signed paperwork which will be entered into your electronic medical record.  These signatures attest to the fact that that the information above on your After Visit Summary has been reviewed and is understood.  Full responsibility of the confidentiality of this discharge information lies with you and/or your care-partner. 

## 2018-12-04 NOTE — Progress Notes (Signed)
Called to room to assist during endoscopic procedure.  Patient ID and intended procedure confirmed with present staff. Received instructions for my participation in the procedure from the performing physician.  

## 2018-12-04 NOTE — Op Note (Addendum)
[facility] Patient Name: [name] [name] Procedure Date: 12/04/2018 9:50 AM MRN: [id number] Endoscopist: Thornton Park MD, MD Age: 72 Referring MD:  Date of Birth: 09/21/1946 Gender: Female Account #: [id number] Procedure:                Colonoscopy Indications:              Surveillance: Personal history of adenomatous                            polyps on last colonoscopy > 5 years ag; (3                            adenomatous and 3 hyperplastic polyps 10/03/12 (Dr.                            Kathrin Penner, [place])). No known family history of                            colon cancer or polyps. No baseline GI symptoms. Medicines:                See the Anesthesia note for documentation of the                            administered medications Procedure:                Pre-Anesthesia Assessment:                           - Prior to the procedure, a History and Physical                            was performed, and patient medications and                            allergies were reviewed. The patient's tolerance of                            previous anesthesia was also reviewed. The risks                            and benefits of the procedure and the sedation                            options and risks were discussed with the patient.                            All questions were answered, and informed consent                            was obtained. Prior Anticoagulants: The patient has                            taken no previous anticoagulant or antiplatelet  agents. ASA Grade Assessment: III - A patient with                            severe systemic disease. After reviewing the risks                            and benefits, the patient was deemed in                            satisfactory condition to undergo the procedure.                           After obtaining informed consent, the colonoscope                            was passed under  direct vision. Throughout the                            procedure, the patient's blood pressure, pulse, and                            oxygen saturations were monitored continuously. The                            Colonoscope was introduced through the anus and                            advanced to the the terminal ileum, with                            identification of the appendiceal orifice and IC                            valve. The colonoscopy was performed without                            difficulty. The patient tolerated the procedure                            well. The quality of the bowel preparation was good. Scope In: 9:57:07 AM Scope Out: 10:09:51 AM Scope Withdrawal Time: 0 hours 9 minutes 54 seconds  Total Procedure Duration: 0 hours 12 minutes 44 seconds  Findings:                 The perianal and digital rectal examinations were                            normal.                           A few small and large-mouthed diverticula were                            found in the sigmoid colon and descending colon.  Two sessile polyps were found in the hepatic                            flexure. The polyps were 2 to 5 mm in size. These                            polyps were removed with a cold snare. Resection                            and retrieval were complete.                           Internal hemorrhoids were found.                           The exam was otherwise without abnormality on                            direct and retroflexion views. Complications:            No immediate complications. Estimated Blood Loss:     Estimated blood loss: none. Impression:               - Diverticulosis in the sigmoid colon and in the                            descending colon.                           - Two 2 to 5 mm polyps at the hepatic flexure,                            removed with a cold snare. Resected and retrieved.                            - The examination was otherwise normal on direct                            and retroflexion views. Recommendation:           - Discharge patient to home.                           - Resume regular diet.                           - Continue present medications.                           - Await pathology results.                           - Repeat colonoscopy in 5 years for surveillance if                            clinically appropriate at that time. Will plan an  office visit to discuss prior to scheduling. Thornton Park MD, MD 12/04/2018 10:15:37 AM This report has been signed electronically.

## 2018-12-05 ENCOUNTER — Telehealth: Payer: Self-pay | Admitting: *Deleted

## 2018-12-05 NOTE — Telephone Encounter (Signed)
  Follow up Call-  Call back number 12/04/2018  Post procedure Call Back phone  # [phone]  Permission to leave phone message No  Some recent data might be hidden     Patient questions:  Do you have a fever, pain , or abdominal swelling? Yes.   Pain Score  2 *  Have you tolerated food without any problems? Yes.    Have you been able to return to your normal activities? Yes.    Do you have any questions about your discharge instructions: Diet   Yes.   Medications  No. Follow up visit  No.  Do you have questions or concerns about your Care? No.  Actions: * If pain score is 4 or above: No action needed, pain <4.

## 2018-12-07 ENCOUNTER — Encounter: Payer: Self-pay | Admitting: Gastroenterology

## 2018-12-08 ENCOUNTER — Ambulatory Visit
Admission: RE | Admit: 2018-12-08 | Discharge: 2018-12-08 | Disposition: A | Discharge status: Home / Self Care | Payer: Medicare Other | Source: Ambulatory Visit | Attending: Family Medicine | Admitting: Family Medicine

## 2018-12-08 DIAGNOSIS — E2839 Other primary ovarian failure: Secondary | ICD-10-CM

## 2018-12-08 DIAGNOSIS — Z1231 Encounter for screening mammogram for malignant neoplasm of breast: Secondary | ICD-10-CM | POA: Diagnosis not present

## 2018-12-08 DIAGNOSIS — Z13820 Encounter for screening for osteoporosis: Secondary | ICD-10-CM | POA: Diagnosis not present

## 2018-12-08 DIAGNOSIS — Z1239 Encounter for other screening for malignant neoplasm of breast: Secondary | ICD-10-CM

## 2018-12-08 DIAGNOSIS — Z780 Asymptomatic menopausal state: Secondary | ICD-10-CM | POA: Diagnosis not present

## 2018-12-26 ENCOUNTER — Other Ambulatory Visit: Payer: Self-pay | Admitting: Family Medicine

## 2018-12-26 ENCOUNTER — Other Ambulatory Visit: Payer: Self-pay | Admitting: Internal Medicine

## 2019-01-05 ENCOUNTER — Other Ambulatory Visit: Payer: Self-pay | Admitting: Family Medicine

## 2019-01-10 ENCOUNTER — Ambulatory Visit (INDEPENDENT_AMBULATORY_CARE_PROVIDER_SITE_OTHER): Payer: Medicare Other | Admitting: Family Medicine

## 2019-01-10 DIAGNOSIS — M25551 Pain in right hip: Secondary | ICD-10-CM | POA: Diagnosis not present

## 2019-01-10 DIAGNOSIS — E1151 Type 2 diabetes mellitus with diabetic peripheral angiopathy without gangrene: Secondary | ICD-10-CM | POA: Diagnosis not present

## 2019-01-10 DIAGNOSIS — G8929 Other chronic pain: Secondary | ICD-10-CM

## 2019-01-10 DIAGNOSIS — J449 Chronic obstructive pulmonary disease, unspecified: Secondary | ICD-10-CM | POA: Diagnosis not present

## 2019-01-10 MED ORDER — METFORMIN HCL 500 MG PO TABS: 500.0000 mg | ORAL_TABLET | Freq: Every day | ORAL | 3 refills | Status: DC

## 2019-01-10 NOTE — Progress Notes (Signed)
Subjective:    Patient ID: [name] [name], female    DOB: February 05, 1946, 73 y.o.   MRN: [id number]   CC: Follow-up COPD  HPI:  COPD Patient reports that she has only had to use her nebulizer a few times since she received it.  States that she is much improved after using Advair and Incruse daily.  Patient denies any worsening shortness of breath.  Reports that she has intermittent cough but it is not daily.  Also nonproductive cough.  Patient reports that she is now smoking about 3 cigarettes/day.  Again states that she only smokes when "my husband gets on her nerves."  Right leg and thigh pain Patient with continued right thigh pain which has somewhat worsened since previous.  Patient is to follow-up with her vascular surgeon in May as this is likely cause of her pain.  Diabetes:  Last A1c 6.2 on 09/29/2018 Taking medications: metformin 500 mg twice daily. On statin Last eye exam:  States that she will make an appointment with her ophthalmologist soon. Last foot exam:  Performed today ROS: denies dizziness, diaphoresis, LOC, polyuria, polydipsia  Smoking status reviewed  ROS: 10 point ROS is otherwise negative, except as mentioned in HPI  Patient Active Problem List   Diagnosis Date Noted  . Pulmonary nodules 01/04/2018  . Mass of left lower leg 01/04/2018  . Chronic kidney disease (CKD), stage III (moderate) ([place]) 06/04/2016  . Shoulder pain, left 04/06/2016  . Chronic back pain 10/02/2015  . Chronic right hip pain 03/13/2012  . Episodic tension type headache 09/24/2010  . GAIT IMBALANCE 12/10/2009  . DM (diabetes mellitus), type 2 with peripheral vascular complications ([place]) 56/38/7564  . Chronic lymphocytic leukemia ([place]) 04/11/2009  . PERIPHERAL VASCULAR DISEASE 10/13/2007  . HYPERCHOLESTEROLEMIA 02/23/2007  . ANXIETY 02/23/2007  . TOBACCO DEPENDENCE 02/23/2007  . HYPERTENSION, BENIGN SYSTEMIC 02/23/2007  . COPD (chronic obstructive pulmonary disease) ([place]) 02/23/2007    . GASTROESOPHAGEAL REFLUX, NO ESOPHAGITIS 02/23/2007     Objective:  BP (!) 145/80   Pulse 63   Temp 98.4 F (36.9 C) (Oral)   Wt 125 lb 12.8 oz (57.1 kg)   SpO2 96%   BMI 23.01 kg/m  Vitals and nursing note reviewed  General: NAD, pleasant Cardiac: RRR, normal heart sounds, no murmurs Respiratory: CTAB, normal effort Extremities: no edema or cyanosis. WWP. Skin: warm and dry, no rashes noted Neuro: alert and oriented, no focal deficits Psych: normal affect  Diabetic Foot Exam - Simple   Simple Foot Form Visual Inspection No deformities, no ulcerations, no other skin breakdown bilaterally:  Yes Sensation Testing Intact to touch and monofilament testing bilaterally:  Yes Pulse Check Comments Posterior tibialis and dorsalis pedis palpable with 2+ on left; posterior tibialis palpable on right unable to palpate dorsalis pedis on right Patient with onychomycosis of great toe on right     Assessment & Plan:    Chronic right hip pain Patient with continued right hip pain which she uses tramadol for but not every day.  Patient states that her pain has slowly gotten worse.  -On foot exam today patient with no palpable pulses on right dorsalis pedis and faint posterior tibialis.  Patient previously evaluated by vascular in May 2019.  Patient to schedule follow-up with them in order to evaluate her hip pain further.  COPD (chronic obstructive pulmonary disease) ([place]) Patient has now decreased to 3 cigarettes/day.  States that her breathing has improved immensely since starting Advair and and Incruse daily.  Patient  reports that she has not had to use her nebulizer. -Continue current medications -Counseled patient on importance of smoking cessation  DM (diabetes mellitus), type 2 with peripheral vascular complications ([place]) Patient's last A1c is 6.2.  Discussed option of discontinuing patient's long-term metformin with patient given her most recent GFR of 46.  Patient currently  on 500 mg twice daily.  Patient does not feel comfortable completely stopping her metformin but is agreeable to lowering dose to 500 mg once daily.  Discussed risk versus benefit. -Decrease metformin to 500 mg once daily -Patient to schedule ophthalmology exam.    [place] Ilyanna Baillargeon, DO Family Medicine Resident PGY-2

## 2019-01-10 NOTE — Patient Instructions (Signed)
Thank you for coming to see me today. It was a pleasure! Today we talked about:   Your breathing, please continue your current medications. Please follow up with an eye doctor.   You may start taking your metformin once per day with breakfast rather than twice daily.   Please follow-up with me in 3 months or sooner as needed.  If you have any questions or concerns, please do not hesitate to call the office at [phone].  Take Care,   [name] Aleah Ahlgrim, DO  Health Maintenance After Age 23 After age 25, you are at a higher risk for certain long-term diseases and infections as well as injuries from falls. Falls are a major cause of broken bones and head injuries in people who are older than age 45. Getting regular preventive care can help to keep you healthy and well. Preventive care includes getting regular testing and making lifestyle changes as recommended by your health care provider. Talk with your health care provider about:  Which screenings and tests you should have. A screening is a test that checks for a disease when you have no symptoms.  A diet and exercise plan that is right for you. What should I know about screenings and tests to prevent falls? Screening and testing are the best ways to find a health problem early. Early diagnosis and treatment give you the best chance of managing medical conditions that are common after age 72. Certain conditions and lifestyle choices may make you more likely to have a fall. Your health care provider may recommend:  Regular vision checks. Poor vision and conditions such as cataracts can make you more likely to have a fall. If you wear glasses, make sure to get your prescription updated if your vision changes.  Medicine review. Work with your health care provider to regularly review all of the medicines you are taking, including over-the-counter medicines. Ask your health care provider about any side effects that may make you more likely to  have a fall. Tell your health care provider if any medicines that you take make you feel dizzy or sleepy.  Osteoporosis screening. Osteoporosis is a condition that causes the bones to get weaker. This can make the bones weak and cause them to break more easily.  Blood pressure screening. Blood pressure changes and medicines to control blood pressure can make you feel dizzy.  Strength and balance checks. Your health care provider may recommend certain tests to check your strength and balance while standing, walking, or changing positions.  Foot health exam. Foot pain and numbness, as well as not wearing proper footwear, can make you more likely to have a fall.  Depression screening. You may be more likely to have a fall if you have a fear of falling, feel emotionally low, or feel unable to do activities that you used to do.  Alcohol use screening. Using too much alcohol can affect your balance and may make you more likely to have a fall. What actions can I take to lower my risk of falls? General instructions  Talk with your health care provider about your risks for falling. Tell your health care provider if: ? You fall. Be sure to tell your health care provider about all falls, even ones that seem minor. ? You feel dizzy, sleepy, or off-balance.  Take over-the-counter and prescription medicines only as told by your health care provider. These include any supplements.  Eat a healthy diet and maintain a healthy weight. A healthy diet includes  low-fat dairy products, low-fat (lean) meats, and fiber from whole grains, beans, and lots of fruits and vegetables. Home safety  Remove any tripping hazards, such as rugs, cords, and clutter.  Install safety equipment such as grab bars in bathrooms and safety rails on stairs.  Keep rooms and walkways well-lit. Activity   Follow a regular exercise program to stay fit. This will help you maintain your balance. Ask your health care provider what  types of exercise are appropriate for you.  If you need a cane or walker, use it as recommended by your health care provider.  Wear supportive shoes that have nonskid soles. Lifestyle  Do not drink alcohol if your health care provider tells you not to drink.  If you drink alcohol, limit how much you have: ? 0-1 drink a day for women. ? 0-2 drinks a day for men.  Be aware of how much alcohol is in your drink. In the [place] one drink equals one typical bottle of beer (12 oz), one-half glass of wine (5 oz), or one shot of hard liquor (1 oz).  Do not use any products that contain nicotine or tobacco, such as cigarettes and e-cigarettes. If you need help quitting, ask your health care provider. Summary  Having a healthy lifestyle and getting preventive care can help to protect your health and wellness after age 75.  Screening and testing are the best way to find a health problem early and help you avoid having a fall. Early diagnosis and treatment give you the best chance for managing medical conditions that are more common for people who are older than age 72.  Falls are a major cause of broken bones and head injuries in people who are older than age 67. Take precautions to prevent a fall at home.  Work with your health care provider to learn what changes you can make to improve your health and wellness and to prevent falls. This information is not intended to replace advice given to you by your health care provider. Make sure you discuss any questions you have with your health care provider. Document Released: 10/26/2017 Document Revised: 10/26/2017 Document Reviewed: 10/26/2017 Elsevier Interactive Patient Education  2019 [employer].

## 2019-01-11 NOTE — Assessment & Plan Note (Addendum)
Patient with continued right hip pain which she uses tramadol for but not every day.  Patient states that her pain has slowly gotten worse.  -On foot exam today patient with no palpable pulses on right dorsalis pedis and faint posterior tibialis.  Patient previously evaluated by vascular in May 2019.  Patient to schedule follow-up with them in order to evaluate her hip pain further.

## 2019-01-11 NOTE — Assessment & Plan Note (Addendum)
Patient has now decreased to 3 cigarettes/day.  States that her breathing has improved immensely since starting Advair and and Incruse daily.  Patient reports that she has not had to use her nebulizer. -Continue current medications -Counseled patient on importance of smoking cessation

## 2019-01-11 NOTE — Assessment & Plan Note (Addendum)
Patient's last A1c is 6.2.  Discussed option of discontinuing patient's long-term metformin with patient given her most recent GFR of 46.  Patient currently on 500 mg twice daily.  Patient does not feel comfortable completely stopping her metformin but is agreeable to lowering dose to 500 mg once daily.  Discussed risk versus benefit. -Decrease metformin to 500 mg once daily -Patient to schedule ophthalmology exam.

## 2019-01-13 ENCOUNTER — Other Ambulatory Visit: Payer: Self-pay | Admitting: Internal Medicine

## 2019-01-28 ENCOUNTER — Other Ambulatory Visit: Payer: Self-pay | Admitting: Internal Medicine

## 2019-03-01 ENCOUNTER — Telehealth: Payer: Self-pay | Admitting: Family Medicine

## 2019-03-01 NOTE — Telephone Encounter (Signed)
[employer] placard form dropped off at front desk for completion.  Verified that patient section of form has been completed.  Last DOS/WCC with PCP was 01/10/2019.  Placed form in team folder to be completed by clinical staff.  [name] [name]

## 2019-03-01 NOTE — Telephone Encounter (Signed)
Patient has appointment on 03/09/2019, will discuss form with her at that time.

## 2019-03-01 NOTE — Telephone Encounter (Signed)
Clinical info completed on handicap form.  Place form in Dr. Bonnita Levan box for completion.  [name] [name], [name]

## 2019-03-09 ENCOUNTER — Ambulatory Visit (INDEPENDENT_AMBULATORY_CARE_PROVIDER_SITE_OTHER): Payer: Medicare Other | Admitting: Family Medicine

## 2019-03-09 ENCOUNTER — Telehealth: Payer: Self-pay | Admitting: Family Medicine

## 2019-03-09 ENCOUNTER — Other Ambulatory Visit: Payer: Self-pay

## 2019-03-09 ENCOUNTER — Encounter: Payer: Self-pay | Admitting: Family Medicine

## 2019-03-09 VITALS — BP 142/72 | HR 53 | Temp 98.6°F | Ht 62.0 in | Wt 126.8 lb

## 2019-03-09 DIAGNOSIS — I739 Peripheral vascular disease, unspecified: Secondary | ICD-10-CM | POA: Diagnosis not present

## 2019-03-09 DIAGNOSIS — C9110 Chronic lymphocytic leukemia of B-cell type not having achieved remission: Secondary | ICD-10-CM

## 2019-03-09 DIAGNOSIS — E7800 Pure hypercholesterolemia, unspecified: Secondary | ICD-10-CM

## 2019-03-09 DIAGNOSIS — M549 Dorsalgia, unspecified: Secondary | ICD-10-CM | POA: Diagnosis not present

## 2019-03-09 DIAGNOSIS — E1151 Type 2 diabetes mellitus with diabetic peripheral angiopathy without gangrene: Secondary | ICD-10-CM

## 2019-03-09 DIAGNOSIS — I10 Essential (primary) hypertension: Secondary | ICD-10-CM

## 2019-03-09 DIAGNOSIS — N183 Chronic kidney disease, stage 3 unspecified: Secondary | ICD-10-CM

## 2019-03-09 DIAGNOSIS — G8929 Other chronic pain: Secondary | ICD-10-CM

## 2019-03-09 LAB — POCT GLYCOSYLATED HEMOGLOBIN (HGB A1C): HbA1c, POC (controlled diabetic range): 6.6 % (ref 0.0–7.0)

## 2019-03-09 MED ORDER — LIDO-CAPSAICIN-MEN-METHYL SAL 0.5-0.035-5-20 % EX PTCH: 1.0000 | MEDICATED_PATCH | Freq: Every day | CUTANEOUS | 1 refills | Status: DC

## 2019-03-09 NOTE — Telephone Encounter (Signed)
After Hours Emergency Line   Received after hours call from [facility] regarding critical lab value: WBC 68.   Chart reviewed, however am unable to see patient's note from today.  Per labs in past patient appears to chronically have elevated white counts ranging from 60-80.  Contacted PCP Dr. Enid Derry this evening whom the patient saw today to briefly discuss.  She appeared well at today's visit and follows with oncology.  Has had longstanding h/o elevated WBC and no further intervention needed at this time.   Appreciate information provided from primary care physician., will route as FYI.   Lovenia Kim MD

## 2019-03-09 NOTE — Progress Notes (Deleted)
  Subjective:  Patient ID: [name] [name]  DOB: [birth year] MRN: [id number]  [name] [name] is a 73 y.o. female with a PMH of ***, here today for ***.   HPI:  ***  ***ROS: All other systems otherwise negative, except as mentioned in HPI  Family hx: ***  Social hx: ***Denies use of illicit drugs, alcohol use Smoking status reviewed  Patient Active Problem List   Diagnosis Date Noted  . Pulmonary nodules 01/04/2018  . Mass of left lower leg 01/04/2018  . Chronic kidney disease (CKD), stage III (moderate) ([place]) 06/04/2016  . Shoulder pain, left 04/06/2016  . Chronic back pain 10/02/2015  . Chronic right hip pain 03/13/2012  . Episodic tension type headache 09/24/2010  . GAIT IMBALANCE 12/10/2009  . DM (diabetes mellitus), type 2 with peripheral vascular complications ([place]) 51/89/8421  . Chronic lymphocytic leukemia ([place]) 04/11/2009  . PERIPHERAL VASCULAR DISEASE 10/13/2007  . HYPERCHOLESTEROLEMIA 02/23/2007  . ANXIETY 02/23/2007  . TOBACCO DEPENDENCE 02/23/2007  . HYPERTENSION, BENIGN SYSTEMIC 02/23/2007  . COPD (chronic obstructive pulmonary disease) ([place]) 02/23/2007  . GASTROESOPHAGEAL REFLUX, NO ESOPHAGITIS 02/23/2007     Objective:  BP (!) 142/72   Pulse (!) 53   Temp 98.6 F (37 C) (Oral)   Ht 5\' 2"  (1.575 m)   Wt 126 lb 12.8 oz (57.5 kg)   SpO2 95%   BMI 23.19 kg/m   Vitals and nursing note reviewed  General: NAD, pleasant Cardiac: RRR, normal heart sounds, no m/r/g Pulm: normal effort, CTAB ***GI: soft, nontender, nondistended Extremities: no edema or cyanosis. WWP. Skin: warm and dry, no rashes noted Neuro: alert and oriented, no focal deficits Psych: normal affect, normal thought content  Assessment & Plan:   No problem-specific Assessment & Plan notes found for this encounter.   [place] Thurl Boen, DO Family Medicine Resident PGY-2

## 2019-03-09 NOTE — Progress Notes (Signed)
Subjective:  Patient ID: [name] [name]  DOB: 1946/05/30 MRN: [id number]  [name] [name] is a 73 y.o. female with a PMH of CKD stage III, PVD, CLL, T2DM, HTN, HLD, tobacco use disorder, COPD, h/o CVA, GERD, here today for follow-up for back pain, diabetic, htn checkup.   HPI:  Diabetes:  Last A1c 6.2 on 09/2018 Taking medications: metformin 500 mg daily On Plavix, and on statin Last eye exam:  Patient would like referral to see an ophthalmologist and has not seen one in many years Last foot exam: up to date ROS: denies dizziness, diaphoresis, LOC, polyuria, polydipsia  Hypertension: - Medications: Hydrochlorothiazide 12.5, metoprolol 25 mg, enalapril 20 mg, - Compliance: yes - Checking BP at home: No - Denies any SOB, CP, vision changes, LE edema, medication SEs, or symptoms of hypotension - Diet: States that she tries to have a low-sodium diet - Exercise: Patient unable to do much exercise due to pain in her hip and back  Low back pain Patient reports that she has had this back pain for many years and has been stable on the tramadol 50 at night however she reports that over the past few months it has been getting increasingly worse especially in her right hip.  Patient states that she believes it could be related to her vasculature as it is worse when she is getting up and walking and she has had the same pain before.  Patient states that she has not noticed any coldness in her extremities, denies any numbness or tingling, denies any bowel or bladder incontinence.  Patient states that she has not had any recent trauma to her back.  Patient requesting handicap placard form be filled out today.  ROS: All other systems otherwise negative, except as mentioned in HPI  Family hx: Mom with heart disease, asthma, stroke, CKD  Social hx: Denies use of illicit drugs, alcohol use Smoking status reviewed-patient reports she is still struggling to try to stop smoking  Patient Active Problem  List   Diagnosis Date Noted  . Pulmonary nodules 01/04/2018  . Mass of left lower leg 01/04/2018  . Chronic kidney disease (CKD), stage III (moderate) ([place]) 06/04/2016  . Chronic back pain 10/02/2015  . Chronic right hip pain 03/13/2012  . Episodic tension type headache 09/24/2010  . GAIT IMBALANCE 12/10/2009  . DM (diabetes mellitus), type 2 with peripheral vascular complications ([place]) 25/95/6387  . Chronic lymphocytic leukemia ([place]) 04/11/2009  . PERIPHERAL VASCULAR DISEASE 10/13/2007  . HYPERCHOLESTEROLEMIA 02/23/2007  . ANXIETY 02/23/2007  . TOBACCO DEPENDENCE 02/23/2007  . HYPERTENSION, BENIGN SYSTEMIC 02/23/2007  . COPD (chronic obstructive pulmonary disease) ([place]) 02/23/2007  . GASTROESOPHAGEAL REFLUX, NO ESOPHAGITIS 02/23/2007     Objective:  BP (!) 142/72   Pulse (!) 53   Temp 98.6 F (37 C) (Oral)   Ht 5\' 2"  (1.575 m)   Wt 126 lb 12.8 oz (57.5 kg)   SpO2 95%   BMI 23.19 kg/m   Vitals and nursing note reviewed  General: NAD, pleasant, using cane to walk Cardiac: RRR, normal heart sounds, no m/r/g Pulm: normal effort, CTAB Extremities: no edema or cyanosis. WWP. Skin: warm and dry, no rashes noted Neuro: alert and oriented, no focal deficits Psych: normal affect, normal thought content  Diabetic Foot Exam - Simple   Simple Foot Form Visual Inspection No deformities, no ulcerations, no other skin breakdown bilaterally:  Yes Sensation Testing Intact to touch and monofilament testing bilaterally:  Yes Pulse Check See comments:  Yes  Comments Nonpalpable posterior tibialis and dorsalis pedis bilaterally    Assessment & Plan:   DM (diabetes mellitus), type 2 with peripheral vascular complications (HCC) Patient's A1c increased to 6.6 after decreasing metformin to 500 once daily. Called patients and discussed options and she would like to go back on her metformin 500 mg twice daily.  Patient's GFR has been stable at 86. -Place referral for patient to be seen  by ophthalmology as she has not had a diabetic eye exam in the past few years.  Chronic back pain Patient has previously been stable on the tramadol 50mg  nightly and Cymbalta 30mg .  States that she has not been using the Voltaren gel as she had not gotten it from her pharmacy yet.  She also states that the Flexeril helped however over the past few months her back pain has been worsening.  Patient has not had an x-ray of her back in many years, will have follow-up DG lumbar spine in order to evaluate for worsening back pain. -Patient also instructed to use salon pause and given lidocaine patches that she may try over the area of her back in order to see if this helps.  Chronic kidney disease (CKD), stage III (moderate) (HCC) GFR stable at 46 today.  On chart review appears that patient has not been seen by nephrology, may need to consider if patient's kidney function continues to decrease.  Patient instructed to avoid NSAIDs or other nephrotoxic agents.    PERIPHERAL VASCULAR DISEASE Patient given information or to call and schedule follow-up appointment for 1 year as instructed and given she is having worsening hip pain.  Bilateral pedal pulses not palpable on exam.  No signs of ischemia and patient only mildly symptomatic at this time.  HYPERTENSION, BENIGN SYSTEMIC BP 142/72 today.  Patient reports compliance with her metoprolol, hydrochlorothiazide and enalapril.   -CMP grossly normal with stable CKD stage IIIa  HYPERCHOLESTEROLEMIA Patient remains controlled on Zocor 20 mg.  Patient is to call and schedule f/u appointment with vascular surgeon within the next few months for her PVD.  Chronic lymphocytic leukemia ([place]) Patient has appointment with oncology on 03/29/2019.  -CBC with hemoglobin of 11.0 and WBC of 68.8-called to inform patient of these results and encouraged her to ensure that she follows up with oncology.  Completed 6 months handicap placard for patient given that she must use  a cane or walker in order to ambulate.  [place] Symone Cornman, DO Family Medicine Resident PGY-2

## 2019-03-09 NOTE — Patient Instructions (Addendum)
Thank you for coming to see me today. It was a pleasure!   If there are any abnormal results on today's labs we will call you.  Otherwise check your MyChart.  You may use salon pause and Voltaren gel in order to help with your low back pain.  Please go have your x-rays done and I will call you with these results.  Please schedule a follow up with your vascular surgeon in 2 months:  Harold Barban, MD [address] [place], [place] [address] [phone]   Please return in 1 month for a lab visit to have your A1c collected and I will call you with those results.  I have placed a referral for an eye doctor.  Please answer their calls and schedule an appointment with your eye doctor.  I have sent a vaccination for tetanus to your pharmacy, please have this done at your earliest convenience.  Please follow-up with me in 3 months or sooner as needed.  If you have any questions or concerns, please do not hesitate to call the office at [phone].  Take Care,   [place] Latash Nouri, DO

## 2019-03-10 LAB — CBC
Hematocrit: 35.5 % (ref 34.0–46.6)
Hemoglobin: 11 g/dL — ABNORMAL LOW (ref 11.1–15.9)
MCH: 26.6 pg (ref 26.6–33.0)
MCHC: 31 g/dL — ABNORMAL LOW (ref 31.5–35.7)
MCV: 86 fL (ref 79–97)
Platelets: 247 10*3/uL (ref 150–450)
RBC: 4.14 x10E6/uL (ref 3.77–5.28)
RDW: 16 % — ABNORMAL HIGH (ref 11.7–15.4)
WBC: 68.8 10*3/uL (ref 3.4–10.8)

## 2019-03-10 LAB — COMPREHENSIVE METABOLIC PANEL
ALT: 11 IU/L (ref 0–32)
AST: 19 IU/L (ref 0–40)
Albumin/Globulin Ratio: 1.8 (ref 1.2–2.2)
Albumin: 4 g/dL (ref 3.7–4.7)
Alkaline Phosphatase: 105 IU/L (ref 39–117)
BUN/Creatinine Ratio: 10 — ABNORMAL LOW (ref 12–28)
BUN: 13 mg/dL (ref 8–27)
Bilirubin Total: <0.2 mg/dL (ref 0.0–1.2)
CO2: 28 mmol/L (ref 20–29)
Calcium: 9.2 mg/dL (ref 8.7–10.3)
Chloride: 102 mmol/L (ref 96–106)
Creatinine, Ser: 1.34 mg/dL — ABNORMAL HIGH (ref 0.57–1.00)
GFR calc Af Amer: 46 mL/min/{1.73_m2} — ABNORMAL LOW (ref >59)
GFR calc non Af Amer: 40 mL/min/{1.73_m2} — ABNORMAL LOW (ref >59)
Globulin, Total: 2.2 g/dL (ref 1.5–4.5)
Glucose: 125 mg/dL — ABNORMAL HIGH (ref 65–99)
Potassium: 4.7 mmol/L (ref 3.5–5.2)
Sodium: 141 mmol/L (ref 134–144)
Total Protein: 6.2 g/dL (ref 6.0–8.5)

## 2019-03-10 LAB — LIPID PANEL
Chol/HDL Ratio: 3.5 ratio (ref 0.0–4.4)
Cholesterol, Total: 132 mg/dL (ref 100–199)
HDL: 38 mg/dL — ABNORMAL LOW (ref >39)
LDL Calculated: 64 mg/dL (ref 0–99)
Triglycerides: 151 mg/dL — ABNORMAL HIGH (ref 0–149)
VLDL Cholesterol Cal: 30 mg/dL (ref 5–40)

## 2019-03-14 ENCOUNTER — Telehealth: Payer: Self-pay

## 2019-03-14 MED ORDER — LIDO-CAPSAICIN-MEN-METHYL SAL 0.5-0.035-5-20 % EX PTCH: 1.0000 | MEDICATED_PATCH | Freq: Every day | CUTANEOUS | 1 refills | Status: DC

## 2019-03-14 NOTE — Assessment & Plan Note (Addendum)
GFR stable at 46 today.  On chart review appears that patient has not been seen by nephrology, may need to consider if patient's kidney function continues to decrease.  Patient instructed to avoid NSAIDs or other nephrotoxic agents.

## 2019-03-14 NOTE — Telephone Encounter (Signed)
Lido-Capsaicin-Menthyl patch sent to [facility]. [facility] cannot send this per patient.  Danley Danker, RN [facility] RN)

## 2019-03-14 NOTE — Assessment & Plan Note (Signed)
Patient has appointment with oncology on 03/29/2019.  -CBC with hemoglobin of 11.0 and WBC of 68.8-called to inform patient of these results and encouraged her to ensure that she follows up with oncology.

## 2019-03-14 NOTE — Assessment & Plan Note (Signed)
BP 142/72 today.  Patient reports compliance with her metoprolol, hydrochlorothiazide and enalapril.   -CMP grossly normal with stable CKD stage IIIa

## 2019-03-14 NOTE — Assessment & Plan Note (Addendum)
Patient has previously been stable on the tramadol 50mg  nightly and Cymbalta 30mg .  States that she has not been using the Voltaren gel as she had not gotten it from her pharmacy yet.  She also states that the Flexeril helped however over the past few months her back pain has been worsening.  Patient has not had an x-ray of her back in many years, will have follow-up DG lumbar spine in order to evaluate for worsening back pain. -Patient also instructed to use salon pause and given lidocaine patches that she may try over the area of her back in order to see if this helps.

## 2019-03-14 NOTE — Assessment & Plan Note (Signed)
Patient remains controlled on Zocor 20 mg.  Patient is to call and schedule f/u appointment with vascular surgeon within the next few months for her PVD.

## 2019-03-14 NOTE — Assessment & Plan Note (Addendum)
Patient's A1c increased to 6.6 after decreasing metformin to 500 once daily. Called patients and discussed options and she would like to go back on her metformin 500 mg twice daily.  Patient's GFR has been stable at 1. -Place referral for patient to be seen by ophthalmology as she has not had a diabetic eye exam in the past few years.

## 2019-03-14 NOTE — Assessment & Plan Note (Addendum)
Patient given information or to call and schedule follow-up appointment for 1 year as instructed and given she is having worsening hip pain.  Bilateral pedal pulses not palpable on exam.  No signs of ischemia and patient only mildly symptomatic at this time.

## 2019-03-15 ENCOUNTER — Telehealth: Payer: Self-pay

## 2019-03-15 NOTE — Telephone Encounter (Signed)
Patient made aware that this may not be able to be picked up by her pharmacy and she is to get over-the-counter salon pause if this was the case.  We discussed this during our visit.

## 2019-03-15 NOTE — Telephone Encounter (Signed)
Rx for Lido-Capsaicin-Methyl patch was resent to [facility] from [place].  [facility] does not have. Is there an alternative?  Danley Danker, RN [facility] RN)

## 2019-03-16 NOTE — Telephone Encounter (Signed)
Patient aware. Nehemie Casserly, RN ([facility] RN)  

## 2019-03-18 ENCOUNTER — Other Ambulatory Visit: Payer: Self-pay

## 2019-03-18 ENCOUNTER — Encounter (HOSPITAL_COMMUNITY): Payer: Self-pay

## 2019-03-18 ENCOUNTER — Emergency Department (HOSPITAL_COMMUNITY)
Admission: EM | Admit: 2019-03-18 | Discharge: 2019-03-18 | Disposition: A | Discharge status: Home / Self Care | Payer: Medicare Other | Attending: Emergency Medicine | Admitting: Emergency Medicine

## 2019-03-18 ENCOUNTER — Emergency Department (HOSPITAL_COMMUNITY): Payer: Medicare Other

## 2019-03-18 DIAGNOSIS — R0602 Shortness of breath: Secondary | ICD-10-CM | POA: Diagnosis not present

## 2019-03-18 DIAGNOSIS — F17210 Nicotine dependence, cigarettes, uncomplicated: Secondary | ICD-10-CM | POA: Insufficient documentation

## 2019-03-18 DIAGNOSIS — I129 Hypertensive chronic kidney disease with stage 1 through stage 4 chronic kidney disease, or unspecified chronic kidney disease: Secondary | ICD-10-CM | POA: Diagnosis not present

## 2019-03-18 DIAGNOSIS — R059 Cough, unspecified: Secondary | ICD-10-CM

## 2019-03-18 DIAGNOSIS — Z79899 Other long term (current) drug therapy: Secondary | ICD-10-CM | POA: Diagnosis not present

## 2019-03-18 DIAGNOSIS — J449 Chronic obstructive pulmonary disease, unspecified: Secondary | ICD-10-CM | POA: Insufficient documentation

## 2019-03-18 DIAGNOSIS — J45909 Unspecified asthma, uncomplicated: Secondary | ICD-10-CM | POA: Insufficient documentation

## 2019-03-18 DIAGNOSIS — E1122 Type 2 diabetes mellitus with diabetic chronic kidney disease: Secondary | ICD-10-CM | POA: Insufficient documentation

## 2019-03-18 DIAGNOSIS — Z7984 Long term (current) use of oral hypoglycemic drugs: Secondary | ICD-10-CM | POA: Insufficient documentation

## 2019-03-18 DIAGNOSIS — N183 Chronic kidney disease, stage 3 (moderate): Secondary | ICD-10-CM | POA: Diagnosis not present

## 2019-03-18 DIAGNOSIS — R05 Cough: Secondary | ICD-10-CM

## 2019-03-18 DIAGNOSIS — J40 Bronchitis, not specified as acute or chronic: Secondary | ICD-10-CM | POA: Diagnosis not present

## 2019-03-18 DIAGNOSIS — Z7902 Long term (current) use of antithrombotics/antiplatelets: Secondary | ICD-10-CM | POA: Insufficient documentation

## 2019-03-18 LAB — COMPREHENSIVE METABOLIC PANEL
ALT: 9 U/L (ref 0–44)
AST: 18 U/L (ref 15–41)
Albumin: 3.8 g/dL (ref 3.5–5.0)
Alkaline Phosphatase: 99 U/L (ref 38–126)
Anion gap: 9 (ref 5–15)
BUN: 10 mg/dL (ref 8–23)
CO2: 27 mmol/L (ref 22–32)
Calcium: 9 mg/dL (ref 8.9–10.3)
Chloride: 106 mmol/L (ref 98–111)
Creatinine, Ser: 1.31 mg/dL — ABNORMAL HIGH (ref 0.44–1.00)
GFR calc Af Amer: 47 mL/min — ABNORMAL LOW (ref >60)
GFR calc non Af Amer: 41 mL/min — ABNORMAL LOW (ref >60)
Glucose, Bld: 112 mg/dL — ABNORMAL HIGH (ref 70–99)
Potassium: 4 mmol/L (ref 3.5–5.1)
Sodium: 142 mmol/L (ref 135–145)
Total Bilirubin: 0.4 mg/dL (ref 0.3–1.2)
Total Protein: 6.5 g/dL (ref 6.5–8.1)

## 2019-03-18 LAB — CBC WITH DIFFERENTIAL/PLATELET
Abs Immature Granulocytes: 0.15 10*3/uL — ABNORMAL HIGH (ref 0.00–0.07)
Basophils Absolute: 0.2 10*3/uL — ABNORMAL HIGH (ref 0.0–0.1)
Basophils Relative: 0 %
Eosinophils Absolute: 0.1 10*3/uL (ref 0.0–0.5)
Eosinophils Relative: 0 %
HCT: 37.9 % (ref 36.0–46.0)
Hemoglobin: 11.2 g/dL — ABNORMAL LOW (ref 12.0–15.0)
Immature Granulocytes: 0 %
Lymphocytes Relative: 76 %
Lymphs Abs: 47.8 10*3/uL — ABNORMAL HIGH (ref 0.7–4.0)
MCH: 26.9 pg (ref 26.0–34.0)
MCHC: 29.6 g/dL — ABNORMAL LOW (ref 30.0–36.0)
MCV: 90.9 fL (ref 80.0–100.0)
Monocytes Absolute: 9.1 10*3/uL — ABNORMAL HIGH (ref 0.1–1.0)
Monocytes Relative: 15 %
Neutro Abs: 5.5 10*3/uL (ref 1.7–7.7)
Neutrophils Relative %: 9 %
Platelets: 217 10*3/uL (ref 150–400)
RBC: 4.17 MIL/uL (ref 3.87–5.11)
RDW: 17.1 % — ABNORMAL HIGH (ref 11.5–15.5)
WBC Morphology: ABNORMAL
WBC: 62.8 10*3/uL (ref 4.0–10.5)
nRBC: 0 % (ref 0.0–0.2)

## 2019-03-18 LAB — INFLUENZA PANEL BY PCR (TYPE A & B)
Influenza A By PCR: NEGATIVE
Influenza B By PCR: NEGATIVE

## 2019-03-18 MED ORDER — AZITHROMYCIN 250 MG PO TABS: 250.0000 mg | ORAL_TABLET | Freq: Every day | ORAL | 0 refills | Status: DC

## 2019-03-18 MED ORDER — ALBUTEROL SULFATE HFA 108 (90 BASE) MCG/ACT IN AERS
2.0000 | INHALATION_SPRAY | Freq: Once | RESPIRATORY_TRACT | Status: AC
  Administered 2019-03-18: 2 via RESPIRATORY_TRACT
  Filled 2019-03-18: qty 6.7

## 2019-03-18 MED ORDER — BENZONATATE 100 MG PO CAPS
100.0000 mg | ORAL_CAPSULE | Freq: Once | ORAL | Status: AC
  Administered 2019-03-18: 100 mg via ORAL
  Filled 2019-03-18: qty 1

## 2019-03-18 MED ORDER — PREDNISONE 20 MG PO TABS: ORAL_TABLET | ORAL | 0 refills | Status: DC

## 2019-03-18 MED ORDER — BENZONATATE 100 MG PO CAPS: 100.0000 mg | ORAL_CAPSULE | Freq: Three times a day (TID) | ORAL | 0 refills | Status: DC | PRN

## 2019-03-18 MED ORDER — PREDNISONE 20 MG PO TABS
60.0000 mg | ORAL_TABLET | Freq: Once | ORAL | Status: AC
  Administered 2019-03-18: 60 mg via ORAL
  Filled 2019-03-18: qty 3

## 2019-03-18 MED ORDER — IBUPROFEN 800 MG PO TABS
800.0000 mg | ORAL_TABLET | Freq: Once | ORAL | Status: AC
  Administered 2019-03-18: 800 mg via ORAL
  Filled 2019-03-18: qty 1

## 2019-03-18 NOTE — ED Triage Notes (Addendum)
Patient coming from home with complaints of a productive cough for the past three days. Patient has been coughing up thin clear sputum. Patient also endorses SOB, chills, and diarrhea. Complaining of back pain, but is being treated for chronic back pain by PCP. Family member had the flu two weeks ago.

## 2019-03-18 NOTE — ED Notes (Signed)
Date and time results received: 03/18/19  (use smartphrase ".now" to insert current time)  Test: WBC Critical Value: 62.8  Name of Provider Notified: Merrily Pew  Orders Received? Or Actions Taken?:monitor

## 2019-03-18 NOTE — ED Provider Notes (Signed)
[facility] DEPT Provider Note   CSN: [id number] Arrival date & time: 03/18/19  0259    History   Chief Complaint Chief Complaint  Patient presents with  . Flu Like Symptoms    HPI [name] [name] is a 73 y.o. female.      Cough  Cough characteristics:  Non-productive and productive Sputum characteristics:  Yellow Severity:  Moderate Onset quality:  Gradual Duration:  2 days Timing:  Constant Progression:  Worsening Chronicity:  New Smoker: yes   Relieved by:  None tried Worsened by:  Nothing Ineffective treatments:  None tried Risk factors: no recent infection and no recent travel     Past Medical History:  Diagnosis Date  . Allergy    environmental  . Asthma   . CLL (chronic lymphoblastic leukemia)   . Constipation 11/15/2011  . COPD (chronic obstructive pulmonary disease) ([place])   . Diabetes mellitus without complication ([place])   . GERD (gastroesophageal reflux disease)   . Hyperlipidemia   . Hypertension   . PVD (peripheral vascular disease) ([place])   . Stroke [facility]) 1998   in 1998 due to tumor-right side    Patient Active Problem List   Diagnosis Date Noted  . Pulmonary nodules 01/04/2018  . Mass of left lower leg 01/04/2018  . Chronic kidney disease (CKD), stage III (moderate) ([place]) 06/04/2016  . Chronic back pain 10/02/2015  . Chronic right hip pain 03/13/2012  . Episodic tension type headache 09/24/2010  . GAIT IMBALANCE 12/10/2009  . DM (diabetes mellitus), type 2 with peripheral vascular complications ([place]) 08/65/7846  . Chronic lymphocytic leukemia ([place]) 04/11/2009  . PERIPHERAL VASCULAR DISEASE 10/13/2007  . HYPERCHOLESTEROLEMIA 02/23/2007  . ANXIETY 02/23/2007  . TOBACCO DEPENDENCE 02/23/2007  . HYPERTENSION, BENIGN SYSTEMIC 02/23/2007  . COPD (chronic obstructive pulmonary disease) ([place]) 02/23/2007  . GASTROESOPHAGEAL REFLUX, NO ESOPHAGITIS 02/23/2007    Past Surgical History:  Procedure Laterality Date   . ABI  02/2012   ABI <0.65 BL 02/2012  . BRAIN MENINGIOMA EXCISION    . Bypass grafting of RLE for PAD claudication     . CESAREAN SECTION     1974, 78 ,79  . CHOLECYSTECTOMY, LAPAROSCOPIC    . COLONOSCOPY  2014   [place]  . ESOPHAGEAL DILATION       OB History   No obstetric history on file.      Home Medications    Prior to Admission medications   Medication Sig Start Date End Date Taking? Authorizing Provider  albuterol (ACCUNEB) 0.63 MG/3ML nebulizer solution Take 3 mLs (0.63 mg total) by nebulization every 6 (six) hours as needed for wheezing. 09/29/18   Shirley, [place], DO  azithromycin (ZITHROMAX) 250 MG tablet Take 1 tablet (250 mg total) by mouth daily. Take first 2 tablets together, then 1 every day until finished. 03/18/19   Klara Stjames, Corene Cornea, MD  benzonatate (TESSALON) 100 MG capsule Take 1 capsule (100 mg total) by mouth 3 (three) times daily as needed for cough. 03/18/19   Boruch Manuele, Corene Cornea, MD  Blood Glucose Monitoring Suppl (ONETOUCH VERIO) w/Device KIT 1 Device by Does not apply route daily. 04/26/18   Smiley Houseman, MD  clopidogrel (PLAVIX) 75 MG tablet TAKE ONE TABLET BY MOUTH EVERY DAY 01/15/19   Shirley, [place], DO  cyclobenzaprine (FLEXERIL) 10 MG tablet TAKE ONE TABLET BY MOUTH AT BEDTIME 01/05/19   Shirley, [place], DO  diclofenac sodium (VOLTAREN) 1 % GEL Apply 2 g topically 4 (four) times daily. 12/28/18  Caroline More, DO  DULoxetine (CYMBALTA) 30 MG capsule TAKE ONE CAPSULE BY MOUTH EVERY DAY 12/28/18   Caroline More, DO  enalapril (VASOTEC) 20 MG tablet TAKE ONE TABLET BY MOUTH EVERY DAY 12/28/18   Caroline More, DO  famotidine (PEPCID) 20 MG tablet TAKE ONE TABLET BY MOUTH EVERY DAY 08/24/18   Shirley, [place], DO  Fluticasone-Salmeterol (ADVAIR DISKUS) 250-50 MCG/DOSE AEPB Inhale 1 puff into the lungs 2 (two) times daily. 09/29/18   Shirley, [place], DO  glucose blood [facility] VERIO) test strip Use to check blood sugar once a day 04/26/18   Smiley Houseman, MD  hydrochlorothiazide (MICROZIDE) 12.5 MG capsule TAKE ONE CAPSULE BY MOUTH EVERY DAY 01/29/19   Shirley, [place], DO  Lancets [facility] ULTRASOFT) lancets Use to check blood sugar once a day 04/26/18   Smiley Houseman, MD  Lido-Capsaicin-Men-Methyl Sal 0.5-0.035-5-20 % PTCH Apply 1 patch topically daily. As needed 03/14/19   Lind Covert, MD  metFORMIN (GLUCOPHAGE) 500 MG tablet Take 1 tablet (500 mg total) by mouth daily with breakfast. Patient taking differently: Take 500 mg by mouth 2 (two) times daily with a meal.  01/10/19   Enid Derry, [place], DO  metoprolol tartrate (LOPRESSOR) 25 MG tablet TAKE ONE HALF TABLET BY MOUTH TWICE DAILY 08/24/18   Shirley, [place], DO  Misc. Devices (ROLLER [place]) MISC 1 rolling walker with seat 06/07/18   Smiley Houseman, MD  montelukast (SINGULAIR) 10 MG tablet TAKE ONE TABLET BY MOUTH AT BEDTIME 08/24/18   Shirley, [place], DO  nicotine polacrilex (NICORETTE) 4 MG gum Take 1 each (4 mg total) by mouth as needed for smoking cessation. 09/29/18   Shirley, [place], DO  nitroGLYCERIN (NITROSTAT) 0.4 MG SL tablet DISSOLVE ONE TABLET UNDER THE TONGUE EVERY 5 MINUTES, UP TO 3 DOSES FOR CHEST PAIN 10/26/18   Shirley, [place], DO  predniSONE (DELTASONE) 20 MG tablet 2 tabs po daily x 4 days 03/18/19   Coe Angelos, Corene Cornea, MD  PROAIR HFA 108 [phone] Base) MCG/ACT inhaler INHALE 2 PUFFS BY MOUTH EVERY 6 HOURS AS NEEDED FOR WHEEZING OR SHORTNESS OF BREATH 11/27/18   Enid Derry, [place], DO  simvastatin (ZOCOR) 20 MG tablet TAKE ONE TABLET BY MOUTH AT BEDTIME 08/24/18   Enid Derry, [place], DO  traMADol (ULTRAM) 50 MG tablet TAKE ONE TABLET BY MOUTH EVERY DAY AS NEEDED FOR MODERATE PAIN 11/30/18   Shirley, [place], DO  umeclidinium bromide (INCRUSE ELLIPTA) 62.5 MCG/INH AEPB Inhale 1 puff into the lungs daily. 11/02/18   Zenia Resides, MD    Family History Family History  Problem Relation Age of Onset  . Heart disease Mother   . Asthma Mother   . Cancer Mother        uterine    . Depression Mother   . Heart attack Mother 27  . Hyperlipidemia Mother   . Hypertension Mother   . Stroke Mother   . Kidney disease Mother   . Heart attack Sister   . Stroke Sister   . Depression Sister   . Diabetes Sister   . Hyperlipidemia Sister   . Depression Sister   . Diabetes Sister   . Hyperlipidemia Sister   . HIV/AIDS Sister   . Diabetes Brother   . Asthma Brother   . Hypertension Brother   . Cancer Maternal Aunt        lung  . Heart disease Maternal Grandmother   . Heart attack Maternal Grandmother   . Cancer Brother  colon  . HIV/AIDS Brother   . Colon cancer Neg Hx     Social History Social History   Tobacco Use  . Smoking status: Current Some Day Smoker    Packs/day: 0.25    Years: 30.00    Pack years: 7.50    Types: Cigarettes    Start date: 01/01/1976  . Smokeless tobacco: Never Used  Substance Use Topics  . Alcohol use: No    Alcohol/week: 0.0 standard drinks  . Drug use: No     Allergies   Aspirin and Baclofen   Review of Systems Review of Systems  Respiratory: Positive for cough.   All other systems reviewed and are negative.    Physical Exam Updated Vital Signs BP (!) 170/85   Pulse 66   Temp 98.2 F (36.8 C) (Oral)   Resp 19   Ht '5\' 2"'  (1.575 m)   Wt 57.2 kg   SpO2 100%   BMI 23.05 kg/m   Physical Exam Vitals signs and nursing note reviewed.  Constitutional:      Appearance: She is well-developed.  HENT:     Head: Normocephalic and atraumatic.     Mouth/Throat:     Mouth: Mucous membranes are dry.     Pharynx: Oropharynx is clear.  Eyes:     Extraocular Movements: Extraocular movements intact.     Conjunctiva/sclera: Conjunctivae normal.  Neck:     Musculoskeletal: Normal range of motion.  Cardiovascular:     Rate and Rhythm: Normal rate and regular rhythm.  Pulmonary:     Effort: Pulmonary effort is normal. No respiratory distress.     Breath sounds: Normal breath sounds. No stridor.  Abdominal:      General: Abdomen is flat. There is no distension.  Musculoskeletal: Normal range of motion.        General: No swelling or tenderness.  Skin:    General: Skin is warm and dry.  Neurological:     General: No focal deficit present.     Mental Status: She is alert. She is disoriented.      ED Treatments / Results  Labs (all labs ordered are listed, but only abnormal results are displayed) Labs Reviewed  CBC WITH DIFFERENTIAL/PLATELET - Abnormal; Notable for the following components:      Result Value   WBC 62.8 (*)    Hemoglobin 11.2 (*)    MCHC 29.6 (*)    RDW 17.1 (*)    Lymphs Abs 47.8 (*)    Monocytes Absolute 9.1 (*)    Basophils Absolute 0.2 (*)    Abs Immature Granulocytes 0.15 (*)    All other components within normal limits  COMPREHENSIVE METABOLIC PANEL - Abnormal; Notable for the following components:   Glucose, Bld 112 (*)    Creatinine, Ser 1.31 (*)    GFR calc non Af Amer 41 (*)    GFR calc Af Amer 47 (*)    All other components within normal limits  INFLUENZA PANEL BY PCR (TYPE A & B)    EKG None  Radiology Dg Chest Port 1 View  Result Date: 03/18/2019 CLINICAL DATA:  73 year old female with history of productive cough for the past 3 days. Shortness of breath and chills. EXAM: PORTABLE CHEST 1 VIEW COMPARISON:  Chest x-ray 11/27/2017. FINDINGS: Lung volumes are normal. No consolidative airspace disease. No pleural effusions. No pneumothorax. No pulmonary nodule or mass noted. Pulmonary vasculature and the cardiomediastinal silhouette are within normal limits. Atherosclerotic calcifications in the thoracic aorta.  IMPRESSION: 1.  No radiographic evidence of acute cardiopulmonary disease. 2. Aortic atherosclerosis. Electronically Signed   By: Vinnie Langton M.D.   On: 03/18/2019 04:11    Procedures Procedures (including critical care time)  Medications Ordered in ED Medications  albuterol (PROVENTIL HFA;VENTOLIN HFA) 108 (90 Base) MCG/ACT inhaler 2 puff (2  puffs Inhalation Given 03/18/19 0405)  benzonatate (TESSALON) capsule 100 mg (100 mg Oral Given 03/18/19 0404)  predniSONE (DELTASONE) tablet 60 mg (60 mg Oral Given 03/18/19 0404)  ibuprofen (ADVIL,MOTRIN) tablet 800 mg (800 mg Oral Given 03/18/19 0404)     Initial Impression / Assessment and Plan / ED Course  I have reviewed the triage vital signs and the nursing notes.  Pertinent labs & imaging results that were available during my care of the patient were reviewed by me and considered in my medical decision making (see chart for details).       Likely bornchitis without respiratroy distress. Flu negative. Doubt novel coronavirus without fever or sick contacts with same.  Dc on abx/prednisone/tessalon/albuterol, pcp follow up and onc follow up for leukocytosis.   Final Clinical Impressions(s) / ED Diagnoses   Final diagnoses:  Bronchitis    ED Discharge Orders         Ordered    azithromycin (ZITHROMAX) 250 MG tablet  Daily     03/18/19 0435    predniSONE (DELTASONE) 20 MG tablet     03/18/19 0435    benzonatate (TESSALON) 100 MG capsule  3 times daily PRN     03/18/19 0435           Acel Natzke, Corene Cornea, MD 03/18/19 [id number]

## 2019-03-21 ENCOUNTER — Other Ambulatory Visit: Payer: Self-pay

## 2019-03-21 ENCOUNTER — Other Ambulatory Visit: Payer: Self-pay | Admitting: *Deleted

## 2019-03-21 NOTE — Patient Outreach (Signed)
[facility] [facility]) Care Management  03/21/2019  [name] [name] [birth year] [id number]   Telephone Screen  Referral Date: 03/21/19 Referral Source: [facility] Medicare UM referral High Referral-[facility]  Referral Reason: Member  Was seem om ER on 03/18/19 for bronchitis. She was sent home with 3 medications, Azithromycin, Benzonate and Prednisone. Member di not pick up medication due to not being able to pay for them. Member reports that this is a ongoing problem even with her monthly medications. Please follow up with any resources.  Insurance: united health care medicare  ED visit on 03/18/19 for flu like s/s -dx with bronchitis   Outreach attempt # 1A at [phone] No answer.  Outreach attempt # 1B successful at [phone]  Patient is able to verify HIPAA Reviewed and addressed referral to [facility] with patient   [name] [name] reports assistance from her Doristine Bosworth with obtaining her Azithromycin, Benzonate and Prednisone on 03/20/19 evening. She reports having this concern resolved   She  confirms she is having difficulty paying for medications monthly, housing concerns, medicaid application concerns, concerns with her cancer concerns related leukocytosis (WBC 62.8)  and back pain She reports needing assistance with understanding and filling out the medicaid forms she has    Social: [name] [name] is a 73 year old female who lives in  [place] in [place] with her husband.  ZShe states they are trying to get out of [place] and into a home. She reports they pay $650/month and would be able to afford about the same in rent somewhere else. She states her husband is sick and goes to as many medical appointments as she. She reports she has not worked since 1998. She has a sister that lives locally who assists on occasions. Her pastor is very supportive. She reports having 2 daughters, a deceased son and grandchildren that live in [place]. She reports she was born in [place] but spent  most of her life in [place].     Conditions: 03/18/19 Bronchitis, peripheral vascular disease, DM type 2 with peripheral vascular- A1c 6.6 on 03/14/19, HTN, COPD, GERD, episodic tension type headache, CKD, chronic lymphocytic leukemia, anxiety, tobacco dependence, gait imbalance, chronic back pain, mass of left lower leg, pulmonary nodules, hypercholesterolemia, Stroke in 1998, PVD,    Medications: She reports she gets her medications delivered to her sister's home since the sister uses the same company. She states Dr Enid Derry hs sent the prescriptions for all her medicines for March 2020 . She voices cost concerns with ALL of her medications   Appointments: 03/29/19 oncology appointments to f/u leukocytosis She voiced concern that she does not want Chemo     Advance Directives: Denies need for assist with advance directives    Consent: [facility] RN CM reviewed [facility] services with patient. Patient gave verbal consent for services for telephonic [facility] RN CM, [facility] pharmacy and [facility] SW.  Plan: [facility] RN CM will refer her to [facility] SW for assistance with understanding and completion of medicaid forms [place] housing resources, financial resources  [facility] RN CM will refer her to [place] for medication assistance with all her medications.  Cm provid her with Cm contact number and the 24 hours nurse call center's number  [facility] RN CM sent a successful outreach letter as discussed with [facility] brochure enclosed for review    [name] Babe L. Lavina Hamman, RN, BSN, [place] Coordinator Office number [phone] Mobile number [phone]  Main [facility]  number [phone] Fax number [phone]

## 2019-03-26 ENCOUNTER — Other Ambulatory Visit: Payer: Self-pay | Admitting: Pharmacist

## 2019-03-26 ENCOUNTER — Other Ambulatory Visit: Payer: Self-pay

## 2019-03-26 NOTE — Patient Outreach (Signed)
[facility]) [place]   03/26/2019  [name] [name] 1946/07/13 [id number]  Reason for referral: Medication Assistance, Medication Review  Referral source: [facility] RN Current insurance: [facility]  PMHx includes but not limited to:  T2DM, PVD, HTN, HLD, COPD, GERD, headaches, CKD III, CLL, anxiety, current smoker, hx stroke, chronic back pain   Outreach:  Unsuccessful telephone call attempt #1 to patient. Patient answered phone but states she is not feeling well.  She requests that I call her back later this week.    Plan:  I will make another outreach attempt to patient within 3-4 business days   Ralene Bathe, PharmD, [place] [phone]

## 2019-03-26 NOTE — Patient Outreach (Signed)
[facility]) Care Management  03/26/2019  [name] [name] December 21, 1946 [id number]   Initial outreach scheduled for today regarding social work referral.   [facility] Pharmacist completed outreach to patient today and noted that "Patient answered phone but states she is not feeling well.  She requests that I call her back later this week."  Due to this, BSW will plan to outreach patient later in the week.  Ronn Melena, BSW Social Worker [phone]

## 2019-03-28 ENCOUNTER — Other Ambulatory Visit: Payer: Self-pay | Admitting: Pharmacist

## 2019-03-28 ENCOUNTER — Ambulatory Visit: Payer: Self-pay | Admitting: Pharmacist

## 2019-03-28 ENCOUNTER — Other Ambulatory Visit: Payer: Self-pay

## 2019-03-28 NOTE — Patient Outreach (Signed)
[facility]) Care Management  03/28/2019  [name] [name] [birth year] [id number]  Successful outreach to patient regarding social work referral for assistance with Medicaid application as well as housing and financial resources.  Per patient, she previously applied for Medicaid but her application was never processed because she did not submit the additional required documentation in a timely manner.  BSW agreed to send her another application. Patient reports that she and her husband are currently residing in a motel.  She would like to relocate because she feels they are paying too much for the amount of space they have.  Patient found a house that she would like to rent and said that she can afford the monthly payment but cannot afford to pay the first month's rent and deposit at the same time.  BSW asked about family or church members that may be able to assist.  Per patient, she is unsure if church is able to help at this time. She has a grandson who is supportive and said that she knows he would help if he was able but he just recently had a baby. BSW agreed to send housing and financial resources.  BSW will follow up within the next two weeks to ensure receipt of these resources.   Ronn Melena, BSW Social Worker [phone]

## 2019-03-28 NOTE — Patient Outreach (Signed)
[facility]) [place]  03/28/2019  [name] [name] Apr 29, 1946 [id number]  Reason for referral: Medication Assistance, Medication Review  Referral source: [facility] RN Current insurance: [facility]  PMHx includes but not limited to:  T2DM, PVD, HTN, HLD, COPD, GERD, headaches, CKD III, CLL, anxiety, current smoker, hx stroke, chronic back pain  Outreach:  Unsuccessful telephone call attempt #2 to patient.   HIPAA compliant voicemail left requesting a return call  Plan:  I will make another outreach attempt to patient within 3-4 business days   Ralene Bathe, PharmD, [place] [phone]

## 2019-03-28 NOTE — Progress Notes (Signed)
HEMATOLOGY/ONCOLOGY CLINIC NOTE  Date of Service: 03/29/19    Patient Care Team: Shirley, [place], DO as PCP - General (Family Medicine) Rudean Haskell, [facility] as [place] Management (Pharmacist) Chrismon, Museum/gallery conservator as [facility] Orthoptist  CHIEF COMPLAINTS/PURPOSE OF CONSULTATION:  F/u for CLL  HISTORY OF PRESENTING ILLNESS:   [name] [name] is a wonderful 73 y.o. female smoker with history of CLL who has been referred to [place] by Dr Enid Derry, [place], DO at [facility] for evaluation and management of elevated WBC/lymphocytes.   She is accompanied by her husband. She reported to ED on 11/27/2017 with cough and abdominal pain with COPD exacerbation. Her labs on this day showed WBC elevated at 62.5. She has not previously seen a hematologist. She reports a plan to quit smoking on the first day of the new year. She reports worsening decrease in balance and states she has a cane and walker that she uses as needed. She was taking Prednisone.  No weight loss/fevers/chills/night sweats/ palpable lumps or bumps.  On review of systems, pt reports back pain, change in balance, abdominal pain, weight loss and denies changes in BM, fever and any other accompanying symptoms.  INTERVAL HISTORY:  [name] [name] is here for management and evaluation of f her Chronic Leukocytic Leukemia. The patient's last visit with [place] was on 11/30/18. The pt reports that she is doing well overall.  The pt reports that she has not had any fevers, chills, night sweats, new fatigue, unexpected weight loss, or noticing any new lumps or bumps.   The pt notes that she had a recent bronchitis and presented to the ED on 03/18/19. She had an antibiotic course and notes resolution of her cough.  Lab results today (03/29/19) of CBC w/diff and CMP is as follows: all values are WNL except for WBC at 73k, HGB at 11.0, RDW at 17.9, Lymphs at 67.2k, Monocytes abs at 0.0k, Glucose at 155,  Creatinine at 1.33, Total Protein at 6.4, GFR at 46. 03/29/19 LDH at 209  On review of systems, pt reports eating well, sleeping well, chronic back pain, and denies fevers, chills, night sweats, unexpected weight loss, noticing any new lumps or bumps, new fatigue, cough, new pains, abdominal pains, leg swelling, and any other symptoms.  MEDICAL HISTORY:  Past Medical History:  Diagnosis Date  . Allergy    environmental  . Asthma   . CLL (chronic lymphoblastic leukemia)   . Constipation 11/15/2011  . COPD (chronic obstructive pulmonary disease) ([place])   . Diabetes mellitus without complication ([place])   . GERD (gastroesophageal reflux disease)   . Hyperlipidemia   . Hypertension   . PVD (peripheral vascular disease) ([place])   . Stroke [facility]) 1998   in 1998 due to tumor-right side    SURGICAL HISTORY: Past Surgical History:  Procedure Laterality Date  . ABI  02/2012   ABI <0.65 BL 02/2012  . BRAIN MENINGIOMA EXCISION    . Bypass grafting of RLE for PAD claudication     . CESAREAN SECTION     1974, 78 ,80  . CHOLECYSTECTOMY, LAPAROSCOPIC    . COLONOSCOPY  2014   [place], [place]  . ESOPHAGEAL DILATION      SOCIAL HISTORY: Social History   Socioeconomic History  . Marital status: Married    Spouse name: Not on file  . Number of children: 3  . Years of education: Not on file  . Highest education level: Not on file  Occupational History  . Occupation: DISABLED    Employer: DISABLED  Social Needs  . Financial resource strain: Not on file  . Food insecurity:    Worry: Not on file    Inability: Not on file  . Transportation needs:    Medical: Not on file    Non-medical: Not on file  Tobacco Use  . Smoking status: Current Some Day Smoker    Packs/day: 0.25    Years: 30.00    Pack years: 7.50    Types: Cigarettes    Start date: 01/01/1976  . Smokeless tobacco: Never Used  Substance and Sexual Activity  . Alcohol use: No    Alcohol/week: 0.0 standard drinks  . Drug use: No   . Sexual activity: Yes  Lifestyle  . Physical activity:    Days per week: Not on file    Minutes per session: Not on file  . Stress: Not on file  Relationships  . Social connections:    Talks on phone: Not on file    Gets together: Not on file    Attends religious service: Not on file    Active member of club or organization: Not on file    Attends meetings of clubs or organizations: Not on file    Relationship status: Not on file  . Intimate partner violence:    Fear of current or ex partner: Not on file    Emotionally abused: Not on file    Physically abused: Not on file    Forced sexual activity: Not on file  Other Topics Concern  . Not on file  Social History Narrative   Health Care POA:    Emergency Contact: Angelee Bahr [phone] (c)   End of Life Plan:    Who lives with you: Lives with husband   Any pets: none   Diet: Patient lacks financial resources for much food. Pt reports eating what is available.   Exercise: Patient does not have an exercise plan.   Seatbelts: Patient reports wearing seatbelt when in vehicle.    [name] Exposure/Protection:   Hobbies: Bowling, computer games, Bingo      Has financial difficulties and transportation issues as she and her husband share transportation        FAMILY HISTORY: Family History  Problem Relation Age of Onset  . Heart disease Mother   . Asthma Mother   . Cancer Mother        uterine   . Depression Mother   . Heart attack Mother 108  . Hyperlipidemia Mother   . Hypertension Mother   . Stroke Mother   . Kidney disease Mother   . Heart attack Sister   . Stroke Sister   . Depression Sister   . Diabetes Sister   . Hyperlipidemia Sister   . Depression Sister   . Diabetes Sister   . Hyperlipidemia Sister   . HIV/AIDS Sister   . Diabetes Brother   . Asthma Brother   . Hypertension Brother   . Cancer Maternal Aunt        lung  . Heart disease Maternal Grandmother   . Heart attack Maternal Grandmother   .  Cancer Brother        colon  . HIV/AIDS Brother   . Colon cancer Neg Hx     ALLERGIES:  is allergic to aspirin and baclofen.  MEDICATIONS:  Current Outpatient Medications  Medication Sig Dispense Refill  . albuterol (ACCUNEB) 0.63 MG/3ML nebulizer solution Take 3 mLs (0.63  mg total) by nebulization every 6 (six) hours as needed for wheezing. 75 mL 12  . azithromycin (ZITHROMAX) 250 MG tablet Take 1 tablet (250 mg total) by mouth daily. Take first 2 tablets together, then 1 every day until finished. 6 tablet 0  . benzonatate (TESSALON) 100 MG capsule Take 1 capsule (100 mg total) by mouth 3 (three) times daily as needed for cough. 21 capsule 0  . Blood Glucose Monitoring Suppl (ONETOUCH VERIO) w/Device KIT 1 Device by Does not apply route daily. 1 kit 0  . clopidogrel (PLAVIX) 75 MG tablet TAKE ONE TABLET BY MOUTH EVERY DAY 30 tablet 14  . cyclobenzaprine (FLEXERIL) 10 MG tablet TAKE ONE TABLET BY MOUTH AT BEDTIME 30 tablet 5  . diclofenac sodium (VOLTAREN) 1 % GEL Apply 2 g topically 4 (four) times daily. 100 g 11  . DULoxetine (CYMBALTA) 30 MG capsule TAKE ONE CAPSULE BY MOUTH EVERY DAY 30 capsule 11  . enalapril (VASOTEC) 20 MG tablet TAKE ONE TABLET BY MOUTH EVERY DAY 30 tablet 11  . famotidine (PEPCID) 20 MG tablet TAKE ONE TABLET BY MOUTH EVERY DAY 30 tablet 11  . Fluticasone-Salmeterol (ADVAIR DISKUS) 250-50 MCG/DOSE AEPB Inhale 1 puff into the lungs 2 (two) times daily. 1 each 3  . glucose blood (ONETOUCH VERIO) test strip Use to check blood sugar once a day 100 each 12  . hydrochlorothiazide (MICROZIDE) 12.5 MG capsule TAKE ONE CAPSULE BY MOUTH EVERY DAY 30 capsule 14  . Lancets (ONETOUCH ULTRASOFT) lancets Use to check blood sugar once a day 100 each 12  . Lido-Capsaicin-Men-Methyl Sal 0.5-0.035-5-20 % PTCH Apply 1 patch topically daily. As needed 30 patch 1  . metFORMIN (GLUCOPHAGE) 500 MG tablet Take 1 tablet (500 mg total) by mouth daily with breakfast. (Patient taking  differently: Take 500 mg by mouth 2 (two) times daily with a meal. ) 90 tablet 3  . metoprolol tartrate (LOPRESSOR) 25 MG tablet TAKE ONE HALF TABLET BY MOUTH TWICE DAILY 30 tablet 11  . Misc. Devices (ROLLER WALKER) MISC 1 rolling walker with seat 1 each 0  . montelukast (SINGULAIR) 10 MG tablet TAKE ONE TABLET BY MOUTH AT BEDTIME 30 tablet 11  . nicotine polacrilex (NICORETTE) 4 MG gum Take 1 each (4 mg total) by mouth as needed for smoking cessation. 100 tablet 0  . nitroGLYCERIN (NITROSTAT) 0.4 MG SL tablet DISSOLVE ONE TABLET UNDER THE TONGUE EVERY 5 MINUTES, UP TO 3 DOSES FOR CHEST PAIN 25 tablet 11  . predniSONE (DELTASONE) 20 MG tablet 2 tabs po daily x 4 days 8 tablet 0  . PROAIR HFA 108 (90 Base) MCG/ACT inhaler INHALE 2 PUFFS BY MOUTH EVERY 6 HOURS AS NEEDED FOR WHEEZING OR SHORTNESS OF BREATH 8.5 each 11  . simvastatin (ZOCOR) 20 MG tablet TAKE ONE TABLET BY MOUTH AT BEDTIME 30 tablet 11  . traMADol (ULTRAM) 50 MG tablet TAKE ONE TABLET BY MOUTH EVERY DAY AS NEEDED FOR MODERATE PAIN 30 tablet 5  . umeclidinium bromide (INCRUSE ELLIPTA) 62.5 MCG/INH AEPB Inhale 1 puff into the lungs daily. 4 each 0   No current facility-administered medications for this visit.     REVIEW OF SYSTEMS:    A 10+ POINT REVIEW OF SYSTEMS WAS OBTAINED including neurology, dermatology, psychiatry, cardiac, respiratory, lymph, extremities, GI, GU, Musculoskeletal, constitutional, breasts, reproductive, HEENT.  All pertinent positives are noted in the HPI.  All others are negative.   PHYSICAL EXAMINATION: ECOG PERFORMANCE STATUS: 1 - Symptomatic but completely ambulatory  Vitals:   03/29/19 1136  BP: (!) 142/74  Pulse: 63  Resp: 17  Temp: 98.2 F (36.8 C)  SpO2: 100%   Filed Weights   03/29/19 1136  Weight: 129 lb 3.2 oz (58.6 kg)   .Body mass index is 23.63 kg/m.  GENERAL:alert, in no acute distress and comfortable SKIN: no acute rashes, no significant lesions EYES: conjunctiva are pink and  non-injected, sclera anicteric OROPHARYNX: MMM, no exudates, no oropharyngeal erythema or ulceration NECK: supple, no JVD LYMPH: Minimally palpable cervical lymph nodes, no palpable lymphadenopathy in the axillary or inguinal regions LUNGS: clear to auscultation b/l with normal respiratory effort HEART: regular rate & rhythm ABDOMEN:  normoactive bowel sounds , non tender, not distended. No palpable hepatosplenomegaly.  Extremity: no pedal edema PSYCH: alert & oriented x 3 with fluent speech NEURO: no focal motor/sensory deficits   LABORATORY DATA:  I have reviewed the data as listed  . CBC Latest Ref Rng & Units 03/29/2019 03/18/2019 03/09/2019  WBC 4.0 - 10.5 K/uL 73.0(HH) 62.8(HH) 68.8(HH)  Hemoglobin 12.0 - 15.0 g/dL 11.0(L) 11.2(L) 11.0(L)  Hematocrit 36.0 - 46.0 % 36.6 37.9 35.5  Platelets 150 - 400 K/uL 216 217 247    . CMP Latest Ref Rng & Units 03/29/2019 03/18/2019 03/09/2019  Glucose 70 - 99 mg/dL 155(H) 112(H) 125(H)  BUN 8 - 23 mg/dL '14 10 13  ' Creatinine 0.44 - 1.00 mg/dL 1.33(H) 1.31(H) 1.34(H)  Sodium 135 - 145 mmol/L 139 142 141  Potassium 3.5 - 5.1 mmol/L 4.0 4.0 4.7  Chloride 98 - 111 mmol/L 102 106 102  CO2 22 - 32 mmol/L '28 27 28  ' Calcium 8.9 - 10.3 mg/dL 9.0 9.0 9.2  Total Protein 6.5 - 8.1 g/dL 6.4(L) 6.5 6.2  Total Bilirubin 0.3 - 1.2 mg/dL 0.3 0.4 <0.2  Alkaline Phos 38 - 126 U/L 115 99 105  AST 15 - 41 U/L '21 18 19  ' ALT 0 - 44 U/L '20 9 11       ' . Lab Results  Component Value Date   LDH 209 (H) 03/29/2019    RADIOGRAPHIC STUDIES: I have personally reviewed the radiological images as listed and agreed with the findings in the report. Dg Chest Port 1 View  Result Date: 03/18/2019 CLINICAL DATA:  73 year old female with history of productive cough for the past 3 days. Shortness of breath and chills. EXAM: PORTABLE CHEST 1 VIEW COMPARISON:  Chest x-ray 11/27/2017. FINDINGS: Lung volumes are normal. No consolidative airspace disease. No pleural  effusions. No pneumothorax. No pulmonary nodule or mass noted. Pulmonary vasculature and the cardiomediastinal silhouette are within normal limits. Atherosclerotic calcifications in the thoracic aorta. IMPRESSION: 1.  No radiographic evidence of acute cardiopulmonary disease. 2. Aortic atherosclerosis. Electronically Signed   By: Vinnie Langton M.D.   On: 03/18/2019 04:11   Surgical Pathology 12/13/2017   ASSESSMENT & PLAN:   DRISANA SCHWEICKERT is a wonderful 73 y.o. female with    1. Rai 1 Chronic Lymphocytic Leukemia  - Trisomy 12 mutation. No associated thrombocytopenia.  Mild anemia - likely from acute blood loss from significant epistaxis. No constitutional symptoms. Minimal LNaednopathy No splenomegaly  PLAN:   -Discussed pt labwork today, 03/29/19; WBC increased to 73k over the past 3 months, HGB stable at 11.0, PLT stable at 216k. LDH stable at 209. -No constitutional symptoms, labs stable, no new lymphadenopathy, will continue with watchful observation -No overt indication to begin treatment at this time -Again counseled on important symptoms to monitor for  -  Recommend the pt continue using her walker for balance due to concerns with neuropathy. -Recommend discussing strength and balance optimization with her PCP and consider a PT referral -Will see the pt back in 3 months   #2 .Lung nodules on CT in 12/2017. Rpt CT chest 08/09/2018 - stable  #3 Patient Active Problem List   Diagnosis Date Noted  . Pulmonary nodules 01/04/2018  . Mass of left lower leg 01/04/2018  . Chronic kidney disease (CKD), stage III (moderate) ([place]) 06/04/2016  . Chronic back pain 10/02/2015  . Chronic right hip pain 03/13/2012  . Episodic tension type headache 09/24/2010  . GAIT IMBALANCE 12/10/2009  . DM (diabetes mellitus), type 2 with peripheral vascular complications ([place]) 69/62/9528  . Chronic lymphocytic leukemia ([place]) 04/11/2009  . PERIPHERAL VASCULAR DISEASE 10/13/2007  .  HYPERCHOLESTEROLEMIA 02/23/2007  . ANXIETY 02/23/2007  . TOBACCO DEPENDENCE 02/23/2007  . HYPERTENSION, BENIGN SYSTEMIC 02/23/2007  . COPD (chronic obstructive pulmonary disease) ([place]) 02/23/2007  . GASTROESOPHAGEAL REFLUX, NO ESOPHAGITIS 02/23/2007   Mx per PCP   RTC with Dr Irene Limbo in 3 months with labs   All of the patients questions were answered with apparent satisfaction. The patient knows to call the clinic with any problems, questions or concerns.  The total time spent in the appt was 20 minutes and more than 50% was on counseling and direct patient cares.    Sullivan Lone MD [place] AAHIVMS [facility] Hematology/Oncology Physician [facility]  (Office):       [phone] (Work cell):  [phone] (Fax):           [phone]  I, [place], am acting as a scribe for Dr. Sullivan Lone.   .I have reviewed the above documentation for accuracy and completeness, and I agree with the above. Brunetta Genera MD

## 2019-03-29 ENCOUNTER — Inpatient Hospital Stay: Payer: Medicare Other

## 2019-03-29 ENCOUNTER — Telehealth: Payer: Self-pay | Admitting: Hematology

## 2019-03-29 ENCOUNTER — Other Ambulatory Visit: Payer: Self-pay

## 2019-03-29 ENCOUNTER — Inpatient Hospital Stay: Payer: Medicare Other | Attending: Hematology | Admitting: Hematology

## 2019-03-29 VITALS — BP 142/74 | HR 63 | Temp 98.2°F | Resp 17 | Ht 62.0 in | Wt 129.2 lb

## 2019-03-29 DIAGNOSIS — D649 Anemia, unspecified: Secondary | ICD-10-CM | POA: Diagnosis not present

## 2019-03-29 DIAGNOSIS — C9110 Chronic lymphocytic leukemia of B-cell type not having achieved remission: Secondary | ICD-10-CM

## 2019-03-29 DIAGNOSIS — Z720 Tobacco use: Secondary | ICD-10-CM | POA: Diagnosis not present

## 2019-03-29 DIAGNOSIS — R911 Solitary pulmonary nodule: Secondary | ICD-10-CM | POA: Insufficient documentation

## 2019-03-29 LAB — CBC WITH DIFFERENTIAL/PLATELET
Abs Immature Granulocytes: 0 10*3/uL (ref 0.00–0.07)
Basophils Absolute: 0 10*3/uL (ref 0.0–0.1)
Basophils Relative: 0 %
Eosinophils Absolute: 0 10*3/uL (ref 0.0–0.5)
Eosinophils Relative: 0 %
HCT: 36.6 % (ref 36.0–46.0)
Hemoglobin: 11 g/dL — ABNORMAL LOW (ref 12.0–15.0)
Lymphocytes Relative: 92 %
Lymphs Abs: 67.2 10*3/uL — ABNORMAL HIGH (ref 0.7–4.0)
MCH: 26.9 pg (ref 26.0–34.0)
MCHC: 30.1 g/dL (ref 30.0–36.0)
MCV: 89.5 fL (ref 80.0–100.0)
Monocytes Absolute: 0 10*3/uL — ABNORMAL LOW (ref 0.1–1.0)
Monocytes Relative: 0 %
Neutro Abs: 5.8 10*3/uL (ref 1.7–17.7)
Neutrophils Relative %: 8 %
Platelets: 216 10*3/uL (ref 150–400)
RBC: 4.09 MIL/uL (ref 3.87–5.11)
RDW: 17.9 % — ABNORMAL HIGH (ref 11.5–15.5)
WBC: 73 10*3/uL (ref 4.0–10.5)
nRBC: 0 % (ref 0.0–0.2)

## 2019-03-29 LAB — CMP (CANCER CENTER ONLY)
ALT: 20 U/L (ref 0–44)
AST: 21 U/L (ref 15–41)
Albumin: 3.5 g/dL (ref 3.5–5.0)
Alkaline Phosphatase: 115 U/L (ref 38–126)
Anion gap: 9 (ref 5–15)
BUN: 14 mg/dL (ref 8–23)
CO2: 28 mmol/L (ref 22–32)
Calcium: 9 mg/dL (ref 8.9–10.3)
Chloride: 102 mmol/L (ref 98–111)
Creatinine: 1.33 mg/dL — ABNORMAL HIGH (ref 0.44–1.00)
GFR, Est AFR Am: 46 mL/min — ABNORMAL LOW (ref >60)
GFR, Estimated: 40 mL/min — ABNORMAL LOW (ref >60)
Glucose, Bld: 155 mg/dL — ABNORMAL HIGH (ref 70–99)
Potassium: 4 mmol/L (ref 3.5–5.1)
Sodium: 139 mmol/L (ref 135–145)
Total Bilirubin: 0.3 mg/dL (ref 0.3–1.2)
Total Protein: 6.4 g/dL — ABNORMAL LOW (ref 6.5–8.1)

## 2019-03-29 LAB — LACTATE DEHYDROGENASE: LDH: 209 U/L — ABNORMAL HIGH (ref 98–192)

## 2019-03-29 NOTE — Telephone Encounter (Signed)
Called and scheduled appt per 4/2 los.  Patient aware of appt date and time.

## 2019-04-02 ENCOUNTER — Other Ambulatory Visit: Payer: Self-pay | Admitting: Family Medicine

## 2019-04-02 NOTE — Telephone Encounter (Signed)
I have reviewed [place] and refills are appropriate. Overdose risk score 280.

## 2019-04-05 ENCOUNTER — Ambulatory Visit: Payer: Self-pay | Admitting: Pharmacist

## 2019-04-05 ENCOUNTER — Other Ambulatory Visit: Payer: Self-pay

## 2019-04-05 ENCOUNTER — Other Ambulatory Visit: Payer: Self-pay | Admitting: Pharmacist

## 2019-04-05 NOTE — Patient Outreach (Signed)
[place] [facility]) Care Management  04/05/2019  [name] [name] 11-Jun-1946 [id number]   Successful outreach to patient to ensure receipt of housing and financial resources that were mailed last week.  Patient confirmed receipt and said that her daughter is assisting with completion of Medicaid application.  BSW is closing case but encouraged patient to call if additional questions/needs arise.   Ronn Melena, BSW Social Worker [phone]

## 2019-04-05 NOTE — Patient Outreach (Addendum)
[facility]) Care Management  [place]   04/05/2019  SENAI RAMNATH 04/05/1946 [id number]  Reason for referral:Medication Assistance, Medication Review  Referral source:[facility] RN Current insurance:[employer] [place]  PMHx includes but not limited to:T2DM, PVD, HTN, HLD, COPD, GERD, headaches, CKD III, CLL, anxiety, current smoker, hx stroke, chronic back pain  Outreach:  Successful telephone call with Ms. Marlyce Huge.  HIPAA identifiers verified.   Subjective:  Patient reports using compliance packs through [facility] as an adherence strategy.  Does the patient ever forget to take medication?  Occassionally Does the patient have problems obtaining medications due to transportation?   No- delivered from pharmacy Does the patient have problems obtaining medications due to cost?  yes Does the patient measure his/her own blood glucose at home?  Yes 2x daily   Objective: Lab Results  Component Value Date   CREATININE 1.33 (H) 03/29/2019   CREATININE 1.31 (H) 03/18/2019   CREATININE 1.34 (H) 03/09/2019    Lab Results  Component Value Date   HGBA1C 6.6 03/09/2019    Lipid Panel     Component Value Date/Time   CHOL 132 03/09/2019 1024   TRIG 151 (H) 03/09/2019 1024   HDL 38 (L) 03/09/2019 1024   CHOLHDL 3.5 03/09/2019 1024   CHOLHDL 3.1 10/29/2016 1022   VLDL 36 (H) 10/29/2016 1022   LDLCALC 64 03/09/2019 1024   LDLDIRECT 62 09/24/2010 2055    BP Readings from Last 3 Encounters:  03/29/19 (!) 142/74  03/18/19 (!) 170/85  03/09/19 (!) 142/72    Allergies  Allergen Reactions  . Aspirin Other (See Comments)    irritates stomach  . Baclofen Other (See Comments)    Stomach irritation    Medications Reviewed Today    Reviewed by Si Raider, RN (Registered Nurse) on 03/18/19 at [phone]  Med List Status: <None>  Medication Order Taking? Sig Documenting Provider Last Dose Status Informant  albuterol (ACCUNEB) 0.63 MG/3ML  nebulizer solution [id number]  Take 3 mLs (0.63 mg total) by nebulization every 6 (six) hours as needed for wheezing. Enid Derry, [place], DO  Active   Blood Glucose Monitoring Suppl (ONETOUCH VERIO) w/Device KIT [id number]  1 Device by Does not apply route daily. Smiley Houseman, MD  Active   clopidogrel (PLAVIX) 75 MG tablet [id number]  TAKE ONE TABLET BY MOUTH EVERY DAY Enid Derry [place], DO  Active   cyclobenzaprine (FLEXERIL) 10 MG tablet [id number]  TAKE ONE TABLET BY MOUTH AT BEDTIME Shirley, [place], DO  Active   diclofenac sodium (VOLTAREN) 1 % GEL [id number]  Apply 2 g topically 4 (four) times daily. Caroline More, DO  Active   DULoxetine (CYMBALTA) 30 MG capsule [id number]  TAKE ONE CAPSULE BY MOUTH EVERY DAY Caroline More, DO  Active   enalapril (VASOTEC) 20 MG tablet [id number]  TAKE ONE TABLET BY MOUTH EVERY DAY Caroline More, DO  Active   famotidine (PEPCID) 20 MG tablet [id number]  TAKE ONE TABLET BY MOUTH EVERY DAY Shirley, [place], DO  Active   Fluticasone-Salmeterol (ADVAIR DISKUS) 250-50 MCG/DOSE AEPB [id number]  Inhale 1 puff into the lungs 2 (two) times daily. Shirley, [place], DO  Active            Med Note Valentina Lucks, [place] Nov 02, 2018 11:29 AM) Missing evening dose occasionally  glucose blood (ONETOUCH VERIO) test strip [id number]  Use to check blood sugar once a day Smiley Houseman, MD  Active   hydrochlorothiazide (MICROZIDE) 12.5 MG capsule  [id number]  TAKE ONE CAPSULE BY MOUTH EVERY DAY Shirley, Jordan, DO  Active   Lancets (ONETOUCH ULTRASOFT) lancets [id number]  Use to check blood sugar once a day Gunadasa, Kanishka G, MD  Active   Lido-Capsaicin-Men-Methyl Sal 0.5-0.035-5-20 % PTCH [id number]  Apply 1 patch topically daily. As needed Chambliss, Marshall L, MD  Active   metFORMIN (GLUCOPHAGE) 500 MG tablet [id number]  Take 1 tablet (500 mg total) by mouth daily with breakfast.  Patient taking differently:  Take 500 mg by mouth 2 (two) times daily with a meal.    Shirley,  Jordan, DO  Active   metoprolol tartrate (LOPRESSOR) 25 MG tablet [id number]  TAKE ONE HALF TABLET BY MOUTH TWICE DAILY Shirley, Jordan, DO  Active   Misc. Devices (ROLLER WALKER) MISC [id number]  1 rolling walker with seat Gunadasa, Kanishka G, MD  Active   montelukast (SINGULAIR) 10 MG tablet [id number]  TAKE ONE TABLET BY MOUTH AT BEDTIME Shirley, Jordan, DO  Active   nicotine polacrilex (NICORETTE) 4 MG gum [id number]  Take 1 each (4 mg total) by mouth as needed for smoking cessation. Shirley, Jordan, DO  Active   nitroGLYCERIN (NITROSTAT) 0.4 MG SL tablet [id number]  DISSOLVE ONE TABLET UNDER THE TONGUE EVERY 5 MINUTES, UP TO 3 DOSES FOR CHEST PAIN Shirley, Jordan, DO  Active   PROAIR HFA 108 (90 Base) MCG/ACT inhaler [id number]  INHALE 2 PUFFS BY MOUTH EVERY 6 HOURS AS NEEDED FOR WHEEZING OR SHORTNESS OF BREATH Shirley, Jordan, DO  Active   simvastatin (ZOCOR) 20 MG tablet [id number]  TAKE ONE TABLET BY MOUTH AT BEDTIME Shirley, Jordan, DO  Active   traMADol (ULTRAM) 50 MG tablet [id number]  TAKE ONE TABLET BY MOUTH EVERY DAY AS NEEDED FOR MODERATE PAIN Shirley, Jordan, DO  Active   umeclidinium bromide (INCRUSE ELLIPTA) 62.5 MCG/INH AEPB [id number]  Inhale 1 puff into the lungs daily. Hensel, William A, MD  Active   Med List Note (Rowe, Carla H, CPhT 06/06/15 1009): Patient also uses [facility] - [address], [place], [place]           Assessment:  Drugs sorted by system:  Neurologic/Psychologic: duloxetine  Cardiovascular: clopidogrel, enalapril, hydrochlorothiazide, metoprolol, simvastatin, SL NTG  Pulmonary/Allergy: albuterol nebulizer + inhaler, Advair, Incruse Ellipta, singulair  Gastrointestinal:famotidine  Endocrine:metformin  Pain:cyclobenzaprine, tramadol, diclofenac gel  Medication Review Findings:  . Patient confused about clopidogrel.  She states she was taken off this medication in the past due to severe nose bleeds however pharmacy confirmed she is still on this  medication currently.   She has not had a nose bleed recently.  She is aware to call the office if any signs or symptoms of bleeding occur.  . Per [facility] pharmacy, no refills remaining on diclofenac gel and duloxetine and no updated instructions for metformin (Qday --> BID).  Will alert Dr. Shirley of need for new prescriptions for these medications . Incruse on profile but on record of this inhaler filled through [facility].  Will clarify with Dr. Shirley  . Has supply of Nicotine gum but states she cannot chew these.  We discussed that she could change to lozenges but patient declines wanting to change formulation at this time.     Medication Assistance Findings:  Medication assistance needs identified. Patient states she has trouble paying for medication costs.  Confirmed with pharmacy that she is receiving Extra Help LIS however.  No further medication cost reduction available at this time.   Plan: . Will route note   to Dr. Enid Derry regarding medication issues as noted above.  . Will close [facility] pharmacy case but am happy to assist in the future as needed.   Ralene Bathe, PharmD, [place] [phone]

## 2019-04-06 ENCOUNTER — Ambulatory Visit: Payer: Self-pay | Admitting: Pharmacist

## 2019-04-09 ENCOUNTER — Ambulatory Visit: Payer: Medicare Other

## 2019-06-19 ENCOUNTER — Other Ambulatory Visit: Payer: Self-pay

## 2019-06-20 MED ORDER — DULOXETINE HCL 30 MG PO CPEP: 30.0000 mg | ORAL_CAPSULE | Freq: Every day | ORAL | 11 refills | Status: DC

## 2019-06-20 MED ORDER — DICLOFENAC SODIUM 1 % TD GEL: 2.0000 g | Freq: Four times a day (QID) | TRANSDERMAL | 11 refills | Status: DC

## 2019-06-20 MED ORDER — FAMOTIDINE 20 MG PO TABS: 40.0000 mg | ORAL_TABLET | Freq: Every day | ORAL | 0 refills | Status: DC

## 2019-06-22 ENCOUNTER — Ambulatory Visit (INDEPENDENT_AMBULATORY_CARE_PROVIDER_SITE_OTHER): Payer: Medicare Other | Admitting: Family Medicine

## 2019-06-22 ENCOUNTER — Other Ambulatory Visit: Payer: Self-pay

## 2019-06-22 ENCOUNTER — Encounter: Payer: Self-pay | Admitting: Family Medicine

## 2019-06-22 ENCOUNTER — Ambulatory Visit (HOSPITAL_COMMUNITY)
Admission: RE | Admit: 2019-06-22 | Discharge: 2019-06-22 | Disposition: A | Discharge status: Home / Self Care | Payer: Medicare Other | Source: Ambulatory Visit | Attending: Family Medicine | Admitting: Family Medicine

## 2019-06-22 VITALS — BP 160/80 | HR 70

## 2019-06-22 DIAGNOSIS — I872 Venous insufficiency (chronic) (peripheral): Secondary | ICD-10-CM | POA: Insufficient documentation

## 2019-06-22 DIAGNOSIS — R600 Localized edema: Secondary | ICD-10-CM | POA: Diagnosis not present

## 2019-06-22 NOTE — Assessment & Plan Note (Signed)
She is not currently taking amlodipine, physical exam is not remarkable for fluid overload, breathing comfortably on room air.  No complaints of shortness of breath or chest pain, low suspicion for for PE.  Last hemoglobin 11.0.  No strong evidence of kidney or liver dysfunction.  Due to her history of blood clots and lower extremity stenting (not available in our chart).  I am concerned for DVT.  Lower extremity Dopplers have been ordered to rule out DVT before treating her lower extremity edema with compression socks.  Lower extremity Doppler was obtained and showed no evidence of DVT.  She was called and a voicemail was left informing her that there was no evidence of DVT she should follow-up by getting compression socks.  This is most likely secondary to her peripheral vascular disease.

## 2019-06-22 NOTE — Progress Notes (Signed)
    Subjective:  [name] [name] is a 73 y.o. female who presents to the [facility] today with a chief complaint of lower extremity swelling.   HPI: Foot swelling [name] [name] presented to clinic today noting bilateral feet swelling for the past week.  She noted she first saw the swelling in her right foot accumulating about 6 days ago.  Since that time the swelling in her right foot has significantly increased and is now difficult to fit her feet into her shoes.  She is also noted moderate pain on the sides of her feet that does not seem to be related to pressure from shoes.  1 day ago, she first noticed pain in her left foot as well.  She is not having any significant pain in the left foot.  She reports that she does have a history of blood clots in recalls having a stent placed in her leg before she attended this clinic.  She denies shortness of breath, chest pain, palpitations, tenderness of her calves.  Chief Complaint noted Review of Symptoms - see HPI PMH -peripheral vascular disease, diastolic heart failure (grade 1 diastolic dysfunction)   Objective:  Physical Exam: BP (!) 160/80 (BP Location: Left Arm, Patient Position: Sitting, Cuff Size: Normal)   Pulse 70   SpO2 98%    Gen: NAD, resting comfortably CV: RRR with no murmurs appreciated Pulm: NWOB, CTAB with no crackles, wheezes, or rhonchi.  Breathing comfortably on room air GI: Normal bowel sounds present. Soft, Nontender, Nondistended. Extremities: Her right foot has marked edema about 3+.  Clear delineation in her distal foot with edema control no further.  She has no significant tenderness to palpation of her right calf although her right foot is tender on the lateral aspect.  The left foot is notable for 2+ edema significantly reduced compared to the right.  Minimal to no pain of the left foot, no pain with palpation of the left calf.  Right calf 33 cm circumference, left calf 30.5 cm circumference. Skin: warm, dry Neuro: grossly  normal, moves all extremities Psych: Normal affect and thought content  No results found for this or any previous visit (from the past 72 hour(s)).   Assessment/Plan:  Bilateral leg edema She is not currently taking amlodipine, physical exam is not remarkable for fluid overload, breathing comfortably on room air.  No complaints of shortness of breath or chest pain, low suspicion for for PE.  Last hemoglobin 11.0.  No strong evidence of kidney or liver dysfunction.  Due to her history of blood clots and lower extremity stenting (not available in our chart).  I am concerned for DVT.  Lower extremity Dopplers have been ordered to rule out DVT before treating her lower extremity edema with compression socks.  Lower extremity Doppler was obtained and showed no evidence of DVT.  She was called and a voicemail was left informing her that there was no evidence of DVT she should follow-up by getting compression socks.  This is most likely secondary to her peripheral vascular disease.

## 2019-06-22 NOTE — Progress Notes (Signed)
Lower extremity venous has been completed.   Preliminary results in CV Proc.   Attempted to call results to MD, office closed.   Abram Sander 06/22/2019 4:56 PM

## 2019-06-22 NOTE — Patient Instructions (Addendum)
I think it was a good idea for you to come in.  I think we were able to rule out a number of causes of you leg swelling.  I do want to make sure you don't have a blood clot.  If you don't have any evidence of a blood clot, then I think that compression hose would be the most helpful thing for you to do.  We will try to schedule an appointment for your to get an ultrasound this afternoon.  If we are certain that you do not have a blood clot, you can order compression socks online.  5 or 59mm compression sock would be appropriate to help with your swelling.

## 2019-06-26 ENCOUNTER — Other Ambulatory Visit: Payer: Self-pay | Admitting: Family Medicine

## 2019-06-27 ENCOUNTER — Other Ambulatory Visit: Payer: Self-pay | Admitting: *Deleted

## 2019-06-27 ENCOUNTER — Other Ambulatory Visit: Payer: Self-pay | Admitting: Family Medicine

## 2019-06-27 DIAGNOSIS — C9110 Chronic lymphocytic leukemia of B-cell type not having achieved remission: Secondary | ICD-10-CM

## 2019-06-27 NOTE — Progress Notes (Signed)
HEMATOLOGY/ONCOLOGY CLINIC NOTE  Date of Service: 06/28/19    Patient Care Team: Shirley, [place], DO as PCP - General (Family Medicine)  CHIEF COMPLAINTS/PURPOSE OF CONSULTATION:  F/u for CLL  HISTORY OF PRESENTING ILLNESS:   [name] [name] is a wonderful 73 y.o. female smoker with history of CLL who has been referred to [place] by Dr Enid Derry, [place], DO at [facility] for evaluation and management of elevated WBC/lymphocytes.   She is accompanied by her husband. She reported to ED on 11/27/2017 with cough and abdominal pain with COPD exacerbation. Her labs on this day showed WBC elevated at 62.5. She has not previously seen a hematologist. She reports a plan to quit smoking on the first day of the new year. She reports worsening decrease in balance and states she has a cane and walker that she uses as needed. She was taking Prednisone.  No weight loss/fevers/chills/night sweats/ palpable lumps or bumps.  On review of systems, pt reports back pain, change in balance, abdominal pain, weight loss and denies changes in BM, fever and any other accompanying symptoms.  INTERVAL HISTORY:  [name] [name] is here for management and evaluation of f her Chronic Leukocytic Leukemia. The patient's last visit with [place] was on 03/29/19. The pt reports that she is doing well overall.  The pt reports that she has not developed any new concerns in the interim. She denies any fevers, chills, night sweats, unexpected or weight loss. She notes that she has continued eating well. She notes that she did have some leg swelling last week and had blood clots ruled out with a 06/22/19 US Venous BLE. She notes that she has been recommended to wear compression socks.  The pt denies noticing any new lumps or bumps.   The pt notes that she moved out of a motel into a house in the interim and is very glad about this.  Lab results today (06/28/19) of CBC w/diff and CMP is as follows: all values are WNL except for WBC at  73.6k, HGB at 11.4, RDW at 17.6, Lymphs abs are pending, Potassium at 3.3, Glucose at 114, Creatinine at 1.47, AST at 13, Alk Phos at 150, GFR at 41. 06/28/19 LDH at 210  On review of systems, pt reports stable energy levels, eating well, ankle swelling, and denies fevers, chills, night sweats, unexpected weight loss, new lumps or bumps, new back pains, new bone pains, abdominal pains, and any other symptoms.   MEDICAL HISTORY:  Past Medical History:  Diagnosis Date  . Allergy    environmental  . Asthma   . CLL (chronic lymphoblastic leukemia)   . Constipation 11/15/2011  . COPD (chronic obstructive pulmonary disease) ([place])   . Diabetes mellitus without complication ([place])   . GERD (gastroesophageal reflux disease)   . Hyperlipidemia   . Hypertension   . PVD (peripheral vascular disease) ([place])   . Stroke [facility]) 1998   in 1998 due to tumor-right side    SURGICAL HISTORY: Past Surgical History:  Procedure Laterality Date  . ABI  02/2012   ABI <0.65 BL 02/2012  . BRAIN MENINGIOMA EXCISION    . Bypass grafting of RLE for PAD claudication     . CESAREAN SECTION     1974, 78 ,80  . CHOLECYSTECTOMY, LAPAROSCOPIC    . COLONOSCOPY  2014   [place], [place]  . ESOPHAGEAL DILATION      SOCIAL HISTORY: Social History   Socioeconomic History  . Marital status: Married  Spouse name: Not on file  . Number of children: 3  . Years of education: Not on file  . Highest education level: Not on file  Occupational History  . Occupation: DISABLED    Employer: DISABLED  Social Needs  . Financial resource strain: Not on file  . Food insecurity    Worry: Not on file    Inability: Not on file  . Transportation needs    Medical: Not on file    Non-medical: Not on file  Tobacco Use  . Smoking status: Current Some Day Smoker    Packs/day: 0.25    Years: 30.00    Pack years: 7.50    Types: Cigarettes    Start date: 01/01/1976  . Smokeless tobacco: Never Used  Substance and Sexual Activity   . Alcohol use: No    Alcohol/week: 0.0 standard drinks  . Drug use: No  . Sexual activity: Yes  Lifestyle  . Physical activity    Days per week: Not on file    Minutes per session: Not on file  . Stress: Not on file  Relationships  . Social Herbalist on phone: Not on file    Gets together: Not on file    Attends religious service: Not on file    Active member of club or organization: Not on file    Attends meetings of clubs or organizations: Not on file    Relationship status: Not on file  . Intimate partner violence    Fear of current or ex partner: Not on file    Emotionally abused: Not on file    Physically abused: Not on file    Forced sexual activity: Not on file  Other Topics Concern  . Not on file  Social History Narrative   Health Care POA:    Emergency Contact: Dorthia Tout [phone] (c)   End of Life Plan:    Who lives with you: Lives with husband   Any pets: none   Diet: Patient lacks financial resources for much food. Pt reports eating what is available.   Exercise: Patient does not have an exercise plan.   Seatbelts: Patient reports wearing seatbelt when in vehicle.    [name] Exposure/Protection:   Hobbies: Bowling, computer games, Bingo      Has financial difficulties and transportation issues as she and her husband share transportation        FAMILY HISTORY: Family History  Problem Relation Age of Onset  . Heart disease Mother   . Asthma Mother   . Cancer Mother        uterine   . Depression Mother   . Heart attack Mother 18  . Hyperlipidemia Mother   . Hypertension Mother   . Stroke Mother   . Kidney disease Mother   . Heart attack Sister   . Stroke Sister   . Depression Sister   . Diabetes Sister   . Hyperlipidemia Sister   . Depression Sister   . Diabetes Sister   . Hyperlipidemia Sister   . HIV/AIDS Sister   . Diabetes Brother   . Asthma Brother   . Hypertension Brother   . Cancer Maternal Aunt        lung  . Heart  disease Maternal Grandmother   . Heart attack Maternal Grandmother   . Cancer Brother        colon  . HIV/AIDS Brother   . Colon cancer Neg Hx     ALLERGIES:  is  allergic to aspirin and baclofen.  MEDICATIONS:  Current Outpatient Medications  Medication Sig Dispense Refill  . albuterol (ACCUNEB) 0.63 MG/3ML nebulizer solution Take 3 mLs (0.63 mg total) by nebulization every 6 (six) hours as needed for wheezing. 75 mL 12  . Blood Glucose Monitoring Suppl (ONETOUCH VERIO) w/Device KIT 1 Device by Does not apply route daily. 1 kit 0  . clopidogrel (PLAVIX) 75 MG tablet Take 75 mg by mouth daily.    . cyclobenzaprine (FLEXERIL) 10 MG tablet TAKE ONE TABLET BY MOUTH AT BEDTIME 30 tablet 5  . diclofenac sodium (VOLTAREN) 1 % GEL Apply 2 g topically 4 (four) times daily. 100 g 11  . DULoxetine (CYMBALTA) 30 MG capsule Take 1 capsule (30 mg total) by mouth daily. 30 capsule 11  . enalapril (VASOTEC) 20 MG tablet TAKE ONE TABLET BY MOUTH EVERY DAY 30 tablet 11  . famotidine (PEPCID) 20 MG tablet Take 2 tablets by mouth every day 60 tablet 11  . Fluticasone-Salmeterol (ADVAIR DISKUS) 250-50 MCG/DOSE AEPB Inhale 1 puff into the lungs 2 (two) times daily. 1 each 3  . glucose blood (ONETOUCH VERIO) test strip Use to check blood sugar once a day 100 each 12  . hydrochlorothiazide (MICROZIDE) 12.5 MG capsule TAKE ONE CAPSULE BY MOUTH EVERY DAY 30 capsule 14  . Lancets (ONETOUCH ULTRASOFT) lancets Use to check blood sugar once a day 100 each 12  . Lido-Capsaicin-Men-Methyl Sal 0.5-0.035-5-20 % PTCH Apply 1 patch topically daily. As needed (Patient not taking: Reported on 04/05/2019) 30 patch 1  . metFORMIN (GLUCOPHAGE) 500 MG tablet Take 1 tablet (500 mg total) by mouth daily with breakfast. (Patient taking differently: Take 500 mg by mouth 2 (two) times daily with a meal. ) 90 tablet 3  . metoprolol tartrate (LOPRESSOR) 25 MG tablet TAKE ONE-HALF TABLET BY MOUTH TWICE DAILY 30 tablet 11  . montelukast  (SINGULAIR) 10 MG tablet TAKE ONE TABLET BY MOUTH AT BEDTIME 30 tablet 11  . nicotine polacrilex (NICORETTE) 4 MG gum Take 1 each (4 mg total) by mouth as needed for smoking cessation. (Patient not taking: Reported on 04/05/2019) 100 tablet 0  . nitroGLYCERIN (NITROSTAT) 0.4 MG SL tablet DISSOLVE ONE TABLET UNDER THE TONGUE EVERY 5 MINUTES, UP TO 3 DOSES FOR CHEST PAIN 25 tablet 11  . PROAIR HFA 108 (90 Base) MCG/ACT inhaler INHALE 2 PUFFS BY MOUTH EVERY 6 HOURS AS NEEDED FOR WHEEZING OR SHORTNESS OF BREATH 8.5 each 11  . simvastatin (ZOCOR) 20 MG tablet TAKE ONE TABLET BY MOUTH AT BEDTIME 30 tablet 11  . traMADol (ULTRAM) 50 MG tablet Take 1 tablet by mouth every day as needed for moderate pain 30 tablet 5  . umeclidinium bromide (INCRUSE ELLIPTA) 62.5 MCG/INH AEPB Inhale 1 puff into the lungs daily. (Patient not taking: Reported on 04/05/2019) 4 each 0   No current facility-administered medications for this visit.     REVIEW OF SYSTEMS:    A 10+ POINT REVIEW OF SYSTEMS WAS OBTAINED including neurology, dermatology, psychiatry, cardiac, respiratory, lymph, extremities, GI, GU, Musculoskeletal, constitutional, breasts, reproductive, HEENT.  All pertinent positives are noted in the HPI.  All others are negative.   PHYSICAL EXAMINATION: ECOG PERFORMANCE STATUS: 1 - Symptomatic but completely ambulatory  Vitals:   06/28/19 1224  BP: (!) 151/71  Pulse: 65  Resp: 18  Temp: 97.7 F (36.5 C)  SpO2: 100%   Filed Weights   06/28/19 1224  Weight: 125 lb 6.4 oz (56.9 kg)   .Body  mass index is 22.94 kg/m.  GENERAL:alert, in no acute distress and comfortable SKIN: no acute rashes, no significant lesions EYES: conjunctiva are pink and non-injected, sclera anicteric OROPHARYNX: MMM, no exudates, no oropharyngeal erythema or ulceration NECK: supple, no JVD LYMPH: Minimally palpable cervical lymph nodes, no palpable lymphadenopathy in the axillary or inguinal regions LUNGS: clear to auscultation  b/l with normal respiratory effort HEART: regular rate & rhythm ABDOMEN:  normoactive bowel sounds , non tender, not distended. No palpable hepatosplenomegaly.  Extremity: 1+ pedal edema PSYCH: alert & oriented x 3 with fluent speech NEURO: no focal motor/sensory deficits   LABORATORY DATA:  I have reviewed the data as listed  . CBC Latest Ref Rng & Units 06/28/2019 03/29/2019 03/18/2019  WBC 4.0 - 10.5 K/uL 73.6(HH) 73.0(HH) 62.8(HH)  Hemoglobin 12.0 - 15.0 g/dL 11.4(L) 11.0(L) 11.2(L)  Hematocrit 36.0 - 46.0 % 37.3 36.6 37.9  Platelets 150 - 400 K/uL 209 216 217    . CMP Latest Ref Rng & Units 06/28/2019 03/29/2019 03/18/2019  Glucose 70 - 99 mg/dL 114(H) 155(H) 112(H)  BUN 8 - 23 mg/dL _0 Creatinine 0.44 - 1.00 mg/dL 1.47(H) 1.33(H) 1.31(H)  Sodium 135 - 145 mmol/L 142 139 142  Potassium 3.5 - 5.1 mmol/L 3.3(L) 4.0 4.0  Chloride 98 - 111 mmol/L 103 102 106  CO2 22 - 32 mmol/L _1 Calcium 8.9 - 10.3 mg/dL 9.1 9.0 9.0  Total Protein 6.5 - 8.1 g/dL 6.7 6.4(L) 6.5  Total Bilirubin 0.3 - 1.2 mg/dL 0.4 0.3 0.4  Alkaline Phos 38 - 126 U/L 150(H) 115 99  AST 15 - 41 U/L 13(L) 21 18  ALT 0 - 44 U/L _2 . Lab Results  Component Value Date   LDH 210 (H) 06/28/2019    RADIOGRAPHIC STUDIES: I have personally reviewed the radiological images as listed and agreed with the findings in the report. Vas [place] Lower Extremity Venous (dvt)  Result Date: 06/24/2019  Lower Venous Study Indications: Swelling.  Comparison Study: no prior Performing Technologist: Abram Sander RVS  Examination Guidelines: A complete evaluation includes B-mode imaging, spectral Doppler, color Doppler, and power Doppler as needed of all accessible portions of each vessel. Bilateral testing is considered an integral part of a complete examination. Limited examinations for reoccurring indications may be performed as noted.  +---------+---------------+---------+-----------+----------+-------+ RIGHT     CompressibilityPhasicitySpontaneityPropertiesSummary +---------+---------------+---------+-----------+----------+-------+ CFV      Full           Yes      Yes                          +---------+---------------+---------+-----------+----------+-------+ SFJ      Full                                                 +---------+---------------+---------+-----------+----------+-------+ FV Prox  Full                                                 +---------+---------------+---------+-----------+----------+-------+ FV Mid   Full                                                 +---------+---------------+---------+-----------+----------+-------+  FV DistalFull                                                 +---------+---------------+---------+-----------+----------+-------+ PFV      Full                                                 +---------+---------------+---------+-----------+----------+-------+ POP      Full           Yes      Yes                          +---------+---------------+---------+-----------+----------+-------+ PTV      Full                                                 +---------+---------------+---------+-----------+----------+-------+ PERO     Full                                                 +---------+---------------+---------+-----------+----------+-------+   +---------+---------------+---------+-----------+----------+-------+ LEFT     CompressibilityPhasicitySpontaneityPropertiesSummary +---------+---------------+---------+-----------+----------+-------+ CFV      Full           Yes      Yes                          +---------+---------------+---------+-----------+----------+-------+ SFJ      Full                                                 +---------+---------------+---------+-----------+----------+-------+ FV Prox  Full                                                  +---------+---------------+---------+-----------+----------+-------+ FV Mid   Full                                                 +---------+---------------+---------+-----------+----------+-------+ FV DistalFull                                                 +---------+---------------+---------+-----------+----------+-------+ PFV      Full                                                 +---------+---------------+---------+-----------+----------+-------+ POP  Full           Yes      Yes                          +---------+---------------+---------+-----------+----------+-------+ PTV      Full                                                 +---------+---------------+---------+-----------+----------+-------+ PERO     Full                                                 +---------+---------------+---------+-----------+----------+-------+     Summary: Right: There is no evidence of deep vein thrombosis in the lower extremity. No cystic structure found in the popliteal fossa. Left: There is no evidence of deep vein thrombosis in the lower extremity. No cystic structure found in the popliteal fossa.  *See table(s) above for measurements and observations. Electronically signed by Curt Jews MD on 06/24/2019 at 9:08:22 AM.    Final    Surgical Pathology 12/13/2017   ASSESSMENT & PLAN:   [name] [name] is a wonderful 73 y.o. female with    1. Rai 1 Chronic Lymphocytic Leukemia  - Trisomy 12 mutation. No associated thrombocytopenia.  Mild anemia - likely from acute blood loss from significant epistaxis. No constitutional symptoms. Minimal LNaednopathy No splenomegaly  PLAN:   -Discussed pt labwork today, 06/28/19; WBC stable at 73.6k as compared to 3 months ago. HGB stable at 11.4. No thrombocytopenia. LDH stable at 210. Chemistries are stable. -The pt shows no overt clinical or lab progression of her CLL at this time. No constitutional symptoms at this time.  -No indication to initiate active treatment at this time.  -Will continue with watchful observation -Again counseled on important symptoms to monitor for  -Recommend the pt continue using her walker for balance due to concerns with neuropathy. -Recommend discussing strength and balance optimization with her PCP and consider a PT referral -Will provide pt with compression socks prescription. 06/22/19 VAS [place] BLE ruled out blood clots -Will see the pt back in 3 months   #2 .Lung nodules on CT in 12/2017. Rpt CT chest 08/09/2018 - stable  #3 Patient Active Problem List   Diagnosis Date Noted  . Bilateral leg edema 06/22/2019  . Pulmonary nodules 01/04/2018  . Mass of left lower leg 01/04/2018  . Chronic kidney disease (CKD), stage III (moderate) ([place]) 06/04/2016  . Chronic back pain 10/02/2015  . Chronic right hip pain 03/13/2012  . Episodic tension type headache 09/24/2010  . GAIT IMBALANCE 12/10/2009  . DM (diabetes mellitus), type 2 with peripheral vascular complications ([place]) 29/92/4268  . Chronic lymphocytic leukemia ([place]) 04/11/2009  . PERIPHERAL VASCULAR DISEASE 10/13/2007  . HYPERCHOLESTEROLEMIA 02/23/2007  . ANXIETY 02/23/2007  . TOBACCO DEPENDENCE 02/23/2007  . HYPERTENSION, BENIGN SYSTEMIC 02/23/2007  . COPD (chronic obstructive pulmonary disease) ([place]) 02/23/2007  . GASTROESOPHAGEAL REFLUX, NO ESOPHAGITIS 02/23/2007   Mx per PCP   RTC with Dr Irene Limbo with labs in 3 months   All of the patients questions were answered with apparent satisfaction. The patient knows to call the clinic with any problems, questions or concerns.  The total time  spent in the appt was 15 minutes and more than 50% was on counseling and direct patient cares.    Sullivan Lone MD [place] AAHIVMS [facility] Hematology/Oncology Physician [facility]  (Office):       [phone] (Work cell):  [phone] (Fax):           [phone]  I, Baldwin [place], am acting as a scribe for Dr. Sullivan Lone.   .I have reviewed the above documentation for accuracy and completeness, and I agree with the above. Brunetta Genera MD

## 2019-06-28 ENCOUNTER — Telehealth: Payer: Self-pay | Admitting: Hematology

## 2019-06-28 ENCOUNTER — Other Ambulatory Visit: Payer: Self-pay

## 2019-06-28 ENCOUNTER — Inpatient Hospital Stay (HOSPITAL_BASED_OUTPATIENT_CLINIC_OR_DEPARTMENT_OTHER): Payer: Medicare Other | Admitting: Hematology

## 2019-06-28 ENCOUNTER — Inpatient Hospital Stay: Payer: Medicare Other | Attending: Hematology

## 2019-06-28 VITALS — BP 151/71 | HR 65 | Temp 97.7°F | Resp 18 | Ht 62.0 in | Wt 125.4 lb

## 2019-06-28 DIAGNOSIS — M7989 Other specified soft tissue disorders: Secondary | ICD-10-CM

## 2019-06-28 DIAGNOSIS — D649 Anemia, unspecified: Secondary | ICD-10-CM

## 2019-06-28 DIAGNOSIS — R911 Solitary pulmonary nodule: Secondary | ICD-10-CM

## 2019-06-28 DIAGNOSIS — Z720 Tobacco use: Secondary | ICD-10-CM | POA: Insufficient documentation

## 2019-06-28 DIAGNOSIS — C9110 Chronic lymphocytic leukemia of B-cell type not having achieved remission: Secondary | ICD-10-CM | POA: Diagnosis not present

## 2019-06-28 LAB — CMP (CANCER CENTER ONLY)
ALT: 8 U/L (ref 0–44)
AST: 13 U/L — ABNORMAL LOW (ref 15–41)
Albumin: 3.9 g/dL (ref 3.5–5.0)
Alkaline Phosphatase: 150 U/L — ABNORMAL HIGH (ref 38–126)
Anion gap: 11 (ref 5–15)
BUN: 9 mg/dL (ref 8–23)
CO2: 28 mmol/L (ref 22–32)
Calcium: 9.1 mg/dL (ref 8.9–10.3)
Chloride: 103 mmol/L (ref 98–111)
Creatinine: 1.47 mg/dL — ABNORMAL HIGH (ref 0.44–1.00)
GFR, Est AFR Am: 41 mL/min — ABNORMAL LOW (ref >60)
GFR, Estimated: 36 mL/min — ABNORMAL LOW (ref >60)
Glucose, Bld: 114 mg/dL — ABNORMAL HIGH (ref 70–99)
Potassium: 3.3 mmol/L — ABNORMAL LOW (ref 3.5–5.1)
Sodium: 142 mmol/L (ref 135–145)
Total Bilirubin: 0.4 mg/dL (ref 0.3–1.2)
Total Protein: 6.7 g/dL (ref 6.5–8.1)

## 2019-06-28 LAB — CBC WITH DIFFERENTIAL (CANCER CENTER ONLY)
Abs Immature Granulocytes: 0.16 10*3/uL — ABNORMAL HIGH (ref 0.00–0.07)
Basophils Absolute: 0.1 10*3/uL (ref 0.0–0.1)
Basophils Relative: 0 %
Eosinophils Absolute: 0.3 10*3/uL (ref 0.0–0.5)
Eosinophils Relative: 0 %
HCT: 37.3 % (ref 36.0–46.0)
Hemoglobin: 11.4 g/dL — ABNORMAL LOW (ref 12.0–15.0)
Immature Granulocytes: 0 %
Lymphocytes Relative: 77 %
Lymphs Abs: 56.7 10*3/uL — ABNORMAL HIGH (ref 0.7–4.0)
MCH: 27.3 pg (ref 26.0–34.0)
MCHC: 30.6 g/dL (ref 30.0–36.0)
MCV: 89.2 fL (ref 80.0–100.0)
Monocytes Absolute: 10.9 10*3/uL — ABNORMAL HIGH (ref 0.1–1.0)
Monocytes Relative: 15 %
Neutro Abs: 5.5 10*3/uL (ref 1.7–7.7)
Neutrophils Relative %: 8 %
Platelet Count: 209 10*3/uL (ref 150–400)
RBC: 4.18 MIL/uL (ref 3.87–5.11)
RDW: 17.6 % — ABNORMAL HIGH (ref 11.5–15.5)
WBC Count: 73.6 10*3/uL (ref 4.0–10.5)
nRBC: 0 % (ref 0.0–0.2)

## 2019-06-28 LAB — LACTATE DEHYDROGENASE: LDH: 210 U/L — ABNORMAL HIGH (ref 98–192)

## 2019-06-28 NOTE — Progress Notes (Signed)
MD notified of WBC 73.5 prior to patient appt

## 2019-06-28 NOTE — Patient Instructions (Signed)
Thank you for choosing [facility] to provide your oncology and hematology care.   Should you have questions after your visit to the [facility] ([facility]), please contact this office at [phone] between 8:30 AM and 4:30 PM. Voicemails left after 4:00 PM may not be returned until the following business day. Calls received after 4:30 PM will be answered by an off-site Nurse Triage Line.    Prescription Refills:  Please have your pharmacy contact us directly for most prescription requests.  Contact the office directly for refills of narcotics (pain medications). Allow 48-72 hours for refills.  Appointments: Please contact the [facility] scheduling department [phone] for questions regarding [facility] appointment scheduling.  Contact the schedulers with any scheduling changes so that your appointment can be rescheduled in a timely manner.   Central Scheduling for [place] [phone] - Call to schedule procedures such as PET scans, CT scans, MRI, Ultrasound, etc.  To afford each patient quality time with our providers, please arrive 30 minutes before your scheduled appointment time.  If you arrive late for your appointment, you may be asked to reschedule.  We strive to give you quality time with our providers, and arriving late affects you and other patients whose appointments are after yours. If you are a no show for multiple scheduled visits, you may be dismissed from the clinic at the providers discretion.     Resources: [facility] Social Workers [phone] for additional information on assistance programs --Anne Cunningham/Abigail Elmore  [place] DSS  [phone]: Information regarding food stamps, Medicaid, and utility assistance [employer] [phone]   [place] [employer]'s shared-ride transportation service for eligible riders who have a disability that prevents them from riding the fixed route bus.   Medicare Rights Center 800-333-4114 Helps people with  Medicare understand their rights and benefits, navigate the Medicare system, and secure the quality healthcare they deserve [employer] 800-227-2345 Assists patients locate various types of support and financial assistance Cancer Care: 1-800-813-HOPE (4673) Provides financial assistance, online support groups, medication/co-pay assistance.      

## 2019-06-28 NOTE — Telephone Encounter (Signed)
Gave avs and calendar ° °

## 2019-07-03 ENCOUNTER — Other Ambulatory Visit: Payer: Self-pay | Admitting: Family Medicine

## 2019-07-20 ENCOUNTER — Other Ambulatory Visit: Payer: Self-pay | Admitting: Family Medicine

## 2019-08-01 ENCOUNTER — Other Ambulatory Visit: Payer: Self-pay | Admitting: Family Medicine

## 2019-08-03 ENCOUNTER — Other Ambulatory Visit: Payer: Self-pay | Admitting: Family Medicine

## 2019-08-10 ENCOUNTER — Ambulatory Visit
Admission: RE | Admit: 2019-08-10 | Discharge: 2019-08-10 | Disposition: A | Discharge status: Home / Self Care | Payer: Medicare Other | Source: Ambulatory Visit | Attending: Family Medicine | Admitting: Family Medicine

## 2019-08-10 DIAGNOSIS — G8929 Other chronic pain: Secondary | ICD-10-CM

## 2019-08-23 ENCOUNTER — Other Ambulatory Visit: Payer: Self-pay | Admitting: Family Medicine

## 2019-08-24 ENCOUNTER — Telehealth: Payer: Self-pay | Admitting: Family Medicine

## 2019-08-24 MED ORDER — ALBUTEROL SULFATE 0.63 MG/3ML IN NEBU: 1.0000 | INHALATION_SOLUTION | Freq: Four times a day (QID) | RESPIRATORY_TRACT | 12 refills | Status: DC | PRN

## 2019-08-24 MED ORDER — BACLOFEN 10 MG PO TABS: 10.0000 mg | ORAL_TABLET | Freq: Every day | ORAL | 0 refills | Status: DC

## 2019-08-24 NOTE — Telephone Encounter (Signed)
Called patient to discuss need for refill of Flexeril.  We will trial baclofen as she states she does not believe this caused her upset stomach, and it is safe for in her age population.  She also requested refill of her albuterol nebulizer solution.  She states she is not having to use it more she just would like to have some during the winter  [name] Jered Heiny, DO PGY-3, [place]

## 2019-08-28 ENCOUNTER — Other Ambulatory Visit: Payer: Self-pay | Admitting: Family Medicine

## 2019-08-31 ENCOUNTER — Other Ambulatory Visit: Payer: Self-pay | Admitting: Family Medicine

## 2019-09-06 ENCOUNTER — Ambulatory Visit
Admission: RE | Admit: 2019-09-06 | Discharge: 2019-09-06 | Disposition: A | Discharge status: Home / Self Care | Payer: Medicare Other | Source: Ambulatory Visit | Attending: Family Medicine | Admitting: Family Medicine

## 2019-09-06 ENCOUNTER — Other Ambulatory Visit: Payer: Medicare Other

## 2019-09-06 ENCOUNTER — Other Ambulatory Visit: Payer: Self-pay | Admitting: Family Medicine

## 2019-09-06 ENCOUNTER — Other Ambulatory Visit: Payer: Self-pay

## 2019-09-06 DIAGNOSIS — R52 Pain, unspecified: Secondary | ICD-10-CM

## 2019-09-19 ENCOUNTER — Other Ambulatory Visit: Payer: Self-pay

## 2019-09-19 ENCOUNTER — Encounter: Payer: Self-pay | Admitting: Family Medicine

## 2019-09-19 ENCOUNTER — Ambulatory Visit (INDEPENDENT_AMBULATORY_CARE_PROVIDER_SITE_OTHER): Payer: Medicare Other | Admitting: Family Medicine

## 2019-09-19 VITALS — BP 140/60 | HR 71 | Ht 62.0 in | Wt 124.4 lb

## 2019-09-19 DIAGNOSIS — F17200 Nicotine dependence, unspecified, uncomplicated: Secondary | ICD-10-CM

## 2019-09-19 DIAGNOSIS — N183 Chronic kidney disease, stage 3 unspecified: Secondary | ICD-10-CM

## 2019-09-19 DIAGNOSIS — J449 Chronic obstructive pulmonary disease, unspecified: Secondary | ICD-10-CM | POA: Diagnosis not present

## 2019-09-19 DIAGNOSIS — E1151 Type 2 diabetes mellitus with diabetic peripheral angiopathy without gangrene: Secondary | ICD-10-CM

## 2019-09-19 DIAGNOSIS — I10 Essential (primary) hypertension: Secondary | ICD-10-CM

## 2019-09-19 DIAGNOSIS — Z23 Encounter for immunization: Secondary | ICD-10-CM

## 2019-09-19 LAB — POCT GLYCOSYLATED HEMOGLOBIN (HGB A1C): HbA1c, POC (controlled diabetic range): 6.3 % (ref 0.0–7.0)

## 2019-09-19 MED ORDER — LIDO-CAPSAICIN-MEN-METHYL SAL 0.5-0.035-5-20 % EX PTCH: 1.0000 | MEDICATED_PATCH | Freq: Every day | CUTANEOUS | 1 refills | Status: DC

## 2019-09-19 MED ORDER — EMPAGLIFLOZIN 10 MG PO TABS: 10.0000 mg | ORAL_TABLET | Freq: Every day | ORAL | 3 refills | Status: DC

## 2019-09-19 MED ORDER — CYCLOBENZAPRINE HCL 5 MG PO TABS: 5.0000 mg | ORAL_TABLET | Freq: Three times a day (TID) | ORAL | 1 refills | Status: DC | PRN

## 2019-09-19 NOTE — Progress Notes (Signed)
Subjective:  Patient ID: [name] [name]  DOB: [birth year] MRN: [id number]  TYKERIA GNAU is a 73 y.o. female with a PMH of T2DM, hypertension, COPD, GERD, CKD stage III, CLL, anxiety, PVD, tobacco dependence, here today for f/u HTN, diabetes.   HPI:  Diabetes: Taking medications:metformin 500 mg daily On Plavix, and on statin Last eye exam: Patient would like referral to see an ophthalmologist and has not seen one in many years Last foot exam:up to date ROS: denies dizziness, diaphoresis, LOC, polyuria, polydipsia  Hypertension: - Medications: Hydrochlorothiazide 12.5, metoprolol 25 mg, enalapril 20 mg, - Compliance: yes - Checking BP at home: No - Denies any SOB, CP, vision changes, LE edema, medication SEs, or symptoms of hypotension  Tobacco use disorder -Smoking 5 to 7 cigarettes/day.  When her husband gets on her nerves.  Interested in quitting.  Given 1 800 quit now number.  CKD, stage III -Patient has never been seen by a kidney doctor.  When offered today in clinic she states she would like to discuss this with her husband.  In the meantime we will do risk stratification.  Patient currently on HCTZ and enalapril 20 mg and is compliant.  She does not use NSAIDs.   COPD -Patient reports that she has only had to use her nebulizer a few times since she received it.  -States that she is much improved after using Advair and Incruse daily.  Patient denies any worsening shortness of breath.  Reports that she has intermittent cough but it is not daily.  Also nonproductive cough.  Patient reports that she is now smoking about 5-7 cigarettes/day.  Again states that she only smokes when "my husband gets on her nerves"  ROS: as mentioned in HPI  Family hx: Cardiovascular disease, Diabetes   Social hx: Denies use of illicit drugs, alcohol use Smoking status reviewed-   Patient Active Problem List   Diagnosis Date Noted  . Pulmonary nodules 01/04/2018  . Mass of left lower leg  01/04/2018  . Chronic kidney disease (CKD), stage III (moderate) ([place]) 06/04/2016  . Chronic back pain 10/02/2015  . Chronic right hip pain 03/13/2012  . Episodic tension type headache 09/24/2010  . GAIT IMBALANCE 12/10/2009  . DM (diabetes mellitus), type 2 with peripheral vascular complications ([place]) [id number]  . Chronic lymphocytic leukemia ([place]) 04/11/2009  . PERIPHERAL VASCULAR DISEASE 10/13/2007  . HYPERCHOLESTEROLEMIA 02/23/2007  . ANXIETY 02/23/2007  . TOBACCO DEPENDENCE 02/23/2007  . HYPERTENSION, BENIGN SYSTEMIC 02/23/2007  . COPD (chronic obstructive pulmonary disease) ([place]) 02/23/2007  . GASTROESOPHAGEAL REFLUX, NO ESOPHAGITIS 02/23/2007     Objective:  BP 140/60   Pulse 71   Ht 5\' 2"  (1.575 m)   Wt 124 lb 6 oz (56.4 kg)   BMI 22.75 kg/m   Vitals and nursing note reviewed  General: NAD, pleasant, using a cane Cardiac: RRR, normal heart sounds, no m/r/g Pulm: normal effort, CTAB Extremities: no edema or cyanosis. WWP. Skin: warm and dry, no rashes noted Neuro: alert and oriented, no focal deficits Psych: normal affect, normal thought content  Assessment & Plan:   TOBACCO DEPENDENCE Thought she stopped, but still smoking when she's annoyed with her husband she is now up to 5 cigarettes per day.   COPD (chronic obstructive pulmonary disease) ([place]) Patient again counseled on the importance of smoking cessation.  Continue Advair and Incruse daily.  Not having any issues at this time.  Has not had to use her nebulizer.  Chronic kidney disease (CKD), stage III (  moderate) ([place]) Patient with worsening GFR.  Counseled on avoiding NSAIDs and other nephrotoxic agents.  Currently on HCTZ and enalapril.  Will start Jardiance given recent data on this drug and slowing progression of kidney disease. -Jardiance 10 mg daily -Patient may need to establish care with nephrologist but would like to discuss first with her husband  HYPERTENSION, BENIGN SYSTEMIC BP at goal today  of 140/60.  Continue current medications.  DM (diabetes mellitus), type 2 with peripheral vascular complications (HCC) GFR now 41.  We will continue to monitor closely.  Patient on 500 mg daily.  A1c well controlled at 6.3 today.  No symptoms of hypoglycemia.  We will also initiate Jardiance in order to help with patient's worsening renal function as seen under CKD plan.   [place] Marlee Trentman, DO Family Medicine Resident PGY-3

## 2019-09-19 NOTE — Assessment & Plan Note (Signed)
Thought she stopped, but still smoking when she's annoyed with her husband she is now up to 5 cigarettes per day.

## 2019-09-19 NOTE — Patient Instructions (Addendum)
Thank you for coming to see me today. It was a pleasure! Today we talked about:   Please start taking jardiance daily to help with your kidneys. Also please talk with your husband about trying to get in earlier with a kidney doctor.   Please try to stop smoking. Call [phone] for help.  Please call your GI (belly) doctor at [facility] [phone] tot alk about the food getting caught in your throat. You last saw Dr. Tarri Glenn.   Please follow-up with me in 3 months or sooner as needed.  If you have any questions or concerns, please do not hesitate to call the office at [phone].  Take Care,   [place] Alante Tolan, DO

## 2019-09-24 NOTE — Assessment & Plan Note (Signed)
GFR now 41.  We will continue to monitor closely.  Patient on 500 mg daily.  A1c well controlled at 6.3 today.  No symptoms of hypoglycemia.  We will also initiate Jardiance in order to help with patient's worsening renal function as seen under CKD plan.

## 2019-09-24 NOTE — Assessment & Plan Note (Addendum)
Patient with worsening GFR.  Counseled on avoiding NSAIDs and other nephrotoxic agents.  Currently on HCTZ and enalapril.  Will start Jardiance given recent data on this drug and slowing progression of kidney disease. -Jardiance 10 mg daily -Patient may need to establish care with nephrologist but would like to discuss first with her husband

## 2019-09-24 NOTE — Assessment & Plan Note (Signed)
BP at goal today of 140/60.  Continue current medications.

## 2019-09-24 NOTE — Assessment & Plan Note (Signed)
Patient again counseled on the importance of smoking cessation.  Continue Advair and Incruse daily.  Not having any issues at this time.  Has not had to use her nebulizer.

## 2019-09-25 ENCOUNTER — Telehealth: Payer: Self-pay | Admitting: *Deleted

## 2019-09-25 NOTE — Telephone Encounter (Signed)
[facility] dose calls to verify if patient is   - replace baclofen with cyclobenzaprine                    Or  - use both.   Please call back @ [phone] with ref # [id number]  To PCP. Christen Bame, CMA

## 2019-09-26 NOTE — Telephone Encounter (Signed)
Called and spoke with pharmacy.  Patient is only to be taking cyclobenzaprine due to an allergy to baclofen- she never started taking it.

## 2019-09-27 ENCOUNTER — Telehealth: Payer: Self-pay | Admitting: Family Medicine

## 2019-09-27 DIAGNOSIS — N1832 Chronic kidney disease, stage 3b: Secondary | ICD-10-CM

## 2019-09-27 DIAGNOSIS — E1151 Type 2 diabetes mellitus with diabetic peripheral angiopathy without gangrene: Secondary | ICD-10-CM

## 2019-09-27 DIAGNOSIS — I10 Essential (primary) hypertension: Secondary | ICD-10-CM

## 2019-09-27 NOTE — Telephone Encounter (Signed)
Will forward to MD to advise. Quadry Kampa,CMA  

## 2019-09-27 NOTE — Telephone Encounter (Signed)
Called and spoke with patient and she has agreed for nephrology referral.  Placed this and she was informed to call us back in 2 weeks if she has not heard anything.

## 2019-09-27 NOTE — Telephone Encounter (Signed)
Pt would like Dr. Enid Derry to call her. She said she needs to speak with her about being referred somewhere for her kidneys.   The best call back number is [phone]

## 2019-09-27 NOTE — Progress Notes (Signed)
HEMATOLOGY/ONCOLOGY CLINIC NOTE  Date of Service: 09/27/19    Patient Care Team: [name] [name], [place], DO as PCP - General (Family Medicine)  CHIEF COMPLAINTS/PURPOSE OF CONSULTATION:  F/u for CLL   HISTORY OF PRESENTING ILLNESS:  [name] [name] is a wonderful 73 y.o. female smoker with history of CLL who has been referred to [place] by Dr [name] [name], [place], DO at [name] for evaluation and management of elevated WBC/lymphocytes.   She is accompanied by her husband. She reported to ED on 11/27/2017 with cough and abdominal pain with COPD exacerbation. Her labs on this day showed WBC elevated at 62.5. She has not previously seen a hematologist. She reports a plan to quit smoking on the first day of the new year. She reports worsening decrease in balance and states she has a cane and walker that she uses as needed. She was taking Prednisone.  No weight loss/fevers/chills/night sweats/ palpable lumps or bumps.  On review of systems, pt reports back pain, change in balance, abdominal pain, weight loss and denies changes in BM, fever and any other accompanying symptoms.   INTERVAL HISTORY: [name] [name] is here for management and evaluation of f her Chronic Leukocytic Leukemia. The patient's last visit with [place] was on 06/28/2019. The pt reports that she is doing well overall.  The pt reports no acute new concerns. No fevers/chills/night sweats  Lab results today (09/27/19 were discussed in details with the patient.    MEDICAL HISTORY:  Past Medical History:  Diagnosis Date  . Allergy    environmental  . Asthma   . CLL (chronic lymphoblastic leukemia)   . Constipation 11/15/2011  . COPD (chronic obstructive pulmonary disease) ([place])   . Diabetes mellitus without complication ([place])   . GERD (gastroesophageal reflux disease)   . Hyperlipidemia   . Hypertension   . PVD (peripheral vascular disease) ([place])   . Stroke [name] 1998   in 1998 due to tumor-right side    SURGICAL  HISTORY: Past Surgical History:  Procedure Laterality Date  . ABI  02/2012   ABI <0.65 BL 02/2012  . BRAIN MENINGIOMA EXCISION    . Bypass grafting of RLE for PAD claudication     . CESAREAN SECTION     1974, 78 ,80  . CHOLECYSTECTOMY, LAPAROSCOPIC    . COLONOSCOPY  2014   [place], [place]  . ESOPHAGEAL DILATION      SOCIAL HISTORY: Social History   Socioeconomic History  . Marital status: Married    Spouse name: Not on file  . Number of children: 3  . Years of education: Not on file  . Highest education level: Not on file  Occupational History  . Occupation: DISABLED    Employer: DISABLED  Social Needs  . Financial resource strain: Not on file  . Food insecurity    Worry: Not on file    Inability: Not on file  . Transportation needs    Medical: Not on file    Non-medical: Not on file  Tobacco Use  . Smoking status: Current Some Day Smoker    Packs/day: 0.25    Years: 30.00    Pack years: 7.50    Types: Cigarettes    Start date: 01/01/1976  . Smokeless tobacco: Never Used  Substance and Sexual Activity  . Alcohol use: No    Alcohol/week: 0.0 standard drinks  . Drug use: No  . Sexual activity: Yes  Lifestyle  . Physical activity    Days per week:  Not on file    Minutes per session: Not on file  . Stress: Not on file  Relationships  . Social Herbalist on phone: Not on file    Gets together: Not on file    Attends religious service: Not on file    Active member of club or organization: Not on file    Attends meetings of clubs or organizations: Not on file    Relationship status: Not on file  . Intimate partner violence    Fear of current or ex partner: Not on file    Emotionally abused: Not on file    Physically abused: Not on file    Forced sexual activity: Not on file  Other Topics Concern  . Not on file  Social History Narrative   Health Care POA:    Emergency Contact: [name] [name] [phone] (c)   End of Life Plan:    Who lives with you:  Lives with husband   Any pets: none   Diet: Patient lacks financial resources for much food. Pt reports eating what is available.   Exercise: Patient does not have an exercise plan.   Seatbelts: Patient reports wearing seatbelt when in vehicle.    [name] Exposure/Protection:   Hobbies: Bowling, computer games, Bingo      Has financial difficulties and transportation issues as she and her husband share transportation        FAMILY HISTORY: Family History  Problem Relation Age of Onset  . Heart disease Mother   . Asthma Mother   . Cancer Mother        uterine   . Depression Mother   . Heart attack Mother 53  . Hyperlipidemia Mother   . Hypertension Mother   . Stroke Mother   . Kidney disease Mother   . Heart attack Sister   . Stroke Sister   . Depression Sister   . Diabetes Sister   . Hyperlipidemia Sister   . Depression Sister   . Diabetes Sister   . Hyperlipidemia Sister   . HIV/AIDS Sister   . Diabetes Brother   . Asthma Brother   . Hypertension Brother   . Cancer Maternal Aunt        lung  . Heart disease Maternal Grandmother   . Heart attack Maternal Grandmother   . Cancer Brother        colon  . HIV/AIDS Brother   . Colon cancer Neg Hx     ALLERGIES:  is allergic to aspirin and baclofen.  MEDICATIONS:  Current Outpatient Medications  Medication Sig Dispense Refill  . albuterol (ACCUNEB) 0.63 MG/3ML nebulizer solution Take 3 mLs (0.63 mg total) by nebulization every 6 (six) hours as needed for wheezing. 75 mL 12  . Blood Glucose Monitoring Suppl (ONETOUCH VERIO) w/Device KIT 1 Device by Does not apply route daily. 1 kit 0  . clopidogrel (PLAVIX) 75 MG tablet Take 75 mg by mouth daily.    . cyclobenzaprine (FLEXERIL) 5 MG tablet Take 1 tablet (5 mg total) by mouth 3 (three) times daily as needed for muscle spasms. 30 tablet 1  . diclofenac sodium (VOLTAREN) 1 % GEL Apply 2 g topically 4 (four) times daily. 100 g 11  . DULoxetine (CYMBALTA) 30 MG capsule Take 1  capsule (30 mg total) by mouth daily. 30 capsule 11  . empagliflozin (JARDIANCE) 10 MG TABS tablet Take 10 mg by mouth daily. 90 tablet 3  . enalapril (VASOTEC) 20 MG tablet TAKE  ONE TABLET BY MOUTH EVERY DAY 30 tablet 11  . famotidine (PEPCID) 20 MG tablet Take 2 tablets by mouth every day 60 tablet 11  . glucose blood (ONETOUCH VERIO) test strip Use to check blood sugar once a day 100 each 12  . hydrochlorothiazide (MICROZIDE) 12.5 MG capsule TAKE ONE CAPSULE BY MOUTH EVERY DAY 30 capsule 14  . Lancets (ONETOUCH ULTRASOFT) lancets Use to check blood sugar once a day 100 each 12  . Lido-Capsaicin-Men-Methyl Sal 0.5-0.035-5-20 % PTCH Apply 1 patch topically daily. As needed 30 patch 1  . metFORMIN (GLUCOPHAGE) 500 MG tablet Take 1 tablet (500 mg total) by mouth daily with breakfast. (Patient taking differently: Take 500 mg by mouth 2 (two) times daily with a meal. ) 90 tablet 3  . metoprolol tartrate (LOPRESSOR) 25 MG tablet TAKE ONE-HALF TABLET BY MOUTH TWICE DAILY 30 tablet 11  . montelukast (SINGULAIR) 10 MG tablet TAKE ONE TABLET BY MOUTH AT BEDTIME 30 tablet 11  . nitroGLYCERIN (NITROSTAT) 0.4 MG SL tablet Dissolve 1 tablet under tongue every 5 minutes, up to 3 doses for chest pain 25 tablet 11  . PROAIR HFA 108 (90 Base) MCG/ACT inhaler INHALE 2 PUFFS BY MOUTH EVERY 6 HOURS AS NEEDED FOR WHEEZING OR SHORTNESS OF BREATH 8.5 each 11  . simvastatin (ZOCOR) 20 MG tablet TAKE ONE TABLET BY MOUTH AT BEDTIME 30 tablet 11  . traMADol (ULTRAM) 50 MG tablet Take 1 tablet by mouth every day as needed for moderate pain 30 tablet 2  . WIXELA INHUB 250-50 MCG/DOSE AEPB INHALE 1 PUFF BY MOUTH TWICE DAILY 60 each 11   No current facility-administered medications for this visit.     REVIEW OF SYSTEMS:   A 10+ POINT REVIEW OF SYSTEMS WAS OBTAINED including neurology, dermatology, psychiatry, cardiac, respiratory, lymph, extremities, GI, GU, Musculoskeletal, constitutional, breasts, reproductive, HEENT.  All  pertinent positives are noted in the HPI.  All others are negative.    PHYSICAL EXAMINATION: ECOG FS:2 - Symptomatic, <50% confined to bed  There were no vitals filed for this visit. Wt Readings from Last 3 Encounters:  09/19/19 124 lb 6 oz (56.4 kg)  06/28/19 125 lb 6.4 oz (56.9 kg)  03/29/19 129 lb 3.2 oz (58.6 kg)   There is no height or weight on file to calculate BMI.    GENERAL:alert, in no acute distress and comfortable SKIN: no acute rashes, no significant lesions EYES: conjunctiva are pink and non-injected, sclera anicteric OROPHARYNX: MMM, no exudates, no oropharyngeal erythema or ulceration NECK: supple, no JVD LYMPH:  no palpable lymphadenopathy in the cervical, axillary or inguinal regions LUNGS: clear to auscultation b/l with normal respiratory effort HEART: regular rate & rhythm ABDOMEN:  normoactive bowel sounds , non tender, not distended. Extremity: no pedal edema PSYCH: alert & oriented x 3 with fluent speech NEURO: no focal motor/sensory deficits   LABORATORY DATA:  I have reviewed the data as listed  . CBC Latest Ref Rng & Units 09/28/2019 06/28/2019 03/29/2019  WBC 4.0 - 10.5 K/uL 68.5(HH) 73.6(HH) 73.0(HH)  Hemoglobin 12.0 - 15.0 g/dL 12.0 11.4(L) 11.0(L)  Hematocrit 36.0 - 46.0 % 38.6 37.3 36.6  Platelets 150 - 400 K/uL 225 209 216    . CMP Latest Ref Rng & Units 09/28/2019 06/28/2019 03/29/2019  Glucose 70 - 99 mg/dL 96 114(H) 155(H)  BUN 8 - 23 mg/dL _0 Creatinine 0.44 - 1.00 mg/dL 1.25(H) 1.47(H) 1.33(H)  Sodium 135 - 145 mmol/L 142 142 139  Potassium 3.5 - 5.1 mmol/L 3.6 3.3(L) 4.0  Chloride 98 - 111 mmol/L 103 103 102  CO2 22 - 32 mmol/L _0 Calcium 8.9 - 10.3 mg/dL 9.6 9.1 9.0  Total Protein 6.5 - 8.1 g/dL 6.8 6.7 6.4(L)  Total Bilirubin 0.3 - 1.2 mg/dL 0.5 0.4 0.3  Alkaline Phos 38 - 126 U/L 142(H) 150(H) 115  AST 15 - 41 U/L 20 13(L) 21  ALT 0 - 44 U/L _1 . Lab Results  Component Value Date   LDH 266 (H) 09/28/2019         . Lab Results  Component Value Date   LDH 210 (H) 06/28/2019    RADIOGRAPHIC STUDIES: I have personally reviewed the radiological images as listed and agreed with the findings in the report. Dg Lumbar Spine 2-3 Views  Result Date: 09/06/2019 CLINICAL DATA:  Right-sided low back pain with bilateral leg weakness 2-3 months. No injury. EXAM: LUMBAR SPINE - 2-3 VIEW COMPARISON:  11/06/2015 FINDINGS: Vertebral body alignment and heights are normal. There is minimal spondylosis of the lumbar spine to include mild facet arthropathy over the lower lumbar spine. Disc space heights are preserved. No compression fracture or spondylolisthesis. Remainder of the exam is unchanged. IMPRESSION: Mild spondylosis of the lumbar spine.  No acute findings. Electronically Signed   By: Marin Olp M.D.   On: 09/06/2019 15:14   Surgical Pathology 12/13/2017   ASSESSMENT & PLAN:   [name] [name] is a wonderful 73 y.o. female with    1. Rai 1 Chronic Lymphocytic Leukemia  - Trisomy 12 mutation. No associated thrombocytopenia.  Mild anemia - likely from acute blood loss from significant epistaxis. No constitutional symptoms. Minimal LNaednopathy No splenomegaly  #2 .Lung nodules on CT in 12/2017. Rpt CT chest 08/09/2018 - stable  #3 Patient Active Problem List   Diagnosis Date Noted  . Pulmonary nodules 01/04/2018  . Mass of left lower leg 01/04/2018  . Chronic kidney disease (CKD), stage III (moderate) ([place]) 06/04/2016  . Chronic back pain 10/02/2015  . Chronic right hip pain 03/13/2012  . Episodic tension type headache 09/24/2010  . GAIT IMBALANCE 12/10/2009  . DM (diabetes mellitus), type 2 with peripheral vascular complications ([place]) 48/18/5909  . Chronic lymphocytic leukemia ([place]) 04/11/2009  . PERIPHERAL VASCULAR DISEASE 10/13/2007  . HYPERCHOLESTEROLEMIA 02/23/2007  . ANXIETY 02/23/2007  . TOBACCO DEPENDENCE 02/23/2007  . HYPERTENSION, BENIGN SYSTEMIC 02/23/2007  . COPD (chronic  obstructive pulmonary disease) ([place]) 02/23/2007  . GASTROESOPHAGEAL REFLUX, NO ESOPHAGITIS 02/23/2007     PLAN: -Discussed pt labwork today, 09/27/2019; WBC counts stable. -no anemia or thrombocytopenia -no lab or clinical evidence of significant progression of CLL at this time. -no indication to start CLL specific treatments at this time.   FOLLOW UP: RTC with Dr Irene Limbo with labs in 14 weeks  The total time spent in the appt was 15 minutes and more than 50% was on counseling and direct patient cares.  All of the patient's questions were answered with apparent satisfaction. The patient knows to call the clinic with any problems, questions or concerns.    Sullivan Lone MD [place] AAHIVMS [facility] Advanced Medical Imaging Surgery [name] Hematology/Oncology Physician [facility]  (Office):       [phone] (Work cell):  [phone] (Fax):           [phone]  I, Baldwin [place], am acting as a scribe for Dr. Sullivan Lone.   .I have reviewed the above documentation for accuracy and completeness,  and I agree with the above. Brunetta Genera MD

## 2019-09-28 ENCOUNTER — Other Ambulatory Visit: Payer: Self-pay

## 2019-09-28 ENCOUNTER — Inpatient Hospital Stay: Payer: Medicare Other | Attending: Hematology | Admitting: Hematology

## 2019-09-28 ENCOUNTER — Inpatient Hospital Stay: Payer: Medicare Other

## 2019-09-28 ENCOUNTER — Telehealth: Payer: Self-pay | Admitting: *Deleted

## 2019-09-28 VITALS — BP 194/80 | HR 64 | Temp 98.5°F | Resp 17 | Ht 62.0 in | Wt 126.4 lb

## 2019-09-28 DIAGNOSIS — Z720 Tobacco use: Secondary | ICD-10-CM | POA: Diagnosis not present

## 2019-09-28 DIAGNOSIS — C9110 Chronic lymphocytic leukemia of B-cell type not having achieved remission: Secondary | ICD-10-CM

## 2019-09-28 DIAGNOSIS — R911 Solitary pulmonary nodule: Secondary | ICD-10-CM | POA: Diagnosis not present

## 2019-09-28 DIAGNOSIS — M7989 Other specified soft tissue disorders: Secondary | ICD-10-CM

## 2019-09-28 LAB — CBC WITH DIFFERENTIAL/PLATELET
Abs Immature Granulocytes: 0 10*3/uL (ref 0.00–0.07)
Basophils Absolute: 0 10*3/uL (ref 0.0–0.1)
Basophils Relative: 0 %
Eosinophils Absolute: 0.7 10*3/uL — ABNORMAL HIGH (ref 0.0–0.5)
Eosinophils Relative: 1 %
HCT: 38.6 % (ref 36.0–46.0)
Hemoglobin: 12 g/dL (ref 12.0–15.0)
Lymphocytes Relative: 89 %
Lymphs Abs: 61 10*3/uL — ABNORMAL HIGH (ref 0.7–4.0)
MCH: 27.8 pg (ref 26.0–34.0)
MCHC: 31.1 g/dL (ref 30.0–36.0)
MCV: 89.4 fL (ref 80.0–100.0)
Monocytes Absolute: 2.7 10*3/uL — ABNORMAL HIGH (ref 0.1–1.0)
Monocytes Relative: 4 %
Neutro Abs: 4.1 10*3/uL (ref 1.7–17.7)
Neutrophils Relative %: 6 %
Platelets: 225 10*3/uL (ref 150–400)
RBC: 4.32 MIL/uL (ref 3.87–5.11)
RDW: 17.1 % — ABNORMAL HIGH (ref 11.5–15.5)
WBC: 68.5 10*3/uL (ref 4.0–10.5)
nRBC: 0 % (ref 0.0–0.2)

## 2019-09-28 LAB — CMP (CANCER CENTER ONLY)
ALT: 17 U/L (ref 0–44)
AST: 20 U/L (ref 15–41)
Albumin: 4.1 g/dL (ref 3.5–5.0)
Alkaline Phosphatase: 142 U/L — ABNORMAL HIGH (ref 38–126)
Anion gap: 10 (ref 5–15)
BUN: 15 mg/dL (ref 8–23)
CO2: 29 mmol/L (ref 22–32)
Calcium: 9.6 mg/dL (ref 8.9–10.3)
Chloride: 103 mmol/L (ref 98–111)
Creatinine: 1.25 mg/dL — ABNORMAL HIGH (ref 0.44–1.00)
GFR, Est AFR Am: 50 mL/min — ABNORMAL LOW (ref >60)
GFR, Estimated: 43 mL/min — ABNORMAL LOW (ref >60)
Glucose, Bld: 96 mg/dL (ref 70–99)
Potassium: 3.6 mmol/L (ref 3.5–5.1)
Sodium: 142 mmol/L (ref 135–145)
Total Bilirubin: 0.5 mg/dL (ref 0.3–1.2)
Total Protein: 6.8 g/dL (ref 6.5–8.1)

## 2019-09-28 LAB — LACTATE DEHYDROGENASE: LDH: 266 U/L — ABNORMAL HIGH (ref 98–192)

## 2019-09-28 NOTE — Telephone Encounter (Signed)
CRITICAL VALUE:  WBC 68.5 DATE & TIME NOTIFIED: 09/28/2019, 10:28AM MESSENGER (representative from lab): Rutherford Nail MD NOTIFIED: Dr. Irene Limbo  TIME OF NOTIFICATION: 10:45AM RESPONSE: Acknowledged/patient evaluated in scheduled appt

## 2019-09-29 ENCOUNTER — Other Ambulatory Visit: Payer: Self-pay | Admitting: Family Medicine

## 2019-10-01 ENCOUNTER — Other Ambulatory Visit: Payer: Self-pay | Admitting: Family Medicine

## 2019-10-01 ENCOUNTER — Telehealth: Payer: Self-pay | Admitting: Hematology

## 2019-10-01 NOTE — Telephone Encounter (Signed)
Scheduled appt per 10/2 los.  Patient is aware of the appt date and time.

## 2019-10-09 ENCOUNTER — Telehealth: Payer: Self-pay

## 2019-10-09 NOTE — Telephone Encounter (Signed)
Pharmacy calling. Need carnification of grams for pro air. Needs to be 8.5 not 18 also needs DAW changed. Ottis Stain, CMA

## 2019-10-10 NOTE — Telephone Encounter (Signed)
Called in clarification.

## 2019-10-15 IMAGING — MG DIGITAL SCREENING BILATERAL MAMMOGRAM WITH CAD
4 series · 4 of 4 positions shown · non-contrast
Comparison: Previous exam(s).

CLINICAL DATA: Screening.

EXAM:
DIGITAL SCREENING BILATERAL MAMMOGRAM WITH CAD

[R CC]
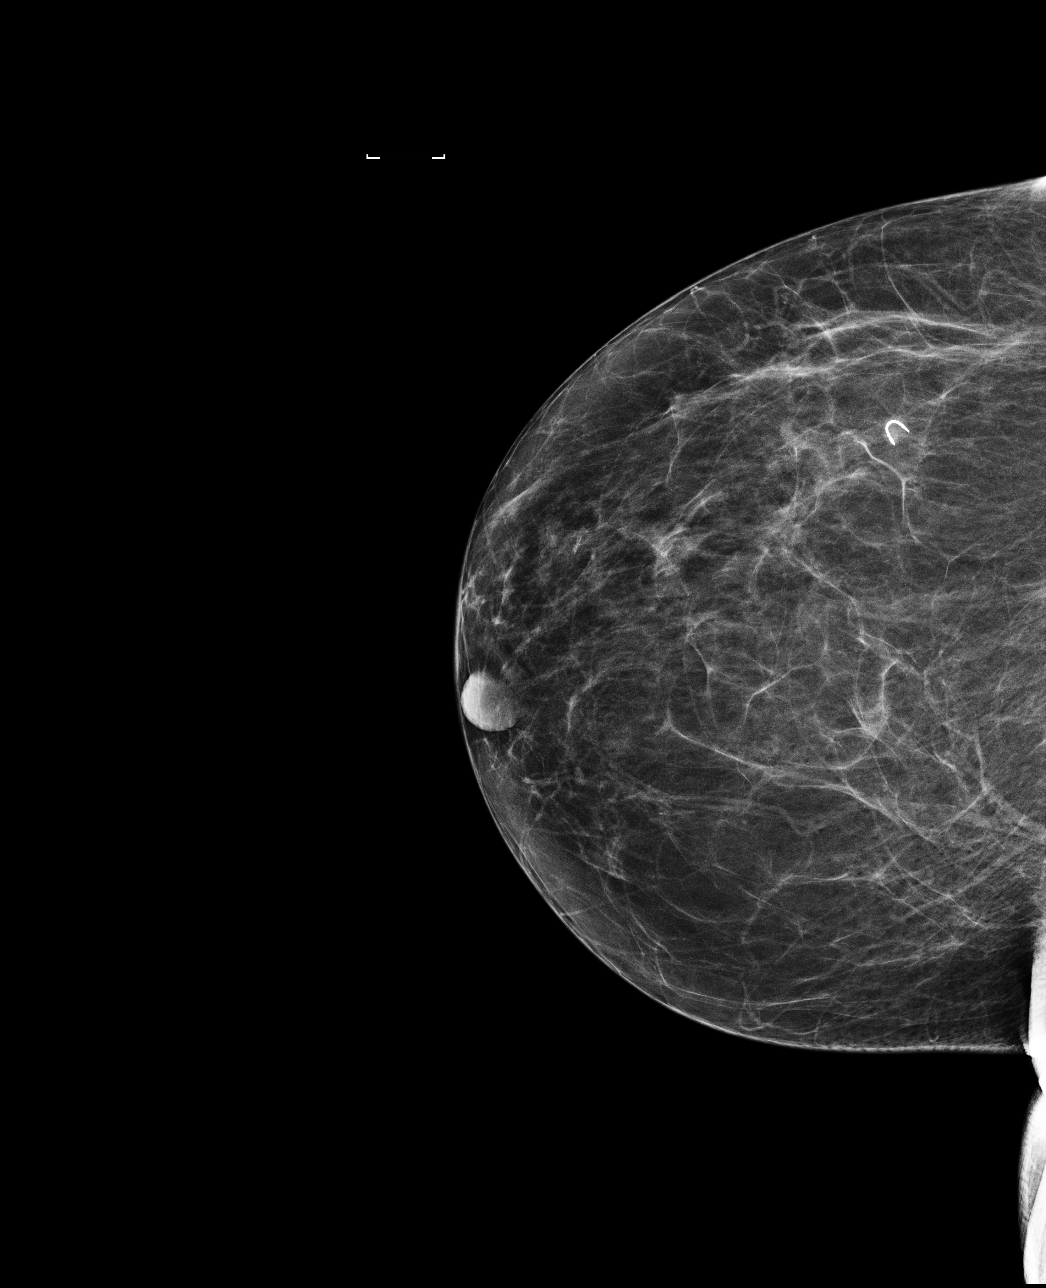

[L CC]
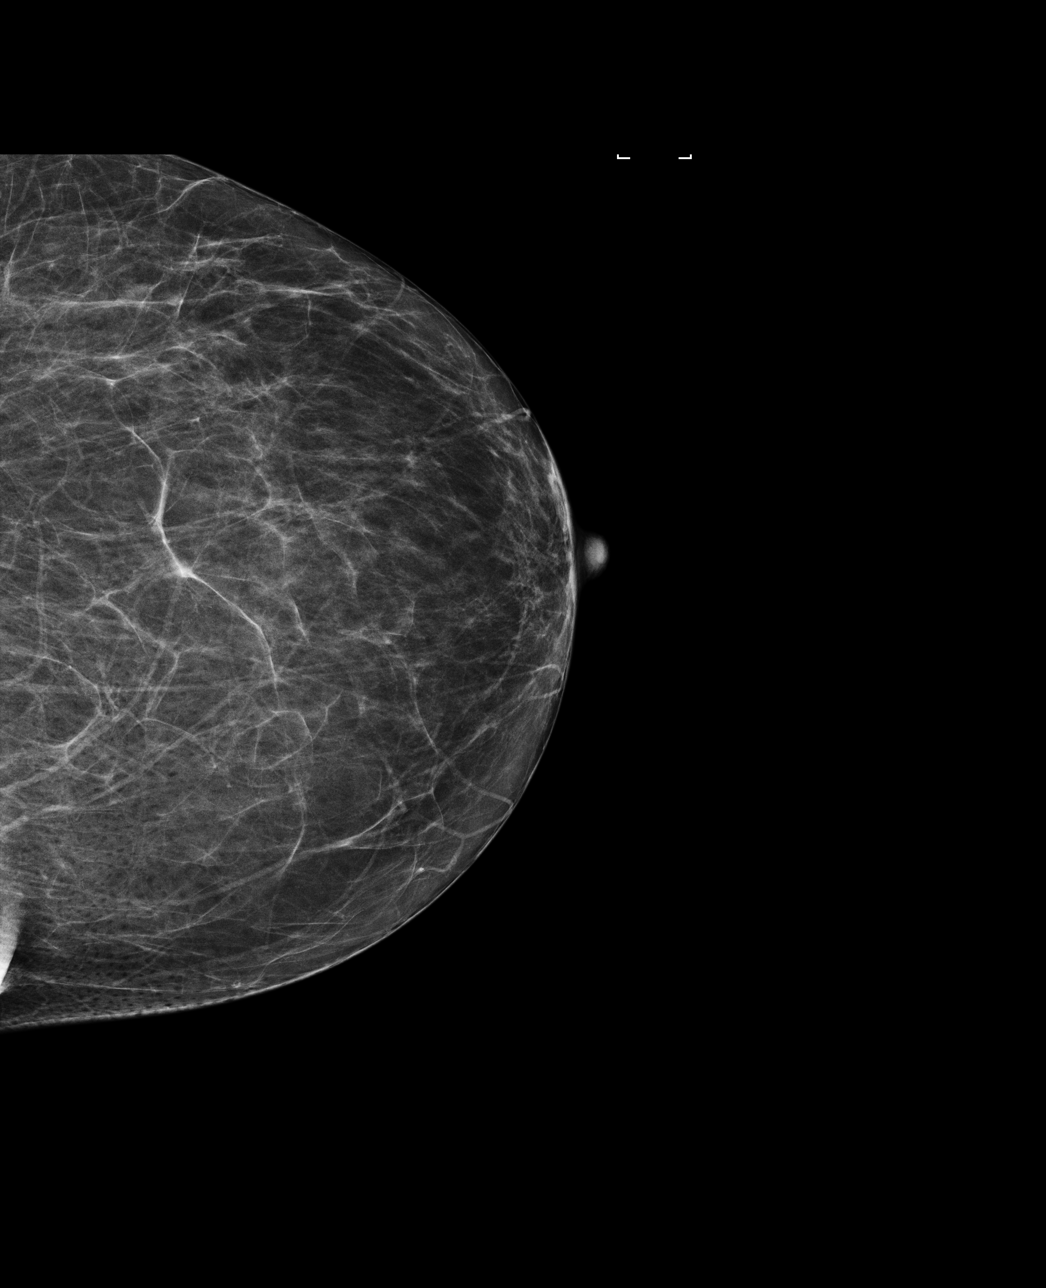

[R MLO]
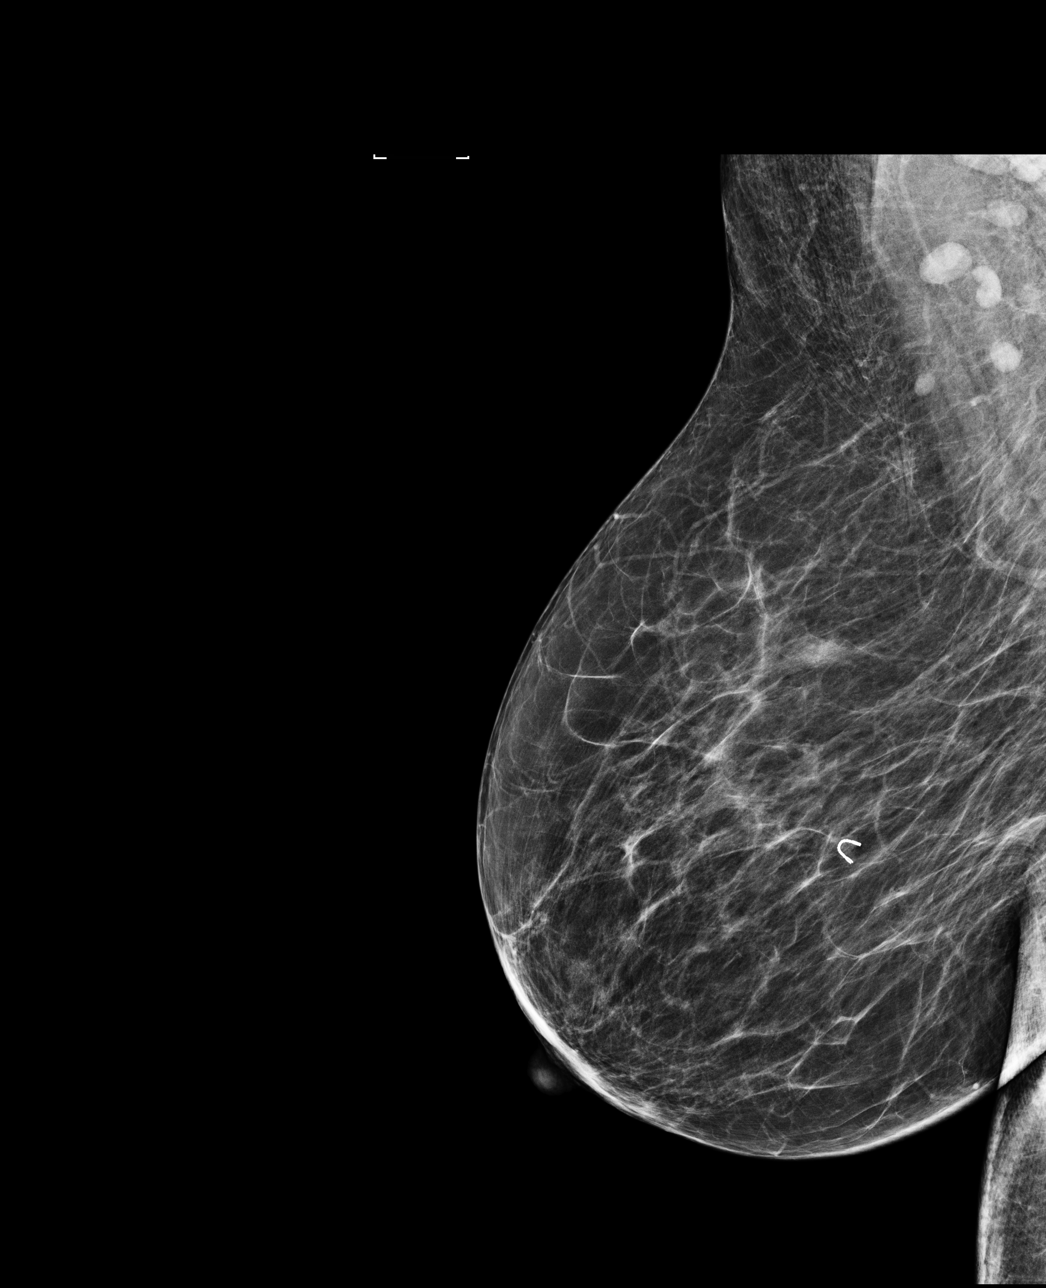

[L MLO]
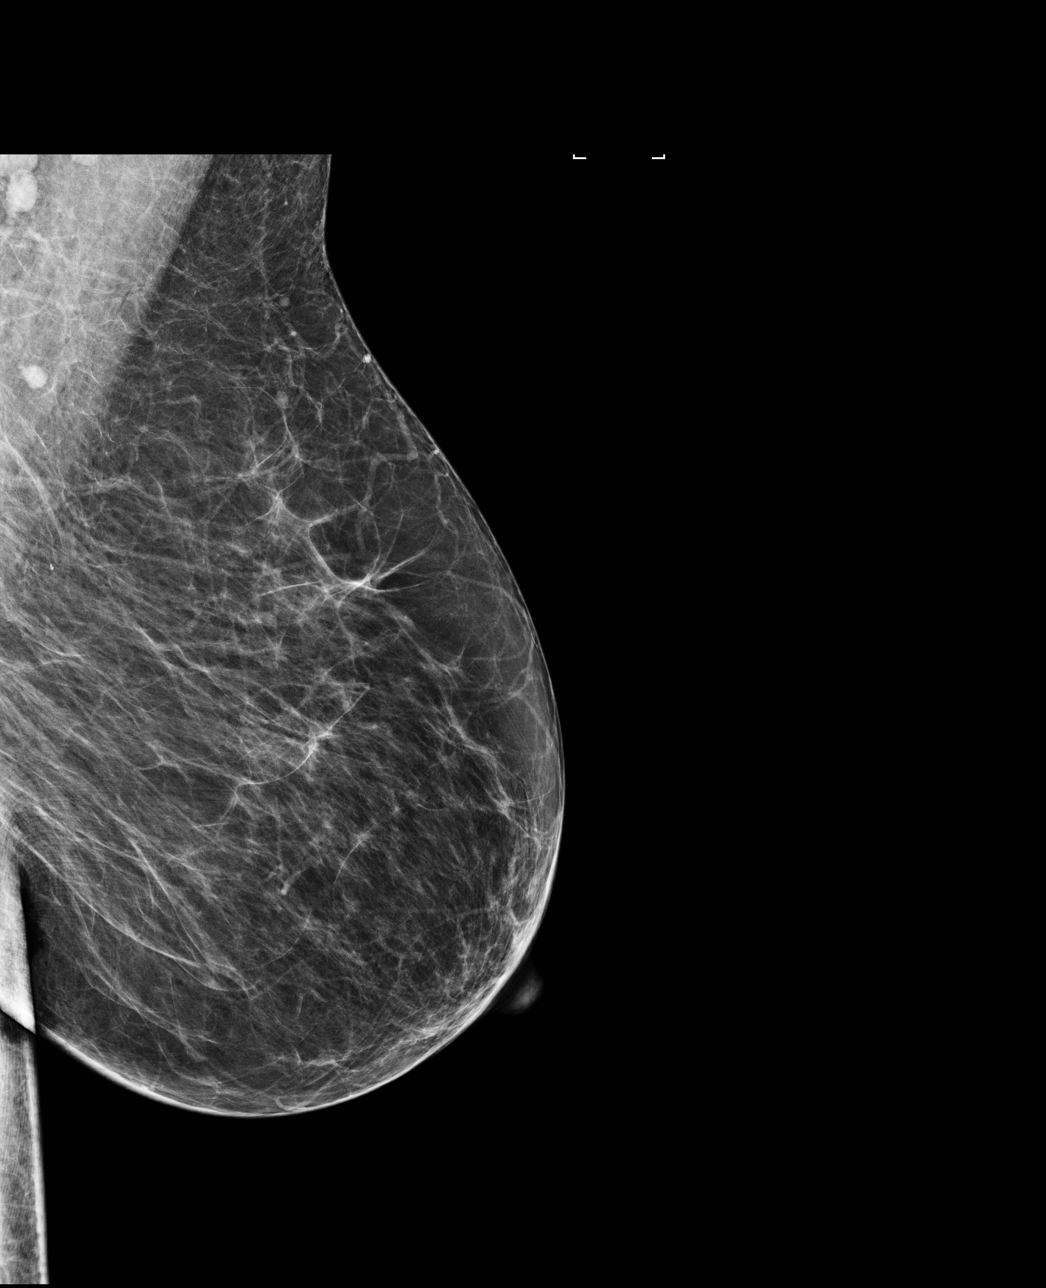

[4 of 4 positions shown; findings below may reference images not displayed]

ACR Breast Density Category b: There are scattered areas of
fibroglandular density.
FINDINGS: There are no findings suspicious for malignancy. Images were
processed with CAD.
IMPRESSION: No mammographic evidence of malignancy. A result letter of this
screening mammogram will be mailed directly to the patient.

RECOMMENDATION:
Screening mammogram in one year. (Code:AS-G-LCT)

BI-RADS CATEGORY  1: Negative.

## 2019-10-30 ENCOUNTER — Other Ambulatory Visit: Payer: Self-pay | Admitting: Family Medicine

## 2019-11-12 ENCOUNTER — Other Ambulatory Visit: Payer: Self-pay | Admitting: Family Medicine

## 2019-11-21 ENCOUNTER — Other Ambulatory Visit: Payer: Self-pay | Admitting: Family Medicine

## 2020-01-04 ENCOUNTER — Telehealth: Payer: Self-pay | Admitting: Hematology

## 2020-01-04 ENCOUNTER — Inpatient Hospital Stay (HOSPITAL_BASED_OUTPATIENT_CLINIC_OR_DEPARTMENT_OTHER): Payer: Medicare Other | Admitting: Hematology

## 2020-01-04 ENCOUNTER — Encounter: Payer: Self-pay | Admitting: Hematology

## 2020-01-04 ENCOUNTER — Other Ambulatory Visit: Payer: Self-pay

## 2020-01-04 ENCOUNTER — Inpatient Hospital Stay: Payer: Medicare Other | Attending: Hematology

## 2020-01-04 VITALS — BP 141/68 | HR 60 | Temp 98.2°F | Resp 17 | Ht 62.0 in | Wt 133.5 lb

## 2020-01-04 DIAGNOSIS — M7989 Other specified soft tissue disorders: Secondary | ICD-10-CM | POA: Insufficient documentation

## 2020-01-04 DIAGNOSIS — F17210 Nicotine dependence, cigarettes, uncomplicated: Secondary | ICD-10-CM | POA: Insufficient documentation

## 2020-01-04 DIAGNOSIS — C9110 Chronic lymphocytic leukemia of B-cell type not having achieved remission: Secondary | ICD-10-CM | POA: Diagnosis present

## 2020-01-04 DIAGNOSIS — M545 Low back pain: Secondary | ICD-10-CM | POA: Diagnosis not present

## 2020-01-04 DIAGNOSIS — R911 Solitary pulmonary nodule: Secondary | ICD-10-CM | POA: Insufficient documentation

## 2020-01-04 LAB — CMP (CANCER CENTER ONLY)
ALT: 15 U/L (ref 0–44)
AST: 16 U/L (ref 15–41)
Albumin: 4.1 g/dL (ref 3.5–5.0)
Alkaline Phosphatase: 154 U/L — ABNORMAL HIGH (ref 38–126)
Anion gap: 12 (ref 5–15)
BUN: 20 mg/dL (ref 8–23)
CO2: 28 mmol/L (ref 22–32)
Calcium: 9.6 mg/dL (ref 8.9–10.3)
Chloride: 101 mmol/L (ref 98–111)
Creatinine: 1.59 mg/dL — ABNORMAL HIGH (ref 0.44–1.00)
GFR, Est AFR Am: 37 mL/min — ABNORMAL LOW (ref >60)
GFR, Estimated: 32 mL/min — ABNORMAL LOW (ref >60)
Glucose, Bld: 108 mg/dL — ABNORMAL HIGH (ref 70–99)
Potassium: 4 mmol/L (ref 3.5–5.1)
Sodium: 141 mmol/L (ref 135–145)
Total Bilirubin: 0.2 mg/dL — ABNORMAL LOW (ref 0.3–1.2)
Total Protein: 6.9 g/dL (ref 6.5–8.1)

## 2020-01-04 LAB — CBC WITH DIFFERENTIAL/PLATELET
Abs Immature Granulocytes: 0.29 10*3/uL — ABNORMAL HIGH (ref 0.00–0.07)
Basophils Absolute: 0.1 10*3/uL (ref 0.0–0.1)
Basophils Relative: 0 %
Eosinophils Absolute: 0.3 10*3/uL (ref 0.0–0.5)
Eosinophils Relative: 0 %
HCT: 40.5 % (ref 36.0–46.0)
Hemoglobin: 12.3 g/dL (ref 12.0–15.0)
Immature Granulocytes: 0 %
Lymphocytes Relative: 83 %
Lymphs Abs: 88.2 10*3/uL — ABNORMAL HIGH (ref 0.7–4.0)
MCH: 27.5 pg (ref 26.0–34.0)
MCHC: 30.4 g/dL (ref 30.0–36.0)
MCV: 90.6 fL (ref 80.0–100.0)
Monocytes Absolute: 11.5 10*3/uL — ABNORMAL HIGH (ref 0.1–1.0)
Monocytes Relative: 11 %
Neutro Abs: 6.6 10*3/uL (ref 1.7–7.7)
Neutrophils Relative %: 6 %
Platelets: 257 10*3/uL (ref 150–400)
RBC: 4.47 MIL/uL (ref 3.87–5.11)
RDW: 18 % — ABNORMAL HIGH (ref 11.5–15.5)
WBC: 106.9 10*3/uL (ref 4.0–10.5)
nRBC: 0 % (ref 0.0–0.2)

## 2020-01-04 LAB — LACTATE DEHYDROGENASE: LDH: 247 U/L — ABNORMAL HIGH (ref 98–192)

## 2020-01-04 NOTE — Progress Notes (Signed)
HEMATOLOGY/ONCOLOGY CLINIC NOTE  Date of Service: 01/04/20    Patient Care Team: Shirley, [place], DO as PCP - General (Family Medicine)  CHIEF COMPLAINTS/PURPOSE OF CONSULTATION:  F/u for CLL   HISTORY OF PRESENTING ILLNESS:  [name] [name] is a wonderful 74 y.o. female smoker with history of CLL who has been referred to [place] by Dr Enid Derry, [place], DO at [facility] for evaluation and management of elevated WBC/lymphocytes.   She is accompanied by her husband. She reported to ED on 11/27/2017 with cough and abdominal pain with COPD exacerbation. Her labs on this day showed WBC elevated at 62.5. She has not previously seen a hematologist. She reports a plan to quit smoking on the first day of the new year. She reports worsening decrease in balance and states she has a cane and walker that she uses as needed. She was taking Prednisone.  No weight loss/fevers/chills/night sweats/ palpable lumps or bumps.  On review of systems, pt reports back pain, change in balance, abdominal pain, weight loss and denies changes in BM, fever and any other accompanying symptoms.   INTERVAL HISTORY:  [name] [name] is here for management and evaluation of f her Chronic Leukocytic Leukemia. The patient's last visit with [place] was on 09/28/2019. The pt reports that she is doing well overall.  The pt reports she does not have any new concerns.  She is smoking approximately 5 cigarettes a day or a pack of cigarettes every 4 days.   She is using compression socks for her leg swelling.   Lab results today (01/04/20) of CBC w/diff and CMP is as follows: all values are WNL except for WBC at 106.9, Lymphs Abs at 88.2, Monocytes Absolute at 11.5, Abs Immature Granulocytes at 0.29, Glucose Bld at 108, Creatinine at 1.59, Alkaline Phosphatase at 154, Total Bilirubin at 0.2, GFR Est Non Af Am at 32, GFR Est AFR Am at 37, LDH at 247   On review of systems, pt reports lower back pain and denies fevers,  chills,night sweats, weight loss, new lumps or bumps, abdominal pain, constipation, dysuria, and any other symptoms.    MEDICAL HISTORY:  Past Medical History:  Diagnosis Date  . Allergy    environmental  . Asthma   . CLL (chronic lymphoblastic leukemia)   . Constipation 11/15/2011  . COPD (chronic obstructive pulmonary disease) ([place])   . Diabetes mellitus without complication ([place])   . GERD (gastroesophageal reflux disease)   . Hyperlipidemia   . Hypertension   . PVD (peripheral vascular disease) ([place])   . Stroke [facility]) 1998   in 1998 due to tumor-right side    SURGICAL HISTORY: Past Surgical History:  Procedure Laterality Date  . ABI  02/2012   ABI <0.65 BL 02/2012  . BRAIN MENINGIOMA EXCISION    . Bypass grafting of RLE for PAD claudication     . CESAREAN SECTION     1974, 78 ,80  . CHOLECYSTECTOMY, LAPAROSCOPIC    . COLONOSCOPY  2014   [place], [place]  . ESOPHAGEAL DILATION      SOCIAL HISTORY: Social History   Socioeconomic History  . Marital status: Married    Spouse name: Not on file  . Number of children: 3  . Years of education: Not on file  . Highest education level: Not on file  Occupational History  . Occupation: DISABLED    Employer: DISABLED  Tobacco Use  . Smoking status: Current Some Day Smoker    Packs/day: 0.25  Years: 30.00    Pack years: 7.50    Types: Cigarettes    Start date: 01/01/1976  . Smokeless tobacco: Never Used  Substance and Sexual Activity  . Alcohol use: No    Alcohol/week: 0.0 standard drinks  . Drug use: No  . Sexual activity: Yes  Other Topics Concern  . Not on file  Social History Narrative   Health Care POA:    Emergency Contact: [name] [name] [phone] (c)   End of Life Plan:    Who lives with you: Lives with husband   Any pets: none   Diet: Patient lacks financial resources for much food. Pt reports eating what is available.   Exercise: Patient does not have an exercise plan.   Seatbelts: Patient reports  wearing seatbelt when in vehicle.    [name] Exposure/Protection:   Hobbies: Bowling, computer games, Bingo      Has financial difficulties and transportation issues as she and her husband share transportation       Social Determinants of Health   Financial Resource Strain:   . Difficulty of Paying Living Expenses: Not on file  Food Insecurity:   . Worried About Charity fundraiser in the Last Year: Not on file  . Ran Out of Food in the Last Year: Not on file  Transportation Needs:   . Lack of Transportation (Medical): Not on file  . Lack of Transportation (Non-Medical): Not on file  Physical Activity:   . Days of Exercise per Week: Not on file  . Minutes of Exercise per Session: Not on file  Stress:   . Feeling of Stress : Not on file  Social Connections:   . Frequency of Communication with Friends and Family: Not on file  . Frequency of Social Gatherings with Friends and Family: Not on file  . Attends Religious Services: Not on file  . Active Member of Clubs or Organizations: Not on file  . Attends Archivist Meetings: Not on file  . Marital Status: Not on file  Intimate Partner Violence:   . Fear of Current or Ex-Partner: Not on file  . Emotionally Abused: Not on file  . Physically Abused: Not on file  . Sexually Abused: Not on file    FAMILY HISTORY: Family History  Problem Relation Age of Onset  . Heart disease Mother   . Asthma Mother   . Cancer Mother        uterine   . Depression Mother   . Heart attack Mother 32  . Hyperlipidemia Mother   . Hypertension Mother   . Stroke Mother   . Kidney disease Mother   . Heart attack Sister   . Stroke Sister   . Depression Sister   . Diabetes Sister   . Hyperlipidemia Sister   . Depression Sister   . Diabetes Sister   . Hyperlipidemia Sister   . HIV/AIDS Sister   . Diabetes Brother   . Asthma Brother   . Hypertension Brother   . Cancer Maternal Aunt        lung  . Heart disease Maternal Grandmother   .  Heart attack Maternal Grandmother   . Cancer Brother        colon  . HIV/AIDS Brother   . Colon cancer Neg Hx     ALLERGIES:  is allergic to aspirin and baclofen.  MEDICATIONS:  Current Outpatient Medications  Medication Sig Dispense Refill  . albuterol (ACCUNEB) 0.63 MG/3ML nebulizer solution Take 3 mLs (0.63  mg total) by nebulization every 6 (six) hours as needed for wheezing. 75 mL 12  . albuterol (VENTOLIN HFA) 108 (90 Base) MCG/ACT inhaler INHALE 2 PUFFS BY MOUTH EVERY 6 HOURS AS NEEDED FOR WHEEZING OR SHORTNESS OF BREATH 18 g 11  . Blood Glucose Monitoring Suppl (ONETOUCH VERIO) w/Device KIT 1 Device by Does not apply route daily. 1 kit 0  . clopidogrel (PLAVIX) 75 MG tablet Take 75 mg by mouth daily.    . cyclobenzaprine (FLEXERIL) 5 MG tablet Take 1 tablet by mouth 3 times a day as needed for muscle spasms 30 tablet 11  . diclofenac sodium (VOLTAREN) 1 % GEL Apply 2 g topically 4 (four) times daily. 100 g 11  . DULoxetine (CYMBALTA) 30 MG capsule Take 1 capsule (30 mg total) by mouth daily. 30 capsule 11  . empagliflozin (JARDIANCE) 10 MG TABS tablet Take 10 mg by mouth daily. 90 tablet 3  . enalapril (VASOTEC) 20 MG tablet TAKE ONE TABLET BY MOUTH EVERY DAY 30 tablet 11  . famotidine (PEPCID) 20 MG tablet Take 2 tablets by mouth every day 60 tablet 11  . glucose blood (ONETOUCH VERIO) test strip Use to check blood sugar once a day 100 each 12  . hydrochlorothiazide (MICROZIDE) 12.5 MG capsule TAKE ONE CAPSULE BY MOUTH EVERY DAY 30 capsule 14  . Lancets (ONETOUCH ULTRASOFT) lancets Use to check blood sugar once a day 100 each 12  . Lido-Capsaicin-Men-Methyl Sal 0.5-0.035-5-20 % PTCH Apply 1 patch topically daily. As needed 30 patch 1  . metFORMIN (GLUCOPHAGE) 500 MG tablet Take 1 tablet by mouth every day with breakfast. 30 tablet 11  . metoprolol tartrate (LOPRESSOR) 25 MG tablet TAKE ONE-HALF TABLET BY MOUTH TWICE DAILY 30 tablet 11  . montelukast (SINGULAIR) 10 MG tablet TAKE  ONE TABLET BY MOUTH AT BEDTIME 30 tablet 11  . nitroGLYCERIN (NITROSTAT) 0.4 MG SL tablet Dissolve 1 tablet under tongue every 5 minutes, up to 3 doses for chest pain 25 tablet 11  . simvastatin (ZOCOR) 20 MG tablet TAKE ONE TABLET BY MOUTH AT BEDTIME 30 tablet 11  . traMADol (ULTRAM) 50 MG tablet Take 1 tablet by mouth every day as needed for moderate pain 30 tablet 2  . WIXELA INHUB 250-50 MCG/DOSE AEPB INHALE 1 PUFF BY MOUTH TWICE DAILY 60 each 11   No current facility-administered medications for this visit.    REVIEW OF SYSTEMS:   A 10+ POINT REVIEW OF SYSTEMS WAS OBTAINED including neurology, dermatology, psychiatry, cardiac, respiratory, lymph, extremities, GI, GU, Musculoskeletal, constitutional, breasts, reproductive, HEENT.  All pertinent positives are noted in the HPI.  All others are negative.     PHYSICAL EXAMINATION: ECOG FS:2 - Symptomatic, <50% confined to bed  Vitals:   01/04/20 0843  BP: (!) 141/68  Pulse: 60  Resp: 17  Temp: 98.2 F (36.8 C)  SpO2: 98%   Wt Readings from Last 3 Encounters:  01/04/20 133 lb 8 oz (60.6 kg)  09/28/19 126 lb 6.4 oz (57.3 kg)  09/19/19 124 lb 6 oz (56.4 kg)   Body mass index is 24.42 kg/m.    GENERAL:alert, in no acute distress and comfortable SKIN: no acute rashes, no significant lesions EYES: conjunctiva are pink and non-injected, sclera anicteric OROPHARYNX: MMM, no exudates, no oropharyngeal erythema or ulceration NECK: supple, no JVD LYMPH:  no palpable lymphadenopathy in the cervical, axillary or inguinal regions LUNGS: clear to auscultation b/l with normal respiratory effort HEART: regular rate & rhythm ABDOMEN:  normoactive  bowel sounds , non tender, not distended. Extremity: no pedal edema PSYCH: alert & oriented x 3 with fluent speech NEURO: no focal motor/sensory deficits   LABORATORY DATA:  I have reviewed the data as listed  . CBC Latest Ref Rng & Units 01/04/2020 09/28/2019 06/28/2019  WBC 4.0 - 10.5 K/uL  106.9(HH) 68.5(HH) 73.6(HH)  Hemoglobin 12.0 - 15.0 g/dL 12.3 12.0 11.4(L)  Hematocrit 36.0 - 46.0 % 40.5 38.6 37.3  Platelets 150 - 400 K/uL 257 225 209    . CMP Latest Ref Rng & Units 01/04/2020 09/28/2019 06/28/2019  Glucose 70 - 99 mg/dL 108(H) 96 114(H)  BUN 8 - 23 mg/dL '20 15 9  ' Creatinine 0.44 - 1.00 mg/dL 1.59(H) 1.25(H) 1.47(H)  Sodium 135 - 145 mmol/L 141 142 142  Potassium 3.5 - 5.1 mmol/L 4.0 3.6 3.3(L)  Chloride 98 - 111 mmol/L 101 103 103  CO2 22 - 32 mmol/L '28 29 28  ' Calcium 8.9 - 10.3 mg/dL 9.6 9.6 9.1  Total Protein 6.5 - 8.1 g/dL 6.9 6.8 6.7  Total Bilirubin 0.3 - 1.2 mg/dL 0.2(L) 0.5 0.4  Alkaline Phos 38 - 126 U/L 154(H) 142(H) 150(H)  AST 15 - 41 U/L 16 20 13(L)  ALT 0 - 44 U/L '15 17 8   ' . Lab Results  Component Value Date   LDH 247 (H) 01/04/2020        . Lab Results  Component Value Date   LDH 266 (H) 09/28/2019    RADIOGRAPHIC STUDIES: I have personally reviewed the radiological images as listed and agreed with the findings in the report. No results found. Surgical Pathology 12/13/2017   ASSESSMENT & PLAN:   TATEM HOLSONBACK is a wonderful 74 y.o. female with    1. Rai 1 Chronic Lymphocytic Leukemia  - Trisomy 12 mutation. No associated thrombocytopenia.  Mild anemia - likely from acute blood loss from significant epistaxis. No constitutional symptoms. Minimal LNaednopathy No splenomegaly  #2 .Lung nodules on CT in 12/2017. Rpt CT chest 08/09/2018 - stable  #3 Patient Active Problem List   Diagnosis Date Noted  . Pulmonary nodules 01/04/2018  . Mass of left lower leg 01/04/2018  . Chronic kidney disease (CKD), stage III (moderate) ([place]) 06/04/2016  . Chronic back pain 10/02/2015  . Chronic right hip pain 03/13/2012  . Episodic tension type headache 09/24/2010  . GAIT IMBALANCE 12/10/2009  . DM (diabetes mellitus), type 2 with peripheral vascular complications ([place]) 16/09/9603  . Chronic lymphocytic leukemia ([place]) 04/11/2009  .  PERIPHERAL VASCULAR DISEASE 10/13/2007  . HYPERCHOLESTEROLEMIA 02/23/2007  . ANXIETY 02/23/2007  . TOBACCO DEPENDENCE 02/23/2007  . HYPERTENSION, BENIGN SYSTEMIC 02/23/2007  . COPD (chronic obstructive pulmonary disease) ([place]) 02/23/2007  . GASTROESOPHAGEAL REFLUX, NO ESOPHAGITIS 02/23/2007     PLAN: -Discussed pt labwork today, 01/04/20; all values are WNL except for WBC at 106.9, Lymphs Abs at 88.2, Monocytes Absolute at 11.5, Abs Immature Granulocytes at 0.29, Glucose Bld at 108, Creatinine at 1.59, Alkaline Phosphatase at 154, Total Bilirubin at 0.2, GFR Est Non Af Am at 32, GFR Est AFR Am at 37, LDH at 247  -No anemia and no cytopenia  -Discussed options for COVID-19 vaccinations and recommended that she receive the vaccination once it is made available. -no evidence of symptomatic progression of CLL and on indication for treatment of CLL at this time. -Will continue to monitor WBC and other blood counts   FOLLOW UP: RTC with Dr Irene Limbo with labs in 10 weeks  The total time  spent in the appt was 30 minutes and more than 50% was on counseling and direct patient cares.  All of the patient's questions were answered with apparent satisfaction. The patient knows to call the clinic with any problems, questions or concerns.     Sullivan Lone MD [place] AAHIVMS [facility] Hematology/Oncology Physician [facility]  (Office):       [phone] (Work cell):  [phone] (Fax):           [phone]  I, Scot Dock, am acting as a scribe for Dr. Sullivan Lone.   .I have reviewed the above documentation for accuracy and completeness, and I agree with the above. Brunetta Genera MD

## 2020-01-04 NOTE — Progress Notes (Signed)
Lab/Reva called [id number]. WBC 106.9. Dr.Kale informed [id number]. Information acknowledged

## 2020-01-04 NOTE — Telephone Encounter (Signed)
Scheduled appt per 1/8 los.  Sent a message to HIM pool to get a calendar mailed out. 

## 2020-01-07 ENCOUNTER — Telehealth: Payer: Self-pay

## 2020-01-07 NOTE — Telephone Encounter (Signed)
Key: [id number]

## 2020-01-07 NOTE — Telephone Encounter (Signed)
Received fax from mail order pharmacy requesting a PA for Tramadol.  Clinical questions in providers box for review. Please return to RN box when comeplted.

## 2020-01-09 ENCOUNTER — Other Ambulatory Visit: Payer: Self-pay

## 2020-01-09 ENCOUNTER — Encounter: Payer: Self-pay | Admitting: Family Medicine

## 2020-01-09 ENCOUNTER — Ambulatory Visit (INDEPENDENT_AMBULATORY_CARE_PROVIDER_SITE_OTHER): Payer: Medicare Other | Admitting: Family Medicine

## 2020-01-09 VITALS — BP 130/72 | HR 74 | Wt 133.8 lb

## 2020-01-09 DIAGNOSIS — M549 Dorsalgia, unspecified: Secondary | ICD-10-CM

## 2020-01-09 DIAGNOSIS — E1151 Type 2 diabetes mellitus with diabetic peripheral angiopathy without gangrene: Secondary | ICD-10-CM

## 2020-01-09 DIAGNOSIS — G8929 Other chronic pain: Secondary | ICD-10-CM | POA: Diagnosis not present

## 2020-01-09 DIAGNOSIS — I10 Essential (primary) hypertension: Secondary | ICD-10-CM | POA: Diagnosis not present

## 2020-01-09 DIAGNOSIS — N1832 Chronic kidney disease, stage 3b: Secondary | ICD-10-CM

## 2020-01-09 LAB — POCT GLYCOSYLATED HEMOGLOBIN (HGB A1C): HbA1c, POC (controlled diabetic range): 7 % (ref 0.0–7.0)

## 2020-01-09 MED ORDER — GABAPENTIN 100 MG PO CAPS: 100.0000 mg | ORAL_CAPSULE | Freq: Every day | ORAL | 3 refills | Status: DC

## 2020-01-09 NOTE — Assessment & Plan Note (Addendum)
GFR of 37 on 01/04/20, A1c mains well controlled.  Metformin renally dosed.  BP well controlled.  Patient counseled on avoidance of NSAIDs.  Continue Jardiance 10 mg for renal protection. Referral has been made for nephrology to establish care with them but she has not called to schedule an appointment.

## 2020-01-09 NOTE — Progress Notes (Signed)
Subjective:  Patient ID: [name] [name]  DOB: [birth year] MRN: [id number]  [name] [name] is a 74 y.o. female with a PMH of T2DM, hypertension, COPD, GERD, CKD stage III, CLL, anxiety, PVD, tobacco dependence, here today for f/u HTN, diabetes.   HPI:  Diabetes: Taking medications:metformin500 mg daily and Jardiance 10 mg daily OnPlavix, and on statin Last eye exam:patient to schedule Last foot exam:performed today ROS: denies dizziness, diaphoresis, LOC, polyuria, polydipsia  Hypertension: - Medications:Hydrochlorothiazide 12.5, metoprolol 12.5 mg, enalapril 20 mg - Compliance:yes - Checking BP at home:No - Denies any SOB, CP, vision changes, LE edema, medication SEs, or symptoms of hypotension  CKD, stage III Patient reports that she has not had a phone call from the nephrologist office yet.  She avoids NSAIDs.  She denies any lower extremity swelling, confusion.  Chronic back pain Patient with history of chronic back pain has been on tramadol 50 mg for quite some time.  We had started Cymbalta to see if this would help with her pain.  Patient reports that her pain seems to be getting slightly worse over time.  She states she does not have any trauma.  She denies any weakness in her leg.  She denies any gait change.  She denies any numbness or tingling down either leg.  She denies any bowel or bladder incontinence.  Patient denies any new trauma.  She states that just over the past few months she has noticed that as she is getting up and doing things she has to sit down a little bit sooner than previously in order to help with her pain.  She thinks the tramadol might not be working as it once did.  She is requesting new medication.  She is also trialed Voltaren gel as well as Salonpas without success.  ROS: All other systems otherwise negative, except as mentioned in HPI  Family hx: Cardiovascular disease, Diabetes   Social hx: Denies use of illicit drugs, alcohol  use Smoking status reviewed  Patient Active Problem List   Diagnosis Date Noted  . Pulmonary nodules 01/04/2018  . Mass of left lower leg 01/04/2018  . Chronic kidney disease (CKD), stage III (moderate) ([place]) 06/04/2016  . Chronic back pain 10/02/2015  . DM (diabetes mellitus), type 2 with peripheral vascular complications ([place]) [id number]  . Chronic lymphocytic leukemia ([place]) 04/11/2009  . PERIPHERAL VASCULAR DISEASE 10/13/2007  . HYPERCHOLESTEROLEMIA 02/23/2007  . ANXIETY 02/23/2007  . TOBACCO DEPENDENCE 02/23/2007  . Essential hypertension 02/23/2007  . COPD (chronic obstructive pulmonary disease) ([place]) 02/23/2007  . GASTROESOPHAGEAL REFLUX, NO ESOPHAGITIS 02/23/2007     Objective:  BP 130/72   Pulse 74   Wt 133 lb 12.8 oz (60.7 kg)   SpO2 96%   BMI 24.47 kg/m   Vitals and nursing note reviewed  General: NAD, pleasant Cardiac: RRR, normal heart sounds, no m/r/g Pulm: normal effort, CTAB Extremities: no edema or cyanosis. WWP. Skin: warm and dry, no rashes noted Neuro: alert and oriented, no focal deficits Psych: normal affect, normal thought content  Assessment & Plan:   Chronic kidney disease (CKD), stage III (moderate) (H[facility]) GFR of 37 on 01/04/20, A1c mains well controlled.  Metformin renally dosed.  BP well controlled.  Patient counseled on avoidance of NSAIDs.  Continue Jardiance 10 mg for renal protection. Referral has been made for nephrology to establish care with them but she has not called to schedule an appointment.  Chronic back pain Patient states that she believes the tramadol 50 mg nightly  may not be helping her as much as it previously did.  She states that she is still able to do her activities but she must sit down much more frequently in order to help with her pain.  She denies any new trauma.  She states that the muscle relaxer has helped her greatly and she has been taking this more. -Trial gabapentin 100 mg nightly given that patient also has some  signs of neuropathy -Patient may trial off of tramadol for a few days to see if this is still helping with her pain. -Instructed that she could continue with her muscle relaxer, Cymbalta and Tylenol but she should avoid NSAIDs. -Strict return precautions discussed, patient voiced understanding.  DM (diabetes mellitus), type 2 with peripheral vascular complications (HCC) [id number] at goal at 7.0.  Patient reports that she has been less compliant with her diet over the holidays but plans to improve on that.  We will continue metformin 500 mg and Jardiance 10 mg.  Essential hypertension BP at goal at 130/72.  Patient reports that she is having trouble cutting the metoprolol pills in half and would like to trial off of this.  Appears from chart that patient was placed on metoprolol for hypertension as well as some atypical chest pain that was evaluated by cardiology and deemed to be atypical.  Patient may trial off of this and will monitor her blood pressure at home and will call with her numbers within the week. -Continue hydrochlorothiazide and enalapril  Patient reports that she has a new pharmacy and I will need to send in all of her medications to her new pharmacy.  [place] Duaa Stelzner, DO Family Medicine Resident PGY-3

## 2020-01-09 NOTE — Patient Instructions (Signed)
Thank you for coming to see me today. It was a pleasure! Today we talked about:   For your back pain I will start you on a medication called gabapentin.  We will start you on a low dose and you may try this at night as sometimes it causes people to be drowsy.  Hopefully this will help in conjunction with you doing the heating pads as well as taking your current medications.  If you would like to see if the tramadol is not working you may trial off of this for a few days and let me know if your pain is the same or if it worsens after stopping the tramadol.  Please continue taking your Metformin.  I reach out to the referral coordinator and [place] [facility] sometimes takes a while to get back to their patients.  There is no current rush on the appointment as we are managing everything appropriately at this time.  I do recommend you calling their office at [phone] to see if they are able to set you up with an appointment.  Please follow-up with me in 3 months or sooner as needed.  If you have any questions or concerns, please do not hesitate to call the office at [phone].  Take Care,   [place] Sangita Zani, DO

## 2020-01-11 MED ORDER — DULOXETINE HCL 30 MG PO CPEP: 30.0000 mg | ORAL_CAPSULE | Freq: Every day | ORAL | 11 refills | Status: DC

## 2020-01-11 MED ORDER — CYCLOBENZAPRINE HCL 5 MG PO TABS: ORAL_TABLET | ORAL | 11 refills | Status: DC

## 2020-01-11 MED ORDER — NITROGLYCERIN 0.4 MG SL SUBL: SUBLINGUAL_TABLET | SUBLINGUAL | 11 refills | Status: DC

## 2020-01-11 MED ORDER — ALBUTEROL SULFATE HFA 108 (90 BASE) MCG/ACT IN AERS: INHALATION_SPRAY | RESPIRATORY_TRACT | 11 refills | Status: DC

## 2020-01-11 MED ORDER — FLUTICASONE-SALMETEROL 250-50 MCG/DOSE IN AEPB: 1.0000 | INHALATION_SPRAY | Freq: Two times a day (BID) | RESPIRATORY_TRACT | 11 refills | Status: DC

## 2020-01-11 MED ORDER — ENALAPRIL MALEATE 20 MG PO TABS: 20.0000 mg | ORAL_TABLET | Freq: Every day | ORAL | 11 refills | Status: DC

## 2020-01-11 MED ORDER — MONTELUKAST SODIUM 10 MG PO TABS: 10.0000 mg | ORAL_TABLET | Freq: Every day | ORAL | 11 refills | Status: DC

## 2020-01-11 MED ORDER — METFORMIN HCL 500 MG PO TABS: ORAL_TABLET | ORAL | 11 refills | Status: DC

## 2020-01-11 MED ORDER — HYDROCHLOROTHIAZIDE 12.5 MG PO CAPS: 12.5000 mg | ORAL_CAPSULE | Freq: Every day | ORAL | 11 refills | Status: DC

## 2020-01-11 MED ORDER — SIMVASTATIN 20 MG PO TABS: 20.0000 mg | ORAL_TABLET | Freq: Every day | ORAL | 11 refills | Status: DC

## 2020-01-11 MED ORDER — EMPAGLIFLOZIN 10 MG PO TABS: 10.0000 mg | ORAL_TABLET | Freq: Every day | ORAL | 11 refills | Status: DC

## 2020-01-11 MED ORDER — CLOPIDOGREL BISULFATE 75 MG PO TABS: 75.0000 mg | ORAL_TABLET | Freq: Every day | ORAL | 11 refills | Status: DC

## 2020-01-11 MED ORDER — FAMOTIDINE 20 MG PO TABS: 40.0000 mg | ORAL_TABLET | Freq: Every day | ORAL | 11 refills | Status: DC

## 2020-01-11 NOTE — Assessment & Plan Note (Signed)
A1c at goal at 7.0.  Patient reports that she has been less compliant with her diet over the holidays but plans to improve on that.  We will continue metformin 500 mg and Jardiance 10 mg.

## 2020-01-11 NOTE — Telephone Encounter (Signed)
Reviewed.  Per covermymeds, med is a covered benefit.  LMOVM of pharmacy. Christen Bame, CMA

## 2020-01-11 NOTE — Assessment & Plan Note (Addendum)
Patient states that she believes the tramadol 50 mg nightly may not be helping her as much as it previously did.  She states that she is still able to do her activities but she must sit down much more frequently in order to help with her pain.  She denies any new trauma.  She states that the muscle relaxer has helped her greatly and she has been taking this more. -Trial gabapentin 100 mg nightly given that patient also has some signs of neuropathy -Patient may trial off of tramadol for a few days to see if this is still helping with her pain. -Instructed that she could continue with her muscle relaxer, Cymbalta and Tylenol but she should avoid NSAIDs. -Strict return precautions discussed, patient voiced understanding.

## 2020-01-11 NOTE — Assessment & Plan Note (Addendum)
BP at goal at 130/72.  Patient reports that she is having trouble cutting the metoprolol pills in half and would like to trial off of this.  Appears from chart that patient was placed on metoprolol for hypertension as well as some atypical chest pain that was evaluated by cardiology and deemed to be atypical.  Patient may trial off of this and will monitor her blood pressure at home and will call with her numbers within the week. -Continue hydrochlorothiazide and enalapril

## 2020-01-23 ENCOUNTER — Other Ambulatory Visit: Payer: Self-pay | Admitting: Family Medicine

## 2020-01-23 ENCOUNTER — Other Ambulatory Visit: Payer: Self-pay | Admitting: *Deleted

## 2020-01-24 ENCOUNTER — Other Ambulatory Visit: Payer: Self-pay | Admitting: Family Medicine

## 2020-01-24 DIAGNOSIS — Z1231 Encounter for screening mammogram for malignant neoplasm of breast: Secondary | ICD-10-CM

## 2020-01-25 ENCOUNTER — Other Ambulatory Visit: Payer: Self-pay | Admitting: Family Medicine

## 2020-01-28 ENCOUNTER — Ambulatory Visit: Payer: Medicare Other

## 2020-01-29 ENCOUNTER — Other Ambulatory Visit: Payer: Self-pay | Admitting: Family Medicine

## 2020-01-31 ENCOUNTER — Telehealth: Payer: Self-pay | Admitting: *Deleted

## 2020-01-31 NOTE — Telephone Encounter (Signed)
Pt calls nurse line because her cyclobenzaprine was denied by Medicare and she received a letter.    Advised that we have also received the letter and Dr. Enid Derry will review and get back with her for next steps..   Letter is in PCPs box. Christen Bame, CMA

## 2020-02-01 ENCOUNTER — Other Ambulatory Visit: Payer: Self-pay | Admitting: Family Medicine

## 2020-02-04 ENCOUNTER — Encounter: Payer: Self-pay | Admitting: Family Medicine

## 2020-02-08 ENCOUNTER — Other Ambulatory Visit: Payer: Self-pay

## 2020-02-08 ENCOUNTER — Ambulatory Visit
Admission: RE | Admit: 2020-02-08 | Discharge: 2020-02-08 | Disposition: A | Discharge status: Home / Self Care | Payer: Medicare Other | Source: Ambulatory Visit | Attending: *Deleted | Admitting: *Deleted

## 2020-02-08 DIAGNOSIS — Z1231 Encounter for screening mammogram for malignant neoplasm of breast: Secondary | ICD-10-CM

## 2020-02-08 NOTE — Telephone Encounter (Signed)
Called and informed patient that I am currently still int he appeals process of trying to get her this medication, and will update her when I hear more information.

## 2020-02-20 ENCOUNTER — Encounter: Payer: Medicare Other | Admitting: Family Medicine

## 2020-03-03 ENCOUNTER — Encounter: Payer: Self-pay | Admitting: Family Medicine

## 2020-03-03 ENCOUNTER — Ambulatory Visit (INDEPENDENT_AMBULATORY_CARE_PROVIDER_SITE_OTHER): Payer: Medicare Other | Admitting: Family Medicine

## 2020-03-03 ENCOUNTER — Other Ambulatory Visit: Payer: Self-pay

## 2020-03-03 VITALS — BP 130/58 | HR 74 | Wt 138.4 lb

## 2020-03-03 DIAGNOSIS — E1151 Type 2 diabetes mellitus with diabetic peripheral angiopathy without gangrene: Secondary | ICD-10-CM | POA: Diagnosis not present

## 2020-03-03 DIAGNOSIS — M62838 Other muscle spasm: Secondary | ICD-10-CM

## 2020-03-03 DIAGNOSIS — I10 Essential (primary) hypertension: Secondary | ICD-10-CM | POA: Diagnosis not present

## 2020-03-03 DIAGNOSIS — F17200 Nicotine dependence, unspecified, uncomplicated: Secondary | ICD-10-CM

## 2020-03-03 MED ORDER — FLUTICASONE FUROATE-VILANTEROL 100-25 MCG/INH IN AEPB: 1.0000 | INHALATION_SPRAY | Freq: Every day | RESPIRATORY_TRACT | 3 refills | Status: DC

## 2020-03-03 MED ORDER — CYCLOBENZAPRINE HCL 10 MG PO TABS: 10.0000 mg | ORAL_TABLET | Freq: Three times a day (TID) | ORAL | 2 refills | Status: DC | PRN

## 2020-03-03 NOTE — Patient Instructions (Addendum)
Thank you for coming to see me today. It was a pleasure! Today we talked about:   We will increase your gabapentin to 300 mg nightly to see if this helps with your pain.  After our discussion the cyclobenzaprine does carry a risk of falls or confusion but if it has more benefit and does not cause these issues for you then it may be okay for you to use the 10 mg.  I will send this into [employer], please let me know if you have any issues filling the medication.  I have also started you on a medication called Breo.  Please inhale this once daily in order to help prevent you from needing the albuterol as much.  You need to swish and swallow with water after using this inhaler.  Please reach out to [place] if you need any further assistance with smoking cessation.  Please follow-up with me in 3 months or sooner as needed.  If you have any questions or concerns, please do not hesitate to call the office at [phone].  Take Care,   [place] Darene Nappi, DO

## 2020-03-03 NOTE — Progress Notes (Signed)
   CHIEF COMPLAINT / HPI:  Cramps and Muscle Spasms: Patient has not been able to get her flexeril from pharmacy and she states the 5mg  does not help. She has previously been on 10mg  which helps her get sleep through the night, and she has been using a friend's which has worked for her. She reports it does not make her drowsy or dizzy.   Hypertension: - Medications:Hydrochlorothiazide 12.5, metoprolol 12.5 mg, enalapril 20 mg - Compliance:yes - Checking BP at home:No - Denies any SOB, CP, vision changes, LE edema, medication SEs, or symptoms of hypotension  Tobacco Use Disorder: Patient reports that's he is still interested in quitting but her husband is not helping her and she smokes when she gets upset with him  PERTINENT  PMH / PSH: T2DM, hypertension, COPD, GERD, CKD stage III, CLL, anxiety, PVD, tobacco dependence  OBJECTIVE: BP (!) 130/58   Pulse 74   Wt 138 lb 6 oz (62.8 kg)   SpO2 98%   BMI 25.31 kg/m   General: NAD, pleasant Neck: Supple, no LAD Resp: normal work of breathing Neuro: CN II-XII grossly intact Psych: AOx3, appropriate affect  ASSESSMENT / PLAN:  Essential hypertension BP 130/58 at goal. Continue current therapy. Lipid panel with LDL 41, at goal  TOBACCO DEPENDENCE Patient still smoking but interested in quitting, would not like pharmacy's help at this time and will continue to try on her own  Muscle spasm, nocturnal Patient with chronic pain that improves with flexeril 10mg . Given patient's age, drug suggested to be avoided, but after discussing possible risks with patient, she would like to take the risks in order to sleep better at night. Patient voiced understanding and will return if she has any issues. Continue flexeril 10mg  for now.     [place] Nilesh Stegall, DO PGY-3, [facility]

## 2020-03-04 LAB — LIPID PANEL
Chol/HDL Ratio: 4.2 ratio (ref 0.0–4.4)
Cholesterol, Total: 163 mg/dL (ref 100–199)
HDL: 39 mg/dL — ABNORMAL LOW (ref >39)
LDL Chol Calc (NIH): 83 mg/dL (ref 0–99)
Triglycerides: 250 mg/dL — ABNORMAL HIGH (ref 0–149)
VLDL Cholesterol Cal: 41 mg/dL — ABNORMAL HIGH (ref 5–40)

## 2020-03-07 DIAGNOSIS — M62838 Other muscle spasm: Secondary | ICD-10-CM | POA: Insufficient documentation

## 2020-03-07 NOTE — Assessment & Plan Note (Signed)
Patient with chronic pain that improves with flexeril 10mg . Given patient's age, drug suggested to be avoided, but after discussing possible risks with patient, she would like to take the risks in order to sleep better at night. Patient voiced understanding and will return if she has any issues. Continue flexeril 10mg  for now.

## 2020-03-07 NOTE — Assessment & Plan Note (Signed)
BP 130/58 at goal. Continue current therapy. Lipid panel with LDL 41, at goal

## 2020-03-07 NOTE — Assessment & Plan Note (Signed)
Patient still smoking but interested in quitting, would not like pharmacy's help at this time and will continue to try on her own

## 2020-03-10 ENCOUNTER — Other Ambulatory Visit: Payer: Self-pay | Admitting: Family Medicine

## 2020-03-14 ENCOUNTER — Inpatient Hospital Stay: Payer: Medicare Other | Attending: Hematology | Admitting: Hematology

## 2020-03-14 ENCOUNTER — Other Ambulatory Visit: Payer: Self-pay

## 2020-03-14 ENCOUNTER — Inpatient Hospital Stay: Payer: Medicare Other

## 2020-03-14 VITALS — BP 144/67 | HR 71 | Temp 98.7°F | Resp 18 | Ht 62.0 in | Wt 137.4 lb

## 2020-03-14 DIAGNOSIS — R911 Solitary pulmonary nodule: Secondary | ICD-10-CM | POA: Diagnosis not present

## 2020-03-14 DIAGNOSIS — R1012 Left upper quadrant pain: Secondary | ICD-10-CM | POA: Insufficient documentation

## 2020-03-14 DIAGNOSIS — J449 Chronic obstructive pulmonary disease, unspecified: Secondary | ICD-10-CM | POA: Insufficient documentation

## 2020-03-14 DIAGNOSIS — C9110 Chronic lymphocytic leukemia of B-cell type not having achieved remission: Secondary | ICD-10-CM | POA: Diagnosis not present

## 2020-03-14 DIAGNOSIS — Z9181 History of falling: Secondary | ICD-10-CM | POA: Insufficient documentation

## 2020-03-14 DIAGNOSIS — F17210 Nicotine dependence, cigarettes, uncomplicated: Secondary | ICD-10-CM | POA: Diagnosis not present

## 2020-03-14 DIAGNOSIS — R0602 Shortness of breath: Secondary | ICD-10-CM | POA: Diagnosis not present

## 2020-03-14 LAB — CMP (CANCER CENTER ONLY)
ALT: 9 U/L (ref 0–44)
AST: 16 U/L (ref 15–41)
Albumin: 3.9 g/dL (ref 3.5–5.0)
Alkaline Phosphatase: 135 U/L — ABNORMAL HIGH (ref 38–126)
Anion gap: 10 (ref 5–15)
BUN: 16 mg/dL (ref 8–23)
CO2: 28 mmol/L (ref 22–32)
Calcium: 8.9 mg/dL (ref 8.9–10.3)
Chloride: 105 mmol/L (ref 98–111)
Creatinine: 1.64 mg/dL — ABNORMAL HIGH (ref 0.44–1.00)
GFR, Est AFR Am: 36 mL/min — ABNORMAL LOW (ref >60)
GFR, Estimated: 31 mL/min — ABNORMAL LOW (ref >60)
Glucose, Bld: 135 mg/dL — ABNORMAL HIGH (ref 70–99)
Potassium: 4.3 mmol/L (ref 3.5–5.1)
Sodium: 143 mmol/L (ref 135–145)
Total Bilirubin: 0.3 mg/dL (ref 0.3–1.2)
Total Protein: 6.5 g/dL (ref 6.5–8.1)

## 2020-03-14 LAB — CBC WITH DIFFERENTIAL/PLATELET
Abs Immature Granulocytes: 0 10*3/uL (ref 0.00–0.07)
Basophils Absolute: 0 10*3/uL (ref 0.0–0.1)
Basophils Relative: 0 %
Eosinophils Absolute: 0 10*3/uL (ref 0.0–0.5)
Eosinophils Relative: 0 %
HCT: 40.2 % (ref 36.0–46.0)
Hemoglobin: 11.9 g/dL — ABNORMAL LOW (ref 12.0–15.0)
Lymphocytes Relative: 87 %
Lymphs Abs: 82.7 10*3/uL — ABNORMAL HIGH (ref 0.7–4.0)
MCH: 27.6 pg (ref 26.0–34.0)
MCHC: 29.6 g/dL — ABNORMAL LOW (ref 30.0–36.0)
MCV: 93.3 fL (ref 80.0–100.0)
Monocytes Absolute: 3.8 10*3/uL — ABNORMAL HIGH (ref 0.1–1.0)
Monocytes Relative: 4 %
Neutro Abs: 8.6 10*3/uL — ABNORMAL HIGH (ref 1.7–7.7)
Neutrophils Relative %: 9 %
Platelets: 222 10*3/uL (ref 150–400)
RBC: 4.31 MIL/uL (ref 3.87–5.11)
RDW: 17.8 % — ABNORMAL HIGH (ref 11.5–15.5)
WBC: 95.1 10*3/uL (ref 4.0–10.5)
nRBC: 0 % (ref 0.0–0.2)

## 2020-03-14 LAB — LACTATE DEHYDROGENASE: LDH: 255 U/L — ABNORMAL HIGH (ref 98–192)

## 2020-03-14 NOTE — Progress Notes (Signed)
HEMATOLOGY/ONCOLOGY CLINIC NOTE  Date of Service: 03/14/20    Patient Care Team: Shirley, [place], DO as PCP - General (Family Medicine)  CHIEF COMPLAINTS/PURPOSE OF CONSULTATION:  F/u for CLL   HISTORY OF PRESENTING ILLNESS:  [name] [name] is a wonderful 74 y.o. female smoker with history of CLL who has been referred to [place] by Dr Enid Derry, [place], DO at [facility] for evaluation and management of elevated WBC/lymphocytes.   She is accompanied by her husband. She reported to ED on 11/27/2017 with cough and abdominal pain with COPD exacerbation. Her labs on this day showed WBC elevated at 62.5. She has not previously seen a hematologist. She reports a plan to quit smoking on the first day of the new year. She reports worsening decrease in balance and states she has a cane and walker that she uses as needed. She was taking Prednisone.  No weight loss/fevers/chills/night sweats/ palpable lumps or bumps.  On review of systems, pt reports back pain, change in balance, abdominal pain, weight loss and denies changes in BM, fever and any other accompanying symptoms.   INTERVAL HISTORY:  [name] [name] is here for management and evaluation of f her Chronic Leukocytic Leukemia. The patient's last visit with [place] was on 01/04/20. The pt reports that she is doing well overall.  The pt reports she is good. She did have a fall where her left knee gave out. Her right side abdominal pain started after her fall. Pt uses a 4 wheel walker. She went to nephrologist where she was taken off some medications. Pt has SOB while walking sometimes, especially before going up steps. She has been getting muscle spasms as well and is on medication for them.   Of note since the patient's last visit, pt has had 3D Screen Breast Bilateral ([id number]) completed on 02/08/20 with results revealing No mammographic evidence of malignancy.   Lab results today (03/14/20) of CBC w/diff and CMP is as follows: all  values are WNL except for WBC at 95.1K, Hemoglobin at 11.9, MCHC at 29.6, RDW at 17.8,Neutro Abs at 8.6K, Lymphs Abs at 82.7K, Monocytes Abs at 3.8K, Glucose at 135, Creatinine at 1.64, Alkaline Phosphatase at 135, GFR, Est Non Af Am at 31, GFR, Est AFR Am at 36 03/14/20 of LDH at 255.  On review of systems, pt reports fatigue, staying hydrated, abdominal pain on left side and denies irregular bowl movements, fever, chills, night sweats, unexpected weight loss, new lumps/bumps, SOB and any other symptoms.     MEDICAL HISTORY:  Past Medical History:  Diagnosis Date  . Allergy    environmental  . Asthma   . CLL (chronic lymphoblastic leukemia)   . Constipation 11/15/2011  . COPD (chronic obstructive pulmonary disease) ([place])   . Diabetes mellitus without complication ([place])   . GERD (gastroesophageal reflux disease)   . Hyperlipidemia   . Hypertension   . PVD (peripheral vascular disease) ([place])   . Stroke [facility]) 1998   in 1998 due to tumor-right side    SURGICAL HISTORY: Past Surgical History:  Procedure Laterality Date  . ABI  02/2012   ABI <0.65 BL 02/2012  . BRAIN MENINGIOMA EXCISION    . Bypass grafting of RLE for PAD claudication     . CESAREAN SECTION     1974, 78 ,80  . CHOLECYSTECTOMY, LAPAROSCOPIC    . COLONOSCOPY  2014   [place], [place]  . ESOPHAGEAL DILATION      SOCIAL HISTORY: Social History  Socioeconomic History  . Marital status: Married    Spouse name: Not on file  . Number of children: 3  . Years of education: Not on file  . Highest education level: Not on file  Occupational History  . Occupation: DISABLED    Employer: DISABLED  Tobacco Use  . Smoking status: Current Some Day Smoker    Packs/day: 0.25    Years: 30.00    Pack years: 7.50    Types: Cigarettes    Start date: 01/01/1976  . Smokeless tobacco: Never Used  Substance and Sexual Activity  . Alcohol use: No    Alcohol/week: 0.0 standard drinks  . Drug use: No  . Sexual activity: Yes   Other Topics Concern  . Not on file  Social History Narrative   Health Care POA:    Emergency Contact: Almas Rake [phone] (c)   End of Life Plan:    Who lives with you: Lives with husband   Any pets: none   Diet: Patient lacks financial resources for much food. Pt reports eating what is available.   Exercise: Patient does not have an exercise plan.   Seatbelts: Patient reports wearing seatbelt when in vehicle.    [name] Exposure/Protection:   Hobbies: Bowling, computer games, Bingo      Has financial difficulties and transportation issues as she and her husband share transportation       Social Determinants of Health   Financial Resource Strain:   . Difficulty of Paying Living Expenses:   Food Insecurity:   . Worried About Charity fundraiser in the Last Year:   . Arboriculturist in the Last Year:   Transportation Needs:   . Film/video editor (Medical):   Marland Kitchen Lack of Transportation (Non-Medical):   Physical Activity:   . Days of Exercise per Week:   . Minutes of Exercise per Session:   Stress:   . Feeling of Stress :   Social Connections:   . Frequency of Communication with Friends and Family:   . Frequency of Social Gatherings with Friends and Family:   . Attends Religious Services:   . Active Member of Clubs or Organizations:   . Attends Archivist Meetings:   Marland Kitchen Marital Status:   Intimate Partner Violence:   . Fear of Current or Ex-Partner:   . Emotionally Abused:   Marland Kitchen Physically Abused:   . Sexually Abused:     FAMILY HISTORY: Family History  Problem Relation Age of Onset  . Heart disease Mother   . Asthma Mother   . Cancer Mother        uterine   . Depression Mother   . Heart attack Mother 76  . Hyperlipidemia Mother   . Hypertension Mother   . Stroke Mother   . Kidney disease Mother   . Heart attack Sister   . Stroke Sister   . Depression Sister   . Diabetes Sister   . Hyperlipidemia Sister   . Depression Sister   . Diabetes Sister    . Hyperlipidemia Sister   . HIV/AIDS Sister   . Diabetes Brother   . Asthma Brother   . Hypertension Brother   . Cancer Maternal Aunt        lung  . Heart disease Maternal Grandmother   . Heart attack Maternal Grandmother   . Cancer Brother        colon  . HIV/AIDS Brother   . Colon cancer Neg Hx  ALLERGIES:  is allergic to aspirin and baclofen.  MEDICATIONS:  Current Outpatient Medications  Medication Sig Dispense Refill  . clopidogrel (PLAVIX) 75 MG tablet Take 1 tablet by mouth every day 30 tablet 11  . hydrochlorothiazide (MICROZIDE) 12.5 MG capsule TAKE ONE CAPSULE BY MOUTH EVERY DAY 30 capsule 11  . albuterol (ACCUNEB) 0.63 MG/3ML nebulizer solution Take 3 mLs (0.63 mg total) by nebulization every 6 (six) hours as needed for wheezing. 75 mL 12  . albuterol (VENTOLIN HFA) 108 (90 Base) MCG/ACT inhaler INHALE 2 PUFFS BY MOUTH EVERY 6 HOURS AS NEEDED FOR WHEEZING OR SHORTNESS OF BREATH 18 g 11  . Blood Glucose Monitoring Suppl (ONETOUCH VERIO) w/Device KIT 1 Device by Does not apply route daily. 1 kit 0  . cyclobenzaprine (FLEXERIL) 10 MG tablet Take 1 tablet (10 mg total) by mouth 3 (three) times daily as needed for muscle spasms. 30 tablet 2  . diclofenac sodium (VOLTAREN) 1 % GEL Apply 2 g topically 4 (four) times daily. 100 g 11  . DULoxetine (CYMBALTA) 30 MG capsule Take 1 capsule (30 mg total) by mouth daily. 30 capsule 11  . empagliflozin (JARDIANCE) 10 MG TABS tablet Take 10 mg by mouth daily. 30 tablet 11  . enalapril (VASOTEC) 20 MG tablet Take 1 tablet (20 mg total) by mouth daily. 30 tablet 11  . famotidine (PEPCID) 20 MG tablet Take 2 tablets (40 mg total) by mouth daily. 60 tablet 11  . fluticasone furoate-vilanterol (BREO ELLIPTA) 100-25 MCG/INH AEPB Inhale 1 puff into the lungs daily. 28 each 3  . Fluticasone-Salmeterol (WIXELA INHUB) 250-50 MCG/DOSE AEPB Inhale 1 puff into the lungs 2 (two) times daily. 60 each 11  . gabapentin (NEURONTIN) 100 MG capsule Take  1 capsule (100 mg total) by mouth at bedtime. 90 capsule 3  . glucose blood (ONETOUCH VERIO) test strip Use to check blood sugar once a day 100 each 12  . Lancets (ONETOUCH ULTRASOFT) lancets Use to check blood sugar once a day 100 each 12  . Lido-Capsaicin-Men-Methyl Sal 0.5-0.035-5-20 % PTCH Apply 1 patch topically daily. As needed 30 patch 1  . metFORMIN (GLUCOPHAGE) 500 MG tablet Take 1 tablet by mouth every day with breakfast. 30 tablet 11  . montelukast (SINGULAIR) 10 MG tablet Take 1 tablet (10 mg total) by mouth at bedtime. 30 tablet 11  . nitroGLYCERIN (NITROSTAT) 0.4 MG SL tablet Dissolve 1 tablet under tongue every 5 minutes, up to 3 doses for chest pain 25 tablet 11  . simvastatin (ZOCOR) 20 MG tablet Take 1 tablet (20 mg total) by mouth at bedtime. 30 tablet 11  . traMADol (ULTRAM) 50 MG tablet Take 1 tablet by mouth every day as needed for moderate pain 30 tablet 4   No current facility-administered medications for this visit.    REVIEW OF SYSTEMS:   A 10+ POINT REVIEW OF SYSTEMS WAS OBTAINED including neurology, dermatology, psychiatry, cardiac, respiratory, lymph, extremities, GI, GU, Musculoskeletal, constitutional, breasts, reproductive, HEENT.  All pertinent positives are noted in the HPI.  All others are negative.   PHYSICAL EXAMINATION: ECOG FS:2 - Symptomatic, <50% confined to bed  Vitals:   03/14/20 1155  BP: (!) 144/67  Pulse: 71  Resp: 18  Temp: 98.7 F (37.1 C)  SpO2: 99%   Wt Readings from Last 3 Encounters:  03/14/20 137 lb 6.4 oz (62.3 kg)  03/03/20 138 lb 6 oz (62.8 kg)  01/09/20 133 lb 12.8 oz (60.7 kg)   Body mass index is  25.13 kg/m.     GENERAL:alert, in no acute distress and comfortable SKIN: no acute rashes, no significant lesions EYES: conjunctiva are pink and non-injected, sclera anicteric OROPHARYNX: MMM, no exudates, no oropharyngeal erythema or ulceration NECK: supple, no JVD LYMPH:  no palpable lymphadenopathy in the cervical,  axillary or inguinal regions LUNGS: clear to auscultation b/l with normal respiratory effort HEART: regular rate & rhythm ABDOMEN:  normoactive bowel sounds, not distended, Right side tenderness  Extremity: no pedal edema PSYCH: alert & oriented x 3 with fluent speech NEURO: no focal motor/sensory deficits   LABORATORY DATA:  I have reviewed the data as listed  . CBC Latest Ref Rng & Units 03/14/2020 01/04/2020 09/28/2019  WBC 4.0 - 10.5 K/uL 95.1(HH) 106.9(HH) 68.5(HH)  Hemoglobin 12.0 - 15.0 g/dL 11.9(L) 12.3 12.0  Hematocrit 36.0 - 46.0 % 40.2 40.5 38.6  Platelets 150 - 400 K/uL 222 257 225    . CMP Latest Ref Rng & Units 03/14/2020 01/04/2020 09/28/2019  Glucose 70 - 99 mg/dL 135(H) 108(H) 96  BUN 8 - 23 mg/dL '16 20 15  ' Creatinine 0.44 - 1.00 mg/dL 1.64(H) 1.59(H) 1.25(H)  Sodium 135 - 145 mmol/L 143 141 142  Potassium 3.5 - 5.1 mmol/L 4.3 4.0 3.6  Chloride 98 - 111 mmol/L 105 101 103  CO2 22 - 32 mmol/L '28 28 29  ' Calcium 8.9 - 10.3 mg/dL 8.9 9.6 9.6  Total Protein 6.5 - 8.1 g/dL 6.5 6.9 6.8  Total Bilirubin 0.3 - 1.2 mg/dL 0.3 0.2(L) 0.5  Alkaline Phos 38 - 126 U/L 135(H) 154(H) 142(H)  AST 15 - 41 U/L '16 16 20  ' ALT 0 - 44 U/L '9 15 17   ' . Lab Results  Component Value Date   LDH 255 (H) 03/14/2020        . Lab Results  Component Value Date   LDH 255 (H) 03/14/2020    RADIOGRAPHIC STUDIES: I have personally reviewed the radiological images as listed and agreed with the findings in the report. No results found. Surgical Pathology 12/13/2017   ASSESSMENT & PLAN:   JELITZA MANNINEN is a wonderful 74 y.o. female with    1. Rai 1 Chronic Lymphocytic Leukemia  - Trisomy 12 mutation. No associated thrombocytopenia.  Mild anemia - likely from acute blood loss from significant epistaxis. No constitutional symptoms. Minimal LNaednopathy No splenomegaly  #2 .Lung nodules on CT in 12/2017. Rpt CT chest 08/09/2018 - stable  #3 Patient Active Problem List    Diagnosis Date Noted  . Muscle spasm, nocturnal 03/07/2020  . Pulmonary nodules 01/04/2018  . Mass of left lower leg 01/04/2018  . Chronic kidney disease (CKD), stage III (moderate) ([place]) 06/04/2016  . Chronic back pain 10/02/2015  . DM (diabetes mellitus), type 2 with peripheral vascular complications ([place]) 63/87/5643  . Chronic lymphocytic leukemia ([place]) 04/11/2009  . PERIPHERAL VASCULAR DISEASE 10/13/2007  . HYPERCHOLESTEROLEMIA 02/23/2007  . ANXIETY 02/23/2007  . TOBACCO DEPENDENCE 02/23/2007  . Essential hypertension 02/23/2007  . COPD (chronic obstructive pulmonary disease) ([place]) 02/23/2007  . GASTROESOPHAGEAL REFLUX, NO ESOPHAGITIS 02/23/2007   PLAN: -Discussed pt labwork today, 03/14/20; of CBC w/diff and CMP is as follows: all values are WNL except for WBC at 95.1K, Hemoglobin at 11.9, MCHC at 29.6, RDW at 17.8,Neutro Abs at 8.6K, Lymphs Abs at 82.7K, Monocytes Abs at 3.8K, Glucose at 135, Creatinine at 1.64, Alkaline Phosphatase at 135, GFR, Est Non Af Am at 31, GFR, Est AFR Am at 36 03/14/20 of LDH at 255. -  Advised on CKD- f/u with nephrologist  -No anemia and no cytopenia  -Discussed options for COVID-19 vaccinations and recommended that she receive the vaccination once it is made available. -no evidence of symptomatic progression of CLL and on indication for treatment of CLL at this time. -Will continue to monitor WBC and other blood counts  -Will get CT CAP in in 3-5 days to evaluate for CLL/SLL progression in light of LUQ abdominal pain   FOLLOW UP: CT abd/pelvis and CT chest in 3-5 days Phone with Dr Irene Limbo in about 10-14 days  The total time spent in the appt was 20 minutes and more than 50% was on counseling and direct patient cares.  All of the patient's questions were answered with apparent satisfaction. The patient knows to call the clinic with any problems, questions or concerns.    Sullivan Lone MD [place] AAHIVMS [facility] Hematology/Oncology Physician [facility]  (Office):       [phone] (Work cell):  [phone] (Fax):           [phone]  I, Dawayne Cirri am acting as a scribe for Dr. Sullivan Lone.   .I have reviewed the above documentation for accuracy and completeness, and I agree with the above. Brunetta Genera MD

## 2020-03-18 ENCOUNTER — Telehealth: Payer: Self-pay | Admitting: Hematology

## 2020-03-18 NOTE — Telephone Encounter (Signed)
Scheduled per 03/19 los, patient has been called and notified. Rescheduled 03/30 phone visit due to a scheduling conflict with the CT scan to 04/06.  Message to provider.

## 2020-03-25 ENCOUNTER — Other Ambulatory Visit: Payer: Self-pay

## 2020-03-25 ENCOUNTER — Encounter (HOSPITAL_COMMUNITY): Payer: Self-pay

## 2020-03-25 ENCOUNTER — Ambulatory Visit: Payer: Medicare Other | Admitting: Hematology

## 2020-03-25 ENCOUNTER — Ambulatory Visit (HOSPITAL_COMMUNITY)
Admission: RE | Admit: 2020-03-25 | Discharge: 2020-03-25 | Disposition: A | Discharge status: Home / Self Care | Payer: Medicare Other | Source: Ambulatory Visit | Attending: Hematology | Admitting: Hematology

## 2020-03-25 DIAGNOSIS — C9110 Chronic lymphocytic leukemia of B-cell type not having achieved remission: Secondary | ICD-10-CM

## 2020-03-25 DIAGNOSIS — R1012 Left upper quadrant pain: Secondary | ICD-10-CM | POA: Insufficient documentation

## 2020-04-01 ENCOUNTER — Inpatient Hospital Stay: Payer: Medicare Other | Attending: Hematology | Admitting: Hematology

## 2020-04-01 DIAGNOSIS — C9110 Chronic lymphocytic leukemia of B-cell type not having achieved remission: Secondary | ICD-10-CM | POA: Diagnosis not present

## 2020-04-01 NOTE — Progress Notes (Signed)
HEMATOLOGY/ONCOLOGY CLINIC NOTE  Date of Service: 04/01/20    Patient Care Team: Shirley, Martin[place], DO as PCP - General (Family Medicine)  CHIEF COMPLAINTS/PURPOSE OF CONSULTATION:  F/u for CLL   HISTORY OF PRESENTING ILLNESS:  [name] [name] is a wonderful 74 y.o. female smoker with history of CLL who has been referred to [place] by Dr Enid Derry, [place], DO at [facility] for evaluation and management of elevated WBC/lymphocytes.   She is accompanied by her husband. She reported to ED on 11/27/2017 with cough and abdominal pain with COPD exacerbation. Her labs on this day showed WBC elevated at 62.5. She has not previously seen a hematologist. She reports a plan to quit smoking on the first day of the new year. She reports worsening decrease in balance and states she has a cane and walker that she uses as needed. She was taking Prednisone.  No weight loss/fevers/chills/night sweats/ palpable lumps or bumps.  On review of systems, pt reports back pain, change in balance, abdominal pain, weight loss and denies changes in BM, fever and any other accompanying symptoms.   INTERVAL HISTORY: I connected with  [name] on 04/01/20 by telephone and verified that I am speaking with the correct person using two identifiers.   I discussed the limitations of evaluation and management by telemedicine. The patient expressed understanding and agreed to proceed.  Other persons participating in the visit and their role in the encounter:       -Yevette Edwards, Medical Scribe  Patient's location: Home Provider's location: [place] [facility] is here for management and evaluation of her Chronic Leukocytic Leukemia. The patient's last visit with [place] was on 03/14/2020. The pt reports that she is doing well overall.  The pt reports that she is having pain in her side that is traveling down her leg. Pt has received both doses of the COVID19 vaccine.   Of note since the  patient's last visit, pt has had CT C/A/P ([id number]) ([id number]) completed on 03/25/2020 with results revealing "1. No noncontrast evidence of acute traumatic injury within the chest, abdomen or pelvis. 2. No significant change in enlarged bilateral axillary and subpectoral lymph nodes or an enlarged right iliac lymph node. No new lymphadenopathy in the chest, abdomen, or pelvis. 3. Emphysema (ICD10-J43.9). 4. Innumerable tiny centrilobular pulmonary nodules, likely smoking-related respiratory bronchiolitis. Occasional additional stable, benign small pulmonary nodules. 5. Coronary artery disease.  Aortic Atherosclerosis (ICD10-I70.0)."  On review of systems, pt reports abdominal/leg pain and denies any other symptoms.   MEDICAL HISTORY:  Past Medical History:  Diagnosis Date  . Allergy    environmental  . Asthma   . CLL (chronic lymphoblastic leukemia)   . Constipation 11/15/2011  . COPD (chronic obstructive pulmonary disease) ([place])   . Diabetes mellitus without complication ([place])   . GERD (gastroesophageal reflux disease)   . Hyperlipidemia   . Hypertension   . PVD (peripheral vascular disease) ([place])   . Stroke [facility]) 1998   in 1998 due to tumor-right side    SURGICAL HISTORY: Past Surgical History:  Procedure Laterality Date  . ABI  02/2012   ABI <0.65 BL 02/2012  . BRAIN MENINGIOMA EXCISION    . Bypass grafting of RLE for PAD claudication     . CESAREAN SECTION     1974, 78 ,80  . CHOLECYSTECTOMY, LAPAROSCOPIC    . COLONOSCOPY  2014   [place], [place]  . ESOPHAGEAL DILATION  SOCIAL HISTORY: Social History   Socioeconomic History  . Marital status: Married    Spouse name: Not on file  . Number of children: 3  . Years of education: Not on file  . Highest education level: Not on file  Occupational History  . Occupation: DISABLED    Employer: DISABLED  Tobacco Use  . Smoking status: Current Some Day Smoker    Packs/day: 0.25    Years: 30.00    Pack years: 7.50     Types: Cigarettes    Start date: 01/01/1976  . Smokeless tobacco: Never Used  Substance and Sexual Activity  . Alcohol use: No    Alcohol/week: 0.0 standard drinks  . Drug use: No  . Sexual activity: Yes  Other Topics Concern  . Not on file  Social History Narrative   Health Care POA:    Emergency Contact: Alane Hanssen [phone] (c)   End of Life Plan:    Who lives with you: Lives with husband   Any pets: none   Diet: Patient lacks financial resources for much food. Pt reports eating what is available.   Exercise: Patient does not have an exercise plan.   Seatbelts: Patient reports wearing seatbelt when in vehicle.    [name] Exposure/Protection:   Hobbies: Bowling, computer games, Bingo      Has financial difficulties and transportation issues as she and her husband share transportation       Social Determinants of Health   Financial Resource Strain:   . Difficulty of Paying Living Expenses:   Food Insecurity:   . Worried About Charity fundraiser in the Last Year:   . Arboriculturist in the Last Year:   Transportation Needs:   . Film/video editor (Medical):   Marland Kitchen Lack of Transportation (Non-Medical):   Physical Activity:   . Days of Exercise per Week:   . Minutes of Exercise per Session:   Stress:   . Feeling of Stress :   Social Connections:   . Frequency of Communication with Friends and Family:   . Frequency of Social Gatherings with Friends and Family:   . Attends Religious Services:   . Active Member of Clubs or Organizations:   . Attends Archivist Meetings:   Marland Kitchen Marital Status:   Intimate Partner Violence:   . Fear of Current or Ex-Partner:   . Emotionally Abused:   Marland Kitchen Physically Abused:   . Sexually Abused:     FAMILY HISTORY: Family History  Problem Relation Age of Onset  . Heart disease Mother   . Asthma Mother   . Cancer Mother        uterine   . Depression Mother   . Heart attack Mother 22  . Hyperlipidemia Mother   . Hypertension  Mother   . Stroke Mother   . Kidney disease Mother   . Heart attack Sister   . Stroke Sister   . Depression Sister   . Diabetes Sister   . Hyperlipidemia Sister   . Depression Sister   . Diabetes Sister   . Hyperlipidemia Sister   . HIV/AIDS Sister   . Diabetes Brother   . Asthma Brother   . Hypertension Brother   . Cancer Maternal Aunt        lung  . Heart disease Maternal Grandmother   . Heart attack Maternal Grandmother   . Cancer Brother        colon  . HIV/AIDS Brother   . Colon  cancer Neg Hx     ALLERGIES:  is allergic to aspirin and baclofen.  MEDICATIONS:  Current Outpatient Medications  Medication Sig Dispense Refill  . clopidogrel (PLAVIX) 75 MG tablet Take 1 tablet by mouth every day 30 tablet 11  . hydrochlorothiazide (MICROZIDE) 12.5 MG capsule TAKE ONE CAPSULE BY MOUTH EVERY DAY 30 capsule 11  . albuterol (ACCUNEB) 0.63 MG/3ML nebulizer solution Take 3 mLs (0.63 mg total) by nebulization every 6 (six) hours as needed for wheezing. 75 mL 12  . albuterol (VENTOLIN HFA) 108 (90 Base) MCG/ACT inhaler INHALE 2 PUFFS BY MOUTH EVERY 6 HOURS AS NEEDED FOR WHEEZING OR SHORTNESS OF BREATH 18 g 11  . Blood Glucose Monitoring Suppl (ONETOUCH VERIO) w/Device KIT 1 Device by Does not apply route daily. 1 kit 0  . cyclobenzaprine (FLEXERIL) 10 MG tablet Take 1 tablet (10 mg total) by mouth 3 (three) times daily as needed for muscle spasms. 30 tablet 2  . diclofenac sodium (VOLTAREN) 1 % GEL Apply 2 g topically 4 (four) times daily. 100 g 11  . DULoxetine (CYMBALTA) 30 MG capsule Take 1 capsule (30 mg total) by mouth daily. 30 capsule 11  . empagliflozin (JARDIANCE) 10 MG TABS tablet Take 10 mg by mouth daily. 30 tablet 11  . enalapril (VASOTEC) 20 MG tablet Take 1 tablet (20 mg total) by mouth daily. (Patient not taking: Reported on 03/14/2020) 30 tablet 11  . famotidine (PEPCID) 20 MG tablet Take 2 tablets (40 mg total) by mouth daily. 60 tablet 11  . fluticasone  furoate-vilanterol (BREO ELLIPTA) 100-25 MCG/INH AEPB Inhale 1 puff into the lungs daily. 28 each 3  . Fluticasone-Salmeterol (WIXELA INHUB) 250-50 MCG/DOSE AEPB Inhale 1 puff into the lungs 2 (two) times daily. 60 each 11  . gabapentin (NEURONTIN) 100 MG capsule Take 1 capsule (100 mg total) by mouth at bedtime. 90 capsule 3  . glucose blood (ONETOUCH VERIO) test strip Use to check blood sugar once a day 100 each 12  . Lancets (ONETOUCH ULTRASOFT) lancets Use to check blood sugar once a day 100 each 12  . Lido-Capsaicin-Men-Methyl Sal 0.5-0.035-5-20 % PTCH Apply 1 patch topically daily. As needed 30 patch 1  . metFORMIN (GLUCOPHAGE) 500 MG tablet Take 1 tablet by mouth every day with breakfast. 30 tablet 11  . montelukast (SINGULAIR) 10 MG tablet Take 1 tablet (10 mg total) by mouth at bedtime. 30 tablet 11  . nitroGLYCERIN (NITROSTAT) 0.4 MG SL tablet Dissolve 1 tablet under tongue every 5 minutes, up to 3 doses for chest pain 25 tablet 11  . simvastatin (ZOCOR) 20 MG tablet Take 1 tablet (20 mg total) by mouth at bedtime. 30 tablet 11  . traMADol (ULTRAM) 50 MG tablet Take 1 tablet by mouth every day as needed for moderate pain 30 tablet 4   No current facility-administered medications for this visit.    REVIEW OF SYSTEMS:   A 10+ POINT REVIEW OF SYSTEMS WAS OBTAINED including neurology, dermatology, psychiatry, cardiac, respiratory, lymph, extremities, GI, GU, Musculoskeletal, constitutional, breasts, reproductive, HEENT.  All pertinent positives are noted in the HPI.  All others are negative.   PHYSICAL EXAMINATION: ECOG FS:2 - Symptomatic, <50% confined to bed  There were no vitals filed for this visit. Wt Readings from Last 3 Encounters:  03/14/20 137 lb 6.4 oz (62.3 kg)  03/03/20 138 lb 6 oz (62.8 kg)  01/09/20 133 lb 12.8 oz (60.7 kg)   There is no height or weight on file to  calculate BMI.     Telehealth visit  LABORATORY DATA:  I have reviewed the data as listed  . CBC  Latest Ref Rng & Units 03/14/2020 01/04/2020 09/28/2019  WBC 4.0 - 10.5 K/uL 95.1(HH) 106.9(HH) 68.5(HH)  Hemoglobin 12.0 - 15.0 g/dL 11.9(L) 12.3 12.0  Hematocrit 36.0 - 46.0 % 40.2 40.5 38.6  Platelets 150 - 400 K/uL 222 257 225    . CMP Latest Ref Rng & Units 03/14/2020 01/04/2020 09/28/2019  Glucose 70 - 99 mg/dL 135(H) 108(H) 96  BUN 8 - 23 mg/dL '16 20 15  ' Creatinine 0.44 - 1.00 mg/dL 1.64(H) 1.59(H) 1.25(H)  Sodium 135 - 145 mmol/L 143 141 142  Potassium 3.5 - 5.1 mmol/L 4.3 4.0 3.6  Chloride 98 - 111 mmol/L 105 101 103  CO2 22 - 32 mmol/L '28 28 29  ' Calcium 8.9 - 10.3 mg/dL 8.9 9.6 9.6  Total Protein 6.5 - 8.1 g/dL 6.5 6.9 6.8  Total Bilirubin 0.3 - 1.2 mg/dL 0.3 0.2(L) 0.5  Alkaline Phos 38 - 126 U/L 135(H) 154(H) 142(H)  AST 15 - 41 U/L '16 16 20  ' ALT 0 - 44 U/L '9 15 17   ' . Lab Results  Component Value Date   LDH 255 (H) 03/14/2020        . Lab Results  Component Value Date   LDH 255 (H) 03/14/2020    RADIOGRAPHIC STUDIES: I have personally reviewed the radiological images as listed and agreed with the findings in the report. CT Abdomen Pelvis Wo Contrast  Result Date: 03/25/2020 CLINICAL DATA:  CLL, left lower chest pain and left upper abdominal pain, fall, possible trauma, rule out CLL progression EXAM: CT CHEST, ABDOMEN AND PELVIS WITHOUT CONTRAST TECHNIQUE: Multidetector CT imaging of the chest, abdomen and pelvis was performed following the standard protocol without IV contrast. Oral enteric contrast was administered. COMPARISON:  CT chest abdomen pelvis, 12/30/2017, CT abdomen pelvis, 04/09/2017 FINDINGS: CT CHEST FINDINGS Cardiovascular: Aortic atherosclerosis. Scattered coronary artery calcifications. Normal heart size. No pericardial effusion. Mediastinum/Nodes: No significant change in enlarged bilateral axillary and subpectoral lymph nodes, largest left subpectoral node measuring 1.6 x 1.1 cm (series 2, image 23). Thyroid gland, trachea, and esophagus demonstrate  no significant findings. Lungs/Pleura: Mild centrilobular emphysema. Occasional small pulmonary nodules, stable compared to prior examination dated 2019 and benign, for example a 4 mm nodule of the right lung base (series 7, image 106). There is a background of innumerable tiny centrilobular pulmonary nodules. No pleural effusion or pneumothorax. Musculoskeletal: No chest wall mass or suspicious bone lesions identified. CT ABDOMEN PELVIS FINDINGS Hepatobiliary: No focal liver abnormality is seen. Status post cholecystectomy. No biliary dilatation. Pancreas: Unremarkable. No pancreatic ductal dilatation or surrounding inflammatory changes. Spleen: Normal in size without significant abnormality. Adrenals/Urinary Tract: Adrenal glands are unremarkable. Kidneys are normal, without renal calculi, solid lesion, or hydronephrosis. Bladder is unremarkable. Stomach/Bowel: Stomach is within normal limits. Appendix is not clearly visualized. No evidence of bowel wall thickening, distention, or inflammatory changes. Vascular/Lymphatic: Aortic atherosclerosis. Bilateral common iliac artery stents. Incompletely imaged left femoral artery bypass graft in the included upper thigh. No significant change in an enlarged right iliac lymph node measuring 1.2 x 1.2 cm (series 2, image 26). Reproductive: No mass or other abnormality. Other: No abdominal wall hernia or abnormality. No abdominopelvic ascites. Musculoskeletal: No acute or significant osseous findings. IMPRESSION: 1. No noncontrast evidence of acute traumatic injury within the chest, abdomen or pelvis. 2. No significant change in enlarged bilateral axillary and subpectoral lymph  nodes or an enlarged right iliac lymph node. No new lymphadenopathy in the chest, abdomen, or pelvis. 3. Emphysema (ICD10-J43.9). 4. Innumerable tiny centrilobular pulmonary nodules, likely smoking-related respiratory bronchiolitis. Occasional additional stable, benign small pulmonary nodules. 5.  Coronary artery disease.  Aortic Atherosclerosis (ICD10-I70.0) Electronically Signed   By: Eddie Candle M.D.   On: 03/25/2020 10:03   CT Chest Wo Contrast  Result Date: 03/25/2020 CLINICAL DATA:  CLL, left lower chest pain and left upper abdominal pain, fall, possible trauma, rule out CLL progression EXAM: CT CHEST, ABDOMEN AND PELVIS WITHOUT CONTRAST TECHNIQUE: Multidetector CT imaging of the chest, abdomen and pelvis was performed following the standard protocol without IV contrast. Oral enteric contrast was administered. COMPARISON:  CT chest abdomen pelvis, 12/30/2017, CT abdomen pelvis, 04/09/2017 FINDINGS: CT CHEST FINDINGS Cardiovascular: Aortic atherosclerosis. Scattered coronary artery calcifications. Normal heart size. No pericardial effusion. Mediastinum/Nodes: No significant change in enlarged bilateral axillary and subpectoral lymph nodes, largest left subpectoral node measuring 1.6 x 1.1 cm (series 2, image 23). Thyroid gland, trachea, and esophagus demonstrate no significant findings. Lungs/Pleura: Mild centrilobular emphysema. Occasional small pulmonary nodules, stable compared to prior examination dated 2019 and benign, for example a 4 mm nodule of the right lung base (series 7, image 106). There is a background of innumerable tiny centrilobular pulmonary nodules. No pleural effusion or pneumothorax. Musculoskeletal: No chest wall mass or suspicious bone lesions identified. CT ABDOMEN PELVIS FINDINGS Hepatobiliary: No focal liver abnormality is seen. Status post cholecystectomy. No biliary dilatation. Pancreas: Unremarkable. No pancreatic ductal dilatation or surrounding inflammatory changes. Spleen: Normal in size without significant abnormality. Adrenals/Urinary Tract: Adrenal glands are unremarkable. Kidneys are normal, without renal calculi, solid lesion, or hydronephrosis. Bladder is unremarkable. Stomach/Bowel: Stomach is within normal limits. Appendix is not clearly visualized. No evidence  of bowel wall thickening, distention, or inflammatory changes. Vascular/Lymphatic: Aortic atherosclerosis. Bilateral common iliac artery stents. Incompletely imaged left femoral artery bypass graft in the included upper thigh. No significant change in an enlarged right iliac lymph node measuring 1.2 x 1.2 cm (series 2, image 26). Reproductive: No mass or other abnormality. Other: No abdominal wall hernia or abnormality. No abdominopelvic ascites. Musculoskeletal: No acute or significant osseous findings. IMPRESSION: 1. No noncontrast evidence of acute traumatic injury within the chest, abdomen or pelvis. 2. No significant change in enlarged bilateral axillary and subpectoral lymph nodes or an enlarged right iliac lymph node. No new lymphadenopathy in the chest, abdomen, or pelvis. 3. Emphysema (ICD10-J43.9). 4. Innumerable tiny centrilobular pulmonary nodules, likely smoking-related respiratory bronchiolitis. Occasional additional stable, benign small pulmonary nodules. 5. Coronary artery disease.  Aortic Atherosclerosis (ICD10-I70.0) Electronically Signed   By: Eddie Candle M.D.   On: 03/25/2020 10:03   Surgical Pathology 12/13/2017   ASSESSMENT & PLAN:   YZABELLE CALLES is a wonderful 74 y.o. female with    1. Rai 1 Chronic Lymphocytic Leukemia  - Trisomy 12 mutation. No associated thrombocytopenia.  Mild anemia - likely from acute blood loss from significant epistaxis. No constitutional symptoms. Minimal LNaednopathy No splenomegaly  #2 .Lung nodules on CT in 12/2017. Rpt CT chest 08/09/2018 - stable  #3 Patient Active Problem List   Diagnosis Date Noted  . Muscle spasm, nocturnal 03/07/2020  . Pulmonary nodules 01/04/2018  . Mass of left lower leg 01/04/2018  . Chronic kidney disease (CKD), stage III (moderate) ([place]) 06/04/2016  . Chronic back pain 10/02/2015  . DM (diabetes mellitus), type 2 with peripheral vascular complications ([place]) 35/70/1779  . Chronic lymphocytic leukemia (  [place])  04/11/2009  . PERIPHERAL VASCULAR DISEASE 10/13/2007  . HYPERCHOLESTEROLEMIA 02/23/2007  . ANXIETY 02/23/2007  . TOBACCO DEPENDENCE 02/23/2007  . Essential hypertension 02/23/2007  . COPD (chronic obstructive pulmonary disease) ([place]) 02/23/2007  . GASTROESOPHAGEAL REFLUX, NO ESOPHAGITIS 02/23/2007   PLAN: -Discussed 03/25/2020 CT C/A/P ([id number]) ([id number]) which revealed "1. No noncontrast evidence of acute traumatic injury within the chest, abdomen or pelvis. 2. No significant change in enlarged bilateral axillary and subpectoral lymph nodes or an enlarged right iliac lymph node. No new lymphadenopathy in the chest, abdomen, or pelvis. 3. Emphysema (ICD10-J43.9). 4. Innumerable tiny centrilobular pulmonary nodules, likely smoking-related respiratory bronchiolitis. Occasional additional stable, benign small pulmonary nodules. 5. Coronary artery disease.  Aortic Atherosclerosis (ICD10-I70.0)." -No evidence of CLL progression based on scan and recent labs  -No indication to begin treatment of CLL at this time. -Will continue to monitor WBC and other blood counts  -Disease appears stable, will continue to monitor with labs/clinic visits every 4 months  -Recommend pt f/u with Dr. Martin[place] for abdominal/leg pain -Will see back in 4 months with labs. Sooner if any new concerns or symptoms.   FOLLOW UP: RTC with Dr Irene Limbo with labs in 4 months   The total time spent in the appt was 20 minutes and more than 50% was on counseling and direct patient cares.  All of the patient's questions were answered with apparent satisfaction. The patient knows to call the clinic with any problems, questions or concerns.   Sullivan Lone MD [place] AAHIVMS [facility] Hematology/Oncology Physician [facility]  (Office):       [phone] (Work cell):  [phone] (Fax):           [phone]  I, Yevette Edwards, am acting as a scribe for Dr. Sullivan Lone.   .I have reviewed the above documentation  for accuracy and completeness, and I agree with the above. Brunetta Genera MD

## 2020-04-03 ENCOUNTER — Telehealth: Payer: Self-pay | Admitting: Hematology

## 2020-04-03 NOTE — Telephone Encounter (Signed)
Scheduled per los, patient has been called and notified. 

## 2020-04-04 ENCOUNTER — Other Ambulatory Visit: Payer: Self-pay | Admitting: *Deleted

## 2020-04-04 MED ORDER — CYCLOBENZAPRINE HCL 10 MG PO TABS: 10.0000 mg | ORAL_TABLET | Freq: Three times a day (TID) | ORAL | 0 refills | Status: DC | PRN

## 2020-04-09 ENCOUNTER — Other Ambulatory Visit: Payer: Self-pay | Admitting: Family Medicine

## 2020-04-15 ENCOUNTER — Telehealth: Payer: Self-pay

## 2020-04-15 NOTE — Telephone Encounter (Signed)
Fax received from pharmacy requesting a PA on Flexeril 10mg . Clinical questions submitted via covermymeds. Will await response.   Key: [id number]

## 2020-04-16 ENCOUNTER — Ambulatory Visit: Payer: Medicare Other | Admitting: Family Medicine

## 2020-04-16 NOTE — Telephone Encounter (Signed)
Patient unable to tolerate baclofen which would be age-appropriate due to side effects causing nausea.  We have discussed risk and benefits of this medication multiple times.  Is there a way that I can fill out a prior authorization for her to be able to continue the Flexeril?

## 2020-04-16 NOTE — Telephone Encounter (Signed)
I did a PA on her, which was denied via covermymeds. I can try and call them tomorrow.

## 2020-04-16 NOTE — Telephone Encounter (Signed)
Flexeril was denied by patients insurance due to age. Please send in an alternative if applicable.

## 2020-04-24 ENCOUNTER — Other Ambulatory Visit: Payer: Self-pay

## 2020-04-24 ENCOUNTER — Encounter: Payer: Self-pay | Admitting: Family Medicine

## 2020-04-24 ENCOUNTER — Ambulatory Visit (INDEPENDENT_AMBULATORY_CARE_PROVIDER_SITE_OTHER): Payer: Medicare Other | Admitting: Family Medicine

## 2020-04-24 VITALS — BP 168/70 | HR 81 | Ht 62.0 in | Wt 142.0 lb

## 2020-04-24 DIAGNOSIS — M549 Dorsalgia, unspecified: Secondary | ICD-10-CM | POA: Diagnosis not present

## 2020-04-24 DIAGNOSIS — R2242 Localized swelling, mass and lump, left lower limb: Secondary | ICD-10-CM | POA: Diagnosis not present

## 2020-04-24 DIAGNOSIS — I10 Essential (primary) hypertension: Secondary | ICD-10-CM

## 2020-04-24 DIAGNOSIS — G8929 Other chronic pain: Secondary | ICD-10-CM

## 2020-04-24 DIAGNOSIS — M25552 Pain in left hip: Secondary | ICD-10-CM

## 2020-04-24 DIAGNOSIS — E1151 Type 2 diabetes mellitus with diabetic peripheral angiopathy without gangrene: Secondary | ICD-10-CM | POA: Diagnosis not present

## 2020-04-24 LAB — POCT GLYCOSYLATED HEMOGLOBIN (HGB A1C): HbA1c, POC (controlled diabetic range): 6.8 % (ref 0.0–7.0)

## 2020-04-24 NOTE — Patient Instructions (Signed)
Thank you for coming to see me today. It was a pleasure! Today we talked about:   Please have Xrays done of your hip. I have increased your duloxetine to 60mg  to help with your pain.   Please follow-up with me 4-6 weeks or sooner as needed.  If you have any questions or concerns, please do not hesitate to call the office at [phone].  Take Care,   [place] Amri Lien, DO

## 2020-04-24 NOTE — Progress Notes (Signed)
   SUBJECTIVE:   CHIEF COMPLAINT / HPI:   Left leg pain, chronic  leg swelling: 2-3 days ago started having leg swelling on the right.  She reports that she is continuing to have leg pain that has begun to get worse.  She has been out of her Flexeril.  Patient reports that the pain starts in her left hip and goes down her leg.  This is what she has previously reported.  She denies any weakness of the leg, numbness, tingling, bowel or bladder incontinence.  Patient denies any different low back pain.  Diabetes: Medications: jardiance, metformin 500mg   Compliance: yes Hypoglycemic symptoms: no On Aspirin, and on statin Last eye exam: recently Last foot exam: up to date ROS: denies dizziness, diaphoresis, LOC, polyuria, polydipsia  Hypertension: - Medications: recently changed by renal. Now on hydralazine tid. Stopped HCTZ, metoprolol, and enalipril - Compliance: yes - Checking BP at home: no - Denies any SOB, CP, vision changes, LE edema, medication SEs, or symptoms of hypotension  PERTINENT  PMH / PSH: T2DM, hypertension, COPD, GERD, CKD stage III, CLL, anxiety, PVD, tobacco dependence  OBJECTIVE:  BP (!) 168/70 Comment: no blood pressure medication this morning  Pulse 81   Ht 5\' 2"  (1.575 m)   Wt 142 lb (64.4 kg)   SpO2 95%   BMI 25.97 kg/m   General: NAD, pleasant Neck: Supple Cardiovascular: no LE edema Respiratory: normal work of breathing Psych: AOx3, appropriate affect Hip, L: ROM limited in all directions due to pan, worse with external rotation; Strength 4/5 in IR/ER/Flex/Ext/Abd/Add. Standing hip rotation and gait without unsteadiness. Greater trochanter without tenderness to palpation.   ASSESSMENT/PLAN:   DM (diabetes mellitus), type 2 with peripheral vascular complications (HCC) 123456 at goal of 6.8. Continue metformin 500mg  once daily and jardiance 10mg  - for renal protection. Last GFR 36. Will need to discontinue metformin if GFR <30 and consider d/c of  jardiance if GFR <25.   Essential hypertension Has not taken morning BP pills and HTN today at 168/70. Nephrology recently d/c'd enalipril and HCTZ, and patient dc'd metoprolol. Started on hydralazine tid. Continue current treatment and follow up with renal as scheduled.   Chronic back pain Patient's pain has worsened and her left hip.  Previously has had x-rays done of her lower back.  Patient with last hip imaging done in 2013 and with more pain on internal and external rotation of hip.  Would like to rule out OA of hip.  Patient does have significant PVD with stents placed bilaterally.  She denies any new trauma.  She would like to restart her muscle relaxer as it helps with her pain.  Does not think the tramadol is helping as much.  She reports she does not believe that the gabapentin has helped as much as she would like.  She reports she believes that the Cymbalta may have helped some. -Increase Cymbalta to 60 mg -X-rays of hip with no signs of OA -On further review of chart see that patient previously has had MRI with neurofibroma located in left leg.  Given increase in pain, will follow up to see if patient may need further imaging and possible removal given that it was also near a nerve. -Strict return precautions discussed, patient voiced understanding.    [place] Nehan Flaum, DO PGY-3, [facility]

## 2020-04-25 ENCOUNTER — Ambulatory Visit
Admission: RE | Admit: 2020-04-25 | Discharge: 2020-04-25 | Disposition: A | Discharge status: Home / Self Care | Payer: Medicare Other | Source: Ambulatory Visit | Attending: Family Medicine | Admitting: Family Medicine

## 2020-04-25 DIAGNOSIS — M25552 Pain in left hip: Secondary | ICD-10-CM

## 2020-04-25 NOTE — Telephone Encounter (Signed)
Attempted another PA for Flexeril, request denied again. Next step would be for provider to call below for a peer to peer appeal.

## 2020-04-25 NOTE — Telephone Encounter (Signed)
Will discuss with patient whether or not it improved lifestyle and attempt a peer to peer if she would like to continue. Likely will be next week.

## 2020-04-26 ENCOUNTER — Other Ambulatory Visit: Payer: Self-pay | Admitting: Family Medicine

## 2020-04-27 ENCOUNTER — Other Ambulatory Visit: Payer: Self-pay | Admitting: Family Medicine

## 2020-04-28 NOTE — Assessment & Plan Note (Signed)
Has not taken morning BP pills and HTN today at 168/70. Nephrology recently d/c'd enalipril and HCTZ, and patient dc'd metoprolol. Started on hydralazine tid. Continue current treatment and follow up with renal as scheduled.

## 2020-04-28 NOTE — Assessment & Plan Note (Signed)
A1c at goal of 6.8. Continue metformin 500mg  once daily and jardiance 10mg  - for renal protection. Last GFR 36. Will need to discontinue metformin if GFR <30 and consider d/c of jardiance if GFR <25.

## 2020-04-29 ENCOUNTER — Telehealth: Payer: Self-pay

## 2020-04-29 MED ORDER — CYCLOBENZAPRINE HCL 10 MG PO TABS: ORAL_TABLET | ORAL | 11 refills | Status: DC

## 2020-04-29 NOTE — Telephone Encounter (Signed)
Patient calls nurse line requesting recent Xray results and plan moving forward. Patient stated she is still in a great deal of pain and is not sleeping. Please advise.

## 2020-04-29 NOTE — Assessment & Plan Note (Addendum)
Patient's pain has worsened and her left hip.  Previously has had x-rays done of her lower back.  Patient with last hip imaging done in 2013 and with more pain on internal and external rotation of hip.  Would like to rule out OA of hip.  Patient does have significant PVD with stents placed bilaterally.  She denies any new trauma.  She would like to restart her muscle relaxer as it helps with her pain.  Does not think the tramadol is helping as much.  She reports she does not believe that the gabapentin has helped as much as she would like.  She reports she believes that the Cymbalta may have helped some. -Increase Cymbalta to 60 mg -X-rays of hip with no signs of OA -On further review of chart see that patient previously has had MRI with neurofibroma located in left leg.  Given increase in pain, will follow up to see if patient may need further imaging and possible removal given that it was also near a nerve. -Strict return precautions discussed, patient voiced understanding.

## 2020-04-29 NOTE — Telephone Encounter (Signed)
Called and was discussed plan with Ms. Jasper.  Her pain was previously better controlled when she was taking Flexeril.  I have called this into [facility] and she will pay for out-of-pocket while I attempt to do a peer to peer review to have this approved by Medicare given that we have discussed multiple times the risk versus benefit of this medication.

## 2020-04-30 ENCOUNTER — Telehealth: Payer: Self-pay

## 2020-04-30 DIAGNOSIS — R2242 Localized swelling, mass and lump, left lower limb: Secondary | ICD-10-CM

## 2020-04-30 NOTE — Telephone Encounter (Signed)
Patient calls nurse line requesting to speak with PCP. Patient states that she has been taking flexeril, however, pain is still unmanaged. Patient requesting additional medication for pain management.   Offered to schedule patient appointment. Patient declined at this time.   To PCP  Talbot Grumbling, RN

## 2020-05-01 ENCOUNTER — Encounter: Payer: Self-pay | Admitting: Family Medicine

## 2020-05-01 NOTE — Telephone Encounter (Signed)
Called and left voicemail for Ms. Lapaglia explaining that we will send a referral to neurology in order to discuss the neurofibroma of her left leg that is likely contributing to her pain.  Pain may be multifactorial but given that in 2019 it was quite large and nothing was done about it I do believe that this needs to be further investigated given its location along the sciatic nerve and the patient's described pain that is worsening.  Patient recently increase Cymbalta to 60 mg.  Also encouraged her to increase her gabapentin.  We will start with 300 mg nightly.  Patient may then increase up to 600 mg/day total or 300 mg at night and 300 mg during the day.  She is on a reduced dose given her renal function.  Given that the pain is likely from a neurofibroma it is hopeful that the gabapentin will help.  To call back if she has any further questions or concerns we will also send her a my chart message instructing her on how to use the gabapentin.  [place] Danaija Eskridge, DO PGY-3, [facility]

## 2020-05-07 ENCOUNTER — Telehealth: Payer: Self-pay

## 2020-05-07 NOTE — Telephone Encounter (Signed)
Patient calls nurse line stating that she missed an appointment yesterday and is unsure which medication she is supposed to be increasing. Per most recent note, it seems that patient is to be increasing gabapentin. Patient states that she does not currently have this medication and would need a refill. However, patient states that she cannot afford medication at this time.  Will forward to PCP and Dr. Valentina Lucks for next steps  Talbot Grumbling, RN

## 2020-05-09 ENCOUNTER — Other Ambulatory Visit: Payer: Self-pay | Admitting: Family Medicine

## 2020-05-15 ENCOUNTER — Telehealth: Payer: Self-pay | Admitting: *Deleted

## 2020-05-15 ENCOUNTER — Other Ambulatory Visit: Payer: Self-pay

## 2020-05-15 ENCOUNTER — Ambulatory Visit (INDEPENDENT_AMBULATORY_CARE_PROVIDER_SITE_OTHER): Payer: Medicare Other | Admitting: Family Medicine

## 2020-05-15 ENCOUNTER — Encounter: Payer: Self-pay | Admitting: Family Medicine

## 2020-05-15 VITALS — BP 170/84 | HR 77 | Ht 62.0 in | Wt 141.0 lb

## 2020-05-15 DIAGNOSIS — R600 Localized edema: Secondary | ICD-10-CM

## 2020-05-15 DIAGNOSIS — R0600 Dyspnea, unspecified: Secondary | ICD-10-CM | POA: Diagnosis not present

## 2020-05-15 DIAGNOSIS — R2242 Localized swelling, mass and lump, left lower limb: Secondary | ICD-10-CM | POA: Diagnosis not present

## 2020-05-15 DIAGNOSIS — R0609 Other forms of dyspnea: Secondary | ICD-10-CM

## 2020-05-15 DIAGNOSIS — G8929 Other chronic pain: Secondary | ICD-10-CM

## 2020-05-15 DIAGNOSIS — M549 Dorsalgia, unspecified: Secondary | ICD-10-CM

## 2020-05-15 MED ORDER — GABAPENTIN 300 MG PO CAPS: 300.0000 mg | ORAL_CAPSULE | Freq: Two times a day (BID) | ORAL | 3 refills | Status: DC

## 2020-05-15 MED ORDER — CYCLOBENZAPRINE HCL 10 MG PO TABS: ORAL_TABLET | ORAL | 11 refills | Status: DC

## 2020-05-15 NOTE — Progress Notes (Signed)
   SUBJECTIVE:   CHIEF COMPLAINT / HPI:   Left leg pain: Patient in significant pain in left leg which is her pain has been chronically.  After reviewing medications appears that she has not started Cymbalta and she has not started gabapentin due to some confusion with the pharmacy.  Patient has only been taking tramadol nightly which is no longer helping with her pain.  He does also continue to take Tylenol which she states only help a minimal amount.  Patient almost in tears today due to her pain.  She denies any new trauma.  States it has just been getting chronically worse over the past few weeks.  She has an appointment with neurology on 6/25 however after discussion it may be that neurology is not the best place for her to be seen for management of the neurofibroma.  Recent x-ray of hip shows that pain is likely not coming from hip although she does continue to have pain with internal and external rotation on exam.  Patient has also noted that both of her legs have been swelling periodically and her right leg is swelling more than her left leg.  She denies any redness, rash.   MRI femur 12/2017: 2.4 x 2.7 x 3.6 cm mass along the course of left sciatic nerve just below the level of hamstring origin most consistent with a neurofibroma.  PERTINENT  PMH / PSH: Neurofibroma of left leg  OBJECTIVE:  BP (!) 170/84   Pulse 77   Ht 5\' 2"  (1.575 m)   Wt 141 lb (64 kg)   SpO2 94%   BMI 25.79 kg/m   General: NAD, pleasant Neck: Supple Cardiac: RRR, 2/6 murmur, BL leg swelling, nonpitting with R>L Respiratory: CTABL,  normal work of breathing Psych: AOx3, appropriate affect  ASSESSMENT/PLAN:   Chronic back pain Pain mostly along left leg.  Patient with multiple reasons to have pain such as severe PAD.  Also noted to have mass along course of left sciatic nerve that appears to be c/w neurofibroma.  Patient previously referred to neurology but do not believe that they would be able to manage  further.  Urgent referral placed to orthopedics who may be able to better manage neurofibroma.  Patient is likely poor surgical candidate but her leg pain has been increasing. -After discussion appears that patient has not been able to pick up her gabapentin.  She is also unable to afford the co-pay at [facility].  Patient given gift cards for her to be able to pick up her prescription today for renally adjusted gabapentin at 300 mg twice daily. -Patient also provided with refill of her Flexeril which has previously helped her with her pain.  Again discussed risks of patient's age and being on Flexeril however she acknowledges the risks and would like to decrease her pain.  Bilateral lower extremity edema Patient with lower extremity edema bilaterally with  R>L as she has had previously that has returned.  She has no shortness of breath or chest pain.  Patient does have history of blood clots but there is no tenderness on exam and no redness.  Will repeat echo to rule out cardiac causes.  Patient encouraged to use compression stockings and if her swelling continues may need to rule out DVT given history, but less likely given that it is bilateral.    [place] Annalysia Willenbring, DO PGY-3, [facility]

## 2020-05-15 NOTE — Telephone Encounter (Signed)
[place] Voucher:  Previous assistance?  No  What was provided? (2) $10 [facility] giftcards for med co-pay  Provider:  [facility] Staff:  Christen Bame, CMA  Note routed to [facility], CMA and Casimer Lanius, LCSW to add to [employer].

## 2020-05-15 NOTE — Patient Instructions (Addendum)
Thank you for coming to see me today. It was a pleasure! Today we talked about:   For your leg pain, I have sent gabapentin to [facility].  Please take this twice daily for your pain.  I would recommend stopping also seemed to help.  I refilled Flexeril at [facility] for you to continue to try at night.  Have also placed an urgent referral to orthopedics.  Please let me know if you do not hear from them soon.  Please be sure to ask your kidney doctor about your blood pressure as well as her leg swelling.  We have ordered ultrasound of your heart.  Please follow-up with me as scheduled or sooner as needed.  If you have any questions or concerns, please do not hesitate to call the office at [phone].  Take Care,   [place] Retaj Hilbun, DO

## 2020-05-16 ENCOUNTER — Ambulatory Visit (HOSPITAL_COMMUNITY)
Admission: RE | Admit: 2020-05-16 | Discharge: 2020-05-16 | Disposition: A | Discharge status: Home / Self Care | Payer: Medicare Other | Source: Ambulatory Visit | Attending: Family Medicine | Admitting: Family Medicine

## 2020-05-16 DIAGNOSIS — R0600 Dyspnea, unspecified: Secondary | ICD-10-CM

## 2020-05-16 DIAGNOSIS — E119 Type 2 diabetes mellitus without complications: Secondary | ICD-10-CM | POA: Insufficient documentation

## 2020-05-16 DIAGNOSIS — I10 Essential (primary) hypertension: Secondary | ICD-10-CM | POA: Insufficient documentation

## 2020-05-16 DIAGNOSIS — E785 Hyperlipidemia, unspecified: Secondary | ICD-10-CM | POA: Diagnosis not present

## 2020-05-16 DIAGNOSIS — J449 Chronic obstructive pulmonary disease, unspecified: Secondary | ICD-10-CM | POA: Diagnosis not present

## 2020-05-16 DIAGNOSIS — R600 Localized edema: Secondary | ICD-10-CM | POA: Diagnosis present

## 2020-05-16 DIAGNOSIS — R0609 Other forms of dyspnea: Secondary | ICD-10-CM

## 2020-05-16 NOTE — Progress Notes (Signed)
Echocardiogram 2D Echocardiogram has been performed.  [name] [name] [name] 05/16/2020, 10:17 AM

## 2020-05-19 NOTE — Assessment & Plan Note (Signed)
Patient with lower extremity edema bilaterally with  R>L as she has had previously that has returned.  She has no shortness of breath or chest pain.  Patient does have history of blood clots but there is no tenderness on exam and no redness.  Will repeat echo to rule out cardiac causes.  Patient encouraged to use compression stockings and if her swelling continues may need to rule out DVT given history, but less likely given that it is bilateral.

## 2020-05-19 NOTE — Assessment & Plan Note (Signed)
Pain mostly along left leg.  Patient with multiple reasons to have pain such as severe PAD.  Also noted to have mass along course of left sciatic nerve that appears to be c/w neurofibroma.  Patient previously referred to neurology but do not believe that they would be able to manage further.  Urgent referral placed to orthopedics who may be able to better manage neurofibroma.  Patient is likely poor surgical candidate but her leg pain has been increasing. -After discussion appears that patient has not been able to pick up her gabapentin.  She is also unable to afford the co-pay at [facility].  Patient given gift cards for her to be able to pick up her prescription today for renally adjusted gabapentin at 300 mg twice daily. -Patient also provided with refill of her Flexeril which has previously helped her with her pain.  Again discussed risks of patient's age and being on Flexeril however she acknowledges the risks and would like to decrease her pain.

## 2020-05-20 ENCOUNTER — Ambulatory Visit: Payer: Medicare Other | Admitting: Physician Assistant

## 2020-05-21 ENCOUNTER — Telehealth: Payer: Self-pay | Admitting: Family Medicine

## 2020-05-21 NOTE — Telephone Encounter (Signed)
Called to check on patient after initiating gabapentin.  She states that she does not feel that the medicine is helping her pain very much.  She has an appointment set up with orthopedics on 05/29/2020 and she thinks that she can make it until that appointment.  She states her pain is only worse when she is walking.  She states that still is occurring in the left side of her buttocks.  Patient does report that the gabapentin has made her drowsy.  Advised her to only take it at night prior to bed to see if it will help her get some relief while sleeping.  Patient to reach out if she has any further questions or concerns and I will follow along in chart.  [place] Tavien Chestnut, DO PGY-3, [facility]

## 2020-05-29 ENCOUNTER — Ambulatory Visit: Payer: Self-pay

## 2020-05-29 ENCOUNTER — Encounter: Payer: Self-pay | Admitting: Orthopedic Surgery

## 2020-05-29 ENCOUNTER — Ambulatory Visit (INDEPENDENT_AMBULATORY_CARE_PROVIDER_SITE_OTHER): Payer: Medicare Other | Admitting: Orthopedic Surgery

## 2020-05-29 VITALS — Ht 62.0 in | Wt 141.0 lb

## 2020-05-29 DIAGNOSIS — I739 Peripheral vascular disease, unspecified: Secondary | ICD-10-CM | POA: Diagnosis not present

## 2020-05-29 DIAGNOSIS — M79672 Pain in left foot: Secondary | ICD-10-CM

## 2020-05-29 DIAGNOSIS — M545 Low back pain, unspecified: Secondary | ICD-10-CM

## 2020-06-02 ENCOUNTER — Encounter: Payer: Self-pay | Admitting: Orthopedic Surgery

## 2020-06-02 NOTE — Progress Notes (Signed)
Office Visit Note   Patient: [name] [name]           Date of Birth: 12-24-1946           MRN: [id number] Visit Date: 05/29/2020              Requested by: McDiarmid, Blane Ohara, MD [place],  [place] [id number] PCP: Shirley, [place], DO  Chief Complaint  Patient presents with  . Left Leg - Pain, New Patient (Initial Visit)  . Lower Back - Pain  . Left Ankle - Pain      HPI: Patient is a 74 year old woman who is seen for evaluation for complaints of pain and swelling in the left leg.  Patient is status post stenting for peripheral vascular disease from vascular vein surgery.  Patient has an old MRI scan of her lumbar spine that shows no herniated disks.  Patient denies any acute symptoms.  Patient complains of a throbbing pain in the left foot and leg with swelling and pain that gets worse with walking she uses a cane she is diabetic with chronic kidney disease patient states she is unsure why she is here today.  Patient states the claudication pain comes with walking short distances.  She denies any rest pain.  Pain radiates down the lateral aspect of her ankle.  Assessment & Plan: Visit Diagnoses:  1. Left foot pain   2. Midline low back pain, unspecified chronicity, unspecified whether sciatica present   3. PVD (peripheral vascular disease) ([place])     Plan: We will make a consult with vascular vein surgery.  Patient is currently smoking recommended smoking cessation.  Follow-Up Instructions: Return if symptoms worsen or fail to improve.   Ortho Exam  Patient is alert, oriented, no adenopathy, well-dressed, normal affect, normal respiratory effort. Examination patient has pain with straight leg raise on the left there is no swelling in the left leg she has pain over the lateral malleolus.  Patient has venous stasis swelling in the right leg.  Patient has pain with elevating her leg a Doppler was used that showed some monophasic pulses.  Imaging: No results  found. No images are attached to the encounter.  Labs: Lab Results  Component Value Date   HGBA1C 6.8 04/24/2020   HGBA1C 7.0 01/09/2020   HGBA1C 6.3 09/19/2019   ESRSEDRATE 4 03/13/2012   LABURIC 3.9 07/11/2009   REPTSTATUS 04/16/2017 FINAL 04/09/2017   CULT  04/09/2017    NO GROWTH 6 DAYS Performed at [facility], [place] [address] [place], [place] [address]      Lab Results  Component Value Date   ALBUMIN 3.9 03/14/2020   ALBUMIN 4.1 01/04/2020   ALBUMIN 4.1 09/28/2019   LABURIC 3.9 07/11/2009    Lab Results  Component Value Date   MG 2.0 11/29/2017   No results found for: VD25OH  No results found for: PREALBUMIN CBC EXTENDED Latest Ref Rng & Units 03/14/2020 01/04/2020 09/28/2019  WBC 4.0 - 10.5 K/uL 95.1(HH) 106.9(HH) 68.5(HH)  RBC 3.87 - 5.11 MIL/uL 4.31 4.47 4.32  HGB 12.0 - 15.0 g/dL 11.9(L) 12.3 12.0  HCT 36.0 - 46.0 % 40.2 40.5 38.6  PLT 150 - 400 K/uL 222 257 225  NEUTROABS 1.7 - 7.7 K/uL 8.6(H) 6.6 4.1  LYMPHSABS 0.7 - 4.0 K/uL 82.7(H) 88.2(H) 61.0(H)     Body mass index is 25.79 kg/m.  Orders:  Orders Placed This Encounter  Procedures  . XR Lumbar Spine 2-3 Views  .  XR Ankle 2 Views Left  . Ambulatory referral to Vascular Surgery  . VAS [place] ABI WITH/WO TBI   No orders of the defined types were placed in this encounter.    Procedures: No procedures performed  Clinical Data: No additional findings.  ROS:  All other systems negative, except as noted in the HPI. Review of Systems  Objective: Vital Signs: Ht 5\' 2"  (1.575 m)   Wt 141 lb (64 kg)   BMI 25.79 kg/m   Specialty Comments:  No specialty comments available.  PMFS History: Patient Active Problem List   Diagnosis Date Noted  . Muscle spasm, nocturnal 03/07/2020  . Bilateral lower extremity edema 06/22/2019  . Pulmonary nodules 01/04/2018  . Mass of left lower leg 01/04/2018  . Chronic kidney disease (CKD), stage III (moderate) ([place]) 06/04/2016  . Chronic back pain  10/02/2015  . DM (diabetes mellitus), type 2 with peripheral vascular complications ([place]) 20/94/7096  . Chronic lymphocytic leukemia ([place]) 04/11/2009  . PERIPHERAL VASCULAR DISEASE 10/13/2007  . HYPERCHOLESTEROLEMIA 02/23/2007  . ANXIETY 02/23/2007  . TOBACCO DEPENDENCE 02/23/2007  . Essential hypertension 02/23/2007  . COPD (chronic obstructive pulmonary disease) ([place]) 02/23/2007  . GASTROESOPHAGEAL REFLUX, NO ESOPHAGITIS 02/23/2007   Past Medical History:  Diagnosis Date  . Allergy    environmental  . Asthma   . CLL (chronic lymphoblastic leukemia)   . Constipation 11/15/2011  . COPD (chronic obstructive pulmonary disease) ([place])   . Diabetes mellitus without complication ([place])   . GERD (gastroesophageal reflux disease)   . Hyperlipidemia   . Hypertension   . PVD (peripheral vascular disease) ([place])   . Stroke [facility]) 1998   in 1998 due to tumor-right side    Family History  Problem Relation Age of Onset  . Heart disease Mother   . Asthma Mother   . Cancer Mother        uterine   . Depression Mother   . Heart attack Mother 102  . Hyperlipidemia Mother   . Hypertension Mother   . Stroke Mother   . Kidney disease Mother   . Heart attack Sister   . Stroke Sister   . Depression Sister   . Diabetes Sister   . Hyperlipidemia Sister   . Depression Sister   . Diabetes Sister   . Hyperlipidemia Sister   . HIV/AIDS Sister   . Diabetes Brother   . Asthma Brother   . Hypertension Brother   . Cancer Maternal Aunt        lung  . Heart disease Maternal Grandmother   . Heart attack Maternal Grandmother   . Cancer Brother        colon  . HIV/AIDS Brother   . Colon cancer Neg Hx     Past Surgical History:  Procedure Laterality Date  . ABI  02/2012   ABI <0.65 BL 02/2012  . BRAIN MENINGIOMA EXCISION    . Bypass grafting of RLE for PAD claudication     . CESAREAN SECTION     1974, 78 ,80  . CHOLECYSTECTOMY, LAPAROSCOPIC    . COLONOSCOPY  2014   [place], [place]  .  ESOPHAGEAL DILATION     Social History   Occupational History  . Occupation: DISABLED    Employer: DISABLED  Tobacco Use  . Smoking status: Current Some Day Smoker    Packs/day: 0.25    Years: 30.00    Pack years: 7.50    Types: Cigarettes    Start date: 01/01/1976  . Smokeless  tobacco: Never Used  . Tobacco comment: working on cutting back  Substance and Sexual Activity  . Alcohol use: No    Alcohol/week: 0.0 standard drinks  . Drug use: No  . Sexual activity: Yes

## 2020-06-06 ENCOUNTER — Ambulatory Visit: Payer: Medicare Other | Admitting: Family Medicine

## 2020-06-09 ENCOUNTER — Ambulatory Visit (INDEPENDENT_AMBULATORY_CARE_PROVIDER_SITE_OTHER): Payer: Medicare Other | Admitting: Family Medicine

## 2020-06-09 ENCOUNTER — Encounter: Payer: Self-pay | Admitting: Family Medicine

## 2020-06-09 ENCOUNTER — Other Ambulatory Visit: Payer: Self-pay

## 2020-06-09 DIAGNOSIS — I5032 Chronic diastolic (congestive) heart failure: Secondary | ICD-10-CM | POA: Diagnosis not present

## 2020-06-09 MED ORDER — FUROSEMIDE 20 MG PO TABS: 20.0000 mg | ORAL_TABLET | Freq: Every day | ORAL | 3 refills | Status: DC

## 2020-06-09 NOTE — Patient Instructions (Signed)
Thank you for coming to see me today. It was a pleasure! Today we talked about:   For your leg swelling, I have started you on a medication called Lasix.  Please take 1 tablet once daily.  If you do not notice that your swelling has improved or that you are urinating more than please increase dose to 2 tablets once daily.  Please follow-up with me on 6/25 at 8:50am or sooner as needed.  If you have any questions or concerns, please do not hesitate to call the office at [phone].  Take Care,   [place] Orby Tangen, DO

## 2020-06-09 NOTE — Progress Notes (Signed)
   SUBJECTIVE:   CHIEF COMPLAINT / HPI:   Lower extremity edema  leg pain: Patient reports that she has had continued swelling in both of her legs.  She recently saw orthopedics who did not mention anything regarding her neurofibroma but did recommend that she follow-up with her vascular surgeon.  Patient has been following closely with vascular regularly and it is likely that this is contributing to her leg pain however she most likely has multiple things going on.  Will reach out to orthopedics to see if there is anything regarding the neurofibroma that could be done or if they believe that is not contributing to her pain.  Patient reports that she has not had any chest pain, shortness of breath.  She states that she notices that her leg swelling gets worse at the end of the day.  She reports that she cannot put compression stockings on due to pain in her legs.  She reports that she believes the gabapentin is helping her somewhat.  Patient's last weight in our office was 141 pounds and today it is 146 pounds.     PERTINENT  PMH / PSH: HFpEF, T2DM, hypertension, COPD, GERD, CKD stage III, CLL, anxiety, PVD, tobacco dependence  OBJECTIVE:  BP (!) 161/70   Pulse 80   Temp 99.1 F (37.3 C) (Oral)   Wt 146 lb (66.2 kg)   SpO2 96%   BMI 26.70 kg/m   General: NAD, pleasant Neck: Supple Respiratory: normal work of breathing Psych: AOx3, appropriate affect  ASSESSMENT/PLAN:   (HFpEF) heart failure with preserved ejection fraction ([place]) Patient with continued bilateral lower extremity edema.  No shortness of breath.  Recent echo shows EF of 70-75%.  Moderate concentric LVH with elevated left ventricular end-diastolic pressure.  Mildly dilated right atria.  Given patient's continued edema and new diagnosis of HFpEF, will initiate furosemide.  Patient to start on 20 mg once daily and based on urine output will increase to 40 mg until she reaches diuresis.  Patient also with CKD stage IV and is  following closely with nephrology.  With new initiation of furosemide with the patient to have close follow-up with me on June 21.  Patient advised of ED return precautions and voiced understanding of plan.    [place] Nekeisha Aure, DO PGY-3, [facility]

## 2020-06-11 DIAGNOSIS — I5030 Unspecified diastolic (congestive) heart failure: Secondary | ICD-10-CM | POA: Insufficient documentation

## 2020-06-11 NOTE — Assessment & Plan Note (Signed)
Patient with continued bilateral lower extremity edema.  No shortness of breath.  Recent echo shows EF of 70-75%.  Moderate concentric LVH with elevated left ventricular end-diastolic pressure.  Mildly dilated right atria.  Given patient's continued edema and new diagnosis of HFpEF, will initiate furosemide.  Patient to start on 20 mg once daily and based on urine output will increase to 40 mg until she reaches diuresis.  Patient also with CKD stage IV and is following closely with nephrology.  With new initiation of furosemide with the patient to have close follow-up with me on June 21.  Patient advised of ED return precautions and voiced understanding of plan.

## 2020-06-16 ENCOUNTER — Ambulatory Visit (INDEPENDENT_AMBULATORY_CARE_PROVIDER_SITE_OTHER): Payer: Medicare Other | Admitting: Family Medicine

## 2020-06-16 ENCOUNTER — Encounter: Payer: Self-pay | Admitting: Family Medicine

## 2020-06-16 ENCOUNTER — Other Ambulatory Visit: Payer: Self-pay

## 2020-06-16 VITALS — BP 138/58 | HR 79 | Ht 62.0 in | Wt 148.2 lb

## 2020-06-16 DIAGNOSIS — I5032 Chronic diastolic (congestive) heart failure: Secondary | ICD-10-CM

## 2020-06-16 DIAGNOSIS — R600 Localized edema: Secondary | ICD-10-CM

## 2020-06-16 NOTE — Progress Notes (Signed)
   SUBJECTIVE:   CHIEF COMPLAINT / HPI:   Lower extremity edema  leg pain: Patient reports that she has had continued swelling in both of her legs.  She recently saw orthopedics who did not mention anything regarding her neurofibroma but did recommend that she follow-up with her vascular surgeon.  Patient has been following closely with vascular regularly and it is likely that this is contributing to her leg pain however she most likely has multiple things going on.    Orthopedics states she needs to have a vascular evaluation before he can address the neurofibroma.  Patient reports that she has not had any chest pain, shortness of breath.  She states that she notices that her leg swelling gets worse at the end of the day.  She reports that she has tried compression stockings and she is ordering more.  She reports that the Lasix caused her to urinate more but she did not notice any improvement in her swelling.  She reports that she believes the gabapentin is helping her somewhat, but she is still having significant pain.  Echo 05/16/2020: EF of 70-75%.  Moderate concentric left ventricular hypertrophy.  Elevated left ventricular end-diastolic pressure.  PERTINENT  PMH / WAQ:LRJPV, T2DM, hypertension, COPD, GERD, CKD stage III, CLL, anxiety, PVD, tobacco dependence  OBJECTIVE:  BP (!) 138/58   Pulse 79   Ht 5\' 2"  (1.575 m)   Wt 148 lb 4 oz (67.2 kg)   SpO2 97%   BMI 27.12 kg/m   General: NAD, pleasant Neck: Supple Cardiovascular: RRR, no m/r/g, nonpitting BL LE edema Respiratory: normal work of breathing Psych: AOx3, appropriate affect  ASSESSMENT/PLAN:   Bilateral lower extremity edema Patient with recent echo and lower extremity edema that would diagnose patient with HFpEF.  Patient given Lasix to take for her leg swelling as needed.  She is continuing to follow-up with nephrology.  Patient not yet established with cardiology.  Referral placed today for patient to establish care with  cardiology given her new diagnosis of HFpEF.  Patient with history of significant vascular disease and is also following up with vascular surgeon for PAD.  Patient continues to smoke despite multiple efforts in order to aid her in smoking cessation.  Patient currently with no chest pain or shortness of breath.  Return precautions discussed patient voiced understanding.    [place] Kaysen Sefcik, DO PGY-3, [facility]

## 2020-06-17 LAB — BASIC METABOLIC PANEL
BUN/Creatinine Ratio: 10 — ABNORMAL LOW (ref 12–28)
BUN: 16 mg/dL (ref 8–27)
CO2: 25 mmol/L (ref 20–29)
Calcium: 8.8 mg/dL (ref 8.7–10.3)
Chloride: 103 mmol/L (ref 96–106)
Creatinine, Ser: 1.63 mg/dL — ABNORMAL HIGH (ref 0.57–1.00)
GFR calc Af Amer: 36 mL/min/{1.73_m2} — ABNORMAL LOW (ref >59)
GFR calc non Af Amer: 31 mL/min/{1.73_m2} — ABNORMAL LOW (ref >59)
Glucose: 133 mg/dL — ABNORMAL HIGH (ref 65–99)
Potassium: 4.2 mmol/L (ref 3.5–5.2)
Sodium: 140 mmol/L (ref 134–144)

## 2020-06-19 ENCOUNTER — Emergency Department (HOSPITAL_COMMUNITY): Payer: Medicare Other

## 2020-06-19 ENCOUNTER — Emergency Department (HOSPITAL_BASED_OUTPATIENT_CLINIC_OR_DEPARTMENT_OTHER): Payer: Medicare Other

## 2020-06-19 ENCOUNTER — Emergency Department (HOSPITAL_COMMUNITY)
Admission: EM | Admit: 2020-06-19 | Discharge: 2020-06-19 | Disposition: A | Discharge status: Home / Self Care | Payer: Medicare Other | Attending: Emergency Medicine | Admitting: Emergency Medicine

## 2020-06-19 ENCOUNTER — Other Ambulatory Visit: Payer: Self-pay

## 2020-06-19 ENCOUNTER — Encounter (HOSPITAL_COMMUNITY): Payer: Self-pay | Admitting: *Deleted

## 2020-06-19 DIAGNOSIS — J449 Chronic obstructive pulmonary disease, unspecified: Secondary | ICD-10-CM | POA: Insufficient documentation

## 2020-06-19 DIAGNOSIS — E1122 Type 2 diabetes mellitus with diabetic chronic kidney disease: Secondary | ICD-10-CM | POA: Diagnosis not present

## 2020-06-19 DIAGNOSIS — R0602 Shortness of breath: Secondary | ICD-10-CM | POA: Diagnosis not present

## 2020-06-19 DIAGNOSIS — Z7902 Long term (current) use of antithrombotics/antiplatelets: Secondary | ICD-10-CM | POA: Insufficient documentation

## 2020-06-19 DIAGNOSIS — R609 Edema, unspecified: Secondary | ICD-10-CM

## 2020-06-19 DIAGNOSIS — N183 Chronic kidney disease, stage 3 unspecified: Secondary | ICD-10-CM | POA: Insufficient documentation

## 2020-06-19 DIAGNOSIS — F17210 Nicotine dependence, cigarettes, uncomplicated: Secondary | ICD-10-CM | POA: Diagnosis not present

## 2020-06-19 DIAGNOSIS — Z79899 Other long term (current) drug therapy: Secondary | ICD-10-CM | POA: Diagnosis not present

## 2020-06-19 DIAGNOSIS — R52 Pain, unspecified: Secondary | ICD-10-CM | POA: Diagnosis not present

## 2020-06-19 DIAGNOSIS — R600 Localized edema: Secondary | ICD-10-CM | POA: Insufficient documentation

## 2020-06-19 DIAGNOSIS — J45909 Unspecified asthma, uncomplicated: Secondary | ICD-10-CM | POA: Insufficient documentation

## 2020-06-19 DIAGNOSIS — I129 Hypertensive chronic kidney disease with stage 1 through stage 4 chronic kidney disease, or unspecified chronic kidney disease: Secondary | ICD-10-CM | POA: Insufficient documentation

## 2020-06-19 DIAGNOSIS — M7989 Other specified soft tissue disorders: Secondary | ICD-10-CM | POA: Diagnosis not present

## 2020-06-19 DIAGNOSIS — Z7984 Long term (current) use of oral hypoglycemic drugs: Secondary | ICD-10-CM | POA: Insufficient documentation

## 2020-06-19 LAB — BASIC METABOLIC PANEL
Anion gap: 9 (ref 5–15)
BUN: 20 mg/dL (ref 8–23)
CO2: 26 mmol/L (ref 22–32)
Calcium: 8.6 mg/dL — ABNORMAL LOW (ref 8.9–10.3)
Chloride: 107 mmol/L (ref 98–111)
Creatinine, Ser: 1.71 mg/dL — ABNORMAL HIGH (ref 0.44–1.00)
GFR calc Af Amer: 34 mL/min — ABNORMAL LOW (ref >60)
GFR calc non Af Amer: 29 mL/min — ABNORMAL LOW (ref >60)
Glucose, Bld: 128 mg/dL — ABNORMAL HIGH (ref 70–99)
Potassium: 5 mmol/L (ref 3.5–5.1)
Sodium: 142 mmol/L (ref 135–145)

## 2020-06-19 LAB — CBC
HCT: 36.5 % (ref 36.0–46.0)
Hemoglobin: 10.8 g/dL — ABNORMAL LOW (ref 12.0–15.0)
MCH: 27.5 pg (ref 26.0–34.0)
MCHC: 29.6 g/dL — ABNORMAL LOW (ref 30.0–36.0)
MCV: 92.9 fL (ref 80.0–100.0)
Platelets: 224 10*3/uL (ref 150–400)
RBC: 3.93 MIL/uL (ref 3.87–5.11)
RDW: 18 % — ABNORMAL HIGH (ref 11.5–15.5)
WBC: 101.7 10*3/uL (ref 4.0–10.5)
nRBC: 0.1 % (ref 0.0–0.2)

## 2020-06-19 LAB — BRAIN NATRIURETIC PEPTIDE: B Natriuretic Peptide: 29.2 pg/mL (ref 0.0–100.0)

## 2020-06-19 MED ORDER — CEPHALEXIN 500 MG PO CAPS
500.0000 mg | ORAL_CAPSULE | Freq: Four times a day (QID) | ORAL | 0 refills | Status: DC
Start: 2020-06-19 — End: 2020-07-10

## 2020-06-19 MED ORDER — SODIUM CHLORIDE 0.9% FLUSH: 3.0000 mL | Freq: Once | INTRAVENOUS | Status: DC

## 2020-06-19 NOTE — Discharge Instructions (Signed)
A DVT study was obtained today which did not show any evidence of blood clot in your legs.  Take antibiotic as prescribed to treat for potential skin infection causing leg swelling and pain.  Continue taking fluid pills previously prescribed by your doctor and follow-up closely with your primary care provider for further care.  Return if you have any concerns.

## 2020-06-19 NOTE — ED Provider Notes (Signed)
[facility] EMERGENCY DEPARTMENT Provider Note   CSN: [id number] Arrival date & time: 06/19/20  1532     History Chief Complaint  Patient presents with  . Leg Swelling    [name] [name] is a 74 y.o. female.  The history is provided by the patient and medical records. No language interpreter was used.     74 year old female with history of CLL, COPD, diabetes, hyperlipidemia, hypertension, prior stroke, presenting complaining of leg swelling.  Patient states for more than a month she has noticed increased bilateral leg swelling.  She reports the swelling started when her nephrologist took her off from her diuretic medication.  She also complaining of tightness about her legs, moderate to severe, persistent, not seem to be improving with anything that she tries.  She does not complain of any fever chills no lightheadedness or dizziness no chest pain or shortness of breath no abdominal pain noticing any abdominal swelling.  She mentioned her next follow-up appointment with her doctor is in August but she cannot wait for that long to have her leg assessed.  When asked if she has had any prior history of DVT patient states several years back she did and states that she has stent placed in both of her legs.  She report not being treated for her CLL at this time.  She denies any fever chills or rash.  Past Medical History:  Diagnosis Date  . Allergy    environmental  . Asthma   . CLL (chronic lymphoblastic leukemia)   . Constipation 11/15/2011  . COPD (chronic obstructive pulmonary disease) ([place])   . Diabetes mellitus without complication ([place])   . GERD (gastroesophageal reflux disease)   . Hyperlipidemia   . Hypertension   . PVD (peripheral vascular disease) ([place])   . Stroke [facility]) 1998   in 1998 due to tumor-right side    Patient Active Problem List   Diagnosis Date Noted  . (HFpEF) heart failure with preserved ejection fraction ([place]) 06/11/2020  . Muscle spasm,  nocturnal 03/07/2020  . Bilateral lower extremity edema 06/22/2019  . Pulmonary nodules 01/04/2018  . Mass of left lower leg 01/04/2018  . Chronic kidney disease (CKD), stage III (moderate) ([place]) 06/04/2016  . Chronic back pain 10/02/2015  . DM (diabetes mellitus), type 2 with peripheral vascular complications ([place]) 02/54/2706  . Chronic lymphocytic leukemia ([place]) 04/11/2009  . PERIPHERAL VASCULAR DISEASE 10/13/2007  . HYPERCHOLESTEROLEMIA 02/23/2007  . ANXIETY 02/23/2007  . TOBACCO DEPENDENCE 02/23/2007  . Essential hypertension 02/23/2007  . COPD (chronic obstructive pulmonary disease) ([place]) 02/23/2007  . GASTROESOPHAGEAL REFLUX, NO ESOPHAGITIS 02/23/2007    Past Surgical History:  Procedure Laterality Date  . ABI  02/2012   ABI <0.65 BL 02/2012  . BRAIN MENINGIOMA EXCISION    . Bypass grafting of RLE for PAD claudication     . CESAREAN SECTION     1974, 78 ,80  . CHOLECYSTECTOMY, LAPAROSCOPIC    . COLONOSCOPY  2014   [place], [place]  . ESOPHAGEAL DILATION       OB History   No obstetric history on file.     Family History  Problem Relation Age of Onset  . Heart disease Mother   . Asthma Mother   . Cancer Mother        uterine   . Depression Mother   . Heart attack Mother 69  . Hyperlipidemia Mother   . Hypertension Mother   . Stroke Mother   . Kidney disease Mother   .  Heart attack Sister   . Stroke Sister   . Depression Sister   . Diabetes Sister   . Hyperlipidemia Sister   . Depression Sister   . Diabetes Sister   . Hyperlipidemia Sister   . HIV/AIDS Sister   . Diabetes Brother   . Asthma Brother   . Hypertension Brother   . Cancer Maternal Aunt        lung  . Heart disease Maternal Grandmother   . Heart attack Maternal Grandmother   . Cancer Brother        colon  . HIV/AIDS Brother   . Colon cancer Neg Hx     Social History   Tobacco Use  . Smoking status: Current Some Day Smoker    Packs/day: 0.25    Years: 30.00    Pack years: 7.50     Types: Cigarettes    Start date: 01/01/1976  . Smokeless tobacco: Never Used  . 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 Vaping Use: Never used  Substance Use Topics  . Alcohol use: No    Alcohol/week: 0.0 standard drinks  . Drug use: No    Home Medications Prior to Admission medications   Medication Sig Start Date End Date Taking? Authorizing Provider  albuterol (ACCUNEB) 0.63 MG/3ML nebulizer solution Take 3 mLs (0.63 mg total) by nebulization every 6 (six) hours as needed for wheezing. 08/24/19   Shirley, [place], DO  albuterol (VENTOLIN HFA) 108 (90 Base) MCG/ACT inhaler INHALE 2 PUFFS BY MOUTH EVERY 6 HOURS AS NEEDED FOR WHEEZING OR SHORTNESS OF BREATH 01/11/20   Shirley, [place], DO  Blood Glucose Monitoring Suppl (ONETOUCH VERIO) w/Device KIT 1 Device by Does not apply route daily. 04/26/18   Smiley Houseman, MD  BREO ELLIPTA 100-25 MCG/INH AEPB Inhale 1 puff by mouth every day 05/09/20   Shirley, [place], DO  clopidogrel (PLAVIX) 75 MG tablet Take 1 tablet by mouth every day 01/23/20   Shirley, [place], DO  cyclobenzaprine (FLEXERIL) 10 MG tablet Take 1 tablet by mouth 3 times a day as needed for muscle spasm 05/15/20   Shirley, [place], DO  diclofenac sodium (VOLTAREN) 1 % GEL Apply 2 g topically 4 (four) times daily. 06/20/19   Shirley, [place], DO  diclofenac Sodium (VOLTAREN) 1 % GEL Apply 2 grams topically four times a day 04/09/20   Shirley, [place], DO  DULoxetine (CYMBALTA) 30 MG capsule TAKE ONE CAPSULE BY MOUTH EVERY DAY 04/09/20   Shirley, [place], DO  empagliflozin (JARDIANCE) 10 MG TABS tablet Take 10 mg by mouth daily. 01/11/20   Shirley, [place], DO  famotidine (PEPCID) 20 MG tablet Take 2 tablets by mouth every day 04/28/20   Shirley, [place], DO  Fluticasone-Salmeterol [facility] INHUB) 250-50 MCG/DOSE AEPB Inhale 1 puff into the lungs 2 (two) times daily. 01/11/20   Shirley, [place], DO  furosemide (LASIX) 20 MG tablet Take 1 tablet (20 mg total) by mouth daily. 06/09/20    Shirley, [place], DO  gabapentin (NEURONTIN) 300 MG capsule Take 1 capsule (300 mg total) by mouth 2 (two) times daily. 05/15/20   Shirley, [place], DO  glucose blood [facility] VERIO) test strip Use to check blood sugar once a day 04/26/18   Smiley Houseman, MD  hydrALAZINE (APRESOLINE) 50 MG tablet Take 50 mg by mouth 3 (three) times daily. 04/11/20   [provider]  Lancets Glory Rosebush ULTRASOFT) lancets Use to check blood sugar once a day 04/26/18   Smiley Houseman, MD  Lido-Capsaicin-Men-Methyl  Sal 0.5-0.035-5-20 % PTCH Apply 1 patch topically daily. As needed 09/19/19   Shirley, [place], DO  metFORMIN (GLUCOPHAGE) 500 MG tablet Take 1 tablet by mouth every day with breakfast. 01/11/20   Shirley, [place], DO  montelukast (SINGULAIR) 10 MG tablet Take 1 tablet by mouth at bedtime 04/28/20   Shirley, [place], DO  nitroGLYCERIN (NITROSTAT) 0.4 MG SL tablet Dissolve 1 tablet under tongue every 5 minutes, up to 3 doses for chest pain 01/11/20   Shirley, [place], DO  simvastatin (ZOCOR) 20 MG tablet Take 1 tablet by mouth at bedtime 04/28/20   Shirley, [place], DO  traMADol (ULTRAM) 50 MG tablet Take 1 tablet by mouth every day as needed for moderate pain 03/11/20   Shirley, [place], DO  baclofen (LIORESAL) 10 MG tablet Take 1 tablet (10 mg total) by mouth daily at 6 PM. 08/24/19   Shirley, [place], DO  cyclobenzaprine (FLEXERIL) 5 MG tablet Take 1 tablet (5 mg total) by mouth 3 (three) times daily as needed for muscle spasms. 09/19/19   Shirley, [place], DO  famotidine (PEPCID) 20 MG tablet Take 2 tablets (40 mg total) by mouth daily. 06/20/19   Shirley, [place], DO  metFORMIN (GLUCOPHAGE) 500 MG tablet Take 1 tablet (500 mg total) by mouth daily with breakfast. Patient taking differently: Take 500 mg by mouth 2 (two) times daily with a meal.  01/10/19   Enid Derry, [place], DO  metoprolol tartrate (LOPRESSOR) 25 MG tablet TAKE ONE HALF TABLET BY MOUTH TWICE DAILY 08/24/18   Shirley, [place], DO  nitroGLYCERIN  (NITROSTAT) 0.4 MG SL tablet DISSOLVE ONE TABLET UNDER THE TONGUE EVERY 5 MINUTES, UP TO 3 DOSES FOR CHEST PAIN 10/26/18   Shirley, [place], DO  PROAIR HFA 108 [phone] Base) MCG/ACT inhaler INHALE 2 PUFFS BY MOUTH EVERY 6 HOURS AS NEEDED FOR WHEEZING OR SHORTNESS OF BREATH 11/27/18   Enid Derry, [place], DO  simvastatin (ZOCOR) 20 MG tablet TAKE ONE TABLET BY MOUTH AT BEDTIME 08/24/18   Shirley, [place], DO    Allergies    Aspirin and Baclofen  Review of Systems   Review of Systems  All other systems reviewed and are negative.   Physical Exam Updated Vital Signs BP (!) 132/104 (BP Location: Right Arm)   Pulse 72   Temp 98.7 F (37.1 C) (Oral)   Resp 16   Ht _0  (1.575 m)   Wt 67.1 kg   SpO2 98%   BMI 27.07 kg/m   Physical Exam Vitals and nursing note reviewed.  Constitutional:      General: She is not in acute distress.    Appearance: She is well-developed.  HENT:     Head: Atraumatic.  Eyes:     Conjunctiva/sclera: Conjunctivae normal.  Neck:     Comments: Right JVD Cardiovascular:     Rate and Rhythm: Normal rate and regular rhythm.     Heart sounds: Gallop present.   Pulmonary:     Effort: Pulmonary effort is normal.     Breath sounds: Rales (Faint crackles heard at lung bases) present.  Abdominal:     Palpations: Abdomen is soft.     Tenderness: There is no abdominal tenderness.  Musculoskeletal:        General: Swelling (pitting edema to bilateral lower extremities right greater than left with tenderness to palpation but no significant erythema or warmth appreciated.  Intact status pedis pulse) present.     Cervical back: Neck supple.  Skin:    Findings: No rash.  Neurological:  Mental Status: She is alert and oriented to person, place, and time.  Psychiatric:        Mood and Affect: Mood normal.     ED Results / Procedures / Treatments   Labs (all labs ordered are listed, but only abnormal results are displayed) Labs Reviewed  BASIC METABOLIC PANEL -  Abnormal; Notable for the following components:      Result Value   Glucose, Bld 128 (*)    Creatinine, Ser 1.71 (*)    Calcium 8.6 (*)    GFR calc non Af Amer 29 (*)    GFR calc Af Amer 34 (*)    All other components within normal limits  CBC - Abnormal; Notable for the following components:   WBC 101.7 (*)    Hemoglobin 10.8 (*)    MCHC 29.6 (*)    RDW 18.0 (*)    All other components within normal limits  BRAIN NATRIURETIC PEPTIDE    EKG EKG Interpretation  Date/Time:  Thursday June 19 2020 15:45:52 EDT Ventricular Rate:  81 PR Interval:  162 QRS Duration: 80 QT Interval:  360 QTC Calculation: 418 R Axis:   77 Text Interpretation: Normal sinus rhythm Nonspecific ST and T wave abnormality Abnormal ECG No significant change since prior 3/20 Confirmed by Aletta Edouard [phone]) on 06/19/2020 6:53:00 PM   Radiology DG Chest 2 View  Result Date: 06/19/2020 CLINICAL DATA:  Leg swelling x1 month. EXAM: CHEST - 2 VIEW COMPARISON:  March 18, 2019 FINDINGS: Very mild diffusely increased interstitial lung markings are seen. There is no evidence of focal consolidation, pleural effusion or pneumothorax. The heart size and mediastinal contours are within normal limits. The visualized skeletal structures are unremarkable. IMPRESSION: Very mild diffusely increased interstitial lung markings, which may be chronic in nature. Electronically Signed   By: Virgina Norfolk M.D.   On: 06/19/2020 16:09    Procedures Procedures (including critical care time)  Medications Ordered in ED Medications  sodium chloride flush (NS) 0.9 % injection 3 mL (has no administration in time range)    ED Course  I have reviewed the triage vital signs and the nursing notes.  Pertinent labs & imaging results that were available during my care of the patient were reviewed by me and considered in my medical decision making (see chart for details).  Clinical Course as of Jun 19 2108  Thu Jun 19, 2553  [id number]  74 year old female here with increased swelling of legs left greater than right over the course of a month.  Denies any drainage of any fluid.  She says her kidney doctor had taken off her diuretic.  Some shortness of breath.  Duplex negative for any acute DVT.  History of CLL with elevated white count.  No fever here.   [MB]    Clinical Course User Index [MB] Hayden Rasmussen, MD   MDM Rules/Calculators/A&P                          BP (!) 132/104 (BP Location: Right Arm)   Pulse 83   Temp 98.7 F (37.1 C) (Oral)   Resp 17   Ht _0  (1.575 m)   Wt 67.1 kg   SpO2 96%   BMI 27.07 kg/m   Final Clinical Impression(s) / ED Diagnoses Final diagnoses:  Bilateral lower extremity edema    Rx / DC Orders ED Discharge Orders         Ordered  cephALEXin (KEFLEX) 500 MG capsule  4 times daily     Discontinue  Reprint     06/19/20 2114         7:04 PM Patient here with progressive worsening bilateral lower extremity edema for the past month.  Was last seen at her primary care provider 10 days ago for same.  Recent EF of 70-75% and a recent diagnosis of HFpEF.  Patient was started on furosemide however it does not appear that patient is currently taking any diuretic.  She also report prior history of DVT.  Review of her previous note it appears patient has had claudication status post bypass grafting of the right lower extremity.  Since her leg swelling appears to involve right greater than left, will obtain a DVT study.    9:09 PM Patient has normal BNP.  Mild AKI with creatinine of 1.71, similar to prior values.  Leukocytosis with WBC 101.7 similar to prior and likely related to her CLL.  DVT study of bilateral lower extremities without any evidence of DVT.  Chest x-ray shows very mild diffuse increased interstitial lung markings which may be chronic in nature.  EKG unremarkable.  I discussed care with Dr. Melina Copa, felt patient may benefit from antibiotic for potential underlying  cellulitis causing leg pain and swelling.  She will need to continue with her diuretic and follow-up closely with PCP for further management.  Return precaution discussed.  Recommend compressive stocking and leg elevation to help decrease with swelling.   Domenic Moras, PA-C 06/19/20 2117    Hayden Rasmussen, MD 06/20/20 [phone]

## 2020-06-19 NOTE — ED Notes (Signed)
Patient verbalizes understanding of discharge instructions. Opportunity for questioning and answers were provided. Armband removed by staff, pt discharged from ED by wheelchair   

## 2020-06-19 NOTE — ED Triage Notes (Signed)
Pt reports bilateral foot and leg swelling for several days. Has mild sob, denies cp.

## 2020-06-19 NOTE — ED Notes (Signed)
Informed Adam, resident of critical lab WBC 101.7

## 2020-06-20 ENCOUNTER — Telehealth: Payer: Self-pay | Admitting: Neurology

## 2020-06-20 ENCOUNTER — Ambulatory Visit: Payer: Medicare Other | Admitting: Neurology

## 2020-06-20 NOTE — Telephone Encounter (Signed)
Pt was a no show to new consult apt today

## 2020-06-21 ENCOUNTER — Other Ambulatory Visit: Payer: Self-pay

## 2020-06-21 ENCOUNTER — Emergency Department (HOSPITAL_COMMUNITY)
Admission: EM | Admit: 2020-06-21 | Discharge: 2020-06-21 | Disposition: A | Discharge status: Home / Self Care | Payer: Medicare Other | Attending: Emergency Medicine | Admitting: Emergency Medicine

## 2020-06-21 DIAGNOSIS — E1122 Type 2 diabetes mellitus with diabetic chronic kidney disease: Secondary | ICD-10-CM | POA: Insufficient documentation

## 2020-06-21 DIAGNOSIS — R2240 Localized swelling, mass and lump, unspecified lower limb: Secondary | ICD-10-CM | POA: Diagnosis present

## 2020-06-21 DIAGNOSIS — Z7984 Long term (current) use of oral hypoglycemic drugs: Secondary | ICD-10-CM | POA: Insufficient documentation

## 2020-06-21 DIAGNOSIS — N183 Chronic kidney disease, stage 3 unspecified: Secondary | ICD-10-CM | POA: Diagnosis not present

## 2020-06-21 DIAGNOSIS — J45909 Unspecified asthma, uncomplicated: Secondary | ICD-10-CM | POA: Insufficient documentation

## 2020-06-21 DIAGNOSIS — M79671 Pain in right foot: Secondary | ICD-10-CM | POA: Diagnosis not present

## 2020-06-21 DIAGNOSIS — Z79899 Other long term (current) drug therapy: Secondary | ICD-10-CM | POA: Insufficient documentation

## 2020-06-21 DIAGNOSIS — J449 Chronic obstructive pulmonary disease, unspecified: Secondary | ICD-10-CM | POA: Diagnosis not present

## 2020-06-21 DIAGNOSIS — I129 Hypertensive chronic kidney disease with stage 1 through stage 4 chronic kidney disease, or unspecified chronic kidney disease: Secondary | ICD-10-CM | POA: Diagnosis not present

## 2020-06-21 DIAGNOSIS — E1151 Type 2 diabetes mellitus with diabetic peripheral angiopathy without gangrene: Secondary | ICD-10-CM | POA: Insufficient documentation

## 2020-06-21 DIAGNOSIS — M79672 Pain in left foot: Secondary | ICD-10-CM | POA: Diagnosis not present

## 2020-06-21 DIAGNOSIS — F17210 Nicotine dependence, cigarettes, uncomplicated: Secondary | ICD-10-CM | POA: Diagnosis not present

## 2020-06-21 LAB — BASIC METABOLIC PANEL
Anion gap: 10 (ref 5–15)
BUN: 18 mg/dL (ref 8–23)
CO2: 29 mmol/L (ref 22–32)
Calcium: 8.9 mg/dL (ref 8.9–10.3)
Chloride: 103 mmol/L (ref 98–111)
Creatinine, Ser: 1.61 mg/dL — ABNORMAL HIGH (ref 0.44–1.00)
GFR calc Af Amer: 37 mL/min — ABNORMAL LOW (ref >60)
GFR calc non Af Amer: 32 mL/min — ABNORMAL LOW (ref >60)
Glucose, Bld: 100 mg/dL — ABNORMAL HIGH (ref 70–99)
Potassium: 5.1 mmol/L (ref 3.5–5.1)
Sodium: 142 mmol/L (ref 135–145)

## 2020-06-21 LAB — CBC WITH DIFFERENTIAL/PLATELET
Abs Immature Granulocytes: 0.36 10*3/uL — ABNORMAL HIGH (ref 0.00–0.07)
Basophils Absolute: 0.3 10*3/uL — ABNORMAL HIGH (ref 0.0–0.1)
Basophils Relative: 0 %
Eosinophils Absolute: 0.2 10*3/uL (ref 0.0–0.5)
Eosinophils Relative: 0 %
HCT: 37.8 % (ref 36.0–46.0)
Hemoglobin: 11.3 g/dL — ABNORMAL LOW (ref 12.0–15.0)
Immature Granulocytes: 0 %
Lymphocytes Relative: 74 %
Lymphs Abs: 76.2 10*3/uL — ABNORMAL HIGH (ref 0.7–4.0)
MCH: 27.3 pg (ref 26.0–34.0)
MCHC: 29.9 g/dL — ABNORMAL LOW (ref 30.0–36.0)
MCV: 91.3 fL (ref 80.0–100.0)
Monocytes Absolute: 19.5 10*3/uL — ABNORMAL HIGH (ref 0.1–1.0)
Monocytes Relative: 19 %
Neutro Abs: 7.4 10*3/uL (ref 1.7–7.7)
Neutrophils Relative %: 7 %
Platelets: 236 10*3/uL (ref 150–400)
RBC: 4.14 MIL/uL (ref 3.87–5.11)
RDW: 18 % — ABNORMAL HIGH (ref 11.5–15.5)
WBC Morphology: ABNORMAL
WBC: 103.9 10*3/uL (ref 4.0–10.5)
nRBC: 0.1 % (ref 0.0–0.2)

## 2020-06-21 LAB — PATHOLOGIST SMEAR REVIEW

## 2020-06-21 MED ORDER — HYDROCODONE-ACETAMINOPHEN 5-325 MG PO TABS: 1.0000 | ORAL_TABLET | ORAL | 0 refills | Status: DC | PRN

## 2020-06-21 MED ORDER — HYDROCODONE-ACETAMINOPHEN 5-325 MG PO TABS
1.0000 | ORAL_TABLET | Freq: Once | ORAL | Status: AC
  Administered 2020-06-21: 1 via ORAL
  Filled 2020-06-21: qty 1

## 2020-06-21 NOTE — ED Provider Notes (Signed)
[facility] DEPT Provider Note   CSN: [id number] Arrival date & time: 06/21/20  1018     History Chief Complaint  Patient presents with  . Leg Swelling    [name] [name] is a 74 y.o. female.  HPI She is here for evaluation of bilateral foot pain which is ongoing for 1 month, and has been present since she was taken off diuretic medication because of renal insufficiency.  She was in the ED several days ago at that time treated symptomatically. She states that her primary problem is  Foot pain that has not improved since being treated with gabapentin for it.  No recent trauma to the feet.  She denies chest pain or shortness of breath at this time.  No recent fever, chills, vomiting or dizziness.  There are no other known modifying factors.    Past Medical History:  Diagnosis Date  . Allergy    environmental  . Asthma   . CLL (chronic lymphoblastic leukemia)   . Constipation 11/15/2011  . COPD (chronic obstructive pulmonary disease) ([place])   . Diabetes mellitus without complication ([place])   . GERD (gastroesophageal reflux disease)   . Hyperlipidemia   . Hypertension   . PVD (peripheral vascular disease) ([place])   . Stroke [facility]) 1998   in 1998 due to tumor-right side    Patient Active Problem List   Diagnosis Date Noted  . (HFpEF) heart failure with preserved ejection fraction ([place]) 06/11/2020  . Muscle spasm, nocturnal 03/07/2020  . Bilateral lower extremity edema 06/22/2019  . Pulmonary nodules 01/04/2018  . Mass of left lower leg 01/04/2018  . Chronic kidney disease (CKD), stage III (moderate) ([place]) 06/04/2016  . Chronic back pain 10/02/2015  . DM (diabetes mellitus), type 2 with peripheral vascular complications ([place]) 86/76/1949  . Chronic lymphocytic leukemia ([place]) 04/11/2009  . PERIPHERAL VASCULAR DISEASE 10/13/2007  . HYPERCHOLESTEROLEMIA 02/23/2007  . ANXIETY 02/23/2007  . TOBACCO DEPENDENCE 02/23/2007  . Essential hypertension  02/23/2007  . COPD (chronic obstructive pulmonary disease) ([place]) 02/23/2007  . GASTROESOPHAGEAL REFLUX, NO ESOPHAGITIS 02/23/2007    Past Surgical History:  Procedure Laterality Date  . ABI  02/2012   ABI <0.65 BL 02/2012  . BRAIN MENINGIOMA EXCISION    . Bypass grafting of RLE for PAD claudication     . CESAREAN SECTION     1974, 78 ,80  . CHOLECYSTECTOMY, LAPAROSCOPIC    . COLONOSCOPY  2014   [place], [place]  . ESOPHAGEAL DILATION       OB History   No obstetric history on file.     Family History  Problem Relation Age of Onset  . Heart disease Mother   . Asthma Mother   . Cancer Mother        uterine   . Depression Mother   . Heart attack Mother 43  . Hyperlipidemia Mother   . Hypertension Mother   . Stroke Mother   . Kidney disease Mother   . Heart attack Sister   . Stroke Sister   . Depression Sister   . Diabetes Sister   . Hyperlipidemia Sister   . Depression Sister   . Diabetes Sister   . Hyperlipidemia Sister   . HIV/AIDS Sister   . Diabetes Brother   . Asthma Brother   . Hypertension Brother   . Cancer Maternal Aunt        lung  . Heart disease Maternal Grandmother   . Heart attack Maternal Grandmother   .  Cancer Brother        colon  . HIV/AIDS Brother   . Colon cancer Neg Hx     Social History   Tobacco Use  . Smoking status: Current Some Day Smoker    Packs/day: 0.25    Years: 30.00    Pack years: 7.50    Types: Cigarettes    Start date: 01/01/1976  . Smokeless tobacco: Never Used  . 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 Vaping Use: Never used  Substance Use Topics  . Alcohol use: No    Alcohol/week: 0.0 standard drinks  . Drug use: No    Home Medications Prior to Admission medications   Medication Sig Start Date End Date Taking? Authorizing Provider  albuterol (ACCUNEB) 0.63 MG/3ML nebulizer solution Take 3 mLs (0.63 mg total) by nebulization every 6 (six) hours as needed for wheezing. 08/24/19   Shirley, [place],  DO  albuterol (VENTOLIN HFA) 108 (90 Base) MCG/ACT inhaler INHALE 2 PUFFS BY MOUTH EVERY 6 HOURS AS NEEDED FOR WHEEZING OR SHORTNESS OF BREATH 01/11/20   Shirley, [place], DO  Blood Glucose Monitoring Suppl (ONETOUCH VERIO) w/Device KIT 1 Device by Does not apply route daily. 04/26/18   Smiley Houseman, MD  BREO ELLIPTA 100-25 MCG/INH AEPB Inhale 1 puff by mouth every day Patient taking differently: Inhale 1 puff into the lungs daily.  05/09/20   Shirley, [place], DO  cephALEXin (KEFLEX) 500 MG capsule Take 1 capsule (500 mg total) by mouth 4 (four) times daily. 06/19/20   Domenic Moras, PA-C  clopidogrel (PLAVIX) 75 MG tablet Take 1 tablet by mouth every day 01/23/20   Shirley, [place], DO  cyclobenzaprine (FLEXERIL) 10 MG tablet Take 1 tablet by mouth 3 times a day as needed for muscle spasm Patient taking differently: Take 10 mg by mouth 3 (three) times daily as needed for muscle spasms. Take 1 tablet by mouth 3 times a day as needed for muscle spasm 05/15/20   Shirley, [place], DO  diclofenac sodium (VOLTAREN) 1 % GEL Apply 2 g topically 4 (four) times daily. Patient not taking: Reported on 06/19/2020 06/20/19   Shirley, [place], DO  diclofenac Sodium (VOLTAREN) 1 % GEL Apply 2 grams topically four times a day Patient taking differently: Apply 2 g topically 4 (four) times daily.  04/09/20   Shirley, [place], DO  DULoxetine (CYMBALTA) 30 MG capsule TAKE ONE CAPSULE BY MOUTH EVERY DAY 04/09/20   Shirley, [place], DO  empagliflozin (JARDIANCE) 10 MG TABS tablet Take 10 mg by mouth daily. 01/11/20   Shirley, [place], DO  famotidine (PEPCID) 20 MG tablet Take 2 tablets by mouth every day 04/28/20   Shirley, [place], DO  Fluticasone-Salmeterol [facility] INHUB) 250-50 MCG/DOSE AEPB Inhale 1 puff into the lungs 2 (two) times daily. 01/11/20   Shirley, [place], DO  furosemide (LASIX) 20 MG tablet Take 1 tablet (20 mg total) by mouth daily. 06/09/20   Shirley, [place], DO  gabapentin (NEURONTIN) 300 MG capsule Take 1 capsule (300  mg total) by mouth 2 (two) times daily. 05/15/20   Shirley, [place], DO  glucose blood [facility] VERIO) test strip Use to check blood sugar once a day 04/26/18   Smiley Houseman, MD  hydrALAZINE (APRESOLINE) 50 MG tablet Take 50 mg by mouth 3 (three) times daily. 04/11/20   [provider]  Lancets Glory Rosebush ULTRASOFT) lancets Use to check blood sugar once a day 04/26/18   Smiley Houseman, MD  Lido-Capsaicin-Men-Methyl Sal 0.5-0.035-5-20 % [facility]  Apply 1 patch topically daily. As needed Patient not taking: Reported on 06/19/2020 09/19/19   Shirley, [place], DO  metFORMIN (GLUCOPHAGE) 500 MG tablet Take 1 tablet by mouth every day with breakfast. 01/11/20   Shirley, [place], DO  montelukast (SINGULAIR) 10 MG tablet Take 1 tablet by mouth at bedtime 04/28/20   Shirley, [place], DO  nitroGLYCERIN (NITROSTAT) 0.4 MG SL tablet Dissolve 1 tablet under tongue every 5 minutes, up to 3 doses for chest pain 01/11/20   Shirley, [place], DO  simvastatin (ZOCOR) 20 MG tablet Take 1 tablet by mouth at bedtime 04/28/20   Shirley, [place], DO  traMADol (ULTRAM) 50 MG tablet Take 1 tablet by mouth every day as needed for moderate pain Patient taking differently: Take 50 mg by mouth every 6 (six) hours as needed for moderate pain or severe pain.  03/11/20   Shirley, [place], DO  baclofen (LIORESAL) 10 MG tablet Take 1 tablet (10 mg total) by mouth daily at 6 PM. 08/24/19   Shirley, [place], DO  cyclobenzaprine (FLEXERIL) 5 MG tablet Take 1 tablet (5 mg total) by mouth 3 (three) times daily as needed for muscle spasms. 09/19/19   Shirley, [place], DO  famotidine (PEPCID) 20 MG tablet Take 2 tablets (40 mg total) by mouth daily. 06/20/19   Shirley, [place], DO  metFORMIN (GLUCOPHAGE) 500 MG tablet Take 1 tablet (500 mg total) by mouth daily with breakfast. Patient taking differently: Take 500 mg by mouth 2 (two) times daily with a meal.  01/10/19   Enid Derry, [place], DO  metoprolol tartrate (LOPRESSOR) 25 MG tablet TAKE ONE HALF  TABLET BY MOUTH TWICE DAILY 08/24/18   Shirley, [place], DO  nitroGLYCERIN (NITROSTAT) 0.4 MG SL tablet DISSOLVE ONE TABLET UNDER THE TONGUE EVERY 5 MINUTES, UP TO 3 DOSES FOR CHEST PAIN 10/26/18   Shirley, [place], DO  PROAIR HFA 108 [phone] Base) MCG/ACT inhaler INHALE 2 PUFFS BY MOUTH EVERY 6 HOURS AS NEEDED FOR WHEEZING OR SHORTNESS OF BREATH 11/27/18   Enid Derry, [place], DO  simvastatin (ZOCOR) 20 MG tablet TAKE ONE TABLET BY MOUTH AT BEDTIME 08/24/18   Shirley, [place], DO    Allergies    Aspirin and Baclofen  Review of Systems   Review of Systems  All other systems reviewed and are negative.   Physical Exam Updated Vital Signs BP (!) 179/84 (BP Location: Left Arm)   Pulse 81   Temp 98.4 F (36.9 C) (Oral)   Resp 18   Ht '5\' 2"'$  (1.575 m)   Wt 67.1 kg   SpO2 98%   BMI 27.07 kg/m   Physical Exam Vitals and nursing note reviewed.  Constitutional:      General: She is in acute distress (Crying secondary to foot pain).     Appearance: She is well-developed. She is not ill-appearing or diaphoretic.  HENT:     Head: Normocephalic and atraumatic.     Right Ear: External ear normal.     Left Ear: External ear normal.  Eyes:     Conjunctiva/sclera: Conjunctivae normal.     Pupils: Pupils are equal, round, and reactive to light.  Neck:     Trachea: Phonation normal.  Cardiovascular:     Rate and Rhythm: Normal rate and regular rhythm.     Heart sounds: Normal heart sounds.  Pulmonary:     Effort: Pulmonary effort is normal.     Breath sounds: Normal breath sounds.  Abdominal:     General: There is no distension.  Palpations: Abdomen is soft.     Tenderness: There is no abdominal tenderness.  Musculoskeletal:     Cervical back: Normal range of motion and neck supple.     Comments: Mild bilateral lower leg swelling and mild to moderate bilateral foot swelling.  Feet are exquisitely tender to touch.  No evidence for drainage, bleeding or fluctuance of the lower legs or feet.    Skin:    General: Skin is warm and dry.  Neurological:     Mental Status: She is alert and oriented to person, place, and time.     Cranial Nerves: No cranial nerve deficit.     Sensory: No sensory deficit.     Motor: No abnormal muscle tone.     Coordination: Coordination normal.  Psychiatric:        Mood and Affect: Mood normal.        Behavior: Behavior normal.        Thought Content: Thought content normal.        Judgment: Judgment normal.     ED Results / Procedures / Treatments   Labs (all labs ordered are listed, but only abnormal results are displayed) Labs Reviewed - No data to display  EKG None  Radiology DG Chest 2 View  Result Date: 06/19/2020 CLINICAL DATA:  Leg swelling x1 month. EXAM: CHEST - 2 VIEW COMPARISON:  March 18, 2019 FINDINGS: Very mild diffusely increased interstitial lung markings are seen. There is no evidence of focal consolidation, pleural effusion or pneumothorax. The heart size and mediastinal contours are within normal limits. The visualized skeletal structures are unremarkable. IMPRESSION: Very mild diffusely increased interstitial lung markings, which may be chronic in nature. Electronically Signed   By: Virgina Norfolk M.D.   On: 06/19/2020 16:09   VAS [place] LOWER EXTREMITY VENOUS (DVT) (ONLY [facility] & [facility] 7a-7p)  Result Date: 06/19/2020  Lower Venous DVTStudy Indications: Pain, and Edema.  Comparison Study: 06/21/19 negative Performing Technologist: June Leap RDMS, RVT  Examination Guidelines: A complete evaluation includes B-mode imaging, spectral Doppler, color Doppler, and power Doppler as needed of all accessible portions of each vessel. Bilateral testing is considered an integral part of a complete examination. Limited examinations for reoccurring indications may be performed as noted. The reflux portion of the exam is performed with the patient in reverse Trendelenburg.  +---------+---------------+---------+-----------+----------+--------------+  RIGHT    CompressibilityPhasicitySpontaneityPropertiesThrombus Aging +---------+---------------+---------+-----------+----------+--------------+ CFV      Full           Yes      Yes                                 +---------+---------------+---------+-----------+----------+--------------+ SFJ      Full                                                        +---------+---------------+---------+-----------+----------+--------------+ FV Prox  Full                                                        +---------+---------------+---------+-----------+----------+--------------+ FV Mid   Full                                                        +---------+---------------+---------+-----------+----------+--------------+  FV DistalFull                                                        +---------+---------------+---------+-----------+----------+--------------+ PFV      Full                                                        +---------+---------------+---------+-----------+----------+--------------+ POP      Full           Yes      Yes                                 +---------+---------------+---------+-----------+----------+--------------+ PTV      Full                                                        +---------+---------------+---------+-----------+----------+--------------+ PERO     Full                                                        +---------+---------------+---------+-----------+----------+--------------+   +---------+---------------+---------+-----------+----------+--------------+ LEFT     CompressibilityPhasicitySpontaneityPropertiesThrombus Aging +---------+---------------+---------+-----------+----------+--------------+ CFV      Full           Yes      Yes                                 +---------+---------------+---------+-----------+----------+--------------+ SFJ      Full                                                         +---------+---------------+---------+-----------+----------+--------------+ FV Prox  Full                                                        +---------+---------------+---------+-----------+----------+--------------+ FV Mid   Full                                                        +---------+---------------+---------+-----------+----------+--------------+ FV DistalFull                                                        +---------+---------------+---------+-----------+----------+--------------+  PFV      Full                                                        +---------+---------------+---------+-----------+----------+--------------+ POP      Full           Yes      Yes                                 +---------+---------------+---------+-----------+----------+--------------+ PTV      Full                                                        +---------+---------------+---------+-----------+----------+--------------+ PERO     Full                                                        +---------+---------------+---------+-----------+----------+--------------+     Summary: BILATERAL: - No evidence of deep vein thrombosis seen in the lower extremities, bilaterally. -No evidence of popliteal cyst, bilaterally.   *See table(s) above for measurements and observations. Electronically signed by Lemar Livings MD on 06/19/2020 at 9:01:33 PM.    Final     Procedures Procedures (including critical care time)  Medications Ordered in ED Medications - No data to display  ED Course  I have reviewed the triage vital signs and the nursing notes.  Pertinent labs & imaging results that were available during my care of the patient were reviewed by me and considered in my medical decision making (see chart for details).  Clinical Course as of Jun 21 1629  Sat Jun 21, 2020  1629 Normal except glucose high, creatinine high, GFR low    Basic metabolic panel(!) [EW]  1629 Normal except like a high, MCHC low, RDW high, lymphocytes high, monocytes high  CBC with Differential(!!) [EW]    Clinical Course User Index [EW] Mancel Bale, MD   MDM Rules/Calculators/A&P                           Patient Vitals for the past 24 hrs:  BP Temp Temp src Pulse Resp SpO2 Height Weight  06/21/20 1303 (!) 186/81 -- -- 80 18 98 % -- --  06/21/20 1034 -- -- -- -- -- -- 5\' 2"  (1.575 m) 67.1 kg  06/21/20 1033 (!) 179/84 98.4 F (36.9 C) Oral 81 18 98 % -- --    4:50 PM Reevaluation with update and discussion. After initial assessment and treatment, an updated evaluation reveals she is more comfortable now after narcotic medicine.  Findings discussed with the patient and all questions were answered.06/23/20   Medical Decision Making:  This patient is presenting for evaluation of bilateral foot pain with lower leg swelling, which does require a range of treatment options, and is a complaint that involves a moderate risk of morbidity and mortality. The differential diagnoses include peripheral edema, arthritis,  gout, nonspecific leg swelling. I decided to review old records, and in summary patient with chronic lower extremity swelling and history of diastolic cardiac dysfunction.  No recent trauma.  I obtained additional historical information from her husband at the bedside.  He states that her pain is worse at night..  Clinical Laboratory Tests Ordered, included CBC and Metabolic panel. Review indicates ongoing chronic mild elevation of creatinine.  Also marked elevation of white count, consistent with her prior diagnosis of CLL.    Critical Interventions-clinical evaluation, laboratory testing, observation, narcotic pain reliever, reassessment  After These Interventions, the Patient was reevaluated and was found stable chronic lower leg edema and foot pain.  Doubt acute infectious process, change in chronic kidney disease or  acute congestive heart failure  CRITICAL CARE-no Performed by: Daleen Bo  Nursing Notes Reviewed/ Care Coordinated Applicable Imaging Reviewed Interpretation of Laboratory Data incorporated into ED treatment  The patient appears reasonably screened and/or stabilized for discharge and I doubt any other medical condition or other [facility] requiring further screening, evaluation, or treatment in the ED at this time prior to discharge.  Plan: Home Medications-continue current medication; Home Treatments-elevate legs as much as possible and use compression stockings to decrease edema, stay on low-salt diet; return here if the recommended treatment, does not improve the symptoms; Recommended follow up-PCP follow-up 1 week and as needed     Final Clinical Impression(s) / ED Diagnoses Final diagnoses:  None    Rx / DC Orders ED Discharge Orders    None       Daleen Bo, MD 06/21/20 [phone]

## 2020-06-21 NOTE — ED Notes (Signed)
ED Provider at bedside. 

## 2020-06-21 NOTE — ED Triage Notes (Signed)
Patient reports bilateral leg swelling for a month. Patient says she is in pain rated 1/10. [facility] and all they did was check her heart.

## 2020-06-21 NOTE — Discharge Instructions (Addendum)
We are prescribing a short course of narcotic pain medicine to treat your foot discomfort.  It is important to elevate your feet above your heart as much as possible.  Make sure you stay on low-salt diet, to decrease swelling in your legs.  Try to wear compression stockings to decrease your foot pain.

## 2020-06-21 NOTE — ED Notes (Signed)
Patient assisted to restroom using steady. °

## 2020-06-23 ENCOUNTER — Telehealth: Payer: Self-pay

## 2020-06-23 NOTE — Assessment & Plan Note (Signed)
Patient with recent echo and lower extremity edema that would diagnose patient with HFpEF.  Patient given Lasix to take for her leg swelling as needed.  She is continuing to follow-up with nephrology.  Patient not yet established with cardiology.  Referral placed today for patient to establish care with cardiology given her new diagnosis of HFpEF.  Patient with history of significant vascular disease and is also following up with vascular surgeon for PAD.  Patient continues to smoke despite multiple efforts in order to aid her in smoking cessation.  Patient currently with no chest pain or shortness of breath.  Return precautions discussed patient voiced understanding.

## 2020-06-23 NOTE — Telephone Encounter (Signed)
Patient calls nurse line requesting an apt with PCP. PCP has nothing the next two days, and then nothing after. Patient reports she was seen in ED for bilateral foot pain. Patient states she was given pain medication in ED, however has run out as of today. I advised patient I can put her with another provider or new PCP, however she really would like to speak with Dr.Shirley one last time. Please advise.

## 2020-06-23 NOTE — Telephone Encounter (Signed)
Pts daughter called to report pt was seen recently at [facility] [place] and needs pain medication for legs. Pt last seen 05/08/18 and scheduled to f/u on 08/04/20. Advised unable to prescribe any medications without pt being seen. Advised if not contraindicated, may take tylenol ES 2 tabs every 4-6 hours for pain, elevate legs, wear compression hose and take antibiotics as prescribed. Voiced understanding. Dagoberto Ligas, PA-C agreed with recommendations.

## 2020-06-24 ENCOUNTER — Other Ambulatory Visit: Payer: Self-pay | Admitting: Family Medicine

## 2020-06-25 ENCOUNTER — Other Ambulatory Visit: Payer: Self-pay | Admitting: Family Medicine

## 2020-06-25 DIAGNOSIS — G8929 Other chronic pain: Secondary | ICD-10-CM

## 2020-06-25 NOTE — Telephone Encounter (Signed)
Called to discuss patient's pain. Will place pain management referral given her chronic pain and she is encouraged to follow-up with nephrology, vascular and orthopedics as previously scheduled.

## 2020-06-28 ENCOUNTER — Other Ambulatory Visit: Payer: Self-pay | Admitting: Family Medicine

## 2020-07-02 ENCOUNTER — Other Ambulatory Visit: Payer: Self-pay | Admitting: Family Medicine

## 2020-07-10 ENCOUNTER — Encounter: Payer: Self-pay | Admitting: Family Medicine

## 2020-07-10 ENCOUNTER — Ambulatory Visit (INDEPENDENT_AMBULATORY_CARE_PROVIDER_SITE_OTHER): Payer: Medicare Other | Admitting: Family Medicine

## 2020-07-10 ENCOUNTER — Other Ambulatory Visit: Payer: Self-pay

## 2020-07-10 VITALS — BP 112/62 | HR 85 | Wt 138.2 lb

## 2020-07-10 DIAGNOSIS — I872 Venous insufficiency (chronic) (peripheral): Secondary | ICD-10-CM | POA: Diagnosis not present

## 2020-07-10 DIAGNOSIS — C9110 Chronic lymphocytic leukemia of B-cell type not having achieved remission: Secondary | ICD-10-CM

## 2020-07-10 DIAGNOSIS — I10 Essential (primary) hypertension: Secondary | ICD-10-CM | POA: Diagnosis not present

## 2020-07-10 DIAGNOSIS — E7800 Pure hypercholesterolemia, unspecified: Secondary | ICD-10-CM

## 2020-07-10 DIAGNOSIS — I739 Peripheral vascular disease, unspecified: Secondary | ICD-10-CM

## 2020-07-10 DIAGNOSIS — E1151 Type 2 diabetes mellitus with diabetic peripheral angiopathy without gangrene: Secondary | ICD-10-CM

## 2020-07-10 DIAGNOSIS — I5032 Chronic diastolic (congestive) heart failure: Secondary | ICD-10-CM | POA: Diagnosis not present

## 2020-07-10 LAB — POCT GLYCOSYLATED HEMOGLOBIN (HGB A1C): HbA1c, POC (controlled diabetic range): 6.7 % (ref 0.0–7.0)

## 2020-07-10 LAB — POCT URINALYSIS DIP (MANUAL ENTRY)
Bilirubin, UA: NEGATIVE
Blood, UA: NEGATIVE
Glucose, UA: 500 mg/dL — AB
Ketones, POC UA: NEGATIVE mg/dL
Nitrite, UA: NEGATIVE
Protein Ur, POC: NEGATIVE mg/dL
Spec Grav, UA: 1.015 (ref 1.010–1.025)
Urobilinogen, UA: 0.2 E.U./dL
pH, UA: 5 (ref 5.0–8.0)

## 2020-07-10 NOTE — Progress Notes (Signed)
Subjective:   Patient ID: [name] [name]    DOB: Oct 28, 1946, 74 y.o. female   MRN: [id number]  [name] [name] is a 74 y.o. female with a history of HFpEF, HTN, T2DM, peripheral vascular disease, COPD, GERD, CKD III, anxiety, bilateral LE edema, chronic back pain, CLL, hypercholesterolemia, tobacco dependence, pulmonary nodules here to meet PCP  Diabetes: Last three A1C's below. Currently on Metformin 500mg  BID, Jardiance 10mg  QD. Endorses compliance. Denies any polyuria, polydipsia, polyphagia. Due for diabetic eye exam.  Lab Results  Component Value Date   HGBA1C 6.7 07/10/2020   HGBA1C 6.8 04/24/2020   HGBA1C 7.0 01/09/2020    HTN:  BP: 112/62 today. Currently on Hydralazine 50mg  TID. Endorses compliance. Current everyday smoker. Denies any chest pain, SOB, vision changes, or headaches.   HLD: Last lipid panel below. Currently on Simvastatin 20mg  QD. Endorses compliance. Denies any muscles aches or weakness.  Lab Results  Component Value Date   CHOL 163 03/03/2020   HDL 39 (L) 03/03/2020   LDLCALC 83 03/03/2020   LDLDIRECT 62 09/24/2010   TRIG 250 (H) 03/03/2020   CHOLHDL 4.2 03/03/2020   Health Maintenance: Due for COVID vaccine, TDAP, and diabetic eye exam  Bilateral Ankle Swelling: Patient noting persistent bilateral ankle swelling, R>L. She notes minimal improvement with furosemide. She notes that she does notice improvement in her swelling with elevation of her LE's and first thing in the morning. She does not wear compression socks. She endorses pain with the swelling. She notes the swelling occurred after stopping the HCTZ which was discontinued by renal. Denies any trauma.   Review of Systems:  Per HPI.   Objective:   BP 112/62   Pulse 85   Wt 138 lb 3.2 oz (62.7 kg)   SpO2 98%   BMI 25.28 kg/m  Vitals and nursing note reviewed.  General: pleasant elderly female, sitting comfortably in exam chair, well nourished, well developed, in no acute distress with  non-toxic appearance HEENT: normocephalic, atraumatic, moist mucous membranes Neck: supple, normal ROM CV: regular rate and rhythm without murmurs, rubs, or gallops, trace to 1+ pitting edema localized to feet bilaterally (R>L) Lungs: clear to auscultation bilaterally with normal work of breathing on RA Abdomen: soft, non-tender, non-distended, normoactive bowel sounds Skin: warm, dry Extremities: warm and well perfused MSK: ROM of ankles and toes intact bilaterally, strength intact Neuro: Alert and oriented, speech normal  Assessment & Plan:   DM (diabetes mellitus), type 2 with peripheral vascular complications ([place]) Well controlled.  Continue Jardiance and Metformin as prescribed. Plans to schedule appointment with eye doctor  PERIPHERAL VASCULAR DISEASE Appointment with [facility] scheduled for 08/04/20 Follow up as scheduled  Essential hypertension Well controlled.  Continue Hydralazine 50mg  TID as prescribed. Follow up with nephrology as scheduled.  Venous insufficiency (chronic) (peripheral) Work up thus far has included heart echo that showed EF of 70-75%.  Moderate concentric LVH with elevated left ventricular end-diastolic pressure.  Mildly dilated right atria. Lungs clear on exam. Urinalysis without proteinuria. Stable CKD Stage III. Limited improvement with furosemide. Endorsed improvement with LE elevation. Appears most consistent with venous insufficiency.  - Recommend 20-30 mmHg compression stockings bilaterally daily - LE elevation as often as possible  - RTC if worsening  Chronic lymphocytic leukemia ([place]) Follow up scheduled for 08/01/20 with oncologist. Patient encouraged to follow up as scheduled.  HYPERCHOLESTEROLEMIA Continue Simvastatin 20mg  QD  (HFpEF) heart failure with preserved ejection fraction (HCC) Given endorsement of LE swelling and structural heart changes -  warrants diagnosis of HFpEF.  Currently scheduled for cardiology appointment on 08/07/20.    Currently stable. Will follow up any further recommendations  Orders Placed This Encounter  Procedures  . POCT glycosylated hemoglobin (Hb A1C)  . POCT urinalysis dipstick   No orders of the defined types were placed in this encounter.  Mina Marble, DO PGY-3, [facility] 07/14/2020 9:05 PM

## 2020-07-10 NOTE — Patient Instructions (Signed)
Thank you for coming to see me today. It was a pleasure to see you.   Please be sure to see your eye doctor for your diabetic eye exam.  See your local pharmacy for your tetanus vaccine.  Follow up with cardiology as scheduled on 08/07/20 and vascular surgery on 08/04/20.  We are checking some labs today, I will call you if they are abnormal will send you a MyChart message or a letter if they are normal.  If you do not hear about your labs in the next 2 weeks please let us know.  Please follow-up with me in 3 months for diabetes follow up.  If you have any questions or concerns, please do not hesitate to call the office at [phone].  Take Care,  Dr. Mina Marble, DO Resident Physician [place] [phone]

## 2020-07-14 ENCOUNTER — Encounter: Payer: Self-pay | Admitting: Family Medicine

## 2020-07-14 NOTE — Assessment & Plan Note (Signed)
Appointment with [facility] scheduled for 08/04/20 Follow up as scheduled

## 2020-07-14 NOTE — Assessment & Plan Note (Signed)
Well controlled.  Continue Hydralazine 50mg  TID as prescribed. Follow up with nephrology as scheduled.

## 2020-07-14 NOTE — Assessment & Plan Note (Signed)
Follow up scheduled for 08/01/20 with oncologist. Patient encouraged to follow up as scheduled.

## 2020-07-14 NOTE — Assessment & Plan Note (Signed)
Continue Simvastatin 20mg  QD

## 2020-07-14 NOTE — Assessment & Plan Note (Addendum)
Well controlled.  Continue Jardiance and Metformin as prescribed. Plans to schedule appointment with eye doctor

## 2020-07-14 NOTE — Assessment & Plan Note (Addendum)
Work up thus far has included heart echo that showed EF of 70-75%.  Moderate concentric LVH with elevated left ventricular end-diastolic pressure.  Mildly dilated right atria. Lungs clear on exam. Urinalysis without proteinuria. Stable CKD Stage III. Limited improvement with furosemide. Endorsed improvement with LE elevation. Appears most consistent with venous insufficiency.  - Recommend 20-30 mmHg compression stockings bilaterally daily - LE elevation as often as possible  - RTC if worsening

## 2020-07-14 NOTE — Assessment & Plan Note (Signed)
Given endorsement of LE swelling and structural heart changes - warrants diagnosis of HFpEF.  Currently scheduled for cardiology appointment on 08/07/20.  Currently stable. Will follow up any further recommendations

## 2020-07-18 ENCOUNTER — Encounter: Payer: Self-pay | Admitting: Physical Medicine and Rehabilitation

## 2020-07-21 DIAGNOSIS — R3129 Other microscopic hematuria: Secondary | ICD-10-CM | POA: Diagnosis not present

## 2020-07-21 DIAGNOSIS — I129 Hypertensive chronic kidney disease with stage 1 through stage 4 chronic kidney disease, or unspecified chronic kidney disease: Secondary | ICD-10-CM | POA: Diagnosis not present

## 2020-07-21 DIAGNOSIS — E1122 Type 2 diabetes mellitus with diabetic chronic kidney disease: Secondary | ICD-10-CM | POA: Diagnosis not present

## 2020-07-21 DIAGNOSIS — N1832 Chronic kidney disease, stage 3b: Secondary | ICD-10-CM | POA: Diagnosis not present

## 2020-07-21 DIAGNOSIS — E559 Vitamin D deficiency, unspecified: Secondary | ICD-10-CM | POA: Diagnosis not present

## 2020-07-22 ENCOUNTER — Other Ambulatory Visit: Payer: Self-pay

## 2020-07-22 DIAGNOSIS — R600 Localized edema: Secondary | ICD-10-CM

## 2020-08-01 ENCOUNTER — Other Ambulatory Visit: Payer: Self-pay | Admitting: Family Medicine

## 2020-08-01 ENCOUNTER — Ambulatory Visit: Payer: Medicare Other | Admitting: Hematology

## 2020-08-01 ENCOUNTER — Other Ambulatory Visit: Payer: Medicare Other

## 2020-08-01 ENCOUNTER — Ambulatory Visit: Payer: Medicare Other | Admitting: Cardiology

## 2020-08-04 ENCOUNTER — Telehealth: Payer: Self-pay | Admitting: Family Medicine

## 2020-08-04 ENCOUNTER — Ambulatory Visit (INDEPENDENT_AMBULATORY_CARE_PROVIDER_SITE_OTHER)
Admission: RE | Admit: 2020-08-04 | Discharge: 2020-08-04 | Disposition: A | Discharge status: Home / Self Care | Payer: Medicare Other | Source: Ambulatory Visit | Attending: Surgery | Admitting: Surgery

## 2020-08-04 ENCOUNTER — Ambulatory Visit (HOSPITAL_COMMUNITY)
Admission: RE | Admit: 2020-08-04 | Discharge: 2020-08-04 | Disposition: A | Discharge status: Home / Self Care | Payer: Medicare Other | Source: Ambulatory Visit | Attending: Surgery | Admitting: Surgery

## 2020-08-04 ENCOUNTER — Other Ambulatory Visit: Payer: Self-pay

## 2020-08-04 ENCOUNTER — Encounter: Payer: Self-pay | Admitting: Surgery

## 2020-08-04 ENCOUNTER — Ambulatory Visit: Payer: Medicare Other | Admitting: Surgery

## 2020-08-04 VITALS — BP 157/70 | HR 71 | Temp 97.8°F | Resp 20 | Ht 62.0 in | Wt 137.0 lb

## 2020-08-04 DIAGNOSIS — R600 Localized edema: Secondary | ICD-10-CM | POA: Diagnosis not present

## 2020-08-04 DIAGNOSIS — I70213 Atherosclerosis of native arteries of extremities with intermittent claudication, bilateral legs: Secondary | ICD-10-CM

## 2020-08-04 DIAGNOSIS — I739 Peripheral vascular disease, unspecified: Secondary | ICD-10-CM | POA: Insufficient documentation

## 2020-08-04 NOTE — Progress Notes (Signed)
[facility] [place]  Patient name: [name] [name] MRN: [id number] DOB: [birth year] Sex: female   REASON FOR VISIT:    Follow-up  HISOTRY OF PRESENT ILLNESS:    [name] [name] is a 74 y.o. female, who is back for follow up.  In 2009 I took her for angiogram for claudication and she was found to have an 18 cm right superficial femoral artery occlusion which was successfully recanalized and stented.  She then moved to [place].  She ultimately had a intervention in her left leg in [place] for claudication.  I last saw her in 2019.  At that time her symptoms were tolerable.  She had tried cilostazol but did not have any benefit.  She did not have any open wounds and so I elected to continue to monitor her.  She is back today with a several month history of worsening pain in her legs.  The right leg bothers her more than the left.  She can only walk short distances before she has to stop.  This is bordering on rest pain.  She is also worried about swelling in her right calf and ankle.  She has tried some type socks however this has not been of benefit.  She does not have any open wounds.  The patient suffers from hypercholesterolemia which is managed with a statin.  She is medically managed for hypertension.  She is a current smoker.  She does suffer from COPD.  PAST MEDICAL HISTORY:   Past Medical History:  Diagnosis Date  . Allergy    environmental  . Asthma   . CLL (chronic lymphoblastic leukemia)   . Constipation 11/15/2011  . COPD (chronic obstructive pulmonary disease) ([place])   . Diabetes mellitus without complication ([place])   . GERD (gastroesophageal reflux disease)   . Hyperlipidemia   . Hypertension   . PVD (peripheral vascular disease) ([place])   . Stroke ([place]) 1998   in 1998 due to tumor-right side     FAMILY HISTORY:   Family History  Problem Relation Age of Onset  . Heart disease Mother   . Asthma  Mother   . Cancer Mother        uterine   . Depression Mother   . Heart attack Mother 69  . Hyperlipidemia Mother   . Hypertension Mother   . Stroke Mother   . Kidney disease Mother   . Heart attack Sister   . Stroke Sister   . Depression Sister   . Diabetes Sister   . Hyperlipidemia Sister   . Depression Sister   . Diabetes Sister   . Hyperlipidemia Sister   . HIV/AIDS Sister   . Diabetes Brother   . Asthma Brother   . Hypertension Brother   . Cancer Maternal Aunt        lung  . Heart disease Maternal Grandmother   . Heart attack Maternal Grandmother   . Cancer Brother        colon  . HIV/AIDS Brother   . Colon cancer Neg Hx     SOCIAL HISTORY:   Social History   Tobacco Use  . Smoking status: Current Some Day Smoker    Packs/day: 0.25    Years: 30.00    Pack years: 7.50    Types: Cigarettes    Start date: 01/01/1976  . Smokeless tobacco: Never Used  . Tobacco comment: working on cutting back  Substance Use Topics  . Alcohol use: No  Alcohol/week: 0.0 standard drinks     ALLERGIES:   Allergies  Allergen Reactions  . No [employer] Food Allergies Diarrhea and Nausea And Vomiting    Cabbage, stomach pain   . Aspirin Other (See Comments)    irritates stomach  . Baclofen Other (See Comments)    Stomach irritation     CURRENT MEDICATIONS:   Current Outpatient Medications  Medication Sig Dispense Refill  . albuterol (ACCUNEB) 0.63 MG/3ML nebulizer solution Inhale 1 vial via nebulizer every 6 hours for wheezing as needed 75 mL 11  . albuterol (VENTOLIN HFA) 108 (90 Base) MCG/ACT inhaler INHALE 2 PUFFS BY MOUTH EVERY 6 HOURS AS NEEDED FOR WHEEZING OR SHORTNESS OF BREATH (Patient taking differently: Inhale 2 puffs into the lungs every 6 (six) hours as needed for wheezing or shortness of breath. ) 18 g 11  . Blood Glucose Monitoring Suppl (ONETOUCH VERIO) w/Device KIT 1 Device by Does not apply route daily. 1 kit 0  . BREO ELLIPTA 100-25 MCG/INH AEPB  Inhale 1 puff by mouth every day (Patient taking differently: Inhale 1 puff into the lungs daily. ) 60 each 11  . clopidogrel (PLAVIX) 75 MG tablet Take 1 tablet by mouth every day 30 tablet 11  . cyclobenzaprine (FLEXERIL) 10 MG tablet Take 1 tablet by mouth 3 times a day as needed for muscle spasm (Patient taking differently: Take 10 mg by mouth 3 (three) times daily as needed for muscle spasms. ) 30 tablet 11  . diclofenac sodium (VOLTAREN) 1 % GEL Apply 2 g topically 4 (four) times daily. (Patient taking differently: Apply 2 g topically 4 (four) times daily as needed (pain). ) 100 g 11  . DULoxetine (CYMBALTA) 30 MG capsule TAKE ONE CAPSULE BY MOUTH EVERY DAY (Patient taking differently: TAKE ONE CAPSULE BY MOUTH EVERY DAY) 30 capsule 11  . famotidine (PEPCID) 20 MG tablet Take 2 tablets by mouth every day 60 tablet 11  . furosemide (LASIX) 20 MG tablet Take 1 tablet (20 mg total) by mouth daily. 30 tablet 3  . gabapentin (NEURONTIN) 300 MG capsule Take 1 capsule (300 mg total) by mouth 2 (two) times daily. 60 capsule 3  . glucose blood (ONETOUCH VERIO) test strip Use to check blood sugar once a day 100 each 12  . hydrALAZINE (APRESOLINE) 50 MG tablet Take 50 mg by mouth 3 (three) times daily.    Marland Kitchen JARDIANCE 10 MG TABS tablet Take 1 tablet by mouth every day 30 tablet 2  . Lancets (ONETOUCH ULTRASOFT) lancets Use to check blood sugar once a day 100 each 12  . metFORMIN (GLUCOPHAGE) 500 MG tablet Take 1 tablet by mouth every day with breakfast. (Patient taking differently: Take 500 mg by mouth in the morning and at bedtime. ) 30 tablet 11  . montelukast (SINGULAIR) 10 MG tablet Take 1 tablet by mouth at bedtime 30 tablet 11  . nitroGLYCERIN (NITROSTAT) 0.4 MG SL tablet Dissolve 1 tablet under tongue every 5 minutes, up to 3 doses for chest pain 25 tablet 11  . simvastatin (ZOCOR) 20 MG tablet Take 1 tablet by mouth at bedtime 30 tablet 11  . traMADol (ULTRAM) 50 MG tablet Take 1 tablet by mouth  every day as needed for moderate pain (Patient taking differently: Take 50 mg by mouth every 6 (six) hours as needed for moderate pain or severe pain. ) 30 tablet 4   No current facility-administered medications for this visit.    REVIEW OF SYSTEMS:   _0   denotes positive finding, _0  denotes negative finding Cardiac  Comments:  Chest pain or chest pressure:    Shortness of breath upon exertion:    Short of breath when lying flat:    Irregular heart rhythm:        Vascular    Pain in calf, thigh, or hip brought on by ambulation: x   Pain in feet at night that wakes you up from your sleep:     Blood clot in your veins:    Leg swelling:  x       Pulmonary    Oxygen at home:    Productive cough:     Wheezing:         Neurologic    Sudden weakness in arms or legs:     Sudden numbness in arms or legs:     Sudden onset of difficulty speaking or slurred speech:    Temporary loss of vision in one eye:     Problems with dizziness:         Gastrointestinal    Blood in stool:     Vomited blood:         Genitourinary    Burning when urinating:     Blood in urine:        Psychiatric    Major depression:         Hematologic    Bleeding problems:    Problems with blood clotting too easily:        Skin    Rashes or ulcers:        Constitutional    Fever or chills:      PHYSICAL EXAM:   Vitals:   08/04/20 1430  BP: (!) 157/70  Pulse: 71  Resp: 20  Temp: 97.8 F (36.6 C)  SpO2: 97%  Weight: 137 lb (62.1 kg)  Height: _1  (1.575 m)    GENERAL: The patient is a well-nourished female, in no acute distress. The vital signs are documented above. CARDIAC: There is a regular rate and rhythm.  VASCULAR: Nonpalpable pedal pulses, trace edema right leg PULMONARY: Non-labored respirations.  MUSCULOSKELETAL: There are no major deformities or cyanosis. NEUROLOGIC: No focal weakness or paresthesias are detected. SKIN: There are no ulcers or rashes noted. PSYCHIATRIC: The  patient has a normal affect.  STUDIES:   I have reviewed her vascular studies with the following findings: Venous: No evidence of DVT in bilateral lower extremity  +-------+-----------+-----------+------------+------------+  ABI/TBIToday's ABIToday's TBIPrevious ABIPrevious TBI  +-------+-----------+-----------+------------+------------+  Right 0.55    0.32    0.68    0.5       +-------+-----------+-----------+------------+------------+  Left  0.56    0.47    0.91    0.62      +-------+-----------+-----------+------------+------------+  Right: 50-74% stenosis at the origin of the profunda artery. Occluded  stent with reconstitution in the popliteal artery.   Left: 70-99% stenosis in the CFA.  50-74% stenosis at the origin of the profunda artery.  Occluded graft with reconstitution in the popliteal artery.  MEDICAL ISSUES:   Right leg swelling: Venous ultrasound was negative for DVT.  She will likely need a venous reflux exam in the future however today her swelling is very minimal.  We have talked about the appropriate compression stocking for her to wear which I believe is 15-20.  She will try these and see how her symptoms do.  PAD: The patient has severe claudication bordering on rest pain.  She has bilateral SFA interventions  which are occluded by ultrasound today.  I recommended proceeding with arteriogram to define her anatomy.  Her right leg bothers her the most.  I will plan on cannulating the right groin performed bilateral runoff.  I will intervene on the left leg if indicated.  It looks like she had a bypass surgery in [place], I am not sure if she has any percutaneous options available.  Certainly with known stents on the right which I placed that are occluded she would not be a candidate for intervention redo on the right her procedure been scheduled for Tuesday, August 17.    Leia Alf, MD, FACS [facility] [phone] Pager [phone]

## 2020-08-04 NOTE — Telephone Encounter (Signed)
Pt submitted form for Medical Certification for Application & Renewal of Disability Parking Placard, last appt 0715/21; form placed in red team folder

## 2020-08-04 NOTE — H&P (View-Only) (Signed)
 [facility] [place]  Patient name: [name] [name] MRN: [id number] DOB: 09/05/1946 Sex: female   REASON FOR VISIT:    Follow-up  HISOTRY OF PRESENT ILLNESS:    [name] [name] is a 73 y.o. female, who is back for follow up.  In 2009 I took her for angiogram for claudication and she was found to have an 18 cm right superficial femoral artery occlusion which was successfully recanalized and stented.  She then moved to [place].  She ultimately had a intervention in her left leg in [place] for claudication.  I last saw her in 2019.  At that time her symptoms were tolerable.  She had tried cilostazol but did not have any benefit.  She did not have any open wounds and so I elected to continue to monitor her.  She is back today with a several month history of worsening pain in her legs.  The right leg bothers her more than the left.  She can only walk short distances before she has to stop.  This is bordering on rest pain.  She is also worried about swelling in her right calf and ankle.  She has tried some type socks however this has not been of benefit.  She does not have any open wounds.  The patient suffers from hypercholesterolemia which is managed with a statin.  She is medically managed for hypertension.  She is a current smoker.  She does suffer from COPD.  PAST MEDICAL HISTORY:   Past Medical History:  Diagnosis Date  . Allergy    environmental  . Asthma   . CLL (chronic lymphoblastic leukemia)   . Constipation 11/15/2011  . COPD (chronic obstructive pulmonary disease) (HCC)   . Diabetes mellitus without complication (HCC)   . GERD (gastroesophageal reflux disease)   . Hyperlipidemia   . Hypertension   . PVD (peripheral vascular disease) (HCC)   . Stroke (HCC) 1998   in 1998 due to tumor-right side     FAMILY HISTORY:   Family History  Problem Relation Age of Onset  . Heart disease Mother   . Asthma  Mother   . Cancer Mother        uterine   . Depression Mother   . Heart attack Mother 72  . Hyperlipidemia Mother   . Hypertension Mother   . Stroke Mother   . Kidney disease Mother   . Heart attack Sister   . Stroke Sister   . Depression Sister   . Diabetes Sister   . Hyperlipidemia Sister   . Depression Sister   . Diabetes Sister   . Hyperlipidemia Sister   . HIV/AIDS Sister   . Diabetes Brother   . Asthma Brother   . Hypertension Brother   . Cancer Maternal Aunt        lung  . Heart disease Maternal Grandmother   . Heart attack Maternal Grandmother   . Cancer Brother        colon  . HIV/AIDS Brother   . Colon cancer Neg Hx     SOCIAL HISTORY:   Social History   Tobacco Use  . Smoking status: Current Some Day Smoker    Packs/day: 0.25    Years: 30.00    Pack years: 7.50    Types: Cigarettes    Start date: 01/01/1976  . Smokeless tobacco: Never Used  . Tobacco comment: working on cutting back  Substance Use Topics  . Alcohol use: No      Alcohol/week: 0.0 standard drinks     ALLERGIES:   Allergies  Allergen Reactions  . No [employer] Food Allergies Diarrhea and Nausea And Vomiting    Cabbage, stomach pain   . Aspirin Other (See Comments)    irritates stomach  . Baclofen Other (See Comments)    Stomach irritation     CURRENT MEDICATIONS:   Current Outpatient Medications  Medication Sig Dispense Refill  . albuterol (ACCUNEB) 0.63 MG/3ML nebulizer solution Inhale 1 vial via nebulizer every 6 hours for wheezing as needed 75 mL 11  . albuterol (VENTOLIN HFA) 108 (90 Base) MCG/ACT inhaler INHALE 2 PUFFS BY MOUTH EVERY 6 HOURS AS NEEDED FOR WHEEZING OR SHORTNESS OF BREATH (Patient taking differently: Inhale 2 puffs into the lungs every 6 (six) hours as needed for wheezing or shortness of breath. ) 18 g 11  . Blood Glucose Monitoring Suppl (ONETOUCH VERIO) w/Device KIT 1 Device by Does not apply route daily. 1 kit 0  . BREO ELLIPTA 100-25 MCG/INH AEPB  Inhale 1 puff by mouth every day (Patient taking differently: Inhale 1 puff into the lungs daily. ) 60 each 11  . clopidogrel (PLAVIX) 75 MG tablet Take 1 tablet by mouth every day 30 tablet 11  . cyclobenzaprine (FLEXERIL) 10 MG tablet Take 1 tablet by mouth 3 times a day as needed for muscle spasm (Patient taking differently: Take 10 mg by mouth 3 (three) times daily as needed for muscle spasms. ) 30 tablet 11  . diclofenac sodium (VOLTAREN) 1 % GEL Apply 2 g topically 4 (four) times daily. (Patient taking differently: Apply 2 g topically 4 (four) times daily as needed (pain). ) 100 g 11  . DULoxetine (CYMBALTA) 30 MG capsule TAKE ONE CAPSULE BY MOUTH EVERY DAY (Patient taking differently: TAKE ONE CAPSULE BY MOUTH EVERY DAY) 30 capsule 11  . famotidine (PEPCID) 20 MG tablet Take 2 tablets by mouth every day 60 tablet 11  . furosemide (LASIX) 20 MG tablet Take 1 tablet (20 mg total) by mouth daily. 30 tablet 3  . gabapentin (NEURONTIN) 300 MG capsule Take 1 capsule (300 mg total) by mouth 2 (two) times daily. 60 capsule 3  . glucose blood (ONETOUCH VERIO) test strip Use to check blood sugar once a day 100 each 12  . hydrALAZINE (APRESOLINE) 50 MG tablet Take 50 mg by mouth 3 (three) times daily.    . JARDIANCE 10 MG TABS tablet Take 1 tablet by mouth every day 30 tablet 2  . Lancets (ONETOUCH ULTRASOFT) lancets Use to check blood sugar once a day 100 each 12  . metFORMIN (GLUCOPHAGE) 500 MG tablet Take 1 tablet by mouth every day with breakfast. (Patient taking differently: Take 500 mg by mouth in the morning and at bedtime. ) 30 tablet 11  . montelukast (SINGULAIR) 10 MG tablet Take 1 tablet by mouth at bedtime 30 tablet 11  . nitroGLYCERIN (NITROSTAT) 0.4 MG SL tablet Dissolve 1 tablet under tongue every 5 minutes, up to 3 doses for chest pain 25 tablet 11  . simvastatin (ZOCOR) 20 MG tablet Take 1 tablet by mouth at bedtime 30 tablet 11  . traMADol (ULTRAM) 50 MG tablet Take 1 tablet by mouth  every day as needed for moderate pain (Patient taking differently: Take 50 mg by mouth every 6 (six) hours as needed for moderate pain or severe pain. ) 30 tablet 4   No current facility-administered medications for this visit.    REVIEW OF SYSTEMS:   [X]   denotes positive finding, [ ] denotes negative finding Cardiac  Comments:  Chest pain or chest pressure:    Shortness of breath upon exertion:    Short of breath when lying flat:    Irregular heart rhythm:        Vascular    Pain in calf, thigh, or hip brought on by ambulation: x   Pain in feet at night that wakes you up from your sleep:     Blood clot in your veins:    Leg swelling:  x       Pulmonary    Oxygen at home:    Productive cough:     Wheezing:         Neurologic    Sudden weakness in arms or legs:     Sudden numbness in arms or legs:     Sudden onset of difficulty speaking or slurred speech:    Temporary loss of vision in one eye:     Problems with dizziness:         Gastrointestinal    Blood in stool:     Vomited blood:         Genitourinary    Burning when urinating:     Blood in urine:        Psychiatric    Major depression:         Hematologic    Bleeding problems:    Problems with blood clotting too easily:        Skin    Rashes or ulcers:        Constitutional    Fever or chills:      PHYSICAL EXAM:   Vitals:   08/04/20 1430  BP: (!) 157/70  Pulse: 71  Resp: 20  Temp: 97.8 F (36.6 C)  SpO2: 97%  Weight: 137 lb (62.1 kg)  Height: 5' 2" (1.575 m)    GENERAL: The patient is a well-nourished female, in no acute distress. The vital signs are documented above. CARDIAC: There is a regular rate and rhythm.  VASCULAR: Nonpalpable pedal pulses, trace edema right leg PULMONARY: Non-labored respirations.  MUSCULOSKELETAL: There are no major deformities or cyanosis. NEUROLOGIC: No focal weakness or paresthesias are detected. SKIN: There are no ulcers or rashes noted. PSYCHIATRIC: The  patient has a normal affect.  STUDIES:   I have reviewed her vascular studies with the following findings: Venous: No evidence of DVT in bilateral lower extremity  +-------+-----------+-----------+------------+------------+  ABI/TBIToday's ABIToday's TBIPrevious ABIPrevious TBI  +-------+-----------+-----------+------------+------------+  Right 0.55    0.32    0.68    0.5       +-------+-----------+-----------+------------+------------+  Left  0.56    0.47    0.91    0.62      +-------+-----------+-----------+------------+------------+  Right: 50-74% stenosis at the origin of the profunda artery. Occluded  stent with reconstitution in the popliteal artery.   Left: 70-99% stenosis in the CFA.  50-74% stenosis at the origin of the profunda artery.  Occluded graft with reconstitution in the popliteal artery.  MEDICAL ISSUES:   Right leg swelling: Venous ultrasound was negative for DVT.  She will likely need a venous reflux exam in the future however today her swelling is very minimal.  We have talked about the appropriate compression stocking for her to wear which I believe is 15-20.  She will try these and see how her symptoms do.  PAD: The patient has severe claudication bordering on rest pain.  She has bilateral SFA interventions   which are occluded by ultrasound today.  I recommended proceeding with arteriogram to define her anatomy.  Her right leg bothers her the most.  I will plan on cannulating the right groin performed bilateral runoff.  I will intervene on the left leg if indicated.  It looks like she had a bypass surgery in [place], I am not sure if she has any percutaneous options available.  Certainly with known stents on the right which I placed that are occluded she would not be a candidate for intervention redo on the right her procedure been scheduled for Tuesday, August 17.    Wells Adlene Adduci, IV, MD, FACS [facility] Tel [phone] Pager [phone] 

## 2020-08-05 ENCOUNTER — Inpatient Hospital Stay (HOSPITAL_BASED_OUTPATIENT_CLINIC_OR_DEPARTMENT_OTHER): Payer: Medicare Other | Admitting: Hematology

## 2020-08-05 ENCOUNTER — Other Ambulatory Visit: Payer: Self-pay

## 2020-08-05 ENCOUNTER — Inpatient Hospital Stay: Payer: Medicare Other | Attending: Hematology | Admitting: Internal Medicine

## 2020-08-05 VITALS — BP 133/64 | HR 79 | Temp 97.5°F | Resp 18 | Ht 62.0 in | Wt 136.8 lb

## 2020-08-05 DIAGNOSIS — C9110 Chronic lymphocytic leukemia of B-cell type not having achieved remission: Secondary | ICD-10-CM | POA: Diagnosis not present

## 2020-08-05 DIAGNOSIS — R918 Other nonspecific abnormal finding of lung field: Secondary | ICD-10-CM | POA: Insufficient documentation

## 2020-08-05 DIAGNOSIS — R296 Repeated falls: Secondary | ICD-10-CM | POA: Insufficient documentation

## 2020-08-05 DIAGNOSIS — F17210 Nicotine dependence, cigarettes, uncomplicated: Secondary | ICD-10-CM | POA: Diagnosis not present

## 2020-08-05 LAB — CMP (CANCER CENTER ONLY)
ALT: 10 U/L (ref 0–44)
AST: 15 U/L (ref 15–41)
Albumin: 3.8 g/dL (ref 3.5–5.0)
Alkaline Phosphatase: 207 U/L — ABNORMAL HIGH (ref 38–126)
Anion gap: 9 (ref 5–15)
BUN: 11 mg/dL (ref 8–23)
CO2: 26 mmol/L (ref 22–32)
Calcium: 9.9 mg/dL (ref 8.9–10.3)
Chloride: 108 mmol/L (ref 98–111)
Creatinine: 1.62 mg/dL — ABNORMAL HIGH (ref 0.44–1.00)
GFR, Est AFR Am: 36 mL/min — ABNORMAL LOW (ref >60)
GFR, Estimated: 31 mL/min — ABNORMAL LOW (ref >60)
Glucose, Bld: 163 mg/dL — ABNORMAL HIGH (ref 70–99)
Potassium: 3.9 mmol/L (ref 3.5–5.1)
Sodium: 143 mmol/L (ref 135–145)
Total Bilirubin: 0.3 mg/dL (ref 0.3–1.2)
Total Protein: 6.8 g/dL (ref 6.5–8.1)

## 2020-08-05 LAB — CBC WITH DIFFERENTIAL/PLATELET
Abs Immature Granulocytes: 0.38 10*3/uL — ABNORMAL HIGH (ref 0.00–0.07)
Basophils Absolute: 0.1 10*3/uL (ref 0.0–0.1)
Basophils Relative: 0 %
Eosinophils Absolute: 0 10*3/uL (ref 0.0–0.5)
Eosinophils Relative: 0 %
HCT: 38.6 % (ref 36.0–46.0)
Hemoglobin: 11.5 g/dL — ABNORMAL LOW (ref 12.0–15.0)
Immature Granulocytes: 0 %
Lymphocytes Relative: 78 %
Lymphs Abs: 91 10*3/uL — ABNORMAL HIGH (ref 0.7–4.0)
MCH: 26.6 pg (ref 26.0–34.0)
MCHC: 29.8 g/dL — ABNORMAL LOW (ref 30.0–36.0)
MCV: 89.4 fL (ref 80.0–100.0)
Monocytes Absolute: 17.2 10*3/uL — ABNORMAL HIGH (ref 0.1–1.0)
Monocytes Relative: 15 %
Neutro Abs: 8.3 10*3/uL — ABNORMAL HIGH (ref 1.7–7.7)
Neutrophils Relative %: 7 %
Platelets: 232 10*3/uL (ref 150–400)
RBC: 4.32 MIL/uL (ref 3.87–5.11)
RDW: 19.2 % — ABNORMAL HIGH (ref 11.5–15.5)
WBC: 117 10*3/uL (ref 4.0–10.5)
nRBC: 0 % (ref 0.0–0.2)

## 2020-08-05 LAB — LACTATE DEHYDROGENASE: LDH: 291 U/L — ABNORMAL HIGH (ref 98–192)

## 2020-08-05 NOTE — Telephone Encounter (Signed)
Reviewed form and placed in PCP's box for completion.  .Azekiel Cremer R Jenasis Straley, CMA  

## 2020-08-05 NOTE — Progress Notes (Signed)
HEMATOLOGY/ONCOLOGY CLINIC NOTE  Date of Service: 08/05/20    Patient Care Team: Danna Hefty, DO as PCP - General (Family Medicine)  CHIEF COMPLAINTS/PURPOSE OF CONSULTATION:  F/u for CLL   HISTORY OF PRESENTING ILLNESS:  [name] [name] is a wonderful 74 y.o. female smoker with history of CLL who has been referred to [place] by Dr Tarry Kos, Archie Endo, DO at [facility] for evaluation and management of elevated WBC/lymphocytes.   She is accompanied by her husband. She reported to ED on 11/27/2017 with cough and abdominal pain with COPD exacerbation. Her labs on this day showed WBC elevated at 62.5. She has not previously seen a hematologist. She reports a plan to quit smoking on the first day of the new year. She reports worsening decrease in balance and states she has a cane and walker that she uses as needed. She was taking Prednisone.  No weight loss/fevers/chills/night sweats/ palpable lumps or bumps.  On review of systems, pt reports back pain, change in balance, abdominal pain, weight loss and denies changes in BM, fever and any other accompanying symptoms.   INTERVAL HISTORY:  [name] [name] is here for management and evaluation of her Chronic Leukocytic Leukemia. The patient's last visit with [place] was on 04/01/2020. The pt reports that she is doing well overall.  The pt reports that she has had a lot of recent deaths in her family. This has impacted her overall mood. Pt has fallen a few times due to mishaps with her walker. She denies any significant injuries from these falls. Pt has otherwise felt well and has had no new symptoms.   Lab results today (08/05/20) of CBC w/diff and CMP is as follows: all values are WNL except for WBC at 117K, Hgb at 11.5, MCHC at 29.8, RDW at 19.2, Glucose at 163, Creatinine at 1.62, ALP at 207, GFR Est Af Am at 36. 08/05/2020 LDH at 291  On review of systems, pt reports dry cough and denies fevers, chills, night sweats, rash, new  lumps/bumps, abdominal pain, back pain and any other symptoms.   MEDICAL HISTORY:  Past Medical History:  Diagnosis Date  . Allergy    environmental  . Asthma   . CLL (chronic lymphoblastic leukemia)   . Constipation 11/15/2011  . COPD (chronic obstructive pulmonary disease) ([place])   . Diabetes mellitus without complication ([place])   . GERD (gastroesophageal reflux disease)   . Hyperlipidemia   . Hypertension   . PVD (peripheral vascular disease) ([place])   . Stroke [facility]) 1998   in 1998 due to tumor-right side    SURGICAL HISTORY: Past Surgical History:  Procedure Laterality Date  . ABI  02/2012   ABI <0.65 BL 02/2012  . BRAIN MENINGIOMA EXCISION    . Bypass grafting of RLE for PAD claudication     . CESAREAN SECTION     1974, 78 ,80  . CHOLECYSTECTOMY, LAPAROSCOPIC    . COLONOSCOPY  2014   [place], [place]  . ESOPHAGEAL DILATION      SOCIAL HISTORY: Social History   Socioeconomic History  . Marital status: Married    Spouse name: Not on file  . Number of children: 3  . Years of education: Not on file  . Highest education level: Not on file  Occupational History  . Occupation: DISABLED    Employer: DISABLED  Tobacco Use  . Smoking status: Current Some Day Smoker    Packs/day: 0.25    Years: 30.00  Pack years: 7.50    Types: Cigarettes    Start date: 01/01/1976  . Smokeless tobacco: Never Used  . 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 Vaping Use: Never used  Substance and Sexual Activity  . Alcohol use: No    Alcohol/week: 0.0 standard drinks  . Drug use: No  . Sexual activity: Yes  Other Topics Concern  . Not on file  Social History Narrative   Health Care POA:    Emergency Contact: Tayjah Lobdell [phone] (c)   End of Life Plan:    Who lives with you: Lives with husband   Any pets: none   Diet: Patient lacks financial resources for much food. Pt reports eating what is available.   Exercise: Patient does not have an exercise plan.    Seatbelts: Patient reports wearing seatbelt when in vehicle.    [name] Exposure/Protection:   Hobbies: Bowling, computer games, Bingo      Has financial difficulties and transportation issues as she and her husband share transportation       Social Determinants of Health   Financial Resource Strain:   . Difficulty of Paying Living Expenses:   Food Insecurity:   . Worried About Charity fundraiser in the Last Year:   . Arboriculturist in the Last Year:   Transportation Needs:   . Film/video editor (Medical):   Marland Kitchen Lack of Transportation (Non-Medical):   Physical Activity:   . Days of Exercise per Week:   . Minutes of Exercise per Session:   Stress:   . Feeling of Stress :   Social Connections:   . Frequency of Communication with Friends and Family:   . Frequency of Social Gatherings with Friends and Family:   . Attends Religious Services:   . Active Member of Clubs or Organizations:   . Attends Archivist Meetings:   Marland Kitchen Marital Status:   Intimate Partner Violence:   . Fear of Current or Ex-Partner:   . Emotionally Abused:   Marland Kitchen Physically Abused:   . Sexually Abused:     FAMILY HISTORY: Family History  Problem Relation Age of Onset  . Heart disease Mother   . Asthma Mother   . Cancer Mother        uterine   . Depression Mother   . Heart attack Mother 61  . Hyperlipidemia Mother   . Hypertension Mother   . Stroke Mother   . Kidney disease Mother   . Heart attack Sister   . Stroke Sister   . Depression Sister   . Diabetes Sister   . Hyperlipidemia Sister   . Depression Sister   . Diabetes Sister   . Hyperlipidemia Sister   . HIV/AIDS Sister   . Diabetes Brother   . Asthma Brother   . Hypertension Brother   . Cancer Maternal Aunt        lung  . Heart disease Maternal Grandmother   . Heart attack Maternal Grandmother   . Cancer Brother        colon  . HIV/AIDS Brother   . Colon cancer Neg Hx     ALLERGIES:  is allergic to no healthtouch food  allergies, aspirin, and baclofen.  MEDICATIONS:  Current Outpatient Medications  Medication Sig Dispense Refill  . albuterol (ACCUNEB) 0.63 MG/3ML nebulizer solution Inhale 1 vial via nebulizer every 6 hours for wheezing as needed 75 mL 11  . albuterol (VENTOLIN HFA) 108 (90 Base) MCG/ACT  inhaler INHALE 2 PUFFS BY MOUTH EVERY 6 HOURS AS NEEDED FOR WHEEZING OR SHORTNESS OF BREATH (Patient taking differently: Inhale 2 puffs into the lungs every 6 (six) hours as needed for wheezing or shortness of breath. ) 18 g 11  . Blood Glucose Monitoring Suppl (ONETOUCH VERIO) w/Device KIT 1 Device by Does not apply route daily. 1 kit 0  . BREO ELLIPTA 100-25 MCG/INH AEPB Inhale 1 puff by mouth every day (Patient taking differently: Inhale 1 puff into the lungs daily. ) 60 each 11  . clopidogrel (PLAVIX) 75 MG tablet Take 1 tablet by mouth every day 30 tablet 11  . cyclobenzaprine (FLEXERIL) 10 MG tablet Take 1 tablet by mouth 3 times a day as needed for muscle spasm (Patient taking differently: Take 10 mg by mouth 3 (three) times daily as needed for muscle spasms. ) 30 tablet 11  . diclofenac sodium (VOLTAREN) 1 % GEL Apply 2 g topically 4 (four) times daily. (Patient taking differently: Apply 2 g topically 4 (four) times daily as needed (pain). ) 100 g 11  . DULoxetine (CYMBALTA) 30 MG capsule TAKE ONE CAPSULE BY MOUTH EVERY DAY (Patient taking differently: TAKE ONE CAPSULE BY MOUTH EVERY DAY) 30 capsule 11  . famotidine (PEPCID) 20 MG tablet Take 2 tablets by mouth every day 60 tablet 11  . furosemide (LASIX) 20 MG tablet Take 1 tablet (20 mg total) by mouth daily. 30 tablet 3  . gabapentin (NEURONTIN) 300 MG capsule Take 1 capsule (300 mg total) by mouth 2 (two) times daily. 60 capsule 3  . glucose blood (ONETOUCH VERIO) test strip Use to check blood sugar once a day 100 each 12  . hydrALAZINE (APRESOLINE) 50 MG tablet Take 50 mg by mouth 3 (three) times daily.    Marland Kitchen JARDIANCE 10 MG TABS tablet Take 1 tablet  by mouth every day 30 tablet 2  . Lancets (ONETOUCH ULTRASOFT) lancets Use to check blood sugar once a day 100 each 12  . metFORMIN (GLUCOPHAGE) 500 MG tablet Take 1 tablet by mouth every day with breakfast. (Patient taking differently: Take 500 mg by mouth in the morning and at bedtime. ) 30 tablet 11  . montelukast (SINGULAIR) 10 MG tablet Take 1 tablet by mouth at bedtime 30 tablet 11  . nitroGLYCERIN (NITROSTAT) 0.4 MG SL tablet Dissolve 1 tablet under tongue every 5 minutes, up to 3 doses for chest pain 25 tablet 11  . simvastatin (ZOCOR) 20 MG tablet Take 1 tablet by mouth at bedtime 30 tablet 11  . traMADol (ULTRAM) 50 MG tablet Take 1 tablet by mouth every day as needed for moderate pain (Patient taking differently: Take 50 mg by mouth every 6 (six) hours as needed for moderate pain or severe pain. ) 30 tablet 4   No current facility-administered medications for this visit.    REVIEW OF SYSTEMS:   A 10+ POINT REVIEW OF SYSTEMS WAS OBTAINED including neurology, dermatology, psychiatry, cardiac, respiratory, lymph, extremities, GI, GU, Musculoskeletal, constitutional, breasts, reproductive, HEENT.  All pertinent positives are noted in the HPI.  All others are negative.  PHYSICAL EXAMINATION: ECOG FS:2 - Symptomatic, <50% confined to bed  Vitals:   08/05/20 1337  BP: 133/64  Pulse: 79  Resp: 18  Temp: (!) 97.5 F (36.4 C)  SpO2: 99%   Wt Readings from Last 3 Encounters:  08/05/20 136 lb 12.8 oz (62.1 kg)  08/04/20 137 lb (62.1 kg)  07/10/20 138 lb 3.2 oz (62.7 kg)  Body mass index is 25.02 kg/m.     Exam was given in a chair   GENERAL:alert, in no acute distress and comfortable SKIN: no acute rashes, no significant lesions EYES: conjunctiva are pink and non-injected, sclera anicteric OROPHARYNX: MMM, no exudates, no oropharyngeal erythema or ulceration NECK: supple, no JVD LYMPH:  no palpable lymphadenopathy in the inguinal region. Few, small lymph nodes in the cervical  region and axilla b/l.  LUNGS: clear to auscultation b/l with normal respiratory effort HEART: regular rate & rhythm ABDOMEN:  normoactive bowel sounds , non tender, not distended. No palpable hepatosplenomegaly.  Extremity: no pedal edema PSYCH: alert & oriented x 3 with fluent speech NEURO: no focal motor/sensory deficits  LABORATORY DATA:  I have reviewed the data as listed  . CBC Latest Ref Rng & Units 08/05/2020 06/21/2020 06/19/2020  WBC 4.0 - 10.5 K/uL 117.0(HH) 103.9(HH) 101.7(HH)  Hemoglobin 12.0 - 15.0 g/dL 11.5(L) 11.3(L) 10.8(L)  Hematocrit 36 - 46 % 38.6 37.8 36.5  Platelets 150 - 400 K/uL 232 236 224    . CMP Latest Ref Rng & Units 08/05/2020 06/21/2020 06/19/2020  Glucose 70 - 99 mg/dL 163(H) 100(H) 128(H)  BUN 8 - 23 mg/dL _0 Creatinine 0.44 - 1.00 mg/dL 1.62(H) 1.61(H) 1.71(H)  Sodium 135 - 145 mmol/L 143 142 142  Potassium 3.5 - 5.1 mmol/L 3.9 5.1 5.0  Chloride 98 - 111 mmol/L 108 103 107  CO2 22 - 32 mmol/L _1 Calcium 8.9 - 10.3 mg/dL 9.9 8.9 8.6(L)  Total Protein 6.5 - 8.1 g/dL 6.8 - -  Total Bilirubin 0.3 - 1.2 mg/dL 0.3 - -  Alkaline Phos 38 - 126 U/L 207(H) - -  AST 15 - 41 U/L 15 - -  ALT 0 - 44 U/L 10 - -   . Lab Results  Component Value Date   LDH 291 (H) 08/05/2020        . Lab Results  Component Value Date   LDH 291 (H) 08/05/2020    RADIOGRAPHIC STUDIES: I have personally reviewed the radiological images as listed and agreed with the findings in the report. VAS [place] ABI WITH/WO TBI  Result Date: 08/04/2020 LOWER EXTREMITY DOPPLER STUDY Indications: Peripheral artery disease.  Vascular Interventions: Right SFA stent 2009 by Dr. Trula Slade. Left fem-pop BPG in                         [place]. Performing Technologist: Ralene Cork RVT  Examination Guidelines: A complete evaluation includes at minimum, Doppler waveform signals and systolic blood pressure reading at the level of bilateral brachial, anterior tibial, and  posterior tibial arteries, when vessel segments are accessible. Bilateral testing is considered an integral part of a complete examination. Photoelectric Plethysmograph (PPG) waveforms and toe systolic pressure readings are included as required and additional duplex testing as needed. Limited examinations for reoccurring indications may be performed as noted.  ABI Findings: +---------+------------------+-----+----------+--------+ Right    Rt Pressure (mmHg)IndexWaveform  Comment  +---------+------------------+-----+----------+--------+ Brachial 141                                       +---------+------------------+-----+----------+--------+ PTA      77                0.55 monophasic         +---------+------------------+-----+----------+--------+ DP  73                0.52 monophasic         +---------+------------------+-----+----------+--------+ Great Toe45                0.32                    +---------+------------------+-----+----------+--------+ +---------+------------------+-----+----------+-------+ Left     Lt Pressure (mmHg)IndexWaveform  Comment +---------+------------------+-----+----------+-------+ Brachial 135                                      +---------+------------------+-----+----------+-------+ PTA      79                0.56 monophasic        +---------+------------------+-----+----------+-------+ DP       73                0.52 monophasic        +---------+------------------+-----+----------+-------+ Great Toe66                0.47                   +---------+------------------+-----+----------+-------+ +-------+-----------+-----------+------------+------------+ ABI/TBIToday's ABIToday's TBIPrevious ABIPrevious TBI +-------+-----------+-----------+------------+------------+ Right  0.55       0.32       0.68        0.5          +-------+-----------+-----------+------------+------------+ Left   0.56       0.47        0.91        0.62         +-------+-----------+-----------+------------+------------+  Previous ABI on 05/14/18.  Summary: Right: Resting right ankle-brachial index indicates moderate right lower extremity arterial disease. The right toe-brachial index is abnormal. RT great toe pressure = 45 mmHg. Left: Resting left ankle-brachial index indicates moderate left lower extremity arterial disease. The left toe-brachial index is abnormal. LT Great toe pressure = 66 mmHg.  *See table(s) above for measurements and observations.  Electronically signed by Harold Barban MD on 08/04/2020 at 2:54:14 PM.    Final    VAS [place] LOWER EXTREMITY ARTERIAL DUPLEX  Result Date: 08/04/2020 LOWER EXTREMITY ARTERIAL DUPLEX STUDY  Current ABI: Right: 0.55/0.32 left: 0.56/0.47 Comparison Study: none Performing Technologist: Ralene Cork RVT  Examination Guidelines: A complete evaluation includes B-mode imaging, spectral Doppler, color Doppler, and power Doppler as needed of all accessible portions of each vessel. Bilateral testing is considered an integral part of a complete examination. Limited examinations for reoccurring indications may be performed as noted.  +-----------+-------+-----+--------------+----------+-------------------------+ RIGHT      PSV    RatioStenosis      Waveform  Comments                             cm/s                                                          +-----------+-------+-----+--------------+----------+-------------------------+ CFA Distal 136                       monophasic                          +-----------+-------+-----+--------------+----------+-------------------------+  DFA        265         50-74%        monophasic                                                 stenosis                                          +-----------+-------+-----+--------------+----------+-------------------------+ SFA Prox   34                        monophasictrickle  flow/near                                                        occlusion                 +-----------+-------+-----+--------------+----------+-------------------------+ SFA Mid    0           occluded                stent                     +-----------+-------+-----+--------------+----------+-------------------------+ SFA Distal 0           occluded                stent                     +-----------+-------+-----+--------------+----------+-------------------------+ POP Prox   63                        monophasic                          +-----------+-------+-----+--------------+----------+-------------------------+ POP Distal 51                        monophasic                          +-----------+-------+-----+--------------+----------+-------------------------+ ATA Distal 44                        monophasic                          +-----------+-------+-----+--------------+----------+-------------------------+ PTA Distal 46                        monophasic                          +-----------+-------+-----+--------------+----------+-------------------------+ PERO Distal14                        monophasic                          +-----------+-------+-----+--------------+----------+-------------------------+  +-----------+--------+-----+---------------+----------+--------+ LEFT  PSV cm/sRatioStenosis       Waveform  Comments +-----------+--------+-----+---------------+----------+--------+ CFA Distal 437          75-99% stenosismonophasic         +-----------+--------+-----+---------------+----------+--------+ DFA        204          50-74% stenosismonophasic         +-----------+--------+-----+---------------+----------+--------+ SFA Prox                                         graft    +-----------+--------+-----+---------------+----------+--------+ SFA Mid                                          graft     +-----------+--------+-----+---------------+----------+--------+ SFA Distal                                       graft    +-----------+--------+-----+---------------+----------+--------+ POP Prox   50                          monophasic         +-----------+--------+-----+---------------+----------+--------+ POP Distal 42                          monophasic         +-----------+--------+-----+---------------+----------+--------+ ATA Distal 38                          monophasic         +-----------+--------+-----+---------------+----------+--------+ PTA Distal 34                          monophasic         +-----------+--------+-----+---------------+----------+--------+ PERO Distal29                          monophasic         +-----------+--------+-----+---------------+----------+--------+  Left Graft #1: +--------------------+--------+--------+----------+--------------+                     PSV cm/sStenosisWaveform  Comments       +--------------------+--------+--------+----------+--------------+ Inflow              331             monophasic               +--------------------+--------+--------+----------+--------------+ Proximal Anastomosis423             monophasic               +--------------------+--------+--------+----------+--------------+ Proximal Graft              occluded                         +--------------------+--------+--------+----------+--------------+ Mid Graft                   occluded                         +--------------------+--------+--------+----------+--------------+ Distal Graft  occluded                         +--------------------+--------+--------+----------+--------------+ Distal Anastomosis                            not visualized +--------------------+--------+--------+----------+--------------+ Outflow             50              monophasic                +--------------------+--------+--------+----------+--------------+   Summary: Right: 50-74% stenosis at the origin of the profunda artery. Occluded stent with reconstitution in the popliteal artery. Left: 70-99% stenosis in the CFA. 50-74% stenosis at the origin of the profunda artery. Occluded graft with reconstitution in the popliteal artery.  See table(s) above for measurements and observations. Electronically signed by Harold Barban MD on 08/04/2020 at 2:54:26 PM.    Final    Surgical Pathology 12/13/2017   ASSESSMENT & PLAN:   [name] [name] is a wonderful 74 y.o. female with    1. Rai 1 Chronic Lymphocytic Leukemia  - Trisomy 12 mutation. No associated thrombocytopenia.  Mild anemia - likely from acute blood loss from significant epistaxis. No constitutional symptoms. Minimal LNaednopathy No splenomegaly  #2 .Lung nodules on CT in 12/2017. Rpt CT chest 08/09/2018 - stable  #3 Patient Active Problem List   Diagnosis Date Noted  . (HFpEF) heart failure with preserved ejection fraction ([place]) 06/11/2020  . Venous insufficiency (chronic) (peripheral) 06/22/2019  . Pulmonary nodules 01/04/2018  . Mass of left lower leg 01/04/2018  . Chronic kidney disease (CKD), stage III (moderate) ([place]) 06/04/2016  . Chronic back pain 10/02/2015  . DM (diabetes mellitus), type 2 with peripheral vascular complications ([place]) 06/19/7627  . Chronic lymphocytic leukemia ([place]) 04/11/2009  . PERIPHERAL VASCULAR DISEASE 10/13/2007  . HYPERCHOLESTEROLEMIA 02/23/2007  . ANXIETY 02/23/2007  . TOBACCO DEPENDENCE 02/23/2007  . Essential hypertension 02/23/2007  . COPD (chronic obstructive pulmonary disease) ([place]) 02/23/2007  . GASTROESOPHAGEAL REFLUX, NO ESOPHAGITIS 02/23/2007   PLAN: -Discussed pt labwork today, 08/05/20; blood counts are stable, kidney numbers are stable, ALP is elevated, LDH is elevated but stable. -No lab or clinical evidence of CLL progression at this time.  -No indication to begin  treatment of CLL. Will continue to monitor WBC and other blood counts. -Will see back in 4 months with labs    FOLLOW UP: RTC with Dr Irene Limbo with labs in 4 months   The total time spent in the appt was 20 minutes and more than 50% was on counseling and direct patient cares.  All of the patient's questions were answered with apparent satisfaction. The patient knows to call the clinic with any problems, questions or concerns.   Sullivan Lone MD [place] AAHIVMS [facility] Hematology/Oncology Physician [facility]  (Office):       [phone] (Work cell):  [phone] (Fax):           [phone]  I, Yevette Edwards, am acting as a scribe for Dr. Sullivan Lone.   .I have reviewed the above documentation for accuracy and completeness, and I agree with the above. Brunetta Genera MD

## 2020-08-07 ENCOUNTER — Encounter: Payer: Self-pay | Admitting: Cardiology

## 2020-08-07 ENCOUNTER — Telehealth (HOSPITAL_COMMUNITY): Payer: Self-pay | Admitting: *Deleted

## 2020-08-07 ENCOUNTER — Ambulatory Visit (INDEPENDENT_AMBULATORY_CARE_PROVIDER_SITE_OTHER): Payer: Medicare Other | Admitting: Cardiology

## 2020-08-07 ENCOUNTER — Other Ambulatory Visit: Payer: Self-pay

## 2020-08-07 VITALS — BP 140/66 | HR 80 | Ht 62.0 in | Wt 138.6 lb

## 2020-08-07 DIAGNOSIS — I5032 Chronic diastolic (congestive) heart failure: Secondary | ICD-10-CM

## 2020-08-07 DIAGNOSIS — R079 Chest pain, unspecified: Secondary | ICD-10-CM

## 2020-08-07 DIAGNOSIS — E7800 Pure hypercholesterolemia, unspecified: Secondary | ICD-10-CM

## 2020-08-07 DIAGNOSIS — I739 Peripheral vascular disease, unspecified: Secondary | ICD-10-CM | POA: Diagnosis not present

## 2020-08-07 DIAGNOSIS — N1832 Chronic kidney disease, stage 3b: Secondary | ICD-10-CM

## 2020-08-07 DIAGNOSIS — I10 Essential (primary) hypertension: Secondary | ICD-10-CM

## 2020-08-07 DIAGNOSIS — R072 Precordial pain: Secondary | ICD-10-CM

## 2020-08-07 MED ORDER — ATORVASTATIN CALCIUM 40 MG PO TABS
40.0000 mg | ORAL_TABLET | Freq: Every day | ORAL | 3 refills | Status: DC
Start: 2020-08-07 — End: 2020-09-29

## 2020-08-07 NOTE — Progress Notes (Addendum)
Cardiology Consult Note    Date:  08/07/2020   ID:  ACADIA THAMMAVONG, DOB 04-29-1946, MRN [id number]  PCP:  Danna Hefty, DO  Cardiologist:  Fransico Him, MD   Chief Complaint  Patient presents with  . New Patient (Initial Visit)    CHF, chest pain, PVD    History of Present Illness:  [name] [name] is a 74 y.o. female who is being seen today for the evaluation of chronic diastolic CHF at the request of Andrena Mews T, MD.  This is a 74yo AAF with a hx of CLL, asthma, chronic diastolic CHF, DM2, GERD, HTN, HLD, chronic venous insufficiency, CKD stage 3 and PVD.  She is referred to establish Cardiac care for her CHF.  She sees Dr. Arita Miss for PVD and recently saw [name] for severe claudication and had an US showing bilateral SFA occlusions on [place] in his office.  Plan is to proceed with arteriogram to define anatomy on 8/17.    She is here to establish care.  She has chronic DOE that is stable.  She has chronic LE edema but this has been very well controlled on diuretics and compression hose. She occasionally has CP but not very often and thinks it is better on Lasix.  She will get CP when she gets upset. There is no radiation of the pain and no associated diaphoresis or nausea. She denies any  PND, orthopnea,  dizziness, palpitations or syncope. She has chronic venous insufficiency She is compliant with her meds and is tolerating meds with no SE.     Past Medical History:  Diagnosis Date  . Allergy    environmental  . Asthma   . CLL (chronic lymphoblastic leukemia)   . Constipation 11/15/2011  . COPD (chronic obstructive pulmonary disease) ([place])   . Diabetes mellitus without complication ([place])   . GERD (gastroesophageal reflux disease)   . Hyperlipidemia   . Hypertension   . PVD (peripheral vascular disease) ([place])   . Stroke [facility]) 1998   in 1998 due to tumor-right side    Past Surgical History:  Procedure Laterality Date  . ABI  02/2012   ABI <0.65 BL 02/2012  . BRAIN  MENINGIOMA EXCISION    . Bypass grafting of RLE for PAD claudication     . CESAREAN SECTION     1974, 78 ,80  . CHOLECYSTECTOMY, LAPAROSCOPIC    . COLONOSCOPY  2014   [place]  . ESOPHAGEAL DILATION      Current Medications: Current Meds  Medication Sig  . albuterol (ACCUNEB) 0.63 MG/3ML nebulizer solution Inhale 1 vial via nebulizer every 6 hours for wheezing as needed  . albuterol (VENTOLIN HFA) 108 (90 Base) MCG/ACT inhaler INHALE 2 PUFFS BY MOUTH EVERY 6 HOURS AS NEEDED FOR WHEEZING OR SHORTNESS OF BREATH (Patient taking differently: Inhale 2 puffs into the lungs every 6 (six) hours as needed for wheezing or shortness of breath. )  . Blood Glucose Monitoring Suppl (ONETOUCH VERIO) w/Device KIT 1 Device by Does not apply route daily.  Marland Kitchen BREO ELLIPTA 100-25 MCG/INH AEPB Inhale 1 puff by mouth every day (Patient taking differently: Inhale 1 puff into the lungs daily. )  . clopidogrel (PLAVIX) 75 MG tablet Take 1 tablet by mouth every day  . cyclobenzaprine (FLEXERIL) 10 MG tablet Take 1 tablet by mouth 3 times a day as needed for muscle spasm (Patient taking differently: Take 10 mg by mouth 3 (three) times daily as needed for muscle spasms. )  .  diclofenac sodium (VOLTAREN) 1 % GEL Apply 2 g topically 4 (four) times daily. (Patient taking differently: Apply 2 g topically 4 (four) times daily as needed (pain). )  . DULoxetine (CYMBALTA) 30 MG capsule TAKE ONE CAPSULE BY MOUTH EVERY DAY (Patient taking differently: TAKE ONE CAPSULE BY MOUTH EVERY DAY)  . famotidine (PEPCID) 20 MG tablet Take 2 tablets by mouth every day  . furosemide (LASIX) 20 MG tablet Take 1 tablet (20 mg total) by mouth daily.  Marland Kitchen gabapentin (NEURONTIN) 300 MG capsule Take 1 capsule (300 mg total) by mouth 2 (two) times daily.  Marland Kitchen glucose blood (ONETOUCH VERIO) test strip Use to check blood sugar once a day  . hydrALAZINE (APRESOLINE) 50 MG tablet Take 50 mg by mouth 3 (three) times daily.  Marland Kitchen JARDIANCE 10 MG TABS  tablet Take 1 tablet by mouth every day  . Lancets (ONETOUCH ULTRASOFT) lancets Use to check blood sugar once a day  . metFORMIN (GLUCOPHAGE) 500 MG tablet Take 1 tablet by mouth every day with breakfast. (Patient taking differently: Take 500 mg by mouth in the morning and at bedtime. )  . montelukast (SINGULAIR) 10 MG tablet Take 1 tablet by mouth at bedtime  . nitroGLYCERIN (NITROSTAT) 0.4 MG SL tablet Dissolve 1 tablet under tongue every 5 minutes, up to 3 doses for chest pain  . simvastatin (ZOCOR) 20 MG tablet Take 1 tablet by mouth at bedtime  . traMADol (ULTRAM) 50 MG tablet Take 1 tablet by mouth every day as needed for moderate pain (Patient taking differently: Take 50 mg by mouth every 6 (six) hours as needed for moderate pain or severe pain. )  . [DISCONTINUED] baclofen (LIORESAL) 10 MG tablet Take 1 tablet (10 mg total) by mouth daily at 6 PM.    Allergies:   No healthtouch food allergies, Aspirin, and Baclofen   Social History   Socioeconomic History  . Marital status: Married    Spouse name: Not on file  . Number of children: 3  . Years of education: Not on file  . Highest education level: Not on file  Occupational History  . Occupation: DISABLED    Employer: DISABLED  Tobacco Use  . Smoking status: Current Some Day Smoker    Packs/day: 0.25    Years: 30.00    Pack years: 7.50    Types: Cigarettes    Start date: 01/01/1976  . Smokeless tobacco: Never Used  . 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 Vaping Use: Never used  Substance and Sexual Activity  . Alcohol use: No    Alcohol/week: 0.0 standard drinks  . Drug use: No  . Sexual activity: Yes  Other Topics Concern  . Not on file  Social History Narrative   Health Care POA:    Emergency Contact: Kinsleigh Ludolph [phone] (c)   End of Life Plan:    Who lives with you: Lives with husband   Any pets: none   Diet: Patient lacks financial resources for much food. Pt reports eating what is  available.   Exercise: Patient does not have an exercise plan.   Seatbelts: Patient reports wearing seatbelt when in vehicle.    [name] Exposure/Protection:   Hobbies: Bowling, computer games, Bingo      Has financial difficulties and transportation issues as she and her husband share transportation       Social Determinants of Health   Financial Resource Strain:   . Difficulty of Paying Living Expenses:  Food Insecurity:   . Worried About Charity fundraiser in the Last Year:   . Arboriculturist in the Last Year:   Transportation Needs:   . Film/video editor (Medical):   Marland Kitchen Lack of Transportation (Non-Medical):   Physical Activity:   . Days of Exercise per Week:   . Minutes of Exercise per Session:   Stress:   . Feeling of Stress :   Social Connections:   . Frequency of Communication with Friends and Family:   . Frequency of Social Gatherings with Friends and Family:   . Attends Religious Services:   . Active Member of Clubs or Organizations:   . Attends Archivist Meetings:   Marland Kitchen Marital Status:      Family History:  The patient's family history includes Asthma in her brother and mother; Cancer in her brother, maternal aunt, and mother; Depression in her mother, sister, and sister; Diabetes in her brother, sister, and sister; HIV/AIDS in her brother and sister; Heart attack in her maternal grandmother and sister; Heart attack (age of onset: 41) in her mother; Heart disease in her maternal grandmother and mother; Hyperlipidemia in her mother, sister, and sister; Hypertension in her brother and mother; Kidney disease in her mother; Stroke in her mother and sister.   ROS:   Please see the history of present illness.    ROS All other systems reviewed and are negative.  No flowsheet data found.     PHYSICAL EXAM:   VS:  BP 140/66   Pulse 80   Ht 5' 2" (1.575 m)   Wt 138 lb 9.6 oz (62.9 kg)   SpO2 95%   BMI 25.35 kg/m    GEN: Well nourished, well developed,  in no acute distress  HEENT: normal  Neck: no JVD, carotid bruits, or masses Cardiac: RRR; no murmurs, rubs, or gallops,no edema.   Respiratory:  clear to auscultation bilaterally, normal work of breathing GI: soft, nontender, nondistended, + BS MS: no deformity or atrophy  Skin: warm and dry, no rash Neuro:  Alert and Oriented x 3, Strength and sensation are intact Psych: euthymic mood, full affect  Wt Readings from Last 3 Encounters:  08/07/20 138 lb 9.6 oz (62.9 kg)  08/05/20 136 lb 12.8 oz (62.1 kg)  08/04/20 137 lb (62.1 kg)      Studies/Labs Reviewed:   EKG:  EKG is not ordered today.    Recent Labs: 06/19/2020: B Natriuretic Peptide 29.2 08/05/2020: ALT 10; BUN 11; Creatinine 1.62; Hemoglobin 11.5; Platelets 232; Potassium 3.9; Sodium 143   Lipid Panel    Component Value Date/Time   CHOL 163 03/03/2020 1359   TRIG 250 (H) 03/03/2020 1359   HDL 39 (L) 03/03/2020 1359   CHOLHDL 4.2 03/03/2020 1359   CHOLHDL 3.1 10/29/2016 1022   VLDL 36 (H) 10/29/2016 1022   LDLCALC 83 03/03/2020 1359   LDLDIRECT 62 09/24/2010 2055    Additional studies/ records that were reviewed today include:  OV notes from PCP and Dr. Arita Miss, EKG    ASSESSMENT:    1. Chronic heart failure with preserved ejection fraction ([place])   2. Essential hypertension   3. HYPERCHOLESTEROLEMIA   4. PERIPHERAL VASCULAR DISEASE   5. Stage 3b chronic kidney disease   6. Chest pain of uncertain etiology      PLAN:  In order of problems listed above:  1.  Chronic diastolic CHF -she does not appear volume overloaded on exam tdoay -her last echo  in May showed hyperdynamic LVF with EF 70-75% and moderate LVH and increased filling pressures -continue Lasix 74m daily -SCr stabe at 1.62  2.  HTN -BP controlled on exam today although would like to see it 130/769mg -Lopressor 2538mID  3.  HLD -LDL goal < 70 due to PVD -LDL was 83 in March -will change Zocor to Atorvastatin 65m63mily  -repeat  FLP and ALT in 6 weeks  4.  PVD -followed by Drl Cherre Robinsh [facility] surgery -recent US s[place] occlusion of the bilateral SFA occlusions -Plan is to proceed with arteriogram to define anatomy on 8/17.   -continue Plavix and statin  5.  CKD stage 3b -SCr stable at 1.62 -followed by PCP  6.  CHest pain  -somewhat atypical and occurs when she gets upset -she has known PVD and likely has CAD as well - Lexiscan myoview to rule out ischemia    Medication Adjustments/Labs and Tests Ordered: Current medicines are reviewed at length with the patient today.  Concerns regarding medicines are outlined above.  Medication changes, Labs and Tests ordered today are listed in the Patient Instructions below.  There are no Patient Instructions on file for this visit.   Signed, [name] [name]  08/07/2020 9:07 AM    [facility] [address] [place]  [id number]: [phone]: [phone]

## 2020-08-07 NOTE — Patient Instructions (Addendum)
Medication Instructions:  Your physician has recommended you make the following change in your medication:  1) STOP taking Zocor (simvastatin) 2) START taking Lipitor (atorvastatin) 40 mg daily *If you need a refill on your cardiac medications before your next appointment, please call your pharmacy*  Lab Work: Fasting lipids and ALT in 6 weeks  If you have labs (blood work) drawn today and your tests are completely normal, you will receive your results only by: Marland Kitchen MyChart Message (if you have MyChart) OR . A paper copy in the mail If you have any lab test that is abnormal or we need to change your treatment, we will call you to review the results.   Testing/Procedures: Your physician has requested that you have a lexiscan myoview. For further information please visit [email or web address]. Please follow instruction sheet, as given.  Follow-Up: At [facility], you and your health needs are our priority.  As part of our continuing mission to provide you with exceptional heart care, we have created designated Provider Care Teams.  These Care Teams include your primary Cardiologist (physician) and Advanced Practice Providers (APPs -  Physician Assistants and Nurse Practitioners) who all work together to provide you with the care you need, when you need it.  Your next appointment:   6 month(s)  The format for your next appointment:   In Person  Provider:   You may see Fransico Him, MD or one of the following Advanced Practice Providers on your designated Care Team:    Melina Copa, PA-C  Ermalinda Barrios, PA-C

## 2020-08-07 NOTE — Addendum Note (Signed)
Addended by: Antonieta Iba on: 08/07/2020 09:13 AM   Modules accepted: Orders

## 2020-08-07 NOTE — Telephone Encounter (Signed)
Patient given detailed instructions per Myocardial Perfusion Study Information Sheet for the test on 08/11/20 at 8:00. Patient notified to arrive 15 minutes early and that it is imperative to arrive on time for appointment to keep from having the test rescheduled.  If you need to cancel or reschedule your appointment, please call the office within 24 hours of your appointment. . Patient verbalized understanding.[name] [name]

## 2020-08-08 ENCOUNTER — Other Ambulatory Visit: Payer: Self-pay

## 2020-08-10 ENCOUNTER — Other Ambulatory Visit: Payer: Self-pay | Admitting: Family Medicine

## 2020-08-11 ENCOUNTER — Other Ambulatory Visit: Payer: Self-pay

## 2020-08-11 ENCOUNTER — Telehealth: Payer: Self-pay

## 2020-08-11 ENCOUNTER — Other Ambulatory Visit (HOSPITAL_COMMUNITY)
Admission: RE | Admit: 2020-08-11 | Discharge: 2020-08-11 | Disposition: A | Discharge status: Home / Self Care | Payer: Medicare Other | Source: Ambulatory Visit | Attending: Surgery | Admitting: Surgery

## 2020-08-11 ENCOUNTER — Ambulatory Visit (HOSPITAL_BASED_OUTPATIENT_CLINIC_OR_DEPARTMENT_OTHER): Payer: Medicare Other

## 2020-08-11 DIAGNOSIS — R072 Precordial pain: Secondary | ICD-10-CM | POA: Insufficient documentation

## 2020-08-11 DIAGNOSIS — R0609 Other forms of dyspnea: Secondary | ICD-10-CM | POA: Insufficient documentation

## 2020-08-11 DIAGNOSIS — Z20822 Contact with and (suspected) exposure to covid-19: Secondary | ICD-10-CM | POA: Insufficient documentation

## 2020-08-11 DIAGNOSIS — Z01818 Encounter for other preprocedural examination: Secondary | ICD-10-CM | POA: Insufficient documentation

## 2020-08-11 DIAGNOSIS — I509 Heart failure, unspecified: Secondary | ICD-10-CM | POA: Insufficient documentation

## 2020-08-11 DIAGNOSIS — Z8673 Personal history of transient ischemic attack (TIA), and cerebral infarction without residual deficits: Secondary | ICD-10-CM | POA: Insufficient documentation

## 2020-08-11 DIAGNOSIS — R079 Chest pain, unspecified: Secondary | ICD-10-CM | POA: Insufficient documentation

## 2020-08-11 LAB — MYOCARDIAL PERFUSION IMAGING
LV dias vol: 71 mL (ref 46–106)
LV sys vol: 26 mL
Peak HR: 90 {beats}/min
Rest HR: 69 {beats}/min
SDS: 0
SRS: 0
SSS: 0
TID: 1

## 2020-08-11 LAB — SARS CORONAVIRUS 2 (TAT 6-24 HRS): SARS Coronavirus 2: NEGATIVE

## 2020-08-11 MED ORDER — TECHNETIUM TC 99M TETROFOSMIN IV KIT
9.8000 | PACK | Freq: Once | INTRAVENOUS | Status: AC | PRN
  Administered 2020-08-11: 9.8 via INTRAVENOUS
  Filled 2020-08-11: qty 10

## 2020-08-11 MED ORDER — TECHNETIUM TC 99M TETROFOSMIN IV KIT
31.3000 | PACK | Freq: Once | INTRAVENOUS | Status: AC | PRN
  Administered 2020-08-11: 31.3 via INTRAVENOUS
  Filled 2020-08-11: qty 32

## 2020-08-11 MED ORDER — REGADENOSON 0.4 MG/5ML IV SOLN
0.4000 mg | Freq: Once | INTRAVENOUS | Status: AC
  Administered 2020-08-11: 0.4 mg via INTRAVENOUS

## 2020-08-11 NOTE — Telephone Encounter (Signed)
Message received from patient with questions about surgery on tomorrow 8/17. Unable to reach pt. LMOM to return call if she still had questions or concerns.

## 2020-08-11 NOTE — Telephone Encounter (Signed)
Spoke with patient who denied having any questions or concerns at this time. Covid test completed.

## 2020-08-11 NOTE — Telephone Encounter (Signed)
Pt called office regarding Covid testing questions. Attempted to reach pt back. LMOM to return call.

## 2020-08-12 ENCOUNTER — Ambulatory Visit (HOSPITAL_COMMUNITY)
Admission: RE | Admit: 2020-08-12 | Discharge: 2020-08-12 | Disposition: A | Discharge status: Home / Self Care | Payer: Medicare Other | Attending: Surgery | Admitting: Surgery

## 2020-08-12 ENCOUNTER — Encounter (HOSPITAL_COMMUNITY)
Admission: RE | Disposition: A | Discharge status: Home / Self Care | Payer: Self-pay | Source: Home / Self Care | Attending: Surgery

## 2020-08-12 DIAGNOSIS — Z886 Allergy status to analgesic agent status: Secondary | ICD-10-CM | POA: Diagnosis not present

## 2020-08-12 DIAGNOSIS — Z801 Family history of malignant neoplasm of trachea, bronchus and lung: Secondary | ICD-10-CM | POA: Insufficient documentation

## 2020-08-12 DIAGNOSIS — Z825 Family history of asthma and other chronic lower respiratory diseases: Secondary | ICD-10-CM | POA: Insufficient documentation

## 2020-08-12 DIAGNOSIS — I70221 Atherosclerosis of native arteries of extremities with rest pain, right leg: Secondary | ICD-10-CM | POA: Diagnosis not present

## 2020-08-12 DIAGNOSIS — Z833 Family history of diabetes mellitus: Secondary | ICD-10-CM | POA: Insufficient documentation

## 2020-08-12 DIAGNOSIS — F17210 Nicotine dependence, cigarettes, uncomplicated: Secondary | ICD-10-CM | POA: Diagnosis not present

## 2020-08-12 DIAGNOSIS — Z8249 Family history of ischemic heart disease and other diseases of the circulatory system: Secondary | ICD-10-CM | POA: Insufficient documentation

## 2020-08-12 DIAGNOSIS — Z7984 Long term (current) use of oral hypoglycemic drugs: Secondary | ICD-10-CM | POA: Diagnosis not present

## 2020-08-12 DIAGNOSIS — K219 Gastro-esophageal reflux disease without esophagitis: Secondary | ICD-10-CM | POA: Diagnosis not present

## 2020-08-12 DIAGNOSIS — Z8049 Family history of malignant neoplasm of other genital organs: Secondary | ICD-10-CM | POA: Insufficient documentation

## 2020-08-12 DIAGNOSIS — E785 Hyperlipidemia, unspecified: Secondary | ICD-10-CM | POA: Insufficient documentation

## 2020-08-12 DIAGNOSIS — Z856 Personal history of leukemia: Secondary | ICD-10-CM | POA: Insufficient documentation

## 2020-08-12 DIAGNOSIS — Z79899 Other long term (current) drug therapy: Secondary | ICD-10-CM | POA: Diagnosis not present

## 2020-08-12 DIAGNOSIS — M79604 Pain in right leg: Secondary | ICD-10-CM | POA: Diagnosis not present

## 2020-08-12 DIAGNOSIS — J449 Chronic obstructive pulmonary disease, unspecified: Secondary | ICD-10-CM | POA: Insufficient documentation

## 2020-08-12 DIAGNOSIS — Z881 Allergy status to other antibiotic agents status: Secondary | ICD-10-CM | POA: Insufficient documentation

## 2020-08-12 DIAGNOSIS — Z823 Family history of stroke: Secondary | ICD-10-CM | POA: Diagnosis not present

## 2020-08-12 DIAGNOSIS — I10 Essential (primary) hypertension: Secondary | ICD-10-CM | POA: Diagnosis not present

## 2020-08-12 DIAGNOSIS — E119 Type 2 diabetes mellitus without complications: Secondary | ICD-10-CM | POA: Insufficient documentation

## 2020-08-12 DIAGNOSIS — Z8673 Personal history of transient ischemic attack (TIA), and cerebral infarction without residual deficits: Secondary | ICD-10-CM | POA: Insufficient documentation

## 2020-08-12 DIAGNOSIS — Z7902 Long term (current) use of antithrombotics/antiplatelets: Secondary | ICD-10-CM | POA: Diagnosis not present

## 2020-08-12 DIAGNOSIS — Z800 Family history of malignant neoplasm of digestive organs: Secondary | ICD-10-CM | POA: Diagnosis not present

## 2020-08-12 HISTORY — PX: ABDOMINAL AORTOGRAM W/LOWER EXTREMITY: CATH118223

## 2020-08-12 LAB — POCT I-STAT, CHEM 8
BUN: 9 mg/dL (ref 8–23)
Calcium, Ion: 1.21 mmol/L (ref 1.15–1.40)
Chloride: 104 mmol/L (ref 98–111)
Creatinine, Ser: 1.4 mg/dL — ABNORMAL HIGH (ref 0.44–1.00)
Glucose, Bld: 114 mg/dL — ABNORMAL HIGH (ref 70–99)
HCT: 37 % (ref 36.0–46.0)
Hemoglobin: 12.6 g/dL (ref 12.0–15.0)
Potassium: 4.6 mmol/L (ref 3.5–5.1)
Sodium: 142 mmol/L (ref 135–145)
TCO2: 27 mmol/L (ref 22–32)

## 2020-08-12 SURGERY — ABDOMINAL AORTOGRAM W/LOWER EXTREMITY
Anesthesia: LOCAL | Laterality: Bilateral

## 2020-08-12 MED ORDER — HYDRALAZINE HCL 20 MG/ML IJ SOLN
INTRAMUSCULAR | Status: AC
  Filled 2020-08-12: qty 1

## 2020-08-12 MED ORDER — HEPARIN (PORCINE) IN NACL 1000-0.9 UT/500ML-% IV SOLN
INTRAVENOUS | Status: DC | PRN
  Administered 2020-08-12 (×2): 500 mL

## 2020-08-12 MED ORDER — SODIUM CHLORIDE 0.9 % WEIGHT BASED INFUSION: 1.0000 mL/kg/h | INTRAVENOUS | Status: DC

## 2020-08-12 MED ORDER — SODIUM CHLORIDE 0.9% FLUSH: 3.0000 mL | INTRAVENOUS | Status: DC | PRN

## 2020-08-12 MED ORDER — MIDAZOLAM HCL 2 MG/2ML IJ SOLN
INTRAMUSCULAR | Status: DC | PRN
  Administered 2020-08-12 (×2): 1 mg via INTRAVENOUS

## 2020-08-12 MED ORDER — MORPHINE SULFATE (PF) 2 MG/ML IV SOLN: 2.0000 mg | INTRAVENOUS | Status: DC | PRN

## 2020-08-12 MED ORDER — IODIXANOL 320 MG/ML IV SOLN
INTRAVENOUS | Status: DC | PRN
  Administered 2020-08-12: 165 mL

## 2020-08-12 MED ORDER — ACETAMINOPHEN 325 MG PO TABS: 650.0000 mg | ORAL_TABLET | ORAL | Status: DC | PRN

## 2020-08-12 MED ORDER — LABETALOL HCL 5 MG/ML IV SOLN: 10.0000 mg | INTRAVENOUS | Status: DC | PRN

## 2020-08-12 MED ORDER — SODIUM CHLORIDE 0.9 % IV SOLN: INTRAVENOUS | Status: DC

## 2020-08-12 MED ORDER — SODIUM CHLORIDE 0.9 % IV SOLN: 250.0000 mL | INTRAVENOUS | Status: DC | PRN

## 2020-08-12 MED ORDER — HYDRALAZINE HCL 20 MG/ML IJ SOLN: 5.0000 mg | INTRAMUSCULAR | Status: DC | PRN

## 2020-08-12 MED ORDER — FENTANYL CITRATE (PF) 100 MCG/2ML IJ SOLN
INTRAMUSCULAR | Status: AC
  Filled 2020-08-12: qty 2

## 2020-08-12 MED ORDER — ONDANSETRON HCL 4 MG/2ML IJ SOLN: 4.0000 mg | Freq: Four times a day (QID) | INTRAMUSCULAR | Status: DC | PRN

## 2020-08-12 MED ORDER — HEPARIN (PORCINE) IN NACL 1000-0.9 UT/500ML-% IV SOLN
INTRAVENOUS | Status: AC
  Filled 2020-08-12: qty 1000

## 2020-08-12 MED ORDER — HYDRALAZINE HCL 20 MG/ML IJ SOLN
INTRAMUSCULAR | Status: DC | PRN
  Administered 2020-08-12: 10 mg via INTRAVENOUS

## 2020-08-12 MED ORDER — SODIUM CHLORIDE 0.9% FLUSH: 3.0000 mL | Freq: Two times a day (BID) | INTRAVENOUS | Status: DC

## 2020-08-12 MED ORDER — LIDOCAINE HCL (PF) 1 % IJ SOLN
INTRAMUSCULAR | Status: DC | PRN
  Administered 2020-08-12: 15 mL

## 2020-08-12 MED ORDER — MIDAZOLAM HCL 2 MG/2ML IJ SOLN
INTRAMUSCULAR | Status: AC
  Filled 2020-08-12: qty 2

## 2020-08-12 MED ORDER — FENTANYL CITRATE (PF) 100 MCG/2ML IJ SOLN
INTRAMUSCULAR | Status: DC | PRN
  Administered 2020-08-12: 25 ug via INTRAVENOUS
  Administered 2020-08-12: 50 ug via INTRAVENOUS

## 2020-08-12 MED ORDER — LIDOCAINE HCL (PF) 1 % IJ SOLN
INTRAMUSCULAR | Status: AC
  Filled 2020-08-12: qty 30

## 2020-08-12 MED ORDER — OXYCODONE HCL 5 MG PO TABS
5.0000 mg | ORAL_TABLET | ORAL | Status: DC | PRN
  Administered 2020-08-12 (×2): 5 mg via ORAL
  Filled 2020-08-12 (×2): qty 1

## 2020-08-12 SURGICAL SUPPLY — 9 items
CATH OMNI FLUSH 5F 65CM (CATHETERS) ×1 IMPLANT
KIT MICROPUNCTURE NIT STIFF (SHEATH) ×1 IMPLANT
KIT PV (KITS) ×2 IMPLANT
SHEATH PINNACLE 5F 10CM (SHEATH) ×1 IMPLANT
SHEATH PROBE COVER 6X72 (BAG) ×1 IMPLANT
SYR MEDRAD MARK V 150ML (SYRINGE) ×1 IMPLANT
TRANSDUCER W/STOPCOCK (MISCELLANEOUS) ×2 IMPLANT
TRAY PV CATH (CUSTOM PROCEDURE TRAY) ×2 IMPLANT
WIRE BENTSON .035X145CM (WIRE) ×1 IMPLANT

## 2020-08-12 NOTE — Telephone Encounter (Signed)
Patient called, no answer, left VM informing that forms are ready for pick up. Copy made and placed in batch scanning. Original placed at front desk for pick up.   Talbot Grumbling, RN

## 2020-08-12 NOTE — Op Note (Signed)
    Patient name: [name] [name] MRN: [id number] DOB: 04-22-1946 Sex: female  08/12/2020 Pre-operative Diagnosis: Right leg pain Post-operative diagnosis:  Same Surgeon:  Annamarie Major Procedure Performed:  1.  Ultrasound-guided access, right femoral artery  2.  Abdominal aortogram  3.  Bilateral lower extremity runoff  4.  Second-order catheterization  5.  Conscious sedation, 5minutes     Indications: The patient has had prior revascularization and now has known superficial femoral artery occlusion bilaterally.  She comes in today for further evaluation.  Procedure:  The patient was identified in the holding area and taken to room 8.  The patient was then placed supine on the table and prepped and draped in the usual sterile fashion.  A time out was called.  Conscious sedation was administered with the use of IV fentanyl and Versed under continuous physician and nurse monitoring.  Heart rate, blood pressure, and oxygen saturation were continuously monitored.  Total sedation time was 48minutes.  Ultrasound was used to evaluate the right common femoral artery.  It was patent .  A digital ultrasound image was acquired.  A micropuncture needle was used to access the right common femoral artery under ultrasound guidance.  An 018 wire was advanced without resistance and a micropuncture sheath was placed.  The 018 wire was removed and a benson wire was placed.  The micropuncture sheath was exchanged for a 5 french sheath.  An omniflush catheter was advanced over the wire to the level of L-1.  An abdominal angiogram was obtained.  Next, using the omniflush catheter and a benson wire, the aortic bifurcation was crossed and the catheter was placed into theleft external iliac artery and left runoff was obtained.  right runoff was performed via retrograde sheath injections.  Findings:   Aortogram: No significant renal artery stenosis was identified.  The infrarenal abdominal aorta is widely patent.  Stents  are visualized within bilateral common and external iliac arteries which are widely patent.  Coils are visualized in the left hypogastric artery which is occluded.  Right Lower Extremity: The right common femoral artery is patent however there is a significant stenosis distally.  The profundofemoral artery is widely patent.  The superficial femoral artery and its associated stents are occluded.  There is reconstitution of a diseased above-knee popliteal artery.  The below-knee popliteal artery is widely patent with three-vessel runoff  Left Lower Extremity: The left common femoral artery is patent however distally there is a greater than 80% stenosis.  The profundofemoral artery is patent without stenosis.  The superficial femoral artery is occluded.  There is reconstitution of the above-knee popliteal artery with three-vessel runoff.  Intervention: None  Impression:  #1 widely patent bilateral common and external iliac artery stents  #2 occluded left superficial femoral artery with reconstitution of the above-knee popliteal artery and three-vessel runoff.  There is also a distal common femoral stenosis on the left.  #3 distal right common femoral artery stenosis with occluded superficial femoral artery and reconstitution of a diseased above-knee popliteal artery.  The best distal target for bypass would be the below-knee popliteal artery with three-vessel runoff.     Theotis Burrow, M.D., [facility] [place] Office: [phone] Pager:  [phone]

## 2020-08-12 NOTE — Interval H&P Note (Signed)
History and Physical Interval Note:  08/12/2020 10:15 AM  [name] [name]  has presented today for surgery, with the diagnosis of Claudication.  The various methods of treatment have been discussed with the patient and family. After consideration of risks, benefits and other options for treatment, the patient has consented to  Procedure(s): ABDOMINAL AORTOGRAM W/BILATERAL LOWER EXTREMITY RUNOFF (N/A) as a surgical intervention.  The patient's history has been reviewed, patient examined, no change in status, stable for surgery.  I have reviewed the patient's chart and labs.  Questions were answered to the patient's satisfaction.     Annamarie Major

## 2020-08-12 NOTE — Discharge Instructions (Signed)
Femoral Site Care This sheet gives you information about how to care for yourself after your procedure. Your health care provider may also give you more specific instructions. If you have problems or questions, contact your health care provider. What can I expect after the procedure? After the procedure, it is common to have:  Bruising that usually fades within 1-2 weeks.  Tenderness at the site. Follow these instructions at home: Wound care  Follow instructions from your health care provider about how to take care of your insertion site. Make sure you: ? Wash your hands with soap and water before you change your bandage (dressing). If soap and water are not available, use hand sanitizer. ? Change your dressing as told by your health care provider. ? Leave stitches (sutures), skin glue, or adhesive strips in place. These skin closures may need to stay in place for 2 weeks or longer. If adhesive strip edges start to loosen and curl up, you may trim the loose edges. Do not remove adhesive strips completely unless your health care provider tells you to do that.  Do not take baths, swim, or use a hot tub until your health care provider approves.  You may shower 24-48 hours after the procedure or as told by your health care provider. ? Gently wash the site with plain soap and water. ? Pat the area dry with a clean towel. ? Do not rub the site. This may cause bleeding.  Do not apply powder or lotion to the site. Keep the site clean and dry.  Check your femoral site every day for signs of infection. Check for: ? Redness, swelling, or pain. ? Fluid or blood. ? Warmth. ? Pus or a bad smell. Activity  For the first 2-3 days after your procedure, or as long as directed: ? Avoid climbing stairs as much as possible. ? Do not squat.  Do not lift anything that is heavier than 10 lb (4.5 kg), or the limit that you are told, until your health care provider says that it is safe.  Rest as  directed. ? Avoid sitting for a long time without moving. Get up to take short walks every 1-2 hours.  Do not drive for 24 hours if you were given a medicine to help you relax (sedative). General instructions  Take over-the-counter and prescription medicines only as told by your health care provider.  Keep all follow-up visits as told by your health care provider. This is important. Contact a health care provider if you have:  A fever or chills.  You have redness, swelling, or pain around your insertion site. Get help right away if:  The catheter insertion area swells very fast.  You pass out.  You suddenly start to sweat or your skin gets clammy.  The catheter insertion area is bleeding, and the bleeding does not stop when you hold steady pressure on the area.  The area near or just beyond the catheter insertion site becomes pale, cool, tingly, or numb. These symptoms may represent a serious problem that is an emergency. Do not wait to see if the symptoms will go away. Get medical help right away. Call your local emergency services (911 in the [place] Do not drive yourself to the hospital. Summary  After the procedure, it is common to have bruising that usually fades within 1-2 weeks.  Check your femoral site every day for signs of infection.  Do not lift anything that is heavier than 10 lb (4.5 kg), or the   limit that you are told, until your health care provider says that it is safe. This information is not intended to replace advice given to you by your health care provider. Make sure you discuss any questions you have with your health care provider. Document Revised: 12/26/2017 Document Reviewed: 12/26/2017 Elsevier Patient Education  2020 [employer]  

## 2020-08-13 ENCOUNTER — Telehealth: Payer: Self-pay

## 2020-08-13 ENCOUNTER — Encounter (HOSPITAL_COMMUNITY): Payer: Self-pay | Admitting: Surgery

## 2020-08-13 NOTE — Telephone Encounter (Signed)
Pt called saying she was recently discharged from [facility] and needs their phone number since she left her cane there.  I gave her the Main number to [place]., LPN

## 2020-08-18 ENCOUNTER — Other Ambulatory Visit: Payer: Self-pay

## 2020-08-18 ENCOUNTER — Encounter
Payer: Medicare Other | Attending: Physical Medicine and Rehabilitation | Admitting: Physical Medicine and Rehabilitation

## 2020-08-18 ENCOUNTER — Encounter: Payer: Self-pay | Admitting: Physical Medicine and Rehabilitation

## 2020-08-18 VITALS — BP 169/73 | HR 71 | Temp 98.7°F | Ht 62.0 in | Wt 143.0 lb

## 2020-08-18 DIAGNOSIS — E1142 Type 2 diabetes mellitus with diabetic polyneuropathy: Secondary | ICD-10-CM | POA: Diagnosis not present

## 2020-08-18 DIAGNOSIS — M545 Low back pain, unspecified: Secondary | ICD-10-CM

## 2020-08-18 DIAGNOSIS — G8929 Other chronic pain: Secondary | ICD-10-CM | POA: Insufficient documentation

## 2020-08-18 MED ORDER — CYCLOBENZAPRINE HCL 10 MG PO TABS: ORAL_TABLET | ORAL | 3 refills | Status: DC

## 2020-08-18 MED ORDER — GABAPENTIN 300 MG PO CAPS: 300.0000 mg | ORAL_CAPSULE | Freq: Three times a day (TID) | ORAL | 3 refills | Status: DC

## 2020-08-18 MED ORDER — DULOXETINE HCL 60 MG PO CPEP
60.0000 mg | ORAL_CAPSULE | Freq: Every day | ORAL | 3 refills | Status: DC
Start: 2020-08-18 — End: 2021-04-29

## 2020-08-18 MED ORDER — LIDOCAINE 5 % EX OINT
1.0000 | TOPICAL_OINTMENT | CUTANEOUS | 3 refills | Status: DC | PRN
Start: 2020-08-18 — End: 2022-04-07

## 2020-08-18 NOTE — Progress Notes (Signed)
Subjective:    Patient ID: [name] [name], female    DOB: December 22, 1946, 74 y.o.   MRN: [id number]  HPI   Patient is a 74 yr old female with hx of dCHF,  DM A1c of 6.7, COPD?, CLL with WBC >100k, and chronic back and R>>>L foot pain. Has chronic low back and RLE>LLE DM neuropathy.    Foot pain- burning, feels like it's on fire.  All the time- mainly in R foot- but occ in L.    Back pain- low back- right in the middle in the low back- doesn't radiate to the sides- doesn't radiate down legs.     Tried: tylenol- helps little/some- when has tramadol, tries together.   Dr Tarry Kos has prescribed tramadol.   Plans on quitting smoking- husband still smokes.  Planning on quitting January 1st 2022.  At least 6 cigarettes/day. About 1/3 ppd.  Not on O2 for COPD.     On:  Duloxetine 30 mg daily No side effects- never tried higher dose Gabapentin 300 mg BID - no side effects- no higher dose ever.     Uses a rollator for walking.    Keeps legs propped up at night and when takes a nap.  Helps the pain sometimes. And Sometimes it doesn't.    Probably takes Flexeril ~1x/day based on Rx having 30 tabs/month     Pain Inventory Average Pain 6 Pain Right Now 8 My pain is burning, tingling and aching  In the last 24 hours, has pain interfered with the following? General activity 6 Relation with others 0 Enjoyment of life 4 What TIME of day is your pain at its worst? morning  and evening Sleep (in general) Fair  Pain is worse with: walking, bending and sitting Pain improves with: medication Relief from Meds: 3  walk with assistance use a cane use a walker how many minutes can you walk? 5 ability to climb steps?  no do you drive?  yes  disabled: date disabled . I need assistance with the following:  bathing and household duties  weakness trouble walking  New Pt  New Pt    Family History  Problem Relation Age of Onset  . Heart disease Mother   . Asthma Mother    . Cancer Mother        uterine   . Depression Mother   . Heart attack Mother 56  . Hyperlipidemia Mother   . Hypertension Mother   . Stroke Mother   . Kidney disease Mother   . Heart attack Sister   . Stroke Sister   . Depression Sister   . Diabetes Sister   . Hyperlipidemia Sister   . Depression Sister   . Diabetes Sister   . Hyperlipidemia Sister   . HIV/AIDS Sister   . Diabetes Brother   . Asthma Brother   . Hypertension Brother   . Cancer Maternal Aunt        lung  . Heart disease Maternal Grandmother   . Heart attack Maternal Grandmother   . Cancer Brother        colon  . HIV/AIDS Brother   . Colon cancer Neg Hx    Social History   Socioeconomic History  . Marital status: Married    Spouse name: Not on file  . Number of children: 3  . Years of education: Not on file  . Highest education level: Not on file  Occupational History  . Occupation: DISABLED    Employer: DISABLED  Tobacco Use  . Smoking status: Current Some Day Smoker    Packs/day: 0.25    Years: 30.00    Pack years: 7.50    Types: Cigarettes    Start date: 01/01/1976  . Smokeless tobacco: Never Used  . 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 Vaping Use: Never used  Substance and Sexual Activity  . Alcohol use: No    Alcohol/week: 0.0 standard drinks  . Drug use: No  . Sexual activity: Yes  Other Topics Concern  . Not on file  Social History Narrative   Health Care POA:    Emergency Contact: Yenni Carra [phone] (c)   End of Life Plan:    Who lives with you: Lives with husband   Any pets: none   Diet: Patient lacks financial resources for much food. Pt reports eating what is available.   Exercise: Patient does not have an exercise plan.   Seatbelts: Patient reports wearing seatbelt when in vehicle.    [name] Exposure/Protection:   Hobbies: Bowling, computer games, Bingo      Has financial difficulties and transportation issues as she and her husband share  transportation       Social Determinants of Health   Financial Resource Strain:   . Difficulty of Paying Living Expenses: Not on file  Food Insecurity:   . Worried About Charity fundraiser in the Last Year: Not on file  . Ran Out of Food in the Last Year: Not on file  Transportation Needs:   . Lack of Transportation (Medical): Not on file  . Lack of Transportation (Non-Medical): Not on file  Physical Activity:   . Days of Exercise per Week: Not on file  . Minutes of Exercise per Session: Not on file  Stress:   . Feeling of Stress : Not on file  Social Connections:   . Frequency of Communication with Friends and Family: Not on file  . Frequency of Social Gatherings with Friends and Family: Not on file  . Attends Religious Services: Not on file  . Active Member of Clubs or Organizations: Not on file  . Attends Archivist Meetings: Not on file  . Marital Status: Not on file   Past Surgical History:  Procedure Laterality Date  . ABDOMINAL AORTOGRAM W/LOWER EXTREMITY Bilateral 08/12/2020   Procedure: ABDOMINAL AORTOGRAM W/BILATERAL LOWER EXTREMITY RUNOFF;  Surgeon: Serafina Mitchell, MD;  Location: [facility];  Service: Cardiovascular;  Laterality: Bilateral;  . ABI  02/2012   ABI <0.65 BL 02/2012  . BRAIN MENINGIOMA EXCISION    . Bypass grafting of RLE for PAD claudication     . CESAREAN SECTION     1974, 78 ,80  . CHOLECYSTECTOMY, LAPAROSCOPIC    . COLONOSCOPY  2014   [place], [place]  . ESOPHAGEAL DILATION     Past Medical History:  Diagnosis Date  . Allergy    environmental  . Asthma   . CLL (chronic lymphoblastic leukemia)   . Constipation 11/15/2011  . COPD (chronic obstructive pulmonary disease) ([place])   . Diabetes mellitus without complication ([place])   . GERD (gastroesophageal reflux disease)   . Hyperlipidemia   . Hypertension   . PVD (peripheral vascular disease) ([place])   . Stroke ([place]) 1998   in 1998 due to tumor-right side   BP (!) 169/73    Pulse 71   Temp 98.7 F (37.1 C)   Ht 5\' 2"  (1.575 m)   Wt 143  lb (64.9 kg)   SpO2 95%   BMI 26.16 kg/m   Opioid Risk Score:   Fall Risk Score:  `1  Depression screen PHQ 2/9  Depression screen [facility] 2/9 07/10/2020 06/16/2020 06/09/2020 05/15/2020 04/24/2020 03/03/2020 01/09/2020  Decreased Interest 0 0 0 0 0 0 0  Down, Depressed, Hopeless 0 0 0 0 0 0 0  PHQ - 2 Score 0 0 0 0 0 0 0  Tired, decreased energy - - - - - 0 -  Change in appetite - - - - - 0 -  Feeling bad or failure about yourself  - - - - - 0 -  Trouble concentrating - - - - - 0 -  Moving slowly or fidgety/restless - - - - - 0 -  Suicidal thoughts - - - - - 0 -  Some recent data might be hidden    Review of Systems  Musculoskeletal: Positive for gait problem.  Neurological: Positive for weakness.  All other systems reviewed and are negative.      Objective:   Physical Exam  MS: 5/5 in LEs except 4/5 in R DF and PF- due to pain??? Vs weakness or both Narrow band of pain in low back ~ L3-L5- and directly to the sides of midline- paraspinals just lateral to spine are affected as well.  Neuro: Sensation is vastly decreased from knee on R to toes- worse as you descend.  LLE is normal sensation      Assessment & Plan:   Patient is a 74 yr old female with hx of dCHF,  DM A1c of 6.7, COPD?, CLL with WBC >100k, and chronic back and R>>>L foot pain. Has chronic low back and RLE>LLE DM neuropathy.    1. Lidocaine cream up to 4x/day on feet R>L Can cover either 2 feet at a time OR low back and R foot- not all 3 at 1 time.  Is a numbing cream- pu ton before goes walking/does errands, etc.  There is a Aspercreme/Lidocaine patch over the counter- patch can last 12 hours- for bad days.  Sent to [facility].   2. Diabetic neuropathy gets worse FASTER when BG's aren't well controlled, so continue to keep them controlled, like they are.    3. Increase  Duloxetine to 60 mg daily- (2 capsules)should tolerate going up on dose  fine, so needs higher dose for nerve pain. Has FDA approval for back pain. Make the change immediately!   4. NEXT WEEK- increase gabapentin 300 mg 3x/day x 1 week, then 600 mg 2x/day for nerve pain- due to latest Cr (kidney function) of 1.4.   5. Wait to change to Lyrica/Pregabalin- unless needs in future.  Might need other nerve pain medicines.   6. Continue Cyclobenzaprine as needed for muscle relaxations. Sent to [facility] 10 mg up to 3x/day AS NEEDED_ but only get 30 tabs/month-   7. F/U in 6-8 weeks. Will see if can get controlled without Tramadol- might need it in the future.   I spent a total of 50 minutes on visit. As detailed above.

## 2020-08-18 NOTE — Patient Instructions (Signed)
  Patient is a 74 yr old female with hx of dCHF,  DM A1c of 6.7, COPD?, CLL with WBC >100k, and chronic back and R>>>L foot pain. Has chronic low back and RLE>LLE DM neuropathy.    1. Lidocaine cream up to 4x/day on feet R>L Can cover either 2 feet at a time OR low back and R foot- not all 3 at 1 time.  Is a numbing cream- pu ton before goes walking/does errands, etc.  There is a Aspercreme/Lidocaine patch over the counter- patch can last 12 hours- for bad days.  Sent to [place].   2. Diabetic neuropathy gets worse FASTER when BG's aren't well controlled, so continue to keep them controlled, like they are.    3. Increase  Duloxetine to 60 mg daily- (2 capsules)should tolerate going up on dose fine, so needs higher dose for nerve pain. Has FDA approval for back pain. Make the change immediately!   4. NEXT WEEK- increase gabapentin 300 mg 3x/day x 1 week, then 600 mg 2x/day for nerve pain- due to latest Cr (kidney function) of 1.4.   5. Wait to change to Lyrica/Pregabalin- unless needs in future.  Might need other nerve pain medicines.   6. Continue Cyclobenzaprine as needed for muscle relaxations. Sent to [facility] 10 mg up to 3x/day AS NEEDED_ but only get 30 tabs/month-   7. F/U in 6-8 weeks.

## 2020-08-22 ENCOUNTER — Other Ambulatory Visit: Payer: Self-pay | Admitting: Family Medicine

## 2020-08-27 ENCOUNTER — Other Ambulatory Visit: Payer: Self-pay | Admitting: Family Medicine

## 2020-08-27 ENCOUNTER — Telehealth: Payer: Self-pay

## 2020-08-27 NOTE — Telephone Encounter (Signed)
Pt called to schedule her f/u appt with studies. This has been sent to [facility] pool to schedule. I have let pt know to expect a phone call to schedule this.

## 2020-08-28 ENCOUNTER — Other Ambulatory Visit: Payer: Self-pay

## 2020-08-28 DIAGNOSIS — N1832 Chronic kidney disease, stage 3b: Secondary | ICD-10-CM

## 2020-08-28 DIAGNOSIS — I739 Peripheral vascular disease, unspecified: Secondary | ICD-10-CM

## 2020-09-04 ENCOUNTER — Other Ambulatory Visit: Payer: Self-pay | Admitting: Family Medicine

## 2020-09-08 ENCOUNTER — Ambulatory Visit (HOSPITAL_COMMUNITY)
Admission: RE | Admit: 2020-09-08 | Discharge: 2020-09-08 | Disposition: A | Discharge status: Home / Self Care | Payer: Medicare Other | Source: Ambulatory Visit | Attending: Surgery | Admitting: Surgery

## 2020-09-08 ENCOUNTER — Other Ambulatory Visit: Payer: Self-pay

## 2020-09-08 ENCOUNTER — Ambulatory Visit (INDEPENDENT_AMBULATORY_CARE_PROVIDER_SITE_OTHER)
Admission: RE | Admit: 2020-09-08 | Discharge: 2020-09-08 | Disposition: A | Discharge status: Home / Self Care | Payer: Medicare Other | Source: Ambulatory Visit | Attending: Surgery | Admitting: Surgery

## 2020-09-08 ENCOUNTER — Encounter: Payer: Self-pay | Admitting: Surgery

## 2020-09-08 ENCOUNTER — Ambulatory Visit (INDEPENDENT_AMBULATORY_CARE_PROVIDER_SITE_OTHER): Payer: Medicare Other | Admitting: Surgery

## 2020-09-08 VITALS — BP 168/81 | HR 71 | Temp 97.9°F | Resp 20 | Ht 62.0 in | Wt 140.0 lb

## 2020-09-08 DIAGNOSIS — N1832 Chronic kidney disease, stage 3b: Secondary | ICD-10-CM | POA: Diagnosis not present

## 2020-09-08 DIAGNOSIS — I739 Peripheral vascular disease, unspecified: Secondary | ICD-10-CM | POA: Diagnosis not present

## 2020-09-08 DIAGNOSIS — I70213 Atherosclerosis of native arteries of extremities with intermittent claudication, bilateral legs: Secondary | ICD-10-CM

## 2020-09-08 NOTE — Progress Notes (Signed)
[facility] [place]  Patient name: [name] [name] MRN: [id number] DOB: [birth year] Sex: female   REASON FOR VISIT:    Follow up  HISOTRY OF PRESENT ILLNESS:   [name] [name] a 74 y.o.female, who isback for follow up. In 2009 I took her for angiogram for claudication and she was found to have an 18 cm right superficial femoral artery occlusion which was successfully recanalized and stented. She then moved to [place]. She ultimately had a intervention in her left leg in [place] for claudication.  I last saw her in 2019.  At that time her symptoms were tolerable.  She had tried cilostazol but did not have any benefit.  She did not have any open wounds and so I elected to continue to monitor her.  She has had a worsening of her symptoms.  This is bordering on rest pain.  She is also worried about swelling in her right calf and ankle.  She has tried some type socks however this has not been of benefit.  She does not have any open wounds.  She underwent angiogram on August 18 an dis here to discuss surgery  The patient suffers from hypercholesterolemia which is managed with a statin. She is medically managed for hypertension. She is a current smoker. She does suffer from COPD.   PAST MEDICAL HISTORY:   Past Medical History:  Diagnosis Date  . Allergy    environmental  . Asthma   . CLL (chronic lymphoblastic leukemia)   . Constipation 11/15/2011  . COPD (chronic obstructive pulmonary disease) ([place])   . Diabetes mellitus without complication ([place])   . GERD (gastroesophageal reflux disease)   . Hyperlipidemia   . Hypertension   . PVD (peripheral vascular disease) ([place])   . Stroke ([place]) 1998   in 1998 due to tumor-right side     FAMILY HISTORY:   Family History  Problem Relation Age of Onset  . Heart disease Mother   . Asthma Mother   . Cancer Mother        uterine   . Depression Mother   . Heart  attack Mother 15  . Hyperlipidemia Mother   . Hypertension Mother   . Stroke Mother   . Kidney disease Mother   . Heart attack Sister   . Stroke Sister   . Depression Sister   . Diabetes Sister   . Hyperlipidemia Sister   . Depression Sister   . Diabetes Sister   . Hyperlipidemia Sister   . HIV/AIDS Sister   . Diabetes Brother   . Asthma Brother   . Hypertension Brother   . Cancer Maternal Aunt        lung  . Heart disease Maternal Grandmother   . Heart attack Maternal Grandmother   . Cancer Brother        colon  . HIV/AIDS Brother   . Colon cancer Neg Hx     SOCIAL HISTORY:   Social History   Tobacco Use  . Smoking status: Current Some Day Smoker    Packs/day: 0.25    Years: 30.00    Pack years: 7.50    Types: Cigarettes    Start date: 01/01/1976  . Smokeless tobacco: Never Used  . Tobacco comment: working on cutting back  Substance Use Topics  . Alcohol use: No    Alcohol/week: 0.0 standard drinks     ALLERGIES:   Allergies  Allergen Reactions  . No [employer] Food Allergies Diarrhea  and Nausea And Vomiting    Cabbage, stomach pain   . Aspirin Other (See Comments)    irritates stomach  . Baclofen Other (See Comments)    Stomach irritation     CURRENT MEDICATIONS:   Current Outpatient Medications  Medication Sig Dispense Refill  . acetaminophen (TYLENOL) 500 MG tablet Take 500-1,000 mg by mouth every 6 (six) hours as needed for moderate pain.     . albuterol (ACCUNEB) 0.63 MG/3ML nebulizer solution Inhale 1 vial via nebulizer every 6 hours for wheezing as needed (Patient taking differently: Take 1 ampule by nebulization every 6 (six) hours as needed for wheezing or shortness of breath. ) 75 mL 11  . albuterol (VENTOLIN HFA) 108 (90 Base) MCG/ACT inhaler Inhale 2 puffs by mouth every 6 hours as needed for wheezing or shortness of breath 8.5 g 11  . atorvastatin (LIPITOR) 40 MG tablet Take 1 tablet (40 mg total) by mouth daily. 90 tablet 3  . Blood  Glucose Monitoring Suppl (ONETOUCH VERIO) w/Device KIT 1 Device by Does not apply route daily. 1 kit 0  . BREO ELLIPTA 100-25 MCG/INH AEPB Inhale 1 puff by mouth every day (Patient taking differently: Inhale 1 puff into the lungs daily. ) 60 each 11  . clopidogrel (PLAVIX) 75 MG tablet Take 1 tablet by mouth every day (Patient taking differently: Take 75 mg by mouth daily. ) 30 tablet 11  . cyclobenzaprine (FLEXERIL) 10 MG tablet Take 1 tablet by mouth 3 times a day as needed for muscle spasm 90 tablet 3  . diclofenac sodium (VOLTAREN) 1 % GEL Apply 2 g topically 4 (four) times daily. 100 g 11  . DULoxetine (CYMBALTA) 60 MG capsule Take 1 capsule (60 mg total) by mouth daily. 90 capsule 3  . empagliflozin (JARDIANCE) 10 MG TABS tablet Take 1 tablet (10 mg total) by mouth daily. 30 tablet 11  . ergocalciferol (VITAMIN D2) 1.25 MG (50000 UT) capsule Take 50,000 Units by mouth every Monday.    . famotidine (PEPCID) 20 MG tablet Take 2 tablets by mouth every day (Patient taking differently: Take 40 mg by mouth daily. ) 60 tablet 11  . furosemide (LASIX) 20 MG tablet Take 1 tablet (20 mg total) by mouth daily. 30 tablet 3  . gabapentin (NEURONTIN) 300 MG capsule Take 1 capsule (300 mg total) by mouth 3 (three) times daily. X 1 week, then 600 mg BID- long term 600 mg 2x/day-  for nerve pain 360 capsule 3  . glucose blood (ONETOUCH VERIO) test strip Use to check blood sugar once a day 100 each 12  . hydrALAZINE (APRESOLINE) 50 MG tablet Take 50 mg by mouth 3 (three) times daily.    . Lancets (ONETOUCH ULTRASOFT) lancets Use to check blood sugar once a day 100 each 12  . lidocaine (XYLOCAINE) 5 % ointment Apply 1 application topically as needed. Apply to R>L feet when burning up to 4x/day as needed 50 g 3  . metFORMIN (GLUCOPHAGE) 500 MG tablet Take 1 tablet by mouth every day with breakfast. (Patient taking differently: Take 500 mg by mouth daily with breakfast. ) 30 tablet 11  . montelukast (SINGULAIR) 10  MG tablet Take 1 tablet by mouth at bedtime (Patient taking differently: Take 10 mg by mouth at bedtime. ) 30 tablet 11  . nitroGLYCERIN (NITROSTAT) 0.4 MG SL tablet Dissolve 1 tablet under tongue every 5 minutes, up to 3 doses for chest pain (Patient taking differently: Place 0.4 mg under the tongue   every 5 (five) minutes as needed for chest pain. up to 3 doses) 25 tablet 11  . traMADol (ULTRAM) 50 MG tablet Take 1 tablet (50 mg total) by mouth 2 (two) times daily as needed for moderate pain or severe pain. 30 tablet 5   No current facility-administered medications for this visit.    REVIEW OF SYSTEMS:   [X] denotes positive finding, [ ] denotes negative finding Cardiac  Comments:  Chest pain or chest pressure:    Shortness of breath upon exertion:    Short of breath when lying flat:    Irregular heart rhythm:        Vascular    Pain in calf, thigh, or hip brought on by ambulation: x   Pain in feet at night that wakes you up from your sleep:  x   Blood clot in your veins:    Leg swelling:         Pulmonary    Oxygen at home:    Productive cough:     Wheezing:         Neurologic    Sudden weakness in arms or legs:     Sudden numbness in arms or legs:     Sudden onset of difficulty speaking or slurred speech:    Temporary loss of vision in one eye:     Problems with dizziness:         Gastrointestinal    Blood in stool:     Vomited blood:         Genitourinary    Burning when urinating:     Blood in urine:        Psychiatric    Major depression:         Hematologic    Bleeding problems:    Problems with blood clotting too easily:        Skin    Rashes or ulcers:        Constitutional    Fever or chills:      PHYSICAL EXAM:   Vitals:   09/08/20 1032 09/08/20 1034  BP: (!) 165/80 (!) 168/81  Pulse: 71   Resp: 20   Temp: 97.9 F (36.6 C)   SpO2: 95%   Weight: 140 lb (63.5 kg)   Height: 5' 2" (1.575 m)     GENERAL: The patient is a well-nourished  female, in no acute distress. The vital signs are documented above. CARDIAC: There is a regular rate and rhythm.  VASCULAR: Nonpalpable pedal pulses PULMONARY: Non-labored respirations ABDOMEN: Soft and non-tender  MUSCULOSKELETAL: There are no major deformities or cyanosis. NEUROLOGIC: No focal weakness or paresthesias are detected. SKIN: There are no ulcers or rashes noted. PSYCHIATRIC: The patient has a normal affect.  STUDIES:   I have reviewed the following: +--------------+-----------+--------------------+--------------+-----------  ----+   RT Diameter RT Findings    GSV      LT Diameter  LT  Findings      (cm)                      (cm)             +--------------+-----------+--------------------+--------------+-----------  ----+     0.52           Saphenofemoral     0.48                             Junction                    +--------------+-----------+--------------------+--------------+-----------  ----+       0.35           Proximal thigh     0.38             +--------------+-----------+--------------------+--------------+-----------  ----+     0.33            Mid thigh      0.40      branching    +--------------+-----------+--------------------+--------------+-----------  ----+     0.31    branching   Distal thigh     0.30             +--------------+-----------+--------------------+--------------+-----------  ----+     0.26             Knee       0.30             +--------------+-----------+--------------------+--------------+-----------  ----+     0.21    branching    Prox calf      0.24             +--------------+-----------+--------------------+--------------+-----------  ----+     0.21             Mid calf      0.22    branching  and                                    tortuous    +--------------+-----------+--------------------+--------------+-----------  ----+     0.23           Distal calf      0.20             +--------------+-----------+--------------------+--------------+-----------  ----+    Carotid :  Right Carotid: Velocities in the right ICA are consistent with a 1-39%  stenosis.   Left Carotid: Velocities in the left ICA are consistent with a 40-59%  stenosis.  MEDICAL ISSUES:   PAD, right greater than left: The patient is bordering on rest pain in her right leg.  Her activities are severely restricted because of her immobility.  She desperately wants this addressed.  We discussed proceeding with a right femoral to below-knee popliteal artery bypass graft with vein.  We discussed potential risks and benefits including the risk of wound complications, premature graft failure, the need for surveillance, postoperative leg swelling.  All of her questions were answered.  She wants to proceed as soon as possible.  I will make sure she is off her Plavix and Pletal.  She will need cardiology evaluation to proceed with surgery.    Wells Brabham, IV, MD, FACS [facility] [place] Tel [phone] Pager [phone] 

## 2020-09-08 NOTE — H&P (View-Only) (Signed)
 [facility]  Patient name: [name] [name] MRN: [id number] DOB: 09/11/1946 Sex: female   REASON FOR VISIT:    Follow up  HISOTRY OF PRESENT ILLNESS:   [name] Sweeneyis a 74 y.o.female, who isback for follow up. In 2009 I took her for angiogram for claudication and she was found to have an 18 cm right superficial femoral artery occlusion which was successfully recanalized and stented. She then moved to [place]. She ultimately had a intervention in her left leg in [place] for claudication.  I last saw her in 2019.  At that time her symptoms were tolerable.  She had tried cilostazol but did not have any benefit.  She did not have any open wounds and so I elected to continue to monitor her.  She has had a worsening of her symptoms.  This is bordering on rest pain.  She is also worried about swelling in her right calf and ankle.  She has tried some type socks however this has not been of benefit.  She does not have any open wounds.  She underwent angiogram on August 18 an dis here to discuss surgery  The patient suffers from hypercholesterolemia which is managed with a statin. She is medically managed for hypertension. She is a current smoker. She does suffer from COPD.   PAST MEDICAL HISTORY:   Past Medical History:  Diagnosis Date  . Allergy    environmental  . Asthma   . CLL (chronic lymphoblastic leukemia)   . Constipation 11/15/2011  . COPD (chronic obstructive pulmonary disease) (HCC)   . Diabetes mellitus without complication (HCC)   . GERD (gastroesophageal reflux disease)   . Hyperlipidemia   . Hypertension   . PVD (peripheral vascular disease) (HCC)   . Stroke (HCC) 1998   in 1998 due to tumor-right side     FAMILY HISTORY:   Family History  Problem Relation Age of Onset  . Heart disease Mother   . Asthma Mother   . Cancer Mother        uterine   . Depression Mother   . Heart  attack Mother 72  . Hyperlipidemia Mother   . Hypertension Mother   . Stroke Mother   . Kidney disease Mother   . Heart attack Sister   . Stroke Sister   . Depression Sister   . Diabetes Sister   . Hyperlipidemia Sister   . Depression Sister   . Diabetes Sister   . Hyperlipidemia Sister   . HIV/AIDS Sister   . Diabetes Brother   . Asthma Brother   . Hypertension Brother   . Cancer Maternal Aunt        lung  . Heart disease Maternal Grandmother   . Heart attack Maternal Grandmother   . Cancer Brother        colon  . HIV/AIDS Brother   . Colon cancer Neg Hx     SOCIAL HISTORY:   Social History   Tobacco Use  . Smoking status: Current Some Day Smoker    Packs/day: 0.25    Years: 30.00    Pack years: 7.50    Types: Cigarettes    Start date: 01/01/1976  . Smokeless tobacco: Never Used  . Tobacco comment: working on cutting back  Substance Use Topics  . Alcohol use: No    Alcohol/week: 0.0 standard drinks     ALLERGIES:   Allergies  Allergen Reactions  . No [employer] Food Allergies Diarrhea   and Nausea And Vomiting    Cabbage, stomach pain   . Aspirin Other (See Comments)    irritates stomach  . Baclofen Other (See Comments)    Stomach irritation     CURRENT MEDICATIONS:   Current Outpatient Medications  Medication Sig Dispense Refill  . acetaminophen (TYLENOL) 500 MG tablet Take 500-1,000 mg by mouth every 6 (six) hours as needed for moderate pain.     . albuterol (ACCUNEB) 0.63 MG/3ML nebulizer solution Inhale 1 vial via nebulizer every 6 hours for wheezing as needed (Patient taking differently: Take 1 ampule by nebulization every 6 (six) hours as needed for wheezing or shortness of breath. ) 75 mL 11  . albuterol (VENTOLIN HFA) 108 (90 Base) MCG/ACT inhaler Inhale 2 puffs by mouth every 6 hours as needed for wheezing or shortness of breath 8.5 g 11  . atorvastatin (LIPITOR) 40 MG tablet Take 1 tablet (40 mg total) by mouth daily. 90 tablet 3  . Blood  Glucose Monitoring Suppl (ONETOUCH VERIO) w/Device KIT 1 Device by Does not apply route daily. 1 kit 0  . BREO ELLIPTA 100-25 MCG/INH AEPB Inhale 1 puff by mouth every day (Patient taking differently: Inhale 1 puff into the lungs daily. ) 60 each 11  . clopidogrel (PLAVIX) 75 MG tablet Take 1 tablet by mouth every day (Patient taking differently: Take 75 mg by mouth daily. ) 30 tablet 11  . cyclobenzaprine (FLEXERIL) 10 MG tablet Take 1 tablet by mouth 3 times a day as needed for muscle spasm 90 tablet 3  . diclofenac sodium (VOLTAREN) 1 % GEL Apply 2 g topically 4 (four) times daily. 100 g 11  . DULoxetine (CYMBALTA) 60 MG capsule Take 1 capsule (60 mg total) by mouth daily. 90 capsule 3  . empagliflozin (JARDIANCE) 10 MG TABS tablet Take 1 tablet (10 mg total) by mouth daily. 30 tablet 11  . ergocalciferol (VITAMIN D2) 1.25 MG (50000 UT) capsule Take 50,000 Units by mouth every Monday.    . famotidine (PEPCID) 20 MG tablet Take 2 tablets by mouth every day (Patient taking differently: Take 40 mg by mouth daily. ) 60 tablet 11  . furosemide (LASIX) 20 MG tablet Take 1 tablet (20 mg total) by mouth daily. 30 tablet 3  . gabapentin (NEURONTIN) 300 MG capsule Take 1 capsule (300 mg total) by mouth 3 (three) times daily. X 1 week, then 600 mg BID- long term 600 mg 2x/day-  for nerve pain 360 capsule 3  . glucose blood (ONETOUCH VERIO) test strip Use to check blood sugar once a day 100 each 12  . hydrALAZINE (APRESOLINE) 50 MG tablet Take 50 mg by mouth 3 (three) times daily.    . Lancets (ONETOUCH ULTRASOFT) lancets Use to check blood sugar once a day 100 each 12  . lidocaine (XYLOCAINE) 5 % ointment Apply 1 application topically as needed. Apply to R>L feet when burning up to 4x/day as needed 50 g 3  . metFORMIN (GLUCOPHAGE) 500 MG tablet Take 1 tablet by mouth every day with breakfast. (Patient taking differently: Take 500 mg by mouth daily with breakfast. ) 30 tablet 11  . montelukast (SINGULAIR) 10  MG tablet Take 1 tablet by mouth at bedtime (Patient taking differently: Take 10 mg by mouth at bedtime. ) 30 tablet 11  . nitroGLYCERIN (NITROSTAT) 0.4 MG SL tablet Dissolve 1 tablet under tongue every 5 minutes, up to 3 doses for chest pain (Patient taking differently: Place 0.4 mg under the tongue   every 5 (five) minutes as needed for chest pain. up to 3 doses) 25 tablet 11  . traMADol (ULTRAM) 50 MG tablet Take 1 tablet (50 mg total) by mouth 2 (two) times daily as needed for moderate pain or severe pain. 30 tablet 5   No current facility-administered medications for this visit.    REVIEW OF SYSTEMS:   [X] denotes positive finding, [ ] denotes negative finding Cardiac  Comments:  Chest pain or chest pressure:    Shortness of breath upon exertion:    Short of breath when lying flat:    Irregular heart rhythm:        Vascular    Pain in calf, thigh, or hip brought on by ambulation: x   Pain in feet at night that wakes you up from your sleep:  x   Blood clot in your veins:    Leg swelling:         Pulmonary    Oxygen at home:    Productive cough:     Wheezing:         Neurologic    Sudden weakness in arms or legs:     Sudden numbness in arms or legs:     Sudden onset of difficulty speaking or slurred speech:    Temporary loss of vision in one eye:     Problems with dizziness:         Gastrointestinal    Blood in stool:     Vomited blood:         Genitourinary    Burning when urinating:     Blood in urine:        Psychiatric    Major depression:         Hematologic    Bleeding problems:    Problems with blood clotting too easily:        Skin    Rashes or ulcers:        Constitutional    Fever or chills:      PHYSICAL EXAM:   Vitals:   09/08/20 1032 09/08/20 1034  BP: (!) 165/80 (!) 168/81  Pulse: 71   Resp: 20   Temp: 97.9 F (36.6 C)   SpO2: 95%   Weight: 140 lb (63.5 kg)   Height: 5' 2" (1.575 m)     GENERAL: The patient is a well-nourished  female, in no acute distress. The vital signs are documented above. CARDIAC: There is a regular rate and rhythm.  VASCULAR: Nonpalpable pedal pulses PULMONARY: Non-labored respirations ABDOMEN: Soft and non-tender  MUSCULOSKELETAL: There are no major deformities or cyanosis. NEUROLOGIC: No focal weakness or paresthesias are detected. SKIN: There are no ulcers or rashes noted. PSYCHIATRIC: The patient has a normal affect.  STUDIES:   I have reviewed the following: +--------------+-----------+--------------------+--------------+-----------  ----+   RT Diameter RT Findings    GSV      LT Diameter  LT  Findings      (cm)                      (cm)             +--------------+-----------+--------------------+--------------+-----------  ----+     0.52           Saphenofemoral     0.48                             Junction                    +--------------+-----------+--------------------+--------------+-----------  ----+       0.35           Proximal thigh     0.38             +--------------+-----------+--------------------+--------------+-----------  ----+     0.33            Mid thigh      0.40      branching    +--------------+-----------+--------------------+--------------+-----------  ----+     0.31    branching   Distal thigh     0.30             +--------------+-----------+--------------------+--------------+-----------  ----+     0.26             Knee       0.30             +--------------+-----------+--------------------+--------------+-----------  ----+     0.21    branching    Prox calf      0.24             +--------------+-----------+--------------------+--------------+-----------  ----+     0.21             Mid calf      0.22    branching  and                                    tortuous    +--------------+-----------+--------------------+--------------+-----------  ----+     0.23           Distal calf      0.20             +--------------+-----------+--------------------+--------------+-----------  ----+    Carotid :  Right Carotid: Velocities in the right ICA are consistent with a 1-39%  stenosis.   Left Carotid: Velocities in the left ICA are consistent with a 40-59%  stenosis.  MEDICAL ISSUES:   PAD, right greater than left: The patient is bordering on rest pain in her right leg.  Her activities are severely restricted because of her immobility.  She desperately wants this addressed.  We discussed proceeding with a right femoral to below-knee popliteal artery bypass graft with vein.  We discussed potential risks and benefits including the risk of wound complications, premature graft failure, the need for surveillance, postoperative leg swelling.  All of her questions were answered.  She wants to proceed as soon as possible.  I will make sure she is off her Plavix and Pletal.  She will need cardiology evaluation to proceed with surgery.    Wells Vonette Grosso, IV, MD, FACS [facility] Tel [phone] Pager [phone] 

## 2020-09-12 ENCOUNTER — Other Ambulatory Visit: Payer: Self-pay | Admitting: Family Medicine

## 2020-09-18 ENCOUNTER — Other Ambulatory Visit: Payer: Medicare Other

## 2020-09-23 NOTE — Progress Notes (Signed)
Your procedure is scheduled on Friday, October 8th..  Report to [facility] Main Entrance "A" at 5:30 A.M., and check in at the Admitting office.  Call this number if you have problems the morning of surgery:  [phone]  Call [phone] if you have any questions prior to your surgery date Monday-Friday 8am-4pm   Remember:  Do not eat or drink after midnight the night before your surgery   Take these medicines the morning of surgery with A SIP OF WATER  atorvastatin (LIPITOR)  BREO ELLIPTA/inhaler  DULoxetine (CYMBALTA) gabapentin (NEURONTIN)  hydrALAZINE (APRESOLINE)   If needed: acetaminophen (TYLENOL), albuterol (ACCUNEB)/nebulizer, cyclobenzaprine (FLEXERIL), nitroGLYCERIN (NITROSTAT), traMADol (ULTRAM)     albuterol (VENTOLIN HFA)/inhaler -bring with you the morning of surgery  Follow your surgeon's instructions on when to stop clopidogrel (PLAVIX).  If no instructions were given by your surgeon then you will need to call the office to get those instructions.    As of today, STOP taking any Aspirin (unless otherwise instructed by your surgeon) Aleve, Naproxen, Ibuprofen, Motrin, Advil, Goody's, BC's, all herbal medications, fish oil, and all vitamins.         WHAT DO I DO ABOUT MY DIABETES MEDICATION?  ---The morning of surgery--- Do NOT take empagliflozin (JARDIANCE) or metFORMIN (GLUCOPHAGE)  HOW TO MANAGE YOUR DIABETES BEFORE AND AFTER SURGERY  Why is it important to control my blood sugar before and after surgery? . Improving blood sugar levels before and after surgery helps healing and can limit problems. . A way of improving blood sugar control is eating a healthy diet by: o  Eating less sugar and carbohydrates o  Increasing activity/exercise o  Talking with your doctor about reaching your blood sugar goals . High blood sugars (greater than 180 mg/dL) can raise your risk of infections and slow your recovery, so you will need to focus on controlling your diabetes  during the weeks before surgery. . Make sure that the doctor who takes care of your diabetes knows about your planned surgery including the date and location.  How do I manage my blood sugar before surgery? . Check your blood sugar at least 4 times a day, starting 2 days before surgery, to make sure that the level is not too high or low. . Check your blood sugar the morning of your surgery when you wake up and every 2 hours until you get to the Short Stay unit. o If your blood sugar is less than 70 mg/dL, you will need to treat for low blood sugar: - Do not take insulin. - Treat a low blood sugar (less than 70 mg/dL) with  cup of clear juice (cranberry or apple), 4 glucose tablets, OR glucose gel. - Recheck blood sugar in 15 minutes after treatment (to make sure it is greater than 70 mg/dL). If your blood sugar is not greater than 70 mg/dL on recheck, call [phone] for further instructions. . Report your blood sugar to the short stay nurse when you get to Short Stay.  . If you are admitted to the hospital after surgery: o Your blood sugar will be checked by the staff and you will probably be given insulin after surgery (instead of oral diabetes medicines) to make sure you have good blood sugar levels. o The goal for blood sugar control after surgery is 80-180 mg/dL.             Do not wear jewelry, make up, or nail polish  Do not wear lotions, powders, perfumes/colognes, or deodorant.            Do not shave 48 hours prior to surgery.  Men may shave face and neck.            Do not bring valuables to the hospital.            [facility] is not responsible for any belongings or valuables.  Do NOT Smoke (Tobacco/Vaping) or drink Alcohol 24 hours prior to your procedure If you use a CPAP at night, you may bring all equipment for your overnight stay.   Contacts, glasses, dentures or bridgework may not be worn into surgery.      For patients admitted to the hospital, discharge  time will be determined by your treatment team.   Patients discharged the day of surgery will not be allowed to drive home, and someone needs to stay with them for 24 hours.  Special instructions:   [facility]- Preparing For Surgery  Before surgery, you can play an important role. Because skin is not sterile, your skin needs to be as free of germs as possible. You can reduce the number of germs on your skin by washing with CHG (chlorahexidine gluconate) Soap before surgery.  CHG is an antiseptic cleaner which kills germs and bonds with the skin to continue killing germs even after washing.    Oral Hygiene is also important to reduce your risk of infection.  Remember - BRUSH YOUR TEETH THE MORNING OF SURGERY WITH YOUR REGULAR TOOTHPASTE  Please do not use if you have an allergy to CHG or antibacterial soaps. If your skin becomes reddened/irritated stop using the CHG.  Do not shave (including legs and underarms) for at least 48 hours prior to first CHG shower. It is OK to shave your face.  Please follow these instructions carefully.   1. Shower the NIGHT BEFORE SURGERY and the MORNING OF SURGERY with CHG Soap.   2. If you chose to wash your hair, wash your hair first as usual with your normal shampoo.  3. After you shampoo, rinse your hair and body thoroughly to remove the shampoo.  4. Use CHG as you would any other liquid soap. You can apply CHG directly to the skin and wash gently with a scrungie or a clean washcloth.   5. Apply the CHG Soap to your body ONLY FROM THE NECK DOWN.  Do not use on open wounds or open sores. Avoid contact with your eyes, ears, mouth and genitals (private parts). Wash Face and genitals (private parts)  with your normal soap.   6. Wash thoroughly, paying special attention to the area where your surgery will be performed.  7. Thoroughly rinse your body with warm water from the neck down.  8. DO NOT shower/wash with your normal soap after using and rinsing off  the CHG Soap.  9. Pat yourself dry with a CLEAN TOWEL.  10. Wear CLEAN PAJAMAS to bed the night before surgery  11. Place CLEAN SHEETS on your bed the night of your first shower and DO NOT SLEEP WITH PETS.  Day of Surgery: Wear Clean/Comfortable clothing the morning of surgery Do not apply any deodorants/lotions.   Remember to brush your teeth WITH YOUR REGULAR TOOTHPASTE.   Please read over the following fact sheets that you were given.

## 2020-09-24 ENCOUNTER — Telehealth: Payer: Self-pay

## 2020-09-24 ENCOUNTER — Encounter (HOSPITAL_COMMUNITY)
Admission: RE | Admit: 2020-09-24 | Discharge: 2020-09-24 | Disposition: A | Discharge status: Home / Self Care | Payer: Medicare Other | Source: Ambulatory Visit | Attending: Surgery | Admitting: Surgery

## 2020-09-24 ENCOUNTER — Other Ambulatory Visit: Payer: Self-pay

## 2020-09-24 ENCOUNTER — Encounter (HOSPITAL_COMMUNITY): Payer: Self-pay

## 2020-09-24 DIAGNOSIS — Z01812 Encounter for preprocedural laboratory examination: Secondary | ICD-10-CM | POA: Diagnosis not present

## 2020-09-24 HISTORY — DX: Chronic kidney disease, unspecified: N18.9

## 2020-09-24 HISTORY — DX: Cardiac murmur, unspecified: R01.1

## 2020-09-24 HISTORY — DX: Headache, unspecified: R51.9

## 2020-09-24 LAB — URINALYSIS, ROUTINE W REFLEX MICROSCOPIC
Bacteria, UA: NONE SEEN
Bilirubin Urine: NEGATIVE
Glucose, UA: >=500 mg/dL — AB
Ketones, ur: NEGATIVE mg/dL
Leukocytes,Ua: NEGATIVE
Nitrite: NEGATIVE
Protein, ur: NEGATIVE mg/dL
Specific Gravity, Urine: 1.023 (ref 1.005–1.030)
pH: 5 (ref 5.0–8.0)

## 2020-09-24 LAB — COMPREHENSIVE METABOLIC PANEL
ALT: 26 U/L (ref 0–44)
AST: 33 U/L (ref 15–41)
Albumin: 3.9 g/dL (ref 3.5–5.0)
Alkaline Phosphatase: 183 U/L — ABNORMAL HIGH (ref 38–126)
Anion gap: 10 (ref 5–15)
BUN: 13 mg/dL (ref 8–23)
CO2: 28 mmol/L (ref 22–32)
Calcium: 9.1 mg/dL (ref 8.9–10.3)
Chloride: 101 mmol/L (ref 98–111)
Creatinine, Ser: 1.72 mg/dL — ABNORMAL HIGH (ref 0.44–1.00)
GFR calc Af Amer: 34 mL/min — ABNORMAL LOW (ref >60)
GFR calc non Af Amer: 29 mL/min — ABNORMAL LOW (ref >60)
Glucose, Bld: 124 mg/dL — ABNORMAL HIGH (ref 70–99)
Potassium: 5.7 mmol/L — ABNORMAL HIGH (ref 3.5–5.1)
Sodium: 139 mmol/L (ref 135–145)
Total Bilirubin: 0.6 mg/dL (ref 0.3–1.2)
Total Protein: 6.7 g/dL (ref 6.5–8.1)

## 2020-09-24 LAB — CBC
HCT: 38.4 % (ref 36.0–46.0)
Hemoglobin: 11 g/dL — ABNORMAL LOW (ref 12.0–15.0)
MCH: 25.9 pg — ABNORMAL LOW (ref 26.0–34.0)
MCHC: 28.6 g/dL — ABNORMAL LOW (ref 30.0–36.0)
MCV: 90.4 fL (ref 80.0–100.0)
Platelets: 229 10*3/uL (ref 150–400)
RBC: 4.25 MIL/uL (ref 3.87–5.11)
RDW: 20.4 % — ABNORMAL HIGH (ref 11.5–15.5)
WBC: 164.6 10*3/uL (ref 4.0–10.5)
nRBC: 0.1 % (ref 0.0–0.2)

## 2020-09-24 LAB — DIFFERENTIAL
Abs Immature Granulocytes: 0.66 10*3/uL — ABNORMAL HIGH (ref 0.00–0.07)
Basophils Absolute: 0.3 10*3/uL — ABNORMAL HIGH (ref 0.0–0.1)
Basophils Relative: 0 %
Eosinophils Absolute: 0.3 10*3/uL (ref 0.0–0.5)
Eosinophils Relative: 0 %
Immature Granulocytes: 0 %
Lymphocytes Relative: 75 %
Lymphs Abs: 121.7 10*3/uL — ABNORMAL HIGH (ref 0.7–4.0)
Monocytes Absolute: 33.4 10*3/uL — ABNORMAL HIGH (ref 0.1–1.0)
Monocytes Relative: 20 %
Neutro Abs: 7.8 10*3/uL — ABNORMAL HIGH (ref 1.7–7.7)
Neutrophils Relative %: 5 %

## 2020-09-24 LAB — TYPE AND SCREEN
ABO/RH(D): A POS
Antibody Screen: NEGATIVE

## 2020-09-24 LAB — PROTIME-INR
INR: 0.9 (ref 0.8–1.2)
Prothrombin Time: 11.9 seconds (ref 11.4–15.2)

## 2020-09-24 LAB — HEMOGLOBIN A1C
Hgb A1c MFr Bld: 7.4 % — ABNORMAL HIGH (ref 4.8–5.6)
Mean Plasma Glucose: 165.68 mg/dL

## 2020-09-24 LAB — SURGICAL PCR SCREEN
MRSA, PCR: NEGATIVE
Staphylococcus aureus: POSITIVE — AB

## 2020-09-24 LAB — APTT: aPTT: 27 seconds (ref 24–36)

## 2020-09-24 LAB — GLUCOSE, CAPILLARY: Glucose-Capillary: 114 mg/dL — ABNORMAL HIGH (ref 70–99)

## 2020-09-24 NOTE — Progress Notes (Signed)
PCP - Danna Hefty, DO Cardiologist - [place]. Radford Pax, MD  PPM/ICD - Denies  Chest x-ray - N/A EKG - 06/19/20 Stress Test - 08/16/821 ECHO - 05/16/2020 Cardiac Cath - 04/09//08  Sleep Study - Denies  Fasting Blood Sugar: low 100s Checks Blood Sugar 2 times a day  Blood Thinner Instructions: Stop Plavix 5 days before surgery. Aspirin Instructions: N/A  ERAS Protcol - N/A PRE-SURGERY Ensure or G2- N/A  COVID TEST- 10/02/20   Anesthesia review: Yes, cardiac hx.  Patient denies shortness of breath, fever, cough and chest pain at PAT appointment   All instructions explained to the patient, with a verbal understanding of the material. Patient agrees to go over the instructions while at home for a better understanding. Patient also instructed to self quarantine after being tested for COVID-19. The opportunity to ask questions was provided.

## 2020-09-24 NOTE — Progress Notes (Signed)
Abnormal potassium of 5.7, WBC of 164.6, and abnormal U/A reported to [facility] with Dr. Stephens Shire office.

## 2020-09-24 NOTE — Progress Notes (Signed)
Your procedure is scheduled on Friday, October 8th, from 07:30 AM- 11:39 AM.  Report to [facility] Main Entrance "A" at 5:30 A.M., and check in at the Admitting office.  Call this number if you have problems the morning of surgery:  [phone]  Call [phone] if you have any questions prior to your surgery date Monday-Friday 8am-4pm.   Remember:  Do not eat or drink after midnight the night before your surgery.   Take these medicines the morning of surgery with A SIP OF WATER:      atorvastatin (LIPITOR)  BREO ELLIPTA/inhaler DULoxetine (CYMBALTA) gabapentin (NEURONTIN)      hydrALAZINE (APRESOLINE)     famotidine (PEPCID)   If needed:     acetaminophen (TYLENOL)    albuterol (ACCUNEB) nebulizer    cyclobenzaprine (FLEXERIL)    nitroGLYCERIN (NITROSTAT)    traMADol (ULTRAM)        albuterol (VENTOLIN HFA) inhaler -bring with you the morning of surgery  *STOP PLAVIX 5 DAYS PRIOR TO SURGERY  As of today, STOP taking any Aspirin (unless otherwise instructed by your surgeon) Aleve, Naproxen, Ibuprofen, Motrin, Advil, Goody's, BC's, all herbal medications, fish oil, and all vitamins.          WHAT DO I DO ABOUT MY DIABETES MEDICATION?  ---THE DAY BEFORE SURGERY--- Do NOT take empagliflozin (JARDIANCE)  ---THE MORNING OF SURGERY--- Do NOT take empagliflozin (JARDIANCE) or metFORMIN (GLUCOPHAGE)   HOW TO MANAGE YOUR DIABETES BEFORE AND AFTER SURGERY  Why is it important to control my blood sugar before and after surgery? . Improving blood sugar levels before and after surgery helps healing and can limit problems. . A way of improving blood sugar control is eating a healthy diet by: o  Eating less sugar and carbohydrates o  Increasing activity/exercise o  Talking with your doctor about reaching your blood sugar goals . High blood sugars (greater than 180 mg/dL) can raise your risk of infections and slow your recovery, so you will need to focus on controlling your  diabetes during the weeks before surgery. . Make sure that the doctor who takes care of your diabetes knows about your planned surgery including the date and location.  How do I manage my blood sugar before surgery? . Check your blood sugar at least 4 times a day, starting 2 days before surgery, to make sure that the level is not too high or low. . Check your blood sugar the morning of your surgery when you wake up and every 2 hours until you get to the Short Stay unit. o If your blood sugar is less than 70 mg/dL, you will need to treat for low blood sugar: - Do not take insulin. - Treat a low blood sugar (less than 70 mg/dL) with  cup of clear juice (cranberry or apple), 4 glucose tablets, OR glucose gel. - Recheck blood sugar in 15 minutes after treatment (to make sure it is greater than 70 mg/dL). If your blood sugar is not greater than 70 mg/dL on recheck, call [phone] for further instructions. . Report your blood sugar to the short stay nurse when you get to Short Stay.  . If you are admitted to the hospital after surgery: o Your blood sugar will be checked by the staff and you will probably be given insulin after surgery (instead of oral diabetes medicines) to make sure you have good blood sugar levels. o The goal for blood sugar control after surgery is 80-180 mg/dL.   The Morning  of Surgery:            Do not wear jewelry, make up, or nail polish.            Do not wear lotions, powders, perfumes, or deodorant.            Do not shave 48 hours prior to surgery.             Do not bring valuables to the hospital.            [facility] is not responsible for any belongings or valuables.  Do NOT Smoke (Tobacco/Vaping) or drink Alcohol 24 hours prior to your procedure.  If you use a CPAP at night, you may bring all equipment for your overnight stay.   Contacts, glasses, dentures or bridgework may not be worn into surgery.      For patients admitted to the hospital, discharge  time will be determined by your treatment team.   Patients discharged the day of surgery will not be allowed to drive home, and someone needs to stay with them for 24 hours.  Special instructions:   [facility]- Preparing For Surgery  Before surgery, you can play an important role. Because skin is not sterile, your skin needs to be as free of germs as possible. You can reduce the number of germs on your skin by washing with CHG (chlorahexidine gluconate) Soap before surgery.  CHG is an antiseptic cleaner which kills germs and bonds with the skin to continue killing germs even after washing.    Oral Hygiene is also important to reduce your risk of infection.  Remember - BRUSH YOUR TEETH THE MORNING OF SURGERY WITH YOUR REGULAR TOOTHPASTE  Please do not use if you have an allergy to CHG or antibacterial soaps. If your skin becomes reddened/irritated stop using the CHG.  Do not shave (including legs and underarms) for at least 48 hours prior to first CHG shower. It is OK to shave your face.  Please follow these instructions carefully.   1. Shower the NIGHT BEFORE SURGERY and the MORNING OF SURGERY with CHG Soap.   2. If you chose to wash your hair, wash your hair first as usual with your normal shampoo.  3. After you shampoo, rinse your hair and body thoroughly to remove the shampoo.  4. Use CHG as you would any other liquid soap. You can apply CHG directly to the skin and wash gently with a scrungie or a clean washcloth.   5. Apply the CHG Soap to your body ONLY FROM THE NECK DOWN.  Do not use on open wounds or open sores. Avoid contact with your eyes, ears, mouth and genitals (private parts). Wash Face and genitals (private parts)  with your normal soap.   6. Wash thoroughly, paying special attention to the area where your surgery will be performed.  7. Thoroughly rinse your body with warm water from the neck down.  8. DO NOT shower/wash with your normal soap after using and rinsing off  the CHG Soap.  9. Pat yourself dry with a CLEAN TOWEL.  10. Wear CLEAN PAJAMAS to bed the night before surgery  11. Place CLEAN SHEETS on your bed the night of your first shower and DO NOT SLEEP WITH PETS.  Day of Surgery: SHOWER Wear Clean/Comfortable clothing the morning of surgery Do not apply any deodorants/lotions.   Remember to brush your teeth WITH YOUR REGULAR TOOTHPASTE.   Please read over the following fact sheets  that you were given.

## 2020-09-24 NOTE — Telephone Encounter (Signed)
Pre-Op nurse Altha Harm called to report abnormal lab values k - 5.7, WBC - 164.6, and a urine glucose > 500. Sent message to MD. Altha Harm aware

## 2020-09-25 ENCOUNTER — Other Ambulatory Visit: Payer: Medicare Other

## 2020-09-25 ENCOUNTER — Encounter (HOSPITAL_COMMUNITY): Payer: Self-pay

## 2020-09-25 LAB — PATHOLOGIST SMEAR REVIEW

## 2020-09-25 NOTE — Progress Notes (Addendum)
Anesthesia Chart Review:  Case: [id number] Date/Time: 10/03/20 0715   Procedure: RIGHT FEMORAL- BELOW KNEE POPLITEAL BYPASS WITH VEIN (Right )   Anesthesia type: General   Pre-op diagnosis: ATHEROSCLEROSIS OF NATIVE ARTERY OF BOTH LOWER EXTREMITIES WITH INTERMITTENT CLAUDICATION   Location: [facility] OR ROOM 12 / [place] OR   Surgeons: Serafina Mitchell, MD      DISCUSSION: Patient is a 74 year old female scheduled for the above procedure.  History includes smoking, CLL (Rai 1 CLL, Trisomy 12 mutation, diagnosed 11/2017), COPD, HLD, HTN, PVD (s/p right SFA and popliteal stents 12/29/07), DM2, murmur, asthma, pulmonary nodules (innumerable tiny centrilobular nodules, likely smoking-related bronchiolitis, stable 03/25/20 CT), GERD, CKD, CVA (post left parietal craniotomy for resection of meningioma 1998), migraines.   Patient was evaluated by cardiologist Dr. Radford Pax on 08/07/20 as a new patient regarding atypical chest pain, PVD, and diastolic CHF. Given symptoms and co-morbidities, Carlton Adam was ordered which was normal on 08/11/20. Preoperative cardiac risk assessment outlined on 09/26/20 by Coletta Memos, NP, "...Given past medical history and time since last visit, based on ACC/AHA guidelines, [name] [name] would be at acceptable risk for the planned procedure without further cardiovascular testing.   Patient was advised that if she develops new symptoms prior to surgery to contact our office to arrange a follow-up appointment."  She has known CLL, but WBC up to 164K (from 117K last month). CBC routed to Dr. Irene Limbo on 09/25/20, and I discussed WBC results with anesthesiologist Albertha Ghee, MD. It also appears that Dr. Trula Slade has reached out to Dr. Irene Limbo. Her Creatinine (1.72) appears to be on the higher end of her baseline. Her K 5.7, so if not repeated prior to the day of surgery then will order an ISTAT8 re-evaluate hyperkalemia and Cr stability. ALT and Lipid panel were done per Dr. Radford Pax on 09/26/20 with normal  ALT. Atorvastatin dose increased since LDL not yet at goal.     Per [facility] She is to hold Plavix and Pletal for 5 days prior to surgery (last dose 09/27/20), although I do not see Pletal on her current medication list (notes indicate that she had tried but did not see any benefit with cilostazol).   Presurgical COVID-19 test is scheduled for 10/02/20. As above, if potassium not repeated then will get ISTAT in [place]. Anesthesia team to evaluate on the day of surgery. (UPDATE 09/30/20 10:24 AM: Dr. Irene Limbo noted routed CBC results. He responded, "She has gradually progressive CLL-- has not met treatment criteria for CLL at this time.")   VS: BP (!) 162/76   Pulse 78   Temp 36.8 C (Oral)   Resp 18   Ht '5\' 2"'  (1.575 m)   Wt 64.1 kg   SpO2 95%   BMI 25.86 kg/m    PROVIDERS: Danna Hefty, DO is PCP ([place]). Last visit 07/10/20.  Fransico Him, MD is cardiologist - Sullivan Lone, MD is HEM-ONC. Last evaluation 08/05/20. Blood counts stable at that time, no lab or clinical evidence of CLL progression. 4 month follow-up planned.  Harrie Jeans, MD is nephrologist - Kristeen Miss MD is neurosurgeon    LABS: Preoperative labs noted. WBC 164K, up from 117K on 08/05/20 (WBC ~ 95-117K since 12/2019). Cr 1.72, previously ~ 1.4-1.7 since 12/2019. K 5.7, without mention of hemolysis. See DISCUSSION. (all labs ordered are listed, but only abnormal results are displayed)  Labs Reviewed  SURGICAL PCR SCREEN - Abnormal; Notable for the following components:  Result Value   Staphylococcus aureus POSITIVE (*)    All other components within normal limits  GLUCOSE, CAPILLARY - Abnormal; Notable for the following components:   Glucose-Capillary 114 (*)    All other components within normal limits  CBC - Abnormal; Notable for the following components:   WBC 164.6 (*)    Hemoglobin 11.0 (*)    MCH 25.9 (*)    MCHC 28.6 (*)    RDW 20.4 (*)    All other components within normal  limits  COMPREHENSIVE METABOLIC PANEL - Abnormal; Notable for the following components:   Potassium 5.7 (*)    Glucose, Bld 124 (*)    Creatinine, Ser 1.72 (*)    Alkaline Phosphatase 183 (*)    GFR calc non Af Amer 29 (*)    GFR calc Af Amer 34 (*)    All other components within normal limits  URINALYSIS, ROUTINE W REFLEX MICROSCOPIC - Abnormal; Notable for the following components:   Glucose, UA >=500 (*)    Hgb urine dipstick SMALL (*)    All other components within normal limits  HEMOGLOBIN A1C - Abnormal; Notable for the following components:   Hgb A1c MFr Bld 7.4 (*)    All other components within normal limits  DIFFERENTIAL - Abnormal; Notable for the following components:   Neutro Abs 7.8 (*)    Lymphs Abs 121.7 (*)    Monocytes Absolute 33.4 (*)    Basophils Absolute 0.3 (*)    Abs Immature Granulocytes 0.66 (*)    All other components within normal limits  APTT  PROTIME-INR  PATHOLOGIST SMEAR REVIEW  TYPE AND SCREEN     IMAGES: CXR 06/19/20: FINDINGS: Very mild diffusely increased interstitial lung markings are seen. There is no evidence of focal consolidation, pleural effusion or pneumothorax. The heart size and mediastinal contours are within normal limits. The visualized skeletal structures are unremarkable. IMPRESSION: Very mild diffusely increased interstitial lung markings, which may be chronic in nature.  CT chest/abd/pelvis 03/25/20: IMPRESSION: 1. No noncontrast evidence of acute traumatic injury within the chest, abdomen or pelvis. 2. No significant change in enlarged bilateral axillary and subpectoral lymph nodes or an enlarged right iliac lymph node. No new lymphadenopathy in the chest, abdomen, or pelvis. 3. Emphysema (ICD10-J43.9).  4. Innumerable tiny centrilobular pulmonary nodules, likely smoking-related respiratory bronchiolitis. Occasional additional stable, benign small pulmonary nodules. 5. Coronary artery disease.  Aortic  Atherosclerosis (ICD10-I70.0)   EKG: 06/19/20: Normal sinus rhythm Nonspecific ST and T wave abnormality Abnormal ECG No significant change since prior 3/20 Confirmed by Aletta Edouard [phone]) on 06/19/2020 6:53:00 PM   CV: Carotid US 09/08/20: Summary:  Right Carotid: Velocities in the right ICA are consistent with a 1-39%  stenosis.  Left Carotid: Velocities in the left ICA are consistent with a 40-59%  stenosis.    Aortogram with BLE runoff 08/12/20: Impression:             #1 widely patent bilateral common and external iliac artery stents             #2 occluded left superficial femoral artery with reconstitution of the above-knee popliteal artery and three-vessel runoff.  There is also a distal common femoral stenosis on the left.             #3 distal right common femoral artery stenosis with occluded superficial femoral artery and reconstitution of a diseased above-knee popliteal artery.  The best distal target for bypass would be the below-knee popliteal artery  with three-vessel runoff.   Nuclear stress test 08/11/20:  Nuclear stress EF: 63%.  There was no ST segment deviation noted during stress.  The study is normal.  This is a low risk study.  The left ventricular ejection fraction is normal (55-65%). Normal stress nuclear study with no ischemia or infarction.  Gated ejection fraction 63% with normal wall motion.   Echo 05/16/20: IMPRESSIONS  1. Left ventricular ejection fraction, by estimation, is 70 to 75%. The  left ventricle has hyperdynamic function. The left ventricle has no  regional wall motion abnormalities. There is moderate concentric left  ventricular hypertrophy. Left ventricular  diastolic parameters are indeterminate. Elevated left ventricular  end-diastolic pressure.  2. Right ventricular systolic function is normal. The right ventricular  size is normal. There is normal pulmonary artery systolic pressure.  3. Left atrial size was mildly  dilated.  4. The mitral valve is normal in structure. Trivial mitral valve  regurgitation. No evidence of mitral stenosis.  5. The aortic valve has an indeterminant number of cusps. Aortic valve  regurgitation is mild. Mild aortic valve stenosis.  6. The inferior vena cava is normal in size with greater than 50%  respiratory variability, suggesting right atrial pressure of 3 mmHg.    Cardiac cath 04/05/07: IMPRESSION:  1. Minimal coronary artery disease.  2. Normal left systolic function.   Past Medical History:  Diagnosis Date  . Allergy    environmental  . Asthma   . CKD (chronic kidney disease)   . CLL (chronic lymphoblastic leukemia)   . Constipation 11/15/2011  . COPD (chronic obstructive pulmonary disease) ([place])   . Diabetes mellitus without complication ([place])   . GERD (gastroesophageal reflux disease)   . Headache    migraines  . Heart murmur   . Hyperlipidemia   . Hypertension   . PVD (peripheral vascular disease) ([place])   . Stroke [facility]) 1998   in 1998 due to tumor-right side    Past Surgical History:  Procedure Laterality Date  . ABDOMINAL AORTOGRAM W/LOWER EXTREMITY Bilateral 08/12/2020   Procedure: ABDOMINAL AORTOGRAM W/BILATERAL LOWER EXTREMITY RUNOFF;  Surgeon: Serafina Mitchell, MD;  Location: [facility];  Service: Cardiovascular;  Laterality: Bilateral;  . ABI  02/2012   ABI <0.65 BL 02/2012  . BRAIN MENINGIOMA EXCISION    . Bypass grafting of RLE for PAD claudication     . CARDIAC CATHETERIZATION  04/09/208  . CESAREAN SECTION     1974, 77 ,79  . CHOLECYSTECTOMY, LAPAROSCOPIC    . COLONOSCOPY  2014   [place], [place]  . ESOPHAGEAL DILATION      MEDICATIONS: . acetaminophen (TYLENOL) 500 MG tablet  . albuterol (ACCUNEB) 0.63 MG/3ML nebulizer solution  . albuterol (VENTOLIN HFA) 108 (90 Base) MCG/ACT inhaler  . atorvastatin (LIPITOR) 40 MG tablet  . Blood Glucose Monitoring Suppl (ONETOUCH VERIO) w/Device KIT  . BREO ELLIPTA 100-25 MCG/INH  AEPB  . clopidogrel (PLAVIX) 75 MG tablet  . cyclobenzaprine (FLEXERIL) 10 MG tablet  . diclofenac sodium (VOLTAREN) 1 % GEL  . DULoxetine (CYMBALTA) 60 MG capsule  . empagliflozin (JARDIANCE) 10 MG TABS tablet  . ergocalciferol (VITAMIN D2) 1.25 MG (50000 UT) capsule  . famotidine (PEPCID) 20 MG tablet  . furosemide (LASIX) 20 MG tablet  . gabapentin (NEURONTIN) 300 MG capsule  . glucose blood (ONETOUCH VERIO) test strip  . hydrALAZINE (APRESOLINE) 50 MG tablet  . Lancets (ONETOUCH ULTRASOFT) lancets  . lidocaine (XYLOCAINE) 5 % ointment  . metFORMIN (GLUCOPHAGE)  500 MG tablet  . montelukast (SINGULAIR) 10 MG tablet  . nitroGLYCERIN (NITROSTAT) 0.4 MG SL tablet  . traMADol (ULTRAM) 50 MG tablet   No current facility-administered medications for this encounter.    Myra Gianotti, PA-C Surgical Short Stay/Anesthesiology [facility] Phone [phone] [facility] Phone [phone] 09/29/2020 2:41 PM

## 2020-09-26 ENCOUNTER — Other Ambulatory Visit: Payer: Medicare Other | Admitting: *Deleted

## 2020-09-26 ENCOUNTER — Telehealth: Payer: Self-pay | Admitting: Cardiology

## 2020-09-26 ENCOUNTER — Other Ambulatory Visit: Payer: Self-pay

## 2020-09-26 DIAGNOSIS — E7800 Pure hypercholesterolemia, unspecified: Secondary | ICD-10-CM

## 2020-09-26 LAB — LIPID PANEL
Chol/HDL Ratio: 4.5 ratio — ABNORMAL HIGH (ref 0.0–4.4)
Cholesterol, Total: 159 mg/dL (ref 100–199)
HDL: 35 mg/dL — ABNORMAL LOW (ref >39)
LDL Chol Calc (NIH): 91 mg/dL (ref 0–99)
Triglycerides: 189 mg/dL — ABNORMAL HIGH (ref 0–149)
VLDL Cholesterol Cal: 33 mg/dL (ref 5–40)

## 2020-09-26 LAB — ALT: ALT: 17 IU/L (ref 0–32)

## 2020-09-26 NOTE — Telephone Encounter (Signed)
   [facility] Pre-operative Risk Assessment    HEARTCARE STAFF: - Please ensure there is not already an duplicate clearance open for this procedure. - Under Visit Info/Reason for Call, type in Other and utilize the format Clearance MM/DD/YY or Clearance TBD. Do not use dashes or single digits. - If request is for dental extraction, please clarify the # of teeth to be extracted.  Request for surgical clearance:  1. What type of surgery is being performed? Right femoral below the popliteal bypass  2. When is this surgery scheduled? 10/03/20  3. What type of clearance is required (medical clearance vs. Pharmacy clearance to hold med vs. Both)? medical  4. Are there any medications that need to be held prior to surgery and how long? Not really sure  Practice name and name of physician performing surgery? [facility] 5. What is the office phone number? [phone]   7.   What is the office fax number? [phone]  8.   Anesthesia type (None, local, MAC, general) ? general   [name] [name] 09/26/2020, 3:31 PM  _________________________________________________________________   (provider comments below)

## 2020-09-26 NOTE — Telephone Encounter (Signed)
Patient returning call.

## 2020-09-26 NOTE — Telephone Encounter (Signed)
   Primary Cardiologist: Fransico Him, MD  Chart reviewed as part of pre-operative protocol coverage. Given past medical history and time since last visit, based on ACC/AHA guidelines, [name] [name] would be at acceptable risk for the planned procedure without further cardiovascular testing.   Patient was advised that if she develops new symptoms prior to surgery to contact our office to arrange a follow-up appointment.  She verbalized understanding.  I will route this recommendation to the requesting party via Epic fax function and remove from pre-op pool.  Please call with questions.  Jossie Ng. Moxon Messler NP-C    09/26/2020, 4:48 PM [place] Office [phone] Fax [phone]

## 2020-09-26 NOTE — Telephone Encounter (Addendum)
Contacted Dr. Grier Mitts office per Dr. Stephens Shire recommendations to request surgical clearance from her oncologist. Left message regarding request on confidential voicemail with office contact information including phone and fax number. Will also fax request to provider office.

## 2020-09-29 ENCOUNTER — Telehealth: Payer: Self-pay

## 2020-09-29 DIAGNOSIS — E7800 Pure hypercholesterolemia, unspecified: Secondary | ICD-10-CM

## 2020-09-29 MED ORDER — ATORVASTATIN CALCIUM 80 MG PO TABS: 80.0000 mg | ORAL_TABLET | Freq: Every day | ORAL | 3 refills | Status: DC

## 2020-09-29 NOTE — Telephone Encounter (Signed)
The patient has been notified of the result and verbalized understanding.  All questions (if any) were answered. Antonieta Iba, RN 09/29/2020 10:44 AM

## 2020-09-29 NOTE — Telephone Encounter (Signed)
-----   Message from Sueanne Margarita, MD sent at 09/28/2020 11:11 AM EDT ----- LDL not at goal of < 70 - increase atorvastatin to 80mg  daily and repeat FLP and ALT In 6 weeks

## 2020-09-29 NOTE — Anesthesia Preprocedure Evaluation (Addendum)
Anesthesia Evaluation  Patient identified by MRN, date of birth, ID band Patient awake    Reviewed: Allergy & Precautions, NPO status , Patient's Chart, lab work & pertinent test results  History of Anesthesia Complications Negative for: history of anesthetic complications  Airway Mallampati: III  TM Distance: >3 FB Neck ROM: Full    Dental  (+) Poor Dentition, Missing   Pulmonary asthma , COPD,  COPD inhaler, Current Smoker and Patient abstained from smoking.,    Pulmonary exam normal breath sounds clear to auscultation       Cardiovascular hypertension, Pt. on medications + Peripheral Vascular Disease (s/p right SFA and popliteal stents 12/29/07)  Normal cardiovascular exam Rhythm:Regular Rate:Normal     Neuro/Psych  Headaches, PSYCHIATRIC DISORDERS Anxiety post left parietal craniotomy for resection of meningioma 1998 CVA, Residual Symptoms    GI/Hepatic Neg liver ROS, GERD  ,  Endo/Other  diabetes, Type 2, Oral Hypoglycemic Agents  Renal/GU Renal InsufficiencyRenal disease     Musculoskeletal negative musculoskeletal ROS (+)   Abdominal   Peds  Hematology  (+) Blood dyscrasia (Plavix), anemia , CLL   Anesthesia Other Findings   Reproductive/Obstetrics                           Anesthesia Physical Anesthesia Plan  ASA: III  Anesthesia Plan: General   Post-op Pain Management:    Induction: Intravenous  PONV Risk Score and Plan: 2 and Dexamethasone and Ondansetron  Airway Management Planned: Oral ETT  Additional Equipment:   Intra-op Plan:   Post-operative Plan: Extubation in OR  Informed Consent: I have reviewed the patients History and Physical, chart, labs and discussed the procedure including the risks, benefits and alternatives for the proposed anesthesia with the patient or authorized representative who has indicated his/her understanding and acceptance.       Plan  Discussed with: CRNA  Anesthesia Plan Comments: (PAT note written by Myra Gianotti, PA-C. )     Anesthesia Quick Evaluation

## 2020-09-30 ENCOUNTER — Telehealth: Payer: Self-pay

## 2020-09-30 NOTE — Telephone Encounter (Addendum)
Contacted Dr. Grier Mitts office to follow up on surgical clearance requested on 09/26/20 per Dr. Stephens Shire recommendations. Spoke with Carlyon Prows who was unaware of clearance request and provided new fax number of [phone] to send request form for provider.   Surgical procedure clearance form faxed to number provided. Rodena Medin, RN

## 2020-10-01 ENCOUNTER — Telehealth: Payer: Self-pay | Admitting: *Deleted

## 2020-10-01 NOTE — Telephone Encounter (Signed)
Faxed medical clearance signed by Dr. Irene Limbo to [facility], Attn Herma Ard B., Dr. Trula Slade. Fax confirmation received Patient scheduled on 10/8 for right femoral-below knee popliteal bypass with vein

## 2020-10-02 ENCOUNTER — Other Ambulatory Visit (HOSPITAL_COMMUNITY)
Admission: RE | Admit: 2020-10-02 | Discharge: 2020-10-02 | Disposition: A | Discharge status: Home / Self Care | Payer: Medicare Other | Source: Ambulatory Visit | Attending: Surgery | Admitting: Surgery

## 2020-10-02 DIAGNOSIS — Z01818 Encounter for other preprocedural examination: Secondary | ICD-10-CM | POA: Diagnosis not present

## 2020-10-02 DIAGNOSIS — Z20822 Contact with and (suspected) exposure to covid-19: Secondary | ICD-10-CM | POA: Insufficient documentation

## 2020-10-02 LAB — SARS CORONAVIRUS 2 (TAT 6-24 HRS): SARS Coronavirus 2: NEGATIVE

## 2020-10-03 ENCOUNTER — Inpatient Hospital Stay (HOSPITAL_COMMUNITY): Payer: Medicare Other | Admitting: Vascular Surgery

## 2020-10-03 ENCOUNTER — Encounter (HOSPITAL_COMMUNITY): Payer: Self-pay | Admitting: Surgery

## 2020-10-03 ENCOUNTER — Encounter (HOSPITAL_COMMUNITY)
Admission: RE | Disposition: A | Discharge status: Home Health | Payer: Self-pay | Source: Ambulatory Visit | Attending: Surgery

## 2020-10-03 ENCOUNTER — Other Ambulatory Visit: Payer: Self-pay

## 2020-10-03 ENCOUNTER — Inpatient Hospital Stay (HOSPITAL_COMMUNITY)
Admission: RE | Admit: 2020-10-03 | Discharge: 2020-10-08 | DRG: 254 | Disposition: A | Discharge status: Home Health | Payer: Medicare Other | Source: Ambulatory Visit | Attending: Surgery | Admitting: Surgery

## 2020-10-03 DIAGNOSIS — Z823 Family history of stroke: Secondary | ICD-10-CM | POA: Diagnosis not present

## 2020-10-03 DIAGNOSIS — Z825 Family history of asthma and other chronic lower respiratory diseases: Secondary | ICD-10-CM

## 2020-10-03 DIAGNOSIS — J449 Chronic obstructive pulmonary disease, unspecified: Secondary | ICD-10-CM | POA: Diagnosis present

## 2020-10-03 DIAGNOSIS — G8918 Other acute postprocedural pain: Secondary | ICD-10-CM | POA: Diagnosis not present

## 2020-10-03 DIAGNOSIS — Z841 Family history of disorders of kidney and ureter: Secondary | ICD-10-CM | POA: Diagnosis not present

## 2020-10-03 DIAGNOSIS — Z83438 Family history of other disorder of lipoprotein metabolism and other lipidemia: Secondary | ICD-10-CM | POA: Diagnosis not present

## 2020-10-03 DIAGNOSIS — E1122 Type 2 diabetes mellitus with diabetic chronic kidney disease: Secondary | ICD-10-CM | POA: Diagnosis not present

## 2020-10-03 DIAGNOSIS — E1151 Type 2 diabetes mellitus with diabetic peripheral angiopathy without gangrene: Principal | ICD-10-CM | POA: Diagnosis present

## 2020-10-03 DIAGNOSIS — N183 Chronic kidney disease, stage 3 unspecified: Secondary | ICD-10-CM | POA: Diagnosis not present

## 2020-10-03 DIAGNOSIS — F17210 Nicotine dependence, cigarettes, uncomplicated: Secondary | ICD-10-CM | POA: Diagnosis not present

## 2020-10-03 DIAGNOSIS — I5030 Unspecified diastolic (congestive) heart failure: Secondary | ICD-10-CM | POA: Diagnosis not present

## 2020-10-03 DIAGNOSIS — Z79899 Other long term (current) drug therapy: Secondary | ICD-10-CM | POA: Diagnosis not present

## 2020-10-03 DIAGNOSIS — I70213 Atherosclerosis of native arteries of extremities with intermittent claudication, bilateral legs: Secondary | ICD-10-CM | POA: Diagnosis present

## 2020-10-03 DIAGNOSIS — E119 Type 2 diabetes mellitus without complications: Secondary | ICD-10-CM | POA: Diagnosis not present

## 2020-10-03 DIAGNOSIS — I10 Essential (primary) hypertension: Secondary | ICD-10-CM | POA: Diagnosis not present

## 2020-10-03 DIAGNOSIS — Z8249 Family history of ischemic heart disease and other diseases of the circulatory system: Secondary | ICD-10-CM | POA: Diagnosis not present

## 2020-10-03 DIAGNOSIS — K219 Gastro-esophageal reflux disease without esophagitis: Secondary | ICD-10-CM | POA: Diagnosis present

## 2020-10-03 DIAGNOSIS — I130 Hypertensive heart and chronic kidney disease with heart failure and stage 1 through stage 4 chronic kidney disease, or unspecified chronic kidney disease: Secondary | ICD-10-CM | POA: Diagnosis not present

## 2020-10-03 DIAGNOSIS — R531 Weakness: Secondary | ICD-10-CM | POA: Diagnosis not present

## 2020-10-03 DIAGNOSIS — E7800 Pure hypercholesterolemia, unspecified: Secondary | ICD-10-CM | POA: Diagnosis not present

## 2020-10-03 DIAGNOSIS — Z7984 Long term (current) use of oral hypoglycemic drugs: Secondary | ICD-10-CM

## 2020-10-03 DIAGNOSIS — Z833 Family history of diabetes mellitus: Secondary | ICD-10-CM

## 2020-10-03 DIAGNOSIS — Z8673 Personal history of transient ischemic attack (TIA), and cerebral infarction without residual deficits: Secondary | ICD-10-CM

## 2020-10-03 DIAGNOSIS — E785 Hyperlipidemia, unspecified: Secondary | ICD-10-CM | POA: Diagnosis present

## 2020-10-03 DIAGNOSIS — I739 Peripheral vascular disease, unspecified: Secondary | ICD-10-CM | POA: Diagnosis present

## 2020-10-03 DIAGNOSIS — I70221 Atherosclerosis of native arteries of extremities with rest pain, right leg: Secondary | ICD-10-CM | POA: Diagnosis not present

## 2020-10-03 HISTORY — PX: PATCH ANGIOPLASTY: SHX6230

## 2020-10-03 HISTORY — PX: ENDARTERECTOMY FEMORAL: SHX5804

## 2020-10-03 HISTORY — PX: FEMORAL-POPLITEAL BYPASS GRAFT: SHX937

## 2020-10-03 LAB — CBC
HCT: 30.6 % — ABNORMAL LOW (ref 36.0–46.0)
Hemoglobin: 8.9 g/dL — ABNORMAL LOW (ref 12.0–15.0)
MCH: 26.6 pg (ref 26.0–34.0)
MCHC: 29.1 g/dL — ABNORMAL LOW (ref 30.0–36.0)
MCV: 91.6 fL (ref 80.0–100.0)
Platelets: 199 10*3/uL (ref 150–400)
RBC: 3.34 MIL/uL — ABNORMAL LOW (ref 3.87–5.11)
RDW: 20.2 % — ABNORMAL HIGH (ref 11.5–15.5)
WBC: 116.3 10*3/uL (ref 4.0–10.5)
nRBC: 0.1 % (ref 0.0–0.2)

## 2020-10-03 LAB — GLUCOSE, CAPILLARY
Glucose-Capillary: 137 mg/dL — ABNORMAL HIGH (ref 70–99)
Glucose-Capillary: 177 mg/dL — ABNORMAL HIGH (ref 70–99)
Glucose-Capillary: 179 mg/dL — ABNORMAL HIGH (ref 70–99)
Glucose-Capillary: 122 mg/dL — ABNORMAL HIGH (ref 70–99)

## 2020-10-03 LAB — POCT I-STAT, CHEM 8
BUN: 16 mg/dL (ref 8–23)
Calcium, Ion: 1.07 mmol/L — ABNORMAL LOW (ref 1.15–1.40)
Chloride: 105 mmol/L (ref 98–111)
Creatinine, Ser: 1.7 mg/dL — ABNORMAL HIGH (ref 0.44–1.00)
Glucose, Bld: 135 mg/dL — ABNORMAL HIGH (ref 70–99)
HCT: 39 % (ref 36.0–46.0)
Hemoglobin: 13.3 g/dL (ref 12.0–15.0)
Potassium: 4.6 mmol/L (ref 3.5–5.1)
Sodium: 140 mmol/L (ref 135–145)
TCO2: 26 mmol/L (ref 22–32)

## 2020-10-03 LAB — CREATININE, SERUM
Creatinine, Ser: 1.72 mg/dL — ABNORMAL HIGH (ref 0.44–1.00)
GFR, Estimated: 29 mL/min — ABNORMAL LOW (ref >60)

## 2020-10-03 SURGERY — BYPASS GRAFT FEMORAL-POPLITEAL ARTERY
Anesthesia: General | Laterality: Right

## 2020-10-03 MED ORDER — PROTAMINE SULFATE 10 MG/ML IV SOLN
INTRAVENOUS | Status: AC
  Filled 2020-10-03: qty 5

## 2020-10-03 MED ORDER — ACETAMINOPHEN 650 MG RE SUPP: 325.0000 mg | RECTAL | Status: DC | PRN

## 2020-10-03 MED ORDER — SUFENTANIL CITRATE 50 MCG/ML IV SOLN
INTRAVENOUS | Status: DC | PRN
Start: 2020-10-03 — End: 2020-10-03
  Administered 2020-10-03 (×2): 5 ug via INTRAVENOUS
  Administered 2020-10-03 (×3): 10 ug via INTRAVENOUS

## 2020-10-03 MED ORDER — LACTATED RINGERS IV SOLN: INTRAVENOUS | Status: DC | PRN

## 2020-10-03 MED ORDER — DOCUSATE SODIUM 100 MG PO CAPS
100.0000 mg | ORAL_CAPSULE | Freq: Every day | ORAL | Status: DC
  Administered 2020-10-04 – 2020-10-08 (×5): 100 mg via ORAL
  Filled 2020-10-03 (×5): qty 1

## 2020-10-03 MED ORDER — INSULIN ASPART 100 UNIT/ML ~~LOC~~ SOLN
0.0000 [IU] | Freq: Every day | SUBCUTANEOUS | Status: DC
  Filled 2020-10-03: qty 0.05

## 2020-10-03 MED ORDER — LABETALOL HCL 5 MG/ML IV SOLN
10.0000 mg | INTRAVENOUS | Status: DC | PRN
  Administered 2020-10-08: 10 mg via INTRAVENOUS
  Filled 2020-10-03: qty 4

## 2020-10-03 MED ORDER — PANTOPRAZOLE SODIUM 40 MG PO TBEC
40.0000 mg | DELAYED_RELEASE_TABLET | Freq: Every day | ORAL | Status: DC
  Administered 2020-10-04 – 2020-10-08 (×5): 40 mg via ORAL
  Filled 2020-10-03 (×5): qty 1

## 2020-10-03 MED ORDER — FENTANYL CITRATE (PF) 100 MCG/2ML IJ SOLN
25.0000 ug | INTRAMUSCULAR | Status: DC | PRN
  Administered 2020-10-03: 25 ug via INTRAVENOUS

## 2020-10-03 MED ORDER — CHLORHEXIDINE GLUCONATE CLOTH 2 % EX PADS: 6.0000 | MEDICATED_PAD | Freq: Once | CUTANEOUS | Status: DC

## 2020-10-03 MED ORDER — ALUM & MAG HYDROXIDE-SIMETH 200-200-20 MG/5ML PO SUSP: 15.0000 mL | ORAL | Status: DC | PRN

## 2020-10-03 MED ORDER — SUFENTANIL CITRATE 50 MCG/ML IV SOLN
INTRAVENOUS | Status: AC
Start: 2020-10-03 — End: ?
  Filled 2020-10-03: qty 1

## 2020-10-03 MED ORDER — ACETAMINOPHEN 500 MG PO TABS
1000.0000 mg | ORAL_TABLET | Freq: Once | ORAL | Status: AC
  Administered 2020-10-03: 1000 mg via ORAL
  Filled 2020-10-03: qty 2

## 2020-10-03 MED ORDER — METOPROLOL TARTRATE 5 MG/5ML IV SOLN: 2.0000 mg | INTRAVENOUS | Status: DC | PRN

## 2020-10-03 MED ORDER — ALBUTEROL SULFATE HFA 108 (90 BASE) MCG/ACT IN AERS: 2.0000 | INHALATION_SPRAY | Freq: Four times a day (QID) | RESPIRATORY_TRACT | Status: DC | PRN

## 2020-10-03 MED ORDER — CEFAZOLIN SODIUM-DEXTROSE 2-4 GM/100ML-% IV SOLN
2.0000 g | Freq: Three times a day (TID) | INTRAVENOUS | Status: AC
  Administered 2020-10-03 – 2020-10-04 (×2): 2 g via INTRAVENOUS
  Filled 2020-10-03 (×2): qty 100

## 2020-10-03 MED ORDER — ONDANSETRON HCL 4 MG/2ML IJ SOLN
4.0000 mg | Freq: Four times a day (QID) | INTRAMUSCULAR | Status: DC | PRN
  Administered 2020-10-04: 4 mg via INTRAVENOUS
  Filled 2020-10-03 (×2): qty 2

## 2020-10-03 MED ORDER — MONTELUKAST SODIUM 10 MG PO TABS
10.0000 mg | ORAL_TABLET | Freq: Every day | ORAL | Status: DC
  Administered 2020-10-03 – 2020-10-07 (×5): 10 mg via ORAL
  Filled 2020-10-03 (×5): qty 1

## 2020-10-03 MED ORDER — GLYCOPYRROLATE 0.2 MG/ML IJ SOLN
INTRAMUSCULAR | Status: DC | PRN
  Administered 2020-10-03: .2 mg via INTRAVENOUS

## 2020-10-03 MED ORDER — ORAL CARE MOUTH RINSE: 15.0000 mL | Freq: Once | OROMUCOSAL | Status: AC

## 2020-10-03 MED ORDER — CHLORHEXIDINE GLUCONATE CLOTH 2 % EX PADS
6.0000 | MEDICATED_PAD | Freq: Every day | CUTANEOUS | Status: DC
  Administered 2020-10-03 – 2020-10-07 (×5): 6 via TOPICAL

## 2020-10-03 MED ORDER — HYDRALAZINE HCL 50 MG PO TABS
50.0000 mg | ORAL_TABLET | Freq: Three times a day (TID) | ORAL | Status: DC
  Administered 2020-10-03 – 2020-10-08 (×15): 50 mg via ORAL
  Filled 2020-10-03 (×15): qty 1

## 2020-10-03 MED ORDER — OXYCODONE-ACETAMINOPHEN 5-325 MG PO TABS
1.0000 | ORAL_TABLET | ORAL | Status: DC | PRN
  Administered 2020-10-04: 2 via ORAL
  Administered 2020-10-04: 1 via ORAL
  Administered 2020-10-05 – 2020-10-06 (×4): 2 via ORAL
  Filled 2020-10-03 (×4): qty 2
  Filled 2020-10-03: qty 1
  Filled 2020-10-03: qty 2

## 2020-10-03 MED ORDER — FENTANYL CITRATE (PF) 100 MCG/2ML IJ SOLN
INTRAMUSCULAR | Status: AC
  Administered 2020-10-03: 25 ug via INTRAVENOUS
  Filled 2020-10-03: qty 2

## 2020-10-03 MED ORDER — PROPOFOL 10 MG/ML IV BOLUS
INTRAVENOUS | Status: AC
  Filled 2020-10-03: qty 20

## 2020-10-03 MED ORDER — GUAIFENESIN-DM 100-10 MG/5ML PO SYRP: 15.0000 mL | ORAL_SOLUTION | ORAL | Status: DC | PRN

## 2020-10-03 MED ORDER — DULOXETINE HCL 60 MG PO CPEP
60.0000 mg | ORAL_CAPSULE | Freq: Every day | ORAL | Status: DC
  Administered 2020-10-04 – 2020-10-08 (×5): 60 mg via ORAL
  Filled 2020-10-03 (×5): qty 1

## 2020-10-03 MED ORDER — METFORMIN HCL 500 MG PO TABS
500.0000 mg | ORAL_TABLET | Freq: Two times a day (BID) | ORAL | Status: DC
  Administered 2020-10-04 – 2020-10-08 (×9): 500 mg via ORAL
  Filled 2020-10-03 (×9): qty 1

## 2020-10-03 MED ORDER — HEPARIN SODIUM (PORCINE) 1000 UNIT/ML IJ SOLN
INTRAMUSCULAR | Status: DC | PRN
  Administered 2020-10-03: 2000 [IU] via INTRAVENOUS
  Administered 2020-10-03: 7000 [IU] via INTRAVENOUS
  Administered 2020-10-03: 2000 [IU] via INTRAVENOUS

## 2020-10-03 MED ORDER — SUGAMMADEX SODIUM 200 MG/2ML IV SOLN
INTRAVENOUS | Status: DC | PRN
  Administered 2020-10-03: 200 mg via INTRAVENOUS

## 2020-10-03 MED ORDER — PHENYLEPHRINE HCL-NACL 10-0.9 MG/250ML-% IV SOLN
INTRAVENOUS | Status: DC | PRN
  Administered 2020-10-03: 40 ug/min via INTRAVENOUS

## 2020-10-03 MED ORDER — PHENOL 1.4 % MT LIQD: 1.0000 | OROMUCOSAL | Status: DC | PRN

## 2020-10-03 MED ORDER — ALBUMIN HUMAN 5 % IV SOLN: INTRAVENOUS | Status: DC | PRN

## 2020-10-03 MED ORDER — HEPARIN SODIUM (PORCINE) 1000 UNIT/ML IJ SOLN
INTRAMUSCULAR | Status: AC
  Filled 2020-10-03: qty 1

## 2020-10-03 MED ORDER — SENNOSIDES-DOCUSATE SODIUM 8.6-50 MG PO TABS: 1.0000 | ORAL_TABLET | Freq: Every evening | ORAL | Status: DC | PRN

## 2020-10-03 MED ORDER — CHLORHEXIDINE GLUCONATE 0.12 % MT SOLN
15.0000 mL | Freq: Once | OROMUCOSAL | Status: AC
  Administered 2020-10-03: 15 mL via OROMUCOSAL
  Filled 2020-10-03: qty 15

## 2020-10-03 MED ORDER — GABAPENTIN 300 MG PO CAPS
300.0000 mg | ORAL_CAPSULE | Freq: Three times a day (TID) | ORAL | Status: DC
  Administered 2020-10-03 – 2020-10-08 (×15): 300 mg via ORAL
  Filled 2020-10-03 (×15): qty 1

## 2020-10-03 MED ORDER — FLUTICASONE FUROATE-VILANTEROL 100-25 MCG/INH IN AEPB
1.0000 | INHALATION_SPRAY | Freq: Every day | RESPIRATORY_TRACT | Status: DC
  Administered 2020-10-04 – 2020-10-08 (×5): 1 via RESPIRATORY_TRACT
  Filled 2020-10-03: qty 28

## 2020-10-03 MED ORDER — EMPAGLIFLOZIN 10 MG PO TABS
10.0000 mg | ORAL_TABLET | Freq: Every day | ORAL | Status: DC
  Administered 2020-10-04 – 2020-10-08 (×5): 10 mg via ORAL
  Filled 2020-10-03 (×5): qty 1

## 2020-10-03 MED ORDER — INSULIN ASPART 100 UNIT/ML ~~LOC~~ SOLN
0.0000 [IU] | Freq: Three times a day (TID) | SUBCUTANEOUS | Status: DC
  Administered 2020-10-03 – 2020-10-06 (×3): 3 [IU] via SUBCUTANEOUS
  Administered 2020-10-07 – 2020-10-08 (×2): 2 [IU] via SUBCUTANEOUS
  Filled 2020-10-03: qty 0.15

## 2020-10-03 MED ORDER — POTASSIUM CHLORIDE CRYS ER 20 MEQ PO TBCR: 20.0000 meq | EXTENDED_RELEASE_TABLET | Freq: Every day | ORAL | Status: DC | PRN

## 2020-10-03 MED ORDER — ONDANSETRON HCL 4 MG/2ML IJ SOLN: 4.0000 mg | Freq: Once | INTRAMUSCULAR | Status: DC | PRN

## 2020-10-03 MED ORDER — ATORVASTATIN CALCIUM 80 MG PO TABS
80.0000 mg | ORAL_TABLET | Freq: Every day | ORAL | Status: DC
  Administered 2020-10-04 – 2020-10-08 (×5): 80 mg via ORAL
  Filled 2020-10-03 (×5): qty 1

## 2020-10-03 MED ORDER — TRAMADOL HCL 50 MG PO TABS
50.0000 mg | ORAL_TABLET | Freq: Two times a day (BID) | ORAL | Status: DC | PRN
  Administered 2020-10-04 – 2020-10-05 (×3): 50 mg via ORAL
  Filled 2020-10-03 (×3): qty 1

## 2020-10-03 MED ORDER — SODIUM CHLORIDE 0.9 % IV SOLN: 500.0000 mL | Freq: Once | INTRAVENOUS | Status: DC | PRN

## 2020-10-03 MED ORDER — CLOPIDOGREL BISULFATE 75 MG PO TABS
75.0000 mg | ORAL_TABLET | Freq: Every day | ORAL | Status: DC
  Administered 2020-10-03 – 2020-10-08 (×6): 75 mg via ORAL
  Filled 2020-10-03 (×6): qty 1

## 2020-10-03 MED ORDER — LIDOCAINE HCL (CARDIAC) PF 100 MG/5ML IV SOSY
PREFILLED_SYRINGE | INTRAVENOUS | Status: DC | PRN
  Administered 2020-10-03: 100 mg via INTRAVENOUS

## 2020-10-03 MED ORDER — ACETAMINOPHEN 325 MG PO TABS: 325.0000 mg | ORAL_TABLET | ORAL | Status: DC | PRN

## 2020-10-03 MED ORDER — SODIUM CHLORIDE 0.9 % IV SOLN
INTRAVENOUS | Status: DC | PRN
  Administered 2020-10-03: 500 mL

## 2020-10-03 MED ORDER — SODIUM CHLORIDE 0.9 % IV SOLN: INTRAVENOUS | Status: DC

## 2020-10-03 MED ORDER — FUROSEMIDE 20 MG PO TABS
20.0000 mg | ORAL_TABLET | Freq: Every day | ORAL | Status: DC
  Administered 2020-10-04 – 2020-10-08 (×5): 20 mg via ORAL
  Filled 2020-10-03 (×5): qty 1

## 2020-10-03 MED ORDER — HYDRALAZINE HCL 20 MG/ML IJ SOLN: 5.0000 mg | INTRAMUSCULAR | Status: DC | PRN

## 2020-10-03 MED ORDER — DEXAMETHASONE SODIUM PHOSPHATE 10 MG/ML IJ SOLN
INTRAMUSCULAR | Status: DC | PRN
  Administered 2020-10-03: 10 mg via INTRAVENOUS

## 2020-10-03 MED ORDER — PROTAMINE SULFATE 10 MG/ML IV SOLN
INTRAVENOUS | Status: DC | PRN
  Administered 2020-10-03: 50 mg via INTRAVENOUS

## 2020-10-03 MED ORDER — LACTATED RINGERS IV SOLN: INTRAVENOUS | Status: DC

## 2020-10-03 MED ORDER — ONDANSETRON HCL 4 MG/2ML IJ SOLN
INTRAMUSCULAR | Status: DC | PRN
  Administered 2020-10-03: 4 mg via INTRAVENOUS

## 2020-10-03 MED ORDER — HEMOSTATIC AGENTS (NO CHARGE) OPTIME
TOPICAL | Status: DC | PRN
  Administered 2020-10-03 (×2): 1 via TOPICAL

## 2020-10-03 MED ORDER — NITROGLYCERIN 0.4 MG SL SUBL: 0.4000 mg | SUBLINGUAL_TABLET | SUBLINGUAL | Status: DC | PRN

## 2020-10-03 MED ORDER — CYCLOBENZAPRINE HCL 10 MG PO TABS
10.0000 mg | ORAL_TABLET | Freq: Three times a day (TID) | ORAL | Status: DC | PRN
  Administered 2020-10-04: 10 mg via ORAL
  Filled 2020-10-03: qty 1

## 2020-10-03 MED ORDER — ROCURONIUM 10MG/ML (10ML) SYRINGE FOR MEDFUSION PUMP - OPTIME
INTRAVENOUS | Status: DC | PRN
  Administered 2020-10-03: 50 mg via INTRAVENOUS

## 2020-10-03 MED ORDER — CEFAZOLIN SODIUM-DEXTROSE 2-4 GM/100ML-% IV SOLN
2.0000 g | INTRAVENOUS | Status: AC
  Administered 2020-10-03 (×2): 2 g via INTRAVENOUS
  Filled 2020-10-03: qty 100

## 2020-10-03 MED ORDER — PROPOFOL 10 MG/ML IV BOLUS
INTRAVENOUS | Status: DC | PRN
  Administered 2020-10-03: 140 mg via INTRAVENOUS

## 2020-10-03 MED ORDER — ALBUTEROL SULFATE (2.5 MG/3ML) 0.083% IN NEBU: 3.0000 mL | INHALATION_SOLUTION | Freq: Four times a day (QID) | RESPIRATORY_TRACT | Status: DC | PRN

## 2020-10-03 MED ORDER — ASPIRIN EC 81 MG PO TBEC
81.0000 mg | DELAYED_RELEASE_TABLET | Freq: Every day | ORAL | Status: DC
  Administered 2020-10-04 – 2020-10-08 (×5): 81 mg via ORAL
  Filled 2020-10-03 (×5): qty 1

## 2020-10-03 MED ORDER — SODIUM CHLORIDE 0.9 % IV SOLN
INTRAVENOUS | Status: AC
  Filled 2020-10-03: qty 1.2

## 2020-10-03 MED ORDER — HYDROMORPHONE HCL 1 MG/ML IJ SOLN
0.5000 mg | INTRAMUSCULAR | Status: DC | PRN
  Administered 2020-10-03 – 2020-10-06 (×8): 1 mg via INTRAVENOUS
  Filled 2020-10-03 (×8): qty 1

## 2020-10-03 MED ORDER — MAGNESIUM SULFATE 2 GM/50ML IV SOLN: 2.0000 g | Freq: Every day | INTRAVENOUS | Status: DC | PRN

## 2020-10-03 MED ORDER — HEPARIN SODIUM (PORCINE) 5000 UNIT/ML IJ SOLN
5000.0000 [IU] | Freq: Three times a day (TID) | INTRAMUSCULAR | Status: DC
  Administered 2020-10-04 – 2020-10-08 (×13): 5000 [IU] via SUBCUTANEOUS
  Filled 2020-10-03 (×14): qty 1

## 2020-10-03 MED ORDER — CEFAZOLIN SODIUM 1 G IJ SOLR
INTRAMUSCULAR | Status: AC
  Filled 2020-10-03: qty 20

## 2020-10-03 MED ORDER — 0.9 % SODIUM CHLORIDE (POUR BTL) OPTIME
TOPICAL | Status: DC | PRN
  Administered 2020-10-03: 2000 mL

## 2020-10-03 SURGICAL SUPPLY — 68 items
ADH SKN CLS APL DERMABOND .7 (GAUZE/BANDAGES/DRESSINGS) ×2
AGENT HMST KT MTR STRL THRMB (HEMOSTASIS) ×4
BANDAGE ESMARK 6X9 LF (GAUZE/BANDAGES/DRESSINGS) IMPLANT
BNDG CMPR 9X6 STRL LF SNTH (GAUZE/BANDAGES/DRESSINGS) ×2
BNDG ESMARK 6X9 LF (GAUZE/BANDAGES/DRESSINGS) ×4
CANISTER SUCT 3000ML PPV (MISCELLANEOUS) ×4 IMPLANT
CANNULA VESSEL 3MM 2 BLNT TIP (CANNULA) ×4 IMPLANT
CATH EMB 4FR 40CM (CATHETERS) ×2 IMPLANT
CLIP VESOCCLUDE MED 24/CT (CLIP) ×6 IMPLANT
CLIP VESOCCLUDE SM WIDE 24/CT (CLIP) ×4 IMPLANT
COVER WAND RF STERILE (DRAPES) ×4 IMPLANT
CUFF TOURN SGL QUICK 24 (TOURNIQUET CUFF)
CUFF TOURN SGL QUICK 34 (TOURNIQUET CUFF) ×4
CUFF TOURN SGL QUICK 42 (TOURNIQUET CUFF) IMPLANT
CUFF TRNQT CYL 24X4X16.5-23 (TOURNIQUET CUFF) IMPLANT
CUFF TRNQT CYL 34X4.125X (TOURNIQUET CUFF) IMPLANT
DERMABOND ADVANCED (GAUZE/BANDAGES/DRESSINGS) ×2
DERMABOND ADVANCED .7 DNX12 (GAUZE/BANDAGES/DRESSINGS) ×2 IMPLANT
DRAIN CHANNEL 15F RND FF W/TCR (WOUND CARE) IMPLANT
DRAPE HALF SHEET 40X57 (DRAPES) IMPLANT
DRAPE X-RAY CASS 24X20 (DRAPES) IMPLANT
ELECT REM PT RETURN 9FT ADLT (ELECTROSURGICAL) ×4
ELECTRODE REM PT RTRN 9FT ADLT (ELECTROSURGICAL) ×2 IMPLANT
EVACUATOR SILICONE 100CC (DRAIN) IMPLANT
GLOVE BIOGEL PI IND STRL 7.5 (GLOVE) ×2 IMPLANT
GLOVE BIOGEL PI INDICATOR 7.5 (GLOVE) ×10
GLOVE SURG SS PI 7.0 STRL IVOR (GLOVE) ×6 IMPLANT
GLOVE SURG SS PI 7.5 STRL IVOR (GLOVE) ×4 IMPLANT
GOWN STRL REUS W/ TWL LRG LVL3 (GOWN DISPOSABLE) ×4 IMPLANT
GOWN STRL REUS W/ TWL XL LVL3 (GOWN DISPOSABLE) ×2 IMPLANT
GOWN STRL REUS W/TWL LRG LVL3 (GOWN DISPOSABLE) ×8
GOWN STRL REUS W/TWL XL LVL3 (GOWN DISPOSABLE) ×4
GRAFT PROPATEN W/RING 6X80X60 (Vascular Products) ×2 IMPLANT
HEMOSTAT SNOW SURGICEL 2X4 (HEMOSTASIS) IMPLANT
INSERT FOGARTY SM (MISCELLANEOUS) IMPLANT
KIT BASIN OR (CUSTOM PROCEDURE TRAY) ×4 IMPLANT
KIT DRSG PREVENA PLUS 7DAY 125 (MISCELLANEOUS) ×2 IMPLANT
KIT PREVENA INCISION MGT 13 (CANNISTER) ×2 IMPLANT
KIT TURNOVER KIT B (KITS) ×4 IMPLANT
MARKER GRAFT CORONARY BYPASS (MISCELLANEOUS) IMPLANT
NS IRRIG 1000ML POUR BTL (IV SOLUTION) ×8 IMPLANT
PACK PERIPHERAL VASCULAR (CUSTOM PROCEDURE TRAY) ×4 IMPLANT
PAD ARMBOARD 7.5X6 YLW CONV (MISCELLANEOUS) ×8 IMPLANT
SET COLLECT BLD 21X3/4 12 (NEEDLE) IMPLANT
STOPCOCK 4 WAY LG BORE MALE ST (IV SETS) ×2 IMPLANT
SURGIFLO W/THROMBIN 8M KIT (HEMOSTASIS) ×4 IMPLANT
SUT ETHILON 3 0 PS 1 (SUTURE) IMPLANT
SUT GORETEX 6.0 TT13 (SUTURE) IMPLANT
SUT GORETEX 6.0 TT9 (SUTURE) IMPLANT
SUT PROLENE 5 0 C 1 24 (SUTURE) ×10 IMPLANT
SUT PROLENE 6 0 BV (SUTURE) ×20 IMPLANT
SUT PROLENE 7 0 BV 1 (SUTURE) IMPLANT
SUT PROLENE 7 0 BV1 MDA (SUTURE) ×2 IMPLANT
SUT SILK 2 0 SH (SUTURE) ×4 IMPLANT
SUT SILK 3 0 (SUTURE) ×12
SUT SILK 3-0 18XBRD TIE 12 (SUTURE) IMPLANT
SUT VIC AB 2-0 CT1 27 (SUTURE) ×8
SUT VIC AB 2-0 CT1 TAPERPNT 27 (SUTURE) ×4 IMPLANT
SUT VIC AB 3-0 SH 27 (SUTURE) ×20
SUT VIC AB 3-0 SH 27X BRD (SUTURE) ×4 IMPLANT
SUT VIC AB 4-0 PS2 18 (SUTURE) ×6 IMPLANT
SUT VICRYL 4-0 PS2 18IN ABS (SUTURE) ×8 IMPLANT
SYR 3ML LL SCALE MARK (SYRINGE) ×2 IMPLANT
TOWEL GREEN STERILE (TOWEL DISPOSABLE) ×4 IMPLANT
TRAY FOLEY MTR SLVR 16FR STAT (SET/KITS/TRAYS/PACK) ×4 IMPLANT
TUBING EXTENTION W/L.L. (IV SETS) IMPLANT
UNDERPAD 30X36 HEAVY ABSORB (UNDERPADS AND DIAPERS) ×4 IMPLANT
WATER STERILE IRR 1000ML POUR (IV SOLUTION) ×4 IMPLANT

## 2020-10-03 NOTE — Anesthesia Postprocedure Evaluation (Signed)
Anesthesia Post Note  Patient: [name] [name]  Procedure(s) Performed: RIGHT FEMORAL- BELOW KNEE POPLITEAL BYPASS USING GORE PROPATEN 6MM GRAFT (Right ) RIGHT FEMORAL ENDARTERECTOMY WITH VEIN PATCH ANGIOPLASTY (Right ) VEIN PATCH ANGIOPLASTY     Patient location during evaluation: PACU Anesthesia Type: General Level of consciousness: awake Pain management: pain level controlled Vital Signs Assessment: post-procedure vital signs reviewed and stable Respiratory status: spontaneous breathing, nonlabored ventilation, respiratory function stable and patient connected to nasal cannula oxygen Cardiovascular status: blood pressure returned to baseline and stable Postop Assessment: no apparent nausea or vomiting Anesthetic complications: no   No complications documented.  Last Vitals:  Vitals:   10/03/20 1455 10/03/20 1510  BP: (!) 152/73 (!) 162/77  Pulse: 80 80  Resp: 14 13  Temp:    SpO2: 97% 96%    Last Pain:  Vitals:   10/03/20 1510  TempSrc:   PainSc: Asleep                 Catalina Gravel

## 2020-10-03 NOTE — Anesthesia Procedure Notes (Signed)
Procedure Name: Intubation Date/Time: 10/03/2020 7:37 AM Performed by: Claris Che, CRNA Pre-anesthesia Checklist: Patient identified, Emergency Drugs available, Suction available, Patient being monitored and Timeout performed Patient Re-evaluated:Patient Re-evaluated prior to induction Oxygen Delivery Method: Circle system utilized Preoxygenation: Pre-oxygenation with 100% oxygen Induction Type: IV induction and Cricoid Pressure applied Ventilation: Mask ventilation without difficulty Laryngoscope Size: Mac and 4 Grade View: Grade I Tube type: Oral Tube size: 7.5 mm Number of attempts: 1 Airway Equipment and Method: Stylet Placement Confirmation: ETT inserted through vocal cords under direct vision,  positive ETCO2 and breath sounds checked- equal and bilateral Secured at: 22 cm Tube secured with: Tape Dental Injury: Teeth and Oropharynx as per pre-operative assessment

## 2020-10-03 NOTE — Transfer of Care (Signed)
Immediate Anesthesia Transfer of Care Note  Patient: [name] [name]  Procedure(s) Performed: RIGHT FEMORAL- BELOW KNEE POPLITEAL BYPASS USING GORE PROPATEN 6MM GRAFT (Right ) RIGHT FEMORAL ENDARTERECTOMY WITH VEIN PATCH ANGIOPLASTY (Right ) VEIN PATCH ANGIOPLASTY  Patient Location: PACU  Anesthesia Type:General  Level of Consciousness: sedated, drowsy, patient cooperative and responds to stimulation  Airway & Oxygen Therapy: Patient Spontanous Breathing and Patient connected to nasal cannula oxygen  Post-op Assessment: Report given to RN, Post -op Vital signs reviewed and stable and Patient moving all extremities X 4  Post vital signs: Reviewed and stable  Last Vitals:  Vitals Value Taken Time  BP 181/73 10/03/20 1311  Temp 36.7 C 10/03/20 1310  Pulse 93 10/03/20 1312  Resp 13 10/03/20 1312  SpO2 96 % 10/03/20 1312  Vitals shown include unvalidated device data.  Last Pain:  Vitals:   10/03/20 0614  TempSrc: Oral         Complications: No complications documented.

## 2020-10-03 NOTE — Interval H&P Note (Signed)
History and Physical Interval Note:  10/03/2020 7:24 AM  [name] [name]  has presented today for surgery, with the diagnosis of ATHEROSCLEROSIS OF NATIVE ARTERY OF BOTH LOWER EXTREMITIES WITH INTERMITTENT CLAUDICATION.  The various methods of treatment have been discussed with the patient and family. After consideration of risks, benefits and other options for treatment, the patient has consented to  Procedure(s): RIGHT FEMORAL- [place] (Right) as a surgical intervention.  The patient's history has been reviewed, patient examined, no change in status, stable for surgery.  I have reviewed the patient's chart and labs.  Questions were answered to the patient's satisfaction.     Annamarie Major

## 2020-10-03 NOTE — Progress Notes (Signed)
Pt received from PACU. VSS. R DP and PT pulses dopplerable. R leg incisions, clean, dry and intact. Pt and family oriented to room and unit. Call light in reach.  Clyde Canterbury, RN

## 2020-10-04 LAB — BASIC METABOLIC PANEL
Anion gap: 7 (ref 5–15)
BUN: 15 mg/dL (ref 8–23)
CO2: 29 mmol/L (ref 22–32)
Calcium: 7.8 mg/dL — ABNORMAL LOW (ref 8.9–10.3)
Chloride: 107 mmol/L (ref 98–111)
Creatinine, Ser: 1.56 mg/dL — ABNORMAL HIGH (ref 0.44–1.00)
GFR, Estimated: 33 mL/min — ABNORMAL LOW (ref >60)
Glucose, Bld: 125 mg/dL — ABNORMAL HIGH (ref 70–99)
Potassium: 6.4 mmol/L (ref 3.5–5.1)
Sodium: 143 mmol/L (ref 135–145)

## 2020-10-04 LAB — CBC
HCT: 31.5 % — ABNORMAL LOW (ref 36.0–46.0)
Hemoglobin: 8.9 g/dL — ABNORMAL LOW (ref 12.0–15.0)
MCH: 26 pg (ref 26.0–34.0)
MCHC: 28.3 g/dL — ABNORMAL LOW (ref 30.0–36.0)
MCV: 92.1 fL (ref 80.0–100.0)
Platelets: 195 10*3/uL (ref 150–400)
RBC: 3.42 MIL/uL — ABNORMAL LOW (ref 3.87–5.11)
RDW: 20.4 % — ABNORMAL HIGH (ref 11.5–15.5)
WBC: 123.3 10*3/uL (ref 4.0–10.5)
nRBC: 0.1 % (ref 0.0–0.2)

## 2020-10-04 LAB — LIPID PANEL
Cholesterol: 103 mg/dL (ref 0–200)
HDL: 24 mg/dL — ABNORMAL LOW (ref >40)
LDL Cholesterol: 49 mg/dL (ref 0–99)
Total CHOL/HDL Ratio: 4.3 RATIO
Triglycerides: 151 mg/dL — ABNORMAL HIGH (ref <150)
VLDL: 30 mg/dL (ref 0–40)

## 2020-10-04 LAB — GLUCOSE, CAPILLARY
Glucose-Capillary: 119 mg/dL — ABNORMAL HIGH (ref 70–99)
Glucose-Capillary: 136 mg/dL — ABNORMAL HIGH (ref 70–99)
Glucose-Capillary: 200 mg/dL — ABNORMAL HIGH (ref 70–99)
Glucose-Capillary: 147 mg/dL — ABNORMAL HIGH (ref 70–99)

## 2020-10-04 LAB — POTASSIUM: Potassium: 5.7 mmol/L — ABNORMAL HIGH (ref 3.5–5.1)

## 2020-10-04 NOTE — Progress Notes (Addendum)
Progress Note    10/04/2020 9:35 AM 1 Day Post-Op  Subjective:  Sleepy, no major complaints   Vitals:   10/04/20 0818 10/04/20 0848  BP: (!) 156/70   Pulse: 78   Resp: 16   Temp: 98.2 F (36.8 C)   SpO2: 99% 96%   Physical Exam: Cardiac: regular rate and rhythm Lungs: non labored Incisions:  Right groin and lower extremity incisions clean, dry and intact without swelling or hematoma. Prevena wound VAC to right groin with good seal Extremities:  Right lower extremity well perfused and warm. Doppler PT/ DP signals Neurologic: alert and oriented  CBC    Component Value Date/Time   WBC 123.3 (HH) 10/04/2020 0114   RBC 3.42 (L) 10/04/2020 0114   HGB 8.9 (L) 10/04/2020 0114   HGB 11.4 (L) 06/28/2019 1146   HGB 11.0 (L) 03/09/2019 1024   HGB 10.8 (L) 12/13/2017 1217   HCT 31.5 (L) 10/04/2020 0114   HCT 35.5 03/09/2019 1024   HCT 34.5 (L) 12/13/2017 1217   PLT 195 10/04/2020 0114   PLT 209 06/28/2019 1146   PLT 247 03/09/2019 1024   MCV 92.1 10/04/2020 0114   MCV 86 03/09/2019 1024   MCV 88.9 12/13/2017 1217   MCH 26.0 10/04/2020 0114   MCHC 28.3 (L) 10/04/2020 0114   RDW 20.4 (H) 10/04/2020 0114   RDW 16.0 (H) 03/09/2019 1024   RDW 16.5 (H) 12/13/2017 1217   LYMPHSABS 121.7 (H) 09/24/2020 0847   LYMPHSABS 44.2 (H) 12/13/2017 1217   MONOABS 33.4 (H) 09/24/2020 0847   MONOABS 2.3 (H) 12/13/2017 1217   EOSABS 0.3 09/24/2020 0847   EOSABS 0.1 12/13/2017 1217   BASOSABS 0.3 (H) 09/24/2020 0847   BASOSABS 0.4 (H) 12/13/2017 1217    BMET    Component Value Date/Time   NA 143 10/04/2020 0114   NA 140 06/16/2020 1010   NA 142 12/13/2017 1217   K 5.7 (H) 10/04/2020 0550   K 4.5 12/13/2017 1217   CL 107 10/04/2020 0114   CO2 29 10/04/2020 0114   CO2 30 (H) 12/13/2017 1217   GLUCOSE 125 (H) 10/04/2020 0114   GLUCOSE 98 12/13/2017 1217   BUN 15 10/04/2020 0114   BUN 16 06/16/2020 1010   BUN 10.2 12/13/2017 1217   CREATININE 1.56 (H) 10/04/2020 0114    CREATININE 1.62 (H) 08/05/2020 1324   CREATININE 1.3 (H) 12/13/2017 1217   CALCIUM 7.8 (L) 10/04/2020 0114   CALCIUM 8.9 12/13/2017 1217   GFRNONAA 33 (L) 10/04/2020 0114   GFRNONAA 31 (L) 08/05/2020 1324   GFRNONAA 45 (L) 06/04/2016 1634   GFRAA 34 (L) 09/24/2020 0847   GFRAA 36 (L) 08/05/2020 1324   GFRAA 52 (L) 06/04/2016 1634    INR    Component Value Date/Time   INR 0.9 09/24/2020 0847     Intake/Output Summary (Last 24 hours) at 10/04/2020 0935 Last data filed at 10/04/2020 0450 Gross per 24 hour  Intake 2003.78 ml  Output 1450 ml  Net 553.78 ml     Assessment/Plan:  74 y.o. female is s/p  #1: Right femoral to below-knee popliteal artery bypass graft with 6 mm external ring PTFE                         #2: Right femoral endarterectomy extending into the profundofemoral artery with vein patch angioplasty                         #  3: Right femoral Prevena wound VAC  1 Day Post-Op  Doing well post op. RLE is well perfused and warm with Brisk doppler signals. Prevena VAC issues overnight resolved. VAC with good seal. Hemodynamically stable. Encouraged OOB and mobilize today  DVT prophylaxis:  Sq heparin   [name], [place] [facility] [phone] 10/04/2020 9:35 AM    I have interviewed and examined patient with PA and agree with assessment and plan above.   Jock Mahon C. Donzetta Matters, MD [facility] [place] Office: [phone] Pager: [phone]

## 2020-10-04 NOTE — Op Note (Signed)
Patient name: [name] [name] MRN: [id number] DOB: July 07, 1946 Sex: female  10/03/2020 Pre-operative Diagnosis: Right leg rest pain Post-operative diagnosis:  Same Surgeon:  Annamarie Major Assistants:  Ivin Booty Procedure:   #1: Right femoral to below-knee popliteal artery bypass graft with 6 mm external ring PTFE   #2: Right femoral endarterectomy extending into the profundofemoral artery with vein patch angioplasty   #3: Right femoral Prevena wound VAC Anesthesia: General Blood Loss: 300 cc Specimens: None  Findings: The patient had a good vein down to the distal thigh however below this, it was not usable.  I had to perform an extensive endarterectomy of the common femoral and profundofemoral artery.  During the endarterectomy, the posterior wall of the common femoral artery became very thin.  This caused a tear.  I felt the best way to repair it was to transect it and then closed the posterior wall with running 6-0 Prolene.  I then used the saphenous vein as a vein patch from the proximal common femoral artery down onto the profundofemoral artery for approximately 1 cm.  I then placed a Gore-Tex graft onto the hood of the vein patch.  The distal anastomosis was to the below-knee popliteal artery which was disease-free.  She did have significant plaque in the popliteal artery down to the joint space.  The below-knee popliteal artery was disease-free.  Indications: This is a 74 year old female with right leg rest pain.  She comes in today for revascularization.  Procedure:  The patient was identified in the holding area and taken to [place] 12  The patient was then placed supine on the table. general anesthesia was administered.  The patient was prepped and draped in the usual sterile fashion.  A time out was called and antibiotics were administered.  A PA was necessary to expedite the procedure and assist with the technical details.  A longitudinal incision was made in the right groin.   Cautery was used divide subcutaneous tissue down the femoral sheath which was opened sharply.  I dissected out the common femoral artery from the inguinal ligament down to the bifurcation.  I then exposed the profundofemoral artery down to its primary branch.  Through the same incision I identified the saphenous vein and dissected this out up to the saphenofemoral junction.  I then proceeded to harvest the saphenous vein through skip incisions down the lower leg.  In the distal thigh, the vein became very small and subfascial.  I continued with exposure of the vein through the below-knee incision.  It was clearly not usable at this level.  I then dissected out the below-knee popliteal artery which was disease free.  I reached up to the proximal below-knee popliteal artery as I was considering an above-knee bypass graft given the short vein, however the artery was very calcified at this level and so I do not think an above-knee bypass would be appropriate.  I contemplated a composite graft.  Next, a subsartorial tunnel was created between the groin incision and the below-knee incision.  The patient was then fully heparinized.  After the heparin circulated, the common femoral and profundofemoral artery were occluded.  A #11 blade was used to make an arteriotomy which was extended longitudinally.  This went down onto the profundofemoral for approximately 1 cm.  There was extensive posterior plaque which was removed from the profundofemoral artery.  I then performed endarterectomy of the proximal superficial femoral artery and the common femoral artery.  This was done  up to the inguinal ligament.  The posterior wall of the common femoral artery became very thin and actually tore.  In order to repair this, I transected the posterior wall and then reconnected it with running 6-0 Prolene.  At this point, I felt the best option was to place a vein patch.  Therefore I aborted the idea of a composite graft and used the best  portion of the vein for a vein patch.  The vein patch was sewn in with a 5-0 Prolene.  Once this was completed the clamps were released.  The repair was hemostatic.  I then reoccluded the common femoral artery and using a #11 blade to open the patch for approximately 2 cm.  A 6 mm external ring PTFE Gore-Tex graft was brought onto the field and spatulated to fit the size of the venotomy.  A running anastomosis was created with 6-0 Prolene.  Once this was completed, the clamps were released.  There was excellent pulsatile flow to the Gore-Tex graft.  The graft was then brought through the previously created tunnel.  Next a Weber was placed in the upper thigh followed by tourniquet.  The leg was exsanguinated with an Esmarch.  A #11 blade was used to make an arteriotomy in the below-knee popliteal artery which was extended longitudinally with Potts scissors.  The leg was straightened and the graft was cut to the appropriate length and spatulated to fit the size the arteriotomy.  The below-knee popliteal artery was disease-free measuring about 4 mm.  A end-to-side anastomosis was created with 6-0 Prolene.  Prior to completion the tourniquet was let down and the appropriate flushing maneuvers were performed.  The anastomosis was then completed.  The patient had a brisk posterior tibial Doppler signal which was graft dependent.  The heparin was reversed with 50 mg of protamine.  Once the wounds were hemostatic, I closed the vein harvest sites with 2 layers of 3-0 Vicryl.  The below-knee incision was closed by reapproximating the fascia with 2-0 Vicryl the subcutaneous tissue with 3-0 Vicryl, and the skin with 4-0 subcuticular suture.  The groin was irrigated.  Once it was hemostatic, the femoral sheath was reapproximated with 2-0 Vicryl.  The subcutaneous tissue was then closed with additional layers with 2-0 and 3-0 Vicryl, followed by subcuticular closure.  A Prevena wound VAC was placed in the groin.  Dermabond was  placed on the incisions.  Patient tolerated the procedure well.  There were no immediate complications.   Disposition: To PACU stable.   Theotis Burrow, M.D., [facility] [place] Office: [phone] Pager:  [phone]

## 2020-10-04 NOTE — Progress Notes (Signed)
Patient voided 200 ml of urine via [place], post Foley catheter removal. No major issues or complaints with voiding.

## 2020-10-04 NOTE — Progress Notes (Signed)
Foley catheter removed. Patient is due to void.

## 2020-10-04 NOTE — Progress Notes (Signed)
Notified by lab that pt potassium was 6.4. Potassium previous day was 4.6. RN put in stat lab order for phlebotomy to redraw potassium lab to confirm.

## 2020-10-04 NOTE — Progress Notes (Signed)
Patient has not voided since Foley catheter removal at 9 am.  Bladder scan volume 147 ml.  Paged Dr. Donzetta Matters, MD who advised  to continue to monitor and allow a few more hours to attempt to void.

## 2020-10-04 NOTE — Evaluation (Signed)
Occupational Therapy Evaluation Patient Details Name: [name] [name] MRN: [id number] DOB: 1946-07-15 Today's Date: 10/04/2020    History of Present Illness The pt is a 74 yo female presenting s/p R femoral - popliteal artery bypass graft 10/9 due to progressive pain in her RLE. PMH includes: sucessful recanalization and stenting of L supeficial femoral arter in 2018, HLD, HTN, COPD, tobacco use, and DM II.     Clinical Impression   Patient was admitted with the above diagnosis.  Presenting with R leg pain, decreased safety, impaired balance, and a tendency to over exaggerate pain and inability to move or participate.  All of this is impacting her ADL and mobility independence in the acute care setting.  SNF is recommended unless the patient can significantly improve her ability to move and care for herself.  Her spouse would not be able to assist her.  OT will continue to follow her while she is in the hospital.      Follow Up Recommendations  SNF    Equipment Recommendations  3 in 1 bedside commode    Recommendations for Other Services       Precautions / Restrictions Precautions Precautions: Fall Precaution Comments: wound vac R upper thigh Restrictions Weight Bearing Restrictions: No      Mobility Bed Mobility   Bed Mobility: Sit to Supine       Sit to supine: Mod assist   General bed mobility comments: patient initially declined to sit on the bed, stated she could not sit down.  Eventually she sat and was able to lie down with assist for the L leg.  Transfers Overall transfer level: Needs assistance   Transfers: Sit to/from Stand;Stand Pivot Transfers Sit to Stand: Mod assist Stand pivot transfers: Min assist            Balance Overall balance assessment: Needs assistance Sitting-balance support: Bilateral upper extremity supported;Feet supported Sitting balance-Leahy Scale: Poor Sitting balance - Comments: leans to L and back.   Standing balance support:  Bilateral upper extremity supported Standing balance-Leahy Scale: Poor                             ADL either performed or assessed with clinical judgement   ADL Overall ADL's : Needs assistance/impaired Eating/Feeding: Set up;Sitting   Grooming: Set up;Sitting           Upper Body Dressing : Moderate assistance;Sitting   Lower Body Dressing: Total assistance;Bed level               Functional mobility during ADLs: Moderate assistance General ADL Comments: Patient yelling and carring on regarding pain to her leg, and inability to assist or help.  OT stood patient, and she bore weight to her leg and took steps from recliner to bed - held onto OT's forearms.     Vision Patient Visual Report: No change from baseline       Perception     Praxis      Pertinent Vitals/Pain Pain Assessment: 0-10 Pain Score: 10-Worst pain ever Pain Location: Right leg Pain Descriptors / Indicators: Grimacing;Guarding;Stabbing Pain Intervention(s): Monitored during session;Repositioned     Hand Dominance Right   Extremity/Trunk Assessment Upper Extremity Assessment Upper Extremity Assessment: Overall WFL for tasks assessed   Lower Extremity Assessment Lower Extremity Assessment: Defer to PT evaluation RLE Deficits / Details: Patient holding R leg straight and refuses to bend the leg due to bed.   Cervical /  Trunk Assessment Cervical / Trunk Assessment: Kyphotic   Communication Communication Communication: No difficulties   Cognition Arousal/Alertness: Awake/alert Behavior During Therapy: Restless;Anxious                                   General Comments: Patient over dramatizes pain.  Yells out.  States she cannot do anything.   General Comments       Exercises     Shoulder Instructions      Home Living Family/patient expects to be discharged to:: Private residence Living Arrangements: Spouse/significant other Available Help at Discharge:  Family;Available 24 hours/day Type of Home: House Home Access: Stairs to enter [employer] of Steps: 3 Entrance Stairs-Rails: None Home Layout: One level     Bathroom Shower/Tub: Occupational psychologist: Handicapped height     Home Equipment: Environmental consultant - 4 wheels;Cane - single point;Shower seat          Prior Functioning/Environment Level of Independence: Independent with assistive device(s)        Comments: Patient uses 4WRW.        OT Problem List: Decreased range of motion;Decreased activity tolerance;Impaired balance (sitting and/or standing);Decreased safety awareness;Pain;Increased edema      OT Treatment/Interventions: Self-care/ADL training;Therapeutic exercise;DME and/or AE instruction;Therapeutic activities;Balance training;Patient/family education    OT Goals(Current goals can be found in the care plan section) Acute Rehab OT Goals Patient Stated Goal: Stop hurting OT Goal Formulation: With patient Time For Goal Achievement: 10/18/20 Potential to Achieve Goals: Fair ADL Goals Pt Will Perform Grooming: with set-up;sitting Pt Will Perform Lower Body Bathing: with min assist;sit to/from stand Pt Will Perform Lower Body Dressing: with min assist;sit to/from stand Pt Will Transfer to Toilet: with supervision;stand pivot transfer Pt Will Perform Toileting - Clothing Manipulation and hygiene: with min guard assist;sit to/from stand  OT Frequency: Min 2X/week   Barriers to D/C: Decreased caregiver support          Co-evaluation              AM-PAC OT "6 Clicks" Daily Activity     Outcome Measure Help from another person eating meals?: A Little Help from another person taking care of personal grooming?: A Little Help from another person toileting, which includes using toliet, bedpan, or urinal?: A Lot Help from another person bathing (including washing, rinsing, drying)?: A Lot Help from another person to put on and taking off regular  upper body clothing?: A Little Help from another person to put on and taking off regular lower body clothing?: A Lot 6 Click Score: 15   End of Session Equipment Utilized During Treatment: Gait belt Nurse Communication: Mobility status  Activity Tolerance: Patient limited by pain Patient left: in bed;with call bell/phone within reach;with bed alarm set;with family/visitor present  OT Visit Diagnosis: Unsteadiness on feet (R26.81);Pain Pain - Right/Left: Right Pain - part of body: Leg                Time: 1315-1341 OT Time Calculation (min): 26 min Charges:  OT General Charges $OT Visit: 1 Visit OT Evaluation $OT Eval Moderate Complexity: 1 Mod  10/04/2020  Rich, OTR/L  Acute Rehabilitation Services  Office:  [place] 10/04/2020, 2:38 PM

## 2020-10-04 NOTE — Progress Notes (Addendum)
Received report from night RN and discovered that Provena wound vac was not plugged in and battery had died.  There is no seal and black foam is deflated. Promptly notified both charge nurse Tim, RN (night shift) Ronnette Juniper, RN (day shift) and Dr. Donzetta Matters, MD, who acknowledged information and said that vascular would be rounding soon.  Vascular PA Corrie Baglia rounded, PA.  It was discovered that Provena just needed to be turned on.  Working fine now, suction to 125. Per Vascular PA, Randol Kern, PA okay to discontinue Foley POD 1, per order.  Will try to get OOB hopefully with PT assistance.

## 2020-10-04 NOTE — Progress Notes (Signed)
PHARMACIST LIPID MONITORING   [name] [name] is a 74 y.o. female admitted on 10/03/2020 for revascularization.  Pharmacy has been consulted to optimize lipid-lowering therapy with the indication of secondary prevention for clinical ASCVD.  Recent Labs:  Lipid Panel (last 6 months):   Lab Results  Component Value Date   CHOL 103 10/04/2020   TRIG 151 (H) 10/04/2020   HDL 24 (L) 10/04/2020   CHOLHDL 4.3 10/04/2020   VLDL 30 10/04/2020   LDLCALC 49 10/04/2020    Hepatic function panel (last 6 months):   Lab Results  Component Value Date   AST 33 09/24/2020   ALT 17 09/26/2020   ALKPHOS 183 (H) 09/24/2020   BILITOT 0.6 09/24/2020    SCr (since admission):   Serum creatinine: 1.56 mg/dL (H) 10/04/20 0114 Estimated creatinine clearance: 30 mL/min (A)  Current lipid-lowering therapy: atorvastatin 80mg  Previous lipid-lowering therapies (if applicable): simvastatin 20mg  Documented or reported allergies or intolerances to lipid-lowering therapies (if applicable): None  Assessment: LDL is 49 on 10/9. Target LDL is <55. No change indicated at this time.  Recommendation per protocol:  Continue current lipid-lowering therapy.  Follow-up with:  Primary care provider - Danna Hefty, DO  Follow-up labs after discharge:    Liver function panel and lipid panel in 8-12 weeks then annually  Plan: Continue atorvastatin 80mg  daily  Amedeo Plenty, PharmD 10/04/2020, 2:03 PM

## 2020-10-04 NOTE — Evaluation (Signed)
Physical Therapy Evaluation Patient Details Name: [name] [name] MRN: [id number] DOB: 10-22-1946 Today's Date: 10/04/2020   History of Present Illness  The pt is a 74 yo female presenting s/p R femoral - popliteal artery bypass graft 10/9 due to progressive pain in her RLE. PMH includes: sucessful recanalization and stenting of L supeficial femoral arter in 2018, HLD, HTN, COPD, tobacco use, and DM II.      Clinical Impression  Pt in bed upon arrival of PT, agreeable to evaluation at this time. Prior to admission the pt was mobilizing with use of a rollator in the home with intermittent use of a cane, living in a home with 3 steps to enter with her husband, and independent with ADLs. The pt now presents with limitations in functional mobility, ROM, strength, power, and activity tolerance due to above dx and resulting pain, and will continue to benefit from skilled PT to address these deficits. The pt required significant extra time and encouragement for each movement, but was eventually able to progress to OOB transfers with use of RW and minA to power up and steady. The pt requires modA to complete short bouts of ambulation at this time with increased cues and intermittent manual facilitation of RLE to complete 3 ft ambulation to recliner. The pt reports she has inconsistent support at home from her children, and therefore may benefit from short stint of rehab prior to return home to facilitate improved activity tolerance and mobility.      Follow Up Recommendations SNF;Supervision/Assistance - 24 hour (may progress to home with HHPT)    Equipment Recommendations  Rolling walker with 5" wheels;3in1 (PT)    Recommendations for Other Services       Precautions / Restrictions Precautions Precautions: Fall Precaution Comments: wound vac R upper thigh Restrictions Weight Bearing Restrictions: No      Mobility  Bed Mobility Overal bed mobility: Needs Assistance Bed Mobility: Supine to  Sit     Supine to sit: Mod assist;HOB elevated     General bed mobility comments: modA to BLE to move towards EOB, then minA to steady trunk initially, progressed to supervision  Transfers Overall transfer level: Needs assistance Equipment used: Rolling walker (2 wheeled) Transfers: Sit to/from Stand;Lateral/Scoot Transfers Sit to Stand: Min assist;From elevated surface        Lateral/Scoot Transfers: Min assist General transfer comment: pt initially declining all mobility due to pain, with significant increased time and max cues, she was able to scoot laterally with good hip clearance, talked into attempt to stand. Able to power up very slowly but with minA only to rise and steady with RW  Ambulation/Gait Ambulation/Gait assistance: Mod assist Gait Distance (Feet): 3 Feet Assistive device: Rolling walker (2 wheeled) Gait Pattern/deviations: Step-to pattern;Decreased weight shift to right Gait velocity: decreased Gait velocity interpretation: <1.31 ft/sec, indicative of household ambulator General Gait Details: pt with minimal-no wt acceptance on RLE initially, improved with cues. mostly hop-to pattern to move laterally to recliner     Balance Overall balance assessment: Needs assistance Sitting-balance support: Bilateral upper extremity supported;Feet supported Sitting balance-Leahy Scale: Poor Sitting balance - Comments: supervision, reliant on UE support due to pain   Standing balance support: Bilateral upper extremity supported Standing balance-Leahy Scale: Poor Standing balance comment: BUE support and minA                             Pertinent Vitals/Pain Pain Assessment: Faces Faces Pain Scale:  Hurts whole lot Pain Location: incision, R groin Pain Descriptors / Indicators: Cramping;Grimacing;Moaning;Sharp Pain Intervention(s): Limited activity within patient's tolerance;Monitored during session;Repositioned;RN gave pain meds during session;Relaxation     Home Living Family/patient expects to be discharged to:: Private residence Living Arrangements: Spouse/significant other Available Help at Discharge: Family;Available 24 hours/day Type of Home: House Home Access: Stairs to enter Entrance Stairs-Rails: None Entrance Stairs-Number of Steps: 3 Home Layout: One level Home Equipment: Walker - 4 wheels;Cane - single point;Shower seat Additional Comments: Information mostly from husband during eval    Prior Function Level of Independence: Independent with assistive device(s)         Comments: pt reporting she typically does not use AD, but husband stating the pt uses rollator primarily.      Hand Dominance   Dominant Hand: Right    Extremity/Trunk Assessment   Upper Extremity Assessment Upper Extremity Assessment: Defer to OT evaluation    Lower Extremity Assessment Lower Extremity Assessment: Generalized weakness;RLE deficits/detail;LLE deficits/detail RLE Deficits / Details: pt unable to wt bear or perform active knee flexion or hip extension, small amount of active hip adduction, all pain limited. multiple incisions and wound vac present RLE: Unable to fully assess due to pain LLE Deficits / Details: pt able to use functionally in stance and to step, unable to perform AROM or PROM without significant pain in bed at any joint LLE: Unable to fully assess due to pain    Cervical / Trunk Assessment Cervical / Trunk Assessment: Kyphotic  Communication      Cognition Arousal/Alertness: Lethargic (increased alertness with mobility) Behavior During Therapy: Flat affect;Restless Overall Cognitive Status: Impaired/Different from baseline Area of Impairment: Safety/judgement;Awareness;Problem solving                         Safety/Judgement: Decreased awareness of safety;Decreased awareness of deficits Awareness: Emergent Problem Solving: Decreased initiation;Difficulty sequencing;Requires verbal cues General  Comments: Pt behavior waxing and waning through session, flat affect with eyes closed while therapist asking questions at times with pt moaning out in pain and very alert at other times. Intermittently incorrect answers to history questions with husband present to correct. Pt answering only "it hurts" to 70% of questions at end of session      General Comments General comments (skin integrity, edema, etc.): wounds uncovered but intact, pt limtied by significant pain but motivated to return home if possible        Assessment/Plan    PT Assessment Patient needs continued PT services  PT Problem List Decreased strength;Decreased range of motion;Decreased activity tolerance;Decreased balance;Decreased mobility;Decreased knowledge of use of DME;Decreased safety awareness;Pain       PT Treatment Interventions Gait training;DME instruction;Stair training;Functional mobility training;Therapeutic activities;Therapeutic exercise;Balance training;Patient/family education    PT Goals (Current goals can be found in the Care Plan section)  Acute Rehab PT Goals Patient Stated Goal: reduce pain PT Goal Formulation: With patient Time For Goal Achievement: 10/18/20 Potential to Achieve Goals: Good    Frequency Min 3X/week    AM-PAC PT "6 Clicks" Mobility  Outcome Measure Help needed turning from your back to your side while in a flat bed without using bedrails?: A Little Help needed moving from lying on your back to sitting on the side of a flat bed without using bedrails?: A Lot Help needed moving to and from a bed to a chair (including a wheelchair)?: A Lot Help needed standing up from a chair using your arms (e.g., wheelchair or bedside  chair)?: A Lot Help needed to walk in hospital room?: A Lot Help needed climbing 3-5 steps with a railing? : A Lot 6 Click Score: 13    End of Session Equipment Utilized During Treatment: Gait belt Activity Tolerance: Patient limited by pain Patient left: in  chair;with call bell/phone within reach;with family/visitor present Nurse Communication: Mobility status;Need for lift equipment (may benefit from use of stedy) PT Visit Diagnosis: Muscle weakness (generalized) (M62.81);Difficulty in walking, not elsewhere classified (R26.2);Pain Pain - Right/Left: Right Pain - part of body: Leg    Time: 9470-7615 PT Time Calculation (min) (ACUTE ONLY): 61 min   Charges:   PT Evaluation $PT Eval Moderate Complexity: 1 Mod PT Treatments $Gait Training: 8-22 mins $Therapeutic Activity: 23-37 mins        Karma Ganja, PT, DPT   Acute Rehabilitation Department Pager #: [phone]  Otho Bellows 10/04/2020, 12:39 PM

## 2020-10-05 LAB — GLUCOSE, CAPILLARY
Glucose-Capillary: 147 mg/dL — ABNORMAL HIGH (ref 70–99)
Glucose-Capillary: 117 mg/dL — ABNORMAL HIGH (ref 70–99)
Glucose-Capillary: 108 mg/dL — ABNORMAL HIGH (ref 70–99)
Glucose-Capillary: 118 mg/dL — ABNORMAL HIGH (ref 70–99)
Glucose-Capillary: 120 mg/dL — ABNORMAL HIGH (ref 70–99)

## 2020-10-05 NOTE — NC FL2 (Signed)
[place] MEDICAID FL2 LEVEL OF CARE SCREENING TOOL     IDENTIFICATION  Patient Name: [name] [name] Birthdate: [birth year] Sex: female Admission Date (Current Location): 10/03/2020  [facility] and [place] Number:  Herbalist and Address:  [facility], [place] [address], [place], [place] [address]      Provider Number: [id number]  Attending Physician Name and Address:  Serafina Mitchell, MD  Relative Name and Phone Number:  [name] [phone]    Current Level of Care: Hospital Recommended Level of Care: [place] Prior Approval Number:    Date Approved/Denied:   PASRR Number: Pending  Discharge Plan: SNF    Current Diagnoses: Patient Active Problem List   Diagnosis Date Noted  . Peripheral arterial disease ([place]) 10/03/2020  . (HFpEF) heart failure with preserved ejection fraction ([place]) 06/11/2020  . Venous insufficiency (chronic) (peripheral) 06/22/2019  . Pulmonary nodules 01/04/2018  . Mass of left lower leg 01/04/2018  . Chronic kidney disease (CKD), stage III (moderate) ([place]) 06/04/2016  . Chronic back pain 10/02/2015  . DM (diabetes mellitus), type 2 with peripheral vascular complications ([place]) 66/44/0347  . Chronic lymphocytic leukemia ([place]) 04/11/2009  . PERIPHERAL VASCULAR DISEASE 10/13/2007  . HYPERCHOLESTEROLEMIA 02/23/2007  . ANXIETY 02/23/2007  . TOBACCO DEPENDENCE 02/23/2007  . Essential hypertension 02/23/2007  . COPD (chronic obstructive pulmonary disease) ([place]) 02/23/2007  . GASTROESOPHAGEAL REFLUX, NO ESOPHAGITIS 02/23/2007    Orientation RESPIRATION BLADDER Height & Weight     Self, Situation, Place  Normal Continent Weight: 159 lb 13.3 oz (72.5 kg) Height:  5\' 2"  (157.5 cm)  BEHAVIORAL SYMPTOMS/MOOD NEUROLOGICAL BOWEL NUTRITION STATUS      Continent Diet (See Discharge summary)  AMBULATORY STATUS COMMUNICATION OF NEEDS Skin   Limited Assist Verbally Surgical wounds, Wound Vac (Pressure therapy,  surgical wounds right leg and groin)                       Personal Care Assistance Level of Assistance  Bathing, Feeding, Dressing Bathing Assistance: Maximum assistance Feeding assistance: Limited assistance Dressing Assistance: Maximum assistance     Functional Limitations Info  Sight, Hearing Sight Info: Adequate Hearing Info: Adequate      SPECIAL CARE FACTORS FREQUENCY  PT (By licensed PT), OT (By licensed OT)     PT Frequency: 5X per week OT Frequency: 5X per week            Contractures Contractures Info: Not present    Additional Factors Info  Code Status, Allergies Code Status Info: Full Allergies Info: Aspirin, Baclofen, Cabbage           Current Medications (10/05/2020):  This is the current hospital active medication list Current Facility-Administered Medications  Medication Dose Route Frequency Provider Last Rate Last Admin  . 0.9 %  sodium chloride infusion  500 mL Intravenous Once PRN Baglia, Corrina, PA-C      . 0.9 %  sodium chloride infusion   Intravenous Continuous Baglia, Corrina, PA-C 125 mL/hr at 10/04/20 1201 New Bag at 10/04/20 1201  . acetaminophen (TYLENOL) tablet 325-650 mg  325-650 mg Oral Q4H PRN Baglia, Corrina, PA-C       Or  . acetaminophen (TYLENOL) suppository 325-650 mg  325-650 mg Rectal Q4H PRN Baglia, Corrina, PA-C      . albuterol (PROVENTIL) (2.5 MG/3ML) 0.083% nebulizer solution 3 mL  3 mL Nebulization Q6H PRN Baglia, Corrina, PA-C      . albuterol (VENTOLIN HFA)  108 (90 Base) MCG/ACT inhaler 2 puff  2 puff Inhalation Q6H PRN Baglia, Corrina, PA-C      . alum & mag hydroxide-simeth (MAALOX/MYLANTA) 200-200-20 MG/5ML suspension 15-30 mL  15-30 mL Oral Q2H PRN Baglia, Corrina, PA-C      . aspirin EC tablet 81 mg  81 mg Oral Q0600 Baglia, Corrina, PA-C   81 mg at 10/05/20 0623  . atorvastatin (LIPITOR) tablet 80 mg  80 mg Oral Daily Baglia, Corrina, PA-C   80 mg at 10/05/20 0838  . Chlorhexidine Gluconate Cloth 2 % PADS 6  each  6 each Topical Daily Serafina Mitchell, MD   6 each at 10/05/20 1400  . clopidogrel (PLAVIX) tablet 75 mg  75 mg Oral Daily Baglia, Corrina, PA-C   75 mg at 10/05/20 0838  . cyclobenzaprine (FLEXERIL) tablet 10 mg  10 mg Oral TID PRN Baglia, Corrina, PA-C   10 mg at 10/04/20 0903  . docusate sodium (COLACE) capsule 100 mg  100 mg Oral Daily Baglia, Corrina, PA-C   100 mg at 10/05/20 0539  . DULoxetine (CYMBALTA) DR capsule 60 mg  60 mg Oral Daily Baglia, Corrina, PA-C   60 mg at 10/05/20 0838  . empagliflozin (JARDIANCE) tablet 10 mg  10 mg Oral Daily Baglia, Corrina, PA-C   10 mg at 10/05/20 7673  . fluticasone furoate-vilanterol (BREO ELLIPTA) 100-25 MCG/INH 1 puff  1 puff Inhalation Daily Baglia, Corrina, PA-C   1 puff at 10/05/20 0821  . furosemide (LASIX) tablet 20 mg  20 mg Oral Daily Baglia, Corrina, PA-C   20 mg at 10/05/20 4193  . gabapentin (NEURONTIN) capsule 300 mg  300 mg Oral TID Baglia, Corrina, PA-C   300 mg at 10/05/20 7902  . guaiFENesin-dextromethorphan (ROBITUSSIN DM) 100-10 MG/5ML syrup 15 mL  15 mL Oral Q4H PRN Baglia, Corrina, PA-C      . heparin injection 5,000 Units  5,000 Units Subcutaneous Q8H Baglia, Corrina, PA-C   5,000 Units at 10/05/20 1400  . hydrALAZINE (APRESOLINE) injection 5 mg  5 mg Intravenous Q20 Min PRN Baglia, Corrina, PA-C      . hydrALAZINE (APRESOLINE) tablet 50 mg  50 mg Oral TID Baglia, Corrina, PA-C   50 mg at 10/05/20 4097  . HYDROmorphone (DILAUDID) injection 0.5-1 mg  0.5-1 mg Intravenous Q2H PRN Baglia, Corrina, PA-C   1 mg at 10/05/20 0108  . insulin aspart (novoLOG) injection 0-15 Units  0-15 Units Subcutaneous TID WC Baglia, Corrina, PA-C   3 Units at 10/04/20 1657  . insulin aspart (novoLOG) injection 0-5 Units  0-5 Units Subcutaneous QHS Baglia, Corrina, PA-C      . labetalol (NORMODYNE) injection 10 mg  10 mg Intravenous Q10 min PRN Baglia, Corrina, PA-C      . magnesium sulfate IVPB 2 g 50 mL  2 g Intravenous Daily PRN Baglia, Corrina,  PA-C      . metFORMIN (GLUCOPHAGE) tablet 500 mg  500 mg Oral BID WC Baglia, Corrina, PA-C   500 mg at 10/05/20 3532  . metoprolol tartrate (LOPRESSOR) injection 2-5 mg  2-5 mg Intravenous Q2H PRN Baglia, Corrina, PA-C      . montelukast (SINGULAIR) tablet 10 mg  10 mg Oral QHS Baglia, Corrina, PA-C   10 mg at 10/04/20 2228  . nitroGLYCERIN (NITROSTAT) SL tablet 0.4 mg  0.4 mg Sublingual Q5 min PRN Baglia, Corrina, PA-C      . ondansetron (ZOFRAN) injection 4 mg  4 mg Intravenous Q6H PRN Baglia, Corrina, PA-C  4 mg at 10/04/20 1750  . oxyCODONE-acetaminophen (PERCOCET/ROXICET) 5-325 MG per tablet 1-2 tablet  1-2 tablet Oral Q4H PRN Baglia, Corrina, PA-C   2 tablet at 10/05/20 0838  . pantoprazole (PROTONIX) EC tablet 40 mg  40 mg Oral Daily Baglia, Corrina, PA-C   40 mg at 10/05/20 6861  . phenol (CHLORASEPTIC) mouth spray 1 spray  1 spray Mouth/Throat PRN Baglia, Corrina, PA-C      . potassium chloride SA (KLOR-CON) CR tablet 20-40 mEq  20-40 mEq Oral Daily PRN Baglia, Corrina, PA-C      . senna-docusate (Senokot-S) tablet 1 tablet  1 tablet Oral QHS PRN Baglia, Corrina, PA-C      . traMADol (ULTRAM) tablet 50 mg  50 mg Oral BID PRN Baglia, Corrina, PA-C   50 mg at 10/04/20 2227     Discharge Medications: Please see discharge summary for a list of discharge medications.  Relevant Imaging Results:  Relevant Lab Results:   Additional Information SSN# [id number]  [place], LCSW

## 2020-10-05 NOTE — TOC Initial Note (Signed)
Transition of Care [facility]) - Initial/Assessment Note    Patient Details  Name: [name] [name] MRN: [id number] Date of Birth: 03-17-1946  [facility]) CM/SW Contact:    Bary Castilla, LCSW Phone Number: [phone] 10/05/2020, 3:29 PM  Clinical Narrative:                  CSW met with patient and patient's spouse [name]) to discuss PT recommendation of a SNF. Patient was aware of recommendation and in agreement with going to a ST SNF. CSW discussed the SNF process.CSW provided patient with medicare.gov rating list. Patient gave CSW permission to fax referrals out to local facilities.CSW answered questions about the SNF process and the next steps in the process.Patient's preference is [facility].  TOC team will continue to assist with discharge planning needs.     Expected Discharge Plan: Skilled Nursing Facility Barriers to Discharge: Ship broker, Continued Medical Work up, SNF Pending bed offer   Patient Goals and CMS Choice Patient states their goals for this hospitalization and ongoing recovery are:: To be able to go home CMS Medicare.gov Compare Post Acute Care list provided to:: Patient    Expected Discharge Plan and Services Expected Discharge Plan: [place]       Living arrangements for the past 2 months: Single Family Home                                      Prior Living Arrangements/Services Living arrangements for the past 2 months: Single Family Home Lives with:: Self, Spouse Patient language and need for interpreter reviewed:: Yes                 Activities of Daily Living Home Assistive Devices/Equipment: CBG Meter, Eyeglasses, Cane (specify quad or straight), Walker (specify type) ADL Screening (condition at time of admission) Patient's cognitive ability adequate to safely complete daily activities?: Yes Is the patient deaf or have difficulty hearing?: No Does the patient have difficulty seeing, even  when wearing glasses/contacts?: No Does the patient have difficulty concentrating, remembering, or making decisions?: No Patient able to express need for assistance with ADLs?: Yes Does the patient have difficulty dressing or bathing?: No Independently performs ADLs?: Yes (appropriate for developmental age) Does the patient have difficulty walking or climbing stairs?: Yes Weakness of Legs: Right Weakness of Arms/Hands: None  Permission Sought/Granted   Permission granted to share information with : Yes, Verbal Permission Granted  Share Information with NAME: [name]  Permission granted to share info w AGENCY: SNFs  Permission granted to share info w Relationship: Spouse  Permission granted to share info w Contact Information: [phone]  Emotional Assessment Appearance:: Appears stated age Attitude/Demeanor/Rapport: Engaged Affect (typically observed): Appropriate, Calm Orientation: : Oriented to Self, Oriented to Place, Oriented to Situation      Admission diagnosis:  Peripheral arterial disease ([place]) [I73.9] Patient Active Problem List   Diagnosis Date Noted  . Peripheral arterial disease ([place]) 10/03/2020  . (HFpEF) heart failure with preserved ejection fraction ([place]) 06/11/2020  . Venous insufficiency (chronic) (peripheral) 06/22/2019  . Pulmonary nodules 01/04/2018  . Mass of left lower leg 01/04/2018  . Chronic kidney disease (CKD), stage III (moderate) ([place]) 06/04/2016  . Chronic back pain 10/02/2015  . DM (diabetes mellitus), type 2 with peripheral vascular complications ([place]) 81/12/7508  . Chronic lymphocytic leukemia ([place]) 04/11/2009  . PERIPHERAL VASCULAR DISEASE 10/13/2007  .  HYPERCHOLESTEROLEMIA 02/23/2007  . ANXIETY 02/23/2007  . TOBACCO DEPENDENCE 02/23/2007  . Essential hypertension 02/23/2007  . COPD (chronic obstructive pulmonary disease) ([place]) 02/23/2007  . GASTROESOPHAGEAL REFLUX, NO ESOPHAGITIS 02/23/2007   PCP:  Danna Hefty, DO Pharmacy:    [facility] [place] [address] [place] [address] Phone: [phone] Fax: [phone]  [facility] [address] ([address] [place] [address] [place] ([place] [address] Phone: [phone] Fax: [phone]     Social Determinants of Health (SDOH) Interventions    Readmission Risk Interventions No flowsheet data found.

## 2020-10-05 NOTE — Progress Notes (Addendum)
Progress Note    10/05/2020 9:31 AM 2 Days Post-Op  Subjective: complaining of Right foot pain   Vitals:   10/05/20 0819 10/05/20 0822  BP: 135/76   Pulse: 90   Resp: 18   Temp: 98.7 F (37.1 C)   SpO2: 97% 96%   Physical Exam: Cardiac: regular rate and rhythm Lungs: non labored Incisions:  Right groin with Prevena. Good seal. Right lower extremity incisions clean, dry and intact without swelling or hematoma. Extremities: 2+ femoral pulses, lower extremity well perfused and warm. Doppler PT/ Dp signal Neurologic: alert and oriented  CBC    Component Value Date/Time   WBC 123.3 (HH) 10/04/2020 0114   RBC 3.42 (L) 10/04/2020 0114   HGB 8.9 (L) 10/04/2020 0114   HGB 11.4 (L) 06/28/2019 1146   HGB 11.0 (L) 03/09/2019 1024   HGB 10.8 (L) 12/13/2017 1217   HCT 31.5 (L) 10/04/2020 0114   HCT 35.5 03/09/2019 1024   HCT 34.5 (L) 12/13/2017 1217   PLT 195 10/04/2020 0114   PLT 209 06/28/2019 1146   PLT 247 03/09/2019 1024   MCV 92.1 10/04/2020 0114   MCV 86 03/09/2019 1024   MCV 88.9 12/13/2017 1217   MCH 26.0 10/04/2020 0114   MCHC 28.3 (L) 10/04/2020 0114   RDW 20.4 (H) 10/04/2020 0114   RDW 16.0 (H) 03/09/2019 1024   RDW 16.5 (H) 12/13/2017 1217   LYMPHSABS 121.7 (H) 09/24/2020 0847   LYMPHSABS 44.2 (H) 12/13/2017 1217   MONOABS 33.4 (H) 09/24/2020 0847   MONOABS 2.3 (H) 12/13/2017 1217   EOSABS 0.3 09/24/2020 0847   EOSABS 0.1 12/13/2017 1217   BASOSABS 0.3 (H) 09/24/2020 0847   BASOSABS 0.4 (H) 12/13/2017 1217    BMET    Component Value Date/Time   NA 143 10/04/2020 0114   NA 140 06/16/2020 1010   NA 142 12/13/2017 1217   K 5.7 (H) 10/04/2020 0550   K 4.5 12/13/2017 1217   CL 107 10/04/2020 0114   CO2 29 10/04/2020 0114   CO2 30 (H) 12/13/2017 1217   GLUCOSE 125 (H) 10/04/2020 0114   GLUCOSE 98 12/13/2017 1217   BUN 15 10/04/2020 0114   BUN 16 06/16/2020 1010   BUN 10.2 12/13/2017 1217   CREATININE 1.56 (H) 10/04/2020 0114   CREATININE 1.62  (H) 08/05/2020 1324   CREATININE 1.3 (H) 12/13/2017 1217   CALCIUM 7.8 (L) 10/04/2020 0114   CALCIUM 8.9 12/13/2017 1217   GFRNONAA 33 (L) 10/04/2020 0114   GFRNONAA 31 (L) 08/05/2020 1324   GFRNONAA 45 (L) 06/04/2016 1634   GFRAA 34 (L) 09/24/2020 0847   GFRAA 36 (L) 08/05/2020 1324   GFRAA 52 (L) 06/04/2016 1634    INR    Component Value Date/Time   INR 0.9 09/24/2020 0847     Intake/Output Summary (Last 24 hours) at 10/05/2020 0931 Last data filed at 10/05/2020 0100 Gross per 24 hour  Intake 240 ml  Output 800 ml  Net -560 ml     Assessment/Plan:  74 y.o. female is s/p #1: Right femoral to below-knee popliteal artery bypass graft with 6 mm external ring PTFE #2: Right femoral endarterectomy extending into the profundofemoral artery with vein patch angioplasty #3: Right femoral Prevena wound VAC  2 Days Post- op. Doing well post op. RLE is well perfused and warm with Brisk doppler signals. Prevena VAC good seal. Encouraged OOB and mobilize today. dispo to SNF   DVT prophylaxis:  Sq. hep   Corrina Baglia, PA-C  [facility] [phone] 10/05/2020 9:31 AM   I have independently interviewed and examined patient and agree with PA assessment and plan above.   Leroi Haque C. Donzetta Matters, MD [facility] [place] Office: [phone] Pager: [phone]

## 2020-10-06 ENCOUNTER — Encounter (HOSPITAL_COMMUNITY): Payer: Self-pay | Admitting: Surgery

## 2020-10-06 LAB — BASIC METABOLIC PANEL
Anion gap: 10 (ref 5–15)
BUN: 13 mg/dL (ref 8–23)
CO2: 23 mmol/L (ref 22–32)
Calcium: 7.7 mg/dL — ABNORMAL LOW (ref 8.9–10.3)
Chloride: 110 mmol/L (ref 98–111)
Creatinine, Ser: 1.44 mg/dL — ABNORMAL HIGH (ref 0.44–1.00)
GFR, Estimated: 36 mL/min — ABNORMAL LOW (ref >60)
Glucose, Bld: 107 mg/dL — ABNORMAL HIGH (ref 70–99)
Potassium: 4.7 mmol/L (ref 3.5–5.1)
Sodium: 143 mmol/L (ref 135–145)

## 2020-10-06 LAB — GLUCOSE, CAPILLARY
Glucose-Capillary: 97 mg/dL (ref 70–99)
Glucose-Capillary: 108 mg/dL — ABNORMAL HIGH (ref 70–99)
Glucose-Capillary: 165 mg/dL — ABNORMAL HIGH (ref 70–99)
Glucose-Capillary: 148 mg/dL — ABNORMAL HIGH (ref 70–99)

## 2020-10-06 MED ORDER — TRAMADOL HCL 50 MG PO TABS: 50.0000 mg | ORAL_TABLET | Freq: Two times a day (BID) | ORAL | Status: DC | PRN

## 2020-10-06 MED ORDER — ACETAMINOPHEN 325 MG PO TABS
325.0000 mg | ORAL_TABLET | ORAL | Status: DC | PRN
  Administered 2020-10-06: 325 mg via ORAL

## 2020-10-06 MED ORDER — OXYCODONE HCL 5 MG PO TABS
5.0000 mg | ORAL_TABLET | ORAL | Status: DC | PRN
  Administered 2020-10-06 – 2020-10-07 (×2): 10 mg via ORAL
  Filled 2020-10-06 (×2): qty 2

## 2020-10-06 MED ORDER — ACETAMINOPHEN 650 MG RE SUPP: 325.0000 mg | RECTAL | Status: DC | PRN

## 2020-10-06 MED ORDER — ACETAMINOPHEN 500 MG PO TABS
500.0000 mg | ORAL_TABLET | Freq: Three times a day (TID) | ORAL | Status: DC
  Administered 2020-10-06 – 2020-10-08 (×6): 500 mg via ORAL
  Filled 2020-10-06 (×6): qty 1

## 2020-10-06 NOTE — Progress Notes (Addendum)
Progress Note    10/06/2020 7:42 AM 3 Days Post-Op  Subjective:  Right lower extremity pain. Has not ambulated   Vitals:   10/06/20 0400 10/06/20 0500  BP: (!) 172/74 (!) 164/70  Pulse: 83 83  Resp: 16 15  Temp:  98.6 F (37 C)  SpO2: 95% 95%   Physical Exam: Cardiac: regular Lungs:  Non labored Incisions: Right groin incision with prevena with good seal. Right popliteal incision clean, dry and intact Extremities:  Bilateral lower extremities well perfused and warm. 2+ femoral pulses. Doppler PT/DP right foot Neurologic: alert and oriented  CBC    Component Value Date/Time   WBC 123.3 (HH) 10/04/2020 0114   RBC 3.42 (L) 10/04/2020 0114   HGB 8.9 (L) 10/04/2020 0114   HGB 11.4 (L) 06/28/2019 1146   HGB 11.0 (L) 03/09/2019 1024   HGB 10.8 (L) 12/13/2017 1217   HCT 31.5 (L) 10/04/2020 0114   HCT 35.5 03/09/2019 1024   HCT 34.5 (L) 12/13/2017 1217   PLT 195 10/04/2020 0114   PLT 209 06/28/2019 1146   PLT 247 03/09/2019 1024   MCV 92.1 10/04/2020 0114   MCV 86 03/09/2019 1024   MCV 88.9 12/13/2017 1217   MCH 26.0 10/04/2020 0114   MCHC 28.3 (L) 10/04/2020 0114   RDW 20.4 (H) 10/04/2020 0114   RDW 16.0 (H) 03/09/2019 1024   RDW 16.5 (H) 12/13/2017 1217   LYMPHSABS 121.7 (H) 09/24/2020 0847   LYMPHSABS 44.2 (H) 12/13/2017 1217   MONOABS 33.4 (H) 09/24/2020 0847   MONOABS 2.3 (H) 12/13/2017 1217   EOSABS 0.3 09/24/2020 0847   EOSABS 0.1 12/13/2017 1217   BASOSABS 0.3 (H) 09/24/2020 0847   BASOSABS 0.4 (H) 12/13/2017 1217    BMET    Component Value Date/Time   NA 143 10/06/2020 0222   NA 140 06/16/2020 1010   NA 142 12/13/2017 1217   K 4.7 10/06/2020 0222   K 4.5 12/13/2017 1217   CL 110 10/06/2020 0222   CO2 23 10/06/2020 0222   CO2 30 (H) 12/13/2017 1217   GLUCOSE 107 (H) 10/06/2020 0222   GLUCOSE 98 12/13/2017 1217   BUN 13 10/06/2020 0222   BUN 16 06/16/2020 1010   BUN 10.2 12/13/2017 1217   CREATININE 1.44 (H) 10/06/2020 0222   CREATININE  1.62 (H) 08/05/2020 1324   CREATININE 1.3 (H) 12/13/2017 1217   CALCIUM 7.7 (L) 10/06/2020 0222   CALCIUM 8.9 12/13/2017 1217   GFRNONAA 36 (L) 10/06/2020 0222   GFRNONAA 31 (L) 08/05/2020 1324   GFRNONAA 45 (L) 06/04/2016 1634   GFRAA 34 (L) 09/24/2020 0847   GFRAA 36 (L) 08/05/2020 1324   GFRAA 52 (L) 06/04/2016 1634    INR    Component Value Date/Time   INR 0.9 09/24/2020 0847     Intake/Output Summary (Last 24 hours) at 10/06/2020 0742 Last data filed at 10/05/2020 1650 Gross per 24 hour  Intake --  Output 1100 ml  Net -1100 ml     Assessment/Plan:  74 y.o. female is s/p  #1: Right femoral to below-knee popliteal artery bypass graft with 6 mm external ring PTFE #2: Right femoral endarterectomy extending into the profundofemoral artery with vein patch angioplasty #3: Right femoral Prevena wound VAC2 Days Post- op. Doing well post op. RLE is well perfused and warm with Brisk doppler signals. Prevena VAC good seal. 4 Days Post-Op  Really needs to get oob and mobilize. Pending SNF placement  DVT prophylaxis:  Sq heparin  [name], PA-C [facility] [phone] 10/06/2020 7:42 AM   I agree with the above.  I have seen and evaluated the patient.  She continues to have issues with pain.  She has done very minimal ambulation.  We will try to mobilize her as best we can.  Anticipate discharge to SNF in the near future.  Annamarie Major

## 2020-10-06 NOTE — Progress Notes (Signed)
Mobility Specialist - Progress Note   10/06/20 1541  Mobility  Activity Ambulated in room  Level of Assistance Modified independent, requires aide device or extra time  Assistive Device Front wheel walker  Distance Ambulated (ft) 20 ft  Mobility Response Tolerated well  Mobility performed by Mobility specialist  $Mobility charge 1 Mobility    Pre-mobility: 83 HR, 143/59 BP, 96% SpO2 During mobility: 110 HR Post-mobility: 84 HR, 153/74 BP, 97% SpO2  Pt asx during ambulation. Pt back in bed after walk.   Pricilla Handler Mobility Specialist Mobility Specialist Phone: [phone]

## 2020-10-06 NOTE — Progress Notes (Signed)
RE: [name] [name]. Mollenkopf  Date of Birth: 05-15-1946  Date: 10/06/2020  To Whom It May Concern:  Please be advised that the above-named patient will require a short-term nursing home stay - anticipated 30 days or less for rehabilitation and strengthening. The plan is for return home.

## 2020-10-06 NOTE — TOC Progression Note (Addendum)
Transition of Care [facility]) - Progression Note    Patient Details  Name: [name] [name] MRN: [id number] Date of Birth: 01-Jun-1946  Transition of Care [facility]) CM/SW [place], [place] Phone Number: 10/06/2020, 3:54 PM  Clinical Narrative:     CSW spoke with patient and patients spouse and they want to look over SNF bed offers and give SNF choice tomorrow. CSW let patient know she will follow up with her as well as spouse for SNF choice tomorrow. CSW started insurance authorization for patient.Insurance authorization requested start date is for tomorrow 10/12. Insurance authorization reference number is [id number]. Clinicals were submitted to patients insurance for review.  Pending SNF choice. Pending insurance authorization. Pending [facility].  CSW will continue to follow.  Expected Discharge Plan: Skilled Nursing Facility Barriers to Discharge: Ship broker, Continued Medical Work up, SNF Pending bed offer  Expected Discharge Plan and Services Expected Discharge Plan: Section arrangements for the past 2 months: Single Family Home                                       Social Determinants of Health (SDOH) Interventions    Readmission Risk Interventions No flowsheet data found.

## 2020-10-06 NOTE — Progress Notes (Signed)
Physical Therapy Treatment Patient Details Name: [name] [name] MRN: [id number] DOB: 02-01-1946 Today's Date: 10/06/2020    History of Present Illness The pt is a 74 yo female presenting s/p R femoral - popliteal artery bypass graft 10/9 due to progressive pain in her RLE. PMH includes: sucessful recanalization and stenting of L supeficial femoral arter in 2018, HLD, HTN, COPD, tobacco use, and DM II.      PT Comments    Pt pleasant, in bed on arrival and wanting to get up for toileting. Pt with maintained RLE pain but able to progress with transfers and gait this session. Pt reports she normally has to assist spouse and he cannot provide significant assist at home. Pt educated for HEP, ROM, transfers and gait and encouraged to continue mobility particularly for toileting acutely.  HR 91 SpO2 94% on RA    Follow Up Recommendations  SNF;Supervision/Assistance - 24 hour     Equipment Recommendations  Rolling walker with 5" wheels;3in1 (PT)    Recommendations for Other Services       Precautions / Restrictions Precautions Precautions: Fall Precaution Comments: VAC Restrictions Weight Bearing Restrictions: No    Mobility  Bed Mobility Overal bed mobility: Needs Assistance Bed Mobility: Supine to Sit     Supine to sit: Min assist;HOB elevated     General bed mobility comments: HOB 20 degrees with cues for sequence, increased time and min assist to fully elevate trunk from surface  Transfers Overall transfer level: Needs assistance   Transfers: Sit to/from Stand Sit to Stand: Min guard         General transfer comment: cues for hand placement to stand from bed and BSC  Ambulation/Gait Ambulation/Gait assistance: Min assist Gait Distance (Feet): 15 Feet Assistive device: Rolling walker (2 wheeled) Gait Pattern/deviations: Step-to pattern;Decreased stride length;Trunk flexed   Gait velocity interpretation: 1.31 - 2.62 ft/sec, indicative of limited community  ambulator General Gait Details: cues for sequence with limited stance on RLE, short steps and cues for pusture and assist to direct RW. Pt walked 5' then 15'   Stairs             Wheelchair Mobility    Modified Rankin (Stroke Patients Only)       Balance Overall balance assessment: Needs assistance   Sitting balance-Leahy Scale: Good     Standing balance support: Bilateral upper extremity supported Standing balance-Leahy Scale: Poor Standing balance comment: bil UE support on RW                            Cognition Arousal/Alertness: Awake/alert Behavior During Therapy: WFL for tasks assessed/performed Overall Cognitive Status: Within Functional Limits for tasks assessed                                        Exercises General Exercises - Lower Extremity Long Arc Quad: AROM;Both;10 reps;Seated Hip Flexion/Marching: AAROM;Both;Seated;10 reps    General Comments        Pertinent Vitals/Pain Pain Score: 6  Pain Location: Right leg Pain Descriptors / Indicators: Grimacing;Guarding;Stabbing Pain Intervention(s): Limited activity within patient's tolerance;Monitored during session;Premedicated before session;Repositioned    Home Living                      Prior Function            PT Goals (  current goals can now be found in the care plan section) Progress towards PT goals: Progressing toward goals    Frequency    Min 3X/week      PT Plan Current plan remains appropriate    Co-evaluation              AM-PAC PT "6 Clicks" Mobility   Outcome Measure  Help needed turning from your back to your side while in a flat bed without using bedrails?: A Little Help needed moving from lying on your back to sitting on the side of a flat bed without using bedrails?: A Little Help needed moving to and from a bed to a chair (including a wheelchair)?: A Little Help needed standing up from a chair using your arms (e.g.,  wheelchair or bedside chair)?: A Little Help needed to walk in hospital room?: A Little Help needed climbing 3-5 steps with a railing? : A Lot 6 Click Score: 17    End of Session   Activity Tolerance: Patient tolerated treatment well Patient left: in chair;with call bell/phone within reach;with chair alarm set Nurse Communication: Mobility status PT Visit Diagnosis: Muscle weakness (generalized) (M62.81);Difficulty in walking, not elsewhere classified (R26.2);Pain     Time: 6270-3500 PT Time Calculation (min) (ACUTE ONLY): 33 min  Charges:  $Gait Training: 8-22 mins $Therapeutic Exercise: 8-22 mins                     Ander Wamser P, PT Acute Rehabilitation Services Pager: [phone] Office: [place] 10/06/2020, 1:04 PM

## 2020-10-07 LAB — BASIC METABOLIC PANEL
Anion gap: 8 (ref 5–15)
BUN: 12 mg/dL (ref 8–23)
CO2: 26 mmol/L (ref 22–32)
Calcium: 8.3 mg/dL — ABNORMAL LOW (ref 8.9–10.3)
Chloride: 109 mmol/L (ref 98–111)
Creatinine, Ser: 1.43 mg/dL — ABNORMAL HIGH (ref 0.44–1.00)
GFR, Estimated: 36 mL/min — ABNORMAL LOW (ref >60)
Glucose, Bld: 123 mg/dL — ABNORMAL HIGH (ref 70–99)
Potassium: 4 mmol/L (ref 3.5–5.1)
Sodium: 143 mmol/L (ref 135–145)

## 2020-10-07 LAB — GLUCOSE, CAPILLARY
Glucose-Capillary: 113 mg/dL — ABNORMAL HIGH (ref 70–99)
Glucose-Capillary: 145 mg/dL — ABNORMAL HIGH (ref 70–99)
Glucose-Capillary: 103 mg/dL — ABNORMAL HIGH (ref 70–99)
Glucose-Capillary: 130 mg/dL — ABNORMAL HIGH (ref 70–99)

## 2020-10-07 NOTE — Progress Notes (Signed)
Occupational Therapy Treatment Patient Details Name: [name] [name] MRN: [id number] DOB: 1946-08-12 Today's Date: 10/07/2020    History of present illness The pt is a 74 yo female presenting s/p R femoral - popliteal artery bypass graft 10/9 due to progressive pain in her RLE. PMH includes: sucessful recanalization and stenting of L supeficial femoral arter in 2018, HLD, HTN, COPD, tobacco use, and DM II.     OT comments  Pt. Was ed on increasing I and safety with ADLs and mobility. Pt. Was ed on cross leg technique to increase ability with LE ADLs. Pt. Was ed on proper hand placement for sit to stand and stand to sit.Marland Kitchen Pt. States that she will be going home tomorrow and wants [facility] therapy. Pt. To continue to be followed by OT until dc.    Home health OT    Equipment Recommendations  3 in 1 bedside commode    Recommendations for Other Services      Precautions / Restrictions Precautions Precautions: Fall Restrictions Weight Bearing Restrictions: No       Mobility Bed Mobility                  Transfers Overall transfer level: Needs assistance Equipment used: Rolling walker (2 wheeled) Transfers: Sit to/from Stand Sit to Stand: Min guard Stand pivot transfers: Min guard            Balance     Sitting balance-Leahy Scale: Good       Standing balance-Leahy Scale: Fair                             ADL either performed or assessed with clinical judgement   ADL Overall ADL's : Needs assistance/impaired Eating/Feeding: Independent   Grooming: Set up;Sitting               Lower Body Dressing: Moderate assistance;Sit to/from stand   Toilet Transfer: Min guard;BSC;RW           Functional mobility during ADLs: Min guard;Rolling walker General ADL Comments: Pt. worked on LE ADLs using cross leg technique.      Vision   Vision Assessment?: No apparent visual deficits   Perception     Praxis      Cognition Arousal/Alertness:  Awake/alert Behavior During Therapy: WFL for tasks assessed/performed Overall Cognitive Status: Within Functional Limits for tasks assessed                                          Exercises     Shoulder Instructions       General Comments      Pertinent Vitals/ Pain       Pain Assessment: 0-10 Pain Score: 4  Pain Location: Right leg Pain Descriptors / Indicators: Aching Pain Intervention(s): Limited activity within patient's tolerance  Home Living                                          Prior Functioning/Environment              Frequency  Min 2X/week        Progress Toward Goals  OT Goals(current goals can now be found in the care plan section)  Progress towards OT goals: Progressing toward  goals  Acute Rehab OT Goals Patient Stated Goal: to go home OT Goal Formulation: With patient Time For Goal Achievement: 10/18/20 Potential to Achieve Goals: Good ADL Goals Pt Will Perform Grooming: with set-up;sitting Pt Will Perform Lower Body Bathing: with min assist;sit to/from stand Pt Will Perform Lower Body Dressing: with min assist;sit to/from stand Pt Will Transfer to Toilet: with supervision;stand pivot transfer Pt Will Perform Toileting - Clothing Manipulation and hygiene: with min guard assist;sit to/from stand  Plan Discharge plan remains appropriate    Co-evaluation                 AM-PAC OT "6 Clicks" Daily Activity     Outcome Measure   Help from another person eating meals?: None Help from another person taking care of personal grooming?: A Little Help from another person toileting, which includes using toliet, bedpan, or urinal?: A Little Help from another person bathing (including washing, rinsing, drying)?: A Lot Help from another person to put on and taking off regular upper body clothing?: A Little Help from another person to put on and taking off regular lower body clothing?: A Lot 6 Click Score:  17    End of Session Equipment Utilized During Treatment: Gait belt;Rolling walker  OT Visit Diagnosis: Unsteadiness on feet (R26.81);Pain   Activity Tolerance Patient tolerated treatment well   Patient Left in chair;with call bell/phone within reach;with family/visitor present   Nurse Communication Mobility status        Time: 1203-1232 OT Time Calculation (min): 29 min  Charges: OT General Charges $OT Visit: 1 Visit OT Treatments $Self Care/Home Management : 8-22 mins $Therapeutic Activity: 8-22 mins  Reece Packer OT/L    Jastin Fore 10/07/2020, 12:44 PM

## 2020-10-07 NOTE — Progress Notes (Signed)
PT Cancellation Note  Patient Details Name: [name] [name] MRN: [id number] DOB: 03-15-1946   Cancelled Treatment:    Reason Eval/Treat Not Completed: (P) Other (comment) (OT present with patient this am, will f/u later for d/c rec planning.)   Sunshine Mackowski Eli Hose 10/07/2020, 12:10 PM  Erasmo Leventhal , PTA Acute Rehabilitation Services Pager [phone] Office [phone]

## 2020-10-07 NOTE — Progress Notes (Addendum)
Physical Therapy Treatment Patient Details Name: [name] [name] MRN: [id number] DOB: 02-07-1946 Today's Date: 10/07/2020    History of Present Illness The pt is a 74 yo female presenting s/p R femoral - popliteal artery bypass graft 10/9 due to progressive pain in her RLE. PMH includes: sucessful recanalization and stenting of L supeficial femoral arter in 2018, HLD, HTN, COPD, tobacco use, and DM II.      PT Comments    Pt supine in bed on arrival in good spirits. Pt continues to progress well.  Pt tolerated supine LE strengthening and increased gt distance.  Pt also performed stair training.  Based on progress will update recommendation to HHPT at this time.  Will inform supervising PT of need for change in recommendations at this time.      Follow Up Recommendations  Home health PT     Equipment Recommendations  Rolling walker with 5" wheels;3in1 (PT)    Recommendations for Other Services       Precautions / Restrictions Precautions Precautions: Fall Precaution Comments: VAC Restrictions Weight Bearing Restrictions: No    Mobility  Bed Mobility Overal bed mobility: Needs Assistance Bed Mobility: Supine to Sit     Supine to sit: Supervision     General bed mobility comments: Increased time but no assistance needed.  Transfers Overall transfer level: Needs assistance Equipment used: Rolling walker (2 wheeled) Transfers: Sit to/from Stand Sit to Stand: Min guard;Min assist Stand pivot transfers: Min guard       General transfer comment: Cues for hand placement to and from seated surface.  Increased to min assistance to rise from commode.  Cues for advancement of RLE forward to reduce pain  Ambulation/Gait Ambulation/Gait assistance: Min guard Gait Distance (Feet): 200 Feet Assistive device: Rolling walker (2 wheeled) Gait Pattern/deviations: Decreased stride length;Trunk flexed;Step-through pattern Gait velocity: decreased   General Gait Details: Pt  progressed to step through pattern with little to no guarding of RLE.  Cues for RW safety.  Increased gt distance significantly,.   Stairs Stairs: Yes Stairs assistance: Min assist Stair Management: No rails Number of Stairs: 4 General stair comments: HHA with cues for sequencing.   Wheelchair Mobility    Modified Rankin (Stroke Patients Only)       Balance Overall balance assessment: Needs assistance Sitting-balance support: Bilateral upper extremity supported;Feet supported Sitting balance-Leahy Scale: Good Sitting balance - Comments: leans to L and back.     Standing balance-Leahy Scale: Fair Standing balance comment: bil UE support on RW                            Cognition Arousal/Alertness: Awake/alert Behavior During Therapy: WFL for tasks assessed/performed Overall Cognitive Status: Within Functional Limits for tasks assessed                                        Exercises General Exercises - Lower Extremity Ankle Circles/Pumps: AROM;Both;10 reps;Supine Quad Sets: AROM;Both;10 reps;Supine Heel Slides: AROM;AAROM;Both;10 reps;Supine Hip ABduction/ADduction: AROM;AAROM;Both;10 reps;Supine Straight Leg Raises: AROM;AAROM;Both;10 reps;Supine    General Comments        Pertinent Vitals/Pain Pain Assessment: 0-10 Pain Score: 8  Pain Location: Right leg Pain Descriptors / Indicators: Aching Pain Intervention(s): Monitored during session;Repositioned    Home Living  Prior Function            PT Goals (current goals can now be found in the care plan section) Acute Rehab PT Goals Patient Stated Goal: to go home Potential to Achieve Goals: Good Progress towards PT goals: Progressing toward goals    Frequency    Min 3X/week      PT Plan Current plan remains appropriate    Co-evaluation              AM-PAC PT "6 Clicks" Mobility   Outcome Measure  Help needed turning from your  back to your side while in a flat bed without using bedrails?: A Little Help needed moving from lying on your back to sitting on the side of a flat bed without using bedrails?: A Little Help needed moving to and from a bed to a chair (including a wheelchair)?: A Little Help needed standing up from a chair using your arms (e.g., wheelchair or bedside chair)?: A Little Help needed to walk in hospital room?: A Little Help needed climbing 3-5 steps with a railing? : A Lot 6 Click Score: 17    End of Session Equipment Utilized During Treatment: Gait belt Activity Tolerance: Patient tolerated treatment well Patient left: with call bell/phone within reach;in bed;with bed alarm set (sitting edge of bed.) Nurse Communication: Mobility status PT Visit Diagnosis: Muscle weakness (generalized) (M62.81);Difficulty in walking, not elsewhere classified (R26.2);Pain Pain - Right/Left: Right Pain - part of body: Leg     Time: 8264-1583 PT Time Calculation (min) (ACUTE ONLY): 32 min  Charges:  $Gait Training: 8-22 mins $Therapeutic Exercise: 8-22 mins                     Erasmo Leventhal , PTA Acute Rehabilitation Services Pager [phone] Office [phone]     [name] [name] 10/07/2020, 2:58 PM

## 2020-10-07 NOTE — Progress Notes (Signed)
Mobility Specialist - Progress Note   10/07/20 1515  Mobility  Activity Ambulated in hall  Level of Assistance Modified independent, requires aide device or extra time  Assistive Device Front wheel walker  Distance Ambulated (ft) 230 ft  Mobility Response Tolerated well  Mobility performed by Mobility specialist  $Mobility charge 1 Mobility    Pre-mobility: 72 HR During mobility: 98 HR Post-mobility: 78 HR  Pt asx throughout ambulation. Pt back in bed after walk.   Pricilla Handler Mobility Specialist Mobility Specialist Phone: [phone]

## 2020-10-07 NOTE — TOC Progression Note (Signed)
Transition of Care (TOC) - Progression Note  [name] RN, BSN Transitions of Care Unit 4E- RN Case Manager See Treatment Team for direct phone #    Patient Details  Name: [name] MRN: [id number] Date of Birth: [birth year]  Transition of Care [facility]) CM/SW Contact  Dahlia Client, Romeo Rabon, RN Phone Number: 10/07/2020, 1:12 PM  Clinical Narrative:    CM and CSW-Cynthia J spoke with pt and spouse at the bedside regarding transition plans. Per pt she states that she does not want to go to [facility] and would prefer at this time to return home with [facility]. Will have PT/OT see pt to re-eval pt for safety to return home and recommendations for DME- pt reports she has cane and walker but states she would need 3n1 for home.  Pt is reporting that she will have her children and grandchildren available to assist as needed in addition to her spouse.  Discussed with pt the pre-op referral by Vascular to [facility] for any HH needs- pt is agreeable to this and does not have a preference for another [facility] agency.  Will await PT/OT to see pt again today, and f/u for transition of care needs.  At this time plan to return home with [facility] services as pt is refusing SNF.  Pt will need HH orders and DME order for 3n1.    Expected Discharge Plan: Skilled Nursing Facility Barriers to Discharge: Ship broker, Continued Medical Work up, SNF Pending bed offer  Expected Discharge Plan and Services Expected Discharge Plan: [place] In-house Referral: Clinical Social Work Discharge Planning Services: CM Consult Post Acute Care Choice: Home Health, Durable Medical Equipment Living arrangements for the past 2 months: [place]                 DME Arranged: 3-N-1 DME Agency: [place] Determinants of Health (SDOH) Interventions    Readmission Risk Interventions No flowsheet data found.

## 2020-10-07 NOTE — Progress Notes (Addendum)
Progress Note    10/07/2020 7:18 AM 4 Days Post-Op  Subjective:  Says she has some soreness in the right leg around incisions.  Says she walked to the door and back yesterday and felt like she did ok.   Afebrile HR  70's-80's NSR 993'Z-169'C systolic 78% RA  Vitals:   10/07/20 0014 10/07/20 0415  BP: 130/63 140/64  Pulse: 78 76  Resp: 13 14  Temp: 97.7 F (36.5 C) 98.9 F (37.2 C)  SpO2: 98% 96%    Physical Exam: Cardiac:  regular Lungs:  Non labored Incisions:  All are clean and dry; right groin with Pravena in place Extremities:  Brisk doppler signals right DP/PT   CBC    Component Value Date/Time   WBC 123.3 (HH) 10/04/2020 0114   RBC 3.42 (L) 10/04/2020 0114   HGB 8.9 (L) 10/04/2020 0114   HGB 11.4 (L) 06/28/2019 1146   HGB 11.0 (L) 03/09/2019 1024   HGB 10.8 (L) 12/13/2017 1217   HCT 31.5 (L) 10/04/2020 0114   HCT 35.5 03/09/2019 1024   HCT 34.5 (L) 12/13/2017 1217   PLT 195 10/04/2020 0114   PLT 209 06/28/2019 1146   PLT 247 03/09/2019 1024   MCV 92.1 10/04/2020 0114   MCV 86 03/09/2019 1024   MCV 88.9 12/13/2017 1217   MCH 26.0 10/04/2020 0114   MCHC 28.3 (L) 10/04/2020 0114   RDW 20.4 (H) 10/04/2020 0114   RDW 16.0 (H) 03/09/2019 1024   RDW 16.5 (H) 12/13/2017 1217   LYMPHSABS 121.7 (H) 09/24/2020 0847   LYMPHSABS 44.2 (H) 12/13/2017 1217   MONOABS 33.4 (H) 09/24/2020 0847   MONOABS 2.3 (H) 12/13/2017 1217   EOSABS 0.3 09/24/2020 0847   EOSABS 0.1 12/13/2017 1217   BASOSABS 0.3 (H) 09/24/2020 0847   BASOSABS 0.4 (H) 12/13/2017 1217    BMET    Component Value Date/Time   NA 143 10/07/2020 0131   NA 140 06/16/2020 1010   NA 142 12/13/2017 1217   K 4.0 10/07/2020 0131   K 4.5 12/13/2017 1217   CL 109 10/07/2020 0131   CO2 26 10/07/2020 0131   CO2 30 (H) 12/13/2017 1217   GLUCOSE 123 (H) 10/07/2020 0131   GLUCOSE 98 12/13/2017 1217   BUN 12 10/07/2020 0131   BUN 16 06/16/2020 1010   BUN 10.2 12/13/2017 1217   CREATININE 1.43 (H)  10/07/2020 0131   CREATININE 1.62 (H) 08/05/2020 1324   CREATININE 1.3 (H) 12/13/2017 1217   CALCIUM 8.3 (L) 10/07/2020 0131   CALCIUM 8.9 12/13/2017 1217   GFRNONAA 36 (L) 10/07/2020 0131   GFRNONAA 31 (L) 08/05/2020 1324   GFRNONAA 45 (L) 06/04/2016 1634   GFRAA 34 (L) 09/24/2020 0847   GFRAA 36 (L) 08/05/2020 1324   GFRAA 52 (L) 06/04/2016 1634    INR    Component Value Date/Time   INR 0.9 09/24/2020 0847     Intake/Output Summary (Last 24 hours) at 10/07/2020 0718 Last data filed at 10/06/2020 2015 Gross per 24 hour  Intake --  Output 1400 ml  Net -1400 ml     Assessment:  74 y.o. female is s/p:  #1: Right femoral to below-knee popliteal artery bypass graft with 6 mm external ring PTFE #2: Right femoral endarterectomy extending into the profundofemoral artery with vein patch angioplasty #3: Right femoral Prevena wound   4 Days Post-Op  Plan: -pt with brisk doppler signals right foot.   -incisions are clean and right groin with Pravena vac in  place with good seal.   -ambulated to the door yesterday-needs to mobilize more today. -for SNF to transition to home to gain strength and endurance. -DVT prophylaxis:  Sq heparin   Leontine Locket, PA-C [facility] [phone] 10/07/2020 7:18 AM  I agree with the above.  I have seen and evaluated the patient.  She was able to ambulate yesterday and is feeling much better today.  Her husband is at the bedside today and she really wants to go home.  She feels like she will have adequate health.  We will discuss with case management and PT this afternoon to see if this is possible and anticipate discharge to home tomorrow  Annamarie Major

## 2020-10-08 ENCOUNTER — Encounter: Payer: Medicare Other | Admitting: Physical Medicine and Rehabilitation

## 2020-10-08 ENCOUNTER — Encounter (HOSPITAL_COMMUNITY): Payer: Medicare Other

## 2020-10-08 LAB — BASIC METABOLIC PANEL
Anion gap: 7 (ref 5–15)
BUN: 12 mg/dL (ref 8–23)
CO2: 27 mmol/L (ref 22–32)
Calcium: 8.4 mg/dL — ABNORMAL LOW (ref 8.9–10.3)
Chloride: 105 mmol/L (ref 98–111)
Creatinine, Ser: 1.37 mg/dL — ABNORMAL HIGH (ref 0.44–1.00)
GFR, Estimated: 38 mL/min — ABNORMAL LOW (ref >60)
Glucose, Bld: 125 mg/dL — ABNORMAL HIGH (ref 70–99)
Potassium: 4.4 mmol/L (ref 3.5–5.1)
Sodium: 139 mmol/L (ref 135–145)

## 2020-10-08 LAB — GLUCOSE, CAPILLARY
Glucose-Capillary: 139 mg/dL — ABNORMAL HIGH (ref 70–99)
Glucose-Capillary: 117 mg/dL — ABNORMAL HIGH (ref 70–99)

## 2020-10-08 NOTE — TOC Transition Note (Signed)
Transition of Care (TOC) - CM/SW Discharge Note [name] RN, BSN Transitions of Care Unit 4E- RN Case Manager See Treatment Team for direct phone #    Patient Details  Name: [name] [name] MRN: [id number] Date of Birth: 05-16-1946  Transition of Care [facility]) CM/SW Contact:  Dawayne Patricia, RN Phone Number: 10/08/2020, 11:45 AM   Clinical Narrative:    Pt stable for transition home today, will go home with [place], [place] has been arranged with [facility] -PT/OT- Tiffany with [facility] has been notified of discharge home today- they will contact pt post discharge for [facility] arrangements.  DME has been ordered- 3n1 and RW- call has been made to [employer] for delivery- per bedside RN- DME has been delivered to the room.   Pt to return home with family.    Final next level of care: [place] Barriers to Discharge: Barriers Resolved   Patient Goals and CMS Choice Patient states their goals for this hospitalization and ongoing recovery are:: To be able to go home CMS Medicare.gov Compare Post Acute Care list provided to:: Patient Choice offered to / list presented to : Patient  Discharge Placement               Home with [facility]        Discharge Plan and Services In-house Referral: Clinical Social Work Discharge Planning Services: CM Consult Post Acute Care Choice: Home Health, Durable Medical Equipment          DME Arranged: [place] rolling DME Agency: [employer] Date DME Agency Contacted: 10/08/20 Time DME Agency Contacted: [phone] Representative spoke with at DME Agency: Adela Lank HH Arranged: PT, OT [place] Agency: [facility] Date [place]: 10/07/20 Time [place]: 3428 Representative spoke with at [place]: [place] ([place]) Interventions     Readmission Risk Interventions Readmission Risk Prevention Plan 10/08/2020  Transportation Screening Complete  PCP or Specialist Appt within 3-5  Days Complete  HRI or [place] Complete  Social Work Consult for [place] Planning/Counseling [place] Screening Not Applicable  Medication Review Press photographer) Complete  Some recent data might be hidden

## 2020-10-08 NOTE — Progress Notes (Signed)
Patient given discharge instructions, medication list and follow up appointments given. Patient verbalized understanding. IV and tele were dcd. Will discharge home as ordered. Transported to exit via wheel chair and nursing staff. Patient with equipment for home.Nelda Bucks, Bettina Gavia RN

## 2020-10-08 NOTE — Progress Notes (Addendum)
Progress Note    10/08/2020 7:34 AM 5 Days Post-Op  Subjective:  No complaints. States her right leg pain is better. Able to tolerate ambulating a little yesterday   Vitals:   10/08/20 0410 10/08/20 0445  BP: (!) 162/68 (!) 151/64  Pulse: 76   Resp: 18 15  Temp:    SpO2: 100%    Physical Exam: Cardiac: regular rate and rhythm Lungs: non labored Incisions: right groin with Prevena with good seal, right medial leg incision clean, dry and intact Extremities: 2+ femoral pulses, palpable right DP. Brisk doppler DP/ PT Neurologic: alert and oriented  CBC    Component Value Date/Time   WBC 123.3 (HH) 10/04/2020 0114   RBC 3.42 (L) 10/04/2020 0114   HGB 8.9 (L) 10/04/2020 0114   HGB 11.4 (L) 06/28/2019 1146   HGB 11.0 (L) 03/09/2019 1024   HGB 10.8 (L) 12/13/2017 1217   HCT 31.5 (L) 10/04/2020 0114   HCT 35.5 03/09/2019 1024   HCT 34.5 (L) 12/13/2017 1217   PLT 195 10/04/2020 0114   PLT 209 06/28/2019 1146   PLT 247 03/09/2019 1024   MCV 92.1 10/04/2020 0114   MCV 86 03/09/2019 1024   MCV 88.9 12/13/2017 1217   MCH 26.0 10/04/2020 0114   MCHC 28.3 (L) 10/04/2020 0114   RDW 20.4 (H) 10/04/2020 0114   RDW 16.0 (H) 03/09/2019 1024   RDW 16.5 (H) 12/13/2017 1217   LYMPHSABS 121.7 (H) 09/24/2020 0847   LYMPHSABS 44.2 (H) 12/13/2017 1217   MONOABS 33.4 (H) 09/24/2020 0847   MONOABS 2.3 (H) 12/13/2017 1217   EOSABS 0.3 09/24/2020 0847   EOSABS 0.1 12/13/2017 1217   BASOSABS 0.3 (H) 09/24/2020 0847   BASOSABS 0.4 (H) 12/13/2017 1217    BMET    Component Value Date/Time   NA 139 10/08/2020 0324   NA 140 06/16/2020 1010   NA 142 12/13/2017 1217   K 4.4 10/08/2020 0324   K 4.5 12/13/2017 1217   CL 105 10/08/2020 0324   CO2 27 10/08/2020 0324   CO2 30 (H) 12/13/2017 1217   GLUCOSE 125 (H) 10/08/2020 0324   GLUCOSE 98 12/13/2017 1217   BUN 12 10/08/2020 0324   BUN 16 06/16/2020 1010   BUN 10.2 12/13/2017 1217   CREATININE 1.37 (H) 10/08/2020 0324   CREATININE  1.62 (H) 08/05/2020 1324   CREATININE 1.3 (H) 12/13/2017 1217   CALCIUM 8.4 (L) 10/08/2020 0324   CALCIUM 8.9 12/13/2017 1217   GFRNONAA 38 (L) 10/08/2020 0324   GFRNONAA 31 (L) 08/05/2020 1324   GFRNONAA 45 (L) 06/04/2016 1634   GFRAA 34 (L) 09/24/2020 0847   GFRAA 36 (L) 08/05/2020 1324   GFRAA 52 (L) 06/04/2016 1634    INR    Component Value Date/Time   INR 0.9 09/24/2020 0847     Intake/Output Summary (Last 24 hours) at 10/08/2020 0734 Last data filed at 10/07/2020 2000 Gross per 24 hour  Intake 2519.81 ml  Output 500 ml  Net 2019.81 ml     Assessment/Plan:  74 y.o. female is s/p  #1: Right femoral to below-knee popliteal artery bypass graft with 6 mm external ring PTFE #2: Right femoral endarterectomy extending into the profundofemoral artery with vein patch angioplasty #3: Right femoral Prevena wound   5 Days Post-Op. Right lower extremity well perfused and warm. Brisk doppler signals in right foot. Incisions clean dry and intact. Prevena wound vac to right groin. Encourage continued ambulation. Now refusing SNF so wants to go home  with HH. PT/ OT recommending home HHPT/OT. DME orders placed. Possible discharge later today   DVT prophylaxis: sq Heparin   [name] Caldwell, PA-C [facility] [phone] 10/08/2020 7:34 AM   I agree with the above.  Anticipate d/c home today. Will d/c with ASA and Plavix  [name] [name]

## 2020-10-08 NOTE — Discharge Summary (Signed)
Bypass Discharge Summary Patient ID: [name] [name] [id number] 74 y.o. Aug 05, 1946  Admit date: 10/03/2020  Discharge date and time: 10/08/2020  4:24 PM   Admitting Physician: Serafina Mitchell, MD   Discharge Physician: Serafina Mitchell, MD  Admission Diagnoses: Peripheral arterial disease [facility]) [I73.9]  Discharge Diagnoses: Peripheral arterial disease  Admission Condition: fair  Discharged Condition: good  Indication for Admission: peripheral arterial disease of right lower extremity with rest pain  Hospital Course: Patient was admitted on 10/03/20 and taken to the operating room. She underwent a right femoral to below knee popliteal artery bypass graft with 6 mm external ring PTFE. Right femoral endarterectomy extending into the profundafemoral artery with vein patch angioplasty and application of a right femoral Prevena wound VAC at the end of the case. She tolerated the procedure well and was transferred to the PACU in stable condition  Patient remained stable in the recovery room with patent right lower extremity bypass and was transferred to the floor  POD#1 she did well overnight. Right lower extremity well perfused with brisk doppler signals DP/PT. Prevena VAC with good seal. Hemodynamically stable. Encouraged her to get out of bed and mobilize. Pain control was major goal for her first day post op. PT/ OT evaluated wit recommendation for SNF  POD#2 with right lower extremity pain. Right lower extremity remained patent with doppler signals. Continue to encourage mobilization. Voiding without difficulty. Tolerating diet  POD#3 continued to have post operative pain in right leg. Right leg remained well perfused and warm with doppler signals intact.  The remainder of her hospital course consisted of adequate pain control, increasing mobility and tolerance of diet.  She decided to return home with home health services instead of SNF. This was arranged for her as well as needed  DME  POD#5 she remained stable for discharge. Tolerating ambulation more. Pain better controlled. Right lower extremity bypass patent with doppler PT/ Dp signals. Prevena wound VAC to right groin which she will go home. Instructed to continue her home medications including Plavix. PDMP reviewed and patient was sent pain medication to her pharmacy for post operative pain management. She will follow up with Dr. Trula Slade in 4-6 weeks with non invasive vascular lab studies  Consults: None  Treatments: surgery: #1: Right femoral to below-knee popliteal artery bypass graft with 6 mm external ring PTFE                         #2: Right femoral endarterectomy extending into the profundofemoral artery with vein patch angioplasty                         #3: Right femoral Prevena wound VAC   Disposition: Discharge disposition: 01-Home or Self Care       - For [facility] Registry use ---  Post-op:  Wound infection: No  Graft infection: No  Transfusion: No  If yes, 0 units given New Arrhythmia: No Patency judged by: '[ ]'  Dopper only, '[ ]'  Palpable graft pulse, '[ ]'  Palpable distal pulse, '[ ]'  ABI inc. > 0.15, '[ ]'  Duplex D/C Ambulatory Status: Ambulatory with Assistance  Complications: MI: [ X] No, '[ ]'  Troponin only, '[ ]'  EKG or Clinical CHF: No Resp failure: Valu.Nieves ] none, '[ ]'  Pneumonia, '[ ]'  Ventilator Chg in renal function: [ X] none, '[ ]'  Inc. Cr > 0.5, '[ ]'  Temp. Dialysis, '[ ]'  Permanent dialysis Stroke: Valu.Nieves ] None, '[ ]'   Minor, '[ ]'  Major Return to OR: No  Reason for return to OR: '[ ]'  Bleeding, '[ ]'  Infection, '[ ]'  Thrombosis, '[ ]'  Revision  Discharge medications: Statin use:  Yes ASA use:  No  for medical reason does not tolerate due to GI upset Plavix use:  Yes Beta blocker use: No  for medical reason not indicated Coumadin use: No  for medical reason not indicated    Patient Instructions:  Allergies as of 10/08/2020      Reactions   No [place] Food Allergies Diarrhea, Nausea And Vomiting    Cabbage, stomach pain    Aspirin Other (See Comments)   irritates stomach   Baclofen Other (See Comments)   Stomach irritation      Medication List    STOP taking these medications   diclofenac sodium 1 % Gel Commonly known as: VOLTAREN     TAKE these medications   acetaminophen 500 MG tablet Commonly known as: TYLENOL Take 500-1,000 mg by mouth every 6 (six) hours as needed for moderate pain.   albuterol 0.63 MG/3ML nebulizer solution Commonly known as: ACCUNEB Inhale 1 vial via nebulizer every 6 hours for wheezing as needed What changed: See the new instructions.   albuterol 108 (90 Base) MCG/ACT inhaler Commonly known as: VENTOLIN HFA Inhale 2 puffs by mouth every 6 hours as needed for wheezing or shortness of breath What changed: See the new instructions.   atorvastatin 80 MG tablet Commonly known as: LIPITOR Take 1 tablet (80 mg total) by mouth daily.   Breo Ellipta 100-25 MCG/INH Aepb Generic drug: fluticasone furoate-vilanterol Inhale 1 puff by mouth every day What changed: See the new instructions.   clopidogrel 75 MG tablet Commonly known as: PLAVIX Take 1 tablet by mouth every day   cyclobenzaprine 10 MG tablet Commonly known as: FLEXERIL Take 1 tablet by mouth 3 times a day as needed for muscle spasm What changed:   how much to take  how to take this  when to take this  reasons to take this  additional instructions   DULoxetine 60 MG capsule Commonly known as: Cymbalta Take 1 capsule (60 mg total) by mouth daily.   empagliflozin 10 MG Tabs tablet Commonly known as: Jardiance Take 1 tablet (10 mg total) by mouth daily.   ergocalciferol 1.25 MG (50000 UT) capsule Commonly known as: VITAMIN D2 Take 50,000 Units by mouth every Monday. Notes to patient: Take as you were prior to admission    famotidine 20 MG tablet Commonly known as: PEPCID Take 2 tablets by mouth every day   furosemide 20 MG tablet Commonly known as: LASIX Take 1  tablet (20 mg total) by mouth daily.   gabapentin 300 MG capsule Commonly known as: Neurontin Take 1 capsule (300 mg total) by mouth 3 (three) times daily. X 1 week, then 600 mg BID- long term 600 mg 2x/day-  for nerve pain What changed: additional instructions   glucose blood test strip Commonly known as: OneTouch Verio Use to check blood sugar once a day   hydrALAZINE 50 MG tablet Commonly known as: APRESOLINE Take 50 mg by mouth 3 (three) times daily.   lidocaine 5 % ointment Commonly known as: XYLOCAINE Apply 1 application topically as needed. Apply to R>L feet when burning up to 4x/day as needed   metFORMIN 500 MG tablet Commonly known as: GLUCOPHAGE Take 1 tablet (500 mg total) by mouth daily with breakfast. What changed: when to take this   montelukast 10 MG  tablet Commonly known as: SINGULAIR Take 1 tablet by mouth at bedtime   nitroGLYCERIN 0.4 MG SL tablet Commonly known as: NITROSTAT Dissolve 1 tablet under tongue every 5 minutes, up to 3 doses for chest pain What changed: See the new instructions.   onetouch ultrasoft lancets Use to check blood sugar once a day   OneTouch Verio w/Device Kit 1 Device by Does not apply route daily.   traMADol 50 MG tablet Commonly known as: ULTRAM Take 1 tablet (50 mg total) by mouth 2 (two) times daily as needed for moderate pain or severe pain.      Activity: activity as tolerated and no driving while on analgesics Diet: heart healthy Wound Care: keep wound clean and dry and follow instructions given for Prevena care  Follow-up with Dr. Trula Slade in 4-6 weeks.  Signed: [name] 10/09/2020 9:20 AM

## 2020-10-08 NOTE — Progress Notes (Signed)
Mobility Specialist - Progress Note   10/08/20 1406  Mobility  Activity Ambulated in hall  Level of Assistance Modified independent, requires aide device or extra time  Assistive Device Front wheel walker  Distance Ambulated (ft) 260 ft  Mobility Response Tolerated well  Mobility performed by Mobility specialist  $Mobility charge 1 Mobility    Pre-mobility: 83 HR During mobility: 92 HR Post-mobility: 80 HR  Pt asx during ambulation. Back in chair after walk, family member in room.   Pricilla Handler Mobility Specialist Mobility Specialist Phone: [phone]

## 2020-10-08 NOTE — Care Management Important Message (Signed)
Important Message  Patient Details  Name: [name] [name] MRN: [id number] Date of Birth: [birth year]   Medicare Important Message Given:  Yes     Shelda Altes 10/08/2020, 10:10 AM

## 2020-10-08 NOTE — Discharge Instructions (Signed)
 [facility] [place]  Discharge instructions  Lower Extremity Bypass Surgery  Please refer to the following instruction for your post-procedure care. Your surgeon or physician assistant will discuss any changes with you.  Activity  You are encouraged to walk as much as you can. You can slowly return to normal activities during the month after your surgery. Avoid strenuous activity and heavy lifting until your doctor tells you it's OK. Avoid activities such as vacuuming or swinging a golf club. Do not drive until your doctor give the OK and you are no longer taking prescription pain medications. It is also normal to have difficulty with sleep habits, eating and bowel movement after surgery. These will go away with time.  Bathing/Showering  Shower daily after you go home. Do not soak in a bathtub, hot tub, or swim until the incision heals completely.  Incision Care  Clean your incision with mild soap and water. Shower every day. Pat the area dry with a clean towel. You do not need a bandage unless otherwise instructed. Do not apply any ointments or creams to your incision. If you have open wounds you will be instructed how to care for them or a visiting nurse may be arranged for you. If you have staples or sutures along your incision they will be removed at your post-op appointment. You may have skin glue on your incision. Do not peel it off. It will come off on its own in about one week.  Keep Pravena wound vac on your groin incision until it loses it seal in about 7-10 days.  Once that happens, you can remove and then wash the groin wound with soap and water daily and pat dry. (No tub bath-only shower)  Then put a dry gauze or washcloth in the groin to keep this area dry to help prevent wound infection.  Do this daily and as needed.  Do not use Vaseline or neosporin on your incisions.  Only use soap and water on your incisions and then protect and keep  dry.   Diet  Resume your normal diet. There are no special food restrictions following this procedure. A low fat/ low cholesterol diet is recommended for all patients with vascular disease. In order to heal from your surgery, it is CRITICAL to get adequate nutrition. Your body requires vitamins, minerals, and protein. Vegetables are the best source of vitamins and minerals. Vegetables also provide the perfect balance of protein. Processed food has little nutritional value, so try to avoid this.  Medications  Resume taking all your medications unless your doctor or physician assistant tells you not to. If your incision is causing pain, you may take over-the-counter pain relievers such as acetaminophen (Tylenol). If you were prescribed a stronger pain medication, please aware these medication can cause nausea and constipation. Prevent nausea by taking the medication with a snack or meal. Avoid constipation by drinking plenty of fluids and eating foods with high amount of fiber, such as fruits, vegetables, and grains. Take Colace 100 mg (an over-the-counter stool softener) twice a day as needed for constipation.  Do not take Tylenol if you are taking prescription pain medications.  Follow Up  Our office will schedule a follow up appointment 2-3 weeks following discharge.  Please call us immediately for any of the following conditions  Severe or worsening pain in your legs or feet while at rest or while walking Increase pain, redness, warmth, or drainage (pus) from your incision site(s) Fever of 101   degree or higher The swelling in your leg with the bypass suddenly worsens and becomes more painful than when you were in the hospital If you have been instructed to feel your graft pulse then you should do so every day. If you can no longer feel this pulse, call the office immediately. Not all patients are given this instruction.  Leg swelling is common after leg bypass surgery.  The swelling should  improve over a few months following surgery. To improve the swelling, you may elevate your legs above the level of your heart while you are sitting or resting. Your surgeon or physician assistant may ask you to apply an ACE wrap or wear compression (TED) stockings to help to reduce swelling.  Reduce your risk of vascular disease  Stop smoking. If you would like help call [facility] at 1-800-QUIT-NOW (1-800-784-8669) or [place] at 336-586-4000.  Manage your cholesterol Maintain a desired weight Control your diabetes weight Control your diabetes Keep your blood pressure down  If you have any questions, please call the office at [phone]  

## 2020-10-08 NOTE — Progress Notes (Signed)
Physical Therapy Treatment Patient Details Name: [name] [name] MRN: [id number] DOB: [birth year] Today's Date: 10/08/2020    History of Present Illness The pt is a 74 yo female presenting s/p R femoral - popliteal artery bypass graft 10/9 due to progressive pain in her RLE. PMH includes: sucessful recanalization and stenting of L supeficial femoral arter in 2018, HLD, HTN, COPD, tobacco use, and DM II.      PT Comments    Pt supine in bed on arrival this session.  Focused on gt, transfers and re-trialing stair training to prepare for return home.  Pt is moving well.    Follow Up Recommendations  Home health PT     Equipment Recommendations  Rolling walker with 5" wheels;3in1 (PT)    Recommendations for Other Services       Precautions / Restrictions Precautions Precautions: Fall Precaution Comments: VAC Restrictions Weight Bearing Restrictions: No    Mobility  Bed Mobility Overal bed mobility: Needs Assistance Bed Mobility: Supine to Sit     Supine to sit: Supervision     General bed mobility comments: for safety  Transfers Overall transfer level: Needs assistance Equipment used: Rolling walker (2 wheeled) Transfers: Sit to/from Stand Sit to Stand: Supervision         General transfer comment: Cues for hand placement and forward advancement of RLE forward to reduce pain.  Ambulation/Gait Ambulation/Gait assistance: Supervision Gait Distance (Feet): 200 Feet Assistive device: Rolling walker (2 wheeled) Gait Pattern/deviations: Decreased stride length;Trunk flexed;Step-through pattern     General Gait Details: Pt required cues for posture and safety.   Stairs Stairs: Yes Stairs assistance: Min assist Stair Management: No rails (HHA) Number of Stairs: 4 General stair comments: HHA with cues for sequencing.   Wheelchair Mobility    Modified Rankin (Stroke Patients Only)       Balance Overall balance assessment: Needs  assistance Sitting-balance support: Bilateral upper extremity supported;Feet supported Sitting balance-Leahy Scale: Normal       Standing balance-Leahy Scale: Good                              Cognition Arousal/Alertness: Awake/alert Behavior During Therapy: WFL for tasks assessed/performed Overall Cognitive Status: Within Functional Limits for tasks assessed                                        Exercises      General Comments        Pertinent Vitals/Pain Pain Assessment: 0-10 Pain Score: 3  Pain Location: Right leg Pain Descriptors / Indicators: Aching Pain Intervention(s): Monitored during session;Repositioned    Home Living                      Prior Function            PT Goals (current goals can now be found in the care plan section) Acute Rehab PT Goals Patient Stated Goal: to go home Potential to Achieve Goals: Good Progress towards PT goals: Progressing toward goals    Frequency    Min 3X/week      PT Plan Current plan remains appropriate    Co-evaluation              AM-PAC PT "6 Clicks" Mobility   Outcome Measure  Help needed turning from your back to your side while  in a flat bed without using bedrails?: A Little Help needed moving from lying on your back to sitting on the side of a flat bed without using bedrails?: A Little Help needed moving to and from a bed to a chair (including a wheelchair)?: A Little Help needed standing up from a chair using your arms (e.g., wheelchair or bedside chair)?: A Little Help needed to walk in hospital room?: A Little Help needed climbing 3-5 steps with a railing? : A Lot 6 Click Score: 17    End of Session Equipment Utilized During Treatment: Gait belt Activity Tolerance: Patient tolerated treatment well Patient left: with call bell/phone within reach;in bed;with bed alarm set Nurse Communication: Mobility status PT Visit Diagnosis: Muscle weakness  (generalized) (M62.81);Difficulty in walking, not elsewhere classified (R26.2);Pain Pain - Right/Left: Right Pain - part of body: Leg     Time: 1025-8527 PT Time Calculation (min) (ACUTE ONLY): 10 min  Charges:  $Gait Training: 8-22 mins                     Erasmo Leventhal , PTA Acute Rehabilitation Services Pager [phone] Office [phone]     Ambriel Gorelick Eli Hose 10/08/2020, 11:58 AM

## 2020-10-09 DIAGNOSIS — N1832 Chronic kidney disease, stage 3b: Secondary | ICD-10-CM | POA: Diagnosis not present

## 2020-10-09 DIAGNOSIS — E1122 Type 2 diabetes mellitus with diabetic chronic kidney disease: Secondary | ICD-10-CM | POA: Diagnosis not present

## 2020-10-09 DIAGNOSIS — Z48812 Encounter for surgical aftercare following surgery on the circulatory system: Secondary | ICD-10-CM | POA: Diagnosis not present

## 2020-10-09 DIAGNOSIS — J449 Chronic obstructive pulmonary disease, unspecified: Secondary | ICD-10-CM | POA: Diagnosis not present

## 2020-10-09 DIAGNOSIS — Z95820 Peripheral vascular angioplasty status with implants and grafts: Secondary | ICD-10-CM | POA: Diagnosis not present

## 2020-10-09 DIAGNOSIS — M545 Low back pain, unspecified: Secondary | ICD-10-CM | POA: Diagnosis not present

## 2020-10-09 DIAGNOSIS — I5030 Unspecified diastolic (congestive) heart failure: Secondary | ICD-10-CM | POA: Diagnosis not present

## 2020-10-09 DIAGNOSIS — I130 Hypertensive heart and chronic kidney disease with heart failure and stage 1 through stage 4 chronic kidney disease, or unspecified chronic kidney disease: Secondary | ICD-10-CM | POA: Diagnosis not present

## 2020-10-09 DIAGNOSIS — I70213 Atherosclerosis of native arteries of extremities with intermittent claudication, bilateral legs: Secondary | ICD-10-CM | POA: Diagnosis not present

## 2020-10-09 DIAGNOSIS — R2681 Unsteadiness on feet: Secondary | ICD-10-CM | POA: Diagnosis not present

## 2020-10-13 ENCOUNTER — Telehealth: Payer: Self-pay

## 2020-10-13 DIAGNOSIS — R2681 Unsteadiness on feet: Secondary | ICD-10-CM | POA: Diagnosis not present

## 2020-10-13 DIAGNOSIS — J449 Chronic obstructive pulmonary disease, unspecified: Secondary | ICD-10-CM | POA: Diagnosis not present

## 2020-10-13 DIAGNOSIS — I5030 Unspecified diastolic (congestive) heart failure: Secondary | ICD-10-CM | POA: Diagnosis not present

## 2020-10-13 DIAGNOSIS — N1832 Chronic kidney disease, stage 3b: Secondary | ICD-10-CM | POA: Diagnosis not present

## 2020-10-13 DIAGNOSIS — M545 Low back pain, unspecified: Secondary | ICD-10-CM | POA: Diagnosis not present

## 2020-10-13 DIAGNOSIS — Z95820 Peripheral vascular angioplasty status with implants and grafts: Secondary | ICD-10-CM | POA: Diagnosis not present

## 2020-10-13 DIAGNOSIS — E1122 Type 2 diabetes mellitus with diabetic chronic kidney disease: Secondary | ICD-10-CM | POA: Diagnosis not present

## 2020-10-13 DIAGNOSIS — I130 Hypertensive heart and chronic kidney disease with heart failure and stage 1 through stage 4 chronic kidney disease, or unspecified chronic kidney disease: Secondary | ICD-10-CM | POA: Diagnosis not present

## 2020-10-13 DIAGNOSIS — Z48812 Encounter for surgical aftercare following surgery on the circulatory system: Secondary | ICD-10-CM | POA: Diagnosis not present

## 2020-10-13 DIAGNOSIS — I70213 Atherosclerosis of native arteries of extremities with intermittent claudication, bilateral legs: Secondary | ICD-10-CM | POA: Diagnosis not present

## 2020-10-13 NOTE — Telephone Encounter (Signed)
Bessie from PT [facility] called to report wound vac was off, and there was a scant about of odorless yellow drainage and some pain and swelling in the R leg. Swelling is not new. Patient has pain medication. Instructed to keep site clean, dry and covered with a clean gauze. Change daily and if site shows s/s of infection to call us back.

## 2020-10-14 ENCOUNTER — Telehealth: Payer: Self-pay

## 2020-10-14 ENCOUNTER — Telehealth: Payer: Medicare Other

## 2020-10-14 DIAGNOSIS — Z95820 Peripheral vascular angioplasty status with implants and grafts: Secondary | ICD-10-CM | POA: Diagnosis not present

## 2020-10-14 DIAGNOSIS — Z48812 Encounter for surgical aftercare following surgery on the circulatory system: Secondary | ICD-10-CM | POA: Diagnosis not present

## 2020-10-14 DIAGNOSIS — I130 Hypertensive heart and chronic kidney disease with heart failure and stage 1 through stage 4 chronic kidney disease, or unspecified chronic kidney disease: Secondary | ICD-10-CM | POA: Diagnosis not present

## 2020-10-14 DIAGNOSIS — I5030 Unspecified diastolic (congestive) heart failure: Secondary | ICD-10-CM | POA: Diagnosis not present

## 2020-10-14 DIAGNOSIS — M545 Low back pain, unspecified: Secondary | ICD-10-CM | POA: Diagnosis not present

## 2020-10-14 DIAGNOSIS — R2681 Unsteadiness on feet: Secondary | ICD-10-CM | POA: Diagnosis not present

## 2020-10-14 DIAGNOSIS — N1832 Chronic kidney disease, stage 3b: Secondary | ICD-10-CM | POA: Diagnosis not present

## 2020-10-14 DIAGNOSIS — J449 Chronic obstructive pulmonary disease, unspecified: Secondary | ICD-10-CM | POA: Diagnosis not present

## 2020-10-14 DIAGNOSIS — E1122 Type 2 diabetes mellitus with diabetic chronic kidney disease: Secondary | ICD-10-CM | POA: Diagnosis not present

## 2020-10-14 DIAGNOSIS — I70213 Atherosclerosis of native arteries of extremities with intermittent claudication, bilateral legs: Secondary | ICD-10-CM | POA: Diagnosis not present

## 2020-10-14 NOTE — Telephone Encounter (Signed)
  Care Management   Outreach Note  10/14/2020 Name: [name] [name] MRN: [id number] DOB: [birth year]  Referred by: Danna Hefty, DO Reason for referral : Appointment (EMMI RED)   An unsuccessful telephone outreach was attempted today. The patient was referred to the case management team for assistance with care management and care coordination.   Follow Up Plan: A HIPAA compliant phone message was left for the patient providing contact information and requesting a return call.  The care management team will reach out to the patient again over the next 1-3 days.   Lazaro Arms RN, BSN, [facility] Care Management Coordinator [place] Phone: [phone] Fax: [phone]

## 2020-10-15 ENCOUNTER — Ambulatory Visit: Payer: Medicare Other

## 2020-10-15 NOTE — Chronic Care Management (AMB) (Signed)
  Chronic Care Management   Note  10/15/2020 Name: CHABELY NORBY MRN: [id number] DOB: 1946/10/27  EMMI- Red on EMMI Alert: Day # 4 Date: 10/13/20 Red Alert Reason: Know who to call about changes in conditions? No   Outreach attempt # 2 to patient HIPAA verified by the patient. Explained to the patient the nature of the call Patient condition was assessed. The patient stated that she did know who to call concerning her condition.  She was told to call [facility].   . Reviewed medications with patient and discussed  . Discussed her home health- [facility].  They were at the home yesterday and she states scheduled to come today. Told to watch for signs and symptoms of infection. Patient was advised to keep the site clean and dry with a clean dressing. . Patient will follow up with Dr Trula Slade in 4-6 weeks and they will call her with an appointment.    Explained if AMRIE GURGANUS receives an automated call again and the  response to any of the questions are abnormal TRACIA LACOMB may get another call. Patient states that she appreciated the call.  Plan: No further follow Needed  Lazaro Arms RN, BSN, [facility] Care Management Coordinator [place] Phone: [phone] Fax: [phone]

## 2020-10-16 DIAGNOSIS — Z48812 Encounter for surgical aftercare following surgery on the circulatory system: Secondary | ICD-10-CM | POA: Diagnosis not present

## 2020-10-16 DIAGNOSIS — E1122 Type 2 diabetes mellitus with diabetic chronic kidney disease: Secondary | ICD-10-CM | POA: Diagnosis not present

## 2020-10-16 DIAGNOSIS — J449 Chronic obstructive pulmonary disease, unspecified: Secondary | ICD-10-CM | POA: Diagnosis not present

## 2020-10-16 DIAGNOSIS — I70213 Atherosclerosis of native arteries of extremities with intermittent claudication, bilateral legs: Secondary | ICD-10-CM | POA: Diagnosis not present

## 2020-10-16 DIAGNOSIS — I130 Hypertensive heart and chronic kidney disease with heart failure and stage 1 through stage 4 chronic kidney disease, or unspecified chronic kidney disease: Secondary | ICD-10-CM | POA: Diagnosis not present

## 2020-10-16 DIAGNOSIS — M545 Low back pain, unspecified: Secondary | ICD-10-CM | POA: Diagnosis not present

## 2020-10-16 DIAGNOSIS — Z95820 Peripheral vascular angioplasty status with implants and grafts: Secondary | ICD-10-CM | POA: Diagnosis not present

## 2020-10-16 DIAGNOSIS — R2681 Unsteadiness on feet: Secondary | ICD-10-CM | POA: Diagnosis not present

## 2020-10-16 DIAGNOSIS — N1832 Chronic kidney disease, stage 3b: Secondary | ICD-10-CM | POA: Diagnosis not present

## 2020-10-16 DIAGNOSIS — I5030 Unspecified diastolic (congestive) heart failure: Secondary | ICD-10-CM | POA: Diagnosis not present

## 2020-10-17 ENCOUNTER — Telehealth: Payer: Self-pay | Admitting: *Deleted

## 2020-10-17 DIAGNOSIS — I130 Hypertensive heart and chronic kidney disease with heart failure and stage 1 through stage 4 chronic kidney disease, or unspecified chronic kidney disease: Secondary | ICD-10-CM | POA: Diagnosis not present

## 2020-10-17 DIAGNOSIS — E1122 Type 2 diabetes mellitus with diabetic chronic kidney disease: Secondary | ICD-10-CM | POA: Diagnosis not present

## 2020-10-17 DIAGNOSIS — N1832 Chronic kidney disease, stage 3b: Secondary | ICD-10-CM | POA: Diagnosis not present

## 2020-10-17 DIAGNOSIS — R2681 Unsteadiness on feet: Secondary | ICD-10-CM | POA: Diagnosis not present

## 2020-10-17 DIAGNOSIS — M545 Low back pain, unspecified: Secondary | ICD-10-CM | POA: Diagnosis not present

## 2020-10-17 DIAGNOSIS — I5030 Unspecified diastolic (congestive) heart failure: Secondary | ICD-10-CM | POA: Diagnosis not present

## 2020-10-17 DIAGNOSIS — J449 Chronic obstructive pulmonary disease, unspecified: Secondary | ICD-10-CM | POA: Diagnosis not present

## 2020-10-17 DIAGNOSIS — Z48812 Encounter for surgical aftercare following surgery on the circulatory system: Secondary | ICD-10-CM | POA: Diagnosis not present

## 2020-10-17 DIAGNOSIS — Z95820 Peripheral vascular angioplasty status with implants and grafts: Secondary | ICD-10-CM | POA: Diagnosis not present

## 2020-10-17 DIAGNOSIS — I70213 Atherosclerosis of native arteries of extremities with intermittent claudication, bilateral legs: Secondary | ICD-10-CM | POA: Diagnosis not present

## 2020-10-17 NOTE — Telephone Encounter (Signed)
[name] [name], [facility] nurse called stating patient has +3 edema of right leg and small opening at groin.  The leg has slight warmth to touch but denies fever or drainage.  Vitals are stable per [name] [name]. Instructed nurse to remind patient to keep her leg elevated and to call if any worsening symptoms occur.

## 2020-10-20 DIAGNOSIS — I130 Hypertensive heart and chronic kidney disease with heart failure and stage 1 through stage 4 chronic kidney disease, or unspecified chronic kidney disease: Secondary | ICD-10-CM | POA: Diagnosis not present

## 2020-10-20 DIAGNOSIS — J449 Chronic obstructive pulmonary disease, unspecified: Secondary | ICD-10-CM | POA: Diagnosis not present

## 2020-10-20 DIAGNOSIS — N1832 Chronic kidney disease, stage 3b: Secondary | ICD-10-CM | POA: Diagnosis not present

## 2020-10-20 DIAGNOSIS — I70213 Atherosclerosis of native arteries of extremities with intermittent claudication, bilateral legs: Secondary | ICD-10-CM | POA: Diagnosis not present

## 2020-10-20 DIAGNOSIS — Z95820 Peripheral vascular angioplasty status with implants and grafts: Secondary | ICD-10-CM | POA: Diagnosis not present

## 2020-10-20 DIAGNOSIS — M545 Low back pain, unspecified: Secondary | ICD-10-CM | POA: Diagnosis not present

## 2020-10-20 DIAGNOSIS — Z48812 Encounter for surgical aftercare following surgery on the circulatory system: Secondary | ICD-10-CM | POA: Diagnosis not present

## 2020-10-20 DIAGNOSIS — R2681 Unsteadiness on feet: Secondary | ICD-10-CM | POA: Diagnosis not present

## 2020-10-20 DIAGNOSIS — E1122 Type 2 diabetes mellitus with diabetic chronic kidney disease: Secondary | ICD-10-CM | POA: Diagnosis not present

## 2020-10-20 DIAGNOSIS — I5030 Unspecified diastolic (congestive) heart failure: Secondary | ICD-10-CM | POA: Diagnosis not present

## 2020-10-21 DIAGNOSIS — Z48812 Encounter for surgical aftercare following surgery on the circulatory system: Secondary | ICD-10-CM | POA: Diagnosis not present

## 2020-10-21 DIAGNOSIS — I5030 Unspecified diastolic (congestive) heart failure: Secondary | ICD-10-CM | POA: Diagnosis not present

## 2020-10-21 DIAGNOSIS — I130 Hypertensive heart and chronic kidney disease with heart failure and stage 1 through stage 4 chronic kidney disease, or unspecified chronic kidney disease: Secondary | ICD-10-CM | POA: Diagnosis not present

## 2020-10-21 DIAGNOSIS — Z95820 Peripheral vascular angioplasty status with implants and grafts: Secondary | ICD-10-CM | POA: Diagnosis not present

## 2020-10-21 DIAGNOSIS — I70213 Atherosclerosis of native arteries of extremities with intermittent claudication, bilateral legs: Secondary | ICD-10-CM | POA: Diagnosis not present

## 2020-10-21 DIAGNOSIS — M545 Low back pain, unspecified: Secondary | ICD-10-CM | POA: Diagnosis not present

## 2020-10-21 DIAGNOSIS — N1832 Chronic kidney disease, stage 3b: Secondary | ICD-10-CM | POA: Diagnosis not present

## 2020-10-21 DIAGNOSIS — E1122 Type 2 diabetes mellitus with diabetic chronic kidney disease: Secondary | ICD-10-CM | POA: Diagnosis not present

## 2020-10-21 DIAGNOSIS — J449 Chronic obstructive pulmonary disease, unspecified: Secondary | ICD-10-CM | POA: Diagnosis not present

## 2020-10-21 DIAGNOSIS — R2681 Unsteadiness on feet: Secondary | ICD-10-CM | POA: Diagnosis not present

## 2020-10-22 DIAGNOSIS — I70213 Atherosclerosis of native arteries of extremities with intermittent claudication, bilateral legs: Secondary | ICD-10-CM | POA: Diagnosis not present

## 2020-10-22 DIAGNOSIS — N1832 Chronic kidney disease, stage 3b: Secondary | ICD-10-CM | POA: Diagnosis not present

## 2020-10-22 DIAGNOSIS — E1122 Type 2 diabetes mellitus with diabetic chronic kidney disease: Secondary | ICD-10-CM | POA: Diagnosis not present

## 2020-10-22 DIAGNOSIS — R2681 Unsteadiness on feet: Secondary | ICD-10-CM | POA: Diagnosis not present

## 2020-10-22 DIAGNOSIS — Z48812 Encounter for surgical aftercare following surgery on the circulatory system: Secondary | ICD-10-CM | POA: Diagnosis not present

## 2020-10-22 DIAGNOSIS — J449 Chronic obstructive pulmonary disease, unspecified: Secondary | ICD-10-CM | POA: Diagnosis not present

## 2020-10-22 DIAGNOSIS — M545 Low back pain, unspecified: Secondary | ICD-10-CM | POA: Diagnosis not present

## 2020-10-22 DIAGNOSIS — I5030 Unspecified diastolic (congestive) heart failure: Secondary | ICD-10-CM | POA: Diagnosis not present

## 2020-10-22 DIAGNOSIS — Z95820 Peripheral vascular angioplasty status with implants and grafts: Secondary | ICD-10-CM | POA: Diagnosis not present

## 2020-10-22 DIAGNOSIS — I130 Hypertensive heart and chronic kidney disease with heart failure and stage 1 through stage 4 chronic kidney disease, or unspecified chronic kidney disease: Secondary | ICD-10-CM | POA: Diagnosis not present

## 2020-10-24 ENCOUNTER — Telehealth: Payer: Self-pay | Admitting: *Deleted

## 2020-10-24 DIAGNOSIS — Z95820 Peripheral vascular angioplasty status with implants and grafts: Secondary | ICD-10-CM | POA: Diagnosis not present

## 2020-10-24 DIAGNOSIS — I70213 Atherosclerosis of native arteries of extremities with intermittent claudication, bilateral legs: Secondary | ICD-10-CM | POA: Diagnosis not present

## 2020-10-24 DIAGNOSIS — R2681 Unsteadiness on feet: Secondary | ICD-10-CM | POA: Diagnosis not present

## 2020-10-24 DIAGNOSIS — M545 Low back pain, unspecified: Secondary | ICD-10-CM | POA: Diagnosis not present

## 2020-10-24 DIAGNOSIS — I130 Hypertensive heart and chronic kidney disease with heart failure and stage 1 through stage 4 chronic kidney disease, or unspecified chronic kidney disease: Secondary | ICD-10-CM | POA: Diagnosis not present

## 2020-10-24 DIAGNOSIS — Z48812 Encounter for surgical aftercare following surgery on the circulatory system: Secondary | ICD-10-CM | POA: Diagnosis not present

## 2020-10-24 DIAGNOSIS — E1122 Type 2 diabetes mellitus with diabetic chronic kidney disease: Secondary | ICD-10-CM | POA: Diagnosis not present

## 2020-10-24 DIAGNOSIS — N1832 Chronic kidney disease, stage 3b: Secondary | ICD-10-CM | POA: Diagnosis not present

## 2020-10-24 DIAGNOSIS — J449 Chronic obstructive pulmonary disease, unspecified: Secondary | ICD-10-CM | POA: Diagnosis not present

## 2020-10-24 DIAGNOSIS — I5030 Unspecified diastolic (congestive) heart failure: Secondary | ICD-10-CM | POA: Diagnosis not present

## 2020-10-24 NOTE — Telephone Encounter (Signed)
Spoke with [name] [name] home Doctor, general practice. States pt is in bed immobilized complaining about 10/10 left rib pain. Discussed that we do not feel this is related to her fem-pop procedure and she should go to the ED. [name] [name] agrees and will call the patient and inform her.

## 2020-10-26 DIAGNOSIS — I130 Hypertensive heart and chronic kidney disease with heart failure and stage 1 through stage 4 chronic kidney disease, or unspecified chronic kidney disease: Secondary | ICD-10-CM | POA: Diagnosis not present

## 2020-10-26 DIAGNOSIS — I5030 Unspecified diastolic (congestive) heart failure: Secondary | ICD-10-CM | POA: Diagnosis not present

## 2020-10-26 DIAGNOSIS — J449 Chronic obstructive pulmonary disease, unspecified: Secondary | ICD-10-CM | POA: Diagnosis not present

## 2020-10-26 DIAGNOSIS — E1122 Type 2 diabetes mellitus with diabetic chronic kidney disease: Secondary | ICD-10-CM | POA: Diagnosis not present

## 2020-10-26 DIAGNOSIS — I70213 Atherosclerosis of native arteries of extremities with intermittent claudication, bilateral legs: Secondary | ICD-10-CM | POA: Diagnosis not present

## 2020-10-26 DIAGNOSIS — R2681 Unsteadiness on feet: Secondary | ICD-10-CM | POA: Diagnosis not present

## 2020-10-26 DIAGNOSIS — N1832 Chronic kidney disease, stage 3b: Secondary | ICD-10-CM | POA: Diagnosis not present

## 2020-10-26 DIAGNOSIS — Z48812 Encounter for surgical aftercare following surgery on the circulatory system: Secondary | ICD-10-CM | POA: Diagnosis not present

## 2020-10-26 DIAGNOSIS — Z95820 Peripheral vascular angioplasty status with implants and grafts: Secondary | ICD-10-CM | POA: Diagnosis not present

## 2020-10-26 DIAGNOSIS — M545 Low back pain, unspecified: Secondary | ICD-10-CM | POA: Diagnosis not present

## 2020-10-27 ENCOUNTER — Telehealth: Payer: Self-pay

## 2020-10-27 DIAGNOSIS — R2681 Unsteadiness on feet: Secondary | ICD-10-CM | POA: Diagnosis not present

## 2020-10-27 DIAGNOSIS — I130 Hypertensive heart and chronic kidney disease with heart failure and stage 1 through stage 4 chronic kidney disease, or unspecified chronic kidney disease: Secondary | ICD-10-CM | POA: Diagnosis not present

## 2020-10-27 DIAGNOSIS — N1832 Chronic kidney disease, stage 3b: Secondary | ICD-10-CM | POA: Diagnosis not present

## 2020-10-27 DIAGNOSIS — I70213 Atherosclerosis of native arteries of extremities with intermittent claudication, bilateral legs: Secondary | ICD-10-CM | POA: Diagnosis not present

## 2020-10-27 DIAGNOSIS — E1122 Type 2 diabetes mellitus with diabetic chronic kidney disease: Secondary | ICD-10-CM | POA: Diagnosis not present

## 2020-10-27 DIAGNOSIS — J449 Chronic obstructive pulmonary disease, unspecified: Secondary | ICD-10-CM | POA: Diagnosis not present

## 2020-10-27 DIAGNOSIS — Z48812 Encounter for surgical aftercare following surgery on the circulatory system: Secondary | ICD-10-CM | POA: Diagnosis not present

## 2020-10-27 DIAGNOSIS — M545 Low back pain, unspecified: Secondary | ICD-10-CM | POA: Diagnosis not present

## 2020-10-27 DIAGNOSIS — I5030 Unspecified diastolic (congestive) heart failure: Secondary | ICD-10-CM | POA: Diagnosis not present

## 2020-10-27 DIAGNOSIS — Z95820 Peripheral vascular angioplasty status with implants and grafts: Secondary | ICD-10-CM | POA: Diagnosis not present

## 2020-10-27 NOTE — Telephone Encounter (Signed)
Erica from [facility] called to report patient reports some drainage from lower leg incision. Danae Chen says that neither she or the LPN taking care of patient noticed any drainage, odor or redness. They will continue to monitor and call the office back if s/s of infection develop.

## 2020-10-29 DIAGNOSIS — R2681 Unsteadiness on feet: Secondary | ICD-10-CM | POA: Diagnosis not present

## 2020-10-29 DIAGNOSIS — E1122 Type 2 diabetes mellitus with diabetic chronic kidney disease: Secondary | ICD-10-CM | POA: Diagnosis not present

## 2020-10-29 DIAGNOSIS — I70213 Atherosclerosis of native arteries of extremities with intermittent claudication, bilateral legs: Secondary | ICD-10-CM | POA: Diagnosis not present

## 2020-10-29 DIAGNOSIS — Z95820 Peripheral vascular angioplasty status with implants and grafts: Secondary | ICD-10-CM | POA: Diagnosis not present

## 2020-10-29 DIAGNOSIS — M545 Low back pain, unspecified: Secondary | ICD-10-CM | POA: Diagnosis not present

## 2020-10-29 DIAGNOSIS — I130 Hypertensive heart and chronic kidney disease with heart failure and stage 1 through stage 4 chronic kidney disease, or unspecified chronic kidney disease: Secondary | ICD-10-CM | POA: Diagnosis not present

## 2020-10-29 DIAGNOSIS — N1832 Chronic kidney disease, stage 3b: Secondary | ICD-10-CM | POA: Diagnosis not present

## 2020-10-29 DIAGNOSIS — Z48812 Encounter for surgical aftercare following surgery on the circulatory system: Secondary | ICD-10-CM | POA: Diagnosis not present

## 2020-10-29 DIAGNOSIS — I5030 Unspecified diastolic (congestive) heart failure: Secondary | ICD-10-CM | POA: Diagnosis not present

## 2020-10-29 DIAGNOSIS — J449 Chronic obstructive pulmonary disease, unspecified: Secondary | ICD-10-CM | POA: Diagnosis not present

## 2020-11-03 DIAGNOSIS — I70213 Atherosclerosis of native arteries of extremities with intermittent claudication, bilateral legs: Secondary | ICD-10-CM | POA: Diagnosis not present

## 2020-11-03 DIAGNOSIS — J449 Chronic obstructive pulmonary disease, unspecified: Secondary | ICD-10-CM | POA: Diagnosis not present

## 2020-11-03 DIAGNOSIS — N1832 Chronic kidney disease, stage 3b: Secondary | ICD-10-CM | POA: Diagnosis not present

## 2020-11-03 DIAGNOSIS — Z95820 Peripheral vascular angioplasty status with implants and grafts: Secondary | ICD-10-CM | POA: Diagnosis not present

## 2020-11-03 DIAGNOSIS — I130 Hypertensive heart and chronic kidney disease with heart failure and stage 1 through stage 4 chronic kidney disease, or unspecified chronic kidney disease: Secondary | ICD-10-CM | POA: Diagnosis not present

## 2020-11-03 DIAGNOSIS — R2681 Unsteadiness on feet: Secondary | ICD-10-CM | POA: Diagnosis not present

## 2020-11-03 DIAGNOSIS — M545 Low back pain, unspecified: Secondary | ICD-10-CM | POA: Diagnosis not present

## 2020-11-03 DIAGNOSIS — E1122 Type 2 diabetes mellitus with diabetic chronic kidney disease: Secondary | ICD-10-CM | POA: Diagnosis not present

## 2020-11-03 DIAGNOSIS — I5030 Unspecified diastolic (congestive) heart failure: Secondary | ICD-10-CM | POA: Diagnosis not present

## 2020-11-03 DIAGNOSIS — Z48812 Encounter for surgical aftercare following surgery on the circulatory system: Secondary | ICD-10-CM | POA: Diagnosis not present

## 2020-11-04 DIAGNOSIS — I5030 Unspecified diastolic (congestive) heart failure: Secondary | ICD-10-CM | POA: Diagnosis not present

## 2020-11-04 DIAGNOSIS — R2681 Unsteadiness on feet: Secondary | ICD-10-CM | POA: Diagnosis not present

## 2020-11-04 DIAGNOSIS — Z48812 Encounter for surgical aftercare following surgery on the circulatory system: Secondary | ICD-10-CM | POA: Diagnosis not present

## 2020-11-04 DIAGNOSIS — N1832 Chronic kidney disease, stage 3b: Secondary | ICD-10-CM | POA: Diagnosis not present

## 2020-11-04 DIAGNOSIS — M545 Low back pain, unspecified: Secondary | ICD-10-CM | POA: Diagnosis not present

## 2020-11-04 DIAGNOSIS — J449 Chronic obstructive pulmonary disease, unspecified: Secondary | ICD-10-CM | POA: Diagnosis not present

## 2020-11-04 DIAGNOSIS — Z95820 Peripheral vascular angioplasty status with implants and grafts: Secondary | ICD-10-CM | POA: Diagnosis not present

## 2020-11-04 DIAGNOSIS — E1122 Type 2 diabetes mellitus with diabetic chronic kidney disease: Secondary | ICD-10-CM | POA: Diagnosis not present

## 2020-11-04 DIAGNOSIS — I70213 Atherosclerosis of native arteries of extremities with intermittent claudication, bilateral legs: Secondary | ICD-10-CM | POA: Diagnosis not present

## 2020-11-04 DIAGNOSIS — I130 Hypertensive heart and chronic kidney disease with heart failure and stage 1 through stage 4 chronic kidney disease, or unspecified chronic kidney disease: Secondary | ICD-10-CM | POA: Diagnosis not present

## 2020-11-05 DIAGNOSIS — Z95820 Peripheral vascular angioplasty status with implants and grafts: Secondary | ICD-10-CM | POA: Diagnosis not present

## 2020-11-05 DIAGNOSIS — R2681 Unsteadiness on feet: Secondary | ICD-10-CM | POA: Diagnosis not present

## 2020-11-05 DIAGNOSIS — I5030 Unspecified diastolic (congestive) heart failure: Secondary | ICD-10-CM | POA: Diagnosis not present

## 2020-11-05 DIAGNOSIS — J449 Chronic obstructive pulmonary disease, unspecified: Secondary | ICD-10-CM | POA: Diagnosis not present

## 2020-11-05 DIAGNOSIS — M545 Low back pain, unspecified: Secondary | ICD-10-CM | POA: Diagnosis not present

## 2020-11-05 DIAGNOSIS — E1122 Type 2 diabetes mellitus with diabetic chronic kidney disease: Secondary | ICD-10-CM | POA: Diagnosis not present

## 2020-11-05 DIAGNOSIS — I130 Hypertensive heart and chronic kidney disease with heart failure and stage 1 through stage 4 chronic kidney disease, or unspecified chronic kidney disease: Secondary | ICD-10-CM | POA: Diagnosis not present

## 2020-11-05 DIAGNOSIS — I70213 Atherosclerosis of native arteries of extremities with intermittent claudication, bilateral legs: Secondary | ICD-10-CM | POA: Diagnosis not present

## 2020-11-05 DIAGNOSIS — N1832 Chronic kidney disease, stage 3b: Secondary | ICD-10-CM | POA: Diagnosis not present

## 2020-11-05 DIAGNOSIS — Z48812 Encounter for surgical aftercare following surgery on the circulatory system: Secondary | ICD-10-CM | POA: Diagnosis not present

## 2020-11-10 DIAGNOSIS — I70213 Atherosclerosis of native arteries of extremities with intermittent claudication, bilateral legs: Secondary | ICD-10-CM | POA: Diagnosis not present

## 2020-11-10 DIAGNOSIS — N1832 Chronic kidney disease, stage 3b: Secondary | ICD-10-CM | POA: Diagnosis not present

## 2020-11-10 DIAGNOSIS — R2681 Unsteadiness on feet: Secondary | ICD-10-CM | POA: Diagnosis not present

## 2020-11-10 DIAGNOSIS — Z95820 Peripheral vascular angioplasty status with implants and grafts: Secondary | ICD-10-CM | POA: Diagnosis not present

## 2020-11-10 DIAGNOSIS — J449 Chronic obstructive pulmonary disease, unspecified: Secondary | ICD-10-CM | POA: Diagnosis not present

## 2020-11-10 DIAGNOSIS — I130 Hypertensive heart and chronic kidney disease with heart failure and stage 1 through stage 4 chronic kidney disease, or unspecified chronic kidney disease: Secondary | ICD-10-CM | POA: Diagnosis not present

## 2020-11-10 DIAGNOSIS — Z48812 Encounter for surgical aftercare following surgery on the circulatory system: Secondary | ICD-10-CM | POA: Diagnosis not present

## 2020-11-10 DIAGNOSIS — E1122 Type 2 diabetes mellitus with diabetic chronic kidney disease: Secondary | ICD-10-CM | POA: Diagnosis not present

## 2020-11-10 DIAGNOSIS — M545 Low back pain, unspecified: Secondary | ICD-10-CM | POA: Diagnosis not present

## 2020-11-10 DIAGNOSIS — I5030 Unspecified diastolic (congestive) heart failure: Secondary | ICD-10-CM | POA: Diagnosis not present

## 2020-11-17 ENCOUNTER — Telehealth: Payer: Self-pay

## 2020-11-17 DIAGNOSIS — N1832 Chronic kidney disease, stage 3b: Secondary | ICD-10-CM | POA: Diagnosis not present

## 2020-11-17 DIAGNOSIS — I130 Hypertensive heart and chronic kidney disease with heart failure and stage 1 through stage 4 chronic kidney disease, or unspecified chronic kidney disease: Secondary | ICD-10-CM | POA: Diagnosis not present

## 2020-11-17 DIAGNOSIS — M545 Low back pain, unspecified: Secondary | ICD-10-CM | POA: Diagnosis not present

## 2020-11-17 DIAGNOSIS — I70213 Atherosclerosis of native arteries of extremities with intermittent claudication, bilateral legs: Secondary | ICD-10-CM | POA: Diagnosis not present

## 2020-11-17 DIAGNOSIS — E1122 Type 2 diabetes mellitus with diabetic chronic kidney disease: Secondary | ICD-10-CM | POA: Diagnosis not present

## 2020-11-17 DIAGNOSIS — Z95820 Peripheral vascular angioplasty status with implants and grafts: Secondary | ICD-10-CM | POA: Diagnosis not present

## 2020-11-17 DIAGNOSIS — R2681 Unsteadiness on feet: Secondary | ICD-10-CM | POA: Diagnosis not present

## 2020-11-17 DIAGNOSIS — I5030 Unspecified diastolic (congestive) heart failure: Secondary | ICD-10-CM | POA: Diagnosis not present

## 2020-11-17 DIAGNOSIS — J449 Chronic obstructive pulmonary disease, unspecified: Secondary | ICD-10-CM | POA: Diagnosis not present

## 2020-11-17 DIAGNOSIS — Z48812 Encounter for surgical aftercare following surgery on the circulatory system: Secondary | ICD-10-CM | POA: Diagnosis not present

## 2020-11-17 NOTE — Telephone Encounter (Signed)
[name] [name] at [facility] a verbal order for PT 2x a week for 3 weeks

## 2020-11-19 ENCOUNTER — Other Ambulatory Visit: Payer: Self-pay | Admitting: Family Medicine

## 2020-11-21 DIAGNOSIS — Z48812 Encounter for surgical aftercare following surgery on the circulatory system: Secondary | ICD-10-CM | POA: Diagnosis not present

## 2020-11-21 DIAGNOSIS — I5030 Unspecified diastolic (congestive) heart failure: Secondary | ICD-10-CM | POA: Diagnosis not present

## 2020-11-21 DIAGNOSIS — M545 Low back pain, unspecified: Secondary | ICD-10-CM | POA: Diagnosis not present

## 2020-11-21 DIAGNOSIS — I70213 Atherosclerosis of native arteries of extremities with intermittent claudication, bilateral legs: Secondary | ICD-10-CM | POA: Diagnosis not present

## 2020-11-21 DIAGNOSIS — J449 Chronic obstructive pulmonary disease, unspecified: Secondary | ICD-10-CM | POA: Diagnosis not present

## 2020-11-21 DIAGNOSIS — N1832 Chronic kidney disease, stage 3b: Secondary | ICD-10-CM | POA: Diagnosis not present

## 2020-11-21 DIAGNOSIS — Z95820 Peripheral vascular angioplasty status with implants and grafts: Secondary | ICD-10-CM | POA: Diagnosis not present

## 2020-11-21 DIAGNOSIS — E1122 Type 2 diabetes mellitus with diabetic chronic kidney disease: Secondary | ICD-10-CM | POA: Diagnosis not present

## 2020-11-21 DIAGNOSIS — I130 Hypertensive heart and chronic kidney disease with heart failure and stage 1 through stage 4 chronic kidney disease, or unspecified chronic kidney disease: Secondary | ICD-10-CM | POA: Diagnosis not present

## 2020-11-21 DIAGNOSIS — R2681 Unsteadiness on feet: Secondary | ICD-10-CM | POA: Diagnosis not present

## 2020-11-24 ENCOUNTER — Other Ambulatory Visit: Payer: Self-pay

## 2020-11-24 ENCOUNTER — Encounter: Payer: Self-pay | Admitting: Surgery

## 2020-11-24 ENCOUNTER — Ambulatory Visit (INDEPENDENT_AMBULATORY_CARE_PROVIDER_SITE_OTHER): Payer: Self-pay | Admitting: Surgery

## 2020-11-24 VITALS — BP 136/65 | HR 79 | Temp 97.9°F | Resp 20 | Ht 62.0 in | Wt 159.0 lb

## 2020-11-24 DIAGNOSIS — R2681 Unsteadiness on feet: Secondary | ICD-10-CM | POA: Diagnosis not present

## 2020-11-24 DIAGNOSIS — E1122 Type 2 diabetes mellitus with diabetic chronic kidney disease: Secondary | ICD-10-CM | POA: Diagnosis not present

## 2020-11-24 DIAGNOSIS — Z95820 Peripheral vascular angioplasty status with implants and grafts: Secondary | ICD-10-CM | POA: Diagnosis not present

## 2020-11-24 DIAGNOSIS — J449 Chronic obstructive pulmonary disease, unspecified: Secondary | ICD-10-CM | POA: Diagnosis not present

## 2020-11-24 DIAGNOSIS — I70213 Atherosclerosis of native arteries of extremities with intermittent claudication, bilateral legs: Secondary | ICD-10-CM

## 2020-11-24 DIAGNOSIS — N1832 Chronic kidney disease, stage 3b: Secondary | ICD-10-CM | POA: Diagnosis not present

## 2020-11-24 DIAGNOSIS — M545 Low back pain, unspecified: Secondary | ICD-10-CM | POA: Diagnosis not present

## 2020-11-24 DIAGNOSIS — I5030 Unspecified diastolic (congestive) heart failure: Secondary | ICD-10-CM | POA: Diagnosis not present

## 2020-11-24 DIAGNOSIS — Z48812 Encounter for surgical aftercare following surgery on the circulatory system: Secondary | ICD-10-CM | POA: Diagnosis not present

## 2020-11-24 DIAGNOSIS — I130 Hypertensive heart and chronic kidney disease with heart failure and stage 1 through stage 4 chronic kidney disease, or unspecified chronic kidney disease: Secondary | ICD-10-CM | POA: Diagnosis not present

## 2020-11-24 NOTE — Progress Notes (Signed)
Patient name: [name] [name] MRN: [id number] DOB: Jan 03, 1946 Sex: female  REASON FOR VISIT:     post op  HISTORY OF PRESENT ILLNESS:   [name] [name] a 74y.o.female, who isback for follow up.In 2009 I took her for angiogram for claudication and she was found to have an 18 cm right superficial femoral artery occlusion which was successfully recanalized and stented. She then moved to [place]. She ultimately had a intervention in her left leg in [place] for claudication.I last saw her in 2019. At that time her symptoms were tolerable. She had tried cilostazol but did not have any benefit. She did not have any open wounds and so I elected to continue to monitor her. She has had a worsening of her symptoms. This is bordering on rest pain. On 10/04/2020 she underwent a right femoral endarterectomy with vein patch angioplasty followed by right femoral to below-knee popliteal artery bypass graft with Gore-Tex.    The patient suffers from hypercholesterolemia which is managed with a statin. She is medically managed for hypertension. She is a current smoker. She does suffer from COPD.  CURRENT MEDICATIONS:    Current Outpatient Medications  Medication Sig Dispense Refill  . acetaminophen (TYLENOL) 500 MG tablet Take 500-1,000 mg by mouth every 6 (six) hours as needed for moderate pain.     Marland Kitchen albuterol (ACCUNEB) 0.63 MG/3ML nebulizer solution Inhale 1 vial via nebulizer every 6 hours for wheezing as needed (Patient taking differently: Take 1 ampule by nebulization every 6 (six) hours as needed for wheezing or shortness of breath. ) 75 mL 11  . albuterol (VENTOLIN HFA) 108 (90 Base) MCG/ACT inhaler Inhale 2 puffs by mouth every 6 hours as needed for wheezing or shortness of breath (Patient taking differently: Inhale 2 puffs into the lungs every 6 (six) hours as needed for wheezing or shortness of breath. ) 8.5 g 11  . atorvastatin  (LIPITOR) 80 MG tablet Take 1 tablet (80 mg total) by mouth daily. 90 tablet 3  . Blood Glucose Monitoring Suppl (ONETOUCH VERIO) w/Device KIT 1 Device by Does not apply route daily. 1 kit 0  . BREO ELLIPTA 100-25 MCG/INH AEPB Inhale 1 puff by mouth every day (Patient taking differently: Inhale 1 puff into the lungs daily. ) 60 each 11  . clopidogrel (PLAVIX) 75 MG tablet Take 1 tablet by mouth every day 30 tablet 11  . cyclobenzaprine (FLEXERIL) 10 MG tablet Take 1 tablet by mouth 3 times a day as needed for muscle spasm (Patient taking differently: Take 10 mg by mouth 3 (three) times daily as needed for muscle spasms. ) 90 tablet 3  . DULoxetine (CYMBALTA) 60 MG capsule Take 1 capsule (60 mg total) by mouth daily. 90 capsule 3  . empagliflozin (JARDIANCE) 10 MG TABS tablet Take 1 tablet (10 mg total) by mouth daily. 30 tablet 11  . ergocalciferol (VITAMIN D2) 1.25 MG (50000 UT) capsule Take 50,000 Units by mouth every Monday.    . famotidine (PEPCID) 20 MG tablet Take 2 tablets by mouth every day (Patient taking differently: Take 40 mg by mouth daily. ) 60 tablet 11  . furosemide (LASIX) 20 MG tablet Take 1 tablet (20 mg total) by mouth daily. 30 tablet 3  . gabapentin (NEURONTIN) 300 MG capsule Take 1 capsule (300 mg total) by mouth 3 (three) times daily. X 1 week, then 600 mg BID- long term 600 mg 2x/day-  for nerve pain (Patient taking differently: Take 300 mg by  mouth 3 (three) times daily. ) 360 capsule 3  . glucose blood (ONETOUCH VERIO) test strip Use to check blood sugar once a day 100 each 12  . hydrALAZINE (APRESOLINE) 50 MG tablet Take 50 mg by mouth 3 (three) times daily.    . Lancets (ONETOUCH ULTRASOFT) lancets Use to check blood sugar once a day 100 each 12  . lidocaine (XYLOCAINE) 5 % ointment Apply 1 application topically as needed. Apply to R>L feet when burning up to 4x/day as needed 50 g 3  . metFORMIN (GLUCOPHAGE) 500 MG tablet Take 1 tablet (500 mg total) by mouth daily with  breakfast. (Patient taking differently: Take 500 mg by mouth 2 (two) times daily with a meal. ) 30 tablet 11  . montelukast (SINGULAIR) 10 MG tablet Take 1 tablet by mouth at bedtime (Patient taking differently: Take 10 mg by mouth at bedtime. ) 30 tablet 11  . nitroGLYCERIN (NITROSTAT) 0.4 MG SL tablet Dissolve 1 tablet under tongue every 5 minutes, up to 3 doses for chest pain (Patient taking differently: Place 0.4 mg under the tongue every 5 (five) minutes as needed for chest pain. up to 3 doses) 25 tablet 11  . traMADol (ULTRAM) 50 MG tablet Take 1 tablet (50 mg total) by mouth 2 (two) times daily as needed for moderate pain or severe pain. 30 tablet 5   No current facility-administered medications for this visit.    REVIEW OF SYSTEMS:   _0  denotes positive finding, _1  denotes negative finding Cardiac  Comments:  Chest pain or chest pressure:    Shortness of breath upon exertion:    Short of breath when lying flat:    Irregular heart rhythm:    Constitutional    Fever or chills:      PHYSICAL EXAM:   Vitals:   11/24/20 1542  BP: 136/65  Pulse: 79  Resp: 20  Temp: 97.9 F (36.6 C)  SpO2: 95%  Weight: 159 lb (72.1 kg)  Height: _2  (1.575 m)    GENERAL: The patient is a well-nourished female, in no acute distress. The vital signs are documented above. CARDIOVASCULAR: There is a regular rate and rhythm. PULMONARY: Non-labored respirations Incisions are healing nicely.  She does have 1-2+ edema to the right leg  STUDIES:   None  MEDICAL ISSUES:   Right leg is warm and well-perfused.  She will follow-up in 3 months with duplex and ABI of the right leg.  I am placing her in compression stockings (15-20 to help with her swelling.  Also encouraged her to keep her leg elevated.   Leia Alf, MD, FACS [facility] [phone] Pager [phone]

## 2020-11-25 ENCOUNTER — Other Ambulatory Visit: Payer: Medicare Other

## 2020-11-25 ENCOUNTER — Other Ambulatory Visit: Payer: Self-pay

## 2020-11-25 DIAGNOSIS — I70213 Atherosclerosis of native arteries of extremities with intermittent claudication, bilateral legs: Secondary | ICD-10-CM

## 2020-11-26 DIAGNOSIS — Z48812 Encounter for surgical aftercare following surgery on the circulatory system: Secondary | ICD-10-CM | POA: Diagnosis not present

## 2020-11-26 DIAGNOSIS — R2681 Unsteadiness on feet: Secondary | ICD-10-CM | POA: Diagnosis not present

## 2020-11-26 DIAGNOSIS — J449 Chronic obstructive pulmonary disease, unspecified: Secondary | ICD-10-CM | POA: Diagnosis not present

## 2020-11-26 DIAGNOSIS — I130 Hypertensive heart and chronic kidney disease with heart failure and stage 1 through stage 4 chronic kidney disease, or unspecified chronic kidney disease: Secondary | ICD-10-CM | POA: Diagnosis not present

## 2020-11-26 DIAGNOSIS — I70213 Atherosclerosis of native arteries of extremities with intermittent claudication, bilateral legs: Secondary | ICD-10-CM | POA: Diagnosis not present

## 2020-11-26 DIAGNOSIS — I5030 Unspecified diastolic (congestive) heart failure: Secondary | ICD-10-CM | POA: Diagnosis not present

## 2020-11-26 DIAGNOSIS — M545 Low back pain, unspecified: Secondary | ICD-10-CM | POA: Diagnosis not present

## 2020-11-26 DIAGNOSIS — E1122 Type 2 diabetes mellitus with diabetic chronic kidney disease: Secondary | ICD-10-CM | POA: Diagnosis not present

## 2020-11-26 DIAGNOSIS — Z95820 Peripheral vascular angioplasty status with implants and grafts: Secondary | ICD-10-CM | POA: Diagnosis not present

## 2020-11-26 DIAGNOSIS — N1832 Chronic kidney disease, stage 3b: Secondary | ICD-10-CM | POA: Diagnosis not present

## 2020-12-02 DIAGNOSIS — E1122 Type 2 diabetes mellitus with diabetic chronic kidney disease: Secondary | ICD-10-CM | POA: Diagnosis not present

## 2020-12-02 DIAGNOSIS — Z95820 Peripheral vascular angioplasty status with implants and grafts: Secondary | ICD-10-CM | POA: Diagnosis not present

## 2020-12-02 DIAGNOSIS — I70213 Atherosclerosis of native arteries of extremities with intermittent claudication, bilateral legs: Secondary | ICD-10-CM | POA: Diagnosis not present

## 2020-12-02 DIAGNOSIS — J449 Chronic obstructive pulmonary disease, unspecified: Secondary | ICD-10-CM | POA: Diagnosis not present

## 2020-12-02 DIAGNOSIS — Z48812 Encounter for surgical aftercare following surgery on the circulatory system: Secondary | ICD-10-CM | POA: Diagnosis not present

## 2020-12-02 DIAGNOSIS — M545 Low back pain, unspecified: Secondary | ICD-10-CM | POA: Diagnosis not present

## 2020-12-02 DIAGNOSIS — N1832 Chronic kidney disease, stage 3b: Secondary | ICD-10-CM | POA: Diagnosis not present

## 2020-12-02 DIAGNOSIS — I130 Hypertensive heart and chronic kidney disease with heart failure and stage 1 through stage 4 chronic kidney disease, or unspecified chronic kidney disease: Secondary | ICD-10-CM | POA: Diagnosis not present

## 2020-12-02 DIAGNOSIS — I5030 Unspecified diastolic (congestive) heart failure: Secondary | ICD-10-CM | POA: Diagnosis not present

## 2020-12-02 DIAGNOSIS — R2681 Unsteadiness on feet: Secondary | ICD-10-CM | POA: Diagnosis not present

## 2020-12-03 DIAGNOSIS — N1832 Chronic kidney disease, stage 3b: Secondary | ICD-10-CM | POA: Diagnosis not present

## 2020-12-03 DIAGNOSIS — J449 Chronic obstructive pulmonary disease, unspecified: Secondary | ICD-10-CM | POA: Diagnosis not present

## 2020-12-03 DIAGNOSIS — R2681 Unsteadiness on feet: Secondary | ICD-10-CM | POA: Diagnosis not present

## 2020-12-03 DIAGNOSIS — I70213 Atherosclerosis of native arteries of extremities with intermittent claudication, bilateral legs: Secondary | ICD-10-CM | POA: Diagnosis not present

## 2020-12-03 DIAGNOSIS — Z95820 Peripheral vascular angioplasty status with implants and grafts: Secondary | ICD-10-CM | POA: Diagnosis not present

## 2020-12-03 DIAGNOSIS — M545 Low back pain, unspecified: Secondary | ICD-10-CM | POA: Diagnosis not present

## 2020-12-03 DIAGNOSIS — I130 Hypertensive heart and chronic kidney disease with heart failure and stage 1 through stage 4 chronic kidney disease, or unspecified chronic kidney disease: Secondary | ICD-10-CM | POA: Diagnosis not present

## 2020-12-03 DIAGNOSIS — E1122 Type 2 diabetes mellitus with diabetic chronic kidney disease: Secondary | ICD-10-CM | POA: Diagnosis not present

## 2020-12-03 DIAGNOSIS — I5030 Unspecified diastolic (congestive) heart failure: Secondary | ICD-10-CM | POA: Diagnosis not present

## 2020-12-03 DIAGNOSIS — Z48812 Encounter for surgical aftercare following surgery on the circulatory system: Secondary | ICD-10-CM | POA: Diagnosis not present

## 2020-12-04 NOTE — Progress Notes (Signed)
HEMATOLOGY/ONCOLOGY CLINIC NOTE  Date of Service: 12/05/20    Patient Care Team: Danna Hefty, DO as PCP - General (Family Medicine) Sueanne Margarita, MD as PCP - Cardiology (Cardiology) Lazaro Arms, RN as [place] Management  CHIEF COMPLAINTS/PURPOSE OF CONSULTATION:  F/u for CLL   HISTORY OF PRESENTING ILLNESS:  [name] [name] is a wonderful 74 y.o. female smoker with history of CLL who has been referred to [place] by Dr Tarry Kos, Archie Endo, DO at [facility] for evaluation and management of elevated WBC/lymphocytes.   She is accompanied by her husband. She reported to ED on 11/27/2017 with cough and abdominal pain with COPD exacerbation. Her labs on this day showed WBC elevated at 62.5. She has not previously seen a hematologist. She reports a plan to quit smoking on the first day of the new year. She reports worsening decrease in balance and states she has a cane and walker that she uses as needed. She was taking Prednisone.  No weight loss/fevers/chills/night sweats/ palpable lumps or bumps.  On review of systems, pt reports back pain, change in balance, abdominal pain, weight loss and denies changes in BM, fever and any other accompanying symptoms.   INTERVAL HISTORY:  [name] [name] is here for management and evaluation of her Chronic Leukocytic Leukemia. We are joined today by her husband [name] [name]. The patient's last visit with [place] was on 08/05/2020. The pt reports that she is doing well overall.  The pt reports that she had a right femoral endarterectomy on 10/03/20. Pt notes pain at the surgical site but has improvement in right leg pain and cramping. Pt had a recent follow up with Dr. Trula Slade, who felt that the swelling and tenderness was normal. She had a minor fall last month and denies any major injury. She has an upcoming visit with Dr. Royce Macadamia on 12/10/20.   Lab results today (12/05/20) of CBC w/diff and CMP is as follows: all values  are WNL except for WBC at 180.8K, RBC at 3.61, Hgb at 8.6, HCT at 30.2, MCH at 23.8, MCHC at 28.5, RDW at 20.1, Neutro Abs at 9.7K, Lymphs Abs at 140.9K, Mono Abs at 29.1K, Baso Abs at 0.2K, Abs Immature Granulocytes at 0.82K, Glucose at 188, Creatinine at 1.94, ALP at 222, GFR Est at 27. 12/05/2020 LDH at 349  On review of systems, pt reports discomfort at surgical site and denies fatigue, fevers, chills, night sweats, right leg cramping, new lumps/bumps and any other symptoms.   MEDICAL HISTORY:  Past Medical History:  Diagnosis Date  . Allergy    environmental  . Asthma   . CKD (chronic kidney disease)   . CLL (chronic lymphoblastic leukemia)   . Constipation 11/15/2011  . COPD (chronic obstructive pulmonary disease) ([place])   . Diabetes mellitus without complication ([place])   . GERD (gastroesophageal reflux disease)   . Headache    migraines  . Heart murmur   . Hyperlipidemia   . Hypertension   . PVD (peripheral vascular disease) ([place])   . Stroke [employer]) 1998   in 1998 due to tumor-right side    SURGICAL HISTORY: Past Surgical History:  Procedure Laterality Date  . ABDOMINAL AORTOGRAM W/LOWER EXTREMITY Bilateral 08/12/2020   Procedure: ABDOMINAL AORTOGRAM W/BILATERAL LOWER EXTREMITY RUNOFF;  Surgeon: Serafina Mitchell, MD;  Location: [facility];  Service: Cardiovascular;  Laterality: Bilateral;  . ABI  02/2012   ABI <0.65 BL 02/2012  . BRAIN MENINGIOMA  EXCISION    . Bypass grafting of RLE for PAD claudication     . CARDIAC CATHETERIZATION  04/09/208  . CESAREAN SECTION     1974, 78 ,80  . CHOLECYSTECTOMY, LAPAROSCOPIC    . COLONOSCOPY  2014   [place], [place]  . ENDARTERECTOMY FEMORAL Right 10/03/2020   Procedure: RIGHT FEMORAL ENDARTERECTOMY WITH VEIN PATCH ANGIOPLASTY;  Surgeon: Serafina Mitchell, MD;  Location: [facility];  Service: Vascular;  Laterality: Right;  . ESOPHAGEAL DILATION    . FEMORAL-POPLITEAL BYPASS GRAFT Right 10/03/2020   Procedure: RIGHT FEMORAL- BELOW KNEE  POPLITEAL BYPASS USING GORE PROPATEN 6MM GRAFT;  Surgeon: Serafina Mitchell, MD;  Location: [facility];  Service: Vascular;  Laterality: Right;  . PATCH ANGIOPLASTY  10/03/2020   Procedure: VEIN PATCH ANGIOPLASTY;  Surgeon: Serafina Mitchell, MD;  Location: [facility];  Service: Vascular;;    SOCIAL HISTORY: Social History   Socioeconomic History  . Marital status: Married    Spouse name: Not on file  . Number of children: 3  . Years of education: Not on file  . Highest education level: Not on file  Occupational History  . Occupation: DISABLED    Employer: DISABLED  Tobacco Use  . Smoking status: Current Every Day Smoker    Packs/day: 0.25    Years: 30.00    Pack years: 7.50    Types: Cigarettes    Start date: 01/01/1976  . Smokeless tobacco: Never Used  . Tobacco comment: ~ 3 cigarettes / day  Vaping Use  . Vaping Use: Never used  Substance and Sexual Activity  . Alcohol use: No    Alcohol/week: 0.0 standard drinks  . Drug use: No  . Sexual activity: Yes  Other Topics Concern  . Not on file  Social History Narrative   Health Care POA:    Emergency Contact: [name] [name] [phone] (c)   End of Life Plan:    Who lives with you: Lives with husband   Any pets: none   Diet: Patient lacks financial resources for much food. Pt reports eating what is available.   Exercise: Patient does not have an exercise plan.   Seatbelts: Patient reports wearing seatbelt when in vehicle.    [name] Exposure/Protection:   Hobbies: Bowling, computer games, Bingo      Has financial difficulties and transportation issues as she and her husband share transportation       Social Determinants of Radio broadcast assistant Strain: Not on file  Food Insecurity: Not on file  Transportation Needs: Not on file  Physical Activity: Not on file  Stress: Not on file  Social Connections: Not on file  Intimate Partner Violence: Not on file    FAMILY HISTORY: Family History  Problem Relation Age of Onset  .  Heart disease Mother   . Asthma Mother   . Cancer Mother        uterine   . Depression Mother   . Heart attack Mother 8  . Hyperlipidemia Mother   . Hypertension Mother   . Stroke Mother   . Kidney disease Mother   . Heart attack Sister   . Stroke Sister   . Depression Sister   . Diabetes Sister   . Hyperlipidemia Sister   . Depression Sister   . Diabetes Sister   . Hyperlipidemia Sister   . HIV/AIDS Sister   . Diabetes Brother   . Asthma Brother   . Hypertension Brother   . Cancer Maternal Aunt  lung  . Heart disease Maternal Grandmother   . Heart attack Maternal Grandmother   . Cancer Brother        colon  . HIV/AIDS Brother   . Colon cancer Neg Hx     ALLERGIES:  is allergic to no healthtouch food allergies, aspirin, and baclofen.  MEDICATIONS:  Current Outpatient Medications  Medication Sig Dispense Refill  . acetaminophen (TYLENOL) 500 MG tablet Take 500-1,000 mg by mouth every 6 (six) hours as needed for moderate pain.     Marland Kitchen albuterol (ACCUNEB) 0.63 MG/3ML nebulizer solution Inhale 1 vial via nebulizer every 6 hours for wheezing as needed (Patient taking differently: Take 1 ampule by nebulization every 6 (six) hours as needed for wheezing or shortness of breath. ) 75 mL 11  . albuterol (VENTOLIN HFA) 108 (90 Base) MCG/ACT inhaler Inhale 2 puffs by mouth every 6 hours as needed for wheezing or shortness of breath (Patient taking differently: Inhale 2 puffs into the lungs every 6 (six) hours as needed for wheezing or shortness of breath. ) 8.5 g 11  . atorvastatin (LIPITOR) 80 MG tablet Take 1 tablet (80 mg total) by mouth daily. 90 tablet 3  . Blood Glucose Monitoring Suppl (ONETOUCH VERIO) w/Device KIT 1 Device by Does not apply route daily. 1 kit 0  . BREO ELLIPTA 100-25 MCG/INH AEPB Inhale 1 puff by mouth every day (Patient taking differently: Inhale 1 puff into the lungs daily. ) 60 each 11  . clopidogrel (PLAVIX) 75 MG tablet Take 1 tablet by mouth every  day 30 tablet 11  . cyclobenzaprine (FLEXERIL) 10 MG tablet Take 1 tablet by mouth 3 times a day as needed for muscle spasm (Patient taking differently: Take 10 mg by mouth 3 (three) times daily as needed for muscle spasms. ) 90 tablet 3  . DULoxetine (CYMBALTA) 60 MG capsule Take 1 capsule (60 mg total) by mouth daily. 90 capsule 3  . empagliflozin (JARDIANCE) 10 MG TABS tablet Take 1 tablet (10 mg total) by mouth daily. 30 tablet 11  . ergocalciferol (VITAMIN D2) 1.25 MG (50000 UT) capsule Take 50,000 Units by mouth every Monday.    . famotidine (PEPCID) 20 MG tablet Take 2 tablets by mouth every day (Patient taking differently: Take 40 mg by mouth daily. ) 60 tablet 11  . furosemide (LASIX) 20 MG tablet Take 1 tablet (20 mg total) by mouth daily. 30 tablet 3  . gabapentin (NEURONTIN) 300 MG capsule Take 1 capsule (300 mg total) by mouth 3 (three) times daily. X 1 week, then 600 mg BID- long term 600 mg 2x/day-  for nerve pain (Patient taking differently: Take 300 mg by mouth 3 (three) times daily. ) 360 capsule 3  . glucose blood (ONETOUCH VERIO) test strip Use to check blood sugar once a day 100 each 12  . hydrALAZINE (APRESOLINE) 50 MG tablet Take 50 mg by mouth 3 (three) times daily.    . Lancets (ONETOUCH ULTRASOFT) lancets Use to check blood sugar once a day 100 each 12  . lidocaine (XYLOCAINE) 5 % ointment Apply 1 application topically as needed. Apply to R>L feet when burning up to 4x/day as needed 50 g 3  . metFORMIN (GLUCOPHAGE) 500 MG tablet Take 1 tablet (500 mg total) by mouth daily with breakfast. (Patient taking differently: Take 500 mg by mouth 2 (two) times daily with a meal. ) 30 tablet 11  . montelukast (SINGULAIR) 10 MG tablet Take 1 tablet by mouth at bedtime (Patient  taking differently: Take 10 mg by mouth at bedtime. ) 30 tablet 11  . nitroGLYCERIN (NITROSTAT) 0.4 MG SL tablet Dissolve 1 tablet under tongue every 5 minutes, up to 3 doses for chest pain (Patient taking  differently: Place 0.4 mg under the tongue every 5 (five) minutes as needed for chest pain. up to 3 doses) 25 tablet 11  . traMADol (ULTRAM) 50 MG tablet Take 1 tablet (50 mg total) by mouth 2 (two) times daily as needed for moderate pain or severe pain. 30 tablet 5   No current facility-administered medications for this visit.    REVIEW OF SYSTEMS:   A 10+ POINT REVIEW OF SYSTEMS WAS OBTAINED including neurology, dermatology, psychiatry, cardiac, respiratory, lymph, extremities, GI, GU, Musculoskeletal, constitutional, breasts, reproductive, HEENT.  All pertinent positives are noted in the HPI.  All others are negative.   PHYSICAL EXAMINATION: ECOG FS:2 - Symptomatic, <50% confined to bed  There were no vitals filed for this visit. Wt Readings from Last 3 Encounters:  11/24/20 159 lb (72.1 kg)  10/03/20 159 lb 13.3 oz (72.5 kg)  09/24/20 141 lb 6.4 oz (64.1 kg)   There is no height or weight on file to calculate BMI.     Exam was given in a chair  GENERAL:alert, in no acute distress and comfortable SKIN: no acute rashes, no significant lesions EYES: conjunctiva are pink and non-injected, sclera anicteric OROPHARYNX: MMM, no exudates, no oropharyngeal erythema or ulceration NECK: supple, no JVD LYMPH:  no palpable lymphadenopathy in the cervical, axillary or inguinal regions.  LUNGS: clear to auscultation b/l with normal respiratory effort HEART: regular rate & rhythm ABDOMEN:  normoactive bowel sounds , non tender, not distended. No palpable hepatosplenomegaly.  Extremity: no pedal edema PSYCH: alert & oriented x 3 with fluent speech NEURO: no focal motor/sensory deficits  LABORATORY DATA:  I have reviewed the data as listed  . CBC Latest Ref Rng & Units 12/05/2020 10/04/2020 10/03/2020  WBC 4.0 - 10.5 K/uL 180.8(HH) 123.3(HH) 116.3(HH)  Hemoglobin 12.0 - 15.0 g/dL 8.6(L) 8.9(L) 8.9(L)  Hematocrit 36.0 - 46.0 % 30.2(L) 31.5(L) 30.6(L)  Platelets 150 - 400 K/uL 256 195 199     . CMP Latest Ref Rng & Units 12/05/2020 10/08/2020 10/07/2020  Glucose 70 - 99 mg/dL 188(H) 125(H) 123(H)  BUN 8 - 23 mg/dL '15 12 12  ' Creatinine 0.44 - 1.00 mg/dL 1.94(H) 1.37(H) 1.43(H)  Sodium 135 - 145 mmol/L 141 139 143  Potassium 3.5 - 5.1 mmol/L 4.7 4.4 4.0  Chloride 98 - 111 mmol/L 102 105 109  CO2 22 - 32 mmol/L '27 27 26  ' Calcium 8.9 - 10.3 mg/dL 9.2 8.4(L) 8.3(L)  Total Protein 6.5 - 8.1 g/dL 7.0 - -  Total Bilirubin 0.3 - 1.2 mg/dL 0.3 - -  Alkaline Phos 38 - 126 U/L 222(H) - -  AST 15 - 41 U/L 18 - -  ALT 0 - 44 U/L 16 - -   . Lab Results  Component Value Date   LDH 349 (H) 12/05/2020        . Lab Results  Component Value Date   LDH 349 (H) 12/05/2020    RADIOGRAPHIC STUDIES: I have personally reviewed the radiological images as listed and agreed with the findings in the report. No results found. Surgical Pathology 12/13/2017   ASSESSMENT & PLAN:   JOHANNY SEGERS is a wonderful 74 y.o. female with    1. Rai 1 Chronic Lymphocytic Leukemia  - Trisomy 12 mutation. No  associated thrombocytopenia.  Mild anemia - likely from acute blood loss from significant epistaxis. No constitutional symptoms. Minimal LNaednopathy No splenomegaly  #2 .Lung nodules on CT in 12/2017. Rpt CT chest 08/09/2018 - stable  #3 Patient Active Problem List   Diagnosis Date Noted  . Peripheral arterial disease ([place]) 10/03/2020  . (HFpEF) heart failure with preserved ejection fraction ([place]) 06/11/2020  . Venous insufficiency (chronic) (peripheral) 06/22/2019  . Pulmonary nodules 01/04/2018  . Mass of left lower leg 01/04/2018  . Chronic kidney disease (CKD), stage III (moderate) ([place]) 06/04/2016  . Chronic back pain 10/02/2015  . DM (diabetes mellitus), type 2 with peripheral vascular complications ([place]) 61/90/1222  . Chronic lymphocytic leukemia ([place]) 04/11/2009  . PERIPHERAL VASCULAR DISEASE 10/13/2007  . HYPERCHOLESTEROLEMIA 02/23/2007  . ANXIETY 02/23/2007  .  TOBACCO DEPENDENCE 02/23/2007  . Essential hypertension 02/23/2007  . COPD (chronic obstructive pulmonary disease) ([place]) 02/23/2007  . GASTROESOPHAGEAL REFLUX, NO ESOPHAGITIS 02/23/2007   PLAN: -Discussed pt labwork today, 12/05/20; WBC progressively increasing, anemia, PLT are nml, kidney numbers have bumped up, ALP is high, LDH progressively increasing.  -Advised pt that anemia is likely from blood loss from surgery. CLL and/or nutritional deficiencies may be preventing Hgb from normalizing at this time.  -Advised pt that there will be some anemia caused by her CKD. -Unclear if CLL progression is significant enough to begin treatment at this time. If pt still anemic despite adequate iron/b vitamin replacement and after healing from surgery this would be an indication to begin treatment.  -Recommend pt begin taking PO Iron and B-complex daily.  -Recommend pt f/u with Dr. Harrie Jeans as scheduled. -Rx Iron Polysaccharide  -Will see back in 2 months with labs    FOLLOW UP: RTC with Dr Irene Limbo with labs in 2 months   The total time spent in the appt was 20 minutes and more than 50% was on counseling and direct patient cares.  All of the patient's questions were answered with apparent satisfaction. The patient knows to call the clinic with any problems, questions or concerns.    Sullivan Lone MD [place] AAHIVMS [facility] Hematology/Oncology Physician [facility]  (Office):       [phone] (Work cell):  [phone] (Fax):           [phone]  I, Yevette Edwards, am acting as a scribe for Dr. Sullivan Lone.   .I have reviewed the above documentation for accuracy and completeness, and I agree with the above. Brunetta Genera MD

## 2020-12-05 ENCOUNTER — Telehealth: Payer: Self-pay | Admitting: Hematology

## 2020-12-05 ENCOUNTER — Other Ambulatory Visit: Payer: Self-pay

## 2020-12-05 ENCOUNTER — Telehealth: Payer: Self-pay

## 2020-12-05 ENCOUNTER — Inpatient Hospital Stay: Payer: Medicare Other | Attending: Hematology

## 2020-12-05 ENCOUNTER — Inpatient Hospital Stay (HOSPITAL_BASED_OUTPATIENT_CLINIC_OR_DEPARTMENT_OTHER): Payer: Medicare Other | Admitting: Hematology

## 2020-12-05 VITALS — BP 120/51 | HR 78 | Temp 96.2°F | Resp 18 | Ht 62.0 in | Wt 146.3 lb

## 2020-12-05 DIAGNOSIS — R918 Other nonspecific abnormal finding of lung field: Secondary | ICD-10-CM | POA: Insufficient documentation

## 2020-12-05 DIAGNOSIS — D649 Anemia, unspecified: Secondary | ICD-10-CM | POA: Insufficient documentation

## 2020-12-05 DIAGNOSIS — C9110 Chronic lymphocytic leukemia of B-cell type not having achieved remission: Secondary | ICD-10-CM | POA: Diagnosis not present

## 2020-12-05 LAB — CMP (CANCER CENTER ONLY)
ALT: 16 U/L (ref 0–44)
AST: 18 U/L (ref 15–41)
Albumin: 3.8 g/dL (ref 3.5–5.0)
Alkaline Phosphatase: 222 U/L — ABNORMAL HIGH (ref 38–126)
Anion gap: 12 (ref 5–15)
BUN: 15 mg/dL (ref 8–23)
CO2: 27 mmol/L (ref 22–32)
Calcium: 9.2 mg/dL (ref 8.9–10.3)
Chloride: 102 mmol/L (ref 98–111)
Creatinine: 1.94 mg/dL — ABNORMAL HIGH (ref 0.44–1.00)
GFR, Estimated: 27 mL/min — ABNORMAL LOW (ref >60)
Glucose, Bld: 188 mg/dL — ABNORMAL HIGH (ref 70–99)
Potassium: 4.7 mmol/L (ref 3.5–5.1)
Sodium: 141 mmol/L (ref 135–145)
Total Bilirubin: 0.3 mg/dL (ref 0.3–1.2)
Total Protein: 7 g/dL (ref 6.5–8.1)

## 2020-12-05 LAB — CBC WITH DIFFERENTIAL/PLATELET
Abs Immature Granulocytes: 0.82 10*3/uL — ABNORMAL HIGH (ref 0.00–0.07)
Basophils Absolute: 0.2 10*3/uL — ABNORMAL HIGH (ref 0.0–0.1)
Basophils Relative: 0 %
Eosinophils Absolute: 0.1 10*3/uL (ref 0.0–0.5)
Eosinophils Relative: 0 %
HCT: 30.2 % — ABNORMAL LOW (ref 36.0–46.0)
Hemoglobin: 8.6 g/dL — ABNORMAL LOW (ref 12.0–15.0)
Immature Granulocytes: 1 %
Lymphocytes Relative: 78 %
Lymphs Abs: 140.9 10*3/uL — ABNORMAL HIGH (ref 0.7–4.0)
MCH: 23.8 pg — ABNORMAL LOW (ref 26.0–34.0)
MCHC: 28.5 g/dL — ABNORMAL LOW (ref 30.0–36.0)
MCV: 83.7 fL (ref 80.0–100.0)
Monocytes Absolute: 29.1 10*3/uL — ABNORMAL HIGH (ref 0.1–1.0)
Monocytes Relative: 16 %
Neutro Abs: 9.7 10*3/uL — ABNORMAL HIGH (ref 1.7–7.7)
Neutrophils Relative %: 5 %
Platelets: 256 10*3/uL (ref 150–400)
RBC: 3.61 MIL/uL — ABNORMAL LOW (ref 3.87–5.11)
RDW: 20.1 % — ABNORMAL HIGH (ref 11.5–15.5)
WBC: 180.8 10*3/uL (ref 4.0–10.5)
nRBC: 0.1 % (ref 0.0–0.2)

## 2020-12-05 LAB — LACTATE DEHYDROGENASE: LDH: 349 U/L — ABNORMAL HIGH (ref 98–192)

## 2020-12-05 MED ORDER — POLYSACCHARIDE IRON COMPLEX 150 MG PO CAPS: 150.0000 mg | ORAL_CAPSULE | Freq: Every day | ORAL | 5 refills | Status: DC

## 2020-12-05 MED ORDER — B COMPLEX VITAMINS PO CAPS: 1.0000 | ORAL_CAPSULE | Freq: Every day | ORAL | 5 refills | Status: DC

## 2020-12-05 NOTE — Telephone Encounter (Signed)
Scheduled per 12/10 los, patient has received updated calender.

## 2020-12-05 NOTE — Progress Notes (Signed)
MD notified of WBC 180 - no new orders received, information acknowledged

## 2020-12-05 NOTE — Telephone Encounter (Signed)
CRITICAL VALUE STICKER  CRITICAL VALUE: WBC = 181  RECEIVER (on-site recipient of call): Yetta Glassman, [place] NOTIFIED: 12/05/20 at 9:18am  MESSENGER (representative from lab): Anderson Malta  MD NOTIFIED: Dr. Irene Limbo  TIME OF NOTIFICATION: 9:20am  RESPONSE: Notification given to Carlyon Prows, RN for follow-up with the provider

## 2020-12-10 DIAGNOSIS — E559 Vitamin D deficiency, unspecified: Secondary | ICD-10-CM | POA: Diagnosis not present

## 2020-12-10 DIAGNOSIS — N1832 Chronic kidney disease, stage 3b: Secondary | ICD-10-CM | POA: Diagnosis not present

## 2020-12-10 DIAGNOSIS — N179 Acute kidney failure, unspecified: Secondary | ICD-10-CM | POA: Diagnosis not present

## 2020-12-10 DIAGNOSIS — E1122 Type 2 diabetes mellitus with diabetic chronic kidney disease: Secondary | ICD-10-CM | POA: Diagnosis not present

## 2020-12-10 DIAGNOSIS — I129 Hypertensive chronic kidney disease with stage 1 through stage 4 chronic kidney disease, or unspecified chronic kidney disease: Secondary | ICD-10-CM | POA: Diagnosis not present

## 2021-01-06 ENCOUNTER — Encounter: Payer: Self-pay | Admitting: Family Medicine

## 2021-01-06 ENCOUNTER — Ambulatory Visit (INDEPENDENT_AMBULATORY_CARE_PROVIDER_SITE_OTHER): Payer: Medicare Other | Admitting: Family Medicine

## 2021-01-06 ENCOUNTER — Other Ambulatory Visit: Payer: Self-pay

## 2021-01-06 VITALS — BP 110/60 | HR 100 | Ht 62.0 in | Wt 141.0 lb

## 2021-01-06 DIAGNOSIS — K219 Gastro-esophageal reflux disease without esophagitis: Secondary | ICD-10-CM

## 2021-01-06 DIAGNOSIS — E7800 Pure hypercholesterolemia, unspecified: Secondary | ICD-10-CM | POA: Diagnosis not present

## 2021-01-06 DIAGNOSIS — E1151 Type 2 diabetes mellitus with diabetic peripheral angiopathy without gangrene: Secondary | ICD-10-CM

## 2021-01-06 DIAGNOSIS — I739 Peripheral vascular disease, unspecified: Secondary | ICD-10-CM

## 2021-01-06 DIAGNOSIS — Z23 Encounter for immunization: Secondary | ICD-10-CM | POA: Diagnosis not present

## 2021-01-06 DIAGNOSIS — I872 Venous insufficiency (chronic) (peripheral): Secondary | ICD-10-CM

## 2021-01-06 DIAGNOSIS — Z0000 Encounter for general adult medical examination without abnormal findings: Secondary | ICD-10-CM

## 2021-01-06 DIAGNOSIS — J449 Chronic obstructive pulmonary disease, unspecified: Secondary | ICD-10-CM

## 2021-01-06 DIAGNOSIS — M545 Low back pain, unspecified: Secondary | ICD-10-CM | POA: Diagnosis not present

## 2021-01-06 DIAGNOSIS — N1832 Chronic kidney disease, stage 3b: Secondary | ICD-10-CM

## 2021-01-06 DIAGNOSIS — F17200 Nicotine dependence, unspecified, uncomplicated: Secondary | ICD-10-CM

## 2021-01-06 DIAGNOSIS — I10 Essential (primary) hypertension: Secondary | ICD-10-CM

## 2021-01-06 DIAGNOSIS — C9110 Chronic lymphocytic leukemia of B-cell type not having achieved remission: Secondary | ICD-10-CM

## 2021-01-06 DIAGNOSIS — G8929 Other chronic pain: Secondary | ICD-10-CM

## 2021-01-06 LAB — POCT GLYCOSYLATED HEMOGLOBIN (HGB A1C): Hemoglobin A1C: 8 % — AB (ref 4.0–5.6)

## 2021-01-06 MED ORDER — CYCLOBENZAPRINE HCL 10 MG PO TABS: 10.0000 mg | ORAL_TABLET | Freq: Three times a day (TID) | ORAL | 1 refills | Status: DC | PRN

## 2021-01-06 MED ORDER — EMPAGLIFLOZIN 25 MG PO TABS: 25.0000 mg | ORAL_TABLET | Freq: Every day | ORAL | 3 refills | Status: DC

## 2021-01-06 NOTE — Patient Instructions (Signed)
It was a pleasure to see you today!  Thank you for choosing [facility] for your primary care.   Our plans for today were:  Please start taking Jardiance 25mg  daily.   Continue Metformin 500mg  daily  Be careful taking Flexeril and Tramadol and these can be very sedating and lead to falls.   Follow up on 01/22/21 at 9:30am for lab visit  Follow up in 3 months for diabetes check up  Please expect call from [place] regarding lung cancer screening tset    To keep you healthy, please keep in mind the following health maintenance items that you are due for:   1. Please schedule your diabetic eye exam at earliest convenience and have records faxed to [facility] at [place],   Mina Marble, DO

## 2021-01-06 NOTE — Progress Notes (Signed)
SUBJECTIVE:   Chief compliant/HPI: annual examination  [name] [name] is a 75 y.o. who presents today for an annual exam.   PMH: CLL followed by oncology, HFpEF, T2DM, HTN, peripheral vascular disease, venous insufficiency, COPD, GERD, CKD III, anxiety, chronic baick pain, HLD, pulmonary nodules, tobacco dependence  Diabetes: Last three A1C's below. Currently on Jardiance 10mg  QD, Metformin 500mg  QD. Endorses compliance. Denies any hypoglycemia. Denies any polyuria, polydipsia, polyphagia. Due for diabetic eye exam. CKD III with most recent GFR of 36.  Lab Results  Component Value Date   HGBA1C 8.0 (A) 01/06/2021   HGBA1C 7.4 (H) 09/24/2020   HGBA1C 6.7 07/10/2020    HTN:  BP: 110/60 today. Currently on Hydralazine 50mg  TID and possibly Amlodipine 5mg  QD. Endorses compliance. Current everyday smoker. Denies any chest pain, SOB, vision changes, or headaches.    HLD: Last lipid panel below. Currently on Lipitor 80mg  QD. Endorses compliance. Denies any muscles aches or weakness.   Lab Results  Component Value Date   CHOL 103 10/04/2020   HDL 24 (L) 10/04/2020   LDLCALC 49 10/04/2020   LDLDIRECT 62 09/24/2010   TRIG 151 (H) 10/04/2020   CHOLHDL 4.3 10/04/2020   Social History: LMP: Postmenopausal since 51 Alcohol: none Tobacco: 6 cigarettes per day Illicit Drugs: none Safe at home: yes Depression/Suicidality: none  Health Maintenance: Health Maintenance Due  Topic  . OPHTHALMOLOGY EXAM   . TETANUS/TDAP    Review of systems: see HPI  OBJECTIVE:   BP 110/60   Pulse 100   Ht 5\' 2"  (1.575 m)   Wt 141 lb (64 kg)   SpO2 96%   BMI 25.79 kg/m   General: pleasant elderly woman, sitting comfortably in exam chair, well nourished, well developed, in no acute distress with non-toxic appearance, rolling walker in sitting in front of her HEENT: normocephalic, atraumatic, moist mucous membranes, oropharynx clear without erythema or exudate, TM normal bilaterally  CV:  regular rate and rhythm, 2/64murmur heard in all lung fields, without murmurs, +1 pitting edema in R leg, no edema in left Lungs: clear to auscultation bilaterally with normal work of breathing on room air, speaking in full sentences Abdomen: soft, non-tender, non-distended Skin: warm, dry Extremities: warm and well perfused, normal tone Neuro: Alert and oriented, speech normal  ASSESSMENT/PLAN:   Annual Physical Exam: Patient here today for annual physical exam.  PMH, surgical history, and social history were reviewed. The following concerns below were discussed.   Health Maintenance: Screen for Osteoporosis: Last DEXA scan in 11/2018 with normal bone density. - Recommend Vitamin and Calcium for prevention Colon Cancer Screening: last colonoscopy in 2019 with recommendation for repeat in 5 years. Due in 2024. Mammogram: Due in February 2022. Ordered. Lung cancer screening. Ordered  DM (diabetes mellitus), type 2 with peripheral vascular complications (HCC) Chronic, worsening.  - increase Jardiance to 25mg  QD - continue Metformin 500mg  QD (renally adjusted) - BMP in 2 weeks to monitor kidney function - Given wide range of BS reported by patient in phone message ([phone]), recommended patient to keep a log of blood sugars and follow up 1 month for closer monitoring - May consider starting Lantus to help improve sugar control vs GLP-1  - encouraged to schedule diabetic eye exam  Essential hypertension Chronic, well controlled. - continue Hydralazine 50mg  TID and Amlodipine 5mg  QD  Venous insufficiency (chronic) (peripheral) Chronic.  1+ pitting edema in ankle/mid shin. Improves with elevation and compression stocking. - continue elevation and compression stockings  COPD (chronic obstructive pulmonary disease) (HCC) Chronic. Endorses some worsening SOB with longer amounts of exertion. Denies chest pain. Denies wheezing or nighttime cough. Continues to be an everyday smoker.  -  continue albuterol PRN, Breo daily, Singulair - follow up if continues to worsen - strongly encouraged smoking cessation  GASTROESOPHAGEAL REFLUX, NO ESOPHAGITIS Chronic. Well controlled with pepcid - continue current meds  Chronic kidney disease (CKD), stage III (moderate) (HCC) Chronic. GFR ~36 based on last two BMP's. Diabetes worsening. Metformin renally dosed. BP well controlled.  - increase Jardiance 25mg  QD - avoid NSAIDs - follows with nephrology  Chronic back pain Chronic. Well managed with Flexeril TID PRN and Tramadol 50mg  BID PRN. - discussed CNS effects and risk of sedation. Patient voiced understanding and agreement with continuation of medication. - Continue current medications  Peripheral arterial disease (HCC) Chronic. Asymptomatic. Warm and well perfused. Underwent right femoral endarterectomy with vein patch angioplasty followed by right femoral to below-knee popliteal artery bypass graft with [place] by vascular surgery.  - follow up with [facility] in Feb 2022 for ABI and duplex as recommended  - continue to encourage compression stockings and leg elevation  Chronic lymphocytic leukemia ([place]) Follows with Dr. Irene Limbo with oncology. Plan as follows: "Unclear if CLL progression is significant enough to begin treatment at this time. If pt still anemic despite adequate iron/b vitamin replacement and after healing from surgery this would be an indication to begin treatment."  - Follow up in February  HYPERCHOLESTEROLEMIA Chronic.  - continue Lipitor 80mg  QD - Repeat lipid panel in October 2022  TOBACCO DEPENDENCE Currently smokes 6 cigarettes a day. History of smoking 0.5 pack/day x 46 years (23 pack year history). Does qualify for lung cancer screen at this time. Patient was counseled on the risks of tobacco use and cessation strongly encouraged. - Low dose CT scan ordered    Vaccinations: COVID booster and Flu vaccine given  Follow up in 1 month for diabetes follow up  or sooner if indicated.    [place]

## 2021-01-07 ENCOUNTER — Telehealth: Payer: Self-pay

## 2021-01-07 NOTE — Assessment & Plan Note (Signed)
Chronic.  - continue Lipitor 80mg  QD - Repeat lipid panel in October 2022

## 2021-01-07 NOTE — Telephone Encounter (Signed)
Called patient to inform her of CT Chest Lung Screening Scan:  01/16/2021 Appointment [place]  Patient verbalized understanding.   Patient would like for Dr. Tarry Kos to be aware that her blood sugar was 800 on yesterday.  Today it is 280 post eating.  Ozella Almond, [place]

## 2021-01-07 NOTE — Assessment & Plan Note (Signed)
Follows with Dr. Irene Limbo with oncology. Plan as follows: "Unclear if CLL progression is significant enough to begin treatment at this time. If pt still anemic despite adequate iron/b vitamin replacement and after healing from surgery this would be an indication to begin treatment."  - Follow up in February

## 2021-01-07 NOTE — Assessment & Plan Note (Signed)
Chronic. Asymptomatic. Warm and well perfused. Underwent right femoral endarterectomy with vein patch angioplasty followed by right femoral to below-knee popliteal artery bypass graft with [place] by vascular surgery.  - follow up with [facility] in Feb 2022 for ABI and duplex as recommended  - continue to encourage compression stockings and leg elevation

## 2021-01-07 NOTE — Assessment & Plan Note (Addendum)
Chronic, worsening.  - increase Jardiance to 25mg  QD - continue Metformin 500mg  QD (renally adjusted) - BMP in 2 weeks to monitor kidney function - Given wide range of BS reported by patient in phone message ([phone]), recommended patient to keep a log of blood sugars and follow up 1 month for closer monitoring - May consider starting Lantus to help improve sugar control vs GLP-1  - encouraged to schedule diabetic eye exam

## 2021-01-07 NOTE — Assessment & Plan Note (Signed)
Chronic. Well managed with Flexeril TID PRN and Tramadol 50mg  BID PRN. - discussed CNS effects and risk of sedation. Patient voiced understanding and agreement with continuation of medication. - Continue current medications

## 2021-01-07 NOTE — Assessment & Plan Note (Addendum)
Chronic. Endorses some worsening SOB with longer amounts of exertion. Denies chest pain. Denies wheezing or nighttime cough. Continues to be an everyday smoker.  - continue albuterol PRN, Breo daily, Singulair - follow up if continues to worsen - strongly encouraged smoking cessation

## 2021-01-07 NOTE — Assessment & Plan Note (Signed)
Chronic, well controlled. - continue Hydralazine 50mg  TID and Amlodipine 5mg  QD

## 2021-01-07 NOTE — Assessment & Plan Note (Signed)
Chronic. Well controlled with pepcid - continue current meds

## 2021-01-07 NOTE — Assessment & Plan Note (Signed)
Chronic. GFR ~36 based on last two BMP's. Diabetes worsening. Metformin renally dosed. BP well controlled.  - increase Jardiance 25mg  QD - avoid NSAIDs - follows with nephrology

## 2021-01-07 NOTE — Assessment & Plan Note (Signed)
Currently smokes 6 cigarettes a day. History of smoking 0.5 pack/day x 46 years (23 pack year history). Does qualify for lung cancer screen at this time. Patient was counseled on the risks of tobacco use and cessation strongly encouraged. - Low dose CT scan ordered

## 2021-01-07 NOTE — Assessment & Plan Note (Signed)
Chronic.  1+ pitting edema in ankle/mid shin. Improves with elevation and compression stocking. - continue elevation and compression stockings

## 2021-01-07 NOTE — Telephone Encounter (Signed)
Please have patient schedule diabetes follow up appointment in 1 month to evaluate blood sugars. Have her keep a log of her sugars every day and be sure to bring the log and her medications to the appointment.

## 2021-01-13 ENCOUNTER — Other Ambulatory Visit: Payer: Self-pay | Admitting: Family Medicine

## 2021-01-13 DIAGNOSIS — J449 Chronic obstructive pulmonary disease, unspecified: Secondary | ICD-10-CM

## 2021-01-13 NOTE — Telephone Encounter (Signed)
Called and scheduled patient

## 2021-01-16 ENCOUNTER — Other Ambulatory Visit: Payer: Self-pay | Admitting: Family Medicine

## 2021-01-16 ENCOUNTER — Ambulatory Visit (HOSPITAL_COMMUNITY): Payer: Medicare Other

## 2021-01-19 ENCOUNTER — Other Ambulatory Visit: Payer: Self-pay | Admitting: Family Medicine

## 2021-01-22 ENCOUNTER — Other Ambulatory Visit: Payer: Self-pay

## 2021-01-22 ENCOUNTER — Other Ambulatory Visit: Payer: Medicare Other

## 2021-01-22 DIAGNOSIS — E1151 Type 2 diabetes mellitus with diabetic peripheral angiopathy without gangrene: Secondary | ICD-10-CM

## 2021-01-26 ENCOUNTER — Ambulatory Visit (HOSPITAL_COMMUNITY)
Admission: RE | Admit: 2021-01-26 | Discharge: 2021-01-26 | Disposition: A | Discharge status: Home / Self Care | Payer: Medicare Other | Source: Ambulatory Visit | Attending: Family Medicine | Admitting: Family Medicine

## 2021-01-26 ENCOUNTER — Encounter (HOSPITAL_COMMUNITY): Payer: Self-pay

## 2021-01-26 ENCOUNTER — Other Ambulatory Visit: Payer: Self-pay

## 2021-01-26 ENCOUNTER — Other Ambulatory Visit: Payer: Self-pay | Admitting: Family Medicine

## 2021-01-26 DIAGNOSIS — R0600 Dyspnea, unspecified: Secondary | ICD-10-CM

## 2021-01-26 DIAGNOSIS — R0609 Other forms of dyspnea: Secondary | ICD-10-CM

## 2021-01-26 DIAGNOSIS — F17200 Nicotine dependence, unspecified, uncomplicated: Secondary | ICD-10-CM

## 2021-01-26 DIAGNOSIS — R918 Other nonspecific abnormal finding of lung field: Secondary | ICD-10-CM | POA: Diagnosis not present

## 2021-01-26 NOTE — Progress Notes (Signed)
   Subjective:   Patient ID: [name] [name]    DOB: [birth year], 75 y.o. female   MRN: [id number]  [name] [name] is a 75 y.o. female with a history of HFpEF, T2DM, HTN, PAD, PVD, venous insufficiency, COPD, GERD, CKD III, anxiety, chronic back pain, CLL, h/o pulmonary nodules with no recommended f/u, tobacco dependence here for diabetes follow up  Diabetes: Last three A1C's below. Has been taking Jardiance 10mg  QD and Metformin 500mg  QD (renally dosed). Her Jardiance was increased to 25mg  but she will be starting this tomorrow. Endorses compliance. Unsure what her CBGs are but note they are in the 100's. Denies hypoglycemia. BMP   Lab Results  Component Value Date   HGBA1C 8.0 (A) 01/06/2021   HGBA1C 7.4 (H) 09/24/2020   HGBA1C 6.7 07/10/2020   Review of Systems:  Per HPI.   Objective:   BP 113/65   Pulse 77   Ht 5\' 1"  (1.549 m)   Wt 142 lb 3.2 oz (64.5 kg)   SpO2 97%   BMI 26.87 kg/m  Vitals and nursing note reviewed.  General: pleasant elderly woman, sitting comfortably in exam chair, well nourished, well developed, in no acute distress with non-toxic appearance, uses rolling walker at baseline Resp: breathing comfortably on room air, speaking in full sentences MSK:  gait normal Neuro: Alert and oriented, speech normal  Assessment & Plan:   DM (diabetes mellitus), type 2 with peripheral vascular complications (HCC) Chronic. Current smoker. CKDIIIb with GFR 30. - Continue metformin 500mg  QD (renally adjusted) - Restart Jardiacne 10mg  QD, stop Jardiance 25mg  QD - Start Trulicity 0.75mg  q weekly - Follow up in 1 month to ensure adequate tolerance and understanding. - A1C in 3 months (April) - encouraged to follow up with nephrologist.  Chronic kidney disease (CKD), stage III (moderate) ([place]) CKDIIIb with GFR 30. Medications have been adjusted. Established with nephrologist - Encouraged to follow up for continued monitoring  Orders Placed This Encounter  Procedures   . Basic Metabolic Panel   Meds ordered this encounter  Medications  . Dulaglutide (TRULICITY) 5.70 VX/7.9TJ SOPN    Sig: Inject 0.75 mg into the skin once a week.    Dispense:  2 mL    Refill:  0  . empagliflozin (JARDIANCE) 10 MG TABS tablet    Sig: Take 1 tablet (10 mg total) by mouth daily.    Dispense:  90 tablet    Refill:  [place], DO PGY-3, [facility] 01/29/2021 1:02 PM

## 2021-01-27 ENCOUNTER — Encounter: Payer: Self-pay | Admitting: Family Medicine

## 2021-01-27 ENCOUNTER — Ambulatory Visit (INDEPENDENT_AMBULATORY_CARE_PROVIDER_SITE_OTHER): Payer: Medicare Other | Admitting: Family Medicine

## 2021-01-27 VITALS — BP 113/65 | HR 77 | Ht 61.0 in | Wt 142.2 lb

## 2021-01-27 DIAGNOSIS — E1151 Type 2 diabetes mellitus with diabetic peripheral angiopathy without gangrene: Secondary | ICD-10-CM

## 2021-01-27 DIAGNOSIS — N1832 Chronic kidney disease, stage 3b: Secondary | ICD-10-CM | POA: Diagnosis not present

## 2021-01-27 MED ORDER — TRULICITY 0.75 MG/0.5ML ~~LOC~~ SOAJ: 0.7500 mg | SUBCUTANEOUS | 0 refills | Status: DC

## 2021-01-27 NOTE — Patient Instructions (Signed)
It was a pleasure to see you today!  Thank you for choosing [facility] for your primary care.   Our plans for today were:  Continue your Metformin 500mg  daily and Jardiance 25mg  daily   To keep you healthy, please keep in mind the following health maintenance items that you are due for:   1. Diabetic foot exam  2. Diabetic eye exam  3. Tetanus  vaccine   You should return to our clinic in 2 months for diabetes follow up.   Best Wishes,   Mina Marble, DO

## 2021-01-28 LAB — BASIC METABOLIC PANEL
BUN/Creatinine Ratio: 7 — ABNORMAL LOW (ref 12–28)
BUN: 13 mg/dL (ref 8–27)
CO2: 29 mmol/L (ref 20–29)
Calcium: 9.5 mg/dL (ref 8.7–10.3)
Chloride: 103 mmol/L (ref 96–106)
Creatinine, Ser: 1.89 mg/dL — ABNORMAL HIGH (ref 0.57–1.00)
GFR calc Af Amer: 30 mL/min/{1.73_m2} — ABNORMAL LOW (ref >59)
GFR calc non Af Amer: 26 mL/min/{1.73_m2} — ABNORMAL LOW (ref >59)
Glucose: 174 mg/dL — ABNORMAL HIGH (ref 65–99)
Potassium: 4.6 mmol/L (ref 3.5–5.2)
Sodium: 144 mmol/L (ref 134–144)

## 2021-01-29 ENCOUNTER — Other Ambulatory Visit: Payer: Self-pay | Admitting: Family Medicine

## 2021-01-29 DIAGNOSIS — J449 Chronic obstructive pulmonary disease, unspecified: Secondary | ICD-10-CM

## 2021-01-29 MED ORDER — EMPAGLIFLOZIN 10 MG PO TABS: 10.0000 mg | ORAL_TABLET | Freq: Every day | ORAL | 3 refills | Status: DC

## 2021-01-29 MED ORDER — CYCLOBENZAPRINE HCL 10 MG PO TABS: 10.0000 mg | ORAL_TABLET | Freq: Three times a day (TID) | ORAL | 1 refills | Status: DC | PRN

## 2021-01-29 NOTE — Assessment & Plan Note (Addendum)
Chronic. Current smoker. CKDIIIb with GFR 30. - Continue metformin 500mg  QD (renally adjusted) - Restart Jardiacne 10mg  QD, stop Jardiance 25mg  QD - Start Trulicity 0.75mg  q weekly - Follow up in 1 month to ensure adequate tolerance and understanding. - A1C in 3 months (April) - encouraged to follow up with nephrologist.

## 2021-01-29 NOTE — Assessment & Plan Note (Signed)
CKDIIIb with GFR 30. Medications have been adjusted. Established with nephrologist - Encouraged to follow up for continued monitoring

## 2021-02-05 NOTE — Progress Notes (Signed)
HEMATOLOGY/ONCOLOGY CLINIC NOTE  Date of Service: 02/05/21    Patient Care Team: Danna Hefty, DO as PCP - General (Family Medicine) Sueanne Margarita, MD as PCP - Cardiology (Cardiology) Lazaro Arms, RN as [place] Management  CHIEF COMPLAINTS/PURPOSE OF CONSULTATION:  F/u for CLL   HISTORY OF PRESENTING ILLNESS:  [name] [name] is a wonderful 75 y.o. female smoker with history of CLL who has been referred to [place] by Dr Tarry Kos, Archie Endo, DO at [facility] for evaluation and management of elevated WBC/lymphocytes.   She is accompanied by her husband. She reported to ED on 11/27/2017 with cough and abdominal pain with COPD exacerbation. Her labs on this day showed WBC elevated at 62.5. She has not previously seen a hematologist. She reports a plan to quit smoking on the first day of the new year. She reports worsening decrease in balance and states she has a cane and walker that she uses as needed. She was taking Prednisone.  No weight loss/fevers/chills/night sweats/ palpable lumps or bumps.  On review of systems, pt reports back pain, change in balance, abdominal pain, weight loss and denies changes in BM, fever and any other accompanying symptoms.   INTERVAL HISTORY: [name] [name] is here for management and evaluation of her Chronic Leukocytic Leukemia. We are joined today by her husband Mr. Hilario. The patient's last visit with [place] was on 12/05/2020. The pt reports that she is doing well overall.  The pt reports that she is still experiencing pain from her surgery in October for right femoral endarterectomy. The pt notes that she has very bothersome muscle spasms last night on her left side. She said that the surgeon was planning on working on the other leg after the right leg healed from surgery. The pt notes that she was put on a weekly shot for her diabetes.  The pt notes that she has been experiencing black stools from taking her Iron pills  BID as prescribed from the last visit. The pt has received her COVID vaccines and booster, as well as the annual flu vaccine. The pt notes she wears compression sock and elevated her leg sleeping to alleviate her right leg swelling.  Lab results today 02/06/2021 of CBC w/diff and CMP is as follows: all values are WNL except for WBC of 212.1K, Hgb of 8.5, HCT of 30.6, MCv of 79.1, MCH of 22.0, MCHC of 27.8, RDW of 25.8, Neutro Abs of 14.8K, Lymphs Abs of 193.0 K, Monocytes Absolute of 4.2K, Glucose of 126, Creatinine of 1.71, Alkaline Phosphatase of 175, GFR est of 31. 02/06/2021 LDH of 445. 02/06/2021 Ferritin pending. 02/06/2021 Iron of 37 with a Sat Ratio of 9. 02/06/2021 Haptoglobin pending. 02/06/2021 Vitamin B12 pending.  On review of systems, pt reports black stools and denies bloody stools, decreased appetite, fevers, chills, night sweats, new lumps/bumps, unexplained weight loss, abdominal pain, and any other symptoms.  MEDICAL HISTORY:  Past Medical History:  Diagnosis Date  . Allergy    environmental  . Asthma   . CKD (chronic kidney disease)   . CLL (chronic lymphoblastic leukemia)   . Constipation 11/15/2011  . COPD (chronic obstructive pulmonary disease) ([place])   . Diabetes mellitus without complication ([place])   . GERD (gastroesophageal reflux disease)   . Headache    migraines  . Heart murmur   . Hyperlipidemia   . Hypertension   . PVD (peripheral vascular disease) ([place])   . Stroke [facility]) 1998  in 1998 due to tumor-right side    SURGICAL HISTORY: Past Surgical History:  Procedure Laterality Date  . ABDOMINAL AORTOGRAM W/LOWER EXTREMITY Bilateral 08/12/2020   Procedure: ABDOMINAL AORTOGRAM W/BILATERAL LOWER EXTREMITY RUNOFF;  Surgeon: Serafina Mitchell, MD;  Location: [facility];  Service: Cardiovascular;  Laterality: Bilateral;  . ABI  02/2012   ABI <0.65 BL 02/2012  . BRAIN MENINGIOMA EXCISION    . Bypass grafting of RLE for PAD claudication     . CARDIAC  CATHETERIZATION  04/09/208  . CESAREAN SECTION     1974, 78 ,80  . CHOLECYSTECTOMY, LAPAROSCOPIC    . COLONOSCOPY  2014   [place], [place]  . ENDARTERECTOMY FEMORAL Right 10/03/2020   Procedure: RIGHT FEMORAL ENDARTERECTOMY WITH VEIN PATCH ANGIOPLASTY;  Surgeon: Serafina Mitchell, MD;  Location: [facility];  Service: Vascular;  Laterality: Right;  . ESOPHAGEAL DILATION    . FEMORAL-POPLITEAL BYPASS GRAFT Right 10/03/2020   Procedure: RIGHT FEMORAL- BELOW KNEE POPLITEAL BYPASS USING GORE PROPATEN 6MM GRAFT;  Surgeon: Serafina Mitchell, MD;  Location: [facility];  Service: Vascular;  Laterality: Right;  . PATCH ANGIOPLASTY  10/03/2020   Procedure: VEIN PATCH ANGIOPLASTY;  Surgeon: Serafina Mitchell, MD;  Location: [facility] OR;  Service: Vascular;;    SOCIAL HISTORY: Social History   Socioeconomic History  . Marital status: Married    Spouse name: Not on file  . Number of children: 3  . Years of education: Not on file  . Highest education level: Not on file  Occupational History  . Occupation: DISABLED    Employer: DISABLED  Tobacco Use  . Smoking status: Current Every Day Smoker    Packs/day: 0.25    Years: 30.00    Pack years: 7.50    Types: Cigarettes    Start date: 01/01/1976  . Smokeless tobacco: Never Used  . Tobacco comment: ~ 3 cigarettes / day  Vaping Use  . Vaping Use: Never used  Substance and Sexual Activity  . Alcohol use: No    Alcohol/week: 0.0 standard drinks  . Drug use: No  . Sexual activity: Yes  Other Topics Concern  . Not on file  Social History Narrative   Health Care POA:    Emergency Contact: Tajia Szeliga [phone] (c)   End of Life Plan:    Who lives with you: Lives with husband   Any pets: none   Diet: Patient lacks financial resources for much food. Pt reports eating what is available.   Exercise: Patient does not have an exercise plan.   Seatbelts: Patient reports wearing seatbelt when in vehicle.    [name] Exposure/Protection:   Hobbies: Bowling, computer games,  Bingo      Has financial difficulties and transportation issues as she and her husband share transportation       Social Determinants of Radio broadcast assistant Strain: Not on file  Food Insecurity: Not on file  Transportation Needs: Not on file  Physical Activity: Not on file  Stress: Not on file  Social Connections: Not on file  Intimate Partner Violence: Not on file    FAMILY HISTORY: Family History  Problem Relation Age of Onset  . Heart disease Mother   . Asthma Mother   . Cancer Mother        uterine   . Depression Mother   . Heart attack Mother 25  . Hyperlipidemia Mother   . Hypertension Mother   . Stroke Mother   . Kidney disease Mother   .  Heart attack Sister   . Stroke Sister   . Depression Sister   . Diabetes Sister   . Hyperlipidemia Sister   . Depression Sister   . Diabetes Sister   . Hyperlipidemia Sister   . HIV/AIDS Sister   . Diabetes Brother   . Asthma Brother   . Hypertension Brother   . Cancer Maternal Aunt        lung  . Heart disease Maternal Grandmother   . Heart attack Maternal Grandmother   . Cancer Brother        colon  . HIV/AIDS Brother   . Colon cancer Neg Hx     ALLERGIES:  is allergic to no healthtouch food allergies, aspirin, and baclofen.  MEDICATIONS:  Current Outpatient Medications  Medication Sig Dispense Refill  . acetaminophen (TYLENOL) 500 MG tablet Take 500-1,000 mg by mouth every 6 (six) hours as needed for moderate pain.     Marland Kitchen albuterol (ACCUNEB) 0.63 MG/3ML nebulizer solution Inhale 1 vial via nebulizer every 6 hours for wheezing as needed (Patient taking differently: Take 1 ampule by nebulization every 6 (six) hours as needed for wheezing or shortness of breath. ) 75 mL 11  . albuterol (VENTOLIN HFA) 108 (90 Base) MCG/ACT inhaler Inhale 2 puffs by mouth every 6 hours as needed for wheezing or shortness of breath (Patient taking differently: Inhale 2 puffs into the lungs every 6 (six) hours as needed for  wheezing or shortness of breath. ) 8.5 g 11  . atorvastatin (LIPITOR) 80 MG tablet Take 1 tablet (80 mg total) by mouth daily. 90 tablet 3  . b complex vitamins capsule Take 1 capsule by mouth daily. 30 capsule 5  . Blood Glucose Monitoring Suppl (ONETOUCH VERIO) w/Device KIT 1 Device by Does not apply route daily. 1 kit 0  . BREO ELLIPTA 100-25 MCG/INH AEPB Inhale 1 puff by mouth every day (Patient taking differently: Inhale 1 puff into the lungs daily. ) 60 each 11  . clopidogrel (PLAVIX) 75 MG tablet Take 1 tablet by mouth every day 30 tablet 11  . cyclobenzaprine (FLEXERIL) 10 MG tablet Take 1 tablet (10 mg total) by mouth 3 (three) times daily as needed for muscle spasms. 90 tablet 1  . Dulaglutide (TRULICITY) 1.91 YN/8.2NF SOPN Inject 0.75 mg into the skin once a week. 2 mL 0  . DULoxetine (CYMBALTA) 60 MG capsule Take 1 capsule (60 mg total) by mouth daily. 90 capsule 3  . empagliflozin (JARDIANCE) 10 MG TABS tablet Take 1 tablet (10 mg total) by mouth daily. 90 tablet 3  . ergocalciferol (VITAMIN D2) 1.25 MG (50000 UT) capsule Take 50,000 Units by mouth every Monday.    . famotidine (PEPCID) 20 MG tablet Take 2 tablets by mouth every day (Patient taking differently: Take 40 mg by mouth daily. ) 60 tablet 11  . furosemide (LASIX) 20 MG tablet Take 1 tablet (20 mg total) by mouth daily. 30 tablet 3  . gabapentin (NEURONTIN) 300 MG capsule Take 1 capsule (300 mg total) by mouth 3 (three) times daily. X 1 week, then 600 mg BID- long term 600 mg 2x/day-  for nerve pain (Patient taking differently: Take 300 mg by mouth 3 (three) times daily. ) 360 capsule 3  . glucose blood (ONETOUCH VERIO) test strip Use to check blood sugar once a day 100 each 12  . hydrALAZINE (APRESOLINE) 50 MG tablet Take 50 mg by mouth 3 (three) times daily.    . iron polysaccharides (NIFEREX) 150  MG capsule Take 1 capsule (150 mg total) by mouth daily. 30 capsule 5  . Lancets (ONETOUCH ULTRASOFT) lancets Use to check blood  sugar once a day 100 each 12  . lidocaine (XYLOCAINE) 5 % ointment Apply 1 application topically as needed. Apply to R>L feet when burning up to 4x/day as needed 50 g 3  . montelukast (SINGULAIR) 10 MG tablet Take 1 tablet by mouth at bedtime (Patient taking differently: Take 10 mg by mouth at bedtime. ) 30 tablet 11  . nitroGLYCERIN (NITROSTAT) 0.4 MG SL tablet Dissolve 1 tablet under tongue every 5 minutes, up to 3 doses for chest pain (Patient taking differently: Place 0.4 mg under the tongue every 5 (five) minutes as needed for chest pain. up to 3 doses) 25 tablet 11  . traMADol (ULTRAM) 50 MG tablet Take 1 tablet by mouth twice daily as needed for moderate to severe pain 30 tablet 3   No current facility-administered medications for this visit.    REVIEW OF SYSTEMS:   10 Point review of Systems was done is negative except as noted above.  PHYSICAL EXAMINATION: ECOG FS:2 - Symptomatic, <50% confined to bed  There were no vitals filed for this visit. Wt Readings from Last 3 Encounters:  01/27/21 142 lb 3.2 oz (64.5 kg)  01/06/21 141 lb (64 kg)  12/05/20 146 lb 4.8 oz (66.4 kg)   There is no height or weight on file to calculate BMI.     Exam was given in a chair.   GENERAL:alert, in no acute distress and comfortable SKIN: no acute rashes, no significant lesions EYES: conjunctiva are pink and non-injected, sclera anicteric OROPHARYNX: MMM, no exudates, no oropharyngeal erythema or ulceration NECK: supple, no JVD LYMPH:  no palpable lymphadenopathy in the cervical, axillary or inguinal regions LUNGS: clear to auscultation b/l with normal respiratory effort HEART: regular rate & rhythm ABDOMEN:  normoactive bowel sounds , non tender, not distended. Extremity: no pedal edema PSYCH: alert & oriented x 3 with fluent speech NEURO: no focal motor/sensory deficits  LABORATORY DATA:  I have reviewed the data as listed  . CBC Latest Ref Rng & Units 12/05/2020 10/04/2020 10/03/2020   WBC 4.0 - 10.5 K/uL 180.8(HH) 123.3(HH) 116.3(HH)  Hemoglobin 12.0 - 15.0 g/dL 8.6(L) 8.9(L) 8.9(L)  Hematocrit 36.0 - 46.0 % 30.2(L) 31.5(L) 30.6(L)  Platelets 150 - 400 K/uL 256 195 199    . CMP Latest Ref Rng & Units 01/27/2021 01/22/2021 12/05/2020  Glucose 65 - 99 mg/dL 174(H) CANCELED 188(H)  BUN 8 - 27 mg/dL 13 CANCELED 15  Creatinine 0.57 - 1.00 mg/dL 1.89(H) CANCELED 1.94(H)  Sodium 134 - 144 mmol/L 144 CANCELED 141  Potassium 3.5 - 5.2 mmol/L 4.6 CANCELED 4.7  Chloride 96 - 106 mmol/L 103 CANCELED 102  CO2 20 - 29 mmol/L 29 CANCELED 27  Calcium 8.7 - 10.3 mg/dL 9.5 CANCELED 9.2  Total Protein 6.5 - 8.1 g/dL - - 7.0  Total Bilirubin 0.3 - 1.2 mg/dL - - 0.3  Alkaline Phos 38 - 126 U/L - - 222(H)  AST 15 - 41 U/L - - 18  ALT 0 - 44 U/L - - 16   .Marland Kitchen Lab Results  Component Value Date   IRON 37 (L) 02/06/2021   TIBC 424 02/06/2021   IRONPCTSAT 9 (L) 02/06/2021   (Iron and TIBC)  Lab Results  Component Value Date   FERRITIN 15 02/06/2021    Component     Latest Ref Rng & Units 02/06/2021  Vitamin B12     180 - 914 pg/mL 448  Haptoglobin     42 - 346 mg/dL 223   Lab Results  Component Value Date   LDH 445 (H) 02/06/2021        . Lab Results  Component Value Date   LDH 349 (H) 12/05/2020    RADIOGRAPHIC STUDIES: I have personally reviewed the radiological images as listed and agreed with the findings in the report. CT CHEST WO CONTRAST  Result Date: 01/27/2021 CLINICAL DATA:  Dyspnea on exertion.  History of leukemia. EXAM: CT CHEST WITHOUT CONTRAST TECHNIQUE: Multidetector CT imaging of the chest was performed following the standard protocol without IV contrast. COMPARISON:  03/25/2020 FINDINGS: Cardiovascular: Thoracic aortic atherosclerosis without aneurysm. Coronary atherosclerosis. Normal heart size. No pericardial effusion. Mediastinum/Nodes: Interval enlargement of bilateral axillary and subpectoral lymph nodes with the largest measuring 1.8 cm in  short axis on the left (series 2, image 23, previously 1.1 cm). Unchanged 1.2 cm subcutaneous low-density nodule anteriorly at the level of the thoracic inlet to the left of midline, suspected to be a sebaceous cyst. No enlarged mediastinal or hilar lymph nodes identified within limitations of noncontrast technique. Chronically dilated esophagus containing food debris. Unremarkable thyroid. Lungs/Pleura: No pleural effusion or pneumothorax. Mildly limited assessment of the lung parenchyma due to respiratory motion artifact with similar appearance of scattered small bilateral lung nodules measuring up to 4 mm. Mild centrilobular emphysema. Upper Abdomen: Status post cholecystectomy. Musculoskeletal: No acute osseous abnormality or suspicious osseous lesion. IMPRESSION: 1. Mild progression of bilateral axillary and subpectoral lymphadenopathy. 2. Unchanged small bilateral lung nodules. 3. Chronically dilated esophagus containing food debris. 4. Aortic Atherosclerosis (ICD10-I70.0) and Emphysema (ICD10-J43.9). Electronically Signed   By: Logan Bores M.D.   On: 01/27/2021 14:33   Surgical Pathology 12/13/2017   ASSESSMENT & PLAN:   KANYLA OMEARA is a wonderful 75 y.o. female with    1. Rai 1 Chronic Lymphocytic Leukemia  - Trisomy 12 mutation. No associated thrombocytopenia.  Mild anemia - likely from acute blood loss from significant epistaxis. No constitutional symptoms. Minimal LNaednopathy No splenomegaly  #2 .Lung nodules on CT in 12/2017. Rpt CT chest 08/09/2018 - stable  #3 Patient Active Problem List   Diagnosis Date Noted  . Peripheral arterial disease ([place]) 10/03/2020  . (HFpEF) heart failure with preserved ejection fraction ([place]) 06/11/2020  . Venous insufficiency (chronic) (peripheral) 06/22/2019  . Pulmonary nodules 01/04/2018  . Mass of left lower leg 01/04/2018  . Chronic kidney disease (CKD), stage III (moderate) ([place]) 06/04/2016  . Chronic back pain 10/02/2015  . DM  (diabetes mellitus), type 2 with peripheral vascular complications ([place]) 22/01/5426  . Chronic lymphocytic leukemia ([place]) 04/11/2009  . PERIPHERAL VASCULAR DISEASE 10/13/2007  . HYPERCHOLESTEROLEMIA 02/23/2007  . ANXIETY 02/23/2007  . TOBACCO DEPENDENCE 02/23/2007  . Essential hypertension 02/23/2007  . COPD (chronic obstructive pulmonary disease) ([place]) 02/23/2007  . GASTROESOPHAGEAL REFLUX, NO ESOPHAGITIS 02/23/2007   PLAN: -Discussed pt labwork today, 02/06/2021; WBC increasing, developing anemia after surgery, LDH elevated. Iron deficient, low sat ratio. -Advised pt we sent out ferritin, iron, haptoglobin to see if other reason for anemia.  -Advised pt we sent out Vitamin B12 and Iron labs to observe if pt is still iron and vitamin deficient. -Advised pt the CLL has started affecting her blood counts and we may need to begin treatment.  -Recommended pt receive IV Iron due to iron deficiency. The pt is agreeable. -Recommend pt decrease salt intake. -Discussed treatment options for  CLL-- Gazyva/Venetoclax -Evidence of CLL progression is potentially significant enough to begin treatment at this time. Will f/u after labs today. If iron labs are fine, then need to treat. If iron low, then need IV iron.  -Continue taking PO Iron and B-complex daily.  -Will get PET CT in 1 week. -Will schedule chemo-counseling. -Will see back in 3 weeks with labs to start [place].    FOLLOW UP: IV Venofer twice weekly x 4 doses ASAP PET/CT in 1 week Chemo-counseling for Gazyva/Venetoclax in 1 week Schedule to start Gazyva in 3 weeks with labs and MD visit  The total time spent in the appointment was 30 minutes and more than 50% was on counseling and direct patient cares.  All of the patient's questions were answered with apparent satisfaction. The patient knows to call the clinic with any problems, questions or concerns.    Sullivan Lone MD [place] AAHIVMS [facility] Hematology/Oncology Physician [facility]  (Office):       [phone] (Work cell):  [phone] (Fax):           [phone]  I, Reinaldo Raddle, am acting as scribe for Dr. Sullivan Lone, MD.

## 2021-02-06 ENCOUNTER — Inpatient Hospital Stay (HOSPITAL_BASED_OUTPATIENT_CLINIC_OR_DEPARTMENT_OTHER): Payer: Medicare Other | Admitting: Hematology

## 2021-02-06 ENCOUNTER — Telehealth: Payer: Self-pay | Admitting: *Deleted

## 2021-02-06 ENCOUNTER — Other Ambulatory Visit: Payer: Self-pay

## 2021-02-06 ENCOUNTER — Inpatient Hospital Stay: Payer: Medicare Other | Attending: Hematology

## 2021-02-06 VITALS — BP 112/57 | HR 78 | Temp 97.0°F | Resp 18 | Ht 61.0 in | Wt 143.8 lb

## 2021-02-06 DIAGNOSIS — C9110 Chronic lymphocytic leukemia of B-cell type not having achieved remission: Secondary | ICD-10-CM | POA: Diagnosis not present

## 2021-02-06 DIAGNOSIS — D649 Anemia, unspecified: Secondary | ICD-10-CM

## 2021-02-06 DIAGNOSIS — Z79899 Other long term (current) drug therapy: Secondary | ICD-10-CM | POA: Diagnosis not present

## 2021-02-06 DIAGNOSIS — F17210 Nicotine dependence, cigarettes, uncomplicated: Secondary | ICD-10-CM | POA: Diagnosis not present

## 2021-02-06 DIAGNOSIS — D500 Iron deficiency anemia secondary to blood loss (chronic): Secondary | ICD-10-CM | POA: Diagnosis not present

## 2021-02-06 DIAGNOSIS — R918 Other nonspecific abnormal finding of lung field: Secondary | ICD-10-CM | POA: Diagnosis not present

## 2021-02-06 DIAGNOSIS — E611 Iron deficiency: Secondary | ICD-10-CM | POA: Diagnosis not present

## 2021-02-06 DIAGNOSIS — E119 Type 2 diabetes mellitus without complications: Secondary | ICD-10-CM | POA: Insufficient documentation

## 2021-02-06 LAB — CMP (CANCER CENTER ONLY)
ALT: 11 U/L (ref 0–44)
AST: 19 U/L (ref 15–41)
Albumin: 3.8 g/dL (ref 3.5–5.0)
Alkaline Phosphatase: 175 U/L — ABNORMAL HIGH (ref 38–126)
Anion gap: 9 (ref 5–15)
BUN: 12 mg/dL (ref 8–23)
CO2: 27 mmol/L (ref 22–32)
Calcium: 9.2 mg/dL (ref 8.9–10.3)
Chloride: 103 mmol/L (ref 98–111)
Creatinine: 1.71 mg/dL — ABNORMAL HIGH (ref 0.44–1.00)
GFR, Estimated: 31 mL/min — ABNORMAL LOW (ref >60)
Glucose, Bld: 126 mg/dL — ABNORMAL HIGH (ref 70–99)
Potassium: 4 mmol/L (ref 3.5–5.1)
Sodium: 139 mmol/L (ref 135–145)
Total Bilirubin: 0.3 mg/dL (ref 0.3–1.2)
Total Protein: 6.6 g/dL (ref 6.5–8.1)

## 2021-02-06 LAB — CBC WITH DIFFERENTIAL/PLATELET
Abs Immature Granulocytes: 0 10*3/uL (ref 0.00–0.07)
Basophils Absolute: 0 10*3/uL (ref 0.0–0.1)
Basophils Relative: 0 %
Eosinophils Absolute: 0 10*3/uL (ref 0.0–0.5)
Eosinophils Relative: 0 %
HCT: 30.6 % — ABNORMAL LOW (ref 36.0–46.0)
Hemoglobin: 8.5 g/dL — ABNORMAL LOW (ref 12.0–15.0)
Lymphocytes Relative: 91 %
Lymphs Abs: 193 10*3/uL — ABNORMAL HIGH (ref 0.7–4.0)
MCH: 22 pg — ABNORMAL LOW (ref 26.0–34.0)
MCHC: 27.8 g/dL — ABNORMAL LOW (ref 30.0–36.0)
MCV: 79.1 fL — ABNORMAL LOW (ref 80.0–100.0)
Monocytes Absolute: 4.2 10*3/uL — ABNORMAL HIGH (ref 0.1–1.0)
Monocytes Relative: 2 %
Neutro Abs: 14.8 10*3/uL — ABNORMAL HIGH (ref 1.7–7.7)
Neutrophils Relative %: 7 %
Platelets: 202 10*3/uL (ref 150–400)
RBC: 3.87 MIL/uL (ref 3.87–5.11)
RDW: 25.8 % — ABNORMAL HIGH (ref 11.5–15.5)
WBC: 212.1 10*3/uL (ref 4.0–10.5)
nRBC: 0 % (ref 0.0–0.2)

## 2021-02-06 LAB — LACTATE DEHYDROGENASE: LDH: 445 U/L — ABNORMAL HIGH (ref 98–192)

## 2021-02-06 LAB — FERRITIN: Ferritin: 15 ng/mL (ref 11–307)

## 2021-02-06 LAB — IRON AND TIBC
Iron: 37 ug/dL — ABNORMAL LOW (ref 41–142)
Saturation Ratios: 9 % — ABNORMAL LOW (ref 21–57)
TIBC: 424 ug/dL (ref 236–444)
UIBC: 387 ug/dL — ABNORMAL HIGH (ref 120–384)

## 2021-02-06 LAB — VITAMIN B12: Vitamin B-12: 448 pg/mL (ref 180–914)

## 2021-02-06 NOTE — Telephone Encounter (Signed)
CRITICAL VALUE STICKER  CRITICAL VALUE: WBC 212  RECEIVER (on-site recipient of call): Georgina Pillion, RN  DATE & TIME NOTIFIED: 02/06/21; 1113  MESSENGER (representative from lab): Lelan Pons  MD NOTIFIED: Dr. Irene Limbo   TIME OF NOTIFICATION: 1115  RESPONSE: Information acknowledged

## 2021-02-07 LAB — HAPTOGLOBIN: Haptoglobin: 223 mg/dL (ref 42–346)

## 2021-02-08 ENCOUNTER — Other Ambulatory Visit: Payer: Self-pay | Admitting: Family Medicine

## 2021-02-09 ENCOUNTER — Telehealth: Payer: Self-pay | Admitting: Hematology

## 2021-02-09 NOTE — Telephone Encounter (Signed)
Scheduled per 02/11 los, patient has been called and notified regarding Iron transfusions appointments.

## 2021-02-11 ENCOUNTER — Telehealth: Payer: Self-pay

## 2021-02-11 ENCOUNTER — Other Ambulatory Visit: Payer: Medicare Other

## 2021-02-11 NOTE — Telephone Encounter (Signed)
Received phone call from [facility] at [facility] regarding patient. Allie reports that patient reports confusion with diabetic medication management. Requesting that nursing call patient to discuss medication management.   Attempted to contact patient. Left HIPAA compliant VM for patient to return call to office to discuss further.    Talbot Grumbling, RN

## 2021-02-12 ENCOUNTER — Other Ambulatory Visit: Payer: Self-pay

## 2021-02-12 ENCOUNTER — Inpatient Hospital Stay: Payer: Medicare Other

## 2021-02-12 VITALS — BP 119/56 | HR 67 | Temp 98.8°F | Resp 18

## 2021-02-12 DIAGNOSIS — Z79899 Other long term (current) drug therapy: Secondary | ICD-10-CM | POA: Diagnosis not present

## 2021-02-12 DIAGNOSIS — D649 Anemia, unspecified: Secondary | ICD-10-CM | POA: Diagnosis not present

## 2021-02-12 DIAGNOSIS — F17210 Nicotine dependence, cigarettes, uncomplicated: Secondary | ICD-10-CM | POA: Diagnosis not present

## 2021-02-12 DIAGNOSIS — D509 Iron deficiency anemia, unspecified: Secondary | ICD-10-CM | POA: Insufficient documentation

## 2021-02-12 DIAGNOSIS — E611 Iron deficiency: Secondary | ICD-10-CM | POA: Diagnosis not present

## 2021-02-12 DIAGNOSIS — D500 Iron deficiency anemia secondary to blood loss (chronic): Secondary | ICD-10-CM

## 2021-02-12 DIAGNOSIS — C9110 Chronic lymphocytic leukemia of B-cell type not having achieved remission: Secondary | ICD-10-CM | POA: Diagnosis not present

## 2021-02-12 DIAGNOSIS — R918 Other nonspecific abnormal finding of lung field: Secondary | ICD-10-CM | POA: Diagnosis not present

## 2021-02-12 DIAGNOSIS — E119 Type 2 diabetes mellitus without complications: Secondary | ICD-10-CM | POA: Diagnosis not present

## 2021-02-12 MED ORDER — ACETAMINOPHEN 325 MG PO TABS
ORAL_TABLET | ORAL | Status: AC
  Filled 2021-02-12: qty 2

## 2021-02-12 MED ORDER — SODIUM CHLORIDE 0.9 % IV SOLN
Freq: Once | INTRAVENOUS | Status: AC
  Filled 2021-02-12: qty 250

## 2021-02-12 MED ORDER — SODIUM CHLORIDE 0.9 % IV SOLN
200.0000 mg | Freq: Once | INTRAVENOUS | Status: AC
  Administered 2021-02-12: 200 mg via INTRAVENOUS
  Filled 2021-02-12: qty 200

## 2021-02-12 MED ORDER — LORATADINE 10 MG PO TABS
10.0000 mg | ORAL_TABLET | Freq: Once | ORAL | Status: AC
Start: 2021-02-12 — End: 2021-02-12
  Administered 2021-02-12: 10 mg via ORAL

## 2021-02-12 MED ORDER — LORATADINE 10 MG PO TABS
ORAL_TABLET | ORAL | Status: AC
  Filled 2021-02-12: qty 1

## 2021-02-12 MED ORDER — ACETAMINOPHEN 325 MG PO TABS
650.0000 mg | ORAL_TABLET | Freq: Once | ORAL | Status: AC
  Administered 2021-02-12: 650 mg via ORAL

## 2021-02-12 NOTE — Patient Instructions (Signed)

## 2021-02-13 ENCOUNTER — Other Ambulatory Visit: Payer: Medicare Other

## 2021-02-13 ENCOUNTER — Encounter: Payer: Self-pay | Admitting: Hematology

## 2021-02-13 ENCOUNTER — Other Ambulatory Visit: Payer: Self-pay | Admitting: Family Medicine

## 2021-02-13 ENCOUNTER — Inpatient Hospital Stay: Payer: Medicare Other

## 2021-02-13 ENCOUNTER — Other Ambulatory Visit: Payer: Self-pay

## 2021-02-13 VITALS — BP 111/56 | HR 70 | Temp 98.3°F | Resp 18

## 2021-02-13 DIAGNOSIS — F17210 Nicotine dependence, cigarettes, uncomplicated: Secondary | ICD-10-CM | POA: Diagnosis not present

## 2021-02-13 DIAGNOSIS — D500 Iron deficiency anemia secondary to blood loss (chronic): Secondary | ICD-10-CM

## 2021-02-13 DIAGNOSIS — E611 Iron deficiency: Secondary | ICD-10-CM | POA: Diagnosis not present

## 2021-02-13 DIAGNOSIS — D649 Anemia, unspecified: Secondary | ICD-10-CM | POA: Diagnosis not present

## 2021-02-13 DIAGNOSIS — E119 Type 2 diabetes mellitus without complications: Secondary | ICD-10-CM | POA: Diagnosis not present

## 2021-02-13 DIAGNOSIS — J449 Chronic obstructive pulmonary disease, unspecified: Secondary | ICD-10-CM

## 2021-02-13 DIAGNOSIS — C9110 Chronic lymphocytic leukemia of B-cell type not having achieved remission: Secondary | ICD-10-CM | POA: Diagnosis not present

## 2021-02-13 DIAGNOSIS — Z79899 Other long term (current) drug therapy: Secondary | ICD-10-CM | POA: Diagnosis not present

## 2021-02-13 DIAGNOSIS — R918 Other nonspecific abnormal finding of lung field: Secondary | ICD-10-CM | POA: Diagnosis not present

## 2021-02-13 MED ORDER — SODIUM CHLORIDE 0.9 % IV SOLN
Freq: Once | INTRAVENOUS | Status: AC
  Filled 2021-02-13: qty 250

## 2021-02-13 MED ORDER — ACETAMINOPHEN 325 MG PO TABS
650.0000 mg | ORAL_TABLET | Freq: Once | ORAL | Status: AC
  Administered 2021-02-13: 650 mg via ORAL

## 2021-02-13 MED ORDER — SODIUM CHLORIDE 0.9 % IV SOLN
200.0000 mg | Freq: Once | INTRAVENOUS | Status: AC
  Administered 2021-02-13: 200 mg via INTRAVENOUS
  Filled 2021-02-13: qty 10

## 2021-02-13 MED ORDER — LORATADINE 10 MG PO TABS
10.0000 mg | ORAL_TABLET | Freq: Once | ORAL | Status: AC
  Administered 2021-02-13: 10 mg via ORAL

## 2021-02-13 NOTE — Progress Notes (Signed)
Called pt to introduce myself as her Financial Resource Specialist, discuss copay assistance and the [employer] grant.  I left a msg requesting she return my call if she's interested in applying for the grants. 

## 2021-02-13 NOTE — Patient Instructions (Signed)
Iron Dextran injection °What is this medicine? °IRON DEXTRAN (AHY ern DEX tran) is an iron complex. Iron is used to make healthy red blood cells, which carry oxygen and nutrients through the body. This medicine is used to treat people who cannot take iron by mouth and have low levels of iron in the blood. °This medicine may be used for other purposes; ask your health care provider or pharmacist if you have questions. °COMMON BRAND NAME(S): Dexferrum, INFeD °What should I tell my health care provider before I take this medicine? °They need to know if you have any of these conditions: °· anemia not caused by low iron levels °· heart disease °· high levels of iron in the blood °· kidney disease °· liver disease °· an unusual or allergic reaction to iron, other medicines, foods, dyes, or preservatives °· pregnant or trying to get pregnant °· breast-feeding °How should I use this medicine? °This medicine is for injection into a vein or a muscle. It is given by a health care professional in a hospital or clinic setting. °Talk to your pediatrician regarding the use of this medicine in children. While this drug may be prescribed for children as young as 4 months old for selected conditions, precautions do apply. °Overdosage: If you think you have taken too much of this medicine contact a poison control center or emergency room at once. °NOTE: This medicine is only for you. Do not share this medicine with others. °What if I miss a dose? °It is important not to miss your dose. Call your doctor or health care professional if you are unable to keep an appointment. °What may interact with this medicine? °Do not take this medicine with any of the following medications: °· deferoxamine °· dimercaprol °· other iron products °This medicine may also interact with the following medications: °· chloramphenicol °· deferasirox °This list may not describe all possible interactions. Give your health care provider a list of all the  medicines, herbs, non-prescription drugs, or dietary supplements you use. Also tell them if you smoke, drink alcohol, or use illegal drugs. Some items may interact with your medicine. °What should I watch for while using this medicine? °Visit your doctor or health care professional regularly. Tell your doctor if your symptoms do not start to get better or if they get worse. You may need blood work done while you are taking this medicine. °You may need to follow a special diet. Talk to your doctor. Foods that contain iron include: whole grains/cereals, dried fruits, beans, or peas, leafy green vegetables, and organ meats (liver, kidney). °Long-term use of this medicine may increase your risk of some cancers. Talk to your doctor about how to limit your risk. °What side effects may I notice from receiving this medicine? °Side effects that you should report to your doctor or health care professional as soon as possible: °· allergic reactions like skin rash, itching or hives, swelling of the face, lips, or tongue °· blue lips, nails, or skin °· breathing problems °· changes in blood pressure °· chest pain °· confusion °· fast, irregular heartbeat °· feeling faint or lightheaded, falls °· fever or chills °· flushing, sweating, or hot feelings °· joint or muscle aches or pains °· pain, tingling, numbness in the hands or feet °· seizures °· unusually weak or tired °Side effects that usually do not require medical attention (report to your doctor or health care professional if they continue or are bothersome): °· change in taste (metallic taste) °·   diarrhea °· headache °· irritation at site where injected °· nausea, vomiting °· stomach upset °This list may not describe all possible side effects. Call your doctor for medical advice about side effects. You may report side effects to FDA at 1-800-FDA-1088. °Where should I keep my medicine? °This drug is given in a hospital or clinic and will not be stored at home. °NOTE: This  sheet is a summary. It may not cover all possible information. If you have questions about this medicine, talk to your doctor, pharmacist, or health care provider. °© 2021 Elsevier/Gold Standard (2008-04-30 16:59:50) ° °

## 2021-02-17 ENCOUNTER — Ambulatory Visit (INDEPENDENT_AMBULATORY_CARE_PROVIDER_SITE_OTHER): Payer: Medicare Other | Admitting: Cardiology

## 2021-02-17 ENCOUNTER — Encounter: Payer: Self-pay | Admitting: Cardiology

## 2021-02-17 ENCOUNTER — Other Ambulatory Visit: Payer: Self-pay

## 2021-02-17 VITALS — BP 122/50 | HR 75 | Ht 61.0 in | Wt 143.2 lb

## 2021-02-17 DIAGNOSIS — I739 Peripheral vascular disease, unspecified: Secondary | ICD-10-CM

## 2021-02-17 DIAGNOSIS — I10 Essential (primary) hypertension: Secondary | ICD-10-CM | POA: Diagnosis not present

## 2021-02-17 DIAGNOSIS — N1832 Chronic kidney disease, stage 3b: Secondary | ICD-10-CM | POA: Diagnosis not present

## 2021-02-17 DIAGNOSIS — E7800 Pure hypercholesterolemia, unspecified: Secondary | ICD-10-CM | POA: Diagnosis not present

## 2021-02-17 DIAGNOSIS — R079 Chest pain, unspecified: Secondary | ICD-10-CM

## 2021-02-17 DIAGNOSIS — I5032 Chronic diastolic (congestive) heart failure: Secondary | ICD-10-CM

## 2021-02-17 NOTE — Patient Instructions (Signed)
Medication Instructions:  Your physician recommends that you continue on your current medications as directed. Please refer to the Current Medication list given to you today.  *If you need a refill on your cardiac medications before your next appointment, please call your pharmacy*   Lab Work: None If you have labs (blood work) drawn today and your tests are completely normal, you will receive your results only by: Marland Kitchen MyChart Message (if you have MyChart) OR . A paper copy in the mail If you have any lab test that is abnormal or we need to change your treatment, we will call you to review the results.   Testing/Procedures: None   Follow-Up: At [facility], you and your health needs are our priority.  As part of our continuing mission to provide you with exceptional heart care, we have created designated Provider Care Teams.  These Care Teams include your primary Cardiologist (physician) and Advanced Practice Providers (APPs -  Physician Assistants and Nurse Practitioners) who all work together to provide you with the care you need, when you need it.      Your next appointment:   6 month(s)  The format for your next appointment:   In Person  Provider:   You may see Fransico Him, MD or one of the following Advanced Practice Providers on your designated Care Team:    Melina Copa, PA-C  Ermalinda Barrios, PA-C

## 2021-02-17 NOTE — Progress Notes (Signed)
Cardiology Consult Note    Date:  02/17/2021   ID:  TENNYSON KALLEN, DOB 12-18-1946, MRN [id number]  PCP:  Danna Hefty, DO  Cardiologist:  Fransico Him, MD   Chief Complaint  Patient presents with  . Congestive Heart Failure  . Hypertension  . Chest Pain    History of Present Illness:  [name] [name] is a 75 y.o. female  with a hx of CLL, asthma, chronic diastolic CHF, DM2, GERD, HTN, HLD, chronic venous insufficiency, CKD stage 3 and PVD.  She sees Dr. Arita Miss for PVD with LE dopplers showing bilateral SFA occlusions s/p right femoral endarterectomy with patch angioplasty.    She is here today for followup and is doing well.  She has chronic DOE which is very stable.  She also has chronic RLE edema since her PVD surgery which is controlled on diuretics and compression hose.  She denies any chest pain or pressure, PND, orthopnea,  dizziness, palpitations or syncope. She is compliant with her meds and is tolerating meds with no SE.    Past Medical History:  Diagnosis Date  . Allergy    environmental  . Asthma   . CKD (chronic kidney disease)   . CLL (chronic lymphoblastic leukemia)   . Constipation 11/15/2011  . COPD (chronic obstructive pulmonary disease) ([place])   . Diabetes mellitus without complication ([place])   . GERD (gastroesophageal reflux disease)   . Headache    migraines  . Heart murmur   . Hyperlipidemia   . Hypertension   . PVD (peripheral vascular disease) ([place])   . Stroke [facility]) 1998   in 1998 due to tumor-right side    Past Surgical History:  Procedure Laterality Date  . ABDOMINAL AORTOGRAM W/LOWER EXTREMITY Bilateral 08/12/2020   Procedure: ABDOMINAL AORTOGRAM W/BILATERAL LOWER EXTREMITY RUNOFF;  Surgeon: Serafina Mitchell, MD;  Location: [place] [facility];  Service: Cardiovascular;  Laterality: Bilateral;  . ABI  02/2012   ABI <0.65 BL 02/2012  . BRAIN MENINGIOMA EXCISION    . Bypass grafting of RLE for PAD claudication     . CARDIAC  CATHETERIZATION  04/09/208  . CESAREAN SECTION     1974, 78 ,80  . CHOLECYSTECTOMY, LAPAROSCOPIC    . COLONOSCOPY  2014   [place], [place]  . ENDARTERECTOMY FEMORAL Right 10/03/2020   Procedure: RIGHT FEMORAL ENDARTERECTOMY WITH VEIN PATCH ANGIOPLASTY;  Surgeon: Serafina Mitchell, MD;  Location: [facility] OR;  Service: Vascular;  Laterality: Right;  . ESOPHAGEAL DILATION    . FEMORAL-POPLITEAL BYPASS GRAFT Right 10/03/2020   Procedure: RIGHT FEMORAL- BELOW KNEE POPLITEAL BYPASS USING GORE PROPATEN 6MM GRAFT;  Surgeon: Serafina Mitchell, MD;  Location: [place];  Service: Vascular;  Laterality: Right;  . PATCH ANGIOPLASTY  10/03/2020   Procedure: VEIN PATCH ANGIOPLASTY;  Surgeon: Serafina Mitchell, MD;  Location: [facility] OR;  Service: Vascular;;    Current Medications: Current Meds  Medication Sig  . acetaminophen (TYLENOL) 500 MG tablet Take 500-1,000 mg by mouth every 6 (six) hours as needed for moderate pain.   Marland Kitchen albuterol (ACCUNEB) 0.63 MG/3ML nebulizer solution Inhale 1 vial via nebulizer every 6 hours for wheezing as needed (Patient taking differently: Take 1 ampule by nebulization every 6 (six) hours as needed for wheezing or shortness of breath.)  . albuterol (VENTOLIN HFA) 108 (90 Base) MCG/ACT inhaler Inhale 2 puffs by mouth every 6 hours as needed for wheezing or shortness of breath (Patient taking differently: Inhale 2 puffs into the lungs  every 6 (six) hours as needed for wheezing or shortness of breath.)  . Alcohol Swabs (ALCOHOL PADS) 70 % PADS SMARTSIG:Pledget(s) Topical Daily  . amLODipine (NORVASC) 5 MG tablet Take 5 mg by mouth daily.  Marland Kitchen atorvastatin (LIPITOR) 80 MG tablet Take 1 tablet (80 mg total) by mouth daily.  Marland Kitchen b complex vitamins capsule Take 1 capsule by mouth daily.  . Blood Glucose Calibration (OT ULTRA/FASTTK CNTRL SOLN) SOLN See admin instructions.  . Blood Glucose Monitoring Suppl (ONETOUCH VERIO) w/Device KIT 1 Device by Does not apply route daily.  Marland Kitchen BREO ELLIPTA 100-25 MCG/INH  AEPB Inhale 1 puff by mouth every day (Patient taking differently: Inhale 1 puff into the lungs daily.)  . clopidogrel (PLAVIX) 75 MG tablet Take 1 tablet by mouth every day  . cyclobenzaprine (FLEXERIL) 10 MG tablet Take 1 tablet by mouth 3 times a day as needed for muscle spasms.  . diclofenac Sodium (VOLTAREN) 1 % GEL Apply 2 grams topically four times a day  . Dulaglutide (TRULICITY) 6.96 VE/9.3YB SOPN Inject 0.75 mg into the skin once a week.  . DULoxetine (CYMBALTA) 60 MG capsule Take 1 capsule (60 mg total) by mouth daily.  . ergocalciferol (VITAMIN D2) 1.25 MG (50000 UT) capsule Take 50,000 Units by mouth every Monday.  . famotidine (PEPCID) 20 MG tablet Take 2 tablets by mouth every day (Patient taking differently: Take 40 mg by mouth daily.)  . furosemide (LASIX) 20 MG tablet Take 1 tablet (20 mg total) by mouth daily.  Marland Kitchen gabapentin (NEURONTIN) 300 MG capsule Take 1 capsule (300 mg total) by mouth 3 (three) times daily. X 1 week, then 600 mg BID- long term 600 mg 2x/day-  for nerve pain (Patient taking differently: Take 300 mg by mouth 3 (three) times daily.)  . glucose blood (ONETOUCH VERIO) test strip Use to check blood sugar once a day  . hydrALAZINE (APRESOLINE) 50 MG tablet Take 50 mg by mouth 3 (three) times daily.  . iron polysaccharides (NIFEREX) 150 MG capsule Take 1 capsule (150 mg total) by mouth daily.  Elmore Guise Devices (EASY MINI EJECT LANCING DEVICE) MISC daily.  . Lancets (ONETOUCH ULTRASOFT) lancets Use to check blood sugar once a day  . lidocaine (XYLOCAINE) 5 % ointment Apply 1 application topically as needed. Apply to R>L feet when burning up to 4x/day as needed  . metFORMIN (GLUCOPHAGE) 500 MG tablet Take 500 mg by mouth daily.  . montelukast (SINGULAIR) 10 MG tablet Take 1 tablet by mouth at bedtime (Patient taking differently: Take 10 mg by mouth at bedtime.)  . nitroGLYCERIN (NITROSTAT) 0.4 MG SL tablet Dissolve 1 tablet under tongue every 5 minutes, up to 3 doses  for chest pain (Patient taking differently: Place 0.4 mg under the tongue every 5 (five) minutes as needed for chest pain. up to 3 doses)  . traMADol (ULTRAM) 50 MG tablet Take 1 tablet by mouth twice daily as needed for moderate to severe pain    Allergies:   No healthtouch food allergies, Aspirin, and Baclofen   Social History   Socioeconomic History  . Marital status: Married    Spouse name: Not on file  . Number of children: 3  . Years of education: Not on file  . Highest education level: Not on file  Occupational History  . Occupation: DISABLED    Employer: DISABLED  Tobacco Use  . Smoking status: Current Every Day Smoker    Packs/day: 0.25    Years: 30.00  Pack years: 7.50    Types: Cigarettes    Start date: 01/01/1976  . Smokeless tobacco: Never Used  . Tobacco comment: ~ 3 cigarettes / day  Vaping Use  . Vaping Use: Never used  Substance and Sexual Activity  . Alcohol use: No    Alcohol/week: 0.0 standard drinks  . Drug use: No  . Sexual activity: Yes  Other Topics Concern  . Not on file  Social History Narrative   Health Care POA:    Emergency Contact: Valine Drozdowski [phone] (c)   End of Life Plan:    Who lives with you: Lives with husband   Any pets: none   Diet: Patient lacks financial resources for much food. Pt reports eating what is available.   Exercise: Patient does not have an exercise plan.   Seatbelts: Patient reports wearing seatbelt when in vehicle.    [name] Exposure/Protection:   Hobbies: Bowling, computer games, Bingo      Has financial difficulties and transportation issues as she and her husband share transportation       Social Determinants of Radio broadcast assistant Strain: Not on file  Food Insecurity: Not on file  Transportation Needs: Not on file  Physical Activity: Not on file  Stress: Not on file  Social Connections: Not on file     Family History:  The patient's family history includes Asthma in her brother and mother;  Cancer in her brother, maternal aunt, and mother; Depression in her mother, sister, and sister; Diabetes in her brother, sister, and sister; HIV/AIDS in her brother and sister; Heart attack in her maternal grandmother and sister; Heart attack (age of onset: 39) in her mother; Heart disease in her maternal grandmother and mother; Hyperlipidemia in her mother, sister, and sister; Hypertension in her brother and mother; Kidney disease in her mother; Stroke in her mother and sister.   ROS:   Please see the history of present illness.    ROS All other systems reviewed and are negative.  No flowsheet data found.     PHYSICAL EXAM:   VS:  BP (!) 122/50   Pulse 75   Ht '5\' 1"'  (1.549 m)   Wt 143 lb 3.2 oz (65 kg)   SpO2 90%   BMI 27.06 kg/m    GEN: Well nourished, well developed in no acute distress HEENT: Normal NECK: No JVD; No carotid bruits LYMPHATICS: No lymphadenopathy CARDIAC:RRR, no murmurs, rubs, gallops RESPIRATORY:  Clear to auscultation without rales, wheezing or rhonchi  ABDOMEN: Soft, non-tender, non-distended MUSCULOSKELETAL:  1+ RLE edema; No deformity  SKIN: Warm and dry NEUROLOGIC:  Alert and oriented x 3 PSYCHIATRIC:  Normal affect    Wt Readings from Last 3 Encounters:  02/17/21 143 lb 3.2 oz (65 kg)  02/06/21 143 lb 12.8 oz (65.2 kg)  01/27/21 142 lb 3.2 oz (64.5 kg)      Studies/Labs Reviewed:   EKG:  EKG is not ordered today.    Recent Labs: 06/19/2020: B Natriuretic Peptide 29.2 02/06/2021: ALT 11; BUN 12; Creatinine 1.71; Hemoglobin 8.5; Platelets 202; Potassium 4.0; Sodium 139   Lipid Panel    Component Value Date/Time   CHOL 103 10/04/2020 0114   CHOL 159 09/26/2020 0925   TRIG 151 (H) 10/04/2020 0114   HDL 24 (L) 10/04/2020 0114   HDL 35 (L) 09/26/2020 0925   CHOLHDL 4.3 10/04/2020 0114   VLDL 30 10/04/2020 0114   LDLCALC 49 10/04/2020 0114   LDLCALC 91 09/26/2020 0925  LDLDIRECT 62 09/24/2010 2055    Additional studies/ records that were  reviewed today include:  OV notes and labs from [place]:    1. Chronic heart failure with preserved ejection fraction ([place])   2. Essential hypertension   3. HYPERCHOLESTEROLEMIA   4. PERIPHERAL VASCULAR DISEASE   5. Stage 3b chronic kidney disease ([facility])   6. Chest pain of uncertain etiology      PLAN:  In order of problems listed above:  1.  Chronic diastolic CHF -she appears euvolemic on exam today -her last echo 5/2021showed hyperdynamic LVF with EF 70-75% and moderate LVH and increased filling pressures -continue Lasix 35m daily  2.  HTN -Bp controlled on exam today -continue amlodipine 559mdaily and Hydralazine 5020mID  3.  HLD -LDL goal < 70 due to PVD -LDL was 49 in Oct 2021 -continue Atorvastatin 35m23mily   4.  PVD -followed by Dr. BrahArita Missh Vascular surgery -US sKoreawed occlusion of the bilateral SFA occlusions -continue Plavix and statin  5.  CKD stage 3b -SCr 1.71 02/06/2021 -followed by PCP  6.  CHest pain  -now resolved -Lexiscan myoview showed no ischemia  Medication Adjustments/Labs and Tests Ordered: Current medicines are reviewed at length with the patient today.  Concerns regarding medicines are outlined above.  Medication changes, Labs and Tests ordered today are listed in the Patient Instructions below.  There are no Patient Instructions on file for this visit.   Signed, TracFransico Him  02/17/2021 8:08 AM    [place] [place]  [phone]: [phone]: [phone]

## 2021-02-19 ENCOUNTER — Other Ambulatory Visit: Payer: Self-pay | Admitting: Hematology

## 2021-02-19 ENCOUNTER — Inpatient Hospital Stay: Payer: Medicare Other

## 2021-02-19 ENCOUNTER — Other Ambulatory Visit: Payer: Self-pay

## 2021-02-19 VITALS — BP 130/64 | HR 71 | Temp 99.0°F | Resp 16

## 2021-02-19 DIAGNOSIS — Z7189 Other specified counseling: Secondary | ICD-10-CM | POA: Insufficient documentation

## 2021-02-19 DIAGNOSIS — Z79899 Other long term (current) drug therapy: Secondary | ICD-10-CM | POA: Diagnosis not present

## 2021-02-19 DIAGNOSIS — C9110 Chronic lymphocytic leukemia of B-cell type not having achieved remission: Secondary | ICD-10-CM | POA: Diagnosis not present

## 2021-02-19 DIAGNOSIS — D500 Iron deficiency anemia secondary to blood loss (chronic): Secondary | ICD-10-CM

## 2021-02-19 DIAGNOSIS — R918 Other nonspecific abnormal finding of lung field: Secondary | ICD-10-CM | POA: Diagnosis not present

## 2021-02-19 DIAGNOSIS — E119 Type 2 diabetes mellitus without complications: Secondary | ICD-10-CM | POA: Diagnosis not present

## 2021-02-19 DIAGNOSIS — F17210 Nicotine dependence, cigarettes, uncomplicated: Secondary | ICD-10-CM | POA: Diagnosis not present

## 2021-02-19 DIAGNOSIS — D649 Anemia, unspecified: Secondary | ICD-10-CM | POA: Diagnosis not present

## 2021-02-19 DIAGNOSIS — E611 Iron deficiency: Secondary | ICD-10-CM | POA: Diagnosis not present

## 2021-02-19 MED ORDER — ONDANSETRON HCL 8 MG PO TABS: 8.0000 mg | ORAL_TABLET | Freq: Two times a day (BID) | ORAL | 1 refills | Status: DC | PRN

## 2021-02-19 MED ORDER — ALLOPURINOL 300 MG PO TABS: 150.0000 mg | ORAL_TABLET | Freq: Every day | ORAL | 0 refills | Status: DC

## 2021-02-19 MED ORDER — PROCHLORPERAZINE MALEATE 10 MG PO TABS: 10.0000 mg | ORAL_TABLET | Freq: Four times a day (QID) | ORAL | 1 refills | Status: DC | PRN

## 2021-02-19 MED ORDER — SODIUM CHLORIDE 0.9 % IV SOLN
200.0000 mg | Freq: Once | INTRAVENOUS | Status: AC
  Administered 2021-02-19: 200 mg via INTRAVENOUS
  Filled 2021-02-19: qty 200

## 2021-02-19 MED ORDER — SODIUM CHLORIDE 0.9 % IV SOLN
Freq: Once | INTRAVENOUS | Status: AC
  Filled 2021-02-19: qty 250

## 2021-02-19 NOTE — Patient Instructions (Signed)

## 2021-02-19 NOTE — Progress Notes (Signed)
START ON PATHWAY REGIMEN - Lymphoma and CLL     Cycle 1: A cycle is 28 days:     Venetoclax      Obinutuzumab      Obinutuzumab      Obinutuzumab    Cycle 2: A cycle is 28 days:     Venetoclax      Venetoclax      Venetoclax      Venetoclax      Obinutuzumab    Cycles 3 through 6: A cycle is 28 days:     Venetoclax      Obinutuzumab    Cycles 7 through 12: A cycle is 28 days:     Venetoclax   **Always confirm dose/schedule in your pharmacy ordering system**  Patient Characteristics: Chronic Lymphocytic Leukemia (CLL), Treatment Indicated, First Line, 17p del(-) and No ATM Deletion and TP53 Mutation Negative, Age ? 42 or Frail (Any Age) Disease Type: Chronic Lymphocytic Leukemia (CLL) Disease Type: Not Applicable Disease Type: Not Applicable Treatment Indicated<= Treatment Indicated Line of Therapy: First Line ATM (11q22) Deletion Status: No Deletion 17p Deletion Status: Negative TP53 Mutation Status: Negative Patient Age: ? 71 Patient Condition: Frail Patient Intent of Therapy: Non-Curative / Palliative Intent, Discussed with Patient

## 2021-02-20 ENCOUNTER — Inpatient Hospital Stay: Payer: Medicare Other

## 2021-02-20 ENCOUNTER — Telehealth: Payer: Self-pay | Admitting: Hematology

## 2021-02-20 VITALS — BP 138/60 | HR 72 | Temp 98.1°F | Resp 18

## 2021-02-20 DIAGNOSIS — C9110 Chronic lymphocytic leukemia of B-cell type not having achieved remission: Secondary | ICD-10-CM | POA: Diagnosis not present

## 2021-02-20 DIAGNOSIS — D500 Iron deficiency anemia secondary to blood loss (chronic): Secondary | ICD-10-CM

## 2021-02-20 DIAGNOSIS — E611 Iron deficiency: Secondary | ICD-10-CM | POA: Diagnosis not present

## 2021-02-20 DIAGNOSIS — F17210 Nicotine dependence, cigarettes, uncomplicated: Secondary | ICD-10-CM | POA: Diagnosis not present

## 2021-02-20 DIAGNOSIS — D649 Anemia, unspecified: Secondary | ICD-10-CM | POA: Diagnosis not present

## 2021-02-20 DIAGNOSIS — R918 Other nonspecific abnormal finding of lung field: Secondary | ICD-10-CM | POA: Diagnosis not present

## 2021-02-20 DIAGNOSIS — E119 Type 2 diabetes mellitus without complications: Secondary | ICD-10-CM | POA: Diagnosis not present

## 2021-02-20 DIAGNOSIS — Z79899 Other long term (current) drug therapy: Secondary | ICD-10-CM | POA: Diagnosis not present

## 2021-02-20 MED ORDER — LORATADINE 10 MG PO TABS
ORAL_TABLET | ORAL | Status: AC
  Filled 2021-02-20: qty 1

## 2021-02-20 MED ORDER — SODIUM CHLORIDE 0.9 % IV SOLN
200.0000 mg | Freq: Once | INTRAVENOUS | Status: AC
  Administered 2021-02-20: 200 mg via INTRAVENOUS
  Filled 2021-02-20: qty 200

## 2021-02-20 MED ORDER — ACETAMINOPHEN 325 MG PO TABS
ORAL_TABLET | ORAL | Status: AC
  Filled 2021-02-20: qty 2

## 2021-02-20 MED ORDER — LORATADINE 10 MG PO TABS
10.0000 mg | ORAL_TABLET | Freq: Once | ORAL | Status: AC
  Administered 2021-02-20: 10 mg via ORAL

## 2021-02-20 MED ORDER — ACETAMINOPHEN 325 MG PO TABS
650.0000 mg | ORAL_TABLET | Freq: Once | ORAL | Status: AC
  Administered 2021-02-20: 650 mg via ORAL

## 2021-02-20 MED ORDER — SODIUM CHLORIDE 0.9 % IV SOLN
Freq: Once | INTRAVENOUS | Status: AC
  Filled 2021-02-20: qty 250

## 2021-02-20 NOTE — Patient Instructions (Signed)
Iron Dextran injection °What is this medicine? °IRON DEXTRAN (AHY ern DEX tran) is an iron complex. Iron is used to make healthy red blood cells, which carry oxygen and nutrients through the body. This medicine is used to treat people who cannot take iron by mouth and have low levels of iron in the blood. °This medicine may be used for other purposes; ask your health care provider or pharmacist if you have questions. °COMMON BRAND NAME(S): Dexferrum, INFeD °What should I tell my health care provider before I take this medicine? °They need to know if you have any of these conditions: °· anemia not caused by low iron levels °· heart disease °· high levels of iron in the blood °· kidney disease °· liver disease °· an unusual or allergic reaction to iron, other medicines, foods, dyes, or preservatives °· pregnant or trying to get pregnant °· breast-feeding °How should I use this medicine? °This medicine is for injection into a vein or a muscle. It is given by a health care professional in a hospital or clinic setting. °Talk to your pediatrician regarding the use of this medicine in children. While this drug may be prescribed for children as young as 4 months old for selected conditions, precautions do apply. °Overdosage: If you think you have taken too much of this medicine contact a poison control center or emergency room at once. °NOTE: This medicine is only for you. Do not share this medicine with others. °What if I miss a dose? °It is important not to miss your dose. Call your doctor or health care professional if you are unable to keep an appointment. °What may interact with this medicine? °Do not take this medicine with any of the following medications: °· deferoxamine °· dimercaprol °· other iron products °This medicine may also interact with the following medications: °· chloramphenicol °· deferasirox °This list may not describe all possible interactions. Give your health care provider a list of all the  medicines, herbs, non-prescription drugs, or dietary supplements you use. Also tell them if you smoke, drink alcohol, or use illegal drugs. Some items may interact with your medicine. °What should I watch for while using this medicine? °Visit your doctor or health care professional regularly. Tell your doctor if your symptoms do not start to get better or if they get worse. You may need blood work done while you are taking this medicine. °You may need to follow a special diet. Talk to your doctor. Foods that contain iron include: whole grains/cereals, dried fruits, beans, or peas, leafy green vegetables, and organ meats (liver, kidney). °Long-term use of this medicine may increase your risk of some cancers. Talk to your doctor about how to limit your risk. °What side effects may I notice from receiving this medicine? °Side effects that you should report to your doctor or health care professional as soon as possible: °· allergic reactions like skin rash, itching or hives, swelling of the face, lips, or tongue °· blue lips, nails, or skin °· breathing problems °· changes in blood pressure °· chest pain °· confusion °· fast, irregular heartbeat °· feeling faint or lightheaded, falls °· fever or chills °· flushing, sweating, or hot feelings °· joint or muscle aches or pains °· pain, tingling, numbness in the hands or feet °· seizures °· unusually weak or tired °Side effects that usually do not require medical attention (report to your doctor or health care professional if they continue or are bothersome): °· change in taste (metallic taste) °·   diarrhea °· headache °· irritation at site where injected °· nausea, vomiting °· stomach upset °This list may not describe all possible side effects. Call your doctor for medical advice about side effects. You may report side effects to FDA at 1-800-FDA-1088. °Where should I keep my medicine? °This drug is given in a hospital or clinic and will not be stored at home. °NOTE: This  sheet is a summary. It may not cover all possible information. If you have questions about this medicine, talk to your doctor, pharmacist, or health care provider. °© 2021 Elsevier/Gold Standard (2008-04-30 16:59:50) ° °

## 2021-02-20 NOTE — Telephone Encounter (Signed)
Scheduled per provider and nurses orders, patient has been called and notified of upcoming March appointments.

## 2021-02-23 ENCOUNTER — Encounter: Payer: Self-pay | Admitting: Surgery

## 2021-02-23 ENCOUNTER — Ambulatory Visit (INDEPENDENT_AMBULATORY_CARE_PROVIDER_SITE_OTHER)
Admission: RE | Admit: 2021-02-23 | Discharge: 2021-02-23 | Disposition: A | Discharge status: Home / Self Care | Payer: Medicare Other | Source: Ambulatory Visit | Attending: Surgery | Admitting: Surgery

## 2021-02-23 ENCOUNTER — Ambulatory Visit (HOSPITAL_COMMUNITY)
Admission: RE | Admit: 2021-02-23 | Discharge: 2021-02-23 | Disposition: A | Discharge status: Home / Self Care | Payer: Medicare Other | Source: Ambulatory Visit | Attending: Surgery | Admitting: Surgery

## 2021-02-23 ENCOUNTER — Ambulatory Visit: Payer: Medicare Other | Admitting: Surgery

## 2021-02-23 ENCOUNTER — Other Ambulatory Visit: Payer: Self-pay

## 2021-02-23 VITALS — BP 143/74 | HR 73 | Temp 97.8°F | Resp 20 | Ht 61.0 in | Wt 141.0 lb

## 2021-02-23 DIAGNOSIS — I70213 Atherosclerosis of native arteries of extremities with intermittent claudication, bilateral legs: Secondary | ICD-10-CM | POA: Insufficient documentation

## 2021-02-23 DIAGNOSIS — I70211 Atherosclerosis of native arteries of extremities with intermittent claudication, right leg: Secondary | ICD-10-CM

## 2021-02-23 NOTE — Progress Notes (Signed)
[facility] [place]  Patient name: [name] [name] MRN: [id number] DOB: [birth year] Sex: female   REASON FOR VISIT:    Follow up  [place]:   [name] [name] a 74y.o.female, who isback for follow up.In 2009 I took her for angiogram for claudication and she was found to have an 18 cm right superficial femoral artery occlusion which was successfully recanalized and stented. She then moved to [place]. She ultimately had a intervention in her left leg in [place] for claudication.I last saw her in 2019. At that time her symptoms were tolerable. She had tried cilostazol but did not have any benefit. She did not have any open wounds and so I elected to continue to monitor her.She has had a worsening of her symptoms. This is bordering on rest pain. On 10/04/2020 she underwent a right femoral endarterectomy with vein patch angioplasty followed by right femoral to below-knee popliteal artery bypass graft with Gore-Tex.  She is back for follow up.  She wears compression socks for right leg edema   The patient suffers from hypercholesterolemia which is managed with a statin. She is medically managed for hypertension. She is a current smoker. She does suffer from COPD.  PAST MEDICAL HISTORY:   Past Medical History:  Diagnosis Date  . Allergy    environmental  . Asthma   . CKD (chronic kidney disease)   . CLL (chronic lymphoblastic leukemia)   . Constipation 11/15/2011  . COPD (chronic obstructive pulmonary disease) ([place])   . Diabetes mellitus without complication ([place])   . GERD (gastroesophageal reflux disease)   . Headache    migraines  . Heart murmur   . Hyperlipidemia   . Hypertension   . PVD (peripheral vascular disease) ([place])   . Stroke ([place]) 1998   in 1998 due to tumor-right side     FAMILY HISTORY:   Family History  Problem Relation Age of Onset  . Heart disease  Mother   . Asthma Mother   . Cancer Mother        uterine   . Depression Mother   . Heart attack Mother 55  . Hyperlipidemia Mother   . Hypertension Mother   . Stroke Mother   . Kidney disease Mother   . Heart attack Sister   . Stroke Sister   . Depression Sister   . Diabetes Sister   . Hyperlipidemia Sister   . Depression Sister   . Diabetes Sister   . Hyperlipidemia Sister   . HIV/AIDS Sister   . Diabetes Brother   . Asthma Brother   . Hypertension Brother   . Cancer Maternal Aunt        lung  . Heart disease Maternal Grandmother   . Heart attack Maternal Grandmother   . Cancer Brother        colon  . HIV/AIDS Brother   . Colon cancer Neg Hx     SOCIAL HISTORY:   Social History   Tobacco Use  . Smoking status: Current Every Day Smoker    Packs/day: 0.25    Years: 30.00    Pack years: 7.50    Types: Cigarettes    Start date: 01/01/1976  . Smokeless tobacco: Never Used  . Tobacco comment: ~ 3 cigarettes / day  Substance Use Topics  . Alcohol use: No    Alcohol/week: 0.0 standard drinks     ALLERGIES:   Allergies  Allergen Reactions  . No [employer] Food  Allergies Diarrhea and Nausea And Vomiting    Cabbage, stomach pain   . Aspirin Other (See Comments)    irritates stomach  . Baclofen Other (See Comments)    Stomach irritation     CURRENT MEDICATIONS:   Current Outpatient Medications  Medication Sig Dispense Refill  . acetaminophen (TYLENOL) 500 MG tablet Take 500-1,000 mg by mouth every 6 (six) hours as needed for moderate pain.     Marland Kitchen albuterol (ACCUNEB) 0.63 MG/3ML nebulizer solution Inhale 1 vial via nebulizer every 6 hours for wheezing as needed (Patient taking differently: Take 1 ampule by nebulization every 6 (six) hours as needed for wheezing or shortness of breath.) 75 mL 11  . albuterol (VENTOLIN HFA) 108 (90 Base) MCG/ACT inhaler Inhale 2 puffs by mouth every 6 hours as needed for wheezing or shortness of breath (Patient taking  differently: Inhale 2 puffs into the lungs every 6 (six) hours as needed for wheezing or shortness of breath.) 8.5 g 11  . Alcohol Swabs (ALCOHOL PADS) 70 % PADS SMARTSIG:[place]get(s) Topical Daily    . allopurinol (ZYLOPRIM) 300 MG tablet Take 0.5 tablets (150 mg total) by mouth daily. 30 tablet 0  . amLODipine (NORVASC) 5 MG tablet Take 5 mg by mouth daily.    Marland Kitchen atorvastatin (LIPITOR) 80 MG tablet Take 1 tablet (80 mg total) by mouth daily. 90 tablet 3  . b complex vitamins capsule Take 1 capsule by mouth daily. 30 capsule 5  . Blood Glucose Calibration (OT ULTRA/FASTTK CNTRL SOLN) SOLN See admin instructions.    . Blood Glucose Monitoring Suppl (ONETOUCH VERIO) w/Device KIT 1 Device by Does not apply route daily. 1 kit 0  . BREO ELLIPTA 100-25 MCG/INH AEPB Inhale 1 puff by mouth every day (Patient taking differently: Inhale 1 puff into the lungs daily.) 60 each 11  . clopidogrel (PLAVIX) 75 MG tablet Take 1 tablet by mouth every day 30 tablet 11  . cyclobenzaprine (FLEXERIL) 10 MG tablet Take 1 tablet by mouth 3 times a day as needed for muscle spasms. 90 tablet 11  . diclofenac Sodium (VOLTAREN) 1 % GEL Apply 2 grams topically four times a day 100 g 11  . Dulaglutide (TRULICITY) 6.06 VP/0.3EK SOPN Inject 0.75 mg into the skin once a week. 2 mL 0  . DULoxetine (CYMBALTA) 60 MG capsule Take 1 capsule (60 mg total) by mouth daily. 90 capsule 3  . empagliflozin (JARDIANCE) 10 MG TABS tablet Take 1 tablet (10 mg total) by mouth daily. 90 tablet 3  . ergocalciferol (VITAMIN D2) 1.25 MG (50000 UT) capsule Take 50,000 Units by mouth every Monday.    . famotidine (PEPCID) 20 MG tablet Take 2 tablets by mouth every day (Patient taking differently: Take 40 mg by mouth daily.) 60 tablet 11  . furosemide (LASIX) 20 MG tablet Take 1 tablet (20 mg total) by mouth daily. 30 tablet 3  . gabapentin (NEURONTIN) 300 MG capsule Take 1 capsule (300 mg total) by mouth 3 (three) times daily. X 1 week, then 600 mg BID-  long term 600 mg 2x/day-  for nerve pain (Patient taking differently: Take 300 mg by mouth 3 (three) times daily.) 360 capsule 3  . glucose blood (ONETOUCH VERIO) test strip Use to check blood sugar once a day 100 each 12  . hydrALAZINE (APRESOLINE) 50 MG tablet Take 50 mg by mouth 3 (three) times daily.    . iron polysaccharides (NIFEREX) 150 MG capsule Take 1 capsule (150 mg total) by mouth  daily. 30 capsule 5  . JARDIANCE 25 MG TABS tablet Take 25 mg by mouth daily.    Elmore Guise Devices (EASY MINI EJECT LANCING DEVICE) MISC daily.    . Lancets (ONETOUCH ULTRASOFT) lancets Use to check blood sugar once a day 100 each 12  . lidocaine (XYLOCAINE) 5 % ointment Apply 1 application topically as needed. Apply to R>L feet when burning up to 4x/day as needed 50 g 3  . metFORMIN (GLUCOPHAGE) 500 MG tablet Take 500 mg by mouth daily.    . montelukast (SINGULAIR) 10 MG tablet Take 1 tablet by mouth at bedtime (Patient taking differently: Take 10 mg by mouth at bedtime.) 30 tablet 11  . nitroGLYCERIN (NITROSTAT) 0.4 MG SL tablet Dissolve 1 tablet under tongue every 5 minutes, up to 3 doses for chest pain (Patient taking differently: Place 0.4 mg under the tongue every 5 (five) minutes as needed for chest pain. up to 3 doses) 25 tablet 11  . ondansetron (ZOFRAN) 8 MG tablet Take 1 tablet (8 mg total) by mouth 2 (two) times daily as needed (Nausea or vomiting). 30 tablet 1  . prochlorperazine (COMPAZINE) 10 MG tablet Take 1 tablet (10 mg total) by mouth every 6 (six) hours as needed (Nausea or vomiting). 30 tablet 1  . traMADol (ULTRAM) 50 MG tablet Take 1 tablet by mouth twice daily as needed for moderate to severe pain 30 tablet 3   No current facility-administered medications for this visit.    REVIEW OF SYSTEMS:   '[X]'  denotes positive finding, '[ ]'  denotes negative finding Cardiac  Comments:  Chest pain or chest pressure:    Shortness of breath upon exertion:    Short of breath when lying flat:     Irregular heart rhythm:        Vascular    Pain in calf, thigh, or hip brought on by ambulation:    Pain in feet at night that wakes you up from your sleep:     Blood clot in your veins:    Leg swelling:  x       Pulmonary    Oxygen at home:    Productive cough:     Wheezing:         Neurologic    Sudden weakness in arms or legs:     Sudden numbness in arms or legs:     Sudden onset of difficulty speaking or slurred speech:    Temporary loss of vision in one eye:     Problems with dizziness:         Gastrointestinal    Blood in stool:     Vomited blood:         Genitourinary    Burning when urinating:     Blood in urine:        Psychiatric    Major depression:         Hematologic    Bleeding problems:    Problems with blood clotting too easily:        Skin    Rashes or ulcers:        Constitutional    Fever or chills:      PHYSICAL EXAM:   Vitals:   02/23/21 1201  BP: (!) 143/74  Pulse: 73  Resp: 20  Temp: 97.8 F (36.6 C)  SpO2: 95%  Weight: 64 kg  Height: '5\' 1"'  (1.549 m)    GENERAL: The patient is a well-nourished female, in no acute distress. The  vital signs are documented above. CARDIAC: There is a regular rate and rhythm.  VASCULAR: Palpable right dorsalis pedis pulse.  2+ pitting edema. PULMONARY: Non-labored respirations ABDOMEN: Soft and non-tender with normal pitched bowel sounds.  MUSCULOSKELETAL: There are no major deformities or cyanosis. NEUROLOGIC: No focal weakness or paresthesias are detected. SKIN: There are no ulcers or rashes noted. PSYCHIATRIC: The patient has a normal affect.  STUDIES:   I have reviewed the following: +-------+-----------+-----------+------------+------------+  ABI/TBIToday's ABIToday's TBIPrevious ABIPrevious TBI  +-------+-----------+-----------+------------+------------+  Right 0.99    0.64    0.55    0.32      +-------+-----------+-----------+------------+------------+   Left  0.65    0.63    0.56    0.47      +-------+-----------+-----------+------------+------------+  Right: Patent right femoral to femoral bypass graft with no visualized  MEDICAL ISSUES:   The patient's bypass graft appears to be widely patent.  She has a palpable pedal pulse.  She will follow-up  with repeat ABIs and duplex, per protocol.  She is scheduled to start chemotherapy for leukemia in the near future.    Leia Alf, MD, FACS [facility] [phone] Pager [phone]

## 2021-02-25 ENCOUNTER — Other Ambulatory Visit: Payer: Self-pay

## 2021-02-25 DIAGNOSIS — I739 Peripheral vascular disease, unspecified: Secondary | ICD-10-CM

## 2021-02-27 ENCOUNTER — Other Ambulatory Visit: Payer: Self-pay | Admitting: Family Medicine

## 2021-03-02 ENCOUNTER — Other Ambulatory Visit: Payer: Self-pay

## 2021-03-02 ENCOUNTER — Ambulatory Visit (HOSPITAL_COMMUNITY)
Admission: RE | Admit: 2021-03-02 | Discharge: 2021-03-02 | Disposition: A | Discharge status: Home / Self Care | Payer: Medicare Other | Source: Ambulatory Visit | Attending: Hematology | Admitting: Hematology

## 2021-03-02 DIAGNOSIS — I700 Atherosclerosis of aorta: Secondary | ICD-10-CM | POA: Insufficient documentation

## 2021-03-02 DIAGNOSIS — J439 Emphysema, unspecified: Secondary | ICD-10-CM | POA: Insufficient documentation

## 2021-03-02 DIAGNOSIS — I2510 Atherosclerotic heart disease of native coronary artery without angina pectoris: Secondary | ICD-10-CM | POA: Insufficient documentation

## 2021-03-02 DIAGNOSIS — C9110 Chronic lymphocytic leukemia of B-cell type not having achieved remission: Secondary | ICD-10-CM | POA: Insufficient documentation

## 2021-03-02 DIAGNOSIS — C9111 Chronic lymphocytic leukemia of B-cell type in remission: Secondary | ICD-10-CM | POA: Diagnosis not present

## 2021-03-02 LAB — GLUCOSE, CAPILLARY: Glucose-Capillary: 122 mg/dL — ABNORMAL HIGH (ref 70–99)

## 2021-03-02 MED ORDER — FLUDEOXYGLUCOSE F - 18 (FDG) INJECTION
7.0000 | Freq: Once | INTRAVENOUS | Status: AC | PRN
  Administered 2021-03-02: 7 via INTRAVENOUS

## 2021-03-02 NOTE — Progress Notes (Deleted)
   Subjective:   Patient ID: LAELYN BLUMENTHAL    DOB: [birth year], 75 y.o. female   MRN: [id number]  WILLARD MADRIGAL is a 75 y.o. female with a history of *** here for ***  Diabetes: Last three A1C's below. Currently on Metformin 500mg  QD (renally adjusted), Jardiance 10mg  QD, and Trulicty 0.75mg  q weekly. Endorses compliance. Notes CBGs range ***. Denies any hypoglycemia. Denies any polyuria, polydipsia, polyphagia. Due for diabetic eye exam and foot exam. Dexcom: document in plan "A1C ***. Currently monitoring and taking >3 injection per day". Initial RX send message to RN team. Refills - just send to pharmacy "Patient is a good candidate for a CGM device given history of hypoglycemia and using 3 insulin injections per day."  - Continuous Blood Gluc Receiver (DEXCOM G6 RECEIVER) DEVI  - Continuous Blood Gluc Sensor (DEXCOM G6 SENSOR) MISC  - Continuous Blood Gluc Transmit (DEXCOM G6 TRANSMITTER) MISC Lantus/Novolug/Humalog Kwikpens/GLP-1 pens - still need to order BD Insulin Pen Needle (B-D UF III MINI PEN NEEDLES) 31G X 5 MM MISC  Lab Results  Component Value Date   HGBA1C 8.0 (A) 01/06/2021   HGBA1C 7.4 (H) 09/24/2020   HGBA1C 6.7 07/10/2020   Health Maintenance: Health Maintenance Due  Topic  . OPHTHALMOLOGY EXAM   . TETANUS/TDAP   . FOOT EXAM   . MAMMOGRAM    Review of Systems:  Per HPI.   Objective:   There were no vitals taken for this visit. Vitals and nursing note reviewed.  General: pleasant ***, sitting comfortably in exam chair, well nourished, well developed, in no acute distress with non-toxic appearance HEENT: normocephalic, atraumatic, moist mucous membranes, oropharynx clear without erythema or exudate, TM normal bilaterally  Neck: supple, non-tender without lymphadenopathy CV: regular rate and rhythm without murmurs, rubs, or gallops, no lower extremity edema, 2+ radial and pedal pulses bilaterally Lungs: clear to auscultation bilaterally with normal work of  breathing on room air Resp: breathing comfortably on room air, speaking in full sentences Abdomen: soft, non-tender, non-distended, no masses or organomegaly palpable, normoactive bowel sounds Skin: warm, dry, no rashes or lesions Extremities: warm and well perfused, normal tone MSK: ROM grossly intact, strength intact, gait normal Neuro: Alert and oriented, speech normal  Assessment & Plan:   No problem-specific Assessment & Plan notes found for this encounter.  No orders of the defined types were placed in this encounter.  No orders of the defined types were placed in this encounter.   {    This will disappear when note is signed, click to select method of visit    :1}  Mina Marble, DO PGY-3, [facility] 03/02/2021 4:04 PM

## 2021-03-04 ENCOUNTER — Telehealth: Payer: Self-pay | Admitting: *Deleted

## 2021-03-04 ENCOUNTER — Ambulatory Visit: Payer: Medicare Other | Admitting: Family Medicine

## 2021-03-04 NOTE — Progress Notes (Signed)
HEMATOLOGY/ONCOLOGY CLINIC NOTE  Date of Service: 03/05/21    Patient Care Team: Danna Hefty, DO as PCP - General (Family Medicine) Sueanne Margarita, MD as PCP - Cardiology (Cardiology)  CHIEF COMPLAINTS/PURPOSE OF CONSULTATION:  F/u for CLL   HISTORY OF PRESENTING ILLNESS:  [name] [name] is a wonderful 75 y.o. female smoker with history of CLL who has been referred to [place] by Dr Tarry Kos, Archie Endo, DO at [facility] for evaluation and management of elevated WBC/lymphocytes.   She is accompanied by her husband. She reported to ED on 11/27/2017 with cough and abdominal pain with COPD exacerbation. Her labs on this day showed WBC elevated at 62.5. She has not previously seen a hematologist. She reports a plan to quit smoking on the first day of the new year. She reports worsening decrease in balance and states she has a cane and walker that she uses as needed. She was taking Prednisone.  No weight loss/fevers/chills/night sweats/ palpable lumps or bumps.  On review of systems, pt reports back pain, change in balance, abdominal pain, weight loss and denies changes in BM, fever and any other accompanying symptoms.   INTERVAL HISTORY:  [name] [name] is here for management and evaluation of her Chronic Leukocytic Leukemia. The patient's last visit with [place] was on 02/06/2021. The pt reports that she is doing well overall. She is here prior to [name] [name], tomorrow on 03/06/2021..  The pt reports that she has no new symptoms. The pt notes that she is reasonably nervous about staring her treatment and the medications. She notes some anxiety due to her niece experiencing cancer. The pt notes that she is receiving assistance from the social worker regarding transportation until her vehicle is fixed.  Of note since the patient's last visit, pt has had NM PET Skull Base to Thigh ([id number]) on 03/02/2021, which revealed "1. Small, mildly hypermetabolic lymph nodes in the neck,  axillary regions, possibly right hilum, abdomen and pelvis, consistent with the given history of CLL. 2. Intramuscular fluid density lesion in the posterior left thigh shows mild hypermetabolism and is of uncertain etiology. 3. Aortic atherosclerosis (ICD10-I70.0). Coronary artery calcification. 4. Enlarged pulmonic trunk, indicative of pulmonary arterial hypertension. 5. Emphysema (ICD10-J43.9)."  Lab results today 03/05/2021 CBC w/diff and CMP is as follows: all values are WNL except for WBC of 190.0K, Hgb of 9.6, HCT of 34.0, Mch of 24.0, MCHC of 28.2, RDW of 32.3, Neutro Abs of 9.5K, Lymphs Abs of 178.6K, Monocytes Absolute of 1.9K, Glucose of 11, Creatinine of 1.34, Total Protein of 6.4, Alkaline Phosphatase of 260, GFR est of 42. 03/05/2021 Hepatitis B Surface Antigen in progress. 03/05/2021 Hep B Core Antibody Total in progress.  On review of systems, pt reports intermittent fevers, chills, night sweats, anxiety and denies leg swelling, abdominal pain, back pain, and any other symptoms.  MEDICAL HISTORY:  Past Medical History:  Diagnosis Date  . Allergy    environmental  . Asthma   . CKD (chronic kidney disease)   . CLL (chronic lymphoblastic leukemia)   . Constipation 11/15/2011  . COPD (chronic obstructive pulmonary disease) ([place])   . Diabetes mellitus without complication ([place])   . GERD (gastroesophageal reflux disease)   . Headache    migraines  . Heart murmur   . Hyperlipidemia   . Hypertension   . PVD (peripheral vascular disease) ([place])   . Stroke [facility]) 1998   in 1998 due to tumor-right side  SURGICAL HISTORY: Past Surgical History:  Procedure Laterality Date  . ABDOMINAL AORTOGRAM W/LOWER EXTREMITY Bilateral 08/12/2020   Procedure: ABDOMINAL AORTOGRAM W/BILATERAL LOWER EXTREMITY RUNOFF;  Surgeon: Serafina Mitchell, MD;  Location: [facility];  Service: Cardiovascular;  Laterality: Bilateral;  . ABI  02/2012   ABI <0.65 BL 02/2012  . BRAIN MENINGIOMA EXCISION     . Bypass grafting of RLE for PAD claudication     . CARDIAC CATHETERIZATION  04/09/208  . CESAREAN SECTION     1974, 78 ,80  . CHOLECYSTECTOMY, LAPAROSCOPIC    . COLONOSCOPY  2014   [place], [place]  . ENDARTERECTOMY FEMORAL Right 10/03/2020   Procedure: RIGHT FEMORAL ENDARTERECTOMY WITH VEIN PATCH ANGIOPLASTY;  Surgeon: Serafina Mitchell, MD;  Location: [facility] OR;  Service: Vascular;  Laterality: Right;  . ESOPHAGEAL DILATION    . FEMORAL-POPLITEAL BYPASS GRAFT Right 10/03/2020   Procedure: RIGHT FEMORAL- BELOW KNEE POPLITEAL BYPASS USING GORE PROPATEN 6MM GRAFT;  Surgeon: Serafina Mitchell, MD;  Location: [facility];  Service: Vascular;  Laterality: Right;  . PATCH ANGIOPLASTY  10/03/2020   Procedure: VEIN PATCH ANGIOPLASTY;  Surgeon: Serafina Mitchell, MD;  Location: [facility];  Service: Vascular;;    SOCIAL HISTORY: Social History   Socioeconomic History  . Marital status: Married    Spouse name: Not on file  . Number of children: 3  . Years of education: Not on file  . Highest education level: Not on file  Occupational History  . Occupation: DISABLED    Employer: DISABLED  Tobacco Use  . Smoking status: Current Every Day Smoker    Packs/day: 0.25    Years: 30.00    Pack years: 7.50    Types: Cigarettes    Start date: 01/01/1976  . Smokeless tobacco: Never Used  . Tobacco comment: ~ 3 cigarettes / day  Vaping Use  . Vaping Use: Never used  Substance and Sexual Activity  . Alcohol use: No    Alcohol/week: 0.0 standard drinks  . Drug use: No  . Sexual activity: Yes  Other Topics Concern  . Not on file  Social History Narrative   Health Care POA:    Emergency Contact: Ridley Dileo [phone] (c)   End of Life Plan:    Who lives with you: Lives with husband   Any pets: none   Diet: Patient lacks financial resources for much food. Pt reports eating what is available.   Exercise: Patient does not have an exercise plan.   Seatbelts: Patient reports wearing seatbelt when in vehicle.     [name] Exposure/Protection:   Hobbies: Bowling, computer games, Bingo      Has financial difficulties and transportation issues as she and her husband share transportation       Social Determinants of Radio broadcast assistant Strain: Not on file  Food Insecurity: Not on file  Transportation Needs: Not on file  Physical Activity: Not on file  Stress: Not on file  Social Connections: Not on file  Intimate Partner Violence: Not on file    FAMILY HISTORY: Family History  Problem Relation Age of Onset  . Heart disease Mother   . Asthma Mother   . Cancer Mother        uterine   . Depression Mother   . Heart attack Mother 77  . Hyperlipidemia Mother   . Hypertension Mother   . Stroke Mother   . Kidney disease Mother   . Heart attack Sister   .  Stroke Sister   . Depression Sister   . Diabetes Sister   . Hyperlipidemia Sister   . Depression Sister   . Diabetes Sister   . Hyperlipidemia Sister   . HIV/AIDS Sister   . Diabetes Brother   . Asthma Brother   . Hypertension Brother   . Cancer Maternal Aunt        lung  . Heart disease Maternal Grandmother   . Heart attack Maternal Grandmother   . Cancer Brother        colon  . HIV/AIDS Brother   . Colon cancer Neg Hx     ALLERGIES:  is allergic to no healthtouch food allergies, aspirin, and baclofen.  MEDICATIONS:  Current Outpatient Medications  Medication Sig Dispense Refill  . famotidine (PEPCID) 20 MG tablet Take 2 tablets by mouth every day 60 tablet 11  . montelukast (SINGULAIR) 10 MG tablet Take 1 tablet (10 mg total) by mouth at bedtime. 30 tablet 11  . acetaminophen (TYLENOL) 500 MG tablet Take 500-1,000 mg by mouth every 6 (six) hours as needed for moderate pain.     Marland Kitchen albuterol (ACCUNEB) 0.63 MG/3ML nebulizer solution Inhale 1 vial via nebulizer every 6 hours for wheezing as needed (Patient taking differently: Take 1 ampule by nebulization every 6 (six) hours as needed for wheezing or shortness of breath.) 75  mL 11  . albuterol (VENTOLIN HFA) 108 (90 Base) MCG/ACT inhaler Inhale 2 puffs by mouth every 6 hours as needed for wheezing or shortness of breath (Patient taking differently: Inhale 2 puffs into the lungs every 6 (six) hours as needed for wheezing or shortness of breath.) 8.5 g 11  . Alcohol Swabs (ALCOHOL PADS) 70 % PADS SMARTSIG:Pledget(s) Topical Daily    . allopurinol (ZYLOPRIM) 300 MG tablet Take 0.5 tablets (150 mg total) by mouth daily. 30 tablet 0  . amLODipine (NORVASC) 5 MG tablet Take 5 mg by mouth daily.    Marland Kitchen atorvastatin (LIPITOR) 80 MG tablet Take 1 tablet (80 mg total) by mouth daily. 90 tablet 3  . b complex vitamins capsule Take 1 capsule by mouth daily. 30 capsule 5  . Blood Glucose Calibration (OT ULTRA/FASTTK CNTRL SOLN) SOLN See admin instructions.    . Blood Glucose Monitoring Suppl (ONETOUCH VERIO) w/Device KIT 1 Device by Does not apply route daily. 1 kit 0  . BREO ELLIPTA 100-25 MCG/INH AEPB Inhale 1 puff by mouth every day (Patient taking differently: Inhale 1 puff into the lungs daily.) 60 each 11  . clopidogrel (PLAVIX) 75 MG tablet Take 1 tablet by mouth every day 30 tablet 11  . cyclobenzaprine (FLEXERIL) 10 MG tablet Take 1 tablet by mouth 3 times a day as needed for muscle spasms. 90 tablet 11  . diclofenac Sodium (VOLTAREN) 1 % GEL Apply 2 grams topically four times a day 100 g 11  . Dulaglutide (TRULICITY) 6.16 WV/3.7TG SOPN Inject 0.75 mg into the skin once a week. 2 mL 0  . DULoxetine (CYMBALTA) 60 MG capsule Take 1 capsule (60 mg total) by mouth daily. 90 capsule 3  . empagliflozin (JARDIANCE) 10 MG TABS tablet Take 1 tablet (10 mg total) by mouth daily. 90 tablet 3  . ergocalciferol (VITAMIN D2) 1.25 MG (50000 UT) capsule Take 50,000 Units by mouth every Monday.    . furosemide (LASIX) 20 MG tablet Take 1 tablet (20 mg total) by mouth daily. 30 tablet 3  . gabapentin (NEURONTIN) 300 MG capsule Take 1 capsule (300 mg total) by  mouth 3 (three) times daily. X 1  week, then 600 mg BID- long term 600 mg 2x/day-  for nerve pain (Patient taking differently: Take 300 mg by mouth 3 (three) times daily.) 360 capsule 3  . glucose blood (ONETOUCH VERIO) test strip Use to check blood sugar once a day 100 each 12  . hydrALAZINE (APRESOLINE) 50 MG tablet Take 50 mg by mouth 3 (three) times daily.    . iron polysaccharides (NIFEREX) 150 MG capsule Take 1 capsule (150 mg total) by mouth daily. 30 capsule 5  . JARDIANCE 25 MG TABS tablet Take 25 mg by mouth daily.    Elmore Guise Devices (EASY MINI EJECT LANCING DEVICE) MISC daily.    . Lancets (ONETOUCH ULTRASOFT) lancets Use to check blood sugar once a day 100 each 12  . lidocaine (XYLOCAINE) 5 % ointment Apply 1 application topically as needed. Apply to R>L feet when burning up to 4x/day as needed 50 g 3  . metFORMIN (GLUCOPHAGE) 500 MG tablet Take 500 mg by mouth daily.    . nitroGLYCERIN (NITROSTAT) 0.4 MG SL tablet Dissolve 1 tablet under tongue every 5 minutes, up to 3 doses for chest pain (Patient taking differently: Place 0.4 mg under the tongue every 5 (five) minutes as needed for chest pain. up to 3 doses) 25 tablet 11  . ondansetron (ZOFRAN) 8 MG tablet Take 1 tablet (8 mg total) by mouth 2 (two) times daily as needed (Nausea or vomiting). 30 tablet 1  . prochlorperazine (COMPAZINE) 10 MG tablet Take 1 tablet (10 mg total) by mouth every 6 (six) hours as needed (Nausea or vomiting). 30 tablet 1  . traMADol (ULTRAM) 50 MG tablet Take 1 tablet by mouth twice daily as needed for moderate to severe pain 30 tablet 3   No current facility-administered medications for this visit.    REVIEW OF SYSTEMS:   10 Point review of Systems was done is negative except as noted above.  PHYSICAL EXAMINATION: ECOG FS:2 - Symptomatic, <50% confined to bed  Vitals:   03/05/21 0839  BP: 137/65  Pulse: 79  Resp: 14  Temp: 97.7 F (36.5 C)  SpO2: 98%   Wt Readings from Last 3 Encounters:  03/05/21 141 lb 14.4 oz (64.4 kg)   02/23/21 141 lb (64 kg)  02/17/21 143 lb 3.2 oz (65 kg)   Body mass index is 26.81 kg/m.     Exam was given in a chair.   GENERAL:alert, in no acute distress and comfortable SKIN: no acute rashes, no significant lesions EYES: conjunctiva are pink and non-injected, sclera anicteric OROPHARYNX: MMM, no exudates, no oropharyngeal erythema or ulceration NECK: supple, no JVD LYMPH:  no palpable lymphadenopathy in the cervical, axillary or inguinal regions LUNGS: clear to auscultation b/l with normal respiratory effort HEART: regular rate & rhythm ABDOMEN:  normoactive bowel sounds , non tender, not distended. Extremity: no pedal edema PSYCH: alert & oriented x 3 with fluent speech NEURO: no focal motor/sensory deficits  LABORATORY DATA:  I have reviewed the data as listed   CBC Latest Ref Rng & Units 03/05/2021 02/06/2021 12/05/2020  WBC 4.0 - 10.5 K/uL 190.0(HH) 212.1(HH) 180.8(HH)  Hemoglobin 12.0 - 15.0 g/dL 9.6(L) 8.5(L) 8.6(L)  Hematocrit 36.0 - 46.0 % 34.0(L) 30.6(L) 30.2(L)  Platelets 150 - 400 K/uL 186 202 256    . CMP Latest Ref Rng & Units 03/05/2021 02/06/2021 01/27/2021  Glucose 70 - 99 mg/dL 111(H) 126(H) 174(H)  BUN 8 - 23 mg/dL 12 12  13  Creatinine 0.44 - 1.00 mg/dL 1.34(H) 1.71(H) 1.89(H)  Sodium 135 - 145 mmol/L 142 139 144  Potassium 3.5 - 5.1 mmol/L 4.1 4.0 4.6  Chloride 98 - 111 mmol/L 109 103 103  CO2 22 - 32 mmol/L _0 Calcium 8.9 - 10.3 mg/dL 9.1 9.2 9.5  Total Protein 6.5 - 8.1 g/dL 6.4(L) 6.6 -  Total Bilirubin 0.3 - 1.2 mg/dL 0.4 0.3 -  Alkaline Phos 38 - 126 U/L 260(H) 175(H) -  AST 15 - 41 U/L 28 19 -  ALT 0 - 44 U/L 29 11 -   .Marland Kitchen Lab Results  Component Value Date   IRON 37 (L) 02/06/2021   TIBC 424 02/06/2021   IRONPCTSAT 9 (L) 02/06/2021   (Iron and TIBC)  Lab Results  Component Value Date   FERRITIN 15 02/06/2021    Component     Latest Ref Rng & Units 02/06/2021  Vitamin B12     180 - 914 pg/mL 448  Haptoglobin     42 - 346  mg/dL 223   Lab Results  Component Value Date   LDH 445 (H) 02/06/2021        . Lab Results  Component Value Date   LDH 445 (H) 02/06/2021    RADIOGRAPHIC STUDIES: I have personally reviewed the radiological images as listed and agreed with the findings in the report. NM PET Image Initial (PI) Skull Base To Thigh  Result Date: 03/02/2021 CLINICAL DATA:  Initial treatment strategy for CLL. EXAM: NUCLEAR MEDICINE PET SKULL BASE TO THIGH TECHNIQUE: 7.0 mCi F-18 FDG was injected intravenously. Full-ring PET imaging was performed from the skull base to thigh after the radiotracer. CT data was obtained and used for attenuation correction and anatomic localization. Fasting blood glucose: 122 mg/dl COMPARISON:  CT chest 01/26/2021 and CT abdomen pelvis 03/25/2020. FINDINGS: Mediastinal blood pool activity: SUV max 2.2 Liver activity: SUV max 3.6 NECK: Small level 2 lymph measure up to 6 mm on the right (4/30) with an SUV max of 2.4. Incidental CT findings: None. CHEST: Hypermetabolic axillary lymph nodes are seen bilaterally. Index left axillary lymph node measures 1.6 cm (4/51) with an SUV max of 4.0. No hypermetabolic mediastinal lymph nodes. There may be a hypermetabolic right hilar lymph node, SUV max 3.1. No hypermetabolic pulmonary nodules. Incidental CT findings: Atherosclerotic calcification of the aorta, aortic valve and coronary arteries. Pulmonic trunk is enlarged. Heart is at the upper limits of normal in size. No pericardial or pleural effusion. Centrilobular emphysema. A few scattered pulmonary nodules are too small for PET resolution and distorted by respiratory motion. ABDOMEN/PELVIS: No abnormal hypermetabolism in the liver, adrenal glands, spleen or pancreas. Mildly hypermetabolic retroperitoneal lymph nodes. Index left periaortic lymph node measures 6 mm (4/105) with an SUV max of 2.4. Index right common iliac lymph node measures 8 mm (4/125) with an SUV max of 4.6. Index right  external iliac lymph node measures 8 mm (4/144) with an SUV max of 2.4. Finally, index right inguinal lymph node measures 9 mm (4/160) with an SUV max of 4.3. An intermuscular fluid density lesion in the posterior left thigh measures 3.5 cm (4/177) with an SUV max of 4.0. Incidental CT findings: Liver, gallbladder, adrenal glands, kidneys, spleen, pancreas, stomach and bowel are grossly unremarkable. Cholecystectomy. Atherosclerotic calcification of the aorta. Bladder wall appears thickened but is under distended. SKELETON: No abnormal hypermetabolism. Incidental CT findings: A lipoma is seen in left quadriceps musculature. IMPRESSION: 1. Small, mildly hypermetabolic  lymph nodes in the neck, axillary regions, possibly right hilum, abdomen and pelvis, consistent with the given history of CLL. 2. Intramuscular fluid density lesion in the posterior left thigh shows mild hypermetabolism and is of uncertain etiology. 3. Aortic atherosclerosis (ICD10-I70.0). Coronary artery calcification. 4. Enlarged pulmonic trunk, indicative of pulmonary arterial hypertension. 5.  Emphysema (ICD10-J43.9). Electronically Signed   By: Lorin Picket M.D.   On: 03/02/2021 14:16   VAS [place] ABI WITH/WO TBI  Result Date: 02/23/2021 LOWER EXTREMITY DOPPLER STUDY Indications: Peripheral artery disease. High Risk Factors: Hypertension, hyperlipidemia, Diabetes, current smoker.  Vascular Interventions: Right femoral to below knee popliteal bypass graft                         10/03/2020. Right SFA stent 2009 by Dr. Trula Slade. Left                         fem-pop BPG in [place]. Comparison Study: Increased right post operative ABI since prior exam of                   08/04/2020 Performing Technologist: Alvia Grove RVT  Examination Guidelines: A complete evaluation includes at minimum, Doppler waveform signals and systolic blood pressure reading at the level of bilateral brachial, anterior tibial, and posterior tibial arteries, when vessel  segments are accessible. Bilateral testing is considered an integral part of a complete examination. Photoelectric Plethysmograph (PPG) waveforms and toe systolic pressure readings are included as required and additional duplex testing as needed. Limited examinations for reoccurring indications may be performed as noted.  ABI Findings: +---------+------------------+-----+----------+--------+ Right    Rt Pressure (mmHg)IndexWaveform  Comment  +---------+------------------+-----+----------+--------+ Brachial 140                                       +---------+------------------+-----+----------+--------+ PTA      139               0.97 triphasic          +---------+------------------+-----+----------+--------+ DP       142               0.99 monophasic         +---------+------------------+-----+----------+--------+ Great Toe92                0.64 Abnormal           +---------+------------------+-----+----------+--------+ +---------+------------------+-----+----------+-------+ Left     Lt Pressure (mmHg)IndexWaveform  Comment +---------+------------------+-----+----------+-------+ Brachial 144                                      +---------+------------------+-----+----------+-------+ PTA      91                0.63 monophasic        +---------+------------------+-----+----------+-------+ DP       94                0.65 monophasic        +---------+------------------+-----+----------+-------+ Great Toe90                0.62 Abnormal          +---------+------------------+-----+----------+-------+ +-------+-----------+-----------+------------+------------+ ABI/TBIToday's ABIToday's TBIPrevious ABIPrevious TBI +-------+-----------+-----------+------------+------------+ Right  0.99       0.64  0.55        0.32         +-------+-----------+-----------+------------+------------+ Left   0.65       0.63       0.56        0.47          +-------+-----------+-----------+------------+------------+   Summary: Right: Resting right ankle-brachial index is within normal range. No evidence of significant right lower extremity arterial disease. The right toe-brachial index is abnormal. Left: Resting left ankle-brachial index indicates moderate left lower extremity arterial disease. The left toe-brachial index is abnormal.  *See table(s) above for measurements and observations.  Electronically signed by Harold Barban MD on 02/23/2021 at 11:53:47 AM.    Final    VAS [place] LOWER EXTREMITY ARTERIAL DUPLEX  Result Date: 02/23/2021 LOWER EXTREMITY ARTERIAL DUPLEX STUDY Indications: Peripheral artery disease. Right femoral to popliteal bypass graft              placed 10/03/2020 High Risk Factors: Hypertension, hyperlipidemia, Diabetes, current smoker.  Current ABI: Right ABI 0.99 Left 0.65 Limitations: Body habitus and depth of graft. Comparison Study: No prior post operative exam Performing Technologist: Alvia Grove RVT  Examination Guidelines: A complete evaluation includes B-mode imaging, spectral Doppler, color Doppler, and power Doppler as needed of all accessible portions of each vessel. Bilateral testing is considered an integral part of a complete examination. Limited examinations for reoccurring indications may be performed as noted.   Right Graft #1: +------------------+--------+--------+----------+--------+                   PSV cm/sStenosisWaveform  Comments +------------------+--------+--------+----------+--------+ Inflow            252             monophasic         +------------------+--------+--------+----------+--------+ Prox Anastomosis  86              biphasic           +------------------+--------+--------+----------+--------+ Proximal Graft    131             monophasic         +------------------+--------+--------+----------+--------+ Mid Graft         89              monophasic          +------------------+--------+--------+----------+--------+ Distal Graft      104             monophasic         +------------------+--------+--------+----------+--------+ Distal Anastomosis173             monophasic         +------------------+--------+--------+----------+--------+ Outflow           150             monophasic         +------------------+--------+--------+----------+--------+   Summary: Right: Patent right femoral to femoral bypass graft with no visualized stenosis. The mid to distal thigh graft not well visualized due to depth.  See table(s) above for measurements and observations. Electronically signed by Harold Barban MD on 02/23/2021 at 11:55:19 AM.    Final    Surgical Pathology 12/13/2017   ASSESSMENT & PLAN:   [name] [name] is a wonderful 75 y.o. female with    1. Rai 1 Chronic Lymphocytic Leukemia  - Trisomy 12 mutation. No associated thrombocytopenia.  Mild anemia - likely from acute blood loss from significant epistaxis. No constitutional symptoms. Minimal LNaednopathy No splenomegaly  #  2 .Lung nodules on CT in 12/2017. Rpt CT chest 08/09/2018 - stable  #3 Patient Active Problem List   Diagnosis Date Noted  . Counseling regarding advance care planning and goals of care 02/19/2021  . Iron deficiency anemia 02/12/2021  . Peripheral arterial disease ([place]) 10/03/2020  . (HFpEF) heart failure with preserved ejection fraction ([place]) 06/11/2020  . Venous insufficiency (chronic) (peripheral) 06/22/2019  . Pulmonary nodules 01/04/2018  . Mass of left lower leg 01/04/2018  . Chronic kidney disease (CKD), stage III (moderate) ([place]) 06/04/2016  . Chronic back pain 10/02/2015  . DM (diabetes mellitus), type 2 with peripheral vascular complications ([place]) 33/54/5625  . Chronic lymphocytic leukemia ([place]) 04/11/2009  . PERIPHERAL VASCULAR DISEASE 10/13/2007  . HYPERCHOLESTEROLEMIA 02/23/2007  . ANXIETY 02/23/2007  . TOBACCO DEPENDENCE 02/23/2007  .  Essential hypertension 02/23/2007  . COPD (chronic obstructive pulmonary disease) ([place]) 02/23/2007  . GASTROESOPHAGEAL REFLUX, NO ESOPHAGITIS 02/23/2007   PLAN: -Discussed pt labwork today, 03/05/2021; Hgb improved due to oral iron, WBC still elevated. Kidney function stable.  -Discussed pt NM PET Skull Base to Thigh ([id number]) on 03/02/2021; small lymph node in neck. Lymph nodes spread throughout but none bulky.  -Advised pt she does have anemia and constitutional symptoms of CLL.  -Advised pt that the infusions take around 4-6 hours and subsequent infusions may be quicker. The first dosage is less to observe if allergic reaction and toleration. -Advised pt she will continue the pills for up to a year.  -Continue to wear compression socks and elevate legs to alleviate leg swelling.  -Advised pt there are four doses in first month, then we switch to once monthly. -Continue Allopurinol to decrease uric acid and control gout.  -Recommended pt drink 48-64 oz water daily. -Continue PO Iron and B-complex daily.  -Will see back with C1D8 in around a week.    FOLLOW UP: Please schedule cycle 1 day 2 on 03/09/2021 with labs Please schedule cycle 1 day 8 on 03/16/2021 with labs and MD visit Please schedule cycle 1 day 15 on 03/23/2021 with labs  The total time spent in the appointment was 20 minutes and more than 50% was on counseling and direct patient cares.   All of the patient's questions were answered with apparent satisfaction. The patient knows to call the clinic with any problems, questions or concerns.    Sullivan Lone MD [place] AAHIVMS [facility] Hematology/Oncology Physician [facility]  (Office):       [phone] (Work cell):  [phone] (Fax):           [phone]  I, Reinaldo Raddle, am acting as scribe for Dr. Sullivan Lone, MD.  .I have reviewed the above documentation for accuracy and completeness, and I agree with the above. Brunetta Genera MD

## 2021-03-04 NOTE — Telephone Encounter (Signed)
Patient contacted office regarding appts - said she was half asleep when the reminder call came. Gave patient appt times: 3/10 @8 :30 for labs and 9A for Dr. Irene Limbo. 3/11 @ 8:00 for infusion. She verbalized understanding and said she will call her daughter to assist with transportation

## 2021-03-05 ENCOUNTER — Telehealth: Payer: Self-pay | Admitting: *Deleted

## 2021-03-05 ENCOUNTER — Other Ambulatory Visit: Payer: Self-pay

## 2021-03-05 ENCOUNTER — Inpatient Hospital Stay: Payer: Medicare Other | Admitting: Hematology

## 2021-03-05 ENCOUNTER — Inpatient Hospital Stay: Payer: Medicare Other | Attending: Hematology

## 2021-03-05 VITALS — BP 137/65 | HR 79 | Temp 97.7°F | Resp 14 | Ht 61.0 in | Wt 141.9 lb

## 2021-03-05 DIAGNOSIS — R918 Other nonspecific abnormal finding of lung field: Secondary | ICD-10-CM | POA: Diagnosis not present

## 2021-03-05 DIAGNOSIS — Z7189 Other specified counseling: Secondary | ICD-10-CM

## 2021-03-05 DIAGNOSIS — C9110 Chronic lymphocytic leukemia of B-cell type not having achieved remission: Secondary | ICD-10-CM

## 2021-03-05 DIAGNOSIS — Z5112 Encounter for antineoplastic immunotherapy: Secondary | ICD-10-CM | POA: Diagnosis not present

## 2021-03-05 LAB — CBC WITH DIFFERENTIAL/PLATELET
Abs Immature Granulocytes: 0 10*3/uL (ref 0.00–0.07)
Basophils Absolute: 0 10*3/uL (ref 0.0–0.1)
Basophils Relative: 0 %
Eosinophils Absolute: 0 10*3/uL (ref 0.0–0.5)
Eosinophils Relative: 0 %
HCT: 34 % — ABNORMAL LOW (ref 36.0–46.0)
Hemoglobin: 9.6 g/dL — ABNORMAL LOW (ref 12.0–15.0)
Lymphocytes Relative: 94 %
Lymphs Abs: 178.6 10*3/uL — ABNORMAL HIGH (ref 0.7–4.0)
MCH: 24 pg — ABNORMAL LOW (ref 26.0–34.0)
MCHC: 28.2 g/dL — ABNORMAL LOW (ref 30.0–36.0)
MCV: 85 fL (ref 80.0–100.0)
Monocytes Absolute: 1.9 10*3/uL — ABNORMAL HIGH (ref 0.1–1.0)
Monocytes Relative: 1 %
Neutro Abs: 9.5 10*3/uL — ABNORMAL HIGH (ref 1.7–7.7)
Neutrophils Relative %: 5 %
Platelets: 186 10*3/uL (ref 150–400)
RBC: 4 MIL/uL (ref 3.87–5.11)
RDW: 32.3 % — ABNORMAL HIGH (ref 11.5–15.5)
WBC: 190 10*3/uL (ref 4.0–10.5)
nRBC: 0.1 % (ref 0.0–0.2)

## 2021-03-05 LAB — COMPREHENSIVE METABOLIC PANEL
ALT: 29 U/L (ref 0–44)
AST: 28 U/L (ref 15–41)
Albumin: 3.8 g/dL (ref 3.5–5.0)
Alkaline Phosphatase: 260 U/L — ABNORMAL HIGH (ref 38–126)
Anion gap: 5 (ref 5–15)
BUN: 12 mg/dL (ref 8–23)
CO2: 28 mmol/L (ref 22–32)
Calcium: 9.1 mg/dL (ref 8.9–10.3)
Chloride: 109 mmol/L (ref 98–111)
Creatinine, Ser: 1.34 mg/dL — ABNORMAL HIGH (ref 0.44–1.00)
GFR, Estimated: 42 mL/min — ABNORMAL LOW (ref >60)
Glucose, Bld: 111 mg/dL — ABNORMAL HIGH (ref 70–99)
Potassium: 4.1 mmol/L (ref 3.5–5.1)
Sodium: 142 mmol/L (ref 135–145)
Total Bilirubin: 0.4 mg/dL (ref 0.3–1.2)
Total Protein: 6.4 g/dL — ABNORMAL LOW (ref 6.5–8.1)

## 2021-03-05 LAB — HEPATITIS B SURFACE ANTIGEN: Hepatitis B Surface Ag: NONREACTIVE

## 2021-03-05 LAB — HEPATITIS B CORE ANTIBODY, TOTAL: Hep B Core Total Ab: NONREACTIVE

## 2021-03-05 MED FILL — Dexamethasone Sodium Phosphate Inj 100 MG/10ML: INTRAMUSCULAR | Qty: 2 | Status: AC

## 2021-03-05 NOTE — Telephone Encounter (Signed)
CRITICAL VALUE STICKER  CRITICAL VALUE:WBC [id number]  RECEIVER (on-site recipient of call): Georgina Pillion, RN  DATE & TIME NOTIFIED: 03/05/21; 2957  MESSENGER (representative from lab):Jay  MD NOTIFIED: [place]  RESPONSE: Information acknowledged

## 2021-03-05 NOTE — Telephone Encounter (Signed)
Patient explained her car does not work and that family is trying to coordinate her transportation for her appts here. Explained [facility] [employer] program to patient. She requested to be registered with them.  Contacted [place], [employer] [employer],  [phone], to enroll patient with [employer].  Ms. Sharen Counter requested patient complete rider Waiver and Release of Liability - emailed copy to desk nurse. Patient reviewed document and signed. It was faxed to Ms. Sharen Counter - fax confirmation received.  Patient is scheduled for ride 3/11 for infusion appt at 8 am. Reviewed with patient that she will receive a call to confirm her ride with car description. Patient verbalized understanding

## 2021-03-06 ENCOUNTER — Telehealth: Payer: Self-pay | Admitting: *Deleted

## 2021-03-06 ENCOUNTER — Inpatient Hospital Stay: Payer: Medicare Other

## 2021-03-06 ENCOUNTER — Other Ambulatory Visit: Payer: Self-pay

## 2021-03-06 VITALS — BP 150/68 | HR 79 | Temp 98.6°F | Resp 20 | Ht 61.0 in | Wt 143.2 lb

## 2021-03-06 DIAGNOSIS — C9110 Chronic lymphocytic leukemia of B-cell type not having achieved remission: Secondary | ICD-10-CM | POA: Diagnosis not present

## 2021-03-06 DIAGNOSIS — Z5112 Encounter for antineoplastic immunotherapy: Secondary | ICD-10-CM | POA: Diagnosis not present

## 2021-03-06 DIAGNOSIS — Z7189 Other specified counseling: Secondary | ICD-10-CM

## 2021-03-06 DIAGNOSIS — R918 Other nonspecific abnormal finding of lung field: Secondary | ICD-10-CM | POA: Diagnosis not present

## 2021-03-06 MED ORDER — SODIUM CHLORIDE 0.9 % IV SOLN
20.0000 mg | Freq: Once | INTRAVENOUS | Status: AC
  Administered 2021-03-06: 20 mg via INTRAVENOUS
  Filled 2021-03-06: qty 20

## 2021-03-06 MED ORDER — ALBUTEROL SULFATE (2.5 MG/3ML) 0.083% IN NEBU
2.5000 mg | INHALATION_SOLUTION | Freq: Once | RESPIRATORY_TRACT | Status: AC | PRN
  Administered 2021-03-06: 2.5 mg via RESPIRATORY_TRACT
  Filled 2021-03-06: qty 3

## 2021-03-06 MED ORDER — SODIUM CHLORIDE 0.9 % IV SOLN
Freq: Once | INTRAVENOUS | Status: DC | PRN
  Filled 2021-03-06: qty 250

## 2021-03-06 MED ORDER — DIPHENHYDRAMINE HCL 50 MG/ML IJ SOLN
INTRAMUSCULAR | Status: AC
  Filled 2021-03-06: qty 1

## 2021-03-06 MED ORDER — MONTELUKAST SODIUM 10 MG PO TABS
10.0000 mg | ORAL_TABLET | Freq: Once | ORAL | Status: AC
  Administered 2021-03-06: 10 mg via ORAL

## 2021-03-06 MED ORDER — ACETAMINOPHEN 325 MG PO TABS
650.0000 mg | ORAL_TABLET | Freq: Once | ORAL | Status: AC
  Administered 2021-03-06: 650 mg via ORAL

## 2021-03-06 MED ORDER — METHYLPREDNISOLONE SODIUM SUCC 125 MG IJ SOLR
125.0000 mg | Freq: Once | INTRAMUSCULAR | Status: AC | PRN
  Administered 2021-03-06: 125 mg via INTRAVENOUS

## 2021-03-06 MED ORDER — MONTELUKAST SODIUM 10 MG PO TABS
ORAL_TABLET | ORAL | Status: AC
  Filled 2021-03-06: qty 1

## 2021-03-06 MED ORDER — ACETAMINOPHEN 325 MG PO TABS
ORAL_TABLET | ORAL | Status: AC
  Filled 2021-03-06: qty 2

## 2021-03-06 MED ORDER — SODIUM CHLORIDE 0.9 % IV SOLN
Freq: Once | INTRAVENOUS | Status: AC
  Filled 2021-03-06: qty 250

## 2021-03-06 MED ORDER — SODIUM CHLORIDE 0.9 % IV SOLN
100.0000 mg | Freq: Once | INTRAVENOUS | Status: AC
  Administered 2021-03-06: 100 mg via INTRAVENOUS
  Filled 2021-03-06: qty 4

## 2021-03-06 MED ORDER — FAMOTIDINE IN NACL 20-0.9 MG/50ML-% IV SOLN
20.0000 mg | Freq: Once | INTRAVENOUS | Status: AC | PRN
  Administered 2021-03-06: 20 mg via INTRAVENOUS

## 2021-03-06 MED ORDER — EPINEPHRINE 0.3 MG/0.3ML IJ SOAJ
0.3000 mg | Freq: Once | INTRAMUSCULAR | Status: DC | PRN
  Administered 2021-03-06: 0.3 mg via INTRAMUSCULAR

## 2021-03-06 MED ORDER — DIPHENHYDRAMINE HCL 50 MG/ML IJ SOLN
50.0000 mg | Freq: Once | INTRAMUSCULAR | Status: AC
Start: 2021-03-06 — End: 2021-03-06
  Administered 2021-03-06: 50 mg via INTRAVENOUS

## 2021-03-06 NOTE — Telephone Encounter (Signed)
Contacted Ms.Hlavaty to inquire how she is feeling this afternoon following infusion this morning. She stated that she was tired and had a headache. She said she is going to rest this afternoon. Encouraged rest and fluids and to contact office/after hours if she did not feel well.  Advised her that per Dr. Irene Limbo, she is not receiving any further Gazyva. She stated that he told her that as well. Advised her that all appointments for [facility] and labs associated with infusions are now cancelled and her next appt with MD is 3/23. She will have a lab appt before she sees Dr. Irene Limbo. Patient verbalized understanding of all information.

## 2021-03-06 NOTE — Patient Instructions (Signed)
[name] Discharge Instructions for Patients Receiving Chemotherapy  Today you received the following chemotherapy agents: Obinutuzumab [name] [name])  To help prevent nausea and vomiting after your treatment, we encourage you to take your nausea medication  as prescribed.    If you develop nausea and vomiting that is not controlled by your nausea medication, call the clinic.   BELOW ARE SYMPTOMS THAT SHOULD BE REPORTED IMMEDIATELY:  *FEVER GREATER THAN 100.5 F  *CHILLS WITH OR WITHOUT FEVER  NAUSEA AND VOMITING THAT IS NOT CONTROLLED WITH YOUR NAUSEA MEDICATION  *UNUSUAL SHORTNESS OF BREATH  *UNUSUAL BRUISING OR BLEEDING  TENDERNESS IN MOUTH AND THROAT WITH OR WITHOUT PRESENCE OF ULCERS  *URINARY PROBLEMS  *BOWEL PROBLEMS  UNUSUAL RASH Items with * indicate a potential emergency and should be followed up as soon as possible.  Feel free to call the clinic should you have any questions or concerns. The clinic phone number is [phone].  Please show the [facility] at check-in to the Emergency Department and triage nurse.  Obinutuzumab injection What is this medicine? OBINUTUZUMAB (OH bi nue TOOZ ue mab) is a monoclonal antibody. It is used to treat chronic lymphocytic leukemia (CLL) and a type of non-Hodgkin lymphoma (NHL), follicular lymphoma. This medicine may be used for other purposes; ask your health care provider or pharmacist if you have questions. COMMON BRAND NAME(S): GAZYVA What should I tell my health care provider before I take this medicine? They need to know if you have any of these conditions:  infection (especially a virus infection such as hepatitis B virus)  lung or breathing disease  heart disease  take medicines that treat or prevent blood clots  an unusual or allergic reaction to obinutuzumab, other medicines, foods, dyes, or preservatives  pregnant or trying to get pregnant  breast-feeding How should I use this  medicine? This medicine is for infusion into a vein. It is given by a health care professional in a hospital or clinic setting. Talk to your pediatrician regarding the use of this medicine in children. Special care may be needed. Overdosage: If you think you have taken too much of this medicine contact a poison control center or emergency room at once. NOTE: This medicine is only for you. Do not share this medicine with others. What if I miss a dose? Keep appointments for follow-up doses as directed. It is important not to miss your dose. Call your doctor or health care professional if you are unable to keep an appointment. What may interact with this medicine?  live virus vaccines This list may not describe all possible interactions. Give your health care provider a list of all the medicines, herbs, non-prescription drugs, or dietary supplements you use. Also tell them if you smoke, drink alcohol, or use illegal drugs. Some items may interact with your medicine. What should I watch for while using this medicine? Report any side effects that you notice during your treatment right away, such as changes in your breathing, fever, chills, dizziness or lightheadedness. These effects are more common with the first dose. Visit your prescriber or health care professional for checks on your progress. You will need to have regular blood work. Report any other side effects. The side effects of this medicine can continue after you finish your treatment. Continue your course of treatment even though you feel ill unless your doctor tells you to stop. Call your doctor or health care professional for advice if you get a fever, chills or sore  throat, or other symptoms of a cold or flu. Do not treat yourself. This drug decreases your body's ability to fight infections. Try to avoid being around people who are sick. This medicine may increase your risk to bruise or bleed. Call your doctor or health care professional if  you notice any unusual bleeding. Do not become pregnant while taking this medicine or for 6 months after stopping it. Women should inform their doctor if they wish to become pregnant or think they might be pregnant. There is a potential for serious side effects to an unborn child. Talk to your health care professional or pharmacist for more information. Do not breast-feed an infant while taking this medicine or for 6 months after stopping it. What side effects may I notice from receiving this medicine? Side effects that you should report to your doctor or health care professional as soon as possible:  allergic reactions like skin rash, itching or hives, swelling of the face, lips, or tongue  breathing problems  changes in vision  chest pain or chest tightness  confusion  dizziness  loss of balance or coordination  low blood counts - this medicine may decrease the number of white blood cells, red blood cells and platelets. You may be at increased risk for infections and bleeding.  signs of decreased platelets or bleeding - bruising, pinpoint red spots on the skin, black, tarry stools, blood in the urine  signs of infection - fever or chills, cough, sore throat, pain or trouble passing urine  signs and symptoms of liver injury like dark yellow or brown urine; general ill feeling or flu-like symptoms; light-colored stools; loss of appetite; nausea; right upper belly pain; unusually weak or tired; yellowing of the eyes or skin  trouble speaking or understanding  trouble walking  vomiting Side effects that usually do not require medical attention (report to your doctor or health care professional if they continue or are bothersome):  constipation  joint pain  muscle pain This list may not describe all possible side effects. Call your doctor for medical advice about side effects. You may report side effects to FDA at 1-800-FDA-1088. Where should I keep my medicine? This drug is  only given in a hospital or clinic and will not be stored at home. NOTE: This sheet is a summary. It may not cover all possible information. If you have questions about this medicine, talk to your doctor, pharmacist, or health care provider.  2021 [employer]vier/Gold Standard (2019-03-27 15:34:53)

## 2021-03-06 NOTE — Progress Notes (Signed)
Patient began experiencing acute respiratory distress at 11:08 while undergoing Cycle 1 Day 1 Gazyva infusion. Infusion immediately stopped and patient placed on 2 L oxygen via nasal cannula. Pt received emergency medications per protocol and Dr. Irene Limbo notified. Dr. Irene Limbo to bedside to assess patient. Vital signs retaken. Received verbal orders for an EKG to be obtained. EKG obtained and given to Dr. Irene Limbo to assess results.  Patient feeling "better". Vital signs retaken at 11:40 and remained stable.  Dr. Irene Limbo reassessed patient at 11:50. Patient is to remain for another 15-20 minutes before being discharged. Patient agreeable to change in treatment plan. Transportation set up for patient and VS retaken prior to discharge.

## 2021-03-10 ENCOUNTER — Other Ambulatory Visit: Payer: Medicare Other

## 2021-03-10 ENCOUNTER — Other Ambulatory Visit: Payer: Self-pay | Admitting: Family Medicine

## 2021-03-10 ENCOUNTER — Ambulatory Visit: Payer: Medicare Other

## 2021-03-16 ENCOUNTER — Other Ambulatory Visit: Payer: Medicare Other

## 2021-03-16 ENCOUNTER — Ambulatory Visit: Payer: Medicare Other | Admitting: Hematology

## 2021-03-16 ENCOUNTER — Ambulatory Visit: Payer: Medicare Other

## 2021-03-17 MED FILL — Dexamethasone Sodium Phosphate Inj 100 MG/10ML: INTRAMUSCULAR | Qty: 2 | Status: AC

## 2021-03-17 NOTE — Progress Notes (Incomplete)
HEMATOLOGY/ONCOLOGY CLINIC NOTE  Date of Service: 03/17/21    Patient Care Team: Danna Hefty, DO as PCP - General (Family Medicine) Sueanne Margarita, MD as PCP - Cardiology (Cardiology)  CHIEF COMPLAINTS/PURPOSE OF CONSULTATION:  F/u for CLL   HISTORY OF PRESENTING ILLNESS:  [name] [name] is a wonderful 75 y.o. female smoker with history of CLL who has been referred to [place] by Dr Tarry Kos, Archie Endo, DO at [facility] for evaluation and management of elevated WBC/lymphocytes.   She is accompanied by her husband. She reported to ED on 11/27/2017 with cough and abdominal pain with COPD exacerbation. Her labs on this day showed WBC elevated at 62.5. She has not previously seen a hematologist. She reports a plan to quit smoking on the first day of the new year. She reports worsening decrease in balance and states she has a cane and walker that she uses as needed. She was taking Prednisone.  No weight loss/fevers/chills/night sweats/ palpable lumps or bumps.  On review of systems, pt reports back pain, change in balance, abdominal pain, weight loss and denies changes in BM, fever and any other accompanying symptoms.   INTERVAL HISTORY: [name] [name] is here for management and evaluation of her Chronic Leukocytic Leukemia. The patient's last visit with [place] was on 03/05/2021. The pt reports that she is doing well overall.  The pt reports ***  Lab results today 03/18/2021 of CBC w/diff and CMP is as follows: all values are WNL except for ***  On review of systems, pt reports *** and denies *** and any other symptoms.  MEDICAL HISTORY:  Past Medical History:  Diagnosis Date  . Allergy    environmental  . Asthma   . CKD (chronic kidney disease)   . CLL (chronic lymphoblastic leukemia)   . Constipation 11/15/2011  . COPD (chronic obstructive pulmonary disease) ([place])   . Diabetes mellitus without complication ([place])   . GERD (gastroesophageal reflux disease)   .  Headache    migraines  . Heart murmur   . Hyperlipidemia   . Hypertension   . PVD (peripheral vascular disease) ([place])   . Stroke [facility]) 1998   in 1998 due to tumor-right side    SURGICAL HISTORY: Past Surgical History:  Procedure Laterality Date  . ABDOMINAL AORTOGRAM W/LOWER EXTREMITY Bilateral 08/12/2020   Procedure: ABDOMINAL AORTOGRAM W/BILATERAL LOWER EXTREMITY RUNOFF;  Surgeon: Serafina Mitchell, MD;  Location: [place] [facility];  Service: Cardiovascular;  Laterality: Bilateral;  . ABI  02/2012   ABI <0.65 BL 02/2012  . BRAIN MENINGIOMA EXCISION    . Bypass grafting of RLE for PAD claudication     . CARDIAC CATHETERIZATION  04/09/208  . CESAREAN SECTION     1974, 78 ,80  . CHOLECYSTECTOMY, LAPAROSCOPIC    . COLONOSCOPY  2014   [place], [place]  . ENDARTERECTOMY FEMORAL Right 10/03/2020   Procedure: RIGHT FEMORAL ENDARTERECTOMY WITH VEIN PATCH ANGIOPLASTY;  Surgeon: Serafina Mitchell, MD;  Location: [facility];  Service: Vascular;  Laterality: Right;  . ESOPHAGEAL DILATION    . FEMORAL-POPLITEAL BYPASS GRAFT Right 10/03/2020   Procedure: RIGHT FEMORAL- BELOW KNEE POPLITEAL BYPASS USING GORE PROPATEN 6MM GRAFT;  Surgeon: Serafina Mitchell, MD;  Location: [place];  Service: Vascular;  Laterality: Right;  . PATCH ANGIOPLASTY  10/03/2020   Procedure: VEIN PATCH ANGIOPLASTY;  Surgeon: Serafina Mitchell, MD;  Location: [place];  Service: Vascular;;    SOCIAL HISTORY: Social History  Socioeconomic History  . Marital status: Married    Spouse name: Not on file  . Number of children: 3  . Years of education: Not on file  . Highest education level: Not on file  Occupational History  . Occupation: DISABLED    Employer: DISABLED  Tobacco Use  . Smoking status: Current Every Day Smoker    Packs/day: 0.25    Years: 30.00    Pack years: 7.50    Types: Cigarettes    Start date: 01/01/1976  . Smokeless tobacco: Never Used  . Tobacco comment: ~ 3 cigarettes / day  Vaping Use  . Vaping Use: Never  used  Substance and Sexual Activity  . Alcohol use: No    Alcohol/week: 0.0 standard drinks  . Drug use: No  . Sexual activity: Yes  Other Topics Concern  . Not on file  Social History Narrative   Health Care POA:    Emergency Contact: Mada Sadik [phone] (c)   End of Life Plan:    Who lives with you: Lives with husband   Any pets: none   Diet: Patient lacks financial resources for much food. Pt reports eating what is available.   Exercise: Patient does not have an exercise plan.   Seatbelts: Patient reports wearing seatbelt when in vehicle.    [name] Exposure/Protection:   Hobbies: Bowling, computer games, Bingo      Has financial difficulties and transportation issues as she and her husband share transportation       Social Determinants of Radio broadcast assistant Strain: Not on file  Food Insecurity: Not on file  Transportation Needs: Not on file  Physical Activity: Not on file  Stress: Not on file  Social Connections: Not on file  Intimate Partner Violence: Not on file    FAMILY HISTORY: Family History  Problem Relation Age of Onset  . Heart disease Mother   . Asthma Mother   . Cancer Mother        uterine   . Depression Mother   . Heart attack Mother 20  . Hyperlipidemia Mother   . Hypertension Mother   . Stroke Mother   . Kidney disease Mother   . Heart attack Sister   . Stroke Sister   . Depression Sister   . Diabetes Sister   . Hyperlipidemia Sister   . Depression Sister   . Diabetes Sister   . Hyperlipidemia Sister   . HIV/AIDS Sister   . Diabetes Brother   . Asthma Brother   . Hypertension Brother   . Cancer Maternal Aunt        lung  . Heart disease Maternal Grandmother   . Heart attack Maternal Grandmother   . Cancer Brother        colon  . HIV/AIDS Brother   . Colon cancer Neg Hx     ALLERGIES:  is allergic to no healthtouch food allergies, aspirin, and baclofen.  MEDICATIONS:  Current Outpatient Medications  Medication Sig  Dispense Refill  . famotidine (PEPCID) 20 MG tablet Take 2 tablets by mouth every day 60 tablet 11  . montelukast (SINGULAIR) 10 MG tablet Take 1 tablet (10 mg total) by mouth at bedtime. 30 tablet 11  . acetaminophen (TYLENOL) 500 MG tablet Take 500-1,000 mg by mouth every 6 (six) hours as needed for moderate pain.     Marland Kitchen albuterol (ACCUNEB) 0.63 MG/3ML nebulizer solution Inhale 1 vial via nebulizer every 6 hours for wheezing as needed (Patient taking differently: Take  1 ampule by nebulization every 6 (six) hours as needed for wheezing or shortness of breath.) 75 mL 11  . albuterol (VENTOLIN HFA) 108 (90 Base) MCG/ACT inhaler Inhale 2 puffs by mouth every 6 hours as needed for wheezing or shortness of breath (Patient taking differently: Inhale 2 puffs into the lungs every 6 (six) hours as needed for wheezing or shortness of breath.) 8.5 g 11  . Alcohol Swabs (ALCOHOL PADS) 70 % PADS SMARTSIG:Pledget(s) Topical Daily    . allopurinol (ZYLOPRIM) 300 MG tablet Take 0.5 tablets (150 mg total) by mouth daily. 30 tablet 0  . amLODipine (NORVASC) 5 MG tablet Take 5 mg by mouth daily.    Marland Kitchen atorvastatin (LIPITOR) 80 MG tablet Take 1 tablet (80 mg total) by mouth daily. 90 tablet 3  . b complex vitamins capsule Take 1 capsule by mouth daily. 30 capsule 5  . Blood Glucose Calibration (OT ULTRA/FASTTK CNTRL SOLN) SOLN See admin instructions.    . Blood Glucose Monitoring Suppl (ONETOUCH VERIO) w/Device KIT 1 Device by Does not apply route daily. 1 kit 0  . BREO ELLIPTA 100-25 MCG/INH AEPB Inhale 1 puff by mouth every day 60 each 11  . clopidogrel (PLAVIX) 75 MG tablet Take 1 tablet by mouth every day 30 tablet 11  . cyclobenzaprine (FLEXERIL) 10 MG tablet Take 1 tablet by mouth 3 times a day as needed for muscle spasms. 90 tablet 11  . diclofenac Sodium (VOLTAREN) 1 % GEL Apply 2 grams topically four times a day 100 g 11  . Dulaglutide (TRULICITY) 3.66 QH/4.7ML SOPN Inject 0.75 mg into the skin once a week. 2  mL 0  . DULoxetine (CYMBALTA) 60 MG capsule Take 1 capsule (60 mg total) by mouth daily. 90 capsule 3  . empagliflozin (JARDIANCE) 10 MG TABS tablet Take 1 tablet (10 mg total) by mouth daily. 90 tablet 3  . ergocalciferol (VITAMIN D2) 1.25 MG (50000 UT) capsule Take 50,000 Units by mouth every Monday.    . furosemide (LASIX) 20 MG tablet Take 1 tablet (20 mg total) by mouth daily. 30 tablet 3  . gabapentin (NEURONTIN) 300 MG capsule Take 1 capsule (300 mg total) by mouth 3 (three) times daily. X 1 week, then 600 mg BID- long term 600 mg 2x/day-  for nerve pain (Patient taking differently: Take 300 mg by mouth 3 (three) times daily.) 360 capsule 3  . glucose blood (ONETOUCH VERIO) test strip Use to check blood sugar once a day 100 each 12  . hydrALAZINE (APRESOLINE) 50 MG tablet Take 50 mg by mouth 3 (three) times daily.    . iron polysaccharides (NIFEREX) 150 MG capsule Take 1 capsule (150 mg total) by mouth daily. 30 capsule 5  . JARDIANCE 25 MG TABS tablet Take 25 mg by mouth daily.    Elmore Guise Devices (EASY MINI EJECT LANCING DEVICE) MISC daily.    . Lancets (ONETOUCH ULTRASOFT) lancets Use to check blood sugar once a day 100 each 12  . lidocaine (XYLOCAINE) 5 % ointment Apply 1 application topically as needed. Apply to R>L feet when burning up to 4x/day as needed 50 g 3  . metFORMIN (GLUCOPHAGE) 500 MG tablet Take 500 mg by mouth daily.    . nitroGLYCERIN (NITROSTAT) 0.4 MG SL tablet Dissolve 1 tablet under tongue every 5 minutes, up to 3 doses for chest pain (Patient taking differently: Place 0.4 mg under the tongue every 5 (five) minutes as needed for chest pain. up to 3 doses)  25 tablet 11  . ondansetron (ZOFRAN) 8 MG tablet Take 1 tablet (8 mg total) by mouth 2 (two) times daily as needed (Nausea or vomiting). 30 tablet 1  . prochlorperazine (COMPAZINE) 10 MG tablet Take 1 tablet (10 mg total) by mouth every 6 (six) hours as needed (Nausea or vomiting). 30 tablet 1  . traMADol (ULTRAM) 50  MG tablet Take 1 tablet by mouth twice daily as needed for moderate to severe pain 30 tablet 3   No current facility-administered medications for this visit.    REVIEW OF SYSTEMS:   10 Point review of Systems was done is negative except as noted above.  PHYSICAL EXAMINATION: ECOG FS:2 - Symptomatic, <50% confined to bed  There were no vitals filed for this visit. Wt Readings from Last 3 Encounters:  03/06/21 143 lb 4 oz (65 kg)  03/05/21 141 lb 14.4 oz (64.4 kg)  02/23/21 141 lb (64 kg)   There is no height or weight on file to calculate BMI.     *** GENERAL:alert, in no acute distress and comfortable SKIN: no acute rashes, no significant lesions EYES: conjunctiva are pink and non-injected, sclera anicteric OROPHARYNX: MMM, no exudates, no oropharyngeal erythema or ulceration NECK: supple, no JVD LYMPH:  no palpable lymphadenopathy in the cervical, axillary or inguinal regions LUNGS: clear to auscultation b/l with normal respiratory effort HEART: regular rate & rhythm ABDOMEN:  normoactive bowel sounds , non tender, not distended. Extremity: no pedal edema PSYCH: alert & oriented x 3 with fluent speech NEURO: no focal motor/sensory deficits  LABORATORY DATA:  I have reviewed the data as listed   CBC Latest Ref Rng & Units 03/05/2021 02/06/2021 12/05/2020  WBC 4.0 - 10.5 K/uL 190.0(HH) 212.1(HH) 180.8(HH)  Hemoglobin 12.0 - 15.0 g/dL 9.6(L) 8.5(L) 8.6(L)  Hematocrit 36.0 - 46.0 % 34.0(L) 30.6(L) 30.2(L)  Platelets 150 - 400 K/uL 186 202 256    . CMP Latest Ref Rng & Units 03/05/2021 02/06/2021 01/27/2021  Glucose 70 - 99 mg/dL 111(H) 126(H) 174(H)  BUN 8 - 23 mg/dL '12 12 13  ' Creatinine 0.44 - 1.00 mg/dL 1.34(H) 1.71(H) 1.89(H)  Sodium 135 - 145 mmol/L 142 139 144  Potassium 3.5 - 5.1 mmol/L 4.1 4.0 4.6  Chloride 98 - 111 mmol/L 109 103 103  CO2 22 - 32 mmol/L '28 27 29  ' Calcium 8.9 - 10.3 mg/dL 9.1 9.2 9.5  Total Protein 6.5 - 8.1 g/dL 6.4(L) 6.6 -  Total Bilirubin 0.3  - 1.2 mg/dL 0.4 0.3 -  Alkaline Phos 38 - 126 U/L 260(H) 175(H) -  AST 15 - 41 U/L 28 19 -  ALT 0 - 44 U/L 29 11 -   .Marland Kitchen Lab Results  Component Value Date   IRON 37 (L) 02/06/2021   TIBC 424 02/06/2021   IRONPCTSAT 9 (L) 02/06/2021   (Iron and TIBC)  Lab Results  Component Value Date   FERRITIN 15 02/06/2021    Component     Latest Ref Rng & Units 02/06/2021  Vitamin B12     180 - 914 pg/mL 448  Haptoglobin     42 - 346 mg/dL 223   Lab Results  Component Value Date   LDH 445 (H) 02/06/2021        . Lab Results  Component Value Date   LDH 445 (H) 02/06/2021    RADIOGRAPHIC STUDIES: I have personally reviewed the radiological images as listed and agreed with the findings in the report. NM PET Image Initial (PI)  Skull Base To Thigh  Result Date: 03/02/2021 CLINICAL DATA:  Initial treatment strategy for CLL. EXAM: NUCLEAR MEDICINE PET SKULL BASE TO THIGH TECHNIQUE: 7.0 mCi F-18 FDG was injected intravenously. Full-ring PET imaging was performed from the skull base to thigh after the radiotracer. CT data was obtained and used for attenuation correction and anatomic localization. Fasting blood glucose: 122 mg/dl COMPARISON:  CT chest 01/26/2021 and CT abdomen pelvis 03/25/2020. FINDINGS: Mediastinal blood pool activity: SUV max 2.2 Liver activity: SUV max 3.6 NECK: Small level 2 lymph measure up to 6 mm on the right (4/30) with an SUV max of 2.4. Incidental CT findings: None. CHEST: Hypermetabolic axillary lymph nodes are seen bilaterally. Index left axillary lymph node measures 1.6 cm (4/51) with an SUV max of 4.0. No hypermetabolic mediastinal lymph nodes. There may be a hypermetabolic right hilar lymph node, SUV max 3.1. No hypermetabolic pulmonary nodules. Incidental CT findings: Atherosclerotic calcification of the aorta, aortic valve and coronary arteries. Pulmonic trunk is enlarged. Heart is at the upper limits of normal in size. No pericardial or pleural effusion.  Centrilobular emphysema. A few scattered pulmonary nodules are too small for PET resolution and distorted by respiratory motion. ABDOMEN/PELVIS: No abnormal hypermetabolism in the liver, adrenal glands, spleen or pancreas. Mildly hypermetabolic retroperitoneal lymph nodes. Index left periaortic lymph node measures 6 mm (4/105) with an SUV max of 2.4. Index right common iliac lymph node measures 8 mm (4/125) with an SUV max of 4.6. Index right external iliac lymph node measures 8 mm (4/144) with an SUV max of 2.4. Finally, index right inguinal lymph node measures 9 mm (4/160) with an SUV max of 4.3. An intermuscular fluid density lesion in the posterior left thigh measures 3.5 cm (4/177) with an SUV max of 4.0. Incidental CT findings: Liver, gallbladder, adrenal glands, kidneys, spleen, pancreas, stomach and bowel are grossly unremarkable. Cholecystectomy. Atherosclerotic calcification of the aorta. Bladder wall appears thickened but is under distended. SKELETON: No abnormal hypermetabolism. Incidental CT findings: A lipoma is seen in left quadriceps musculature. IMPRESSION: 1. Small, mildly hypermetabolic lymph nodes in the neck, axillary regions, possibly right hilum, abdomen and pelvis, consistent with the given history of CLL. 2. Intramuscular fluid density lesion in the posterior left thigh shows mild hypermetabolism and is of uncertain etiology. 3. Aortic atherosclerosis (ICD10-I70.0). Coronary artery calcification. 4. Enlarged pulmonic trunk, indicative of pulmonary arterial hypertension. 5.  Emphysema (ICD10-J43.9). Electronically Signed   By: Lorin Picket M.D.   On: 03/02/2021 14:16   VAS [place] ABI WITH/WO TBI  Result Date: 02/23/2021 LOWER EXTREMITY DOPPLER STUDY Indications: Peripheral artery disease. High Risk Factors: Hypertension, hyperlipidemia, Diabetes, current smoker.  Vascular Interventions: Right femoral to below knee popliteal bypass graft                         10/03/2020. Right SFA stent  2009 by Dr. Trula Slade. Left                         fem-pop BPG in [place]. Comparison Study: Increased right post operative ABI since prior exam of                   08/04/2020 Performing Technologist: Alvia Grove RVT  Examination Guidelines: A complete evaluation includes at minimum, Doppler waveform signals and systolic blood pressure reading at the level of bilateral brachial, anterior tibial, and posterior tibial arteries, when vessel segments are accessible. Bilateral testing is  considered an integral part of a complete examination. Photoelectric Plethysmograph (PPG) waveforms and toe systolic pressure readings are included as required and additional duplex testing as needed. Limited examinations for reoccurring indications may be performed as noted.  ABI Findings: +---------+------------------+-----+----------+--------+ Right    Rt Pressure (mmHg)IndexWaveform  Comment  +---------+------------------+-----+----------+--------+ Brachial 140                                       +---------+------------------+-----+----------+--------+ PTA      139               0.97 triphasic          +---------+------------------+-----+----------+--------+ DP       142               0.99 monophasic         +---------+------------------+-----+----------+--------+ Great Toe92                0.64 Abnormal           +---------+------------------+-----+----------+--------+ +---------+------------------+-----+----------+-------+ Left     Lt Pressure (mmHg)IndexWaveform  Comment +---------+------------------+-----+----------+-------+ Brachial 144                                      +---------+------------------+-----+----------+-------+ PTA      91                0.63 monophasic        +---------+------------------+-----+----------+-------+ DP       94                0.65 monophasic        +---------+------------------+-----+----------+-------+ Great Toe90                 0.62 Abnormal          +---------+------------------+-----+----------+-------+ +-------+-----------+-----------+------------+------------+ ABI/TBIToday's ABIToday's TBIPrevious ABIPrevious TBI +-------+-----------+-----------+------------+------------+ Right  0.99       0.64       0.55        0.32         +-------+-----------+-----------+------------+------------+ Left   0.65       0.63       0.56        0.47         +-------+-----------+-----------+------------+------------+   Summary: Right: Resting right ankle-brachial index is within normal range. No evidence of significant right lower extremity arterial disease. The right toe-brachial index is abnormal. Left: Resting left ankle-brachial index indicates moderate left lower extremity arterial disease. The left toe-brachial index is abnormal.  *See table(s) above for measurements and observations.  Electronically signed by Harold Barban MD on 02/23/2021 at 11:53:47 AM.    Final    VAS [place] LOWER EXTREMITY ARTERIAL DUPLEX  Result Date: 02/23/2021 LOWER EXTREMITY ARTERIAL DUPLEX STUDY Indications: Peripheral artery disease. Right femoral to popliteal bypass graft              placed 10/03/2020 High Risk Factors: Hypertension, hyperlipidemia, Diabetes, current smoker.  Current ABI: Right ABI 0.99 Left 0.65 Limitations: Body habitus and depth of graft. Comparison Study: No prior post operative exam Performing Technologist: Alvia Grove RVT  Examination Guidelines: A complete evaluation includes B-mode imaging, spectral Doppler, color Doppler, and power Doppler as needed of all accessible portions of each vessel. Bilateral testing is considered an integral part of a complete examination. Limited examinations for reoccurring indications  may be performed as noted.   Right Graft #1: +------------------+--------+--------+----------+--------+                   PSV cm/sStenosisWaveform  Comments  +------------------+--------+--------+----------+--------+ Inflow            252             monophasic         +------------------+--------+--------+----------+--------+ Prox Anastomosis  86              biphasic           +------------------+--------+--------+----------+--------+ Proximal Graft    131             monophasic         +------------------+--------+--------+----------+--------+ Mid Graft         89              monophasic         +------------------+--------+--------+----------+--------+ Distal Graft      104             monophasic         +------------------+--------+--------+----------+--------+ Distal Anastomosis173             monophasic         +------------------+--------+--------+----------+--------+ Outflow           150             monophasic         +------------------+--------+--------+----------+--------+   Summary: Right: Patent right femoral to femoral bypass graft with no visualized stenosis. The mid to distal thigh graft not well visualized due to depth.  See table(s) above for measurements and observations. Electronically signed by Harold Barban MD on 02/23/2021 at 11:55:19 AM.    Final    Surgical Pathology 12/13/2017   ASSESSMENT & PLAN:   [name] [name] is a wonderful 75 y.o. female with    1. Rai 1 Chronic Lymphocytic Leukemia  - Trisomy 12 mutation. No associated thrombocytopenia.  Mild anemia - likely from acute blood loss from significant epistaxis. No constitutional symptoms. Minimal LNaednopathy No splenomegaly  #2 .Lung nodules on CT in 12/2017. Rpt CT chest 08/09/2018 - stable  #3 Patient Active Problem List   Diagnosis Date Noted  . Counseling regarding advance care planning and goals of care 02/19/2021  . Iron deficiency anemia 02/12/2021  . Peripheral arterial disease ([place]) 10/03/2020  . (HFpEF) heart failure with preserved ejection fraction ([place]) 06/11/2020  . Venous insufficiency (chronic) (peripheral)  06/22/2019  . Pulmonary nodules 01/04/2018  . Mass of left lower leg 01/04/2018  . Chronic kidney disease (CKD), stage III (moderate) ([place]) 06/04/2016  . Chronic back pain 10/02/2015  . DM (diabetes mellitus), type 2 with peripheral vascular complications ([place]) 68/11/7516  . Chronic lymphocytic leukemia ([place]) 04/11/2009  . PERIPHERAL VASCULAR DISEASE 10/13/2007  . HYPERCHOLESTEROLEMIA 02/23/2007  . ANXIETY 02/23/2007  . TOBACCO DEPENDENCE 02/23/2007  . Essential hypertension 02/23/2007  . COPD (chronic obstructive pulmonary disease) ([place]) 02/23/2007  . GASTROESOPHAGEAL REFLUX, NO ESOPHAGITIS 02/23/2007   PLAN: -Discussed pt labwork today, 03/18/2021; ***   -Continue to wear compression socks and elevate legs to alleviate leg swelling.  -Advised pt there are four doses in first month, then we switch to once monthly. -Continue Allopurinol to decrease uric acid and control gout.  -Recommended pt drink 48-64 oz water daily. -Continue PO Iron and B-complex daily.  -Will see back ***.    FOLLOW UP: ***   The total time spent in the appointment was ***  minutes and more than 50% was on counseling and direct patient cares.    All of the patient's questions were answered with apparent satisfaction. The patient knows to call the clinic with any problems, questions or concerns.    Sullivan Lone MD [place] AAHIVMS [facility] Hematology/Oncology Physician [facility]  (Office):       [phone] (Work cell):  [phone] (Fax):           [phone]  I, Reinaldo Raddle, am acting as scribe for Dr. Sullivan Lone, MD.

## 2021-03-18 ENCOUNTER — Inpatient Hospital Stay: Payer: Medicare Other | Admitting: Hematology

## 2021-03-18 ENCOUNTER — Telehealth: Payer: Self-pay | Admitting: *Deleted

## 2021-03-18 ENCOUNTER — Telehealth: Payer: Self-pay | Admitting: Hematology

## 2021-03-18 ENCOUNTER — Inpatient Hospital Stay: Payer: Medicare Other

## 2021-03-18 ENCOUNTER — Ambulatory Visit: Payer: Medicare Other

## 2021-03-18 NOTE — Telephone Encounter (Signed)
Contacted patient. She missed lab and appt with Dr. Irene Limbo. Patient states she forgot about appt and asked to be r/s as soon as possible. She wants to discuss taking chemo again with Dr. Irene Limbo. Schedule message sent.

## 2021-03-18 NOTE — Telephone Encounter (Signed)
R/s todays appt per sch msg. Called and spoke with patient. Confirmed date and time

## 2021-03-23 ENCOUNTER — Ambulatory Visit: Payer: Medicare Other

## 2021-03-23 ENCOUNTER — Other Ambulatory Visit: Payer: Medicare Other

## 2021-03-25 ENCOUNTER — Other Ambulatory Visit: Payer: Medicare Other

## 2021-03-25 ENCOUNTER — Ambulatory Visit: Payer: Medicare Other

## 2021-03-26 NOTE — Progress Notes (Signed)
HEMATOLOGY/ONCOLOGY CLINIC NOTE  Date of Service: 03/26/21    Patient Care Team: Danna Hefty, DO as PCP - General (Family Medicine) Sueanne Margarita, MD as PCP - Cardiology (Cardiology)  CHIEF COMPLAINTS/PURPOSE OF CONSULTATION:  F/u for CLL   HISTORY OF PRESENTING ILLNESS:  [name] [name] is a wonderful 75 y.o. female smoker with history of CLL who has been referred to [place] by Dr Tarry Kos, Archie Endo, DO at [facility] for evaluation and management of elevated WBC/lymphocytes.   She is accompanied by her husband. She reported to ED on 11/27/2017 with cough and abdominal pain with COPD exacerbation. Her labs on this day showed WBC elevated at 62.5. She has not previously seen a hematologist. She reports a plan to quit smoking on the first day of the new year. She reports worsening decrease in balance and states she has a cane and walker that she uses as needed. She was taking Prednisone.  No weight loss/fevers/chills/night sweats/ palpable lumps or bumps.  On review of systems, pt reports back pain, change in balance, abdominal pain, weight loss and denies changes in BM, fever and any other accompanying symptoms.   INTERVAL HISTORY:  [name] [name] is here for management and evaluation of her Chronic Leukocytic Leukemia. The patient's last visit with [place] was on 03/05/2021. The pt reports that she is doing well overall. We are joined today by her husband.  The pt reports no new symptoms or concerns. The pt notes that she is unsure if she has been taking the Allopurinol due to many changes in medication and unsurety regarding all her medications whether to take or not to take.  Lab results today 03/27/2021 of CBC w/diff and CMP is as follows: all values are WNL except for WBC of 176.0K, RBC of 3.84, Hgb of 9.5, HCT of 33.2, MCHC of 28.6, MCH of 24.7, RDW of 30.9, Glucose of 129, Creatinine of 1.36, Calcium of 8.8, Total Protein of 6.3, Alkaline Phosphatase of 289, GFR est of  41. 03/27/2021 Uric Acid of 7.9.  On review of systems, pt reports leg swelling and denies abdominal pain and any other symptoms.  MEDICAL HISTORY:  Past Medical History:  Diagnosis Date  . Allergy    environmental  . Asthma   . CKD (chronic kidney disease)   . CLL (chronic lymphoblastic leukemia)   . Constipation 11/15/2011  . COPD (chronic obstructive pulmonary disease) ([place])   . Diabetes mellitus without complication ([place])   . GERD (gastroesophageal reflux disease)   . Headache    migraines  . Heart murmur   . Hyperlipidemia   . Hypertension   . PVD (peripheral vascular disease) ([place])   . Stroke [facility]) 1998   in 1998 due to tumor-right side    SURGICAL HISTORY: Past Surgical History:  Procedure Laterality Date  . ABDOMINAL AORTOGRAM W/LOWER EXTREMITY Bilateral 08/12/2020   Procedure: ABDOMINAL AORTOGRAM W/BILATERAL LOWER EXTREMITY RUNOFF;  Surgeon: Serafina Mitchell, MD;  Location: [place] [facility];  Service: Cardiovascular;  Laterality: Bilateral;  . ABI  02/2012   ABI <0.65 BL 02/2012  . BRAIN MENINGIOMA EXCISION    . Bypass grafting of RLE for PAD claudication     . CARDIAC CATHETERIZATION  04/09/208  . CESAREAN SECTION     1974, 78 ,80  . CHOLECYSTECTOMY, LAPAROSCOPIC    . COLONOSCOPY  2014   [place], [place]  . ENDARTERECTOMY FEMORAL Right 10/03/2020   Procedure: RIGHT FEMORAL ENDARTERECTOMY WITH VEIN PATCH  ANGIOPLASTY;  Surgeon: Serafina Mitchell, MD;  Location: [facility] OR;  Service: Vascular;  Laterality: Right;  . ESOPHAGEAL DILATION    . FEMORAL-POPLITEAL BYPASS GRAFT Right 10/03/2020   Procedure: RIGHT FEMORAL- BELOW KNEE POPLITEAL BYPASS USING GORE PROPATEN 6MM GRAFT;  Surgeon: Serafina Mitchell, MD;  Location: [place];  Service: Vascular;  Laterality: Right;  . PATCH ANGIOPLASTY  10/03/2020   Procedure: VEIN PATCH ANGIOPLASTY;  Surgeon: Serafina Mitchell, MD;  Location: [facility] OR;  Service: Vascular;;    SOCIAL HISTORY: Social History   Socioeconomic History  . Marital  status: Married    Spouse name: Not on file  . Number of children: 3  . Years of education: Not on file  . Highest education level: Not on file  Occupational History  . Occupation: DISABLED    Employer: DISABLED  Tobacco Use  . Smoking status: Current Every Day Smoker    Packs/day: 0.25    Years: 30.00    Pack years: 7.50    Types: Cigarettes    Start date: 01/01/1976  . Smokeless tobacco: Never Used  . Tobacco comment: ~ 3 cigarettes / day  Vaping Use  . Vaping Use: Never used  Substance and Sexual Activity  . Alcohol use: No    Alcohol/week: 0.0 standard drinks  . Drug use: No  . Sexual activity: Yes  Other Topics Concern  . Not on file  Social History Narrative   Health Care POA:    Emergency Contact: Annalisia Ingber [phone] (c)   End of Life Plan:    Who lives with you: Lives with husband   Any pets: none   Diet: Patient lacks financial resources for much food. Pt reports eating what is available.   Exercise: Patient does not have an exercise plan.   Seatbelts: Patient reports wearing seatbelt when in vehicle.    [name] Exposure/Protection:   Hobbies: Bowling, computer games, Bingo      Has financial difficulties and transportation issues as she and her husband share transportation       Social Determinants of Radio broadcast assistant Strain: Not on file  Food Insecurity: Not on file  Transportation Needs: Not on file  Physical Activity: Not on file  Stress: Not on file  Social Connections: Not on file  Intimate Partner Violence: Not on file    FAMILY HISTORY: Family History  Problem Relation Age of Onset  . Heart disease Mother   . Asthma Mother   . Cancer Mother        uterine   . Depression Mother   . Heart attack Mother 31  . Hyperlipidemia Mother   . Hypertension Mother   . Stroke Mother   . Kidney disease Mother   . Heart attack Sister   . Stroke Sister   . Depression Sister   . Diabetes Sister   . Hyperlipidemia Sister   . Depression  Sister   . Diabetes Sister   . Hyperlipidemia Sister   . HIV/AIDS Sister   . Diabetes Brother   . Asthma Brother   . Hypertension Brother   . Cancer Maternal Aunt        lung  . Heart disease Maternal Grandmother   . Heart attack Maternal Grandmother   . Cancer Brother        colon  . HIV/AIDS Brother   . Colon cancer Neg Hx     ALLERGIES:  is allergic to no healthtouch food allergies, aspirin, and baclofen.  MEDICATIONS:  Current Outpatient Medications  Medication Sig Dispense Refill  . famotidine (PEPCID) 20 MG tablet Take 2 tablets by mouth every day 60 tablet 11  . montelukast (SINGULAIR) 10 MG tablet Take 1 tablet (10 mg total) by mouth at bedtime. 30 tablet 11  . acetaminophen (TYLENOL) 500 MG tablet Take 500-1,000 mg by mouth every 6 (six) hours as needed for moderate pain.     Marland Kitchen albuterol (ACCUNEB) 0.63 MG/3ML nebulizer solution Inhale 1 vial via nebulizer every 6 hours for wheezing as needed (Patient taking differently: Take 1 ampule by nebulization every 6 (six) hours as needed for wheezing or shortness of breath.) 75 mL 11  . albuterol (VENTOLIN HFA) 108 (90 Base) MCG/ACT inhaler Inhale 2 puffs by mouth every 6 hours as needed for wheezing or shortness of breath (Patient taking differently: Inhale 2 puffs into the lungs every 6 (six) hours as needed for wheezing or shortness of breath.) 8.5 g 11  . Alcohol Swabs (ALCOHOL PADS) 70 % PADS SMARTSIG:Pledget(s) Topical Daily    . allopurinol (ZYLOPRIM) 300 MG tablet Take 0.5 tablets (150 mg total) by mouth daily. 30 tablet 0  . amLODipine (NORVASC) 5 MG tablet Take 5 mg by mouth daily.    Marland Kitchen atorvastatin (LIPITOR) 80 MG tablet Take 1 tablet (80 mg total) by mouth daily. 90 tablet 3  . b complex vitamins capsule Take 1 capsule by mouth daily. 30 capsule 5  . Blood Glucose Calibration (OT ULTRA/FASTTK CNTRL SOLN) SOLN See admin instructions.    . Blood Glucose Monitoring Suppl (ONETOUCH VERIO) w/Device KIT 1 Device by Does not  apply route daily. 1 kit 0  . BREO ELLIPTA 100-25 MCG/INH AEPB Inhale 1 puff by mouth every day 60 each 11  . clopidogrel (PLAVIX) 75 MG tablet Take 1 tablet by mouth every day 30 tablet 11  . cyclobenzaprine (FLEXERIL) 10 MG tablet Take 1 tablet by mouth 3 times a day as needed for muscle spasms. 90 tablet 11  . diclofenac Sodium (VOLTAREN) 1 % GEL Apply 2 grams topically four times a day 100 g 11  . Dulaglutide (TRULICITY) 8.09 XI/3.3AS SOPN Inject 0.75 mg into the skin once a week. 2 mL 0  . DULoxetine (CYMBALTA) 60 MG capsule Take 1 capsule (60 mg total) by mouth daily. 90 capsule 3  . empagliflozin (JARDIANCE) 10 MG TABS tablet Take 1 tablet (10 mg total) by mouth daily. 90 tablet 3  . ergocalciferol (VITAMIN D2) 1.25 MG (50000 UT) capsule Take 50,000 Units by mouth every Monday.    . furosemide (LASIX) 20 MG tablet Take 1 tablet (20 mg total) by mouth daily. 30 tablet 3  . gabapentin (NEURONTIN) 300 MG capsule Take 1 capsule (300 mg total) by mouth 3 (three) times daily. X 1 week, then 600 mg BID- long term 600 mg 2x/day-  for nerve pain (Patient taking differently: Take 300 mg by mouth 3 (three) times daily.) 360 capsule 3  . glucose blood (ONETOUCH VERIO) test strip Use to check blood sugar once a day 100 each 12  . hydrALAZINE (APRESOLINE) 50 MG tablet Take 50 mg by mouth 3 (three) times daily.    . iron polysaccharides (NIFEREX) 150 MG capsule Take 1 capsule (150 mg total) by mouth daily. 30 capsule 5  . JARDIANCE 25 MG TABS tablet Take 25 mg by mouth daily.    Elmore Guise Devices (EASY MINI EJECT LANCING DEVICE) MISC daily.    . Lancets (ONETOUCH ULTRASOFT) lancets Use to check blood sugar  once a day 100 each 12  . lidocaine (XYLOCAINE) 5 % ointment Apply 1 application topically as needed. Apply to R>L feet when burning up to 4x/day as needed 50 g 3  . metFORMIN (GLUCOPHAGE) 500 MG tablet Take 500 mg by mouth daily.    . nitroGLYCERIN (NITROSTAT) 0.4 MG SL tablet Dissolve 1 tablet under  tongue every 5 minutes, up to 3 doses for chest pain (Patient taking differently: Place 0.4 mg under the tongue every 5 (five) minutes as needed for chest pain. up to 3 doses) 25 tablet 11  . ondansetron (ZOFRAN) 8 MG tablet Take 1 tablet (8 mg total) by mouth 2 (two) times daily as needed (Nausea or vomiting). 30 tablet 1  . prochlorperazine (COMPAZINE) 10 MG tablet Take 1 tablet (10 mg total) by mouth every 6 (six) hours as needed (Nausea or vomiting). 30 tablet 1  . traMADol (ULTRAM) 50 MG tablet Take 1 tablet by mouth twice daily as needed for moderate to severe pain 30 tablet 3   No current facility-administered medications for this visit.    REVIEW OF SYSTEMS:   10 Point review of Systems was done is negative except as noted above.  PHYSICAL EXAMINATION: ECOG FS:2 - Symptomatic, <50% confined to bed  There were no vitals filed for this visit. Wt Readings from Last 3 Encounters:  03/06/21 143 lb 4 oz (65 kg)  03/05/21 141 lb 14.4 oz (64.4 kg)  02/23/21 141 lb (64 kg)   Body mass index is 26.94 kg/m.     Exam was given in a chair.  GENERAL:alert, in no acute distress and comfortable SKIN: no acute rashes, no significant lesions EYES: conjunctiva are pink and non-injected, sclera anicteric OROPHARYNX: MMM, no exudates, no oropharyngeal erythema or ulceration NECK: supple, no JVD LYMPH:  no palpable lymphadenopathy in the cervical, axillary or inguinal regions LUNGS: clear to auscultation b/l with normal respiratory effort HEART: regular rate & rhythm ABDOMEN:  normoactive bowel sounds , non tender, not distended. Extremity: no pedal edema PSYCH: alert & oriented x 3 with fluent speech NEURO: no focal motor/sensory deficits  LABORATORY DATA:  I have reviewed the data as listed   CBC Latest Ref Rng & Units 03/27/2021 03/05/2021 02/06/2021  WBC 4.0 - 10.5 K/uL 176.0(HH) 190.0(HH) 212.1(HH)  Hemoglobin 12.0 - 15.0 g/dL 9.5(L) 9.6(L) 8.5(L)  Hematocrit 36.0 - 46.0 % 33.2(L)  34.0(L) 30.6(L)  Platelets 150 - 400 K/uL 172 186 202    . CMP Latest Ref Rng & Units 03/27/2021 03/05/2021 02/06/2021  Glucose 70 - 99 mg/dL 129(H) 111(H) 126(H)  BUN 8 - 23 mg/dL '9 12 12  ' Creatinine 0.44 - 1.00 mg/dL 1.36(H) 1.34(H) 1.71(H)  Sodium 135 - 145 mmol/L 143 142 139  Potassium 3.5 - 5.1 mmol/L 3.8 4.1 4.0  Chloride 98 - 111 mmol/L 103 109 103  CO2 22 - 32 mmol/L '28 28 27  ' Calcium 8.9 - 10.3 mg/dL 8.8(L) 9.1 9.2  Total Protein 6.5 - 8.1 g/dL 6.3(L) 6.4(L) 6.6  Total Bilirubin 0.3 - 1.2 mg/dL 0.4 0.4 0.3  Alkaline Phos 38 - 126 U/L 289(H) 260(H) 175(H)  AST 15 - 41 U/L '19 28 19  ' ALT 0 - 44 U/L '14 29 11   ' .. Lab Results  Component Value Date   IRON 37 (L) 02/06/2021   TIBC 424 02/06/2021   IRONPCTSAT 9 (L) 02/06/2021   (Iron and TIBC)  Lab Results  Component Value Date   FERRITIN 15 02/06/2021    Component  Latest Ref Rng & Units 02/06/2021  Vitamin B12     180 - 914 pg/mL 448  Haptoglobin     42 - 346 mg/dL 223   Lab Results  Component Value Date   LDH 445 (H) 02/06/2021        . Lab Results  Component Value Date   LDH 445 (H) 02/06/2021    RADIOGRAPHIC STUDIES: I have personally reviewed the radiological images as listed and agreed with the findings in the report. NM PET Image Initial (PI) Skull Base To Thigh  Result Date: 03/02/2021 CLINICAL DATA:  Initial treatment strategy for CLL. EXAM: NUCLEAR MEDICINE PET SKULL BASE TO THIGH TECHNIQUE: 7.0 mCi F-18 FDG was injected intravenously. Full-ring PET imaging was performed from the skull base to thigh after the radiotracer. CT data was obtained and used for attenuation correction and anatomic localization. Fasting blood glucose: 122 mg/dl COMPARISON:  CT chest 01/26/2021 and CT abdomen pelvis 03/25/2020. FINDINGS: Mediastinal blood pool activity: SUV max 2.2 Liver activity: SUV max 3.6 NECK: Small level 2 lymph measure up to 6 mm on the right (4/30) with an SUV max of 2.4. Incidental CT findings:  None. CHEST: Hypermetabolic axillary lymph nodes are seen bilaterally. Index left axillary lymph node measures 1.6 cm (4/51) with an SUV max of 4.0. No hypermetabolic mediastinal lymph nodes. There may be a hypermetabolic right hilar lymph node, SUV max 3.1. No hypermetabolic pulmonary nodules. Incidental CT findings: Atherosclerotic calcification of the aorta, aortic valve and coronary arteries. Pulmonic trunk is enlarged. Heart is at the upper limits of normal in size. No pericardial or pleural effusion. Centrilobular emphysema. A few scattered pulmonary nodules are too small for PET resolution and distorted by respiratory motion. ABDOMEN/PELVIS: No abnormal hypermetabolism in the liver, adrenal glands, spleen or pancreas. Mildly hypermetabolic retroperitoneal lymph nodes. Index left periaortic lymph node measures 6 mm (4/105) with an SUV max of 2.4. Index right common iliac lymph node measures 8 mm (4/125) with an SUV max of 4.6. Index right external iliac lymph node measures 8 mm (4/144) with an SUV max of 2.4. Finally, index right inguinal lymph node measures 9 mm (4/160) with an SUV max of 4.3. An intermuscular fluid density lesion in the posterior left thigh measures 3.5 cm (4/177) with an SUV max of 4.0. Incidental CT findings: Liver, gallbladder, adrenal glands, kidneys, spleen, pancreas, stomach and bowel are grossly unremarkable. Cholecystectomy. Atherosclerotic calcification of the aorta. Bladder wall appears thickened but is under distended. SKELETON: No abnormal hypermetabolism. Incidental CT findings: A lipoma is seen in left quadriceps musculature. IMPRESSION: 1. Small, mildly hypermetabolic lymph nodes in the neck, axillary regions, possibly right hilum, abdomen and pelvis, consistent with the given history of CLL. 2. Intramuscular fluid density lesion in the posterior left thigh shows mild hypermetabolism and is of uncertain etiology. 3. Aortic atherosclerosis (ICD10-I70.0). Coronary artery  calcification. 4. Enlarged pulmonic trunk, indicative of pulmonary arterial hypertension. 5.  Emphysema (ICD10-J43.9). Electronically Signed   By: Lorin Picket M.D.   On: 03/02/2021 14:16   Surgical Pathology 12/13/2017   ASSESSMENT & PLAN:   TERENCE GOOGE is a wonderful 75 y.o. female with    1. Rai 1 Chronic Lymphocytic Leukemia  - Trisomy 12 mutation. No associated thrombocytopenia.  Mild anemia - likely from acute blood loss from significant epistaxis. No constitutional symptoms. Minimal LNaednopathy No splenomegaly  #2 .Lung nodules on CT in 12/2017. Rpt CT chest 08/09/2018 - stable  #3 Patient Active Problem List   Diagnosis Date  Noted  . Counseling regarding advance care planning and goals of care 02/19/2021  . Iron deficiency anemia 02/12/2021  . Peripheral arterial disease ([place]) 10/03/2020  . (HFpEF) heart failure with preserved ejection fraction ([place]) 06/11/2020  . Venous insufficiency (chronic) (peripheral) 06/22/2019  . Pulmonary nodules 01/04/2018  . Mass of left lower leg 01/04/2018  . Chronic kidney disease (CKD), stage III (moderate) ([place]) 06/04/2016  . Chronic back pain 10/02/2015  . DM (diabetes mellitus), type 2 with peripheral vascular complications ([place]) 80/99/8338  . Chronic lymphocytic leukemia ([place]) 04/11/2009  . PERIPHERAL VASCULAR DISEASE 10/13/2007  . HYPERCHOLESTEROLEMIA 02/23/2007  . ANXIETY 02/23/2007  . TOBACCO DEPENDENCE 02/23/2007  . Essential hypertension 02/23/2007  . COPD (chronic obstructive pulmonary disease) ([place]) 02/23/2007  . GASTROESOPHAGEAL REFLUX, NO ESOPHAGITIS 02/23/2007    PLAN: -Discussed pt labwork today, 03/27/2021; counts and chemistries stable, Uric acid mildly elevated. -Advised pt that we cannot continue Gazyva even with the premedications due to bronchial spasms as her reaction to it despite all the premeds. -Discussed options of fixed duration Venetoclax versus ongoing Ibrutinib. Discussed release and increase of  uric acid due to tumor lysis in first two months with Venetoclax. -Will schedule chemo counseling. -Advised pt we will start on low dose and gradually increase while monitoring blood counts. -Discussed goal of knocking back CLL to control anemia and lymph nodes. -Advised pt to drink as much water as possible while starting this medication. -Continue to wear compression socks and elevate legs to alleviate leg swelling.  -Advised pt to be taking Allopurinol to decrease uric acid and control gout.  -Recommended pt drink 48-64 oz water daily. -Continue PO Iron and B-complex daily.  -Will send Allopurinol to pharmacy again.    FOLLOW UP: Weekly labs and IVF x 6 (starting in 2weeks) MD visit in 2 weeks after starting Venetoclax    The total time spent in the appointment was 20 minutes and more than 50% was on counseling and direct patient cares.    All of the patient's questions were answered with apparent satisfaction. The patient knows to call the clinic with any problems, questions or concerns.    Sullivan Lone MD [place] AAHIVMS [facility] Hematology/Oncology Physician [facility]  (Office):       [phone] (Work cell):  [phone] (Fax):           [phone]  I, Reinaldo Raddle, am acting as scribe for Dr. Sullivan Lone, MD.  .I have reviewed the above documentation for accuracy and completeness, and I agree with the above. Brunetta Genera MD

## 2021-03-27 ENCOUNTER — Inpatient Hospital Stay: Payer: Medicare Other | Attending: Hematology

## 2021-03-27 ENCOUNTER — Other Ambulatory Visit: Payer: Self-pay

## 2021-03-27 ENCOUNTER — Encounter: Payer: Self-pay | Admitting: Pharmacist

## 2021-03-27 ENCOUNTER — Inpatient Hospital Stay: Payer: Medicare Other | Admitting: Hematology

## 2021-03-27 ENCOUNTER — Telehealth: Payer: Self-pay

## 2021-03-27 DIAGNOSIS — Z7189 Other specified counseling: Secondary | ICD-10-CM | POA: Diagnosis not present

## 2021-03-27 DIAGNOSIS — C9110 Chronic lymphocytic leukemia of B-cell type not having achieved remission: Secondary | ICD-10-CM

## 2021-03-27 DIAGNOSIS — F17210 Nicotine dependence, cigarettes, uncomplicated: Secondary | ICD-10-CM | POA: Insufficient documentation

## 2021-03-27 DIAGNOSIS — R918 Other nonspecific abnormal finding of lung field: Secondary | ICD-10-CM | POA: Insufficient documentation

## 2021-03-27 DIAGNOSIS — R296 Repeated falls: Secondary | ICD-10-CM | POA: Insufficient documentation

## 2021-03-27 LAB — CMP (CANCER CENTER ONLY)
ALT: 14 U/L (ref 0–44)
AST: 19 U/L (ref 15–41)
Albumin: 3.8 g/dL (ref 3.5–5.0)
Alkaline Phosphatase: 289 U/L — ABNORMAL HIGH (ref 38–126)
Anion gap: 12 (ref 5–15)
BUN: 9 mg/dL (ref 8–23)
CO2: 28 mmol/L (ref 22–32)
Calcium: 8.8 mg/dL — ABNORMAL LOW (ref 8.9–10.3)
Chloride: 103 mmol/L (ref 98–111)
Creatinine: 1.36 mg/dL — ABNORMAL HIGH (ref 0.44–1.00)
GFR, Estimated: 41 mL/min — ABNORMAL LOW (ref >60)
Glucose, Bld: 129 mg/dL — ABNORMAL HIGH (ref 70–99)
Potassium: 3.8 mmol/L (ref 3.5–5.1)
Sodium: 143 mmol/L (ref 135–145)
Total Bilirubin: 0.4 mg/dL (ref 0.3–1.2)
Total Protein: 6.3 g/dL — ABNORMAL LOW (ref 6.5–8.1)

## 2021-03-27 LAB — CBC WITH DIFFERENTIAL/PLATELET
Abs Immature Granulocytes: 0.55 10*3/uL — ABNORMAL HIGH (ref 0.00–0.07)
Basophils Absolute: 0.1 10*3/uL (ref 0.0–0.1)
Basophils Relative: 0 %
Eosinophils Absolute: 0 10*3/uL (ref 0.0–0.5)
Eosinophils Relative: 0 %
HCT: 33.2 % — ABNORMAL LOW (ref 36.0–46.0)
Hemoglobin: 9.5 g/dL — ABNORMAL LOW (ref 12.0–15.0)
Immature Granulocytes: 0 %
Lymphocytes Relative: 85 %
Lymphs Abs: 150.2 10*3/uL — ABNORMAL HIGH (ref 0.7–4.0)
MCH: 24.7 pg — ABNORMAL LOW (ref 26.0–34.0)
MCHC: 28.6 g/dL — ABNORMAL LOW (ref 30.0–36.0)
MCV: 86.5 fL (ref 80.0–100.0)
Monocytes Absolute: 18.6 10*3/uL — ABNORMAL HIGH (ref 0.1–1.0)
Monocytes Relative: 11 %
Neutro Abs: 6.7 10*3/uL (ref 1.7–7.7)
Neutrophils Relative %: 4 %
Platelets: 172 10*3/uL (ref 150–400)
RBC: 3.84 MIL/uL — ABNORMAL LOW (ref 3.87–5.11)
RDW: 30.9 % — ABNORMAL HIGH (ref 11.5–15.5)
WBC: 176 10*3/uL (ref 4.0–10.5)
nRBC: 0.1 % (ref 0.0–0.2)

## 2021-03-27 LAB — URIC ACID: Uric Acid, Serum: 7.9 mg/dL — ABNORMAL HIGH (ref 2.5–7.1)

## 2021-03-27 MED ORDER — ALLOPURINOL 300 MG PO TABS: 150.0000 mg | ORAL_TABLET | Freq: Two times a day (BID) | ORAL | 1 refills | Status: DC

## 2021-03-27 NOTE — Telephone Encounter (Signed)
CRITICAL VALUE STICKER  CRITICAL VALUE: WBC 176  RECEIVER (on-site recipient of call): Wendall Mola, RN  DATE & TIME NOTIFIED: 03/27/21 @ 1219  MESSENGER (representative from lab): R. Owens Shark  MD NOTIFIED: Dr. Sullivan Lone  TIME OF NOTIFICATION: 1225  RESPONSE: Acknowledged. No further orders received.

## 2021-04-02 MED ORDER — VENETOCLAX 10 & 50 & 100 MG PO TBPK
ORAL_TABLET | ORAL | 0 refills | Status: DC
  Filled 2021-04-02: qty 42, fill #0
  Filled 2021-04-16: qty 42, 28d supply, fill #0

## 2021-04-03 ENCOUNTER — Telehealth: Payer: Self-pay | Admitting: Pharmacist

## 2021-04-03 ENCOUNTER — Telehealth: Payer: Self-pay

## 2021-04-03 ENCOUNTER — Other Ambulatory Visit (HOSPITAL_COMMUNITY): Payer: Self-pay

## 2021-04-03 NOTE — Telephone Encounter (Signed)
Oral Oncology Patient Advocate Encounter  After completing a benefits investigation, prior authorization for Venclexcta is not required at this time through [place].  Patient's copay is $9.85.     [place] Patient [place] Phone [phone] Fax [phone] 04/03/2021 8:30 AM

## 2021-04-03 NOTE — Telephone Encounter (Signed)
Oral Oncology Pharmacist Encounter  Received new prescription for Venclexta (venetoclax) for the treatment of CLL, planned duration until disease progression or unacceptable drug toxicity.  Prescription dose and frequency assessed for appropriateness. Patient will be dose escalated over a 5-week ramp up schedule to improve tolerance and decrease risk of TLS.   CBC w/ Diff, CMP and uric acid from 03/27/21 assessed, noted WBC 176 K/uL, ALC of 150.2 K/uL, Scr 1.36 (CrCl ~ 41 mL/min) and uric acid of 7.9 mg/dL. Last LDH reported is from 02/06/21 (elevated at 445 U/L). Patient does have Rx for allopurinol for TLS prevention. Patient will need to have labs (CBC w/ Diff, CMP, LDH, Uric acid and phos) scheduled as well as IVF.   Current medication list in Epic reviewed, no relevant/significant DDIs with Venclexta identified.  Evaluated chart and no patient barriers to medication adherence noted.   Prescription has been e-scribed to the [facility] for benefits analysis and approval.  Oral Oncology Clinic will continue to follow for insurance authorization, copayment issues, initial counseling and start date.  Leron Croak, PharmD, BCPS Hematology/Oncology Clinical Pharmacist [facility] [phone] 04/03/2021 8:39 AM

## 2021-04-13 ENCOUNTER — Telehealth: Payer: Self-pay | Admitting: Hematology

## 2021-04-13 NOTE — Telephone Encounter (Signed)
Scheduled appts per 4/7 sch msg. Pt aware. Pt is aware first infusion will be in [place].

## 2021-04-14 ENCOUNTER — Telehealth: Payer: Self-pay | Admitting: Hematology

## 2021-04-14 NOTE — Telephone Encounter (Signed)
Scheduled follow-up appointments per 4/15 staff message. Patient is aware.

## 2021-04-15 ENCOUNTER — Telehealth: Payer: Self-pay

## 2021-04-15 NOTE — Telephone Encounter (Signed)
Pt contacted regarding he request for help with a ride to the [place] center on 04/21/21. Informed pt that the transportation had been set up and she would be ;picked up at 0730-0745 on the 26 th. Pt verbalized understanding. Pt given contact information for transportation in case she needs further assistance with getting to her appointments.

## 2021-04-15 NOTE — Telephone Encounter (Signed)
Oral Chemotherapy Pharmacist Encounter  I spoke with patient for overview of: Venclexta (venetoclax) for the treatment of CLL until disease progression or unacceptable drug toxicity.  Counseled patient on administration, dosing, side effects, monitoring, drug-food interactions, safe handling, storage, and disposal.  Patient will take Venclexta 10mg  tablets, 2 tablets (20 mg) by mouth once daily with food and water for 7 days. The 1st dose of Venclexta 20mg  will be administered outpatient per MD. Baseline labs will be assessed and the 1st dose will be administered. CBC, uric acid, and electrolytes will be repeated on 04/23/21 per MD. Hydration will be administered.   Venclexta start date: 04/20/21 - patient knows not to start medication until after labs are drawn at [facility] on 04/20/21.  Patient states she continues to take allopurinol 150mg  every 12 hours. Patient was instructed to increase fluid intake to 1.5 - 2L of water per day starting 2 days prior to [facility] initiation.  Patient will take Venclexta 50mg  tablets, 1 tablet by mouth once daily with food and water for 7 days. The 1st dose of Venclexta 50mg  tablets will be administered outpatient per MD. Baseline labs will be assessed and the 1st dose will be administered. CBC, uric acid, and electrolytes will be repeated on 04/30/21 per MD.  Hydration will be administered.  Subsequent ramp-up doses of 100mg  daily x 7 days, 200mg  daily x 7 days, and 400mg  daily (target maintenance dose) will be monitored per protocol.  Adverse effects include but are not limited to: TLS, decreased blood counts, electrolyte abnormalities, diarrhea, nausea, fatigue, arthralgias/myalgias, and upper respiratory tract infection.    Patient has anti-emetic on hand and knows to take it if nausea develops.   Patient will obtain anti diarrheal and alert the office of 4 or more loose stools above baseline.  Reviewed with patient importance of keeping a medication  schedule and plan for any missed doses. No barriers to medication adherence identified.  Medication reconciliation performed and medication/allergy list updated.  Insurance authorization for Lynita Lombard has been obtained. Test claim at the pharmacy revealed copayment $0 for 1st fill of Venclexta. This will ship from the [facility] on 04/16/21 to deliver to patient's home on 04/17/21.  Patient informed the pharmacy will reach out 5-7 days prior to needing next fill of Venclexta to coordinate continued medication acquisition to prevent break in therapy.  All questions answered.  Ms. Bickle voiced understanding and appreciation.   Medication education handout and medication calendar will be given to patient in clinic on 04/20/21. Patient knows to call the office with questions or concerns. Oral Chemotherapy Clinic phone number provided to patient.   Leron Croak, PharmD, BCPS Hematology/Oncology Clinical Pharmacist [facility] [phone] 04/15/2021 3:31 PM

## 2021-04-16 ENCOUNTER — Telehealth: Payer: Self-pay | Admitting: Pharmacy Technician

## 2021-04-16 ENCOUNTER — Other Ambulatory Visit (HOSPITAL_COMMUNITY): Payer: Self-pay

## 2021-04-16 NOTE — Telephone Encounter (Signed)
Oral Oncology Patient Advocate Encounter   Was successful in securing patient an $44 grant from [facility]) to provide copayment coverage for Venclexta.  This will keep the out of pocket expense at $0.     I have spoken with the patient.    The billing information is as follows and has been shared with [facility].   Member ID: [id number] Group ID: [id number] RxBin: [id number] Dates of Eligibility: 01/16/21 through 04/15/22  Fund:  [employer] [employer] [facility] [place] Phone [phone] Fax [phone] 04/16/2021 2:03 PM

## 2021-04-17 ENCOUNTER — Other Ambulatory Visit: Payer: Medicare Other

## 2021-04-17 ENCOUNTER — Ambulatory Visit: Payer: Medicare Other

## 2021-04-20 ENCOUNTER — Other Ambulatory Visit: Payer: Self-pay

## 2021-04-20 ENCOUNTER — Inpatient Hospital Stay: Payer: Medicare Other

## 2021-04-20 DIAGNOSIS — C9110 Chronic lymphocytic leukemia of B-cell type not having achieved remission: Secondary | ICD-10-CM

## 2021-04-20 LAB — CBC WITH DIFFERENTIAL/PLATELET
Abs Immature Granulocytes: 0 10*3/uL (ref 0.00–0.07)
Basophils Absolute: 0 10*3/uL (ref 0.0–0.1)
Basophils Relative: 0 %
Eosinophils Absolute: 0 10*3/uL (ref 0.0–0.5)
Eosinophils Relative: 0 %
HCT: 33.2 % — ABNORMAL LOW (ref 36.0–46.0)
Hemoglobin: 9.9 g/dL — ABNORMAL LOW (ref 12.0–15.0)
Lymphocytes Relative: 97 %
Lymphs Abs: 179 10*3/uL — ABNORMAL HIGH (ref 0.7–4.0)
MCH: 26.6 pg (ref 26.0–34.0)
MCHC: 29.8 g/dL — ABNORMAL LOW (ref 30.0–36.0)
MCV: 89.2 fL (ref 80.0–100.0)
Monocytes Absolute: 1.8 10*3/uL — ABNORMAL HIGH (ref 0.1–1.0)
Monocytes Relative: 1 %
Neutro Abs: 3.7 10*3/uL (ref 1.7–7.7)
Neutrophils Relative %: 2 %
Platelets: 214 10*3/uL (ref 150–400)
RBC: 3.72 MIL/uL — ABNORMAL LOW (ref 3.87–5.11)
RDW: 27.4 % — ABNORMAL HIGH (ref 11.5–15.5)
WBC: 184.5 10*3/uL (ref 4.0–10.5)
nRBC: 0.1 % (ref 0.0–0.2)

## 2021-04-20 LAB — CMP (CANCER CENTER ONLY)
ALT: 11 U/L (ref 0–44)
AST: 21 U/L (ref 15–41)
Albumin: 3.7 g/dL (ref 3.5–5.0)
Alkaline Phosphatase: 286 U/L — ABNORMAL HIGH (ref 38–126)
Anion gap: 10 (ref 5–15)
BUN: 10 mg/dL (ref 8–23)
CO2: 28 mmol/L (ref 22–32)
Calcium: 9.1 mg/dL (ref 8.9–10.3)
Chloride: 102 mmol/L (ref 98–111)
Creatinine: 1.62 mg/dL — ABNORMAL HIGH (ref 0.44–1.00)
GFR, Estimated: 33 mL/min — ABNORMAL LOW (ref >60)
Glucose, Bld: 130 mg/dL — ABNORMAL HIGH (ref 70–99)
Potassium: 3.7 mmol/L (ref 3.5–5.1)
Sodium: 140 mmol/L (ref 135–145)
Total Bilirubin: 0.4 mg/dL (ref 0.3–1.2)
Total Protein: 6.4 g/dL — ABNORMAL LOW (ref 6.5–8.1)

## 2021-04-20 LAB — URIC ACID: Uric Acid, Serum: 3.3 mg/dL (ref 2.5–7.1)

## 2021-04-20 MED ORDER — SODIUM CHLORIDE 0.9 % IV SOLN
INTRAVENOUS | Status: AC
  Filled 2021-04-20: qty 250

## 2021-04-20 MED ORDER — SODIUM CHLORIDE 0.9 % IV SOLN
INTRAVENOUS | Status: AC
  Filled 2021-04-20 (×2): qty 250

## 2021-04-20 NOTE — Progress Notes (Signed)
CRITICAL VALUE STICKER  CRITICAL VALUE: WBC/ 184.5  RECEIVER Aarnav Steagall RN  DATE & TIME NOTIFIED: 04/20/2021 1215  MESSENGER: Lab  MD NOTIFIED: Dr. Irene Limbo   TIME OF NOTIFICATION: 1220  RESPONSE: No orders given

## 2021-04-20 NOTE — Patient Instructions (Signed)
Rehydration, Adult Rehydration is the replacement of body fluids, salts, and minerals (electrolytes) that are lost during dehydration. Dehydration is when there is not enough water or other fluids in the body. This happens when you lose more fluids than you take in. Common causes of dehydration include:  Not drinking enough fluids. This can occur when you are ill or doing activities that require a lot of energy, especially in hot weather.  Conditions that cause loss of water or other fluids, such as diarrhea, vomiting, sweating, or urinating a lot.  Other illnesses, such as fever or infection.  Certain medicines, such as those that remove excess fluid from the body (diuretics). Symptoms of mild or moderate dehydration may include thirst, dry lips and mouth, and dizziness. Symptoms of severe dehydration may include increased heart rate, confusion, fainting, and not urinating. For severe dehydration, you may need to get fluids through an IV at the hospital. For mild or moderate dehydration, you can usually rehydrate at home by drinking certain fluids as told by your health care provider. What are the risks? Generally, rehydration is safe. However, taking in too much fluid (overhydration) can be a problem. This is rare. Overhydration can cause an electrolyte imbalance, kidney failure, or a decrease in salt (sodium) levels in the body. Supplies needed You will need an oral rehydration solution (ORS) if your health care provider tells you to use one. This is a drink to treat dehydration. It can be found in pharmacies and retail stores. How to rehydrate Fluids Follow instructions from your health care provider for rehydration. The kind of fluid and the amount you should drink depend on your condition. In general, you should choose drinks that you prefer.  If told by your health care provider, drink an ORS. ? Make an ORS by following instructions on the package. ? Start by drinking small amounts,  about  cup (120 mL) every 5-10 minutes. ? Slowly increase how much you drink until you have taken the amount recommended by your health care provider.  Drink enough clear fluids to keep your urine pale yellow. If you were told to drink an ORS, finish it first, then start slowly drinking other clear fluids. Drink fluids such as: ? Water. This includes sparkling water and flavored water. Drinking only water can lead to having too little sodium in your body (hyponatremia). Follow the advice of your health care provider. ? Water from ice chips you suck on. ? Fruit juice with water you add to it (diluted). ? Sports drinks. ? Hot or cold herbal teas. ? Broth-based soups. ? Milk or milk products. Food Follow instructions from your health care provider about what to eat while you rehydrate. Your health care provider may recommend that you slowly begin eating regular foods in small amounts.  Eat foods that contain a healthy balance of electrolytes, such as bananas, oranges, potatoes, tomatoes, and spinach.  Avoid foods that are greasy or contain a lot of sugar. In some cases, you may get nutrition through a feeding tube that is passed through your nose and into your stomach (nasogastric tube, or NG tube). This may be done if you have uncontrolled vomiting or diarrhea.   Beverages to avoid Certain beverages may make dehydration worse. While you rehydrate, avoid drinking alcohol.   How to tell if you are recovering from dehydration You may be recovering from dehydration if:  You are urinating more often than before you started rehydrating.  Your urine is pale yellow.  Your energy level   improves.  You vomit less frequently.  You have diarrhea less frequently.  Your appetite improves or returns to normal.  You feel less dizzy or less light-headed.  Your skin tone and color start to look more normal. Follow these instructions at home:  Take over-the-counter and prescription medicines only  as told by your health care provider.  Do not take sodium tablets. Doing this can lead to having too much sodium in your body (hypernatremia). Contact a health care provider if:  You continue to have symptoms of mild or moderate dehydration, such as: ? Thirst. ? Dry lips. ? Slightly dry mouth. ? Dizziness. ? Dark urine or less urine than normal. ? Muscle cramps.  You continue to vomit or have diarrhea. Get help right away if you:  Have symptoms of dehydration that get worse.  Have a fever.  Have a severe headache.  Have been vomiting and the following happens: ? Your vomiting gets worse or does not go away. ? Your vomit includes blood or green matter (bile). ? You cannot eat or drink without vomiting.  Have problems with urination or bowel movements, such as: ? Diarrhea that gets worse or does not go away. ? Blood in your stool (feces). This may cause stool to look black and tarry. ? Not urinating, or urinating only a small amount of very dark urine, within 6-8 hours.  Have trouble breathing.  Have symptoms that get worse with treatment. These symptoms may represent a serious problem that is an emergency. Do not wait to see if the symptoms will go away. Get medical help right away. Call your local emergency services (911 in the [place] Do not drive yourself to the hospital. Summary  Rehydration is the replacement of body fluids and minerals (electrolytes) that are lost during dehydration.  Follow instructions from your health care provider for rehydration. The kind of fluid and amount you should drink depend on your condition.  Slowly increase how much you drink until you have taken the amount recommended by your health care provider.  Contact your health care provider if you continue to show signs of mild or moderate dehydration. This information is not intended to replace advice given to you by your health care provider. Make sure you discuss any questions you have with  your health care provider. Document Revised: 02/13/2020 Document Reviewed: 12/24/2019 Elsevier Patient Education  2021 Elsevier Inc.  

## 2021-04-21 ENCOUNTER — Telehealth: Payer: Self-pay | Admitting: *Deleted

## 2021-04-21 ENCOUNTER — Inpatient Hospital Stay: Payer: Medicare Other

## 2021-04-21 ENCOUNTER — Other Ambulatory Visit: Payer: Self-pay | Admitting: Hematology

## 2021-04-21 VITALS — BP 141/58 | HR 75 | Temp 98.7°F | Resp 17

## 2021-04-21 DIAGNOSIS — C9110 Chronic lymphocytic leukemia of B-cell type not having achieved remission: Secondary | ICD-10-CM | POA: Diagnosis not present

## 2021-04-21 DIAGNOSIS — E860 Dehydration: Secondary | ICD-10-CM

## 2021-04-21 DIAGNOSIS — Z7189 Other specified counseling: Secondary | ICD-10-CM

## 2021-04-21 LAB — COMPREHENSIVE METABOLIC PANEL
ALT: 10 U/L (ref 0–44)
AST: 49 U/L — ABNORMAL HIGH (ref 15–41)
Albumin: 4 g/dL (ref 3.5–5.0)
Alkaline Phosphatase: 253 U/L — ABNORMAL HIGH (ref 38–126)
Anion gap: 8 (ref 5–15)
BUN: 15 mg/dL (ref 8–23)
CO2: 29 mmol/L (ref 22–32)
Calcium: 9.2 mg/dL (ref 8.9–10.3)
Chloride: 105 mmol/L (ref 98–111)
Creatinine, Ser: 1.48 mg/dL — ABNORMAL HIGH (ref 0.44–1.00)
GFR, Estimated: 37 mL/min — ABNORMAL LOW (ref >60)
Glucose, Bld: 135 mg/dL — ABNORMAL HIGH (ref 70–99)
Potassium: 4.6 mmol/L (ref 3.5–5.1)
Sodium: 142 mmol/L (ref 135–145)
Total Bilirubin: 0.3 mg/dL (ref 0.3–1.2)
Total Protein: 6.2 g/dL — ABNORMAL LOW (ref 6.5–8.1)

## 2021-04-21 LAB — CBC WITH DIFFERENTIAL/PLATELET
Abs Immature Granulocytes: 0.29 10*3/uL — ABNORMAL HIGH (ref 0.00–0.07)
Basophils Absolute: 0.1 10*3/uL (ref 0.0–0.1)
Basophils Relative: 0 %
Eosinophils Absolute: 0 10*3/uL (ref 0.0–0.5)
Eosinophils Relative: 0 %
HCT: 33 % — ABNORMAL LOW (ref 36.0–46.0)
Hemoglobin: 10.1 g/dL — ABNORMAL LOW (ref 12.0–15.0)
Immature Granulocytes: 0 %
Lymphocytes Relative: 76 %
Lymphs Abs: 50.2 10*3/uL — ABNORMAL HIGH (ref 0.7–4.0)
MCH: 26.9 pg (ref 26.0–34.0)
MCHC: 30.6 g/dL (ref 30.0–36.0)
MCV: 87.8 fL (ref 80.0–100.0)
Monocytes Absolute: 8.4 10*3/uL — ABNORMAL HIGH (ref 0.1–1.0)
Monocytes Relative: 12 %
Neutro Abs: 8.3 10*3/uL — ABNORMAL HIGH (ref 1.7–7.7)
Neutrophils Relative %: 12 %
Platelets: 204 10*3/uL (ref 150–400)
RBC: 3.76 MIL/uL — ABNORMAL LOW (ref 3.87–5.11)
RDW: 26.6 % — ABNORMAL HIGH (ref 11.5–15.5)
WBC: 67.2 10*3/uL (ref 4.0–10.5)
nRBC: 0.3 % — ABNORMAL HIGH (ref 0.0–0.2)

## 2021-04-21 MED ORDER — SODIUM CHLORIDE 0.9 % IV SOLN
Freq: Once | INTRAVENOUS | Status: AC
Start: 2021-04-21 — End: 2021-04-21
  Filled 2021-04-21: qty 250

## 2021-04-21 NOTE — Patient Instructions (Signed)
Dehydration, Adult Dehydration is condition in which there is not enough water or other fluids in the body. This happens when a person loses more fluids than he or she takes in. Important body parts cannot work right without the right amount of fluids. Any loss of fluids from the body can cause dehydration. Dehydration can be mild, worse, or very bad. It should be treated right away to keep it from getting very bad. What are the causes? This condition may be caused by:  Conditions that cause loss of water or other fluids, such as: ? Watery poop (diarrhea). ? Vomiting. ? Sweating a lot. ? Peeing (urinating) a lot.  Not drinking enough fluids, especially when you: ? Are ill. ? Are doing things that take a lot of energy to do.  Other illnesses and conditions, such as fever or infection.  Certain medicines, such as medicines that take extra fluid out of the body (diuretics).  Lack of safe drinking water.  Not being able to get enough water and food. What increases the risk? The following factors may make you more likely to develop this condition:  Having a long-term (chronic) illness that has not been treated the right way, such as: ? Diabetes. ? Heart disease. ? Kidney disease.  Being 65 years of age or older.  Having a disability.  Living in a place that is high above the ground or sea (high in altitude). The thinner, dried air causes more fluid loss.  Doing exercises that put stress on your body for a long time. What are the signs or symptoms? Symptoms of dehydration depend on how bad it is. Mild or worse dehydration  Thirst.  Dry lips or dry mouth.  Feeling dizzy or light-headed, especially when you stand up from sitting.  Muscle cramps.  Your body making: ? Dark pee (urine). Pee may be the color of tea. ? Less pee than normal. ? Less tears than normal.  Headache. Very bad dehydration  Changes in skin. Skin may: ? Be cold to the touch (clammy). ? Be blotchy  or pale. ? Not go back to normal right after you lightly pinch it and let it go.  Little or no tears, pee, or sweat.  Changes in vital signs, such as: ? Fast breathing. ? Low blood pressure. ? Weak pulse. ? Pulse that is more than 100 beats a minute when you are sitting still.  Other changes, such as: ? Feeling very thirsty. ? Eyes that look hollow (sunken). ? Cold hands and feet. ? Being mixed up (confused). ? Being very tired (lethargic) or having trouble waking from sleep. ? Short-term weight loss. ? Loss of consciousness. How is this treated? Treatment for this condition depends on how bad it is. Treatment should start right away. Do not wait until your condition gets very bad. Very bad dehydration is an emergency. You will need to go to a hospital.  Mild or worse dehydration can be treated at home. You may be asked to: ? Drink more fluids. ? Drink an oral rehydration solution (ORS). This drink helps get the right amounts of fluids and salts and minerals in the blood (electrolytes).  Very bad dehydration can be treated: ? With fluids through an IV tube. ? By getting normal levels of salts and minerals in your blood. This is often done by giving salts and minerals through a tube. The tube is passed through your nose and into your stomach. ? By treating the root cause. Follow these instructions at   home: Oral rehydration solution If told by your doctor, drink an ORS:  Make an ORS. Use instructions on the package.  Start by drinking small amounts, about  cup (120 mL) every 5-10 minutes.  Slowly drink more until you have had the amount that your doctor said to have. Eating and drinking  Drink enough clear fluid to keep your pee pale yellow. If you were told to drink an ORS, finish the ORS first. Then, start slowly drinking other clear fluids. Drink fluids such as: ? Water. Do not drink only water. Doing that can make the salt (sodium) level in your body get too low. ? Water  from ice chips you suck on. ? Fruit juice that you have added water to (diluted). ? Low-calorie sports drinks.  Eat foods that have the right amounts of salts and minerals, such as: ? Bananas. ? Oranges. ? Potatoes. ? Tomatoes. ? Spinach.  Do not drink alcohol.  Avoid: ? Drinks that have a lot of sugar. These include:  High-calorie sports drinks.  Fruit juice that you did not add water to.  Soda.  Caffeine. ? Foods that are greasy or have a lot of fat or sugar.         General instructions  Take over-the-counter and prescription medicines only as told by your doctor.  Do not take salt tablets. Doing that can make the salt level in your body get too high.  Return to your normal activities as told by your doctor. Ask your doctor what activities are safe for you.  Keep all follow-up visits as told by your doctor. This is important. Contact a doctor if:  You have pain in your belly (abdomen) and the pain: ? Gets worse. ? Stays in one place.  You have a rash.  You have a stiff neck.  You get angry or annoyed (irritable) more easily than normal.  You are more tired or have a harder time waking than normal.  You feel: ? Weak or dizzy. ? Very thirsty. Get help right away if you have:  Any symptoms of very bad dehydration.  Symptoms of vomiting, such as: ? You cannot eat or drink without vomiting. ? Your vomiting gets worse or does not go away. ? Your vomit has blood or green stuff in it.  Symptoms that get worse with treatment.  A fever.  A very bad headache.  Problems with peeing or pooping (having a bowel movement), such as: ? Watery poop that gets worse or does not go away. ? Blood in your poop (stool). This may cause poop to look black and tarry. ? Not peeing in 6-8 hours. ? Peeing only a small amount of very dark pee in 6-8 hours.  Trouble breathing. These symptoms may be an emergency. Do not wait to see if the symptoms will go away. Get  medical help right away. Call your local emergency services (911 in the [place] Do not drive yourself to the hospital. Summary  Dehydration is a condition in which there is not enough water or other fluids in the body. This happens when a person loses more fluids than he or she takes in.  Treatment for this condition depends on how bad it is. Treatment should be started right away. Do not wait until your condition gets very bad.  Drink enough clear fluid to keep your pee pale yellow. If you were told to drink an oral rehydration solution (ORS), finish the ORS first. Then, start slowly drinking other clear fluids.    Take over-the-counter and prescription medicines only as told by your doctor.  Get help right away if you have any symptoms of very bad dehydration. This information is not intended to replace advice given to you by your health care provider. Make sure you discuss any questions you have with your health care provider. Document Revised: 07/26/2019 Document Reviewed: 07/26/2019 Elsevier Patient Education  2021 [employer] Inc.  

## 2021-04-21 NOTE — Telephone Encounter (Signed)
Dr. Irene Limbo notified of WBC-67.2.  No new orders received at this time.

## 2021-04-22 NOTE — Progress Notes (Signed)
HEMATOLOGY/ONCOLOGY CLINIC NOTE  Date of Service: 04/23/21    Patient Care Team: Danna Hefty, DO as PCP - General (Family Medicine) Sueanne Margarita, MD as PCP - Cardiology (Cardiology)  CHIEF COMPLAINTS/PURPOSE OF CONSULTATION:  F/u for CLL   HISTORY OF PRESENTING ILLNESS:  [name] [name] is a wonderful 75 y.o. female smoker with history of CLL who has been referred to [place] by Dr Tarry Kos, Archie Endo, DO at [facility] for evaluation and management of elevated WBC/lymphocytes.   She is accompanied by her husband. She reported to ED on 11/27/2017 with cough and abdominal pain with COPD exacerbation. Her labs on this day showed WBC elevated at 62.5. She has not previously seen a hematologist. She reports a plan to quit smoking on the first day of the new year. She reports worsening decrease in balance and states she has a cane and walker that she uses as needed. She was taking Prednisone.  No weight loss/fevers/chills/night sweats/ palpable lumps or bumps.  On review of systems, pt reports back pain, change in balance, abdominal pain, weight loss and denies changes in BM, fever and any other accompanying symptoms.   INTERVAL HISTORY:  [name] is here for management and evaluation of her Chronic Leukocytic Leukemia. The patient's last visit with [place] was on 03/27/2021. The pt reports that she is doing well overall. We are joined today by her husband.  The pt reports no issues tolerating the Venetoclax pills. The pt notes she started on Monday. The pt notes that she has been eating and drinking well. The pt notes she was unsure of why she was sent to [facility] and does not desire for any treatments there. The pt notes she has recently had some falls when she gets up from the bed and all inside her house. The pt notes some pain in her left leg due to these falls. The pt notes she has an open wound and intermittently covers it with a Band-Aid. The pt notes it looks as  though it is healing slowly.  Lab results today 04/23/2021 of CBC w/diff and CMP is as follows: all values are WNL except for WBC of 21.4K, RBC of 3.75, Hgb of 10.3, HCT of 33.2, RDW of 26.5, Lymphs Abs of 17.1K, Glucose of 136, Creatinine of 2.01, Total Protein of 6.4, Alkaline Phosphatase of 316, GFR est of 26. 04/23/2021 Uric Acid of 4.0.  On review of systems, pt reports left leg pain and denies decreased appetite, n/v/d, acute rashes, headaches, leg swelling, new lumps/bumps, and any other symptoms.  MEDICAL HISTORY:  Past Medical History:  Diagnosis Date  . Allergy    environmental  . Asthma   . CKD (chronic kidney disease)   . CLL (chronic lymphoblastic leukemia)   . Constipation 11/15/2011  . COPD (chronic obstructive pulmonary disease) ([place])   . Diabetes mellitus without complication ([place])   . GERD (gastroesophageal reflux disease)   . Headache    migraines  . Heart murmur   . Hyperlipidemia   . Hypertension   . PVD (peripheral vascular disease) ([place])   . Stroke [facility]) 1998   in 1998 due to tumor-right side    SURGICAL HISTORY: Past Surgical History:  Procedure Laterality Date  . ABDOMINAL AORTOGRAM W/LOWER EXTREMITY Bilateral 08/12/2020   Procedure: ABDOMINAL AORTOGRAM W/BILATERAL LOWER EXTREMITY RUNOFF;  Surgeon: Serafina Mitchell, MD;  Location: [facility];  Service: Cardiovascular;  Laterality: Bilateral;  . ABI  02/2012  ABI <0.65 BL 02/2012  . BRAIN MENINGIOMA EXCISION    . Bypass grafting of RLE for PAD claudication     . CARDIAC CATHETERIZATION  04/09/208  . CESAREAN SECTION     1974, 78 ,80  . CHOLECYSTECTOMY, LAPAROSCOPIC    . COLONOSCOPY  2014   [place], [place]  . ENDARTERECTOMY FEMORAL Right 10/03/2020   Procedure: RIGHT FEMORAL ENDARTERECTOMY WITH VEIN PATCH ANGIOPLASTY;  Surgeon: Serafina Mitchell, MD;  Location: [facility];  Service: Vascular;  Laterality: Right;  . ESOPHAGEAL DILATION    . FEMORAL-POPLITEAL BYPASS GRAFT Right 10/03/2020   Procedure:  RIGHT FEMORAL- BELOW KNEE POPLITEAL BYPASS USING GORE PROPATEN 6MM GRAFT;  Surgeon: Serafina Mitchell, MD;  Location: [place];  Service: Vascular;  Laterality: Right;  . PATCH ANGIOPLASTY  10/03/2020   Procedure: VEIN PATCH ANGIOPLASTY;  Surgeon: Serafina Mitchell, MD;  Location: [facility];  Service: Vascular;;    SOCIAL HISTORY: Social History   Socioeconomic History  . Marital status: Married    Spouse name: Not on file  . Number of children: 3  . Years of education: Not on file  . Highest education level: Not on file  Occupational History  . Occupation: DISABLED    Employer: DISABLED  Tobacco Use  . Smoking status: Current Every Day Smoker    Packs/day: 0.25    Years: 30.00    Pack years: 7.50    Types: Cigarettes    Start date: 01/01/1976  . Smokeless tobacco: Never Used  . Tobacco comment: ~ 3 cigarettes / day  Vaping Use  . Vaping Use: Never used  Substance and Sexual Activity  . Alcohol use: No    Alcohol/week: 0.0 standard drinks  . Drug use: No  . Sexual activity: Yes  Other Topics Concern  . Not on file  Social History Narrative   Health Care POA:    Emergency Contact: Julionna Marczak [phone] (c)   End of Life Plan:    Who lives with you: Lives with husband   Any pets: none   Diet: Patient lacks financial resources for much food. Pt reports eating what is available.   Exercise: Patient does not have an exercise plan.   Seatbelts: Patient reports wearing seatbelt when in vehicle.    [name] Exposure/Protection:   Hobbies: Bowling, computer games, Bingo      Has financial difficulties and transportation issues as she and her husband share transportation       Social Determinants of Radio broadcast assistant Strain: Not on file  Food Insecurity: Not on file  Transportation Needs: Not on file  Physical Activity: Not on file  Stress: Not on file  Social Connections: Not on file  Intimate Partner Violence: Not on file    FAMILY HISTORY: Family History  Problem  Relation Age of Onset  . Heart disease Mother   . Asthma Mother   . Cancer Mother        uterine   . Depression Mother   . Heart attack Mother 34  . Hyperlipidemia Mother   . Hypertension Mother   . Stroke Mother   . Kidney disease Mother   . Heart attack Sister   . Stroke Sister   . Depression Sister   . Diabetes Sister   . Hyperlipidemia Sister   . Depression Sister   . Diabetes Sister   . Hyperlipidemia Sister   . HIV/AIDS Sister   . Diabetes Brother   . Asthma Brother   . Hypertension  Brother   . Cancer Maternal Aunt        lung  . Heart disease Maternal Grandmother   . Heart attack Maternal Grandmother   . Cancer Brother        colon  . HIV/AIDS Brother   . Colon cancer Neg Hx     ALLERGIES:  is allergic to gazyva [obinutuzumab], no healthtouch food allergies, aspirin, and baclofen.  MEDICATIONS:  Current Outpatient Medications  Medication Sig Dispense Refill  . famotidine (PEPCID) 20 MG tablet Take 2 tablets by mouth every day 60 tablet 11  . montelukast (SINGULAIR) 10 MG tablet Take 1 tablet (10 mg total) by mouth at bedtime. 30 tablet 11  . acetaminophen (TYLENOL) 500 MG tablet Take 500-1,000 mg by mouth every 6 (six) hours as needed for moderate pain.     Marland Kitchen albuterol (ACCUNEB) 0.63 MG/3ML nebulizer solution Inhale 1 vial via nebulizer every 6 hours for wheezing as needed (Patient taking differently: Take 1 ampule by nebulization every 6 (six) hours as needed for wheezing or shortness of breath.) 75 mL 11  . albuterol (VENTOLIN HFA) 108 (90 Base) MCG/ACT inhaler Inhale 2 puffs by mouth every 6 hours as needed for wheezing or shortness of breath (Patient taking differently: Inhale 2 puffs into the lungs every 6 (six) hours as needed for wheezing or shortness of breath.) 8.5 g 11  . Alcohol Swabs (ALCOHOL PADS) 70 % PADS SMARTSIG:Pledget(s) Topical Daily    . amLODipine (NORVASC) 5 MG tablet Take 5 mg by mouth daily.    Marland Kitchen atorvastatin (LIPITOR) 80 MG tablet Take 1  tablet (80 mg total) by mouth daily. 90 tablet 3  . b complex vitamins capsule Take 1 capsule by mouth daily. 30 capsule 5  . Blood Glucose Calibration (OT ULTRA/FASTTK CNTRL SOLN) SOLN See admin instructions.    . Blood Glucose Monitoring Suppl (ONETOUCH VERIO) w/Device KIT 1 Device by Does not apply route daily. 1 kit 0  . BREO ELLIPTA 100-25 MCG/INH AEPB Inhale 1 puff by mouth every day 60 each 11  . clopidogrel (PLAVIX) 75 MG tablet Take 1 tablet by mouth every day 30 tablet 11  . cyclobenzaprine (FLEXERIL) 10 MG tablet Take 1 tablet by mouth 3 times a day as needed for muscle spasms. 90 tablet 11  . diclofenac Sodium (VOLTAREN) 1 % GEL Apply 2 grams topically four times a day 100 g 11  . Dulaglutide (TRULICITY) 6.64 QI/3.4VQ SOPN Inject 0.75 mg into the skin once a week. 2 mL 0  . DULoxetine (CYMBALTA) 60 MG capsule Take 1 capsule (60 mg total) by mouth daily. 90 capsule 3  . empagliflozin (JARDIANCE) 10 MG TABS tablet Take 1 tablet (10 mg total) by mouth daily. 90 tablet 3  . ergocalciferol (VITAMIN D2) 1.25 MG (50000 UT) capsule Take 50,000 Units by mouth every Monday.    . furosemide (LASIX) 20 MG tablet Take 1 tablet (20 mg total) by mouth daily. 30 tablet 3  . gabapentin (NEURONTIN) 300 MG capsule Take 1 capsule (300 mg total) by mouth 3 (three) times daily. X 1 week, then 600 mg BID- long term 600 mg 2x/day-  for nerve pain (Patient taking differently: Take 300 mg by mouth 3 (three) times daily.) 360 capsule 3  . glucose blood (ONETOUCH VERIO) test strip Use to check blood sugar once a day 100 each 12  . hydrALAZINE (APRESOLINE) 50 MG tablet Take 50 mg by mouth 3 (three) times daily.    . iron polysaccharides (NIFEREX)  150 MG capsule Take 1 capsule (150 mg total) by mouth daily. 30 capsule 5  . JARDIANCE 25 MG TABS tablet Take 25 mg by mouth daily.    Elmore Guise Devices (EASY MINI EJECT LANCING DEVICE) MISC daily.    . Lancets (ONETOUCH ULTRASOFT) lancets Use to check blood sugar once a  day 100 each 12  . lidocaine (XYLOCAINE) 5 % ointment Apply 1 application topically as needed. Apply to R>L feet when burning up to 4x/day as needed 50 g 3  . metFORMIN (GLUCOPHAGE) 500 MG tablet Take 500 mg by mouth daily.    . nitroGLYCERIN (NITROSTAT) 0.4 MG SL tablet Dissolve 1 tablet under tongue every 5 minutes, up to 3 doses for chest pain (Patient taking differently: Place 0.4 mg under the tongue every 5 (five) minutes as needed for chest pain. up to 3 doses) 25 tablet 11  . traMADol (ULTRAM) 50 MG tablet Take 1 tablet by mouth twice daily as needed for moderate to severe pain 30 tablet 3  . venetoclax (VENCLEXTA) 10 & 50 & 100 MG Starter Pack Take by mouth daily. Take 20 mg for 7 days, then 50 mg daily x 7d, then 100 mg daily x 7d, then 200 mg daily x 7d. Take with food & water. 42 each 0   Current Facility-Administered Medications  Medication Dose Route Frequency Provider Last Rate Last Admin  . 0.9 %  sodium chloride infusion   Intravenous Continuous Brunetta Genera, MD        REVIEW OF SYSTEMS:   10 Point review of Systems was done is negative except as noted above.  PHYSICAL EXAMINATION: ECOG FS:2 - Symptomatic, <50% confined to bed  Vitals:   04/23/21 1345  BP: (!) 116/58  Pulse: 82  Resp: 18  Temp: (!) 97 F (36.1 C)  SpO2: 98%   Wt Readings from Last 3 Encounters:  04/23/21 140 lb (63.5 kg)  04/20/21 142 lb 1.6 oz (64.5 kg)  03/27/21 142 lb 9.6 oz (64.7 kg)   Body mass index is 26.45 kg/m.    Exam was given in a chair.   GENERAL:alert, in no acute distress and comfortable SKIN: no acute rashes, no significant lesions EYES: conjunctiva are pink and non-injected, sclera anicteric OROPHARYNX: MMM, no exudates, no oropharyngeal erythema or ulceration NECK: supple, no JVD LYMPH:  no palpable lymphadenopathy in the cervical, axillary or inguinal regions LUNGS: clear to auscultation b/l with normal respiratory effort HEART: regular rate & rhythm ABDOMEN:   normoactive bowel sounds , non tender, not distended. Extremity: no pedal edema PSYCH: alert & oriented x 3 with fluent speech NEURO: no focal motor/sensory deficits  LABORATORY DATA:  I have reviewed the data as listed   CBC Latest Ref Rng & Units 04/23/2021 04/21/2021 04/20/2021  WBC 4.0 - 10.5 K/uL 21.4(H) 67.2(HH) 184.5(HH)  Hemoglobin 12.0 - 15.0 g/dL 10.3(L) 10.1(L) 9.9(L)  Hematocrit 36.0 - 46.0 % 33.2(L) 33.0(L) 33.2(L)  Platelets 150 - 400 K/uL 170 204 214    . CMP Latest Ref Rng & Units 04/23/2021 04/21/2021 04/20/2021  Glucose 70 - 99 mg/dL 136(H) 135(H) 130(H)  BUN 8 - 23 mg/dL _0 Creatinine 0.44 - 1.00 mg/dL 2.01(H) 1.48(H) 1.62(H)  Sodium 135 - 145 mmol/L 144 142 140  Potassium 3.5 - 5.1 mmol/L 3.8 4.6 3.7  Chloride 98 - 111 mmol/L 107 105 102  CO2 22 - 32 mmol/L _1 Calcium 8.9 - 10.3 mg/dL 9.2 9.2 9.1  Total  Protein 6.5 - 8.1 g/dL 6.4(L) 6.2(L) 6.4(L)  Total Bilirubin 0.3 - 1.2 mg/dL 0.4 0.3 0.4  Alkaline Phos 38 - 126 U/L 316(H) 253(H) 286(H)  AST 15 - 41 U/L 23 49(H) 21  ALT 0 - 44 U/L _0 .Marland Kitchen Lab Results  Component Value Date   IRON 37 (L) 02/06/2021   TIBC 424 02/06/2021   IRONPCTSAT 9 (L) 02/06/2021   (Iron and TIBC)  Lab Results  Component Value Date   FERRITIN 15 02/06/2021    Component     Latest Ref Rng & Units 02/06/2021  Vitamin B12     180 - 914 pg/mL 448  Haptoglobin     42 - 346 mg/dL 223   Lab Results  Component Value Date   LDH 445 (H) 02/06/2021        . Lab Results  Component Value Date   LDH 445 (H) 02/06/2021    RADIOGRAPHIC STUDIES: I have personally reviewed the radiological images as listed and agreed with the findings in the report. No results found. Surgical Pathology 12/13/2017   ASSESSMENT & PLAN:   MADDISEN VOUGHT is a wonderful 75 y.o. female with    1. Rai 1 Chronic Lymphocytic Leukemia  - Trisomy 12 mutation. No associated thrombocytopenia.  Mild anemia - likely from acute blood  loss from significant epistaxis. No constitutional symptoms. Minimal LNaednopathy No splenomegaly  #2 .Lung nodules on CT in 12/2017. Rpt CT chest 08/09/2018 - stable  #3 Patient Active Problem List   Diagnosis Date Noted  . Counseling regarding advance care planning and goals of care 02/19/2021  . Iron deficiency anemia 02/12/2021  . Peripheral arterial disease ([place]) 10/03/2020  . (HFpEF) heart failure with preserved ejection fraction ([place]) 06/11/2020  . Venous insufficiency (chronic) (peripheral) 06/22/2019  . Pulmonary nodules 01/04/2018  . Mass of left lower leg 01/04/2018  . Chronic kidney disease (CKD), stage III (moderate) ([place]) 06/04/2016  . Chronic back pain 10/02/2015  . DM (diabetes mellitus), type 2 with peripheral vascular complications ([place]) 23/30/0762  . Chronic lymphocytic leukemia ([place]) 04/11/2009  . PERIPHERAL VASCULAR DISEASE 10/13/2007  . HYPERCHOLESTEROLEMIA 02/23/2007  . ANXIETY 02/23/2007  . TOBACCO DEPENDENCE 02/23/2007  . Essential hypertension 02/23/2007  . COPD (chronic obstructive pulmonary disease) ([place]) 02/23/2007  . GASTROESOPHAGEAL REFLUX, NO ESOPHAGITIS 02/23/2007    PLAN: -Discussed pt labwork today, 04/23/2021; WBC greatly improved. Other counts stable. Creatinine slightly higher.  -Advised pt we can stop IV fluids next week as long as the pt continues to drink a lot of water. -Recommended pt use the walker inside the house as opposed to the cane due to recent falls. -Advised pt that she is currently set up for fluids every Tuesday and Thursday here. Pt is aware to contact if any appt elsewhere. -Continue to wear compression socks and elevate legs to alleviate leg swelling.  -Recommended pt drink 48-64 oz water daily. -Continue PO Iron and B-complex daily.  -Will see back in 2 weeks with labs.    FOLLOW UP: Labs and 1 liter of IV NS every Monday and Thursday for 2 weeks MD visit in 2 weeks    The total time spent in the appointment was  20 minutes and more than 50% was on counseling and direct patient cares.    All of the patient's questions were answered with apparent satisfaction. The patient knows to call the clinic with any problems, questions or concerns.    Sullivan Lone MD [place] AAHIVMS [facility]  [facility] Hematology/Oncology Physician [facility]  (Office):       [phone] (Work cell):  [phone] (Fax):           [phone]  I, Reinaldo Raddle, am acting as scribe for Dr. Sullivan Lone, MD.  .I have reviewed the above documentation for accuracy and completeness, and I agree with the above. Brunetta Genera MD

## 2021-04-23 ENCOUNTER — Inpatient Hospital Stay (HOSPITAL_BASED_OUTPATIENT_CLINIC_OR_DEPARTMENT_OTHER): Payer: Medicare Other | Admitting: Hematology

## 2021-04-23 ENCOUNTER — Inpatient Hospital Stay: Payer: Medicare Other

## 2021-04-23 ENCOUNTER — Other Ambulatory Visit: Payer: Self-pay

## 2021-04-23 VITALS — BP 116/58 | HR 82 | Temp 97.0°F | Resp 18 | Ht 61.0 in | Wt 140.0 lb

## 2021-04-23 DIAGNOSIS — C9110 Chronic lymphocytic leukemia of B-cell type not having achieved remission: Secondary | ICD-10-CM

## 2021-04-23 LAB — CMP (CANCER CENTER ONLY)
ALT: 10 U/L (ref 0–44)
AST: 23 U/L (ref 15–41)
Albumin: 3.7 g/dL (ref 3.5–5.0)
Alkaline Phosphatase: 316 U/L — ABNORMAL HIGH (ref 38–126)
Anion gap: 11 (ref 5–15)
BUN: 16 mg/dL (ref 8–23)
CO2: 26 mmol/L (ref 22–32)
Calcium: 9.2 mg/dL (ref 8.9–10.3)
Chloride: 107 mmol/L (ref 98–111)
Creatinine: 2.01 mg/dL — ABNORMAL HIGH (ref 0.44–1.00)
GFR, Estimated: 26 mL/min — ABNORMAL LOW (ref >60)
Glucose, Bld: 136 mg/dL — ABNORMAL HIGH (ref 70–99)
Potassium: 3.8 mmol/L (ref 3.5–5.1)
Sodium: 144 mmol/L (ref 135–145)
Total Bilirubin: 0.4 mg/dL (ref 0.3–1.2)
Total Protein: 6.4 g/dL — ABNORMAL LOW (ref 6.5–8.1)

## 2021-04-23 LAB — CBC WITH DIFFERENTIAL/PLATELET
Abs Immature Granulocytes: 0 10*3/uL (ref 0.00–0.07)
Basophils Absolute: 0 10*3/uL (ref 0.0–0.1)
Basophils Relative: 0 %
Eosinophils Absolute: 0 10*3/uL (ref 0.0–0.5)
Eosinophils Relative: 0 %
HCT: 33.2 % — ABNORMAL LOW (ref 36.0–46.0)
Hemoglobin: 10.3 g/dL — ABNORMAL LOW (ref 12.0–15.0)
Lymphocytes Relative: 80 %
Lymphs Abs: 17.1 10*3/uL — ABNORMAL HIGH (ref 0.7–4.0)
MCH: 27.5 pg (ref 26.0–34.0)
MCHC: 31 g/dL (ref 30.0–36.0)
MCV: 88.5 fL (ref 80.0–100.0)
Monocytes Absolute: 0.9 10*3/uL (ref 0.1–1.0)
Monocytes Relative: 4 %
Neutro Abs: 3.4 10*3/uL (ref 1.7–7.7)
Neutrophils Relative %: 16 %
Platelets: 170 10*3/uL (ref 150–400)
RBC: 3.75 MIL/uL — ABNORMAL LOW (ref 3.87–5.11)
RDW: 26.5 % — ABNORMAL HIGH (ref 11.5–15.5)
WBC: 21.4 10*3/uL — ABNORMAL HIGH (ref 4.0–10.5)
nRBC: 0.1 % (ref 0.0–0.2)

## 2021-04-23 LAB — URIC ACID: Uric Acid, Serum: 4 mg/dL (ref 2.5–7.1)

## 2021-04-23 MED ORDER — SODIUM CHLORIDE 0.9 % IV SOLN
INTRAVENOUS | Status: AC
  Filled 2021-04-23 (×2): qty 250

## 2021-04-23 NOTE — Patient Instructions (Signed)
Rehydration, Adult Rehydration is the replacement of body fluids, salts, and minerals (electrolytes) that are lost during dehydration. Dehydration is when there is not enough water or other fluids in the body. This happens when you lose more fluids than you take in. Common causes of dehydration include:  Not drinking enough fluids. This can occur when you are ill or doing activities that require a lot of energy, especially in hot weather.  Conditions that cause loss of water or other fluids, such as diarrhea, vomiting, sweating, or urinating a lot.  Other illnesses, such as fever or infection.  Certain medicines, such as those that remove excess fluid from the body (diuretics). Symptoms of mild or moderate dehydration may include thirst, dry lips and mouth, and dizziness. Symptoms of severe dehydration may include increased heart rate, confusion, fainting, and not urinating. For severe dehydration, you may need to get fluids through an IV at the hospital. For mild or moderate dehydration, you can usually rehydrate at home by drinking certain fluids as told by your health care provider. What are the risks? Generally, rehydration is safe. However, taking in too much fluid (overhydration) can be a problem. This is rare. Overhydration can cause an electrolyte imbalance, kidney failure, or a decrease in salt (sodium) levels in the body. Supplies needed You will need an oral rehydration solution (ORS) if your health care provider tells you to use one. This is a drink to treat dehydration. It can be found in pharmacies and retail stores. How to rehydrate Fluids Follow instructions from your health care provider for rehydration. The kind of fluid and the amount you should drink depend on your condition. In general, you should choose drinks that you prefer.  If told by your health care provider, drink an ORS. ? Make an ORS by following instructions on the package. ? Start by drinking small amounts,  about  cup (120 mL) every 5-10 minutes. ? Slowly increase how much you drink until you have taken the amount recommended by your health care provider.  Drink enough clear fluids to keep your urine pale yellow. If you were told to drink an ORS, finish it first, then start slowly drinking other clear fluids. Drink fluids such as: ? Water. This includes sparkling water and flavored water. Drinking only water can lead to having too little sodium in your body (hyponatremia). Follow the advice of your health care provider. ? Water from ice chips you suck on. ? Fruit juice with water you add to it (diluted). ? Sports drinks. ? Hot or cold herbal teas. ? Broth-based soups. ? Milk or milk products. Food Follow instructions from your health care provider about what to eat while you rehydrate. Your health care provider may recommend that you slowly begin eating regular foods in small amounts.  Eat foods that contain a healthy balance of electrolytes, such as bananas, oranges, potatoes, tomatoes, and spinach.  Avoid foods that are greasy or contain a lot of sugar. In some cases, you may get nutrition through a feeding tube that is passed through your nose and into your stomach (nasogastric tube, or NG tube). This may be done if you have uncontrolled vomiting or diarrhea.   Beverages to avoid Certain beverages may make dehydration worse. While you rehydrate, avoid drinking alcohol.   How to tell if you are recovering from dehydration You may be recovering from dehydration if:  You are urinating more often than before you started rehydrating.  Your urine is pale yellow.  Your energy level   improves.  You vomit less frequently.  You have diarrhea less frequently.  Your appetite improves or returns to normal.  You feel less dizzy or less light-headed.  Your skin tone and color start to look more normal. Follow these instructions at home:  Take over-the-counter and prescription medicines only  as told by your health care provider.  Do not take sodium tablets. Doing this can lead to having too much sodium in your body (hypernatremia). Contact a health care provider if:  You continue to have symptoms of mild or moderate dehydration, such as: ? Thirst. ? Dry lips. ? Slightly dry mouth. ? Dizziness. ? Dark urine or less urine than normal. ? Muscle cramps.  You continue to vomit or have diarrhea. Get help right away if you:  Have symptoms of dehydration that get worse.  Have a fever.  Have a severe headache.  Have been vomiting and the following happens: ? Your vomiting gets worse or does not go away. ? Your vomit includes blood or green matter (bile). ? You cannot eat or drink without vomiting.  Have problems with urination or bowel movements, such as: ? Diarrhea that gets worse or does not go away. ? Blood in your stool (feces). This may cause stool to look black and tarry. ? Not urinating, or urinating only a small amount of very dark urine, within 6-8 hours.  Have trouble breathing.  Have symptoms that get worse with treatment. These symptoms may represent a serious problem that is an emergency. Do not wait to see if the symptoms will go away. Get medical help right away. Call your local emergency services (911 in the [place] Do not drive yourself to the hospital. Summary  Rehydration is the replacement of body fluids and minerals (electrolytes) that are lost during dehydration.  Follow instructions from your health care provider for rehydration. The kind of fluid and amount you should drink depend on your condition.  Slowly increase how much you drink until you have taken the amount recommended by your health care provider.  Contact your health care provider if you continue to show signs of mild or moderate dehydration. This information is not intended to replace advice given to you by your health care provider. Make sure you discuss any questions you have with  your health care provider. Document Revised: 02/13/2020 Document Reviewed: 12/24/2019 Elsevier Patient Education  2021 Elsevier Inc.  

## 2021-04-23 NOTE — Progress Notes (Deleted)
Subjective:   Patient ID: [name] [name]    DOB: [birth year], 75 y.o. female   MRN: [id number]  [name] [name] is a 75 y.o. female with a history of HFrEF, T2DM, HTN, PAD, stroke, venous insufficiency, asthma/COPD, GERD, CKD III, allergy, anxiety, chronic back pain, CLL, headache, heart murmur, HLD, IDA, mass of left lower leg, pulmonary nodules, tobacco dependence here for diabetes follow up  Diabetes: Last three A1C's below. Currently on Trulicity 5.70VX qweekly, Jardiance 102m QD, Metformin 508mQD. Endorses compliance. Notes CBGs range ***. Denies any hypoglycemia. Denies any polyuria, polydipsia, polyphagia. Due for ***. Dexcom: document in plan "A1C ***. Currently monitoring and taking >3 injection per day". Initial RX send message to RN team. Refills - just send to pharmacy "Patient is a good candidate for a CGM device given history of hypoglycemia and using 3 insulin injections per day."  - Continuous Blood Gluc Receiver (DEXCOM G6 RECEIVER) DEVI  - Continuous Blood Gluc Sensor (DEXCOM G6 SENSOR) MISC  - Continuous Blood Gluc Transmit (DEXCOM G6 TRANSMITTER) MISC Lantus/Novolug/Humalog Kwikpens/GLP-1 pens - still need to order BD Insulin Pen Needle (B-D UF III MINI PEN NEEDLES) 31G X 5 MM MISC  Lab Results  Component Value Date   HGBA1C 8.0 (A) 01/06/2021   HGBA1C 7.4 (H) 09/24/2020   HGBA1C 6.7 07/10/2020    HTN:    today. Currently on Amlodipine 9m36mD, Hydralazine 40m47mD. Endorses compliance. Non-smoker/Current everyday smoker***. Denies any chest pain, SOB, vision changes, or headaches.   Lab Results  Component Value Date   CREATININE 2.01 (H) 04/23/2021   CREATININE 1.48 (H) 04/21/2021   CREATININE 1.62 (H) 04/20/2021    HLD: Last lipid panel below. Currently on Atorvastatin 80mg64m Endorses compliance. Denies any muscles aches or weakness. The ASCVD Risk score (GoffMikey Bussingr., et al., 2013) failed to calculate for the following reasons:   The valid total cholesterol  range is 130 to 320 mg/dL  (Start statin if LDL >190 or ASCVD score >10, talk with patient if score btw 5-10%-can consider calcium score, dont recommend if <5 and repeat in 12 mo) (IF start repeat level in 6-8 weeks for 30-50% drop with high statin)   Lab Results  Component Value Date   CHOL 103 10/04/2020   HDL 24 (L) 10/04/2020   LDLCALC 49 10/04/2020   LDLDIRECT 62 09/24/2010   TRIG 151 (H) 10/04/2020   CHOLHDL 4.3 10/04/2020    Current medications: Plavix 79mg 61mflexeril 10mg Q77mymbalta 60mg QD79msix 20mg QD,79mapentin 600mg BID,54mmadol 40mg BID P36mCKD IV:  eGFR 26 on last CMP on 04/23/21. CrCl 25 Recommendations: stop metformin, taper off of duloxetine,  Continue Jardiance 10mg QD as 64m as GFR >20, Trulici>79(no dose adjustments), Amlodipine (no dose adjustment), Hydralazine (as long as GFR >10), Atorvastatin (no dose adjustments), Plavix (no dose adjustments), Flexeril (no dose adjustment), Gabapentin max 600mg/day (di14md doses), Tramadol IR (avoid ER) q12 hour dosing with 200mg/day max 39mneed higher doses to achieve effective diuresis  Review of Systems:  Per HPI.   Objective:   There were no vitals taken for this visit. Vitals and nursing note reviewed.  General: pleasant ***, sitting comfortably in exam chair, well nourished, well developed, in no acute distress with non-toxic appearance HEENT: normocephalic, atraumatic, moist mucous membranes, oropharynx clear without erythema or exudate, TM normal bilaterally  Neck: supple, non-tender without lymphadenopathy CV: regular rate and rhythm without murmurs, rubs, or gallops, no lower extremity edema,  2+ radial and pedal pulses bilaterally Lungs: clear to auscultation bilaterally with normal work of breathing on room air Resp: breathing comfortably on room air, speaking in full sentences Abdomen: soft, non-tender, non-distended, no masses or organomegaly palpable, normoactive bowel sounds Skin: warm, dry, no  rashes or lesions Extremities: warm and well perfused, normal tone MSK: ROM grossly intact, strength intact, gait normal Neuro: Alert and oriented, speech normal  Assessment & Plan:   No problem-specific Assessment & Plan notes found for this encounter.  No orders of the defined types were placed in this encounter.  No orders of the defined types were placed in this encounter.   {    This will disappear when note is signed, click to select method of visit    :1}  Mina Marble, DO PGY-3, [facility] 04/23/2021 9:26 PM

## 2021-04-24 ENCOUNTER — Ambulatory Visit: Payer: Medicare Other

## 2021-04-24 ENCOUNTER — Other Ambulatory Visit: Payer: Medicare Other

## 2021-04-24 ENCOUNTER — Ambulatory Visit: Payer: Medicare Other | Admitting: Family Medicine

## 2021-04-25 ENCOUNTER — Other Ambulatory Visit: Payer: Self-pay | Admitting: Family Medicine

## 2021-04-27 ENCOUNTER — Emergency Department (HOSPITAL_COMMUNITY): Payer: Medicare Other

## 2021-04-27 ENCOUNTER — Inpatient Hospital Stay: Payer: Medicare Other

## 2021-04-27 ENCOUNTER — Emergency Department (HOSPITAL_COMMUNITY)
Admission: EM | Admit: 2021-04-27 | Discharge: 2021-04-27 | Disposition: A | Discharge status: Home / Self Care | Payer: Medicare Other | Attending: Emergency Medicine | Admitting: Emergency Medicine

## 2021-04-27 ENCOUNTER — Telehealth: Payer: Self-pay

## 2021-04-27 ENCOUNTER — Encounter (HOSPITAL_COMMUNITY): Payer: Self-pay

## 2021-04-27 ENCOUNTER — Encounter: Payer: Self-pay | Admitting: Surgery

## 2021-04-27 DIAGNOSIS — W19XXXA Unspecified fall, initial encounter: Secondary | ICD-10-CM

## 2021-04-27 DIAGNOSIS — Z7984 Long term (current) use of oral hypoglycemic drugs: Secondary | ICD-10-CM | POA: Diagnosis not present

## 2021-04-27 DIAGNOSIS — E1122 Type 2 diabetes mellitus with diabetic chronic kidney disease: Secondary | ICD-10-CM | POA: Diagnosis not present

## 2021-04-27 DIAGNOSIS — F17200 Nicotine dependence, unspecified, uncomplicated: Secondary | ICD-10-CM | POA: Diagnosis not present

## 2021-04-27 DIAGNOSIS — M25521 Pain in right elbow: Secondary | ICD-10-CM | POA: Insufficient documentation

## 2021-04-27 DIAGNOSIS — Z7902 Long term (current) use of antithrombotics/antiplatelets: Secondary | ICD-10-CM | POA: Diagnosis not present

## 2021-04-27 DIAGNOSIS — Y92009 Unspecified place in unspecified non-institutional (private) residence as the place of occurrence of the external cause: Secondary | ICD-10-CM | POA: Diagnosis not present

## 2021-04-27 DIAGNOSIS — J449 Chronic obstructive pulmonary disease, unspecified: Secondary | ICD-10-CM | POA: Diagnosis not present

## 2021-04-27 DIAGNOSIS — Z7951 Long term (current) use of inhaled steroids: Secondary | ICD-10-CM | POA: Insufficient documentation

## 2021-04-27 DIAGNOSIS — M79632 Pain in left forearm: Secondary | ICD-10-CM | POA: Diagnosis not present

## 2021-04-27 DIAGNOSIS — Z794 Long term (current) use of insulin: Secondary | ICD-10-CM | POA: Insufficient documentation

## 2021-04-27 DIAGNOSIS — N189 Chronic kidney disease, unspecified: Secondary | ICD-10-CM | POA: Diagnosis not present

## 2021-04-27 DIAGNOSIS — M25531 Pain in right wrist: Secondary | ICD-10-CM | POA: Insufficient documentation

## 2021-04-27 DIAGNOSIS — I129 Hypertensive chronic kidney disease with stage 1 through stage 4 chronic kidney disease, or unspecified chronic kidney disease: Secondary | ICD-10-CM | POA: Diagnosis not present

## 2021-04-27 DIAGNOSIS — S0990XA Unspecified injury of head, initial encounter: Secondary | ICD-10-CM | POA: Diagnosis present

## 2021-04-27 DIAGNOSIS — R7981 Abnormal blood-gas level: Secondary | ICD-10-CM

## 2021-04-27 DIAGNOSIS — S0083XA Contusion of other part of head, initial encounter: Secondary | ICD-10-CM | POA: Diagnosis not present

## 2021-04-27 DIAGNOSIS — W1830XA Fall on same level, unspecified, initial encounter: Secondary | ICD-10-CM | POA: Insufficient documentation

## 2021-04-27 DIAGNOSIS — Z79899 Other long term (current) drug therapy: Secondary | ICD-10-CM | POA: Insufficient documentation

## 2021-04-27 DIAGNOSIS — T07XXXA Unspecified multiple injuries, initial encounter: Secondary | ICD-10-CM

## 2021-04-27 HISTORY — DX: Neoplasm of unspecified behavior of brain: D49.6

## 2021-04-27 HISTORY — DX: Chronic lymphocytic leukemia of B-cell type not having achieved remission: C91.10

## 2021-04-27 HISTORY — DX: Leukemia, unspecified not having achieved remission: C95.90

## 2021-04-27 HISTORY — DX: Type 2 diabetes mellitus without complications: E11.9

## 2021-04-27 HISTORY — DX: Cerebral infarction, unspecified: I63.9

## 2021-04-27 LAB — I-STAT CHEM 8, ED
BUN: 20 mg/dL (ref 8–23)
Calcium, Ion: 1.13 mmol/L — ABNORMAL LOW (ref 1.15–1.40)
Chloride: 105 mmol/L (ref 98–111)
Creatinine, Ser: 1.7 mg/dL — ABNORMAL HIGH (ref 0.44–1.00)
Glucose, Bld: 106 mg/dL — ABNORMAL HIGH (ref 70–99)
HCT: 32 % — ABNORMAL LOW (ref 36.0–46.0)
Hemoglobin: 10.9 g/dL — ABNORMAL LOW (ref 12.0–15.0)
Potassium: 3.7 mmol/L (ref 3.5–5.1)
Sodium: 142 mmol/L (ref 135–145)
TCO2: 27 mmol/L (ref 22–32)

## 2021-04-27 LAB — CBC WITH DIFFERENTIAL/PLATELET
Abs Immature Granulocytes: 0.06 10*3/uL (ref 0.00–0.07)
Basophils Absolute: 0.1 10*3/uL (ref 0.0–0.1)
Basophils Relative: 0 %
Eosinophils Absolute: 0.1 10*3/uL (ref 0.0–0.5)
Eosinophils Relative: 0 %
HCT: 32.2 % — ABNORMAL LOW (ref 36.0–46.0)
Hemoglobin: 9.9 g/dL — ABNORMAL LOW (ref 12.0–15.0)
Immature Granulocytes: 0 %
Lymphocytes Relative: 49 %
Lymphs Abs: 13 10*3/uL — ABNORMAL HIGH (ref 0.7–4.0)
MCH: 27.6 pg (ref 26.0–34.0)
MCHC: 30.7 g/dL (ref 30.0–36.0)
MCV: 89.7 fL (ref 80.0–100.0)
Monocytes Absolute: 9.6 10*3/uL — ABNORMAL HIGH (ref 0.1–1.0)
Monocytes Relative: 35 %
Neutro Abs: 4.3 10*3/uL (ref 1.7–7.7)
Neutrophils Relative %: 16 %
Platelets: 198 10*3/uL (ref 150–400)
RBC: 3.59 MIL/uL — ABNORMAL LOW (ref 3.87–5.11)
RDW: 25.4 % — ABNORMAL HIGH (ref 11.5–15.5)
WBC: 27 10*3/uL — ABNORMAL HIGH (ref 4.0–10.5)
nRBC: 0 % (ref 0.0–0.2)

## 2021-04-27 LAB — COMPREHENSIVE METABOLIC PANEL
ALT: 12 U/L (ref 0–44)
AST: 17 U/L (ref 15–41)
Albumin: 3.5 g/dL (ref 3.5–5.0)
Alkaline Phosphatase: 235 U/L — ABNORMAL HIGH (ref 38–126)
Anion gap: 9 (ref 5–15)
BUN: 16 mg/dL (ref 8–23)
CO2: 26 mmol/L (ref 22–32)
Calcium: 8.6 mg/dL — ABNORMAL LOW (ref 8.9–10.3)
Chloride: 105 mmol/L (ref 98–111)
Creatinine, Ser: 1.7 mg/dL — ABNORMAL HIGH (ref 0.44–1.00)
GFR, Estimated: 31 mL/min — ABNORMAL LOW (ref >60)
Glucose, Bld: 110 mg/dL — ABNORMAL HIGH (ref 70–99)
Potassium: 3.7 mmol/L (ref 3.5–5.1)
Sodium: 140 mmol/L (ref 135–145)
Total Bilirubin: 0.5 mg/dL (ref 0.3–1.2)
Total Protein: 5.8 g/dL — ABNORMAL LOW (ref 6.5–8.1)

## 2021-04-27 MED ORDER — AEROCHAMBER PLUS FLO-VU LARGE MISC
Status: AC
  Administered 2021-04-27: 1
  Filled 2021-04-27: qty 1

## 2021-04-27 MED ORDER — ACETAMINOPHEN 500 MG PO TABS
1000.0000 mg | ORAL_TABLET | Freq: Once | ORAL | Status: AC
  Administered 2021-04-27: 1000 mg via ORAL
  Filled 2021-04-27: qty 2

## 2021-04-27 MED ORDER — FENTANYL CITRATE (PF) 100 MCG/2ML IJ SOLN
50.0000 ug | Freq: Once | INTRAMUSCULAR | Status: AC
  Administered 2021-04-27: 50 ug via INTRAVENOUS
  Filled 2021-04-27: qty 2

## 2021-04-27 MED ORDER — ALBUTEROL SULFATE HFA 108 (90 BASE) MCG/ACT IN AERS
2.0000 | INHALATION_SPRAY | Freq: Once | RESPIRATORY_TRACT | Status: AC
  Administered 2021-04-27: 2 via RESPIRATORY_TRACT
  Filled 2021-04-27: qty 6.7

## 2021-04-27 NOTE — ED Notes (Signed)
Wound cleansed with NS, applied xeroform, covered with 2x2 gauze, and secured with medipore tape.

## 2021-04-27 NOTE — ED Notes (Signed)
Dr. Leonette Monarch came to bedside to reassess patient. New order for albuterol received.

## 2021-04-27 NOTE — ED Notes (Signed)
Pt's O2 sat decreased to 88-89% on RA. Dr. Leonette Monarch was notified. Verbally stimulated pt, had pt cough to clear lungs. O2 sat increased to 94% after coughing.

## 2021-04-27 NOTE — ED Notes (Signed)
Dr. Leonette Monarch is at bedside at this time.

## 2021-04-27 NOTE — ED Notes (Signed)
Pt transported to radiology at this time °

## 2021-04-27 NOTE — ED Notes (Signed)
Pt fell asleep and O2 sat decreased to 88% on RA. Applied O2 at 2lpm via [place].

## 2021-04-27 NOTE — ED Notes (Signed)
Dr. Cardama at bedside at this time.  

## 2021-04-27 NOTE — ED Notes (Signed)
Sent Dr. Leonette Monarch a secure chat notifying that Pt's O2 sat is not above 89% on RA.

## 2021-04-27 NOTE — Telephone Encounter (Signed)
Patient presented to ED for fall at home. Has since been discharged, but stated that she'd like to cancel her [facility] appointment for IVF this afternoon so that she can rest. Will keep 5/5 IVF appt. Dr Grier Mitts nurse made aware.

## 2021-04-27 NOTE — ED Notes (Signed)
Provider at the bedside.  

## 2021-04-27 NOTE — ED Provider Notes (Signed)
[facility] EMERGENCY DEPARTMENT Provider Note  CSN: [id number] Arrival date & time: 04/27/21 0321  Chief Complaint(s) Fall, Head Injury, and Arm Injury  HPI [name] [name] is a 75 y.o. female with a past medical history listed below who presents to the emergency department as a level 2 trauma after a fall at home resulting in head injury.  It was reported that the patient was on blood thinners but she is only on Plavix.  Patient complains of left-sided forehead pain where she developed a large hematoma from the fall.  She denies any neck pain.  Endorses right elbow and wrist pain as well as left forearm pain.  Denies any chest pain or back pain.  No abdominal pain.  No hip pain or lower extremity pain.  Denies any other physical complaints.  HPI  Past Medical History Past Medical History:  Diagnosis Date  . Brain tumor ([place])   . CKD (chronic kidney disease)   . CLL (chronic lymphocytic leukemia) ([place])   . COPD (chronic obstructive pulmonary disease) ([place])   . CVA (cerebral vascular accident) ([place])   . Diabetes ([place])   . GERD (gastroesophageal reflux disease)   . Heart murmur   . Hypertension   . Leukemia ([place])   . PVD (peripheral vascular disease) ([place])    There are no problems to display for this patient.  Home Medication(s) Prior to Admission medications   Medication Sig Start Date End Date Taking? Authorizing Provider  albuterol (ACCUNEB) 0.63 MG/3ML nebulizer solution Take 3 mLs by nebulization. 04/23/21   [provider]  albuterol (VENTOLIN HFA) 108 (90 Base) MCG/ACT inhaler Inhale 1-2 puffs into the lungs every 6 (six) hours as needed. 04/23/21   [provider]  allopurinol (ZYLOPRIM) 300 MG tablet Take 150 mg by mouth 2 (two) times daily. 04/23/21   [provider]  amLODipine (NORVASC) 5 MG tablet Take 5 mg by mouth daily. 04/23/21   [provider]  atorvastatin (LIPITOR) 80 MG tablet Take 80 mg by mouth daily.  04/23/21   [provider]  BREO ELLIPTA 100-25 MCG/INH AEPB Inhale 1 puff into the lungs daily. 04/23/21   [provider]  clopidogrel (PLAVIX) 75 MG tablet Take 75 mg by mouth daily. 04/23/21   [provider]  cyclobenzaprine (FLEXERIL) 10 MG tablet Take 10 mg by mouth 3 (three) times daily. 04/23/21   [provider]  DULoxetine (CYMBALTA) 60 MG capsule Take 60 mg by mouth daily. 04/23/21   [provider]  famotidine (PEPCID) 20 MG tablet Take 20 mg by mouth 2 (two) times daily. 04/23/21   [provider]  furosemide (LASIX) 20 MG tablet Take 20 mg by mouth daily. 04/23/21   [provider]  gabapentin (NEURONTIN) 300 MG capsule Take 600 mg by mouth 2 (two) times daily. 04/23/21   [provider]  hydrALAZINE (APRESOLINE) 50 MG tablet Take 50 mg by mouth 3 (three) times daily. 04/23/21   [provider]  JARDIANCE 25 MG TABS tablet Take 25 mg by mouth daily. 04/23/21   [provider]  metFORMIN (GLUCOPHAGE) 500 MG tablet Take 500 mg by mouth daily. 04/23/21   [provider]  montelukast (SINGULAIR) 10 MG tablet Take 10 mg by mouth daily. 04/23/21   [provider]  traMADol (ULTRAM) 50 MG tablet Take 50 mg by mouth 2 (two) times daily as needed. 04/23/21   [provider]  TRULICITY 1.24 PY/0.9XI SOPN Inject 0.75 mg into  the skin once a week. 01/27/21   [provider]  VENCLEXTA STARTING PACK 10 & 50 & 100 MG Starter Pack Take 10-100 mg by mouth See admin instructions. As directed on starter pack 04/16/21   [provider]                                                                                                                                    Past Surgical History Past Surgical History:  Procedure Laterality Date  . BRAIN TUMOR EXCISION     Family History History reviewed. No pertinent family history.  Social History Social History   Tobacco Use  .  Smoking status: Current Every Day Smoker  . Smokeless tobacco: Never Used  Vaping Use  . Vaping Use: Never used  Substance Use Topics  . Alcohol use: Not Currently  . Drug use: Not Currently   Allergies Aspirin  Review of Systems Review of Systems All other systems are reviewed and are negative for acute change except as noted in the HPI  Physical Exam Vital Signs  I have reviewed the triage vital signs BP 102/69   Pulse 69   Temp 98.2 F (36.8 C) (Oral)   Resp 11   Ht 5\' 2"  (1.575 m)   Wt 66 kg   SpO2 97%   BMI 26.61 kg/m   Physical Exam Constitutional:      General: She is not in acute distress.    Appearance: She is well-developed. She is not diaphoretic.  HENT:     Head: Normocephalic. Contusion present.      Right Ear: External ear normal.     Left Ear: External ear normal.     Nose: Nose normal.  Eyes:     General: No scleral icterus.       Right eye: No discharge.        Left eye: No discharge.     Extraocular Movements: Extraocular movements intact.     Conjunctiva/sclera: Conjunctivae normal.     Pupils: Pupils are equal, round, and reactive to light.  Cardiovascular:     Rate and Rhythm: Normal rate and regular rhythm.     Pulses:          Radial pulses are 2+ on the right side and 2+ on the left side.       Dorsalis pedis pulses are 2+ on the right side and 2+ on the left side.     Heart sounds: Normal heart sounds. No murmur heard. No friction rub. No gallop.   Pulmonary:     Effort: Pulmonary effort is normal. No respiratory distress.     Breath sounds: Normal breath sounds. No stridor. No wheezing.  Abdominal:     General: There is no distension.     Palpations: Abdomen is soft.     Tenderness: There is no abdominal tenderness.  Musculoskeletal:     Right elbow: No swelling  or deformity. Tenderness present.     Left forearm: No deformity, tenderness or bony tenderness.     Right wrist: Tenderness and bony tenderness present. No swelling,  deformity or snuff box tenderness. Normal range of motion.     Cervical back: Normal range of motion and neck supple. No bony tenderness.     Thoracic back: No bony tenderness.     Lumbar back: No bony tenderness.     Comments: Clavicles stable. Chest stable to AP/Lat compression. Pelvis stable to Lat compression. No obvious extremity deformity. No chest or abdominal wall contusion.  Skin:    General: Skin is warm and dry.     Findings: No erythema or rash.  Neurological:     Mental Status: She is alert and oriented to person, place, and time.     Comments: Moving all extremities     ED Results and Treatments Labs (all labs ordered are listed, but only abnormal results are displayed) Labs Reviewed  CBC WITH DIFFERENTIAL/PLATELET - Abnormal; Notable for the following components:      Result Value   WBC 27.0 (*)    RBC 3.59 (*)    Hemoglobin 9.9 (*)    HCT 32.2 (*)    RDW 25.4 (*)    Lymphs Abs 13.0 (*)    Monocytes Absolute 9.6 (*)    All other components within normal limits  COMPREHENSIVE METABOLIC PANEL - Abnormal; Notable for the following components:   Glucose, Bld 110 (*)    Creatinine, Ser 1.70 (*)    Calcium 8.6 (*)    Total Protein 5.8 (*)    Alkaline Phosphatase 235 (*)    GFR, Estimated 31 (*)    All other components within normal limits  I-STAT CHEM 8, ED - Abnormal; Notable for the following components:   Creatinine, Ser 1.70 (*)    Glucose, Bld 106 (*)    Calcium, Ion 1.13 (*)    Hemoglobin 10.9 (*)    HCT 32.0 (*)    All other components within normal limits                                                                                                                         EKG  EKG Interpretation  Date/Time:  Monday Apr 27 2021 03:29:18 EDT Ventricular Rate:  77 PR Interval:  155 QRS Duration: 89 QT Interval:  401 QTC Calculation: 454 R Axis:   73 Text Interpretation: Sinus rhythm Atrial premature complex Minimal ST depression, inferior leads  NO STEMI. No old tracing to compare Confirmed by Addison Lank [phone]) on 04/27/2021 4:35:10 AM      Radiology DG ELBOW COMPLETE LEFT (3+VIEW)  Result Date: 04/27/2021 CLINICAL DATA:  75 year old female status post fall. On blood thinners. EXAM: LEFT ELBOW - COMPLETE 3+ VIEW COMPARISON:  Left forearm series today. FINDINGS: Bone mineralization is within normal limits for age. No evidence of joint effusion. Left radial head appears intact. Distal humerus and proximal ulna appear  intact. No acute osseous abnormality identified. Several punctate calcific foci again noted in the forearm. IMPRESSION: No acute fracture or dislocation identified about the left elbow. Electronically Signed   By: Genevie Ann M.D.   On: 04/27/2021 04:45   DG ELBOW COMPLETE RIGHT (3+VIEW)  Result Date: 04/27/2021 CLINICAL DATA:  75 year old female status post fall. On blood thinners. EXAM: RIGHT ELBOW - COMPLETE 3+ VIEW COMPARISON:  None. FINDINGS: No evidence of joint effusion. Bone mineralization is within normal limits for age. Radial head appears intact. Distal humerus appears intact with small chronic ossific fragment at the lateral epicondyle. No acute osseous abnormality identified. No discrete soft tissue injury. IMPRESSION: No acute fracture or dislocation identified about the right elbow. Electronically Signed   By: Genevie Ann M.D.   On: 04/27/2021 04:44   DG Forearm Left  Result Date: 04/27/2021 CLINICAL DATA:  75 year old female status post fall. On blood thinners. EXAM: LEFT FOREARM - 2 VIEW COMPARISON:  Left wrist series today reported separately. FINDINGS: Stable alignment at the left wrist. Bone mineralization is within normal limits. The left radius and ulna appear intact. Alignment at the left elbow appears maintained. There are 1 or 2 punctate calcific foci in the forearm soft tissues. But otherwise no discrete soft tissue injury. IMPRESSION: No acute fracture or dislocation identified about the left forearm.  Electronically Signed   By: Genevie Ann M.D.   On: 04/27/2021 04:43   DG Wrist Complete Left  Result Date: 04/27/2021 CLINICAL DATA:  75 year old female status post fall. On blood thinners. EXAM: LEFT WRIST - COMPLETE 3+ VIEW COMPARISON:  None. FINDINGS: Bone mineralization is within normal limits for age. The lateral view is oblique. Distal radius and ulna appear intact. Carpal bone alignment and joint spaces are within normal limits for age. No carpal or metacarpal fracture identified. Incidental IV access artifact. No discrete soft tissue injury. IMPRESSION: No acute fracture or dislocation identified about the left wrist. Electronically Signed   By: Genevie Ann M.D.   On: 04/27/2021 04:42   DG Wrist Complete Right  Result Date: 04/27/2021 CLINICAL DATA:  75 year old female status post fall. On blood thinners. EXAM: RIGHT WRIST - COMPLETE 3+ VIEW COMPARISON:  None. FINDINGS: Bone mineralization is within normal limits for age. Distal radius and ulna are intact. Carpal bone alignment and joint spaces are within normal limits for age. No acute metacarpal fracture. Possible healed 5th metacarpal fracture. No acute osseous abnormality identified. Mild soft tissue swelling. IMPRESSION: No acute fracture or dislocation identified about the right wrist. Electronically Signed   By: Genevie Ann M.D.   On: 04/27/2021 04:41   CT Head Wo Contrast  Result Date: 04/27/2021 CLINICAL DATA:  75 year old female status post fall. On blood thinners. History of prior meningioma resection. EXAM: CT HEAD WITHOUT CONTRAST TECHNIQUE: Contiguous axial images were obtained from the base of the skull through the vertex without intravenous contrast. COMPARISON:  Brain MRI 02/23/2016.  Head CT 06/24/2015. Face and cervical spine CT today reported separately. FINDINGS: Brain: Chronic encephalomalacia at the left superior perirolandic cortex underlying chronic craniotomy defect. Elsewhere cerebral volume remains normal for age. No midline shift,  ventriculomegaly, mass effect, evidence of mass lesion, intracranial hemorrhage or evidence of cortically based acute infarction. Stable gray-white matter differentiation throughout the brain. Vascular: Calcified atherosclerosis at the skull base. No suspicious intracranial vascular hyperdensity. Skull: Chronic left posterior vertex craniotomy. No acute osseous abnormality identified. Sinuses/Orbits: Visualized paranasal sinuses and mastoids are stable and well aerated. Other: Large, broad-based  left scalp hematoma tracks over the left orbit in the preseptal space, and toward the vertex. No scalp soft tissue gas. Left globe and intraorbital soft tissues appear to remain normal. See also Face CT today reported separately. IMPRESSION: 1. Large left scalp hematoma tracking over the left orbit in the preseptal space. No underlying skull fracture identified. See also Face CT reported separately. 2. No acute intracranial abnormality. Stable left superior hemisphere encephalomalacia from remote meningioma resection. Electronically Signed   By: Genevie Ann M.D.   On: 04/27/2021 04:51   CT Cervical Spine Wo Contrast  Result Date: 04/27/2021 CLINICAL DATA:  75 year old female status post fall. On blood thinners. EXAM: CT CERVICAL SPINE WITHOUT CONTRAST TECHNIQUE: Multidetector CT imaging of the cervical spine was performed without intravenous contrast. Multiplanar CT image reconstructions were also generated. COMPARISON:  Head and face CT today reported separately. Report of cervical spine MRI 02/22/2000 (no images available). FINDINGS: Alignment: Straightening of cervical lordosis. Cervicothoracic junction alignment is within normal limits. Bilateral posterior element alignment is within normal limits. Skull base and vertebrae: Visualized skull base is intact. No atlanto-occipital dissociation. C1 and C2 appear intact and aligned. No acute osseous abnormality identified. Soft tissues and spinal canal: No prevertebral fluid or  swelling. No visible canal hematoma. Mild motion artifact at the pharynx. Calcified carotid atherosclerosis but otherwise negative visible neck soft tissues. Disc levels: Dominant cervical spine degenerative finding is facet arthropathy, with evidence of developing degenerative facet ankylosis at C2-C3 on the left, bilaterally at C6-C7. Disc and endplate degeneration is maximal at C4-C5. Mild if any associated spinal stenosis. Upper chest: Visible upper thoracic levels appear intact. Centrilobular emphysema in the lung apices. Calcified aortic atherosclerosis. Other: Small sebaceous cyst anterior to the left sternoclavicular joint, inconsequential. IMPRESSION: 1.  No acute traumatic injury identified in the cervical spine. 2. Facet degeneration with evidence of developing degenerative facet ankylosis at several levels. 3. Aortic Atherosclerosis (ICD10-I70.0) and Emphysema (ICD10-J43.9). Electronically Signed   By: Genevie Ann M.D.   On: 04/27/2021 04:59   CT Maxillofacial Wo Contrast  Result Date: 04/27/2021 CLINICAL DATA:  75 year old female status post fall. On blood thinners. History of prior meningioma resection. EXAM: CT MAXILLOFACIAL WITHOUT CONTRAST TECHNIQUE: Multidetector CT imaging of the maxillofacial structures was performed. Multiplanar CT image reconstructions were also generated. COMPARISON:  Head CT today reported separately,  06/04/2015. FINDINGS: Osseous: Carious and absent bilateral dentition. Mandible intact and normally located. No maxilla fracture. No zygoma fracture. Pterygoid plates intact. No nasal bone fracture. Central skull base and visible calvarium appear intact. Cervical spine detailed separately. Orbits: No orbital wall fracture. Globes appear symmetric and intact. No intraorbital gas or hematoma. Left preseptal space hematoma contiguous with that of the forehead. Sinuses: Mild chronic paranasal sinus mucosal thickening has not significantly changed since 2016. Tympanic cavities and  mastoids are clear. Soft tissues: Retained secretions in the nasopharynx. Calcified carotid atherosclerosis in the neck. Otherwise negative visible noncontrast deep soft tissue spaces of the face. Limited intracranial: Stable to that reported separately. IMPRESSION: 1. Relatively large superficial left forehead and preseptal/periorbital hematoma. No underlying or acute facial fracture identified. 2. Poor dentition. Electronically Signed   By: Genevie Ann M.D.   On: 04/27/2021 04:55    Pertinent labs & imaging results that were available during my care of the patient were reviewed by me and considered in my medical decision making (see chart for details).  Medications Ordered in ED Medications  fentaNYL (SUBLIMAZE) injection 50 mcg (50 mcg Intravenous Given 04/27/21  0349)                                                                                                                                    Procedures Procedures  (including critical care time)  Medical Decision Making / ED Course I have reviewed the nursing notes for this encounter and the patient's prior records (if available in EHR or on provided paperwork).   SUMI LYE was evaluated in Emergency Department on 04/27/2021 for the symptoms described in the history of present illness. She was evaluated in the context of the global COVID-19 pandemic, which necessitated consideration that the patient might be at risk for infection with the SARS-CoV-2 virus that causes COVID-19. Institutional protocols and algorithms that pertain to the evaluation of patients at risk for COVID-19 are in a state of rapid change based on information released by regulatory bodies including the CDC and federal and state organizations. These policies and algorithms were followed during the patient's care in the ED.  Fall at home CT head notable for large frontal hematoma.  No underlying fracture or intracranial hemorrhage.  CT of the face negative for any  fractures.  CT of the cervical spine negative for any fractures. Plain films of the upper extremities negative as well.  Labs consistent with her known CLL and CKD.        Final Clinical Impression(s) / ED Diagnoses Final diagnoses:  Fall  Fall in home, initial encounter  Traumatic hematoma of forehead, initial encounter  Multiple contusions    The patient appears reasonably screened and/or stabilized for discharge and I doubt any other medical condition or other [facility] requiring further screening, evaluation, or treatment in the ED at this time prior to discharge. Safe for discharge with strict return precautions.  Disposition: Discharge  Condition: Good  I have discussed the results, Dx and Tx plan with the patient/family who expressed understanding and agree(s) with the plan. Discharge instructions discussed at length. The patient/family was given strict return precautions who verbalized understanding of the instructions. No further questions at time of discharge.    ED Discharge Orders    None       Follow Up: Primary care provider  Call  to schedule an appointment for close follow up     This chart was dictated using voice recognition software.  Despite best efforts to proofread,  errors can occur which can change the documentation meaning.   Fatima Blank, MD 04/27/21 [phone]

## 2021-04-28 ENCOUNTER — Other Ambulatory Visit: Payer: Self-pay | Admitting: *Deleted

## 2021-04-29 ENCOUNTER — Other Ambulatory Visit: Payer: Self-pay | Admitting: Hematology

## 2021-04-29 DIAGNOSIS — C9110 Chronic lymphocytic leukemia of B-cell type not having achieved remission: Secondary | ICD-10-CM

## 2021-04-29 LAB — HM DIABETES EYE EXAM

## 2021-04-29 NOTE — Progress Notes (Unsigned)
[facility] referral

## 2021-04-29 NOTE — Telephone Encounter (Signed)
Given kidney disease, I am discontinuing Metformin at this time and not provided refills. I am also reducing the Cymbalta to 30mg  daily.  Will further discuss and adjust medications at follow up on 05/04/21

## 2021-04-30 ENCOUNTER — Inpatient Hospital Stay: Payer: Medicare Other | Attending: Hematology

## 2021-04-30 ENCOUNTER — Other Ambulatory Visit: Payer: Self-pay

## 2021-04-30 ENCOUNTER — Inpatient Hospital Stay: Payer: Medicare Other

## 2021-04-30 VITALS — BP 116/76 | HR 72 | Temp 98.3°F | Resp 18

## 2021-04-30 DIAGNOSIS — M7989 Other specified soft tissue disorders: Secondary | ICD-10-CM | POA: Diagnosis not present

## 2021-04-30 DIAGNOSIS — F17210 Nicotine dependence, cigarettes, uncomplicated: Secondary | ICD-10-CM | POA: Insufficient documentation

## 2021-04-30 DIAGNOSIS — C9110 Chronic lymphocytic leukemia of B-cell type not having achieved remission: Secondary | ICD-10-CM | POA: Diagnosis not present

## 2021-04-30 DIAGNOSIS — R918 Other nonspecific abnormal finding of lung field: Secondary | ICD-10-CM | POA: Diagnosis not present

## 2021-04-30 DIAGNOSIS — D649 Anemia, unspecified: Secondary | ICD-10-CM | POA: Insufficient documentation

## 2021-04-30 DIAGNOSIS — Z298 Encounter for other specified prophylactic measures: Secondary | ICD-10-CM | POA: Diagnosis present

## 2021-04-30 DIAGNOSIS — E860 Dehydration: Secondary | ICD-10-CM

## 2021-04-30 LAB — CMP (CANCER CENTER ONLY)
ALT: <6 U/L (ref 0–44)
AST: 14 U/L — ABNORMAL LOW (ref 15–41)
Albumin: 3.7 g/dL (ref 3.5–5.0)
Alkaline Phosphatase: 245 U/L — ABNORMAL HIGH (ref 38–126)
Anion gap: 10 (ref 5–15)
BUN: 15 mg/dL (ref 8–23)
CO2: 28 mmol/L (ref 22–32)
Calcium: 9.3 mg/dL (ref 8.9–10.3)
Chloride: 104 mmol/L (ref 98–111)
Creatinine: 1.64 mg/dL — ABNORMAL HIGH (ref 0.44–1.00)
GFR, Estimated: 33 mL/min — ABNORMAL LOW (ref >60)
Glucose, Bld: 96 mg/dL (ref 70–99)
Potassium: 3.7 mmol/L (ref 3.5–5.1)
Sodium: 142 mmol/L (ref 135–145)
Total Bilirubin: 0.4 mg/dL (ref 0.3–1.2)
Total Protein: 6.3 g/dL — ABNORMAL LOW (ref 6.5–8.1)

## 2021-04-30 LAB — CBC WITH DIFFERENTIAL/PLATELET
Abs Immature Granulocytes: 0 10*3/uL (ref 0.00–0.07)
Basophils Absolute: 0 10*3/uL (ref 0.0–0.1)
Basophils Relative: 0 %
Eosinophils Absolute: 0.3 10*3/uL (ref 0.0–0.5)
Eosinophils Relative: 2 %
HCT: 33.3 % — ABNORMAL LOW (ref 36.0–46.0)
Hemoglobin: 10.4 g/dL — ABNORMAL LOW (ref 12.0–15.0)
Lymphocytes Relative: 59 %
Lymphs Abs: 9.1 10*3/uL — ABNORMAL HIGH (ref 0.7–4.0)
MCH: 28 pg (ref 26.0–34.0)
MCHC: 31.2 g/dL (ref 30.0–36.0)
MCV: 89.8 fL (ref 80.0–100.0)
Monocytes Absolute: 0.6 10*3/uL (ref 0.1–1.0)
Monocytes Relative: 4 %
Neutro Abs: 5.4 10*3/uL (ref 1.7–7.7)
Neutrophils Relative %: 35 %
Platelets: 261 10*3/uL (ref 150–400)
RBC: 3.71 MIL/uL — ABNORMAL LOW (ref 3.87–5.11)
RDW: 24.2 % — ABNORMAL HIGH (ref 11.5–15.5)
WBC: 15.4 10*3/uL — ABNORMAL HIGH (ref 4.0–10.5)
nRBC: 0 % (ref 0.0–0.2)

## 2021-04-30 LAB — URIC ACID: Uric Acid, Serum: 3 mg/dL (ref 2.5–7.1)

## 2021-04-30 MED ORDER — SODIUM CHLORIDE 0.9 % IV SOLN
Freq: Once | INTRAVENOUS | Status: AC
  Filled 2021-04-30: qty 250

## 2021-04-30 NOTE — Patient Instructions (Signed)
Rehydration, Adult Rehydration is the replacement of body fluids, salts, and minerals (electrolytes) that are lost during dehydration. Dehydration is when there is not enough water or other fluids in the body. This happens when you lose more fluids than you take in. Common causes of dehydration include:  Not drinking enough fluids. This can occur when you are ill or doing activities that require a lot of energy, especially in hot weather.  Conditions that cause loss of water or other fluids, such as diarrhea, vomiting, sweating, or urinating a lot.  Other illnesses, such as fever or infection.  Certain medicines, such as those that remove excess fluid from the body (diuretics). Symptoms of mild or moderate dehydration may include thirst, dry lips and mouth, and dizziness. Symptoms of severe dehydration may include increased heart rate, confusion, fainting, and not urinating. For severe dehydration, you may need to get fluids through an IV at the hospital. For mild or moderate dehydration, you can usually rehydrate at home by drinking certain fluids as told by your health care provider. What are the risks? Generally, rehydration is safe. However, taking in too much fluid (overhydration) can be a problem. This is rare. Overhydration can cause an electrolyte imbalance, kidney failure, or a decrease in salt (sodium) levels in the body. Supplies needed You will need an oral rehydration solution (ORS) if your health care provider tells you to use one. This is a drink to treat dehydration. It can be found in pharmacies and retail stores. How to rehydrate Fluids Follow instructions from your health care provider for rehydration. The kind of fluid and the amount you should drink depend on your condition. In general, you should choose drinks that you prefer.  If told by your health care provider, drink an ORS. ? Make an ORS by following instructions on the package. ? Start by drinking small amounts,  about  cup (120 mL) every 5-10 minutes. ? Slowly increase how much you drink until you have taken the amount recommended by your health care provider.  Drink enough clear fluids to keep your urine pale yellow. If you were told to drink an ORS, finish it first, then start slowly drinking other clear fluids. Drink fluids such as: ? Water. This includes sparkling water and flavored water. Drinking only water can lead to having too little sodium in your body (hyponatremia). Follow the advice of your health care provider. ? Water from ice chips you suck on. ? Fruit juice with water you add to it (diluted). ? Sports drinks. ? Hot or cold herbal teas. ? Broth-based soups. ? Milk or milk products. Food Follow instructions from your health care provider about what to eat while you rehydrate. Your health care provider may recommend that you slowly begin eating regular foods in small amounts.  Eat foods that contain a healthy balance of electrolytes, such as bananas, oranges, potatoes, tomatoes, and spinach.  Avoid foods that are greasy or contain a lot of sugar. In some cases, you may get nutrition through a feeding tube that is passed through your nose and into your stomach (nasogastric tube, or NG tube). This may be done if you have uncontrolled vomiting or diarrhea.   Beverages to avoid Certain beverages may make dehydration worse. While you rehydrate, avoid drinking alcohol.   How to tell if you are recovering from dehydration You may be recovering from dehydration if:  You are urinating more often than before you started rehydrating.  Your urine is pale yellow.  Your energy level   improves.  You vomit less frequently.  You have diarrhea less frequently.  Your appetite improves or returns to normal.  You feel less dizzy or less light-headed.  Your skin tone and color start to look more normal. Follow these instructions at home:  Take over-the-counter and prescription medicines only  as told by your health care provider.  Do not take sodium tablets. Doing this can lead to having too much sodium in your body (hypernatremia). Contact a health care provider if:  You continue to have symptoms of mild or moderate dehydration, such as: ? Thirst. ? Dry lips. ? Slightly dry mouth. ? Dizziness. ? Dark urine or less urine than normal. ? Muscle cramps.  You continue to vomit or have diarrhea. Get help right away if you:  Have symptoms of dehydration that get worse.  Have a fever.  Have a severe headache.  Have been vomiting and the following happens: ? Your vomiting gets worse or does not go away. ? Your vomit includes blood or green matter (bile). ? You cannot eat or drink without vomiting.  Have problems with urination or bowel movements, such as: ? Diarrhea that gets worse or does not go away. ? Blood in your stool (feces). This may cause stool to look black and tarry. ? Not urinating, or urinating only a small amount of very dark urine, within 6-8 hours.  Have trouble breathing.  Have symptoms that get worse with treatment. These symptoms may represent a serious problem that is an emergency. Do not wait to see if the symptoms will go away. Get medical help right away. Call your local emergency services (911 in the [place] Do not drive yourself to the hospital. Summary  Rehydration is the replacement of body fluids and minerals (electrolytes) that are lost during dehydration.  Follow instructions from your health care provider for rehydration. The kind of fluid and amount you should drink depend on your condition.  Slowly increase how much you drink until you have taken the amount recommended by your health care provider.  Contact your health care provider if you continue to show signs of mild or moderate dehydration. This information is not intended to replace advice given to you by your health care provider. Make sure you discuss any questions you have with  your health care provider. Document Revised: 02/13/2020 Document Reviewed: 12/24/2019 Elsevier Patient Education  2021 Elsevier Inc.  

## 2021-05-01 ENCOUNTER — Ambulatory Visit: Payer: Medicare Other

## 2021-05-01 ENCOUNTER — Other Ambulatory Visit: Payer: Medicare Other

## 2021-05-02 ENCOUNTER — Other Ambulatory Visit: Payer: Self-pay | Admitting: Family Medicine

## 2021-05-02 NOTE — Progress Notes (Signed)
Subjective:   Patient ID: [name] [name]    DOB: [birth year], 75 y.o. female   MRN: [id number]  [name] [name] is a 75 y.o. female with a history of HFrEF, T2DM, HTN, PAD, stroke, venous insufficiency, asthma/COPD, GERD, CKD III, allergy, anxiety, chronic back pain, CLL, headache, heart murmur, HLD, IDA, mass of left lower leg, pulmonary nodules, tobacco dependence here for diabetes follow up  Acute Concerns:  Fall at home. She has a bedside commode and was getting up to use the restroom. [place] out of the bed and landed face down. Denies LOC.  Went to ED on 5/2. Bilateral wrist and elbow were without fracture. CT head with large left scalp hematoma tracking over left orbit in preseptal space, no acute intracranial abnormality. CT cervical spine without traumatic injury, does note facet degeneration with evidence of developing degenerative facet ankylosis at several levels. CT maxillary notable for Relatively large superficial left forehead and preseptal/periorbital hematoma. Uses rolling walker at home. Unsure why she fell. Hgb 10.4, CBC normal electrolytes. Denies any chest pain or Sob. Notes her right shoulder is really bothering her and her right index finger. She has been treating the pain with Tylenol PRN.  Diabetes: Last three A1C's below. Currently on Trulicity 0.75mg  qweekly, Jardiance 10mg  QD, Metformin 500mg  QD. Prescribed Trulicity 0.75mg  weekly but hasnt been taken it. Endorses compliance. Denies any hypoglycemia. Denies any polyuria, polydipsia, polyphagia. Due for diabetic foot exam.  Lab Results  Component Value Date   HGBA1C 6.5 05/04/2021   HGBA1C 8.0 (A) 01/06/2021   HGBA1C 7.4 (H) 09/24/2020    HTN:  BP: (!) 166/82 today. Currently on Amlodipine 5mg  QD, Hydralazine 50mg  TID. Has not taken medications this morning. Endorses compliance. Current everyday smoker. Denies any chest pain, SOB, vision changes, or headaches.   Lab Results  Component Value Date   CREATININE 1.48  (H) 05/04/2021   CREATININE 1.64 (H) 04/30/2021   CREATININE 1.70 (H) 04/27/2021    HLD: Last lipid panel below. Currently on Atorvastatin 80mg  QD. Endorses compliance. Denies any muscles aches or weakness.  Lab Results  Component Value Date   CHOL 103 10/04/2020   HDL 24 (L) 10/04/2020   LDLCALC 49 10/04/2020   LDLDIRECT 62 09/24/2010   TRIG 151 (H) 10/04/2020   CHOLHDL 4.3 10/04/2020   Review of Systems:  Per HPI.   Objective:   BP (!) 166/82   Pulse 78   Wt 136 lb 12.8 oz (62.1 kg)   BMI 25.02 kg/m  Vitals and nursing note reviewed.  General: pleasant older female, sitting comfortably in exam chair, well nourished, well developed, in no acute distress with non-toxic appearance HEENT: eyes normal appearing CV: regular rate and rhythm, 2/6 systolic murmur best heard over left upper sternal border, no rubs or gallops Lungs: clear to auscultation bilaterally with normal work of breathing on room air, speaking in full sentences Skin: warm, dry, superficial abrasion on mid forehead that appears very well healing, hypopigmentation with overlying hyperpigmentation but no erythema or warmth, no bleeding. Adjacent nodule without ecchymosis  Extremities: warm and well perfused, normal tone, Right 2nd finger with swelling (diffuse but more between MCP and PIP), very tender to palpation. Neurovascularly intact. Good ROM at MCP but pain with any ROM of DIP and PIP. Remaining hand normal. MSK: gait normal with rolling walker Neuro: Alert and oriented, speech normal, EOMI   Assessment & Plan:   DM (diabetes mellitus), type 2 with peripheral vascular complications (HCC) Chronic. Improved. A1C improved  from 8.0 to 6.5 today. Unclear if CLL could interfere with A1C level. Prior random CMP/BMP's with glucose level of 90-100. A1C goal <8 - Continue Metformin 500mg  QD as long as GFR >30 - Continue Jardiance 25mg  QD - Has not been taking Trulicity. Will hold off on restarted given A1C level  and age.  - follow up 3 months to monitor. Recommend diabetic foot exam at that time (unable to be completed today due to other acute concerns).  Essential hypertension Chronic, elevated today in setting of not taking medications this morning. Refills are scheduled to arrive at house today. - continue Amlodipine and Hydralazine  HYPERCHOLESTEROLEMIA Chronic. Continue Atorvastatin  Chronic kidney disease (CKD), stage III (moderate) (HCC) Chronic, stable. Last GFR 33 in May 2022. Medications reviewed and adjusted.  Metformin 500mg  QD okay if GFR>30 Duloxetine decreased to 30mg . If continues to worsen,w ill need to taper off. Jardiance 10mg  okay as long as GFR >20 Hydralazine okay as long as GFR >10 Max Gabapentin 600mg /day (divided doses) Tramadol IR okay with 200mg /day max (avoid ER) No dose adjustments: trulicity, amlodipine, Atorvastatin, Plavix, Flexeril  Fall Acute. Fall from bed on 5/2. No prodromal symptoms. No LOC. Work up in ED without traumatic injury. Does have some trapezius muscle pain on right and right index finger pain and swelling. Ambulates well without weakness using rolling walker. Most recent blood work on 5/5 with stable hemoglobin, kidney function and electrolytes.  -- x-ray of right index finger - Tylenol for pain with Tramadol 50mg  as needed (no more than BID) - topical analgesics  - heating pad for right trapezius  - Referral to physical therapy  - attempted to transition to Baclofen but has history of intolerance. Extreme caution discussion with tramadol and Flexeril.  - Does have polypharmacy and would likely benefit from med rec and discontinuation where possible. Plan to continue to discuss - Does have 2/6 systolic heart murmur on exam which is chronic per chart review. Last Echo in 2021 notable for EF 70-75% with moderate concentric LV hypertrophy and elevated LV end-diastolic pressure but unable to determine diastolic parameters. RV normal. LA mildly  dilated trivial MV regurg, mild AV regurg with mild stenosis. Can consider repeat if another fall.   Finger pain, right Acute after fall on 04/27/21. Swelling more localized between MCP and PIP, very tender to palpation. Neurovascularly intact. Good ROM at MCP but pain with any ROM of DIP and PIP. Remaining hand normal. - hand x-ray   Orders Placed This Encounter  Procedures  . DG Hand Complete Right    Standing Status:   Future    Standing Expiration Date:   05/04/2022    Order Specific Question:   Reason for Exam (SYMPTOM  OR DIAGNOSIS REQUIRED)    Answer:   Fall, right index finger pain and swelling    Order Specific Question:   Preferred imaging location?    Answer:   [facility]  . Ambulatory referral to Home Health    Referral Priority:   Routine    Referral Type:   Home Health Care    Referral Reason:   Specialty Services Required    Requested Specialty:   Hydro    Number of Visits Requested:   1  . POCT glycosylated hemoglobin (Hb A1C)   Meds ordered this encounter  Medications  . hydrALAZINE (APRESOLINE) 50 MG tablet    Sig: Take 1 tablet (50 mg total) by mouth 3 (three) times daily.    Dispense:  90 tablet    Refill:  2  . traMADol (ULTRAM) 50 MG tablet    Sig: Take 1 tablet (50 mg total) by mouth 2 (two) times daily as needed.    Dispense:  30 tablet    Refill:  0  . metFORMIN (GLUCOPHAGE) 500 MG tablet    Sig: Take 1 tablet (500 mg total) by mouth daily.    Dispense:  90 tablet    Refill:  0  . DULoxetine (CYMBALTA) 30 MG capsule    Sig: Take 1 capsule (30 mg total) by mouth daily.    Dispense:  90 capsule    Refill:  [place], DO PGY-3, [facility] 05/04/2021 1:52 PM

## 2021-05-04 ENCOUNTER — Inpatient Hospital Stay: Payer: Medicare Other

## 2021-05-04 ENCOUNTER — Other Ambulatory Visit: Payer: Self-pay | Admitting: Adult Health

## 2021-05-04 ENCOUNTER — Other Ambulatory Visit: Payer: Self-pay

## 2021-05-04 ENCOUNTER — Ambulatory Visit (INDEPENDENT_AMBULATORY_CARE_PROVIDER_SITE_OTHER): Payer: Medicare Other | Admitting: Family Medicine

## 2021-05-04 VITALS — BP 166/82 | HR 78 | Wt 136.8 lb

## 2021-05-04 DIAGNOSIS — R918 Other nonspecific abnormal finding of lung field: Secondary | ICD-10-CM | POA: Diagnosis not present

## 2021-05-04 DIAGNOSIS — Z298 Encounter for other specified prophylactic measures: Secondary | ICD-10-CM | POA: Diagnosis present

## 2021-05-04 DIAGNOSIS — C9110 Chronic lymphocytic leukemia of B-cell type not having achieved remission: Secondary | ICD-10-CM | POA: Diagnosis not present

## 2021-05-04 DIAGNOSIS — G8929 Other chronic pain: Secondary | ICD-10-CM

## 2021-05-04 DIAGNOSIS — M7989 Other specified soft tissue disorders: Secondary | ICD-10-CM | POA: Diagnosis not present

## 2021-05-04 DIAGNOSIS — W19XXXA Unspecified fall, initial encounter: Secondary | ICD-10-CM | POA: Diagnosis not present

## 2021-05-04 DIAGNOSIS — E1151 Type 2 diabetes mellitus with diabetic peripheral angiopathy without gangrene: Secondary | ICD-10-CM

## 2021-05-04 DIAGNOSIS — M79644 Pain in right finger(s): Secondary | ICD-10-CM | POA: Diagnosis not present

## 2021-05-04 DIAGNOSIS — E7800 Pure hypercholesterolemia, unspecified: Secondary | ICD-10-CM

## 2021-05-04 DIAGNOSIS — I10 Essential (primary) hypertension: Secondary | ICD-10-CM

## 2021-05-04 DIAGNOSIS — E860 Dehydration: Secondary | ICD-10-CM

## 2021-05-04 DIAGNOSIS — M545 Low back pain, unspecified: Secondary | ICD-10-CM

## 2021-05-04 DIAGNOSIS — D649 Anemia, unspecified: Secondary | ICD-10-CM | POA: Diagnosis not present

## 2021-05-04 DIAGNOSIS — N1832 Chronic kidney disease, stage 3b: Secondary | ICD-10-CM

## 2021-05-04 DIAGNOSIS — F17210 Nicotine dependence, cigarettes, uncomplicated: Secondary | ICD-10-CM | POA: Diagnosis not present

## 2021-05-04 LAB — CBC WITH DIFFERENTIAL/PLATELET
Abs Immature Granulocytes: 0.01 10*3/uL (ref 0.00–0.07)
Basophils Absolute: 0 10*3/uL (ref 0.0–0.1)
Basophils Relative: 0 %
Eosinophils Absolute: 0.1 10*3/uL (ref 0.0–0.5)
Eosinophils Relative: 1 %
HCT: 34.1 % — ABNORMAL LOW (ref 36.0–46.0)
Hemoglobin: 10.5 g/dL — ABNORMAL LOW (ref 12.0–15.0)
Immature Granulocytes: 0 %
Lymphocytes Relative: 53 %
Lymphs Abs: 5.1 10*3/uL — ABNORMAL HIGH (ref 0.7–4.0)
MCH: 27.5 pg (ref 26.0–34.0)
MCHC: 30.8 g/dL (ref 30.0–36.0)
MCV: 89.3 fL (ref 80.0–100.0)
Monocytes Absolute: 1.9 10*3/uL — ABNORMAL HIGH (ref 0.1–1.0)
Monocytes Relative: 19 %
Neutro Abs: 2.7 10*3/uL (ref 1.7–7.7)
Neutrophils Relative %: 27 %
Platelets: 254 10*3/uL (ref 150–400)
RBC: 3.82 MIL/uL — ABNORMAL LOW (ref 3.87–5.11)
RDW: 22.5 % — ABNORMAL HIGH (ref 11.5–15.5)
WBC: 9.8 10*3/uL (ref 4.0–10.5)
nRBC: 0 % (ref 0.0–0.2)

## 2021-05-04 LAB — CMP (CANCER CENTER ONLY)
ALT: 7 U/L (ref 0–44)
AST: 15 U/L (ref 15–41)
Albumin: 3.9 g/dL (ref 3.5–5.0)
Alkaline Phosphatase: 227 U/L — ABNORMAL HIGH (ref 38–126)
Anion gap: 11 (ref 5–15)
BUN: 12 mg/dL (ref 8–23)
CO2: 26 mmol/L (ref 22–32)
Calcium: 9.1 mg/dL (ref 8.9–10.3)
Chloride: 105 mmol/L (ref 98–111)
Creatinine: 1.48 mg/dL — ABNORMAL HIGH (ref 0.44–1.00)
GFR, Estimated: 37 mL/min — ABNORMAL LOW (ref >60)
Glucose, Bld: 140 mg/dL — ABNORMAL HIGH (ref 70–99)
Potassium: 3.5 mmol/L (ref 3.5–5.1)
Sodium: 142 mmol/L (ref 135–145)
Total Bilirubin: 0.5 mg/dL (ref 0.3–1.2)
Total Protein: 6.3 g/dL — ABNORMAL LOW (ref 6.5–8.1)

## 2021-05-04 LAB — URIC ACID: Uric Acid, Serum: 3.4 mg/dL (ref 2.5–7.1)

## 2021-05-04 LAB — POCT GLYCOSYLATED HEMOGLOBIN (HGB A1C): HbA1c, POC (controlled diabetic range): 6.5 % (ref 0.0–7.0)

## 2021-05-04 MED ORDER — CYCLOBENZAPRINE HCL 10 MG PO TABS: 10.0000 mg | ORAL_TABLET | Freq: Three times a day (TID) | ORAL | 2 refills | Status: DC | PRN

## 2021-05-04 MED ORDER — CILGAVIMAB (PART OF EVUSHELD) INJECTION
300.0000 mg | Freq: Once | INTRAMUSCULAR | Status: AC
  Administered 2021-05-04: 300 mg via INTRAMUSCULAR
  Filled 2021-05-04: qty 3

## 2021-05-04 MED ORDER — METFORMIN HCL 500 MG PO TABS: 500.0000 mg | ORAL_TABLET | Freq: Every day | ORAL | 0 refills | Status: DC

## 2021-05-04 MED ORDER — DULOXETINE HCL 30 MG PO CPEP: 30.0000 mg | ORAL_CAPSULE | Freq: Every day | ORAL | 1 refills | Status: DC

## 2021-05-04 MED ORDER — TIXAGEVIMAB (PART OF EVUSHELD) INJECTION
300.0000 mg | Freq: Once | INTRAMUSCULAR | Status: AC
  Administered 2021-05-04: 300 mg via INTRAMUSCULAR
  Filled 2021-05-04: qty 3

## 2021-05-04 MED ORDER — SODIUM CHLORIDE 0.9 % IV SOLN
INTRAVENOUS | Status: DC
  Filled 2021-05-04 (×2): qty 250

## 2021-05-04 MED ORDER — TRAMADOL HCL 50 MG PO TABS
50.0000 mg | ORAL_TABLET | Freq: Two times a day (BID) | ORAL | 0 refills | Status: DC | PRN
Start: 2021-05-04 — End: 2021-07-06

## 2021-05-04 MED ORDER — HYDRALAZINE HCL 50 MG PO TABS: 50.0000 mg | ORAL_TABLET | Freq: Three times a day (TID) | ORAL | 2 refills | Status: DC

## 2021-05-04 NOTE — Progress Notes (Signed)
Patient observed without incident for 1 hour following evusheld injections.

## 2021-05-04 NOTE — Assessment & Plan Note (Addendum)
Acute. Fall from bed on 5/2. No prodromal symptoms. No LOC. Work up in ED without traumatic injury. Does have some trapezius muscle pain on right and right index finger pain and swelling. Ambulates well without weakness using rolling walker. Most recent blood work on 5/5 with stable hemoglobin, kidney function and electrolytes.  -- x-ray of right index finger - Tylenol for pain with Tramadol 50mg  as needed (no more than BID) - topical analgesics  - heating pad for right trapezius  - Referral to physical therapy  - attempted to transition to Baclofen but has history of intolerance. Extreme caution discussion with tramadol and Flexeril.  - Does have polypharmacy and would likely benefit from med rec and discontinuation where possible. Plan to continue to discuss - Does have 2/6 systolic heart murmur on exam which is chronic per chart review. Last Echo in 2021 notable for EF 70-75% with moderate concentric LV hypertrophy and elevated LV end-diastolic pressure but unable to determine diastolic parameters. RV normal. LA mildly dilated trivial MV regurg, mild AV regurg with mild stenosis. Can consider repeat if another fall.

## 2021-05-04 NOTE — Patient Instructions (Signed)
Rehydration, Adult Rehydration is the replacement of body fluids, salts, and minerals (electrolytes) that are lost during dehydration. Dehydration is when there is not enough water or other fluids in the body. This happens when you lose more fluids than you take in. Common causes of dehydration include:  Not drinking enough fluids. This can occur when you are ill or doing activities that require a lot of energy, especially in hot weather.  Conditions that cause loss of water or other fluids, such as diarrhea, vomiting, sweating, or urinating a lot.  Other illnesses, such as fever or infection.  Certain medicines, such as those that remove excess fluid from the body (diuretics). Symptoms of mild or moderate dehydration may include thirst, dry lips and mouth, and dizziness. Symptoms of severe dehydration may include increased heart rate, confusion, fainting, and not urinating. For severe dehydration, you may need to get fluids through an IV at the hospital. For mild or moderate dehydration, you can usually rehydrate at home by drinking certain fluids as told by your health care provider. What are the risks? Generally, rehydration is safe. However, taking in too much fluid (overhydration) can be a problem. This is rare. Overhydration can cause an electrolyte imbalance, kidney failure, or a decrease in salt (sodium) levels in the body. Supplies needed You will need an oral rehydration solution (ORS) if your health care provider tells you to use one. This is a drink to treat dehydration. It can be found in pharmacies and retail stores. How to rehydrate Fluids Follow instructions from your health care provider for rehydration. The kind of fluid and the amount you should drink depend on your condition. In general, you should choose drinks that you prefer.  If told by your health care provider, drink an ORS. ? Make an ORS by following instructions on the package. ? Start by drinking small amounts,  about  cup (120 mL) every 5-10 minutes. ? Slowly increase how much you drink until you have taken the amount recommended by your health care provider.  Drink enough clear fluids to keep your urine pale yellow. If you were told to drink an ORS, finish it first, then start slowly drinking other clear fluids. Drink fluids such as: ? Water. This includes sparkling water and flavored water. Drinking only water can lead to having too little sodium in your body (hyponatremia). Follow the advice of your health care provider. ? Water from ice chips you suck on. ? Fruit juice with water you add to it (diluted). ? Sports drinks. ? Hot or cold herbal teas. ? Broth-based soups. ? Milk or milk products. Food Follow instructions from your health care provider about what to eat while you rehydrate. Your health care provider may recommend that you slowly begin eating regular foods in small amounts.  Eat foods that contain a healthy balance of electrolytes, such as bananas, oranges, potatoes, tomatoes, and spinach.  Avoid foods that are greasy or contain a lot of sugar. In some cases, you may get nutrition through a feeding tube that is passed through your nose and into your stomach (nasogastric tube, or NG tube). This may be done if you have uncontrolled vomiting or diarrhea.   Beverages to avoid Certain beverages may make dehydration worse. While you rehydrate, avoid drinking alcohol.   How to tell if you are recovering from dehydration You may be recovering from dehydration if:  You are urinating more often than before you started rehydrating.  Your urine is pale yellow.  Your energy level   improves.  You vomit less frequently.  You have diarrhea less frequently.  Your appetite improves or returns to normal.  You feel less dizzy or less light-headed.  Your skin tone and color start to look more normal. Follow these instructions at home:  Take over-the-counter and prescription medicines only  as told by your health care provider.  Do not take sodium tablets. Doing this can lead to having too much sodium in your body (hypernatremia). Contact a health care provider if:  You continue to have symptoms of mild or moderate dehydration, such as: ? Thirst. ? Dry lips. ? Slightly dry mouth. ? Dizziness. ? Dark urine or less urine than normal. ? Muscle cramps.  You continue to vomit or have diarrhea. Get help right away if you:  Have symptoms of dehydration that get worse.  Have a fever.  Have a severe headache.  Have been vomiting and the following happens: ? Your vomiting gets worse or does not go away. ? Your vomit includes blood or green matter (bile). ? You cannot eat or drink without vomiting.  Have problems with urination or bowel movements, such as: ? Diarrhea that gets worse or does not go away. ? Blood in your stool (feces). This may cause stool to look black and tarry. ? Not urinating, or urinating only a small amount of very dark urine, within 6-8 hours.  Have trouble breathing.  Have symptoms that get worse with treatment. These symptoms may represent a serious problem that is an emergency. Do not wait to see if the symptoms will go away. Get medical help right away. Call your local emergency services (911 in the [place] Do not drive yourself to the hospital. Summary  Rehydration is the replacement of body fluids and minerals (electrolytes) that are lost during dehydration.  Follow instructions from your health care provider for rehydration. The kind of fluid and amount you should drink depend on your condition.  Slowly increase how much you drink until you have taken the amount recommended by your health care provider.  Contact your health care provider if you continue to show signs of mild or moderate dehydration. This information is not intended to replace advice given to you by your health care provider. Make sure you discuss any questions you have with  your health care provider. Document Revised: 02/13/2020 Document Reviewed: 12/24/2019 Elsevier Patient Education  2021 Elsevier Inc.  

## 2021-05-04 NOTE — Telephone Encounter (Signed)
Improved kidney function on most recent BMP thus will continue Metformin. Remaining changes to medications as discussed in most recent progress note.

## 2021-05-04 NOTE — Assessment & Plan Note (Signed)
Chronic, elevated today in setting of not taking medications this morning. Refills are scheduled to arrive at house today. - continue Amlodipine and Hydralazine

## 2021-05-04 NOTE — Assessment & Plan Note (Signed)
Chronic. Improved. A1C improved from 8.0 to 6.5 today. Unclear if CLL could interfere with A1C level. Prior random CMP/BMP's with glucose level of 90-100. A1C goal <8 - Continue Metformin 500mg  QD as long as GFR >30 - Continue Jardiance 25mg  QD - Has not been taking Trulicity. Will hold off on restarted given A1C level and age.  - follow up 3 months to monitor. Recommend diabetic foot exam at that time (unable to be completed today due to other acute concerns).

## 2021-05-04 NOTE — Assessment & Plan Note (Signed)
Chronic, stable. Last GFR 33 in May 2022. Medications reviewed and adjusted.  Metformin 500mg  QD okay if GFR>30 Duloxetine decreased to 30mg . If continues to worsen,w ill need to taper off. Jardiance 10mg  okay as long as GFR >20 Hydralazine okay as long as GFR >10 Max Gabapentin 600mg /day (divided doses) Tramadol IR okay with 200mg /day max (avoid ER) No dose adjustments: trulicity, amlodipine, Atorvastatin, Plavix, Flexeril

## 2021-05-04 NOTE — Assessment & Plan Note (Signed)
Acute after fall on 04/27/21. Swelling more localized between MCP and PIP, very tender to palpation. Neurovascularly intact. Good ROM at MCP but pain with any ROM of DIP and PIP. Remaining hand normal. - hand x-ray

## 2021-05-04 NOTE — Patient Instructions (Signed)
It was a pleasure to see you today!  Thank you for choosing [facility] for your primary care.   Our plans for today were:  Diabetes: A1C is much improved at 1.6%  Stop Trulicity  Continue Metformin 500mg  daily and Jardiance 10mg  daily  Cholesterol: continue your Atorvastatin 80mg  daily  Blood pressure: Continue your Amlodipine 5mg  daily and Hydralazine 50mg  three times a day  For your falls, be careful taking Flexeril and tramadol as these can make you sleepy. Use with caution.   [facility] for the finger x-[facility]  I have referred you to physical therapy  Use heating pad to your shoulder - I think this is a tight muslc  Use ice on your finger and forehead due to swelling  To keep you healthy, please keep in mind the following health maintenance items that you are due for:   1. Diabetic foot exam 2.  Diabetic eye exam  3. TDAP 4. Mammogram  BRING ALL OF YOUR MEDICATIONS WITH YOU TO EVERY VISIT   You should return to our clinic in 3 months for check up.   Best Wishes,   Mina Marble, DO

## 2021-05-04 NOTE — Assessment & Plan Note (Signed)
Chronic. Continue Atorvastatin

## 2021-05-04 NOTE — Progress Notes (Signed)
I connected by phone with [name] [name] on 05/04/2021, 12:39 PM to discuss the potential use of a new treatment, tixagevimab/cilgavimab, for pre-exposure prophylaxis for prevention of coronavirus disease 2019 (COVID-19) caused by the SARS-CoV-2 virus.  This patient is a 75 y.o. female that meets the FDA criteria for Emergency Use Authorization of tixagevimab/cilgavimab for pre-exposure prophylaxis of COVID-19 disease. Pt meets following criteria:  Age >12 yr and weight > 40kg  Not currently infected with SARS-CoV-2 and has no known recent exposure to an individual infected with SARS-CoV-2 AND o Who has moderate to severe immune compromise due to a medical condition or receipt of immunosuppressive medications or treatments and may not mount an adequate immune response to COVID-19 vaccination or  o Vaccination with any available COVID-19 vaccine, according to the approved or authorized schedule, is not recommended due to a history of severe adverse reaction (e.g., severe allergic reaction) to a COVID-19 vaccine(s) and/or COVID-19 vaccine component(s).  o Patient meets the following definition of mod-severe immune compromised status: 6. Other actively treated hematologic malignancies or severe congenital immunodeficiency syndromes  I have spoken and communicated the following to the patient or parent/caregiver regarding COVID monoclonal antibody treatment:  1. FDA has authorized the emergency use of tixagevimab/cilgavimab for the pre-exposure prophylaxis of COVID-19 in patients with moderate-severe immunocompromised status, who meet above EUA criteria.  2. The significant known and potential risks and benefits of COVID monoclonal antibody, and the extent to which such potential risks and benefits are unknown.  3. Information on available alternative treatments and the risks and benefits of those alternatives, including clinical trials.  4. The patient or parent/caregiver has the option to accept or  refuse COVID monoclonal antibody treatment.  After reviewing this information with the patient, agree to receive tixagevimab/cilgavimab.  Orders placed.  Patient receiving IV fluids today, Evusheld can be administered at the beginning of this appointment.  Scot Dock, NP, 05/04/2021, 12:39 PM

## 2021-05-06 NOTE — Progress Notes (Signed)
HEMATOLOGY/ONCOLOGY CLINIC NOTE  Date of Service: 05/07/21    Patient Care Team: [name] Hefty, DO as PCP - General (Family Medicine) [name] Margarita, MD as PCP - Cardiology (Cardiology) [name] Hefty, DO (Family Medicine)  CHIEF COMPLAINTS/PURPOSE OF CONSULTATION:  F/u for CLL   HISTORY OF PRESENTING ILLNESS:  [name] [name] is a wonderful 75 y.o. female smoker with history of CLL who has been referred to [place] by Dr [name] [name], [name] Endo, DO at [facility] for evaluation and management of elevated WBC/lymphocytes.   She is accompanied by her husband. She reported to ED on 11/27/2017 with cough and abdominal pain with COPD exacerbation. Her labs on this day showed WBC elevated at 62.5. She has not previously seen a hematologist. She reports a plan to quit smoking on the first day of the new year. She reports worsening decrease in balance and states she has a cane and walker that she uses as needed. She was taking Prednisone.  No weight loss/fevers/chills/night sweats/ palpable lumps or bumps.  On review of systems, pt reports back pain, change in balance, abdominal pain, weight loss and denies changes in BM, fever and any other accompanying symptoms.   INTERVAL HISTORY:  [name] [name] is here for management and evaluation of her Chronic Leukocytic Leukemia. The patient's last visit with [place] was on 04/23/2021. The pt reports that she is doing well overall.  The pt reports that she started the 100 mg Venetoclax on Sunday. She has been doing well with no new symptoms or concerns. The pt is tolerating the treatment well at this time. The pt notes she had a fall in her bedroom and hit her head, but is unsure how it happened or what she bumped her head against. The pt notes she was attempting to call her mother who passed away years ago. The pt notes she drinks plenty water and has been eating well. The pt has since lowered her bed closer to the ground. The pt received the  [place].  Lab results today 05/07/2021 of CBC w/diff and CMP is as follows: all values are WNL except for Hgb of 10.9, HCT of 35.4, RDW of 22.0, Lymphs Abs of 4.8K, Monocytes Abs of 1.1K, Glucose of 110, Creatinine of 1.65, Calcium of 8.6, Total protein of 6.2, AST of 14, Alkaline Phosphatase of 207, GFR est of 33. 05/07/2021 Uric Acid of 3.4.  On review of systems, pt reports recent fall, ankle pain due to fall and denies dizziness, lightheadedness, fatigue, infection issues, and any other symptoms.  MEDICAL HISTORY:  Past Medical History:  Diagnosis Date  . Allergy    environmental  . Asthma   . Brain tumor ([place])   . CKD (chronic kidney disease)   . CLL (chronic lymphoblastic leukemia)   . CLL (chronic lymphocytic leukemia) ([place])   . Constipation 11/15/2011  . COPD (chronic obstructive pulmonary disease) ([place])   . CVA (cerebral vascular accident) ([place])   . Diabetes ([place])   . Diabetes mellitus without complication ([place])   . GERD (gastroesophageal reflux disease)   . Headache    migraines  . Heart murmur   . Hyperlipidemia   . Hypertension   . Leukemia ([place])   . PVD (peripheral vascular disease) ([place])   . Stroke [facility]) 1998   in 1998 due to tumor-right side    SURGICAL HISTORY: Past Surgical History:  Procedure Laterality Date  . ABDOMINAL AORTOGRAM W/LOWER EXTREMITY Bilateral 08/12/2020   Procedure: ABDOMINAL  AORTOGRAM W/BILATERAL LOWER EXTREMITY RUNOFF;  Surgeon: Serafina Mitchell, MD;  Location: [facility];  Service: Cardiovascular;  Laterality: Bilateral;  . ABI  02/2012   ABI <0.65 BL 02/2012  . BRAIN MENINGIOMA EXCISION    . BRAIN TUMOR EXCISION    . Bypass grafting of RLE for PAD claudication     . CARDIAC CATHETERIZATION  04/09/208  . CESAREAN SECTION     1974, 78 ,80  . CHOLECYSTECTOMY, LAPAROSCOPIC    . COLONOSCOPY  2014   [place], [place]  . ENDARTERECTOMY FEMORAL Right 10/03/2020   Procedure: RIGHT FEMORAL ENDARTERECTOMY WITH VEIN PATCH ANGIOPLASTY;   Surgeon: Serafina Mitchell, MD;  Location: [facility];  Service: Vascular;  Laterality: Right;  . ESOPHAGEAL DILATION    . FEMORAL-POPLITEAL BYPASS GRAFT Right 10/03/2020   Procedure: RIGHT FEMORAL- BELOW KNEE POPLITEAL BYPASS USING GORE PROPATEN 6MM GRAFT;  Surgeon: Serafina Mitchell, MD;  Location: [facility];  Service: Vascular;  Laterality: Right;  . PATCH ANGIOPLASTY  10/03/2020   Procedure: VEIN PATCH ANGIOPLASTY;  Surgeon: Serafina Mitchell, MD;  Location: [facility];  Service: Vascular;;    SOCIAL HISTORY: Social History   Socioeconomic History  . Marital status: Single    Spouse name: Not on file  . Number of children: 3  . Years of education: Not on file  . Highest education level: Not on file  Occupational History  . Occupation: DISABLED    Employer: DISABLED  Tobacco Use  . Smoking status: Current Every Day Smoker    Packs/day: 0.25    Years: 30.00    Pack years: 7.50    Types: Cigarettes    Start date: 01/01/1976  . Smokeless tobacco: Never Used  . Tobacco comment: ~ 3 cigarettes / day  Vaping Use  . Vaping Use: Never used  Substance and Sexual Activity  . Alcohol use: Not Currently  . Drug use: Not Currently  . Sexual activity: Yes  Other Topics Concern  . Not on file  Social History Narrative   ** Merged History Encounter **       Health Care POA:  Emergency Contact: [name] [name] [phone] ([phone]) End of Life Plan:  Who lives with you: Lives with husband Any pets: none Diet: Patient lacks financial resources for much food. Pt reports eating what is available. Exercise: Patient    does not have an exercise plan. Seatbelts: Patient reports wearing seatbelt when in vehicle.  [name] Exposure/Protection: Hobbies: Bowling, computer games, Bingo  Has financial difficulties and transportation issues as she and her husband share transpo   rtation      Social Determinants of Radio broadcast assistant Strain: Not on file  Food Insecurity: Not on file  Transportation Needs: Not  on file  Physical Activity: Not on file  Stress: Not on file  Social Connections: Not on file  Intimate Partner Violence: Not on file    FAMILY HISTORY: Family History  Problem Relation Age of Onset  . Heart disease Mother   . Asthma Mother   . Cancer Mother        uterine   . Depression Mother   . Heart attack Mother 45  . Hyperlipidemia Mother   . Hypertension Mother   . Stroke Mother   . Kidney disease Mother   . Heart attack Sister   . Stroke Sister   . Depression Sister   . Diabetes Sister   . Hyperlipidemia Sister   . Depression Sister   .  Diabetes Sister   . Hyperlipidemia Sister   . HIV/AIDS Sister   . Diabetes Brother   . Asthma Brother   . Hypertension Brother   . Cancer Maternal Aunt        lung  . Heart disease Maternal Grandmother   . Heart attack Maternal Grandmother   . Cancer Brother        colon  . HIV/AIDS Brother   . Colon cancer Neg Hx     ALLERGIES:  is allergic to gazyva [obinutuzumab], aspirin, no healthtouch food allergies, aspirin, and baclofen.  MEDICATIONS:  Current Outpatient Medications  Medication Sig Dispense Refill  . acetaminophen (TYLENOL) 500 MG tablet Take 500-1,000 mg by mouth every 6 (six) hours as needed for moderate pain.     Marland Kitchen albuterol (ACCUNEB) 0.63 MG/3ML nebulizer solution Inhale 1 vial via nebulizer every 6 hours for wheezing as needed 75 mL 11  . albuterol (VENTOLIN HFA) 108 (90 Base) MCG/ACT inhaler Inhale 2 puffs by mouth every 6 hours as needed for wheezing or shortness of breath (Patient taking differently: Inhale 2 puffs into the lungs every 6 (six) hours as needed for wheezing or shortness of breath.) 8.5 g 11  . Alcohol Swabs (ALCOHOL PADS) 70 % PADS SMARTSIG:Pledget(s) Topical Daily    . allopurinol (ZYLOPRIM) 300 MG tablet Take 150 mg by mouth 2 (two) times daily.    Marland Kitchen amLODipine (NORVASC) 5 MG tablet Take 5 mg by mouth daily.    Marland Kitchen atorvastatin (LIPITOR) 80 MG tablet Take 80 mg by mouth daily.    Marland Kitchen b complex  vitamins capsule Take 1 capsule by mouth daily. 30 capsule 5  . Blood Glucose Calibration (OT ULTRA/FASTTK CNTRL SOLN) SOLN See admin instructions.    . Blood Glucose Monitoring Suppl (ONETOUCH VERIO) w/Device KIT 1 Device by Does not apply route daily. 1 kit 0  . BREO ELLIPTA 100-25 MCG/INH AEPB Inhale 1 puff into the lungs daily.    . clopidogrel (PLAVIX) 75 MG tablet Take 75 mg by mouth daily.    . cyclobenzaprine (FLEXERIL) 10 MG tablet Take 1 tablet (10 mg total) by mouth 3 (three) times daily as needed for muscle spasms. 90 tablet 2  . diclofenac Sodium (VOLTAREN) 1 % GEL Apply 2 grams topically four times a day 100 g 11  . DULoxetine (CYMBALTA) 30 MG capsule Take 1 capsule (30 mg total) by mouth daily. 90 capsule 1  . famotidine (PEPCID) 20 MG tablet Take 20 mg by mouth 2 (two) times daily.    . furosemide (LASIX) 20 MG tablet Take 1 tablet (20 mg total) by mouth daily. 30 tablet 3  . gabapentin (NEURONTIN) 300 MG capsule Take 600 mg by mouth 2 (two) times daily.    Marland Kitchen glucose blood (ONETOUCH VERIO) test strip Use to check blood sugar once a day 100 each 12  . hydrALAZINE (APRESOLINE) 50 MG tablet Take 1 tablet (50 mg total) by mouth 3 (three) times daily. 90 tablet 2  . iron polysaccharides (NIFEREX) 150 MG capsule Take 1 capsule (150 mg total) by mouth daily. 30 capsule 5  . Lancet Devices (EASY MINI EJECT LANCING DEVICE) MISC daily.    . Lancets (ONETOUCH ULTRASOFT) lancets Use to check blood sugar once a day 100 each 12  . lidocaine (XYLOCAINE) 5 % ointment Apply 1 application topically as needed. Apply to R>L feet when burning up to 4x/day as needed 50 g 3  . metFORMIN (GLUCOPHAGE) 500 MG tablet Take 1 tablet (500 mg  total) by mouth daily. 90 tablet 0  . montelukast (SINGULAIR) 10 MG tablet Take 1 tablet (10 mg total) by mouth at bedtime. 30 tablet 11  . nitroGLYCERIN (NITROSTAT) 0.4 MG SL tablet Dissolve 1 tablet under tongue every 5 minutes, up to 3 doses for chest pain 25 tablet 11   . traMADol (ULTRAM) 50 MG tablet Take 1 tablet (50 mg total) by mouth 2 (two) times daily as needed. 30 tablet 0  . VENCLEXTA STARTING PACK 10 & 50 & 100 MG Starter Pack Take 10-100 mg by mouth See admin instructions. As directed on starter pack    . venetoclax (VENCLEXTA) 10 & 50 & 100 MG Starter Pack Take by mouth daily. Take 20 mg for 7 days, then 50 mg daily x 7d, then 100 mg daily x 7d, then 200 mg daily x 7d. Take with food & water. 42 each 0   No current facility-administered medications for this visit.    REVIEW OF SYSTEMS:   10 Point review of Systems was done is negative except as noted above.  PHYSICAL EXAMINATION: ECOG FS:2 - Symptomatic, <50% confined to bed  There were no vitals filed for this visit. Wt Readings from Last 3 Encounters:  05/04/21 136 lb 12.8 oz (62.1 kg)  04/27/21 145 lb 8.1 oz (66 kg)  04/23/21 140 lb (63.5 kg)   There is no height or weight on file to calculate BMI.    Exam was given in a wheelchair.   GENERAL:alert, in no acute distress and comfortable SKIN: no acute rashes, no significant lesions EYES: conjunctiva are pink and non-injected, sclera anicteric OROPHARYNX: MMM, no exudates, no oropharyngeal erythema or ulceration NECK: supple, no JVD LYMPH:  no palpable lymphadenopathy in the cervical, axillary or inguinal regions LUNGS: clear to auscultation b/l with normal respiratory effort HEART: regular rate & rhythm ABDOMEN:  normoactive bowel sounds , non tender, not distended. Extremity: trace pedal edema PSYCH: alert & oriented x 3 with fluent speech NEURO: no focal motor/sensory deficits  LABORATORY DATA:  I have reviewed the data as listed   CBC Latest Ref Rng & Units 05/07/2021 05/04/2021 04/30/2021  WBC 4.0 - 10.5 K/uL 8.7 9.8 15.4(H)  Hemoglobin 12.0 - 15.0 g/dL 10.9(L) 10.5(L) 10.4(L)  Hematocrit 36.0 - 46.0 % 35.4(L) 34.1(L) 33.3(L)  Platelets 150 - 400 K/uL 219 254 261    . CMP Latest Ref Rng & Units 05/07/2021 05/04/2021  04/30/2021  Glucose 70 - 99 mg/dL 110(H) 140(H) 96  BUN 8 - 23 mg/dL '11 12 15  ' Creatinine 0.44 - 1.00 mg/dL 1.65(H) 1.48(H) 1.64(H)  Sodium 135 - 145 mmol/L 141 142 142  Potassium 3.5 - 5.1 mmol/L 3.8 3.5 3.7  Chloride 98 - 111 mmol/L 104 105 104  CO2 22 - 32 mmol/L '28 26 28  ' Calcium 8.9 - 10.3 mg/dL 8.6(L) 9.1 9.3  Total Protein 6.5 - 8.1 g/dL 6.2(L) 6.3(L) 6.3(L)  Total Bilirubin 0.3 - 1.2 mg/dL 0.4 0.5 0.4  Alkaline Phos 38 - 126 U/L 207(H) 227(H) 245(H)  AST 15 - 41 U/L 14(L) 15 14(L)  ALT 0 - 44 U/L 7 7 <6   .. Lab Results  Component Value Date   IRON 37 (L) 02/06/2021   TIBC 424 02/06/2021   IRONPCTSAT 9 (L) 02/06/2021   (Iron and TIBC)  Lab Results  Component Value Date   FERRITIN 15 02/06/2021    Component     Latest Ref Rng & Units 02/06/2021  Vitamin B12  180 - 914 pg/mL 448  Haptoglobin     42 - 346 mg/dL 223   Lab Results  Component Value Date   LDH 445 (H) 02/06/2021        . Lab Results  Component Value Date   LDH 445 (H) 02/06/2021    RADIOGRAPHIC STUDIES: I have personally reviewed the radiological images as listed and agreed with the findings in the report. DG ELBOW COMPLETE LEFT (3+VIEW)  Result Date: 04/27/2021 CLINICAL DATA:  75 year old female status post fall. On blood thinners. EXAM: LEFT ELBOW - COMPLETE 3+ VIEW COMPARISON:  Left forearm series today. FINDINGS: Bone mineralization is within normal limits for age. No evidence of joint effusion. Left radial head appears intact. Distal humerus and proximal ulna appear intact. No acute osseous abnormality identified. Several punctate calcific foci again noted in the forearm. IMPRESSION: No acute fracture or dislocation identified about the left elbow. Electronically Signed   By: Genevie Ann M.D.   On: 04/27/2021 04:45   DG ELBOW COMPLETE RIGHT (3+VIEW)  Result Date: 04/27/2021 CLINICAL DATA:  75 year old female status post fall. On blood thinners. EXAM: RIGHT ELBOW - COMPLETE 3+ VIEW  COMPARISON:  None. FINDINGS: No evidence of joint effusion. Bone mineralization is within normal limits for age. Radial head appears intact. Distal humerus appears intact with small chronic ossific fragment at the lateral epicondyle. No acute osseous abnormality identified. No discrete soft tissue injury. IMPRESSION: No acute fracture or dislocation identified about the right elbow. Electronically Signed   By: Genevie Ann M.D.   On: 04/27/2021 04:44   DG Forearm Left  Result Date: 04/27/2021 CLINICAL DATA:  75 year old female status post fall. On blood thinners. EXAM: LEFT FOREARM - 2 VIEW COMPARISON:  Left wrist series today reported separately. FINDINGS: Stable alignment at the left wrist. Bone mineralization is within normal limits. The left radius and ulna appear intact. Alignment at the left elbow appears maintained. There are 1 or 2 punctate calcific foci in the forearm soft tissues. But otherwise no discrete soft tissue injury. IMPRESSION: No acute fracture or dislocation identified about the left forearm. Electronically Signed   By: Genevie Ann M.D.   On: 04/27/2021 04:43   DG Wrist Complete Left  Result Date: 04/27/2021 CLINICAL DATA:  75 year old female status post fall. On blood thinners. EXAM: LEFT WRIST - COMPLETE 3+ VIEW COMPARISON:  None. FINDINGS: Bone mineralization is within normal limits for age. The lateral view is oblique. Distal radius and ulna appear intact. Carpal bone alignment and joint spaces are within normal limits for age. No carpal or metacarpal fracture identified. Incidental IV access artifact. No discrete soft tissue injury. IMPRESSION: No acute fracture or dislocation identified about the left wrist. Electronically Signed   By: Genevie Ann M.D.   On: 04/27/2021 04:42   DG Wrist Complete Right  Result Date: 04/27/2021 CLINICAL DATA:  75 year old female status post fall. On blood thinners. EXAM: RIGHT WRIST - COMPLETE 3+ VIEW COMPARISON:  None. FINDINGS: Bone mineralization is within  normal limits for age. Distal radius and ulna are intact. Carpal bone alignment and joint spaces are within normal limits for age. No acute metacarpal fracture. Possible healed 5th metacarpal fracture. No acute osseous abnormality identified. Mild soft tissue swelling. IMPRESSION: No acute fracture or dislocation identified about the right wrist. Electronically Signed   By: Genevie Ann M.D.   On: 04/27/2021 04:41   CT Head Wo Contrast  Result Date: 04/27/2021 CLINICAL DATA:  75 year old female status post fall. On blood  thinners. History of prior meningioma resection. EXAM: CT HEAD WITHOUT CONTRAST TECHNIQUE: Contiguous axial images were obtained from the base of the skull through the vertex without intravenous contrast. COMPARISON:  Brain MRI 02/23/2016.  Head CT 06/24/2015. Face and cervical spine CT today reported separately. FINDINGS: Brain: Chronic encephalomalacia at the left superior perirolandic cortex underlying chronic craniotomy defect. Elsewhere cerebral volume remains normal for age. No midline shift, ventriculomegaly, mass effect, evidence of mass lesion, intracranial hemorrhage or evidence of cortically based acute infarction. Stable gray-white matter differentiation throughout the brain. Vascular: Calcified atherosclerosis at the skull base. No suspicious intracranial vascular hyperdensity. Skull: Chronic left posterior vertex craniotomy. No acute osseous abnormality identified. Sinuses/Orbits: Visualized paranasal sinuses and mastoids are stable and well aerated. Other: Large, broad-based left scalp hematoma tracks over the left orbit in the preseptal space, and toward the vertex. No scalp soft tissue gas. Left globe and intraorbital soft tissues appear to remain normal. See also Face CT today reported separately. IMPRESSION: 1. Large left scalp hematoma tracking over the left orbit in the preseptal space. No underlying skull fracture identified. See also Face CT reported separately. 2. No acute  intracranial abnormality. Stable left superior hemisphere encephalomalacia from remote meningioma resection. Electronically Signed   By: Genevie Ann M.D.   On: 04/27/2021 04:51   CT Cervical Spine Wo Contrast  Result Date: 04/27/2021 CLINICAL DATA:  75 year old female status post fall. On blood thinners. EXAM: CT CERVICAL SPINE WITHOUT CONTRAST TECHNIQUE: Multidetector CT imaging of the cervical spine was performed without intravenous contrast. Multiplanar CT image reconstructions were also generated. COMPARISON:  Head and face CT today reported separately. Report of cervical spine MRI 02/22/2000 (no images available). FINDINGS: Alignment: Straightening of cervical lordosis. Cervicothoracic junction alignment is within normal limits. Bilateral posterior element alignment is within normal limits. Skull base and vertebrae: Visualized skull base is intact. No atlanto-occipital dissociation. C1 and C2 appear intact and aligned. No acute osseous abnormality identified. Soft tissues and spinal canal: No prevertebral fluid or swelling. No visible canal hematoma. Mild motion artifact at the pharynx. Calcified carotid atherosclerosis but otherwise negative visible neck soft tissues. Disc levels: Dominant cervical spine degenerative finding is facet arthropathy, with evidence of developing degenerative facet ankylosis at C2-C3 on the left, bilaterally at C6-C7. Disc and endplate degeneration is maximal at C4-C5. Mild if any associated spinal stenosis. Upper chest: Visible upper thoracic levels appear intact. Centrilobular emphysema in the lung apices. Calcified aortic atherosclerosis. Other: Small sebaceous cyst anterior to the left sternoclavicular joint, inconsequential. IMPRESSION: 1.  No acute traumatic injury identified in the cervical spine. 2. Facet degeneration with evidence of developing degenerative facet ankylosis at several levels. 3. Aortic Atherosclerosis (ICD10-I70.0) and Emphysema (ICD10-J43.9). Electronically  Signed   By: Genevie Ann M.D.   On: 04/27/2021 04:59   CT Maxillofacial Wo Contrast  Result Date: 04/27/2021 CLINICAL DATA:  75 year old female status post fall. On blood thinners. History of prior meningioma resection. EXAM: CT MAXILLOFACIAL WITHOUT CONTRAST TECHNIQUE: Multidetector CT imaging of the maxillofacial structures was performed. Multiplanar CT image reconstructions were also generated. COMPARISON:  Head CT today reported separately,  06/04/2015. FINDINGS: Osseous: Carious and absent bilateral dentition. Mandible intact and normally located. No maxilla fracture. No zygoma fracture. Pterygoid plates intact. No nasal bone fracture. Central skull base and visible calvarium appear intact. Cervical spine detailed separately. Orbits: No orbital wall fracture. Globes appear symmetric and intact. No intraorbital gas or hematoma. Left preseptal space hematoma contiguous with that of the forehead. Sinuses: Mild chronic  paranasal sinus mucosal thickening has not significantly changed since 2016. Tympanic cavities and mastoids are clear. Soft tissues: Retained secretions in the nasopharynx. Calcified carotid atherosclerosis in the neck. Otherwise negative visible noncontrast deep soft tissue spaces of the face. Limited intracranial: Stable to that reported separately. IMPRESSION: 1. Relatively large superficial left forehead and preseptal/periorbital hematoma. No underlying or acute facial fracture identified. 2. Poor dentition. Electronically Signed   By: Genevie Ann M.D.   On: 04/27/2021 04:55   Surgical Pathology 12/13/2017   ASSESSMENT & PLAN:   [name] [name] is a wonderful 75 y.o. female with    1. Rai 1 Chronic Lymphocytic Leukemia  - Trisomy 12 mutation. No associated thrombocytopenia.  Mild anemia - likely from acute blood loss from significant epistaxis. No constitutional symptoms. Minimal LNaednopathy No splenomegaly  #2 .Lung nodules on CT in 12/2017. Rpt CT chest 08/09/2018 -  stable  #3 Patient Active Problem List   Diagnosis Date Noted  . Fall 05/04/2021  . Finger pain, right 05/04/2021  . Counseling regarding advance care planning and goals of care 02/19/2021  . Iron deficiency anemia 02/12/2021  . Peripheral arterial disease ([place]) 10/03/2020  . (HFpEF) heart failure with preserved ejection fraction ([place]) 06/11/2020  . Venous insufficiency (chronic) (peripheral) 06/22/2019  . Pulmonary nodules 01/04/2018  . Mass of left lower leg 01/04/2018  . Chronic kidney disease (CKD), stage III (moderate) ([place]) 06/04/2016  . Chronic back pain 10/02/2015  . DM (diabetes mellitus), type 2 with peripheral vascular complications ([place]) 68/91/5525  . Chronic lymphocytic leukemia ([place]) 04/11/2009  . PERIPHERAL VASCULAR DISEASE 10/13/2007  . HYPERCHOLESTEROLEMIA 02/23/2007  . ANXIETY 02/23/2007  . TOBACCO DEPENDENCE 02/23/2007  . Essential hypertension 02/23/2007  . COPD (chronic obstructive pulmonary disease) ([place]) 02/23/2007  . GASTROESOPHAGEAL REFLUX, NO ESOPHAGITIS 02/23/2007    PLAN: -Discussed pt labwork today, 05/07/2021; blood counts and chemistries improved. Uric acid normal. -Continue with 100 mg Venetoclax at this time. Pt will be increasing to 200 mg this "Sunday. Advised pt we will not increase once she hits the 200 mg. Would continue this for one year. -Continue to wear compression socks and elevate legs to alleviate leg swelling.  -Recommended pt drink 48-64 oz water daily. -Continue PO Iron and B-complex daily.  -Will see back in 2 weeks with labs.    FOLLOW UP: RTC with Dr Anaka Beazer with labs in 2 weeks    The total time spent in the appointment was 20 minutes and more than 50% was on counseling and direct patient cares.    All of the patient's questions were answered with apparent satisfaction. The patient knows to call the clinic with any problems, questions or concerns.    Kimberly Nieland MD MS AAHIVMS SCH CTH Hematology/Oncology Physician [facility]  (Office):       [phone] (Work cell):  [phone] (Fax):           [phone]  I, Reinaldo Raddle, am acting as scribe for Dr. Sullivan Lone, MD.  .I have reviewed the above documentation for accuracy and completeness, and I agree with the above. Brunetta Genera MD

## 2021-05-07 ENCOUNTER — Other Ambulatory Visit (HOSPITAL_COMMUNITY): Payer: Self-pay

## 2021-05-07 ENCOUNTER — Inpatient Hospital Stay: Payer: Medicare Other

## 2021-05-07 ENCOUNTER — Ambulatory Visit: Payer: Medicare Other

## 2021-05-07 ENCOUNTER — Other Ambulatory Visit: Payer: Self-pay

## 2021-05-07 ENCOUNTER — Inpatient Hospital Stay (HOSPITAL_BASED_OUTPATIENT_CLINIC_OR_DEPARTMENT_OTHER): Payer: Medicare Other | Admitting: Hematology

## 2021-05-07 DIAGNOSIS — C9110 Chronic lymphocytic leukemia of B-cell type not having achieved remission: Secondary | ICD-10-CM | POA: Diagnosis not present

## 2021-05-07 LAB — CBC WITH DIFFERENTIAL/PLATELET
Abs Immature Granulocytes: 0.02 10*3/uL (ref 0.00–0.07)
Basophils Absolute: 0 10*3/uL (ref 0.0–0.1)
Basophils Relative: 0 %
Eosinophils Absolute: 0.1 10*3/uL (ref 0.0–0.5)
Eosinophils Relative: 1 %
HCT: 35.4 % — ABNORMAL LOW (ref 36.0–46.0)
Hemoglobin: 10.9 g/dL — ABNORMAL LOW (ref 12.0–15.0)
Immature Granulocytes: 0 %
Lymphocytes Relative: 56 %
Lymphs Abs: 4.8 10*3/uL — ABNORMAL HIGH (ref 0.7–4.0)
MCH: 27.9 pg (ref 26.0–34.0)
MCHC: 30.8 g/dL (ref 30.0–36.0)
MCV: 90.5 fL (ref 80.0–100.0)
Monocytes Absolute: 1.1 10*3/uL — ABNORMAL HIGH (ref 0.1–1.0)
Monocytes Relative: 13 %
Neutro Abs: 2.6 10*3/uL (ref 1.7–7.7)
Neutrophils Relative %: 30 %
Platelets: 219 10*3/uL (ref 150–400)
RBC: 3.91 MIL/uL (ref 3.87–5.11)
RDW: 22 % — ABNORMAL HIGH (ref 11.5–15.5)
WBC: 8.7 10*3/uL (ref 4.0–10.5)
nRBC: 0 % (ref 0.0–0.2)

## 2021-05-07 LAB — CMP (CANCER CENTER ONLY)
ALT: 7 U/L (ref 0–44)
AST: 14 U/L — ABNORMAL LOW (ref 15–41)
Albumin: 3.9 g/dL (ref 3.5–5.0)
Alkaline Phosphatase: 207 U/L — ABNORMAL HIGH (ref 38–126)
Anion gap: 9 (ref 5–15)
BUN: 11 mg/dL (ref 8–23)
CO2: 28 mmol/L (ref 22–32)
Calcium: 8.6 mg/dL — ABNORMAL LOW (ref 8.9–10.3)
Chloride: 104 mmol/L (ref 98–111)
Creatinine: 1.65 mg/dL — ABNORMAL HIGH (ref 0.44–1.00)
GFR, Estimated: 33 mL/min — ABNORMAL LOW (ref >60)
Glucose, Bld: 110 mg/dL — ABNORMAL HIGH (ref 70–99)
Potassium: 3.8 mmol/L (ref 3.5–5.1)
Sodium: 141 mmol/L (ref 135–145)
Total Bilirubin: 0.4 mg/dL (ref 0.3–1.2)
Total Protein: 6.2 g/dL — ABNORMAL LOW (ref 6.5–8.1)

## 2021-05-07 LAB — URIC ACID: Uric Acid, Serum: 3.8 mg/dL (ref 2.5–7.1)

## 2021-05-07 MED ORDER — VENETOCLAX 100 MG PO TABS
200.0000 mg | ORAL_TABLET | Freq: Every day | ORAL | 2 refills | Status: DC
  Filled 2021-05-07 – 2021-05-11 (×2): qty 60, 30d supply, fill #0
  Filled 2021-06-11: qty 60, 30d supply, fill #1
  Filled 2021-07-06: qty 60, 30d supply, fill #2

## 2021-05-07 NOTE — Progress Notes (Signed)
No IVFs to be given today per Dr. Irene Limbo.

## 2021-05-07 NOTE — Addendum Note (Signed)
Addended by: Danna Hefty on: 05/07/2021 01:14 PM   Modules accepted: Orders

## 2021-05-08 ENCOUNTER — Other Ambulatory Visit: Payer: Medicare Other

## 2021-05-08 ENCOUNTER — Telehealth: Payer: Self-pay | Admitting: Hematology

## 2021-05-08 ENCOUNTER — Ambulatory Visit: Payer: Medicare Other

## 2021-05-08 NOTE — Telephone Encounter (Signed)
Scheduled follow-up appointment per 5/12 los. Patient is aware. 

## 2021-05-11 ENCOUNTER — Other Ambulatory Visit (HOSPITAL_COMMUNITY): Payer: Self-pay

## 2021-05-13 ENCOUNTER — Other Ambulatory Visit (HOSPITAL_COMMUNITY): Payer: Self-pay

## 2021-05-15 ENCOUNTER — Ambulatory Visit: Payer: Medicare Other

## 2021-05-15 ENCOUNTER — Other Ambulatory Visit: Payer: Medicare Other

## 2021-05-15 NOTE — Progress Notes (Signed)
HEMATOLOGY/ONCOLOGY CLINIC NOTE  Date of Service: 05/18/21    Patient Care Team: Danna Hefty, DO as PCP - General (Family Medicine) Sueanne Margarita, MD as PCP - Cardiology (Cardiology) Danna Hefty, DO (Family Medicine)  CHIEF COMPLAINTS/PURPOSE OF CONSULTATION:  F/u for CLL   HISTORY OF PRESENTING ILLNESS:  [name] [name] is a wonderful 75 y.o. female smoker with history of CLL who has been referred to [place] by Dr Tarry Kos, Archie Endo, DO at [facility] for evaluation and management of elevated WBC/lymphocytes.   She is accompanied by her husband. She reported to ED on 11/27/2017 with cough and abdominal pain with COPD exacerbation. Her labs on this day showed WBC elevated at 62.5. She has not previously seen a hematologist. She reports a plan to quit smoking on the first day of the new year. She reports worsening decrease in balance and states she has a cane and walker that she uses as needed. She was taking Prednisone.  No weight loss/fevers/chills/night sweats/ palpable lumps or bumps.  On review of systems, pt reports back pain, change in balance, abdominal pain, weight loss and denies changes in BM, fever and any other accompanying symptoms.   INTERVAL HISTORY:  [name] [name] is here for management and evaluation of her Chronic Leukocytic Leukemia. The patient's last visit with [place] was on 05/07/2021. The pt reports that she is doing well overall. We are joined today by her husband.  The pt reports that she recently had another fall. This time she was in her laundry room and tripped over something and fell on her head. The pt has not been evaluated for this fall. The pt notes much fatigue today. This fall was two days ago and the pt notes that she hit her forehead first. She notes her head is still hurting, but denies losing consciousness or being lightheaded or dizzy. The pt has been sleepier since the fall and her left eye is swollen. The pt uses a cane at  home, but was not at the time of the fall.  Lab results today 05/18/2021 of CBC w/diff and CMP is as follows: all values are WNL except for RBC of 3.70, Hgb of 10.3, HCT of 33.5, RDW of 20.1, Monocytes Abs of 1.4K, Glucose of 122, Creatinine of 1.58, Total Protein of 6.0, Alkaline Phosphatase of 185, Total Bilirubin of 0.2, GFR est of 34. 05/18/2021 Uric Acid of 3.5.  On review of systems, pt reports recent recurrent falls, imbalance, head injury, swollen eye, continued leg swelling and denies abdominal pain and any other symptoms.  MEDICAL HISTORY:  Past Medical History:  Diagnosis Date  . Allergy    environmental  . Asthma   . Brain tumor ([place])   . CKD (chronic kidney disease)   . CLL (chronic lymphoblastic leukemia)   . CLL (chronic lymphocytic leukemia) ([place])   . Constipation 11/15/2011  . COPD (chronic obstructive pulmonary disease) ([place])   . CVA (cerebral vascular accident) ([place])   . Diabetes ([place])   . Diabetes mellitus without complication ([place])   . GERD (gastroesophageal reflux disease)   . Headache    migraines  . Heart murmur   . Hyperlipidemia   . Hypertension   . Leukemia ([place])   . PVD (peripheral vascular disease) ([place])   . Stroke [facility]) 1998   in 1998 due to tumor-right side    SURGICAL HISTORY: Past Surgical History:  Procedure Laterality Date  . ABDOMINAL AORTOGRAM W/LOWER EXTREMITY Bilateral  08/12/2020   Procedure: ABDOMINAL AORTOGRAM W/BILATERAL LOWER EXTREMITY RUNOFF;  Surgeon: Serafina Mitchell, MD;  Location: [place] CV LAB;  Service: Cardiovascular;  Laterality: Bilateral;  . ABI  02/2012   ABI <0.65 BL 02/2012  . BRAIN MENINGIOMA EXCISION    . BRAIN TUMOR EXCISION    . Bypass grafting of RLE for PAD claudication     . CARDIAC CATHETERIZATION  04/09/208  . CESAREAN SECTION     1974, 78 ,80  . CHOLECYSTECTOMY, LAPAROSCOPIC    . COLONOSCOPY  2014   [place], [place]  . ENDARTERECTOMY FEMORAL Right 10/03/2020   Procedure: RIGHT FEMORAL ENDARTERECTOMY  WITH VEIN PATCH ANGIOPLASTY;  Surgeon: Serafina Mitchell, MD;  Location: [facility] OR;  Service: Vascular;  Laterality: Right;  . ESOPHAGEAL DILATION    . FEMORAL-POPLITEAL BYPASS GRAFT Right 10/03/2020   Procedure: RIGHT FEMORAL- BELOW KNEE POPLITEAL BYPASS USING GORE PROPATEN 6MM GRAFT;  Surgeon: Serafina Mitchell, MD;  Location: [place];  Service: Vascular;  Laterality: Right;  . PATCH ANGIOPLASTY  10/03/2020   Procedure: VEIN PATCH ANGIOPLASTY;  Surgeon: Serafina Mitchell, MD;  Location: [facility] OR;  Service: Vascular;;    SOCIAL HISTORY: Social History   Socioeconomic History  . Marital status: Single    Spouse name: Not on file  . Number of children: 3  . Years of education: Not on file  . Highest education level: Not on file  Occupational History  . Occupation: DISABLED    Employer: DISABLED  Tobacco Use  . Smoking status: Current Every Day Smoker    Packs/day: 0.25    Years: 30.00    Pack years: 7.50    Types: Cigarettes    Start date: 01/01/1976  . Smokeless tobacco: Never Used  . Tobacco comment: ~ 3 cigarettes / day  Vaping Use  . Vaping Use: Never used  Substance and Sexual Activity  . Alcohol use: Not Currently  . Drug use: Not Currently  . Sexual activity: Yes  Other Topics Concern  . Not on file  Social History Narrative   ** Merged History Encounter **       Health Care POA:  Emergency Contact: Keila Turan [phone] (c) End of Life Plan:  Who lives with you: Lives with husband Any pets: none Diet: Patient lacks financial resources for much food. Pt reports eating what is available. Exercise: Patient    does not have an exercise plan. Seatbelts: Patient reports wearing seatbelt when in vehicle.  [name] Exposure/Protection: Hobbies: Bowling, computer games, Bingo  Has financial difficulties and transportation issues as she and her husband share transpo   rtation      Social Determinants of Radio broadcast assistant Strain: Not on file  Food Insecurity: Not on  file  Transportation Needs: Not on file  Physical Activity: Not on file  Stress: Not on file  Social Connections: Not on file  Intimate Partner Violence: Not on file    FAMILY HISTORY: Family History  Problem Relation Age of Onset  . Heart disease Mother   . Asthma Mother   . Cancer Mother        uterine   . Depression Mother   . Heart attack Mother 62  . Hyperlipidemia Mother   . Hypertension Mother   . Stroke Mother   . Kidney disease Mother   . Heart attack Sister   . Stroke Sister   . Depression Sister   . Diabetes Sister   . Hyperlipidemia Sister   .  Depression Sister   . Diabetes Sister   . Hyperlipidemia Sister   . HIV/AIDS Sister   . Diabetes Brother   . Asthma Brother   . Hypertension Brother   . Cancer Maternal Aunt        lung  . Heart disease Maternal Grandmother   . Heart attack Maternal Grandmother   . Cancer Brother        colon  . HIV/AIDS Brother   . Colon cancer Neg Hx     ALLERGIES:  is allergic to gazyva [obinutuzumab], aspirin, no healthtouch food allergies, aspirin, and baclofen.  MEDICATIONS:  Current Outpatient Medications  Medication Sig Dispense Refill  . acetaminophen (TYLENOL) 500 MG tablet Take 500-1,000 mg by mouth every 6 (six) hours as needed for moderate pain.     Marland Kitchen albuterol (ACCUNEB) 0.63 MG/3ML nebulizer solution Inhale 1 vial via nebulizer every 6 hours for wheezing as needed 75 mL 11  . albuterol (VENTOLIN HFA) 108 (90 Base) MCG/ACT inhaler Inhale 2 puffs by mouth every 6 hours as needed for wheezing or shortness of breath (Patient taking differently: Inhale 2 puffs into the lungs every 6 (six) hours as needed for wheezing or shortness of breath.) 8.5 g 11  . Alcohol Swabs (ALCOHOL PADS) 70 % PADS SMARTSIG:[place]get(s) Topical Daily    . allopurinol (ZYLOPRIM) 300 MG tablet Take 150 mg by mouth 2 (two) times daily.    Marland Kitchen amLODipine (NORVASC) 5 MG tablet Take 5 mg by mouth daily.    Marland Kitchen atorvastatin (LIPITOR) 80 MG tablet Take 80  mg by mouth daily.    Marland Kitchen b complex vitamins capsule Take 1 capsule by mouth daily. 30 capsule 5  . Blood Glucose Calibration (OT ULTRA/FASTTK CNTRL SOLN) SOLN See admin instructions.    . Blood Glucose Monitoring Suppl (ONETOUCH VERIO) w/Device KIT 1 Device by Does not apply route daily. 1 kit 0  . BREO ELLIPTA 100-25 MCG/INH AEPB Inhale 1 puff into the lungs daily.    . clopidogrel (PLAVIX) 75 MG tablet Take 75 mg by mouth daily.    . cyclobenzaprine (FLEXERIL) 10 MG tablet Take 1 tablet (10 mg total) by mouth 3 (three) times daily as needed for muscle spasms. 90 tablet 2  . diclofenac Sodium (VOLTAREN) 1 % GEL Apply 2 grams topically four times a day 100 g 11  . DULoxetine (CYMBALTA) 30 MG capsule Take 1 capsule (30 mg total) by mouth daily. 90 capsule 1  . famotidine (PEPCID) 20 MG tablet Take 20 mg by mouth 2 (two) times daily.    . furosemide (LASIX) 20 MG tablet Take 1 tablet (20 mg total) by mouth daily. 30 tablet 3  . gabapentin (NEURONTIN) 300 MG capsule Take 600 mg by mouth 2 (two) times daily.    Marland Kitchen glucose blood (ONETOUCH VERIO) test strip Use to check blood sugar once a day 100 each 12  . hydrALAZINE (APRESOLINE) 50 MG tablet Take 1 tablet (50 mg total) by mouth 3 (three) times daily. 90 tablet 2  . iron polysaccharides (NIFEREX) 150 MG capsule Take 1 capsule (150 mg total) by mouth daily. 30 capsule 5  . Lancet Devices (EASY MINI EJECT LANCING DEVICE) MISC daily.    . Lancets (ONETOUCH ULTRASOFT) lancets Use to check blood sugar once a day 100 each 12  . lidocaine (XYLOCAINE) 5 % ointment Apply 1 application topically as needed. Apply to R>L feet when burning up to 4x/day as needed 50 g 3  . metFORMIN (GLUCOPHAGE) 500 MG tablet  Take 1 tablet (500 mg total) by mouth daily. 90 tablet 0  . montelukast (SINGULAIR) 10 MG tablet Take 1 tablet (10 mg total) by mouth at bedtime. 30 tablet 11  . nitroGLYCERIN (NITROSTAT) 0.4 MG SL tablet Dissolve 1 tablet under tongue every 5 minutes, up to 3  doses for chest pain 25 tablet 11  . traMADol (ULTRAM) 50 MG tablet Take 1 tablet (50 mg total) by mouth 2 (two) times daily as needed. 30 tablet 0  . venetoclax (VENCLEXTA) 100 MG tablet Take 2 tablets (200 mg total) by mouth daily. Tablets should be swallowed whole with a meal and a full glass of water. 60 tablet 2   No current facility-administered medications for this visit.    REVIEW OF SYSTEMS:   10 Point review of Systems was done is negative except as noted above.  PHYSICAL EXAMINATION: ECOG FS:2 - Symptomatic, <50% confined to bed  Vitals:   05/18/21 1322  BP: (!) 115/51  Pulse: 74  Resp: 16  Temp: 97.6 F (36.4 C)  SpO2: 96%   Wt Readings from Last 3 Encounters:  05/18/21 146 lb (66.2 kg)  05/04/21 136 lb 12.8 oz (62.1 kg)  04/27/21 145 lb 8.1 oz (66 kg)   Body mass index is 26.7 kg/m.    Exam was given in wheelchair.  GENERAL:alert, in no acute distress and comfortable. Bruise on head that is swollen. SKIN: no acute rashes, no significant lesions EYES: conjunctiva are pink and non-injected, sclera anicteric OROPHARYNX: MMM, no exudates, no oropharyngeal erythema or ulceration NECK: supple, no JVD LYMPH:  no palpable lymphadenopathy in the cervical, axillary or inguinal regions LUNGS: clear to auscultation b/l with normal respiratory effort HEART: regular rate & rhythm ABDOMEN:  normoactive bowel sounds , non tender, not distended. Extremity: no pedal edema PSYCH: alert & oriented x 3 with fluent speech NEURO: no focal motor/sensory deficits   LABORATORY DATA:  I have reviewed the data as listed   CBC Latest Ref Rng & Units 05/18/2021 05/07/2021 05/04/2021  WBC 4.0 - 10.5 K/uL 8.0 8.7 9.8  Hemoglobin 12.0 - 15.0 g/dL 10.3(L) 10.9(L) 10.5(L)  Hematocrit 36.0 - 46.0 % 33.5(L) 35.4(L) 34.1(L)  Platelets 150 - 400 K/uL 221 219 254    . CMP Latest Ref Rng & Units 05/18/2021 05/07/2021 05/04/2021  Glucose 70 - 99 mg/dL 122(H) 110(H) 140(H)  BUN 8 - 23 mg/dL _0 Creatinine 0.44 - 1.00 mg/dL 1.58(H) 1.65(H) 1.48(H)  Sodium 135 - 145 mmol/L 141 141 142  Potassium 3.5 - 5.1 mmol/L 4.1 3.8 3.5  Chloride 98 - 111 mmol/L 105 104 105  CO2 22 - 32 mmol/L _1 Calcium 8.9 - 10.3 mg/dL 8.9 8.6(L) 9.1  Total Protein 6.5 - 8.1 g/dL 6.0(L) 6.2(L) 6.3(L)  Total Bilirubin 0.3 - 1.2 mg/dL 0.2(L) 0.4 0.5  Alkaline Phos 38 - 126 U/L 185(H) 207(H) 227(H)  AST 15 - 41 U/L 19 14(L) 15  ALT 0 - 44 U/L _2 .Marland Kitchen Lab Results  Component Value Date   IRON 37 (L) 02/06/2021   TIBC 424 02/06/2021   IRONPCTSAT 9 (L) 02/06/2021   (Iron and TIBC)  Lab Results  Component Value Date   FERRITIN 15 02/06/2021    Component     Latest Ref Rng & Units 02/06/2021  Vitamin B12     180 - 914 pg/mL 448  Haptoglobin     42 - 346 mg/dL 223   Lab  Results  Component Value Date   LDH 445 (H) 02/06/2021        . Lab Results  Component Value Date   LDH 445 (H) 02/06/2021    RADIOGRAPHIC STUDIES: I have personally reviewed the radiological images as listed and agreed with the findings in the report. DG ELBOW COMPLETE LEFT (3+VIEW)  Result Date: 04/27/2021 CLINICAL DATA:  75 year old female status post fall. On blood thinners. EXAM: LEFT ELBOW - COMPLETE 3+ VIEW COMPARISON:  Left forearm series today. FINDINGS: Bone mineralization is within normal limits for age. No evidence of joint effusion. Left radial head appears intact. Distal humerus and proximal ulna appear intact. No acute osseous abnormality identified. Several punctate calcific foci again noted in the forearm. IMPRESSION: No acute fracture or dislocation identified about the left elbow. Electronically Signed   By: Genevie Ann M.D.   On: 04/27/2021 04:45   DG ELBOW COMPLETE RIGHT (3+VIEW)  Result Date: 04/27/2021 CLINICAL DATA:  75 year old female status post fall. On blood thinners. EXAM: RIGHT ELBOW - COMPLETE 3+ VIEW COMPARISON:  None. FINDINGS: No evidence of joint effusion. Bone mineralization is  within normal limits for age. Radial head appears intact. Distal humerus appears intact with small chronic ossific fragment at the lateral epicondyle. No acute osseous abnormality identified. No discrete soft tissue injury. IMPRESSION: No acute fracture or dislocation identified about the right elbow. Electronically Signed   By: Genevie Ann M.D.   On: 04/27/2021 04:44   DG Forearm Left  Result Date: 04/27/2021 CLINICAL DATA:  75 year old female status post fall. On blood thinners. EXAM: LEFT FOREARM - 2 VIEW COMPARISON:  Left wrist series today reported separately. FINDINGS: Stable alignment at the left wrist. Bone mineralization is within normal limits. The left radius and ulna appear intact. Alignment at the left elbow appears maintained. There are 1 or 2 punctate calcific foci in the forearm soft tissues. But otherwise no discrete soft tissue injury. IMPRESSION: No acute fracture or dislocation identified about the left forearm. Electronically Signed   By: Genevie Ann M.D.   On: 04/27/2021 04:43   DG Wrist Complete Left  Result Date: 04/27/2021 CLINICAL DATA:  75 year old female status post fall. On blood thinners. EXAM: LEFT WRIST - COMPLETE 3+ VIEW COMPARISON:  None. FINDINGS: Bone mineralization is within normal limits for age. The lateral view is oblique. Distal radius and ulna appear intact. Carpal bone alignment and joint spaces are within normal limits for age. No carpal or metacarpal fracture identified. Incidental IV access artifact. No discrete soft tissue injury. IMPRESSION: No acute fracture or dislocation identified about the left wrist. Electronically Signed   By: Genevie Ann M.D.   On: 04/27/2021 04:42   DG Wrist Complete Right  Result Date: 04/27/2021 CLINICAL DATA:  75 year old female status post fall. On blood thinners. EXAM: RIGHT WRIST - COMPLETE 3+ VIEW COMPARISON:  None. FINDINGS: Bone mineralization is within normal limits for age. Distal radius and ulna are intact. Carpal bone alignment and  joint spaces are within normal limits for age. No acute metacarpal fracture. Possible healed 5th metacarpal fracture. No acute osseous abnormality identified. Mild soft tissue swelling. IMPRESSION: No acute fracture or dislocation identified about the right wrist. Electronically Signed   By: Genevie Ann M.D.   On: 04/27/2021 04:41   CT Head Wo Contrast  Result Date: 04/27/2021 CLINICAL DATA:  75 year old female status post fall. On blood thinners. History of prior meningioma resection. EXAM: CT HEAD WITHOUT CONTRAST TECHNIQUE: Contiguous axial images were obtained from the  base of the skull through the vertex without intravenous contrast. COMPARISON:  Brain MRI 02/23/2016.  Head CT 06/24/2015. Face and cervical spine CT today reported separately. FINDINGS: Brain: Chronic encephalomalacia at the left superior perirolandic cortex underlying chronic craniotomy defect. Elsewhere cerebral volume remains normal for age. No midline shift, ventriculomegaly, mass effect, evidence of mass lesion, intracranial hemorrhage or evidence of cortically based acute infarction. Stable gray-white matter differentiation throughout the brain. Vascular: Calcified atherosclerosis at the skull base. No suspicious intracranial vascular hyperdensity. Skull: Chronic left posterior vertex craniotomy. No acute osseous abnormality identified. Sinuses/Orbits: Visualized paranasal sinuses and mastoids are stable and well aerated. Other: Large, broad-based left scalp hematoma tracks over the left orbit in the preseptal space, and toward the vertex. No scalp soft tissue gas. Left globe and intraorbital soft tissues appear to remain normal. See also Face CT today reported separately. IMPRESSION: 1. Large left scalp hematoma tracking over the left orbit in the preseptal space. No underlying skull fracture identified. See also Face CT reported separately. 2. No acute intracranial abnormality. Stable left superior hemisphere encephalomalacia from remote  meningioma resection. Electronically Signed   By: Genevie Ann M.D.   On: 04/27/2021 04:51   CT Cervical Spine Wo Contrast  Result Date: 04/27/2021 CLINICAL DATA:  75 year old female status post fall. On blood thinners. EXAM: CT CERVICAL SPINE WITHOUT CONTRAST TECHNIQUE: Multidetector CT imaging of the cervical spine was performed without intravenous contrast. Multiplanar CT image reconstructions were also generated. COMPARISON:  Head and face CT today reported separately. Report of cervical spine MRI 02/22/2000 (no images available). FINDINGS: Alignment: Straightening of cervical lordosis. Cervicothoracic junction alignment is within normal limits. Bilateral posterior element alignment is within normal limits. Skull base and vertebrae: Visualized skull base is intact. No atlanto-occipital dissociation. C1 and C2 appear intact and aligned. No acute osseous abnormality identified. Soft tissues and spinal canal: No prevertebral fluid or swelling. No visible canal hematoma. Mild motion artifact at the pharynx. Calcified carotid atherosclerosis but otherwise negative visible neck soft tissues. Disc levels: Dominant cervical spine degenerative finding is facet arthropathy, with evidence of developing degenerative facet ankylosis at C2-C3 on the left, bilaterally at C6-C7. Disc and endplate degeneration is maximal at C4-C5. Mild if any associated spinal stenosis. Upper chest: Visible upper thoracic levels appear intact. Centrilobular emphysema in the lung apices. Calcified aortic atherosclerosis. Other: Small sebaceous cyst anterior to the left sternoclavicular joint, inconsequential. IMPRESSION: 1.  No acute traumatic injury identified in the cervical spine. 2. Facet degeneration with evidence of developing degenerative facet ankylosis at several levels. 3. Aortic Atherosclerosis (ICD10-I70.0) and Emphysema (ICD10-J43.9). Electronically Signed   By: Genevie Ann M.D.   On: 04/27/2021 04:59   CT Maxillofacial Wo  Contrast  Result Date: 04/27/2021 CLINICAL DATA:  75 year old female status post fall. On blood thinners. History of prior meningioma resection. EXAM: CT MAXILLOFACIAL WITHOUT CONTRAST TECHNIQUE: Multidetector CT imaging of the maxillofacial structures was performed. Multiplanar CT image reconstructions were also generated. COMPARISON:  Head CT today reported separately,  06/04/2015. FINDINGS: Osseous: Carious and absent bilateral dentition. Mandible intact and normally located. No maxilla fracture. No zygoma fracture. Pterygoid plates intact. No nasal bone fracture. Central skull base and visible calvarium appear intact. Cervical spine detailed separately. Orbits: No orbital wall fracture. Globes appear symmetric and intact. No intraorbital gas or hematoma. Left preseptal space hematoma contiguous with that of the forehead. Sinuses: Mild chronic paranasal sinus mucosal thickening has not significantly changed since 2016. Tympanic cavities and mastoids are clear. Soft tissues: Retained  secretions in the nasopharynx. Calcified carotid atherosclerosis in the neck. Otherwise negative visible noncontrast deep soft tissue spaces of the face. Limited intracranial: Stable to that reported separately. IMPRESSION: 1. Relatively large superficial left forehead and preseptal/periorbital hematoma. No underlying or acute facial fracture identified. 2. Poor dentition. Electronically Signed   By: Genevie Ann M.D.   On: 04/27/2021 04:55   Surgical Pathology 12/13/2017   ASSESSMENT & PLAN:   MARKEDA NARVAEZ is a wonderful 75 y.o. female with    1. Rai 1 Chronic Lymphocytic Leukemia  - Trisomy 12 mutation. No associated thrombocytopenia.  Mild anemia - likely from acute blood loss from significant epistaxis. No constitutional symptoms. Minimal LNaednopathy No splenomegaly  #2 .Lung nodules on CT in 12/2017. Rpt CT chest 08/09/2018 - stable  #3 Patient Active Problem List   Diagnosis Date Noted  . Fall 05/04/2021  .  Finger pain, right 05/04/2021  . Counseling regarding advance care planning and goals of care 02/19/2021  . Iron deficiency anemia 02/12/2021  . Peripheral arterial disease ([place]) 10/03/2020  . (HFpEF) heart failure with preserved ejection fraction ([place]) 06/11/2020  . Venous insufficiency (chronic) (peripheral) 06/22/2019  . Pulmonary nodules 01/04/2018  . Mass of left lower leg 01/04/2018  . Chronic kidney disease (CKD), stage III (moderate) ([place]) 06/04/2016  . Chronic back pain 10/02/2015  . DM (diabetes mellitus), type 2 with peripheral vascular complications ([place]) 94/85/4627  . Chronic lymphocytic leukemia ([place]) 04/11/2009  . PERIPHERAL VASCULAR DISEASE 10/13/2007  . HYPERCHOLESTEROLEMIA 02/23/2007  . ANXIETY 02/23/2007  . TOBACCO DEPENDENCE 02/23/2007  . Essential hypertension 02/23/2007  . COPD (chronic obstructive pulmonary disease) ([place]) 02/23/2007  . GASTROESOPHAGEAL REFLUX, NO ESOPHAGITIS 02/23/2007    PLAN: -Discussed pt labwork today, 05/18/2021; counts and chemistries stable, WBC normal, Uric Acid normal, mild anemia, Plt normal. -Will get CT Head to ensure no major injuries. The head took the brunt of the fall and the pt was not able to break fall with hands and feet, but she tried to. -Will send referral to PT to address imbalance issues and recent falls. -Continue with 200 mg Venetoclax at this time. -Continue to wear compression socks and elevate legs to alleviate leg swelling.  -Recommended pt drink 48-64 oz water daily. -Continue PO Iron and B-complex daily.  -Advised pt that she may have concussion and could be more irritable to light and noises. -Will get CT Head STAT due to fall and recent swelling. -Will see back in 1 month with labs.    FOLLOW UP: CT head wo contrast stat today RTC with Dr Irene Limbo with labs in about 1 month    The total time spent in the appointment was 30 minutes and more than 50% was on counseling and direct patient cares.    All of  the patient's questions were answered with apparent satisfaction. The patient knows to call the clinic with any problems, questions or concerns.    Sullivan Lone MD [place] AAHIVMS [facility] Hematology/Oncology Physician [facility]  (Office):       [phone] (Work cell):  [phone] (Fax):           [phone]  I, Reinaldo Raddle, am acting as scribe for Dr. Sullivan Lone, MD.   .I have reviewed the above documentation for accuracy and completeness, and I agree with the above. Brunetta Genera MD   ADDENDUM  CT head- 5/24:  IMPRESSION: No acute traumatic finding.  Previous left parietal vertex craniotomy for resection of meningioma. Underlying encephalomalacia  of the left parietal vertex. Small amount thickening of the dural tissue anterior to the resection presumed represent small amount of residual meningioma, not visibly grown since the MRI of February 2017.   Electronically Signed   By: Nelson Chimes M.D.   On: 05/19/2021 13:00

## 2021-05-18 ENCOUNTER — Encounter: Payer: Self-pay | Admitting: Family Medicine

## 2021-05-18 ENCOUNTER — Other Ambulatory Visit: Payer: Self-pay

## 2021-05-18 ENCOUNTER — Other Ambulatory Visit: Payer: Self-pay | Admitting: *Deleted

## 2021-05-18 ENCOUNTER — Inpatient Hospital Stay (HOSPITAL_BASED_OUTPATIENT_CLINIC_OR_DEPARTMENT_OTHER): Payer: Medicare Other | Admitting: Hematology

## 2021-05-18 ENCOUNTER — Inpatient Hospital Stay: Payer: Medicare Other

## 2021-05-18 VITALS — BP 115/51 | HR 74 | Temp 97.6°F | Resp 16 | Ht 62.0 in | Wt 146.0 lb

## 2021-05-18 DIAGNOSIS — S0990XA Unspecified injury of head, initial encounter: Secondary | ICD-10-CM | POA: Diagnosis not present

## 2021-05-18 DIAGNOSIS — C9110 Chronic lymphocytic leukemia of B-cell type not having achieved remission: Secondary | ICD-10-CM

## 2021-05-18 LAB — CMP (CANCER CENTER ONLY)
ALT: 15 U/L (ref 0–44)
AST: 19 U/L (ref 15–41)
Albumin: 3.6 g/dL (ref 3.5–5.0)
Alkaline Phosphatase: 185 U/L — ABNORMAL HIGH (ref 38–126)
Anion gap: 5 (ref 5–15)
BUN: 14 mg/dL (ref 8–23)
CO2: 31 mmol/L (ref 22–32)
Calcium: 8.9 mg/dL (ref 8.9–10.3)
Chloride: 105 mmol/L (ref 98–111)
Creatinine: 1.58 mg/dL — ABNORMAL HIGH (ref 0.44–1.00)
GFR, Estimated: 34 mL/min — ABNORMAL LOW (ref >60)
Glucose, Bld: 122 mg/dL — ABNORMAL HIGH (ref 70–99)
Potassium: 4.1 mmol/L (ref 3.5–5.1)
Sodium: 141 mmol/L (ref 135–145)
Total Bilirubin: 0.2 mg/dL — ABNORMAL LOW (ref 0.3–1.2)
Total Protein: 6 g/dL — ABNORMAL LOW (ref 6.5–8.1)

## 2021-05-18 LAB — URIC ACID: Uric Acid, Serum: 3.5 mg/dL (ref 2.5–7.1)

## 2021-05-18 LAB — CBC WITH DIFFERENTIAL (CANCER CENTER ONLY)
Abs Immature Granulocytes: 0.02 10*3/uL (ref 0.00–0.07)
Basophils Absolute: 0 10*3/uL (ref 0.0–0.1)
Basophils Relative: 0 %
Eosinophils Absolute: 0 10*3/uL (ref 0.0–0.5)
Eosinophils Relative: 0 %
HCT: 33.5 % — ABNORMAL LOW (ref 36.0–46.0)
Hemoglobin: 10.3 g/dL — ABNORMAL LOW (ref 12.0–15.0)
Immature Granulocytes: 0 %
Lymphocytes Relative: 33 %
Lymphs Abs: 2.7 10*3/uL (ref 0.7–4.0)
MCH: 27.8 pg (ref 26.0–34.0)
MCHC: 30.7 g/dL (ref 30.0–36.0)
MCV: 90.5 fL (ref 80.0–100.0)
Monocytes Absolute: 1.4 10*3/uL — ABNORMAL HIGH (ref 0.1–1.0)
Monocytes Relative: 17 %
Neutro Abs: 3.8 10*3/uL (ref 1.7–7.7)
Neutrophils Relative %: 50 %
Platelet Count: 221 10*3/uL (ref 150–400)
RBC: 3.7 MIL/uL — ABNORMAL LOW (ref 3.87–5.11)
RDW: 20.1 % — ABNORMAL HIGH (ref 11.5–15.5)
WBC Count: 8 10*3/uL (ref 4.0–10.5)
nRBC: 0 % (ref 0.0–0.2)

## 2021-05-19 ENCOUNTER — Ambulatory Visit (HOSPITAL_COMMUNITY)
Admission: RE | Admit: 2021-05-19 | Discharge: 2021-05-19 | Disposition: A | Discharge status: Home / Self Care | Payer: Medicare Other | Source: Ambulatory Visit | Attending: Hematology | Admitting: Hematology

## 2021-05-19 DIAGNOSIS — S0990XA Unspecified injury of head, initial encounter: Secondary | ICD-10-CM | POA: Insufficient documentation

## 2021-05-22 ENCOUNTER — Other Ambulatory Visit: Payer: Medicare Other

## 2021-05-22 ENCOUNTER — Ambulatory Visit: Payer: Medicare Other

## 2021-05-24 ENCOUNTER — Encounter: Payer: Self-pay | Admitting: Hematology

## 2021-05-28 ENCOUNTER — Ambulatory Visit: Payer: Medicare Other | Attending: Family Medicine | Admitting: Physical Therapy

## 2021-05-29 ENCOUNTER — Other Ambulatory Visit: Payer: Medicare Other

## 2021-05-29 ENCOUNTER — Ambulatory Visit: Payer: Medicare Other

## 2021-06-02 ENCOUNTER — Other Ambulatory Visit: Payer: Self-pay

## 2021-06-02 ENCOUNTER — Telehealth: Payer: Self-pay

## 2021-06-02 DIAGNOSIS — I739 Peripheral vascular disease, unspecified: Secondary | ICD-10-CM

## 2021-06-02 NOTE — Telephone Encounter (Signed)
Patient calls today to report that her left leg burns all the time. "Doesn't matter if I'm walking or not doing nothing - it hurts like my right leg used to." Unclear on how long this has been occurring, she denies any open wounds. Her right leg is swollen, but is not bothering her. Placed on schedule for ABI, lle duplex and follow up.

## 2021-06-03 ENCOUNTER — Other Ambulatory Visit: Payer: Self-pay | Admitting: Physical Medicine and Rehabilitation

## 2021-06-09 ENCOUNTER — Other Ambulatory Visit (HOSPITAL_COMMUNITY): Payer: Self-pay

## 2021-06-10 ENCOUNTER — Telehealth: Payer: Self-pay | Admitting: Family Medicine

## 2021-06-10 NOTE — Telephone Encounter (Signed)
[place] DM[employer] Parking Placard form dropped off for at front desk for completion.  Verified that patient section of form has been completed.  Last DOS/WCC with PCP was 05/04/21  Placed form in team folder to be completed by clinical staff.  Creig Hines

## 2021-06-10 NOTE — Telephone Encounter (Signed)
Reviewed form and placed in PCP's box for completion.  .Kaiyla Stahly R Derris Millan, CMA  

## 2021-06-11 ENCOUNTER — Other Ambulatory Visit (HOSPITAL_COMMUNITY): Payer: Self-pay

## 2021-06-12 ENCOUNTER — Other Ambulatory Visit: Payer: Self-pay | Admitting: Family Medicine

## 2021-06-12 NOTE — Telephone Encounter (Signed)
Patient called and informed that forms are ready for pick up. Copy made and placed in batch scanning. Original placed at front desk for pick up.   Trentyn Boisclair C Latoy Labriola, RN  

## 2021-06-15 ENCOUNTER — Ambulatory Visit (HOSPITAL_COMMUNITY): Payer: Medicare Other | Attending: Surgery

## 2021-06-15 ENCOUNTER — Ambulatory Visit (HOSPITAL_COMMUNITY): Payer: Medicare Other

## 2021-06-15 ENCOUNTER — Ambulatory Visit: Payer: Medicare Other | Admitting: Surgery

## 2021-06-19 ENCOUNTER — Other Ambulatory Visit: Payer: Self-pay | Admitting: Hematology

## 2021-07-01 ENCOUNTER — Other Ambulatory Visit: Payer: Self-pay | Admitting: Cardiology

## 2021-07-03 ENCOUNTER — Other Ambulatory Visit: Payer: Self-pay | Admitting: Family Medicine

## 2021-07-03 DIAGNOSIS — M545 Low back pain, unspecified: Secondary | ICD-10-CM

## 2021-07-03 DIAGNOSIS — W19XXXA Unspecified fall, initial encounter: Secondary | ICD-10-CM

## 2021-07-03 DIAGNOSIS — G8929 Other chronic pain: Secondary | ICD-10-CM

## 2021-07-06 ENCOUNTER — Other Ambulatory Visit (HOSPITAL_COMMUNITY): Payer: Self-pay

## 2021-07-08 ENCOUNTER — Other Ambulatory Visit: Payer: Self-pay | Admitting: Family Medicine

## 2021-07-08 DIAGNOSIS — W19XXXA Unspecified fall, initial encounter: Secondary | ICD-10-CM

## 2021-07-08 DIAGNOSIS — G8929 Other chronic pain: Secondary | ICD-10-CM

## 2021-07-08 DIAGNOSIS — M545 Low back pain, unspecified: Secondary | ICD-10-CM

## 2021-07-08 NOTE — Telephone Encounter (Signed)
The original script sent on 7/11 does not have appropriate dosing or sig instructions. Please resend this one.

## 2021-07-09 ENCOUNTER — Other Ambulatory Visit (HOSPITAL_COMMUNITY): Payer: Self-pay

## 2021-07-10 NOTE — Telephone Encounter (Signed)
Pharmacy calling again to check the status prescription change. Please advise.

## 2021-07-13 NOTE — Progress Notes (Signed)
HEMATOLOGY/ONCOLOGY CLINIC NOTE  Date of Service: 07/14/21    Patient Care Team: Delora Fuel, MD as PCP - General (Family Medicine) Sueanne Margarita, MD as PCP - Cardiology (Cardiology) Danna Hefty, DO (Family Medicine)  CHIEF COMPLAINTS/PURPOSE OF CONSULTATION:  F/u for CLL  HISTORY OF PRESENTING ILLNESS:  [name] [name] is a wonderful 75 y.o. female smoker with history of CLL who has been referred to [place] by Dr Delora Fuel, MD at [facility] for evaluation and management of elevated WBC/lymphocytes.   She is accompanied by her husband. She reported to ED on 11/27/2017 with cough and abdominal pain with COPD exacerbation. Her labs on this day showed WBC elevated at 62.5. She has not previously seen a hematologist. She reports a plan to quit smoking on the first day of the new year. She reports worsening decrease in balance and states she has a cane and walker that she uses as needed. She was taking Prednisone.  No weight loss/fevers/chills/night sweats/ palpable lumps or bumps.  On review of systems, pt reports back pain, change in balance, abdominal pain, weight loss and denies changes in BM, fever and any other accompanying symptoms.   INTERVAL HISTORY:  [name] [name] is here for management and evaluation of her Chronic Leukocytic Leukemia. The patient's last visit with [place] was on 05/18/2021. The pt reports that she is doing well overall. We are joined today by her husband.  The pt reports that she had another fall recently as of last week. She notes that this time she tripped over things in her wash room again. She uses her walker when inside her house and cane when outside. She has not been seen by PT and does not desire a referral. The patient had a bad experience with the PT on [facility] the last time she was there, but notes it has been a while since then.  She has recently been switched to Dr. Manus Rudd and does not have a visit until September. She denies  any medication changes.  Lab results today 07/14/2021 of CBC w/diff and CMP is as follows: all values are WNL except for RDW of 18.4, Monocytes of 1.1K, Glucose of 101, Creatinine of 1.63, Total protein of 6.2, Alkaline Phosphatase of 147, GFR est of 33. 07/14/2021 LDH . Lab Results  Component Value Date   LDH 148 07/14/2021   On review of systems, pt reports recent fall, leg swelling, back pain/weakness and denies fevers, chills, night sweats, diarrhea, neuropathy in feet, decreased appetite, and any other symptoms.   MEDICAL HISTORY:  Past Medical History:  Diagnosis Date   Allergy    environmental   Asthma    Brain tumor ([place])    CKD (chronic kidney disease)    CLL (chronic lymphoblastic leukemia)    CLL (chronic lymphocytic leukemia) (HCC)    Constipation 11/15/2011   COPD (chronic obstructive pulmonary disease) (HCC)    CVA (cerebral vascular accident) ([place])    Diabetes ([place])    Diabetes mellitus without complication ([place])    GERD (gastroesophageal reflux disease)    Headache    migraines   Heart murmur    Hyperlipidemia    Hypertension    Leukemia ([place])    PVD (peripheral vascular disease) ([place])    Stroke ([place]) 1998   in 1998 due to tumor-right side    SURGICAL HISTORY: Past Surgical History:  Procedure Laterality Date   ABDOMINAL AORTOGRAM W/LOWER EXTREMITY Bilateral 08/12/2020   Procedure: ABDOMINAL  AORTOGRAM W/BILATERAL LOWER EXTREMITY RUNOFF;  Surgeon: Serafina Mitchell, MD;  Location: [facility];  Service: Cardiovascular;  Laterality: Bilateral;   ABI  02/2012   ABI <0.65 BL 02/2012   BRAIN MENINGIOMA EXCISION     BRAIN TUMOR EXCISION     Bypass grafting of RLE for PAD claudication      CARDIAC CATHETERIZATION  04/09/208   CESAREAN SECTION     1974, 78 ,80   CHOLECYSTECTOMY, LAPAROSCOPIC     COLONOSCOPY  2014   [place], [place]   ENDARTERECTOMY FEMORAL Right 10/03/2020   Procedure: RIGHT FEMORAL ENDARTERECTOMY WITH VEIN PATCH ANGIOPLASTY;  Surgeon:  Serafina Mitchell, MD;  Location: [facility];  Service: Vascular;  Laterality: Right;   ESOPHAGEAL DILATION     FEMORAL-POPLITEAL BYPASS GRAFT Right 10/03/2020   Procedure: RIGHT FEMORAL- BELOW KNEE POPLITEAL BYPASS USING GORE PROPATEN 6MM GRAFT;  Surgeon: Serafina Mitchell, MD;  Location: [facility];  Service: Vascular;  Laterality: Right;   PATCH ANGIOPLASTY  10/03/2020   Procedure: VEIN PATCH ANGIOPLASTY;  Surgeon: Serafina Mitchell, MD;  Location: [facility] OR;  Service: Vascular;;    SOCIAL HISTORY: Social History   Socioeconomic History   Marital status: Single    Spouse name: Not on file   Number of children: 3   Years of education: Not on file   Highest education level: Not on file  Occupational History   Occupation: [employer]    Employer: DISABLED  Tobacco Use   Smoking status: Every Day    Packs/day: 0.25    Years: 30.00    Pack years: 7.50    Types: Cigarettes    Start date: 01/01/1976   Smokeless tobacco: Never   Tobacco comments:    ~ 3 cigarettes / day  Vaping Use   Vaping Use: Never used  Substance and Sexual Activity   Alcohol use: Not Currently   Drug use: Not Currently   Sexual activity: Yes  Other Topics Concern   Not on file  Social History Narrative   ** Merged History Encounter **       Health Care POA:  Emergency Contact: Val Riles [phone] (c) End of Life Plan:  Who lives with you: Lives with husband Any pets: none Diet: Patient lacks financial resources for much food. Pt reports eating what is available. Exercise: Patient    does not have an exercise plan. Seatbelts: Patient reports wearing seatbelt when in vehicle.  [name] Exposure/Protection: Hobbies: Bowling, computer games, Bingo  Has financial difficulties and transportation issues as she and her husband share transpo   rtation      Social Determinants of Health   Financial Resource Strain: Not on file  Food Insecurity: Not on file  Transportation Needs: Not on file  Physical Activity: Not on file   Stress: Not on file  Social Connections: Not on file  Intimate Partner Violence: Not on file    FAMILY HISTORY: Family History  Problem Relation Age of Onset   Heart disease Mother    Asthma Mother    Cancer Mother        uterine    Depression Mother    Heart attack Mother 42   Hyperlipidemia Mother    Hypertension Mother    Stroke Mother    Kidney disease Mother    Heart attack Sister    Stroke Sister    Depression Sister    Diabetes Sister    Hyperlipidemia Sister    Depression Sister  Diabetes Sister    Hyperlipidemia Sister    HIV/AIDS Sister    Diabetes Brother    Asthma Brother    Hypertension Brother    Cancer Maternal Aunt        lung   Heart disease Maternal Grandmother    Heart attack Maternal Grandmother    Cancer Brother        colon   HIV/AIDS Brother    Colon cancer Neg Hx     ALLERGIES:  is allergic to gazyva [obinutuzumab], aspirin, no healthtouch food allergies, aspirin, and baclofen.  MEDICATIONS:  Current Outpatient Medications  Medication Sig Dispense Refill   traMADol (ULTRAM) 50 MG tablet Take 1 tablet twice daily as needed for moderate to severe pain  Will fill for 1 months supply, patient will need to come to clinic to be evaluated for future refill. 60 tablet 0   acetaminophen (TYLENOL) 500 MG tablet Take 500-1,000 mg by mouth every 6 (six) hours as needed for moderate pain.      albuterol (ACCUNEB) 0.63 MG/3ML nebulizer solution Inhale 1 vial via nebulizer every 6 hours for wheezing as needed 75 mL 11   albuterol (VENTOLIN HFA) 108 (90 Base) MCG/ACT inhaler Inhale 2 puffs into the lungs every 6 (six) hours as needed for wheezing or shortness of breath. 8.5 each 11   Alcohol Swabs (ALCOHOL PADS) 70 % PADS SMARTSIG:[place](s) Topical Daily     allopurinol (ZYLOPRIM) 300 MG tablet Take 1/2 (one-half) tablet by mouth twice daily 30 tablet 0   amLODipine (NORVASC) 5 MG tablet Take 5 mg by mouth daily.     atorvastatin (LIPITOR) 80 MG  tablet Take 1 tablet by mouth every day 30 tablet 6   b complex vitamins capsule Take 1 capsule by mouth daily. 30 capsule 5   Blood Glucose Calibration (OT ULTRA/FASTTK CNTRL SOLN) SOLN See admin instructions.     Blood Glucose Monitoring Suppl (ONETOUCH VERIO) w/Device KIT 1 Device by Does not apply route daily. 1 kit 0   BREO ELLIPTA 100-25 MCG/INH AEPB Inhale 1 puff into the lungs daily.     clopidogrel (PLAVIX) 75 MG tablet Take 75 mg by mouth daily.     cyclobenzaprine (FLEXERIL) 10 MG tablet Take 1 tablet (10 mg total) by mouth 3 (three) times daily as needed for muscle spasms. 90 tablet 2   diclofenac Sodium (VOLTAREN) 1 % GEL Apply 2 grams topically four times a day 100 g 11   DULoxetine (CYMBALTA) 30 MG capsule Take 1 capsule (30 mg total) by mouth daily. 90 capsule 1   famotidine (PEPCID) 20 MG tablet Take 20 mg by mouth 2 (two) times daily.     furosemide (LASIX) 20 MG tablet Take 1 tablet (20 mg total) by mouth daily. 30 tablet 3   gabapentin (NEURONTIN) 300 MG capsule Take 600 mg by mouth 2 (two) times daily.     glucose blood (ONETOUCH VERIO) test strip Use to check blood sugar once a day 100 each 12   hydrALAZINE (APRESOLINE) 50 MG tablet Take 1 tablet (50 mg total) by mouth 3 (three) times daily. 90 tablet 2   iron polysaccharides (NIFEREX) 150 MG capsule Take 1 capsule (150 mg total) by mouth daily. 30 capsule 5   Lancet Devices (EASY MINI EJECT LANCING DEVICE) MISC daily.     Lancets (ONETOUCH ULTRASOFT) lancets Use to check blood sugar once a day 100 each 12   lidocaine (XYLOCAINE) 5 % ointment Apply 1 application topically as needed. Apply  to R>L feet when burning up to 4x/day as needed 50 g 3   metFORMIN (GLUCOPHAGE) 500 MG tablet Take 1 tablet (500 mg total) by mouth daily. 90 tablet 0   montelukast (SINGULAIR) 10 MG tablet Take 1 tablet (10 mg total) by mouth at bedtime. 30 tablet 11   nitroGLYCERIN (NITROSTAT) 0.4 MG SL tablet Dissolve 1 tablet under tongue every 5  minutes, up to 3 doses for chest pain 25 tablet 11   venetoclax (VENCLEXTA) 100 MG tablet Take 2 tablets (200 mg total) by mouth daily. Tablets should be swallowed whole with a meal and a full glass of water. 60 tablet 2   No current facility-administered medications for this visit.    REVIEW OF SYSTEMS:   10 Point review of Systems was done is negative except as noted above.  PHYSICAL EXAMINATION: ECOG FS:2 - Symptomatic, <50% confined to bed  Vitals:   07/14/21 0908  BP: (!) 155/74  Pulse: 69  Resp: 18  Temp: 98.7 F (37.1 C)  SpO2: 100%    Wt Readings from Last 3 Encounters:  07/14/21 143 lb 9.6 oz (65.1 kg)  05/18/21 146 lb (66.2 kg)  05/04/21 136 lb 12.8 oz (62.1 kg)   Body mass index is 26.26 kg/m.    Exam was given in a wheelchair.   GENERAL:alert, in no acute distress and comfortable SKIN: no acute rashes, no significant lesions EYES: conjunctiva are pink and non-injected, sclera anicteric OROPHARYNX: MMM, no exudates, no oropharyngeal erythema or ulceration NECK: supple, no JVD LYMPH:  no palpable lymphadenopathy in the cervical, axillary or inguinal regions LUNGS: clear to auscultation b/l with normal respiratory effort HEART: regular rate & rhythm ABDOMEN:  normoactive bowel sounds , non tender, not distended. Extremity: trace pedal edema PSYCH: alert & oriented x 3 with fluent speech NEURO: no focal motor/sensory deficits   LABORATORY DATA:  I have reviewed the data as listed   CBC Latest Ref Rng & Units 07/14/2021 05/18/2021 05/07/2021  WBC 4.0 - 10.5 K/uL 6.5 8.0 8.7  Hemoglobin 12.0 - 15.0 g/dL 12.1 10.3(L) 10.9(L)  Hematocrit 36.0 - 46.0 % 37.5 33.5(L) 35.4(L)  Platelets 150 - 400 K/uL 206 221 219    . CMP Latest Ref Rng & Units 07/14/2021 05/18/2021 05/07/2021  Glucose 70 - 99 mg/dL 101(H) 122(H) 110(H)  BUN 8 - 23 mg/dL '16 14 11  ' Creatinine 0.44 - 1.00 mg/dL 1.63(H) 1.58(H) 1.65(H)  Sodium 135 - 145 mmol/L 142 141 141  Potassium 3.5 - 5.1  mmol/L 3.8 4.1 3.8  Chloride 98 - 111 mmol/L 104 105 104  CO2 22 - 32 mmol/L '31 31 28  ' Calcium 8.9 - 10.3 mg/dL 9.1 8.9 8.6(L)  Total Protein 6.5 - 8.1 g/dL 6.2(L) 6.0(L) 6.2(L)  Total Bilirubin 0.3 - 1.2 mg/dL 0.4 0.2(L) 0.4  Alkaline Phos 38 - 126 U/L 147(H) 185(H) 207(H)  AST 15 - 41 U/L 21 19 14(L)  ALT 0 - 44 U/L '23 15 7   ' .. Lab Results  Component Value Date   IRON 37 (L) 02/06/2021   TIBC 424 02/06/2021   IRONPCTSAT 9 (L) 02/06/2021   (Iron and TIBC)  Lab Results  Component Value Date   FERRITIN 15 02/06/2021    Component     Latest Ref Rng & Units 02/06/2021  Vitamin B12     180 - 914 pg/mL 448  Haptoglobin     42 - 346 mg/dL 223   Lab Results  Component Value Date  LDH 445 (H) 02/06/2021        . Lab Results  Component Value Date   LDH 445 (H) 02/06/2021    RADIOGRAPHIC STUDIES: I have personally reviewed the radiological images as listed and agreed with the findings in the report. No results found.  Surgical Pathology 12/13/2017   ASSESSMENT & PLAN:   CHARESE ABUNDIS is a wonderful 75 y.o. female with    1. Rai 1 Chronic Lymphocytic Leukemia  - Trisomy 12 mutation. No associated thrombocytopenia.  Mild anemia - likely from acute blood loss from significant epistaxis. No constitutional symptoms. Minimal LNaednopathy No splenomegaly  #2 .Lung nodules on CT in 12/2017. Rpt CT chest 08/09/2018 - stable  #3 Patient Active Problem List   Diagnosis Date Noted   Fall 05/04/2021   Finger pain, right 05/04/2021   Counseling regarding advance care planning and goals of care 02/19/2021   Iron deficiency anemia 02/12/2021   Peripheral arterial disease ([place]) 10/03/2020   (HFpEF) heart failure with preserved ejection fraction ([place]) 06/11/2020   Venous insufficiency (chronic) (peripheral) 06/22/2019   Pulmonary nodules 01/04/2018   Mass of left lower leg 01/04/2018   Chronic kidney disease (CKD), stage III (moderate) ([place]) 06/04/2016   Chronic  back pain 10/02/2015   DM (diabetes mellitus), type 2 with peripheral vascular complications ([place]) 07/37/1062   Chronic lymphocytic leukemia ([place]) 04/11/2009   PERIPHERAL VASCULAR DISEASE 10/13/2007   HYPERCHOLESTEROLEMIA 02/23/2007   ANXIETY 02/23/2007   TOBACCO DEPENDENCE 02/23/2007   Essential hypertension 02/23/2007   COPD (chronic obstructive pulmonary disease) ([place]) 02/23/2007   GASTROESOPHAGEAL REFLUX, NO ESOPHAGITIS 02/23/2007    PLAN: -Discussed pt labwork today, 07/14/2021; chemistries stable, counts improved, Hgb improved, anemia resolved.  -Recommended pt get a repeat referral to PT for helping with imbalance and recent falls. Pt can request a different person this time if desired. -Emphasized importance of improving her balance and avoiding more falls. The pt has been having a major fall frequently and needs PT to maintain independence and avoiding a wheelchair. -Recommended pt connect with PT and social worker to see about getting her walker fixed/new walker. -Recommended pt have PCP do a work-up for her back pain and potential causes for imbalance and falls. -Continue with 200 mg Venetoclax at this time. Will not escalate this due to improvement in labs and toleration. -Continue to wear compression socks and elevate legs to alleviate leg swelling.  -Recommended pt drink 48-64 oz water daily. -Continue PO Iron and B-complex daily.  -Referral to outpatient [facility] PT. -Will see back in 2 months with labs.    FOLLOW UP: RTC with Dr Irene Limbo with labs in about 2 month Referral to rehab for balance.    The total time spent in the appointment was 20 minutes and more than 50% was on counseling and direct patient cares.    All of the patient's questions were answered with apparent satisfaction. The patient knows to call the clinic with any problems, questions or concerns.    Sullivan Lone MD [place] AAHIVMS [facility] Hematology/Oncology Physician [facility]  (Office):        [phone] (Work cell):  [phone] (Fax):           [phone]  I, Reinaldo Raddle, am acting as scribe for Dr. Sullivan Lone, MD.   .I have reviewed the above documentation for accuracy and completeness, and I agree with the above. Brunetta Genera MD

## 2021-07-14 ENCOUNTER — Inpatient Hospital Stay: Payer: Medicare Other | Attending: Hematology

## 2021-07-14 ENCOUNTER — Inpatient Hospital Stay (HOSPITAL_BASED_OUTPATIENT_CLINIC_OR_DEPARTMENT_OTHER): Payer: Medicare Other | Admitting: Hematology

## 2021-07-14 ENCOUNTER — Other Ambulatory Visit: Payer: Self-pay

## 2021-07-14 ENCOUNTER — Other Ambulatory Visit: Payer: Self-pay | Admitting: Family Medicine

## 2021-07-14 VITALS — BP 155/74 | HR 69 | Temp 98.7°F | Resp 18 | Ht 62.0 in | Wt 143.6 lb

## 2021-07-14 DIAGNOSIS — C9110 Chronic lymphocytic leukemia of B-cell type not having achieved remission: Secondary | ICD-10-CM | POA: Diagnosis not present

## 2021-07-14 DIAGNOSIS — R911 Solitary pulmonary nodule: Secondary | ICD-10-CM | POA: Diagnosis not present

## 2021-07-14 DIAGNOSIS — G8929 Other chronic pain: Secondary | ICD-10-CM

## 2021-07-14 DIAGNOSIS — W19XXXA Unspecified fall, initial encounter: Secondary | ICD-10-CM

## 2021-07-14 DIAGNOSIS — M545 Low back pain, unspecified: Secondary | ICD-10-CM

## 2021-07-14 LAB — CBC WITH DIFFERENTIAL/PLATELET
Abs Immature Granulocytes: 0.01 10*3/uL (ref 0.00–0.07)
Basophils Absolute: 0 10*3/uL (ref 0.0–0.1)
Basophils Relative: 0 %
Eosinophils Absolute: 0.1 10*3/uL (ref 0.0–0.5)
Eosinophils Relative: 2 %
HCT: 37.5 % (ref 36.0–46.0)
Hemoglobin: 12.1 g/dL (ref 12.0–15.0)
Immature Granulocytes: 0 %
Lymphocytes Relative: 39 %
Lymphs Abs: 2.6 10*3/uL (ref 0.7–4.0)
MCH: 28.3 pg (ref 26.0–34.0)
MCHC: 32.3 g/dL (ref 30.0–36.0)
MCV: 87.8 fL (ref 80.0–100.0)
Monocytes Absolute: 1.1 10*3/uL — ABNORMAL HIGH (ref 0.1–1.0)
Monocytes Relative: 17 %
Neutro Abs: 2.7 10*3/uL (ref 1.7–7.7)
Neutrophils Relative %: 42 %
Platelets: 206 10*3/uL (ref 150–400)
RBC: 4.27 MIL/uL (ref 3.87–5.11)
RDW: 18.4 % — ABNORMAL HIGH (ref 11.5–15.5)
WBC: 6.5 10*3/uL (ref 4.0–10.5)
nRBC: 0 % (ref 0.0–0.2)

## 2021-07-14 LAB — CMP (CANCER CENTER ONLY)
ALT: 23 U/L (ref 0–44)
AST: 21 U/L (ref 15–41)
Albumin: 3.5 g/dL (ref 3.5–5.0)
Alkaline Phosphatase: 147 U/L — ABNORMAL HIGH (ref 38–126)
Anion gap: 7 (ref 5–15)
BUN: 16 mg/dL (ref 8–23)
CO2: 31 mmol/L (ref 22–32)
Calcium: 9.1 mg/dL (ref 8.9–10.3)
Chloride: 104 mmol/L (ref 98–111)
Creatinine: 1.63 mg/dL — ABNORMAL HIGH (ref 0.44–1.00)
GFR, Estimated: 33 mL/min — ABNORMAL LOW (ref >60)
Glucose, Bld: 101 mg/dL — ABNORMAL HIGH (ref 70–99)
Potassium: 3.8 mmol/L (ref 3.5–5.1)
Sodium: 142 mmol/L (ref 135–145)
Total Bilirubin: 0.4 mg/dL (ref 0.3–1.2)
Total Protein: 6.2 g/dL — ABNORMAL LOW (ref 6.5–8.1)

## 2021-07-14 LAB — LACTATE DEHYDROGENASE: LDH: 148 U/L (ref 98–192)

## 2021-07-15 ENCOUNTER — Telehealth: Payer: Self-pay | Admitting: Hematology

## 2021-07-15 NOTE — Telephone Encounter (Signed)
Scheduled follow-up appointment per 7/19 los. Patient is aware. 

## 2021-07-16 ENCOUNTER — Other Ambulatory Visit: Payer: Self-pay | Admitting: *Deleted

## 2021-07-16 DIAGNOSIS — G8929 Other chronic pain: Secondary | ICD-10-CM

## 2021-07-16 DIAGNOSIS — M545 Low back pain, unspecified: Secondary | ICD-10-CM

## 2021-07-16 DIAGNOSIS — W19XXXA Unspecified fall, initial encounter: Secondary | ICD-10-CM

## 2021-07-16 NOTE — Telephone Encounter (Signed)
Tramadol already refilled

## 2021-07-17 MED ORDER — TRAMADOL HCL 50 MG PO TABS: ORAL_TABLET | ORAL | 0 refills | Status: DC

## 2021-07-19 ENCOUNTER — Other Ambulatory Visit: Payer: Self-pay | Admitting: Physical Medicine and Rehabilitation

## 2021-07-20 ENCOUNTER — Encounter: Payer: Self-pay | Admitting: Hematology

## 2021-07-22 ENCOUNTER — Other Ambulatory Visit (HOSPITAL_COMMUNITY): Payer: Self-pay

## 2021-07-23 ENCOUNTER — Emergency Department (HOSPITAL_COMMUNITY): Payer: Medicare Other

## 2021-07-23 ENCOUNTER — Inpatient Hospital Stay (HOSPITAL_COMMUNITY)
Admission: EM | Admit: 2021-07-23 | Discharge: 2021-07-27 | DRG: 871 | Disposition: A | Discharge status: Home Health | Payer: Medicare Other | Attending: Family Medicine | Admitting: Family Medicine

## 2021-07-23 ENCOUNTER — Other Ambulatory Visit: Payer: Self-pay

## 2021-07-23 DIAGNOSIS — Z833 Family history of diabetes mellitus: Secondary | ICD-10-CM

## 2021-07-23 DIAGNOSIS — R0902 Hypoxemia: Secondary | ICD-10-CM | POA: Diagnosis not present

## 2021-07-23 DIAGNOSIS — Z888 Allergy status to other drugs, medicaments and biological substances status: Secondary | ICD-10-CM

## 2021-07-23 DIAGNOSIS — R0602 Shortness of breath: Secondary | ICD-10-CM

## 2021-07-23 DIAGNOSIS — A419 Sepsis, unspecified organism: Secondary | ICD-10-CM | POA: Diagnosis present

## 2021-07-23 DIAGNOSIS — I130 Hypertensive heart and chronic kidney disease with heart failure and stage 1 through stage 4 chronic kidney disease, or unspecified chronic kidney disease: Secondary | ICD-10-CM | POA: Diagnosis present

## 2021-07-23 DIAGNOSIS — E876 Hypokalemia: Secondary | ICD-10-CM | POA: Diagnosis present

## 2021-07-23 DIAGNOSIS — Z86011 Personal history of benign neoplasm of the brain: Secondary | ICD-10-CM

## 2021-07-23 DIAGNOSIS — M109 Gout, unspecified: Secondary | ICD-10-CM | POA: Diagnosis present

## 2021-07-23 DIAGNOSIS — Z809 Family history of malignant neoplasm, unspecified: Secondary | ICD-10-CM

## 2021-07-23 DIAGNOSIS — E1151 Type 2 diabetes mellitus with diabetic peripheral angiopathy without gangrene: Secondary | ICD-10-CM | POA: Diagnosis present

## 2021-07-23 DIAGNOSIS — R0989 Other specified symptoms and signs involving the circulatory and respiratory systems: Secondary | ICD-10-CM

## 2021-07-23 DIAGNOSIS — J189 Pneumonia, unspecified organism: Secondary | ICD-10-CM | POA: Diagnosis not present

## 2021-07-23 DIAGNOSIS — Z823 Family history of stroke: Secondary | ICD-10-CM

## 2021-07-23 DIAGNOSIS — Z91018 Allergy to other foods: Secondary | ICD-10-CM

## 2021-07-23 DIAGNOSIS — Z8249 Family history of ischemic heart disease and other diseases of the circulatory system: Secondary | ICD-10-CM

## 2021-07-23 DIAGNOSIS — Z79899 Other long term (current) drug therapy: Secondary | ICD-10-CM

## 2021-07-23 DIAGNOSIS — A413 Sepsis due to Hemophilus influenzae: Principal | ICD-10-CM | POA: Diagnosis present

## 2021-07-23 DIAGNOSIS — E7800 Pure hypercholesterolemia, unspecified: Secondary | ICD-10-CM | POA: Diagnosis present

## 2021-07-23 DIAGNOSIS — G931 Anoxic brain damage, not elsewhere classified: Secondary | ICD-10-CM | POA: Diagnosis present

## 2021-07-23 DIAGNOSIS — Z7951 Long term (current) use of inhaled steroids: Secondary | ICD-10-CM

## 2021-07-23 DIAGNOSIS — Z7902 Long term (current) use of antithrombotics/antiplatelets: Secondary | ICD-10-CM

## 2021-07-23 DIAGNOSIS — F17210 Nicotine dependence, cigarettes, uncomplicated: Secondary | ICD-10-CM | POA: Diagnosis present

## 2021-07-23 DIAGNOSIS — R109 Unspecified abdominal pain: Secondary | ICD-10-CM

## 2021-07-23 DIAGNOSIS — N1831 Chronic kidney disease, stage 3a: Secondary | ICD-10-CM | POA: Diagnosis present

## 2021-07-23 DIAGNOSIS — Z7984 Long term (current) use of oral hypoglycemic drugs: Secondary | ICD-10-CM

## 2021-07-23 DIAGNOSIS — Z886 Allergy status to analgesic agent status: Secondary | ICD-10-CM

## 2021-07-23 DIAGNOSIS — E1122 Type 2 diabetes mellitus with diabetic chronic kidney disease: Secondary | ICD-10-CM | POA: Diagnosis present

## 2021-07-23 DIAGNOSIS — J9601 Acute respiratory failure with hypoxia: Secondary | ICD-10-CM | POA: Diagnosis present

## 2021-07-23 DIAGNOSIS — Z825 Family history of asthma and other chronic lower respiratory diseases: Secondary | ICD-10-CM

## 2021-07-23 DIAGNOSIS — C9110 Chronic lymphocytic leukemia of B-cell type not having achieved remission: Secondary | ICD-10-CM | POA: Diagnosis present

## 2021-07-23 DIAGNOSIS — I5032 Chronic diastolic (congestive) heart failure: Secondary | ICD-10-CM | POA: Diagnosis present

## 2021-07-23 DIAGNOSIS — K219 Gastro-esophageal reflux disease without esophagitis: Secondary | ICD-10-CM | POA: Diagnosis present

## 2021-07-23 DIAGNOSIS — G9340 Encephalopathy, unspecified: Secondary | ICD-10-CM | POA: Diagnosis not present

## 2021-07-23 DIAGNOSIS — Z83438 Family history of other disorder of lipoprotein metabolism and other lipidemia: Secondary | ICD-10-CM

## 2021-07-23 DIAGNOSIS — R6520 Severe sepsis without septic shock: Secondary | ICD-10-CM

## 2021-07-23 DIAGNOSIS — Z8673 Personal history of transient ischemic attack (TIA), and cerebral infarction without residual deficits: Secondary | ICD-10-CM

## 2021-07-23 DIAGNOSIS — J440 Chronic obstructive pulmonary disease with acute lower respiratory infection: Secondary | ICD-10-CM | POA: Diagnosis present

## 2021-07-23 DIAGNOSIS — Z20822 Contact with and (suspected) exposure to covid-19: Secondary | ICD-10-CM | POA: Diagnosis present

## 2021-07-23 LAB — COMPREHENSIVE METABOLIC PANEL
ALT: 38 U/L (ref 0–44)
AST: 41 U/L (ref 15–41)
Albumin: 3 g/dL — ABNORMAL LOW (ref 3.5–5.0)
Alkaline Phosphatase: 188 U/L — ABNORMAL HIGH (ref 38–126)
Anion gap: 10 (ref 5–15)
BUN: 15 mg/dL (ref 8–23)
CO2: 24 mmol/L (ref 22–32)
Calcium: 9.1 mg/dL (ref 8.9–10.3)
Chloride: 106 mmol/L (ref 98–111)
Creatinine, Ser: 1.76 mg/dL — ABNORMAL HIGH (ref 0.44–1.00)
GFR, Estimated: 30 mL/min — ABNORMAL LOW (ref >60)
Glucose, Bld: 111 mg/dL — ABNORMAL HIGH (ref 70–99)
Potassium: 3.2 mmol/L — ABNORMAL LOW (ref 3.5–5.1)
Sodium: 140 mmol/L (ref 135–145)
Total Bilirubin: 2.1 mg/dL — ABNORMAL HIGH (ref 0.3–1.2)
Total Protein: 6 g/dL — ABNORMAL LOW (ref 6.5–8.1)

## 2021-07-23 LAB — CBC WITH DIFFERENTIAL/PLATELET
Abs Immature Granulocytes: 0.56 10*3/uL — ABNORMAL HIGH (ref 0.00–0.07)
Basophils Absolute: 0.1 10*3/uL (ref 0.0–0.1)
Basophils Relative: 0 %
Eosinophils Absolute: 0.1 10*3/uL (ref 0.0–0.5)
Eosinophils Relative: 1 %
HCT: 37.3 % (ref 36.0–46.0)
Hemoglobin: 11.8 g/dL — ABNORMAL LOW (ref 12.0–15.0)
Immature Granulocytes: 3 %
Lymphocytes Relative: 7 %
Lymphs Abs: 1.3 10*3/uL (ref 0.7–4.0)
MCH: 27.8 pg (ref 26.0–34.0)
MCHC: 31.6 g/dL (ref 30.0–36.0)
MCV: 88 fL (ref 80.0–100.0)
Monocytes Absolute: 3.9 10*3/uL — ABNORMAL HIGH (ref 0.1–1.0)
Monocytes Relative: 19 %
Neutro Abs: 14.5 10*3/uL — ABNORMAL HIGH (ref 1.7–7.7)
Neutrophils Relative %: 70 %
Platelets: 192 10*3/uL (ref 150–400)
RBC: 4.24 MIL/uL (ref 3.87–5.11)
RDW: 19.3 % — ABNORMAL HIGH (ref 11.5–15.5)
Smear Review: NORMAL
WBC: 20.5 10*3/uL — ABNORMAL HIGH (ref 4.0–10.5)
nRBC: 0 % (ref 0.0–0.2)

## 2021-07-23 LAB — GLUCOSE, CAPILLARY
Glucose-Capillary: 115 mg/dL — ABNORMAL HIGH (ref 70–99)
Glucose-Capillary: 113 mg/dL — ABNORMAL HIGH (ref 70–99)

## 2021-07-23 LAB — APTT: aPTT: 33 seconds (ref 24–36)

## 2021-07-23 LAB — RESP PANEL BY RT-PCR (FLU A&B, COVID) ARPGX2
Influenza A by PCR: NEGATIVE
Influenza B by PCR: NEGATIVE
SARS Coronavirus 2 by RT PCR: NEGATIVE

## 2021-07-23 LAB — PROTIME-INR
INR: 1.3 — ABNORMAL HIGH (ref 0.8–1.2)
Prothrombin Time: 15.9 seconds — ABNORMAL HIGH (ref 11.4–15.2)

## 2021-07-23 LAB — LACTIC ACID, PLASMA
Lactic Acid, Venous: 1.5 mmol/L (ref 0.5–1.9)
Lactic Acid, Venous: 1.7 mmol/L (ref 0.5–1.9)

## 2021-07-23 LAB — TROPONIN I (HIGH SENSITIVITY)
Troponin I (High Sensitivity): 33 ng/L — ABNORMAL HIGH (ref <18)
Troponin I (High Sensitivity): 29 ng/L — ABNORMAL HIGH (ref <18)

## 2021-07-23 MED ORDER — ALBUTEROL SULFATE (2.5 MG/3ML) 0.083% IN NEBU
2.5000 mg | INHALATION_SOLUTION | Freq: Four times a day (QID) | RESPIRATORY_TRACT | Status: DC | PRN
  Administered 2021-07-26: 2.5 mg via RESPIRATORY_TRACT
  Filled 2021-07-23: qty 3

## 2021-07-23 MED ORDER — ALBUTEROL SULFATE 0.63 MG/3ML IN NEBU: 1.0000 | INHALATION_SOLUTION | Freq: Four times a day (QID) | RESPIRATORY_TRACT | Status: DC | PRN

## 2021-07-23 MED ORDER — GABAPENTIN 300 MG PO CAPS: 600.0000 mg | ORAL_CAPSULE | Freq: Two times a day (BID) | ORAL | Status: DC

## 2021-07-23 MED ORDER — ACETAMINOPHEN 650 MG RE SUPP
650.0000 mg | Freq: Once | RECTAL | Status: AC
  Administered 2021-07-23: 650 mg via RECTAL
  Filled 2021-07-23: qty 1

## 2021-07-23 MED ORDER — SODIUM CHLORIDE 0.9 % IV SOLN
2.0000 g | Freq: Once | INTRAVENOUS | Status: AC
  Administered 2021-07-23: 2 g via INTRAVENOUS
  Filled 2021-07-23: qty 2

## 2021-07-23 MED ORDER — DICLOFENAC SODIUM 1 % EX GEL
2.0000 g | Freq: Four times a day (QID) | CUTANEOUS | Status: DC | PRN
  Administered 2021-07-24 (×2): 2 g via TOPICAL
  Filled 2021-07-23: qty 100

## 2021-07-23 MED ORDER — CLOPIDOGREL BISULFATE 75 MG PO TABS
75.0000 mg | ORAL_TABLET | Freq: Every day | ORAL | Status: DC
  Administered 2021-07-24 – 2021-07-27 (×4): 75 mg via ORAL
  Filled 2021-07-23 (×4): qty 1

## 2021-07-23 MED ORDER — FLUTICASONE FUROATE-VILANTEROL 100-25 MCG/INH IN AEPB
1.0000 | INHALATION_SPRAY | Freq: Every day | RESPIRATORY_TRACT | Status: DC
  Administered 2021-07-24 – 2021-07-27 (×4): 1 via RESPIRATORY_TRACT
  Filled 2021-07-23: qty 28

## 2021-07-23 MED ORDER — FUROSEMIDE 20 MG PO TABS: 20.0000 mg | ORAL_TABLET | Freq: Every day | ORAL | Status: DC

## 2021-07-23 MED ORDER — SODIUM CHLORIDE 0.9 % IV BOLUS (SEPSIS)
1000.0000 mL | Freq: Once | INTRAVENOUS | Status: AC
  Administered 2021-07-23: 1000 mL via INTRAVENOUS

## 2021-07-23 MED ORDER — VANCOMYCIN HCL 1250 MG/250ML IV SOLN
1250.0000 mg | Freq: Once | INTRAVENOUS | Status: AC
  Administered 2021-07-23: 1250 mg via INTRAVENOUS
  Filled 2021-07-23: qty 250

## 2021-07-23 MED ORDER — AMLODIPINE BESYLATE 5 MG PO TABS
5.0000 mg | ORAL_TABLET | Freq: Every day | ORAL | Status: DC
  Administered 2021-07-23 – 2021-07-27 (×5): 5 mg via ORAL
  Filled 2021-07-23 (×5): qty 1

## 2021-07-23 MED ORDER — METRONIDAZOLE 500 MG/100ML IV SOLN
500.0000 mg | Freq: Once | INTRAVENOUS | Status: AC
  Administered 2021-07-23: 500 mg via INTRAVENOUS
  Filled 2021-07-23: qty 100

## 2021-07-23 MED ORDER — ALLOPURINOL 300 MG PO TABS
150.0000 mg | ORAL_TABLET | Freq: Two times a day (BID) | ORAL | Status: DC
  Administered 2021-07-24 – 2021-07-27 (×8): 150 mg via ORAL
  Filled 2021-07-23 (×8): qty 1

## 2021-07-23 MED ORDER — SODIUM CHLORIDE 0.9 % IV SOLN: 2.0000 g | INTRAVENOUS | Status: DC

## 2021-07-23 MED ORDER — B COMPLEX VITAMINS PO CAPS: 1.0000 | ORAL_CAPSULE | Freq: Every day | ORAL | Status: DC

## 2021-07-23 MED ORDER — LACTATED RINGERS IV SOLN: INTRAVENOUS | Status: DC

## 2021-07-23 MED ORDER — ENOXAPARIN SODIUM 30 MG/0.3ML IJ SOSY
30.0000 mg | PREFILLED_SYRINGE | INTRAMUSCULAR | Status: DC
  Administered 2021-07-24: 30 mg via SUBCUTANEOUS
  Filled 2021-07-23: qty 0.3

## 2021-07-23 MED ORDER — TRAMADOL HCL 50 MG PO TABS
50.0000 mg | ORAL_TABLET | Freq: Two times a day (BID) | ORAL | Status: DC | PRN
  Administered 2021-07-24 – 2021-07-26 (×3): 50 mg via ORAL
  Filled 2021-07-23 (×4): qty 1

## 2021-07-23 MED ORDER — ACETAMINOPHEN 325 MG PO TABS
650.0000 mg | ORAL_TABLET | Freq: Four times a day (QID) | ORAL | Status: DC | PRN
  Administered 2021-07-23 – 2021-07-27 (×3): 650 mg via ORAL
  Filled 2021-07-23 (×3): qty 2

## 2021-07-23 MED ORDER — VANCOMYCIN HCL 1250 MG/250ML IV SOLN
1250.0000 mg | Freq: Once | INTRAVENOUS | Status: DC
  Filled 2021-07-23: qty 250

## 2021-07-23 MED ORDER — POTASSIUM CHLORIDE CRYS ER 20 MEQ PO TBCR
40.0000 meq | EXTENDED_RELEASE_TABLET | Freq: Once | ORAL | Status: AC
  Administered 2021-07-23: 40 meq via ORAL
  Filled 2021-07-23: qty 2

## 2021-07-23 MED ORDER — DULOXETINE HCL 30 MG PO CPEP
30.0000 mg | ORAL_CAPSULE | Freq: Every day | ORAL | Status: DC
  Administered 2021-07-23 – 2021-07-27 (×5): 30 mg via ORAL
  Filled 2021-07-23 (×5): qty 1

## 2021-07-23 MED ORDER — ACETAMINOPHEN 650 MG RE SUPP: 325.0000 mg | Freq: Four times a day (QID) | RECTAL | Status: DC | PRN

## 2021-07-23 MED ORDER — TRAMADOL HCL 50 MG PO TABS
50.0000 mg | ORAL_TABLET | Freq: Once | ORAL | Status: DC
Start: 2021-07-23 — End: 2021-07-24

## 2021-07-23 MED ORDER — POLYSACCHARIDE IRON COMPLEX 150 MG PO CAPS
150.0000 mg | ORAL_CAPSULE | Freq: Every day | ORAL | Status: DC
  Administered 2021-07-24 – 2021-07-27 (×4): 150 mg via ORAL
  Filled 2021-07-23 (×4): qty 1

## 2021-07-23 MED ORDER — MONTELUKAST SODIUM 10 MG PO TABS
10.0000 mg | ORAL_TABLET | Freq: Every day | ORAL | Status: DC
  Administered 2021-07-24 – 2021-07-26 (×4): 10 mg via ORAL
  Filled 2021-07-23 (×4): qty 1

## 2021-07-23 MED ORDER — FAMOTIDINE 20 MG PO TABS
20.0000 mg | ORAL_TABLET | Freq: Two times a day (BID) | ORAL | Status: DC
  Administered 2021-07-24 – 2021-07-27 (×8): 20 mg via ORAL
  Filled 2021-07-23 (×8): qty 1

## 2021-07-23 MED ORDER — LIDOCAINE 5 % EX OINT
1.0000 "application " | TOPICAL_OINTMENT | CUTANEOUS | Status: DC | PRN
  Administered 2021-07-24: 1 via TOPICAL
  Filled 2021-07-23: qty 35.44

## 2021-07-23 MED ORDER — B COMPLEX-C PO TABS
1.0000 | ORAL_TABLET | Freq: Every day | ORAL | Status: DC
  Administered 2021-07-24 – 2021-07-27 (×4): 1 via ORAL
  Filled 2021-07-23 (×4): qty 1

## 2021-07-23 MED ORDER — INSULIN ASPART 100 UNIT/ML IJ SOLN
0.0000 [IU] | Freq: Three times a day (TID) | INTRAMUSCULAR | Status: DC
  Administered 2021-07-25: 1 [IU] via SUBCUTANEOUS
  Administered 2021-07-26 (×2): 2 [IU] via SUBCUTANEOUS
  Administered 2021-07-26: 1 [IU] via SUBCUTANEOUS
  Administered 2021-07-27: 2 [IU] via SUBCUTANEOUS
  Administered 2021-07-27: 3 [IU] via SUBCUTANEOUS
  Administered 2021-07-27: 2 [IU] via SUBCUTANEOUS

## 2021-07-23 MED ORDER — ALBUTEROL SULFATE HFA 108 (90 BASE) MCG/ACT IN AERS: 2.0000 | INHALATION_SPRAY | Freq: Four times a day (QID) | RESPIRATORY_TRACT | Status: DC | PRN

## 2021-07-23 MED ORDER — EMPAGLIFLOZIN 25 MG PO TABS: 25.0000 mg | ORAL_TABLET | Freq: Every day | ORAL | Status: DC

## 2021-07-23 MED ORDER — HYDRALAZINE HCL 25 MG PO TABS
50.0000 mg | ORAL_TABLET | Freq: Three times a day (TID) | ORAL | Status: DC
  Administered 2021-07-24 – 2021-07-27 (×12): 50 mg via ORAL
  Filled 2021-07-23 (×12): qty 2

## 2021-07-23 MED ORDER — VENETOCLAX 100 MG PO TABS: 200.0000 mg | ORAL_TABLET | Freq: Every day | ORAL | Status: DC

## 2021-07-23 MED ORDER — VANCOMYCIN HCL 500 MG/100ML IV SOLN: 500.0000 mg | INTRAVENOUS | Status: DC

## 2021-07-23 MED ORDER — ATORVASTATIN CALCIUM 40 MG PO TABS
80.0000 mg | ORAL_TABLET | Freq: Every day | ORAL | Status: DC
  Administered 2021-07-24 – 2021-07-27 (×4): 80 mg via ORAL
  Filled 2021-07-23 (×4): qty 2

## 2021-07-23 NOTE — Hospital Course (Addendum)
[name] [name] is a 75 year old female diagnosed with CAP and H. influenzae bacteremia.  Past medical history of CLL (currently on chemo), meningioma s/p craniotomy and resection, CVA, COPD, T2DM, HTN, HLD, PAD s/p femoropopliteal bypass and femoral endarterectomy.   Sepsis and altered mental status Secondary to acute hypoxic respiratory distress Due to community-acquired pneumonia and bacteremia On arrival to the ED was febrile to 103.4, tachycardic to 106, tachypneic to 26, hypoxic to 89%.  Her lactic acid was 1.5 and WBC is 20.5.  She was moaning and responsive only to pain.  SPO2 improved with 4 L O2 via [place].  Chest x-ray showed dense airspace opacity within the right upper lobe extending into the right perihilar region concerning for pneumonia as well as bibasilar opacities.  She improved rapidly with administration of fluids and supplemental oxygen.  She was started on vancomycin, cefepime, Flagyl.  CT head negative for acute hemorrhage or infarction. She was transitioned to ceftriaxone on 7/29 and discharged on cefdinir on 8/1 to finish a total 10 day course at home. DG Chest 1 view the day of discharge noted persistent dense airspace consolidation in the right upper lobe with interval improvement in bibasilar aeration.  Abdominal pain Patient with epigastric tenderness on admission, thought to be pleuritic in nature secondary to large pneumonia.  On hospital day #2, pain worsened.  Acute abdominal x-ray series without evidence of bowel perforation.  CT abdomen pelvis with contrast also negative for acute intra-abdominal pathology.  Patient pain improved by hospital day #3.  Hypertension Persistently hypertensive on admission with systolic BP [id number]. She was continued on her home BP medication. Asymptomatic.   Antibiotic summary Cefepime 7/28 Ceftriaxone 7/29-7/31 Metronidazole 7/28 Vancomycin 7/28 Cefdinir 8/1-8/6  Discharge recommendations Chemotherapy stopped during admission after  conversation with her oncologist. Per onc, will need to hold chemo for a couple weeks after hospital; discharge. Recommend outpatient follow up with oncology.  Completion of Cefdinir on 8/6 Consider BP medication adjustment as necessary but keeping in mind that she is chronically ill and given her age, wouldn't want to have too strict control. BP systolic goal while inpatient was 160.

## 2021-07-23 NOTE — Progress Notes (Addendum)
Pharmacy Antibiotic Note  [name] [name] is a 75 y.o. female admitted on 07/23/2021 with  sepsis 2/2 unknown source .  Pharmacy has been consulted for vancomycin and cefepime dosing.  Plan: Vancomycin '1250mg'$  x1 then '500mg'$  IV q24h (eAUC 462, Cr 1.76) Cefepime 2g IV q24h -Monitor renal function, clinical status, and antibiotic plan   Estimated Creatinine Clearance: 27.2 mL/min (A) (by C-G formula based on SCr of 1.63 mg/dL (H)).    Allergies  Allergen Reactions   Gazyva [Obinutuzumab] Shortness Of Breath    Acute respiratory distress   Aspirin     Abdominal pain.    No Healthtouch Food Allergies Diarrhea and Nausea And Vomiting    Cabbage, stomach pain    Aspirin Other (See Comments)    irritates stomach   Baclofen Other (See Comments)    Stomach irritation   Antimicrobials this admission: Cefepime 7/28 >>  Vanc 7/28 >>  Flagyl x1  Dose adjustments this admission: N/A  Microbiology results: 7/28 BCx:  7/28 UCx:   Thank you for allowing pharmacy to be a part of this patient's care.  Joetta Manners, PharmD, [facility] Emergency Medicine Clinical Pharmacist ED RPh Phone: [place]: [phone]

## 2021-07-23 NOTE — Plan of Care (Signed)
Pt admitted from ED for ams and pna. Management of previous and d/t sepsis, chest pain and  hypertension. Currently on nitro gtt. PT is neuro intact BP stable. RA w/ adequate oxygenation. Tolerating PO diet well. Oliguric. Plans for HD tomorrow. IV RA patent. Plan for IV antibiotics.   Problem: Activity: Goal: Ability to return to baseline activity level will improve Outcome: Progressing   Problem: Health Behavior/Discharge Planning: Goal: Ability to identify and utilize available resources and services will improve Outcome: Progressing Goal: Ability to manage health-related needs will improve Outcome: Progressing   Problem: Metabolic: Goal: Ability to maintain appropriate glucose levels will improve Outcome: Progressing

## 2021-07-23 NOTE — ED Notes (Signed)
Attempted report X1

## 2021-07-23 NOTE — ED Notes (Signed)
Patient transported to CT 

## 2021-07-23 NOTE — ED Notes (Signed)
[name] son [phone] would like an update on the patient

## 2021-07-23 NOTE — ED Triage Notes (Signed)
Pt from home with AMS. Began 07/22/21 at 1600. Pt began acting strange with family. Stood up and urinated on self. Fever 102. Pt only responding to pain  150/80 HR 120 RR 18

## 2021-07-23 NOTE — H&P (Addendum)
[facility] Admission History and Physical Service Pager: [phone]  Patient name: [name] [name] Medical record number: [id number] Date of birth: [birth year] Age: 75 y.o. Gender: female  Primary Care Provider: Delora Fuel, MD Consultants: None Code Status: YES to CPR, NO to intubation Preferred Emergency Contact: Husband [name] [name] [phone] She states she would like her husband [name] [name] to be the one to make medical decisions on her behalf if she were unable to do so.   Chief Complaint: Altered mental status, fever  Assessment and Plan: [name] [name] is a 75 y.o. female presenting with altered mental status and fever. PMH is significant for CLL, meningioma s/p craniotomy and resection, CVA, COPD, diabetes, hypertension, hyperlipidemia, peripheral vascular disease with a history of femoropopliteal bypass and femoral endarterectomy.  Altered Mental Status  Fever  Acute hypoxic respiratory failure  Met sepsis criteria on admission.  Per husband at bedside, patient brought to ED by family for AMS since 4 PM yesterday and fever to 102 F at home.  On arrival to the ED was moaning, only responsive only to pain. Febrile to 103.4,  tachycardic to 106, tachypneic to 26, hypoxic to 89%.  Lactic acid 1.5, WBC 20.5, COVID-negative.  Blood and urine cultures obtained prior to initiation of antibiotics.  CXR concerning for pneumonia and RUL extending to right perihilar region; also demonstrated bibasilar opacities concerning for atelectasis versus multifocal pneumonia. COVID-negative.  CT head negative for ICH or acute demarcated cortical infarction; did show stable sequela from left parietal craniotomy for meningioma resection. During exam patient had repeating fits of coughing productive of bloody sputum and desatted to low 80s with recovery to mid 90s on 4L. Differential for her presentation includes sepsis secondary to pneumonia versus acute CVA versus sepsis from  other source. CVA less likely given no acute findings on CT, normal neuro exam, no family reports of facial droop, slurring of speech, numbness/weakness, ataxia.  Infection other than pneumonia unlikely given no wounds, urinary symptoms, and temporal association between onset of respiratory symptoms and mental status changes. -Admit to progressive, [name] [name] attending -Continue vancomycin, cefepime, metronidazole -Follow blood and urine cultures -Follow-up repeat lactic acid -Continuous pulse ox -Cardiac monitoring given history -Supplemental O2 as needed, O2 goal greater than 88% (history of COPD) -Chest physiotherapy every 4 hours while awake -Incentive spirometry every hour while awake  Substernal Chest Pain Patient is complaining of substernal chest pain throughout exam.  She localizes it to the [name] of her chest and reports worse with coughing or deep inspiration.  She says that this pain has been progressively worsening over the past 24 hours.  Chest wall is diffusely tender to palpation.  Pain is also reproducible with palpation of upper abdomen.  Likely that this is secondary to her pneumonia but given her history of peripheral artery disease ACS remains on the differential.  EKG on admission without evidence of ischemia. -We will check troponins -Tylenol and home tramadol 50 mg for chest pain  Chronic lymphocytic leukemia Follows with Dr. Irene [name] hematology/oncology.  Has been on increased dose of venetoclax since mid May.  No cervical lymphadenopathy.  Unable to assess for splenomegaly as patient cannot tolerate lying supine at this time. -Continue 200 mg venetoclax -Continue p.o. iron and B complex per heme-onc  HFpEF  Hypertension BPs in the ED have been running 150s/70s.  Most recent echo in May 2021 with LVEF 70 to 75%, concentric LVH, indeterminate diastolic parameters.  Home medications include Lasix 20 mg daily, amlodipine  5 mg daily, hydralazine 50 mg 3 times daily.   Hypo-volemic on exam, do not believe that her heart failure is contributing to her presentation at this time. -Continue home Lasix 20 mg daily -Continue home amlodipine 5 mg daily -Continue hydralazine 50 mg 3 times daily  COPD No wheezing on my exam today.  Home medications include Breo Ellipta, Singulair, albuterol. -Supplemental O2 to maintain sats greater than 88% -Continue home Breo -Continue home Singulair -Albuterol as needed  CKD 3a  Hypokalemia Creatinine today 1.76 up slightly from her baseline of about 1.6.  BUN 15. K3.2. -Avoid nephrotoxic meds -KCl 40 mEq PO  Diabetes On metformin, Jardiance, Trulicity at home. -Hold Trulicity and [place] while in hospital -CBGs, SSI  Gout No evidence of active flare - Continue home allopurinol  GERD -continue home famotidine  FEN/GI: NPO until bed side swallow Prophylaxis: Reduce dosed Lovenox  Disposition: progressive   History of Present Illness:  [name] [name] is a 75 y.o. female presenting with altered mental status and fevers.  Her husband [name] [name] is at the bedside and helps to give the history.  Her family members first noticed that she was acting strangely last night around 4 PM.  She has reportedly had a cough for the last few days that has been mildly productive of nonbloody phlegm at home.  Otherwise has been acting like herself.  The first unusual act that her family members noticed yesterday was that around 4 PM she stood up and urinated on herself.  Last night they noted her to be febrile to 102 and said that she has been generally confused.  This morning she became less interactive and would not respond or answer her family members questions. She has a history of COPD but does not use oxygen at home.  At baseline she is interactive and mentating clearly.  She ambulates with a walker without issue.  She denies any recent travel or sick contacts.  She denies any shortness of breath, nausea, vomiting, abdominal pain  prior to this episode.  Review Of Systems: Per HPI with the following additions:   Review of Systems  Constitutional:  Positive for activity change and fever.  Respiratory:  Positive for cough and shortness of breath. Negative for wheezing.   Cardiovascular:  Positive for chest pain.  Gastrointestinal:  Negative for abdominal pain, nausea and vomiting.  Neurological:  Negative for dizziness, syncope, speech difficulty, weakness, numbness and headaches.  Psychiatric/Behavioral:  Positive for confusion.     Patient Active Problem List   Diagnosis Date Noted   Sepsis ([place]) 07/23/2021   Fall 05/04/2021   Finger pain, right 05/04/2021   Counseling regarding advance care planning and goals of care 02/19/2021   Iron deficiency anemia 02/12/2021   Peripheral arterial disease ([place]) 10/03/2020   (HFpEF) heart failure with preserved ejection fraction ([place]) 06/11/2020   Venous insufficiency (chronic) (peripheral) 06/22/2019   Pulmonary nodules 01/04/2018   Mass of left lower leg 01/04/2018   Chronic kidney disease (CKD), stage III (moderate) ([place]) 06/04/2016   Chronic back pain 10/02/2015   DM (diabetes mellitus), type 2 with peripheral vascular complications ([place]) 45/80/9983   Chronic lymphocytic leukemia ([place]) 04/11/2009   PERIPHERAL VASCULAR DISEASE 10/13/2007   HYPERCHOLESTEROLEMIA 02/23/2007   ANXIETY 02/23/2007   TOBACCO DEPENDENCE 02/23/2007   Essential hypertension 02/23/2007   COPD (chronic obstructive pulmonary disease) ([place]) 02/23/2007   GASTROESOPHAGEAL REFLUX, NO ESOPHAGITIS 02/23/2007    Past Medical History: Past Medical History:  Diagnosis Date   Allergy  environmental   Asthma    Brain tumor ([place])    CKD (chronic kidney disease)    CLL (chronic lymphoblastic leukemia)    CLL (chronic lymphocytic leukemia) (H[facility])    Constipation 11/15/2011   COPD (chronic obstructive pulmonary disease) ([facility])    CVA (cerebral vascular accident) ([place])    Diabetes ([place])     Diabetes mellitus without complication (H[facility])    GERD (gastroesophageal reflux disease)    Headache    migraines   Heart murmur    Hyperlipidemia    Hypertension    Leukemia ([place])    PVD (peripheral vascular disease) ([place])    Stroke ([place]) 1998   in 1998 due to tumor-right side    Past Surgical History: Past Surgical History:  Procedure Laterality Date   ABDOMINAL AORTOGRAM W/LOWER EXTREMITY Bilateral 08/12/2020   Procedure: ABDOMINAL AORTOGRAM W/BILATERAL LOWER EXTREMITY RUNOFF;  Surgeon: Serafina Mitchell, MD;  Location: [facility];  Service: Cardiovascular;  Laterality: Bilateral;   ABI  02/2012   ABI <0.65 BL 02/2012   BRAIN MENINGIOMA EXCISION     BRAIN TUMOR EXCISION     Bypass grafting of RLE for PAD claudication      CARDIAC CATHETERIZATION  04/09/208   CESAREAN SECTION     1974, 78 ,80   CHOLECYSTECTOMY, LAPAROSCOPIC     COLONOSCOPY  2014   [place], [place]   ENDARTERECTOMY FEMORAL Right 10/03/2020   Procedure: RIGHT FEMORAL ENDARTERECTOMY WITH VEIN PATCH ANGIOPLASTY;  Surgeon: Serafina Mitchell, MD;  Location: [facility];  Service: Vascular;  Laterality: Right;   ESOPHAGEAL DILATION     FEMORAL-POPLITEAL BYPASS GRAFT Right 10/03/2020   Procedure: RIGHT FEMORAL- BELOW KNEE POPLITEAL BYPASS USING GORE PROPATEN 6MM GRAFT;  Surgeon: Serafina Mitchell, MD;  Location: [facility];  Service: Vascular;  Laterality: Right;   PATCH ANGIOPLASTY  10/03/2020   Procedure: VEIN PATCH ANGIOPLASTY;  Surgeon: Serafina Mitchell, MD;  Location: [facility];  Service: Vascular;;    Social History: Social History   Tobacco Use   Smoking status: Every Day    Packs/day: 0.25    Years: 30.00    Pack years: 7.50    Types: Cigarettes    Start date: 01/01/1976   Smokeless tobacco: Never   Tobacco comments:    ~ 3 cigarettes / day  Vaping Use   Vaping Use: Never used  Substance Use Topics   Alcohol use: Not Currently   Drug use: Not Currently   Additional social history:   Tobacco Use - 1/2  PPD  Lots of coffee  No alcohol, no illicit substances, no prescription abuse  Please also refer to relevant sections of EMR.  Family History: Family History  Problem Relation Age of Onset   Heart disease Mother    Asthma Mother    Cancer Mother        uterine    Depression Mother    Heart attack Mother 75   Hyperlipidemia Mother    Hypertension Mother    Stroke Mother    Kidney disease Mother    Heart attack Sister    Stroke Sister    Depression Sister    Diabetes Sister    Hyperlipidemia Sister    Depression Sister    Diabetes Sister    Hyperlipidemia Sister    HIV/AIDS Sister    Diabetes Brother    Asthma Brother    Hypertension Brother    Cancer Maternal Aunt        lung  Heart disease Maternal Grandmother    Heart attack Maternal Grandmother    Cancer Brother        colon   HIV/AIDS Brother    Colon cancer Neg Hx     Allergies and Medications: Allergies  Allergen Reactions   Gazyva [Obinutuzumab] Shortness Of Breath    Acute respiratory distress   Aspirin     Abdominal pain.    No [facility] Food Allergies Diarrhea and Nausea And Vomiting    Cabbage, stomach pain    Aspirin Other (See Comments)    irritates stomach   Baclofen Other (See Comments)    Stomach irritation   No current facility-administered medications on file prior to encounter.   Current Outpatient Medications on File Prior to Encounter  Medication Sig Dispense Refill   acetaminophen (TYLENOL) 500 MG tablet Take 500-1,000 mg by mouth every 6 (six) hours as needed for moderate pain.      albuterol (ACCUNEB) 0.63 MG/3ML nebulizer solution Inhale 1 vial via nebulizer every 6 hours for wheezing as needed (Patient taking differently: Take 1 ampule by nebulization every 6 (six) hours as needed for wheezing or shortness of breath.) 75 mL 11   albuterol (VENTOLIN HFA) 108 (90 Base) MCG/ACT inhaler Inhale 2 puffs into the lungs every 6 (six) hours as needed for wheezing or shortness of  breath. 8.5 each 11   allopurinol (ZYLOPRIM) 300 MG tablet Take 1/2 (one-half) tablet by mouth twice daily (Patient taking differently: Take 150 mg by mouth 2 (two) times daily.) 30 tablet 0   amLODipine (NORVASC) 5 MG tablet Take 5 mg by mouth daily.     atorvastatin (LIPITOR) 80 MG tablet Take 1 tablet by mouth every day 30 tablet 6   b complex vitamins capsule Take 1 capsule by mouth daily. 30 capsule 5   BREO ELLIPTA 100-25 MCG/INH AEPB Inhale 1 puff into the lungs daily.     clopidogrel (PLAVIX) 75 MG tablet Take 75 mg by mouth daily.     cyclobenzaprine (FLEXERIL) 10 MG tablet Take 1 tablet (10 mg total) by mouth 3 (three) times daily as needed for muscle spasms. 90 tablet 2   diclofenac Sodium (VOLTAREN) 1 % GEL Apply 2 grams topically four times a day 100 g 11   Dulaglutide (TRULICITY) 9.93 ZJ/6.9CV SOPN Inject 0.75 mg into the skin once a week.     DULoxetine (CYMBALTA) 30 MG capsule Take 1 capsule (30 mg total) by mouth daily. 90 capsule 1   famotidine (PEPCID) 20 MG tablet Take 20 mg by mouth 2 (two) times daily.     furosemide (LASIX) 20 MG tablet Take 1 tablet (20 mg total) by mouth daily. 30 tablet 3   gabapentin (NEURONTIN) 300 MG capsule Take 600 mg by mouth 2 (two) times daily.     hydrALAZINE (APRESOLINE) 50 MG tablet Take 1 tablet (50 mg total) by mouth 3 (three) times daily. 90 tablet 2   iron polysaccharides (NIFEREX) 150 MG capsule Take 1 capsule (150 mg total) by mouth daily. 30 capsule 5   JARDIANCE 25 MG TABS tablet Take 25 mg by mouth daily.     metFORMIN (GLUCOPHAGE) 500 MG tablet Take 1 tablet (500 mg total) by mouth daily. 90 tablet 0   montelukast (SINGULAIR) 10 MG tablet Take 1 tablet (10 mg total) by mouth at bedtime. 30 tablet 11   nitroGLYCERIN (NITROSTAT) 0.4 MG SL tablet Dissolve 1 tablet under tongue every 5 minutes, up to 3 doses for chest pain 25  tablet 11   traMADol (ULTRAM) 50 MG tablet Take 1 tablet 2 times a day as needed.  Will prescribe 1 months  supply.  Please see me in clinic in the next month for further refills. (Patient taking differently: Take 50 mg by mouth 2 (two) times daily as needed for moderate pain. Will prescribe 1 months supply.  Please see me in clinic in the next month for further refills.) 60 tablet 0   venetoclax (VENCLEXTA) 100 MG tablet Take 2 tablets (200 mg total) by mouth daily. Tablets should be swallowed whole with a meal and a full glass of water. 60 tablet 2   Alcohol Swabs (ALCOHOL PADS) 70 % PADS SMARTSIG:[place](s) Topical Daily     Blood Glucose Calibration (OT ULTRA/FASTTK CNTRL SOLN) SOLN See admin instructions.     Blood Glucose Monitoring Suppl (ONETOUCH VERIO) w/Device KIT 1 Device by Does not apply route daily. 1 kit 0   glucose blood (ONETOUCH VERIO) test strip Use to check blood sugar once a day 100 each 12   Lancet Devices (EASY MINI EJECT LANCING DEVICE) MISC daily.     Lancets (ONETOUCH ULTRASOFT) lancets Use to check blood sugar once a day 100 each 12   lidocaine (XYLOCAINE) 5 % ointment Apply 1 application topically as needed. Apply to R>L feet when burning up to 4x/day as needed 50 g 3   [DISCONTINUED] prochlorperazine (COMPAZINE) 10 MG tablet Take 1 tablet (10 mg total) by mouth every 6 (six) hours as needed (Nausea or vomiting). 30 tablet 1    Objective: BP (!) 142/75 (BP Location: Left Arm)   Pulse 85   Temp (!) 100.5 F (38.1 C) (Axillary)   Resp (!) 26   SpO2 97%  Exam: General: Uncomfortable appearing older female lying right lateral decubitus Eyes: Mild crusting of eyelashes, pupils PERRLA ENTM: Mucous membranes dry, mild nasal congestion Neck: Supple, no frank lymphadenopathy Cardiovascular: Tachycardic, regular S1/S2 no murmur. Respiratory: Productive cough, rhonchi throughout right lung field, decreased sounds in right lung base, left lung field clear to auscultation, increased work of breathing on 4 L nasal cannula Gastrointestinal: Abdomen tender to palpation, worse in upper  quadrants R >L.  Bowel sounds normal. MSK: Right lower extremity more edematous compared to left, multiple surgical scars from previous vascular procedures.  Left lower extremity tender to palpation.  Faint distal pulses on left, exquisite TTP over sternum Derm: Dry, no rashes noted Neuro: A&O x4, CN II-XII intact, strength and sensation intact Psych: Mood appropriate, flat affect, responds appropriately to direct questions however is derailed by saying "my chest it hurts" over and over  Labs and Imaging: CBC BMET  Recent Labs  Lab 07/23/21 1153  WBC 20.5*  HGB 11.8*  HCT 37.3  PLT 192   Recent Labs  Lab 07/23/21 1153  NA 140  K 3.2*  CL 106  CO2 24  BUN 15  CREATININE 1.76*  GLUCOSE 111*  CALCIUM 9.1     EKG: Sinus tachycardia at 105, QTc 450, no ST elevation, bifid P waves noted in lead II  Chest x-ray  Dense airspace opacity within the right upper lobe extending to the right perihilar region, concerning for pneumonia.  Left greater than right bibasilar opacities which, per radiology read, may reflect atelectasis versus multifocal pneumonia.  CT Head  No acute intracranial hemorrhage or cortical infarction. Redemonstrated sequela of prior left parietal craniotomy for meningioma resection, with encephalomalacia, gliosis in the underlying high left frontoparietal lobes.  A small amount of dural  thickening anterior to the resection site, presumably represents residual meningioma, has not appreciably changed compared to the head CT on 05/24. Mild chronic small vessel ischemic changes within the cerebral white matter Mild mucosal thickening within the bilateral frontal, ethmoid, sphenoid sinuses.  2 cm left maxillary sinus mucous retention cyst.     Eppie Gibson, MD 07/23/2021, 3:25 PM PGY-1, [facility] pager: [phone], text pages welcome  Upper Level Addendum: I have seen and evaluated this patient along with Dr. Joelyn Oms and reviewed the  above note, making necessary revisions as appropriate. These are denoted by green text. I agree with the medical decision making and physical exam as noted above. Ezequiel Essex, MD PGY-2 [name]

## 2021-07-23 NOTE — ED Provider Notes (Addendum)
[facility] EMERGENCY DEPARTMENT Provider Note   CSN: [id number] Arrival date & time: 07/23/21  1106     History Chief Complaint  Patient presents with   Altered Mental Status    [name] [name] is a 75 y.o. female.  HPI     75 year old female with a history of CLL, brain tumor, CVA, diabetes, hypertension, hyperlipidemia, peripheral vascular disease with history of femoropopliteal bypass and femoral endarterectomy, presents with concern for altered mental status from home.  They report that she began acting strangely last night around 4 PM.  Had a fever to 102.  Mother reports that she has had a cough over the last few days, and that she has been coughing up some phlegm.  She had otherwise been in her normal state of health, walking, drinking, until yesterday.  Around 4 PM yesterday, she began acting strangely.  She stood up and urinated on herself.  They noted she had fever last night.  She was acting confused and not herself, but was still able to state that she was tired and going to bed.  This morning, she answered a yes question but otherwise stopped responding or answering questions.  She is not following commands.  She appeared severely confused.  Son denies her having any preceding shortness of breath, nausea, vomiting, abdominal pain, headache.  Reports only symptoms a cough.  History limited by mental status change.  Past Medical History:  Diagnosis Date   Allergy    environmental   Asthma    Brain tumor ([place])    CKD (chronic kidney disease)    CLL (chronic lymphoblastic leukemia)    CLL (chronic lymphocytic leukemia) (HCC)    Constipation 11/15/2011   COPD (chronic obstructive pulmonary disease) (HCC)    CVA (cerebral vascular accident) ([place])    Diabetes ([place])    Diabetes mellitus without complication (HCC)    GERD (gastroesophageal reflux disease)    Headache    migraines   Heart murmur    Hyperlipidemia    Hypertension    Leukemia ([place])     PVD (peripheral vascular disease) ([place])    Stroke ([place]) 1998   in 1998 due to tumor-right side    Patient Active Problem List   Diagnosis Date Noted   Sepsis ([place]) 07/23/2021   Community acquired pneumonia of right lung    Hypoxia    Fall 05/04/2021   Finger pain, right 05/04/2021   Counseling regarding advance care planning and goals of care 02/19/2021   Iron deficiency anemia 02/12/2021   Peripheral arterial disease ([place]) 10/03/2020   (HFpEF) heart failure with preserved ejection fraction ([place]) 06/11/2020   Venous insufficiency (chronic) (peripheral) 06/22/2019   Pulmonary nodules 01/04/2018   Mass of left lower leg 01/04/2018   Chronic kidney disease (CKD), stage III (moderate) ([place]) 06/04/2016   Chronic back pain 10/02/2015   DM (diabetes mellitus), type 2 with peripheral vascular complications ([name]) 32/20/2542   Chronic lymphocytic leukemia ([place]) 04/11/2009   PERIPHERAL VASCULAR DISEASE 10/13/2007   HYPERCHOLESTEROLEMIA 02/23/2007   ANXIETY 02/23/2007   TOBACCO DEPENDENCE 02/23/2007   Essential hypertension 02/23/2007   COPD (chronic obstructive pulmonary disease) ([place]) 02/23/2007   GASTROESOPHAGEAL REFLUX, NO ESOPHAGITIS 02/23/2007    Past Surgical History:  Procedure Laterality Date   ABDOMINAL AORTOGRAM W/LOWER EXTREMITY Bilateral 08/12/2020   Procedure: ABDOMINAL AORTOGRAM W/BILATERAL LOWER EXTREMITY RUNOFF;  Surgeon: Serafina Mitchell, MD;  Location: [place] [facility];  Service: Cardiovascular;  Laterality: Bilateral;   ABI  02/2012  ABI <0.65 BL 02/2012   BRAIN MENINGIOMA EXCISION     BRAIN TUMOR EXCISION     Bypass grafting of RLE for PAD claudication      CARDIAC CATHETERIZATION  04/09/208   CESAREAN SECTION     1974, 78 ,80   CHOLECYSTECTOMY, LAPAROSCOPIC     COLONOSCOPY  2014   [place], [place]   ENDARTERECTOMY FEMORAL Right 10/03/2020   Procedure: RIGHT FEMORAL ENDARTERECTOMY WITH VEIN PATCH ANGIOPLASTY;  Surgeon: Serafina Mitchell, MD;  Location: [facility] OR;   Service: Vascular;  Laterality: Right;   ESOPHAGEAL DILATION     FEMORAL-POPLITEAL BYPASS GRAFT Right 10/03/2020   Procedure: RIGHT FEMORAL- BELOW KNEE POPLITEAL BYPASS USING GORE PROPATEN 6MM GRAFT;  Surgeon: Serafina Mitchell, MD;  Location: [place];  Service: Vascular;  Laterality: Right;   PATCH ANGIOPLASTY  10/03/2020   Procedure: VEIN PATCH ANGIOPLASTY;  Surgeon: Serafina Mitchell, MD;  Location: [facility] OR;  Service: Vascular;;     OB History   No obstetric history on file.     Family History  Problem Relation Age of Onset   Heart disease Mother    Asthma Mother    Cancer Mother        uterine    Depression Mother    Heart attack Mother 57   Hyperlipidemia Mother    Hypertension Mother    Stroke Mother    Kidney disease Mother    Heart attack Sister    Stroke Sister    Depression Sister    Diabetes Sister    Hyperlipidemia Sister    Depression Sister    Diabetes Sister    Hyperlipidemia Sister    HIV/AIDS Sister    Diabetes Brother    Asthma Brother    Hypertension Brother    Cancer Maternal Aunt        lung   Heart disease Maternal Grandmother    Heart attack Maternal Grandmother    Cancer Brother        colon   HIV/AIDS Brother    Colon cancer Neg Hx     Social History   Tobacco Use   Smoking status: Every Day    Packs/day: 0.25    Years: 30.00    Pack years: 7.50    Types: Cigarettes    Start date: 01/01/1976   Smokeless tobacco: Never   Tobacco comments:    ~ 3 cigarettes / day  Vaping Use   Vaping Use: Never used  Substance Use Topics   Alcohol use: Not Currently   Drug use: Not Currently    Home Medications Prior to Admission medications   Medication Sig Start Date End Date Taking? Authorizing Provider  acetaminophen (TYLENOL) 500 MG tablet Take 500-1,000 mg by mouth every 6 (six) hours as needed for moderate pain.    Yes [provider]  albuterol (ACCUNEB) 0.63 MG/3ML nebulizer solution Inhale 1 vial via nebulizer every 6 hours for  wheezing as needed Patient taking differently: Take 1 ampule by nebulization every 6 (six) hours as needed for wheezing or shortness of breath. 04/27/21  Yes Mullis, Kiersten P, DO  albuterol (VENTOLIN HFA) 108 (90 Base) MCG/ACT inhaler Inhale 2 puffs into the lungs every 6 (six) hours as needed for wheezing or shortness of breath. 06/12/21  Yes Mullis, Kiersten P, DO  allopurinol (ZYLOPRIM) 300 MG tablet Take 1/2 (one-half) tablet by mouth twice daily Patient taking differently: Take 150 mg by mouth 2 (two) times daily. 06/19/21  Yes Brunetta Genera,  MD  amLODipine (NORVASC) 5 MG tablet Take 5 mg by mouth daily. 04/23/21  Yes [provider]  atorvastatin (LIPITOR) 80 MG tablet Take 1 tablet by mouth every day 07/02/21  Yes Turner, Traci R, MD  b complex vitamins capsule Take 1 capsule by mouth daily. 12/05/20  Yes Kale, Cloria [place], MD  BREO ELLIPTA 100-25 MCG/INH AEPB Inhale 1 puff into the lungs daily. 04/23/21  Yes [provider]  clopidogrel (PLAVIX) 75 MG tablet Take 75 mg by mouth daily. 04/23/21  Yes [provider]  cyclobenzaprine (FLEXERIL) 10 MG tablet Take 1 tablet (10 mg total) by mouth 3 (three) times daily as needed for muscle spasms. 05/04/21  Yes Mullis, Kiersten P, DO  diclofenac Sodium (VOLTAREN) 1 % GEL Apply 2 grams topically four times a day 02/09/21  Yes Mullis, Kiersten P, DO  Dulaglutide (TRULICITY) 9.02 IO/9.7DZ SOPN Inject 0.75 mg into the skin once a week.   Yes [provider]  DULoxetine (CYMBALTA) 30 MG capsule Take 1 capsule (30 mg total) by mouth daily. 05/04/21  Yes Mullis, Kiersten P, DO  famotidine (PEPCID) 20 MG tablet Take 20 mg by mouth 2 (two) times daily. 04/23/21  Yes [provider]  furosemide (LASIX) 20 MG tablet Take 1 tablet (20 mg total) by mouth daily. 06/09/20  Yes Enid Derry, [place], DO  gabapentin (NEURONTIN) 300 MG capsule Take 600 mg by mouth 2 (two) times daily. 04/23/21  Yes [provider]   hydrALAZINE (APRESOLINE) 50 MG tablet Take 1 tablet (50 mg total) by mouth 3 (three) times daily. 05/04/21  Yes Mullis, Kiersten P, DO  iron polysaccharides (NIFEREX) 150 MG capsule Take 1 capsule (150 mg total) by mouth daily. 12/05/20  Yes Brunetta Genera, MD  JARDIANCE 25 MG TABS tablet Take 25 mg by mouth daily. 07/16/21  Yes [provider]  metFORMIN (GLUCOPHAGE) 500 MG tablet Take 1 tablet (500 mg total) by mouth daily. 05/04/21  Yes Mullis, Kiersten P, DO  montelukast (SINGULAIR) 10 MG tablet Take 1 tablet (10 mg total) by mouth at bedtime. 02/27/21  Yes Mullis, Kiersten P, DO  nitroGLYCERIN (NITROSTAT) 0.4 MG SL tablet Dissolve 1 tablet under tongue every 5 minutes, up to 3 doses for chest pain 05/04/21  Yes Mullis, Kiersten P, DO  traMADol (ULTRAM) 50 MG tablet Take 1 tablet 2 times a day as needed.  Will prescribe 1 months supply.  Please see me in clinic in the next month for further refills. Patient taking differently: Take 50 mg by mouth 2 (two) times daily as needed for moderate pain. Will prescribe 1 months supply.  Please see me in clinic in the next month for further refills. 07/17/21  Yes Maness, Arnette Norris, MD  venetoclax (VENCLEXTA) 100 MG tablet Take 2 tablets (200 mg total) by mouth daily. Tablets should be swallowed whole with a meal and a full glass of water. 05/07/21  Yes Brunetta Genera, MD  Alcohol Swabs (ALCOHOL PADS) 70 % PADS SMARTSIG:Pledget(s) Topical Daily 11/27/20   [provider]  Blood Glucose Calibration (OT ULTRA/FASTTK CNTRL SOLN) SOLN See admin instructions. 11/27/20   [provider]  Blood Glucose Monitoring Suppl (ONETOUCH VERIO) w/Device KIT 1 Device by Does not apply route daily. 04/26/18   Smiley Houseman, MD  glucose blood (ONETOUCH VERIO) test strip Use to check blood sugar once a day 04/26/18   Smiley Houseman, MD  Lancet Devices (EASY MINI EJECT LANCING DEVICE) MISC daily. 11/27/20   [provider]  Lancets  (ONETOUCH ULTRASOFT) lancets Use to check blood sugar once a day 04/26/18   Smiley Houseman, MD  lidocaine (XYLOCAINE) 5 % ointment Apply 1 application topically as needed. Apply to R>L feet when burning up to 4x/day as needed 08/18/20   Lovorn, Jinny Blossom, MD  prochlorperazine (COMPAZINE) 10 MG tablet Take 1 tablet (10 mg total) by mouth every 6 (six) hours as needed (Nausea or vomiting). 02/19/21 04/21/21  Brunetta Genera, MD    Allergies    Dyann Kief [obinutuzumab], Aspirin, No healthtouch food allergies, Aspirin, and Baclofen  Review of Systems   Review of Systems  Unable to perform ROS: Mental status change  Constitutional:  Positive for fatigue and fever.  Eyes:  Negative for visual disturbance.  Respiratory:  Positive for cough. Negative for shortness of breath.   Cardiovascular:  Negative for chest pain.  Gastrointestinal:  Negative for abdominal pain, diarrhea, nausea and vomiting.  Genitourinary:  Negative for difficulty urinating.  Skin:  Negative for rash.  Neurological:  Negative for syncope and headaches.  Psychiatric/Behavioral:  Positive for confusion.    Physical Exam Updated Vital Signs BP (!) 145/80 (BP Location: Left Arm)   Pulse 99   Temp (!) 100.5 F (38.1 C) (Axillary)   Resp 18   Ht '5\' 2"'  (1.575 m)   Wt 64 kg   SpO2 99%   BMI 25.81 kg/m   Physical Exam Vitals and nursing note reviewed.  Constitutional:      General: She is not in acute distress.    Appearance: She is well-developed. She is ill-appearing and toxic-appearing. She is not diaphoretic.  HENT:     Head: Normocephalic and atraumatic.  Eyes:     Conjunctiva/sclera: Conjunctivae normal.  Cardiovascular:     Rate and Rhythm: Normal rate and regular rhythm.     Heart sounds: Normal heart sounds. No murmur heard.   No friction rub. No gallop.  Pulmonary:     Effort: Pulmonary effort is normal. No respiratory distress.     Breath sounds: Normal breath sounds. No wheezing or rales.  Abdominal:      General: There is no distension.     Palpations: Abdomen is soft.     Tenderness: There is no abdominal tenderness. There is no guarding.  Musculoskeletal:        General: No tenderness.     Cervical back: Normal range of motion.  Skin:    General: Skin is warm and dry.     Findings: No erythema or rash.  Neurological:     Comments: On arrival moaning, not following commands, eyes open, localizing    ED Results / Procedures / Treatments   Labs (all labs ordered are listed, but only abnormal results are displayed) Labs Reviewed  COMPREHENSIVE METABOLIC PANEL - Abnormal; Notable for the following components:      Result Value   Potassium 3.2 (*)    Glucose, Bld 111 (*)    Creatinine, Ser 1.76 (*)    Total Protein 6.0 (*)    Albumin 3.0 (*)    Alkaline Phosphatase 188 (*)    Total Bilirubin 2.1 (*)    GFR, Estimated 30 (*)    All other components within normal limits  CBC WITH DIFFERENTIAL/PLATELET - Abnormal; Notable for the following components:   WBC 20.5 (*)    Hemoglobin 11.8 (*)    RDW 19.3 (*)    Neutro Abs 14.5 (*)    Monocytes Absolute 3.9 (*)  Abs Immature Granulocytes 0.56 (*)    All other components within normal limits  PROTIME-INR - Abnormal; Notable for the following components:   Prothrombin Time 15.9 (*)    INR 1.3 (*)    All other components within normal limits  TROPONIN I (HIGH SENSITIVITY) - Abnormal; Notable for the following components:   Troponin I (High Sensitivity) 33 (*)    All other components within normal limits  RESP PANEL BY RT-PCR (FLU A&B, COVID) ARPGX2  CULTURE, BLOOD (ROUTINE X 2)  CULTURE, BLOOD (ROUTINE X 2)  URINE CULTURE  LACTIC ACID, PLASMA  APTT  LACTIC ACID, PLASMA  URINALYSIS, ROUTINE W REFLEX MICROSCOPIC  HEMOGLOBIN A1C  COMPREHENSIVE METABOLIC PANEL  CBC  TROPONIN I (HIGH SENSITIVITY)    EKG None  Radiology CT Head Wo Contrast  Result Date: 07/23/2021 CLINICAL DATA:  Delirium. EXAM: CT HEAD WITHOUT  CONTRAST TECHNIQUE: Contiguous axial images were obtained from the base of the skull through the vertex without intravenous contrast. COMPARISON:  Prior head CT examinations 05/19/2021 and earlier. Brain MRI 02/23/2016. FINDINGS: Brain: Cerebral volume is normal for age. Redemonstrated region of encephalomalacia/gliosis within the high left frontoparietal lobes, deep to a left parietal cranioplasty. Small amount of dural thickening anterior to the resection site presumably reflecting a small amount of residual meningioma, not appreciably changed as compared to the head CT of 05/19/2021 Unchanged subcentimeter dural-based mass along the left aspect of the falx (overlying the left frontal lobe) compatible with additional small meningioma (series 6, image 23). This was seen to better advantage on the prior MRI of 02/23/2016. Mild patchy and ill-defined hypoattenuation within the cerebral white matter, nonspecific but compatible chronic small vessel ischemic disease. There is no acute intracranial hemorrhage. No acute demarcated cortical infarct. No extra-axial fluid collection. Partially empty sella turcica. No hyperdense vessel.  Atherosclerotic calcifications. Vascular: No hyperdense vessel.  Atherosclerotic calcifications. Skull: Left parietal cranioplasty. No calvarial fracture or focal suspicious osseous lesion. Sinuses/Orbits: Visualized orbits show no acute finding. Mild mucosal thickening within the bilateral frontal, ethmoid and sphenoid sinuses. 2 cm left maxillary sinus mucous retention cyst. Background mild mucosal thickening within the maxillary sinuses at the imaged levels. IMPRESSION: No acute intracranial hemorrhage or acute demarcated cortical infarction. Redemonstrated sequela of prior left parietal craniotomy for meningioma resection, with encephalomalacia/gliosis in the underlying high left frontoparietal lobes. A small amount of dural thickening anterior to the resection site, presumably represents  residual meningioma, and has not appreciably changed as compared to the head CT of 05/19/2021. Unchanged subcentimeter meningioma along the left aspect of the falx (overlying the medial left frontal lobe). Mild chronic small vessel ischemic changes within the cerebral white matter. Paranasal sinus disease, as described. Electronically Signed   By: Kellie Simmering DO   On: 07/23/2021 13:21   DG Chest Port 1 View  Result Date: 07/23/2021 CLINICAL DATA:  Sepsis EXAM: PORTABLE CHEST 1 VIEW COMPARISON:  06/19/2020 FINDINGS: Stable cardiomediastinal contours. Atherosclerotic calcification of the aortic knob. Dense airspace opacity within the right upper lobe extending into the right perihilar region. Patchy left basilar opacity. Minimal streaky right basilar opacity. No pleural effusion or pneumothorax. IMPRESSION: 1. Dense airspace opacity within the right upper lobe extending into the right perihilar region, concerning for pneumonia. Follow-up to resolution is recommended. 2. Left greater than right bibasilar opacities may reflect atelectasis versus multifocal pneumonia. Electronically Signed   By: Davina Poke D.O.   On: 07/23/2021 11:50    Procedures .Critical Care  Date/Time: 08/10/2021 10:56 AM Performed  by: Gareth Morgan, MD Authorized by: Gareth Morgan, MD   Critical care provider statement:    Critical care time (minutes):  45   Critical care was time spent personally by me on the following activities:  Evaluation of patient's response to treatment, examination of patient, ordering and performing treatments and interventions, ordering and review of laboratory studies, ordering and review of radiographic studies, pulse oximetry, re-evaluation of patient's condition, obtaining history from patient or surrogate and review of old charts   Medications Ordered in ED Medications  vancomycin (VANCOREADY) IVPB 1250 mg/250 mL (has no administration in time range)  ceFEPIme (MAXIPIME) 2 g in sodium  chloride 0.9 % 100 mL IVPB (has no administration in time range)  vancomycin (VANCOREADY) IVPB 500 mg/100 mL (has no administration in time range)  acetaminophen (TYLENOL) tablet 650 mg (has no administration in time range)    Or  acetaminophen (TYLENOL) suppository 325 mg (has no administration in time range)  traMADol (ULTRAM) tablet 50 mg (50 mg Oral Not Given 07/23/21 1710)  traMADol (ULTRAM) tablet 50 mg (has no administration in time range)  allopurinol (ZYLOPRIM) tablet 150 mg (has no administration in time range)  venetoclax (VENCLEXTA) tablet 200 mg (has no administration in time range)  amLODipine (NORVASC) tablet 5 mg (has no administration in time range)  atorvastatin (LIPITOR) tablet 80 mg (has no administration in time range)  hydrALAZINE (APRESOLINE) tablet 50 mg (has no administration in time range)  DULoxetine (CYMBALTA) DR capsule 30 mg (has no administration in time range)  famotidine (PEPCID) tablet 20 mg (has no administration in time range)  clopidogrel (PLAVIX) tablet 75 mg (has no administration in time range)  iron polysaccharides (NIFEREX) capsule 150 mg (has no administration in time range)  fluticasone furoate-vilanterol (BREO ELLIPTA) 100-25 MCG/INH 1 puff (has no administration in time range)  montelukast (SINGULAIR) tablet 10 mg (has no administration in time range)  enoxaparin (LOVENOX) injection 30 mg (has no administration in time range)  diclofenac Sodium (VOLTAREN) 1 % topical gel 2 g (has no administration in time range)  lidocaine (XYLOCAINE) 5 % ointment 1 application (has no administration in time range)  insulin aspart (novoLOG) injection 0-9 Units (has no administration in time range)  potassium chloride SA (KLOR-CON) CR tablet 40 mEq (has no administration in time range)  lactated ringers infusion (has no administration in time range)  B-complex with vitamin C tablet 1 tablet (has no administration in time range)  albuterol (PROVENTIL) (2.5 MG/3ML)  0.083% nebulizer solution 2.5 mg (has no administration in time range)  sodium chloride 0.9 % bolus 1,000 mL (0 mLs Intravenous Stopped 07/23/21 1354)    And  sodium chloride 0.9 % bolus 1,000 mL (0 mLs Intravenous Stopped 07/23/21 1432)  ceFEPIme (MAXIPIME) 2 g in sodium chloride 0.9 % 100 mL IVPB (0 g Intravenous Stopped 07/23/21 1354)  metroNIDAZOLE (FLAGYL) IVPB 500 mg (0 mg Intravenous Stopped 07/23/21 1537)  acetaminophen (TYLENOL) suppository 650 mg (650 mg Rectal Given 07/23/21 1218)    ED Course  I have reviewed the triage vital signs and the nursing notes.  Pertinent labs & imaging results that were available during my care of the patient were reviewed by me and considered in my medical decision making (see chart for details).    MDM Rules/Calculators/A&P                            75 year old female with a history of CLL, brain tumor,  CVA, diabetes, hypertension, hyperlipidemia, peripheral vascular disease with history of femoropopliteal bypass and femoral endarterectomy, presents with concern for altered mental status from home.  On arrival to the emergency department, patient has Temperature of 103.4, is altered, not responding to questions, hypoxic with an oxygen saturation of 89% on room air, sleepy however protecting her airway.  She is tachycardic and tachypneic.  Ordered IV fluids and empiric antibiotics for sepsis secondary to unknown source.  She did not appear to have nuchal rigidity.   Chest x-ray shows dense airspace opacity within the right upper lobe extending to the right perihilar region concerning for pneumonia, and left greater than right bibasilar opacities which may reflect atelectasis versus multifocal pneumonia.  COVID testing negative, urinalysis is pending.  CT head shows no acute abnormalities.  Labs show normal lactic acid.  Creatinine is near baseline.  Has chronically elevated alk phos, with mildly elevated bilirubin of 2.1 today.  No  transaminitis.  On reevaluation, she is improving, she is now oriented to self, location and month.  Will admit to the hospital for sepsis secondary to pneumonia.   Final Clinical Impression(s) / ED Diagnoses Final diagnoses:  Encephalopathy  Sepsis with encephalopathy without septic shock, due to unspecified organism [facility])  Community acquired pneumonia of right lung, unspecified part of lung  Hypoxia    Rx / DC Orders ED Discharge Orders     None        Gareth Morgan, MD 07/23/21 1975    Gareth Morgan, MD 08/10/21 1056

## 2021-07-23 NOTE — Progress Notes (Signed)
FPTS Brief Progress Note  S Saw patient at bedside this evening. Patient was sleeping comfortably. Per RN pt was awake earlier and ANO x 4 but appeared slightly confused. She was able to have some of her meds with apple sauce and drank water without issues.     O: BP (!) 156/77   Pulse 99   Temp 98.9 F (37.2 C) (Oral)   Resp 18   Ht '5\' 2"'$  (1.575 m)   Wt 64 kg   SpO2 99%   BMI 25.81 kg/m    General: sleeping, comfortable  Sats 98% on 4L.  A/P: Plan per day team  Sepsis 2/2 PNA -Continue Abx -Monitor respiratory status closely -Monitor fever curve -Tylenol '650mg'$  Q6HPRN and tramadol '50mg'$  BID PRN for chest pain -Carb modified diet  -F/u blood cx  - Orders reviewed. Labs for AM ordered, which was adjusted as needed.    Lattie Haw, MD 07/23/2021, 10:00 PM PGY-3, [place] Night Resident  Please page [phone] with questions.

## 2021-07-24 ENCOUNTER — Encounter (HOSPITAL_COMMUNITY): Payer: Self-pay | Admitting: Family Medicine

## 2021-07-24 ENCOUNTER — Observation Stay (HOSPITAL_COMMUNITY): Payer: Medicare Other

## 2021-07-24 ENCOUNTER — Encounter
Payer: Medicare Other | Attending: Physical Medicine and Rehabilitation | Admitting: Physical Medicine and Rehabilitation

## 2021-07-24 ENCOUNTER — Inpatient Hospital Stay (HOSPITAL_COMMUNITY): Payer: Medicare Other

## 2021-07-24 DIAGNOSIS — Z91018 Allergy to other foods: Secondary | ICD-10-CM | POA: Diagnosis not present

## 2021-07-24 DIAGNOSIS — E1151 Type 2 diabetes mellitus with diabetic peripheral angiopathy without gangrene: Secondary | ICD-10-CM | POA: Diagnosis present

## 2021-07-24 DIAGNOSIS — J440 Chronic obstructive pulmonary disease with acute lower respiratory infection: Secondary | ICD-10-CM | POA: Diagnosis present

## 2021-07-24 DIAGNOSIS — Z8249 Family history of ischemic heart disease and other diseases of the circulatory system: Secondary | ICD-10-CM | POA: Diagnosis not present

## 2021-07-24 DIAGNOSIS — Z83438 Family history of other disorder of lipoprotein metabolism and other lipidemia: Secondary | ICD-10-CM | POA: Diagnosis not present

## 2021-07-24 DIAGNOSIS — R1084 Generalized abdominal pain: Secondary | ICD-10-CM | POA: Diagnosis not present

## 2021-07-24 DIAGNOSIS — G9340 Encephalopathy, unspecified: Secondary | ICD-10-CM | POA: Diagnosis present

## 2021-07-24 DIAGNOSIS — Z809 Family history of malignant neoplasm, unspecified: Secondary | ICD-10-CM | POA: Diagnosis not present

## 2021-07-24 DIAGNOSIS — J9601 Acute respiratory failure with hypoxia: Secondary | ICD-10-CM | POA: Diagnosis present

## 2021-07-24 DIAGNOSIS — J189 Pneumonia, unspecified organism: Secondary | ICD-10-CM | POA: Diagnosis present

## 2021-07-24 DIAGNOSIS — Z825 Family history of asthma and other chronic lower respiratory diseases: Secondary | ICD-10-CM | POA: Diagnosis not present

## 2021-07-24 DIAGNOSIS — Z886 Allergy status to analgesic agent status: Secondary | ICD-10-CM | POA: Diagnosis not present

## 2021-07-24 DIAGNOSIS — Z20822 Contact with and (suspected) exposure to covid-19: Secondary | ICD-10-CM | POA: Diagnosis present

## 2021-07-24 DIAGNOSIS — R0902 Hypoxemia: Secondary | ICD-10-CM | POA: Diagnosis not present

## 2021-07-24 DIAGNOSIS — K219 Gastro-esophageal reflux disease without esophagitis: Secondary | ICD-10-CM | POA: Diagnosis present

## 2021-07-24 DIAGNOSIS — G931 Anoxic brain damage, not elsewhere classified: Secondary | ICD-10-CM | POA: Diagnosis present

## 2021-07-24 DIAGNOSIS — A413 Sepsis due to Hemophilus influenzae: Secondary | ICD-10-CM | POA: Diagnosis present

## 2021-07-24 DIAGNOSIS — N1831 Chronic kidney disease, stage 3a: Secondary | ICD-10-CM | POA: Diagnosis present

## 2021-07-24 DIAGNOSIS — C9110 Chronic lymphocytic leukemia of B-cell type not having achieved remission: Secondary | ICD-10-CM | POA: Diagnosis present

## 2021-07-24 DIAGNOSIS — Z888 Allergy status to other drugs, medicaments and biological substances status: Secondary | ICD-10-CM | POA: Diagnosis not present

## 2021-07-24 DIAGNOSIS — E1122 Type 2 diabetes mellitus with diabetic chronic kidney disease: Secondary | ICD-10-CM | POA: Diagnosis present

## 2021-07-24 DIAGNOSIS — F17210 Nicotine dependence, cigarettes, uncomplicated: Secondary | ICD-10-CM | POA: Diagnosis present

## 2021-07-24 DIAGNOSIS — I130 Hypertensive heart and chronic kidney disease with heart failure and stage 1 through stage 4 chronic kidney disease, or unspecified chronic kidney disease: Secondary | ICD-10-CM | POA: Diagnosis present

## 2021-07-24 DIAGNOSIS — A419 Sepsis, unspecified organism: Secondary | ICD-10-CM | POA: Diagnosis not present

## 2021-07-24 DIAGNOSIS — E7800 Pure hypercholesterolemia, unspecified: Secondary | ICD-10-CM | POA: Diagnosis present

## 2021-07-24 DIAGNOSIS — Z8673 Personal history of transient ischemic attack (TIA), and cerebral infarction without residual deficits: Secondary | ICD-10-CM | POA: Diagnosis not present

## 2021-07-24 DIAGNOSIS — M109 Gout, unspecified: Secondary | ICD-10-CM | POA: Diagnosis present

## 2021-07-24 DIAGNOSIS — I5032 Chronic diastolic (congestive) heart failure: Secondary | ICD-10-CM | POA: Diagnosis present

## 2021-07-24 LAB — CBC
HCT: 37.6 % (ref 36.0–46.0)
Hemoglobin: 11.8 g/dL — ABNORMAL LOW (ref 12.0–15.0)
MCH: 28.2 pg (ref 26.0–34.0)
MCHC: 31.4 g/dL (ref 30.0–36.0)
MCV: 90 fL (ref 80.0–100.0)
Platelets: 183 10*3/uL (ref 150–400)
RBC: 4.18 MIL/uL (ref 3.87–5.11)
RDW: 19.9 % — ABNORMAL HIGH (ref 11.5–15.5)
WBC: 23.4 10*3/uL — ABNORMAL HIGH (ref 4.0–10.5)
nRBC: 0 % (ref 0.0–0.2)

## 2021-07-24 LAB — COMPREHENSIVE METABOLIC PANEL
ALT: 40 U/L (ref 0–44)
AST: 43 U/L — ABNORMAL HIGH (ref 15–41)
Albumin: 2.7 g/dL — ABNORMAL LOW (ref 3.5–5.0)
Alkaline Phosphatase: 173 U/L — ABNORMAL HIGH (ref 38–126)
Anion gap: 12 (ref 5–15)
BUN: 19 mg/dL (ref 8–23)
CO2: 21 mmol/L — ABNORMAL LOW (ref 22–32)
Calcium: 8.7 mg/dL — ABNORMAL LOW (ref 8.9–10.3)
Chloride: 110 mmol/L (ref 98–111)
Creatinine, Ser: 1.51 mg/dL — ABNORMAL HIGH (ref 0.44–1.00)
GFR, Estimated: 36 mL/min — ABNORMAL LOW (ref >60)
Glucose, Bld: 124 mg/dL — ABNORMAL HIGH (ref 70–99)
Potassium: 3.9 mmol/L (ref 3.5–5.1)
Sodium: 143 mmol/L (ref 135–145)
Total Bilirubin: 1.8 mg/dL — ABNORMAL HIGH (ref 0.3–1.2)
Total Protein: 5.7 g/dL — ABNORMAL LOW (ref 6.5–8.1)

## 2021-07-24 LAB — BLOOD CULTURE ID PANEL (REFLEXED) - BCID2

## 2021-07-24 LAB — GLUCOSE, CAPILLARY
Glucose-Capillary: 109 mg/dL — ABNORMAL HIGH (ref 70–99)
Glucose-Capillary: 110 mg/dL — ABNORMAL HIGH (ref 70–99)
Glucose-Capillary: 112 mg/dL — ABNORMAL HIGH (ref 70–99)

## 2021-07-24 LAB — HEMOGLOBIN A1C
Hgb A1c MFr Bld: 7 % — ABNORMAL HIGH (ref 4.8–5.6)
Mean Plasma Glucose: 154 mg/dL

## 2021-07-24 MED ORDER — SODIUM CHLORIDE 0.9 % IV BOLUS
1000.0000 mL | Freq: Once | INTRAVENOUS | Status: AC
  Administered 2021-07-24: 1000 mL via INTRAVENOUS

## 2021-07-24 MED ORDER — ACETAMINOPHEN 325 MG PO TABS
650.0000 mg | ORAL_TABLET | Freq: Once | ORAL | Status: AC
  Administered 2021-07-24: 650 mg via ORAL
  Filled 2021-07-24: qty 2

## 2021-07-24 MED ORDER — TRAMADOL HCL 50 MG PO TABS
50.0000 mg | ORAL_TABLET | Freq: Once | ORAL | Status: AC
  Administered 2021-07-24: 50 mg via ORAL
  Filled 2021-07-24: qty 1

## 2021-07-24 MED ORDER — ACETAMINOPHEN 325 MG PO TABS: 650.0000 mg | ORAL_TABLET | Freq: Once | ORAL | Status: DC

## 2021-07-24 MED ORDER — MORPHINE SULFATE (PF) 2 MG/ML IV SOLN
1.0000 mg | INTRAVENOUS | Status: AC
  Administered 2021-07-24: 1 mg via INTRAVENOUS
  Filled 2021-07-24: qty 1

## 2021-07-24 MED ORDER — IOHEXOL 300 MG/ML  SOLN
75.0000 mL | Freq: Once | INTRAMUSCULAR | Status: AC | PRN
  Administered 2021-07-24: 75 mL via INTRAVENOUS

## 2021-07-24 MED ORDER — BIOTENE DRY MOUTH MT LIQD
15.0000 mL | Freq: Two times a day (BID) | OROMUCOSAL | Status: DC
  Administered 2021-07-24 – 2021-07-27 (×6): 15 mL via OROMUCOSAL

## 2021-07-24 MED ORDER — IOHEXOL 300 MG/ML  SOLN: 100.0000 mL | Freq: Once | INTRAMUSCULAR | Status: DC | PRN

## 2021-07-24 MED ORDER — SODIUM CHLORIDE 0.9 % IV SOLN
2.0000 g | INTRAVENOUS | Status: DC
  Administered 2021-07-24 – 2021-07-26 (×3): 2 g via INTRAVENOUS
  Filled 2021-07-24 (×2): qty 2
  Filled 2021-07-24 (×2): qty 20

## 2021-07-24 MED ORDER — MORPHINE SULFATE (PF) 2 MG/ML IV SOLN
1.0000 mg | INTRAVENOUS | Status: AC | PRN
  Administered 2021-07-24 – 2021-07-26 (×6): 2 mg via INTRAVENOUS
  Filled 2021-07-24 (×6): qty 1

## 2021-07-24 MED ORDER — IOHEXOL 9 MG/ML PO SOLN
ORAL | Status: AC
  Filled 2021-07-24: qty 1000

## 2021-07-24 NOTE — Progress Notes (Signed)
Went to evaluate patient due to complaints of abdominal discomfort.  Found patient lying in bed with complaints lower abdominal discomfort.  On palpation she had most of her discomfort in the suprapubic area.  I spoke with the nurse and ordered an ultrasound to evaluate for urinary output as she did not have any urine documented overnight.  Ultrasound was complete with 250 mL present in the bladder and I and O were updated to reflect 1000 cc urine put out overnight per nursing staff.  Patient still has abdominal discomfort.    Spoke with radiology regarding oral contrast versus IV contrast for abdominal discomfort with CT scan.  The radiologist said that oral contrast can do well for looking at small bowel obstructions, etc. but IV contrast provides substantial improvements in the image quality to evaluate for possible causes of her abdominal discomfort.  Spoke to nephrology due to the fact she has CKD stage III with a GFR of 36.  Nephrology stated that the most important thing is to make sure she is not fluid down before giving her the IV contrast.  Nephrology stated that if she appears fluid down, which she does (with dry, cracked mucous membranes), that if appropriate the test may be better served waiting till tomorrow or at the least this afternoon.  The patient does have a history of HFpEF.  She received 2 L of fluid yesterday as well as maintenance IV fluids but still has dry mucous membranes and appears fluid down on my exam.  She is on 4 L of oxygen, normally on room air at home but has pneumonia at this time and so I do not believe that her HFpEF is the cause of this even with her fluid she received yesterday.  We will put in for an additional liter of IV fluids and plan for likely abdominal CT with IV contrast later this afternoon, or sooner if her abdominal discomfort worsens.  We want to try to avoid stressing her kidneys if at all possible with the contrast.

## 2021-07-24 NOTE — Progress Notes (Signed)
Patient is hollering out in pain, patient states that she hurts all over.  Patient states that she has chest pain, but she points to the top of her abdomen when ask to show were it hurts, patient also states that she has pain in her legs and in her throat.  Pain medication given as ordered by MD.  MD notified. Will continue to monitor.    Donah Driver, RN

## 2021-07-24 NOTE — Progress Notes (Signed)
PHARMACY - PHYSICIAN COMMUNICATION CRITICAL VALUE ALERT - BLOOD CULTURE IDENTIFICATION (BCID)  [name] [name] is an 75 y.o. female who presented to [facility] on 07/23/2021 with a chief complaint of AMS and fever, suspected to have PNA  Assessment:  haemophilus influenzae 4/4 in patient with PNA  Name of physician (or Provider) Contacted: Dr. Jim Like  Current antibiotics: cefepime and vancomycin   Changes to prescribed antibiotics recommended:  Change to ceftriaxone  Recommendations accepted  No results found for this or any previous visit.  [name] 07/24/2021  8:41 AM

## 2021-07-24 NOTE — Progress Notes (Signed)
Paged by RN that patient is calling out in pain and crying. When I went to the her bedside she was sleeping comfortably so I did not wake the patient. Plan per previous note. Delay vital signs until a little later this morning so patient can sleep without disturbances. Delirium precautions added.  Lattie Haw MD  PGY-3, [facility]

## 2021-07-24 NOTE — Progress Notes (Addendum)
Family Medicine Teaching Service Daily Progress Note Intern Pager: [phone]  Patient name: [name] [name] Medical record number: [id number] Date of birth: 1946-06-09 Age: 75 y.o. Gender: female  Primary Care Provider: Delora Fuel, MD Consultants: None Code Status: DNI  Pt Overview and Major Events to Date:  7/28- admitted  Assessment and Plan: [name] [name] is a 75yo female with a history of CLL, meningioma s/p craniotomy and resection, CVA, COPD, diabetes, HTN, HLD, PVD s/p femoropopliteal bypass and femoral endarterectomy. She presented with AMS and fever, found to have pneumonia on X-Ray  Sepsis  PNA  H Flu Bacteremia  Has been afebrile overnight. Maintaining O2 sats in mid-90s on 4L [place]. WBC from 20.5>23.4. Was started on broad spectrum antibiotics on admission due to meeting sepsis criteria. Blood cx grew up H flu.  - Will narrow to Ceftriaxone to cover H Flu  (day 2 abx) - Supplement O2 with goal greater than 88% - Chest physiotherapy - Incentive spirometry - Tylenol and Tramadol for pain  Abdominal Pain [name] [name] is complaining of abdominal pain and nausea this morning patient.  Has diffusely tender abdomen, worse in epigastrum.  She had some right upper quadrant tenderness to palpation on presentation yesterday but is acutely worse this morning.  Suspect that this is related to her pneumonia/bacteremia but feel that she would benefit from a scan to rule out intra-abdominal process. There is no urine output documented in chart, so consider that this may also represent urinary retention.  - Bladder scan - If bladder scan does not show retention, will obtain acute abdomen xray series - Will likely need CT Abdomen, per nephrology she would benefit from hydration prior to the study as a renal protective measure. Plan to administer two liters of fluid and hopeful for scan around 1500 today.   [id number]  Hypokalemia Resolved Cr improved s/p IVF overnight. 1.76>1.51, at baseline. K  3.2>3.9 with 40 mEq. -Monitor for contrast-induced AKI after CT  HFpEF  Hypertension No evidence of fluid overload on exam. BP in 140s/70s. Had been holding home Lasix in setting of hypovolemia on presentation and need for fluid resuscitation. - Continue home amlodipine '5mg'$  - Continue home hydralazine 50 mg TID  Chronic Lymphocytic Leukemia  Discussed with Dr. Irene Limbo, her oncologist. He suggests holding his venetoclax FEN/GI: Heart healthy/carb modified PPx: Lovenox Dispo:Home in 2-3 days. Barriers include clinical improvement.   Subjective:  [name] [name] reports that she is having abdominal pain today that is new from yesterday.  She had some nausea overnight without vomiting.  Objective: Temp:  [98 F (36.7 C)-103.4 F (39.7 C)] 98.4 F (36.9 C) (07/29 0737) Pulse Rate:  [82-99] 93 (07/29 0737) Resp:  [16-26] 25 (07/29 0737) BP: (140-162)/(62-87) 151/70 (07/29 0737) SpO2:  [94 %-99 %] 94 % (07/29 0737) Weight:  [64 kg] 64 kg (07/28 1600) Physical Exam: General: Uncomfortable appearing older female lying in bed, coughing and producing blood-tinged sputum Chest: Tender to palpation over sternum Cardiovascular: Regular rate, regular rhythm, no murmur/rub/gallop Respiratory: Cough productive of blood-tinged sputum, diffuse rhonchi in R Lung, bibasilar crackles. Increased WOB on 4L [place] Abdomen: Abdomen diffusely tender to deep and  Extremities: RLE more edematous than left. BLE tender to palpation.   Laboratory: Recent Labs  Lab 07/23/21 1153 07/24/21 0120  WBC 20.5* 23.4*  HGB 11.8* 11.8*  HCT 37.3 37.6  PLT 192 183   Recent Labs  Lab 07/23/21 1153 07/24/21 0120  NA 140 143  K 3.2* 3.9  CL 106 110  CO2 24 21*  BUN 15 19  CREATININE 1.76* 1.51*  CALCIUM 9.1 8.7*  PROT 6.0* 5.7*  BILITOT 2.1* 1.8*  ALKPHOS 188* 173*  ALT 38 40  AST 41 43*  GLUCOSE 111* 124*    Imaging/Diagnostic Tests: No new imaging/tests  Eppie Gibson, MD 07/24/2021, 8:56 AM PGY-1,  [place] Intern pager: [phone], text pages welcome

## 2021-07-25 DIAGNOSIS — G9340 Encephalopathy, unspecified: Secondary | ICD-10-CM | POA: Diagnosis not present

## 2021-07-25 DIAGNOSIS — J189 Pneumonia, unspecified organism: Secondary | ICD-10-CM | POA: Diagnosis not present

## 2021-07-25 DIAGNOSIS — A419 Sepsis, unspecified organism: Secondary | ICD-10-CM | POA: Diagnosis not present

## 2021-07-25 DIAGNOSIS — R0902 Hypoxemia: Secondary | ICD-10-CM | POA: Diagnosis not present

## 2021-07-25 LAB — CBC
HCT: 37.5 % (ref 36.0–46.0)
Hemoglobin: 11.7 g/dL — ABNORMAL LOW (ref 12.0–15.0)
MCH: 28.1 pg (ref 26.0–34.0)
MCHC: 31.2 g/dL (ref 30.0–36.0)
MCV: 89.9 fL (ref 80.0–100.0)
Platelets: 165 10*3/uL (ref 150–400)
RBC: 4.17 MIL/uL (ref 3.87–5.11)
RDW: 19.7 % — ABNORMAL HIGH (ref 11.5–15.5)
WBC: 26.5 10*3/uL — ABNORMAL HIGH (ref 4.0–10.5)
nRBC: 0.1 % (ref 0.0–0.2)

## 2021-07-25 LAB — GLUCOSE, CAPILLARY
Glucose-Capillary: 110 mg/dL — ABNORMAL HIGH (ref 70–99)
Glucose-Capillary: 119 mg/dL — ABNORMAL HIGH (ref 70–99)
Glucose-Capillary: 146 mg/dL — ABNORMAL HIGH (ref 70–99)
Glucose-Capillary: 118 mg/dL — ABNORMAL HIGH (ref 70–99)

## 2021-07-25 LAB — BASIC METABOLIC PANEL
Anion gap: 9 (ref 5–15)
BUN: 17 mg/dL (ref 8–23)
CO2: 21 mmol/L — ABNORMAL LOW (ref 22–32)
Calcium: 8.4 mg/dL — ABNORMAL LOW (ref 8.9–10.3)
Chloride: 112 mmol/L — ABNORMAL HIGH (ref 98–111)
Creatinine, Ser: 1.28 mg/dL — ABNORMAL HIGH (ref 0.44–1.00)
GFR, Estimated: 44 mL/min — ABNORMAL LOW (ref >60)
Glucose, Bld: 95 mg/dL (ref 70–99)
Potassium: 3.9 mmol/L (ref 3.5–5.1)
Sodium: 142 mmol/L (ref 135–145)

## 2021-07-25 MED ORDER — FUROSEMIDE 20 MG PO TABS
20.0000 mg | ORAL_TABLET | Freq: Every day | ORAL | Status: DC
  Administered 2021-07-25 – 2021-07-27 (×3): 20 mg via ORAL
  Filled 2021-07-25 (×3): qty 1

## 2021-07-25 MED ORDER — ENOXAPARIN SODIUM 40 MG/0.4ML IJ SOSY
40.0000 mg | PREFILLED_SYRINGE | INTRAMUSCULAR | Status: DC
  Administered 2021-07-25 – 2021-07-27 (×3): 40 mg via SUBCUTANEOUS
  Filled 2021-07-25 (×3): qty 0.4

## 2021-07-25 NOTE — Progress Notes (Signed)
FPTS Brief Progress Note  S: Sitting up in bed watching TV. States she is doing ok.    O: BP (!) 181/83 (BP Location: Left Arm)   Pulse 99   Temp 99.1 F (37.3 C) (Oral)   Resp (!) 22   Ht '5\' 2"'$  (1.575 m)   Wt 64 kg   SpO2 92%   BMI 25.81 kg/m    General: Appears tired but no acute distress. Age appropriate. Sitting up in bed watching TV.  Respiratory: Normal effort. Mild expiratory wheezing at lung bases.  A/P: Ms Friedt is a 75 year old female with a history of CLL, meningioma status postcraniotomy and resection, CVA, COPD, diabetes, hypertension, hyperlipidemia, peripheral vascular disease status post femoral-popliteal bypass and femoral endarterectomy.  She had presented with AMS and fever, found to have pneumonia on x-ray, subsequently found to have H. influenzae bacteremia.   Sepsis  CAP  H. Flu bacteremia Continued on 3 L [place].  - Orders reviewed. Labs for AM ordered, which was adjusted as needed.  - If condition changes, plan includes repeat CXR and repeat bld cx.   Gerlene Fee, DO 07/25/2021, 9:04 PM PGY-3, [facility] Night Resident  Please page [phone] with questions.

## 2021-07-25 NOTE — Progress Notes (Addendum)
Family Medicine Teaching Service Daily Progress Note Intern Pager: [phone]  Patient name: [name] [name] Medical record number: [id number] Date of birth: 05-05-1946 Age: 75 y.o. Gender: female  Primary Care Provider: Delora Fuel, MD Consultants: None Code Status: DNI  Pt Overview and Major Events to Date:  7/28- admitted  Assessment and Plan: Ms Cornwell is a 75 year old female with a history of CLL, meningioma status postcraniotomy and resection, CVA, COPD, diabetes, hypertension, hyperlipidemia, peripheral vascular disease status post femoral-popliteal bypass and femoral endarterectomy.  She had presented with AMS and fever, found to have pneumonia on x-ray, subsequently found to have H. influenzae bacteremia.  Sepsis  CAP  H. Flu bacteremia Continues to be afebrile.  O2 requirement decreased to 3 L this morning from 4 L previously.  WBC up slightly 23.4 > 26.5.  Overall feeling much better this morning.  Small bilateral pleural effusions noted on CT abdomen pelvis yesterday. -Continue ceftriaxone coverage for H. influenzae (day 3/14) -Supplement O2 with goal greater than 88% (history of COPD) -Chest physiotherapy, incentive spirometry -Tylenol and tramadol for pain  Abdominal Pain Diffuse abdominal pain noted yesterday is much improved today.  She is nontender to my exam today.  CT abdomen pelvis yesterday without evidence of acute intra-abdominal pathology.  Has been able to eat and drink without issue. -Tylenol and tramadol for pain as above  CDK3a Creatinine 1.28 today which is actually below her baseline of 1.5 despite IV CT contrast yesterday. -Follow on BMP  HFpEF  Hypertension Does not appear fluid overloaded on exam despite several boluses yesterday ahead of IV contrast administration.  Blood pressures elevated to 160s/90s. -Continue home amlodipine 5 mg daily and hydralazine 50 mg 3 times daily -Will plan to restart home Lasix 20 mg daily  COPD No wheezes on  exam today -Continue Breo -Continue Singulair  Diabetes CBGs 95-112.  -CBGs, sSSI  CLL Discussed care with Dr. Irene Limbo, oncologist, yesterday.  We will hold chemo in setting of active infection and for several weeks after discharge. -Hold venetoclax -Continue iron and B complex vitamins  FEN/GI: Heart healthy PPx: Lovenox Dispo:Home in 2-3 days. Barriers include clinical improvement, weaning oxygen.   Subjective:  Ms. Weniger reports feeling better today than yesterday.  Her abdominal pain is much improved, her chest pain is also improved though she is still uncomfortable.  She is feeling less short of breath but requiring 3 L nasal cannula.  She has no acute complaints at this time.  Has been able to eat and drink.  Objective: Temp:  [98.6 F (37 C)] 98.6 F (37 C) (07/30 0500) Pulse Rate:  [94-97] 97 (07/30 0735) Resp:  [17-20] 17 (07/30 0735) BP: (165-178)/(82-90) 165/90 (07/30 0827) SpO2:  [92 %-95 %] 95 % (07/30 0904) FiO2 (%):  [32 %] 32 % (07/30 0904) Physical Exam: General: Uncomfortable appearing Cardiovascular: Regular rate, regular rhythm, S1/S2 Respiratory: No coughing during my exam today, still with diffuse rhonchi and bibasilar crackles.  Normal work of breathing on 3 L nasal cannula Abdomen: Abdomen soft, nontender, bowel sounds normal Extremities: Lower extremities not edematous, left lower extremity remains tender to palpation which is her baseline  Laboratory: Recent Labs  Lab 07/23/21 1153 07/24/21 0120 07/25/21 0142  WBC 20.5* 23.4* 26.5*  HGB 11.8* 11.8* 11.7*  HCT 37.3 37.6 37.5  PLT 192 183 165   Recent Labs  Lab 07/23/21 1153 07/24/21 0120 07/25/21 0142  NA 140 143 142  K 3.2* 3.9 3.9  CL 106 110 112*  CO2  24 21* 21*  BUN '15 19 17  '$ CREATININE 1.76* 1.51* 1.28*  CALCIUM 9.1 8.7* 8.4*  PROT 6.0* 5.7*  --   BILITOT 2.1* 1.8*  --   ALKPHOS 188* 173*  --   ALT 38 40  --   AST 41 43*  --   GLUCOSE 111* 124* 95    Imaging/Diagnostic  Tests: DG Acute Abdominal Series IMPRESSION: 1. Nonobstructed bowel gas pattern. 2. Stable to slight worsening of dense right upper lobe consolidation compatible with pneumonia. Radiographic follow-up is recommended to ensure resolution.  CT abdomen pelvis IMPRESSION: No acute intra-abdominal pathology identified. No definite radiographic explanation for the patient's reported symptoms.   Interval development of small bilateral pleural effusions with associated bibasilar compressive atelectasis.   Mild bladder distension, possibly related to voluntary retention.   Peripheral vascular disease with chronic thrombosis of left femoral bypass graft.   Aortic Atherosclerosis (ICD10-I70.0).  Eppie Gibson, MD 07/25/2021, 9:10 AM PGY-1, [place] Intern pager: [phone], text pages welcome

## 2021-07-25 NOTE — Progress Notes (Signed)
FPTS Brief Progress Note  S: Pt sleeping soundly. No concerns at this time.   O: BP (!) 151/70   Pulse 93   Temp 98.4 F (36.9 C) (Axillary)   Resp (!) 25   Ht '5\' 2"'$  (1.575 m)   Wt 64 kg   SpO2 94%   BMI 25.81 kg/m   General: lying in bed, NAD Respiratory: breathing comfortably on 2 L of oxygen, saturation of 92%  A/P: Continue to follow plan outlined in day teams progress note - Orders reviewed. Labs for AM ordered, which was adjusted as needed.    Precious Gilding, DO 07/25/2021, 1:10 AM PGY-1, [facility] Night Resident  Please page [phone] with questions.

## 2021-07-26 ENCOUNTER — Inpatient Hospital Stay (HOSPITAL_COMMUNITY): Payer: Medicare Other

## 2021-07-26 DIAGNOSIS — J189 Pneumonia, unspecified organism: Secondary | ICD-10-CM | POA: Diagnosis not present

## 2021-07-26 LAB — BASIC METABOLIC PANEL
Anion gap: 16 — ABNORMAL HIGH (ref 5–15)
BUN: 11 mg/dL (ref 8–23)
CO2: 22 mmol/L (ref 22–32)
Calcium: 8.9 mg/dL (ref 8.9–10.3)
Chloride: 103 mmol/L (ref 98–111)
Creatinine, Ser: 1.18 mg/dL — ABNORMAL HIGH (ref 0.44–1.00)
GFR, Estimated: 49 mL/min — ABNORMAL LOW (ref >60)
Glucose, Bld: 120 mg/dL — ABNORMAL HIGH (ref 70–99)
Potassium: 3.1 mmol/L — ABNORMAL LOW (ref 3.5–5.1)
Sodium: 141 mmol/L (ref 135–145)

## 2021-07-26 LAB — CBC
HCT: 39.4 % (ref 36.0–46.0)
Hemoglobin: 12.2 g/dL (ref 12.0–15.0)
MCH: 27.2 pg (ref 26.0–34.0)
MCHC: 31 g/dL (ref 30.0–36.0)
MCV: 87.8 fL (ref 80.0–100.0)
Platelets: 204 10*3/uL (ref 150–400)
RBC: 4.49 MIL/uL (ref 3.87–5.11)
RDW: 19.3 % — ABNORMAL HIGH (ref 11.5–15.5)
WBC: 29.4 10*3/uL — ABNORMAL HIGH (ref 4.0–10.5)
nRBC: 0.1 % (ref 0.0–0.2)

## 2021-07-26 LAB — GLUCOSE, CAPILLARY
Glucose-Capillary: 126 mg/dL — ABNORMAL HIGH (ref 70–99)
Glucose-Capillary: 154 mg/dL — ABNORMAL HIGH (ref 70–99)
Glucose-Capillary: 179 mg/dL — ABNORMAL HIGH (ref 70–99)
Glucose-Capillary: 154 mg/dL — ABNORMAL HIGH (ref 70–99)

## 2021-07-26 LAB — MAGNESIUM: Magnesium: 2 mg/dL (ref 1.7–2.4)

## 2021-07-26 MED ORDER — POTASSIUM CHLORIDE CRYS ER 20 MEQ PO TBCR: 40.0000 meq | EXTENDED_RELEASE_TABLET | Freq: Two times a day (BID) | ORAL | Status: DC

## 2021-07-26 MED ORDER — POTASSIUM CHLORIDE 20 MEQ PO PACK: 40.0000 meq | PACK | Freq: Two times a day (BID) | ORAL | Status: DC

## 2021-07-26 MED ORDER — POTASSIUM CHLORIDE CRYS ER 20 MEQ PO TBCR
40.0000 meq | EXTENDED_RELEASE_TABLET | Freq: Four times a day (QID) | ORAL | Status: AC
  Administered 2021-07-26: 40 meq via ORAL
  Filled 2021-07-26: qty 2

## 2021-07-26 MED ORDER — POTASSIUM CHLORIDE 20 MEQ PO PACK
40.0000 meq | PACK | Freq: Four times a day (QID) | ORAL | Status: AC
  Administered 2021-07-26: 40 meq via ORAL
  Filled 2021-07-26: qty 2

## 2021-07-26 MED ORDER — POTASSIUM & SODIUM PHOSPHATES 280-160-250 MG PO PACK
1.0000 | PACK | Freq: Two times a day (BID) | ORAL | Status: DC
  Administered 2021-07-26: 1 via ORAL
  Filled 2021-07-26 (×2): qty 1

## 2021-07-26 MED ORDER — MENTHOL 3 MG MT LOZG: 1.0000 | LOZENGE | OROMUCOSAL | Status: DC | PRN

## 2021-07-26 MED ORDER — PANTOPRAZOLE SODIUM 20 MG PO TBEC
20.0000 mg | DELAYED_RELEASE_TABLET | Freq: Every day | ORAL | Status: DC
  Administered 2021-07-26 – 2021-07-27 (×2): 20 mg via ORAL
  Filled 2021-07-26: qty 1

## 2021-07-26 MED ORDER — ADULT MULTIVITAMIN W/MINERALS CH
1.0000 | ORAL_TABLET | Freq: Every day | ORAL | Status: DC
  Administered 2021-07-26 – 2021-07-27 (×2): 1 via ORAL
  Filled 2021-07-26 (×2): qty 1

## 2021-07-26 MED ORDER — GUAIFENESIN 200 MG PO TABS
200.0000 mg | ORAL_TABLET | ORAL | Status: DC | PRN
  Administered 2021-07-26: 200 mg via ORAL
  Filled 2021-07-26 (×2): qty 1

## 2021-07-26 MED ORDER — GLUCERNA SHAKE PO LIQD
237.0000 mL | Freq: Three times a day (TID) | ORAL | Status: DC
  Administered 2021-07-26 – 2021-07-27 (×3): 237 mL via ORAL

## 2021-07-26 MED ORDER — ENSURE ENLIVE PO LIQD
237.0000 mL | Freq: Two times a day (BID) | ORAL | Status: DC
  Administered 2021-07-26: 237 mL via ORAL

## 2021-07-26 MED ORDER — BENZONATATE 100 MG PO CAPS
100.0000 mg | ORAL_CAPSULE | Freq: Two times a day (BID) | ORAL | Status: DC | PRN
  Administered 2021-07-26 (×2): 100 mg via ORAL
  Filled 2021-07-26 (×2): qty 1

## 2021-07-26 NOTE — Significant Event (Signed)
I have received word that there may be something wrong with our pagers. Just tested both the intern pager [phone]) and the upper level pager [phone]) from our work room phones, both pagers received the page without issue.   Unsure what the issue is. Will call IT for support.    Ezequiel Essex, MD PGY-2, [facility] Service pager [phone]

## 2021-07-26 NOTE — Progress Notes (Signed)
FPTS Brief Progress Note  S: Ms. Antoun says that she is feeling "so-so" today.  She says that her breathing and cough are improved from previous but that she is still having epigastric pain that is exacerbated by cough.  No other complaints.  I weaned her O2 to 2 L while in the room and she maintained O2 sats around 95%.   O: BP (!) 169/92 (BP Location: Left Arm)   Pulse 92   Temp 99.1 F (37.3 C) (Oral)   Resp 19   Ht '5\' 2"'$  (1.575 m)   Wt 64 kg   SpO2 96%   BMI 25.81 kg/m   General: Comfortable appearing lying in bed, NAD Pulm: Rhonchi throughout right greater than sign left, coughing during exam productive of blood-tinged sputum, normal work of breathing on 2 L nasal cannula Abdomen: Soft, diffusely tender to palpation worst in epigastrium, bowel sounds normal, nondistended   A/P: Ms. Tvedt is a 75 year old female admitted for CAP and H. influenzae bacteremia, receiving ceftriaxone (day 3/14).  Has a history of CLL on chemo, meningioma status postcraniotomy and resection, CVA, COPD, type 2 diabetes, HTN, HLD, PAD status post femoral-popliteal bypass and femoral endarterectomy.  Sepsis, resolved  CAP  H. influenzae bacteremia Continues to improve clinically.  Remains afebrile.  Her epigastric pain may be related to her longstanding GERD.  No evidence of gastritis on CT. -We will add Protonix once daily -Continue ceftriaxone (day 3/14) -Continue weaning supplemental O2 as tolerated - Orders reviewed. Labs for AM ordered, which were adjusted as needed.  - If her white count continues to rise but she continues to improve overall, we will consult her oncologist to consider whether her leukemia could be contributing to this abnormal lab value in the setting of overall clinical improvement.  Eppie Gibson, MD 07/26/2021, 9:42 PM PGY-1, [place] Night Resident  Please page [phone] with questions.

## 2021-07-26 NOTE — Progress Notes (Signed)
Family Medicine Teaching Service Daily Progress Note Intern Pager: [phone]  Patient name: [name] [name] Medical record number: [id number] Date of birth: 05-Sep-1946 Age: 75 y.o. Gender: female  Primary Care Provider: Delora Fuel, MD Consultants: None Code Status: Partial-DNI  Pt Overview and Major Events to Date:  7/28 admitted  Assessment and Plan: [name] [name] is a 75 year old female admitted with CAP and H. influenzae bacteremia, currently day #3/14 of antibiotics.  PMH includes CLL currently undergoing chemo, meningioma s/p craniotomy+resection, CVA, COPD, T2DM, HTN, HLD, PAD s/p fem-pop bypass+ femoral endarterectomy.  Antibiotic summary Vancomycin 7/28 Metronidazole 7/28 Cefepime 7/28 Ceftriaxone 2 g 7/29-current  Sepsis  CAP  H. Flu bacteremia Afebrile now for 48 hours.  Today is day #3/14 ceftriaxone.  WBC 29.4, increased from 26.5 yesterday.  Given clinical improvement and blood cultures positive only for H. influenzae, unsure if leukocytosis demonstrates need for broadening of coverage. - Continue ceftriaxone, total course of 14 days - O2 via [place] as needed for SPO2 >88% - Continue chest physiotherapy every 4 - Continue incentive spirometry every while awake - PT/OT eval and treat, place orders for home health versus SNF as indicated - Tylenol as needed for mild pain - Tramadol as needed for moderate pain - Morphine IV as needed for severe pain - Given progress towards discharge, will DC IV morphine 8/1 - If continued trajectory of clinical improvement, expect discharge in 1 to 3 days.   Abdominal Pain Much improved.  Eating a little bit.  Drinking normally without issue.  Continue as needed Tylenol, tramadol, morphine. - Tylenol as needed for mild pain - Tramadol as needed for moderate pain - Morphine IV as needed for severe pain -Given progress towards discharge, will DC IV morphine 8/1   CDK3a Creatinine 1.18, down from 1.28 yesterday.  Baseline on chart  review 1.5. - A.m. BMP   HFpEF  Hypertension Continue home amlodipine, hydralazine, Lasix.  BP last 24 hours ranging 154-195/79-117, most recently 165/80 and 185/29.  Overnight nurse and daytime nurse paged with concerns regarding hypertension, request PRN antihypertensives.  Given that patient is neurologically intact without new complaints or focal deficits, will opt to continue with home 3 agent regimen.  Of note, patient's vitals in clinic the past couple of months have hovered around 160/80.  Patient has received quite a bit of fluid since arriving to hospital with pneumonia and H. influenzae bacteremia. - Continue home amlodipine, hydralazine, Lasix - DC IV fluids given good p.o. fluid intake   COPD Continue home Breo and Singulair.   Diabetes Glucose checks last 24 hours range 119-154.  Patient has received 4 units insulin aspart. -Continue CBGs - Continue sensitive sliding scale insulin - We will switch Ensure for Glucerna for less effect on blood sugar   CLL Holding chemo per oncologist.  Will instruct on discharge to continue holding for couple weeks, until instructed to restart by oncologist.  Continue vitamins.   FEN/GI: Heart healthy carb modified PPx: Lovenox Dispo:Pending PT recommendations  in 2-3 days. Barriers include continued IV treatments.   Subjective:  Patient laying in bed, appears comfortable.  Breakfast tray in front of her.  Nurse at bedside says she does not eat very much at all but takes in great p.o. fluids.  Patient reports feeling okay and so-so this morning.  Says she slept so-so, her pain is so-so.  Reports relief with IV morphine she has been getting.  Patient reports breathing well, with reduction in abdominal pain.  No complaints at this  time.  Objective: Temp:  [99.1 F (37.3 C)-99.3 F (37.4 C)] 99.1 F (37.3 C) (07/31 1152) Pulse Rate:  [95-99] 96 (07/31 1152) Resp:  [18-23] 18 (07/31 1152) BP: (154-185)/(79-117) 154/83 (07/31 1152) SpO2:   [92 %-97 %] 95 % (07/31 1152) FiO2 (%):  [32 %] 32 % (07/31 0813) Physical Exam: General: Awake, alert, talkative and expressive Cardiovascular: Regular rate and rhythm, no murmurs auscultated Respiratory: Faint crackles in posterior lung bases  Extremities: 1+ pitting edema BLE  Laboratory: Recent Labs  Lab 07/24/21 0120 07/25/21 0142 07/26/21 0223  WBC 23.4* 26.5* 29.4*  HGB 11.8* 11.7* 12.2  HCT 37.6 37.5 39.4  PLT 183 165 204   Recent Labs  Lab 07/23/21 1153 07/24/21 0120 07/25/21 0142 07/26/21 0223  NA 140 143 142 141  K 3.2* 3.9 3.9 3.1*  CL 106 110 112* 103  CO2 24 21* 21* 22  BUN '15 19 17 11  '$ CREATININE 1.76* 1.51* 1.28* 1.18*  CALCIUM 9.1 8.7* 8.4* 8.9  PROT 6.0* 5.7*  --   --   BILITOT 2.1* 1.8*  --   --   ALKPHOS 188* 173*  --   --   ALT 38 40  --   --   AST 41 43*  --   --   GLUCOSE 111* 124* 95 120*    Imaging/Diagnostic Tests: PORTABLE CHEST 1 VIEW 07/26/21  COMPARISON:  07/23/2021 FINDINGS: Heart is normal size. Dense consolidation in the right upper lobe again noted. Bilateral lower lobe airspace opacities, right greater than left. Findings most compatible with pneumonia, not significantly changed since prior study. No effusions. No acute bony abnormality. IMPRESSION: Bilateral pneumonia, right greater than left. Recommend follow-up to Resolution   [name] Essex, MD 07/26/2021, 3:40 PM PGY-2, [place] Intern pager: [phone], text pages welcome

## 2021-07-26 NOTE — Progress Notes (Signed)
Notified family medicine resident of elevated BP with associated increased shortness of breath and increased RR and continued coughing spells. Was told "we actually do not give PRN BP meds are given unless BP 220 or higher so we are going to ride it out" and no orders received.   I gave 2 mg of morphine for headache/malaise patient explains as pain associated with coughing and to help with SOB. No order received for cough medicine. Also ordered neb tx and RT at bedside in agreement for concern for possible pulm edema. Turned off LR due to concern for this with multiple days of LR fluids and worsening shortness of breath and increasing BP. Will continue to monitor.

## 2021-07-27 ENCOUNTER — Inpatient Hospital Stay (HOSPITAL_COMMUNITY): Payer: Medicare Other

## 2021-07-27 ENCOUNTER — Other Ambulatory Visit (HOSPITAL_COMMUNITY): Payer: Self-pay

## 2021-07-27 DIAGNOSIS — R0902 Hypoxemia: Secondary | ICD-10-CM | POA: Diagnosis not present

## 2021-07-27 DIAGNOSIS — A419 Sepsis, unspecified organism: Secondary | ICD-10-CM | POA: Diagnosis not present

## 2021-07-27 DIAGNOSIS — G9340 Encephalopathy, unspecified: Secondary | ICD-10-CM | POA: Diagnosis not present

## 2021-07-27 DIAGNOSIS — J189 Pneumonia, unspecified organism: Secondary | ICD-10-CM | POA: Diagnosis not present

## 2021-07-27 LAB — BASIC METABOLIC PANEL
Anion gap: 12 (ref 5–15)
BUN: 12 mg/dL (ref 8–23)
CO2: 26 mmol/L (ref 22–32)
Calcium: 9.1 mg/dL (ref 8.9–10.3)
Chloride: 102 mmol/L (ref 98–111)
Creatinine, Ser: 1.03 mg/dL — ABNORMAL HIGH (ref 0.44–1.00)
GFR, Estimated: 57 mL/min — ABNORMAL LOW (ref >60)
Glucose, Bld: 191 mg/dL — ABNORMAL HIGH (ref 70–99)
Potassium: 3.8 mmol/L (ref 3.5–5.1)
Sodium: 140 mmol/L (ref 135–145)

## 2021-07-27 LAB — CBC
HCT: 39.1 % (ref 36.0–46.0)
Hemoglobin: 12.7 g/dL (ref 12.0–15.0)
MCH: 27.7 pg (ref 26.0–34.0)
MCHC: 32.5 g/dL (ref 30.0–36.0)
MCV: 85.4 fL (ref 80.0–100.0)
Platelets: 189 10*3/uL (ref 150–400)
RBC: 4.58 MIL/uL (ref 3.87–5.11)
RDW: 19.6 % — ABNORMAL HIGH (ref 11.5–15.5)
WBC: 26.4 10*3/uL — ABNORMAL HIGH (ref 4.0–10.5)
nRBC: 0.1 % (ref 0.0–0.2)

## 2021-07-27 LAB — GLUCOSE, CAPILLARY
Glucose-Capillary: 166 mg/dL — ABNORMAL HIGH (ref 70–99)
Glucose-Capillary: 227 mg/dL — ABNORMAL HIGH (ref 70–99)
Glucose-Capillary: 164 mg/dL — ABNORMAL HIGH (ref 70–99)

## 2021-07-27 LAB — MAGNESIUM: Magnesium: 2 mg/dL (ref 1.7–2.4)

## 2021-07-27 MED ORDER — TRAMADOL HCL 50 MG PO TABS
50.0000 mg | ORAL_TABLET | Freq: Two times a day (BID) | ORAL | 0 refills | Status: DC | PRN
  Filled 2021-07-27: qty 30, 15d supply, fill #0

## 2021-07-27 MED ORDER — ALLOPURINOL 300 MG PO TABS
150.0000 mg | ORAL_TABLET | Freq: Two times a day (BID) | ORAL | 1 refills | Status: DC
  Filled 2021-07-27: qty 30, 30d supply, fill #0

## 2021-07-27 MED ORDER — LIP MEDEX EX OINT
TOPICAL_OINTMENT | CUTANEOUS | Status: DC | PRN
  Filled 2021-07-27: qty 7

## 2021-07-27 MED ORDER — CEFDINIR 300 MG PO CAPS
300.0000 mg | ORAL_CAPSULE | Freq: Two times a day (BID) | ORAL | 0 refills | Status: AC
  Filled 2021-07-27: qty 10, 5d supply, fill #0

## 2021-07-27 MED ORDER — CEFDINIR 300 MG PO CAPS
300.0000 mg | ORAL_CAPSULE | Freq: Two times a day (BID) | ORAL | Status: DC
  Administered 2021-07-27: 300 mg via ORAL
  Filled 2021-07-27 (×2): qty 1

## 2021-07-27 MED ORDER — CEFDINIR 300 MG PO CAPS
300.0000 mg | ORAL_CAPSULE | Freq: Two times a day (BID) | ORAL | Status: DC
  Filled 2021-07-27 (×2): qty 1

## 2021-07-27 MED ORDER — ALBUTEROL SULFATE (2.5 MG/3ML) 0.083% IN NEBU
INHALATION_SOLUTION | Freq: Four times a day (QID) | RESPIRATORY_TRACT | 1 refills | Status: DC | PRN
  Filled 2021-07-27: qty 90, 8d supply, fill #0

## 2021-07-27 NOTE — Evaluation (Signed)
Physical Therapy Evaluation Patient Details Name: [name] [name] MRN: [id number] DOB: 03-30-1946 Today's Date: 07/27/2021   History of Present Illness  [name] [name] is a 75 y.o. female presenting with altered mental status and fever. PMH is significant for CLL, meningioma s/p craniotomy and resection, CVA, COPD, diabetes, hypertension, hyperlipidemia, peripheral vascular disease with a history of femoropopliteal bypass and femoral endarterectomy.   Clinical Impression  Patient received up in recliner, she reports she is not feeling well. Has sharp pain in right side at times. Patient is min guard for sit to stand and for ambulation in room with RW, slow pace.  She will continue to benefit from skilled PT while here to improve functional mobility and independence.    Follow Up Recommendations Home health PT    Equipment Recommendations  None recommended by PT    Recommendations for Other Services       Precautions / Restrictions Precautions Precautions: Fall Restrictions Weight Bearing Restrictions: No      Mobility  Bed Mobility               General bed mobility comments: patient received in recliner and remained in recliner    Transfers Overall transfer level: Needs assistance Equipment used: Rolling walker (2 wheeled) Transfers: Sit to/from Stand Sit to Stand: Min guard         General transfer comment: cues needed for safety  Ambulation/Gait Ambulation/Gait assistance: Min guard Gait Distance (Feet): 30 Feet Assistive device: Rolling walker (2 wheeled) Gait Pattern/deviations: Step-through pattern;Decreased step length - right;Decreased step length - left;Trunk flexed Gait velocity: decr   General Gait Details: steady balance with use of RW. Decreased activity tolerance  Stairs            Wheelchair Mobility    Modified Rankin (Stroke Patients Only)       Balance Overall balance assessment: Needs assistance Sitting-balance support:  Feet supported Sitting balance-Leahy Scale: Good     Standing balance support: Bilateral upper extremity supported;During functional activity Standing balance-Leahy Scale: Fair Standing balance comment: benefits from min guard, reliant on B UE support                             Pertinent Vitals/Pain Pain Assessment: Faces Faces Pain Scale: Hurts even more Pain Location: R side Pain Descriptors / Indicators: Stabbing;Discomfort;Grimacing;Guarding;Moaning Pain Intervention(s): Monitored during session    Home Living Family/patient expects to be discharged to:: Private residence Living Arrangements: Spouse/significant other Available Help at Discharge: Family;Available 24 hours/day Type of Home: House Home Access: Stairs to enter Entrance Stairs-Rails: None Entrance Stairs-Number of Steps: 3 Home Layout: One level Home Equipment: Walker - 4 wheels;Cane - single point;Shower seat      Prior Function Level of Independence: Independent with assistive device(s)         Comments: Patient uses 4WRW, patient drives.     Hand Dominance   Dominant Hand: Right    Extremity/Trunk Assessment   Upper Extremity Assessment Upper Extremity Assessment: Generalized weakness    Lower Extremity Assessment Lower Extremity Assessment: Generalized weakness    Cervical / Trunk Assessment Cervical / Trunk Assessment: Normal  Communication   Communication: No difficulties  Cognition Arousal/Alertness: Awake/alert Behavior During Therapy: WFL for tasks assessed/performed Overall Cognitive Status: Within Functional Limits for tasks assessed  General Comments      Exercises     Assessment/Plan    PT Assessment Patient needs continued PT services  PT Problem List Decreased strength;Decreased mobility;Decreased activity tolerance;Decreased balance;Pain;Decreased safety awareness       PT Treatment Interventions  Therapeutic exercise;Gait training;Functional mobility training;Therapeutic activities;Stair training;Patient/family education    PT Goals (Current goals can be found in the Care Plan section)  Acute Rehab PT Goals Patient Stated Goal: to feel better, return home PT Goal Formulation: With patient Time For Goal Achievement: 08/10/21 Potential to Achieve Goals: Good    Frequency Min 3X/week   Barriers to discharge        Co-evaluation               AM-PAC PT "6 Clicks" Mobility  Outcome Measure Help needed turning from your back to your side while in a flat bed without using bedrails?: A Little Help needed moving from lying on your back to sitting on the side of a flat bed without using bedrails?: A Little Help needed moving to and from a bed to a chair (including a wheelchair)?: A Little Help needed standing up from a chair using your arms (e.g., wheelchair or bedside chair)?: A Little Help needed to walk in hospital room?: A Little Help needed climbing 3-5 steps with a railing? : A Lot 6 Click Score: 17    End of Session Equipment Utilized During Treatment: Oxygen Activity Tolerance: Patient limited by fatigue Patient left: in chair;with call bell/phone within reach;with family/visitor present Nurse Communication: Mobility status PT Visit Diagnosis: Other abnormalities of gait and mobility (R26.89);Muscle weakness (generalized) (M62.81);Difficulty in walking, not elsewhere classified (R26.2);Pain Pain - Right/Left: Right Pain - part of body:  (side)    Time: [id number] PT Time Calculation (min) (ACUTE ONLY): 27 min   Charges:   PT Evaluation $PT Eval Moderate Complexity: 1 Mod PT Treatments $Gait Training: 8-22 mins        [employer], PT, GCS 07/27/21,12:12 PM

## 2021-07-27 NOTE — Discharge Summary (Addendum)
[facility] Discharge Summary  Patient name: [name] [name] Medical record number: [id number] Date of birth: Nov 16, 1946 Age: 75 y.o. Gender: female Date of Admission: 07/23/2021  Date of Discharge: 07/27/21 Admitting Physician: Zenia Resides, MD  Primary Care Provider: Delora Fuel, MD Consultants: None  Indication for Hospitalization: Sepsis  CAP  H. Flu bacteremia  Discharge Diagnoses/Problem List:  Active Problems:   Sepsis ([place])   Community acquired pneumonia of right lung   Hypoxia   Disposition: Home  Discharge Condition: Stable  Discharge Exam:  Blood pressure 132/81, pulse (!) 102, temperature 98.1 F (36.7 C), temperature source Oral, resp. rate 20, height _0  (1.575 m), weight 64 kg, SpO2 91 %.   Physical exam Gen: awake, not in acute distress CV: RRR, 2/6 systolic murmur appreciated Pulm: normal work of breathing on 2L [place], CTAB Abdominal: abdomen soft, nondistended, epigastric tenderness to palpation   Brief Hospital Course:  Ms. Sill is a 75 year old female diagnosed with CAP and H. influenzae bacteremia.  Past medical history of CLL (currently on chemo), meningioma s/p craniotomy and resection, CVA, COPD, T2DM, HTN, HLD, PAD s/p femoropopliteal bypass and femoral endarterectomy.   Sepsis and altered mental status Secondary to acute hypoxic respiratory distress Due to community-acquired pneumonia and bacteremia On arrival to the ED was febrile to 103.4, tachycardic to 106, tachypneic to 26, hypoxic to 89%.  Her lactic acid was 1.5 and WBC is 20.5.  She was moaning and responsive only to pain.  SPO2 improved with 4 L O2 via [place].  Chest x-ray showed dense airspace opacity within the right upper lobe extending into the right perihilar region concerning for pneumonia as well as bibasilar opacities.  She improved rapidly with administration of fluids and supplemental oxygen.  She was started on vancomycin, cefepime, Flagyl.  CT head  negative for acute hemorrhage or infarction. She was transitioned to ceftriaxone on 7/29 and discharged on cefdinir on 8/1 to finish a total 10 day course at home. DG Chest 1 view the day of discharge noted persistent dense airspace consolidation in the right upper lobe with interval improvement in bibasilar aeration.  Abdominal pain Patient with epigastric tenderness on admission, thought to be pleuritic in nature secondary to large pneumonia.  On hospital day #2, pain worsened.  Acute abdominal x-ray series without evidence of bowel perforation.  CT abdomen pelvis with contrast also negative for acute intra-abdominal pathology.  Patient pain improved by hospital day #3.  Hypertension Persistently hypertensive on admission with systolic BP 620-355'H. She was continued on her home BP medication. Asymptomatic.   Antibiotic summary Cefepime 7/28 Ceftriaxone 7/29-7/31 Metronidazole 7/28 Vancomycin 7/28 Cefdinir 8/1-8/6  Discharge recommendations Chemotherapy stopped during admission after conversation with her oncologist. Per onc, will need to hold chemo for a couple weeks after hospital; discharge. Recommend outpatient follow up with oncology.  Completion of Cefdinir on 8/6 Consider BP medication adjustment as necessary but keeping in mind that she is chronically ill and given her age, wouldn't want to have too strict control. BP systolic goal while inpatient was 160.   Significant Procedures: None  Significant Labs and Imaging:  Recent Labs  Lab 07/25/21 0142 07/26/21 0223 07/27/21 0054  WBC 26.5* 29.4* 26.4*  HGB 11.7* 12.2 12.7  HCT 37.5 39.4 39.1  PLT 165 204 189   Recent Labs  Lab 07/23/21 1153 07/24/21 0120 07/25/21 0142 07/26/21 0223 07/27/21 0054  NA 140 143 142 141 140  K 3.2* 3.9 3.9 3.1* 3.8  CL  106 110 112* 103 102  CO2 24 21* 21* 22 26  GLUCOSE 111* 124* 95 120* 191*  BUN _0 CREATININE 1.76* 1.51* 1.28* 1.18* 1.03*  CALCIUM 9.1 8.7* 8.4* 8.9 9.1   MG  --   --   --  2.0 2.0  ALKPHOS 188* 173*  --   --   --   AST 41 43*  --   --   --   ALT 38 40  --   --   --   ALBUMIN 3.0* 2.7*  --   --   --     Results/Tests Pending at Time of Discharge: None  Discharge Medications:  Allergies as of 07/27/2021       Reactions   Gazyva [obinutuzumab] Shortness Of Breath   Acute respiratory distress   Aspirin    Abdominal pain.   No [facility] Food Allergies Diarrhea, Nausea And Vomiting   Cabbage, stomach pain    Aspirin Other (See Comments)   irritates stomach   Baclofen Other (See Comments)   Stomach irritation        Medication List     STOP taking these medications    albuterol 0.63 MG/3ML nebulizer solution Commonly known as: ACCUNEB Replaced by: albuterol (2.5 MG/3ML) 0.083% nebulizer solution You also have another medication with the same name that you need to continue taking as instructed.   cyclobenzaprine 10 MG tablet Commonly known as: FLEXERIL   Venclexta 100 MG tablet Generic drug: venetoclax       TAKE these medications    acetaminophen 500 MG tablet Commonly known as: TYLENOL Take 500-1,000 mg by mouth every 6 (six) hours as needed for moderate pain.   albuterol 108 (90 Base) MCG/ACT inhaler Commonly known as: VENTOLIN HFA Inhale 2 puffs into the lungs every 6 (six) hours as needed for wheezing or shortness of breath. What changed:  Another medication with the same name was added. Make sure you understand how and when to take each. Another medication with the same name was removed. Continue taking this medication, and follow the directions you see here.   albuterol (2.5 MG/3ML) 0.083% nebulizer solution Commonly known as: PROVENTIL Take by nebulization every 6 (six) hours as needed for wheezing or shortness of breath. What changed: You were already taking a medication with the same name, and this prescription was added. Make sure you understand how and when to take each. Replaces: albuterol 0.63  MG/3ML nebulizer solution   Alcohol Pads 70 % Pads SMARTSIG:Pledget(s) Topical Daily   allopurinol 300 MG tablet Commonly known as: ZYLOPRIM Take 0.5 tablets (150 mg total) by mouth 2 (two) times daily. What changed: See the new instructions.   amLODipine 5 MG tablet Commonly known as: NORVASC Take 5 mg by mouth daily.   atorvastatin 80 MG tablet Commonly known as: LIPITOR Take 1 tablet by mouth every day   b complex vitamins capsule Take 1 capsule by mouth daily.   Breo Ellipta 100-25 MCG/INH Aepb Generic drug: fluticasone furoate-vilanterol Inhale 1 puff into the lungs daily.   cefdinir 300 MG capsule Commonly known as: OMNICEF Take 1 capsule (300 mg total) by mouth every 12 (twelve) hours for 5 days.   clopidogrel 75 MG tablet Commonly known as: PLAVIX Take 75 mg by mouth daily.   diclofenac Sodium 1 % Gel Commonly known as: VOLTAREN Apply 2 grams topically four times a day   DULoxetine 30 MG capsule Commonly known as: CYMBALTA Take 1 capsule (  30 mg total) by mouth daily.   Easy Mini Eject Lancing Device Misc daily.   famotidine 20 MG tablet Commonly known as: PEPCID Take 20 mg by mouth 2 (two) times daily.   furosemide 20 MG tablet Commonly known as: LASIX Take 1 tablet (20 mg total) by mouth daily.   gabapentin 300 MG capsule Commonly known as: NEURONTIN Take 600 mg by mouth 2 (two) times daily.   glucose blood test strip Commonly known as: OneTouch Verio Use to check blood sugar once a day   hydrALAZINE 50 MG tablet Commonly known as: APRESOLINE Take 1 tablet (50 mg total) by mouth 3 (three) times daily.   iron polysaccharides 150 MG capsule Commonly known as: NIFEREX Take 1 capsule (150 mg total) by mouth daily.   Jardiance 25 MG Tabs tablet Generic drug: empagliflozin Take 25 mg by mouth daily.   lidocaine 5 % ointment Commonly known as: XYLOCAINE Apply 1 application topically as needed. Apply to R>L feet when burning up to 4x/day as  needed   metFORMIN 500 MG tablet Commonly known as: GLUCOPHAGE Take 1 tablet (500 mg total) by mouth daily.   montelukast 10 MG tablet Commonly known as: SINGULAIR Take 1 tablet (10 mg total) by mouth at bedtime.   nitroGLYCERIN 0.4 MG SL tablet Commonly known as: NITROSTAT Dissolve 1 tablet under tongue every 5 minutes, up to 3 doses for chest pain   onetouch ultrasoft lancets Use to check blood sugar once a day   OneTouch Verio w/Device Kit 1 Device by Does not apply route daily.   OT ULTRA/FASTTK CNTRL SOLN Soln See admin instructions.   traMADol 50 MG tablet Commonly known as: ULTRAM Take 1 tablet (50 mg total) by mouth 2 (two) times daily as needed for moderate pain. Will prescribe 1 months supply.  Please see me in clinic in the next month for further refills.   Trulicity 6.81 LX/7.2IO Sopn Generic drug: Dulaglutide Inject 0.75 mg into the skin once a week.               Durable Medical Equipment  (From admission, onward)           Start     Ordered   07/27/21 1234  For home use only DME oxygen  Once       Comments: Imminent discharge  Question Answer Comment  Length of Need 6 Months   Mode or (Route) Nasal cannula   Frequency Continuous (stationary and portable oxygen unit needed)   Oxygen delivery system Gas      07/27/21 1233            Discharge Instructions: Please refer to Patient Instructions section of EMR for full details.  Patient was counseled important signs and symptoms that should prompt return to medical care, changes in medications, dietary instructions, activity restrictions, and follow up appointments.   Follow-Up Appointments:  Follow-up Information     Sueanne Margarita, MD .   Specialty: Cardiology Contact information: [address] [place] [address] [place]. Go on 08/03/2021.   Why: Please go to your scheduled appointment at 8:50AM. Please arrive at  least 15 minutes early. Contact information: [place] [facility] Follow up.   Why: Oxygen to be delivered to the home prior to transition home. Contact information: [place]  [place] [address] [place], [place], DO 07/27/2021, 2:26 PM PGY-2, [place]

## 2021-07-27 NOTE — Discharge Instructions (Addendum)
Dear [name] [name],  Thank you for letting us participate in your care. You were hospitalized for shortness of breath and diagnosed with pneumonia and found to have bacteria in your bloodstream. You were treated with antibiotics and breathing treatments. We are glad that you are doing better.   POST-HOSPITAL & CARE INSTRUCTIONS Continue your antibiotics as prescribed, your last dose should be on 8/6.  You should resume your chemotherapy in 1 month. Follow up with your oncologist. Go to your follow up appointments (listed below)   DOCTOR'S APPOINTMENT   Future Appointments  Date Time Provider [place]  08/03/2021  8:50 AM ACCESS TO CARE POOL [facility] [place]  08/24/2021 10:00 AM [facility]-CV HS VASC 6 - [place] [facility]  08/24/2021 10:30 AM [facility]-CV HS VASC 6 - [place] [facility] V[facility]  08/24/2021 11:15 AM [facility] PA [facility]  09/14/2021  8:45 AM [facility] LAB [facility] None  09/14/2021  9:20 AM Brunetta Genera, MD [facility] None  10/21/2021  8:40 AM Sueanne Margarita, MD [facility]    Follow-up Information     Sueanne Margarita, MD .   Specialty: Cardiology Contact information: [address] [place] [address] [place]. Go on 08/03/2021.   Why: Please go to your scheduled appointment at 8:50AM. Please arrive at least 15 minutes early. Contact information: [place]                Take care and be well!  [facility]  [place], [place] [address] [phone]

## 2021-07-27 NOTE — TOC Initial Note (Signed)
Transition of Care [facility]) - Initial/Assessment Note    Patient Details  Name: [name] [name] MRN: [id number] Date of Birth: December 09, 1946  [facility] CM/SW Contact:    Bethena Roys, RN Phone Number: 07/27/2021, 5:03 PM  Clinical Narrative: Risk for readmission assessment completed. Patient was previously home with family support and will return home with home health services. Patient has used home health services in the past. Patient does not have a preference for home health agency. Case Manager called [facility] and they are able to accept the patient for home health services. Start of care to begin within 24-48 hours post transition home. Case Manager ordered oxygen and a rolling walker via [employer]. Durable medical equipment was sent to the room prior to transition home. Family to provide transportation home.            Expected Discharge Plan: [place] Barriers to Discharge: No Barriers Identified   Patient Goals and CMS Choice Patient states their goals for this hospitalization and ongoing recovery are:: to return home   Choice offered to / list presented to : NA (The patient did not have a preference.)  Expected Discharge Plan and Services Expected Discharge Plan: [place] In-house Referral: NA Discharge Planning Services: CM Consult Post Acute Care Choice: Home Health, Durable Medical Equipment Living arrangements for the past 2 months: Single Family Home Expected Discharge Date: 07/27/21               DME Arranged: Oxygen DME Agency: [employer] Date DME Agency Contacted: 07/27/21 Time DME Agency Contacted: 1400 Representative spoke with at DME Agency: Freda Munro HH Arranged: PT, OT [place] Agency: [place] Date [place]: 07/27/21 Time [place]: 1640 Representative spoke with at [place]: Levada Dy  Prior Living Arrangements/Services Living arrangements for the past 2 months: [place] with:: Spouse Patient language and need for interpreter reviewed:: Yes Do you feel safe going back to the place where you live?: Yes      Need for Family Participation in Patient Care: Yes (Comment) Care giver support system in place?: Yes (comment) Current home services: DME (Patient has a cane and rolling walker) Criminal Activity/Legal Involvement Pertinent to Current Situation/Hospitalization: No - Comment as needed  Activities of Daily Living Home Assistive Devices/Equipment: Cane (specify quad or straight), Walker (specify type) ADL Screening (condition at time of admission) Patient's cognitive ability adequate to safely complete daily activities?: Yes Is the patient deaf or have difficulty hearing?: No Does the patient have difficulty seeing, even when wearing glasses/contacts?: No Does the patient have difficulty concentrating, remembering, or making decisions?: No Patient able to express need for assistance with ADLs?: Yes Does the patient have difficulty dressing or bathing?: No Independently performs ADLs?: Yes (appropriate for developmental age) Does the patient have difficulty walking or climbing stairs?: No Weakness of Legs: None Weakness of Arms/Hands: None  Permission Sought/Granted Permission sought to share information with : Family Supports, Customer service manager, Case Optician, dispensing granted to share information with : Yes, Verbal Permission Granted     Permission granted to share info w AGENCY: [employer], [place]        Emotional Assessment Appearance:: Appears stated age Attitude/Demeanor/Rapport: Engaged Affect (typically observed): Appropriate Orientation: : Oriented to Situation, Oriented to  Time, Oriented to Place, Oriented to Self Alcohol / Substance Use: Not Applicable Psych Involvement: No (comment)  Admission diagnosis:  Encephalopathy [G93.40] Hypoxia [R09.02] Sepsis ([place]) [A41.9] Community  acquired pneumonia of  right lung, unspecified part of lung [J18.9] Sepsis with encephalopathy without septic shock, due to unspecified organism ([place]) [A41.9, R65.20, G93.40] Patient Active Problem List   Diagnosis Date Noted   Sepsis ([place]) 07/23/2021   Community acquired pneumonia of right lung    Hypoxia    Fall 05/04/2021   Finger pain, right 05/04/2021   Counseling regarding advance care planning and goals of care 02/19/2021   Iron deficiency anemia 02/12/2021   Peripheral arterial disease ([place]) 10/03/2020   (HFpEF) heart failure with preserved ejection fraction ([place]) 06/11/2020   Venous insufficiency (chronic) (peripheral) 06/22/2019   Pulmonary nodules 01/04/2018   Mass of left lower leg 01/04/2018   Chronic kidney disease (CKD), stage III (moderate) ([place]) 06/04/2016   Chronic back pain 10/02/2015   DM (diabetes mellitus), type 2 with peripheral vascular complications ([place]) [id number]   Chronic lymphocytic leukemia ([place]) 04/11/2009   PERIPHERAL VASCULAR DISEASE 10/13/2007   HYPERCHOLESTEROLEMIA 02/23/2007   ANXIETY 02/23/2007   TOBACCO DEPENDENCE 02/23/2007   Essential hypertension 02/23/2007   COPD (chronic obstructive pulmonary disease) ([place]) 02/23/2007   GASTROESOPHAGEAL REFLUX, NO ESOPHAGITIS 02/23/2007   PCP:  Delora Fuel, MD Pharmacy:   [facility], [place] [address] [place] [address] Phone: [phone] Fax: [place] [address] [place] [address] Phone: [phone] Fax: [phone]  [facility] [address] [place] [address] Phone: [phone] Fax: [phone]     Social Determinants of Health (SDOH) Interventions    Readmission Risk Interventions Readmission Risk Prevention Plan 07/27/2021 10/08/2020  Transportation Screening Complete Complete  PCP or Specialist Appt within 3-5 Days Complete Complete  HRI or [place] Complete Complete  Social Work  Consult for [place] Planning/Counseling Complete Complete  Palliative Care Screening Not Applicable Not Applicable  Medication Review Press photographer) Complete Complete  Some recent data might be hidden

## 2021-07-27 NOTE — Progress Notes (Signed)
SATURATION QUALIFICATIONS: (This note is used to comply with regulatory documentation for home oxygen)  Patient Saturations on Room Air at Rest = 90%  Patient Saturations on Room Air while Ambulating = 88%  Patient Saturations on 2Liters of oxygen while Ambulating = 92%

## 2021-07-27 NOTE — Progress Notes (Signed)
Pt discharged per MD order. Discharge instructions reviewed with pt. Pt verbalized understanding and has no further questions at this time. Pt received home oxygen and will be transported home via personal vehicle.

## 2021-07-27 NOTE — Plan of Care (Signed)
  Problem: Activity: Goal: Ability to return to baseline activity level will improve Outcome: Progressing   

## 2021-07-27 NOTE — Evaluation (Signed)
Occupational Therapy Evaluation Patient Details Name: [name] [name] MRN: [id number] DOB: 11/07/1946 Today's Date: 07/27/2021    History of Present Illness [name] [name] is a 75 y.o. female presenting with altered mental status and fever. PMH is significant for CLL, meningioma s/p craniotomy and resection, CVA, COPD, diabetes, hypertension, hyperlipidemia, peripheral vascular disease with a history of femoropopliteal bypass and femoral endarterectomy.   Clinical Impression   Pt. Has decreased I and safty with ADLs and mobility. Pt. Is currently s to Min guard assist with ADLs and transfers. Pt. Was cooperative with therapy and will be dc home today. Pt. Is an agreement with hhot.     Follow Up Recommendations  Home health OT    Equipment Recommendations  None recommended by OT    Recommendations for Other Services       Precautions / Restrictions Precautions Precautions: Fall Restrictions Weight Bearing Restrictions: No      Mobility Bed Mobility               General bed mobility comments: patient received in recliner and remained in recliner    Transfers Overall transfer level: Needs assistance Equipment used: Rolling walker (2 wheeled) Transfers: Sit to/from Stand Sit to Stand: Min guard         General transfer comment: cues needed for safety    Balance Overall balance assessment: Needs assistance Sitting-balance support: Feet supported Sitting balance-Leahy Scale: Good     Standing balance support: Bilateral upper extremity supported;During functional activity Standing balance-Leahy Scale: Fair Standing balance comment: benefits from min guard, reliant on B UE support                           ADL either performed or assessed with clinical judgement   ADL Overall ADL's : Needs assistance/impaired Eating/Feeding: Independent   Grooming: Wash/dry hands;Wash/dry face;Min guard;Standing   Upper Body Bathing: Supervision/  safety;Sitting   Lower Body Bathing: Min guard;Sit to/from stand   Upper Body Dressing : Set up;Sitting   Lower Body Dressing: Min guard;Sit to/from stand   Toilet Transfer: Min guard;RW   Toileting- Water quality scientist and Hygiene: Min guard;Sit to/from stand       Functional mobility during ADLs: Min guard;Rolling walker General ADL Comments: Pt uses cross leg technique for LE dressing.     Vision Baseline Vision/History: Wears glasses;Cataracts (not ready for sx yet) Wears Glasses: At all times Patient Visual Report: No change from baseline Vision Assessment?: No apparent visual deficits     Perception     Praxis      Pertinent Vitals/Pain Pain Assessment: 0-10 Pain Score: 3  Faces Pain Scale: Hurts even more Pain Location: R side Pain Descriptors / Indicators: Aching Pain Intervention(s): Patient requesting pain meds-RN notified     Hand Dominance Right   Extremity/Trunk Assessment Upper Extremity Assessment Upper Extremity Assessment: Overall WFL for tasks assessed   Lower Extremity Assessment Lower Extremity Assessment: Defer to PT evaluation   Cervical / Trunk Assessment Cervical / Trunk Assessment: Normal   Communication Communication Communication: No difficulties   Cognition Arousal/Alertness: Awake/alert Behavior During Therapy: WFL for tasks assessed/performed Overall Cognitive Status: Within Functional Limits for tasks assessed                                     General Comments       Exercises  Shoulder Instructions      Home Living Family/patient expects to be discharged to:: Private residence Living Arrangements: Spouse/significant other Available Help at Discharge: Family;Available 24 hours/day Type of Home: House Home Access: Stairs to enter [employer] of Steps: 3 Entrance Stairs-Rails: None Home Layout: One level     Bathroom Shower/Tub: Occupational psychologist: Handicapped  height     Home Equipment: Environmental consultant - 4 wheels;Cane - single point;Shower seat   Additional Comments: Pt. states she ordered toilet riser and it has not come in yet from [facility].      Prior Functioning/Environment Level of Independence: Independent with assistive device(s)        Comments: Patient uses 4WRW, patient drives.        OT Problem List: Decreased strength;Decreased activity tolerance;Decreased knowledge of use of DME or AE      OT Treatment/Interventions: Self-care/ADL training;DME and/or AE instruction;Therapeutic activities;Patient/family education    OT Goals(Current goals can be found in the care plan section) Acute Rehab OT Goals Patient Stated Goal: go home today. OT Goal Formulation: With patient  OT Frequency: Min 2X/week   Barriers to D/C:            Co-evaluation              AM-PAC OT "6 Clicks" Daily Activity     Outcome Measure Help from another person eating meals?: None Help from another person taking care of personal grooming?: A Little Help from another person toileting, which includes using toliet, bedpan, or urinal?: A Little Help from another person bathing (including washing, rinsing, drying)?: A Little Help from another person to put on and taking off regular upper body clothing?: A Little Help from another person to put on and taking off regular lower body clothing?: A Little 6 Click Score: 19   End of Session Equipment Utilized During Treatment: Rolling walker Nurse Communication:  (ok therapy)  Activity Tolerance: Patient tolerated treatment well Patient left: in chair;with chair alarm set;with call bell/phone within reach;with family/visitor present  OT Visit Diagnosis: Unsteadiness on feet (R26.81);Repeated falls (R29.6)                Time: [id number] OT Time Calculation (min): 33 min Charges:  OT General Charges $OT Visit: 1 Visit OT Evaluation $OT Eval Moderate Complexity: 1 Mod  Rmoni Keplinger  OT/L   Pawan Knechtel 07/27/2021, 1:46 PM

## 2021-07-28 LAB — CULTURE, BLOOD (ROUTINE X 2): Special Requests: ADEQUATE

## 2021-07-30 ENCOUNTER — Telehealth: Payer: Self-pay

## 2021-07-30 NOTE — Telephone Encounter (Signed)
Raj from [facility] calling for PT verbal orders as follows:  2 time(s) weekly for 4 week(s), then 1 time(s) weekly for 4 week(s)  Verbal orders given per [facility] protocol  Talbot Grumbling, RN

## 2021-08-02 NOTE — Assessment & Plan Note (Addendum)
Recommend patient contacts her oncologist Dr. Irene Limbo regarding restarting chemotherapy and moving her outpatient appointment in September sooner.  Current follow-up appointment is on 09/14/21.

## 2021-08-02 NOTE — Progress Notes (Addendum)
SUBJECTIVE:   CHIEF COMPLAINT / HPI:   [name] [name] is a 75 y.o. female presents for hospital follow up   Acute hypoxic respiratory failure 2/2 PNA  H. Influenza bacteremia Treated with Cefepime, Ceftriaxone, Metronidazole,Vancomycin and Cefdinir (discharged on total 10 day course to complete at home) for CAP. Presents today for follow up. Complaint with Cefdinir and has finished the course. Reports improvement in breathing however still requiring 2L oxygen. Feels an improvement when she is on the oxygen. Sats 87% on air in the office. Sats 97% on 2L  sometimes does not use the oxygen all the times.  Denies dyspnea, chest pain, dizziness, palpitations or sweating.  Patient is requesting home DME oxygen.  Chronic back pain Takes tramadol '50mg'$  in the evenings which is effective.  This is also what she received in the hospital as well.  Hypertension Patient's current antihypertensive  medications include: Amlodipine, hydralazine and lasix. Compliant with medications and tolerating well without side effects. Usually check her Bps at home but has not checked it recently. BP on discharge was 132/81.  Denies any SOB, CP, vision changes, LE edema, medication SEs, or symptoms of hypotension.   Most recent creatinine trend:  Lab Results  Component Value Date   CREATININE 1.03 (H) 07/27/2021   CREATININE 1.18 (H) 07/26/2021   CREATININE 1.28 (H) 07/25/2021    Patient has had a BMP in the past 1 year.  Chemotherapy Usually takes 200 mg venetoclax which was held in hospital per oncology. Pt has appointment on 09/14/21. [place] Dr Irene Limbo.   Upper abdominal pain Patient reports ongoing epigastric pain, thought to be pleuritic in nature secondary to large pneumonia in the hospital.  CT abdomen pelvis in hospital negative for acute intra-abdominal pathology.  Her pain did improve throughout her hospital course.  Patient reports tramadol 50 mg helps with this pain and also leaning over a chair  while she is breathing also helps.  [place] Office Visit from 08/03/2021 in [place]  PHQ-9 Total Score 0        PERTINENT  PMH / PSH: CLL, hypertension, back pain, pneumonia  OBJECTIVE:   BP (!) 128/58   Pulse 82   Ht '5\' 2"'$  (1.575 m)   Wt 133 lb 9.6 oz (60.6 kg)   SpO2 93% Comment: '@2L'$   BMI 24.44 kg/m     Repeat BP 130/60  General: Alert, no acute distress, 2L oxygen nasal cannula, pleasant Cardio: Normal S1 and S2, RRR, no r/m/g Pulm: CTAB, normal work of breathing Abdomen: Bowel sounds normal. Abdomen soft and non-tender.  Extremities: 2+ peripheral edema right LE which is chronic post surgery  Neuro: Cranial nerves grossly intact   ASSESSMENT/PLAN:   Chronic lymphocytic leukemia ([place]) Recommend patient contacts her oncologist Dr. Irene Limbo regarding restarting chemotherapy and moving her outpatient appointment in September sooner.  Current follow-up appointment is on 09/14/21.  Essential hypertension Blood pressure is low-normal today. BP 128/58, repeat 130/60.  Her Bps were higher in the hospital in [id number] systolic. Goal >140/90 given age. Recommended patient stops amlodipine. Can recheck blood pressure next week at follow-up.  Community acquired pneumonia of right lung Stable and improved.  Patient has finished course of cefdinir. She is currently on 2 L of oxygen at home.  Recommend that she continues oxygen at home with oxygen sats goal> 88% given history of COPD.  She will try to buy a pulse ox at home and titrate her oxygen down over the next  week as needed with above oxygen sats goal.  Home DME oxygen order placed per patient's request.  Explained that she will probably only need oxygen for a short period time.  Follow-up with Dr. Rock Nephew in 1 week.   Pleuritic chest pain Stable, improving.  Recommends patient takes tramadol 50 mg twice daily which is what was prescribed on her discharge summary.  It was explained to patient that it will take  some time for her pneumonia to resolve and that this pain may go on for a few more weeks.  Continue to monitor symptoms.   Recommended patient brings all of her medications to next visit.  Lattie Haw, MD PGY-3 [place]

## 2021-08-03 ENCOUNTER — Telehealth: Payer: Self-pay | Admitting: Hematology

## 2021-08-03 ENCOUNTER — Ambulatory Visit (INDEPENDENT_AMBULATORY_CARE_PROVIDER_SITE_OTHER): Payer: Medicare Other | Admitting: Family Medicine

## 2021-08-03 ENCOUNTER — Other Ambulatory Visit: Payer: Self-pay

## 2021-08-03 ENCOUNTER — Telehealth: Payer: Self-pay | Admitting: *Deleted

## 2021-08-03 DIAGNOSIS — C9110 Chronic lymphocytic leukemia of B-cell type not having achieved remission: Secondary | ICD-10-CM

## 2021-08-03 DIAGNOSIS — I10 Essential (primary) hypertension: Secondary | ICD-10-CM

## 2021-08-03 DIAGNOSIS — R0781 Pleurodynia: Secondary | ICD-10-CM | POA: Diagnosis not present

## 2021-08-03 DIAGNOSIS — J189 Pneumonia, unspecified organism: Secondary | ICD-10-CM

## 2021-08-03 MED ORDER — VENETOCLAX 100 MG PO TABS
200.0000 mg | ORAL_TABLET | Freq: Every day | ORAL | 2 refills | Status: DC
  Filled 2021-08-03 – 2021-08-05 (×2): qty 60, 30d supply, fill #0
  Filled 2021-08-28: qty 60, 30d supply, fill #1
  Filled 2021-09-21: qty 60, 30d supply, fill #2

## 2021-08-03 NOTE — Telephone Encounter (Signed)
Scheduled appts per 8/8 sch msg. Pt aware.  

## 2021-08-03 NOTE — Telephone Encounter (Signed)
Patient called with question re resuming Venetoclax.   Diagnosis CLL. Recent hospitalization for Sepsis/Pneumonia R lung 7/28-8/1. Was taking Venetoclax 100 mg daily prior to inpatient stay. It was held during hospital stay. She was advised to continue holding when d/c by hospitalist until she contacted Dr. Irene Limbo.  She would like to know when she can resume taking Venetoclax. Her last appt with Dr. Irene Limbo 7/19 -- next is 9/19. Question given to Dr. Lorenso Courier in Dr. Grier Mitts absence; Contacted patient with Dr. Libby Maw response: "Once she has completed her antibiotics she can restart the venetoclax. I would recommend an ealier f/u visit with Dr. Irene Limbo (in 2 -3 weeks)". Patient verbalized understanding of all information.  Schedule message sent to schedule patient for labs and with Dr. Irene Limbo in 2-3 weeks.

## 2021-08-03 NOTE — Assessment & Plan Note (Signed)
Stable, improving.  Recommends patient takes tramadol 50 mg twice daily which is what was prescribed on her discharge summary.  It was explained to patient that it will take some time for her pneumonia to resolve and that this pain may go on for a few more weeks.  Continue to monitor symptoms.

## 2021-08-03 NOTE — Assessment & Plan Note (Addendum)
Blood pressure is low-normal today. BP 128/58, repeat 130/60.  Her Bps were higher in the hospital in [id number] systolic. Goal >140/90 given age. Recommended patient stops amlodipine. Can recheck blood pressure next week at follow-up.

## 2021-08-03 NOTE — Patient Instructions (Signed)
Thank you for coming to see me today. It was a pleasure. Today we discussed your breathing, your lungs sounds good. Continue using the oxygen at home.   For the belly pain, continue tramadol twice a day.  STOP taking Amlodipine/Norvasc as your blood pressure is low/normal.   Call your oncologist about restarting the chemo medicine and clinic visit.  I recommend coming in for a follow up next week in the clinic to check your breathing.    If you have any questions or concerns, please do not hesitate to call the office at [phone].  Best wishes,   Dr Posey Pronto

## 2021-08-03 NOTE — Assessment & Plan Note (Addendum)
Stable and improved.  Patient has finished course of cefdinir. She is currently on 2 L of oxygen at home.  Recommend that she continues oxygen at home with oxygen sats goal> 88% given history of COPD.  She will try to buy a pulse ox at home and titrate her oxygen down over the next week as needed with above oxygen sats goal.  Home DME oxygen order placed per patient's request.  Explained that she will probably only need oxygen for a short period time.  Follow-up with Dr. Rock Nephew in 1 week.

## 2021-08-04 ENCOUNTER — Other Ambulatory Visit (HOSPITAL_COMMUNITY): Payer: Self-pay

## 2021-08-05 ENCOUNTER — Other Ambulatory Visit (HOSPITAL_COMMUNITY): Payer: Self-pay

## 2021-08-10 ENCOUNTER — Encounter: Payer: Self-pay | Admitting: Family Medicine

## 2021-08-10 ENCOUNTER — Ambulatory Visit (INDEPENDENT_AMBULATORY_CARE_PROVIDER_SITE_OTHER): Payer: Medicare Other | Admitting: Family Medicine

## 2021-08-10 ENCOUNTER — Other Ambulatory Visit: Payer: Self-pay

## 2021-08-10 VITALS — BP 108/58 | HR 74 | Ht 62.0 in | Wt 132.2 lb

## 2021-08-10 DIAGNOSIS — I10 Essential (primary) hypertension: Secondary | ICD-10-CM

## 2021-08-10 DIAGNOSIS — J189 Pneumonia, unspecified organism: Secondary | ICD-10-CM

## 2021-08-10 LAB — CULTURE, BLOOD (ROUTINE X 2): Special Requests: ADEQUATE

## 2021-08-10 MED ORDER — HYDRALAZINE HCL 25 MG PO TABS
25.0000 mg | ORAL_TABLET | Freq: Three times a day (TID) | ORAL | 0 refills | Status: DC
Start: 2021-08-10 — End: 2021-08-24

## 2021-08-10 NOTE — Patient Instructions (Addendum)
It was great to meet you!  Things we discussed: - We are going to decrease your hydralazine to '25mg'$  three times per day (instead of '50mg'$ ).  - Please bring a written log of your blood pressure readings to your next appointment.  - Always bring all your medications to each visit - Your oxygen levels were excellent today, even when you were walking without the oxygen. If you feel short of breath at home, you can check your oxygen level. Only use the oxygen if its below 88%. - I'd like to get a chest x-ray to make sure your pneumonia has resolved  Take care and seek immediate care sooner if you develop any concerns.  Dr. [facility]

## 2021-08-10 NOTE — Progress Notes (Signed)
    SUBJECTIVE:   CHIEF COMPLAINT / HPI:   Pneumonia follow-up Patient was hospitalized at the end of July for acute hypoxic respiratory failure secondary to pneumonia. Still required 2L O2 at her hospital follow up visit on 08/03/2021. At that time she was advised to check home O2 sats and wean off O2 as tolerated. Today she reports her breathing has been "ok". Better than before but has tried taking her oxygen off and gets short of breath if she walks without it. Has never checked her pulse ox without the oxygen. Checks it with the oxygen and its usually 97%. Continues to smoke ~4 cigarettes per day.  HTN follow up Current regimen: hydralazine '50mg'$  TID, Lasix '20mg'$  daily Previously on amlodipine as well, which was discontinued during her hospital follow up visit on 08/03/2021 due to low-normal BP. Patient reports she does check her BP at home, however she is not sure of any of the numbers and doesn't have log with her. Occasionally feels woozy if she stands for too long but otherwise no dizziness, lightheadedness, or other symptoms of hypotension.  PERTINENT  PMH / PSH: CLL, COPD, HTN, back pain  OBJECTIVE:   BP (!) 108/58   Pulse 74   Ht '5\' 2"'$  (1.575 m)   Wt 132 lb 3.2 oz (60 kg)   SpO2 97% Comment: '@2L'$   BMI 24.18 kg/m   General: NAD, pleasant, able to participate in exam Cardiac: RRR, S1 S2 present. normal heart sounds, no murmurs. Respiratory: normal effort on 2L and on room air, significantly decreased breath sounds on right. No wheezes, rales or rhonchi Skin: warm and dry, no rashes noted Neuro: alert, no obvious focal deficits Psych: Normal affect and mood   ASSESSMENT/PLAN:   Community acquired pneumonia of right lung Stable and improved. She finished course of antibiotics 1 week ago. Her O2 sat remained 96% or above while ambulating on room air in clinic today. Advised patient she no longer needs to use oxygen. Recommended she check her pulse ox if feeling short of breath  and can use O2 if sats <88%. She does have significantly decreased breath sounds on right, so will obtain CXR to ensure resolution of her pneumonia. Encouraged smoking cessation but patient is not ready   Essential hypertension BP remains on the lower side today (108/58) despite stopping amlodipine. No need for such tight BP control in patient her age. Will decrease hydralazine from '50mg'$  TID to '25mg'$  TID. Advised to continue checking BP at home and bring written log to next appointment.   Alcus Dad, MD [place]

## 2021-08-11 ENCOUNTER — Other Ambulatory Visit: Payer: Self-pay | Admitting: Family Medicine

## 2021-08-11 DIAGNOSIS — I10 Essential (primary) hypertension: Secondary | ICD-10-CM

## 2021-08-11 NOTE — Assessment & Plan Note (Addendum)
BP remains on the lower side today (108/58) despite stopping amlodipine. No need for such tight BP control in patient her age. Will decrease hydralazine from '50mg'$  TID to '25mg'$  TID. Advised to continue checking BP at home and bring written log to next appointment.

## 2021-08-11 NOTE — Assessment & Plan Note (Addendum)
Stable and improved. She finished course of antibiotics 1 week ago. Her O2 sat remained 96% or above while ambulating on room air in clinic today. Advised patient she no longer needs to use oxygen. Recommended she check her pulse ox if feeling short of breath and can use O2 if sats <88%. She does have significantly decreased breath sounds on right, so will obtain CXR to ensure resolution of her pneumonia. Encouraged smoking cessation but patient is not ready

## 2021-08-12 ENCOUNTER — Other Ambulatory Visit (HOSPITAL_COMMUNITY): Payer: Self-pay

## 2021-08-14 ENCOUNTER — Other Ambulatory Visit: Payer: Self-pay

## 2021-08-14 ENCOUNTER — Other Ambulatory Visit (HOSPITAL_COMMUNITY): Payer: Self-pay

## 2021-08-14 NOTE — Telephone Encounter (Signed)
Pt has an appt with Dr. Rock Nephew on 08/28/2021. Ottis Stain, CMA

## 2021-08-17 ENCOUNTER — Telehealth: Payer: Self-pay | Admitting: Family Medicine

## 2021-08-17 ENCOUNTER — Ambulatory Visit
Admission: RE | Admit: 2021-08-17 | Discharge: 2021-08-17 | Disposition: A | Discharge status: Home / Self Care | Payer: Medicare Other | Source: Ambulatory Visit | Attending: Family Medicine | Admitting: Family Medicine

## 2021-08-17 DIAGNOSIS — J189 Pneumonia, unspecified organism: Secondary | ICD-10-CM

## 2021-08-17 NOTE — Telephone Encounter (Signed)
Attempted to call patient regarding results of CXR, which show improvement in the pneumonia; no answer and voicemail was left to call the office back.  Patient has an upcoming appointment with Dr. Rock Nephew on 9/2 and will consider if there is a need to get repeat imaging given patient's smoking history (per radiology report would consider repeat in 4-6 weeks).   Harold Moncus, DO

## 2021-08-20 NOTE — Telephone Encounter (Signed)
Patient returns call to nurse line. Informed of below.   Benedict Kue C Elisheba Mcdonnell, RN  

## 2021-08-21 ENCOUNTER — Other Ambulatory Visit: Payer: Self-pay

## 2021-08-21 ENCOUNTER — Inpatient Hospital Stay: Payer: Medicare Other | Attending: Hematology

## 2021-08-21 ENCOUNTER — Other Ambulatory Visit: Payer: Self-pay | Admitting: Family Medicine

## 2021-08-21 ENCOUNTER — Inpatient Hospital Stay (HOSPITAL_BASED_OUTPATIENT_CLINIC_OR_DEPARTMENT_OTHER): Payer: Medicare Other | Admitting: Hematology

## 2021-08-21 VITALS — BP 132/63 | HR 67 | Temp 98.8°F | Resp 18 | Wt 136.9 lb

## 2021-08-21 DIAGNOSIS — Z830 Family history of human immunodeficiency virus [HIV] disease: Secondary | ICD-10-CM | POA: Insufficient documentation

## 2021-08-21 DIAGNOSIS — Z836 Family history of other diseases of the respiratory system: Secondary | ICD-10-CM | POA: Diagnosis not present

## 2021-08-21 DIAGNOSIS — Z8049 Family history of malignant neoplasm of other genital organs: Secondary | ICD-10-CM | POA: Diagnosis not present

## 2021-08-21 DIAGNOSIS — Z823 Family history of stroke: Secondary | ICD-10-CM | POA: Diagnosis not present

## 2021-08-21 DIAGNOSIS — J449 Chronic obstructive pulmonary disease, unspecified: Secondary | ICD-10-CM | POA: Diagnosis not present

## 2021-08-21 DIAGNOSIS — Z801 Family history of malignant neoplasm of trachea, bronchus and lung: Secondary | ICD-10-CM | POA: Insufficient documentation

## 2021-08-21 DIAGNOSIS — C9110 Chronic lymphocytic leukemia of B-cell type not having achieved remission: Secondary | ICD-10-CM | POA: Insufficient documentation

## 2021-08-21 DIAGNOSIS — I10 Essential (primary) hypertension: Secondary | ICD-10-CM

## 2021-08-21 DIAGNOSIS — Z833 Family history of diabetes mellitus: Secondary | ICD-10-CM | POA: Diagnosis not present

## 2021-08-21 DIAGNOSIS — Z8249 Family history of ischemic heart disease and other diseases of the circulatory system: Secondary | ICD-10-CM | POA: Insufficient documentation

## 2021-08-21 DIAGNOSIS — E1122 Type 2 diabetes mellitus with diabetic chronic kidney disease: Secondary | ICD-10-CM | POA: Insufficient documentation

## 2021-08-21 DIAGNOSIS — Z79899 Other long term (current) drug therapy: Secondary | ICD-10-CM | POA: Insufficient documentation

## 2021-08-21 DIAGNOSIS — N1832 Chronic kidney disease, stage 3b: Secondary | ICD-10-CM | POA: Insufficient documentation

## 2021-08-21 DIAGNOSIS — K219 Gastro-esophageal reflux disease without esophagitis: Secondary | ICD-10-CM | POA: Insufficient documentation

## 2021-08-21 DIAGNOSIS — Z8349 Family history of other endocrine, nutritional and metabolic diseases: Secondary | ICD-10-CM | POA: Diagnosis not present

## 2021-08-21 LAB — CBC WITH DIFFERENTIAL (CANCER CENTER ONLY)
Abs Immature Granulocytes: 0.02 10*3/uL (ref 0.00–0.07)
Basophils Absolute: 0 10*3/uL (ref 0.0–0.1)
Basophils Relative: 0 %
Eosinophils Absolute: 0 10*3/uL (ref 0.0–0.5)
Eosinophils Relative: 0 %
HCT: 35.9 % — ABNORMAL LOW (ref 36.0–46.0)
Hemoglobin: 11.4 g/dL — ABNORMAL LOW (ref 12.0–15.0)
Immature Granulocytes: 0 %
Lymphocytes Relative: 40 %
Lymphs Abs: 2.2 10*3/uL (ref 0.7–4.0)
MCH: 28.4 pg (ref 26.0–34.0)
MCHC: 31.8 g/dL (ref 30.0–36.0)
MCV: 89.5 fL (ref 80.0–100.0)
Monocytes Absolute: 0.9 10*3/uL (ref 0.1–1.0)
Monocytes Relative: 16 %
Neutro Abs: 2.4 10*3/uL (ref 1.7–7.7)
Neutrophils Relative %: 44 %
Platelet Count: 180 10*3/uL (ref 150–400)
RBC: 4.01 MIL/uL (ref 3.87–5.11)
RDW: 21.7 % — ABNORMAL HIGH (ref 11.5–15.5)
WBC Count: 5.5 10*3/uL (ref 4.0–10.5)
nRBC: 0 % (ref 0.0–0.2)

## 2021-08-21 LAB — CMP (CANCER CENTER ONLY)
ALT: 26 U/L (ref 0–44)
AST: 23 U/L (ref 15–41)
Albumin: 3.3 g/dL — ABNORMAL LOW (ref 3.5–5.0)
Alkaline Phosphatase: 176 U/L — ABNORMAL HIGH (ref 38–126)
Anion gap: 9 (ref 5–15)
BUN: 13 mg/dL (ref 8–23)
CO2: 28 mmol/L (ref 22–32)
Calcium: 9.3 mg/dL (ref 8.9–10.3)
Chloride: 104 mmol/L (ref 98–111)
Creatinine: 1.54 mg/dL — ABNORMAL HIGH (ref 0.44–1.00)
GFR, Estimated: 35 mL/min — ABNORMAL LOW (ref >60)
Glucose, Bld: 137 mg/dL — ABNORMAL HIGH (ref 70–99)
Potassium: 3.9 mmol/L (ref 3.5–5.1)
Sodium: 141 mmol/L (ref 135–145)
Total Bilirubin: 0.5 mg/dL (ref 0.3–1.2)
Total Protein: 6.3 g/dL — ABNORMAL LOW (ref 6.5–8.1)

## 2021-08-21 LAB — LACTATE DEHYDROGENASE: LDH: 130 U/L (ref 98–192)

## 2021-08-24 ENCOUNTER — Other Ambulatory Visit: Payer: Self-pay

## 2021-08-24 ENCOUNTER — Ambulatory Visit (INDEPENDENT_AMBULATORY_CARE_PROVIDER_SITE_OTHER): Payer: Medicare Other | Admitting: Physician Assistant

## 2021-08-24 ENCOUNTER — Telehealth: Payer: Self-pay | Admitting: Hematology

## 2021-08-24 ENCOUNTER — Ambulatory Visit (HOSPITAL_COMMUNITY)
Admission: RE | Admit: 2021-08-24 | Discharge: 2021-08-24 | Disposition: A | Discharge status: Home / Self Care | Payer: Medicare Other | Source: Ambulatory Visit | Attending: Surgery | Admitting: Surgery

## 2021-08-24 ENCOUNTER — Ambulatory Visit (INDEPENDENT_AMBULATORY_CARE_PROVIDER_SITE_OTHER)
Admission: RE | Admit: 2021-08-24 | Discharge: 2021-08-24 | Disposition: A | Discharge status: Home / Self Care | Payer: Medicare Other | Source: Ambulatory Visit | Attending: Surgery | Admitting: Surgery

## 2021-08-24 VITALS — BP 154/78 | HR 71 | Temp 97.4°F | Resp 20 | Ht 62.0 in | Wt 135.9 lb

## 2021-08-24 DIAGNOSIS — I70211 Atherosclerosis of native arteries of extremities with intermittent claudication, right leg: Secondary | ICD-10-CM

## 2021-08-24 DIAGNOSIS — I10 Essential (primary) hypertension: Secondary | ICD-10-CM

## 2021-08-24 DIAGNOSIS — I739 Peripheral vascular disease, unspecified: Secondary | ICD-10-CM

## 2021-08-24 NOTE — Telephone Encounter (Signed)
Left message with follow-up appointment per 8/26 los. 

## 2021-08-24 NOTE — Progress Notes (Signed)
Office Note     CC:  follow up Requesting Provider:  Danna Hefty, DO  HPI: [name] [name] is a 75 y.o. (1946-01-29) female who presents for surveillance of PAD.  She underwent right femoral endarterectomy with vein patch angioplasty and right femoral to below the knee popliteal artery bypass with Gore-Tex by Dr. Trula Slade on 10/04/2020.  She is also had history of left lower extremity endovascular intervention in [place].  She has edema of right lower extremity since surgery which is managed with compression and elevation.  She states the edema of her right leg does not bother her much unless she is on her feet throughout most of the day without compression.  She denies any claudication, rest pain, or nonhealing wounds of left lower extremity despite known SFA occlusion.  She is taking Plavix and statin daily.  She is an every day smoker.   Past Medical History:  Diagnosis Date   Allergy    environmental   Asthma    Brain tumor ([place])    CKD (chronic kidney disease)    CLL (chronic lymphoblastic leukemia)    CLL (chronic lymphocytic leukemia) (HCC)    Constipation 11/15/2011   COPD (chronic obstructive pulmonary disease) (HCC)    CVA (cerebral vascular accident) ([place])    Diabetes ([place])    Diabetes mellitus without complication (HCC)    GERD (gastroesophageal reflux disease)    Headache    migraines   Heart murmur    Hyperlipidemia    Hypertension    Leukemia ([place])    PVD (peripheral vascular disease) ([place])    Stroke ([place]) 1998   in 1998 due to tumor-right side    Past Surgical History:  Procedure Laterality Date   ABDOMINAL AORTOGRAM W/LOWER EXTREMITY Bilateral 08/12/2020   Procedure: ABDOMINAL AORTOGRAM W/BILATERAL LOWER EXTREMITY RUNOFF;  Surgeon: Serafina Mitchell, MD;  Location: [facility];  Service: Cardiovascular;  Laterality: Bilateral;   ABI  02/2012   ABI <0.65 BL 02/2012   BRAIN MENINGIOMA EXCISION     BRAIN TUMOR EXCISION     Bypass grafting of  RLE for PAD claudication      CARDIAC CATHETERIZATION  04/09/208   CESAREAN SECTION     1974, 78 ,80   CHOLECYSTECTOMY, LAPAROSCOPIC     COLONOSCOPY  2014   [place], [place]   ENDARTERECTOMY FEMORAL Right 10/03/2020   Procedure: RIGHT FEMORAL ENDARTERECTOMY WITH VEIN PATCH ANGIOPLASTY;  Surgeon: Serafina Mitchell, MD;  Location: [facility];  Service: Vascular;  Laterality: Right;   ESOPHAGEAL DILATION     FEMORAL-POPLITEAL BYPASS GRAFT Right 10/03/2020   Procedure: RIGHT FEMORAL- BELOW KNEE POPLITEAL BYPASS USING GORE PROPATEN 6MM GRAFT;  Surgeon: Serafina Mitchell, MD;  Location: [facility];  Service: Vascular;  Laterality: Right;   PATCH ANGIOPLASTY  10/03/2020   Procedure: VEIN PATCH ANGIOPLASTY;  Surgeon: Serafina Mitchell, MD;  Location: [facility];  Service: Vascular;;    Social History   Socioeconomic History   Marital status: Single    Spouse name: Not on file   Number of children: 3   Years of education: Not on file   Highest education level: Not on file  Occupational History   Occupation: DISABLED    Employer: DISABLED  Tobacco Use   Smoking status: Every Day    Packs/day: 0.25    Years: 30.00    Pack years: 7.50    Types: Cigarettes    Start date: 01/01/1976   Smokeless tobacco: Never  Tobacco comments:    ~ 3 cigarettes / day  Vaping Use   Vaping Use: Never used  Substance and Sexual Activity   Alcohol use: Not Currently   Drug use: Not Currently   Sexual activity: Yes  Other Topics Concern   Not on file  Social History Narrative   ** Merged History Encounter **       Health Care POA:  Emergency Contact: Val Riles [phone] (c) End of Life Plan:  Who lives with you: Lives with husband Any pets: none Diet: Patient lacks financial resources for much food. Pt reports eating what is available. Exercise: Patient    does not have an exercise plan. Seatbelts: Patient reports wearing seatbelt when in vehicle.  [name] Exposure/Protection: Hobbies: Bowling, computer games,  Bingo  Has financial difficulties and transportation issues as she and her husband share transpo   rtation      Social Determinants of Radio broadcast assistant Strain: Not on file  Food Insecurity: Not on file  Transportation Needs: Not on file  Physical Activity: Not on file  Stress: Not on file  Social Connections: Not on file  Intimate Partner Violence: Not on file    Family History  Problem Relation Age of Onset   Heart disease Mother    Asthma Mother    Cancer Mother        uterine    Depression Mother    Heart attack Mother 58   Hyperlipidemia Mother    Hypertension Mother    Stroke Mother    Kidney disease Mother    Heart attack Sister    Stroke Sister    Depression Sister    Diabetes Sister    Hyperlipidemia Sister    Depression Sister    Diabetes Sister    Hyperlipidemia Sister    HIV/AIDS Sister    Diabetes Brother    Asthma Brother    Hypertension Brother    Cancer Maternal Aunt        lung   Heart disease Maternal Grandmother    Heart attack Maternal Grandmother    Cancer Brother        colon   HIV/AIDS Brother    Colon cancer Neg Hx     Current Outpatient Medications  Medication Sig Dispense Refill   acetaminophen (TYLENOL) 500 MG tablet Take 500-1,000 mg by mouth every 6 (six) hours as needed for moderate pain.      albuterol (PROVENTIL) (2.5 MG/3ML) 0.083% nebulizer solution Take by nebulization every 6 (six) hours as needed for wheezing or shortness of breath. 90 mL 1   albuterol (VENTOLIN HFA) 108 (90 Base) MCG/ACT inhaler Inhale 2 puffs into the lungs every 6 (six) hours as needed for wheezing or shortness of breath. 8.5 each 11   Alcohol Swabs (ALCOHOL PADS) 70 % PADS SMARTSIG:Pledget(s) Topical Daily     allopurinol (ZYLOPRIM) 300 MG tablet Take 0.5 tablets (150 mg total) by mouth 2 (two) times daily. 30 tablet 1   atorvastatin (LIPITOR) 80 MG tablet Take 1 tablet by mouth every day 30 tablet 6   b complex vitamins capsule Take 1  capsule by mouth daily. 30 capsule 5   Blood Glucose Calibration (OT ULTRA/FASTTK CNTRL SOLN) SOLN See admin instructions.     Blood Glucose Monitoring Suppl (ONETOUCH VERIO) w/Device KIT 1 Device by Does not apply route daily. 1 kit 0   BREO ELLIPTA 100-25 MCG/INH AEPB Inhale 1 puff into the lungs daily.     clopidogrel (  PLAVIX) 75 MG tablet Take 75 mg by mouth daily.     diclofenac Sodium (VOLTAREN) 1 % GEL Apply 2 grams topically four times a day 100 g 11   Dulaglutide (TRULICITY) 3.53 IR/4.4RX SOPN Inject 0.75 mg into the skin once a week.     DULoxetine (CYMBALTA) 30 MG capsule Take 1 capsule (30 mg total) by mouth daily. 90 capsule 1   famotidine (PEPCID) 20 MG tablet Take 20 mg by mouth 2 (two) times daily.     furosemide (LASIX) 20 MG tablet Take 1 tablet (20 mg total) by mouth daily. 30 tablet 3   gabapentin (NEURONTIN) 300 MG capsule Take 600 mg by mouth 2 (two) times daily.     glucose blood (ONETOUCH VERIO) test strip Use to check blood sugar once a day 100 each 12   hydrALAZINE (APRESOLINE) 25 MG tablet Take 1 tablet by mouth 3 times a day 90 tablet 0   iron polysaccharides (NIFEREX) 150 MG capsule Take 1 capsule (150 mg total) by mouth daily. 30 capsule 5   JARDIANCE 25 MG TABS tablet Take 25 mg by mouth daily.     Lancet Devices (EASY MINI EJECT LANCING DEVICE) MISC daily.     Lancets (ONETOUCH ULTRASOFT) lancets Use to check blood sugar once a day 100 each 12   lidocaine (XYLOCAINE) 5 % ointment Apply 1 application topically as needed. Apply to R>L feet when burning up to 4x/day as needed 50 g 3   metFORMIN (GLUCOPHAGE) 500 MG tablet Take 1 tablet (500 mg total) by mouth daily. 90 tablet 0   montelukast (SINGULAIR) 10 MG tablet Take 1 tablet (10 mg total) by mouth at bedtime. 30 tablet 11   nitroGLYCERIN (NITROSTAT) 0.4 MG SL tablet Dissolve 1 tablet under tongue every 5 minutes, up to 3 doses for chest pain 25 tablet 11   traMADol (ULTRAM) 50 MG tablet Take 1 tablet (50 mg  total) by mouth 2 (two) times daily as needed for moderate pain. Will prescribe 1 months supply.  Please see me in clinic in the next month for further refills. 30 tablet 0   venetoclax (VENCLEXTA) 100 MG tablet Take 2 tablets (200 mg total) by mouth daily. Tablets should be swallowed whole with a meal and a full glass of water. 60 tablet 2   No current facility-administered medications for this visit.    Allergies  Allergen Reactions   Gazyva [Obinutuzumab] Shortness Of Breath    Acute respiratory distress   Aspirin     Abdominal pain.    No [facility] Food Allergies Diarrhea and Nausea And Vomiting    Cabbage, stomach pain    Aspirin Other (See Comments)    irritates stomach   Baclofen Other (See Comments)    Stomach irritation     REVIEW OF SYSTEMS:   '[X]'  denotes positive finding, '[ ]'  denotes negative finding Cardiac  Comments:  Chest pain or chest pressure:    Shortness of breath upon exertion:    Short of breath when lying flat:    Irregular heart rhythm:        Vascular    Pain in calf, thigh, or hip brought on by ambulation:    Pain in feet at night that wakes you up from your sleep:     Blood clot in your veins:    Leg swelling:         Pulmonary    Oxygen at home:    Productive cough:  Wheezing:         Neurologic    Sudden weakness in arms or legs:     Sudden numbness in arms or legs:     Sudden onset of difficulty speaking or slurred speech:    Temporary loss of vision in one eye:     Problems with dizziness:         Gastrointestinal    Blood in stool:     Vomited blood:         Genitourinary    Burning when urinating:     Blood in urine:        Psychiatric    Major depression:         Hematologic    Bleeding problems:    Problems with blood clotting too easily:        Skin    Rashes or ulcers:        Constitutional    Fever or chills:      PHYSICAL EXAMINATION:  Vitals:   08/24/21 1100  BP: (!) 154/78  Pulse: 71  Resp: 20   Temp: (!) 97.4 F (36.3 C)  TempSrc: Temporal  SpO2: 97%  Weight: 135 lb 14.4 oz (61.6 kg)  Height: '5\' 2"'  (1.575 m)    General:  WDWN in NAD; vital signs documented above Gait: Not observed HENT: WNL, normocephalic Pulmonary: normal non-labored breathing , without Rales, rhonchi,  wheezing Cardiac: regular HR Abdomen: soft, NT, no masses Skin: without rashes Vascular Exam/Pulses:  Right Left  Radial 2+ (normal) 2+ (normal)  DP 2+ (normal) absent  PT absent absent   Extremities: Edema of right lower extremity to the level of the knee without venous ulcerations or cellulitis; no nonhealing wounds of left lower extremity Musculoskeletal: no muscle wasting or atrophy  Neurologic: A&O X 3;  No focal weakness or paresthesias are detected Psychiatric:  The pt has Normal affect.   Non-Invasive Vascular Imaging:   Patent right lower extremity bypass without any hemodynamically significant stenosis  ABI/TBIToday's ABIToday's TBIPrevious ABIPrevious TBI  +-------+-----------+-----------+------------+------------+  Right  0.98       0.60       0.99        0.64          +-------+-----------+-----------+------------+------------+  Left   0.65       0.38       0.65        0.63          ASSESSMENT/PLAN:: 75 y.o. female here for surveillance of PAD and R leg bypass  -Right lower extremity is well-perfused with 2+ palpable DP pulse; bypass duplex demonstrates a widely patent bypass graft without hemodynamically significant stenosis -Patient has a known left SFA occlusion however this is asymptomatic.  We will hold off on treatment unless she develops rest pain or nonhealing wounds -Encouraged smoking cessation -Continue Plavix daily -Recheck bypass duplex with ABIs in 1 year   Dagoberto Ligas, PA-C [facility] [phone]  Clinic MD:   Trula Slade

## 2021-08-25 ENCOUNTER — Other Ambulatory Visit: Payer: Self-pay | Admitting: Physical Medicine and Rehabilitation

## 2021-08-26 ENCOUNTER — Other Ambulatory Visit (HOSPITAL_COMMUNITY): Payer: Self-pay

## 2021-08-27 ENCOUNTER — Other Ambulatory Visit: Payer: Self-pay | Admitting: Physical Medicine and Rehabilitation

## 2021-08-27 ENCOUNTER — Encounter: Payer: Self-pay | Admitting: Hematology

## 2021-08-27 NOTE — Progress Notes (Signed)
HEMATOLOGY/ONCOLOGY CLINIC NOTE  Date of Service: 08/27/21    Patient Care Team: Alcus Dad, MD as PCP - General (Family Medicine) Sueanne Margarita, MD as PCP - Cardiology (Cardiology) Danna Hefty, DO (Family Medicine)  CHIEF COMPLAINTS/PURPOSE OF CONSULTATION:  F/u for CLL  HISTORY OF PRESENTING ILLNESS:  [name] [name] is a wonderful 75 y.o. female smoker with history of CLL who has been referred to [place] by Dr Alcus Dad, MD at [facility] for evaluation and management of elevated WBC/lymphocytes.   She is accompanied by her husband. She reported to ED on 11/27/2017 with cough and abdominal pain with COPD exacerbation. Her labs on this day showed WBC elevated at 62.5. She has not previously seen a hematologist. She reports a plan to quit smoking on the first day of the new year. She reports worsening decrease in balance and states she has a cane and walker that she uses as needed. She was taking Prednisone.  No weight loss/fevers/chills/night sweats/ palpable lumps or bumps.  On review of systems, pt reports back pain, change in balance, abdominal pain, weight loss and denies changes in BM, fever and any other accompanying symptoms.   INTERVAL HISTORY:  [name] [name] is here for management and evaluation of her Chronic Leukocytic Leukemia. The patient's last visit with [place] was on 07/14/2021 . We are joined today by her husband.  The pt reports she was recently hospitalized in end of July for pneumonia and sepsis and has been of the venetoclax for few weeks.  Has completely recovered from the pneumonia.  Still notes significant fatigue.   She notes some of her other medications are making her sleepy.  We discussed at length the importance of her reporting this to her primary care physician so that her medications can be reviewed and adjusted.  Patient has not had any issues tolerating her venetoclax at this time.  No diarrhea no nausea no vomiting.  Lab  results today reviewed with the patient  On review of system patient notes no fevers no chills no night sweats no sudden unexpected weight loss no new lumps or bumps.    MEDICAL HISTORY:  Past Medical History:  Diagnosis Date   Allergy    environmental   Asthma    Brain tumor ([place])    CKD (chronic kidney disease)    CLL (chronic lymphoblastic leukemia)    CLL (chronic lymphocytic leukemia) (HCC)    Constipation 11/15/2011   COPD (chronic obstructive pulmonary disease) (HCC)    CVA (cerebral vascular accident) ([place])    Diabetes ([place])    Diabetes mellitus without complication (HCC)    GERD (gastroesophageal reflux disease)    Headache    migraines   Heart murmur    Hyperlipidemia    Hypertension    Leukemia ([employer])    PVD (peripheral vascular disease) ([place])    Stroke ([place]) 1998   in 1998 due to tumor-right side    SURGICAL HISTORY: Past Surgical History:  Procedure Laterality Date   ABDOMINAL AORTOGRAM W/LOWER EXTREMITY Bilateral 08/12/2020   Procedure: ABDOMINAL AORTOGRAM W/BILATERAL LOWER EXTREMITY RUNOFF;  Surgeon: Serafina Mitchell, MD;  Location: [facility];  Service: Cardiovascular;  Laterality: Bilateral;   ABI  02/2012   ABI <0.65 BL 02/2012   BRAIN MENINGIOMA EXCISION     BRAIN TUMOR EXCISION     Bypass grafting of RLE for PAD claudication      CARDIAC CATHETERIZATION  04/09/208   CESAREAN  SECTION     1974, 78 ,80   CHOLECYSTECTOMY, LAPAROSCOPIC     COLONOSCOPY  2014   [place], [place]   ENDARTERECTOMY FEMORAL Right 10/03/2020   Procedure: RIGHT FEMORAL ENDARTERECTOMY WITH VEIN PATCH ANGIOPLASTY;  Surgeon: Serafina Mitchell, MD;  Location: [facility];  Service: Vascular;  Laterality: Right;   ESOPHAGEAL DILATION     FEMORAL-POPLITEAL BYPASS GRAFT Right 10/03/2020   Procedure: RIGHT FEMORAL- BELOW KNEE POPLITEAL BYPASS USING GORE PROPATEN 6MM GRAFT;  Surgeon: Serafina Mitchell, MD;  Location: [facility];  Service: Vascular;  Laterality: Right;   PATCH ANGIOPLASTY   10/03/2020   Procedure: VEIN PATCH ANGIOPLASTY;  Surgeon: Serafina Mitchell, MD;  Location: [facility];  Service: Vascular;;    SOCIAL HISTORY: Social History   Socioeconomic History   Marital status: Single    Spouse name: Not on file   Number of children: 3   Years of education: Not on file   Highest education level: Not on file  Occupational History   Occupation: DISABLED    Employer: DISABLED  Tobacco Use   Smoking status: Every Day    Packs/day: 0.25    Years: 30.00    Pack years: 7.50    Types: Cigarettes    Start date: 01/01/1976   Smokeless tobacco: Never   Tobacco comments:    ~ 3 cigarettes / day  Vaping Use   Vaping Use: Never used  Substance and Sexual Activity   Alcohol use: Not Currently   Drug use: Not Currently   Sexual activity: Yes  Other Topics Concern   Not on file  Social History Narrative   ** Merged History Encounter **       Health Care POA:  Emergency Contact: Val Riles [phone] (c) End of Life Plan:  Who lives with you: Lives with husband Any pets: none Diet: Patient lacks financial resources for much food. Pt reports eating what is available. Exercise: Patient    does not have an exercise plan. Seatbelts: Patient reports wearing seatbelt when in vehicle.  [name] Exposure/Protection: Hobbies: Bowling, computer games, Bingo  Has financial difficulties and transportation issues as she and her husband share transpo   rtation      Social Determinants of Health   Financial Resource Strain: Not on file  Food Insecurity: Not on file  Transportation Needs: Not on file  Physical Activity: Not on file  Stress: Not on file  Social Connections: Not on file  Intimate Partner Violence: Not on file    FAMILY HISTORY: Family History  Problem Relation Age of Onset   Heart disease Mother    Asthma Mother    Cancer Mother        uterine    Depression Mother    Heart attack Mother 40   Hyperlipidemia Mother    Hypertension Mother    Stroke  Mother    Kidney disease Mother    Heart attack Sister    Stroke Sister    Depression Sister    Diabetes Sister    Hyperlipidemia Sister    Depression Sister    Diabetes Sister    Hyperlipidemia Sister    HIV/AIDS Sister    Diabetes Brother    Asthma Brother    Hypertension Brother    Cancer Maternal Aunt        lung   Heart disease Maternal Grandmother    Heart attack Maternal Grandmother    Cancer Brother        colon  HIV/AIDS Brother    Colon cancer Neg Hx     ALLERGIES:  is allergic to gazyva [obinutuzumab], aspirin, no healthtouch food allergies, aspirin, and baclofen.  MEDICATIONS:  Current Outpatient Medications  Medication Sig Dispense Refill   acetaminophen (TYLENOL) 500 MG tablet Take 500-1,000 mg by mouth every 6 (six) hours as needed for moderate pain.      albuterol (PROVENTIL) (2.5 MG/3ML) 0.083% nebulizer solution Take by nebulization every 6 (six) hours as needed for wheezing or shortness of breath. 90 mL 1   albuterol (VENTOLIN HFA) 108 (90 Base) MCG/ACT inhaler Inhale 2 puffs into the lungs every 6 (six) hours as needed for wheezing or shortness of breath. 8.5 each 11   Alcohol Swabs (ALCOHOL PADS) 70 % PADS SMARTSIG:Pledget(s) Topical Daily     allopurinol (ZYLOPRIM) 300 MG tablet Take 0.5 tablets (150 mg total) by mouth 2 (two) times daily. 30 tablet 1   atorvastatin (LIPITOR) 80 MG tablet Take 1 tablet by mouth every day 30 tablet 6   b complex vitamins capsule Take 1 capsule by mouth daily. 30 capsule 5   Blood Glucose Calibration (OT ULTRA/FASTTK CNTRL SOLN) SOLN See admin instructions.     Blood Glucose Monitoring Suppl (ONETOUCH VERIO) w/Device KIT 1 Device by Does not apply route daily. 1 kit 0   BREO ELLIPTA 100-25 MCG/INH AEPB Inhale 1 puff into the lungs daily.     clopidogrel (PLAVIX) 75 MG tablet Take 75 mg by mouth daily.     diclofenac Sodium (VOLTAREN) 1 % GEL Apply 2 grams topically four times a day 100 g 11   Dulaglutide (TRULICITY) 2.87  GO/1.1XB SOPN Inject 0.75 mg into the skin once a week.     DULoxetine (CYMBALTA) 30 MG capsule Take 1 capsule (30 mg total) by mouth daily. 90 capsule 1   famotidine (PEPCID) 20 MG tablet Take 20 mg by mouth 2 (two) times daily.     furosemide (LASIX) 20 MG tablet Take 1 tablet (20 mg total) by mouth daily. 30 tablet 3   gabapentin (NEURONTIN) 300 MG capsule take 2 capsules by mouth twice daily 120 capsule 11   glucose blood (ONETOUCH VERIO) test strip Use to check blood sugar once a day 100 each 12   hydrALAZINE (APRESOLINE) 25 MG tablet Take 1 tablet by mouth 3 times a day 90 tablet 0   iron polysaccharides (NIFEREX) 150 MG capsule Take 1 capsule (150 mg total) by mouth daily. 30 capsule 5   JARDIANCE 25 MG TABS tablet Take 25 mg by mouth daily.     Lancet Devices (EASY MINI EJECT LANCING DEVICE) MISC daily.     Lancets (ONETOUCH ULTRASOFT) lancets Use to check blood sugar once a day 100 each 12   lidocaine (XYLOCAINE) 5 % ointment Apply 1 application topically as needed. Apply to R>L feet when burning up to 4x/day as needed 50 g 3   metFORMIN (GLUCOPHAGE) 500 MG tablet Take 1 tablet (500 mg total) by mouth daily. 90 tablet 0   montelukast (SINGULAIR) 10 MG tablet Take 1 tablet (10 mg total) by mouth at bedtime. 30 tablet 11   nitroGLYCERIN (NITROSTAT) 0.4 MG SL tablet Dissolve 1 tablet under tongue every 5 minutes, up to 3 doses for chest pain 25 tablet 11   traMADol (ULTRAM) 50 MG tablet Take 1 tablet (50 mg total) by mouth 2 (two) times daily as needed for moderate pain. Will prescribe 1 months supply.  Please see me in clinic in the next  month for further refills. 30 tablet 0   venetoclax (VENCLEXTA) 100 MG tablet Take 2 tablets (200 mg total) by mouth daily. Tablets should be swallowed whole with a meal and a full glass of water. 60 tablet 2   No current facility-administered medications for this visit.    REVIEW OF SYSTEMS:   .10 Point review of Systems was done is negative except as  noted above.  PHYSICAL EXAMINATION: ECOG FS:2 - Symptomatic, <50% confined to bed  Vitals:   08/21/21 1328  BP: 132/63  Pulse: 67  Resp: 18  Temp: 98.8 F (37.1 C)  SpO2: 98%    Wt Readings from Last 3 Encounters:  08/24/21 135 lb 14.4 oz (61.6 kg)  08/21/21 136 lb 14.4 oz (62.1 kg)  08/10/21 132 lb 3.2 oz (60 kg)   Body mass index is 25.04 kg/m.   Marland Kitchen GENERAL:alert, in no acute distress and comfortable SKIN: no acute rashes, no significant lesions EYES: conjunctiva are pink and non-injected, sclera anicteric OROPHARYNX: MMM, no exudates, no oropharyngeal erythema or ulceration NECK: supple, no JVD LYMPH:  no palpable lymphadenopathy in the cervical, axillary or inguinal regions LUNGS: clear to auscultation b/l with normal respiratory effort HEART: regular rate & rhythm ABDOMEN:  normoactive bowel sounds , non tender, not distended. Extremity: no pedal edema PSYCH: alert & oriented x 3 with fluent speech NEURO: no focal motor/sensory deficits   LABORATORY DATA:  I have reviewed the data as listed   CBC Latest Ref Rng & Units 08/21/2021 07/27/2021 07/26/2021  WBC 4.0 - 10.5 K/uL 5.5 26.4(H) 29.4(H)  Hemoglobin 12.0 - 15.0 g/dL 11.4(L) 12.7 12.2  Hematocrit 36.0 - 46.0 % 35.9(L) 39.1 39.4  Platelets 150 - 400 K/uL 180 189 204    CMP Latest Ref Rng & Units 08/21/2021 07/27/2021 07/26/2021  Glucose 70 - 99 mg/dL 137(H) 191(H) 120(H)  BUN 8 - 23 mg/dL '13 12 11  ' Creatinine 0.44 - 1.00 mg/dL 1.54(H) 1.03(H) 1.18(H)  Sodium 135 - 145 mmol/L 141 140 141  Potassium 3.5 - 5.1 mmol/L 3.9 3.8 3.1(L)  Chloride 98 - 111 mmol/L 104 102 103  CO2 22 - 32 mmol/L '28 26 22  ' Calcium 8.9 - 10.3 mg/dL 9.3 9.1 8.9  Total Protein 6.5 - 8.1 g/dL 6.3(L) - -  Total Bilirubin 0.3 - 1.2 mg/dL 0.5 - -  Alkaline Phos 38 - 126 U/L 176(H) - -  AST 15 - 41 U/L 23 - -  ALT 0 - 44 U/L 26 - -   . Lab Results  Component Value Date   LDH 130 08/21/2021      Component     Latest Ref Rng & Units  02/06/2021  Vitamin B12     180 - 914 pg/mL 448  Haptoglobin     42 - 346 mg/dL 223   Lab Results  Component Value Date   LDH 130 08/21/2021        . Lab Results  Component Value Date   LDH 130 08/21/2021    RADIOGRAPHIC STUDIES: I have personally reviewed the radiological images as listed and agreed with the findings in the report. DG Chest 2 View  Result Date: 08/17/2021 CLINICAL DATA:  Pneumonia EXAM: CHEST - 2 VIEW COMPARISON:  07/27/2021 FINDINGS: Mild cardiomegaly. Significant interval improvement in heterogeneous airspace opacity of the peripheral right upper lobe. No new airspace opacity. Osseous structures are unremarkable. IMPRESSION: Significant interval improvement in heterogeneous airspace opacity of the peripheral right upper lobe, consistent with resolving infection.  No new airspace opacity. Recommend additional follow-up radiographs at 4-6 weeks to document complete resolution and exclude underlying mass. Electronically Signed   By: Eddie Candle M.D.   On: 08/17/2021 12:53   VAS [place] ABI WITH/WO TBI  Result Date: 08/24/2021  LOWER EXTREMITY DOPPLER STUDY Patient Name:  [name] [name]  Date of Exam:   08/24/2021 Medical Rec #: [id number]        Accession #:    [id number] Date of Birth: 1946/11/10        Patient Gender: F Patient Age:   66 years Exam Location:  [facility] Procedure:      VAS [place] ABI WITH/WO TBI Referring Phys: Harold Barban --------------------------------------------------------------------------------  Indications: Peripheral artery disease. High Risk Factors: Hypertension, hyperlipidemia, Diabetes, current smoker, prior                    CVA.  Vascular Interventions: 10/03/2020: Right femoral to BK popliteal bypass graft.                         Right femoral endarterectomy extending into the                         profundafemoral artery with vein angioplasty.                          2009: Right SFA stent.                          Left  femoral-popliteal bypass graft done in [place]. Comparison Study: 02/23/2021: Rt ABI 0.99; Lt ABI 0.65 Performing Technologist: Ivan Croft  Examination Guidelines: A complete evaluation includes at minimum, Doppler waveform signals and systolic blood pressure reading at the level of bilateral brachial, anterior tibial, and posterior tibial arteries, when vessel segments are accessible. Bilateral testing is considered an integral part of a complete examination. Photoelectric Plethysmograph (PPG) waveforms and toe systolic pressure readings are included as required and additional duplex testing as needed. Limited examinations for reoccurring indications may be performed as noted.  ABI Findings: +---------+------------------+-----+----------+--------+ Right    Rt Pressure (mmHg)IndexWaveform  Comment  +---------+------------------+-----+----------+--------+ Brachial 156                                       +---------+------------------+-----+----------+--------+ PTA      153               0.98 biphasic           +---------+------------------+-----+----------+--------+ DP       144               0.92 monophasic         +---------+------------------+-----+----------+--------+ Great Toe94                0.60                    +---------+------------------+-----+----------+--------+ +---------+------------------+-----+----------+-------+ Left     Lt Pressure (mmHg)IndexWaveform  Comment +---------+------------------+-----+----------+-------+ Brachial 156                                      +---------+------------------+-----+----------+-------+  PTA      98                0.63 monophasic        +---------+------------------+-----+----------+-------+ DP       101               0.65 monophasic        +---------+------------------+-----+----------+-------+ Great Toe60                0.38                    +---------+------------------+-----+----------+-------+ +-------+-----------+-----------+------------+------------+ ABI/TBIToday's ABIToday's TBIPrevious ABIPrevious TBI +-------+-----------+-----------+------------+------------+ Right  0.98       0.60       0.99        0.64         +-------+-----------+-----------+------------+------------+ Left   0.65       0.38       0.65        0.63         +-------+-----------+-----------+------------+------------+  Bilateral ABIs appear essentially unchanged compared to prior study on 02/23/2021.  Summary: Right: Resting right ankle-brachial index is within normal range. No evidence of significant right lower extremity arterial disease. The right toe-brachial index is abnormal. Left: Resting left ankle-brachial index indicates moderate left lower extremity arterial disease. The left toe-brachial index is abnormal.  *See table(s) above for measurements and observations.  Electronically signed by Harold Barban MD on 08/24/2021 at 63:47:12 PM.    Final    VAS [place] LOWER EXTREMITY BYPASS GRAFT DUPL  Result Date: 08/24/2021 LOWER EXTREMITY ARTERIAL DUPLEX STUDY Patient Name:  [name] [name]  Date of Exam:   08/24/2021 Medical Rec #: [id number]        Accession #:    [id number] Date of Birth: 1946-11-25        Patient Gender: F Patient Age:   83 years Exam Location:  [facility] Procedure:      VAS [place] LOWER EXTREMITY BYPASS GRAFT DUPLEX Referring Phys: Harold Barban --------------------------------------------------------------------------------  Indications: Peripheral artery disease. High Risk Factors: Hypertension, hyperlipidemia, Diabetes, current smoker, prior                    CVA.  Vascular Interventions: 10/03/2020: Right femoral to BK popliteal bypass graft.                         Right femoral endarterectomy extending into the profunda                         femoral artery with vein angioplasty.                          2009: Right SFA  stent.                          Left femoral-popliteal bypass graft done in [place]. Current ABI:            Rt ABI 0.98, Lt ABI 0.65 Comparison Study: 02/23/2021: Patent right femoral to popliteal bypass graft                   with no visualized stenosis. The mid to distal thigh  graft was                   not well visualized due to depth. Performing Technologist: Ivan Croft  Examination Guidelines: A complete evaluation includes B-mode imaging, spectral Doppler, color Doppler, and power Doppler as needed of all accessible portions of each vessel. Bilateral testing is considered an integral part of a complete examination. Limited examinations for reoccurring indications may be performed as noted.  Right Graft #1: Femoral-popliteal +------------------+--------+--------+----------+--------+                   PSV cm/sStenosisWaveform  Comments +------------------+--------+--------+----------+--------+ Inflow            121             biphasic           +------------------+--------+--------+----------+--------+ Prox Anastomosis  154             monophasic         +------------------+--------+--------+----------+--------+ Proximal Graft    138             monophasic         +------------------+--------+--------+----------+--------+ Mid Graft         88              monophasic         +------------------+--------+--------+----------+--------+ Distal Graft      94              monophasic         +------------------+--------+--------+----------+--------+ Distal Anastomosis109             monophasic         +------------------+--------+--------+----------+--------+ Outflow           149             monophasic         +------------------+--------+--------+----------+--------+   Summary: Right: Right femoral- BK popliteal bypass graft appears patent with no evidence of stenosis. Limited visualization of the graft in the mid to distal thigh due  to depth.  See table(s) above for measurements and observations. Electronically signed by Harold Barban MD on 08/24/2021 at 33:48:32 PM.    Final     Surgical Pathology 12/13/2017   ASSESSMENT & PLAN:   [name] [name] is a wonderful 75 y.o. female with    1. Rai 1 Chronic Lymphocytic Leukemia  - Trisomy 12 mutation. No associated thrombocytopenia.  Mild anemia - likely from acute blood loss from significant epistaxis. No constitutional symptoms. Minimal LNaednopathy No splenomegaly  #2 .Lung nodules on CT in 12/2017. Rpt CT chest 08/09/2018 - stable  #3 Patient Active Problem List   Diagnosis Date Noted   Pleuritic chest pain 08/03/2021   Sepsis ([place]) 07/23/2021   Community acquired pneumonia of right lung    Hypoxia    Fall 05/04/2021   Finger pain, right 05/04/2021   Counseling regarding advance care planning and goals of care 02/19/2021   Iron deficiency anemia 02/12/2021   Peripheral arterial disease ([place]) 10/03/2020   (HFpEF) heart failure with preserved ejection fraction ([place]) 06/11/2020   Venous insufficiency (chronic) (peripheral) 06/22/2019   Pulmonary nodules 01/04/2018   Mass of left lower leg 01/04/2018   Chronic kidney disease (CKD), stage III (moderate) ([place]) 06/04/2016   Chronic back pain 10/02/2015   DM (diabetes mellitus), type 2 with peripheral vascular complications ([place]) 75/91/6384   Chronic lymphocytic leukemia ([place]) 04/11/2009   PERIPHERAL VASCULAR DISEASE 10/13/2007   HYPERCHOLESTEROLEMIA 02/23/2007   ANXIETY 02/23/2007  TOBACCO DEPENDENCE 02/23/2007   Essential hypertension 02/23/2007   COPD (chronic obstructive pulmonary disease) ([place]) 02/23/2007   GASTROESOPHAGEAL REFLUX, NO ESOPHAGITIS 02/23/2007    PLAN: -Discussed pt labwork today, CBC and CMP stable. -No overt lab evidence of CLL progression at this time. -Patient was off venetoclax for a few weeks due to pneumonia requiring hospitalization.  She has recovered from her pneumonia and is  back on her venetoclax. -No obvious significant toxicities from her venetoclax at this time. --Continue with 200 mg Venetoclax at this time. Will not escalate this due to improvement in labs and toleration. -Recommended pt have PCP do a work-up for her back pain and potential causes for imbalance and falls.-Continue to wear compression socks and elevate legs to alleviate leg swelling.  -Patient is also somewhat confused about her regular medications and feels that some of the medications are making her very sleepy and affecting the way she is functioning.  She was strongly recommended to correct with her primary care physician regarding management of her other medications and to discuss some of these concerns. -Recommended pt drink 48-64 oz water daily. -Continue PO Iron and B-complex daily.  -Referral to outpatient [facility]. -Will see back in 2 months with labs.  -Counseled on getting the new COVID-19 booster vaccine once that is come out. -Has had Evusheld on 05/04/2021.  FOLLOW UP: RTC with Dr Irene Limbo with labs in 2 months   The total time spent in the appointment was 30 minutes and more than 50% was on counseling and direct patient cares.  All of the patient's questions were answered with apparent satisfaction. The patient knows to call the clinic with any problems, questions or concerns.    Sullivan Lone MD [place] AAHIVMS [facility] Hematology/Oncology Physician [facility]  (Office):       [phone] (Work cell):  [phone] (Fax):           [phone]

## 2021-08-28 ENCOUNTER — Ambulatory Visit (INDEPENDENT_AMBULATORY_CARE_PROVIDER_SITE_OTHER): Payer: Medicare Other | Admitting: Family Medicine

## 2021-08-28 ENCOUNTER — Other Ambulatory Visit (HOSPITAL_COMMUNITY): Payer: Self-pay

## 2021-08-28 ENCOUNTER — Encounter: Payer: Self-pay | Admitting: Family Medicine

## 2021-08-28 ENCOUNTER — Other Ambulatory Visit: Payer: Self-pay

## 2021-08-28 VITALS — BP 148/70 | HR 79 | Wt 139.0 lb

## 2021-08-28 DIAGNOSIS — I10 Essential (primary) hypertension: Secondary | ICD-10-CM

## 2021-08-28 DIAGNOSIS — R5383 Other fatigue: Secondary | ICD-10-CM | POA: Diagnosis not present

## 2021-08-28 NOTE — Progress Notes (Signed)
    SUBJECTIVE:   CHIEF COMPLAINT / HPI:   HTN f/u Patient here to follow up on her blood pressure. Amlodipine was stopped last month due to low BP, and her hydralazine was decreased from TID to BID. States she has been checking her BP once per day at home. However, she forgot her written log and does not have a sense of the numbers. No systolics below [id number] or above 150 per her report.  Med Rec Unfortunately patient did not bring her medications to today's appointment and she is not very familiar with the names/dosages.  Fatigue Per chart review, patient reported fatigue/sleepiness to her oncologist. Both the patient and Dr. Irene Limbo wondered if this could be related to some of her medications. Today patient states she doesn't think her fatigue/sleepiness is bothersome or problematic.  She and her husband report "the great-grands wear them out". She naps daily for several hours and so does her husband. They are both somewhat poor historians but it appears they will sleep from ~12-5pm and then again from 11:30pm-4am  PERTINENT  PMH / PSH: CLL, CKD stage 3, T2DM, COPD, tobacco use, HFpEF  OBJECTIVE:   BP (!) 148/70   Pulse 79   Wt 139 lb (63 kg)   SpO2 98%   BMI 25.42 kg/m   General: NAD, pleasant, able to participate in exam Cardiac: RRR, S1 S2 present. normal heart sounds Respiratory: CTAB, normal effort, No wheezes, rales or rhonchi Abdomen: Soft, non-tender, non-distended Skin: warm and dry, no rashes noted Neuro: alert, no obvious focal deficits Psych: Normal affect and mood  ASSESSMENT/PLAN:   Essential hypertension BP slightly above goal today (148/70). Goal <140/90. Her amlodipine was discontinued and Hydralazine decreased to BID ~1 month ago due to hypotension. Will not make any changes today, particularly because patient is unfamiliar with her current meds and did not bring her home BP log. Will arrange appointment with pharmacy for medication review.   Fatigue Patient  with daytime sleepiness, although she is not concerned about this. Per chart review, her oncologist wonders whether her medications are contributing. I agree that medications could be playing a role, particularly because patient is not familiar with names/dosages. Fortunately she does have a pre-filled box of meds through [facility]. Unfortunately, she did not bring her meds today so we could not complete adequate med rec. Will arrange appointment with pharmacy for medication review. Also discussed appropriate sleep hygiene as she seems to sleep for a few hours at night and a few during the day.   Alcus Dad, MD [place]

## 2021-08-28 NOTE — Patient Instructions (Signed)
It was great to see you!  Things we discussed at today's visit: - Please bring ALL your medications to every visit - We will not make any medication changes today - If you become concerned that you're sleeping too much, let us know and we can schedule another appointment  Otherwise take care and seek immediate care sooner if you develop any concerns.  Dr[facility]

## 2021-08-29 MED ORDER — HYDRALAZINE HCL 25 MG PO TABS: 25.0000 mg | ORAL_TABLET | Freq: Two times a day (BID) | ORAL | 1 refills | Status: DC

## 2021-08-29 MED ORDER — TRAMADOL HCL 50 MG PO TABS: 50.0000 mg | ORAL_TABLET | Freq: Two times a day (BID) | ORAL | 0 refills | Status: DC | PRN

## 2021-08-29 NOTE — Assessment & Plan Note (Signed)
BP slightly above goal today (148/70). Goal <140/90. Her amlodipine was discontinued and Hydralazine decreased to BID ~1 month ago due to hypotension. Will not make any changes today, particularly because patient is unfamiliar with her current meds and did not bring her home BP log. Will arrange appointment with pharmacy for medication review.

## 2021-09-06 ENCOUNTER — Other Ambulatory Visit: Payer: Self-pay | Admitting: Family Medicine

## 2021-09-06 DIAGNOSIS — I10 Essential (primary) hypertension: Secondary | ICD-10-CM

## 2021-09-08 ENCOUNTER — Other Ambulatory Visit: Payer: Self-pay | Admitting: Family Medicine

## 2021-09-11 ENCOUNTER — Other Ambulatory Visit: Payer: Self-pay

## 2021-09-11 DIAGNOSIS — C9110 Chronic lymphocytic leukemia of B-cell type not having achieved remission: Secondary | ICD-10-CM

## 2021-09-14 ENCOUNTER — Inpatient Hospital Stay: Payer: Medicare Other | Attending: Hematology

## 2021-09-14 ENCOUNTER — Inpatient Hospital Stay (HOSPITAL_BASED_OUTPATIENT_CLINIC_OR_DEPARTMENT_OTHER): Payer: Medicare Other | Admitting: Hematology

## 2021-09-14 ENCOUNTER — Other Ambulatory Visit: Payer: Self-pay

## 2021-09-14 ENCOUNTER — Ambulatory Visit: Payer: Medicare Other | Admitting: Hematology

## 2021-09-14 ENCOUNTER — Other Ambulatory Visit: Payer: Medicare Other

## 2021-09-14 VITALS — BP 164/77 | HR 69 | Temp 98.0°F | Resp 17 | Wt 144.8 lb

## 2021-09-14 DIAGNOSIS — C9110 Chronic lymphocytic leukemia of B-cell type not having achieved remission: Secondary | ICD-10-CM

## 2021-09-14 DIAGNOSIS — F17210 Nicotine dependence, cigarettes, uncomplicated: Secondary | ICD-10-CM | POA: Insufficient documentation

## 2021-09-14 DIAGNOSIS — R918 Other nonspecific abnormal finding of lung field: Secondary | ICD-10-CM | POA: Diagnosis not present

## 2021-09-14 LAB — CMP (CANCER CENTER ONLY)
ALT: 31 U/L (ref 0–44)
AST: 21 U/L (ref 15–41)
Albumin: 3.4 g/dL — ABNORMAL LOW (ref 3.5–5.0)
Alkaline Phosphatase: 156 U/L — ABNORMAL HIGH (ref 38–126)
Anion gap: 8 (ref 5–15)
BUN: 13 mg/dL (ref 8–23)
CO2: 30 mmol/L (ref 22–32)
Calcium: 8.7 mg/dL — ABNORMAL LOW (ref 8.9–10.3)
Chloride: 104 mmol/L (ref 98–111)
Creatinine: 1.38 mg/dL — ABNORMAL HIGH (ref 0.44–1.00)
GFR, Estimated: 40 mL/min — ABNORMAL LOW (ref >60)
Glucose, Bld: 133 mg/dL — ABNORMAL HIGH (ref 70–99)
Potassium: 3.7 mmol/L (ref 3.5–5.1)
Sodium: 142 mmol/L (ref 135–145)
Total Bilirubin: 0.4 mg/dL (ref 0.3–1.2)
Total Protein: 6.1 g/dL — ABNORMAL LOW (ref 6.5–8.1)

## 2021-09-14 LAB — CBC WITH DIFFERENTIAL (CANCER CENTER ONLY)
Abs Immature Granulocytes: 0.02 10*3/uL (ref 0.00–0.07)
Basophils Absolute: 0 10*3/uL (ref 0.0–0.1)
Basophils Relative: 0 %
Eosinophils Absolute: 0 10*3/uL (ref 0.0–0.5)
Eosinophils Relative: 1 %
HCT: 35.8 % — ABNORMAL LOW (ref 36.0–46.0)
Hemoglobin: 11.1 g/dL — ABNORMAL LOW (ref 12.0–15.0)
Immature Granulocytes: 0 %
Lymphocytes Relative: 39 %
Lymphs Abs: 2.3 10*3/uL (ref 0.7–4.0)
MCH: 28.7 pg (ref 26.0–34.0)
MCHC: 31 g/dL (ref 30.0–36.0)
MCV: 92.5 fL (ref 80.0–100.0)
Monocytes Absolute: 1.2 10*3/uL — ABNORMAL HIGH (ref 0.1–1.0)
Monocytes Relative: 20 %
Neutro Abs: 2.4 10*3/uL (ref 1.7–7.7)
Neutrophils Relative %: 40 %
Platelet Count: 183 10*3/uL (ref 150–400)
RBC: 3.87 MIL/uL (ref 3.87–5.11)
RDW: 20.5 % — ABNORMAL HIGH (ref 11.5–15.5)
WBC Count: 6 10*3/uL (ref 4.0–10.5)
nRBC: 0 % (ref 0.0–0.2)

## 2021-09-14 LAB — LACTATE DEHYDROGENASE: LDH: 140 U/L (ref 98–192)

## 2021-09-17 ENCOUNTER — Other Ambulatory Visit (HOSPITAL_COMMUNITY): Payer: Self-pay

## 2021-09-20 ENCOUNTER — Encounter: Payer: Self-pay | Admitting: Hematology

## 2021-09-20 ENCOUNTER — Other Ambulatory Visit: Payer: Self-pay | Admitting: Family Medicine

## 2021-09-20 NOTE — Progress Notes (Signed)
HEMATOLOGY/ONCOLOGY CLINIC NOTE  Date of Service: .09/14/2021   Patient Care Team: Alcus Dad, MD as PCP - General (Family Medicine) Sueanne Margarita, MD as PCP - Cardiology (Cardiology) Danna Hefty, DO (Family Medicine)  CHIEF COMPLAINTS/PURPOSE OF CONSULTATION:  F/u for CLL  HISTORY OF PRESENTING ILLNESS:  [name] [name] is a wonderful 75 y.o. female smoker with history of CLL who has been referred to [place] by Dr Alcus Dad, MD at [facility] for evaluation and management of elevated WBC/lymphocytes.   She is accompanied by her husband. She reported to ED on 11/27/2017 with cough and abdominal pain with COPD exacerbation. Her labs on this day showed WBC elevated at 62.5. She has not previously seen a hematologist. She reports a plan to quit smoking on the first day of the new year. She reports worsening decrease in balance and states she has a cane and walker that she uses as needed. She was taking Prednisone.  No weight loss/fevers/chills/night sweats/ palpable lumps or bumps.  On review of systems, pt reports back pain, change in balance, abdominal pain, weight loss and denies changes in BM, fever and any other accompanying symptoms.   INTERVAL HISTORY:  [name] [name] is here for management and evaluation of her Chronic Leukocytic Leukemia. The patient's last visit with [place] was on 08/21/2021 . We are joined today by her husband.  The pt reports she was seen by her primary care physician and some of her blood pressure medications were adjusted due to fatigue and dizziness.  No other acute new symptoms since her last visit. Patient has not had any issues tolerating her venetoclax at this time.  No diarrhea no nausea no vomiting. No fevers no chills no night sweats no unexpected weight loss.  Lab results today reviewed with the patient  MEDICAL HISTORY:  Past Medical History:  Diagnosis Date   Allergy    environmental   Asthma    Brain tumor ([place])     CKD (chronic kidney disease)    CLL (chronic lymphoblastic leukemia)    CLL (chronic lymphocytic leukemia) (HCC)    Constipation 11/15/2011   COPD (chronic obstructive pulmonary disease) (HCC)    CVA (cerebral vascular accident) ([place])    Diabetes ([place])    Diabetes mellitus without complication (HCC)    GERD (gastroesophageal reflux disease)    Headache    migraines   Heart murmur    Hyperlipidemia    Hypertension    Leukemia ([place])    PVD (peripheral vascular disease) ([place])    Stroke ([place]) 1998   in 1998 due to tumor-right side    SURGICAL HISTORY: Past Surgical History:  Procedure Laterality Date   ABDOMINAL AORTOGRAM W/LOWER EXTREMITY Bilateral 08/12/2020   Procedure: ABDOMINAL AORTOGRAM W/BILATERAL LOWER EXTREMITY RUNOFF;  Surgeon: Serafina Mitchell, MD;  Location: [facility];  Service: Cardiovascular;  Laterality: Bilateral;   ABI  02/2012   ABI <0.65 BL 02/2012   BRAIN MENINGIOMA EXCISION     BRAIN TUMOR EXCISION     Bypass grafting of RLE for PAD claudication      CARDIAC CATHETERIZATION  04/09/208   CESAREAN SECTION     1974, 78 ,80   CHOLECYSTECTOMY, LAPAROSCOPIC     COLONOSCOPY  2014   [place], [place]   ENDARTERECTOMY FEMORAL Right 10/03/2020   Procedure: RIGHT FEMORAL ENDARTERECTOMY WITH VEIN PATCH ANGIOPLASTY;  Surgeon: Serafina Mitchell, MD;  Location: [facility];  Service: Vascular;  Laterality: Right;  ESOPHAGEAL DILATION     FEMORAL-POPLITEAL BYPASS GRAFT Right 10/03/2020   Procedure: RIGHT FEMORAL- BELOW KNEE POPLITEAL BYPASS USING GORE PROPATEN 6MM GRAFT;  Surgeon: Serafina Mitchell, MD;  Location: [facility];  Service: Vascular;  Laterality: Right;   PATCH ANGIOPLASTY  10/03/2020   Procedure: VEIN PATCH ANGIOPLASTY;  Surgeon: Serafina Mitchell, MD;  Location: [facility];  Service: Vascular;;    SOCIAL HISTORY: Social History   Socioeconomic History   Marital status: Single    Spouse name: Not on file   Number of children: 3   Years of education: Not on file    Highest education level: Not on file  Occupational History   Occupation: DISABLED    Employer: DISABLED  Tobacco Use   Smoking status: Every Day    Packs/day: 0.25    Years: 30.00    Pack years: 7.50    Types: Cigarettes    Start date: 01/01/1976   Smokeless tobacco: Never   Tobacco comments:    ~ 3 cigarettes / day  Vaping Use   Vaping Use: Never used  Substance and Sexual Activity   Alcohol use: Not Currently   Drug use: Not Currently   Sexual activity: Yes  Other Topics Concern   Not on file  Social History Narrative   ** Merged History Encounter **       Health Care POA:  Emergency Contact: Val Riles [phone] (c) End of Life Plan:  Who lives with you: Lives with husband Any pets: none Diet: Patient lacks financial resources for much food. Pt reports eating what is available. Exercise: Patient    does not have an exercise plan. Seatbelts: Patient reports wearing seatbelt when in vehicle.  [name] Exposure/Protection: Hobbies: Bowling, computer games, Bingo  Has financial difficulties and transportation issues as she and her husband share transpo   rtation      Social Determinants of Radio broadcast assistant Strain: Not on file  Food Insecurity: Not on file  Transportation Needs: Not on file  Physical Activity: Not on file  Stress: Not on file  Social Connections: Not on file  Intimate Partner Violence: Not on file    FAMILY HISTORY: Family History  Problem Relation Age of Onset   Heart disease Mother    Asthma Mother    Cancer Mother        uterine    Depression Mother    Heart attack Mother 25   Hyperlipidemia Mother    Hypertension Mother    Stroke Mother    Kidney disease Mother    Heart attack Sister    Stroke Sister    Depression Sister    Diabetes Sister    Hyperlipidemia Sister    Depression Sister    Diabetes Sister    Hyperlipidemia Sister    HIV/AIDS Sister    Diabetes Brother    Asthma Brother    Hypertension Brother     Cancer Maternal Aunt        lung   Heart disease Maternal Grandmother    Heart attack Maternal Grandmother    Cancer Brother        colon   HIV/AIDS Brother    Colon cancer Neg Hx     ALLERGIES:  is allergic to gazyva [obinutuzumab], aspirin, no healthtouch food allergies, aspirin, and baclofen.  MEDICATIONS:  Current Outpatient Medications  Medication Sig Dispense Refill   acetaminophen (TYLENOL) 500 MG tablet Take 500-1,000 mg by mouth every 6 (six) hours as needed  for moderate pain.      albuterol (PROVENTIL) (2.5 MG/3ML) 0.083% nebulizer solution Take by nebulization every 6 (six) hours as needed for wheezing or shortness of breath. 90 mL 1   albuterol (VENTOLIN HFA) 108 (90 Base) MCG/ACT inhaler Inhale 2 puffs into the lungs every 6 (six) hours as needed for wheezing or shortness of breath. 8.5 each 11   Alcohol Swabs (ALCOHOL PADS) 70 % PADS SMARTSIG:Pledget(s) Topical Daily     allopurinol (ZYLOPRIM) 300 MG tablet Take 0.5 tablets (150 mg total) by mouth 2 (two) times daily. 30 tablet 1   atorvastatin (LIPITOR) 80 MG tablet Take 1 tablet by mouth every day 30 tablet 6   b complex vitamins capsule Take 1 capsule by mouth daily. 30 capsule 5   Blood Glucose Calibration (OT ULTRA/FASTTK CNTRL SOLN) SOLN See admin instructions.     Blood Glucose Monitoring Suppl (ONETOUCH VERIO) w/Device KIT 1 Device by Does not apply route daily. 1 kit 0   BREO ELLIPTA 100-25 MCG/INH AEPB Inhale 1 puff into the lungs daily.     clopidogrel (PLAVIX) 75 MG tablet Take 75 mg by mouth daily.     diclofenac Sodium (VOLTAREN) 1 % GEL Apply 2 grams topically four times a day 100 g 11   Dulaglutide (TRULICITY) 5.46 FK/8.1EX SOPN Inject 0.75 mg into the skin once a week.     DULoxetine (CYMBALTA) 30 MG capsule Take 1 capsule by mouth every day 30 capsule 11   famotidine (PEPCID) 20 MG tablet Take 20 mg by mouth 2 (two) times daily.     furosemide (LASIX) 20 MG tablet Take 1 tablet (20 mg total) by mouth  daily. 30 tablet 3   gabapentin (NEURONTIN) 300 MG capsule take 2 capsules by mouth twice daily 120 capsule 11   glucose blood (ONETOUCH VERIO) test strip Use to check blood sugar once a day 100 each 12   hydrALAZINE (APRESOLINE) 25 MG tablet Take 1 tablet (25 mg total) by mouth 2 (two) times daily. 60 tablet 1   iron polysaccharides (NIFEREX) 150 MG capsule Take 1 capsule (150 mg total) by mouth daily. 30 capsule 5   JARDIANCE 25 MG TABS tablet Take 25 mg by mouth daily.     Lancet Devices (EASY MINI EJECT LANCING DEVICE) MISC daily.     Lancets (ONETOUCH ULTRASOFT) lancets Use to check blood sugar once a day 100 each 12   lidocaine (XYLOCAINE) 5 % ointment Apply 1 application topically as needed. Apply to R>L feet when burning up to 4x/day as needed 50 g 3   metFORMIN (GLUCOPHAGE) 500 MG tablet Take 1 tablet (500 mg total) by mouth daily. 90 tablet 0   montelukast (SINGULAIR) 10 MG tablet Take 1 tablet (10 mg total) by mouth at bedtime. 30 tablet 11   nitroGLYCERIN (NITROSTAT) 0.4 MG SL tablet Dissolve 1 tablet under tongue every 5 minutes, up to 3 doses for chest pain 25 tablet 11   traMADol (ULTRAM) 50 MG tablet Take 1 tablet (50 mg total) by mouth 2 (two) times daily as needed for severe pain. 30 tablet 0   venetoclax (VENCLEXTA) 100 MG tablet Take 2 tablets (200 mg total) by mouth daily. Tablets should be swallowed whole with a meal and a full glass of water. 60 tablet 2   No current facility-administered medications for this visit.    REVIEW OF SYSTEMS:   .10 Point review of Systems was done is negative except as noted above.   PHYSICAL EXAMINATION:  ECOG FS:2 - Symptomatic, <50% confined to bed  Vitals:   09/14/21 0908  BP: (!) 164/77  Pulse: 69  Resp: 17  Temp: 98 F (36.7 C)  SpO2: 100%    Wt Readings from Last 3 Encounters:  09/14/21 144 lb 12.8 oz (65.7 kg)  08/28/21 139 lb (63 kg)  08/24/21 135 lb 14.4 oz (61.6 kg)   Body mass index is 26.48 kg/m.    .. GENERAL:alert, in no acute distress and comfortable SKIN: no acute rashes, no significant lesions EYES: conjunctiva are pink and non-injected, sclera anicteric OROPHARYNX: MMM, no exudates, no oropharyngeal erythema or ulceration NECK: supple, no JVD LYMPH:  no palpable lymphadenopathy in the cervical, axillary or inguinal regions LUNGS: clear to auscultation b/l with normal respiratory effort HEART: regular rate & rhythm ABDOMEN:  normoactive bowel sounds , non tender, not distended. Extremity: no pedal edema PSYCH: alert & oriented x 3 with fluent speech NEURO: no focal motor/sensory deficits   LABORATORY DATA:  I have reviewed the data as listed   CBC Latest Ref Rng & Units 09/14/2021 08/21/2021 07/27/2021  WBC 4.0 - 10.5 K/uL 6.0 5.5 26.4(H)  Hemoglobin 12.0 - 15.0 g/dL 11.1(L) 11.4(L) 12.7  Hematocrit 36.0 - 46.0 % 35.8(L) 35.9(L) 39.1  Platelets 150 - 400 K/uL 183 180 189    CMP Latest Ref Rng & Units 09/14/2021 08/21/2021 07/27/2021  Glucose 70 - 99 mg/dL 133(H) 137(H) 191(H)  BUN 8 - 23 mg/dL _0 Creatinine 0.44 - 1.00 mg/dL 1.38(H) 1.54(H) 1.03(H)  Sodium 135 - 145 mmol/L 142 141 140  Potassium 3.5 - 5.1 mmol/L 3.7 3.9 3.8  Chloride 98 - 111 mmol/L 104 104 102  CO2 22 - 32 mmol/L _1 Calcium 8.9 - 10.3 mg/dL 8.7(L) 9.3 9.1  Total Protein 6.5 - 8.1 g/dL 6.1(L) 6.3(L) -  Total Bilirubin 0.3 - 1.2 mg/dL 0.4 0.5 -  Alkaline Phos 38 - 126 U/L 156(H) 176(H) -  AST 15 - 41 U/L 21 23 -  ALT 0 - 44 U/L 31 26 -   . Lab Results  Component Value Date   LDH 140 09/14/2021      Component     Latest Ref Rng & Units 02/06/2021  Vitamin B12     180 - 914 pg/mL 448  Haptoglobin     42 - 346 mg/dL 223   Lab Results  Component Value Date   LDH 140 09/14/2021        . Lab Results  Component Value Date   LDH 140 09/14/2021    RADIOGRAPHIC STUDIES: I have personally reviewed the radiological images as listed and agreed with the findings in the  report. VAS [place] ABI WITH/WO TBI  Result Date: 08/24/2021  LOWER EXTREMITY DOPPLER STUDY Patient Name:  SHRUTI ARREY  Date of Exam:   08/24/2021 Medical Rec #: [id number]        Accession #:    [id number] Date of Birth: [birth year]        Patient Gender: F Patient Age:   24 years Exam Location:  [facility] Procedure:      VAS [place] ABI WITH/WO TBI Referring Phys: Harold Barban --------------------------------------------------------------------------------  Indications: Peripheral artery disease. High Risk Factors: Hypertension, hyperlipidemia, Diabetes, current smoker, prior                    CVA.  Vascular Interventions: 10/03/2020: Right femoral to BK popliteal bypass graft.  Right femoral endarterectomy extending into the                         profundafemoral artery with vein angioplasty.                          2009: Right SFA stent.                          Left femoral-popliteal bypass graft done in [place]. Comparison Study: 02/23/2021: Rt ABI 0.99; Lt ABI 0.65 Performing Technologist: Ivan Croft  Examination Guidelines: A complete evaluation includes at minimum, Doppler waveform signals and systolic blood pressure reading at the level of bilateral brachial, anterior tibial, and posterior tibial arteries, when vessel segments are accessible. Bilateral testing is considered an integral part of a complete examination. Photoelectric Plethysmograph (PPG) waveforms and toe systolic pressure readings are included as required and additional duplex testing as needed. Limited examinations for reoccurring indications may be performed as noted.  ABI Findings: +---------+------------------+-----+----------+--------+ Right    Rt Pressure (mmHg)IndexWaveform  Comment  +---------+------------------+-----+----------+--------+ Brachial 156                                       +---------+------------------+-----+----------+--------+ PTA       153               0.98 biphasic           +---------+------------------+-----+----------+--------+ DP       144               0.92 monophasic         +---------+------------------+-----+----------+--------+ Great Toe94                0.60                    +---------+------------------+-----+----------+--------+ +---------+------------------+-----+----------+-------+ Left     Lt Pressure (mmHg)IndexWaveform  Comment +---------+------------------+-----+----------+-------+ Brachial 156                                      +---------+------------------+-----+----------+-------+ PTA      98                0.63 monophasic        +---------+------------------+-----+----------+-------+ DP       101               0.65 monophasic        +---------+------------------+-----+----------+-------+ Great Toe60                0.38                   +---------+------------------+-----+----------+-------+ +-------+-----------+-----------+------------+------------+ ABI/TBIToday's ABIToday's TBIPrevious ABIPrevious TBI +-------+-----------+-----------+------------+------------+ Right  0.98       0.60       0.99        0.64         +-------+-----------+-----------+------------+------------+ Left   0.65       0.38       0.65        0.63         +-------+-----------+-----------+------------+------------+  Bilateral ABIs appear essentially unchanged compared to prior study on 02/23/2021.  Summary: Right: Resting right ankle-brachial index is within normal range. No evidence of significant right lower extremity arterial disease. The right toe-brachial index is abnormal. Left: Resting left ankle-brachial index indicates moderate left lower extremity arterial disease. The left toe-brachial index is abnormal.  *See table(s) above for measurements and observations.  Electronically signed by Harold Barban MD on 08/24/2021 at 49:47:12 PM.    Final    VAS [place] LOWER EXTREMITY  BYPASS GRAFT DUPL  Result Date: 08/24/2021 LOWER EXTREMITY ARTERIAL DUPLEX STUDY Patient Name:  RHODIA ACRES  Date of Exam:   08/24/2021 Medical Rec #: [id number]        Accession #:    [id number] Date of Birth: 03/23/1946        Patient Gender: F Patient Age:   73 years Exam Location:  [facility] Procedure:      VAS [place] LOWER EXTREMITY BYPASS GRAFT DUPLEX Referring Phys: Harold Barban --------------------------------------------------------------------------------  Indications: Peripheral artery disease. High Risk Factors: Hypertension, hyperlipidemia, Diabetes, current smoker, prior                    CVA.  Vascular Interventions: 10/03/2020: Right femoral to BK popliteal bypass graft.                         Right femoral endarterectomy extending into the profunda                         femoral artery with vein angioplasty.                          2009: Right SFA stent.                          Left femoral-popliteal bypass graft done in [place]. Current ABI:            Rt ABI 0.98, Lt ABI 0.65 Comparison Study: 02/23/2021: Patent right femoral to popliteal bypass graft                   with no visualized stenosis. The mid to distal thigh graft was                   not well visualized due to depth. Performing Technologist: Ivan Croft  Examination Guidelines: A complete evaluation includes B-mode imaging, spectral Doppler, color Doppler, and power Doppler as needed of all accessible portions of each vessel. Bilateral testing is considered an integral part of a complete examination. Limited examinations for reoccurring indications may be performed as noted.  Right Graft #1: Femoral-popliteal +------------------+--------+--------+----------+--------+                   PSV cm/sStenosisWaveform  Comments +------------------+--------+--------+----------+--------+ Inflow            121             biphasic            +------------------+--------+--------+----------+--------+ Prox Anastomosis  154             monophasic         +------------------+--------+--------+----------+--------+ Proximal Graft    138  monophasic         +------------------+--------+--------+----------+--------+ Mid Graft         88              monophasic         +------------------+--------+--------+----------+--------+ Distal Graft      94              monophasic         +------------------+--------+--------+----------+--------+ Distal Anastomosis109             monophasic         +------------------+--------+--------+----------+--------+ Outflow           149             monophasic         +------------------+--------+--------+----------+--------+   Summary: Right: Right femoral- BK popliteal bypass graft appears patent with no evidence of stenosis. Limited visualization of the graft in the mid to distal thigh due to depth.  See table(s) above for measurements and observations. Electronically signed by Harold Barban MD on 08/24/2021 at 40:48:32 PM.    Final     Surgical Pathology 12/13/2017   ASSESSMENT & PLAN:   [name] [name] is a wonderful 75 y.o. female with    1. Rai 1 Chronic Lymphocytic Leukemia  - Trisomy 12 mutation. No associated thrombocytopenia.  Mild anemia - likely from acute blood loss from significant epistaxis. No constitutional symptoms. Minimal LNaednopathy No splenomegaly  #2 .Lung nodules on CT in 12/2017. Rpt CT chest 08/09/2018 - stable  #3 Patient Active Problem List   Diagnosis Date Noted   Pleuritic chest pain 08/03/2021   Sepsis ([place]) 07/23/2021   Community acquired pneumonia of right lung    Fall 05/04/2021   Counseling regarding advance care planning and goals of care 02/19/2021   Iron deficiency anemia 02/12/2021   Peripheral arterial disease ([place]) 10/03/2020   (HFpEF) heart failure with preserved ejection fraction ([place]) 06/11/2020   Venous  insufficiency (chronic) (peripheral) 06/22/2019   Pulmonary nodules 01/04/2018   Mass of left lower leg 01/04/2018   Chronic kidney disease (CKD), stage III (moderate) ([place]) 06/04/2016   Chronic back pain 10/02/2015   DM (diabetes mellitus), type 2 with peripheral vascular complications ([place]) 80/32/1224   Chronic lymphocytic leukemia ([place]) 04/11/2009   PERIPHERAL VASCULAR DISEASE 10/13/2007   HYPERCHOLESTEROLEMIA 02/23/2007   ANXIETY 02/23/2007   TOBACCO DEPENDENCE 02/23/2007   Essential hypertension 02/23/2007   COPD (chronic obstructive pulmonary disease) ([place]) 02/23/2007   GASTROESOPHAGEAL REFLUX, NO ESOPHAGITIS 02/23/2007    PLAN: -Discussed pt labwork today, CBC and CMP stable. -No overt lab evidence of CLL progression at this time. -No obvious significant toxicities from her venetoclax at this time. --Continue with 200 mg Venetoclax at this time. Will not escalate this due to improvement in labs and toleration. -Recommended pt drink 48-64 oz water daily. -Continue PO Iron and B-complex daily.  -Counseled on getting the new COVID-19 booster vaccine once that is come out. -Has had Evusheld on 05/04/2021.  FOLLOW UP: RTC with Dr Irene Limbo with labs in 2 months  . The total time spent in the appointment was 20 minutes and more than 50% was on counseling and direct patient cares.   All of the patient's questions were answered with apparent satisfaction. The patient knows to call the clinic with any problems, questions or concerns.    Sullivan Lone MD [place] AAHIVMS [facility] Hematology/Oncology Physician [facility]  (Office):       [phone] (Work cell):  [phone] (Fax):           [phone]

## 2021-09-21 ENCOUNTER — Other Ambulatory Visit (HOSPITAL_COMMUNITY): Payer: Self-pay

## 2021-09-22 ENCOUNTER — Other Ambulatory Visit (HOSPITAL_COMMUNITY): Payer: Self-pay

## 2021-09-24 ENCOUNTER — Other Ambulatory Visit (HOSPITAL_COMMUNITY): Payer: Self-pay

## 2021-09-28 ENCOUNTER — Other Ambulatory Visit (HOSPITAL_COMMUNITY): Payer: Self-pay

## 2021-10-04 ENCOUNTER — Other Ambulatory Visit: Payer: Self-pay | Admitting: Family Medicine

## 2021-10-04 DIAGNOSIS — E1151 Type 2 diabetes mellitus with diabetic peripheral angiopathy without gangrene: Secondary | ICD-10-CM

## 2021-10-06 ENCOUNTER — Other Ambulatory Visit: Payer: Self-pay | Admitting: Family Medicine

## 2021-10-06 DIAGNOSIS — I10 Essential (primary) hypertension: Secondary | ICD-10-CM

## 2021-10-12 ENCOUNTER — Other Ambulatory Visit: Payer: Self-pay

## 2021-10-12 ENCOUNTER — Ambulatory Visit (INDEPENDENT_AMBULATORY_CARE_PROVIDER_SITE_OTHER): Payer: Medicare Other | Admitting: Pharmacist

## 2021-10-12 ENCOUNTER — Encounter: Payer: Self-pay | Admitting: Pharmacist

## 2021-10-12 VITALS — BP 130/70

## 2021-10-12 DIAGNOSIS — Z79899 Other long term (current) drug therapy: Secondary | ICD-10-CM | POA: Diagnosis not present

## 2021-10-12 NOTE — Progress Notes (Signed)
   Subjective:    Patient ID: [name] [name], female    DOB: October 13, 1946, 75 y.o.   MRN: [id number]  HPI  Patient is a 75 y.o. female who presents for medication review and management. She is in good spirits and presents with assistance of cane and her husband. Patient was referred and last seen by Primary Care Provider on 08/28/21.  Medication Adherence Questionnaire (A score of 2 or more points indicates risk for nonadherence)  Do you know what each of your medicines is for? 1 (1 point if no)  Do you ever have trouble remembering to take your medicine? 0; Uses Divvydose (2 points if yes)  Do you ever not take a medicine because you feel you do not need it?  0 (1 point if yes)  Do you think that any of your medicines is not helping you? 0 (1 point if yes)  Do you have any physical problems such as vision loss that keep you from taking your medicines as prescribed?  0 (2 points if yes)  Do you think any of your medicine is causing a side effect? 1; drowsiness (1 point if yes)  Do you know the names of ALL of your medicines? 1 (1 point if no)  Do you think that you need ALL of your medicines? 0 (1 point if no)  In the past 6 months, have you missed getting a refill or a new prescription filled on time? 0 (1 point if yes)  How often do you miss taking a dose of medicine? 0 Never (0 points), 1 or 2 times a month (1 points), 1 time a week (2 points), 2 or more times a week (3 points).   TOTAL SCORE 3/14   Patient's blood pressure log has readings of: 112-140/62-75  Patient's biggest complaint is fatigue and patient dosed off several times during visit. She reports believing this to occur after initiating her Venetoclax which she reports taking in the AM.   Objective:   Labs:   Physical Exam Neurological:     Mental Status: She is alert and oriented to person, place, and time.    Review of Systems  Constitutional:  Positive for malaise/fatigue.   Vitals:   10/12/21 1354  BP: 130/70     Assessment/Plan:   Understanding of regimen: fair  Understanding of indications: fair  Potential of compliance: good  Patient has known adherence challenges based on score of 3 for questionnaire. Barriers include overall lack of knowledge. Medication list reviewed and updated. Upon review of patient's Divvydose and prescription bottles it appears there is a discrepancy as allopurinol is not being packaged. Chart review demonstrates patient should be continuing this medication. Will route to PCP for review and if appropriate new prescription to be sent to [facility].   Hypertension - controlled in office today. No medication changes.  Fatigue - will route message to oncologist, Dr. Irene Limbo, with recommendation if appropriate for patient to take Venetoclax in PM rather than in AM to see if this minimizes fatigue. Of note, gabapentin and tramdolol may also be contributing to fatigue although patient does not attribute it to these medications.   Follow-up appointment with PCP as needed. Written patient instructions provided.  This appointment required 30 minutes of direct patient care.  Thank you for involving pharmacy to assist in providing this patient's care.

## 2021-10-12 NOTE — Patient Instructions (Signed)
Miss Kendricks it was a pleasure seeing you today.   Today we reviewed all of the medications you are currently taking. Included is an updated medication list. Please continue taking all medications as prescribed on this list.  Talk to your Oncologist about switching time of administration of your venclexta to the evening to help with daytime drowsiness  If you have any questions please call the clinic and ask to speak with me.  Follow-up with Dr. Rock Nephew as needed

## 2021-10-14 ENCOUNTER — Other Ambulatory Visit: Payer: Self-pay | Admitting: Family Medicine

## 2021-10-14 NOTE — Progress Notes (Signed)
Removed allopurinol from patient's med list, as it has not been filled by [facility] for at least the past month. Patient with significant polypharmacy and would benefit from simplification of her medication regimen. She has seemingly done well without the allopurinol. No hx of increased uric acid level.

## 2021-10-19 ENCOUNTER — Other Ambulatory Visit (HOSPITAL_COMMUNITY): Payer: Self-pay

## 2021-10-19 ENCOUNTER — Other Ambulatory Visit: Payer: Self-pay

## 2021-10-19 DIAGNOSIS — I10 Essential (primary) hypertension: Secondary | ICD-10-CM

## 2021-10-21 ENCOUNTER — Ambulatory Visit: Payer: Medicare Other | Admitting: Cardiology

## 2021-10-21 ENCOUNTER — Other Ambulatory Visit: Payer: Self-pay

## 2021-10-21 ENCOUNTER — Encounter: Payer: Self-pay | Admitting: Cardiology

## 2021-10-21 VITALS — BP 166/80 | HR 72 | Ht 62.0 in | Wt 137.0 lb

## 2021-10-21 DIAGNOSIS — E7800 Pure hypercholesterolemia, unspecified: Secondary | ICD-10-CM | POA: Diagnosis not present

## 2021-10-21 DIAGNOSIS — I10 Essential (primary) hypertension: Secondary | ICD-10-CM | POA: Diagnosis not present

## 2021-10-21 DIAGNOSIS — I739 Peripheral vascular disease, unspecified: Secondary | ICD-10-CM | POA: Diagnosis not present

## 2021-10-21 DIAGNOSIS — I5032 Chronic diastolic (congestive) heart failure: Secondary | ICD-10-CM | POA: Diagnosis not present

## 2021-10-21 DIAGNOSIS — R079 Chest pain, unspecified: Secondary | ICD-10-CM

## 2021-10-21 LAB — ALT: ALT: 10 IU/L (ref 0–32)

## 2021-10-21 LAB — LIPID PANEL
Chol/HDL Ratio: 2.6 ratio (ref 0.0–4.4)
Cholesterol, Total: 98 mg/dL — ABNORMAL LOW (ref 100–199)
HDL: 37 mg/dL — ABNORMAL LOW (ref >39)
LDL Chol Calc (NIH): 40 mg/dL (ref 0–99)
Triglycerides: 113 mg/dL (ref 0–149)
VLDL Cholesterol Cal: 21 mg/dL (ref 5–40)

## 2021-10-21 MED ORDER — ATORVASTATIN CALCIUM 80 MG PO TABS: 80.0000 mg | ORAL_TABLET | Freq: Every day | ORAL | 3 refills | Status: DC

## 2021-10-21 MED ORDER — HYDRALAZINE HCL 25 MG PO TABS: 25.0000 mg | ORAL_TABLET | Freq: Two times a day (BID) | ORAL | 3 refills | Status: DC

## 2021-10-21 MED ORDER — AMLODIPINE BESYLATE 10 MG PO TABS: 10.0000 mg | ORAL_TABLET | Freq: Every day | ORAL | 3 refills | Status: DC

## 2021-10-21 MED ORDER — FUROSEMIDE 20 MG PO TABS: 20.0000 mg | ORAL_TABLET | Freq: Every day | ORAL | 3 refills | Status: DC

## 2021-10-21 NOTE — Addendum Note (Signed)
Addended by: Antonieta Iba on: 10/21/2021 08:33 AM   Modules accepted: Orders

## 2021-10-21 NOTE — Patient Instructions (Signed)
Medication Instructions:  Your physician has recommended you make the following change in your medication:  1) INCREASE amlodipine to 1 mg daily   *If you need a refill on your cardiac medications before your next appointment, please call your pharmacy*  Lab Work: TODAY: FLP and ALT If you have labs (blood work) drawn today and your tests are completely normal, you will receive your results only by: [place] (if you have MyChart) OR A paper copy in the mail If you have any lab test that is abnormal or we need to change your treatment, we will call you to review the results.  Follow-Up: At [facility], you and your health needs are our priority.  As part of our continuing mission to provide you with exceptional heart care, we have created designated Provider Care Teams.  These Care Teams include your primary Cardiologist (physician) and Advanced Practice Providers (APPs -  Physician Assistants and Nurse Practitioners) who all work together to provide you with the care you need, when you need it.  Your next appointment:   6 month(s)  The format for your next appointment:   In Person  Provider:   You may see Fransico Him, MD or one of the following Advanced Practice Providers on your designated Care Team:   Melina Copa, PA-C Ermalinda Barrios, PA-C  Other Instructions You have been referred to see our PharmD in the hypertension clinic in two weeks.

## 2021-10-21 NOTE — Progress Notes (Signed)
Cardiology Consult Note    Date:  10/21/2021   ID:  [name] [name], DOB [birth year], MRN [id number]  PCP:  Alcus Dad, MD  Cardiologist:  Fransico Him, MD   Chief Complaint  Patient presents with   Congestive Heart Failure   Hypertension   Hyperlipidemia     History of Present Illness:  [name] [name] is a 75 y.o. female  with a hx of CLL, asthma, chronic diastolic CHF, DM2, GERD, HTN, HLD, chronic venous insufficiency, CKD stage 3 and PVD.  She sees Dr. Arita Miss for PVD with LE dopplers showing bilateral SFA occlusions s/p right femoral endarterectomy with patch angioplasty.    She is here today for followup and is doing well.  Since I saw her last she was hospitalized with PNA.  She denies any chest pain until last night while going to take a shower and after she got out of the shower she got pain.  She said it was a heaviness but was worse with deep breath.  There was no associated nausea, diaphoresis or SOB.  She took a SL NTG and it resolved.  She denies any PND, orthopnea,  dizziness, palpitations or syncope.  She has chronic DOE which is very stable.  She also has chronic RLE edema since her PVD surgery which is controlled on diuretics and compression hose.    Past Medical History:  Diagnosis Date   Allergy    environmental   Asthma    Brain tumor ([place])    Chronic diastolic CHF (congestive heart failure) (H[facility])    CKD (chronic kidney disease)    CLL (chronic lymphocytic leukemia) (H[facility])    Constipation 11/15/2011   COPD (chronic obstructive pulmonary disease) (H[facility])    CVA (cerebral vascular accident) ([place])    Diabetes ([place])    GERD (gastroesophageal reflux disease)    Headache    migraines   Hyperlipidemia    Hypertension    PVD (peripheral vascular disease) ([place])    Stroke ([place]) 1998   in 1998 due to tumor-right side    Past Surgical History:  Procedure Laterality Date   ABDOMINAL AORTOGRAM W/LOWER EXTREMITY Bilateral 08/12/2020   Procedure: ABDOMINAL  AORTOGRAM W/BILATERAL LOWER EXTREMITY RUNOFF;  Surgeon: Serafina Mitchell, MD;  Location: [place] [facility];  Service: Cardiovascular;  Laterality: Bilateral;   ABI  02/2012   ABI <0.65 BL 02/2012   BRAIN MENINGIOMA EXCISION     BRAIN TUMOR EXCISION     Bypass grafting of RLE for PAD claudication      CARDIAC CATHETERIZATION  04/09/208   CESAREAN SECTION     1974, 78 ,80   CHOLECYSTECTOMY, LAPAROSCOPIC     COLONOSCOPY  2014   [place], [place]   ENDARTERECTOMY FEMORAL Right 10/03/2020   Procedure: RIGHT FEMORAL ENDARTERECTOMY WITH VEIN PATCH ANGIOPLASTY;  Surgeon: Serafina Mitchell, MD;  Location: [facility];  Service: Vascular;  Laterality: Right;   ESOPHAGEAL DILATION     FEMORAL-POPLITEAL BYPASS GRAFT Right 10/03/2020   Procedure: RIGHT FEMORAL- BELOW KNEE POPLITEAL BYPASS USING GORE PROPATEN 6MM GRAFT;  Surgeon: Serafina Mitchell, MD;  Location: [place];  Service: Vascular;  Laterality: Right;   PATCH ANGIOPLASTY  10/03/2020   Procedure: VEIN PATCH ANGIOPLASTY;  Surgeon: Serafina Mitchell, MD;  Location: [facility];  Service: Vascular;;    Current Medications: Current Meds  Medication Sig   acetaminophen (TYLENOL) 500 MG tablet Take 500-1,000 mg by mouth every 6 (six) hours as needed for moderate pain.  albuterol (PROVENTIL) (2.5 MG/3ML) 0.083% nebulizer solution Take by nebulization every 6 (six) hours as needed for wheezing or shortness of breath.   albuterol (VENTOLIN HFA) 108 (90 Base) MCG/ACT inhaler Inhale 2 puffs into the lungs every 6 (six) hours as needed for wheezing or shortness of breath.   Alcohol Swabs (ALCOHOL PADS) 70 % PADS    atorvastatin (LIPITOR) 80 MG tablet Take 1 tablet by mouth every day   b complex vitamins capsule Take 1 capsule by mouth daily.   Blood Glucose Calibration (OT ULTRA/FASTTK CNTRL SOLN) SOLN See admin instructions.   Blood Glucose Monitoring Suppl (ONETOUCH VERIO) w/Device KIT 1 Device by Does not apply route daily.   BREO ELLIPTA 100-25 MCG/INH AEPB Inhale 1  puff into the lungs daily.   clopidogrel (PLAVIX) 75 MG tablet Take 1 tablet by mouth every day   cyclobenzaprine (FLEXERIL) 10 MG tablet Take 10 mg by mouth 3 (three) times daily.   diclofenac Sodium (VOLTAREN) 1 % GEL Apply 2 grams topically four times a day   Dulaglutide (TRULICITY) 6.00 KH/9.9HF SOPN Inject 0.75 mg into the skin once a week.   DULoxetine (CYMBALTA) 30 MG capsule Take 1 capsule by mouth every day   famotidine (PEPCID) 20 MG tablet Take 20 mg by mouth 2 (two) times daily.   furosemide (LASIX) 20 MG tablet Take 1 tablet (20 mg total) by mouth daily.   gabapentin (NEURONTIN) 300 MG capsule take 2 capsules by mouth twice daily   glucose blood (ONETOUCH VERIO) test strip Use to check blood sugar once a day   hydrALAZINE (APRESOLINE) 25 MG tablet Take 1 tablet by mouth twice daily   iron polysaccharides (NIFEREX) 150 MG capsule Take 1 capsule (150 mg total) by mouth daily.   JARDIANCE 25 MG TABS tablet Take 1 tablet by mouth every day   Lancet Devices (EASY MINI EJECT LANCING DEVICE) MISC daily.   Lancets (ONETOUCH ULTRASOFT) lancets Use to check blood sugar once a day   lidocaine (XYLOCAINE) 5 % ointment Apply 1 application topically as needed. Apply to R>L feet when burning up to 4x/day as needed   metFORMIN (GLUCOPHAGE) 500 MG tablet Take 1 tablet by mouth every day   montelukast (SINGULAIR) 10 MG tablet Take 1 tablet (10 mg total) by mouth at bedtime.   nitroGLYCERIN (NITROSTAT) 0.4 MG SL tablet Dissolve 1 tablet under tongue every 5 minutes, up to 3 doses for chest pain   traMADol (ULTRAM) 50 MG tablet Take 1 tablet (50 mg total) by mouth 2 (two) times daily as needed for severe pain.   venetoclax (VENCLEXTA) 100 MG tablet Take 2 tablets (200 mg total) by mouth daily. Tablets should be swallowed whole with a meal and a full glass of water.    Allergies:   Gazyva [obinutuzumab], Aspirin, No healthtouch food allergies, Aspirin, and Baclofen   Social History   Socioeconomic  History   Marital status: Single    Spouse name: Not on file   Number of children: 3   Years of education: Not on file   Highest education level: Not on file  Occupational History   Occupation: DISABLED    Employer: DISABLED  Tobacco Use   Smoking status: Every Day    Packs/day: 0.25    Years: 30.00    Pack years: 7.50    Types: Cigarettes    Start date: 01/01/1976   Smokeless tobacco: Never   Tobacco comments:    ~ 3 cigarettes / day  Vaping Use  Vaping Use: Never used  Substance and Sexual Activity   Alcohol use: Not Currently   Drug use: Not Currently   Sexual activity: Yes  Other Topics Concern   Not on file  Social History Narrative   ** Merged History Encounter **       Health Care POA:  Emergency Contact: Estel Tonelli [phone] (c) End of Life Plan:  Who lives with you: Lives with husband Any pets: none Diet: Patient lacks financial resources for much food. Pt reports eating what is available. Exercise: Patient    does not have an exercise plan. Seatbelts: Patient reports wearing seatbelt when in vehicle.  [name] Exposure/Protection: Hobbies: Bowling, computer games, Bingo  Has financial difficulties and transportation issues as she and her husband share transpo   rtation      Social Determinants of Radio broadcast assistant Strain: Not on file  Food Insecurity: Not on file  Transportation Needs: Not on file  Physical Activity: Not on file  Stress: Not on file  Social Connections: Not on file     Family History:  The patient's family history includes Asthma in her brother and mother; Cancer in her brother, maternal aunt, and mother; Depression in her mother, sister, and sister; Diabetes in her brother, sister, and sister; HIV/AIDS in her brother and sister; Heart attack in her maternal grandmother and sister; Heart attack (age of onset: 45) in her mother; Heart disease in her maternal grandmother and mother; Hyperlipidemia in her mother, sister, and  sister; Hypertension in her brother and mother; Kidney disease in her mother; Stroke in her mother and sister.   ROS:   Please see the history of present illness.    ROS All other systems reviewed and are negative.  No flowsheet data found.     PHYSICAL EXAM:   VS:  BP (!) 166/80   Pulse 72   Ht '5\' 2"'  (1.575 m)   Wt 137 lb (62.1 kg)   SpO2 95%   BMI 25.06 kg/m    GEN: Well nourished, well developed in no acute distress HEENT: Normal NECK: No JVD; No carotid bruits LYMPHATICS: No lymphadenopathy CARDIAC:RRR, no murmurs, rubs, gallops RESPIRATORY:  Clear to auscultation without rales, wheezing or rhonchi  ABDOMEN: Soft, non-tender, non-distended MUSCULOSKELETAL:  No edema; No deformity  SKIN: Warm and dry NEUROLOGIC:  Alert and oriented x 3 PSYCHIATRIC:  Normal affect   Wt Readings from Last 3 Encounters:  10/21/21 137 lb (62.1 kg)  09/14/21 144 lb 12.8 oz (65.7 kg)  08/28/21 139 lb (63 kg)      Studies/Labs Reviewed:   EKG:  EKG is not ordered today.    Recent Labs: 07/27/2021: Magnesium 2.0 09/14/2021: ALT 31; BUN 13; Creatinine 1.38; Hemoglobin 11.1; Platelet Count 183; Potassium 3.7; Sodium 142   Lipid Panel    Component Value Date/Time   CHOL 103 10/04/2020 0114   CHOL 159 09/26/2020 0925   TRIG 151 (H) 10/04/2020 0114   HDL 24 (L) 10/04/2020 0114   HDL 35 (L) 09/26/2020 0925   CHOLHDL 4.3 10/04/2020 0114   VLDL 30 10/04/2020 0114   LDLCALC 49 10/04/2020 0114   LDLCALC 91 09/26/2020 0925   LDLDIRECT 62 09/24/2010 2055    Additional studies/ records that were reviewed today include:  OV notes and labs from [facility]:    1. Chronic diastolic CHF (congestive heart failure) ([place])   2. Essential hypertension   3. HYPERCHOLESTEROLEMIA   4. PERIPHERAL VASCULAR DISEASE   5.  Chest pain of uncertain etiology      PLAN:  In order of problems listed above:  1.  Chronic diastolic CHF -She does not appear volume overloaded on exam  today. -Weight is down 7 pounds from last month -her last echo 04/2020 showed hyperdynamic LVF with EF 70-75% and moderate LVH and increased filling pressures -I have personally reviewed and interpreted outside labs performed by patient's PCP which showed serum creatinine 1.38 and potassium 3.7 on 09/14/2021 -Continue prescription drug management with Lasix 20 mg daily this was refilled today  2.  HTN -Bp is elevated today  -continue Hydralazine 68m TID> refilled -Increase amlodipine to 10 mg daily -follow-up with Pharm.D. in 2 weeks in hypertension clinic  3.  HLD -LDL goal < 70 due to PVD -Check FLP and ALT -Continue prescription drug management with atorvastatin 40 mg daily -this was refilled today    4.  PVD -followed by Dr. BArita Misswith Vascular surgery -UKoreashowed occlusion of the bilateral SFA occlusions -continue Plavix and statin  5.  Chest pain -she has not had any further CP since last night -she is vague in her description so difficult to sort out -pain was relieved with SL NTG -I will get a Lexiscan myoview to rule out ischemia (given CKD will avoid coronary CTA) -Shared Decision Making/Informed Consent The risks [chest pain, shortness of breath, cardiac arrhythmias, dizziness, blood pressure fluctuations, myocardial infarction, stroke/transient ischemic attack, nausea, vomiting, allergic reaction, radiation exposure, metallic taste sensation and life-threatening complications (estimated to be 1 in 10,000)], benefits (risk stratification, diagnosing coronary artery disease, treatment guidance) and alternatives of a nuclear stress test were discussed in detail with [name] [name] and she agrees to proceed.     Medication Adjustments/Labs and Tests Ordered: Current medicines are reviewed at length with the patient today.  Concerns regarding medicines are outlined above.  Medication changes, Labs and Tests ordered today are listed in the Patient Instructions below.  There are  no Patient Instructions on file for this visit.   Signed, TFransico Him MD  10/21/2021 8:26 AM    [facility] [address] [place]  [id number]: [phone] Fax: [phone]

## 2021-10-23 ENCOUNTER — Other Ambulatory Visit: Payer: Self-pay

## 2021-10-23 ENCOUNTER — Inpatient Hospital Stay: Payer: Medicare Other

## 2021-10-23 ENCOUNTER — Inpatient Hospital Stay: Payer: Medicare Other | Attending: Hematology | Admitting: Hematology

## 2021-10-23 VITALS — BP 167/80 | HR 75 | Temp 97.7°F | Resp 17 | Ht 62.0 in | Wt 139.3 lb

## 2021-10-23 DIAGNOSIS — C9110 Chronic lymphocytic leukemia of B-cell type not having achieved remission: Secondary | ICD-10-CM | POA: Diagnosis present

## 2021-10-23 DIAGNOSIS — R911 Solitary pulmonary nodule: Secondary | ICD-10-CM | POA: Insufficient documentation

## 2021-10-23 DIAGNOSIS — F17210 Nicotine dependence, cigarettes, uncomplicated: Secondary | ICD-10-CM | POA: Diagnosis not present

## 2021-10-23 LAB — CMP (CANCER CENTER ONLY)
ALT: 11 U/L (ref 0–44)
AST: 16 U/L (ref 15–41)
Albumin: 3.6 g/dL (ref 3.5–5.0)
Alkaline Phosphatase: 138 U/L — ABNORMAL HIGH (ref 38–126)
Anion gap: 10 (ref 5–15)
BUN: 15 mg/dL (ref 8–23)
CO2: 25 mmol/L (ref 22–32)
Calcium: 8.9 mg/dL (ref 8.9–10.3)
Chloride: 108 mmol/L (ref 98–111)
Creatinine: 1.55 mg/dL — ABNORMAL HIGH (ref 0.44–1.00)
GFR, Estimated: 35 mL/min — ABNORMAL LOW (ref >60)
Glucose, Bld: 186 mg/dL — ABNORMAL HIGH (ref 70–99)
Potassium: 3.7 mmol/L (ref 3.5–5.1)
Sodium: 143 mmol/L (ref 135–145)
Total Bilirubin: 0.4 mg/dL (ref 0.3–1.2)
Total Protein: 6.3 g/dL — ABNORMAL LOW (ref 6.5–8.1)

## 2021-10-23 LAB — CBC WITH DIFFERENTIAL (CANCER CENTER ONLY)
Abs Immature Granulocytes: 0.02 10*3/uL (ref 0.00–0.07)
Basophils Absolute: 0 10*3/uL (ref 0.0–0.1)
Basophils Relative: 0 %
Eosinophils Absolute: 0.1 10*3/uL (ref 0.0–0.5)
Eosinophils Relative: 2 %
HCT: 40 % (ref 36.0–46.0)
Hemoglobin: 12.6 g/dL (ref 12.0–15.0)
Immature Granulocytes: 0 %
Lymphocytes Relative: 38 %
Lymphs Abs: 1.9 10*3/uL (ref 0.7–4.0)
MCH: 29.1 pg (ref 26.0–34.0)
MCHC: 31.5 g/dL (ref 30.0–36.0)
MCV: 92.4 fL (ref 80.0–100.0)
Monocytes Absolute: 0.8 10*3/uL (ref 0.1–1.0)
Monocytes Relative: 16 %
Neutro Abs: 2.2 10*3/uL (ref 1.7–7.7)
Neutrophils Relative %: 44 %
Platelet Count: 191 10*3/uL (ref 150–400)
RBC: 4.33 MIL/uL (ref 3.87–5.11)
RDW: 17.6 % — ABNORMAL HIGH (ref 11.5–15.5)
WBC Count: 4.9 10*3/uL (ref 4.0–10.5)
nRBC: 0 % (ref 0.0–0.2)

## 2021-10-23 LAB — LACTATE DEHYDROGENASE: LDH: 152 U/L (ref 98–192)

## 2021-10-26 ENCOUNTER — Telehealth: Payer: Self-pay | Admitting: *Deleted

## 2021-10-26 NOTE — Telephone Encounter (Signed)
LVM for pt to call office back to see if she does want her Rx's to be sent to [facility].  We received a request for her medications albuterol, buspirone, cyclobenzaprine, and Trulicity to be sent there. If she does we will send them if not then please confirm the pharmacies we have on file for her and we will notify [facility] to stop sending the requests.   Qamar Rosman Zimmerman Rumple, CMA

## 2021-10-28 ENCOUNTER — Other Ambulatory Visit: Payer: Self-pay

## 2021-10-28 DIAGNOSIS — R0902 Hypoxemia: Secondary | ICD-10-CM | POA: Diagnosis not present

## 2021-10-28 DIAGNOSIS — A419 Sepsis, unspecified organism: Secondary | ICD-10-CM | POA: Diagnosis not present

## 2021-10-28 DIAGNOSIS — R531 Weakness: Secondary | ICD-10-CM | POA: Diagnosis not present

## 2021-10-29 ENCOUNTER — Encounter: Payer: Self-pay | Admitting: Hematology

## 2021-10-29 NOTE — Progress Notes (Signed)
HEMATOLOGY/ONCOLOGY CLINIC NOTE  Date of Service: .10/23/2021   Patient Care Team: Alcus Dad, MD as PCP - General (Family Medicine) Sueanne Margarita, MD as PCP - Cardiology (Cardiology) Danna Hefty, DO (Family Medicine)  CHIEF COMPLAINTS/PURPOSE OF CONSULTATION:  F/u for CLL  HISTORY OF PRESENTING ILLNESS:  [name] [name] is a wonderful 75 y.o. female smoker with history of CLL who has been referred to [place] by Dr Alcus Dad, MD at [facility] for evaluation and management of elevated WBC/lymphocytes.   She is accompanied by her husband. She reported to ED on 11/27/2017 with cough and abdominal pain with COPD exacerbation. Her labs on this day showed WBC elevated at 62.5. She has not previously seen a hematologist. She reports a plan to quit smoking on the first day of the new year. She reports worsening decrease in balance and states she has a cane and walker that she uses as needed. She was taking Prednisone.  No weight loss/fevers/chills/night sweats/ palpable lumps or bumps.  On review of systems, pt reports back pain, change in balance, abdominal pain, weight loss and denies changes in BM, fever and any other accompanying symptoms.   INTERVAL HISTORY:  [name] [name] is here for management and evaluation of her Chronic Leukocytic Leukemia. The patient's last visit with [place] was on 09/14/2021 . We are joined today by her husband.  Patient reports no acute new symptoms.  No new lumps or bumps.  No fevers no chills no night sweats.  No new shortness of breath.  No new abdominal pain or distention. Does not report any diarrhea or other toxicities from her venetoclax at current dose. No other acute new symptoms since her last visit.  Lab results today reviewed with the patient CBC within normal limits with normal WBC count of 4.9k, hemoglobin of 12.6 and platelets of 191k. CMP shows stable chronic kidney disease with creatinine of 1.55. LDH within normal  limits at 152  MEDICAL HISTORY:  Past Medical History:  Diagnosis Date   Allergy    environmental   Asthma    Brain tumor ([place])    Chronic diastolic CHF (congestive heart failure) (HCC)    CKD (chronic kidney disease)    CLL (chronic lymphocytic leukemia) (HCC)    Constipation 11/15/2011   COPD (chronic obstructive pulmonary disease) (HCC)    CVA (cerebral vascular accident) ([place])    Diabetes ([place])    GERD (gastroesophageal reflux disease)    Headache    migraines   Hyperlipidemia    Hypertension    PVD (peripheral vascular disease) ([place])    Stroke ([place]) 1998   in 1998 due to tumor-right side    SURGICAL HISTORY: Past Surgical History:  Procedure Laterality Date   ABDOMINAL AORTOGRAM W/LOWER EXTREMITY Bilateral 08/12/2020   Procedure: ABDOMINAL AORTOGRAM W/BILATERAL LOWER EXTREMITY RUNOFF;  Surgeon: Serafina Mitchell, MD;  Location: [facility];  Service: Cardiovascular;  Laterality: Bilateral;   ABI  02/2012   ABI <0.65 BL 02/2012   BRAIN MENINGIOMA EXCISION     BRAIN TUMOR EXCISION     Bypass grafting of RLE for PAD claudication      CARDIAC CATHETERIZATION  04/09/208   CESAREAN SECTION     1974, 78 ,80   CHOLECYSTECTOMY, LAPAROSCOPIC     COLONOSCOPY  2014   [place]   ENDARTERECTOMY FEMORAL Right 10/03/2020   Procedure: RIGHT FEMORAL ENDARTERECTOMY WITH VEIN PATCH ANGIOPLASTY;  Surgeon: Serafina Mitchell, MD;  Location:  [facility];  Service: Vascular;  Laterality: Right;   ESOPHAGEAL DILATION     FEMORAL-POPLITEAL BYPASS GRAFT Right 10/03/2020   Procedure: RIGHT FEMORAL- BELOW KNEE POPLITEAL BYPASS USING GORE PROPATEN 6MM GRAFT;  Surgeon: Serafina Mitchell, MD;  Location: [facility];  Service: Vascular;  Laterality: Right;   PATCH ANGIOPLASTY  10/03/2020   Procedure: VEIN PATCH ANGIOPLASTY;  Surgeon: Serafina Mitchell, MD;  Location: [facility];  Service: Vascular;;    SOCIAL HISTORY: Social History   Socioeconomic History   Marital status: Single    Spouse name: Not on  file   Number of children: 3   Years of education: Not on file   Highest education level: Not on file  Occupational History   Occupation: DISABLED    Employer: DISABLED  Tobacco Use   Smoking status: Every Day    Packs/day: 0.25    Years: 30.00    Pack years: 7.50    Types: Cigarettes    Start date: 01/01/1976   Smokeless tobacco: Never   Tobacco comments:    ~ 3 cigarettes / day  Vaping Use   Vaping Use: Never used  Substance and Sexual Activity   Alcohol use: Not Currently   Drug use: Not Currently   Sexual activity: Yes  Other Topics Concern   Not on file  Social History Narrative   ** Merged History Encounter **       Health Care POA:  Emergency Contact: Val Riles [phone] (c) End of Life Plan:  Who lives with you: Lives with husband Any pets: none Diet: Patient lacks financial resources for much food. Pt reports eating what is available. Exercise: Patient    does not have an exercise plan. Seatbelts: Patient reports wearing seatbelt when in vehicle.  [name] Exposure/Protection: Hobbies: Bowling, computer games, Bingo  Has financial difficulties and transportation issues as she and her husband share transpo   rtation      Social Determinants of Radio broadcast assistant Strain: Not on file  Food Insecurity: Not on file  Transportation Needs: Not on file  Physical Activity: Not on file  Stress: Not on file  Social Connections: Not on file  Intimate Partner Violence: Not on file    FAMILY HISTORY: Family History  Problem Relation Age of Onset   Heart disease Mother    Asthma Mother    Cancer Mother        uterine    Depression Mother    Heart attack Mother 82   Hyperlipidemia Mother    Hypertension Mother    Stroke Mother    Kidney disease Mother    Heart attack Sister    Stroke Sister    Depression Sister    Diabetes Sister    Hyperlipidemia Sister    Depression Sister    Diabetes Sister    Hyperlipidemia Sister    HIV/AIDS Sister     Diabetes Brother    Asthma Brother    Hypertension Brother    Cancer Maternal Aunt        lung   Heart disease Maternal Grandmother    Heart attack Maternal Grandmother    Cancer Brother        colon   HIV/AIDS Brother    Colon cancer Neg Hx     ALLERGIES:  is allergic to gazyva [obinutuzumab], aspirin, no healthtouch food allergies, aspirin, and baclofen.  MEDICATIONS:  Current Outpatient Medications  Medication Sig Dispense Refill   acetaminophen (TYLENOL) 500 MG tablet Take  500-1,000 mg by mouth every 6 (six) hours as needed for moderate pain.     albuterol (PROVENTIL) (2.5 MG/3ML) 0.083% nebulizer solution Take by nebulization every 6 (six) hours as needed for wheezing or shortness of breath. 90 mL 1   albuterol (VENTOLIN HFA) 108 (90 Base) MCG/ACT inhaler Inhale 2 puffs into the lungs every 6 (six) hours as needed for wheezing or shortness of breath. 8.5 each 11   Alcohol Swabs (ALCOHOL PADS) 70 % PADS      amLODipine (NORVASC) 10 MG tablet Take 1 tablet (10 mg total) by mouth daily. 90 tablet 3   atorvastatin (LIPITOR) 80 MG tablet Take 1 tablet (80 mg total) by mouth daily. 90 tablet 3   b complex vitamins capsule Take 1 capsule by mouth daily. 30 capsule 5   Blood Glucose Calibration (OT ULTRA/FASTTK CNTRL SOLN) SOLN See admin instructions.     Blood Glucose Monitoring Suppl (ONETOUCH VERIO) w/Device KIT 1 Device by Does not apply route daily. 1 kit 0   BREO ELLIPTA 100-25 MCG/INH AEPB Inhale 1 puff into the lungs daily.     clopidogrel (PLAVIX) 75 MG tablet Take 1 tablet by mouth every day 30 tablet 11   cyclobenzaprine (FLEXERIL) 10 MG tablet Take 10 mg by mouth 3 (three) times daily.     diclofenac Sodium (VOLTAREN) 1 % GEL Apply 2 grams topically four times a day 100 g 11   Dulaglutide (TRULICITY) 9.44 HQ/7.5FF SOPN Inject 0.75 mg into the skin once a week.     DULoxetine (CYMBALTA) 30 MG capsule Take 1 capsule by mouth every day 30 capsule 11   famotidine (PEPCID) 20 MG  tablet Take 20 mg by mouth 2 (two) times daily.     furosemide (LASIX) 20 MG tablet Take 1 tablet (20 mg total) by mouth daily. 90 tablet 3   gabapentin (NEURONTIN) 300 MG capsule take 2 capsules by mouth twice daily 120 capsule 11   glucose blood (ONETOUCH VERIO) test strip Use to check blood sugar once a day 100 each 12   hydrALAZINE (APRESOLINE) 25 MG tablet Take 1 tablet (25 mg total) by mouth 2 (two) times daily. 180 tablet 3   iron polysaccharides (NIFEREX) 150 MG capsule Take 1 capsule (150 mg total) by mouth daily. 30 capsule 5   JARDIANCE 25 MG TABS tablet Take 1 tablet by mouth every day 30 tablet 0   Lancet Devices (EASY MINI EJECT LANCING DEVICE) MISC daily.     Lancets (ONETOUCH ULTRASOFT) lancets Use to check blood sugar once a day 100 each 12   lidocaine (XYLOCAINE) 5 % ointment Apply 1 application topically as needed. Apply to R>L feet when burning up to 4x/day as needed 50 g 3   metFORMIN (GLUCOPHAGE) 500 MG tablet Take 1 tablet by mouth every day 30 tablet 0   montelukast (SINGULAIR) 10 MG tablet Take 1 tablet (10 mg total) by mouth at bedtime. 30 tablet 11   nitroGLYCERIN (NITROSTAT) 0.4 MG SL tablet Dissolve 1 tablet under tongue every 5 minutes, up to 3 doses for chest pain 25 tablet 11   traMADol (ULTRAM) 50 MG tablet Take 1 tablet (50 mg total) by mouth 2 (two) times daily as needed for severe pain. 30 tablet 0   venetoclax (VENCLEXTA) 100 MG tablet Take 2 tablets (200 mg total) by mouth daily. Tablets should be swallowed whole with a meal and a full glass of water. 60 tablet 2   No current facility-administered medications for this  visit.    REVIEW OF SYSTEMS:   .10 Point review of Systems was done is negative except as noted above.  PHYSICAL EXAMINATION: ECOG FS:2 - Symptomatic, <50% confined to bed  Vitals:   10/23/21 0841  BP: (!) 167/80  Pulse: 75  Resp: 17  Temp: 97.7 F (36.5 C)  SpO2: 100%    Wt Readings from Last 3 Encounters:  10/23/21 139 lb 4.8  oz (63.2 kg)  10/21/21 137 lb (62.1 kg)  09/14/21 144 lb 12.8 oz (65.7 kg)   Body mass index is 25.48 kg/m.   Marland Kitchen GENERAL:alert, in no acute distress and comfortable SKIN: no acute rashes, no significant lesions EYES: conjunctiva are pink and non-injected, sclera anicteric OROPHARYNX: MMM, no exudates, no oropharyngeal erythema or ulceration NECK: supple, no JVD LYMPH:  no palpable lymphadenopathy in the cervical, axillary or inguinal regions LUNGS: clear to auscultation b/l with normal respiratory effort HEART: regular rate & rhythm ABDOMEN:  normoactive bowel sounds , non tender, not distended. Extremity: no pedal edema PSYCH: alert & oriented x 3 with fluent speech NEURO: no focal motor/sensory deficits    LABORATORY DATA:  I have reviewed the data as listed   CBC Latest Ref Rng & Units 10/23/2021 09/14/2021 08/21/2021  WBC 4.0 - 10.5 K/uL 4.9 6.0 5.5  Hemoglobin 12.0 - 15.0 g/dL 12.6 11.1(L) 11.4(L)  Hematocrit 36.0 - 46.0 % 40.0 35.8(L) 35.9(L)  Platelets 150 - 400 K/uL 191 183 180    CMP Latest Ref Rng & Units 10/23/2021 10/21/2021 09/14/2021  Glucose 70 - 99 mg/dL 186(H) - 133(H)  BUN 8 - 23 mg/dL 15 - 13  Creatinine 0.44 - 1.00 mg/dL 1.55(H) - 1.38(H)  Sodium 135 - 145 mmol/L 143 - 142  Potassium 3.5 - 5.1 mmol/L 3.7 - 3.7  Chloride 98 - 111 mmol/L 108 - 104  CO2 22 - 32 mmol/L 25 - 30  Calcium 8.9 - 10.3 mg/dL 8.9 - 8.7(L)  Total Protein 6.5 - 8.1 g/dL 6.3(L) - 6.1(L)  Total Bilirubin 0.3 - 1.2 mg/dL 0.4 - 0.4  Alkaline Phos 38 - 126 U/L 138(H) - 156(H)  AST 15 - 41 U/L 16 - 21  ALT 0 - 44 U/L _0 Lab Results  Component Value Date   LDH 152 10/23/2021      Component     Latest Ref Rng & Units 02/06/2021  Vitamin B12     180 - 914 pg/mL 448  Haptoglobin     42 - 346 mg/dL 223   Lab Results  Component Value Date   LDH 152 10/23/2021        . Lab Results  Component Value Date   LDH 152 10/23/2021    RADIOGRAPHIC STUDIES: I have  personally reviewed the radiological images as listed and agreed with the findings in the report. No results found.  Surgical Pathology 12/13/2017   ASSESSMENT & PLAN:   TRAEH MILROY is a wonderful 75 y.o. female with    1. Rai 1 Chronic Lymphocytic Leukemia  - Trisomy 12 mutation. No associated thrombocytopenia.  Mild anemia - likely from acute blood loss from significant epistaxis. No constitutional symptoms. Minimal LNaednopathy No splenomegaly  #2 .Lung nodules on CT in 12/2017. Rpt CT chest 08/09/2018 - stable  #3 Patient Active Problem List   Diagnosis Date Noted   Chronic diastolic CHF (congestive heart failure) ([place]) 10/21/2021   Pleuritic chest pain 08/03/2021   Sepsis ([place]) 07/23/2021   Community acquired  pneumonia of right lung    Fall 05/04/2021   Counseling regarding advance care planning and goals of care 02/19/2021   Iron deficiency anemia 02/12/2021   Peripheral arterial disease ([place]) 10/03/2020   (HFpEF) heart failure with preserved ejection fraction ([place]) 06/11/2020   Venous insufficiency (chronic) (peripheral) 06/22/2019   Pulmonary nodules 01/04/2018   Mass of left lower leg 01/04/2018   Chronic kidney disease (CKD), stage III (moderate) ([place]) 06/04/2016   Chronic back pain 10/02/2015   DM (diabetes mellitus), type 2 with peripheral vascular complications ([place]) 78/41/2820   Chronic lymphocytic leukemia ([place]) 04/11/2009   PERIPHERAL VASCULAR DISEASE 10/13/2007   HYPERCHOLESTEROLEMIA 02/23/2007   ANXIETY 02/23/2007   TOBACCO DEPENDENCE 02/23/2007   Essential hypertension 02/23/2007   COPD (chronic obstructive pulmonary disease) ([place]) 02/23/2007   GASTROESOPHAGEAL REFLUX, NO ESOPHAGITIS 02/23/2007    PLAN: -Discussed pt labwork today 10/23/2021, CBC and CMP stable.LDH WNL -No overt lab evidence of CLL progression at this time. -No obvious significant toxicities from her venetoclax at this time. --Continue with 200 mg Venetoclax at this time.   -Recommended pt drink 48-64 oz water daily. -Continue PO Iron and B-complex daily.   FOLLOW UP: RTC with Dr Irene Limbo with labs in 2 months  . The total time spent in the appointment was 20 minutes and more than 50% was on counseling and direct patient cares.   All of the patient's questions were answered with apparent satisfaction. The patient knows to call the clinic with any problems, questions or concerns.    Sullivan Lone MD [place] AAHIVMS [facility] Hematology/Oncology Physician [facility]

## 2021-11-02 MED ORDER — TRAMADOL HCL 50 MG PO TABS: 50.0000 mg | ORAL_TABLET | Freq: Two times a day (BID) | ORAL | 0 refills | Status: DC | PRN

## 2021-11-03 ENCOUNTER — Other Ambulatory Visit: Payer: Self-pay | Admitting: Family Medicine

## 2021-11-05 ENCOUNTER — Other Ambulatory Visit: Payer: Self-pay

## 2021-11-05 ENCOUNTER — Ambulatory Visit: Payer: Medicare Other | Admitting: Pharmacist

## 2021-11-05 VITALS — BP 128/62 | HR 69

## 2021-11-05 DIAGNOSIS — I10 Essential (primary) hypertension: Secondary | ICD-10-CM | POA: Diagnosis not present

## 2021-11-05 DIAGNOSIS — F17200 Nicotine dependence, unspecified, uncomplicated: Secondary | ICD-10-CM

## 2021-11-05 NOTE — Progress Notes (Signed)
Patient ID: [name] [name]                 DOB: 12-08-1946                      MRN: [id number]     HPI: [name] [name] is a 75 y.o. female referred by Dr. Radford Pax to HTN clinic. PMH is significant for CLL, asthma, chronic diastolic CHF, DM2, GERD, HTN, HLD, chronic venous insufficiency, CKD stage 3 and PVD. In August amlodipine was stopped due to low BP, and her hydralazine was decreased from 14m TID to 227mBID. Patient seen by Dr. TuRadford Paxn 10/21/21. BP was 166/80. Amlodipine was increased to 1072maily by Dr. TurRadford Paxthough it appears patient wasn't taking any amlodipine at the time.  Patient presents to the HTN clinic today accompanied by her husband. She brings in her medications pre-packed by [employer] and some medications not prepacked. She has not been checking her BP at home the last several weeks. States she has an upper arm BP cuff. States she gets lightheaded every now and again when standing up. Denies SOB. She has chronic right LLE where she had PV surgery. States it is no worse than before change in amlodipine. She wears compression stockings and tires to elevate her feet. Would like to quite smoking but finds it hard because her husband also smokes and in the past has not been interested in quitting.   Current HTN meds: furosemide 58m48mily, hydralazine 25mg12mce a day, amlodipine 10mg 4my Previously tried: HCTZ 12.5mg (d93mby nephrology due to kidney disease)), metoprolol 25mg, e35mpril (d/c by nephrology due to kidney disease) BP goal: <130/80  Family History: The patient's family history includes Asthma in her brother and mother; Cancer in her brother, maternal aunt, and mother; Depression in her mother, sister, and sister; Diabetes in her brother, sister, and sister; HIV/AIDS in her brother and sister; Heart attack in her maternal grandmother and sister; Heart attack (age of onset: 72) in h64 mother; Heart disease in her maternal grandmother and mother; Hyperlipidemia in her  mother, sister, and sister; Hypertension in her brother and mother; Kidney disease in her mother; Stroke in her mother and sister.   Social History: + tobacco,   Diet: not addressed  Exercise: not addressed  Home BP readings: hasn't been checking  Wt Readings from Last 3 Encounters:  10/23/21 139 lb 4.8 oz (63.2 kg)  10/21/21 137 lb (62.1 kg)  09/14/21 144 lb 12.8 oz (65.7 kg)   BP Readings from Last 3 Encounters:  10/23/21 (!) 167/80  10/21/21 (!) 166/80  10/12/21 130/70   Pulse Readings from Last 3 Encounters:  10/23/21 75  10/21/21 72  09/14/21 69    Renal function: Estimated Creatinine Clearance: 27.8 mL/min (A) (by C-G formula based on SCr of 1.55 mg/dL (H)).  Past Medical History:  Diagnosis Date   Allergy    environmental   Asthma    Brain tumor (HCC)    [place] diastolic CHF (congestive heart failure) (HCC)    CKD (chronic kidney disease)    CLL (chronic lymphocytic leukemia) (HCC)    Constipation 11/15/2011   COPD (chronic obstructive pulmonary disease) (HCC)    CVA (cerebral vascular accident) (HCC)    [place] (HCC)    [place]yman[place] (gastroesophageal reflux disease)    Headache    migraines   Hyperlipidemia    Hypertension    PVD (peripheral vascular disease) (HCC)    [place] (HCC) [address]  in 1998 due to tumor-right side    Current Outpatient Medications on File Prior to Visit  Medication Sig Dispense Refill   acetaminophen (TYLENOL) 500 MG tablet Take 500-1,000 mg by mouth every 6 (six) hours as needed for moderate pain.     albuterol (PROVENTIL) (2.5 MG/3ML) 0.083% nebulizer solution Take by nebulization every 6 (six) hours as needed for wheezing or shortness of breath. 90 mL 1   albuterol (VENTOLIN HFA) 108 (90 Base) MCG/ACT inhaler Inhale 2 puffs into the lungs every 6 (six) hours as needed for wheezing or shortness of breath. 8.5 each 11   Alcohol Swabs (ALCOHOL PADS) 70 % PADS      amLODipine (NORVASC) 10 MG tablet Take 1 tablet (10 mg total) by mouth  daily. 90 tablet 3   atorvastatin (LIPITOR) 80 MG tablet Take 1 tablet (80 mg total) by mouth daily. 90 tablet 3   b complex vitamins capsule Take 1 capsule by mouth daily. 30 capsule 5   Blood Glucose Calibration (OT ULTRA/FASTTK CNTRL SOLN) SOLN See admin instructions.     Blood Glucose Monitoring Suppl (ONETOUCH VERIO) w/Device KIT 1 Device by Does not apply route daily. 1 kit 0   BREO ELLIPTA 100-25 MCG/INH AEPB Inhale 1 puff into the lungs daily.     clopidogrel (PLAVIX) 75 MG tablet Take 1 tablet by mouth every day 30 tablet 11   cyclobenzaprine (FLEXERIL) 10 MG tablet Take 10 mg by mouth 3 (three) times daily.     diclofenac Sodium (VOLTAREN) 1 % GEL Apply 2 grams topically four times a day 100 g 11   Dulaglutide (TRULICITY) 1.28 NO/6.7EH SOPN Inject 0.75 mg into the skin once a week.     DULoxetine (CYMBALTA) 30 MG capsule Take 1 capsule by mouth every day 30 capsule 11   famotidine (PEPCID) 20 MG tablet Take 20 mg by mouth 2 (two) times daily.     furosemide (LASIX) 20 MG tablet Take 1 tablet (20 mg total) by mouth daily. 90 tablet 3   gabapentin (NEURONTIN) 300 MG capsule take 2 capsules by mouth twice daily 120 capsule 11   glucose blood (ONETOUCH VERIO) test strip Use to check blood sugar once a day 100 each 12   hydrALAZINE (APRESOLINE) 25 MG tablet Take 1 tablet (25 mg total) by mouth 2 (two) times daily. 180 tablet 3   iron polysaccharides (NIFEREX) 150 MG capsule Take 1 capsule (150 mg total) by mouth daily. 30 capsule 5   JARDIANCE 25 MG TABS tablet Take 1 tablet by mouth every day 30 tablet 0   Lancet Devices (EASY MINI EJECT LANCING DEVICE) MISC daily.     Lancets (ONETOUCH ULTRASOFT) lancets Use to check blood sugar once a day 100 each 12   lidocaine (XYLOCAINE) 5 % ointment Apply 1 application topically as needed. Apply to R>L feet when burning up to 4x/day as needed 50 g 3   metFORMIN (GLUCOPHAGE) 500 MG tablet Take 1 tablet by mouth every day 30 tablet 0   montelukast  (SINGULAIR) 10 MG tablet Take 1 tablet (10 mg total) by mouth at bedtime. 30 tablet 11   nitroGLYCERIN (NITROSTAT) 0.4 MG SL tablet Dissolve 1 tablet under tongue every 5 minutes, up to 3 doses for chest pain 25 tablet 11   traMADol (ULTRAM) 50 MG tablet Take 1 tablet (50 mg total) by mouth 2 (two) times daily as needed for severe pain. 20 tablet 0   venetoclax (VENCLEXTA) 100 MG tablet Take 2 tablets (  200 mg total) by mouth daily. Tablets should be swallowed whole with a meal and a full glass of water. 60 tablet 2   [DISCONTINUED] prochlorperazine (COMPAZINE) 10 MG tablet Take 1 tablet (10 mg total) by mouth every 6 (six) hours as needed (Nausea or vomiting). 30 tablet 1   No current facility-administered medications on file prior to visit.    Allergies  Allergen Reactions   Gazyva [Obinutuzumab] Shortness Of Breath    Acute respiratory distress   Aspirin     Abdominal pain.    No Healthtouch Food Allergies Diarrhea and Nausea And Vomiting    Cabbage, stomach pain    Aspirin Other (See Comments)    irritates stomach   Baclofen Other (See Comments)    Stomach irritation    There were no vitals taken for this visit.   Assessment/Plan:  1. Hypertension - Patient's blood pressure is at goal of <130/80 today in clinic. I have asked her to start checking her blood pressure at home. I will call her in a few weeks to assess her blood pressures. Continue furosemide 12m daily, hydralazine 280mtwice a day and amlodipine 1073maily.   2. Tobacco abuse- Patient is interested in quitting smoking. States she smokes about 10 cigarettes per day. I advised that she try cutting back by 1 cigarette per day per week. For example for the next week only smoke 9 cigarettes per day, then cut back to 8 and so on. I also calculated how much money per month she would save by qutting.   Thank you  MelRamond Dialharm.D, BCPS, CPP [id number]re[address] CreekC  [id number]: [phone]: [phone]

## 2021-11-05 NOTE — Patient Instructions (Signed)
Continue furosemide 20mg  daily, hydralazine 25mg  twice a day and amlodipine 10mg  daily  Please start checking your blood pressure at home. I will call you in a few weeks to see how your blood pressure is doing  Call me at [phone] with any questions  Hypertension "High blood pressure"  Hypertension is often called "The Silent Killer." It rarely causes symptoms until it is extremely  high or has done damage to other organs in the body. For this reason, you should have your  blood pressure checked regularly by your physician. We will check your blood pressure  every time you see a provider at one of our offices.   Your blood pressure reading consists of two numbers. Ideally, blood pressure should be  below 120/80. The first ("top") number is called the systolic pressure. It measures the  pressure in your arteries as your heart beats. The second ("bottom") number is called the diastolic pressure. It measures the pressure in your arteries as the heart relaxes between beats.  The benefits of getting your blood pressure under control are enormous. A 10-point  reduction in systolic blood pressure can reduce your risk of stroke by 27% and heart failure by 28%  Your blood pressure goal is <130/80  To check your pressure at home you will need to:  1. Sit up in a chair, with feet flat on the floor and back supported. Do not cross your ankles or legs. 2. Rest your left arm so that the cuff is about heart level. If the cuff goes on your upper arm,  then just relax the arm on the table, arm of the chair or your lap. If you have a wrist cuff, we  suggest relaxing your wrist against your chest (think of it as Pledging the Flag with the  wrong arm).  3. Place the cuff snugly around your arm, about 1 inch above the crook of your elbow. The  cords should be inside the groove of your elbow.  4. Sit quietly, with the cuff in place, for about 5 minutes. After that 5 minutes press the power  button  to start a reading. 5. Do not talk or move while the reading is taking place.  6. Record your readings on a sheet of paper. Although most cuffs have a memory, it is often  easier to see a pattern developing when the numbers are all in front of you.  7. You can repeat the reading after 1-3 minutes if it is recommended  Make sure your bladder is empty and you have not had caffeine or tobacco within the last 30 min  Always bring your blood pressure log with you to your appointments. If you have not brought your monitor in to be double checked for accuracy, please bring it to your next appointment.  You can find a list of validated (accurate) blood pressure cuffs at PopPath.it  Healthy Diet  SALT  What is the big deal with sodium? Why the need to limit our intake? What is the connection to  blood pressure? And what is the difference between salt and sodium? Sodium attracts water. Think about it. When you eat an overly salty snack, you tend to become  thirsty and need more water. If you have too much sodium in your bloodstream, your body will  then pull water into the bloodstream as well, trying to correct the imbalance. When you have  more volume in the bloodstream, your blood pressure goes up. Your heart must work harder  to  pump the extra volume, and the increase in pressure can wear out blood vessels faster. You may  also notice bloating and weight gain. Hypertension is one of the leading risk factors for heart  disease. By limiting sodium intake throughout life you are helping decrease your risk of heart  disease later on.   Salt is made up of two minerals. Sodium and chloride. A teaspoon of salt contains about 40%  sodium and 60% chloride. One teaspoon of salt has 2,300 mg of sodium. While the current  USDA guideline states you should consume no more than 2,300 mg per day, both they and the  American Heart Association recommend that you limit this to 1,500 mg to stay healthy. Sea  salt  or [place] pink salt may have a slightly different taste, but they still have almost the same  percent of sodium per teaspoon. So feel free to use them instead of table salt, but don't use  more.  A common myth is that if you don't add salt to your food, you are following a low sodium diet.  However, 75% of the sodium consumed in the American diet is from processed foods, NOT the  salt shaker. We all know that chips and crackers are high in sodium, but there are many other  foods that we may not think of when limiting our sodium. Below are the "salty six" foods that  the American Heart Association wants you to be aware of. 1. Cold cuts - even the healthy sliced [place] can have over 1,000 mg of sodium per slice.   Compare different brands to see which has less sodium if you eat these regularly 2. Pizza - depending on your toppings, a slice of pizza can have up to 760 mg of   sodium. Put more veggies on it or just have a slice with a side salad and still enjoy. 3. Soup - yes even that old home remedy of chicken soup is loaded with sodium. Look   for low sodium versions. Or add a bunch of frozen veggies when heating it, this will   give you less sodium per serving 4. Breads - they may not taste salty, but a single slice of bread can have up to 230 mg of   sodium. Toast for breakfast, a sandwich at lunch and a dinner roll can quickly add up   to over 900 mg in just one day.  5. Chicken - some fresh or frozen chicken is injected with a sodium solution before it   reaches the store. A 4 oz serving should have no more than 100 mg sodium. And   watch for breaded frozen chicken nuggets, strips and tenders. They may seem like a   quick and easy "healthy" meal, but they have high amounts of sodium as well. 6. Burritos/tacos - just 2 teaspoons of taco seasoning can have over 400 mg sodium.   Try making your own with equal parts of cumin, oregano, chili powder and garlic powder.    SUGAR  Sugar is a huge problem in the modern day diet. Sugar is a HUGE contributor to heart disease, diabetes, high triglyceride levels, fatty liver diease and obesity. Sugar is hidden in almost all packaged foods/beverages. It adds no nutritional benefit to your body and can cause major harm. Added sugar is extra sugar that is added beyond what is naturally found. The American Heart Association recommends limiting added sugars to no more than 25g for women and 36  grams for men per day.  There are many names for sugar maltose, sucrose (names ending in "ose"), high fructose corn syrup, molasses, cane sugar, corn sweetener, raw sugar, syrup, honey or fruit juice concentrate.   One of the best ways to limit your added sugars is to stop drinking sweetened beverages such as soda, sweet tea, fruit juice or fancy coffee's. There is 65g of added sugars in one 20oz bottle of Coke!! That is equal to 6 donuts.   Pay attention and read all nutrition facts labels. Below is an examples of a nutrition facts label. The #1 is showing you the total sugars where the # 2 is showing you the added sugars. This one severing has almost the max amount of added sugars per day!  Watch out for items that say "low fat" or "no added sugar" as these products are typically very high in sugar. The food industry uses these terms to fool you into thinking they are healthy.  For more information on the dangers of sugar watch WHY Sugar is as Bad as Alcohol (Fructose, The Liver Toxin) on YouTube.    EXERCISE  Exercise can help lower your blood pressure ~5 points systolic (top #) and 8 points diastolic (bottom #)  Exercise is good. We've all heard that. In an ideal world, we would all have time and resources to  get plenty of it. When you are active your heart pumps more efficiently and you will feel better.  Multiple studies show that even walking regularly has benefits that include living a longer life.  The American Heart  Association recommends 90-150 minutes per week of exercise (30 minutes  per day most days of the week). You can do this in any increment you wish. Nine or more  10-minute walks count. So does an hour-long exercise class. Break the time apart into what will  work in your life. Some of the best things you can do include walking briskly, jogging, cycling or  swimming laps. Not everyone is ready to "exercise." Sometimes we need to start with just getting active. Here  are some easy ways to be more active throughout the day:  Take the stairs instead of the elevator  Go for a 10-15 minute walk during your lunch break (find a friend to make it more enjoyable)  When shopping, park at the back of the parking lot  If you take public transportation, get off one stop early and walk the extra distance  Pace around while making phone calls (most of [place] are not attached to phone cords any longer!) Check with your doctor if you aren't sure what your limitations may be. Always remember to drink plenty of water when doing any type of exercise. Don't feel like a failure if you're not getting the 90-150 minutes per week. If you started by being  a couch potato, then just a 10-minute walk each day is a huge improvement. Start with little  victories and work your way up.   Healthy Eating Tips  When looking to improve your eating habits, whether to lose weight, lower blood pressure or just be healthier, it helps to know what a serving size is.   Grains 1 slice of bread,  bagel,  cup pasta or rice  Vegetables 1 cup fresh or raw vegetables,  cup cooked or canned Fruits 1 piece of medium sized fruit,  cup canned,   Meats/Proteins  cup dried       1 oz meat, 1 egg,  cup cooked beans, nuts or seeds  Dairy        Fats Individual yogurt container, 1 cup (8oz)    1 teaspoon margarine/butter or vegetable  milk or milk alternative, 1 slice of cheese          oil; 1 tablespoon mayonnaise or salad dressing                   Plan ahead: make a menu of the meals for a week then create a grocery list to go with  that menu. Consider meals that easily stretch into a night of leftovers, such as stews or  casseroles. Or consider making two of your favorite meal and put one in the freezer or fridge for  another night.  When you get home from the grocery store wash and prepare your vegetables and fruits.  Then when you need them they are ready to go.  Tips for going to the grocery store:  [place] store or generic brands  Check the weekly ad from your store on-line or in their in-store flyer  Look at the unit price on the shelf tag to compare/contrast the costs of different items  Buy fruits/vegetables in season  Carrots, bananas and apples are low-cost, naturally healthy items  If meats or frozen vegetables are on sale, buy some extras and put in your freezer  Limit buying prepared or "ready to eat" items, even if they are pre-made salads or fruit snacks  Do not shop when you're hungry  Foods at eye level tend to be more expensive. Look on the high and low shelves for deals.  Consider shopping at the farmer's market for fresh foods in season.  Choose canned tuna or salmon instead of fresh  Avoid the cookie and chip aisles (these are expensive, high in calories and low in  nutritional value) Healthy food preparations:  If you can't get lean hamburger, be sure to drain the fat when cooking  Steam, saut (in olive oil), grill or bake foods  Experiment with different seasonings to avoid adding salt to your foods. Kosher salt, sea salt and [place] salt are all still salt and should be avoided          Resources: American Heart Association - InstantFinish.fi Go to the Healthy Living tab to get more information American Diabetes Association - www.diabetes.org You don't have to be diabetic - check out the Food and Fitness tab  DASH diet - https://wilson-eaton.com/ Health topics - or just search on their  home page for DASH Quit for Life - www.cancer.org Follow the Stay Healthy tab to learn more about smoking cessation

## 2021-11-06 ENCOUNTER — Ambulatory Visit (INDEPENDENT_AMBULATORY_CARE_PROVIDER_SITE_OTHER): Payer: Medicare Other | Admitting: Student

## 2021-11-06 ENCOUNTER — Encounter: Payer: Self-pay | Admitting: Student

## 2021-11-06 VITALS — BP 138/68 | HR 72 | Ht 62.0 in | Wt 140.8 lb

## 2021-11-06 DIAGNOSIS — Z23 Encounter for immunization: Secondary | ICD-10-CM

## 2021-11-06 DIAGNOSIS — E1151 Type 2 diabetes mellitus with diabetic peripheral angiopathy without gangrene: Secondary | ICD-10-CM | POA: Diagnosis not present

## 2021-11-06 DIAGNOSIS — J449 Chronic obstructive pulmonary disease, unspecified: Secondary | ICD-10-CM | POA: Diagnosis not present

## 2021-11-06 DIAGNOSIS — Z9981 Dependence on supplemental oxygen: Secondary | ICD-10-CM | POA: Diagnosis not present

## 2021-11-06 LAB — POCT GLYCOSYLATED HEMOGLOBIN (HGB A1C): HbA1c, POC (controlled diabetic range): 7.1 % — AB (ref 0.0–7.0)

## 2021-11-06 NOTE — Patient Instructions (Addendum)
It was great to see you today! Thank you for choosing [facility] for your primary care. [name] [name] was seen for discussing home oxygen devices.  Our plans for today were:  -Your physical exam is unremarkable and it appears that your oxygen status is still 94% while you are up and walking around.  Please continue your daily medications and use of your rescue inhaler for your COPD.  I understand you wanting to switch to the Inogen device for your oxygen needs.  Please discuss with [place] health as I would be happy to fill out any paperwork they require in terms of devices that they may be able to provide you. -Flu vaccination -We checked your hemoglobin A1c which was 7.1 today.  This is still well controlled with your metformin and Jardiance.  No changes to any medications are required at this time.  Please make an appointment for 2 months for your annual wellness examination.  I recommend that you always bring your medications to each appointment as this makes it easy to ensure you are on the correct medications and helps [place] not miss refills when you need them.  Please arrive 15 minutes before your appointment to ensure smooth check in process.  We appreciate your efforts in making this happen.  Take care and seek immediate care sooner if you develop any concerns.   Thank you for allowing me to participate in your care, Wells Guiles, DO 11/06/2021, 10:51 AM PGY-1, [place]

## 2021-11-06 NOTE — Assessment & Plan Note (Signed)
A1c 7.1 today. Goal <8. Continue Metformin 500mg  qd and Jardiance 25mg  qd. Due for diabetic foot exam but unable to be completed today due to presenting with other concerns.

## 2021-11-06 NOTE — Assessment & Plan Note (Signed)
Requesting assistance in obtaining Inogen device in replacement of home O2 tank on trolley. See COPD note for explanation of plan and evaluation approach. Pt uses O2 on as needed basis. Does not have frequent history of acute exacerbations requiring hospitalizations or ED visits.

## 2021-11-06 NOTE — Assessment & Plan Note (Signed)
Chronic, stable at this time. Continue Breo and Singulair daily, and albuterol prn. At rest and ambulatory SpO2 evaluated. Patient tolerated well with ability to keep SpO2 94% during ambulation with quick return to 99% at rest. After discussion with attending, advised pt that, unfortunately, Inogen devices are generally out of pocket. I would still advise access to method of supplemental O2 in the cases of exacerbation during exposure to viral illnesses. Now that she has been evaluated, her insurance is welcome to send paperwork to office to be completed in support of her oxygen needs. Pt understood and agreeable to plan.

## 2021-11-06 NOTE — Progress Notes (Signed)
  SUBJECTIVE:   CHIEF COMPLAINT / HPI:   COPD/Home DME: Pt presents today to discuss home oxygen use. She currently has O2 tank at home with trolley that she does not use frequently. Her COPD is managed with Singulair 10mg  qd, Breo Ellipta 100-61mcg/inh qd, and rescue inhaler which she requires usage of 2-3x/wk in addition to infrequently before going to bed. She discussed with her insurance that she wants to transition to Inogen device rather than having O2 tanks. States she was advised to discuss with PCP. Denies having to need O2 on regular basis in or outside of home.   T2DM: POCT HgbA1c 7.1%. Currently managed with Metformin 500mg  qd and Jardiance 25mg  qd.  PERTINENT  PMH / PSH: CLL, CKD stage 3, T2DM, COPD, tobacco use, HFpEF, T2DM  OBJECTIVE:  BP 138/68   Pulse 72   Ht 5\' 2"  (1.575 m)   Wt 140 lb 12.8 oz (63.9 kg)   SpO2 99%   BMI 25.75 kg/m  Ambulatory SpO2: 94%  General: NAD, pleasant, able to participate in exam Cardiac: RRR, no murmurs. Respiratory: CTAB, normal effort, No wheezes, rales or rhonchi  ASSESSMENT/PLAN:  DM (diabetes mellitus), type 2 with peripheral vascular complications (HCC) M0N 7.1 today. Goal <8. Continue Metformin 500mg  qd and Jardiance 25mg  qd. Due for diabetic foot exam but unable to be completed today due to presenting with other concerns.   COPD (chronic obstructive pulmonary disease) (HCC) Chronic, stable at this time. Continue Breo and Singulair daily, and albuterol prn. At rest and ambulatory SpO2 evaluated. Patient tolerated well with ability to keep SpO2 94% during ambulation with quick return to 99% at rest. After discussion with attending, advised pt that, unfortunately, Inogen devices are generally out of pocket. I would still advise access to method of supplemental O2 in the cases of exacerbation during exposure to viral illnesses. Now that she has been evaluated, her insurance is welcome to send paperwork to office to be completed in support  of her oxygen needs. Pt understood and agreeable to plan.  History of home oxygen therapy Requesting assistance in obtaining Inogen device in replacement of home O2 tank on trolley. See COPD note for explanation of plan and evaluation approach. Pt uses O2 on as needed basis. Does not have frequent history of acute exacerbations requiring hospitalizations or ED visits.  Wells Guiles, DO 11/06/2021, 7:29 PM PGY-1, [place]

## 2021-11-09 ENCOUNTER — Other Ambulatory Visit (HOSPITAL_COMMUNITY): Payer: Self-pay

## 2021-11-11 ENCOUNTER — Other Ambulatory Visit (HOSPITAL_COMMUNITY): Payer: Self-pay

## 2021-11-13 ENCOUNTER — Inpatient Hospital Stay: Payer: Medicare Other | Attending: Hematology

## 2021-11-13 ENCOUNTER — Other Ambulatory Visit (HOSPITAL_COMMUNITY): Payer: Self-pay

## 2021-11-13 ENCOUNTER — Other Ambulatory Visit: Payer: Self-pay | Admitting: Hematology

## 2021-11-13 ENCOUNTER — Inpatient Hospital Stay: Payer: Medicare Other | Admitting: Hematology

## 2021-11-13 ENCOUNTER — Telehealth: Payer: Self-pay | Admitting: Hematology

## 2021-11-13 ENCOUNTER — Other Ambulatory Visit: Payer: Self-pay | Admitting: Family Medicine

## 2021-11-13 ENCOUNTER — Other Ambulatory Visit: Payer: Self-pay

## 2021-11-13 VITALS — BP 135/69 | HR 73 | Temp 98.3°F | Resp 18 | Ht 62.0 in | Wt 143.1 lb

## 2021-11-13 DIAGNOSIS — C9110 Chronic lymphocytic leukemia of B-cell type not having achieved remission: Secondary | ICD-10-CM

## 2021-11-13 DIAGNOSIS — R918 Other nonspecific abnormal finding of lung field: Secondary | ICD-10-CM | POA: Diagnosis not present

## 2021-11-13 DIAGNOSIS — N189 Chronic kidney disease, unspecified: Secondary | ICD-10-CM | POA: Diagnosis not present

## 2021-11-13 LAB — CMP (CANCER CENTER ONLY)
ALT: 34 U/L (ref 0–44)
AST: 23 U/L (ref 15–41)
Albumin: 3.8 g/dL (ref 3.5–5.0)
Alkaline Phosphatase: 182 U/L — ABNORMAL HIGH (ref 38–126)
Anion gap: 9 (ref 5–15)
BUN: 15 mg/dL (ref 8–23)
CO2: 26 mmol/L (ref 22–32)
Calcium: 9.1 mg/dL (ref 8.9–10.3)
Chloride: 104 mmol/L (ref 98–111)
Creatinine: 1.64 mg/dL — ABNORMAL HIGH (ref 0.44–1.00)
GFR, Estimated: 33 mL/min — ABNORMAL LOW (ref >60)
Glucose, Bld: 195 mg/dL — ABNORMAL HIGH (ref 70–99)
Potassium: 3.7 mmol/L (ref 3.5–5.1)
Sodium: 139 mmol/L (ref 135–145)
Total Bilirubin: 0.3 mg/dL (ref 0.3–1.2)
Total Protein: 6.9 g/dL (ref 6.5–8.1)

## 2021-11-13 LAB — CBC WITH DIFFERENTIAL (CANCER CENTER ONLY)
Abs Immature Granulocytes: 0.02 10*3/uL (ref 0.00–0.07)
Basophils Absolute: 0 10*3/uL (ref 0.0–0.1)
Basophils Relative: 0 %
Eosinophils Absolute: 0.1 10*3/uL (ref 0.0–0.5)
Eosinophils Relative: 2 %
HCT: 41.5 % (ref 36.0–46.0)
Hemoglobin: 13 g/dL (ref 12.0–15.0)
Immature Granulocytes: 0 %
Lymphocytes Relative: 35 %
Lymphs Abs: 2.2 10*3/uL (ref 0.7–4.0)
MCH: 28.7 pg (ref 26.0–34.0)
MCHC: 31.3 g/dL (ref 30.0–36.0)
MCV: 91.6 fL (ref 80.0–100.0)
Monocytes Absolute: 1.2 10*3/uL — ABNORMAL HIGH (ref 0.1–1.0)
Monocytes Relative: 18 %
Neutro Abs: 2.9 10*3/uL (ref 1.7–7.7)
Neutrophils Relative %: 45 %
Platelet Count: 188 10*3/uL (ref 150–400)
RBC: 4.53 MIL/uL (ref 3.87–5.11)
RDW: 17.1 % — ABNORMAL HIGH (ref 11.5–15.5)
WBC Count: 6.4 10*3/uL (ref 4.0–10.5)
nRBC: 0 % (ref 0.0–0.2)

## 2021-11-13 LAB — LACTATE DEHYDROGENASE: LDH: 162 U/L (ref 98–192)

## 2021-11-13 MED ORDER — VENETOCLAX 100 MG PO TABS
200.0000 mg | ORAL_TABLET | Freq: Every day | ORAL | 2 refills | Status: DC
  Filled 2021-11-13: qty 60, 30d supply, fill #0

## 2021-11-13 NOTE — Telephone Encounter (Signed)
Scheduled per 11/18 los, message has been left with pt

## 2021-11-17 ENCOUNTER — Other Ambulatory Visit: Payer: Self-pay | Admitting: Family Medicine

## 2021-11-17 DIAGNOSIS — E1151 Type 2 diabetes mellitus with diabetic peripheral angiopathy without gangrene: Secondary | ICD-10-CM

## 2021-11-20 ENCOUNTER — Encounter: Payer: Self-pay | Admitting: Hematology

## 2021-11-23 NOTE — Progress Notes (Signed)
HEMATOLOGY/ONCOLOGY CLINIC NOTE  Date of Service: .11/13/2021   Patient Care Team: Alcus Dad, MD as PCP - General (Family Medicine) Sueanne Margarita, MD as PCP - Cardiology (Cardiology) Danna Hefty, DO (Family Medicine)  CHIEF COMPLAINTS/PURPOSE OF CONSULTATION:  F/u for CLL  HISTORY OF PRESENTING ILLNESS:  [name] [name] is a wonderful 75 y.o. female smoker with history of CLL who has been referred to [place] by Dr Alcus Dad, MD at [facility] for evaluation and management of elevated WBC/lymphocytes.   She is accompanied by her husband. She reported to ED on 11/27/2017 with cough and abdominal pain with COPD exacerbation. Her labs on this day showed WBC elevated at 62.5. She has not previously seen a hematologist. She reports a plan to quit smoking on the first day of the new year. She reports worsening decrease in balance and states she has a cane and walker that she uses as needed. She was taking Prednisone.  No weight loss/fevers/chills/night sweats/ palpable lumps or bumps.  On review of systems, pt reports back pain, change in balance, abdominal pain, weight loss and denies changes in BM, fever and any other accompanying symptoms.   INTERVAL HISTORY:  [name] [name] is here for management and evaluation of her Chronic Leukocytic Leukemia. The patient's last visit with [place] was on 10/23/2021. We are joined today by her husband.  Patient reports no acute new symptoms.  No new lumps or bumps.  No fevers no chills no night sweats.  No new shortness of breath.  No new abdominal pain or distention.  Lab results today 11/13/2021 reviewed with the patient CBC within normal limits with normal WBC count of 6.4 k, hemoglobin of 13 and platelets of 188k. CMP shows stable chronic kidney disease with creatinine of 1.64. LDH within normal limits at 162  MEDICAL HISTORY:  Past Medical History:  Diagnosis Date   Allergy    environmental   Asthma    Brain tumor  ([place])    Chronic diastolic CHF (congestive heart failure) (HCC)    CKD (chronic kidney disease)    CLL (chronic lymphocytic leukemia) (HCC)    Constipation 11/15/2011   COPD (chronic obstructive pulmonary disease) (HCC)    CVA (cerebral vascular accident) ([place])    Diabetes ([place])    GERD (gastroesophageal reflux disease)    Headache    migraines   Hyperlipidemia    Hypertension    PVD (peripheral vascular disease) ([place])    Stroke ([place]) 1998   in 1998 due to tumor-right side    SURGICAL HISTORY: Past Surgical History:  Procedure Laterality Date   ABDOMINAL AORTOGRAM W/LOWER EXTREMITY Bilateral 08/12/2020   Procedure: ABDOMINAL AORTOGRAM W/BILATERAL LOWER EXTREMITY RUNOFF;  Surgeon: Serafina Mitchell, MD;  Location: [place] [facility];  Service: Cardiovascular;  Laterality: Bilateral;   ABI  02/2012   ABI <0.65 BL 02/2012   BRAIN MENINGIOMA EXCISION     BRAIN TUMOR EXCISION     Bypass grafting of RLE for PAD claudication      CARDIAC CATHETERIZATION  04/09/208   CESAREAN SECTION     1974, 78 ,80   CHOLECYSTECTOMY, LAPAROSCOPIC     COLONOSCOPY  2014   [place], [place]   ENDARTERECTOMY FEMORAL Right 10/03/2020   Procedure: RIGHT FEMORAL ENDARTERECTOMY WITH VEIN PATCH ANGIOPLASTY;  Surgeon: Serafina Mitchell, MD;  Location: [place];  Service: Vascular;  Laterality: Right;   ESOPHAGEAL DILATION     FEMORAL-POPLITEAL BYPASS GRAFT Right 10/03/2020  Procedure: RIGHT FEMORAL- BELOW KNEE POPLITEAL BYPASS USING GORE PROPATEN 6MM GRAFT;  Surgeon: Serafina Mitchell, MD;  Location: [facility];  Service: Vascular;  Laterality: Right;   PATCH ANGIOPLASTY  10/03/2020   Procedure: VEIN PATCH ANGIOPLASTY;  Surgeon: Serafina Mitchell, MD;  Location: [facility];  Service: Vascular;;    SOCIAL HISTORY: Social History   Socioeconomic History   Marital status: Single    Spouse name: Not on file   Number of children: 3   Years of education: Not on file   Highest education level: Not on file  Occupational History    Occupation: DISABLED    Employer: DISABLED  Tobacco Use   Smoking status: Every Day    Packs/day: 0.25    Years: 30.00    Pack years: 7.50    Types: Cigarettes    Start date: 01/01/1976   Smokeless tobacco: Never   Tobacco comments:    ~ 3 cigarettes / day  Vaping Use   Vaping Use: Never used  Substance and Sexual Activity   Alcohol use: Not Currently   Drug use: Not Currently   Sexual activity: Yes  Other Topics Concern   Not on file  Social History Narrative   ** Merged History Encounter **       Health Care POA:  Emergency Contact: Val Riles [phone] (c) End of Life Plan:  Who lives with you: Lives with husband Any pets: none Diet: Patient lacks financial resources for much food. Pt reports eating what is available. Exercise: Patient    does not have an exercise plan. Seatbelts: Patient reports wearing seatbelt when in vehicle.  [name] Exposure/Protection: Hobbies: Bowling, computer games, Bingo  Has financial difficulties and transportation issues as she and her husband share transpo   rtation      Social Determinants of Radio broadcast assistant Strain: Not on file  Food Insecurity: Not on file  Transportation Needs: Not on file  Physical Activity: Not on file  Stress: Not on file  Social Connections: Not on file  Intimate Partner Violence: Not on file    FAMILY HISTORY: Family History  Problem Relation Age of Onset   Heart disease Mother    Asthma Mother    Cancer Mother        uterine    Depression Mother    Heart attack Mother 84   Hyperlipidemia Mother    Hypertension Mother    Stroke Mother    Kidney disease Mother    Heart attack Sister    Stroke Sister    Depression Sister    Diabetes Sister    Hyperlipidemia Sister    Depression Sister    Diabetes Sister    Hyperlipidemia Sister    HIV/AIDS Sister    Diabetes Brother    Asthma Brother    Hypertension Brother    Cancer Maternal Aunt        lung   Heart disease Maternal  Grandmother    Heart attack Maternal Grandmother    Cancer Brother        colon   HIV/AIDS Brother    Colon cancer Neg Hx     ALLERGIES:  is allergic to gazyva [obinutuzumab], aspirin, no healthtouch food allergies, aspirin, and baclofen.  MEDICATIONS:  Current Outpatient Medications  Medication Sig Dispense Refill   JARDIANCE 25 MG TABS tablet Take 1 tablet by mouth every day 30 tablet 11   metFORMIN (GLUCOPHAGE) 500 MG tablet Take 1 tablet by mouth every day  30 tablet 11   acetaminophen (TYLENOL) 500 MG tablet Take 500-1,000 mg by mouth every 6 (six) hours as needed for moderate pain.     albuterol (PROVENTIL) (2.5 MG/3ML) 0.083% nebulizer solution Take by nebulization every 6 (six) hours as needed for wheezing or shortness of breath. 90 mL 1   albuterol (VENTOLIN HFA) 108 (90 Base) MCG/ACT inhaler Inhale 2 puffs into the lungs every 6 (six) hours as needed for wheezing or shortness of breath. 8.5 each 11   Alcohol Swabs (ALCOHOL PADS) 70 % PADS      amLODipine (NORVASC) 10 MG tablet Take 1 tablet (10 mg total) by mouth daily. 90 tablet 3   atorvastatin (LIPITOR) 80 MG tablet Take 1 tablet (80 mg total) by mouth daily. 90 tablet 3   b complex vitamins capsule Take 1 capsule by mouth daily. (Patient not taking: Reported on 11/05/2021) 30 capsule 5   Blood Glucose Calibration (OT ULTRA/FASTTK CNTRL SOLN) SOLN See admin instructions.     Blood Glucose Monitoring Suppl (ONETOUCH VERIO) w/Device KIT 1 Device by Does not apply route daily. 1 kit 0   BREO ELLIPTA 100-25 MCG/INH AEPB Inhale 1 puff into the lungs daily.     clopidogrel (PLAVIX) 75 MG tablet Take 1 tablet by mouth every day 30 tablet 11   cyclobenzaprine (FLEXERIL) 10 MG tablet Take 10 mg by mouth 3 (three) times daily.     diclofenac Sodium (VOLTAREN) 1 % GEL Apply 2 grams topically four times a day 100 g 11   Dulaglutide (TRULICITY) 3.70 WU/8.8BV SOPN Inject 0.75 mg into the skin once a week. (Patient not taking: Reported on  11/05/2021)     DULoxetine (CYMBALTA) 30 MG capsule Take 1 capsule by mouth every day 30 capsule 11   famotidine (PEPCID) 20 MG tablet Take 20 mg by mouth 2 (two) times daily.     furosemide (LASIX) 20 MG tablet Take 1 tablet (20 mg total) by mouth daily. 90 tablet 3   gabapentin (NEURONTIN) 300 MG capsule take 2 capsules by mouth twice daily 120 capsule 11   glucose blood (ONETOUCH VERIO) test strip Use to check blood sugar once a day 100 each 12   hydrALAZINE (APRESOLINE) 25 MG tablet Take 1 tablet (25 mg total) by mouth 2 (two) times daily. 180 tablet 3   iron polysaccharides (NIFEREX) 150 MG capsule Take 1 capsule (150 mg total) by mouth daily. (Patient not taking: Reported on 11/05/2021) 30 capsule 5   Lancet Devices (EASY MINI EJECT LANCING DEVICE) MISC daily.     Lancets (ONETOUCH ULTRASOFT) lancets Use to check blood sugar once a day 100 each 12   lidocaine (XYLOCAINE) 5 % ointment Apply 1 application topically as needed. Apply to R>L feet when burning up to 4x/day as needed 50 g 3   montelukast (SINGULAIR) 10 MG tablet Take 1 tablet (10 mg total) by mouth at bedtime. 30 tablet 11   nitroGLYCERIN (NITROSTAT) 0.4 MG SL tablet Dissolve 1 tablet under tongue every 5 minutes, up to 3 doses for chest pain 25 tablet 11   traMADol (ULTRAM) 50 MG tablet Take 1 tablet (50 mg total) by mouth 2 (two) times daily as needed for severe pain. 20 tablet 0   venetoclax (VENCLEXTA) 100 MG tablet Take 2 tablets (200 mg total) by mouth daily. Tablets should be swallowed whole with a meal and a full glass of water. 60 tablet 2   No current facility-administered medications for this visit.    REVIEW  OF SYSTEMS:   .10 Point review of Systems was done is negative except as noted above.   PHYSICAL EXAMINATION: ECOG FS:2 - Symptomatic, <50% confined to bed  Vitals:   11/13/21 1214  BP: 135/69  Pulse: 73  Resp: 18  Temp: 98.3 F (36.8 C)  SpO2: 100%    Wt Readings from Last 3 Encounters:  11/13/21  143 lb 1.6 oz (64.9 kg)  11/06/21 140 lb 12.8 oz (63.9 kg)  10/23/21 139 lb 4.8 oz (63.2 kg)   Body mass index is 26.17 kg/m.   Marland Kitchen GENERAL:alert, in no acute distress and comfortable SKIN: no acute rashes, no significant lesions EYES: conjunctiva are pink and non-injected, sclera anicteric OROPHARYNX: MMM, no exudates, no oropharyngeal erythema or ulceration NECK: supple, no JVD LYMPH:  no palpable lymphadenopathy in the cervical, axillary or inguinal regions LUNGS: clear to auscultation b/l with normal respiratory effort HEART: regular rate & rhythm ABDOMEN:  normoactive bowel sounds , non tender, not distended. Extremity: no pedal edema PSYCH: alert & oriented x 3 with fluent speech NEURO: no focal motor/sensory deficits  LABORATORY DATA:  I have reviewed the data as listed   CBC Latest Ref Rng & Units 11/13/2021 10/23/2021 09/14/2021  WBC 4.0 - 10.5 K/uL 6.4 4.9 6.0  Hemoglobin 12.0 - 15.0 g/dL 13.0 12.6 11.1(L)  Hematocrit 36.0 - 46.0 % 41.5 40.0 35.8(L)  Platelets 150 - 400 K/uL 188 191 183    CMP Latest Ref Rng & Units 11/13/2021 10/23/2021 10/21/2021  Glucose 70 - 99 mg/dL 195(H) 186(H) -  BUN 8 - 23 mg/dL 15 15 -  Creatinine 0.44 - 1.00 mg/dL 1.64(H) 1.55(H) -  Sodium 135 - 145 mmol/L 139 143 -  Potassium 3.5 - 5.1 mmol/L 3.7 3.7 -  Chloride 98 - 111 mmol/L 104 108 -  CO2 22 - 32 mmol/L 26 25 -  Calcium 8.9 - 10.3 mg/dL 9.1 8.9 -  Total Protein 6.5 - 8.1 g/dL 6.9 6.3(L) -  Total Bilirubin 0.3 - 1.2 mg/dL 0.3 0.4 -  Alkaline Phos 38 - 126 U/L 182(H) 138(H) -  AST 15 - 41 U/L 23 16 -  ALT 0 - 44 U/L 34 11 10    Lab Results  Component Value Date   LDH 162 11/13/2021      Component     Latest Ref Rng & Units 02/06/2021  Vitamin B12     180 - 914 pg/mL 448  Haptoglobin     42 - 346 mg/dL 223   Lab Results  Component Value Date   LDH 162 11/13/2021        . Lab Results  Component Value Date   LDH 162 11/13/2021    RADIOGRAPHIC STUDIES: I  have personally reviewed the radiological images as listed and agreed with the findings in the report. No results found.  Surgical Pathology 12/13/2017   ASSESSMENT & PLAN:   [name] [name] is a wonderful 75 y.o. female with    1. Rai 1 Chronic Lymphocytic Leukemia  - Trisomy 12 mutation. No associated thrombocytopenia.  Mild anemia - likely from acute blood loss from significant epistaxis. No constitutional symptoms. Minimal LNaednopathy No splenomegaly  #2 .Lung nodules on CT in 12/2017. Rpt CT chest 08/09/2018 - stable  #3 Patient Active Problem List   Diagnosis Date Noted   History of home oxygen therapy 11/06/2021   Chronic diastolic CHF (congestive heart failure) ([place]) 10/21/2021   Pleuritic chest pain 08/03/2021   Sepsis ([place]) 07/23/2021  Community acquired pneumonia of right lung    Fall 05/04/2021   Counseling regarding advance care planning and goals of care 02/19/2021   Iron deficiency anemia 02/12/2021   Peripheral arterial disease ([place]) 10/03/2020   (HFpEF) heart failure with preserved ejection fraction ([place]) 06/11/2020   Venous insufficiency (chronic) (peripheral) 06/22/2019   Pulmonary nodules 01/04/2018   Mass of left lower leg 01/04/2018   Chronic kidney disease (CKD), stage III (moderate) ([place]) 06/04/2016   Chronic back pain 10/02/2015   DM (diabetes mellitus), type 2 with peripheral vascular complications ([place]) 32/44/0102   Chronic lymphocytic leukemia ([place]) 04/11/2009   PERIPHERAL VASCULAR DISEASE 10/13/2007   HYPERCHOLESTEROLEMIA 02/23/2007   ANXIETY 02/23/2007   TOBACCO DEPENDENCE 02/23/2007   Essential hypertension 02/23/2007   COPD (chronic obstructive pulmonary disease) ([place]) 02/23/2007   GASTROESOPHAGEAL REFLUX, NO ESOPHAGITIS 02/23/2007    PLAN: -Lab results today 11/13/2021 reviewed with the patient CBC within normal limits with normal WBC count of 6.4 k, hemoglobin of 13 and platelets of 188k. CMP shows stable chronic kidney disease with  creatinine of 1.64. LDH within normal limits at 162 -No overt lab evidence of CLL progression at this time. -No obvious significant toxicities from her venetoclax at this time. --Continue with 200 mg Venetoclax at this time.  -Recommended pt drink 48-64 oz water daily. -Continue PO Iron and B-complex daily.   FOLLOW UP: RTC with Dr Irene Limbo with labs in 2 months  . The total time spent in the appointment was 20 minutes and more than 50% was on counseling and direct patient cares.  All of the patient's questions were answered with apparent satisfaction. The patient knows to call the clinic with any problems, questions or concerns.    Sullivan Lone MD [place] AAHIVMS [facility] Hematology/Oncology Physician [facility]

## 2021-11-27 ENCOUNTER — Telehealth: Payer: Self-pay | Admitting: Pharmacist

## 2021-11-27 DIAGNOSIS — R0902 Hypoxemia: Secondary | ICD-10-CM | POA: Diagnosis not present

## 2021-11-27 DIAGNOSIS — R531 Weakness: Secondary | ICD-10-CM | POA: Diagnosis not present

## 2021-11-27 DIAGNOSIS — A419 Sepsis, unspecified organism: Secondary | ICD-10-CM | POA: Diagnosis not present

## 2021-11-27 NOTE — Telephone Encounter (Signed)
Called patient as a follow up to see how her BP was doing. Also called to see how she was doing on cutting back smoking Left VM for pt to call back.

## 2021-12-01 ENCOUNTER — Other Ambulatory Visit (HOSPITAL_COMMUNITY): Payer: Self-pay

## 2021-12-02 ENCOUNTER — Other Ambulatory Visit: Payer: Self-pay | Admitting: Family Medicine

## 2021-12-02 NOTE — Telephone Encounter (Signed)
Called patient. She only has one BP reading 136/67 HR67 Her husband was in the hospital with blood around his brain. Down to 3 cigarettes per day. Advised she continue amlodipine 10mg  daily, furosemide 20mg  daily and hydralazine 25mg  twice a day. Follow up as needed.

## 2021-12-03 ENCOUNTER — Other Ambulatory Visit (HOSPITAL_COMMUNITY): Payer: Self-pay

## 2021-12-03 ENCOUNTER — Other Ambulatory Visit: Payer: Self-pay | Admitting: Hematology

## 2021-12-03 DIAGNOSIS — C9110 Chronic lymphocytic leukemia of B-cell type not having achieved remission: Secondary | ICD-10-CM

## 2021-12-03 MED ORDER — VENETOCLAX 100 MG PO TABS
200.0000 mg | ORAL_TABLET | Freq: Every day | ORAL | 2 refills | Status: DC
  Filled 2021-12-03: qty 60, 30d supply, fill #0

## 2021-12-04 ENCOUNTER — Other Ambulatory Visit: Payer: Medicare Other

## 2021-12-04 ENCOUNTER — Ambulatory Visit: Payer: Medicare Other | Admitting: Hematology

## 2021-12-07 ENCOUNTER — Other Ambulatory Visit: Payer: Self-pay

## 2021-12-08 ENCOUNTER — Other Ambulatory Visit: Payer: Self-pay

## 2021-12-11 ENCOUNTER — Other Ambulatory Visit: Payer: Self-pay | Admitting: Family Medicine

## 2021-12-17 ENCOUNTER — Other Ambulatory Visit (HOSPITAL_COMMUNITY): Payer: Self-pay

## 2021-12-28 ENCOUNTER — Encounter: Payer: Self-pay | Admitting: Hematology

## 2021-12-31 ENCOUNTER — Other Ambulatory Visit (HOSPITAL_COMMUNITY): Payer: Self-pay

## 2021-12-31 ENCOUNTER — Encounter: Payer: Self-pay | Admitting: Family Medicine

## 2021-12-31 ENCOUNTER — Ambulatory Visit (INDEPENDENT_AMBULATORY_CARE_PROVIDER_SITE_OTHER): Payer: Medicare Other | Admitting: Family Medicine

## 2021-12-31 ENCOUNTER — Other Ambulatory Visit: Payer: Self-pay

## 2021-12-31 VITALS — BP 135/62 | HR 73 | Ht 62.0 in | Wt 144.1 lb

## 2021-12-31 DIAGNOSIS — Z9981 Dependence on supplemental oxygen: Secondary | ICD-10-CM

## 2021-12-31 DIAGNOSIS — M79641 Pain in right hand: Secondary | ICD-10-CM

## 2021-12-31 DIAGNOSIS — Z79899 Other long term (current) drug therapy: Secondary | ICD-10-CM

## 2021-12-31 NOTE — Patient Instructions (Addendum)
It was great to see you!  Things we discussed at today's visit: - You can go to [facility] during normal business hours to have an x-ray of your right hand. They are located in [facility] ([place] Please have this done within the next 1-2 weeks at the latest.  - I will attempt to order the Inogen oxygen concentrator for you. I have placed a referral to our care management team who will call you to discuss details and hopefully assist with your oxygen needs.  - Please bring ALL your medications to every visit. This is very important so that we can take the best possible care of you.  - I would like for you to gradually wean off your Tramadol. I have sent a new prescription for 30 tablets, which should last you at least 2 months. Take only as needed (no more than 1 tablet every other day). Reach out if you have concerns about this.  Take care and seek immediate care sooner if you develop any concerns.  Dr. [facility]

## 2021-12-31 NOTE — Progress Notes (Signed)
° ° °  SUBJECTIVE:   CHIEF COMPLAINT / HPI:   R Hand Pain Started about 1 year ago. Located throughout dorsal right index finger. Present all the time, described as sharp pain. Worse with activity involving her hands. She reports everyone in her family has tendonitis in their hands. No known injury although per chart review had a fall in May 2022 and seems this pain started around that time. She is right hand dominant. No numbness or weakness. Says she has not tried anything for relief. She does take tramadol as needed for chronic back pain (reports she is using this 1-2 times per day).  Oxygen Issues Patient has concerns about her home oxygen. She only uses home oxygen as needed (if feeling very short of breath or if she checks her pulse ox and it's low). Rarely has to use it. States she is being charged 200 something dollars for her large oxygen machine and the company won't remove it unless they have an order for the inogen device. Of note, she has been seen on 2 occasions in the past to discuss her oxygen and the inogen device as well.   OBJECTIVE:   BP 135/62    Pulse 73    Ht 5\' 2"  (1.575 m)    Wt 144 lb 2 oz (65.4 kg)    SpO2 100%    BMI 26.36 kg/m   General: NAD, pleasant, able to participate in exam Respiratory: No respiratory distress Skin: warm and dry, no rashes noted Psych: Normal affect and mood Neuro: grossly intact Right Hand: no obvious swelling or deformity. No skin changes. 5/5 grip strength. Normal strength and ROM throughout fingers and wrist. Significant TTP over entire dorsal aspect of index finger and extending into dorsal hand at base of index finger.   ASSESSMENT/PLAN:   History of home oxygen therapy Patient has home oxygen which she uses very rarely, only on an as-needed basis. Reports she is being charged for the large tank which she really doesn't like and has trouble rolling around with her (walks w/a cane). -Will attempt to order Inogen device -If Inogen  not covered or unable to obtain for some reason, consider placing order to discontinue home oxygen altogether. Patient has had multiple visits with ambulatory O2 sats that do not demonstrate need for supplemental O2 -Referral to CCM to help coordinate her needs  Right hand pain Present for almost 1 year, located on dorsal index finger. No obvious deformity on exam although she has significant TTP throughout the finger. Suspect this may be tendonitis related to how she grips her cane. -x-ray previously ordered by Dr. Tarry Kos. Advised patient to have this done -Consider voltaren gel pending xray results -Consider brace/immobilization of index finger   Chronic Narcotic Use Patient prescribed Tramadol 50mg  BID prn for chronic back pain. Discussed at length that this is not an ideal medication for her to be on long term. She is agreeable to weaning. Currently taking 1-2 daily-- she will wean to 1 every other day and contact me if there are any concerns. Will obtain UDS at next appointment.  Proper med rec unable to be completed today as patient is not familiar with all her medications. This has been a problem in the past as well and she was seen by pharmacy team. Advised her to physically bring all medications to next appointment.   Alcus Dad, MD [place]

## 2022-01-01 ENCOUNTER — Encounter: Payer: Self-pay | Admitting: Hematology

## 2022-01-01 ENCOUNTER — Telehealth: Payer: Self-pay | Admitting: *Deleted

## 2022-01-01 ENCOUNTER — Other Ambulatory Visit (HOSPITAL_COMMUNITY): Payer: Self-pay

## 2022-01-01 ENCOUNTER — Telehealth: Payer: Self-pay

## 2022-01-01 DIAGNOSIS — M79641 Pain in right hand: Secondary | ICD-10-CM | POA: Insufficient documentation

## 2022-01-01 NOTE — Assessment & Plan Note (Signed)
Present for almost 1 year, located on dorsal index finger. No obvious deformity on exam although she has significant TTP throughout the finger. Suspect this may be tendonitis related to how she grips her cane. -x-ray previously ordered by Dr. Tarry Kos. Advised patient to have this done -Consider voltaren gel pending xray results -Consider brace/immobilization of index finger

## 2022-01-01 NOTE — Chronic Care Management (AMB) (Signed)
°  Care Management   Note  01/01/2022 Name: [name] [name] MRN: [id number] DOB: [birth year]  [name] [name] is a 76 y.o. year old female who is a primary care patient of Alcus Dad, MD. I reached out to Octavia Bruckner by phone today in response to a referral sent by [name]'s primary care provider.   [name] [name] was given information about care management services today including:  Care management services include personalized support from designated clinical staff supervised by [name] [name] physician, including individualized plan of care and coordination with other care providers 24/7 contact phone numbers for assistance for urgent and routine care needs. The patient may stop care management services at any time by phone call to the office staff.  Patient agreed to services and verbal consent obtained.   Follow up plan: Telephone appointment with care management team member scheduled for:01/05/22  [place] Management  Direct Dial: [phone]

## 2022-01-01 NOTE — Assessment & Plan Note (Signed)
Patient has home oxygen which she uses very rarely, only on an as-needed basis. Reports she is being charged for the large tank which she really doesn't like and has trouble rolling around with her (walks w/a cane). -Will attempt to order Inogen device -If Inogen not covered or unable to obtain for some reason, consider placing order to discontinue home oxygen altogether. Patient has had multiple visits with ambulatory O2 sats that do not demonstrate need for supplemental O2 -Referral to CCM to help coordinate her needs

## 2022-01-01 NOTE — Telephone Encounter (Signed)
Oral Oncology Patient Advocate Encounter  Was successful in securing patient a $8000 grant from [employer] to provide copayment coverage for Venclexta.  This will keep the out of pocket expense at $0.     [employer] ID: [id number]    The billing information is as follows and has been shared with [place]: [id number] PCN: [id number] Member ID: [id number] Group ID: [id number] Dates of Eligibility: 12/01/21 through 11/30/22  Fund:  [place] Patient [place] Phone [phone] Fax [phone] 01/01/2022 9:25 AM

## 2022-01-01 NOTE — Chronic Care Management (AMB) (Signed)
°  Care Management   Outreach Note  01/01/2022 Name: [name] [name] MRN: [id number] DOB: 09-09-1946  Referred by: Alcus Dad, MD Reason for referral : Care Coordination (Initial outreach to schedule referral with [facility])   An unsuccessful telephone outreach was attempted today. The patient was referred to the case management team for assistance with care management and care coordination.   Follow Up Plan:  A HIPAA compliant phone message was left for the patient providing contact information and requesting a return call.  The care management team will reach out to the patient again over the next 7 days. If patient returns call to provider office, please advise to call [place] at [phone].  [place] Management  Direct Dial: [phone]

## 2022-01-04 ENCOUNTER — Inpatient Hospital Stay: Payer: Medicare Other | Admitting: Hematology

## 2022-01-04 ENCOUNTER — Other Ambulatory Visit: Payer: Self-pay

## 2022-01-04 ENCOUNTER — Encounter: Payer: Self-pay | Admitting: Hematology

## 2022-01-04 ENCOUNTER — Other Ambulatory Visit (HOSPITAL_COMMUNITY): Payer: Self-pay

## 2022-01-04 ENCOUNTER — Inpatient Hospital Stay: Payer: Medicare Other | Attending: Hematology

## 2022-01-04 VITALS — BP 120/78 | HR 73 | Temp 98.6°F | Resp 18 | Wt 143.5 lb

## 2022-01-04 DIAGNOSIS — R918 Other nonspecific abnormal finding of lung field: Secondary | ICD-10-CM | POA: Diagnosis not present

## 2022-01-04 DIAGNOSIS — F17210 Nicotine dependence, cigarettes, uncomplicated: Secondary | ICD-10-CM | POA: Insufficient documentation

## 2022-01-04 DIAGNOSIS — C9110 Chronic lymphocytic leukemia of B-cell type not having achieved remission: Secondary | ICD-10-CM

## 2022-01-04 LAB — CBC WITH DIFFERENTIAL (CANCER CENTER ONLY)
Abs Immature Granulocytes: 0.02 10*3/uL (ref 0.00–0.07)
Basophils Absolute: 0 10*3/uL (ref 0.0–0.1)
Basophils Relative: 1 %
Eosinophils Absolute: 0.1 10*3/uL (ref 0.0–0.5)
Eosinophils Relative: 2 %
HCT: 38.7 % (ref 36.0–46.0)
Hemoglobin: 12.6 g/dL (ref 12.0–15.0)
Immature Granulocytes: 0 %
Lymphocytes Relative: 34 %
Lymphs Abs: 2.2 10*3/uL (ref 0.7–4.0)
MCH: 28.7 pg (ref 26.0–34.0)
MCHC: 32.6 g/dL (ref 30.0–36.0)
MCV: 88.2 fL (ref 80.0–100.0)
Monocytes Absolute: 1 10*3/uL (ref 0.1–1.0)
Monocytes Relative: 16 %
Neutro Abs: 3 10*3/uL (ref 1.7–7.7)
Neutrophils Relative %: 47 %
Platelet Count: 180 10*3/uL (ref 150–400)
RBC: 4.39 MIL/uL (ref 3.87–5.11)
RDW: 17.3 % — ABNORMAL HIGH (ref 11.5–15.5)
WBC Count: 6.4 10*3/uL (ref 4.0–10.5)
nRBC: 0 % (ref 0.0–0.2)

## 2022-01-04 LAB — CMP (CANCER CENTER ONLY)
ALT: 10 U/L (ref 0–44)
AST: 13 U/L — ABNORMAL LOW (ref 15–41)
Albumin: 4.1 g/dL (ref 3.5–5.0)
Alkaline Phosphatase: 143 U/L — ABNORMAL HIGH (ref 38–126)
Anion gap: 9 (ref 5–15)
BUN: 16 mg/dL (ref 8–23)
CO2: 26 mmol/L (ref 22–32)
Calcium: 9.5 mg/dL (ref 8.9–10.3)
Chloride: 107 mmol/L (ref 98–111)
Creatinine: 1.44 mg/dL — ABNORMAL HIGH (ref 0.44–1.00)
GFR, Estimated: 38 mL/min — ABNORMAL LOW (ref >60)
Glucose, Bld: 185 mg/dL — ABNORMAL HIGH (ref 70–99)
Potassium: 3.6 mmol/L (ref 3.5–5.1)
Sodium: 142 mmol/L (ref 135–145)
Total Bilirubin: 0.4 mg/dL (ref 0.3–1.2)
Total Protein: 6.7 g/dL (ref 6.5–8.1)

## 2022-01-04 LAB — LACTATE DEHYDROGENASE: LDH: 130 U/L (ref 98–192)

## 2022-01-04 MED ORDER — VENETOCLAX 100 MG PO TABS
200.0000 mg | ORAL_TABLET | Freq: Every day | ORAL | 2 refills | Status: DC
  Filled 2022-01-04 – 2022-01-08 (×2): qty 60, 30d supply, fill #0
  Filled 2022-01-25: qty 60, 30d supply, fill #1
  Filled 2022-03-15: qty 60, 30d supply, fill #2

## 2022-01-05 ENCOUNTER — Other Ambulatory Visit (HOSPITAL_COMMUNITY): Payer: Self-pay

## 2022-01-05 ENCOUNTER — Encounter: Payer: Self-pay | Admitting: Hematology

## 2022-01-05 ENCOUNTER — Telehealth: Payer: Commercial Managed Care - HMO

## 2022-01-05 ENCOUNTER — Telehealth: Payer: Self-pay

## 2022-01-05 ENCOUNTER — Ambulatory Visit
Admission: RE | Admit: 2022-01-05 | Discharge: 2022-01-05 | Disposition: A | Discharge status: Home / Self Care | Payer: Medicare Other | Source: Ambulatory Visit | Attending: Family Medicine | Admitting: Family Medicine

## 2022-01-05 DIAGNOSIS — W19XXXA Unspecified fall, initial encounter: Secondary | ICD-10-CM

## 2022-01-05 DIAGNOSIS — M79644 Pain in right finger(s): Secondary | ICD-10-CM

## 2022-01-05 NOTE — Telephone Encounter (Signed)
° °  RN Case Manager Care Management   Phone Outreach    01/05/2022 Name: [name] [name] MRN: [id number] DOB: 1946-06-24  [name] [name] is a 76 y.o. year old female who is a primary care patient of Alcus Dad, MD .   Telephone outreach was unsuccessful A HIPPA compliant phone message was left for the patient providing contact information and requesting a return call.   Follow Up Plan: Will route chart to Care Guide to see if patient would like to reschedule phone appointment    Review of patient status, including review of consultants reports, relevant laboratory and other test results, and collaboration with appropriate care team members and the patient's provider was performed as part of comprehensive patient evaluation and provision of care management services.    Lazaro Arms RN, BSN, [facility] Care Management Coordinator [place] Phone: [phone] Fax: [phone]

## 2022-01-07 NOTE — Telephone Encounter (Signed)
Rescheduled for 01/12/22  [place] Management  Direct Dial: [phone]

## 2022-01-08 ENCOUNTER — Other Ambulatory Visit (HOSPITAL_COMMUNITY): Payer: Self-pay

## 2022-01-08 ENCOUNTER — Encounter: Payer: Self-pay | Admitting: Hematology

## 2022-01-10 ENCOUNTER — Encounter: Payer: Self-pay | Admitting: Hematology

## 2022-01-10 NOTE — Progress Notes (Signed)
°  ° °HEMATOLOGY/ONCOLOGY CLINIC NOTE ° °Date of Service: .01/04/2022 ° ° °Patient Care Team: °Wells, Ashleigh, MD as PCP - General (Family Medicine) °Turner, Traci R, MD as PCP - Cardiology (Cardiology) °Mullis, Kiersten P, DO (Family Medicine) °Coles, Traci, RN as Case Manager ° °CHIEF COMPLAINTS/PURPOSE OF CONSULTATION:  °Follow-up for continued evaluation and management of CLL. ° °HISTORY OF PRESENTING ILLNESS:  °[name] [name] is a wonderful 75 y.o. female smoker with history of CLL who has been referred to us by Dr Wells, Ashleigh, MD at [facility] for evaluation and management of elevated WBC/lymphocytes. ° ° She is accompanied by her husband. She reported to ED on 11/27/2017 with cough and abdominal pain with COPD exacerbation. Her labs on this day showed WBC elevated at 62.5. She has not previously seen a hematologist. She reports a plan to quit smoking on the first day of the new year. She reports worsening decrease in balance and states she has a cane and walker that she uses as needed. She was taking Prednisone. ° °No weight loss/fevers/chills/night sweats/ palpable lumps or bumps. ° °On review of systems, pt reports back pain, change in balance, abdominal pain, weight loss and denies changes in BM, fever and any other accompanying symptoms. ° ° °INTERVAL HISTORY: ° °[name] [name] is here for her scheduled follow-up for continued evaluation and management of CLL after her last clinic appointment on 11/13/2021. °She notes no acute new symptoms.  No fevers no chills no night sweats no unexpected weight loss. °No new lumps or bumps.  No abdominal pain or distention. °She notes no new infection issues. °No prohibitive toxicities from her current venetoclax 20 mg p.o. daily. °Labs done today 01/04/2022 showed normal CBC with hemoglobin of 12.6, WBC count of 6.4k and platelets of 180k °CMP stable with chronic kidney disease creatinine of 1.44. °LDH within normal limits at 130 ° ° °MEDICAL HISTORY:   °Past Medical History:  °Diagnosis Date  ° Allergy   ° environmental  ° Asthma   ° Brain tumor (HCC)   ° Chronic diastolic CHF (congestive heart failure) (HCC)   ° CKD (chronic kidney disease)   ° CLL (chronic lymphocytic leukemia) (HCC)   ° Constipation 11/15/2011  ° COPD (chronic obstructive pulmonary disease) (HCC)   ° CVA (cerebral vascular accident) (HCC)   ° Diabetes (HCC)   ° GERD (gastroesophageal reflux disease)   ° Headache   ° migraines  ° Hyperlipidemia   ° Hypertension   ° PVD (peripheral vascular disease) (HCC)   ° Stroke (HCC) 1998  ° in 1998 due to tumor-right side  ° ° °SURGICAL HISTORY: °Past Surgical History:  °Procedure Laterality Date  ° ABDOMINAL AORTOGRAM W/LOWER EXTREMITY Bilateral 08/12/2020  ° Procedure: ABDOMINAL AORTOGRAM W/BILATERAL LOWER EXTREMITY RUNOFF;  Surgeon: Brabham, Vance W, MD;  Location: [facility];  Service: Cardiovascular;  Laterality: Bilateral;  ° ABI  02/2012  ° ABI <0.65 BL 02/2012  ° BRAIN MENINGIOMA EXCISION    ° BRAIN TUMOR EXCISION    ° Bypass grafting of RLE for PAD claudication     ° CARDIAC CATHETERIZATION  04/09/208  ° CESAREAN SECTION    ° 1974, 77 ,79  ° CHOLECYSTECTOMY, LAPAROSCOPIC    ° COLONOSCOPY  2014  ° [place]  ° ENDARTERECTOMY FEMORAL Right 10/03/2020  ° Procedure: RIGHT FEMORAL ENDARTERECTOMY WITH VEIN PATCH ANGIOPLASTY;  Surgeon: Brabham, Vance W, MD;  Location: [facility] OR;  Service: Vascular;  Laterality: Right;  ° ESOPHAGEAL DILATION    °   FEMORAL-POPLITEAL BYPASS GRAFT Right 10/03/2020   Procedure: RIGHT FEMORAL- BELOW KNEE POPLITEAL BYPASS USING GORE PROPATEN 6MM GRAFT;  Surgeon: Serafina Mitchell, MD;  Location: [facility] OR;  Service: Vascular;  Laterality: Right;   PATCH ANGIOPLASTY  10/03/2020   Procedure: VEIN PATCH ANGIOPLASTY;  Surgeon: Serafina Mitchell, MD;  Location: [facility] OR;  Service: Vascular;;    SOCIAL HISTORY: Social History   Socioeconomic History   Marital status: Single    Spouse name: Not on file   Number of children: 3    Years of education: Not on file   Highest education level: Not on file  Occupational History   Occupation: DISABLED    Employer: DISABLED  Tobacco Use   Smoking status: Every Day    Packs/day: 0.25    Years: 30.00    Pack years: 7.50    Types: Cigarettes    Start date: 01/01/1976   Smokeless tobacco: Never   Tobacco comments:    ~ 3 cigarettes / day  Vaping Use   Vaping Use: Never used  Substance and Sexual Activity   Alcohol use: Not Currently   Drug use: Not Currently   Sexual activity: Yes  Other Topics Concern   Not on file  Social History Narrative   ** Merged History Encounter **       Health Care POA:  Emergency Contact: [name] [name] [phone] (c) End of Life Plan:  Who lives with you: Lives with husband Any pets: none Diet: Patient lacks financial resources for much food. Pt reports eating what is available. Exercise: Patient    does not have an exercise plan. Seatbelts: Patient reports wearing seatbelt when in vehicle.  [name] Exposure/Protection: Hobbies: Bowling, computer games, Bingo  Has financial difficulties and transportation issues as she and her husband share transpo   rtation      Social Determinants of Radio broadcast assistant Strain: Not on file  Food Insecurity: Not on file  Transportation Needs: Not on file  Physical Activity: Not on file  Stress: Not on file  Social Connections: Not on file  Intimate Partner Violence: Not on file    FAMILY HISTORY: Family History  Problem Relation Age of Onset   Heart disease Mother    Asthma Mother    Cancer Mother        uterine    Depression Mother    Heart attack Mother 41   Hyperlipidemia Mother    Hypertension Mother    Stroke Mother    Kidney disease Mother    Heart attack Sister    Stroke Sister    Depression Sister    Diabetes Sister    Hyperlipidemia Sister    Depression Sister    Diabetes Sister    Hyperlipidemia Sister    HIV/AIDS Sister    Diabetes Brother    Asthma  Brother    Hypertension Brother    Cancer Maternal Aunt        lung   Heart disease Maternal Grandmother    Heart attack Maternal Grandmother    Cancer Brother        colon   HIV/AIDS Brother    Colon cancer Neg Hx     ALLERGIES:  is allergic to gazyva [obinutuzumab], aspirin, no healthtouch food allergies, aspirin, and baclofen.  MEDICATIONS:  Current Outpatient Medications  Medication Sig Dispense Refill   acetaminophen (TYLENOL) 500 MG tablet Take 500-1,000 mg by mouth every 6 (six) hours as needed for moderate pain.  albuterol (PROVENTIL) (2.5 MG/3ML) 0.083% nebulizer solution Take by nebulization every 6 (six) hours as needed for wheezing or shortness of breath. 90 mL 1  ° albuterol (VENTOLIN HFA) 108 (90 Base) MCG/ACT inhaler Inhale 2 puffs into the lungs every 6 (six) hours as needed for wheezing or shortness of breath. 8.5 each 11  ° Alcohol Swabs (ALCOHOL PADS) 70 % PADS     ° amLODipine (NORVASC) 10 MG tablet Take 1 tablet (10 mg total) by mouth daily. 90 tablet 3  ° atorvastatin (LIPITOR) 80 MG tablet Take 1 tablet (80 mg total) by mouth daily. 90 tablet 3  ° b complex vitamins capsule Take 1 capsule by mouth daily. (Patient not taking: Reported on 11/05/2021) 30 capsule 5  ° Blood Glucose Calibration (OT ULTRA/FASTTK CNTRL SOLN) SOLN See admin instructions.    ° Blood Glucose Monitoring Suppl (ONETOUCH VERIO) w/Device KIT 1 Device by Does not apply route daily. 1 kit 0  ° BREO ELLIPTA 100-25 MCG/INH AEPB Inhale 1 puff into the lungs daily.    ° clopidogrel (PLAVIX) 75 MG tablet Take 1 tablet by mouth every day 30 tablet 11  ° cyclobenzaprine (FLEXERIL) 10 MG tablet Take 10 mg by mouth 3 (three) times daily.    ° diclofenac Sodium (VOLTAREN) 1 % GEL Apply 2 grams topically four times a day 100 g 11  ° Dulaglutide (TRULICITY) 0.75 MG/0.5ML SOPN Inject 0.75 mg into the skin once a week. (Patient not taking: Reported on 11/05/2021)    ° DULoxetine (CYMBALTA) 30 MG capsule Take 1 capsule  by mouth every day 30 capsule 11  ° famotidine (PEPCID) 20 MG tablet Take 20 mg by mouth 2 (two) times daily.    ° furosemide (LASIX) 20 MG tablet Take 1 tablet (20 mg total) by mouth daily. 90 tablet 3  ° gabapentin (NEURONTIN) 300 MG capsule take 2 capsules by mouth twice daily 120 capsule 11  ° glucose blood (ONETOUCH VERIO) test strip Use to check blood sugar once a day 100 each 12  ° hydrALAZINE (APRESOLINE) 25 MG tablet Take 1 tablet (25 mg total) by mouth 2 (two) times daily. 180 tablet 3  ° iron polysaccharides (NIFEREX) 150 MG capsule Take 1 capsule (150 mg total) by mouth daily. (Patient not taking: Reported on 11/05/2021) 30 capsule 5  ° JARDIANCE 25 MG TABS tablet Take 1 tablet by mouth every day 30 tablet 11  ° Lancet Devices (EASY MINI EJECT LANCING DEVICE) MISC daily.    ° Lancets (ONETOUCH ULTRASOFT) lancets Use to check blood sugar once a day 100 each 12  ° lidocaine (XYLOCAINE) 5 % ointment Apply 1 application topically as needed. Apply to R>L feet when burning up to 4x/day as needed 50 g 3  ° metFORMIN (GLUCOPHAGE) 500 MG tablet Take 1 tablet by mouth every day 30 tablet 11  ° montelukast (SINGULAIR) 10 MG tablet Take 1 tablet (10 mg total) by mouth at bedtime. 30 tablet 11  ° nitroGLYCERIN (NITROSTAT) 0.4 MG SL tablet Dissolve 1 tablet under tongue every 5 minutes, up to 3 doses for chest pain 25 tablet 11  ° traMADol (ULTRAM) 50 MG tablet Take 1 tablet (50 mg total) by mouth 2 (two) times daily as needed for severe pain. 20 tablet 0  ° venetoclax (VENCLEXTA) 100 MG tablet Take 2 tablets (200 mg total) by mouth daily. Tablets should be swallowed whole with a meal and a full glass of water. 60 tablet 2  ° °No current facility-administered medications for   this visit.    REVIEW OF SYSTEMS:   .10 Point review of Systems was done is negative except as noted above. PHYSICAL EXAMINATION: ECOG FS:2 - Symptomatic, <50% confined to bed  Vitals:   01/04/22 1535  BP: 120/78  Pulse: 73  Resp: 18   Temp: 98.6 F (37 C)  SpO2: 100%    Wt Readings from Last 3 Encounters:  01/04/22 143 lb 8 oz (65.1 kg)  12/31/21 144 lb 2 oz (65.4 kg)  11/13/21 143 lb 1.6 oz (64.9 kg)   Body mass index is 26.25 kg/m.   Marland Kitchen GENERAL:alert, in no acute distress and comfortable SKIN: no acute rashes, no significant lesions EYES: conjunctiva are pink and non-injected, sclera anicteric OROPHARYNX: MMM, no exudates, no oropharyngeal erythema or ulceration NECK: supple, no JVD LYMPH:  no palpable lymphadenopathy in the cervical, axillary or inguinal regions LUNGS: clear to auscultation b/l with normal respiratory effort HEART: regular rate & rhythm ABDOMEN:  normoactive bowel sounds , non tender, not distended. Extremity: no pedal edema PSYCH: alert & oriented x 3 with fluent speech NEURO: no focal motor/sensory deficits   LABORATORY DATA:  I have reviewed the data as listed   CBC Latest Ref Rng & Units 01/04/2022 11/13/2021 10/23/2021  WBC 4.0 - 10.5 K/uL 6.4 6.4 4.9  Hemoglobin 12.0 - 15.0 g/dL 12.6 13.0 12.6  Hematocrit 36.0 - 46.0 % 38.7 41.5 40.0  Platelets 150 - 400 K/uL 180 188 191    CMP Latest Ref Rng & Units 01/04/2022 11/13/2021 10/23/2021  Glucose 70 - 99 mg/dL 185(H) 195(H) 186(H)  BUN 8 - 23 mg/dL _0 Creatinine 0.44 - 1.00 mg/dL 1.44(H) 1.64(H) 1.55(H)  Sodium 135 - 145 mmol/L 142 139 143  Potassium 3.5 - 5.1 mmol/L 3.6 3.7 3.7  Chloride 98 - 111 mmol/L 107 104 108  CO2 22 - 32 mmol/L _1 Calcium 8.9 - 10.3 mg/dL 9.5 9.1 8.9  Total Protein 6.5 - 8.1 g/dL 6.7 6.9 6.3(L)  Total Bilirubin 0.3 - 1.2 mg/dL 0.4 0.3 0.4  Alkaline Phos 38 - 126 U/L 143(H) 182(H) 138(H)  AST 15 - 41 U/L 13(L) 23 16  ALT 0 - 44 U/L 10 34 11    Lab Results  Component Value Date   LDH 130 01/04/2022         . Lab Results  Component Value Date   LDH 130 01/04/2022    RADIOGRAPHIC STUDIES: I have personally reviewed the radiological images as listed and agreed with the  findings in the report. DG Hand Complete Right  Result Date: 01/05/2022 CLINICAL DATA:  Right index finger pain and swelling.  Fall. EXAM: RIGHT HAND - COMPLETE 3+ VIEW COMPARISON:  None. FINDINGS: Mild degenerative joint space narrowing of the thumb carpometacarpal joint, thumb interphalangeal joint, and DIP joints diffusely. No acute fracture is seen. There is a curvilinear lucency within the distal volar aspect of the distal phalanx of the index finger seen on lateral view only, favored to represent normal nutrient foramen. IMPRESSION: Mild degenerative changes as above.  No acute fracture is seen. Electronically Signed   By: Yvonne Kendall   On: 01/05/2022 18:05    Surgical Pathology 12/13/2017   ASSESSMENT & PLAN:   ISELA STANTZ is a wonderful 76 y.o. female with    1. Rai 1 Chronic Lymphocytic Leukemia  - Trisomy 12 mutation. No associated thrombocytopenia.  Mild anemia - likely from acute blood loss from significant epistaxis.  Now resolved  No constitutional symptoms. Minimal LNaednopathy No splenomegaly  #2 .Lung nodules on CT in 12/2017. Rpt CT chest 08/09/2018 - stable  #3 Patient Active Problem List   Diagnosis Date Noted   Right hand pain 01/01/2022   History of home oxygen therapy 11/06/2021   Chronic diastolic CHF (congestive heart failure) ([place]) 10/21/2021   Pleuritic chest pain 08/03/2021   Sepsis ([place]) 07/23/2021   Community acquired pneumonia of right lung    Fall 05/04/2021   Counseling regarding advance care planning and goals of care 02/19/2021   Iron deficiency anemia 02/12/2021   Peripheral arterial disease ([place]) 10/03/2020   (HFpEF) heart failure with preserved ejection fraction ([place]) 06/11/2020   Venous insufficiency (chronic) (peripheral) 06/22/2019   Pulmonary nodules 01/04/2018   Mass of left lower leg 01/04/2018   Chronic kidney disease (CKD), stage III (moderate) ([place]) 06/04/2016   Chronic back pain 10/02/2015   DM (diabetes mellitus), type 2  with peripheral vascular complications ([place]) 16/09/9603   Chronic lymphocytic leukemia ([place]) 04/11/2009   PERIPHERAL VASCULAR DISEASE 10/13/2007   HYPERCHOLESTEROLEMIA 02/23/2007   ANXIETY 02/23/2007   TOBACCO DEPENDENCE 02/23/2007   Essential hypertension 02/23/2007   COPD (chronic obstructive pulmonary disease) ([place]) 02/23/2007   GASTROESOPHAGEAL REFLUX, NO ESOPHAGITIS 02/23/2007    PLAN: -Labs done today 01/04/2022 reviewed in detail with the patient. CBC shows normal blood counts, CMP stable with chronic kidney disease and LDH within normal limits. -Patient has no lab or clinical evidence of CLL progression at this time. -Reports no new toxicities from the patient's venetoclax at 200 mg p.o. daily. -We will continue venetoclax at the same dose of 200 mg p.o. daily at this time.  Patient will be completing 1 year of treatment with venetoclax in about May 2023. -Return to clinic with Dr. Irene Limbo with labs in 3 months -Continue follow-up with primary care physician for management of other medical co-morbidities  FOLLOW UP: RTC with Dr Irene Limbo with labs in 3 months  All of the patient's questions were answered with apparent satisfaction. The patient knows to call the clinic with any problems, questions or concerns.    Sullivan Lone MD [place] AAHIVMS [facility] Hematology/Oncology Physician [facility]

## 2022-01-11 ENCOUNTER — Telehealth: Payer: Self-pay | Admitting: *Deleted

## 2022-01-11 DIAGNOSIS — J449 Chronic obstructive pulmonary disease, unspecified: Secondary | ICD-10-CM

## 2022-01-11 NOTE — Telephone Encounter (Signed)
Opened in error.[name] [name], CMA  

## 2022-01-11 NOTE — Telephone Encounter (Signed)
Received fax from [email or web address] requesting refill on Ellipta, 119mcg-25mcg.  Tried to reorder and it would no go through, please send refill if appropriate.[name] [name], CMA

## 2022-01-12 ENCOUNTER — Ambulatory Visit: Payer: Medicare Other

## 2022-01-12 MED ORDER — FLUTICASONE FUROATE-VILANTEROL 100-25 MCG/ACT IN AEPB: 1.0000 | INHALATION_SPRAY | Freq: Every day | RESPIRATORY_TRACT | 1 refills | Status: DC

## 2022-01-12 NOTE — Addendum Note (Signed)
Addended by: Alcus Dad on: 01/12/2022 06:57 AM   Modules accepted: Orders

## 2022-01-12 NOTE — Telephone Encounter (Signed)
Refill sent for Breo-Ellipta 100-25

## 2022-01-13 ENCOUNTER — Other Ambulatory Visit: Payer: Self-pay | Admitting: *Deleted

## 2022-01-13 MED ORDER — MONTELUKAST SODIUM 10 MG PO TABS: 10.0000 mg | ORAL_TABLET | Freq: Every day | ORAL | 11 refills | Status: DC

## 2022-01-13 MED ORDER — FAMOTIDINE 20 MG PO TABS: 20.0000 mg | ORAL_TABLET | Freq: Two times a day (BID) | ORAL | 3 refills | Status: DC

## 2022-01-13 NOTE — Chronic Care Management (AMB) (Signed)
Chronic Care Management   CCM RN Visit Note  01/13/2022 Name: [name] [name] MRN: [id number] DOB: 05-02-1946  Subjective: [name] [name] is a 76 y.o. year old female who is a primary care patient of Alcus Dad, MD. The care management team was consulted for assistance with disease management and care coordination needs.    Engaged with patient by telephone for initial visit in response to provider referral for case management and/or care coordination services.   Consent to Services:  The patient was given information about Chronic Care Management services, agreed to services, and gave verbal consent prior to initiation of services.  Please see initial visit note for detailed documentation.   Patient agreed to services and verbal consent obtained.    Summary: The patient currently has oxygen that she financially cannot continue to pay for and alsso she would like to see about getting smaller tanks which would be easier for her to [facility]. See Care Plan below for interventions and patient self-care actives.  Recommendation: The patient may benefit from the financial assistance form to herp with cost and a fit test with [employer] to see if she would qualify for a smaller tank. I planto discuss this with her at our next visit.  Follow up Plan: Patient would like continued follow-up.  CCM RNCM will outreach the patient within the next 2-3 days.  Patient will call office if needed prior to next encounter   Assessment: Review of patient past medical history, allergies, medications, health status, including review of consultants reports, laboratory and other test data, was performed as part of comprehensive evaluation and provision of chronic care management services.   SDOH (Social Determinants of Health) assessments and interventions performed:  SDOH Interventions    Flowsheet Row Most Recent Value  SDOH Interventions   Financial Strain Interventions Other (Comment)  [CMRN going to send  the patient Financial form from [employer] to the patient to fill out to help with payments]        CCM Care Plan   Conditions to be addressed/monitored:COPD  Care Plan : RN Case Manager  Updates made by Lazaro Arms, RN since 01/13/2022 12:00 AM     Problem: General Plan of Care in a patient with COPD      Long-Range Goal: the patient will be maintain in oxygen her oxygen level when needed by using oxygen and prescibed medications.   Start Date: 01/12/2022  Expected End Date: 02/23/2022  Note:   Current Barriers:  Care Coordination needs related to Financial constraints related to Oxygen tanks for COPD   RNCM Clinical Goal(s):  Patient will work with Estate manager/land agent to address needs related to Financial constraints related to [place] as evidenced by patient and/or community resource support through collaboration with Consulting civil engineer, provider, and care team.   Interventions: 1:1 collaboration with primary care provider regarding development and update of comprehensive plan of care as evidenced by provider attestation and co-signature Inter-disciplinary care team collaboration (see longitudinal plan of care) Evaluation of current treatment plan related to  self management and patient's adherence to plan as established by provider    Oxygen  (Status:  New goal.)  Short Term Goal Evaluation of current treatment plan related to COPD, Financial constraints related to COPD  self-management and patient's adherence to plan as established by provider. Discussed plans with patient for ongoing care management follow up and provided patient with direct contact information for care management team Evaluation of current treatment plan related to Oxygen  Provider and patient's adherence to plan as established by provider Reviewed medications with patient and discussed Unable to continue do to patient needed to go to an appointment Assessed social determinant of health barriers I spoke  with [name] [name] yesterday for her Initial visit, and she stated that she has oxygen from [employer] that she received while in the hospital. She also feels the bottles are too big for her when going out for appointments or other events. Also, she no longer wants to use the company because she cannot continue to pay the cost. The patient could not continue that call to go more in-depth about the oxygen she needed to go to an appointment and asked that we follow up later. Will call the patient back to finish inquiry   Patient Goals/Self Care Activities: -Patient/Caregiver will self-administer medications as prescribed as evidenced by self-report/primary caregiver report  -Patient/Caregiver will attend all scheduled provider appointments as evidenced by clinician review of documented attendance to scheduled appointments and patient/caregiver report -Patient/Caregiver will call pharmacy for medication refills as evidenced by patient report and review of pharmacy fill history as appropriate -Patient/Caregiver will call provider office for new concerns or questions as evidenced by review of documented incoming telephone call notes and patient report -Patient/Caregiver verbalizes understanding of plan -Patient/Caregiver will focus on medication adherence by taking medications as prescribed -Avoid smoke and air pollution -Visit your doctor on a regular basis -Self assess COPD action plan zone and make appointment with provider if you have been in the yellow zone for 48 hours without improvement. -The patient will discuss financial form from [place], BSN, [place] Management Coordinator [place]  Phone: [phone]

## 2022-01-13 NOTE — Patient Instructions (Addendum)
[name] [name]  it was nice speaking with you. Please call me directly [phone] if you have questions about the goals we discussed.  Patient Care Plan: RN Case Manager     Problem Identified: General Plan of Care in a patient with COPD      Long-Range Goal: the patient will be maintain in oxygen her oxygen level when needed by using oxygen and prescibed medications.   Start Date: 01/12/2022  Expected End Date: 02/23/2022  Note:   Current Barriers:  Care Coordination needs related to Financial constraints related to Oxygen tanks for COPD   RNCM Clinical Goal(s):  Patient will work with Estate manager/land agent to address needs related to Financial constraints related to [place] as evidenced by patient and/or community resource support through collaboration with Consulting civil engineer, provider, and care team.   Interventions: 1:1 collaboration with primary care provider regarding development and update of comprehensive plan of care as evidenced by provider attestation and co-signature Inter-disciplinary care team collaboration (see longitudinal plan of care) Evaluation of current treatment plan related to  self management and patient's adherence to plan as established by provider    Oxygen  (Status:  New goal.)  Short Term Goal Evaluation of current treatment plan related to COPD, Financial constraints related to COPD  self-management and patient's adherence to plan as established by provider. Discussed plans with patient for ongoing care management follow up and provided patient with direct contact information for care management team Evaluation of current treatment plan related to Oxygen Provider and patient's adherence to plan as established by provider Reviewed medications with patient and discussed Unable to continue do to patient needed to go to an appointment Assessed social determinant of health barriers I spoke with [name] [name] yesterday for her Initial visit, and she stated  that she has oxygen from [employer] that she received while in the hospital. She also feels the bottles are too big for her when going out for appointments or other events. Also, she no longer wants to use the company because she cannot continue to pay the cost. The patient could not continue that call to go more in-depth about the oxygen she needed to go to an appointment and asked that we follow up later. Will call the patient back to finish inquiry   Patient Goals/Self Care Activities: -Patient/Caregiver will self-administer medications as prescribed as evidenced by self-report/primary caregiver report  -Patient/Caregiver will attend all scheduled provider appointments as evidenced by clinician review of documented attendance to scheduled appointments and patient/caregiver report -Patient/Caregiver will call pharmacy for medication refills as evidenced by patient report and review of pharmacy fill history as appropriate -Patient/Caregiver will call provider office for new concerns or questions as evidenced by review of documented incoming telephone call notes and patient report -Patient/Caregiver verbalizes understanding of plan -Patient/Caregiver will focus on medication adherence by taking medications as prescribed -Avoid smoke and air pollution -Visit your doctor on a regular basis -Self assess COPD action plan zone and make appointment with provider if you have been in the yellow zone for 48 hours without improvement. -The patient will discuss financial form from [employer]        Ms. Elie received Care Management services today:  Care Management services include personalized support from designated clinical staff supervised by her physician, including individualized plan of care and coordination with other care providers 24/7 contact [phone] for assistance for urgent and routine care needs. Care Management are voluntary services and be declined at any time by  calling the  office.  The  is able to pull AVS in Mychart.    Follow Up Plan: Patient would like continued follow-up.  CCM RNCM will outreach the patient within the next 2-3 days  Patient will call office if needed prior to next encounter   Lazaro Arms, RN   [phone]

## 2022-01-14 ENCOUNTER — Ambulatory Visit: Payer: Medicare Other

## 2022-01-14 NOTE — Patient Instructions (Signed)
Visit Information  [name] [name]  it was nice speaking with you. Please call me directly [phone] if you have questions about the goals we discussed.    Patient Goals/Self Care Activities: -Patient/Caregiver will self-administer medications as prescribed as evidenced by self-report/primary caregiver report  -Patient/Caregiver will attend all scheduled provider appointments as evidenced by clinician review of documented attendance to scheduled appointments and patient/caregiver report -Patient/Caregiver will call pharmacy for medication refills as evidenced by patient report and review of pharmacy fill history as appropriate -Patient/Caregiver will call provider office for new concerns or questions as evidenced by review of documented incoming telephone call notes and patient report -Patient/Caregiver verbalizes understanding of plan -Patient/Caregiver will focus on medication adherence by taking medications as prescribed -Avoid smoke and air pollution -Visit your doctor on a regular basis -Self assess COPD action plan zone and make appointment with provider if you have been in the yellow zone for 48 hours without improvement. -The patient will Fill out forms from [employer] and send in information         The patient verbalized understanding of instructions, educational materials, and care plan provided today and agreed to receive a mailed copy of patient instructions, educational materials, and care plan.   Follow up Plan: Patient would like continued follow-up.  CCM RNCM will outreach the patient within the next 2 weeks  Patient will call office if needed prior to next encounter  Lazaro Arms, RN  [phone]

## 2022-01-14 NOTE — Chronic Care Management (AMB) (Signed)
Chronic Care Management   CCM RN Visit Note  01/14/2022 Name: [name] [name] MRN: [id number] DOB: 07-02-1946  Subjective: [name] [name] is a 76 y.o. year old female who is a primary care patient of Alcus Dad, MD. The care management team was consulted for assistance with disease management and care coordination needs.    Engaged with patient by telephone for initial visit in response to provider referral for case management and/or care coordination services.   Consent to Services:  The patient was given information about Chronic Care Management services, agreed to services, and gave verbal consent prior to initiation of services.  Please see initial visit note for detailed documentation.   Patient agreed to services and verbal consent obtained.    Summary: Today I talked with [name] [name] and explained the [employer] financial application I sent her. I have sent correspondence to [employer] asking questions about her oxygen that we discussed.. See Care Plan below for interventions and patient self-care actives.  Recommendation: The patient may benefit from the [employer] financial form to help with paying for oxygen and possibly taking the fit test if she does not have the small tanks.  Follow up Plan: Patient would like continued follow-up.  CCM RNCM will outreach the patient within the next 2 weeks.  Patient will call office if needed prior to next encounter   Assessment: Review of patient past medical history, allergies, medications, health status, including review of consultants reports, laboratory and other test data, was performed as part of comprehensive evaluation and provision of chronic care management services.   SDOH (Social Determinants of Health) assessments and interventions performed:    CCM Care Plan     Conditions to be addressed/monitored:COPD  Care Plan : RN Case Manager  Updates made by Lazaro Arms, RN since 01/14/2022 12:00 AM     Problem: General Plan of Care  in a patient with COPD      Long-Range Goal: the patient will be maintain in oxygen her oxygen level when needed by using oxygen and prescibed medications.   Start Date: 01/12/2022  Expected End Date: 02/23/2022  Note:   Current Barriers:  Care Coordination needs related to Financial constraints related to Oxygen tanks for COPD   RNCM Clinical Goal(s):  Patient will work with Estate manager/land agent to address needs related to Financial constraints related to [place] as evidenced by patient and/or community resource support through collaboration with Consulting civil engineer, provider, and care team.   Interventions: 1:1 collaboration with primary care provider regarding development and update of comprehensive plan of care as evidenced by provider attestation and co-signature Inter-disciplinary care team collaboration (see longitudinal plan of care) Evaluation of current treatment plan related to  self management and patient's adherence to plan as established by provider    Oxygen  (Status:  New goal.)  Short Term Goal Evaluation of current treatment plan related to COPD, Financial constraints related to COPD  self-management and patient's adherence to plan as established by provider. Discussed plans with patient for ongoing care management follow up and provided patient with direct contact information for care management team Evaluation of current treatment plan related to Oxygen Provider and patient's adherence to plan as established by provider Reviewed medications with patient and discussed Unable to continue do to patient needed to go to an appointment Assessed social determinant of health barriers 01/14/22:  Today I talked with the patient about the [employer] financial application I sent her in the mail. We also discussed the  smaller tanks that they have and the shoulder carriers. I am curious if she has the smaller tanks. I have sent correspondence to [employer] asking questions about her oxygen  that we discussed.  Patient Goals/Self Care Activities: -Patient/Caregiver will self-administer medications as prescribed as evidenced by self-report/primary caregiver report  -Patient/Caregiver will attend all scheduled provider appointments as evidenced by clinician review of documented attendance to scheduled appointments and patient/caregiver report -Patient/Caregiver will call pharmacy for medication refills as evidenced by patient report and review of pharmacy fill history as appropriate -Patient/Caregiver will call provider office for new concerns or questions as evidenced by review of documented incoming telephone call notes and patient report -Patient/Caregiver verbalizes understanding of plan -Patient/Caregiver will focus on medication adherence by taking medications as prescribed -Avoid smoke and air pollution -Visit your doctor on a regular basis -Self assess COPD action plan zone and make appointment with provider if you have been in the yellow zone for 48 hours without improvement. -The patient will Fill out forms from [employer] and send in information     [place], BSN, [place] Management Coordinator [place]  Phone: [phone]

## 2022-01-15 ENCOUNTER — Other Ambulatory Visit: Payer: Self-pay

## 2022-01-15 DIAGNOSIS — C9110 Chronic lymphocytic leukemia of B-cell type not having achieved remission: Secondary | ICD-10-CM

## 2022-01-18 ENCOUNTER — Ambulatory Visit: Payer: Medicare Other | Admitting: Hematology

## 2022-01-18 ENCOUNTER — Other Ambulatory Visit: Payer: Medicare Other

## 2022-01-22 ENCOUNTER — Other Ambulatory Visit: Payer: Self-pay

## 2022-01-22 MED ORDER — ONETOUCH VERIO W/DEVICE KIT: 1.0000 | PACK | Freq: Every day | 0 refills | Status: DC

## 2022-01-22 MED ORDER — ONETOUCH ULTRASOFT LANCETS MISC: 12 refills | Status: DC

## 2022-01-22 MED ORDER — ONETOUCH VERIO VI STRP: ORAL_STRIP | 12 refills | Status: DC

## 2022-01-22 NOTE — Telephone Encounter (Signed)
Patient calls nurse line regarding need for new glucometer. Current meter is broken.   Resent rx for new glucometer and supplies.   Talbot Grumbling, RN

## 2022-01-25 ENCOUNTER — Other Ambulatory Visit (HOSPITAL_COMMUNITY): Payer: Self-pay

## 2022-01-26 ENCOUNTER — Other Ambulatory Visit (HOSPITAL_COMMUNITY): Payer: Self-pay

## 2022-01-28 ENCOUNTER — Telehealth: Payer: Self-pay

## 2022-01-28 ENCOUNTER — Telehealth: Payer: Medicare Other

## 2022-01-28 NOTE — Telephone Encounter (Signed)
° °  RN Case Manager Care Management   Phone Outreach    01/28/2022 Name: [name] [name] MRN: [id number] DOB: 06-Apr-1946  [name] [name] is a 76 y.o. year old female who is a primary care patient of Alcus Dad, MD .   Telephone outreach was unsuccessful A HIPPA compliant phone message was left for the patient providing contact information and requesting a return call.   Follow Up Plan: Will route chart to Care Guide to see if patient would like to reschedule phone appointment.    Review of patient status, including review of consultants reports, relevant laboratory and other test results, and collaboration with appropriate care team members and the patient's provider was performed as part of comprehensive patient evaluation and provision of care management services.    Lazaro Arms RN, BSN, [facility] Care Management Coordinator [place] Phone: [phone] Fax: [phone]

## 2022-02-01 ENCOUNTER — Other Ambulatory Visit (HOSPITAL_COMMUNITY): Payer: Self-pay

## 2022-02-02 ENCOUNTER — Telehealth: Payer: Self-pay | Admitting: *Deleted

## 2022-02-02 NOTE — Chronic Care Management (AMB) (Signed)
°  Care Management   Note  02/02/2022 Name: [name] [name] MRN: [id number] DOB: 14-Mar-1946  [name] [name] is a 76 y.o. year old female who is a primary care patient of Alcus Dad, MD and is actively engaged with the care management team. I reached out to Octavia Bruckner by phone today to assist with re-scheduling a follow up visit with the RN Case Manager  Follow up plan: Unsuccessful telephone outreach attempt made. A HIPAA compliant phone message was left for the patient providing contact information and requesting a return call.  The care management team will reach out to the patient again over the next 7 days.  If patient returns call to provider office, please advise to call [place] at [phone].  [place] Management  Direct Dial: [phone]

## 2022-02-02 NOTE — Chronic Care Management (AMB) (Signed)
°  Care Management   Note  02/02/2022 Name: [name] [name] MRN: [id number] DOB: [birth year]  [name] [name] is a 76 y.o. year old female who is a primary care patient of Alcus Dad, MD and is actively engaged with the care management team. I reached out to Octavia Bruckner by phone today to assist with re-scheduling a follow up visit with the RN Case Manager  Follow up plan: Telephone appointment with care management team member scheduled for:02/08/22  [place], [place] Management  Direct Dial: [phone]

## 2022-02-08 ENCOUNTER — Ambulatory Visit: Payer: Medicare Other

## 2022-02-08 NOTE — Patient Instructions (Signed)
Visit Information  Ms. Charters  it was nice speaking with you. Please call me directly [phone] if you have questions about the goals we discussed.   Patient Goals/Self Care Activities: -Patient/Caregiver will self-administer medications as prescribed as evidenced by self-report/primary caregiver report  -Patient/Caregiver will attend all scheduled provider appointments as evidenced by clinician review of documented attendance to scheduled appointments and patient/caregiver report -Patient/Caregiver will call pharmacy for medication refills as evidenced by patient report and review of pharmacy fill history as appropriate -Patient/Caregiver will call provider office for new concerns or questions as evidenced by review of documented incoming telephone call notes and patient report -Patient/Caregiver verbalizes understanding of plan -Patient/Caregiver will focus on medication adherence by taking medications as prescribed -Avoid smoke and air pollution -Self assess COPD action plan zone and make appointment with provider if you have been in the yellow zone for 48 hours without improvement. -The patient will Fill out forms from [employer] and send in information          The patient verbalized understanding of instructions, educational materials, and care plan provided today and agreed to receive a mailed copy of patient instructions, educational materials, and care plan.   Follow up Plan: Patient would like continued follow-up.  CCM RNCM will outreach the patient within the next 4 weeks.  Patient will call office if needed prior to next encounter  Lazaro Arms, RN  [phone]

## 2022-02-08 NOTE — Chronic Care Management (AMB) (Signed)
Chronic Care Management   CCM RN Visit Note  02/08/2022 Name: [name] [name] MRN: [id number] DOB: 1946/07/14  Subjective: [name] [name] is a 76 y.o. year old female who is a primary care patient of Alcus Dad, MD. The care management team was consulted for assistance with disease management and care coordination needs.    Engaged with patient by telephone for follow up visit in response to provider referral for case management and/or care coordination services.   Consent to Services:  The patient was given information about Chronic Care Management services, agreed to services, and gave verbal consent prior to initiation of services.  Please see initial visit note for detailed documentation.   Patient agreed to services and verbal consent obtained.    Summary:  The patient could not find the Financial forms I sent to her they will be re mailed . See Care Plan below for interventions and patient self-care actives.  Recommendation: The patient may benefit from filling out the [employer] financial Form as soon as she receives it and working with [employer] regarding the FIT test.  The patient agrees.  Follow up Plan: Patient would like continued follow-up.  CCM RNCM will outreach the patient within the next 4 weeks.  Patient will call office if needed prior to next encounter   Assessment: Review of patient past medical history, allergies, medications, health status, including review of consultants reports, laboratory and other test data, was performed as part of comprehensive evaluation and provision of chronic care management services.   SDOH (Social Determinants of Health) assessments and interventions performed:  No  CCM Care Plan  Conditions to be addressed/monitored:COPD  Care Plan : RN Case Manager  Updates made by Lazaro Arms, RN since 02/08/2022 12:00 AM     Problem: General Plan of Care in a patient with COPD      Long-Range Goal: the patient will be maintain in oxygen her  oxygen level when needed by using oxygen and prescibed medications.   Start Date: 01/12/2022  Expected End Date: 03/26/2022  Note:   Current Barriers:  Care Coordination needs related to Financial constraints related to Oxygen tanks for COPD   RNCM Clinical Goal(s):  Patient will work with Estate manager/land agent to address needs related to Financial constraints related to [place] as evidenced by patient and/or community resource support through collaboration with Consulting civil engineer, provider, and care team.   Interventions: 1:1 collaboration with primary care provider regarding development and update of comprehensive plan of care as evidenced by provider attestation and co-signature Inter-disciplinary care team collaboration (see longitudinal plan of care) Evaluation of current treatment plan related to  self management and patient's adherence to plan as established by provider    Oxygen  (Status:  New goal.)  Short Term Goal Evaluation of current treatment plan related to COPD, Financial constraints related to COPD  self-management and patient's adherence to plan as established by provider. Discussed plans with patient for ongoing care management follow up and provided patient with direct contact information for care management team Evaluation of current treatment plan related to Oxygen Provider and patient's adherence to plan as established by provider Reviewed medications with patient and discussed Unable to continue do to patient needed to go to an appointment 02/08/22: I talked with the patient about the [employer] financial application I sent in the mail, and she could not find it, so I will have it mailed again.   She asked me to have the provider send an order  for her to have a Fit test to qualify for the smaller tanks and shoulder carriers.  Note has been sent to provider.   Patient Goals/Self Care Activities: -Patient/Caregiver will self-administer medications as prescribed as  evidenced by self-report/primary caregiver report  -Patient/Caregiver will attend all scheduled provider appointments as evidenced by clinician review of documented attendance to scheduled appointments and patient/caregiver report -Patient/Caregiver will call pharmacy for medication refills as evidenced by patient report and review of pharmacy fill history as appropriate -Patient/Caregiver will call provider office for new concerns or questions as evidenced by review of documented incoming telephone call notes and patient report -Patient/Caregiver verbalizes understanding of plan -Patient/Caregiver will focus on medication adherence by taking medications as prescribed -Avoid smoke and air pollution -Self assess COPD action plan zone and make appointment with provider if you have been in the yellow zone for 48 hours without improvement. -The patient will Fill out forms from [employer] and send in information     [place], BSN, [facility] Care Management Coordinator [place]  Phone: [phone]

## 2022-02-09 ENCOUNTER — Other Ambulatory Visit: Payer: Self-pay | Admitting: Family Medicine

## 2022-02-09 ENCOUNTER — Telehealth: Payer: Self-pay

## 2022-02-09 DIAGNOSIS — J449 Chronic obstructive pulmonary disease, unspecified: Secondary | ICD-10-CM

## 2022-02-09 NOTE — Telephone Encounter (Signed)
Received DME order request for oxygen-Best Fit Evaluation. Community message sent to [employer]. Will await response.   Talbot Grumbling, RN

## 2022-02-09 NOTE — Telephone Encounter (Signed)
Message received from [place] confirming receipt.   Talbot Grumbling, RN

## 2022-02-09 NOTE — Addendum Note (Signed)
Addended by: Sharion Settler on: 02/09/2022 11:51 AM   Modules accepted: Orders

## 2022-02-18 ENCOUNTER — Other Ambulatory Visit (HOSPITAL_COMMUNITY): Payer: Self-pay

## 2022-02-19 ENCOUNTER — Other Ambulatory Visit (HOSPITAL_COMMUNITY): Payer: Self-pay

## 2022-02-22 ENCOUNTER — Other Ambulatory Visit (HOSPITAL_COMMUNITY): Payer: Self-pay

## 2022-02-24 ENCOUNTER — Other Ambulatory Visit (HOSPITAL_COMMUNITY): Payer: Self-pay

## 2022-02-25 ENCOUNTER — Other Ambulatory Visit (HOSPITAL_COMMUNITY): Payer: Self-pay

## 2022-02-26 ENCOUNTER — Other Ambulatory Visit: Payer: Self-pay | Admitting: *Deleted

## 2022-02-26 MED ORDER — ALBUTEROL SULFATE (2.5 MG/3ML) 0.083% IN NEBU: 2.5000 mg | INHALATION_SOLUTION | Freq: Four times a day (QID) | RESPIRATORY_TRACT | 1 refills | Status: DC | PRN

## 2022-03-01 ENCOUNTER — Other Ambulatory Visit (HOSPITAL_COMMUNITY): Payer: Self-pay

## 2022-03-04 IMAGING — CR DG FOREARM 2V*L*
2 series · 2 of 2 positions shown · non-contrast
Comparison: Left wrist series today reported separately.

CLINICAL DATA: 74-year-old female status post fall. On blood
thinners.

EXAM:
LEFT FOREARM - 2 VIEW

[forearm ap]
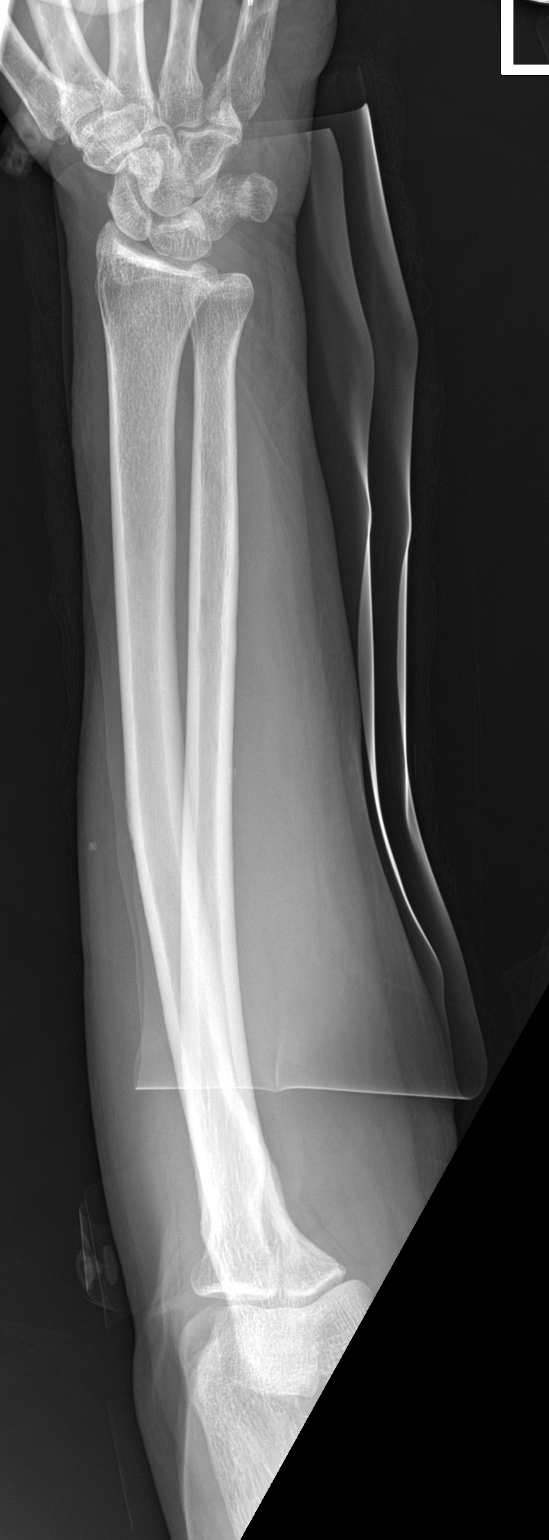

[forearm lat]
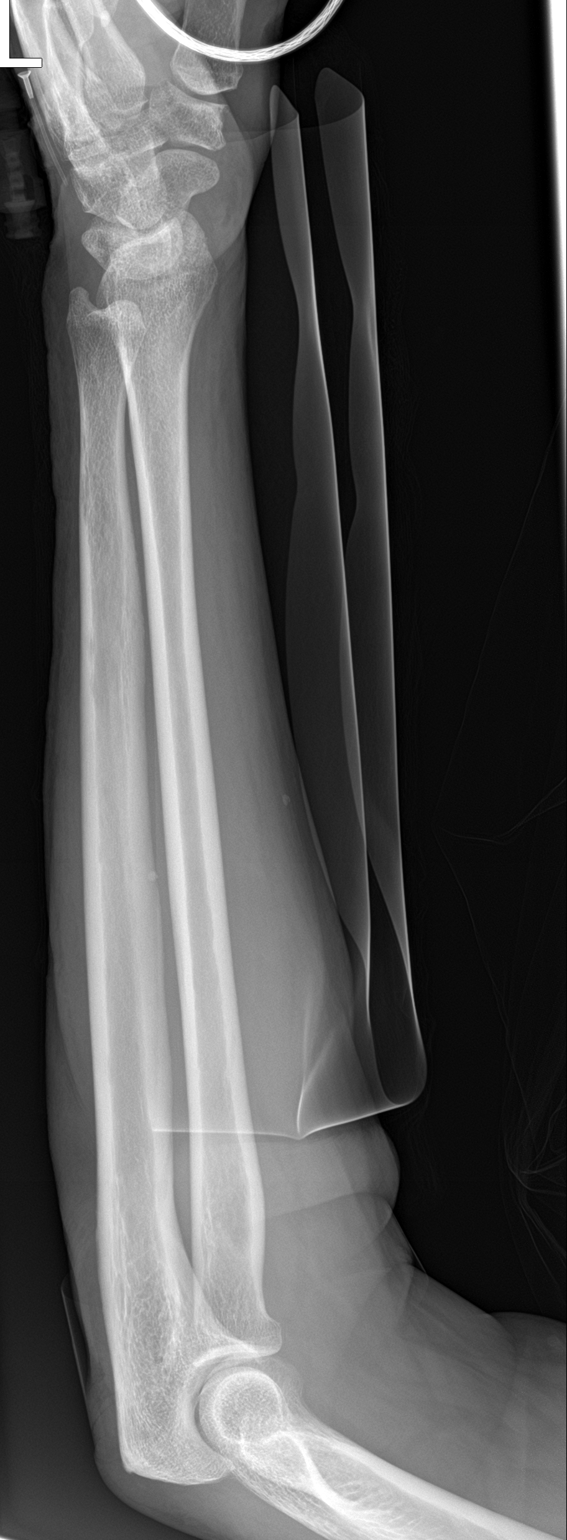

[2 of 2 positions shown; findings below may reference images not displayed]

FINDINGS: Stable alignment at the left wrist. Bone mineralization is within
normal limits. The left radius and ulna appear intact. Alignment at
the left elbow appears maintained. There are 1 or 2 punctate
calcific foci in the forearm soft tissues. But otherwise no discrete
soft tissue injury.
IMPRESSION: No acute fracture or dislocation identified about the left forearm.

## 2022-03-05 ENCOUNTER — Other Ambulatory Visit: Payer: Self-pay

## 2022-03-05 MED ORDER — CYCLOBENZAPRINE HCL 10 MG PO TABS: 10.0000 mg | ORAL_TABLET | Freq: Three times a day (TID) | ORAL | 0 refills | Status: DC

## 2022-03-05 MED ORDER — NITROGLYCERIN 0.4 MG SL SUBL: SUBLINGUAL_TABLET | SUBLINGUAL | 11 refills | Status: DC

## 2022-03-09 ENCOUNTER — Other Ambulatory Visit (HOSPITAL_COMMUNITY): Payer: Self-pay

## 2022-03-11 ENCOUNTER — Ambulatory Visit: Payer: Medicare Other

## 2022-03-11 NOTE — Chronic Care Management (AMB) (Signed)
?Chronic Care Management  ? ?CCM RN Visit Note ? ?03/11/2022 ?Name: [name] [name] MRN: [id number] DOB: 12/18/1946 ? ?Subjective: ?[name] [name] is a 76 y.o. year old female who is a primary care patient of Alcus Dad, MD. The care management team was consulted for assistance with disease management and care coordination needs.   ? ?Engaged with patient by telephone for follow up visit in response to provider referral for case management and/or care coordination services.  ? ?Consent to Services:  ?The patient was given information about Chronic Care Management services, agreed to services, and gave verbal consent prior to initiation of services.  Please see initial visit note for detailed documentation.  ? ?Patient agreed to services and verbal consent obtained.  ? ? ?Summary:  The patient is making progress with her COPD but recently had an episode with her breathing and needed to use her oxygen . See Care Plan below for interventions and patient self-care actives. ? ?Recommendation: The patient may benefit from Take medications as prescribed and avoid anything that would aggravate or irritate her like smoke, cleaning supplies, or pet hair, remember to gather the information needed for the financial papers.  The patient agrees.  ? ?Follow up Plan: Patient would like continued follow-up.  CCM RNCM will outreach the patient within the next 4 weeks.  Patient will call office if needed prior to next encounter ? ? ?Assessment: Review of patient past medical history, allergies, medications, health status, including review of consultants reports, laboratory and other test data, was performed as part of comprehensive evaluation and provision of chronic care management services.  ? ?SDOH (Social Determinants of Health) assessments and interventions performed:  No ? ?CCM Care Plan ? ?Conditions to be addressed/monitored:COPD ? ?Care Plan : RN Case Manager  ?Updates made by Lazaro Arms, RN since 03/11/2022 12:00 AM  ?   ? ?Problem: General Plan of Care in a patient with COPD   ?  ? ?Long-Range Goal: the patient will be maintain her oxygen  level when needed by using oxygen and prescibed medications.   ?Start Date: 01/12/2022  ?Expected End Date: 04/23/2022  ?Note:   ?Current Barriers:  ?Care Coordination needs related to Financial constraints related to Oxygen tanks for COPD  ? ?RNCM Clinical Goal(s):  ?Patient will work with Estate manager/land agent to address needs related to Financial constraints related to Oxygen Tanks as evidenced by patient and/or community resource support through collaboration with Consulting civil engineer, provider, and care team.  ? ?Interventions: ?1:1 collaboration with primary care provider regarding development and update of comprehensive plan of care as evidenced by provider attestation and co-signature ?Inter-disciplinary care team collaboration (see longitudinal plan of care) ?Evaluation of current treatment plan related to  self management and patient's adherence to plan as established by provider ? ? ? ?Oxygen  (Status:  New goal.)  Short Term Goal ?Evaluation of current treatment plan related to COPD, Financial constraints related to COPD  self-management and patient's adherence to plan as established by provider. ?Discussed plans with patient for ongoing care management follow up and provided patient with direct contact information for care management team ?Evaluation of current treatment plan related to Oxygen Provider and patient's adherence to plan as established by provider ?Reviewed medications with patient and discussed -The patient states that she uses her inhalers and nebulizer as directed ?03/11/22: [name] [name] said she had an episode last night with shortness of breath and had to use her oxygen set at 2 liters and slept  with it all night. This morning she is feeling much better. She received the information I had mailed to her but needed to gather the required information for the financial form  for [employer]. She stated she had not had a call from them either for the fit test. We reviewed the form again; she knows what she needs and where to send the information. I told her I would send a message to [employer] asking if they could contact her regarding the test so she can see if she qualifies for a small concentrator. ? ?Patient Goals/Self Care Activities: ?-Patient/Caregiver will self-administer medications as prescribed as evidenced by self-report/primary caregiver report  ?-Patient/Caregiver will attend all scheduled provider appointments as evidenced by clinician review of documented attendance to scheduled appointments and patient/caregiver report ?-Patient/Caregiver will call pharmacy for medication refills as evidenced by patient report and review of pharmacy fill history as appropriate ?-Patient/Caregiver will call provider office for new concerns or questions as evidenced by review of documented incoming telephone call notes and patient report ?-Patient/Caregiver verbalizes understanding of plan ?-Patient/Caregiver will focus on medication adherence by taking medications as prescribed ?-Avoid smoke and air pollution ?-Self assess COPD action plan zone and make appointment with provider if you have been in the yellow zone for 48 hours without improvement. ?-The patient will Fill out forms from [employer] and send in information ?  ?Lazaro Arms RN, BSN, [place] ?Care Management Coordinator ?[place]  ?Phone: [phone]  ?  ? ? ? ? ? ? ? ?

## 2022-03-11 NOTE — Patient Instructions (Signed)
Visit Information ? ?[name] [name]  it was nice speaking with you. Please call me directly [phone] if you have questions about the goals we discussed. ? ?Patient Goals/Self Care Activities: ?-Patient/Caregiver will self-administer medications as prescribed as evidenced by self-report/primary caregiver report  ?-Patient/Caregiver will attend all scheduled provider appointments as evidenced by clinician review of documented attendance to scheduled appointments and patient/caregiver report ?-Patient/Caregiver will call pharmacy for medication refills as evidenced by patient report and review of pharmacy fill history as appropriate ?-Patient/Caregiver will call provider office for new concerns or questions as evidenced by review of documented incoming telephone call notes and patient report ?-Patient/Caregiver verbalizes understanding of plan ?-Patient/Caregiver will focus on medication adherence by taking medications as prescribed ?-Avoid smoke and air pollution ?-Self assess COPD action plan zone and make appointment with provider if you have been in the yellow zone for 48 hours without improvement. ?-The patient will Fill out forms from [employer] and send in information ?  ? ? ?Patient verbalizes understanding of instructions and care plan provided today and agrees to view in [place]. Active MyChart status confirmed with patient.   ? ?Follow up Plan: Patient would like continued follow-up.  CCM RNCM will outreach the patient within the next 4 weeks.  Patient will call office if needed prior to next encounter ? ?Lazaro Arms, RN ? ?[phone]  ?

## 2022-03-15 ENCOUNTER — Other Ambulatory Visit (HOSPITAL_COMMUNITY): Payer: Self-pay

## 2022-03-21 ENCOUNTER — Other Ambulatory Visit: Payer: Self-pay | Admitting: Family Medicine

## 2022-03-21 DIAGNOSIS — J449 Chronic obstructive pulmonary disease, unspecified: Secondary | ICD-10-CM

## 2022-03-30 ENCOUNTER — Other Ambulatory Visit: Payer: Self-pay | Admitting: Family Medicine

## 2022-03-31 ENCOUNTER — Telehealth: Payer: Self-pay | Admitting: Hematology

## 2022-03-31 ENCOUNTER — Inpatient Hospital Stay: Payer: Medicare Other | Admitting: Hematology

## 2022-03-31 ENCOUNTER — Inpatient Hospital Stay: Payer: Medicare Other

## 2022-03-31 NOTE — Telephone Encounter (Signed)
.  Called patient to schedule appointment per 4/4, patient was left vmail with husband # ?

## 2022-03-31 NOTE — Telephone Encounter (Signed)
Patient called to reschedule today's visit due to her car breaking down. Patient is aware of changes. Mailed calendar. ?

## 2022-04-01 ENCOUNTER — Telehealth: Payer: Medicare Other

## 2022-04-05 ENCOUNTER — Other Ambulatory Visit (HOSPITAL_COMMUNITY): Payer: Self-pay

## 2022-04-06 ENCOUNTER — Ambulatory Visit (INDEPENDENT_AMBULATORY_CARE_PROVIDER_SITE_OTHER): Payer: Medicare Other | Admitting: Family Medicine

## 2022-04-06 ENCOUNTER — Encounter: Payer: Self-pay | Admitting: Family Medicine

## 2022-04-06 VITALS — BP 131/64 | HR 68 | Ht 62.0 in | Wt 142.2 lb

## 2022-04-06 DIAGNOSIS — W19XXXS Unspecified fall, sequela: Secondary | ICD-10-CM | POA: Diagnosis not present

## 2022-04-06 DIAGNOSIS — Z79899 Other long term (current) drug therapy: Secondary | ICD-10-CM | POA: Diagnosis not present

## 2022-04-06 NOTE — Progress Notes (Signed)
? ? ?  SUBJECTIVE:  ? ?CHIEF COMPLAINT / HPI:  ? ?Discuss medications- patient mainly here to review her medications. Med rec unable to be completed at prior visits as patient was not familiar with names of meds. She brought her medications with her today. They come in prefilled packets from [facility]. She otherwise manages her meds herself. ? ?Reports her Gabapentin was increased from '300mg'$  daily to BID and she's not sure why. Does not think the BID dosing is helpful and she does struggle with daytime sleepiness. ? ?Fall- she did have one fall recently (didn't put brakes on her walker) although did not sustain any injuries. No falls prior to this. Uses her walker at all times.  ? ?Otherwise has no specific complaints or concerns today. ? ?Long term goal is to stay at home. Has family in the area but would hire help if needed rather than move with family or to assisted living.  ? ?PERTINENT  PMH / PSH: T2DM, HTN, COPD, tobacco use, CLL, CKD, HFpEF ? ?OBJECTIVE:  ? ?BP 131/64   Pulse 68   Ht '5\' 2"'$  (1.575 m)   Wt 142 lb 3.2 oz (64.5 kg)   SpO2 98%   BMI 26.01 kg/m?   ?General: NAD, pleasant, able to participate in exam ?Respiratory: No respiratory distress ?Psych: Normal affect and mood ?Neuro: grossly intact ? ? ?ASSESSMENT/PLAN:  ? ?Polypharmacy ?Majority of medications appropriate at this time. ?-Discussed at length the importance of reducing (& eventually eliminating) Flexeril, which she takes for muscle spasms in her hands and feet? Discussed that there is increased risk of falls with this medication. Patient agreeable although somewhat reluctant to decrease from '10mg'$  TID prn to '5mg'$  TID prn.  ?-Congratulated patient on discontinuing Tramadol ?-Reduce gabapentin to '300mg'$  daily (from BID) ? ? ?Fall ?Fortunately did not sustain any injuries. Previously was doing home health PT and is interested in resuming. ?-Continue using walker at all times ?-Reduce Flexeril as below ?-New referral placed for home health  PT ? ? ?Alcus Dad, MD ?[place]  ?

## 2022-04-06 NOTE — Patient Instructions (Addendum)
It was great to see you! ? ?Things we discussed at today's visit: ? ?STOP taking your morning dose of Gabapentin ?CUT your cyclobenzaprine (also called Flexeril) in half so that you're only taking '5mg'$  ?It's important to reduce the dose of the Flexeril and eventually I'd like to stop it altogether. This medication can be dangerous for patients above age 75, as it increases your risk of falls. ? ?Take care and seek immediate care sooner if you develop any concerns. ? ?Dr. Rock Nephew ?[facility]  ?

## 2022-04-07 ENCOUNTER — Other Ambulatory Visit (HOSPITAL_COMMUNITY): Payer: Self-pay

## 2022-04-07 DIAGNOSIS — Z79899 Other long term (current) drug therapy: Secondary | ICD-10-CM | POA: Insufficient documentation

## 2022-04-07 MED ORDER — CYCLOBENZAPRINE HCL 5 MG PO TABS: 5.0000 mg | ORAL_TABLET | Freq: Three times a day (TID) | ORAL | Status: DC | PRN

## 2022-04-07 NOTE — Assessment & Plan Note (Signed)
Fortunately did not sustain any injuries. Previously was doing home health PT and is interested in resuming. ?-Continue using walker at all times ?-Reduce Flexeril as below ?-New referral placed for home health PT ? ?

## 2022-04-07 NOTE — Assessment & Plan Note (Signed)
Majority of medications appropriate at this time. ?-Discussed at length the importance of reducing (& eventually eliminating) Flexeril, which she takes for muscle spasms in her hands and feet? Discussed that there is increased risk of falls with this medication. Patient agreeable although somewhat reluctant to decrease from '10mg'$  TID prn to '5mg'$  TID prn.  ?-Congratulated patient on discontinuing Tramadol ?-Reduce gabapentin to '300mg'$  daily (from BID) ? ?

## 2022-04-09 ENCOUNTER — Other Ambulatory Visit (HOSPITAL_COMMUNITY): Payer: Self-pay

## 2022-04-09 ENCOUNTER — Other Ambulatory Visit: Payer: Self-pay | Admitting: Hematology

## 2022-04-09 DIAGNOSIS — C9110 Chronic lymphocytic leukemia of B-cell type not having achieved remission: Secondary | ICD-10-CM

## 2022-04-09 MED ORDER — VENETOCLAX 100 MG PO TABS
200.0000 mg | ORAL_TABLET | Freq: Every day | ORAL | 2 refills | Status: DC
  Filled 2022-04-09: qty 60, 30d supply, fill #0
  Filled 2022-05-06: qty 60, 30d supply, fill #1
  Filled 2022-06-07: qty 60, 30d supply, fill #2

## 2022-04-12 ENCOUNTER — Other Ambulatory Visit: Payer: Self-pay | Admitting: Family Medicine

## 2022-04-13 ENCOUNTER — Other Ambulatory Visit: Payer: Self-pay

## 2022-04-13 ENCOUNTER — Other Ambulatory Visit (HOSPITAL_COMMUNITY): Payer: Self-pay

## 2022-04-13 MED ORDER — ALBUTEROL SULFATE HFA 108 (90 BASE) MCG/ACT IN AERS: 2.0000 | INHALATION_SPRAY | Freq: Four times a day (QID) | RESPIRATORY_TRACT | 2 refills | Status: DC | PRN

## 2022-04-14 ENCOUNTER — Ambulatory Visit: Payer: Medicare Other | Admitting: Hematology

## 2022-04-14 ENCOUNTER — Telehealth: Payer: Self-pay

## 2022-04-14 ENCOUNTER — Other Ambulatory Visit: Payer: Medicare Other

## 2022-04-14 NOTE — Telephone Encounter (Signed)
[facility] PT calls nurse line requesting verbal orders for [facility] PT as follows.  ? ?1 week 1 ?2 week 4 ?1 week 4  ? ?Attempted to leave verbal orders on VM, however not secure.  ?

## 2022-04-16 ENCOUNTER — Telehealth: Payer: Self-pay

## 2022-04-16 DIAGNOSIS — M62838 Other muscle spasm: Secondary | ICD-10-CM

## 2022-04-16 MED ORDER — CYCLOBENZAPRINE HCL 5 MG PO TABS: 5.0000 mg | ORAL_TABLET | Freq: Three times a day (TID) | ORAL | 0 refills | Status: DC | PRN

## 2022-04-16 NOTE — Telephone Encounter (Signed)
Patient calls nurse line requesting a refill on Flexeril.  ? ?Looks like prescription was sent in on 4/12, however no quantity.  ? ?Will forward to PCP to advise.  ? ?Patients pharmacy is waiting to send her pill packs out.  ?

## 2022-04-16 NOTE — Telephone Encounter (Signed)
As discussed during our visit on 4/11, we will be reducing Flexeril dose from '10mg'$  TID prn to '5mg'$  TID prn, with goal of eventually discontinuing altogether. ? ?Rx sent for Flexeril '5mg'$  tablets. ?PRN medications are not included in her pill packs from [facility] so this should not delay sending. ? ? ?

## 2022-04-18 ENCOUNTER — Encounter (HOSPITAL_COMMUNITY): Payer: Self-pay

## 2022-04-18 ENCOUNTER — Emergency Department (HOSPITAL_COMMUNITY)
Admission: EM | Admit: 2022-04-18 | Discharge: 2022-04-18 | Disposition: A | Discharge status: Home / Self Care | Payer: Medicare Other | Attending: Emergency Medicine | Admitting: Emergency Medicine

## 2022-04-18 ENCOUNTER — Other Ambulatory Visit: Payer: Self-pay

## 2022-04-18 DIAGNOSIS — F17210 Nicotine dependence, cigarettes, uncomplicated: Secondary | ICD-10-CM | POA: Diagnosis not present

## 2022-04-18 DIAGNOSIS — J449 Chronic obstructive pulmonary disease, unspecified: Secondary | ICD-10-CM | POA: Diagnosis not present

## 2022-04-18 DIAGNOSIS — E1122 Type 2 diabetes mellitus with diabetic chronic kidney disease: Secondary | ICD-10-CM | POA: Insufficient documentation

## 2022-04-18 DIAGNOSIS — Z85841 Personal history of malignant neoplasm of brain: Secondary | ICD-10-CM | POA: Diagnosis not present

## 2022-04-18 DIAGNOSIS — I130 Hypertensive heart and chronic kidney disease with heart failure and stage 1 through stage 4 chronic kidney disease, or unspecified chronic kidney disease: Secondary | ICD-10-CM | POA: Insufficient documentation

## 2022-04-18 DIAGNOSIS — K047 Periapical abscess without sinus: Secondary | ICD-10-CM

## 2022-04-18 DIAGNOSIS — N189 Chronic kidney disease, unspecified: Secondary | ICD-10-CM | POA: Insufficient documentation

## 2022-04-18 DIAGNOSIS — J45909 Unspecified asthma, uncomplicated: Secondary | ICD-10-CM | POA: Insufficient documentation

## 2022-04-18 DIAGNOSIS — K0889 Other specified disorders of teeth and supporting structures: Secondary | ICD-10-CM | POA: Diagnosis present

## 2022-04-18 DIAGNOSIS — I5032 Chronic diastolic (congestive) heart failure: Secondary | ICD-10-CM | POA: Diagnosis not present

## 2022-04-18 MED ORDER — HYDROCODONE-ACETAMINOPHEN 5-325 MG PO TABS
1.0000 | ORAL_TABLET | ORAL | 0 refills | Status: DC | PRN
Start: 2022-04-18 — End: 2022-05-31

## 2022-04-18 MED ORDER — AMOXICILLIN 500 MG PO CAPS
500.0000 mg | ORAL_CAPSULE | Freq: Once | ORAL | Status: AC
  Administered 2022-04-18: 500 mg via ORAL
  Filled 2022-04-18: qty 1

## 2022-04-18 MED ORDER — OXYCODONE HCL 5 MG PO TABS
5.0000 mg | ORAL_TABLET | Freq: Once | ORAL | Status: AC
  Administered 2022-04-18: 5 mg via ORAL
  Filled 2022-04-18: qty 1

## 2022-04-18 MED ORDER — AMOXICILLIN 500 MG PO CAPS: 500.0000 mg | ORAL_CAPSULE | Freq: Three times a day (TID) | ORAL | 0 refills | Status: AC

## 2022-04-18 NOTE — ED Provider Notes (Signed)
?[place] DEPT ?[facility] Emergency Department ?Provider Note ?MRN:  [id number]  ?Arrival date & time: 04/18/22    ? ?Chief Complaint   ?Tooth Pain  ?History of Present Illness   ?[name] [name] is a 76 y.o. year-old female with a history of COPD, CHF presenting to the ED with chief complaint of tooth pain. ? ?Left-sided tooth pain for a few weeks, much worse this evening.  No fever, no other complaints. ? ?Review of Systems  ?A thorough review of systems was obtained and all systems are negative except as noted in the HPI and PMH.  ? ?Patient's Health History   ? ?Past Medical History:  ?Diagnosis Date  ? Allergy   ? environmental  ? Asthma   ? Brain tumor ([place])   ? Chronic diastolic CHF (congestive heart failure) ([place])   ? CKD (chronic kidney disease)   ? CLL (chronic lymphocytic leukemia) ([place])   ? Constipation 11/15/2011  ? COPD (chronic obstructive pulmonary disease) ([place])   ? CVA (cerebral vascular accident) [facility])   ? Diabetes ([place])   ? GERD (gastroesophageal reflux disease)   ? Headache   ? migraines  ? Hyperlipidemia   ? Hypertension   ? PVD (peripheral vascular disease) ([place])   ? Stroke [facility]) 1998  ? in 1998 due to tumor-right side  ?  ?Past Surgical History:  ?Procedure Laterality Date  ? ABDOMINAL AORTOGRAM W/LOWER EXTREMITY Bilateral 08/12/2020  ? Procedure: ABDOMINAL AORTOGRAM W/BILATERAL LOWER EXTREMITY RUNOFF;  Surgeon: Serafina Mitchell, MD;  Location: [facility] CV LAB;  Service: Cardiovascular;  Laterality: Bilateral;  ? ABI  02/2012  ? ABI <0.65 BL 02/2012  ? BRAIN MENINGIOMA EXCISION    ? BRAIN TUMOR EXCISION    ? Bypass grafting of RLE for PAD claudication     ? CARDIAC CATHETERIZATION  04/09/208  ? CESAREAN SECTION    ? 1974, 78 ,80  ? CHOLECYSTECTOMY, LAPAROSCOPIC    ? COLONOSCOPY  2014  ? [place]  ? ENDARTERECTOMY FEMORAL Right 10/03/2020  ? Procedure: RIGHT FEMORAL ENDARTERECTOMY WITH VEIN PATCH ANGIOPLASTY;  Surgeon: Serafina Mitchell, MD;  Location: [facility] OR;  Service:  Vascular;  Laterality: Right;  ? ESOPHAGEAL DILATION    ? FEMORAL-POPLITEAL BYPASS GRAFT Right 10/03/2020  ? Procedure: RIGHT FEMORAL- BELOW KNEE POPLITEAL BYPASS USING GORE PROPATEN 6MM GRAFT;  Surgeon: Serafina Mitchell, MD;  Location: [facility];  Service: Vascular;  Laterality: Right;  ? PATCH ANGIOPLASTY  10/03/2020  ? Procedure: VEIN PATCH ANGIOPLASTY;  Surgeon: Serafina Mitchell, MD;  Location: [facility];  Service: Vascular;;  ?  ?Family History  ?Problem Relation Age of Onset  ? Heart disease Mother   ? Asthma Mother   ? Cancer Mother   ?     uterine   ? Depression Mother   ? Heart attack Mother 30  ? Hyperlipidemia Mother   ? Hypertension Mother   ? Stroke Mother   ? Kidney disease Mother   ? Heart attack Sister   ? Stroke Sister   ? Depression Sister   ? Diabetes Sister   ? Hyperlipidemia Sister   ? Depression Sister   ? Diabetes Sister   ? Hyperlipidemia Sister   ? HIV/AIDS Sister   ? Diabetes Brother   ? Asthma Brother   ? Hypertension Brother   ? Cancer Maternal Aunt   ?     lung  ? Heart disease Maternal Grandmother   ? Heart attack Maternal Grandmother   ? Cancer  Brother   ?     colon  ? HIV/AIDS Brother   ? Colon cancer Neg Hx   ?  ?Social History  ? ?Socioeconomic History  ? Marital status: Single  ?  Spouse name: Not on file  ? Number of children: 3  ? Years of education: Not on file  ? Highest education level: Not on file  ?Occupational History  ? Occupation: DISABLED  ?  Employer: DISABLED  ?Tobacco Use  ? Smoking status: Every Day  ?  Packs/day: 0.25  ?  Years: 30.00  ?  Pack years: 7.50  ?  Types: Cigarettes  ?  Start date: 01/01/1976  ? Smokeless tobacco: Never  ? Tobacco comments:  ?  ~ 3 cigarettes / day  ?Vaping Use  ? Vaping Use: Never used  ?Substance and Sexual Activity  ? Alcohol use: Not Currently  ? Drug use: Not Currently  ? Sexual activity: Yes  ?Other Topics Concern  ? Not on file  ?Social History Narrative  ? ** Merged History Encounter **  ?    ? Health Care POA:  ?Emergency Contact: Val Riles [phone] (c) ?End of Life Plan:  ?Who lives with you: Lives with husband ?Any pets: none ?Diet: Patient lacks financial resources for much food. Pt reports eating what is available. ?Exercise: Patient   ? does not have an exercise plan. ?Seatbelts: Patient reports wearing seatbelt when in vehicle.  ?[name] Exposure/Protection: ?Hobbies: Bowling, computer games, Bingo ? ?Has financial difficulties and transportation issues as she and her husband share transpo  ? rtation  ? ?  ? ?Social Determinants of Health  ? ?Financial Resource Strain: High Risk  ? Difficulty of Paying Living Expenses: Hard  ?Food Insecurity: Not on file  ?Transportation Needs: Not on file  ?Physical Activity: Not on file  ?Stress: Not on file  ?Social Connections: Not on file  ?Intimate Partner Violence: Not on file  ?  ? ?Physical Exam  ? ?Vitals:  ? 04/18/22 0137  ?BP: 137/74  ?Pulse: 80  ?Resp: 20  ?Temp: 98.5 ?F (36.9 ?C)  ?SpO2: 96%  ?  ?CONSTITUTIONAL: Well-appearing, NAD ?NEURO/PSYCH:  Alert and oriented x 3, no focal deficits ?EYES:  eyes equal and reactive ?ENT/NECK:  no LAD, no JVD; poor dentition, tender to the left mandibular molar ?CARDIO: Regular rate, well-perfused, normal S1 and S2 ?PULM:  CTAB no wheezing or rhonchi ?GI/GU:  non-distended, non-tender ?MSK/SPINE:  No gross deformities, no edema ?SKIN:  no rash, atraumatic ? ? ?*Additional and/or pertinent findings included in MDM below ? ?Diagnostic and Interventional Summary  ? ? EKG Interpretation ? ?Date/Time:    ?Ventricular Rate:    ?PR Interval:    ?QRS Duration:   ?QT Interval:    ?QTC Calculation:   ?R Axis:     ?Text Interpretation:   ?  ? ?  ? ?Labs Reviewed - No data to display  ?No orders to display  ?  ?Medications  ?oxyCODONE (Oxy IR/ROXICODONE) immediate release tablet 5 mg (5 mg Oral Given 04/18/22 0338)  ?amoxicillin (AMOXIL) capsule 500 mg (500 mg Oral Given 04/18/22 0338)  ?  ? ?Procedures  /  Critical Care ?Procedures ? ?ED Course and Medical Decision Making   ?Initial Impression and Ddx ?No signs of abscess, no systemic symptoms, seems consistent with a simple tooth infection.  Providing amoxicillin, pain control. ? ?Past medical/surgical history that increases complexity of ED encounter: None ? ?Interpretation of Diagnostics ?Not applicable ? ?Patient Reassessment and  Ultimate Disposition/Management ?Discharge home ? ?Patient management required discussion with the following services or consulting groups:  None ? ?Complexity of Problems Addressed ?Acute complicated illness or Injury ? ?Additional Data Reviewed and Analyzed ?Further history obtained from: ?Further history from spouse/family member ? ?Additional Factors Impacting ED Encounter Risk ?Prescriptions ? ?Barth Kirks. [place]onia Small, MD ?[facility] Emergency Medicine ?[email or web address] ? ?Final Clinical Impressions(s) / ED Diagnoses  ? ?  ICD-10-CM   ?1. Tooth infection  K04.7   ?  ?  ?ED Discharge Orders   ? ?      Ordered  ?  amoxicillin (AMOXIL) 500 MG capsule  3 times daily       ? 04/18/22 0339  ?  HYDROcodone-acetaminophen (NORCO/VICODIN) 5-325 MG tablet  Every 4 hours PRN       ? 04/18/22 0339  ? ?  ?  ? ?  ?  ? ?Discharge Instructions Discussed with and Provided to Patient:  ? ? ?Discharge Instructions   ? ?  ?You were evaluated in the Emergency Department and after careful evaluation, we did not find any emergent condition requiring admission or further testing in the hospital. ? ?Your exam/testing today is overall reassuring.  Symptoms likely due to a tooth infection.  Please take the amoxicillin antibiotic as directed.  Can use the Norco medication as needed for significant pain.  Recommend follow-up with a dentist. ? ?Please return to the Emergency Department if you experience any worsening of your condition.   Thank you for allowing [place] to be a part of your care. ? ? ? ?  ?Maudie Flakes, MD ?04/18/22 [phone] ? ?

## 2022-04-18 NOTE — ED Triage Notes (Signed)
Pt reports dental pain x2 weeks. Pt reports the pain is worse tonight.Pt reports pain is located on the left back side of her mouth. Pt reports using salt water and tylenol with no relief.  ?

## 2022-04-18 NOTE — Discharge Instructions (Signed)
You were evaluated in the Emergency Department and after careful evaluation, we did not find any emergent condition requiring admission or further testing in the hospital. ? ?Your exam/testing today is overall reassuring.  Symptoms likely due to a tooth infection.  Please take the amoxicillin antibiotic as directed.  Can use the Norco medication as needed for significant pain.  Recommend follow-up with a dentist. ? ?Please return to the Emergency Department if you experience any worsening of your condition.   Thank you for allowing [place] to be a part of your care. ?

## 2022-04-27 ENCOUNTER — Other Ambulatory Visit: Payer: Self-pay | Admitting: Family Medicine

## 2022-04-27 ENCOUNTER — Other Ambulatory Visit (HOSPITAL_COMMUNITY): Payer: Self-pay

## 2022-04-27 DIAGNOSIS — M62838 Other muscle spasm: Secondary | ICD-10-CM

## 2022-04-28 ENCOUNTER — Ambulatory Visit
Admission: RE | Admit: 2022-04-28 | Discharge: 2022-04-28 | Disposition: A | Discharge status: Home / Self Care | Payer: Medicare Other | Source: Ambulatory Visit | Attending: Family Medicine | Admitting: Family Medicine

## 2022-04-28 ENCOUNTER — Encounter: Payer: Self-pay | Admitting: Family Medicine

## 2022-04-28 ENCOUNTER — Ambulatory Visit (INDEPENDENT_AMBULATORY_CARE_PROVIDER_SITE_OTHER): Payer: Medicare Other | Admitting: Family Medicine

## 2022-04-28 ENCOUNTER — Ambulatory Visit (INDEPENDENT_AMBULATORY_CARE_PROVIDER_SITE_OTHER): Payer: Medicare Other

## 2022-04-28 VITALS — BP 130/72 | HR 78 | Ht 62.0 in | Wt 134.0 lb

## 2022-04-28 DIAGNOSIS — M542 Cervicalgia: Secondary | ICD-10-CM | POA: Diagnosis not present

## 2022-04-28 DIAGNOSIS — Z23 Encounter for immunization: Secondary | ICD-10-CM

## 2022-04-28 NOTE — Progress Notes (Signed)
? ? ?  SUBJECTIVE:  ? ?CHIEF COMPLAINT / HPI:  ? ?Neck Pain ?Right sided ?Present x4 months per patient report ?Worse recently ?No injury or trauma that she can recall ?Pain is constant but worse when turning head to the right ?Feels a popping sensation several times per day ?Tried heating pad without relief ?Takes Flexeril '5mg'$  2-3 times daily for muscle spasms in hands but this hasn't helped with the neck pain ?Denies numbness or tingling in arms ?Denies systemic symptoms ? ? ?PERTINENT  PMH / PSH: CLL, COPD, T2DM, HTN, CKD ? ?OBJECTIVE:  ? ?BP 130/72   Pulse 78   Ht '5\' 2"'$  (1.575 m)   Wt 134 lb (60.8 kg)   BMI 24.51 kg/m?   ?General: NAD, pleasant, able to participate in exam ?Respiratory: No respiratory distress ?Skin: warm and dry, no rashes noted ?Psych: Normal affect and mood ?Neuro: grossly intact ?MSK:  ?neck- inspection reveals no obvious deformity or skin changes. There is no midline or bony tenderness. There is moderate tenderness to palpation of musculature on the right at the superior aspect of posterior neck. Audible popping heard intermittently with palpation of right posterior neck. ROM limited with extension and turning head to right secondary to pain. Flexion normal. Sensation intact to light touch bilateral upper extremities. 5/5 strength in bilateral upper extremities. ? ? ?ASSESSMENT/PLAN:  ? ?Neck pain ?Chronic x4 months per patient report but I suspect it's more acute than this as I saw her 3 weeks ago and it did not appear to be an issue at that time. ?Most likely cervical muscle strain vs flare of degenerative disease. Less likely radiculopathy or fracture. ?-Obtain x-rays of c-spine ?-Advised supportive care with scheduled Tylenol, frequent heating pad use ?-Already receiving home health PT services, encouraged her to work with them for her neck pain ?-Continue Flexeril '5mg'$  TID prn for now, although we're working together to eventually wean off this ?  ? ? ?Alcus Dad, MD ?[place]  ?

## 2022-04-28 NOTE — Patient Instructions (Addendum)
It was great to see you! ? ?For your neck pain ?-I have ordered an x-ray. You can go to [facility] ([place]) any time between 7am-7pm to have this done. No appointment needed.  ? ?-Start taking Tylenol '1000mg'$  three times per day for the next 5-7 days ? ?-Use a heating pad for 15-20 minutes 2-3 times per day for the next 5-7 days ? ?-Please let me know if your home health services do not include physical therapy ? ?-If you are not improving after 2 weeks lets me know. ? ?Take care and seek immediate care sooner if you develop any concerns. ? ?Dr. Rock Nephew ?[facility]  ?

## 2022-04-29 DIAGNOSIS — M542 Cervicalgia: Secondary | ICD-10-CM | POA: Insufficient documentation

## 2022-04-29 NOTE — Assessment & Plan Note (Addendum)
Chronic x4 months per patient report but I suspect it's more acute than this as I saw her 3 weeks ago and it did not appear to be an issue at that time. ?Most likely cervical muscle strain vs flare of degenerative disease. Less likely radiculopathy or fracture. ?-Obtain x-rays of c-spine ?-Advised supportive care with scheduled Tylenol, frequent heating pad use ?-Already receiving home health PT services, encouraged her to work with them for her neck pain ?-Continue Flexeril '5mg'$  TID prn for now, although we're working together to eventually wean off this ?

## 2022-05-01 ENCOUNTER — Other Ambulatory Visit: Payer: Self-pay | Admitting: Family Medicine

## 2022-05-04 ENCOUNTER — Other Ambulatory Visit (HOSPITAL_COMMUNITY): Payer: Self-pay

## 2022-05-05 ENCOUNTER — Inpatient Hospital Stay: Payer: Medicare Other | Attending: Hematology

## 2022-05-05 ENCOUNTER — Other Ambulatory Visit: Payer: Self-pay

## 2022-05-05 ENCOUNTER — Inpatient Hospital Stay (HOSPITAL_BASED_OUTPATIENT_CLINIC_OR_DEPARTMENT_OTHER): Payer: Medicare Other | Admitting: Hematology

## 2022-05-05 VITALS — BP 114/79 | HR 70 | Temp 97.5°F | Resp 18 | Wt 136.4 lb

## 2022-05-05 DIAGNOSIS — C9110 Chronic lymphocytic leukemia of B-cell type not having achieved remission: Secondary | ICD-10-CM | POA: Diagnosis not present

## 2022-05-05 DIAGNOSIS — Z87891 Personal history of nicotine dependence: Secondary | ICD-10-CM | POA: Insufficient documentation

## 2022-05-05 DIAGNOSIS — R918 Other nonspecific abnormal finding of lung field: Secondary | ICD-10-CM | POA: Insufficient documentation

## 2022-05-05 LAB — CMP (CANCER CENTER ONLY)
ALT: 16 U/L (ref 0–44)
AST: 18 U/L (ref 15–41)
Albumin: 3.9 g/dL (ref 3.5–5.0)
Alkaline Phosphatase: 173 U/L — ABNORMAL HIGH (ref 38–126)
Anion gap: 6 (ref 5–15)
BUN: 10 mg/dL (ref 8–23)
CO2: 28 mmol/L (ref 22–32)
Calcium: 9.2 mg/dL (ref 8.9–10.3)
Chloride: 106 mmol/L (ref 98–111)
Creatinine: 1.28 mg/dL — ABNORMAL HIGH (ref 0.44–1.00)
GFR, Estimated: 44 mL/min — ABNORMAL LOW (ref >60)
Glucose, Bld: 150 mg/dL — ABNORMAL HIGH (ref 70–99)
Potassium: 3.6 mmol/L (ref 3.5–5.1)
Sodium: 140 mmol/L (ref 135–145)
Total Bilirubin: 0.5 mg/dL (ref 0.3–1.2)
Total Protein: 6.8 g/dL (ref 6.5–8.1)

## 2022-05-05 LAB — CBC WITH DIFFERENTIAL (CANCER CENTER ONLY)
Abs Immature Granulocytes: 0.02 10*3/uL (ref 0.00–0.07)
Basophils Absolute: 0 10*3/uL (ref 0.0–0.1)
Basophils Relative: 1 %
Eosinophils Absolute: 0 10*3/uL (ref 0.0–0.5)
Eosinophils Relative: 0 %
HCT: 40.8 % (ref 36.0–46.0)
Hemoglobin: 12.8 g/dL (ref 12.0–15.0)
Immature Granulocytes: 0 %
Lymphocytes Relative: 29 %
Lymphs Abs: 2.2 10*3/uL (ref 0.7–4.0)
MCH: 28.7 pg (ref 26.0–34.0)
MCHC: 31.4 g/dL (ref 30.0–36.0)
MCV: 91.5 fL (ref 80.0–100.0)
Monocytes Absolute: 1.4 10*3/uL — ABNORMAL HIGH (ref 0.1–1.0)
Monocytes Relative: 19 %
Neutro Abs: 3.9 10*3/uL (ref 1.7–7.7)
Neutrophils Relative %: 51 %
Platelet Count: 173 10*3/uL (ref 150–400)
RBC: 4.46 MIL/uL (ref 3.87–5.11)
RDW: 16.8 % — ABNORMAL HIGH (ref 11.5–15.5)
WBC Count: 7.6 10*3/uL (ref 4.0–10.5)
nRBC: 0 % (ref 0.0–0.2)

## 2022-05-05 LAB — LACTATE DEHYDROGENASE: LDH: 135 U/L (ref 98–192)

## 2022-05-06 ENCOUNTER — Other Ambulatory Visit (HOSPITAL_COMMUNITY): Payer: Self-pay

## 2022-05-10 ENCOUNTER — Other Ambulatory Visit (HOSPITAL_COMMUNITY): Payer: Self-pay

## 2022-05-11 ENCOUNTER — Encounter: Payer: Self-pay | Admitting: Hematology

## 2022-05-11 ENCOUNTER — Telehealth: Payer: Self-pay | Admitting: Hematology

## 2022-05-11 ENCOUNTER — Other Ambulatory Visit: Payer: Self-pay

## 2022-05-11 DIAGNOSIS — M62838 Other muscle spasm: Secondary | ICD-10-CM

## 2022-05-11 NOTE — Progress Notes (Signed)
?  ? ?HEMATOLOGY/ONCOLOGY CLINIC NOTE ? ?Date of Service: .05/05/2022 ? ? ?Patient Care Team: ?Alcus Dad, MD as PCP - General (Family Medicine) ?Sueanne Margarita, MD as PCP - Cardiology (Cardiology) ?Mullis, Kiersten P, DO (Inactive) (Family Medicine) ?Lazaro Arms, RN as Case Manager ? ?CHIEF COMPLAINTS/PURPOSE OF CONSULTATION:  ?Follow-up for continued evaluation and management of CLL ? ?HISTORY OF PRESENTING ILLNESS:  ?Please see previous note for details on initial presentation ? ?INTERVAL HISTORY: ? ?[name] [name] is here for her scheduled 17-monthfollow-up for continued valuation and management of CLL.  She notes no acute new symptoms related to her CLL since her last clinic visit.  No issues with infections.  No notable side effects from her venetoclax at 200 mg p.o. daily. ?Notes that she is still having some issues with her balance and has been seeing physical therapy to work on balance and to reduce risk of falls. ?No new chest pain or shortness of breath.  No new pedal edema..Marland Kitchen?Labs from today were discussed in detail with the patient. ? ?MEDICAL HISTORY:  ?Past Medical History:  ?Diagnosis Date  ? Allergy   ? environmental  ? Asthma   ? Brain tumor ([place]   ? Chronic diastolic CH[place] (congestive heart failure) ([facility]   ? CKD (chronic kidney disease)   ? C[place]ocytic [place]) ([place]   ? Constipation 11/15/2011  ? COPD (ch[place] obstructive pulmonary disease) ([place]   ? CVA (cerebral vascular accident) ([facility]   ? Diabetes ([place]   ? GERD (gastroesophageal reflux disease)   ? Headache   ? migraines  ? Hyperlipidemia   ? Hypertension   ? PVD (peripheral vascular disease) ([place]   ? Stroke ([facility]  ? in 1998 due to tumor-right side  ? ? ?SURGICAL HISTORY: ?Past Surgical History:  ?Procedure Laterality Date  ? ABDOMINAL AORTOGRAM W/LOWER EXTREMITY Bilateral 08/12/2020  ? Procedure: ABDOMINAL AORTOGRAM W/BILATERAL LOWER EXTREMITY RUNOFF;  Surgeon: BSerafina Mitchell MD;  [facility];   Service: Cardiovascular;  Laterality: Bilateral;  ? ABI  02/2012  ? ABI <0.65 BL 02/2012  ? BRAIN MENINGIOMA EXCISION    ? BRAIN TUMOR EXCISION    ? Bypass grafting of RLE for PAD claudication     ? CARDIAC CATHETERIZATION  04/09/208  ? CESAREAN SECTION    ? 1974, 78 ,80  ? CHOLECYSTECTOMY, LAPAROSCOPIC    ? COLONOSCOPY  2014  ? [place], [place]  ? ENDARTERECTOMY FEMORAL Right 10/03/2020  ? Procedure: RIGHT FEMORAL ENDARTERECTOMY WITH VEIN PATCH ANGIOPLASTY;  Surgeon: BSerafina Mitchell MD;  Location: [facility];  Service: Vascular;  Laterality: Right;  ? ESOPHAGEAL DILATION    ? FEMORAL-POPLITEAL BYPASS GRAFT Right 10/03/2020  ? Procedure: RIGHT FEMORAL- BELOW KNEE POPLITEAL BYPASS USING GORE PROPATEN 6MM GRAFT;  Surgeon: BSerafina Mitchell MD;  [place]  Service: Vascular;  Laterality: Right;  ? PATCH ANGIOPLASTY  10/03/2020  ? Procedure: VEIN PATCH ANGIOPLASTY;  Surgeon: BSerafina Mitchell MD;  Location: [facility] OR;  Service: Vascular;;  ? ? ?SOCIAL HISTORY: ?Social History  ? ?Socioeconomic History  ? Marital status: Single  ?  Spouse name: Not on file  ? Number of children: 3  ? Years of education: Not on file  ? Highest education level: Not on file  ?Occupational History  ? Occupation: DISABLED  ?  Employer: DISABLED  ?Tobacco Use  ? Smoking status: Every Day  ?  Packs/day: 0.25  ?  Years: 30.00  ?  Pack  years: 7.50  ?  Types: Cigarettes  ?  Start date: 01/01/1976  ? Smokeless tobacco: Never  ? Tobacco comments:  ?  ~ 3 cigarettes / day  ?Vaping Use  ? Vaping Use: Never used  ?Substance and Sexual Activity  ? Alcohol use: Not Currently  ? Drug use: Not Currently  ? Sexual activity: Yes  ?Other Topics Concern  ? Not on file  ?Social History Narrative  ? ** Merged History Encounter **  ?    ? Health Care POA:  ?Emergency Contact: Val Riles [phone] (c) ?End of Life Plan:  ?Who lives with you: Lives with husband ?Any pets: none ?Diet: Patient lacks financial resources for much food. Pt reports eating what is  available. ?Exercise: Patient   ? does not have an exercise plan. ?Seatbelts: Patient reports wearing seatbelt when in vehicle.  ?[name] Exposure/Protection: ?Hobbies: Bowling, computer games, Bingo ? ?Has financial difficulties and transportation issues as she and her husband share transpo  ? rtation  ? ?  ? ?Social Determinants of Health  ? ?Financial Resource Strain: High Risk  ? Difficulty of Paying Living Expenses: Hard  ?Food Insecurity: Not on file  ?Transportation Needs: Not on file  ?Physical Activity: Not on file  ?Stress: Not on file  ?Social Connections: Not on file  ?Intimate Partner Violence: Not on file  ? ? ?FAMILY HISTORY: ?Family History  ?Problem Relation Age of Onset  ? Heart disease Mother   ? Asthma Mother   ? Cancer Mother   ?     uterine   ? Depression Mother   ? Heart attack Mother 12  ? Hyperlipidemia Mother   ? Hypertension Mother   ? Stroke Mother   ? Kidney disease Mother   ? Heart attack Sister   ? Stroke Sister   ? Depression Sister   ? Diabetes Sister   ? Hyperlipidemia Sister   ? Depression Sister   ? Diabetes Sister   ? Hyperlipidemia Sister   ? HIV/AIDS Sister   ? Diabetes Brother   ? Asthma Brother   ? Hypertension Brother   ? Cancer Maternal Aunt   ?     lung  ? Heart disease Maternal Grandmother   ? Heart attack Maternal Grandmother   ? Cancer Brother   ?     colon  ? HIV/AIDS Brother   ? Colon cancer Neg Hx   ? ? ?ALLERGIES:  is allergic to gazyva [obinutuzumab], aspirin, no healthtouch food allergies, aspirin, and baclofen. ? ?MEDICATIONS:  ?Current Outpatient Medications  ?Medication Sig Dispense Refill  ? acetaminophen (TYLENOL) 500 MG tablet Take 500-1,000 mg by mouth every 6 (six) hours as needed for moderate pain.    ? albuterol (PROVENTIL) (2.5 MG/3ML) 0.083% nebulizer solution Inhale 1 vial via nebulizer every 6 hours for wheezing or shortness of breath as needed 75 mL 11  ? albuterol (VENTOLIN HFA) 108 (90 Base) MCG/ACT inhaler Inhale 2 puffs into the lungs every 6 (six)  hours as needed for wheezing or shortness of breath. 8.5 each 2  ? Alcohol Swabs (ALCOHOL PADS) 70 % PADS     ? amLODipine (NORVASC) 10 MG tablet Take 1 tablet (10 mg total) by mouth daily. 90 tablet 3  ? atorvastatin (LIPITOR) 80 MG tablet Take 1 tablet (80 mg total) by mouth daily. 90 tablet 3  ? Blood Glucose Calibration (OT ULTRA/FASTTK CNTRL SOLN) SOLN See admin instructions.    ? Blood Glucose Monitoring Suppl (ACCU-CHEK GUIDE ME) w/Device KIT  Use as directed,once daily to test blood sugar 1 kit 1  ? clopidogrel (PLAVIX) 75 MG tablet Take 1 tablet by mouth every day 30 tablet 11  ? cyclobenzaprine (FLEXERIL) 5 MG tablet Take 1 tablet (5 mg total) by mouth 3 (three) times daily as needed for muscle spasms. 30 tablet 0  ? DULoxetine (CYMBALTA) 30 MG capsule Take 1 capsule by mouth every day 30 capsule 11  ? famotidine (PEPCID) 20 MG tablet Take 1 tablet by mouth twice daily 60 tablet 11  ? fluticasone furoate-vilanterol (BREO ELLIPTA) 100-25 MCG/ACT AEPB Inhale 1 puff by mouth every day 60 each 11  ? furosemide (LASIX) 20 MG tablet Take 1 tablet (20 mg total) by mouth daily. 90 tablet 3  ? gabapentin (NEURONTIN) 300 MG capsule take 2 capsules by mouth twice daily 120 capsule 11  ? glucose blood (ONETOUCH VERIO) test strip Use to check blood sugar once a day 100 each 12  ? hydrALAZINE (APRESOLINE) 25 MG tablet Take 1 tablet (25 mg total) by mouth 2 (two) times daily. 180 tablet 3  ? HYDROcodone-acetaminophen (NORCO/VICODIN) 5-325 MG tablet Take 1 tablet by mouth every 4 (four) hours as needed. 6 tablet 0  ? iron polysaccharides (NIFEREX) 150 MG capsule Take 1 capsule (150 mg total) by mouth daily. 30 capsule 5  ? JARDIANCE 25 MG TABS tablet Take 1 tablet by mouth every day 30 tablet 11  ? Lancet Devices (EASY MINI EJECT LANCING DEVICE) MISC daily.    ? Lancets (ONETOUCH ULTRASOFT) lancets Use to check blood sugar once a day 100 each 12  ? metFORMIN (GLUCOPHAGE) 500 MG tablet Take 1 tablet by mouth every day 30  tablet 11  ? montelukast (SINGULAIR) 10 MG tablet Take 1 tablet (10 mg total) by mouth at bedtime. 30 tablet 11  ? nitroGLYCERIN (NITROSTAT) 0.4 MG SL tablet Please take as prescribed. 25 tablet 11  ? venetoclax (

## 2022-05-11 NOTE — Telephone Encounter (Signed)
Scheduled follow-up appointment per 5/10 los. Patient is aware. Mailed calendar. ?

## 2022-05-12 MED ORDER — CYCLOBENZAPRINE HCL 5 MG PO TABS: 5.0000 mg | ORAL_TABLET | Freq: Two times a day (BID) | ORAL | 0 refills | Status: DC | PRN

## 2022-05-19 ENCOUNTER — Other Ambulatory Visit (HOSPITAL_COMMUNITY): Payer: Self-pay

## 2022-05-25 ENCOUNTER — Telehealth: Payer: Self-pay

## 2022-05-25 NOTE — Telephone Encounter (Signed)
Patient calls nurse line requesting a refill on Tramadol.   Patient reports she has tried using Tylenol and heating pads without relief.   Patient reports she has been crying herself to sleep most nights.   Patient has an upcoming apt on 6/5 with PCP.   Please advise.

## 2022-05-26 NOTE — Telephone Encounter (Signed)
Tramadol is not a current medication. It would not be appropriate to start this without in-person evaluation despite her using it in the past. We will discuss further at her upcoming appointment. If her pain is severe enough to warrant sooner evaluation, she should schedule an access-to-care appointment.

## 2022-05-28 NOTE — Telephone Encounter (Signed)
Attempted to contact patient, however no answer.   Can FU at PCP apt.

## 2022-05-30 NOTE — Progress Notes (Unsigned)
    SUBJECTIVE:   CHIEF COMPLAINT / HPI:   Neck pain follow-up Seen 1 month ago for right-sided neck pain.  Pain had been present x4 months at that time per her report.  Thought to be cervical muscle strain versus flare of her degenerative disease.  X-ray showed degenerative changes C4-C5 and C5-C6.  Also showed 2 mm anterolisthesis C3 on C4 and 3 mm retrolisthesis C4 on C5 (unlikely contributing to her pain).  Returns today reporting no improvement in pain.   PERTINENT  PMH / PSH: CLL, T2DM, CKD  OBJECTIVE:   There were no vitals taken for this visit.  ***  ASSESSMENT/PLAN:   No problem-specific Assessment & Plan notes found for this encounter.     Alcus Dad, MD [place]

## 2022-05-30 NOTE — Patient Instructions (Incomplete)
It was great to see you!  Things we discussed at today's visit: -Your A1c was 8.6% today. Ideally we'd like this number closer to 8.0. -Continue your Metformin and Jardiance. Check your sugars at home and try to watch your diet.  -We will recheck your A1c in 3 months.  -If it's high we may start a once weekly injectable medication - You are due for a diabetic eye exam. Please call your eye doctor. Have them fax me the results.  For your back pain: -Continue using Tylenol as needed, heating pad, physical therapy, etc. -We will resume your Tramadol. You can take this up to twice daily as needed. -Ideally you won't take it some days, and only take it at night most days.   Please schedule an appointment to see your kidney doctor and your cardiologist.  Take care and seek immediate care sooner if you develop any concerns.  Dr. [facility]

## 2022-05-31 ENCOUNTER — Ambulatory Visit (INDEPENDENT_AMBULATORY_CARE_PROVIDER_SITE_OTHER): Payer: Medicare Other | Admitting: Family Medicine

## 2022-05-31 ENCOUNTER — Encounter: Payer: Self-pay | Admitting: Family Medicine

## 2022-05-31 VITALS — BP 119/64 | HR 70 | Ht 62.0 in | Wt 129.8 lb

## 2022-05-31 DIAGNOSIS — M545 Low back pain, unspecified: Secondary | ICD-10-CM | POA: Diagnosis not present

## 2022-05-31 DIAGNOSIS — G8929 Other chronic pain: Secondary | ICD-10-CM

## 2022-05-31 DIAGNOSIS — E1151 Type 2 diabetes mellitus with diabetic peripheral angiopathy without gangrene: Secondary | ICD-10-CM | POA: Diagnosis not present

## 2022-05-31 DIAGNOSIS — M542 Cervicalgia: Secondary | ICD-10-CM

## 2022-05-31 LAB — POCT GLYCOSYLATED HEMOGLOBIN (HGB A1C): HbA1c, POC (controlled diabetic range): 8.6 % — AB (ref 0.0–7.0)

## 2022-05-31 MED ORDER — BLOOD GLUCOSE MONITOR KIT: PACK | 0 refills | Status: DC

## 2022-05-31 MED ORDER — TRAMADOL HCL 50 MG PO TABS: 50.0000 mg | ORAL_TABLET | Freq: Two times a day (BID) | ORAL | 0 refills | Status: DC | PRN

## 2022-05-31 NOTE — Assessment & Plan Note (Signed)
Fair control. A1c elevated to 8.6% today. Goal A1c 8.0%. Previously had been well controlled with A1c in 6-7 range.  -Continue Metformin '500mg'$  daily and Jardiance '25mg'$  daily -After shared decision making, patient opted to check sugars at home and recheck A1c in 3 months -If persistently elevated, will plan to initiate Ozempic 0.'25mg'$  weekly -Advised to schedule diabetic eye exam

## 2022-05-31 NOTE — Assessment & Plan Note (Addendum)
Chronic, worse over past few months. Attributed to degenerative disease. Previously well-managed with Tramadol '50mg'$  BID prn. No red flags on history or exam today. Some weight loss noted although patient reports her weight always fluctuates. -Will restart Tramadol '50mg'$  BID prn. -Counseled on using lowest effective dose, only taking prn. -Continue adjunct therapy with Tylenol, Gabapentin, Cymbalta, Flexeril, heating pad, physical therapy -If no improvement at follow up, consider repeat imaging

## 2022-06-01 ENCOUNTER — Encounter: Payer: Self-pay | Admitting: *Deleted

## 2022-06-03 ENCOUNTER — Telehealth: Payer: Self-pay

## 2022-06-03 MED ORDER — ACCU-CHEK GUIDE ME W/DEVICE KIT: PACK | 1 refills | Status: DC

## 2022-06-03 NOTE — Telephone Encounter (Signed)
Patient calls nurse line reporting diabetic testing supplies need to go to a local pharmacy.   Will resend to [facility] per patient request.

## 2022-06-07 ENCOUNTER — Other Ambulatory Visit (HOSPITAL_COMMUNITY): Payer: Self-pay

## 2022-06-08 ENCOUNTER — Other Ambulatory Visit: Payer: Self-pay | Admitting: Family Medicine

## 2022-06-08 DIAGNOSIS — M545 Low back pain, unspecified: Secondary | ICD-10-CM

## 2022-06-10 ENCOUNTER — Other Ambulatory Visit: Payer: Self-pay | Admitting: Family Medicine

## 2022-06-10 DIAGNOSIS — G8929 Other chronic pain: Secondary | ICD-10-CM

## 2022-06-18 ENCOUNTER — Other Ambulatory Visit (HOSPITAL_COMMUNITY): Payer: Self-pay

## 2022-06-21 ENCOUNTER — Other Ambulatory Visit: Payer: Self-pay

## 2022-06-21 DIAGNOSIS — M62838 Other muscle spasm: Secondary | ICD-10-CM

## 2022-06-22 MED ORDER — CYCLOBENZAPRINE HCL 5 MG PO TABS: 5.0000 mg | ORAL_TABLET | Freq: Two times a day (BID) | ORAL | 0 refills | Status: DC | PRN

## 2022-06-25 ENCOUNTER — Other Ambulatory Visit: Payer: Self-pay

## 2022-06-25 ENCOUNTER — Ambulatory Visit (HOSPITAL_COMMUNITY)
Admission: RE | Admit: 2022-06-25 | Discharge: 2022-06-25 | Disposition: A | Discharge status: Home / Self Care | Payer: Medicare Other | Source: Ambulatory Visit | Attending: Hematology | Admitting: Hematology

## 2022-06-25 DIAGNOSIS — C9110 Chronic lymphocytic leukemia of B-cell type not having achieved remission: Secondary | ICD-10-CM | POA: Diagnosis present

## 2022-06-25 LAB — POCT I-STAT CREATININE: Creatinine, Ser: 1.4 mg/dL — ABNORMAL HIGH (ref 0.44–1.00)

## 2022-06-25 MED ORDER — ONETOUCH DELICA LANCETS 30G MISC: 0 refills | Status: DC

## 2022-06-25 MED ORDER — SODIUM CHLORIDE (PF) 0.9 % IJ SOLN
INTRAMUSCULAR | Status: AC
  Filled 2022-06-25: qty 50

## 2022-06-25 MED ORDER — IOHEXOL 300 MG/ML  SOLN
100.0000 mL | Freq: Once | INTRAMUSCULAR | Status: AC | PRN
  Administered 2022-06-25: 100 mL via INTRAVENOUS

## 2022-06-26 ENCOUNTER — Other Ambulatory Visit: Payer: Self-pay | Admitting: Physical Medicine and Rehabilitation

## 2022-06-28 ENCOUNTER — Telehealth: Payer: Self-pay | Admitting: *Deleted

## 2022-06-28 NOTE — Telephone Encounter (Signed)
Several faxes received from [facility] for refills of medications that are not on patients current med list.  I have placed these in your box for review to determine if they should be sent in. Becci Batty Zimmerman Rumple, CMA

## 2022-07-05 ENCOUNTER — Ambulatory Visit: Payer: Medicare Other | Admitting: Hematology

## 2022-07-05 ENCOUNTER — Other Ambulatory Visit: Payer: Medicare Other

## 2022-07-08 ENCOUNTER — Other Ambulatory Visit (HOSPITAL_COMMUNITY): Payer: Self-pay

## 2022-07-11 ENCOUNTER — Other Ambulatory Visit: Payer: Self-pay | Admitting: Family Medicine

## 2022-07-11 ENCOUNTER — Other Ambulatory Visit: Payer: Self-pay | Admitting: Cardiology

## 2022-07-12 ENCOUNTER — Other Ambulatory Visit (HOSPITAL_COMMUNITY): Payer: Self-pay

## 2022-07-19 ENCOUNTER — Other Ambulatory Visit (HOSPITAL_COMMUNITY): Payer: Self-pay

## 2022-07-20 ENCOUNTER — Other Ambulatory Visit: Payer: Self-pay

## 2022-07-20 DIAGNOSIS — M62838 Other muscle spasm: Secondary | ICD-10-CM

## 2022-07-20 DIAGNOSIS — G8929 Other chronic pain: Secondary | ICD-10-CM

## 2022-07-21 MED ORDER — CYCLOBENZAPRINE HCL 5 MG PO TABS: 5.0000 mg | ORAL_TABLET | Freq: Two times a day (BID) | ORAL | 0 refills | Status: DC | PRN

## 2022-07-21 MED ORDER — TRAMADOL HCL 50 MG PO TABS: 50.0000 mg | ORAL_TABLET | Freq: Two times a day (BID) | ORAL | 0 refills | Status: DC | PRN

## 2022-07-23 ENCOUNTER — Other Ambulatory Visit: Payer: Self-pay | Admitting: *Deleted

## 2022-07-23 ENCOUNTER — Other Ambulatory Visit: Payer: Self-pay | Admitting: Family Medicine

## 2022-07-23 DIAGNOSIS — C9110 Chronic lymphocytic leukemia of B-cell type not having achieved remission: Secondary | ICD-10-CM

## 2022-07-27 ENCOUNTER — Other Ambulatory Visit (HOSPITAL_COMMUNITY): Payer: Self-pay

## 2022-07-27 ENCOUNTER — Inpatient Hospital Stay (HOSPITAL_BASED_OUTPATIENT_CLINIC_OR_DEPARTMENT_OTHER): Payer: Medicare Other | Admitting: Hematology

## 2022-07-27 ENCOUNTER — Inpatient Hospital Stay: Payer: Medicare Other | Attending: Hematology

## 2022-07-27 ENCOUNTER — Other Ambulatory Visit: Payer: Self-pay

## 2022-07-27 VITALS — BP 124/62 | HR 66 | Temp 97.9°F | Resp 17 | Ht 62.0 in | Wt 132.7 lb

## 2022-07-27 DIAGNOSIS — R918 Other nonspecific abnormal finding of lung field: Secondary | ICD-10-CM | POA: Diagnosis not present

## 2022-07-27 DIAGNOSIS — C9110 Chronic lymphocytic leukemia of B-cell type not having achieved remission: Secondary | ICD-10-CM | POA: Diagnosis not present

## 2022-07-27 DIAGNOSIS — F17210 Nicotine dependence, cigarettes, uncomplicated: Secondary | ICD-10-CM | POA: Insufficient documentation

## 2022-07-27 LAB — CBC WITH DIFFERENTIAL (CANCER CENTER ONLY)
Abs Immature Granulocytes: 0.01 10*3/uL (ref 0.00–0.07)
Basophils Absolute: 0.1 10*3/uL (ref 0.0–0.1)
Basophils Relative: 1 %
Eosinophils Absolute: 0 10*3/uL (ref 0.0–0.5)
Eosinophils Relative: 0 %
HCT: 39.4 % (ref 36.0–46.0)
Hemoglobin: 12.7 g/dL (ref 12.0–15.0)
Immature Granulocytes: 0 %
Lymphocytes Relative: 35 %
Lymphs Abs: 2 10*3/uL (ref 0.7–4.0)
MCH: 29.4 pg (ref 26.0–34.0)
MCHC: 32.2 g/dL (ref 30.0–36.0)
MCV: 91.2 fL (ref 80.0–100.0)
Monocytes Absolute: 1.4 10*3/uL — ABNORMAL HIGH (ref 0.1–1.0)
Monocytes Relative: 25 %
Neutro Abs: 2.2 10*3/uL (ref 1.7–7.7)
Neutrophils Relative %: 39 %
Platelet Count: 141 10*3/uL — ABNORMAL LOW (ref 150–400)
RBC: 4.32 MIL/uL (ref 3.87–5.11)
RDW: 17.5 % — ABNORMAL HIGH (ref 11.5–15.5)
WBC Count: 5.6 10*3/uL (ref 4.0–10.5)
nRBC: 0 % (ref 0.0–0.2)

## 2022-07-27 LAB — CMP (CANCER CENTER ONLY)
ALT: 9 U/L (ref 0–44)
AST: 14 U/L — ABNORMAL LOW (ref 15–41)
Albumin: 4.1 g/dL (ref 3.5–5.0)
Alkaline Phosphatase: 148 U/L — ABNORMAL HIGH (ref 38–126)
Anion gap: 5 (ref 5–15)
BUN: 13 mg/dL (ref 8–23)
CO2: 30 mmol/L (ref 22–32)
Calcium: 9.2 mg/dL (ref 8.9–10.3)
Chloride: 106 mmol/L (ref 98–111)
Creatinine: 1.41 mg/dL — ABNORMAL HIGH (ref 0.44–1.00)
GFR, Estimated: 39 mL/min — ABNORMAL LOW (ref >60)
Glucose, Bld: 130 mg/dL — ABNORMAL HIGH (ref 70–99)
Potassium: 3.3 mmol/L — ABNORMAL LOW (ref 3.5–5.1)
Sodium: 141 mmol/L (ref 135–145)
Total Bilirubin: 0.4 mg/dL (ref 0.3–1.2)
Total Protein: 6.6 g/dL (ref 6.5–8.1)

## 2022-07-27 LAB — LACTATE DEHYDROGENASE: LDH: 138 U/L (ref 98–192)

## 2022-07-28 ENCOUNTER — Telehealth: Payer: Self-pay | Admitting: Hematology

## 2022-07-28 NOTE — Telephone Encounter (Signed)
Scheduled follow-up appointment per 8/1 los. Patient is aware. Mailed calendar.

## 2022-07-29 ENCOUNTER — Other Ambulatory Visit (HOSPITAL_COMMUNITY): Payer: Self-pay

## 2022-08-02 ENCOUNTER — Ambulatory Visit (INDEPENDENT_AMBULATORY_CARE_PROVIDER_SITE_OTHER): Payer: Medicare Other | Admitting: Family Medicine

## 2022-08-02 ENCOUNTER — Encounter: Payer: Self-pay | Admitting: Hematology

## 2022-08-02 ENCOUNTER — Other Ambulatory Visit (HOSPITAL_COMMUNITY): Payer: Self-pay

## 2022-08-02 ENCOUNTER — Encounter: Payer: Self-pay | Admitting: Family Medicine

## 2022-08-02 VITALS — BP 116/57 | HR 77 | Ht 62.0 in | Wt 128.8 lb

## 2022-08-02 DIAGNOSIS — M545 Low back pain, unspecified: Secondary | ICD-10-CM

## 2022-08-02 DIAGNOSIS — G8929 Other chronic pain: Secondary | ICD-10-CM

## 2022-08-02 DIAGNOSIS — R634 Abnormal weight loss: Secondary | ICD-10-CM | POA: Diagnosis not present

## 2022-08-02 NOTE — Progress Notes (Unsigned)
    SUBJECTIVE:   CHIEF COMPLAINT / HPI:   Chronic Back Pain -much improved after resuming Tramadol -requests refills on her Tramadol. States she has 1 tablet left -on chart review, refills sent on July 26th by Dr [name]-- patient doesn't think she received it from [facility]  Weight Loss -14lb weight loss noted on chart review over past ~5 months -patient didn't identify this as a concern, but was asked about it today -states she's unable to eat much (can't chew hard foods) as she is edentulous -getting dentures in January -doesn't like Ensure/Boost but will "think about it" -continues to smoke  PERTINENT  PMH / PSH: tobacco use, COPD, CLL, CKD III, HTN, DM  OBJECTIVE:   BP (!) 116/57   Pulse 77   Ht '5\' 2"'$  (1.575 m)   Wt 128 lb 12.8 oz (58.4 kg)   SpO2 96%   BMI 23.56 kg/m   General: NAD, pleasant, able to participate in exam HEENT: edentulous, moist mucous membranes, oropharynx otherwise unremarkable Respiratory: No respiratory distress Skin: warm and dry, no rashes noted Psych: Normal affect and mood Neuro: grossly intact  ASSESSMENT/PLAN:   Chronic back pain Well-controlled with Tramadol '50mg'$  BID prn. Tramadol refills sent on 07/21/22-- too soon for additional refill. Advised patient to contact her pharmacy to ensure this was filled and delivered.   Weight Loss 14lbs over past 5 months-- presumed secondary to inadequate intake as she is edentulous. Encouraged Ensure or Boost supplementation. Recent labs, CT chest/abdomen/pelvis, and visit with oncologist were all unremarkable. UTD on age-appropriate cancer screening other than mammogram-- patient advised to schedule this.  F/u in 1 month for diabetes visit   Alcus Dad, MD [place]

## 2022-08-02 NOTE — Patient Instructions (Addendum)
It was great to see you!  Call your pharmacy regarding the Tramadol refill [phone]). One was sent on July 26th.  Consider drinking an Ensure or Boost shake daily to help with nutrition.  You are due for a mammogram (screening test for breast cancer). Please call [facility] at [phone] to schedule an appointment. They are located at [place]. After age 75 we usually stop doing mammograms unless there is a concern.  I'm glad you're not using the Flexeril much. Ideally we would discontinue this medication altogether.  See me back at the end of September for a diabetes visit (and your flu shot)  -Dr Rock Nephew

## 2022-08-02 NOTE — Progress Notes (Signed)
HEMATOLOGY/ONCOLOGY CLINIC NOTE  Date of Service: .07/27/2022   Patient Care Team: Alcus Dad, MD as PCP - General (Family Medicine) Sueanne Margarita, MD as PCP - Cardiology (Cardiology) Danna Hefty, DO (Family Medicine) Lazaro Arms, RN as Case Manager  CHIEF COMPLAINTS/PURPOSE OF CONSULTATION:  Follow-up for continued evaluation and management of CLL  HISTORY OF PRESENTING ILLNESS:  Please see previous note for details on initial presentation  INTERVAL HISTORY:  [name] [name] is here for continued evaluation and management of CLL.  She notes no acute new symptoms since her last clinic visit.  No infection issues.  No new lumps or bumps.  No fevers no chills no night sweats no unexpected weight loss. No reported significant toxicities from her current dose of venetoclax. Labs done today were discussed in detail with the patient. CT chest abdomen pelvis done on 06/25/2022 was discussed with the patient.  MEDICAL HISTORY:  Past Medical History:  Diagnosis Date   Allergy    environmental   Asthma    Brain tumor ([place])    Chronic diastolic CHF (congestive heart failure) (HCC)    CKD (chronic kidney disease)    CLL (chronic lymphocytic leukemia) (HCC)    Constipation 11/15/2011   COPD (chronic obstructive pulmonary disease) (HCC)    CVA (cerebral vascular accident) ([place])    Diabetes ([place])    GERD (gastroesophageal reflux disease)    Headache    migraines   Hyperlipidemia    Hypertension    PVD (peripheral vascular disease) ([place])    Stroke ([place]) 1998   in 1998 due to tumor-right side    SURGICAL HISTORY: Past Surgical History:  Procedure Laterality Date   ABDOMINAL AORTOGRAM W/LOWER EXTREMITY Bilateral 08/12/2020   Procedure: ABDOMINAL AORTOGRAM W/BILATERAL LOWER EXTREMITY RUNOFF;  Surgeon: Serafina Mitchell, MD;  Location: [facility];  Service: Cardiovascular;  Laterality: Bilateral;   ABI  02/2012   ABI <0.65 BL 02/2012   BRAIN MENINGIOMA  EXCISION     BRAIN TUMOR EXCISION     Bypass grafting of RLE for PAD claudication      CARDIAC CATHETERIZATION  04/09/208   CESAREAN SECTION     1974, 78 ,80   CHOLECYSTECTOMY, LAPAROSCOPIC     COLONOSCOPY  2014   [place], [place]   ENDARTERECTOMY FEMORAL Right 10/03/2020   Procedure: RIGHT FEMORAL ENDARTERECTOMY WITH VEIN PATCH ANGIOPLASTY;  Surgeon: Serafina Mitchell, MD;  Location: [facility];  Service: Vascular;  Laterality: Right;   ESOPHAGEAL DILATION     FEMORAL-POPLITEAL BYPASS GRAFT Right 10/03/2020   Procedure: RIGHT FEMORAL- BELOW KNEE POPLITEAL BYPASS USING GORE PROPATEN 6MM GRAFT;  Surgeon: Serafina Mitchell, MD;  Location: [facility];  Service: Vascular;  Laterality: Right;   PATCH ANGIOPLASTY  10/03/2020   Procedure: VEIN PATCH ANGIOPLASTY;  Surgeon: Serafina Mitchell, MD;  Location: [facility];  Service: Vascular;;    SOCIAL HISTORY: Social History   Socioeconomic History   Marital status: Single    Spouse name: Not on file   Number of children: 3   Years of education: Not on file   Highest education level: Not on file  Occupational History   Occupation: DISABLED    Employer: DISABLED  Tobacco Use   Smoking status: Every Day    Packs/day: 0.25    Years: 30.00    Total pack years: 7.50    Types: Cigarettes    Start date: 01/01/1976   Smokeless tobacco: Never   Tobacco comments:    ~  3 cigarettes / day  Vaping Use   Vaping Use: Never used  Substance and Sexual Activity   Alcohol use: Not Currently   Drug use: Not Currently   Sexual activity: Yes  Other Topics Concern   Not on file  Social History Narrative   ** Merged History Encounter **       Health Care POA:  Emergency Contact: Val Riles [phone] (c) End of Life Plan:  Who lives with you: Lives with husband Any pets: none Diet: Patient lacks financial resources for much food. Pt reports eating what is available. Exercise: Patient    does not have an exercise plan. Seatbelts: Patient reports wearing seatbelt  when in vehicle.  [name] Exposure/Protection: Hobbies: Bowling, computer games, Bingo  Has financial difficulties and transportation issues as she and her husband share transpo   rtation      Social Determinants of Health   Financial Resource Strain: High Risk (01/13/2022)   Overall Financial Resource Strain (CARDIA)    Difficulty of Paying Living Expenses: Hard  Food Insecurity: Not on file  Transportation Needs: Not on file  Physical Activity: Not on file  Stress: Not on file  Social Connections: Not on file  Intimate Partner Violence: Not on file    FAMILY HISTORY: Family History  Problem Relation Age of Onset   Heart disease Mother    Asthma Mother    Cancer Mother        uterine    Depression Mother    Heart attack Mother 39   Hyperlipidemia Mother    Hypertension Mother    Stroke Mother    Kidney disease Mother    Heart attack Sister    Stroke Sister    Depression Sister    Diabetes Sister    Hyperlipidemia Sister    Depression Sister    Diabetes Sister    Hyperlipidemia Sister    HIV/AIDS Sister    Diabetes Brother    Asthma Brother    Hypertension Brother    Cancer Maternal Aunt        lung   Heart disease Maternal Grandmother    Heart attack Maternal Grandmother    Cancer Brother        colon   HIV/AIDS Brother    Colon cancer Neg Hx     ALLERGIES:  is allergic to gazyva [obinutuzumab], aspirin, no healthtouch food allergies, aspirin, and baclofen.  MEDICATIONS:  Current Outpatient Medications  Medication Sig Dispense Refill   acetaminophen (TYLENOL) 500 MG tablet Take 500-1,000 mg by mouth every 6 (six) hours as needed for moderate pain.     albuterol (PROVENTIL) (2.5 MG/3ML) 0.083% nebulizer solution Inhale 1 vial via nebulizer every 6 hours for wheezing or shortness of breath as needed 75 mL 11   albuterol (VENTOLIN HFA) 108 (90 Base) MCG/ACT inhaler Inhale 2 puffs by mouth every 6 hours as needed for wheezing or shortness of breath 8.5 each 11    Alcohol Swabs (ALCOHOL PADS) 70 % PADS      amLODipine (NORVASC) 10 MG tablet Take 1 tablet by mouth every day 30 tablet 11   atorvastatin (LIPITOR) 80 MG tablet Take 1 tablet (80 mg total) by mouth daily. 90 tablet 3   Blood Glucose Calibration (OT ULTRA/FASTTK CNTRL SOLN) SOLN See admin instructions.     blood glucose meter kit and supplies KIT Dispense based on patient and insurance preference. Use up to four times daily as directed. 1 each 0   Blood Glucose Monitoring  Suppl (ACCU-CHEK GUIDE ME) w/Device KIT Use to test blood sugar twice per day. E11.9 1 kit 1   clopidogrel (PLAVIX) 75 MG tablet Take 1 tablet by mouth every day 30 tablet 0   cyclobenzaprine (FLEXERIL) 5 MG tablet Take 1 tablet (5 mg total) by mouth 2 (two) times daily as needed for muscle spasms. 30 tablet 0   DULoxetine (CYMBALTA) 30 MG capsule Take 1 capsule by mouth every day 30 capsule 11   famotidine (PEPCID) 20 MG tablet Take 1 tablet by mouth twice daily 60 tablet 11   fluticasone furoate-vilanterol (BREO ELLIPTA) 100-25 MCG/ACT AEPB Inhale 1 puff by mouth every day 60 each 11   furosemide (LASIX) 20 MG tablet Take 1 tablet (20 mg total) by mouth daily. 90 tablet 3   gabapentin (NEURONTIN) 300 MG capsule Take 2 capsules by mouth twice daily 120 capsule 0   glucose blood (ONETOUCH VERIO) test strip Use to check blood sugar once a day 100 each 12   hydrALAZINE (APRESOLINE) 25 MG tablet Take 1 tablet (25 mg total) by mouth 2 (two) times daily. 180 tablet 3   iron polysaccharides (NIFEREX) 150 MG capsule Take 1 capsule (150 mg total) by mouth daily. 30 capsule 5   JARDIANCE 25 MG TABS tablet Take 1 tablet by mouth every day 30 tablet 11   Lancet Devices (EASY MINI EJECT LANCING DEVICE) MISC daily.     metFORMIN (GLUCOPHAGE) 500 MG tablet Take 1 tablet by mouth every day 30 tablet 11   montelukast (SINGULAIR) 10 MG tablet Take 1 tablet (10 mg total) by mouth at bedtime. 30 tablet 11   nitroGLYCERIN (NITROSTAT) 0.4 MG SL  tablet Please take as prescribed. 25 tablet 11   OneTouch Delica Lancets 76L MISC Use to check blood sugars 2x per day. E11.9 100 each 0   traMADol (ULTRAM) 50 MG tablet Take 1 tablet (50 mg total) by mouth every 12 (twelve) hours as needed for severe pain. 60 tablet 0   venetoclax (VENCLEXTA) 100 MG tablet Take 2 tablets (200 mg total) by mouth daily. Tablets should be swallowed whole with a meal and a full glass of water. 60 tablet 2   No current facility-administered medications for this visit.    REVIEW OF SYSTEMS:   10 Point review of Systems was done is negative except as noted above.  PHYSICAL EXAMINATION: ECOG FS:2 - Symptomatic, <50% confined to bed  Vitals:   07/27/22 1311  BP: 124/62  Pulse: 66  Resp: 17  Temp: 97.9 F (36.6 C)  SpO2: 96%    Wt Readings from Last 3 Encounters:  08/02/22 128 lb 12.8 oz (58.4 kg)  07/27/22 132 lb 11.2 oz (60.2 kg)  05/31/22 129 lb 12.8 oz (58.9 kg)   Body mass index is 24.27 kg/m.   NAD GENERAL:alert, in no acute distress and comfortable SKIN: no acute rashes, no significant lesions EYES: conjunctiva are pink and non-injected, sclera anicteric OROPHARYNX: MMM, no exudates, no oropharyngeal erythema or ulceration NECK: supple, no JVD LYMPH:  no palpable lymphadenopathy in the cervical, axillary or inguinal regions LUNGS: clear to auscultation b/l with normal respiratory effort HEART: regular rate & rhythm ABDOMEN:  normoactive bowel sounds , non tender, not distended. Extremity: no pedal edema PSYCH: alert & oriented x 3 with fluent speech NEURO: no focal motor/sensory deficits   LABORATORY DATA:  I have reviewed the data as listed      Latest Ref Rng & Units 07/27/2022   12:19 PM 05/05/2022  11:01 AM 01/04/2022    2:54 PM  CBC  WBC 4.0 - 10.5 K/uL 5.6  7.6  6.4   Hemoglobin 12.0 - 15.0 g/dL 12.7  12.8  12.6   Hematocrit 36.0 - 46.0 % 39.4  40.8  38.7   Platelets 150 - 400 K/uL 141  173  180        Latest Ref Rng &  Units 07/27/2022   12:19 PM 06/25/2022    9:17 AM 05/05/2022   11:01 AM  CMP  Glucose 70 - 99 mg/dL 130   150   BUN 8 - 23 mg/dL 13   10   Creatinine 0.44 - 1.00 mg/dL 1.41  1.40  1.28   Sodium 135 - 145 mmol/L 141   140   Potassium 3.5 - 5.1 mmol/L 3.3   3.6   Chloride 98 - 111 mmol/L 106   106   CO2 22 - 32 mmol/L 30   28   Calcium 8.9 - 10.3 mg/dL 9.2   9.2   Total Protein 6.5 - 8.1 g/dL 6.6   6.8   Total Bilirubin 0.3 - 1.2 mg/dL 0.4   0.5   Alkaline Phos 38 - 126 U/L 148   173   AST 15 - 41 U/L 14   18   ALT 0 - 44 U/L 9   16     Lab Results  Component Value Date   LDH 138 07/27/2022         . Lab Results  Component Value Date   LDH 138 07/27/2022    RADIOGRAPHIC STUDIES: I have personally reviewed the radiological images as listed and agreed with the findings in the report. No results found.  Surgical Pathology 12/13/2017   ASSESSMENT & PLAN:   NARCISSA MELDER is a wonderful 76 y.o. female with    1. Rai 1 Chronic Lymphocytic Leukemia  - Trisomy 12 mutation. No associated thrombocytopenia.  Mild anemia - likely from acute blood loss from significant epistaxis.  Now resolved No constitutional symptoms. Minimal LNaednopathy No splenomegaly  #2 .Lung nodules on CT in 12/2017. Rpt CT chest 08/09/2018 - stable  #3 Patient Active Problem List   Diagnosis Date Noted   Neck pain 04/29/2022   Polypharmacy 04/07/2022   Right hand pain 01/01/2022   History of home oxygen therapy 11/06/2021   Sepsis ([place]) 07/23/2021   Iron deficiency anemia 02/12/2021   Peripheral arterial disease ([place]) 10/03/2020   (HFpEF) heart failure with preserved ejection fraction ([place]) 06/11/2020   Venous insufficiency (chronic) (peripheral) 06/22/2019   Pulmonary nodules 01/04/2018   Mass of left lower leg 01/04/2018   Chronic kidney disease (CKD), stage III (moderate) ([name]) 06/04/2016   Chronic back pain 10/02/2015   DM (diabetes mellitus), type 2 with peripheral vascular  complications ([place]) 81/19/1478   Chronic lymphocytic leukemia ([place]) 04/11/2009   PERIPHERAL VASCULAR DISEASE 10/13/2007   HYPERCHOLESTEROLEMIA 02/23/2007   TOBACCO DEPENDENCE 02/23/2007   Essential hypertension 02/23/2007   COPD (chronic obstructive pulmonary disease) ([place]) 02/23/2007   GASTROESOPHAGEAL REFLUX, NO ESOPHAGITIS 02/23/2007    PLAN: -Patient has no new clinical signs or symptoms of CLL progression  -Labs done today show stable CBC and CMP and normal LDH -CT chest abdomen pelvis done on 06/25/2022 show no notable lymphadenopathy in the chest abdomen or pelvis.  No acute findings in the abdomen and pelvis.  Stable emphysematous changes. -She has completed 1 year of venetoclax and has no significant evidence of persistent/recurrent CLL at this time. -  She will complete her current month of venetoclax and then will stop venetoclax. -Continue active surveillance of her CLL at this time Continue follow-up with PCP for management of other medical comorbidities Counseled on maintaining good hydration of at least 2 L of water per day  FOLLOW UP: Return to clinic with Dr. Irene Limbo with labs in 6 months  The total time spent in the appointment was 25 minutes*.  All of the patient's questions were answered with apparent satisfaction. The patient knows to call the clinic with any problems, questions or concerns.   Sullivan Lone MD MS AAHIVMS [facility] Hematology/Oncology Physician [facility]  .*Total Encounter Time as defined by the Centers for Medicare and Medicaid Services includes, in addition to the face-to-face time of a patient visit (documented in the note above) non-face-to-face time: obtaining and reviewing outside history, ordering and reviewing medications, tests or procedures, care coordination (communications with other health care professionals or caregivers) and documentation in the medical record.

## 2022-08-03 NOTE — Assessment & Plan Note (Addendum)
Well-controlled with Tramadol '50mg'$  BID prn. Tramadol refills sent on 07/21/22-- too soon for additional refill. Advised patient to contact her pharmacy to ensure this was filled and delivered.

## 2022-08-04 ENCOUNTER — Other Ambulatory Visit (HOSPITAL_COMMUNITY): Payer: Self-pay

## 2022-08-11 ENCOUNTER — Telehealth: Payer: Self-pay | Admitting: Family Medicine

## 2022-08-11 DIAGNOSIS — G8929 Other chronic pain: Secondary | ICD-10-CM

## 2022-08-17 MED ORDER — TRAMADOL HCL 50 MG PO TABS: 50.0000 mg | ORAL_TABLET | Freq: Two times a day (BID) | ORAL | 0 refills | Status: DC | PRN

## 2022-08-17 NOTE — Addendum Note (Signed)
Addended by: Alcus Dad on: 08/17/2022 07:31 PM   Modules accepted: Orders

## 2022-08-17 NOTE — Telephone Encounter (Signed)
Pharmacy calls nurse line in regards to Tramadol refill.   Due to [facility] being a mail order pharmacy they are needing prescription approved sooner than fill date.   Will forward to PCP.

## 2022-08-17 NOTE — Addendum Note (Signed)
Addended by: Dorna Bloom on: 08/17/2022 02:54 PM   Modules accepted: Orders

## 2022-08-17 NOTE — Telephone Encounter (Signed)
Noted. Tramadol refill approved.

## 2022-08-22 ENCOUNTER — Other Ambulatory Visit: Payer: Self-pay | Admitting: Cardiology

## 2022-08-22 DIAGNOSIS — I10 Essential (primary) hypertension: Secondary | ICD-10-CM

## 2022-08-26 ENCOUNTER — Ambulatory Visit: Payer: Medicare Other | Admitting: Cardiology

## 2022-08-30 ENCOUNTER — Emergency Department (HOSPITAL_COMMUNITY)
Admission: EM | Admit: 2022-08-30 | Discharge: 2022-08-31 | Disposition: A | Discharge status: Home / Self Care | Payer: Medicare Other | Attending: Emergency Medicine | Admitting: Emergency Medicine

## 2022-08-30 ENCOUNTER — Emergency Department (HOSPITAL_COMMUNITY): Payer: Medicare Other

## 2022-08-30 ENCOUNTER — Other Ambulatory Visit: Payer: Self-pay

## 2022-08-30 ENCOUNTER — Encounter (HOSPITAL_COMMUNITY): Payer: Self-pay | Admitting: Emergency Medicine

## 2022-08-30 DIAGNOSIS — Y92000 Kitchen of unspecified non-institutional (private) residence as  the place of occurrence of the external cause: Secondary | ICD-10-CM | POA: Insufficient documentation

## 2022-08-30 DIAGNOSIS — I700 Atherosclerosis of aorta: Secondary | ICD-10-CM | POA: Insufficient documentation

## 2022-08-30 DIAGNOSIS — E785 Hyperlipidemia, unspecified: Secondary | ICD-10-CM | POA: Insufficient documentation

## 2022-08-30 DIAGNOSIS — R918 Other nonspecific abnormal finding of lung field: Secondary | ICD-10-CM | POA: Insufficient documentation

## 2022-08-30 DIAGNOSIS — I5032 Chronic diastolic (congestive) heart failure: Secondary | ICD-10-CM | POA: Insufficient documentation

## 2022-08-30 DIAGNOSIS — I6930 Unspecified sequelae of cerebral infarction: Secondary | ICD-10-CM | POA: Diagnosis not present

## 2022-08-30 DIAGNOSIS — M5136 Other intervertebral disc degeneration, lumbar region: Secondary | ICD-10-CM | POA: Diagnosis not present

## 2022-08-30 DIAGNOSIS — E119 Type 2 diabetes mellitus without complications: Secondary | ICD-10-CM | POA: Insufficient documentation

## 2022-08-30 DIAGNOSIS — Z79899 Other long term (current) drug therapy: Secondary | ICD-10-CM | POA: Diagnosis not present

## 2022-08-30 DIAGNOSIS — W010XXA Fall on same level from slipping, tripping and stumbling without subsequent striking against object, initial encounter: Secondary | ICD-10-CM | POA: Diagnosis not present

## 2022-08-30 DIAGNOSIS — Z85841 Personal history of malignant neoplasm of brain: Secondary | ICD-10-CM | POA: Insufficient documentation

## 2022-08-30 DIAGNOSIS — Z7902 Long term (current) use of antithrombotics/antiplatelets: Secondary | ICD-10-CM | POA: Insufficient documentation

## 2022-08-30 DIAGNOSIS — Z7984 Long term (current) use of oral hypoglycemic drugs: Secondary | ICD-10-CM | POA: Insufficient documentation

## 2022-08-30 DIAGNOSIS — J449 Chronic obstructive pulmonary disease, unspecified: Secondary | ICD-10-CM | POA: Insufficient documentation

## 2022-08-30 DIAGNOSIS — J45909 Unspecified asthma, uncomplicated: Secondary | ICD-10-CM | POA: Diagnosis not present

## 2022-08-30 DIAGNOSIS — S0990XA Unspecified injury of head, initial encounter: Secondary | ICD-10-CM | POA: Insufficient documentation

## 2022-08-30 DIAGNOSIS — W19XXXA Unspecified fall, initial encounter: Secondary | ICD-10-CM

## 2022-08-30 DIAGNOSIS — I130 Hypertensive heart and chronic kidney disease with heart failure and stage 1 through stage 4 chronic kidney disease, or unspecified chronic kidney disease: Secondary | ICD-10-CM | POA: Diagnosis not present

## 2022-08-30 DIAGNOSIS — M48061 Spinal stenosis, lumbar region without neurogenic claudication: Secondary | ICD-10-CM | POA: Insufficient documentation

## 2022-08-30 DIAGNOSIS — Q2579 Other congenital malformations of pulmonary artery: Secondary | ICD-10-CM | POA: Insufficient documentation

## 2022-08-30 DIAGNOSIS — Z856 Personal history of leukemia: Secondary | ICD-10-CM | POA: Insufficient documentation

## 2022-08-30 DIAGNOSIS — N189 Chronic kidney disease, unspecified: Secondary | ICD-10-CM | POA: Insufficient documentation

## 2022-08-30 DIAGNOSIS — R600 Localized edema: Secondary | ICD-10-CM | POA: Diagnosis not present

## 2022-08-30 DIAGNOSIS — M542 Cervicalgia: Secondary | ICD-10-CM | POA: Insufficient documentation

## 2022-08-30 LAB — BASIC METABOLIC PANEL
Anion gap: 11 (ref 5–15)
BUN: 10 mg/dL (ref 8–23)
CO2: 27 mmol/L (ref 22–32)
Calcium: 9.7 mg/dL (ref 8.9–10.3)
Chloride: 101 mmol/L (ref 98–111)
Creatinine, Ser: 1.39 mg/dL — ABNORMAL HIGH (ref 0.44–1.00)
GFR, Estimated: 40 mL/min — ABNORMAL LOW (ref >60)
Glucose, Bld: 135 mg/dL — ABNORMAL HIGH (ref 70–99)
Potassium: 3.7 mmol/L (ref 3.5–5.1)
Sodium: 139 mmol/L (ref 135–145)

## 2022-08-30 LAB — I-STAT CHEM 8, ED
BUN: 10 mg/dL (ref 8–23)
Calcium, Ion: 1.17 mmol/L (ref 1.15–1.40)
Chloride: 100 mmol/L (ref 98–111)
Creatinine, Ser: 1.4 mg/dL — ABNORMAL HIGH (ref 0.44–1.00)
Glucose, Bld: 134 mg/dL — ABNORMAL HIGH (ref 70–99)
HCT: 45 % (ref 36.0–46.0)
Hemoglobin: 15.3 g/dL — ABNORMAL HIGH (ref 12.0–15.0)
Potassium: 3.6 mmol/L (ref 3.5–5.1)
Sodium: 138 mmol/L (ref 135–145)
TCO2: 29 mmol/L (ref 22–32)

## 2022-08-30 MED ORDER — FENTANYL CITRATE PF 50 MCG/ML IJ SOSY
50.0000 ug | PREFILLED_SYRINGE | Freq: Once | INTRAMUSCULAR | Status: AC
  Administered 2022-08-30: 50 ug via INTRAVENOUS
  Filled 2022-08-30: qty 1

## 2022-08-30 MED ORDER — ONDANSETRON HCL 4 MG/2ML IJ SOLN
4.0000 mg | Freq: Once | INTRAMUSCULAR | Status: AC
  Administered 2022-08-30: 4 mg via INTRAVENOUS
  Filled 2022-08-30: qty 2

## 2022-08-30 MED ORDER — IOHEXOL 300 MG/ML  SOLN
100.0000 mL | Freq: Once | INTRAMUSCULAR | Status: AC | PRN
Start: 2022-08-30 — End: 2022-08-30
  Administered 2022-08-30: 100 mL via INTRAVENOUS

## 2022-08-30 MED ORDER — FENTANYL CITRATE PF 50 MCG/ML IJ SOSY
25.0000 ug | PREFILLED_SYRINGE | Freq: Once | INTRAMUSCULAR | Status: AC
  Administered 2022-08-30: 25 ug via INTRAVENOUS
  Filled 2022-08-30: qty 1

## 2022-08-30 NOTE — ED Provider Notes (Addendum)
[facility] EMERGENCY DEPARTMENT Provider Note   CSN: [id number] Arrival date & time: 08/30/22  2003     History  Chief Complaint  Patient presents with   [name] [name] is a 76 y.o. female.  76 year old female presents as a level 2 trauma for fall on thinners.  Patient was walking into the kitchen when she tripped and fell forward.  She is complaining of headache, neck pain.  She is on Plavix.  No other anticoagulation.  Denies loss of consciousness.  Denies chest pain, shortness of breath, or palpitation surrounding this episode.  Patient fell onto vinyl flooring. Additional history obtained.  Patient does have history of COPD and has supplemental O2 at home as needed.  Does not routinely use this.  Reports she fell because her right leg gave out on her.  States this has been an ongoing issue over the past 2 years.  She also reports worsening low back pain over the past couple months.  Recently started on tramadol.  She does have home PT established.  Patient lives with husband who is at bedside.  She does have history of CVA with right-sided deficits.   The history is provided by the patient. No language interpreter was used.       Home Medications Prior to Admission medications   Medication Sig Start Date End Date Taking? Authorizing Provider  acetaminophen (TYLENOL) 500 MG tablet Take 500-1,000 mg by mouth every 6 (six) hours as needed for moderate pain.    [provider]  albuterol (PROVENTIL) (2.5 MG/3ML) 0.083% nebulizer solution Inhale 1 vial via nebulizer every 6 hours for wheezing or shortness of breath as needed 03/23/22   Zola Button, MD  albuterol (VENTOLIN HFA) 108 (90 Base) MCG/ACT inhaler Inhale 2 puffs by mouth every 6 hours as needed for wheezing or shortness of breath 07/12/22   Alcus Dad, MD  Alcohol Swabs (ALCOHOL PADS) 70 % PADS  11/27/20   [provider]  amLODipine (NORVASC) 10 MG tablet Take 1 tablet by mouth  every day 07/12/22   Sueanne Margarita, MD  atorvastatin (LIPITOR) 80 MG tablet Take 1 tablet (80 mg total) by mouth daily. PLEASE CONTACT THE OFFICE FOR ADDITIONAL REFILLS 08/24/22   Sueanne Margarita, MD  Blood Glucose Calibration (OT ULTRA/FASTTK CNTRL SOLN) SOLN See admin instructions. 11/27/20   [provider]  blood glucose meter kit and supplies KIT Dispense based on patient and insurance preference. Use up to four times daily as directed. 05/31/22   Alcus Dad, MD  Blood Glucose Monitoring Suppl (ACCU-CHEK GUIDE ME) w/Device KIT Use to test blood sugar twice per day. E11.9 06/03/22   Alcus Dad, MD  clopidogrel (PLAVIX) 75 MG tablet Take 1 tablet by mouth every day 07/23/22   Zola Button, MD  cyclobenzaprine (FLEXERIL) 5 MG tablet Take 1 tablet (5 mg total) by mouth 2 (two) times daily as needed for muscle spasms. 07/21/22   Zola Button, MD  DULoxetine (CYMBALTA) 30 MG capsule Take 1 capsule by mouth every day 07/12/22   Alcus Dad, MD  famotidine (PEPCID) 20 MG tablet Take 1 tablet by mouth twice daily 03/23/22   Zola Button, MD  fluticasone furoate-vilanterol (BREO ELLIPTA) 100-25 MCG/ACT AEPB Inhale 1 puff by mouth every day 03/23/22   Zola Button, MD  furosemide (LASIX) 20 MG tablet Take 1 tablet (20 mg total) by mouth daily. PLEASE CONTACT THE OFFICE FOR ADDITIONAL REFILLS 08/24/22   Sueanne Margarita,  MD  gabapentin (NEURONTIN) 300 MG capsule Take 2 capsules by mouth twice daily 06/28/22   Lovorn, Jinny Blossom, MD  glucose blood (ONETOUCH VERIO) test strip Use to check blood sugar once a day 01/22/22   Dickie La, MD  hydrALAZINE (APRESOLINE) 25 MG tablet Take 1 tablet (25 mg total) by mouth 2 (two) times daily. PLEASE CONTACT THE OFFICE FOR ADDITIONAL REFILLS 08/24/22   Sueanne Margarita, MD  iron polysaccharides (NIFEREX) 150 MG capsule Take 1 capsule (150 mg total) by mouth daily. 12/05/20   Brunetta Genera, MD  JARDIANCE 25 MG TABS tablet Take 1 tablet by mouth every day 11/17/21    Alcus Dad, MD  Lancet Devices (EASY MINI EJECT LANCING DEVICE) MISC daily. 11/27/20   [provider]  metFORMIN (GLUCOPHAGE) 500 MG tablet Take 1 tablet by mouth every day 11/17/21   Alcus Dad, MD  montelukast (SINGULAIR) 10 MG tablet Take 1 tablet (10 mg total) by mouth at bedtime. 01/13/22   Alcus Dad, MD  nitroGLYCERIN (NITROSTAT) 0.4 MG SL tablet Please take as prescribed. 03/05/22   Donney Dice, DO  OneTouch Delica Lancets 89F MISC Use to check blood sugars 2x per day. E11.9 06/25/22   Alcus Dad, MD  traMADol (ULTRAM) 50 MG tablet Take 1 tablet (50 mg total) by mouth every 12 (twelve) hours as needed for severe pain. 08/17/22   Alcus Dad, MD  venetoclax (VENCLEXTA) 100 MG tablet Take 2 tablets (200 mg total) by mouth daily. Tablets should be swallowed whole with a meal and a full glass of water. 04/09/22   Brunetta Genera, MD  prochlorperazine (COMPAZINE) 10 MG tablet Take 1 tablet (10 mg total) by mouth every 6 (six) hours as needed (Nausea or vomiting). 02/19/21 04/21/21  Brunetta Genera, MD      Allergies    Dyann Kief [obinutuzumab], Aspirin, No healthtouch food allergies, Aspirin, and Baclofen    Review of Systems   Review of Systems  Constitutional:  Negative for chills and fever.  Respiratory:  Negative for shortness of breath.   Cardiovascular:  Negative for chest pain.  Gastrointestinal:  Negative for abdominal pain.  Musculoskeletal:  Positive for back pain.  Neurological:  Positive for headaches. Negative for syncope and weakness.  All other systems reviewed and are negative.   Physical Exam Updated Vital Signs BP 137/83   Pulse 81   Temp 98.1 F (36.7 C) (Oral)   Resp (!) 21   Ht _0  (1.575 m)   Wt 62.6 kg   SpO2 93%   BMI 25.24 kg/m  Physical Exam Vitals and nursing note reviewed.  Constitutional:      General: She is not in acute distress.    Appearance: Normal appearance. She is not ill-appearing.  HENT:      Head: Normocephalic and atraumatic.     Nose: Nose normal.  Eyes:     General: No scleral icterus.    Extraocular Movements: Extraocular movements intact.     Conjunctiva/sclera: Conjunctivae normal.  Cardiovascular:     Rate and Rhythm: Normal rate and regular rhythm.     Pulses: Normal pulses.     Comments: Generalized chest wall tenderness. Pulmonary:     Effort: Pulmonary effort is normal. No respiratory distress.     Breath sounds: Normal breath sounds. No wheezing or rales.  Abdominal:     General: There is distension.     Palpations: Abdomen is soft.     Tenderness: There is abdominal tenderness. There  is no guarding.  Musculoskeletal:        General: Normal range of motion.     Cervical back: Normal range of motion.     Right lower leg: Edema present.     Left lower leg: No edema.     Comments: Cervical spine, thoracic spine without tenderness palpation.  Lumbar spine with significant tenderness to palpation.  Bilateral hips without tenderness to palpation.  2+ pitting edema present and right lower extremity.  Good range of motion with plantarflexion, dorsiflexion, knee extension and flexion of the right lower extremity however limited range of motion with hip flexion.  4/5 strength on right lower extremity and right upper extremity.  Without pronator drift.  All major joints without tenderness to palpation.  Skin:    General: Skin is warm and dry.  Neurological:     General: No focal deficit present.     Mental Status: She is alert. Mental status is at baseline.     ED Results / Procedures / Treatments   Labs (all labs ordered are listed, but only abnormal results are displayed) Labs Reviewed  BASIC METABOLIC PANEL - Abnormal; Notable for the following components:      Result Value   Glucose, Bld 135 (*)    Creatinine, Ser 1.39 (*)    GFR, Estimated 40 (*)    All other components within normal limits  I-STAT CHEM 8, ED - Abnormal; Notable for the following  components:   Creatinine, Ser 1.40 (*)    Glucose, Bld 134 (*)    Hemoglobin 15.3 (*)    All other components within normal limits  CBC    EKG None  Radiology DG Pelvis Portable  Result Date: 08/30/2022 CLINICAL DATA:  Trauma, fall. EXAM: PORTABLE PELVIS 1-2 VIEWS COMPARISON:  None Available. FINDINGS: The cortical margins of the bony pelvis are intact. No fracture. Pubic symphysis and sacroiliac joints are congruent. Both femoral heads are well-seated in the respective acetabula. Iliac stents as well as coil embolization on the left. Right femoral stent partially included. IMPRESSION: No pelvic fracture. Electronically Signed   By: Keith Rake M.D.   On: 08/30/2022 20:33   DG Chest Portable 1 View  Result Date: 08/30/2022 CLINICAL DATA:  Fall EXAM: PORTABLE CHEST 1 VIEW COMPARISON:  08/17/2021 FINDINGS: The heart size and mediastinal contours are within normal limits. Both lungs are clear. The visualized skeletal structures are unremarkable. IMPRESSION: No acute abnormality of the lungs in AP portable projection. Electronically Signed   By: Delanna Ahmadi M.D.   On: 08/30/2022 20:32    Procedures Procedures    Medications Ordered in ED Medications  fentaNYL (SUBLIMAZE) injection 25 mcg (25 mcg Intravenous Given 08/30/22 2036)  ondansetron (ZOFRAN) injection 4 mg (4 mg Intravenous Given 08/30/22 2036)  iohexol (OMNIPAQUE) 300 MG/ML solution 100 mL (100 mLs Intravenous Contrast Given 08/30/22 2138)    ED Course/ Medical Decision Making/ A&P                           Medical Decision Making Amount and/or Complexity of Data Reviewed Labs: ordered. Radiology: ordered.  Risk Prescription drug management.   Medical Decision Making / ED Course   This patient presents to the ED for concern of fall, this involves an extensive number of treatment options, and is a complaint that carries with it a high risk of complications and morbidity.  The differential diagnosis includes acute  intracranial bleed, cervical spine, or other  spinal fracture, intra-abdominal or thoracic injury.  MDM: 76 year old female presents as a level 2 trauma as follow thinners.  She only takes Plavix.  No other anticoagulation.  Complaining of headache and neck pain.  Denies loss of consciousness.  On exam she does have generalized abdominal tenderness with guarding.  She states this is new since the fall.  She also reports low back pain.  However she has history of chronic low back pain and states it is worse since the fall.  Will obtain i-STAT CHEM.  Will obtain CTA chest, abdomen, pelvis, head CT, cervical spine CT thoracic and lumbar spine CT.  Will obtain chest x-ray, and pelvis plain films.  Mild renal insufficiency on i-STAT CHEM panel.  Work-up with imaging overall reassuring with the exception of CT cervical spine which shows erosion of C3-C4 with surrounding edema which is new since last imaging.  Discussed with attending Dr. Berkley Harvey.  We will proceed with MRI of cervical spine.  Given the weakness in the right lower extremity because of the fall we will add on MRI brain to rule out acute CVA.  Given the worsening low back pain and significant tenderness to palpation we will add on MRI lumbar and thoracic spine.  Patient at the end of my shift is awaiting MRI imaging.  Signed out to [employer].  Plan discussed with patient who voices understanding and is in agreement with plan.  Lab Tests: -I ordered, reviewed, and interpreted labs.   The pertinent results include:   Labs Reviewed  BASIC METABOLIC PANEL - Abnormal; Notable for the following components:      Result Value   Glucose, Bld 135 (*)    Creatinine, Ser 1.39 (*)    GFR, Estimated 40 (*)    All other components within normal limits  I-STAT CHEM 8, ED - Abnormal; Notable for the following components:   Creatinine, Ser 1.40 (*)    Glucose, Bld 134 (*)    Hemoglobin 15.3 (*)    All other components within normal limits  CBC       EKG  EKG Interpretation  Date/Time:    Ventricular Rate:    PR Interval:    QRS Duration:   QT Interval:    QTC Calculation:   R Axis:     Text Interpretation:           Imaging Studies ordered: I ordered imaging studies including CT head, CT cervical spine, CT thoracic, CT lumbar, CT chest abdomen pelvis, chest x-ray, pelvic x-ray.  Additional imaging ordered but not resulted at the end of my shift MRI brain, MRI cervical spine, MRI thoracic, MRI lumbar I independently visualized and interpreted imaging. I agree with the radiologist interpretation   Medicines ordered and prescription drug management: Meds ordered this encounter  Medications   fentaNYL (SUBLIMAZE) injection 25 mcg   ondansetron (ZOFRAN) injection 4 mg   iohexol (OMNIPAQUE) 300 MG/ML solution 100 mL    -I have reviewed the patients home medicines and have made adjustments as needed  Reevaluation: After the interventions noted above, I reevaluated the patient and found that they have :improved  Co morbidities that complicate the patient evaluation  Past Medical History:  Diagnosis Date   Allergy    environmental   Asthma    Brain tumor ([place])    Chronic diastolic CHF (congestive heart failure) (H[facility])    CKD (chronic kidney disease)    CLL (chronic lymphocytic leukemia) ([place])    Constipation 11/15/2011   COPD (  chronic obstructive pulmonary disease) (HCC)    CVA (cerebral vascular accident) ([place])    Diabetes ([place])    GERD (gastroesophageal reflux disease)    Headache    migraines   Hyperlipidemia    Hypertension    PVD (peripheral vascular disease) ([place])    Stroke ([place]) 1998   in 1998 due to tumor-right side      Dispostion:  Patient at the end my shift is awaiting MRI imaging.  Patient signed out to Harle Stanford to follow-up on imaging and disposition.  Final Clinical Impression(s) / ED Diagnoses Final diagnoses:  Fall, initial encounter  Head injury    Rx / DC Orders ED  Discharge Orders     None          Evlyn Courier, PA-C 08/30/22 2357    Regan Lemming, MD 08/31/22 0120

## 2022-08-30 NOTE — ED Triage Notes (Signed)
Pt BIB from home by [facility] for fall on thinners, pt takes Plavix, pt reports falling from a standing position in her kitchen, fall to linoleum flooring; pt c/o headache, neck pain, lower back, chest and abd, hematoma to right forehead noted, pt a&o during triage with c-collar in place, EDP at bedside

## 2022-08-30 NOTE — Progress Notes (Signed)
Chaplain Medinas-Lockley responding to page for pt [name] [name], initial visit for level 2 trauma resulting from fall on thinners. Pt unavailable at time of visit and no family present at this time.  Chaplain services remain available for follow-up spiritual/emotional support as needed.  [place], [place]     08/30/22 2000  Clinical Encounter Type  Visited With Patient not available  Visit Type Initial;ED;Trauma

## 2022-08-30 NOTE — ED Notes (Addendum)
Per pt ok to give updates to children/siblings/husband/grandchildren. Please speak loudly to husband, he does not have his hearing aids.

## 2022-08-31 ENCOUNTER — Emergency Department (HOSPITAL_COMMUNITY): Payer: Medicare Other

## 2022-08-31 LAB — CBC
HCT: 40.7 % (ref 36.0–46.0)
Hemoglobin: 12.7 g/dL (ref 12.0–15.0)
MCH: 29.3 pg (ref 26.0–34.0)
MCHC: 31.2 g/dL (ref 30.0–36.0)
MCV: 94 fL (ref 80.0–100.0)
Platelets: 146 10*3/uL — ABNORMAL LOW (ref 150–400)
RBC: 4.33 MIL/uL (ref 3.87–5.11)
RDW: 17.8 % — ABNORMAL HIGH (ref 11.5–15.5)
WBC: 7.5 10*3/uL (ref 4.0–10.5)
nRBC: 0 % (ref 0.0–0.2)

## 2022-08-31 MED ORDER — GADOBUTROL 1 MMOL/ML IV SOLN
6.0000 mL | Freq: Once | INTRAVENOUS | Status: AC | PRN
  Administered 2022-08-31: 6 mL via INTRAVENOUS

## 2022-08-31 NOTE — ED Provider Notes (Signed)
Patient was received at shift change from Evlyn Courier, PA-C please see note for full detail  In short patient with medical history including hypertension, hyperlipidemia, stroke with right-sided deficits diabetes, CLL CKD presents  with complaints of a fall, she had a mechanical fall yesterday, she is walking with a walker her right leg gave out question fall, she states that her right leg generally gives out, she states she is having some lower back pain, without any other complaints.  Trauma scans were obtained, all were unremarkable except for the CT C-spine which shows erosion along the C3-C4 with soft tissue edema, this has progressed from previous imaging, concern for possible infection/septic arthritis due to this finding MRI was ordered for C-spine for further evaluation, due to the weakness in the leg causing her fall MRI brain as well as continuing pain in her T-spine and lumbar spine MRI was ordered of the brain T-spine and lumbar spine.   Per previous provider follow-up on MRI and treat accordingly.  MRI revealed small meningioma as well as erosive arthritis of the C3-C4, there is no other acute abnormalities present.  Due to this finding  will consult with neurosurgery for further recommendations, spoke with Margo Aye NP of neurosurgery if patient not having neck pain this is benign and can follow-up as needed.  I reassessed the patient she is having no complaints at this time, she is in agreement with plan and discharge.  Made her aware of meningioma seen on MRI,  will have her have regular follow-up with neurosurgery for further evaluation.            Marcello Fennel, PA-C 08/31/22 Colbert, [place], DO 09/02/22 1457

## 2022-08-31 NOTE — Discharge Instructions (Addendum)
Your imaging reveals that you have a small meningioma on the left side of your brain which is are benign tumors, it also shows that you have some arthritis in your neck, it is recommend that you follow-up with neurosurgery for further evaluation.  I recommend over-the-counter pain medication as needed for your pain, please continue with all home medications.  Come back to the emergency department if you develop chest pain, shortness of breath, severe abdominal pain, uncontrolled nausea, vomiting, diarrhea.

## 2022-08-31 NOTE — Progress Notes (Signed)
Orthopedic Tech Progress Note Patient Details:  [name] [name] October 01, 1946 [id number]  Patient ID: [name] [name], female   DOB: Jul 05, 1946, 76 y.o.   MRN: [id number] I attended trauma page. Karolee Stamps 08/31/2022, 12:12 AM

## 2022-08-31 NOTE — ED Notes (Signed)
Pt provided written and verbal d/c instructions. Pt verbalizes understanding. Pt assisted to lobby via wheechair. Pt reports having a ride home.

## 2022-08-31 NOTE — ED Notes (Signed)
..Trauma Response Nurse Documentation   [name] [name] is a 76 y.o. female arriving to [facility] ED via [facility]  On clopidogrel 75 mg daily. Trauma was activated as a Level 2 by charge nurse based on the following trauma criteria Elderly patients > 65 with head trauma on anti-coagulation (excluding ASA). Trauma team at the bedside on patient arrival.   Patient cleared for CT by Dr. Armandina Gemma. Pt transported to CT on CCM with trauma response nurse present to monitor.RN remained with the patient throughout their absence from the department for clinical observation due to continued complaints of neck and head pain, pt remained in direct visualization of this RN and all movement done utilizing cspine stabilization with ccollar in place.   GCS 15.  History   Past Medical History:  Diagnosis Date   Allergy    environmental   Asthma    Brain tumor ([place])    Chronic diastolic CHF (congestive heart failure) (HCC)    CKD (chronic kidney disease)    CLL (chronic lymphocytic leukemia) (HCC)    Constipation 11/15/2011   COPD (chronic obstructive pulmonary disease) (H[facility])    CVA (cerebral vascular accident) ([place])    Diabetes ([place])    GERD (gastroesophageal reflux disease)    Headache    migraines   Hyperlipidemia    Hypertension    PVD (peripheral vascular disease) ([place])    Stroke ([place]) 1998   in 1998 due to tumor-right side     Past Surgical History:  Procedure Laterality Date   ABDOMINAL AORTOGRAM W/LOWER EXTREMITY Bilateral 08/12/2020   Procedure: ABDOMINAL AORTOGRAM W/BILATERAL LOWER EXTREMITY RUNOFF;  Surgeon: Serafina Mitchell, MD;  Location: [facility];  Service: Cardiovascular;  Laterality: Bilateral;   ABI  02/2012   ABI <0.65 BL 02/2012   BRAIN MENINGIOMA EXCISION     BRAIN TUMOR EXCISION     Bypass grafting of RLE for PAD claudication      CARDIAC CATHETERIZATION  04/09/208   CESAREAN SECTION     1974, 78 ,80   CHOLECYSTECTOMY, LAPAROSCOPIC     COLONOSCOPY  2014    [place], [place]   ENDARTERECTOMY FEMORAL Right 10/03/2020   Procedure: RIGHT FEMORAL ENDARTERECTOMY WITH VEIN PATCH ANGIOPLASTY;  Surgeon: Serafina Mitchell, MD;  Location: [facility];  Service: Vascular;  Laterality: Right;   ESOPHAGEAL DILATION     FEMORAL-POPLITEAL BYPASS GRAFT Right 10/03/2020   Procedure: RIGHT FEMORAL- BELOW KNEE POPLITEAL BYPASS USING GORE PROPATEN 6MM GRAFT;  Surgeon: Serafina Mitchell, MD;  Location: [facility];  Service: Vascular;  Laterality: Right;   PATCH ANGIOPLASTY  10/03/2020   Procedure: VEIN PATCH ANGIOPLASTY;  Surgeon: Serafina Mitchell, MD;  Location: [facility] OR;  Service: Vascular;;       Initial Focused Assessment (If applicable, or please see trauma documentation): Hematoma to R forehead, A & O, GCS 15  CT's Completed:   CT Head, CT C-Spine, and CT abdomen/pelvis w/ contrast   Interventions:  -initial trauma assessment -IV placement -lab draw -transport to/from CT -family communication  Plan for disposition:    Consults completed:    Event Summary:  Pt arrived via [facility] from home, pt reports she was walking to kitchen utilizing walker fell forward hitting head on kitchen linoleum floor. Denies LOC, denies dizziness. C/o pain to head,neck,chest,abdomen,back, R leg. +3 pitting edema RLE, per pt this is not new. Pupils equal/brisk '@3'$ . Pt transported to /from CT maintaining cspine precautions during transfer.  Family at bedside upon return from [place],  updated on POC.  Due to new findings pt to have MRI brain,cspine,T & L spine.      Bedside handoff with ED RN Hala.    [name] [name], [place]  Trauma Response RN  Please call TRN at [phone] for further assistance.

## 2022-08-31 NOTE — ED Notes (Signed)
Spoke with MRI regarding pt status, advised will send for her soon to complete MRI

## 2022-08-31 NOTE — ED Notes (Signed)
Pt to MRI

## 2022-09-01 ENCOUNTER — Telehealth: Payer: Self-pay

## 2022-09-01 NOTE — Patient Outreach (Signed)
  Care Coordination TOC Note Transition Care Management Follow-up Telephone Call Date of discharge and from where: [facility]22 How have you been since you were released from the hospital? "I am doing pretty good, my forehead still hurts and I am putting ice on my bump". Any questions or concerns? No  Items Reviewed: Did the pt receive and understand the discharge instructions provided? Yes  Medications obtained and verified? Yes  Other? No  Any new allergies since your discharge? No  Dietary orders reviewed? Yes Do you have support at home? Yes   Home Care and Equipment/Supplies: Were home health services ordered? no If so, what is the name of the agency? N/A  Has the agency set up a time to come to the patient's home? not applicable Were any new equipment or medical supplies ordered?  No What is the name of the medical supply agency? N/A Were you able to get the supplies/equipment? not applicable Do you have any questions related to the use of the equipment or supplies? No  Functional Questionnaire: (I = Independent and D = Dependent) ADLs: I  Bathing/Dressing- I  Meal Prep- I  Eating- I  Maintaining continence- I  Transferring/Ambulation- I  Managing Meds- I  Follow up appointments reviewed:  PCP Hospital f/u appt confirmed? No   Specialist Hospital f/u appt confirmed? No   Are transportation arrangements needed? No  If their condition worsens, is the pt aware to call PCP or go to the Emergency Dept.? Yes Was the patient provided with contact information for the PCP's office or ED? Yes Was to pt encouraged to call back with questions or concerns? Yes  SDOH assessments and interventions completed:   Yes  Care Coordination Interventions Activated:  Yes   Care Coordination Interventions:  PCP follow up appointment requested    Encounter Outcome:  Pt. Visit Completed

## 2022-09-02 ENCOUNTER — Encounter: Payer: Self-pay | Admitting: Family Medicine

## 2022-09-02 ENCOUNTER — Ambulatory Visit (INDEPENDENT_AMBULATORY_CARE_PROVIDER_SITE_OTHER): Payer: Medicare Other | Admitting: Family Medicine

## 2022-09-02 VITALS — BP 123/62 | HR 74 | Ht 62.0 in | Wt 130.4 lb

## 2022-09-02 DIAGNOSIS — E1151 Type 2 diabetes mellitus with diabetic peripheral angiopathy without gangrene: Secondary | ICD-10-CM

## 2022-09-02 DIAGNOSIS — D329 Benign neoplasm of meninges, unspecified: Secondary | ICD-10-CM | POA: Diagnosis not present

## 2022-09-02 DIAGNOSIS — M7989 Other specified soft tissue disorders: Secondary | ICD-10-CM
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
## 2022-09-02 NOTE — Patient Instructions (Addendum)
It was great to see you!  Call the neurosurgeon [facility]) for an appointment. Explain that you were seen in the ED and they recommended follow up. I will also place a new referral. Their phone # is [phone] Take your time with your walker  If you ever feel unsafe at home please get help right away Call your vascular doctor for an appointment. Continue wearing your compression socks   Take care, Dr Rock Nephew

## 2022-09-03 ENCOUNTER — Telehealth: Payer: Self-pay

## 2022-09-03 DIAGNOSIS — I70211 Atherosclerosis of native arteries of extremities with intermittent claudication, right leg: Secondary | ICD-10-CM

## 2022-09-03 DIAGNOSIS — I739 Peripheral vascular disease, unspecified: Secondary | ICD-10-CM

## 2022-09-03 DIAGNOSIS — M7989 Other specified soft tissue disorders: Secondary | ICD-10-CM | POA: Insufficient documentation

## 2022-09-03 DIAGNOSIS — D329 Benign neoplasm of meninges, unspecified: Secondary | ICD-10-CM | POA: Insufficient documentation

## 2022-09-03 NOTE — Assessment & Plan Note (Signed)
Noted on MRI brain in the ED on 9/4. No apparent neuro deficits. ED recommended neurosurgery follow-up. New referral placed today and patient given phone # for [facility]surgery.

## 2022-09-03 NOTE — Telephone Encounter (Signed)
Pt called stating that her doctor wanted her to be seen by her vascular provider. Pt had fallen on 9/4 and went to ED. Pt went to their PCP today and they requested that she f/u with this office for leg swelling. Reviewed pt's chart, pt was due to 1 yr f/u for surveillance. Pt explained that the swelling she was experiencing was not new and there had been no changes since her fall. She is elevating when able and wearing compression stockings. Appts scheduled for [place] and [place] per recall. Confirmed understanding.

## 2022-09-03 NOTE — Assessment & Plan Note (Signed)
Apparently chronic s/p right femoral endarterectomy with vein patch angioplasty and right femoral to below the knee popliteal artery bypass in 2021. On chart review, multiple notes have documented right leg edema which she manages with compression stockings. Advised vascular follow up. Return precautions reviewed.

## 2022-09-14 ENCOUNTER — Other Ambulatory Visit: Payer: Self-pay | Admitting: Family Medicine

## 2022-09-14 ENCOUNTER — Other Ambulatory Visit: Payer: Self-pay | Admitting: Physical Medicine and Rehabilitation

## 2022-09-14 DIAGNOSIS — G8929 Other chronic pain: Secondary | ICD-10-CM

## 2022-09-16 ENCOUNTER — Other Ambulatory Visit: Payer: Self-pay | Admitting: Family Medicine

## 2022-09-16 DIAGNOSIS — M545 Low back pain, unspecified: Secondary | ICD-10-CM

## 2022-09-17 NOTE — Telephone Encounter (Signed)
Refused tramadol request. Refill already sent 3 days ago.

## 2022-09-17 NOTE — Progress Notes (Unsigned)
Office Note     CC:  follow up Requesting Provider:  Alcus Dad, MD  HPI: [name] [name] is a 76 y.o. (1946-07-24) female who presents for surveillance follow up. She has history of  right femoral endarterectomy with vein patch angioplasty and right femoral to below the knee popliteal artery bypass with Gore-Tex by Dr. Trula Slade on 10/04/2020.  She is also had history of left lower extremity endovascular intervention in [place].  She has had edema of right lower extremity since surgery, which is managed with compression and elevation.  She states the edema of her right leg does not bother her much unless she is on her feet throughout most of the day without compression. She has known left lower extremity SFA occlusion.  She denies any claudication, rest pain, or nonhealing wounds.     The pt is on a statin for cholesterol management.  The pt is not on a daily aspirin.   Other AC:  Plavix The pt is on CCB for hypertension.   The pt is diabetic.   Tobacco hx:  current, 1/4 ppd  Past Medical History:  Diagnosis Date   Allergy    environmental   Asthma    Brain tumor ([place])    Chronic diastolic CHF (congestive heart failure) (HCC)    CKD (chronic kidney disease)    CLL (chronic lymphocytic leukemia) (HCC)    Constipation 11/15/2011   COPD (chronic obstructive pulmonary disease) (HCC)    CVA (cerebral vascular accident) ([place])    Diabetes ([place])    GERD (gastroesophageal reflux disease)    Headache    migraines   Hyperlipidemia    Hypertension    PVD (peripheral vascular disease) ([place])    Stroke ([place]) 1998   in 1998 due to tumor-right side    Past Surgical History:  Procedure Laterality Date   ABDOMINAL AORTOGRAM W/LOWER EXTREMITY Bilateral 08/12/2020   Procedure: ABDOMINAL AORTOGRAM W/BILATERAL LOWER EXTREMITY RUNOFF;  Surgeon: Serafina Mitchell, MD;  Location: [facility];  Service: Cardiovascular;  Laterality: Bilateral;   ABI  02/2012   ABI <0.65 BL 02/2012    BRAIN MENINGIOMA EXCISION     BRAIN TUMOR EXCISION     Bypass grafting of RLE for PAD claudication      CARDIAC CATHETERIZATION  04/09/208   CESAREAN SECTION     1974, 78 ,80   CHOLECYSTECTOMY, LAPAROSCOPIC     COLONOSCOPY  2014   [place], [place]   ENDARTERECTOMY FEMORAL Right 10/03/2020   Procedure: RIGHT FEMORAL ENDARTERECTOMY WITH VEIN PATCH ANGIOPLASTY;  Surgeon: Serafina Mitchell, MD;  Location: [facility];  Service: Vascular;  Laterality: Right;   ESOPHAGEAL DILATION     FEMORAL-POPLITEAL BYPASS GRAFT Right 10/03/2020   Procedure: RIGHT FEMORAL- BELOW KNEE POPLITEAL BYPASS USING GORE PROPATEN 6MM GRAFT;  Surgeon: Serafina Mitchell, MD;  Location: [facility];  Service: Vascular;  Laterality: Right;   PATCH ANGIOPLASTY  10/03/2020   Procedure: VEIN PATCH ANGIOPLASTY;  Surgeon: Serafina Mitchell, MD;  Location: [facility] OR;  Service: Vascular;;    Social History   Socioeconomic History   Marital status: Single    Spouse name: Not on file   Number of children: 3   Years of education: Not on file   Highest education level: Not on file  Occupational History   Occupation: DISABLED    Employer: DISABLED  Tobacco Use   Smoking status: Every Day    Packs/day: 0.25    Years: 30.00  Total pack years: 7.50    Types: Cigarettes    Start date: 01/01/1976   Smokeless tobacco: Never   Tobacco comments:    ~ 3 cigarettes / day  Vaping Use   Vaping Use: Never used  Substance and Sexual Activity   Alcohol use: Not Currently   Drug use: Not Currently   Sexual activity: Yes  Other Topics Concern   Not on file  Social History Narrative   ** Merged History Encounter **       Health Care POA:  Emergency Contact: Val Riles [phone] (c) End of Life Plan:  Who lives with you: Lives with husband Any pets: none Diet: Patient lacks financial resources for much food. Pt reports eating what is available. Exercise: Patient    does not have an exercise plan. Seatbelts: Patient reports wearing seatbelt  when in vehicle.  [name] Exposure/Protection: Hobbies: Bowling, computer games, Bingo  Has financial difficulties and transportation issues as she and her husband share transpo   rtation      Social Determinants of Health   Financial Resource Strain: High Risk (01/13/2022)   Overall Financial Resource Strain (CARDIA)    Difficulty of Paying Living Expenses: Hard  Food Insecurity: Not on file  Transportation Needs: No Transportation Needs (09/01/2022)   PRAPARE - Hydrologist (Medical): No    Lack of Transportation (Non-Medical): No  Physical Activity: Not on file  Stress: Not on file  Social Connections: Not on file  Intimate Partner Violence: Not on file    Family History  Problem Relation Age of Onset   Heart disease Mother    Asthma Mother    Cancer Mother        uterine    Depression Mother    Heart attack Mother 18   Hyperlipidemia Mother    Hypertension Mother    Stroke Mother    Kidney disease Mother    Heart attack Sister    Stroke Sister    Depression Sister    Diabetes Sister    Hyperlipidemia Sister    Depression Sister    Diabetes Sister    Hyperlipidemia Sister    HIV/AIDS Sister    Diabetes Brother    Asthma Brother    Hypertension Brother    Cancer Maternal Aunt        lung   Heart disease Maternal Grandmother    Heart attack Maternal Grandmother    Cancer Brother        colon   HIV/AIDS Brother    Colon cancer Neg Hx     Current Outpatient Medications  Medication Sig Dispense Refill   acetaminophen (TYLENOL) 500 MG tablet Take 500-1,000 mg by mouth every 6 (six) hours as needed for moderate pain.     albuterol (PROVENTIL) (2.5 MG/3ML) 0.083% nebulizer solution Inhale 1 vial via nebulizer every 6 hours for wheezing or shortness of breath as needed 75 mL 11   albuterol (VENTOLIN HFA) 108 (90 Base) MCG/ACT inhaler Inhale 2 puffs by mouth every 6 hours as needed for wheezing or shortness of breath 8.5 each 11   Alcohol  Swabs (ALCOHOL PADS) 70 % PADS      amLODipine (NORVASC) 10 MG tablet Take 1 tablet by mouth every day 30 tablet 11   atorvastatin (LIPITOR) 80 MG tablet Take 1 tablet (80 mg total) by mouth daily. PLEASE CONTACT THE OFFICE FOR ADDITIONAL REFILLS 30 tablet 0   Blood Glucose Calibration (OT ULTRA/FASTTK CNTRL SOLN) SOLN  See admin instructions.     blood glucose meter kit and supplies KIT Dispense based on patient and insurance preference. Use up to four times daily as directed. 1 each 0   Blood Glucose Monitoring Suppl (ACCU-CHEK GUIDE ME) w/Device KIT Use to test blood sugar twice per day. E11.9 1 kit 1   clopidogrel (PLAVIX) 75 MG tablet Take 1 tablet by mouth every day 30 tablet 0   cyclobenzaprine (FLEXERIL) 5 MG tablet Take 1 tablet (5 mg total) by mouth 2 (two) times daily as needed for muscle spasms. 30 tablet 0   DULoxetine (CYMBALTA) 30 MG capsule Take 1 capsule by mouth every day 30 capsule 11   famotidine (PEPCID) 20 MG tablet Take 1 tablet by mouth twice daily 60 tablet 11   fluticasone furoate-vilanterol (BREO ELLIPTA) 100-25 MCG/ACT AEPB Inhale 1 puff by mouth every day 60 each 11   furosemide (LASIX) 20 MG tablet Take 1 tablet (20 mg total) by mouth daily. PLEASE CONTACT THE OFFICE FOR ADDITIONAL REFILLS 30 tablet 0   gabapentin (NEURONTIN) 300 MG capsule Take 2 capsules by mouth twice daily 120 capsule 0   glucose blood (ONETOUCH VERIO) test strip Use to check blood sugar once a day 100 each 12   hydrALAZINE (APRESOLINE) 25 MG tablet Take 1 tablet (25 mg total) by mouth 2 (two) times daily. PLEASE CONTACT THE OFFICE FOR ADDITIONAL REFILLS 60 tablet 0   iron polysaccharides (NIFEREX) 150 MG capsule Take 1 capsule (150 mg total) by mouth daily. 30 capsule 5   JARDIANCE 25 MG TABS tablet Take 1 tablet by mouth every day 30 tablet 11   Lancet Devices (EASY MINI EJECT LANCING DEVICE) MISC daily.     metFORMIN (GLUCOPHAGE) 500 MG tablet Take 1 tablet by mouth every day 30 tablet 11    montelukast (SINGULAIR) 10 MG tablet Take 1 tablet (10 mg total) by mouth at bedtime. 30 tablet 11   nitroGLYCERIN (NITROSTAT) 0.4 MG SL tablet Please take as prescribed. 25 tablet 11   OneTouch Delica Lancets 92Z MISC Use to check blood sugars 2x per day. E11.9 100 each 0   traMADol (ULTRAM) 50 MG tablet Take 1 tablet twice daily as needed for severe pain. 60 tablet 1   venetoclax (VENCLEXTA) 100 MG tablet Take 2 tablets (200 mg total) by mouth daily. Tablets should be swallowed whole with a meal and a full glass of water. 60 tablet 2   No current facility-administered medications for this visit.    Allergies  Allergen Reactions   Gazyva [Obinutuzumab] Shortness Of Breath    Acute respiratory distress   Aspirin     Abdominal pain.    No [facility] Food Allergies Diarrhea and Nausea And Vomiting    Cabbage, stomach pain    Aspirin Other (See Comments)    irritates stomach   Baclofen Other (See Comments)    Stomach irritation     REVIEW OF SYSTEMS:   '[X]'  denotes positive finding, '[ ]'  denotes negative finding Cardiac  Comments:  Chest pain or chest pressure:    Shortness of breath upon exertion:    Short of breath when lying flat:    Irregular heart rhythm:        Vascular    Pain in calf, thigh, or hip brought on by ambulation:    Pain in feet at night that wakes you up from your sleep:     Blood clot in your veins:    Leg swelling:  Pulmonary    Oxygen at home:    Productive cough:     Wheezing:         Neurologic    Sudden weakness in arms or legs:     Sudden numbness in arms or legs:     Sudden onset of difficulty speaking or slurred speech:    Temporary loss of vision in one eye:     Problems with dizziness:         Gastrointestinal    Blood in stool:     Vomited blood:         Genitourinary    Burning when urinating:     Blood in urine:        Psychiatric    Major depression:         Hematologic    Bleeding problems:    Problems with blood  clotting too easily:        Skin    Rashes or ulcers:        Constitutional    Fever or chills:      PHYSICAL EXAMINATION:  There were no vitals filed for this visit.  General:  WDWN in NAD; vital signs documented above Gait: Not observed HENT: WNL, normocephalic Pulmonary: normal non-labored breathing , without Rales, rhonchi,  wheezing Cardiac: {Desc; regular/irreg:14544} HR, without  Murmurs {With/Without:20273} carotid bruit*** Abdomen: soft, NT, no masses Skin: {With/Without:20273} rashes Vascular Exam/Pulses:  Right Left  Radial {Exam; arterial pulse strength 0-4:30167} {Exam; arterial pulse strength 0-4:30167}  Ulnar {Exam; arterial pulse strength 0-4:30167} {Exam; arterial pulse strength 0-4:30167}  Femoral {Exam; arterial pulse strength 0-4:30167} {Exam; arterial pulse strength 0-4:30167}  Popliteal {Exam; arterial pulse strength 0-4:30167} {Exam; arterial pulse strength 0-4:30167}  DP {Exam; arterial pulse strength 0-4:30167} {Exam; arterial pulse strength 0-4:30167}  PT {Exam; arterial pulse strength 0-4:30167} {Exam; arterial pulse strength 0-4:30167}   Extremities: {With/Without:20273} ischemic changes, {With/Without:20273} Gangrene , {With/Without:20273} cellulitis; {With/Without:20273} open wounds;  Musculoskeletal: no muscle wasting or atrophy  Neurologic: A&O X 3;  No focal weakness or paresthesias are detected Psychiatric:  The pt has {Desc; normal/abnormal:11317::"Normal"} affect.   Non-Invasive Vascular Imaging:   ***    ASSESSMENT/PLAN:: 76 y.o. female here for follow up for PAD.   -***   [name], PA-C [facility] MD:  Trula Slade

## 2022-09-18 ENCOUNTER — Other Ambulatory Visit: Payer: Self-pay | Admitting: Family Medicine

## 2022-09-18 DIAGNOSIS — E1151 Type 2 diabetes mellitus with diabetic peripheral angiopathy without gangrene: Secondary | ICD-10-CM

## 2022-09-20 ENCOUNTER — Ambulatory Visit (HOSPITAL_COMMUNITY): Payer: Medicare Other

## 2022-09-20 ENCOUNTER — Ambulatory Visit (HOSPITAL_COMMUNITY): Payer: Medicare Other | Attending: Surgery

## 2022-09-20 ENCOUNTER — Ambulatory Visit: Payer: Medicare Other

## 2022-09-20 DIAGNOSIS — I739 Peripheral vascular disease, unspecified: Secondary | ICD-10-CM

## 2022-09-23 ENCOUNTER — Ambulatory Visit (INDEPENDENT_AMBULATORY_CARE_PROVIDER_SITE_OTHER): Payer: Medicare Other | Admitting: Family Medicine

## 2022-09-23 ENCOUNTER — Encounter: Payer: Self-pay | Admitting: Family Medicine

## 2022-09-23 VITALS — BP 122/62 | HR 70 | Wt 128.0 lb

## 2022-09-23 DIAGNOSIS — Z23 Encounter for immunization: Secondary | ICD-10-CM

## 2022-09-23 DIAGNOSIS — E1151 Type 2 diabetes mellitus with diabetic peripheral angiopathy without gangrene: Secondary | ICD-10-CM | POA: Diagnosis not present

## 2022-09-23 LAB — POCT GLYCOSYLATED HEMOGLOBIN (HGB A1C): HbA1c, POC (controlled diabetic range): 7.3 % — AB (ref 0.0–7.0)

## 2022-09-23 NOTE — Patient Instructions (Addendum)
It was great to see you!  You are overdue for a diabetic eye exam. Please contact your eye doctor to schedule an appointment ASAP. Ask them to fax the notes to our office [phone] is our fax).  Your A1c was 7.3% today, which means your diabetes is well-controlled. Your goal A1c is 7-7.5%. Continue your current medications with no changes. We will repeat your A1c in 3 months.  Please complete the blue Advanced Directives packet and return it to our office at your earliest convenience. We can notarize it here.  I will look into the neurosurgery referral for you.  Take care, Dr Rock Nephew

## 2022-09-23 NOTE — Progress Notes (Signed)
    SUBJECTIVE:   CHIEF COMPLAINT / HPI:   Type 2 Diabetes Patient is a 76 y.o. female who presents today for diabetes follow-up.  Home medications include: Metformin '500mg'$  daily, Jardiance '25mg'$  daily Patient reports excellent medication compliance. Patient does not check sugar at home. Unable to find glucometer. No hypoglycemic symptoms  Most recent A1Cs:  Lab Results  Component Value Date   HGBA1C 7.3 (A) 09/23/2022   HGBA1C 8.6 (A) 05/31/2022   HGBA1C 7.1 (A) 11/06/2021   Last Microalbumin, LDL, Creatinine: Lab Results  Component Value Date   LDLCALC 40 10/21/2021   CREATININE 1.39 (H) 08/30/2022    Patient is not up to date on diabetic eye. Patient is up to date on diabetic foot exam.  PERTINENT  PMH / PSH: CLL, CKD stage III, HTN, PAD  OBJECTIVE:   BP 122/62   Pulse 70   Wt 128 lb (58.1 kg)   SpO2 93%   BMI 23.41 kg/m   Gen: NAD, pleasant, able to participate in exam HEENT: edentulous CV: RRR, normal S1/S2, no murmur Resp: Normal effort, lungs CTAB Extremities: trace nonpitting edema RLE which is chronic Skin: warm and dry Neuro: alert, no obvious focal deficits Psych: Normal affect and mood   ASSESSMENT/PLAN:   DM (diabetes mellitus), type 2 with peripheral vascular complications ([place]) Well controlled. A1c 7.3% today. Goal A1c <7.5%. -Continue current medications (Metformin, Jardiance) -Check urine microalbumin today -Patient encouraged to have diabetic eye exam -Recheck A1c in 3 months  -Will attempt to get recent records from her nephrologist   Health Maintenance -Given blue advanced directives packet today -Flu shot administered  Alcus Dad, MD [place]

## 2022-09-24 LAB — MICROALBUMIN / CREATININE URINE RATIO
Creatinine, Urine: 60.5 mg/dL
Microalb/Creat Ratio: 52 mg/g creat — ABNORMAL HIGH (ref 0–29)
Microalbumin, Urine: 31.4 ug/mL

## 2022-09-24 NOTE — Assessment & Plan Note (Signed)
Well controlled. A1c 7.3% today. Goal A1c <7.5%. -Continue current medications (Metformin, Jardiance) -Check urine microalbumin today -Patient encouraged to have diabetic eye exam -Recheck A1c in 3 months

## 2022-10-08 NOTE — Congregational Nurse Program (Signed)
  Dept: [phone]   Congregational Nurse Program Note  Date of Encounter: 03/14/2016  Past Medical History: Past Medical History:  Diagnosis Date   Allergy    environmental   Asthma    Brain tumor ([place])    Chronic diastolic CHF (congestive heart failure) (HCC)    CKD (chronic kidney disease)    CLL (chronic lymphocytic leukemia) (HCC)    Constipation 11/15/2011   COPD (chronic obstructive pulmonary disease) (HCC)    CVA (cerebral vascular accident) ([place])    Diabetes ([place])    GERD (gastroesophageal reflux disease)    Headache    migraines   Hyperlipidemia    Hypertension    PVD (peripheral vascular disease) ([place])    Stroke ([place]) 1998   in 1998 due to tumor-right side    Encounter Details:  Brief encounter at [facility], Multiple medical issues.  Struggles to come to church

## 2022-10-12 ENCOUNTER — Other Ambulatory Visit: Payer: Self-pay | Admitting: Family Medicine

## 2022-10-26 ENCOUNTER — Other Ambulatory Visit: Payer: Self-pay

## 2022-10-27 MED ORDER — GABAPENTIN 300 MG PO CAPS
600.0000 mg | ORAL_CAPSULE | Freq: Two times a day (BID) | ORAL | 2 refills | Status: DC
Start: 2022-10-27 — End: 2022-11-29

## 2022-11-02 ENCOUNTER — Other Ambulatory Visit: Payer: Self-pay | Admitting: Family Medicine

## 2022-11-02 DIAGNOSIS — G8929 Other chronic pain: Secondary | ICD-10-CM

## 2022-11-09 ENCOUNTER — Other Ambulatory Visit: Payer: Self-pay | Admitting: Cardiology

## 2022-11-09 DIAGNOSIS — I10 Essential (primary) hypertension: Secondary | ICD-10-CM

## 2022-11-14 ENCOUNTER — Other Ambulatory Visit: Payer: Self-pay | Admitting: Family Medicine

## 2022-11-16 ENCOUNTER — Telehealth: Payer: Self-pay

## 2022-11-16 NOTE — Telephone Encounter (Signed)
Patient calls nurse line requesting letter from PCP stating that she no longer has a need for oxygen. This is needed for [employer] to pick up oxygen and equipment.   Once completed, letter will need to be faxed to [facility] [place].   Fax number: [phone]  Forwarding to PCP.   Talbot Grumbling, RN

## 2022-11-16 NOTE — Telephone Encounter (Signed)
Letter completed as requested. Routed to [employer] staff to fax to [employer].

## 2022-11-28 ENCOUNTER — Other Ambulatory Visit: Payer: Self-pay | Admitting: Physical Medicine and Rehabilitation

## 2022-11-28 ENCOUNTER — Other Ambulatory Visit: Payer: Self-pay | Admitting: Cardiology

## 2022-11-28 DIAGNOSIS — I10 Essential (primary) hypertension: Secondary | ICD-10-CM

## 2022-12-24 ENCOUNTER — Ambulatory Visit (INDEPENDENT_AMBULATORY_CARE_PROVIDER_SITE_OTHER): Payer: Medicare Other | Admitting: Family Medicine

## 2022-12-24 ENCOUNTER — Encounter: Payer: Self-pay | Admitting: Family Medicine

## 2022-12-24 VITALS — BP 114/59 | HR 67 | Wt 135.2 lb

## 2022-12-24 DIAGNOSIS — F17200 Nicotine dependence, unspecified, uncomplicated: Secondary | ICD-10-CM

## 2022-12-24 DIAGNOSIS — R0609 Other forms of dyspnea: Secondary | ICD-10-CM

## 2022-12-24 MED ORDER — VARENICLINE TARTRATE (STARTER) 0.5 MG X 11 & 1 MG X 42 PO TBPK: ORAL_TABLET | ORAL | 0 refills | Status: DC

## 2022-12-24 NOTE — Progress Notes (Signed)
SUBJECTIVE:   CHIEF COMPLAINT / HPI:  No chief complaint on file.   Patient reports she has had dyspnea on exertion walking around in her house to different rooms feeling SOB for the past 1-2 weeks. Denies fever, cough, congestion. She had chest pain one time 1 week ago which resolved with rest, none currently. She has chronic leg swelling from venous insufficiency which is unchanged. She reports good adherence with her furosemide and COPD inhalers.  She wants to discuss getting on the [employer] portable oxygen device. She has used home oxygen previously after a hospitalization for pneumonia but she does not have this any longer.  She will be visiting her sister in [place] soon who likes to walk around a lot and she would like to wear the oxygen as she has shortness of breath which limits the amount that she can walk.  Patient also wants to start Chantix for smoking cessation.  She is not interested in nicotine patch.  Currently smoking 0.5 ppd. She does not want to set a quit date right now.  PERTINENT  PMH / PSH: HFpEF, COPD, CKD 3, CLL, T2DM, HTN, venous insufficiency, PAD, tobacco use, HLD  Patient Care Team: Alcus Dad, MD as PCP - General (Family Medicine) Sueanne Margarita, MD as PCP - Cardiology (Cardiology) Danna Hefty, DO (Family Medicine) Lazaro Arms, RN as Case Manager   OBJECTIVE:   BP (!) 114/59   Pulse 67   Wt 135 lb 4 oz (61.3 kg)   SpO2 100%   BMI 24.74 kg/m   Physical Exam Constitutional:      General: She is not in acute distress. HENT:     Head: Normocephalic and atraumatic.  Cardiovascular:     Rate and Rhythm: Normal rate and regular rhythm.     Heart sounds: Normal heart sounds.  Pulmonary:     Effort: Pulmonary effort is normal. No respiratory distress.     Breath sounds: Normal breath sounds. No wheezing.     Comments: Breath sounds diminished throughout Musculoskeletal:     Cervical back: Neck supple.  Neurological:     Mental  Status: She is alert.         12/24/2022    2:49 PM  Depression screen PHQ 2/9  Decreased Interest 0  Down, Depressed, Hopeless 0  PHQ - 2 Score 0  Altered sleeping 0  Tired, decreased energy 0  Change in appetite 0  Feeling bad or failure about yourself  0  Trouble concentrating 0  Moving slowly or fidgety/restless 0  Suicidal thoughts 0  PHQ-9 Score 0     {Show previous vital signs (optional):23777}    ASSESSMENT/PLAN:   Dyspnea on exertion  Patient reports worsening dyspnea on exertion over the past 1 to 2 weeks with some occasional chest pain with exertion as well.  Does not appear fluid overloaded on exam.  Maintaining adequate oxygen saturation with ambulation so will not qualify for home oxygen.  This could be progression of her COPD but could have a possible CAD/stable angina component.  Given change in dyspnea, advised checking lab work and CXR but patient declined. - advised to f/u her cardiologist - CXR, labs declined - does not qualify for home O2  TOBACCO DEPENDENCE Current 0.5 pack/day smoker interested in smoking sensation. - start varencline starter pack    Return in about 4 weeks (around 01/21/2023) for f/u smoking cessation PCP or pharmacy visit.   Zola Button, MD [facility]  

## 2022-12-24 NOTE — Patient Instructions (Addendum)
It was nice seeing you today!  Great news that your oxygen levels stayed normal with walking but unfortunately you will qualify for portable oxygen.  I recommend that you get in to see your heart doctor regarding your increased shortness of breath.  Stay well, Zola Button, MD [place] [phone]  --  Make sure to check out at the front desk before you leave today.  Please arrive at least 15 minutes prior to your scheduled appointments.  If you had blood work today, I will send you a MyChart message or a letter if results are normal. Otherwise, I will give you a call.  If you had a referral placed, they will call you to set up an appointment. Please give [place] a call if you don't hear back in the next 2 weeks.  If you need additional refills before your next appointment, please call your pharmacy first.

## 2022-12-24 NOTE — Assessment & Plan Note (Signed)
Current 0.5 pack/day smoker interested in smoking sensation. - start varencline starter pack

## 2022-12-28 ENCOUNTER — Other Ambulatory Visit: Payer: Self-pay | Admitting: Family Medicine

## 2022-12-28 DIAGNOSIS — M545 Low back pain, unspecified: Secondary | ICD-10-CM

## 2023-01-05 ENCOUNTER — Other Ambulatory Visit: Payer: Self-pay | Admitting: Family Medicine

## 2023-01-06 ENCOUNTER — Encounter: Payer: Self-pay | Admitting: Hematology

## 2023-01-07 ENCOUNTER — Ambulatory Visit: Payer: Medicare Other | Admitting: Cardiology

## 2023-01-12 ENCOUNTER — Encounter: Payer: Self-pay | Admitting: Cardiology

## 2023-01-12 ENCOUNTER — Ambulatory Visit: Payer: 59 | Attending: Cardiology | Admitting: Cardiology

## 2023-01-12 VITALS — BP 130/58 | HR 66 | Ht 62.0 in | Wt 130.8 lb

## 2023-01-12 DIAGNOSIS — R079 Chest pain, unspecified: Secondary | ICD-10-CM

## 2023-01-12 DIAGNOSIS — E7800 Pure hypercholesterolemia, unspecified: Secondary | ICD-10-CM

## 2023-01-12 DIAGNOSIS — I739 Peripheral vascular disease, unspecified: Secondary | ICD-10-CM

## 2023-01-12 DIAGNOSIS — I5032 Chronic diastolic (congestive) heart failure: Secondary | ICD-10-CM

## 2023-01-12 DIAGNOSIS — I10 Essential (primary) hypertension: Secondary | ICD-10-CM

## 2023-01-12 NOTE — Progress Notes (Signed)
Cardiology Consult Note    Date:  01/12/2023   ID:  AZANI BROGDON, DOB 1946/07/30, MRN [id number]  PCP:  Alcus Dad, MD  Cardiologist:  Fransico Him, MD   Chief Complaint  Patient presents with   Chest Pain   Hyperlipidemia   Hypertension   Congestive Heart Failure     History of Present Illness:  [name] [name] is a 77 y.o. female  with a hx of CLL, asthma, chronic diastolic CHF, DM2, GERD, HTN, HLD, chronic venous insufficiency, CKD stage 3 and PVD.  She sees Dr. Arita Miss for PVD with LE dopplers showing bilateral SFA occlusions s/p right femoral endarterectomy with patch angioplasty.    She is here today for followup and is doing well.  She has chronic DOE with walking that she thinks is stable and unchanged from when I saw her last. She denies any chest pain or pressure, PND, orthopnea, dizziness, palpitations or syncope. She has occasional LE edema. She is compliant with her meds and is tolerating meds with no SE.    Past Medical History:  Diagnosis Date   Allergy    environmental   Asthma    Brain tumor ([place])    Chronic diastolic CHF (congestive heart failure) (HCC)    CKD (chronic kidney disease)    CLL (chronic lymphocytic leukemia) (HCC)    Constipation 11/15/2011   COPD (chronic obstructive pulmonary disease) (HCC)    CVA (cerebral vascular accident) ([place])    Diabetes ([place])    GERD (gastroesophageal reflux disease)    Headache    migraines   Hyperlipidemia    Hypertension    PVD (peripheral vascular disease) ([place])    Stroke ([place]) 1998   in 1998 due to tumor-right side    Past Surgical History:  Procedure Laterality Date   ABDOMINAL AORTOGRAM W/LOWER EXTREMITY Bilateral 08/12/2020   Procedure: ABDOMINAL AORTOGRAM W/BILATERAL LOWER EXTREMITY RUNOFF;  Surgeon: Serafina Mitchell, MD;  Location: [facility];  Service: Cardiovascular;  Laterality: Bilateral;   ABI  02/2012   ABI <0.65 BL 02/2012   BRAIN MENINGIOMA EXCISION     BRAIN TUMOR EXCISION      Bypass grafting of RLE for PAD claudication      CARDIAC CATHETERIZATION  04/09/208   CESAREAN SECTION     1974, 78 ,80   CHOLECYSTECTOMY, LAPAROSCOPIC     COLONOSCOPY  2014   [place], [place]   ENDARTERECTOMY FEMORAL Right 10/03/2020   Procedure: RIGHT FEMORAL ENDARTERECTOMY WITH VEIN PATCH ANGIOPLASTY;  Surgeon: Serafina Mitchell, MD;  [facility];  Service: Vascular;  Laterality: Right;   ESOPHAGEAL DILATION     FEMORAL-POPLITEAL BYPASS GRAFT Right 10/03/2020   Procedure: RIGHT FEMORAL- BELOW KNEE POPLITEAL BYPASS USING GORE PROPATEN 6MM GRAFT;  Surgeon: Serafina Mitchell, MD;  Location: [facility];  Service: Vascular;  Laterality: Right;   PATCH ANGIOPLASTY  10/03/2020   Procedure: VEIN PATCH ANGIOPLASTY;  Surgeon: Serafina Mitchell, MD;  [facility];  Service: Vascular;;    Current Medications: Current Meds  Medication Sig   acetaminophen (TYLENOL) 500 MG tablet Take 500-1,000 mg by mouth every 6 (six) hours as needed for moderate pain.   albuterol (PROVENTIL) (2.5 MG/3ML) 0.083% nebulizer solution Inhale 1 vial via nebulizer every 6 hours for wheezing or shortness of breath as needed   albuterol (VENTOLIN HFA) 108 (90 Base) MCG/ACT inhaler Inhale 2 puffs by mouth every 6 hours as needed for wheezing or shortness of breath   Alcohol  Swabs (ALCOHOL PADS) 70 % PADS    amLODipine (NORVASC) 10 MG tablet Take 1 tablet by mouth every day   atorvastatin (LIPITOR) 80 MG tablet Take 1 tablet by mouth daily. Need appt   Blood Glucose Calibration (OT ULTRA/FASTTK CNTRL SOLN) SOLN See admin instructions.   blood glucose meter kit and supplies KIT Dispense based on patient and insurance preference. Use up to four times daily as directed.   Blood Glucose Monitoring Suppl (ACCU-CHEK GUIDE ME) w/Device KIT Use to test blood sugar twice per day. E11.9   clopidogrel (PLAVIX) 75 MG tablet Take 1 tablet by mouth every day   cyclobenzaprine (FLEXERIL) 5 MG tablet Take 1 tablet (5 mg total) by mouth 2  (two) times daily as needed for muscle spasms.   DULoxetine (CYMBALTA) 30 MG capsule Take 1 capsule by mouth every day   famotidine (PEPCID) 20 MG tablet Take 1 tablet by mouth twice daily   fluticasone furoate-vilanterol (BREO ELLIPTA) 100-25 MCG/ACT AEPB Inhale 1 puff by mouth every day   furosemide (LASIX) 20 MG tablet Take 1 tablet by mouth daily. Need appt   gabapentin (NEURONTIN) 300 MG capsule Take 2 capsules by mouth twice daily. Need MD appt   glucose blood (ONETOUCH VERIO) test strip Use to check blood sugar once a day   iron polysaccharides (NIFEREX) 150 MG capsule Take 1 capsule (150 mg total) by mouth daily.   JARDIANCE 25 MG TABS tablet Take 1 tablet by mouth every day   Lancet Devices (EASY MINI EJECT LANCING DEVICE) MISC daily.   metFORMIN (GLUCOPHAGE) 500 MG tablet Take 1 tablet by mouth every day   montelukast (SINGULAIR) 10 MG tablet Take 1 tablet by mouth at bedtime   nitroGLYCERIN (NITROSTAT) 0.4 MG SL tablet Dissolve 1 tablet under tongue every 5 minutes, up to 3 doses for chest pain   OneTouch Delica Lancets 34H MISC Use to check blood sugars 2x per day. E11.9   traMADol (ULTRAM) 50 MG tablet Take 1 tablet by mouth twice daily as needed for severe pain   Varenicline Tartrate, Starter, 0.5 MG X 11 & 1 MG X 42 TBPK 0.5 mg orally once daily for 3 days, then 0.5 mg twice daily on days 4 through 7, and then 1 mg twice daily thereafter   venetoclax (VENCLEXTA) 100 MG tablet Take 2 tablets (200 mg total) by mouth daily. Tablets should be swallowed whole with a meal and a full glass of water.    Allergies:   Gazyva [obinutuzumab], Aspirin, No healthtouch food allergies, Aspirin, and Baclofen   Social History   Socioeconomic History   Marital status: Single    Spouse name: Not on file   Number of children: 3   Years of education: Not on file   Highest education level: Not on file  Occupational History   Occupation: DISABLED    Employer: DISABLED  Tobacco Use   Smoking  status: Every Day    Packs/day: 0.25    Years: 30.00    Total pack years: 7.50    Types: Cigarettes    Start date: 01/01/1976    Passive exposure: Current   Smokeless tobacco: Never   Tobacco comments:    ~ 3 cigarettes / day  Vaping Use   Vaping Use: Never used  Substance and Sexual Activity   Alcohol use: Not Currently   Drug use: Not Currently   Sexual activity: Yes  Other Topics Concern   Not on file  Social History Narrative   **  Merged History Encounter **       Health Care POA:  Emergency Contact: Lynsay Fesperman [phone] (c) End of Life Plan:  Who lives with you: Lives with husband Any pets: none Diet: Patient lacks financial resources for much food. Pt reports eating what is available. Exercise: Patient    does not have an exercise plan. Seatbelts: Patient reports wearing seatbelt when in vehicle.  [name] Exposure/Protection: Hobbies: Bowling, computer games, Bingo  Has financial difficulties and transportation issues as she and her husband share transpo   rtation      Social Determinants of Health   Financial Resource Strain: High Risk (01/13/2022)   Overall Financial Resource Strain (CARDIA)    Difficulty of Paying Living Expenses: Hard  Food Insecurity: Not on file  Transportation Needs: No Transportation Needs (09/01/2022)   PRAPARE - Hydrologist (Medical): No    Lack of Transportation (Non-Medical): No  Physical Activity: Not on file  Stress: Not on file  Social Connections: Not on file     Family History:  The patient's family history includes Asthma in her brother and mother; Cancer in her brother, maternal aunt, and mother; Depression in her mother, sister, and sister; Diabetes in her brother, sister, and sister; HIV/AIDS in her brother and sister; Heart attack in her maternal grandmother and sister; Heart attack (age of onset: 35) in her mother; Heart disease in her maternal grandmother and mother; Hyperlipidemia in her  mother, sister, and sister; Hypertension in her brother and mother; Kidney disease in her mother; Stroke in her mother and sister.   ROS:   Please see the history of present illness.    ROS All other systems reviewed and are negative.      No data to display             PHYSICAL EXAM:   VS:  BP (!) 130/58   Pulse 66   Ht '5\' 2"'$  (1.575 m)   Wt 130 lb 12.8 oz (59.3 kg)   SpO2 96%   BMI 23.92 kg/m    GEN: Well nourished, well developed in no acute distress HEENT: Normal NECK: No JVD; No carotid bruits LYMPHATICS: No lymphadenopathy CARDIAC:RRR, no murmurs, rubs, gallops RESPIRATORY:  Clear to auscultation without rales, wheezing or rhonchi  ABDOMEN: Soft, non-tender, non-distended MUSCULOSKELETAL:  No edema; No deformity  SKIN: Warm and dry NEUROLOGIC:  Alert and oriented x 3 PSYCHIATRIC:  Normal affect  Wt Readings from Last 3 Encounters:  01/12/23 130 lb 12.8 oz (59.3 kg)  12/24/22 135 lb 4 oz (61.3 kg)  09/23/22 128 lb (58.1 kg)      Studies/Labs Reviewed:   EKG:  EKG is ordered today and demonstrates NSR and nonspecific ST abnormality  Recent Labs: 07/27/2022: ALT 9 08/30/2022: BUN 10; Creatinine, Ser 1.39; Hemoglobin 12.7; Platelets 146; Potassium 3.7; Sodium 139   Lipid Panel    Component Value Date/Time   CHOL 98 (L) 10/21/2021 0840   TRIG 113 10/21/2021 0840   HDL 37 (L) 10/21/2021 0840   CHOLHDL 2.6 10/21/2021 0840   CHOLHDL 4.3 10/04/2020 0114   VLDL 30 10/04/2020 0114   LDLCALC 40 10/21/2021 0840   LDLDIRECT 62 09/24/2010 2055    Additional studies/ records that were reviewed today include:  OV notes and labs from [facility]:    1. Chronic diastolic CHF (congestive heart failure) ([place])   2. Essential hypertension   3. HYPERCHOLESTEROLEMIA   4. PERIPHERAL VASCULAR DISEASE   5.  Chest pain of uncertain etiology      PLAN:  In order of problems listed above:  1.  Chronic diastolic CHF -She appears euvolemic on exam today -her  last echo 04/2020 showed hyperdynamic LVF with EF 70-75% and moderate LVH and increased filling pressures -I have personally reviewed and interpreted outside labs performed by patient's PCP which showed SCr 1.39, K+ 3.7 on 9/23 -Continue prescription drug management with Lasix 20 mg daily with as needed refills  2.  HTN -Blood pressure is controlled -Continue prescription drug management with amlodipine 10 mg daily with as needed refills  3.  HLD -LDL goal < 70 due to PVD -Check FLP and ALT -Continue prescription drug management with atorvastatin 80 mg daily with as needed refills  4.  PVD -followed by Dr. Arita Miss with Vascular surgery>>she was supposed to followup in 20203 but didn't so I will get her scheduled -US showed occlusion of the bilateral SFA occlusions -continue prescription drug management with Plavix '75mg'$  daily with PRN refills  5.  Chest pain -Lexiscan Myoview 2021 showed no ischemia -She denies any further anginal symptoms    Medication Adjustments/Labs and Tests Ordered: Current medicines are reviewed at length with the patient today.  Concerns regarding medicines are outlined above.  Medication changes, Labs and Tests ordered today are listed in the Patient Instructions below.  There are no Patient Instructions on file for this visit.   Signed, Fransico Him, MD  01/12/2023 1:55 PM    [facility] [place], [place], [place]  [address] Phone: [phone]; Fax: [phone]

## 2023-01-12 NOTE — Addendum Note (Signed)
Addended by: Joni Reining on: 01/12/2023 02:22 PM   Modules accepted: Orders

## 2023-01-12 NOTE — Patient Instructions (Addendum)
Medication Instructions:  Your physician recommends that you continue on your current medications as directed. Please refer to the Current Medication list given to you today.  *If you need a refill on your cardiac medications before your next appointment, please call your pharmacy*   Lab Work: Please complete a fasting lipid profile and ALT.   If you have labs (blood work) drawn today and your tests are completely normal, you will receive your results only by: [place] (if you have MyChart) OR A paper copy in the mail If you have any lab test that is abnormal or we need to change your treatment, we will call you to review the results.   Testing/Procedures: None.   Follow-Up: At [facility], you and your health needs are our priority.  As part of our continuing mission to provide you with exceptional heart care, we have created designated Provider Care Teams.  These Care Teams include your primary Cardiologist (physician) and Advanced Practice Providers (APPs -  Physician Assistants and Nurse Practitioners) who all work together to provide you with the care you need, when you need it.  We recommend signing up for the patient portal called "MyChart".  Sign up information is provided on this After Visit Summary.  MyChart is used to connect with patients for Virtual Visits (Telemedicine).  Patients are able to view lab/test results, encounter notes, upcoming appointments, etc.  Non-urgent messages can be sent to your provider as well.   To learn more about what you can do with MyChart, go to [email or web address].    Your next appointment:   1 year(s)  Provider:   Fransico Him, MD     Other Instructions You have been referred to see Dr. Arita Miss with [facility]. The phone number for his practice is [phone].

## 2023-01-14 ENCOUNTER — Encounter: Payer: 59 | Attending: Physical Medicine and Rehabilitation | Admitting: Physical Medicine and Rehabilitation

## 2023-01-14 ENCOUNTER — Other Ambulatory Visit (HOSPITAL_BASED_OUTPATIENT_CLINIC_OR_DEPARTMENT_OTHER): Payer: Self-pay | Admitting: Cardiology

## 2023-01-14 ENCOUNTER — Ambulatory Visit: Payer: 59 | Attending: Cardiology

## 2023-01-14 ENCOUNTER — Encounter: Payer: Self-pay | Admitting: Physical Medicine and Rehabilitation

## 2023-01-14 VITALS — BP 149/66 | HR 68 | Ht 62.0 in | Wt 133.0 lb

## 2023-01-14 DIAGNOSIS — G8929 Other chronic pain: Secondary | ICD-10-CM | POA: Diagnosis not present

## 2023-01-14 DIAGNOSIS — M4807 Spinal stenosis, lumbosacral region: Secondary | ICD-10-CM | POA: Insufficient documentation

## 2023-01-14 DIAGNOSIS — M545 Low back pain, unspecified: Secondary | ICD-10-CM

## 2023-01-14 MED ORDER — DULOXETINE HCL 60 MG PO CPEP: 60.0000 mg | ORAL_CAPSULE | Freq: Every day | ORAL | 1 refills | Status: DC

## 2023-01-14 NOTE — Progress Notes (Signed)
Subjective:    Patient ID: [name] [name], female    DOB: 1946-07-28, 77 y.o.   MRN: [id number]  HPI  Patient is a 77 yr old female with hx of dCHF, DM 2- A1c 7.3 , COPD?, CLL with prior  WBC >100k- now 7.5, and chronic back and R>>>L foot pain. Has chronic low back and RLE>LLE DM neuropathy. Also has CKD3- Cr 1.39-  Still a smoker- 0.5 ppd.  Here for f/u on Nerve pain  Seen in 2021- with issues above, but now seeing again in 2024-    Still on Low dose of Duloxetine - per computer.   Was falling frequently- since saw me last-  And blacked out and didn't wake back up for awhile. Last year- and had pneumonia-  Forgot who made appointment with me, but came because has been 2.5+ years.   Still having back pain- more on L side now- stays in back. Rates 8/10- more aching pain than anything; no burning or throbbing unless does a lot of walking.  Goes to [employer] 1x/month- has to use shopping cart Has to lean on cart.   Feet pain- nags her when does a lot of walking- has some burning on L foot. On lateral aspect of L foot.  Swelling-  RLE_ wears compression sock-   Still taking Tramadol 50 mg 2x/day - gets Rx from PCP- the resident. Feels like it helps- but sometimes, still painful.   Pain Inventory Average Pain 8 Pain Right Now 8 My pain is aching  In the last 24 hours, has pain interfered with the following? General activity 2 Relation with others 0 Enjoyment of life 4 What TIME of day is your pain at its worst? evening Sleep (in general) Fair  Pain is worse with: walking, bending, and standing Pain improves with: rest and medication Relief from Meds: 7  Family History  Problem Relation Age of Onset   Heart disease Mother    Asthma Mother    Cancer Mother        uterine    Depression Mother    Heart attack Mother 68   Hyperlipidemia Mother    Hypertension Mother    Stroke Mother    Kidney disease Mother    Heart attack Sister    Stroke Sister    Depression  Sister    Diabetes Sister    Hyperlipidemia Sister    Depression Sister    Diabetes Sister    Hyperlipidemia Sister    HIV/AIDS Sister    Diabetes Brother    Asthma Brother    Hypertension Brother    Cancer Maternal Aunt        lung   Heart disease Maternal Grandmother    Heart attack Maternal Grandmother    Cancer Brother        colon   HIV/AIDS Brother    Colon cancer Neg Hx    Social History   Socioeconomic History   Marital status: Single    Spouse name: Not on file   Number of children: 3   Years of education: Not on file   Highest education level: Not on file  Occupational History   Occupation: DISABLED    Employer: DISABLED  Tobacco Use   Smoking status: Every Day    Packs/day: 0.25    Years: 30.00    Total pack years: 7.50    Types: Cigarettes    Start date: 01/01/1976    Passive exposure: Current   Smokeless tobacco:  Never   Tobacco comments:    ~ 3 cigarettes / day  Vaping Use   Vaping Use: Never used  Substance and Sexual Activity   Alcohol use: Not Currently   Drug use: Not Currently   Sexual activity: Yes  Other Topics Concern   Not on file  Social History Narrative   ** Merged History Encounter **       Health Care POA:  Emergency Contact: Val Riles [phone] (c) End of Life Plan:  Who lives with you: Lives with husband Any pets: none Diet: Patient lacks financial resources for much food. Pt reports eating what is available. Exercise: Patient    does not have an exercise plan. Seatbelts: Patient reports wearing seatbelt when in vehicle.  [name] Exposure/Protection: Hobbies: Bowling, computer games, Bingo  Has financial difficulties and transportation issues as she and her husband share transpo   rtation      Social Determinants of Health   Financial Resource Strain: High Risk (01/13/2022)   Overall Financial Resource Strain (CARDIA)    Difficulty of Paying Living Expenses: Hard  Food Insecurity: Not on file  Transportation Needs:  No Transportation Needs (09/01/2022)   PRAPARE - Hydrologist (Medical): No    Lack of Transportation (Non-Medical): No  Physical Activity: Not on file  Stress: Not on file  Social Connections: Not on file   Past Surgical History:  Procedure Laterality Date   ABDOMINAL AORTOGRAM W/LOWER EXTREMITY Bilateral 08/12/2020   Procedure: ABDOMINAL AORTOGRAM W/BILATERAL LOWER EXTREMITY RUNOFF;  Surgeon: Serafina Mitchell, MD;  Location: [facility];  Service: Cardiovascular;  Laterality: Bilateral;   ABI  02/2012   ABI <0.65 BL 02/2012   BRAIN MENINGIOMA EXCISION     BRAIN TUMOR EXCISION     Bypass grafting of RLE for PAD claudication      CARDIAC CATHETERIZATION  04/09/208   CESAREAN SECTION     1974, 78 ,80   CHOLECYSTECTOMY, LAPAROSCOPIC     COLONOSCOPY  2014   [place], [place]   ENDARTERECTOMY FEMORAL Right 10/03/2020   Procedure: RIGHT FEMORAL ENDARTERECTOMY WITH VEIN PATCH ANGIOPLASTY;  Surgeon: Serafina Mitchell, MD;  [facility];  Service: Vascular;  Laterality: Right;   ESOPHAGEAL DILATION     FEMORAL-POPLITEAL BYPASS GRAFT Right 10/03/2020   Procedure: RIGHT FEMORAL- BELOW KNEE POPLITEAL BYPASS USING GORE PROPATEN 6MM GRAFT;  Surgeon: Serafina Mitchell, MD;  Location: [facility];  Service: Vascular;  Laterality: Right;   PATCH ANGIOPLASTY  10/03/2020   Procedure: VEIN PATCH ANGIOPLASTY;  Surgeon: Serafina Mitchell, MD;  [facility];  Service: Vascular;;   Past Surgical History:  Procedure Laterality Date   ABDOMINAL AORTOGRAM W/LOWER EXTREMITY Bilateral 08/12/2020   Procedure: ABDOMINAL AORTOGRAM W/BILATERAL LOWER EXTREMITY RUNOFF;  Surgeon: Serafina Mitchell, MD;  Location: [facility];  Service: Cardiovascular;  Laterality: Bilateral;   ABI  02/2012   ABI <0.65 BL 02/2012   BRAIN MENINGIOMA EXCISION     BRAIN TUMOR EXCISION     Bypass grafting of RLE for PAD claudication      CARDIAC CATHETERIZATION  04/09/208   CESAREAN SECTION     1974, 78 ,80    CHOLECYSTECTOMY, LAPAROSCOPIC     COLONOSCOPY  2014   [place], [place]   ENDARTERECTOMY FEMORAL Right 10/03/2020   Procedure: RIGHT FEMORAL ENDARTERECTOMY WITH VEIN PATCH ANGIOPLASTY;  Surgeon: Serafina Mitchell, MD;  [facility];  Service: Vascular;  Laterality: Right;   ESOPHAGEAL  DILATION     FEMORAL-POPLITEAL BYPASS GRAFT Right 10/03/2020   Procedure: RIGHT FEMORAL- BELOW KNEE POPLITEAL BYPASS USING GORE PROPATEN 6MM GRAFT;  Surgeon: Serafina Mitchell, MD;  Location: [facility];  Service: Vascular;  Laterality: Right;   PATCH ANGIOPLASTY  10/03/2020   Procedure: VEIN PATCH ANGIOPLASTY;  Surgeon: Serafina Mitchell, MD;  [facility];  Service: Vascular;;   Past Medical History:  Diagnosis Date   Allergy    environmental   Asthma    Brain tumor ([place])    Chronic diastolic CHF (congestive heart failure) ([place])    CKD (chronic kidney disease)    CLL (chronic lymphocytic leukemia) ([place])    Constipation 11/15/2011   COPD (chronic obstructive pulmonary disease) ([place])    CVA (cerebral vascular accident) ([place])    Diabetes ([place])    GERD (gastroesophageal reflux disease)    Headache    migraines   Hyperlipidemia    Hypertension    PVD (peripheral vascular disease) ([place])    Stroke ([place]) 1998   in [place] due to tumor-right side   Ht '5\' 2"'$  (1.575 m)   Wt 133 lb (60.3 kg)   BMI 24.33 kg/m   Opioid Risk Score:   Fall Risk Score:  `1  Depression screen [facility] 2/9     12/24/2022    2:49 PM 09/23/2022    1:35 PM 09/02/2022    3:16 PM 08/02/2022    1:57 PM 05/31/2022    1:35 PM 04/28/2022    1:34 PM 04/06/2022    9:59 AM  Depression screen [facility] 2/9  Decreased Interest 0 0 0 0 2 2 0  Down, Depressed, Hopeless 0 0 0 0  1 2  PHQ - 2 Score 0 0 0 0 '2 3 2  '$ Altered sleeping 0 0  0 '1 1 2  '$ Tired, decreased energy 0 '2 2 1 2 2   '$ Change in appetite 0 0 0 0 0 0 0  Feeling bad or failure about yourself  0 0 0 0 0 0 1  Trouble concentrating 0 0 0 0 0 0 0  Moving slowly or fidgety/restless 0 0 0 0 0 0 2   Suicidal thoughts 0 0 0 0 0 0   PHQ-9 Score 0 '2  1 5 6 7      '$ Review of Systems  Musculoskeletal:  Positive for back pain.  All other systems reviewed and are negative.     Objective:   Physical Exam Awake, alert, unaccompanied today; using single point cane to walk, NAD Forward flexed position to walk- at lumbar spine  Very TTP over L lumbar paraspinals- around L4- S1 on L Jumps for the sky No TTP on midline or R side Mild improvement in pain with flexion of spine, but worse with rotation or extension of spine.        Assessment & Plan:   Patient is a 77 yr old female with hx of dCHF, DM 2- A1c 7.3 , COPD?, CLL with WBC down to 7.5  and chronic back and R>>>L foot pain. Has chronic low back and RLE>LLE DM neuropathy. Also has CKD3- Cr 1.39-  Still a smoker- 0.5 ppd.  Here for f/u on Nerve pain, and back pain- has moderate foraminal stenosis L5/S1, but no spinal stenosis.   Seen in 2021- with issues above, but now seeing again in 2024-   Working on quitting smoking- taking Chantix- last cigarette yesterday- trying to reduce them- pack lasts 6-7 days. Down to  3 cigarettes/day- loves it!  2.   Has foraminal stenosis- on L L4-L5- and  L5/S1- will try to Increase Duloxetine-   3. Increase Duloxetine to 60 mg daily- for nerve and back pain- #90- 1 refill- sent to [facility]- Cr 1.39- should be able to do 60 mg daily.   4. Still on Gabapentin 600 mg QHS- was on BID- but decreased due to kidney function-  Was done awhile ago.   5. Con't tramadol per PCP-   6. F/U in 3 months-    I spent a total of   22 minutes on total care today- >50% coordination of care- due to discussion of back pain/nerve pain- will increase Duloxetine- needed to catch up- been almost 3 years.

## 2023-01-14 NOTE — Patient Instructions (Signed)
Patient is a 77 yr old female with hx of dCHF, DM 2- A1c 7.3 , COPD?, CLL with WBC down to 7.5  and chronic back and R>>>L foot pain. Has chronic low back and RLE>LLE DM neuropathy. Also has CKD3- Cr 1.39-  Still a smoker- 0.5 ppd.  Here for f/u on Nerve pain, and back pain- has moderate foraminal stenosis L5/S1, but no spinal stenosis.   Seen in 2021- with issues above, but now seeing again in 2024-   Working on quitting smoking- taking Chantix- last cigarette yesterday- trying to reduce them- pack lasts 6-7 days. Down to 3 cigarettes/day- loves it!  2.   Has foraminal stenosis- on L L4-L5- and  L5/S1- will try to Increase Duloxetine-   3. Increase Duloxetine to 60 mg daily- for nerve and back pain- #90- 1 refill- sent to [facility]- Cr 1.39- should be able to do 60 mg daily.   4. Still on Gabapentin 600 mg QHS- was on BID- but decreased due to kidney function-  Was done awhile ago.   5. Con't tramadol per PCP-   6. F/U in 3 months-

## 2023-01-15 LAB — LIPID PANEL
Chol/HDL Ratio: 2.5 ratio (ref 0.0–4.4)
Cholesterol, Total: 105 mg/dL (ref 100–199)
HDL: 42 mg/dL (ref >39)
LDL Chol Calc (NIH): 43 mg/dL (ref 0–99)
Triglycerides: 106 mg/dL (ref 0–149)
VLDL Cholesterol Cal: 20 mg/dL (ref 5–40)

## 2023-01-15 LAB — ALT: ALT: 19 IU/L (ref 0–32)

## 2023-01-21 ENCOUNTER — Ambulatory Visit: Payer: 59 | Admitting: Cardiovascular Disease

## 2023-01-23 ENCOUNTER — Other Ambulatory Visit (HOSPITAL_BASED_OUTPATIENT_CLINIC_OR_DEPARTMENT_OTHER): Payer: Self-pay | Admitting: Cardiology

## 2023-01-23 ENCOUNTER — Other Ambulatory Visit: Payer: Self-pay | Admitting: Family Medicine

## 2023-01-23 ENCOUNTER — Other Ambulatory Visit: Payer: Self-pay | Admitting: Physical Medicine and Rehabilitation

## 2023-01-23 DIAGNOSIS — J449 Chronic obstructive pulmonary disease, unspecified: Secondary | ICD-10-CM

## 2023-01-27 ENCOUNTER — Other Ambulatory Visit: Payer: Self-pay

## 2023-01-27 DIAGNOSIS — C9110 Chronic lymphocytic leukemia of B-cell type not having achieved remission: Secondary | ICD-10-CM

## 2023-01-28 ENCOUNTER — Inpatient Hospital Stay: Payer: 59 | Attending: Hematology | Admitting: Hematology

## 2023-01-28 ENCOUNTER — Inpatient Hospital Stay: Payer: 59

## 2023-01-28 ENCOUNTER — Other Ambulatory Visit: Payer: Self-pay

## 2023-01-28 VITALS — BP 128/63 | HR 64 | Temp 97.8°F | Resp 18 | Ht 62.0 in | Wt 132.6 lb

## 2023-01-28 DIAGNOSIS — F17210 Nicotine dependence, cigarettes, uncomplicated: Secondary | ICD-10-CM | POA: Insufficient documentation

## 2023-01-28 DIAGNOSIS — C9110 Chronic lymphocytic leukemia of B-cell type not having achieved remission: Secondary | ICD-10-CM

## 2023-01-28 DIAGNOSIS — Z79899 Other long term (current) drug therapy: Secondary | ICD-10-CM | POA: Insufficient documentation

## 2023-01-28 DIAGNOSIS — Z9181 History of falling: Secondary | ICD-10-CM | POA: Insufficient documentation

## 2023-01-28 LAB — CMP (CANCER CENTER ONLY)
ALT: 16 U/L (ref 0–44)
AST: 18 U/L (ref 15–41)
Albumin: 3.9 g/dL (ref 3.5–5.0)
Alkaline Phosphatase: 156 U/L — ABNORMAL HIGH (ref 38–126)
Anion gap: 7 (ref 5–15)
BUN: 9 mg/dL (ref 8–23)
CO2: 31 mmol/L (ref 22–32)
Calcium: 9.7 mg/dL (ref 8.9–10.3)
Chloride: 102 mmol/L (ref 98–111)
Creatinine: 1.45 mg/dL — ABNORMAL HIGH (ref 0.44–1.00)
GFR, Estimated: 38 mL/min — ABNORMAL LOW (ref >60)
Glucose, Bld: 163 mg/dL — ABNORMAL HIGH (ref 70–99)
Potassium: 4.2 mmol/L (ref 3.5–5.1)
Sodium: 140 mmol/L (ref 135–145)
Total Bilirubin: 0.7 mg/dL (ref 0.3–1.2)
Total Protein: 6.2 g/dL — ABNORMAL LOW (ref 6.5–8.1)

## 2023-01-28 LAB — CBC WITH DIFFERENTIAL (CANCER CENTER ONLY)
Abs Immature Granulocytes: 0.03 10*3/uL (ref 0.00–0.07)
Basophils Absolute: 0.1 10*3/uL (ref 0.0–0.1)
Basophils Relative: 1 %
Eosinophils Absolute: 0.2 10*3/uL (ref 0.0–0.5)
Eosinophils Relative: 2 %
HCT: 43 % (ref 36.0–46.0)
Hemoglobin: 13.7 g/dL (ref 12.0–15.0)
Immature Granulocytes: 0 %
Lymphocytes Relative: 36 %
Lymphs Abs: 2.6 10*3/uL (ref 0.7–4.0)
MCH: 28.1 pg (ref 26.0–34.0)
MCHC: 31.9 g/dL (ref 30.0–36.0)
MCV: 88.1 fL (ref 80.0–100.0)
Monocytes Absolute: 1.2 10*3/uL — ABNORMAL HIGH (ref 0.1–1.0)
Monocytes Relative: 16 %
Neutro Abs: 3.2 10*3/uL (ref 1.7–7.7)
Neutrophils Relative %: 45 %
Platelet Count: 154 10*3/uL (ref 150–400)
RBC: 4.88 MIL/uL (ref 3.87–5.11)
RDW: 17.9 % — ABNORMAL HIGH (ref 11.5–15.5)
WBC Count: 7.2 10*3/uL (ref 4.0–10.5)
nRBC: 0 % (ref 0.0–0.2)

## 2023-01-28 LAB — LACTATE DEHYDROGENASE: LDH: 150 U/L (ref 98–192)

## 2023-01-28 NOTE — Progress Notes (Signed)
HEMATOLOGY/ONCOLOGY CLINIC NOTE  Date of Service: 01/28/23    Patient Care Team: Alcus Dad, MD as PCP - General (Family Medicine) Sueanne Margarita, MD as PCP - Cardiology (Cardiology) Danna Hefty, DO (Family Medicine) Lazaro Arms, RN as Case Manager  CHIEF COMPLAINTS/PURPOSE OF CONSULTATION:  Follow-up for continued evaluation and management of CLL  HISTORY OF PRESENTING ILLNESS:  Please see previous note for details on initial presentation  INTERVAL HISTORY:  [name] [name] is here for continued evaluation and management of CLL.    Patient was last seen by me on 07/27/22 and was doing well overall with no new medical concerns.  Today, she is accompanied by a family member. She complains of abdominal pain that occurred last night. She feels tenderness on her abdomin upon palpation.  She denies any fever, chills, night sweats, recent infections, breathing issues, loss of appetite, sudden weight loss, new lumps/bumps, constipation or change in bowel habits.  She confirms that she had a fall in September fall that caused her to present to the hospital. She FUs with her cardiologist for her heart failure and her PCP for her back pain. She reports that she has received the influenza vaccination. She reports that she will have eye surgery soon.  MEDICAL HISTORY:  Past Medical History:  Diagnosis Date   Allergy    environmental   Asthma    Brain tumor ([place])    Chronic diastolic CHF (congestive heart failure) (HCC)    CKD (chronic kidney disease)    CLL (chronic lymphocytic leukemia) (HCC)    Constipation 11/15/2011   COPD (chronic obstructive pulmonary disease) (HCC)    CVA (cerebral vascular accident) ([place])    Diabetes ([place])    GERD (gastroesophageal reflux disease)    Headache    migraines   Hyperlipidemia    Hypertension    PVD (peripheral vascular disease) ([place])    Stroke ([place]) 1998   in 1998 due to tumor-right side    SURGICAL HISTORY: Past  Surgical History:  Procedure Laterality Date   ABDOMINAL AORTOGRAM W/LOWER EXTREMITY Bilateral 08/12/2020   Procedure: ABDOMINAL AORTOGRAM W/BILATERAL LOWER EXTREMITY RUNOFF;  Surgeon: Serafina Mitchell, MD;  Location: [facility];  Service: Cardiovascular;  Laterality: Bilateral;   ABI  02/2012   ABI <0.65 BL 02/2012   BRAIN MENINGIOMA EXCISION     BRAIN TUMOR EXCISION     Bypass grafting of RLE for PAD claudication      CARDIAC CATHETERIZATION  04/09/208   CESAREAN SECTION     1974, 78 ,80   CHOLECYSTECTOMY, LAPAROSCOPIC     COLONOSCOPY  2014   [place], [place]   ENDARTERECTOMY FEMORAL Right 10/03/2020   Procedure: RIGHT FEMORAL ENDARTERECTOMY WITH VEIN PATCH ANGIOPLASTY;  Surgeon: Serafina Mitchell, MD;  Location: [facility];  Service: Vascular;  Laterality: Right;   ESOPHAGEAL DILATION     FEMORAL-POPLITEAL BYPASS GRAFT Right 10/03/2020   Procedure: RIGHT FEMORAL- BELOW KNEE POPLITEAL BYPASS USING GORE PROPATEN 6MM GRAFT;  Surgeon: Serafina Mitchell, MD;  Location: [facility];  Service: Vascular;  Laterality: Right;   PATCH ANGIOPLASTY  10/03/2020   Procedure: VEIN PATCH ANGIOPLASTY;  Surgeon: Serafina Mitchell, MD;  Location: [facility];  Service: Vascular;;    SOCIAL HISTORY: Social History   Socioeconomic History   Marital status: Single    Spouse name: Not on file   Number of children: 3   Years of education: Not on file   Highest education level: Not  on file  Occupational History   Occupation: DISABLED    Employer: DISABLED  Tobacco Use   Smoking status: Every Day    Packs/day: 0.25    Years: 30.00    Total pack years: 7.50    Types: Cigarettes    Start date: 01/01/1976    Passive exposure: Current   Smokeless tobacco: Never   Tobacco comments:    ~ 3 cigarettes / day  Vaping Use   Vaping Use: Never used  Substance and Sexual Activity   Alcohol use: Not Currently   Drug use: Not Currently   Sexual activity: Yes  Other Topics Concern   Not on file  Social History Narrative    ** Merged History Encounter **       Health Care POA:  Emergency Contact: Val Riles [phone] (c) End of Life Plan:  Who lives with you: Lives with husband Any pets: none Diet: Patient lacks financial resources for much food. Pt reports eating what is available. Exercise: Patient    does not have an exercise plan. Seatbelts: Patient reports wearing seatbelt when in vehicle.  [name] Exposure/Protection: Hobbies: Bowling, computer games, Bingo  Has financial difficulties and transportation issues as she and her husband share transpo   rtation      Social Determinants of Health   Financial Resource Strain: High Risk (01/13/2022)   Overall Financial Resource Strain (CARDIA)    Difficulty of Paying Living Expenses: Hard  Food Insecurity: Not on file  Transportation Needs: No Transportation Needs (09/01/2022)   PRAPARE - Hydrologist (Medical): No    Lack of Transportation (Non-Medical): No  Physical Activity: Not on file  Stress: Not on file  Social Connections: Not on file  Intimate Partner Violence: Not on file    FAMILY HISTORY: Family History  Problem Relation Age of Onset   Heart disease Mother    Asthma Mother    Cancer Mother        uterine    Depression Mother    Heart attack Mother 47   Hyperlipidemia Mother    Hypertension Mother    Stroke Mother    Kidney disease Mother    Heart attack Sister    Stroke Sister    Depression Sister    Diabetes Sister    Hyperlipidemia Sister    Depression Sister    Diabetes Sister    Hyperlipidemia Sister    HIV/AIDS Sister    Diabetes Brother    Asthma Brother    Hypertension Brother    Cancer Maternal Aunt        lung   Heart disease Maternal Grandmother    Heart attack Maternal Grandmother    Cancer Brother        colon   HIV/AIDS Brother    Colon cancer Neg Hx     ALLERGIES:  is allergic to gazyva [obinutuzumab], aspirin, no healthtouch food allergies, aspirin, and  baclofen.  MEDICATIONS:  Current Outpatient Medications  Medication Sig Dispense Refill   acetaminophen (TYLENOL) 500 MG tablet Take 500-1,000 mg by mouth every 6 (six) hours as needed for moderate pain.     albuterol (PROVENTIL) (2.5 MG/3ML) 0.083% nebulizer solution Inhale 1 vial via nebulizer every 6 hours for wheezing or shortness of breath as needed 75 mL 11   albuterol (VENTOLIN HFA) 108 (90 Base) MCG/ACT inhaler Inhale 2 puffs by mouth every 6 hours as needed for wheezing or shortness of breath 8.5 each 11   Alcohol  Swabs (ALCOHOL PADS) 70 % PADS      amLODipine (NORVASC) 10 MG tablet Take 1 tablet by mouth every day 30 tablet 11   atorvastatin (LIPITOR) 80 MG tablet Take 1 tablet by mouth daily. Need appt 30 tablet 11   Blood Glucose Calibration (OT ULTRA/FASTTK CNTRL SOLN) SOLN See admin instructions.     blood glucose meter kit and supplies KIT Dispense based on patient and insurance preference. Use up to four times daily as directed. 1 each 0   Blood Glucose Monitoring Suppl (ACCU-CHEK GUIDE ME) w/Device KIT Use to test blood sugar twice per day. E11.9 1 kit 1   BREO ELLIPTA 100-25 MCG/ACT AEPB Inhale 1 puff by mouth every day 60 each 11   clopidogrel (PLAVIX) 75 MG tablet Take 1 tablet by mouth every day 30 tablet 11   cyclobenzaprine (FLEXERIL) 5 MG tablet Take 1 tablet (5 mg total) by mouth 2 (two) times daily as needed for muscle spasms. 30 tablet 0   DULoxetine (CYMBALTA) 60 MG capsule Take 1 capsule (60 mg total) by mouth daily. 90 capsule 1   famotidine (PEPCID) 20 MG tablet Take 1 tablet by mouth twice daily 60 tablet 11   furosemide (LASIX) 20 MG tablet Take 1 tablet by mouth daily. Need appt 30 tablet 11   gabapentin (NEURONTIN) 300 MG capsule Take 2 capsules by mouth twice daily. Need MD appt 120 capsule 11   glucose blood (ONETOUCH VERIO) test strip Use to check blood sugar once a day 100 each 12   iron polysaccharides (NIFEREX) 150 MG capsule Take 1 capsule (150 mg  total) by mouth daily. 30 capsule 5   JARDIANCE 25 MG TABS tablet Take 1 tablet by mouth every day 30 tablet 11   Lancet Devices (EASY MINI EJECT LANCING DEVICE) MISC daily.     metFORMIN (GLUCOPHAGE) 500 MG tablet Take 1 tablet by mouth every day 30 tablet 11   montelukast (SINGULAIR) 10 MG tablet Take 1 tablet by mouth at bedtime 30 tablet 11   nitroGLYCERIN (NITROSTAT) 0.4 MG SL tablet Dissolve 1 tablet under tongue every 5 minutes, up to 3 doses for chest pain 25 tablet 11   OneTouch Delica Lancets 22W MISC Use to check blood sugars 2x per day. E11.9 100 each 0   traMADol (ULTRAM) 50 MG tablet Take 1 tablet by mouth twice daily as needed for severe pain 60 tablet 0   Varenicline Tartrate, Starter, 0.5 MG X 11 & 1 MG X 42 TBPK 0.5 mg orally once daily for 3 days, then 0.5 mg twice daily on days 4 through 7, and then 1 mg twice daily thereafter 53 each 0   venetoclax (VENCLEXTA) 100 MG tablet Take 2 tablets (200 mg total) by mouth daily. Tablets should be swallowed whole with a meal and a full glass of water. 60 tablet 2   No current facility-administered medications for this visit.    REVIEW OF SYSTEMS:    10 Point review of Systems was done is negative except as noted above.   PHYSICAL EXAMINATION: ECOG FS:2 - Symptomatic, <50% confined to bed  There were no vitals filed for this visit.   Wt Readings from Last 3 Encounters:  01/28/23 132 lb 9.6 oz (60.1 kg)  01/14/23 133 lb (60.3 kg)  01/12/23 130 lb 12.8 oz (59.3 kg)   Body mass index is 24.25 kg/m.     GENERAL:alert, in no acute distress and comfortable SKIN: no acute rashes, no significant lesions EYES:  conjunctiva are pink and non-injected, sclera anicteric OROPHARYNX: MMM, no exudates, no oropharyngeal erythema or ulceration NECK: supple, no JVD LYMPH:  no palpable lymphadenopathy in the cervical, axillary or inguinal regions LUNGS: clear to auscultation b/l with normal respiratory effort HEART: regular rate &  rhythm ABDOMEN:  normoactive bowel sounds , non tender, not distended. Extremity: no pedal edema PSYCH: alert & oriented x 3 with fluent speech NEURO: no focal motor/sensory deficits    LABORATORY DATA:  I have reviewed the data as listed      Latest Ref Rng & Units 01/28/2023    9:05 AM 08/30/2022   11:51 PM 08/30/2022    8:24 PM  CBC  WBC 4.0 - 10.5 K/uL 7.2  7.5    Hemoglobin 12.0 - 15.0 g/dL 13.7  12.7  15.3   Hematocrit 36.0 - 46.0 % 43.0  40.7  45.0   Platelets 150 - 400 K/uL 154  146         Latest Ref Rng & Units 01/28/2023    9:05 AM 01/14/2023   11:02 AM 08/30/2022    8:40 PM  CMP  Glucose 70 - 99 mg/dL 163   135   BUN 8 - 23 mg/dL 9   10   Creatinine 0.44 - 1.00 mg/dL 1.45   1.39   Sodium 135 - 145 mmol/L 140   139   Potassium 3.5 - 5.1 mmol/L 4.2   3.7   Chloride 98 - 111 mmol/L 102   101   CO2 22 - 32 mmol/L 31   27   Calcium 8.9 - 10.3 mg/dL 9.7   9.7   Total Protein 6.5 - 8.1 g/dL 6.2     Total Bilirubin 0.3 - 1.2 mg/dL 0.7     Alkaline Phos 38 - 126 U/L 156     AST 15 - 41 U/L 18     ALT 0 - 44 U/L 16  19      Lab Results  Component Value Date   LDH 150 01/28/2023         . Lab Results  Component Value Date   LDH 138 07/27/2022    RADIOGRAPHIC STUDIES: I have personally reviewed the radiological images as listed and agreed with the findings in the report. No results found.  Surgical Pathology 12/13/2017   ASSESSMENT & PLAN:   [name] [name] is a wonderful 77 y.o. female with    1. Rai 1 Chronic Lymphocytic Leukemia  - Trisomy 12 mutation. No associated thrombocytopenia.  Mild anemia - likely from acute blood loss from significant epistaxis.  Now resolved No constitutional symptoms. Minimal LNaednopathy No splenomegaly  #2 .Lung nodules on CT in 12/2017. Rpt CT chest 08/09/2018 - stable  #3 Patient Active Problem List   Diagnosis Date Noted   Foraminal stenosis of lumbosacral region 01/14/2023   Right leg swelling 09/03/2022    Meningioma ([place]) 09/03/2022   Polypharmacy 04/07/2022   History of home oxygen therapy 11/06/2021   Iron deficiency anemia 02/12/2021   Peripheral arterial disease ([place]) 10/03/2020   (HFpEF) heart failure with preserved ejection fraction ([place]) 06/11/2020   Venous insufficiency (chronic) (peripheral) 06/22/2019   Pulmonary nodules 01/04/2018   Mass of left lower leg 01/04/2018   Chronic kidney disease (CKD), stage III (moderate) ([place]) 06/04/2016   Chronic back pain 10/02/2015   DM (diabetes mellitus), type 2 with peripheral vascular complications ([place]) 32/44/0102   Chronic lymphocytic leukemia ([place]) 04/11/2009   PERIPHERAL VASCULAR DISEASE 10/13/2007  HYPERCHOLESTEROLEMIA 02/23/2007   TOBACCO DEPENDENCE 02/23/2007   Essential hypertension 02/23/2007   COPD (chronic obstructive pulmonary disease) ([place]) 02/23/2007   GASTROESOPHAGEAL REFLUX, NO ESOPHAGITIS 02/23/2007    PLAN: -Patient has no new clinical signs or symptoms of CLL progression  -Discussed lab results on 01/28/23  with patient. Blood counts normal with no lymphocytosis. CBC showed WBC of 7.2 K, hemoglobin of 13.7, and platelets of 154 K. -LDH and CMP reviewed. -recommended patient to receive RSV and COVID-19 booster vaccination -Advised pt to confirm that she is UTD with pneumonia vaccine with PCP -Recommended pt to receive shinglex vaccination -Connected patient with social worker  FOLLOW UP: Return to clinic with Dr. Irene Limbo with labs in 6 months  The total time spent in the appointment was 25 minutes* .  All of the patient's questions were answered with apparent satisfaction. The patient knows to call the clinic with any problems, questions or concerns.   Sullivan Lone MD MS AAHIVMS [facility] Hematology/Oncology Physician [facility]  .*Total Encounter Time as defined by the Centers for Medicare and Medicaid Services includes, in addition to the face-to-face time of a patient visit (documented in the note  above) non-face-to-face time: obtaining and reviewing outside history, ordering and reviewing medications, tests or procedures, care coordination (communications with other health care professionals or caregivers) and documentation in the medical record.   I,Mitra Faeizi,acting as a Education administrator for Sullivan Lone, MD.,have documented all relevant documentation on the behalf of Sullivan Lone, MD,as directed by  Sullivan Lone, MD while in the presence of Sullivan Lone, MD.  .I have reviewed the above documentation for accuracy and completeness, and I agree with the above. Brunetta Genera MD

## 2023-02-01 ENCOUNTER — Other Ambulatory Visit: Payer: Self-pay | Admitting: Family Medicine

## 2023-02-01 DIAGNOSIS — M545 Low back pain, unspecified: Secondary | ICD-10-CM

## 2023-02-03 ENCOUNTER — Other Ambulatory Visit: Payer: Self-pay | Admitting: *Deleted

## 2023-02-03 ENCOUNTER — Encounter: Payer: Self-pay | Admitting: Hematology

## 2023-02-03 DIAGNOSIS — I739 Peripheral vascular disease, unspecified: Secondary | ICD-10-CM

## 2023-02-03 DIAGNOSIS — I70211 Atherosclerosis of native arteries of extremities with intermittent claudication, right leg: Secondary | ICD-10-CM

## 2023-02-10 ENCOUNTER — Inpatient Hospital Stay: Payer: 59

## 2023-02-10 NOTE — Progress Notes (Signed)
[place] CSW Progress Note  Clinical Education officer, museum contacted patient by phone to assess needs.  She stated she was having eye surgery and needed over $300 for each eye.  Confirmed patient insurance.  Suggested she contact the MD requesting the eye surgery and ask about the cost since she has insurance.  She stated she understood.  She expressed no other needs.    Rodman Pickle Odis Turck, LCSW

## 2023-02-11 ENCOUNTER — Ambulatory Visit (INDEPENDENT_AMBULATORY_CARE_PROVIDER_SITE_OTHER): Payer: 59

## 2023-02-11 DIAGNOSIS — Z23 Encounter for immunization: Secondary | ICD-10-CM | POA: Diagnosis not present

## 2023-02-11 NOTE — Progress Notes (Signed)
Patient presents to nurse clinic for [place] vaccination. Administered in LD, site unremarkable, patient tolerated injection well.   Observed patient for 15 minutes post injection. No signs of adverse reaction noted.   Provided patient with updated copy of immunization record.   Patient is asking if she is due for pneumonia vaccine. Patient has received prevnar 13 and 3 doses of pneumovax. Precepted with Dr. Ardelia Mems who verified that patient did not need additional pneumonia vaccination.   Talbot Grumbling, RN

## 2023-02-21 ENCOUNTER — Ambulatory Visit (HOSPITAL_COMMUNITY)
Admission: RE | Admit: 2023-02-21 | Discharge: 2023-02-21 | Disposition: A | Discharge status: Home / Self Care | Payer: 59 | Source: Ambulatory Visit | Attending: Surgery | Admitting: Surgery

## 2023-02-21 ENCOUNTER — Ambulatory Visit (INDEPENDENT_AMBULATORY_CARE_PROVIDER_SITE_OTHER): Payer: 59 | Admitting: Physician Assistant

## 2023-02-21 ENCOUNTER — Ambulatory Visit (INDEPENDENT_AMBULATORY_CARE_PROVIDER_SITE_OTHER)
Admission: RE | Admit: 2023-02-21 | Discharge: 2023-02-21 | Disposition: A | Discharge status: Home / Self Care | Payer: 59 | Source: Ambulatory Visit | Attending: Surgery | Admitting: Surgery

## 2023-02-21 VITALS — BP 162/84 | HR 107 | Temp 98.6°F | Resp 20 | Ht 62.0 in | Wt 135.4 lb

## 2023-02-21 DIAGNOSIS — I739 Peripheral vascular disease, unspecified: Secondary | ICD-10-CM

## 2023-02-21 DIAGNOSIS — I70211 Atherosclerosis of native arteries of extremities with intermittent claudication, right leg: Secondary | ICD-10-CM | POA: Insufficient documentation

## 2023-02-21 LAB — VAS US ABI WITH/WO TBI
Left ABI: 0.72
Right ABI: 0.91

## 2023-02-21 NOTE — Progress Notes (Signed)
[facility] [place] HISTORY AND PHYSICAL   History of Present Illness:  Patient is a 77 y.o. year old female who presents for evaluation of  PAD.  She has history of  right femoral endarterectomy with vein patch angioplasty and right femoral to below the knee popliteal artery bypass with Gore-Tex by Dr. Trula Slade on 10/04/2020.   She is also had history of left lower extremity endovascular intervention in [place].  She has had edema of right lower extremity since surgery, which is managed with compression and elevation.   She has known left lower extremity SFA occlusion.  She denies any claudication, rest pain, or nonhealing wounds.  She states her symptoms have not changed.  She states stairs increased her discomfort on the left LE.  She denise rest pain or non healing wounds.   The pt is on a statin for cholesterol management.  The pt is not on a daily aspirin.   Other AC:  Plavix The pt is on CCB for hypertension.   The pt is diabetic   Past Medical History:  Diagnosis Date   Allergy    environmental   Asthma    Brain tumor ([place])    Chronic diastolic CHF (congestive heart failure) (HCC)    CKD (chronic kidney disease)    CLL (chronic lymphocytic leukemia) (HCC)    Constipation 11/15/2011   COPD (chronic obstructive pulmonary disease) (HCC)    CVA (cerebral vascular accident) ([place])    Diabetes ([place])    GERD (gastroesophageal reflux disease)    Headache    migraines   Hyperlipidemia    Hypertension    PVD (peripheral vascular disease) ([place])    Stroke ([place]) 1998   in 1998 due to tumor-right side    Past Surgical History:  Procedure Laterality Date   ABDOMINAL AORTOGRAM W/LOWER EXTREMITY Bilateral 08/12/2020   Procedure: ABDOMINAL AORTOGRAM W/BILATERAL LOWER EXTREMITY RUNOFF;  Surgeon: Serafina Mitchell, MD;  Location: [facility];  Service: Cardiovascular;  Laterality: Bilateral;   ABI  02/2012   ABI <0.65 BL 02/2012   BRAIN MENINGIOMA EXCISION      BRAIN TUMOR EXCISION     Bypass grafting of RLE for PAD claudication      CARDIAC CATHETERIZATION  04/09/208   CESAREAN SECTION     1974, 78 ,80   CHOLECYSTECTOMY, LAPAROSCOPIC     COLONOSCOPY  2014   [place], [place]   ENDARTERECTOMY FEMORAL Right 10/03/2020   Procedure: RIGHT FEMORAL ENDARTERECTOMY WITH VEIN PATCH ANGIOPLASTY;  Surgeon: Serafina Mitchell, MD;  Location: [facility];  Service: Vascular;  Laterality: Right;   ESOPHAGEAL DILATION     FEMORAL-POPLITEAL BYPASS GRAFT Right 10/03/2020   Procedure: RIGHT FEMORAL- BELOW KNEE POPLITEAL BYPASS USING GORE PROPATEN 6MM GRAFT;  Surgeon: Serafina Mitchell, MD;  Location: [facility];  Service: Vascular;  Laterality: Right;   PATCH ANGIOPLASTY  10/03/2020   Procedure: VEIN PATCH ANGIOPLASTY;  Surgeon: Serafina Mitchell, MD;  Location: [facility];  Service: Vascular;;    ROS:   General:  No weight loss, Fever, chills  HEENT: No recent headaches, no nasal bleeding, no visual changes, no sore throat  Neurologic: No dizziness, blackouts, seizures. No recent symptoms of stroke or mini- stroke. No recent episodes of slurred speech, or temporary blindness.  Cardiac: No recent episodes of chest pain/pressure, no shortness of breath at rest.  No shortness of breath with exertion.  Denies history of atrial fibrillation or irregular heartbeat  Vascular: No history  of rest pain in feet.  No history of claudication.  No history of non-healing ulcer, No history of DVT   Pulmonary: No home oxygen, no productive cough, no hemoptysis,  No asthma or wheezing  Musculoskeletal:  '[ ]'$  Arthritis, '[ ]'$  Low back pain,  '[ ]'$  Joint pain  Hematologic:No history of hypercoagulable state.  No history of easy bleeding.  No history of anemia  Gastrointestinal: No hematochezia or melena,  No gastroesophageal reflux, no trouble swallowing  Urinary: '[ ]'$  chronic Kidney disease, '[ ]'$  on HD - '[ ]'$  MWF or '[ ]'$  TTHS, '[ ]'$  Burning with urination, '[ ]'$  Frequent urination, '[ ]'$  Difficulty  urinating;   Skin: No rashes  Psychological: No history of anxiety,  No history of depression  Social History Social History   Tobacco Use   Smoking status: Every Day    Packs/day: 0.25    Years: 30.00    Total pack years: 7.50    Types: Cigarettes    Start date: 01/01/1976    Passive exposure: Current   Smokeless tobacco: Never   Tobacco comments:    ~ 3 cigarettes / day  Vaping Use   Vaping Use: Never used  Substance Use Topics   Alcohol use: Not Currently   Drug use: Not Currently    Family History Family History  Problem Relation Age of Onset   Heart disease Mother    Asthma Mother    Cancer Mother        uterine    Depression Mother    Heart attack Mother 37   Hyperlipidemia Mother    Hypertension Mother    Stroke Mother    Kidney disease Mother    Heart attack Sister    Stroke Sister    Depression Sister    Diabetes Sister    Hyperlipidemia Sister    Depression Sister    Diabetes Sister    Hyperlipidemia Sister    HIV/AIDS Sister    Diabetes Brother    Asthma Brother    Hypertension Brother    Cancer Maternal Aunt        lung   Heart disease Maternal Grandmother    Heart attack Maternal Grandmother    Cancer Brother        colon   HIV/AIDS Brother    Colon cancer Neg Hx     Allergies  Allergies  Allergen Reactions   Gazyva [Obinutuzumab] Shortness Of Breath    Acute respiratory distress   Aspirin     Abdominal pain.    No [facility] Food Allergies Diarrhea and Nausea And Vomiting    Cabbage, stomach pain    Aspirin Other (See Comments)    irritates stomach   Baclofen Other (See Comments)    Stomach irritation     Current Outpatient Medications  Medication Sig Dispense Refill   acetaminophen (TYLENOL) 500 MG tablet Take 500-1,000 mg by mouth every 6 (six) hours as needed for moderate pain.     albuterol (PROVENTIL) (2.5 MG/3ML) 0.083% nebulizer solution Inhale 1 vial via nebulizer every 6 hours for wheezing or shortness of breath  as needed 75 mL 11   albuterol (VENTOLIN HFA) 108 (90 Base) MCG/ACT inhaler Inhale 2 puffs by mouth every 6 hours as needed for wheezing or shortness of breath 8.5 each 11   Alcohol Swabs (ALCOHOL PADS) 70 % PADS      amLODipine (NORVASC) 10 MG tablet Take 1 tablet by mouth every day 30 tablet 11   atorvastatin (  LIPITOR) 80 MG tablet Take 1 tablet by mouth daily. Need appt 30 tablet 11   Blood Glucose Calibration (OT ULTRA/FASTTK CNTRL SOLN) SOLN See admin instructions.     blood glucose meter kit and supplies KIT Dispense based on patient and insurance preference. Use up to four times daily as directed. 1 each 0   Blood Glucose Monitoring Suppl (ACCU-CHEK GUIDE ME) w/Device KIT Use to test blood sugar twice per day. E11.9 1 kit 1   BREO ELLIPTA 100-25 MCG/ACT AEPB Inhale 1 puff by mouth every day 60 each 11   clopidogrel (PLAVIX) 75 MG tablet Take 1 tablet by mouth every day 30 tablet 11   cyclobenzaprine (FLEXERIL) 5 MG tablet Take 1 tablet (5 mg total) by mouth 2 (two) times daily as needed for muscle spasms. 30 tablet 0   DULoxetine (CYMBALTA) 60 MG capsule Take 1 capsule (60 mg total) by mouth daily. 90 capsule 1   famotidine (PEPCID) 20 MG tablet Take 1 tablet by mouth twice daily 60 tablet 11   furosemide (LASIX) 20 MG tablet Take 1 tablet by mouth daily. Need appt 30 tablet 11   gabapentin (NEURONTIN) 300 MG capsule Take 2 capsules by mouth twice daily. Need MD appt 120 capsule 11   glucose blood (ONETOUCH VERIO) test strip Use to check blood sugar once a day 100 each 12   iron polysaccharides (NIFEREX) 150 MG capsule Take 1 capsule (150 mg total) by mouth daily. 30 capsule 5   JARDIANCE 25 MG TABS tablet Take 1 tablet by mouth every day 30 tablet 11   Lancet Devices (EASY MINI EJECT LANCING DEVICE) MISC daily.     metFORMIN (GLUCOPHAGE) 500 MG tablet Take 1 tablet by mouth every day 30 tablet 11   montelukast (SINGULAIR) 10 MG tablet Take 1 tablet by mouth at bedtime 30 tablet 11    nitroGLYCERIN (NITROSTAT) 0.4 MG SL tablet Dissolve 1 tablet under tongue every 5 minutes, up to 3 doses for chest pain 25 tablet 11   OneTouch Delica Lancets 99991111 MISC Use to check blood sugars 2x per day. E11.9 100 each 0   traMADol (ULTRAM) 50 MG tablet Take 1 tablet by mouth twice daily as needed for severe pain 60 tablet 2   Varenicline Tartrate, Starter, 0.5 MG X 11 & 1 MG X 42 TBPK 0.5 mg orally once daily for 3 days, then 0.5 mg twice daily on days 4 through 7, and then 1 mg twice daily thereafter 53 each 0   venetoclax (VENCLEXTA) 100 MG tablet Take 2 tablets (200 mg total) by mouth daily. Tablets should be swallowed whole with a meal and a full glass of water. 60 tablet 2   No current facility-administered medications for this visit.    Physical Examination  Vitals:   02/21/23 0919  BP: (!) 162/84  Pulse: (!) 107  Resp: 20  Temp: 98.6 F (37 C)  TempSrc: Temporal  SpO2: 95%  Weight: 135 lb 6.4 oz (61.4 kg)  Height: '5\' 2"'$  (1.575 m)    Body mass index is 24.76 kg/m.  General:  Alert and oriented, no acute distress HEENT: Normal Neck: No bruit or JVD Pulmonary: Clear to auscultation bilaterally Cardiac: Regular Rate and Rhythm without murmur Abdomen: Soft, non-tender, non-distended, no mass, no scars Skin: No rash Extremity Pulses:  2+ radial, brachial, femoral, dorsalis pedis, posterior tibial pulses bilaterally Musculoskeletal: No deformity or edema  Neurologic: Upper and lower extremity motor 5/5 and symmetric  DATA:  Right Graft #  1: Right Fem-BK Pop arterial bypass  +------------------+--------+---------------+---------+---------+                   PSV cm/sStenosis       Waveform Comments   +------------------+--------+---------------+---------+---------+  Inflow           168     30-49% stenosistriphasicturbulent  +------------------+--------+---------------+---------+---------+  Prox Anastomosis  141                    triphasic            +------------------+--------+---------------+---------+---------+  Proximal Graft    82                     triphasic           +------------------+--------+---------------+---------+---------+  Mid Graft         67                     biphasic            +------------------+--------+---------------+---------+---------+  Distal Graft      43                     biphasic            +------------------+--------+---------------+---------+---------+  Distal Anastomosis125                    biphasic            +------------------+--------+---------------+---------+---------+  Outflow          87                     biphasic            +------------------+--------+---------------+---------+---------+        Left Graft #1: Old Left Fem-AK pop arterial bypass  +--------------------+--------+---------------+----------+-----------------  ----+                     PSV cm/sStenosis       Waveform  Comments                +--------------------+--------+---------------+----------+-----------------  ----+  Inflow             247     50-74% stenosistriphasic                         +--------------------+--------+---------------+----------+-----------------  ----+  Proximal Anastomosis132     occluded       monophasicretrograde due  to                                                           occlusion               +--------------------+--------+---------------+----------+-----------------  ----+  Proximal Graft                                                               +--------------------+--------+---------------+----------+-----------------  ----+  Mid Graft                                                                    +--------------------+--------+---------------+----------+-----------------  ----+  Distal Graft                                                                  +--------------------+--------+---------------+----------+-----------------  ----+  Distal Anastomosis  0       occluded                                         +--------------------+--------+---------------+----------+-----------------  ----+  Outflow            56                     monophasicDistal SFA                                                                   reconstituted                                                                collateral flow         +--------------------+--------+---------------+----------+-----------------  ----+     Summary:  Right: 30-49% stenosis noted in the common femoral artery. Patent Right  Fem-pop bypass graft.   Left: 50-74% stenosis noted in the common femoral artery. Occluded Left  fem-pop bypass graft. distal SFA is reconstituted collateral flow.      ABI Findings:  +---------+------------------+-----+-----------+--------+  Right   Rt Pressure (mmHg)IndexWaveform   Comment   +---------+------------------+-----+-----------+--------+  Brachial 151                                         +---------+------------------+-----+-----------+--------+  PTA     144               0.91 multiphasic          +---------+------------------+-----+-----------+--------+  DP      145               0.91 multiphasic          +---------+------------------+-----+-----------+--------+  Great Toe107               0.67 Normal               +---------+------------------+-----+-----------+--------+   +---------+------------------+-----+----------+-------+  Left    Lt Pressure (mmHg)IndexWaveform  Comment  +---------+------------------+-----+----------+-------+  Brachial 159                                       +---------+------------------+-----+----------+-------+  PTA     115               0.72 monophasic         +---------+------------------+-----+----------+-------+  DP       103               0.65 monophasic         +---------+------------------+-----+----------+-------+  Great Toe94                0.59 Abnormal           +---------+------------------+-----+----------+-------+   +-------+-----------+-----------+------------+------------+  ABI/TBIToday's ABIToday's TBIPrevious ABIPrevious TBI  +-------+-----------+-----------+------------+------------+  Right 0.91       0.67       0.98        0.60          +-------+-----------+-----------+------------+------------+  Left  0.72       0.59       0.65        0.38          +-------+-----------+-----------+------------+------------+     Arterial wall calcification precludes accurate ankle pressures and ABIs.  PPG tracings display appropriate pulsatility.  Right ABIs appear decreased. Bilateral TBIs appear increased. Left ABI is  increased.    Summary:  Right: Resting right ankle-brachial index indicates mild right lower  extremity arterial disease. The right toe-brachial index is abnormal.   Left: Resting left ankle-brachial index indicates moderate left lower  extremity arterial disease. The left toe-brachial index is abnormal.     ASSESSMENT/PLAN:   PAD with history of endovascular intervention left LE in [place], as well as  right femoral endarterectomy with vein patch angioplasty and right femoral to below the knee popliteal artery bypass with Gore-Tex by Dr. Trula Slade on 10/04/2020.   Her symptoms have remained unchanged.  She has right LE edema since her bypass surgery.  She wears compression and uses elevation for the edema.  She is asymptomatic for rest pain and non healing wounds.  The duplex shows a patent right LE bypass with   Right: 30-49% stenosis noted in the common femoral artery. Patent Right  Fem-pop bypass graft.   Left: 50-74% stenosis noted in the common femoral artery. Occluded Left  fem-pop bypass graft. distal SFA is reconstituted collateral  flow.   The ABI's are stable with maintained right decreased slightly from  0.98 to 0.91 TBI improved.  The left ABI's showed improvement in both ABI and TBI.  I will schedule her to return in 6 months for repeat surveillance.  If she has problems or concerns she will call our office.     Roxy Horseman PA-C [facility] Office: [phone]  MD in office [place]

## 2023-02-23 ENCOUNTER — Telehealth: Payer: Self-pay

## 2023-02-23 NOTE — Telephone Encounter (Signed)
-----   Message from Sueanne Margarita, MD sent at 01/16/2023  4:24 PM EST ----- Please let patient know that labs were normal.  Continue current medical therapy.

## 2023-02-23 NOTE — Telephone Encounter (Signed)
Results reviewed with patient who verbalizes understanding of normal levels, agrees to continue current medications.

## 2023-02-24 ENCOUNTER — Other Ambulatory Visit: Payer: Self-pay

## 2023-02-24 ENCOUNTER — Telehealth: Payer: Self-pay

## 2023-02-24 DIAGNOSIS — I739 Peripheral vascular disease, unspecified: Secondary | ICD-10-CM

## 2023-02-24 DIAGNOSIS — I70211 Atherosclerosis of native arteries of extremities with intermittent claudication, right leg: Secondary | ICD-10-CM

## 2023-02-24 NOTE — Telephone Encounter (Signed)
Patient calls nurse line requesting a refill on "smoking medication."   She reports she finished the starter pack and does not know what is next.   Will forward to PCP.

## 2023-02-25 MED ORDER — VARENICLINE TARTRATE 1 MG PO TABS: 1.0000 mg | ORAL_TABLET | Freq: Two times a day (BID) | ORAL | 0 refills | Status: DC

## 2023-02-25 NOTE — Telephone Encounter (Signed)
Refill sent for Chantix '1mg'$  BID

## 2023-02-25 NOTE — Addendum Note (Signed)
Addended by: Alcus Dad on: 02/25/2023 12:45 PM   Modules accepted: Orders

## 2023-03-03 ENCOUNTER — Other Ambulatory Visit: Payer: Self-pay

## 2023-03-03 NOTE — Progress Notes (Signed)
Patient outreached by Lazarus Gowda, PharmD Candidate on 03/02/2023 to discuss hypertension   Patient has an automated home blood pressure machine. They did not report home readings.   Medication review was not performed, they currently use mail order pharmacy and were not interested in discussing their medications today. No questions or concerns regarding their medications.   The following barriers to adherence were noted:  - They do not have cost concerns.  - They do not have transportation concerns.  - They do not need assistance obtaining refills.  - They do not feel like one/some of their medications make them feel poorly.  - They do not have questions or concerns about their medications.   Lazarus Gowda, PharmD Candidate     Joseph Art, Pharm.D. PGY-2 Ambulatory Care Pharmacy Resident

## 2023-04-04 ENCOUNTER — Ambulatory Visit: Payer: Self-pay | Admitting: Cardiology

## 2023-04-13 ENCOUNTER — Encounter: Payer: Self-pay | Admitting: Physical Medicine and Rehabilitation

## 2023-04-13 ENCOUNTER — Encounter: Payer: 59 | Attending: Physical Medicine and Rehabilitation | Admitting: Physical Medicine and Rehabilitation

## 2023-04-13 VITALS — BP 137/77 | HR 67 | Ht 62.0 in | Wt 136.8 lb

## 2023-04-13 DIAGNOSIS — M4807 Spinal stenosis, lumbosacral region: Secondary | ICD-10-CM | POA: Diagnosis present

## 2023-04-13 DIAGNOSIS — M62838 Other muscle spasm: Secondary | ICD-10-CM | POA: Diagnosis present

## 2023-04-13 DIAGNOSIS — M545 Low back pain, unspecified: Secondary | ICD-10-CM | POA: Diagnosis present

## 2023-04-13 DIAGNOSIS — G8929 Other chronic pain: Secondary | ICD-10-CM | POA: Diagnosis present

## 2023-04-13 MED ORDER — METHOCARBAMOL 500 MG PO TABS: 500.0000 mg | ORAL_TABLET | Freq: Three times a day (TID) | ORAL | 5 refills | Status: DC | PRN

## 2023-04-13 NOTE — Patient Instructions (Addendum)
Patient is a 77 yr old female with hx of dCHF, DM 2- A1c 7.3 , COPD?, CLL with prior  WBC >100k- now 7.5, and chronic back and R>>>L foot pain. Has chronic low back and RLE>LLE DM neuropathy. Also has CKD3- Cr 1.39-  Still a smoker- 0.5 ppd.  Here for f/u on Nerve pain  Call Dr Anner Crete- Pcp about abdominal pain.   2. Don't wear back support more than 4 hours/day or will weaken muscles in back and make it hurt worse.    3. Suggest tennis balls- hold pressure not massage- 4-5 minutes on each spot- will help relax the muscles in your back- grocery store has tennis balls usually or [employer]   4. Getting Tramadol from PCP- cannot go higher on pain meds since they already make her real sleepy.   5. Went over foraminal stenosis so nerves as they come out of the spine are compressed more on L than R.   6. Needs to stop smoking- usually 25% reduction in back pain when patients stop smoking. Within 1-2 months can improve.   7. Still needs to make appointment with Dr Danielle Dess- since hit head/neck and pain radiates down.   8. Is also having muscles spasms real bad.- Flexeril made her sleepy- will try Robaxin/Methocarbamol- 500 mg 3x/day as needed- for muscle spasms. Don't take with Flexeril/Cyclobenzaprine- can take with Tramadol.   9. F/U in 3 months with Riley Lam and me in 6 months.   10. Don't have another medicine to help- on appropriate nerve pain meds- cannot increase due to CKD, can maybe changing muscle relaxants in future if need be.

## 2023-04-13 NOTE — Progress Notes (Signed)
Subjective:    Patient ID: [name] [name], female    DOB: 1946-11-14, 77 y.o.   MRN: [id number]  HPI Patient is a 77 yr old female with hx of dCHF, DM 2- A1c 7.3 , COPD?, CLL with prior  WBC >100k- now 7.5, and chronic back and R>>>L foot pain. Has chronic low back and RLE>LLE DM neuropathy. Also has CKD3- Cr 1.39-  Still a smoker- 0.5 ppd.  Here for f/u on Nerve pain   Because of kidney function- has been doing gabapentin 600 mg QHS- which is appropriate with kidney function.   Still don't have teeth. Waiting for  dentures- they will call her when they come in.   Taking Duloxetine 60 mg daily.   Has noticed pain a little better-  Duloxetine increased improved pain ~ 10%.  Still Tramadol from PCP- takes BID- didn't take this AM- it makes her sleepy during day- -  but at night, it helps her to go to sleep "and forget about the pain".   In midline in low back- has foraminal stenosis L4/5 and L5/S1  Wears coppertone back support- and helpful, but worse when takes it off- doesn't wear regularly- wore from morning til 7:30pm.   Leans against wall to help ease pain down.  Uses fist against back to help relax muscles.   Has tried lidoderm patches over the counter- not real helpful- not as helpful as wanted it to be.   Stomach is hurting so bad- been going regularly- sometimes 2-3x/day- pretty regular.  Started having abd pain in last 1-2 days. Not worse with eating- but has eaten less due to the pain.  Sharp pain- in/below small pannus in midline- not cramping- not cyclical- and not intermittent. Is constant.   No fever-   Social Hx: Wants to divorce husband- frustrated being with him.  Still smoking- less than used to- if he gets on nerves- 6-8 cigarettes/day  due to husband-  When he's not there, 3 cigarettes/day.  Still on Chantix- but just restarted  Pain Inventory Average Pain 9 Pain Right Now 8 My pain is sharp  In the last 24 hours, has pain interfered with the  following? General activity 2 Relation with others 0 Enjoyment of life 0 What TIME of day is your pain at its worst? daytime Sleep (in general) Good  Pain is worse with: sitting Pain improves with: rest Relief from Meds: 2  Family History  Problem Relation Age of Onset   Heart disease Mother    Asthma Mother    Cancer Mother        uterine    Depression Mother    Heart attack Mother 1   Hyperlipidemia Mother    Hypertension Mother    Stroke Mother    Kidney disease Mother    Heart attack Sister    Stroke Sister    Depression Sister    Diabetes Sister    Hyperlipidemia Sister    Depression Sister    Diabetes Sister    Hyperlipidemia Sister    HIV/AIDS Sister    Diabetes Brother    Asthma Brother    Hypertension Brother    Cancer Maternal Aunt        lung   Heart disease Maternal Grandmother    Heart attack Maternal Grandmother    Cancer Brother        colon   HIV/AIDS Brother    Colon cancer Neg Hx    Social History   Socioeconomic History  Marital status: Single    Spouse name: Not on file   Number of children: 3   Years of education: Not on file   Highest education level: Not on file  Occupational History   Occupation: DISABLED    Employer: DISABLED  Tobacco Use   Smoking status: Every Day    Packs/day: 0.25    Years: 30.00    Additional pack years: 0.00    Total pack years: 7.50    Types: Cigarettes    Start date: 01/01/1976    Passive exposure: Current   Smokeless tobacco: Never   Tobacco comments:    ~ 3 cigarettes / day  Vaping Use   Vaping Use: Never used  Substance and Sexual Activity   Alcohol use: Not Currently   Drug use: Not Currently   Sexual activity: Yes  Other Topics Concern   Not on file  Social History Narrative   ** Merged History Encounter **       Health Care POA:  Emergency Contact: Bruce Donath [phone] (c) End of Life Plan:  Who lives with you: Lives with husband Any pets: none Diet: Patient lacks financial  resources for much food. Pt reports eating what is available. Exercise: Patient    does not have an exercise plan. Seatbelts: Patient reports wearing seatbelt when in vehicle.  [name] Exposure/Protection: Hobbies: Bowling, computer games, Bingo  Has financial difficulties and transportation issues as she and her husband share transpo   rtation      Social Determinants of Health   Financial Resource Strain: High Risk (01/13/2022)   Overall Financial Resource Strain (CARDIA)    Difficulty of Paying Living Expenses: Hard  Food Insecurity: Not on file  Transportation Needs: No Transportation Needs (09/01/2022)   PRAPARE - Administrator, Civil Service (Medical): No    Lack of Transportation (Non-Medical): No  Physical Activity: Not on file  Stress: Not on file  Social Connections: Not on file   Past Surgical History:  Procedure Laterality Date   ABDOMINAL AORTOGRAM W/LOWER EXTREMITY Bilateral 08/12/2020   Procedure: ABDOMINAL AORTOGRAM W/BILATERAL LOWER EXTREMITY RUNOFF;  Surgeon: Nada Libman, MD;  Location: [facility];  Service: Cardiovascular;  Laterality: Bilateral;   ABI  02/2012   ABI <0.65 BL 02/2012   BRAIN MENINGIOMA EXCISION     BRAIN TUMOR EXCISION     Bypass grafting of RLE for PAD claudication      CARDIAC CATHETERIZATION  04/09/208   CESAREAN SECTION     1974, 78 ,80   CHOLECYSTECTOMY, LAPAROSCOPIC     COLONOSCOPY  2014   [place], [place]   ENDARTERECTOMY FEMORAL Right 10/03/2020   Procedure: RIGHT FEMORAL ENDARTERECTOMY WITH VEIN PATCH ANGIOPLASTY;  Surgeon: Nada Libman, MD;  Location: [facility];  Service: Vascular;  Laterality: Right;   ESOPHAGEAL DILATION     FEMORAL-POPLITEAL BYPASS GRAFT Right 10/03/2020   Procedure: RIGHT FEMORAL- BELOW KNEE POPLITEAL BYPASS USING GORE PROPATEN GRAFT;  Surgeon: Nada Libman, MD;  Location: [facility];  Service: Vascular;  Laterality: Right;   PATCH ANGIOPLASTY  10/03/2020   Procedure: VEIN PATCH ANGIOPLASTY;   Surgeon: Nada Libman, MD;  Location: [facility];  Service: Vascular;;   Past Surgical History:  Procedure Laterality Date   ABDOMINAL AORTOGRAM W/LOWER EXTREMITY Bilateral 08/12/2020   Procedure: ABDOMINAL AORTOGRAM W/BILATERAL LOWER EXTREMITY RUNOFF;  Surgeon: Nada Libman, MD;  Location: [facility];  Service: Cardiovascular;  Laterality: Bilateral;   ABI  02/2012  ABI <0.65 BL 02/2012   BRAIN MENINGIOMA EXCISION     BRAIN TUMOR EXCISION     Bypass grafting of RLE for PAD claudication      CARDIAC CATHETERIZATION  04/09/208   CESAREAN SECTION     1974, 78 ,80   CHOLECYSTECTOMY, LAPAROSCOPIC     COLONOSCOPY  2014   [place], [place]   ENDARTERECTOMY FEMORAL Right 10/03/2020   Procedure: RIGHT FEMORAL ENDARTERECTOMY WITH VEIN PATCH ANGIOPLASTY;  Surgeon: Nada Libman, MD;  Location: [facility];  Service: Vascular;  Laterality: Right;   ESOPHAGEAL DILATION     FEMORAL-POPLITEAL BYPASS GRAFT Right 10/03/2020   Procedure: RIGHT FEMORAL- BELOW KNEE POPLITEAL BYPASS USING GORE PROPATEN GRAFT;  Surgeon: Nada Libman, MD;  Location: [facility];  Service: Vascular;  Laterality: Right;   PATCH ANGIOPLASTY  10/03/2020   Procedure: VEIN PATCH ANGIOPLASTY;  Surgeon: Nada Libman, MD;  Location: [facility];  Service: Vascular;;   Past Medical History:  Diagnosis Date   Allergy    environmental   Asthma    Brain tumor    Chronic diastolic CHF (congestive heart failure)    CKD (chronic kidney disease)    CLL (chronic lymphocytic leukemia)    Constipation 11/15/2011   COPD (chronic obstructive pulmonary disease)    CVA (cerebral vascular accident)    Diabetes    GERD (gastroesophageal reflux disease)    Headache    migraines   Hyperlipidemia    Hypertension    PVD (peripheral vascular disease)    Stroke 1998   in 1998 due to tumor-right side   BP 137/77   Pulse 67   Ht  (1.575 m)   Wt 136 lb 12.8 oz (62.1 kg)   SpO2 99%   BMI 25.02 kg/m   Opioid Risk Score:   Fall  Risk Score:  `1  Depression screen [facility] 2/9     04/13/2023    9:22 AM 01/14/2023   10:02 AM 12/24/2022    2:49 PM 09/23/2022    1:35 PM 09/02/2022    3:16 PM 08/02/2022    1:57 PM 05/31/2022    1:35 PM  Depression screen PHQ 2/9  Decreased Interest 0 0 0 0 0 0 2  Down, Depressed, Hopeless 0 0 0 0 0 0   PHQ - 2 Score 0 0 0 0 0 0 2  Altered sleeping   0 0  0 1  Tired, decreased energy   0 Change in appetite   0 0 0 0 0  Feeling bad or failure about yourself    0 0 0 0 0  Trouble concentrating   0 0 0 0 0  Moving slowly or fidgety/restless   0 0 0 0 0  Suicidal thoughts   0 0 0 0 0  PHQ-9 Score   0 Review of Systems  Constitutional: Negative.   HENT: Negative.    Eyes: Negative.   Respiratory: Negative.    Cardiovascular: Negative.   Gastrointestinal: Negative.   Endocrine: Negative.   Genitourinary: Negative.   Musculoskeletal:        Right hip  Skin: Negative.   Allergic/Immunologic: Negative.   Neurological: Negative.   Hematological:  Bruises/bleeds easily.       Plavix  Psychiatric/Behavioral: Negative.    All other systems reviewed and are negative.      Objective:   Physical Exam   Awake, alert, appropriate, but  cannot stop playing on game on pad to have interactive conversation, NAD Sitting comfortably Using Single point cane to walk TTP L>R lower lumbar midline and paraspinals.  Mild odor of smoke- mild     Assessment & Plan:   Patient is a 77 yr old female with hx of dCHF, DM 2- A1c 7.3 , COPD?, CLL with prior  WBC >100k- now 7.5, and chronic back and R>>>L foot pain. Has chronic low back and RLE>LLE DM neuropathy. Also has CKD3- Cr 1.39-  Still a smoker- 0.5 ppd.  Here for f/u on Nerve pain  Call Dr Anner Crete- Pcp about abdominal pain.   2. Don't wear back support more than 4 hours/day or will weaken muscles in back and make it hurt worse.    3. Suggest tennis balls- hold pressure not massage- 4-5 minutes on each spot- will help relax  the muscles in your back- grocery store has tennis balls usually or [employer]   4. Getting Tramadol from PCP- cannot go higher on pain meds since they already make her real sleepy.   5. Went over foraminal stenosis so nerves as they come out of the spine are compressed more on L than R.   6. Needs to stop smoking- usually 25% reduction in back pain when patients stop smoking.   7. Still needs to make appointment with Dr Danielle Dess- since hit head/neck and pain radiates down.   8. Is also having muscles spasms real bad.- Flexeril made her sleepy- will try Robaxin/Methocarbamol- 500 mg 3x/day as needed- for muscle spasms.   9. F/U in 3 months with Riley Lam and me in 6 months.   10. Don't have another medicine to help- on appropriate nerve pain meds- cannot increase due to CKD, can maybe changing muscle relaxants in future if need be.     I spent a total of 32   minutes on total care today- >50% coordination of care- due to discussion over smoking cessation; will help back pain Muscle spasms; d/w pt about her current home issues- grieving about daughter and husband.

## 2023-04-14 ENCOUNTER — Other Ambulatory Visit: Payer: Self-pay | Admitting: Family Medicine

## 2023-04-15 ENCOUNTER — Ambulatory Visit: Payer: 59 | Admitting: Physical Medicine and Rehabilitation

## 2023-04-19 ENCOUNTER — Other Ambulatory Visit: Payer: Self-pay | Admitting: Family Medicine

## 2023-04-19 DIAGNOSIS — M545 Low back pain, unspecified: Secondary | ICD-10-CM

## 2023-04-26 ENCOUNTER — Ambulatory Visit (INDEPENDENT_AMBULATORY_CARE_PROVIDER_SITE_OTHER): Payer: 59 | Admitting: Family Medicine

## 2023-04-26 ENCOUNTER — Encounter: Payer: Self-pay | Admitting: Family Medicine

## 2023-04-26 ENCOUNTER — Other Ambulatory Visit: Payer: Self-pay

## 2023-04-26 VITALS — BP 137/67 | HR 64 | Ht 62.0 in | Wt 138.6 lb

## 2023-04-26 DIAGNOSIS — E1151 Type 2 diabetes mellitus with diabetic peripheral angiopathy without gangrene: Secondary | ICD-10-CM | POA: Diagnosis not present

## 2023-04-26 LAB — POCT GLYCOSYLATED HEMOGLOBIN (HGB A1C): HbA1c, POC (controlled diabetic range): 8.5 % — AB (ref 0.0–7.0)

## 2023-04-26 MED ORDER — ONETOUCH VERIO VI STRP: ORAL_STRIP | 12 refills | Status: DC

## 2023-04-26 MED ORDER — TRULICITY 0.75 MG/0.5ML ~~LOC~~ SOAJ: 0.7500 mg | SUBCUTANEOUS | 0 refills | Status: DC

## 2023-04-26 NOTE — Patient Instructions (Addendum)
It was great to see you!  Things we discussed at today's visit: -Your A1c was 8.5% today, which means your diabetes is not as well-controlled as we'd like. Your goal A1c is less than 8%. Continue your current medications but we will also add trulicity (once weekly injection). I sent a prescription to your pharmacy.  -Work on reducing your soda intake (either switch to diet or cut way back).  -You can check your sugar 1-2 hours after meals to see how different foods/drinks impact your blood sugar and your diabetes.  -You are overdue for a diabetic eye exam. Please contact your eye doctor to schedule an appointment. Ask them to fax the notes to our office.  -Follow up in 3 months for your next diabetes visit.  -Please see your kidney doctor to discuss your blood pressure/kidney medications. BRING your medications to ALL appointments.  Thank you for allowing me to participate in your care. I will miss seeing you! Take care,  Dr. [facility]

## 2023-04-26 NOTE — Progress Notes (Signed)
    SUBJECTIVE:   CHIEF COMPLAINT / HPI:   Type 2 Diabetes Patient is a 77 y.o. female who presents today for diabetes follow-up.  Home medications include: Metformin 500 mg daily and Jardiance 25 mg daily Patient reports excellent medication compliance.  Medications are prepackaged from pharmacy. She does have poor dietary compliance-- drinks regular soda (1/2 can daily) Patient checks fasting sugar daily at home. 192 fasting this morning. Typically mid 100s. Did have one reading in low 200s No hypoglycemic episodes.  Most recent A1Cs:  Lab Results  Component Value Date   HGBA1C 8.5 (A) 04/26/2023   HGBA1C 7.3 (A) 09/23/2022   HGBA1C 8.6 (A) 05/31/2022   Last Microalbumin, LDL, Creatinine: Lab Results  Component Value Date   LDLCALC 43 01/14/2023   CREATININE 1.45 (H) 01/28/2023    Patient is not up to date on diabetic eye. Patient is up to date on diabetic foot exam.   PERTINENT  PMH / PSH: CKD stage III, HTN, CLL, COPD, tobacco use, HFpEF, PAD  OBJECTIVE:   BP 137/67   Pulse 64   Ht 5\' 2"  (1.575 m)   Wt 138 lb 9.6 oz (62.9 kg)   SpO2 98%   BMI 25.35 kg/m   Gen: NAD, pleasant, able to participate in exam CV: RRR, normal S1/S2, no murmur Resp: Normal effort, lungs CTAB Extremities: no edema or cyanosis Skin: warm and dry, no rashes noted Neuro: alert, no obvious focal deficits Psych: Normal affect and mood  ASSESSMENT/PLAN:   DM (diabetes mellitus), type 2 with peripheral vascular complications (HCC) A1c 8.5% today, which is above goal.  -Continue metformin 500 mg daily -Continue Jardiance 25 mg daily -Start Trulicity 0.75 mg weekly -Counseled on dietary changes and reducing regular soda intake -Continue home CBG monitoring, advised to also check postprandial readings -UTD on foot exam, urine microalbumin, BMP -Reminded about eye exam -On statin -Not on ACE/ARB.  Previously d/c'd by nephrologist. Advised nephro follow-up. -Next A1c 3 months      Maury Dus, MD [facility]

## 2023-04-26 NOTE — Assessment & Plan Note (Signed)
A1c 8.5% today, which is above goal.  -Continue metformin 500 mg daily -Continue Jardiance 25 mg daily -Start Trulicity 0.75 mg weekly -Counseled on dietary changes and reducing regular soda intake -Continue home CBG monitoring, advised to also check postprandial readings -UTD on foot exam, urine microalbumin, BMP -Reminded about eye exam -On statin -Not on ACE/ARB.  Previously d/c'd by nephrologist. Advised nephro follow-up. -Next A1c 3 months

## 2023-04-26 NOTE — Telephone Encounter (Signed)
Patient calls nurse line regarding needing refill on glucose testing strips. Please include how often patient should be checking blood sugar in prescription directions.   Thanks.   Veronda Prude, RN

## 2023-05-12 ENCOUNTER — Telehealth: Payer: Self-pay | Admitting: Family Medicine

## 2023-05-12 NOTE — Telephone Encounter (Signed)
Contacted [name] [name] to schedule their annual wellness visit. Appointment made for 05/16/2023.  Thank you,  [facility] Support [facility] Direct dial  [phone]

## 2023-05-14 ENCOUNTER — Other Ambulatory Visit: Payer: Self-pay | Admitting: Family Medicine

## 2023-05-14 ENCOUNTER — Other Ambulatory Visit (HOSPITAL_BASED_OUTPATIENT_CLINIC_OR_DEPARTMENT_OTHER): Payer: Self-pay | Admitting: Cardiology

## 2023-05-15 ENCOUNTER — Other Ambulatory Visit: Payer: Self-pay | Admitting: Physical Medicine and Rehabilitation

## 2023-05-15 NOTE — Progress Notes (Addendum)
Subjective:   [name] [name] is a 77 y.o. female who presents for Medicare Annual (Subsequent) preventive examination.  Review of Systems    ***       Objective:    There were no vitals filed for this visit. There is no height or weight on file to calculate BMI.     12/24/2022    2:49 PM 09/23/2022    1:34 PM 09/02/2022    3:16 PM 08/30/2022    8:28 PM 08/02/2022    1:57 PM 04/28/2022    1:33 PM 04/18/2022    1:37 AM  Advanced Directives  Does Patient Have a Medical Advance Directive? No No No No No No No  Would patient like information on creating a medical advance directive? No - Patient declined No - Patient declined  No - Patient declined  No - Patient declined No - Patient declined    Current Medications (verified) Outpatient Encounter Medications as of 05/16/2023  Medication Sig   acetaminophen (TYLENOL) 500 MG tablet Take 500-1,000 mg by mouth every 6 (six) hours as needed for moderate pain.   albuterol (PROVENTIL) (2.5 MG/3ML) 0.083% nebulizer solution Inhale 1 vial via nebulizer every 6 hours for wheezing or shortness of breath as needed   albuterol (VENTOLIN HFA) 108 (90 Base) MCG/ACT inhaler Inhale 2 puffs by mouth every 6 hours as needed for wheezing or shortness of breath   Alcohol Swabs (ALCOHOL PADS) 70 % PADS    amLODipine (NORVASC) 10 MG tablet Take 1 tablet by mouth every day   atorvastatin (LIPITOR) 80 MG tablet Take 1 tablet by mouth daily. Need appt   Blood Glucose Calibration (OT ULTRA/FASTTK CNTRL SOLN) SOLN See admin instructions.   blood glucose meter kit and supplies KIT Dispense based on patient and insurance preference. Use up to four times daily as directed.   Blood Glucose Monitoring Suppl (ACCU-CHEK GUIDE ME) w/Device KIT Use to test blood sugar twice per day. E11.9   BREO ELLIPTA 100-25 MCG/ACT AEPB Inhale 1 puff by mouth every day   clopidogrel (PLAVIX) 75 MG tablet Take 1 tablet by mouth every day   Dulaglutide (TRULICITY) 0.75 MG/0.5ML SOPN Inject  0.75 mg into the skin once a week.   DULoxetine (CYMBALTA) 60 MG capsule Take 1 capsule (60 mg total) by mouth daily.   famotidine (PEPCID) 20 MG tablet Take 1 tablet by mouth twice daily   furosemide (LASIX) 20 MG tablet Take 1 tablet by mouth daily. Need appt   gabapentin (NEURONTIN) 300 MG capsule Take 2 capsules by mouth twice daily. Need MD appt   glucose blood (ONETOUCH VERIO) test strip Use to check blood sugar three times daily.   iron polysaccharides (NIFEREX) 150 MG capsule Take 1 capsule (150 mg total) by mouth daily.   JARDIANCE 25 MG TABS tablet Take 1 tablet by mouth every day   Lancet Devices (EASY MINI EJECT LANCING DEVICE) MISC daily.   Lancets (ONETOUCH DELICA PLUS LANCET33G) MISC USE   TO CHECK GLUCOSE ONCE DAILY   metFORMIN (GLUCOPHAGE) 500 MG tablet Take 1 tablet by mouth every day   methocarbamol (ROBAXIN) 500 MG tablet Take 1 tablet (500 mg total) by mouth every 8 (eight) hours as needed for muscle spasms.   montelukast (SINGULAIR) 10 MG tablet Take 1 tablet by mouth at bedtime   nitroGLYCERIN (NITROSTAT) 0.4 MG SL tablet Dissolve 1 tablet under tongue every 5 minutes, up to 3 doses for chest pain   traMADol (ULTRAM) 50 MG tablet  Take 1 tablet by mouth twice daily as needed for severe pain   varenicline (CHANTIX CONTINUING MONTH PAK) 1 MG tablet Take 1 tablet (1 mg total) by mouth 2 (two) times daily.   venetoclax (VENCLEXTA) 100 MG tablet Take 2 tablets (200 mg total) by mouth daily. Tablets should be swallowed whole with a meal and a full glass of water.   No facility-administered encounter medications on file as of 05/16/2023.    Allergies (verified) Gazyva [obinutuzumab], Aspirin, No healthtouch food allergies, Aspirin, and Baclofen   History: Past Medical History:  Diagnosis Date   Allergy    environmental   Asthma    Brain tumor (HCC)    Chronic diastolic CHF (congestive heart failure) (HCC)    CKD (chronic kidney disease)    CLL (chronic lymphocytic  leukemia) (HCC)    Constipation 11/15/2011   COPD (chronic obstructive pulmonary disease) (HCC)    CVA (cerebral vascular accident) (HCC)    Diabetes (HCC)    GERD (gastroesophageal reflux disease)    Headache    migraines   Hyperlipidemia    Hypertension    PVD (peripheral vascular disease) (HCC)    Stroke (HCC) 1998   in 1998 due to tumor-right side   Past Surgical History:  Procedure Laterality Date   ABDOMINAL AORTOGRAM W/LOWER EXTREMITY Bilateral 08/12/2020   Procedure: ABDOMINAL AORTOGRAM W/BILATERAL LOWER EXTREMITY RUNOFF;  Surgeon: Nada Libman, MD;  Location: [facility];  Service: Cardiovascular;  Laterality: Bilateral;   ABI  02/2012   ABI <0.65 BL 02/2012   BRAIN MENINGIOMA EXCISION     BRAIN TUMOR EXCISION     Bypass grafting of RLE for PAD claudication      CARDIAC CATHETERIZATION  04/09/208   CESAREAN SECTION     1974, 78 ,80   CHOLECYSTECTOMY, LAPAROSCOPIC     COLONOSCOPY  2014   [place], [place]   ENDARTERECTOMY FEMORAL Right 10/03/2020   Procedure: RIGHT FEMORAL ENDARTERECTOMY WITH VEIN PATCH ANGIOPLASTY;  Surgeon: Nada Libman, MD;  Location: [facility] OR;  Service: Vascular;  Laterality: Right;   ESOPHAGEAL DILATION     FEMORAL-POPLITEAL BYPASS GRAFT Right 10/03/2020   Procedure: RIGHT FEMORAL- BELOW KNEE POPLITEAL BYPASS USING GORE PROPATEN GRAFT;  Surgeon: Nada Libman, MD;  Location: [facility] OR;  Service: Vascular;  Laterality: Right;   PATCH ANGIOPLASTY  10/03/2020   Procedure: VEIN PATCH ANGIOPLASTY;  Surgeon: Nada Libman, MD;  Location: [facility] OR;  Service: Vascular;;   Family History  Problem Relation Age of Onset   Heart disease Mother    Asthma Mother    Cancer Mother        uterine    Depression Mother    Heart attack Mother 43   Hyperlipidemia Mother    Hypertension Mother    Stroke Mother    Kidney disease Mother    Heart attack Sister    Stroke Sister    Depression Sister    Diabetes Sister    Hyperlipidemia Sister     Depression Sister    Diabetes Sister    Hyperlipidemia Sister    HIV/AIDS Sister    Diabetes Brother    Asthma Brother    Hypertension Brother    Cancer Maternal Aunt        lung   Heart disease Maternal Grandmother    Heart attack Maternal Grandmother    Cancer Brother        colon   HIV/AIDS Brother    Colon cancer  Neg Hx    Social History   Socioeconomic History   Marital status: Single    Spouse name: Not on file   Number of children: 3   Years of education: Not on file   Highest education level: Not on file  Occupational History   Occupation: DISABLED    Employer: DISABLED  Tobacco Use   Smoking status: Every Day    Packs/day: 0.25    Years: 30.00    Additional pack years: 0.00    Total pack years: 7.50    Types: Cigarettes    Start date: 01/01/1976    Passive exposure: Current   Smokeless tobacco: Never   Tobacco comments:    ~ 3 cigarettes / day  Vaping Use   Vaping Use: Never used  Substance and Sexual Activity   Alcohol use: Not Currently   Drug use: Not Currently   Sexual activity: Yes  Other Topics Concern   Not on file  Social History Narrative   ** Merged History Encounter **       Health Care POA:  Emergency Contact: Bruce Donath [phone] (c) End of Life Plan:  Who lives with you: Lives with husband Any pets: none Diet: Patient lacks financial resources for much food. Pt reports eating what is available. Exercise: Patient    does not have an exercise plan. Seatbelts: Patient reports wearing seatbelt when in vehicle.  [name] Exposure/Protection: Hobbies: Bowling, computer games, Bingo  Has financial difficulties and transportation issues as she and her husband share transpo   rtation      Social Determinants of Health   Financial Resource Strain: High Risk (01/13/2022)   Overall Financial Resource Strain (CARDIA)    Difficulty of Paying Living Expenses: Hard  Food Insecurity: Not on file  Transportation Needs: No Transportation Needs  (09/01/2022)   PRAPARE - Administrator, Civil Service (Medical): No    Lack of Transportation (Non-Medical): No  Physical Activity: Not on file  Stress: Not on file  Social Connections: Not on file    Tobacco Counseling Ready to quit: Not Answered Counseling given: Not Answered Tobacco comments: ~ 3 cigarettes / day   Clinical Intake:                 Diabetic?Yes   Nutrition Risk Assessment:  Has the patient had any N/V/D within the last 2 months?  {YES/NO:21197} Does the patient have any non-healing wounds?  {YES/NO:21197} Has the patient had any unintentional weight loss or weight gain?  {YES/NO:21197}  Diabetes:  Is the patient diabetic?  {YES/NO:21197} If diabetic, was a CBG obtained today?  {YES/NO:21197} Did the patient bring in their glucometer from home?  {YES/NO:21197} How often do you monitor your CBG's? ***.   Financial Strains and Diabetes Management:  Are you having any financial strains with the device, your supplies or your medication? {YES/NO:21197}.  Does the patient want to be seen by Chronic Care Management for management of their diabetes?  {YES/NO:21197} Would the patient like to be referred to a Nutritionist or for Diabetic Management?  {YES/NO:21197}  Diabetic Exams:  {Diabetic Eye Exam:2101801} Diabetic Foot Exam: Completed 05/31/22          Activities of Daily Living     No data to display          Patient Care Team: Maury Dus, MD as PCP - General (Family Medicine) Quintella Reichert, MD as PCP - Cardiology (Cardiology) Joana Reamer, DO (Family Medicine) Juanell Fairly, RN  as Case Manager  Indicate any recent Medical Services you may have received from other than [facility] providers in the past year (date may be approximate).     Assessment:   This is a routine wellness examination for [place].  Hearing/Vision screen No results found.  Dietary issues and exercise activities discussed:     Goals  Addressed   None    Depression Screen    04/13/2023    9:22 AM 01/14/2023   10:02 AM 12/24/2022    2:49 PM 09/23/2022    1:35 PM 09/02/2022    3:16 PM 08/02/2022    1:57 PM 05/31/2022    1:35 PM  PHQ 2/9 Scores  PHQ - 2 Score 0 0 0 0 0 0 2  PHQ- 9 Score   0 2  1 5     Fall Risk    04/13/2023    9:21 AM 01/14/2023   10:02 AM 12/24/2022    2:49 PM 09/23/2022    1:34 PM 08/02/2022    1:57 PM  Fall Risk   Falls in the past year? 1 1 1 1  0  Number falls in past yr: 1 1 0 1 0  Comment no new falls      Injury with Fall? 1 1 1 1  0  Risk for fall due to :  History of fall(s);Impaired balance/gait  Impaired balance/gait;Impaired mobility;Medication side effect   Follow up    Falls prevention discussed Falls evaluation completed    FALL RISK PREVENTION PERTAINING TO THE HOME:  Any stairs in or around the home? {YES/NO:21197} If so, are there any without handrails? {YES/NO:21197} Home free of loose throw rugs in walkways, pet beds, electrical cords, etc? {YES/NO:21197} Adequate lighting in your home to reduce risk of falls? {YES/NO:21197}  ASSISTIVE DEVICES UTILIZED TO PREVENT FALLS:  Life alert? {YES/NO:21197} Use of a cane, walker or w/c? {YES/NO:21197} Grab bars in the bathroom? {YES/NO:21197} Shower chair or bench in shower? {YES/NO:21197} Elevated toilet seat or a handicapped toilet? {YES/NO:21197}  TIMED UP AND GO:  Was the test performed? No . Telephonic visit    Cognitive Function:    04/27/2011   10:00 AM  MMSE - Mini Mental State Exam  Orientation to time 5  Orientation to Place 5  Registration 3  Attention/ Calculation 5  Recall 1  Language- name 2 objects 2  Language- repeat 1  Language- follow 3 step command 3  Language- read & follow direction 1  Write a sentence 1  Copy design 1  Total score 28        Immunizations Immunization History  Administered Date(s) Administered   COVID-19, mRNA, vaccine(Comirnaty)12 years and older 02/11/2023   Fluad  Quad(high Dose 65+) 11/06/2021, 09/23/2022   H1N1 12/05/2008   Influenza Split 02/11/2012, 12/07/2015   Influenza Whole 10/01/2008, 09/25/2009, 09/24/2010   Influenza,inj,Quad PF,6+ Mos 10/20/2017, 09/29/2018, 09/19/2019, 01/06/2021   Influenza-Unspecified 10/17/2016   PFIZER(Purple Top)SARS-COV-2 Vaccination 01/25/2020, 03/05/2020, 01/06/2021   Pfizer Covid-19 Vaccine Bivalent Booster 32yrs & up 04/28/2022   Pneumococcal Conjugate-13 11/29/2018   Pneumococcal Polysaccharide-23 10/27/2001, 10/21/2010, 04/11/2017   Td 09/26/2002    TDAP status: Due, Education has been provided regarding the importance of this vaccine. Advised may receive this vaccine at local pharmacy or Health Dept. Aware to provide a copy of the vaccination record if obtained from local pharmacy or Health Dept. Verbalized acceptance and understanding.  Pneumococcal vaccine status: Up to date  Covid-19 vaccine status: Information provided on how to obtain vaccines.  Qualifies for Shingles Vaccine? Yes   Zostavax completed No   Shingrix Completed?: No.    Education has been provided regarding the importance of this vaccine. Patient has been advised to call insurance company to determine out of pocket expense if they have not yet received this vaccine. Advised may also receive vaccine at local pharmacy or Health Dept. Verbalized acceptance and understanding.  Screening Tests Health Maintenance  Topic Date Due   Zoster Vaccines- Shingrix (1 of 2) Never done   Medicare Annual Wellness (AWV)  04/26/2012   DTaP/Tdap/Td (2 - Tdap) 09/26/2012   OPHTHALMOLOGY EXAM  04/28/2022   COVID-19 Vaccine (6 - 2023-24 season) 04/08/2023   FOOT EXAM  06/01/2023   HEMOGLOBIN A1C  07/26/2023   INFLUENZA VACCINE  07/28/2023   Diabetic kidney evaluation - Urine ACR  09/24/2023   COLONOSCOPY (Pts 45-8yrs Insurance coverage will need to be confirmed)  12/05/2023   Diabetic kidney evaluation - eGFR measurement  01/29/2024   Pneumonia  Vaccine 33+ Years old  Completed   DEXA SCAN  Completed   Hepatitis C Screening  Completed   HPV VACCINES  Aged Out    Health Maintenance  Health Maintenance Due  Topic Date Due   Zoster Vaccines- Shingrix (1 of 2) Never done   Medicare Annual Wellness (AWV)  04/26/2012   DTaP/Tdap/Td (2 - Tdap) 09/26/2012   OPHTHALMOLOGY EXAM  04/28/2022   COVID-19 Vaccine (6 - 2023-24 season) 04/08/2023    Colorectal cancer screening: Type of screening: Colonoscopy. Completed 12/04/18. Repeat every 5 years  Mammogram status: No longer required due to age and preference.  Bone Density status: Completed 12/08/18. Results reflect: Bone density results: NORMAL. Repeat every 5 years.  Lung Cancer Screening: (Low Dose CT Chest recommended if Age 23-80 years, 30 pack-year currently smoking OR have quit w/in 15years.) does not qualify.   Lung Cancer Screening Referral: n/a  Additional Screening:  Hepatitis C Screening: does qualify; Completed 10/29/16  Vision Screening: Recommended annual ophthalmology exams for early detection of glaucoma and other disorders of the eye. Is the patient up to date with their annual eye exam?  {YES/NO:21197} Who is the provider or what is the name of the office in which the patient attends annual eye exams? *** If pt is not established with a provider, would they like to be referred to a provider to establish care? {YES/NO:21197}.   Dental Screening: Recommended annual dental exams for proper oral hygiene  Community Resource Referral / Chronic Care Management: CRR required this visit?  {YES/NO:21197}  CCM required this visit?  {YES/NO:21197}     Plan:     I have personally reviewed and noted the following in the patient's chart:   Medical and social history Use of alcohol, tobacco or illicit drugs  Current medications and supplements including opioid prescriptions. {Opioid [phone]} Functional ability and status Nutritional status Physical  activity Advanced directives List of other physicians Hospitalizations, surgeries, and ER visits in previous 12 months Vitals Screenings to include cognitive, depression, and falls Referrals and appointments  In addition, I have reviewed and discussed with patient certain preventive protocols, quality metrics, and best practice recommendations. A written personalized care plan for preventive services as well as general preventive health recommendations were provided to patient.     Durwin Nora, [place]   1/61/0960   Due to this being a virtual visit, the after visit summary with patients personalized plan was offered to patient via mail or my-chart. ***Patient declined at this time./ Patient  would like to access on my-chart/ per request, patient was mailed a copy of AVS./ Patient preferred to pick up at office at next visit  Nurse Notes: ***

## 2023-05-15 NOTE — Patient Instructions (Signed)
[name] , Thank you for taking time to come for your Medicare Wellness Visit. I appreciate your ongoing commitment to your health goals. Please review the following plan we discussed and let me know if I can assist you in the future.   These are the goals we discussed:  Goals      Exercise 1x a week     Reduce cigarettes to 8 a day        This is a list of the screening recommended for you and due dates:  Health Maintenance  Topic Date Due   Zoster (Shingles) Vaccine (1 of 2) Never done   Medicare Annual Wellness Visit  04/26/2012   DTaP/Tdap/Td vaccine (2 - Tdap) 09/26/2012   Eye exam for diabetics  04/28/2022   COVID-19 Vaccine (6 - 2023-24 season) 04/08/2023   Complete foot exam   06/01/2023   Hemoglobin A1C  07/26/2023   Flu Shot  07/28/2023   Yearly kidney health urinalysis for diabetes  09/24/2023   Colon Cancer Screening  12/05/2023   Yearly kidney function blood test for diabetes  01/29/2024   Pneumonia Vaccine  Completed   DEXA scan (bone density measurement)  Completed   Hepatitis C Screening: USPSTF Recommendation to screen - Ages 31-79 yo.  Completed   HPV Vaccine  Aged Out    Advanced directives: ***  Conditions/risks identified: Aim for 30 minutes of exercise or brisk walking, 6-8 glasses of water, and 5 servings of fruits and vegetables each day.   Next appointment: Follow up in one year for your annual wellness visit    Preventive Care 65 Years and Older, Female Preventive care refers to lifestyle choices and visits with your health care provider that can promote health and wellness. What does preventive care include? A yearly physical exam. This is also called an annual well check. Dental exams once or twice a year. Routine eye exams. Ask your health care provider how often you should have your eyes checked. Personal lifestyle choices, including: Daily care of your teeth and gums. Regular physical activity. Eating a healthy diet. Avoiding tobacco  and drug use. Limiting alcohol use. Practicing safe sex. Taking low-dose aspirin every day. Taking vitamin and mineral supplements as recommended by your health care provider. What happens during an annual well check? The services and screenings done by your health care provider during your annual well check will depend on your age, overall health, lifestyle risk factors, and family history of disease. Counseling  Your health care provider may ask you questions about your: Alcohol use. Tobacco use. Drug use. Emotional well-being. Home and relationship well-being. Sexual activity. Eating habits. History of falls. Memory and ability to understand (cognition). Work and work Astronomer. Reproductive health. Screening  You may have the following tests or measurements: Height, weight, and BMI. Blood pressure. Lipid and cholesterol levels. These may be checked every 5 years, or more frequently if you are over 70 years old. Skin check. Lung cancer screening. You may have this screening every year starting at age 32 if you have a 30-pack-year history of smoking and currently smoke or have quit within the past 15 years. Fecal occult blood test (FOBT) of the stool. You may have this test every year starting at age 101. Flexible sigmoidoscopy or colonoscopy. You may have a sigmoidoscopy every 5 years or a colonoscopy every 10 years starting at age 74. Hepatitis C blood test. Hepatitis B blood test. Sexually transmitted disease (STD) testing. Diabetes screening. This is done by  checking your blood sugar (glucose) after you have not eaten for a while (fasting). You may have this done every 1-3 years. Bone density scan. This is done to screen for osteoporosis. You may have this done starting at age 36. Mammogram. This may be done every 1-2 years. Talk to your health care provider about how often you should have regular mammograms. Talk with your health care provider about your test results,  treatment options, and if necessary, the need for more tests. Vaccines  Your health care provider may recommend certain vaccines, such as: Influenza vaccine. This is recommended every year. Tetanus, diphtheria, and acellular pertussis (Tdap, Td) vaccine. You may need a Td booster every 10 years. Zoster vaccine. You may need this after age 49. Pneumococcal 13-valent conjugate (PCV13) vaccine. One dose is recommended after age 85. Pneumococcal polysaccharide (PPSV23) vaccine. One dose is recommended after age 79. Talk to your health care provider about which screenings and vaccines you need and how often you need them. This information is not intended to replace advice given to you by your health care provider. Make sure you discuss any questions you have with your health care provider. Document Released: 01/09/2016 Document Revised: 09/01/2016 Document Reviewed: 10/14/2015 Elsevier Interactive Patient Education  2017 [employer].  Fall Prevention in the Home Falls can cause injuries. They can happen to people of all ages. There are many things you can do to make your home safe and to help prevent falls. What can I do on the outside of my home? Regularly fix the edges of walkways and driveways and fix any cracks. Remove anything that might make you trip as you walk through a door, such as a raised step or threshold. Trim any bushes or trees on the path to your home. Use bright outdoor lighting. Clear any walking paths of anything that might make someone trip, such as rocks or tools. Regularly check to see if handrails are loose or broken. Make sure that both sides of any steps have handrails. Any raised decks and porches should have guardrails on the edges. Have any leaves, snow, or ice cleared regularly. Use sand or salt on walking paths during winter. Clean up any spills in your garage right away. This includes oil or grease spills. What can I do in the bathroom? Use night  lights. Install grab bars by the toilet and in the tub and shower. Do not use towel bars as grab bars. Use non-skid mats or decals in the tub or shower. If you need to sit down in the shower, use a plastic, non-slip stool. Keep the floor dry. Clean up any water that spills on the floor as soon as it happens. Remove soap buildup in the tub or shower regularly. Attach bath mats securely with double-sided non-slip rug tape. Do not have throw rugs and other things on the floor that can make you trip. What can I do in the bedroom? Use night lights. Make sure that you have a light by your bed that is easy to reach. Do not use any sheets or blankets that are too big for your bed. They should not hang down onto the floor. Have a firm chair that has side arms. You can use this for support while you get dressed. Do not have throw rugs and other things on the floor that can make you trip. What can I do in the kitchen? Clean up any spills right away. Avoid walking on wet floors. Keep items that you use a  lot in easy-to-reach places. If you need to reach something above you, use a strong step stool that has a grab bar. Keep electrical cords out of the way. Do not use floor polish or wax that makes floors slippery. If you must use wax, use non-skid floor wax. Do not have throw rugs and other things on the floor that can make you trip. What can I do with my stairs? Do not leave any items on the stairs. Make sure that there are handrails on both sides of the stairs and use them. Fix handrails that are broken or loose. Make sure that handrails are as long as the stairways. Check any carpeting to make sure that it is firmly attached to the stairs. Fix any carpet that is loose or worn. Avoid having throw rugs at the top or bottom of the stairs. If you do have throw rugs, attach them to the floor with carpet tape. Make sure that you have a light switch at the top of the stairs and the bottom of the stairs. If  you do not have them, ask someone to add them for you. What else can I do to help prevent falls? Wear shoes that: Do not have high heels. Have rubber bottoms. Are comfortable and fit you well. Are closed at the toe. Do not wear sandals. If you use a stepladder: Make sure that it is fully opened. Do not climb a closed stepladder. Make sure that both sides of the stepladder are locked into place. Ask someone to hold it for you, if possible. Clearly mark and make sure that you can see: Any grab bars or handrails. First and last steps. Where the edge of each step is. Use tools that help you move around (mobility aids) if they are needed. These include: Canes. Walkers. Scooters. Crutches. Turn on the lights when you go into a dark area. Replace any light bulbs as soon as they burn out. Set up your furniture so you have a clear path. Avoid moving your furniture around. If any of your floors are uneven, fix them. If there are any pets around you, be aware of where they are. Review your medicines with your doctor. Some medicines can make you feel dizzy. This can increase your chance of falling. Ask your doctor what other things that you can do to help prevent falls. This information is not intended to replace advice given to you by your health care provider. Make sure you discuss any questions you have with your health care provider. Document Released: 10/09/2009 Document Revised: 05/20/2016 Document Reviewed: 01/17/2015 Elsevier Interactive Patient Education  2017 [employer].

## 2023-05-16 ENCOUNTER — Ambulatory Visit (INDEPENDENT_AMBULATORY_CARE_PROVIDER_SITE_OTHER): Payer: 59

## 2023-05-16 VITALS — Ht 62.0 in | Wt 138.0 lb

## 2023-05-16 DIAGNOSIS — Z0000 Encounter for general adult medical examination without abnormal findings: Secondary | ICD-10-CM

## 2023-05-24 ENCOUNTER — Other Ambulatory Visit: Payer: Self-pay | Admitting: Family Medicine

## 2023-05-24 DIAGNOSIS — M545 Low back pain, unspecified: Secondary | ICD-10-CM

## 2023-06-14 ENCOUNTER — Encounter: Payer: Self-pay | Admitting: Family Medicine

## 2023-06-14 ENCOUNTER — Ambulatory Visit (INDEPENDENT_AMBULATORY_CARE_PROVIDER_SITE_OTHER): Payer: 59 | Admitting: Family Medicine

## 2023-06-14 VITALS — BP 139/77 | HR 66 | Ht 62.0 in | Wt 134.0 lb

## 2023-06-14 DIAGNOSIS — I10 Essential (primary) hypertension: Secondary | ICD-10-CM

## 2023-06-14 DIAGNOSIS — F17200 Nicotine dependence, unspecified, uncomplicated: Secondary | ICD-10-CM

## 2023-06-14 DIAGNOSIS — E1151 Type 2 diabetes mellitus with diabetic peripheral angiopathy without gangrene: Secondary | ICD-10-CM | POA: Diagnosis not present

## 2023-06-14 MED ORDER — VARENICLINE TARTRATE 1 MG PO TABS: 1.0000 mg | ORAL_TABLET | Freq: Two times a day (BID) | ORAL | 1 refills | Status: DC

## 2023-06-14 NOTE — Progress Notes (Signed)
    SUBJECTIVE:   CHIEF COMPLAINT / HPI:   Smoking Cessation Patient motivated to quit.  Down to 2 cigarettes per day from maximum of 0.5 PPD.  Has been out of Chantix for past few weeks but previously found it very helpful.  Would like refills.  Diabetes Due for foot exam and she would like this done today.  Wondering if she needs to see podiatrist for footcare.  Trims her own nails. Otherwise no concerns related to her diabetes.  Patient also wanted to say bye to me as her PCP prior to graduation.  PERTINENT  PMH / PSH: CLL, CKD stage III, HTN  OBJECTIVE:   BP 139/77   Pulse 66   Ht 5\' 2"  (1.575 m)   Wt 134 lb (60.8 kg)   SpO2 95%   BMI 24.51 kg/m   Gen: NAD, pleasant, able to participate in exam CV: RRR, normal S1/S2, no murmur Resp: Normal effort, lungs CTAB Extremities: 1+ nonpitting edema right lower extremity which is chronic Skin: warm and dry, no rashes noted Neuro: alert, no obvious focal deficits Psych: Normal affect and mood Diabetic foot exam was performed with the following findings:   No deformities, ulcerations, or other skin breakdown Normal sensation of 10g monofilament Intact posterior tibialis and dorsalis pedis pulses Thickened, brittle toenails bilaterally     ASSESSMENT/PLAN:   DM (diabetes mellitus), type 2 with peripheral vascular complications (HCC) Diabetic foot exam performed today.  No evidence of neuropathy or abnormal skin breakdown.  Follow-up in 6 weeks for diabetes visit with A1c.  TOBACCO DEPENDENCE Congratulated patient on her progress.  Refill sent on Chantix 1 mg BID. Set quit date of July 1st.  Follow-up in 6 weeks     Maury Dus, MD [facility]

## 2023-06-14 NOTE — Patient Instructions (Addendum)
It was great to see you!  Things we discussed at today's visit: - Quitting smoking: I'm VERY proud of you for the progress you have made. My #1 wish before I graduate is for you to quit entirely! I sent refills on your Chantix.  - If you would like to see the podiatrist for diabetic foot care, their contact info is below. You should not need a referral. [facility] [address] [place], [place] [address] [phone]  - Make sure you're seeing your kidney doctor on a regular basis. - You are also overdue for a diabetic eye exam. Please schedule an appointment with your eye doctor and ask them to fax the notes to our office.  Follow up in ~6 weeks for diabetes visit & to meet Dr Threasa Beards.  Take care and seek immediate care sooner if you develop any concerns.  Dr. [facility]

## 2023-06-15 NOTE — Assessment & Plan Note (Signed)
Diabetic foot exam performed today.  No evidence of neuropathy or abnormal skin breakdown.  Follow-up in 6 weeks for diabetes visit with A1c.

## 2023-06-15 NOTE — Assessment & Plan Note (Addendum)
Congratulated patient on her progress.  Refill sent on Chantix 1 mg BID. Set quit date of July 1st.  Follow-up in 6 weeks

## 2023-06-21 ENCOUNTER — Other Ambulatory Visit: Payer: Self-pay | Admitting: Family Medicine

## 2023-06-21 DIAGNOSIS — M545 Low back pain, unspecified: Secondary | ICD-10-CM

## 2023-06-22 ENCOUNTER — Telehealth: Payer: Self-pay | Admitting: Hematology

## 2023-06-24 ENCOUNTER — Other Ambulatory Visit: Payer: Self-pay | Admitting: Family Medicine

## 2023-07-08 ENCOUNTER — Encounter: Payer: Self-pay | Admitting: Registered Nurse

## 2023-07-08 ENCOUNTER — Encounter: Payer: 59 | Attending: Physical Medicine and Rehabilitation | Admitting: Registered Nurse

## 2023-07-08 VITALS — BP 175/85 | HR 62 | Ht 62.0 in | Wt 136.0 lb

## 2023-07-08 DIAGNOSIS — M4807 Spinal stenosis, lumbosacral region: Secondary | ICD-10-CM | POA: Diagnosis present

## 2023-07-08 DIAGNOSIS — G8929 Other chronic pain: Secondary | ICD-10-CM | POA: Diagnosis present

## 2023-07-08 DIAGNOSIS — G894 Chronic pain syndrome: Secondary | ICD-10-CM | POA: Diagnosis present

## 2023-07-08 DIAGNOSIS — M545 Low back pain, unspecified: Secondary | ICD-10-CM | POA: Insufficient documentation

## 2023-07-08 DIAGNOSIS — I10 Essential (primary) hypertension: Secondary | ICD-10-CM | POA: Insufficient documentation

## 2023-07-08 NOTE — Progress Notes (Signed)
Subjective:    Patient ID: [name] [name], female    DOB: 1946/11/04, 77 y.o.   MRN: [id number]  HPI: [name] [name] is a 77 y.o. female who returns for follow up appointment for chronic pain and medication refill. She states her pain is located in her lower back and left lower extremity. She rates her pain 9. Her current exercise regime is walking and performing stretching exercises.  [name] [name] arrived to office with uncontrolled hypertension, she states she didn't take her medication his morning. She was educated on medication compliance, she verbalizes understanding.   Blood pressure was re-checked , she refuses ED or Urgent Care evaluation. [name]. [name] states she will go home and take her anti-hypertensive medication. She was instructed to keep a blood pressure log and F/U with her PCP, she verbalizes understanding.    [name]. [name] Morphine equivalent is 20.00 MME.   PCP prescribing Tramadol  [name]. Fentress reports her nephrologist is prescribing her gabapentin, she is taking her gabapentin two capsules ay HS.     Pain Inventory Average Pain 9 Pain Right Now 9 My pain is burning and aching  In the last 24 hours, has pain interfered with the following? General activity 0 Relation with others 0 Enjoyment of life 0 What TIME of day is your pain at its worst? varies Sleep (in general) Good  Pain is worse with: walking Pain improves with: therapy/exercise and medication Relief from Meds: 8  Family History  Problem Relation Age of Onset   Heart disease Mother    Asthma Mother    Cancer Mother        uterine    Depression Mother    Heart attack Mother 33   Hyperlipidemia Mother    Hypertension Mother    Stroke Mother    Kidney disease Mother    Heart attack Sister    Stroke Sister    Depression Sister    Diabetes Sister    Hyperlipidemia Sister    Depression Sister    Diabetes Sister    Hyperlipidemia Sister    HIV/AIDS Sister    Diabetes Brother    Asthma Brother     Hypertension Brother    Cancer Maternal Aunt        lung   Heart disease Maternal Grandmother    Heart attack Maternal Grandmother    Cancer Brother        colon   HIV/AIDS Brother    Colon cancer Neg Hx    Social History   Socioeconomic History   Marital status: Single    Spouse name: Not on file   Number of children: 3   Years of education: Not on file   Highest education level: Not on file  Occupational History   Occupation: DISABLED    Employer: DISABLED  Tobacco Use   Smoking status: Every Day    Current packs/day: 0.25    Average packs/day: 0.3 packs/day for 47.5 years (11.9 ttl pk-yrs)    Types: Cigarettes    Start date: 01/01/1976    Passive exposure: Current   Smokeless tobacco: Never   Tobacco comments:    ~ 3 cigarettes / day  Vaping Use   Vaping status: Never Used  Substance and Sexual Activity   Alcohol use: Not Currently   Drug use: Not Currently   Sexual activity: Yes  Other Topics Concern   Not on file  Social History Narrative   ** Merged History Encounter **  Health Care POA:  Emergency Contact: Bentlei Wiatr [phone] (c) End of Life Plan:  Who lives with you: Lives with husband Any pets: none Diet: Patient lacks financial resources for much food. Pt reports eating what is available. Exercise: Patient    does not have an exercise plan. Seatbelts: Patient reports wearing seatbelt when in vehicle.  [name] [name] Exposure/Protection: Hobbies: Bowling, computer games, Bingo  Has financial difficulties and transportation issues as she and her husband share transpo   rtation      Social Determinants of Health   Financial Resource Strain: Medium Risk (05/16/2023)   Overall Financial Resource Strain (CARDIA)    Difficulty of Paying Living Expenses: Somewhat hard  Food Insecurity: No Food Insecurity (05/16/2023)   Hunger Vital Sign    Worried About Running Out of Food in the Last Year: Never true    Ran Out of Food in the Last Year: Never true   Transportation Needs: No Transportation Needs (05/16/2023)   PRAPARE - Administrator, Civil Service (Medical): No    Lack of Transportation (Non-Medical): No  Physical Activity: Insufficiently Active (05/16/2023)   Exercise Vital Sign    Days of Exercise per Week: 3 days    Minutes of Exercise per Session: 30 min  Stress: No Stress Concern Present (05/16/2023)   [employer] of Occupational Health - Occupational Stress Questionnaire    Feeling of Stress : Not at all  Social Connections: Moderately Isolated (05/16/2023)   Social Connection and Isolation Panel [NHANES]    Frequency of Communication with Friends and Family: More than three times a week    Frequency of Social Gatherings with Friends and Family: Three times a week    Attends Religious Services: 1 to 4 times per year    Active Member of Clubs or Organizations: No    Attends Banker Meetings: Never    Marital Status: Never married   Past Surgical History:  Procedure Laterality Date   ABDOMINAL AORTOGRAM W/LOWER EXTREMITY Bilateral 08/12/2020   Procedure: ABDOMINAL AORTOGRAM W/BILATERAL LOWER EXTREMITY RUNOFF;  Surgeon: Nada Libman, MD;  Location: [facility];  Service: Cardiovascular;  Laterality: Bilateral;   ABI  02/2012   ABI <0.65 BL 02/2012   BRAIN MENINGIOMA EXCISION     BRAIN TUMOR EXCISION     Bypass grafting of RLE for PAD claudication      CARDIAC CATHETERIZATION  04/09/208   CESAREAN SECTION     1974, 78 ,80   CHOLECYSTECTOMY, LAPAROSCOPIC     COLONOSCOPY  2014   [place], [place]   ENDARTERECTOMY FEMORAL Right 10/03/2020   Procedure: RIGHT FEMORAL ENDARTERECTOMY WITH VEIN PATCH ANGIOPLASTY;  Surgeon: Nada Libman, MD;  Location: [facility] OR;  Service: Vascular;  Laterality: Right;   ESOPHAGEAL DILATION     FEMORAL-POPLITEAL BYPASS GRAFT Right 10/03/2020   Procedure: RIGHT FEMORAL- BELOW KNEE POPLITEAL BYPASS USING GORE PROPATEN GRAFT;  Surgeon: Nada Libman, MD;   Location: [facility] OR;  Service: Vascular;  Laterality: Right;   PATCH ANGIOPLASTY  10/03/2020   Procedure: VEIN PATCH ANGIOPLASTY;  Surgeon: Nada Libman, MD;  Location: [facility];  Service: Vascular;;   Past Surgical History:  Procedure Laterality Date   ABDOMINAL AORTOGRAM W/LOWER EXTREMITY Bilateral 08/12/2020   Procedure: ABDOMINAL AORTOGRAM W/BILATERAL LOWER EXTREMITY RUNOFF;  Surgeon: Nada Libman, MD;  [facility]: [facility];  Service: Cardiovascular;  Laterality: Bilateral;   ABI  02/2012   ABI <0.65 BL 02/2012   BRAIN  MENINGIOMA EXCISION     BRAIN TUMOR EXCISION     Bypass grafting of RLE for PAD claudication      CARDIAC CATHETERIZATION  04/09/208   CESAREAN SECTION     1974, 78 ,80   CHOLECYSTECTOMY, LAPAROSCOPIC     COLONOSCOPY  2014   [place], [place]   ENDARTERECTOMY FEMORAL Right 10/03/2020   Procedure: RIGHT FEMORAL ENDARTERECTOMY WITH VEIN PATCH ANGIOPLASTY;  Surgeon: Nada Libman, MD;  Location: [facility];  Service: Vascular;  Laterality: Right;   ESOPHAGEAL DILATION     FEMORAL-POPLITEAL BYPASS GRAFT Right 10/03/2020   Procedure: RIGHT FEMORAL- BELOW KNEE POPLITEAL BYPASS USING GORE PROPATEN GRAFT;  Surgeon: Nada Libman, MD;  Location: [facility];  Service: Vascular;  Laterality: Right;   PATCH ANGIOPLASTY  10/03/2020   Procedure: VEIN PATCH ANGIOPLASTY;  Surgeon: Nada Libman, MD;  Location: [facility];  Service: Vascular;;   Past Medical History:  Diagnosis Date   Allergy    environmental   Asthma    Brain tumor (HCC)    Chronic diastolic CHF (congestive heart failure) (HCC)    CKD (chronic kidney disease)    CLL (chronic lymphocytic leukemia) (HCC)    Constipation 11/15/2011   COPD (chronic obstructive pulmonary disease) (HCC)    CVA (cerebral vascular accident) (HCC)    Diabetes (HCC)    GERD (gastroesophageal reflux disease)    Headache    migraines   Hyperlipidemia    Hypertension    PVD (peripheral vascular disease) (HCC)    Stroke (HCC) 1998    in 1998 due to tumor-right side   BP (!) 185/85   Pulse 63   Ht 5\' 2"  (1.575 m)   Wt 136 lb (61.7 kg)   SpO2 95%   BMI 24.87 kg/m   Opioid Risk Score:   Fall Risk Score:  `1  Depression screen [facility] 2/9     05/16/2023   11:24 AM 04/13/2023    9:22 AM 01/14/2023   10:02 AM 12/24/2022    2:49 PM 09/23/2022    1:35 PM 09/02/2022    3:16 PM 08/02/2022    1:57 PM  Depression screen PHQ 2/9  Decreased Interest 0 0 0 0 0 0 0  Down, Depressed, Hopeless 0 0 0 0 0 0 0  PHQ - 2 Score 0 0 0 0 0 0 0  Altered sleeping    0 0  0  Tired, decreased energy    0 2 2 1   Change in appetite    0 0 0 0  Feeling bad or failure about yourself     0 0 0 0  Trouble concentrating    0 0 0 0  Moving slowly or fidgety/restless    0 0 0 0  Suicidal thoughts    0 0 0 0  PHQ-9 Score    0 2  1     Review of Systems  Musculoskeletal:        PAIN IN THE BACK OF LEFT LEG  All other systems reviewed and are negative.      Objective:   Physical Exam Vitals and nursing note reviewed.  Constitutional:      Appearance: Normal appearance.  Neck:     Comments: Cervical Paraspinal Tenderness: C-5-C-6 Cardiovascular:     Rate and Rhythm: Normal rate and regular rhythm.     Pulses: Normal pulses.     Heart sounds: Normal heart sounds.  Pulmonary:     Effort: Pulmonary effort  is normal.     Breath sounds: Normal breath sounds.  Musculoskeletal:     Cervical back: Normal range of motion and neck supple.     Comments: Normal Muscle Bulk and Muscle Testing Reveals:  Upper Extremities: Full ROM and Muscle Strength 5/5 Bilateral AC Joint Tenderness Lumbar Paraspinal Tenderness: L-3-L-5 Lower Extremities: Full ROM and Muscle Strength 5/5 Arises from Table with ease Narrow Based  Gait     Skin:    General: Skin is warm and dry.  Neurological:     Mental Status: She is alert and oriented to person, place, and time.  Psychiatric:        Mood and Affect: Mood normal.        Behavior: Behavior normal.          Assessment & Plan:  Lumbar Foraminal Stenosis/ Chronic Low Back Pain without Sciatica: Continue HEP as Tolerated. Continue to Monitor.  2. Chronic Pain Syndrome: Continue Tramadol: PCP prescribing. Continue Gabapentin. Continue to Monitor.  3. Uncontrolled Hypertension: Blood Pressure was re-checked She didn't take her her ant-hypertensive medication, educated on medication compliance. She refuses Ed or Urgent care evaluation. She states she will go home and take her medication, she was instructed to keep blood pressure log and F/U with her PCP.   F/U with Dr Berline Chough in 3 months

## 2023-07-14 ENCOUNTER — Telehealth: Payer: Self-pay

## 2023-07-14 NOTE — Progress Notes (Deleted)
    SUBJECTIVE:   CHIEF COMPLAINT / HPI: ***  ***  PERTINENT  PMH / PSH: HTN, PVD, T2DM, HFpEF, CKD, COPD, CLL  OBJECTIVE:   There were no vitals taken for this visit.  ***  ASSESSMENT/PLAN:   There are no diagnoses linked to this encounter. No follow-ups on file.  Celine Mans, MD [facility]

## 2023-07-14 NOTE — Telephone Encounter (Signed)
Patient calls nurse line regarding elevated BP readings. Most recent BP is 188/80. She denies headaches, blurry vision, chest pain, or shortness of breath.   Patient reports that she has not been taking BP medication.   Advised patient that we should schedule her appointment to discuss elevated BP readings.   Scheduled for tomorrow morning. Discussed ED precautions.   Veronda Prude, RN

## 2023-07-14 NOTE — Progress Notes (Cosign Needed Addendum)
    SUBJECTIVE:   CHIEF COMPLAINT / HPI:   Hypertension: - Medications: Amlodipine 10mg  - Compliance: Not taking - patient unaware she was supposed to be taking blood pressure medication - Checking BP at home: 188/80 home reading - Denies any SOB, CP, vision changes, LE edema, medication SEs, or symptoms of hypotension  Smoking cessation Set quit date of July 1st at last appointment. Now down to 2 cigarettes per day and states she has not smoked yet today. Only taking Chantix once per day  Diabetes Current Regimen: Jardiance 25mg , Metformin 500mg  daily, Trulicity 0.75 weekly CBGs: Monitored by home health nurse Last A1c:  Lab Results  Component Value Date   HGBA1C 7.5 (A) 07/15/2023    Denies polyuria, polydipsia, hypoglycemia. Last Eye Exam: Due - discuss at follow up Statin: Atorvastatin 80mg  daily ACE/ARB: No - adding Losartan this visit   PERTINENT  PMH / PSH: T2DM, HTN, tobacco use  OBJECTIVE:   BP (!) 172/74   Pulse 62   Ht 5' 1.81" (1.57 m)   Wt 133 lb 12.8 oz (60.7 kg)   SpO2 97%   BMI 24.62 kg/m   General: Alert, no apparent distress, well groomed HEENT: Normocephalic, atraumatic, moist mucus membranes, neck supple Respiratory: Normal respiratory effort GI: Non-distended Skin: No rashes, no jaundice Psych: Appropriate mood and affect  ASSESSMENT/PLAN:   Essential hypertension Uncontrolled, SBP 180s at home. 172/74 today. Not taking any medication, patient unaware she was prescribed Amlodipine and has not taken for some time. Asymptomatic. -Restart Amlodipine 10mg  -Start Losartan 50mg  daily and f/u for BP check and BMP with PCP  DM (diabetes mellitus), type 2 with peripheral vascular complications (HCC) A1c 7.5, down from 8.5 in 03/2023. Compliant on Metformin 500mg  daily and Jardiance 25mg  daily. Taking Atorvastatin 80mg  daily. -Increase to Metformin XR 1000mg  daily -Due for eye exam, discuss at follow up  TOBACCO DEPENDENCE Down to 2 cigarettes  daily. Taking Chantix only once per day. -Advised to take Chantix BID as prescribed -Encouraged patient to consider quitting by appointment with PCP on 8/1   Dr. Elberta Fortis, DO [facility]

## 2023-07-15 ENCOUNTER — Ambulatory Visit (INDEPENDENT_AMBULATORY_CARE_PROVIDER_SITE_OTHER): Payer: 59 | Admitting: Family Medicine

## 2023-07-15 VITALS — BP 172/74 | HR 62 | Ht 61.81 in | Wt 133.8 lb

## 2023-07-15 DIAGNOSIS — Z716 Tobacco abuse counseling: Secondary | ICD-10-CM

## 2023-07-15 DIAGNOSIS — E1169 Type 2 diabetes mellitus with other specified complication: Secondary | ICD-10-CM

## 2023-07-15 DIAGNOSIS — E1151 Type 2 diabetes mellitus with diabetic peripheral angiopathy without gangrene: Secondary | ICD-10-CM

## 2023-07-15 DIAGNOSIS — F17200 Nicotine dependence, unspecified, uncomplicated: Secondary | ICD-10-CM

## 2023-07-15 DIAGNOSIS — I10 Essential (primary) hypertension: Secondary | ICD-10-CM

## 2023-07-15 LAB — POCT GLYCOSYLATED HEMOGLOBIN (HGB A1C): HbA1c, POC (controlled diabetic range): 7.5 % — AB (ref 0.0–7.0)

## 2023-07-15 MED ORDER — METFORMIN HCL ER 500 MG PO TB24
1000.0000 mg | ORAL_TABLET | Freq: Every day | ORAL | 1 refills | Status: DC
Start: 2023-07-15 — End: 2023-11-01

## 2023-07-15 MED ORDER — AMLODIPINE BESYLATE 10 MG PO TABS: 10.0000 mg | ORAL_TABLET | Freq: Every day | ORAL | 3 refills | Status: DC

## 2023-07-15 MED ORDER — LOSARTAN POTASSIUM 50 MG PO TABS
50.0000 mg | ORAL_TABLET | Freq: Every day | ORAL | 3 refills | Status: DC
Start: 2023-07-15 — End: 2024-04-17

## 2023-07-15 MED ORDER — VARENICLINE TARTRATE 1 MG PO TABS: 1.0000 mg | ORAL_TABLET | Freq: Two times a day (BID) | ORAL | 1 refills | Status: DC

## 2023-07-15 NOTE — Assessment & Plan Note (Signed)
Uncontrolled, SBP 180s at home. 172/74 today. Not taking any medication, patient unaware she was prescribed Amlodipine and has not taken for some time. Asymptomatic. -Restart Amlodipine 10mg  -Start Losartan 50mg  daily and f/u for BP check and BMP with PCP

## 2023-07-15 NOTE — Assessment & Plan Note (Signed)
A1c 7.5, down from 8.5 in 03/2023. Compliant on Metformin 500mg  daily and Jardiance 25mg  daily. Taking Atorvastatin 80mg  daily. -Increase to Metformin XR 1000mg  daily -Due for eye exam, discuss at follow up

## 2023-07-15 NOTE — Patient Instructions (Addendum)
It was wonderful to see you today! Thank you for choosing [facility].   Please bring ALL of your medications with you to every visit.   Today we talked about:  Please start taking Amlodipine and Losartan daily for your blood pressure. I included a log below to track your blood pressure for the next 2 weeks. Please take Metformin 1000mg  (2 pills) every day for your diabetes. Congratulations on your A1c improving! Please take the Chantix twice per day for smoking cessation. Keep up the good work to stop smoking.  Please follow up in 2 weeks for blood pressure check  Call the clinic at [phone] if your symptoms worsen or you have any concerns.  Please be sure to schedule follow up at the front desk before you leave today.   Elberta Fortis, DO Family Medicine    Blood Pressure Record Sheet To take your blood pressure, you will need a blood pressure machine. You can buy a blood pressure machine (blood pressure monitor) at your clinic, drug store, or online. When choosing one, consider: An automatic monitor that has an arm cuff. A cuff that wraps snugly around your upper arm. You should be able to fit only one finger between your arm and the cuff. A device that stores blood pressure reading results. Do not choose a monitor that measures your blood pressure from your wrist or finger. Follow your health care provider's instructions for how to take your blood pressure. To use this form: Take your blood pressure medications every day These measurements should be taken when you have been at rest for at least 10-15 min Take at least 2 readings with each blood pressure check. This makes sure the results are correct. Wait 1-2 minutes between measurements. Write down the results in the spaces on this form. Keep in mind it should always be recorded systolic over diastolic. Both numbers are important.  Repeat this every day for 2-3 weeks, or as told by your health care  provider.  Make a follow-up appointment with your health care provider to discuss the results.  Blood Pressure Log Date Medications taken? (Y/N) Blood Pressure Time of Day

## 2023-07-15 NOTE — Assessment & Plan Note (Addendum)
Down to 2 cigarettes daily. Taking Chantix only once per day. -Advised to take Chantix BID as prescribed -Encouraged patient to consider quitting by appointment with PCP on 8/1

## 2023-07-16 ENCOUNTER — Other Ambulatory Visit (HOSPITAL_BASED_OUTPATIENT_CLINIC_OR_DEPARTMENT_OTHER): Payer: Self-pay | Admitting: Cardiology

## 2023-07-16 DIAGNOSIS — I10 Essential (primary) hypertension: Secondary | ICD-10-CM

## 2023-07-21 ENCOUNTER — Other Ambulatory Visit: Payer: Self-pay

## 2023-07-22 ENCOUNTER — Encounter: Payer: Self-pay | Admitting: Family Medicine

## 2023-07-22 MED ORDER — EMPAGLIFLOZIN 25 MG PO TABS: 25.0000 mg | ORAL_TABLET | Freq: Every day | ORAL | 4 refills | Status: DC

## 2023-07-28 ENCOUNTER — Encounter: Payer: Self-pay | Admitting: Family Medicine

## 2023-07-28 ENCOUNTER — Ambulatory Visit (INDEPENDENT_AMBULATORY_CARE_PROVIDER_SITE_OTHER): Payer: 59 | Admitting: Family Medicine

## 2023-07-28 VITALS — BP 124/64 | HR 58 | Wt 133.0 lb

## 2023-07-28 DIAGNOSIS — R0789 Other chest pain: Secondary | ICD-10-CM

## 2023-07-28 DIAGNOSIS — E7800 Pure hypercholesterolemia, unspecified: Secondary | ICD-10-CM | POA: Diagnosis not present

## 2023-07-28 DIAGNOSIS — I5032 Chronic diastolic (congestive) heart failure: Secondary | ICD-10-CM

## 2023-07-28 DIAGNOSIS — J438 Other emphysema: Secondary | ICD-10-CM

## 2023-07-28 MED ORDER — FUROSEMIDE 20 MG PO TABS
20.0000 mg | ORAL_TABLET | Freq: Every day | ORAL | 11 refills | Status: DC
Start: 2023-07-28 — End: 2023-09-22

## 2023-07-28 MED ORDER — ATORVASTATIN CALCIUM 80 MG PO TABS: 80.0000 mg | ORAL_TABLET | Freq: Every day | ORAL | 1 refills | Status: DC

## 2023-07-28 MED ORDER — ALBUTEROL SULFATE HFA 108 (90 BASE) MCG/ACT IN AERS
2.0000 | INHALATION_SPRAY | RESPIRATORY_TRACT | 11 refills | Status: DC | PRN
Start: 2023-07-28 — End: 2024-05-10

## 2023-07-28 MED ORDER — MONTELUKAST SODIUM 10 MG PO TABS
10.0000 mg | ORAL_TABLET | Freq: Every day | ORAL | 11 refills | Status: DC
Start: 2023-07-28 — End: 2024-05-29

## 2023-07-28 MED ORDER — NITROGLYCERIN 0.4 MG SL SUBL
SUBLINGUAL_TABLET | SUBLINGUAL | 11 refills | Status: DC
Start: 2023-07-28 — End: 2023-08-23

## 2023-07-28 NOTE — Progress Notes (Signed)
    SUBJECTIVE:   CHIEF COMPLAINT / HPI:   Med Refill: [name] [name] is visiting clinic today for medication refill. She has been running low or out of her Jaridance, albuterol inhaler and nebulizer, atorvastatin, lasix, Singulair, and nitroglycerin.   Her blood glucose levels at home have ranged from 100-200s, no recent hypoglycemic measures. Fingerprick blood glucose checks have been difficult to tolerate and she would be interested in a Dexcom. She has had increased difficulty breathing since running out of her meds, describing difficulty walking or standing at times without becoming short of breath. She has intermittent chest pain episodes 10-20 mins that occur up to 2 times a week, with some relief from nitroglycerin. Had an episode of chest pain last night that lasted a few seconds and she thinks it may have been indigestion. Last episode of chest pain lasting for minutes was over a week ago. She continues to have swelling in her lower extremities, particularly her RLE.  PERTINENT  PMH / PSH: T2DM, HFpEF, COPD, CKDIII, HTN, PVD, PAD  OBJECTIVE:   BP 124/64   Pulse (!) 58   Wt 133 lb (60.3 kg)   SpO2 98%   BMI 24.48 kg/m   General: Well-appearing, sitting comfortably in chair Cardio: 2/6 blowing systolic murmur. No S3/S4. Warm and well-perfused. Pulm: CTAB. Diminished aeration throughout. Abd: Soft, nontender, nondistended MSK: 2+ pitting edema in RLE, painful to palpation. No LLE swelling or pain to palpation. Moves all extremities spontaneously.  Skin: Warm, dry Psych: Alert and oriented. Conversational and energetic.   ASSESSMENT/PLAN:   Medication Refill / Management of Chronic Conditions: Overall, patient's chronic conditions appear stable at this time. RLE edema appears consistent with known history.  - refills for Jardiance, albuterol, atoravastatin, lasix, singulair, and nitroglycerin sent to patient's preferred pharmacy - f/u with patient's oncologist about CLL  treatment - f/u with Dr Raymondo Band about Dexcom placement  - patient advised to call to make an appointment with her cardiologist - patient advised to ask about medications at her oncology appointment later this month  Chest pain: Discussed conducting an EKG today given recent chest pain. Patient not interested at this time, noting last night's events were likely due to indigestion.  - Patient advised to visit ER for worsening symptoms - Refill nitroglycerin - patient advised to schedule appt with her cardiologist  Patient to f/u in clinic in 1 week for reassessment.   Ivery Quale, MD [facility]

## 2023-07-28 NOTE — Progress Notes (Incomplete)
    SUBJECTIVE:   CHIEF COMPLAINT / HPI:   [name] [name] is visiting clinic today for medication ref  PERTINENT  PMH / PSH: ***  OBJECTIVE:   BP 124/64   Pulse (!) 58   Wt 133 lb (60.3 kg)   SpO2 98%   BMI 24.48 kg/m   ***  ASSESSMENT/PLAN:   No problem-specific Assessment & Plan notes found for this encounter.     Ivery Quale, MD [facility]

## 2023-07-28 NOTE — Patient Instructions (Addendum)
Thank you for visiting today - it was great to meet you!  Today, we discussed and refilled your medications. For your chest pain, please reach out to your cardiologist [phone]) to schedule a follow up appointment to check in. If you have chest pain at home, please go straight to the Emergency Room.  Please come back here for a follow up appointment in clinic in 1 week.   We will discuss with our pharmacist about Dexcom options.

## 2023-07-29 ENCOUNTER — Telehealth: Payer: Self-pay | Admitting: Pharmacist

## 2023-07-29 ENCOUNTER — Ambulatory Visit: Payer: 59 | Admitting: Hematology

## 2023-07-29 ENCOUNTER — Other Ambulatory Visit: Payer: 59

## 2023-07-29 MED ORDER — ALBUTEROL SULFATE (2.5 MG/3ML) 0.083% IN NEBU: 2.5000 mg | INHALATION_SOLUTION | Freq: Four times a day (QID) | RESPIRATORY_TRACT | 11 refills | Status: DC | PRN

## 2023-07-29 MED ORDER — EMPAGLIFLOZIN 25 MG PO TABS: 25.0000 mg | ORAL_TABLET | Freq: Every day | ORAL | 4 refills | Status: DC

## 2023-07-29 NOTE — Telephone Encounter (Signed)
Reviewed and agree with Dr Koval's plan.   

## 2023-07-29 NOTE — Telephone Encounter (Signed)
Asked by Dr. Threasa Beards to assist with potential DEXCOM CGM initiation.   Most recent A1c was improved to 7.5 Patient appears to be adherent to her GLP/SGLT2/metformin regimen.  At this time, patient is NOT taking insulin.   Discussed current control and lack of insulin likely preclude use of insulin at this time per insurance converage.  Patient verbalized understanding and thank me for the call.   If patient is started on insulin in the future, possibly consider CGM at that time.   Total time with chart review, patient contact and documentation 9 minutes.  No additional contact planned.

## 2023-07-29 NOTE — Telephone Encounter (Signed)
-----   Message from Ivery Quale sent at 07/29/2023 12:25 AM EDT ----- Regarding: Possible Dexcom placement Hi Dr Raymondo Band,  Happy Friday! I hope you've had a wonderful week. This patient was interested in a Dexcom placement. Would it be possible for her to do so in clinic with you? Thanks so much in advance for your thoughts!  [place]armly, Ivery Quale [id number]

## 2023-08-01 ENCOUNTER — Other Ambulatory Visit: Payer: Self-pay

## 2023-08-01 DIAGNOSIS — M545 Low back pain, unspecified: Secondary | ICD-10-CM

## 2023-08-01 MED ORDER — TRAMADOL HCL 50 MG PO TABS
50.0000 mg | ORAL_TABLET | Freq: Two times a day (BID) | ORAL | 0 refills | Status: DC | PRN
Start: 2023-08-19 — End: 2023-09-08

## 2023-08-03 ENCOUNTER — Telehealth: Payer: Self-pay | Admitting: Family Medicine

## 2023-08-03 NOTE — Telephone Encounter (Signed)
Called patient to check in on symptoms. Patient did not answer, left voicemail.

## 2023-08-04 ENCOUNTER — Ambulatory Visit: Payer: 59 | Admitting: Family Medicine

## 2023-08-04 NOTE — Progress Notes (Deleted)
    SUBJECTIVE:   CHIEF COMPLAINT / HPI:   Declined EKG at last visit last week  PERTINENT  PMH / PSH: ***  OBJECTIVE:   There were no vitals taken for this visit.  ***  ASSESSMENT/PLAN:   No problem-specific Assessment & Plan notes found for this encounter.     Ivery Quale, MD [facility]

## 2023-08-09 ENCOUNTER — Other Ambulatory Visit: Payer: Self-pay

## 2023-08-09 DIAGNOSIS — C9110 Chronic lymphocytic leukemia of B-cell type not having achieved remission: Secondary | ICD-10-CM

## 2023-08-10 ENCOUNTER — Inpatient Hospital Stay: Payer: 59 | Attending: Hematology

## 2023-08-10 ENCOUNTER — Inpatient Hospital Stay: Payer: 59 | Admitting: Hematology

## 2023-08-10 VITALS — BP 147/65 | HR 63 | Resp 18 | Wt 135.0 lb

## 2023-08-10 DIAGNOSIS — Z808 Family history of malignant neoplasm of other organs or systems: Secondary | ICD-10-CM | POA: Diagnosis not present

## 2023-08-10 DIAGNOSIS — C9110 Chronic lymphocytic leukemia of B-cell type not having achieved remission: Secondary | ICD-10-CM

## 2023-08-10 DIAGNOSIS — Z800 Family history of malignant neoplasm of digestive organs: Secondary | ICD-10-CM | POA: Diagnosis not present

## 2023-08-10 DIAGNOSIS — F17210 Nicotine dependence, cigarettes, uncomplicated: Secondary | ICD-10-CM | POA: Diagnosis not present

## 2023-08-10 DIAGNOSIS — Z801 Family history of malignant neoplasm of trachea, bronchus and lung: Secondary | ICD-10-CM | POA: Diagnosis not present

## 2023-08-10 DIAGNOSIS — R918 Other nonspecific abnormal finding of lung field: Secondary | ICD-10-CM | POA: Diagnosis not present

## 2023-08-10 DIAGNOSIS — Z79899 Other long term (current) drug therapy: Secondary | ICD-10-CM | POA: Diagnosis not present

## 2023-08-10 LAB — CMP (CANCER CENTER ONLY)
ALT: 22 U/L (ref 0–44)
AST: 18 U/L (ref 15–41)
Albumin: 4 g/dL (ref 3.5–5.0)
Alkaline Phosphatase: 161 U/L — ABNORMAL HIGH (ref 38–126)
Anion gap: 3 — ABNORMAL LOW (ref 5–15)
BUN: 10 mg/dL (ref 8–23)
CO2: 32 mmol/L (ref 22–32)
Calcium: 10 mg/dL (ref 8.9–10.3)
Chloride: 107 mmol/L (ref 98–111)
Creatinine: 1.29 mg/dL — ABNORMAL HIGH (ref 0.44–1.00)
GFR, Estimated: 43 mL/min — ABNORMAL LOW (ref >60)
Glucose, Bld: 101 mg/dL — ABNORMAL HIGH (ref 70–99)
Potassium: 4.3 mmol/L (ref 3.5–5.1)
Sodium: 142 mmol/L (ref 135–145)
Total Bilirubin: 0.5 mg/dL (ref 0.3–1.2)
Total Protein: 6.4 g/dL — ABNORMAL LOW (ref 6.5–8.1)

## 2023-08-10 LAB — CBC WITH DIFFERENTIAL (CANCER CENTER ONLY)
Abs Immature Granulocytes: 0.05 10*3/uL (ref 0.00–0.07)
Basophils Absolute: 0.1 10*3/uL (ref 0.0–0.1)
Basophils Relative: 1 %
Eosinophils Absolute: 0.1 10*3/uL (ref 0.0–0.5)
Eosinophils Relative: 2 %
HCT: 43.6 % (ref 36.0–46.0)
Hemoglobin: 13.9 g/dL (ref 12.0–15.0)
Immature Granulocytes: 1 %
Lymphocytes Relative: 38 %
Lymphs Abs: 2.6 10*3/uL (ref 0.7–4.0)
MCH: 28.8 pg (ref 26.0–34.0)
MCHC: 31.9 g/dL (ref 30.0–36.0)
MCV: 90.3 fL (ref 80.0–100.0)
Monocytes Absolute: 0.9 10*3/uL (ref 0.1–1.0)
Monocytes Relative: 13 %
Neutro Abs: 3.1 10*3/uL (ref 1.7–7.7)
Neutrophils Relative %: 45 %
Platelet Count: 158 10*3/uL (ref 150–400)
RBC: 4.83 MIL/uL (ref 3.87–5.11)
RDW: 18 % — ABNORMAL HIGH (ref 11.5–15.5)
WBC Count: 6.7 10*3/uL (ref 4.0–10.5)
nRBC: 0 % (ref 0.0–0.2)

## 2023-08-10 LAB — LACTATE DEHYDROGENASE: LDH: 132 U/L (ref 98–192)

## 2023-08-16 ENCOUNTER — Encounter: Payer: Self-pay | Admitting: Hematology

## 2023-08-16 NOTE — Progress Notes (Signed)
HEMATOLOGY/ONCOLOGY CLINIC NOTE  Date of Service: ..08/10/2023  Patient Care Team: Ivery Quale, MD as PCP - General Quintella Reichert, MD as PCP - Cardiology (Cardiology) Juanell Fairly, RN as Case Manager Candise Che, Corene Cornea, MD as Consulting Physician (Hematology) Conley Rolls, [place], [place] as Referring Physician (Optometry)  CHIEF COMPLAINTS/PURPOSE OF CONSULTATION:  Follow-up for continued evaluation and management of CLL  HISTORY OF PRESENTING ILLNESS:  Please see previous note for details on initial presentation  INTERVAL HISTORY:  [name] BRAUTIGAN is here for her 55-month follow-up for CLL. She notes no acute new symptoms since her last clinic visit. No new lumps or bumps.  No fevers no chills no night sweats. Good p.o. intake and stable weight. No unexpected weight loss. Labs done today were discussed with her in details.  MEDICAL HISTORY:  Past Medical History:  Diagnosis Date   Allergy    environmental   Asthma    Brain tumor (HCC)    Chronic diastolic CHF (congestive heart failure) (HCC)    CKD (chronic kidney disease)    CLL (chronic lymphocytic leukemia) (HCC)    Constipation 11/15/2011   COPD (chronic obstructive pulmonary disease) (HCC)    CVA (cerebral vascular accident) (HCC)    Diabetes (HCC)    GERD (gastroesophageal reflux disease)    Headache    migraines   Hyperlipidemia    Hypertension    PVD (peripheral vascular disease) (HCC)    Stroke (HCC) 1998   in 1998 due to tumor-right side    SURGICAL HISTORY: Past Surgical History:  Procedure Laterality Date   ABDOMINAL AORTOGRAM W/LOWER EXTREMITY Bilateral 08/12/2020   Procedure: ABDOMINAL AORTOGRAM W/BILATERAL LOWER EXTREMITY RUNOFF;  Surgeon: Nada Libman, MD;  Location: [facility];  Service: Cardiovascular;  Laterality: Bilateral;   ABI  02/2012   ABI <0.65 BL 02/2012   BRAIN MENINGIOMA EXCISION     BRAIN TUMOR EXCISION     Bypass grafting of RLE for PAD claudication      CARDIAC  CATHETERIZATION  04/09/208   CESAREAN SECTION     1974, 78 ,80   CHOLECYSTECTOMY, LAPAROSCOPIC     COLONOSCOPY  2014   [place], [place]   ENDARTERECTOMY FEMORAL Right 10/03/2020   Procedure: RIGHT FEMORAL ENDARTERECTOMY WITH VEIN PATCH ANGIOPLASTY;  Surgeon: Nada Libman, MD;  Location: [facility] OR;  Service: Vascular;  Laterality: Right;   ESOPHAGEAL DILATION     FEMORAL-POPLITEAL BYPASS GRAFT Right 10/03/2020   Procedure: RIGHT FEMORAL- BELOW KNEE POPLITEAL BYPASS USING GORE PROPATEN GRAFT;  Surgeon: Nada Libman, MD;  Location: [facility];  Service: Vascular;  Laterality: Right;   PATCH ANGIOPLASTY  10/03/2020   Procedure: VEIN PATCH ANGIOPLASTY;  Surgeon: Nada Libman, MD;  Location: [facility] OR;  Service: Vascular;;    SOCIAL HISTORY: Social History   Socioeconomic History   Marital status: Single    Spouse name: Not on file   Number of children: 3   Years of education: Not on file   Highest education level: Not on file  Occupational History   Occupation: DISABLED    Employer: DISABLED  Tobacco Use   Smoking status: Every Day    Current packs/day: 0.25    Average packs/day: 0.3 packs/day for 47.6 years (11.9 ttl pk-yrs)    Types: Cigarettes    Start date: 01/01/1976    Passive exposure: Current   Smokeless tobacco: Never   Tobacco comments:    ~ 3 cigarettes / day  Vaping  Use   Vaping status: Never Used  Substance and Sexual Activity   Alcohol use: Not Currently   Drug use: Not Currently   Sexual activity: Yes  Other Topics Concern   Not on file  Social History Narrative   ** Merged History Encounter **       Health Care POA:  Emergency Contact: Aidaly Escatel [phone] (c) End of Life Plan:  Who lives with you: Lives with husband Any pets: none Diet: Patient lacks financial resources for much food. Pt reports eating what is available. Exercise: Patient    does not have an exercise plan. Seatbelts: Patient reports wearing seatbelt when in vehicle.  [name]  Exposure/Protection: Hobbies: Bowling, computer games, Bingo  Has financial difficulties and transportation issues as she and her husband share transpo   rtation      Social Determinants of Health   Financial Resource Strain: Medium Risk (05/16/2023)   Overall Financial Resource Strain (CARDIA)    Difficulty of Paying Living Expenses: Somewhat hard  Food Insecurity: No Food Insecurity (05/16/2023)   Hunger Vital Sign    Worried About Running Out of Food in the Last Year: Never true    Ran Out of Food in the Last Year: Never true  Transportation Needs: No Transportation Needs (05/16/2023)   PRAPARE - Administrator, Civil Service (Medical): No    Lack of Transportation (Non-Medical): No  Physical Activity: Insufficiently Active (05/16/2023)   Exercise Vital Sign    Days of Exercise per Week: 3 days    Minutes of Exercise per Session: 30 min  Stress: No Stress Concern Present (05/16/2023)   [employer] of Occupational Health - Occupational Stress Questionnaire    Feeling of Stress : Not at all  Social Connections: Moderately Isolated (05/16/2023)   Social Connection and Isolation Panel [NHANES]    Frequency of Communication with Friends and Family: More than three times a week    Frequency of Social Gatherings with Friends and Family: Three times a week    Attends Religious Services: 1 to 4 times per year    Active Member of Clubs or Organizations: No    Attends Banker Meetings: Never    Marital Status: Never married  Intimate Partner Violence: Not At Risk (05/16/2023)   Humiliation, Afraid, Rape, and Kick questionnaire    Fear of Current or Ex-Partner: No    Emotionally Abused: No    Physically Abused: No    Sexually Abused: No    FAMILY HISTORY: Family History  Problem Relation Age of Onset   Heart disease Mother    Asthma Mother    Cancer Mother        uterine    Depression Mother    Heart attack Mother 48   Hyperlipidemia Mother     Hypertension Mother    Stroke Mother    Kidney disease Mother    Heart attack Sister    Stroke Sister    Depression Sister    Diabetes Sister    Hyperlipidemia Sister    Depression Sister    Diabetes Sister    Hyperlipidemia Sister    HIV/AIDS Sister    Diabetes Brother    Asthma Brother    Hypertension Brother    Cancer Maternal Aunt        lung   Heart disease Maternal Grandmother    Heart attack Maternal Grandmother    Cancer Brother        colon   HIV/AIDS Brother  Colon cancer Neg Hx     ALLERGIES:  is allergic to gazyva [obinutuzumab], aspirin, no healthtouch food allergies, aspirin, and baclofen.  MEDICATIONS:  Current Outpatient Medications  Medication Sig Dispense Refill   acetaminophen (TYLENOL) 500 MG tablet Take 500-1,000 mg by mouth every 6 (six) hours as needed for moderate pain.     albuterol (PROVENTIL) (2.5 MG/3ML) 0.083% nebulizer solution Take 3 mLs (2.5 mg total) by nebulization every 6 (six) hours as needed for wheezing or shortness of breath. 75 mL 11   albuterol (VENTOLIN HFA) 108 (90 Base) MCG/ACT inhaler Inhale 2 puffs into the lungs every 4 (four) hours as needed for wheezing or shortness of breath. 8.5 each 11   Alcohol Swabs (ALCOHOL PADS) 70 % PADS      amLODipine (NORVASC) 10 MG tablet Take 1 tablet (10 mg total) by mouth daily. 90 tablet 3   atorvastatin (LIPITOR) 80 MG tablet Take 1 tablet (80 mg total) by mouth daily. 90 tablet 1   Blood Glucose Calibration (OT ULTRA/FASTTK CNTRL SOLN) SOLN See admin instructions.     blood glucose meter kit and supplies KIT Dispense based on patient and insurance preference. Use up to four times daily as directed. 1 each 0   Blood Glucose Monitoring Suppl (ACCU-CHEK GUIDE ME) w/Device KIT Use to test blood sugar twice per day. E11.9 1 kit 1   BREO ELLIPTA 100-25 MCG/ACT AEPB Inhale 1 puff by mouth every day 60 each 11   clopidogrel (PLAVIX) 75 MG tablet Take 1 tablet by mouth every day 30 tablet 11    DULoxetine (CYMBALTA) 60 MG capsule Take 1 capsule by mouth every day 30 capsule 11   empagliflozin (JARDIANCE) 25 MG TABS tablet Take 1 tablet (25 mg total) by mouth daily. 30 tablet 4   famotidine (PEPCID) 20 MG tablet Take 1 tablet by mouth twice daily 60 tablet 11   furosemide (LASIX) 20 MG tablet Take 1 tablet (20 mg total) by mouth daily. 30 tablet 11   gabapentin (NEURONTIN) 300 MG capsule Take 2 capsules by mouth twice daily. Need MD appt 120 capsule 11   glucose blood (ONETOUCH VERIO) test strip Use to check blood sugar three times daily. 100 each 12   iron polysaccharides (NIFEREX) 150 MG capsule Take 1 capsule (150 mg total) by mouth daily. (Patient not taking: Reported on 07/28/2023) 30 capsule 5   Lancet Devices (EASY MINI EJECT LANCING DEVICE) MISC daily.     Lancets (ONETOUCH DELICA PLUS LANCET33G) MISC USE   TO CHECK GLUCOSE ONCE DAILY 100 each 0   losartan (COZAAR) 50 MG tablet Take 1 tablet (50 mg total) by mouth at bedtime. 90 tablet 3   metFORMIN (GLUCOPHAGE-XR) 500 MG 24 hr tablet Take 2 tablets (1,000 mg total) by mouth daily with breakfast. 180 tablet 1   methocarbamol (ROBAXIN) 500 MG tablet Take 1 tablet (500 mg total) by mouth every 8 (eight) hours as needed for muscle spasms. 90 tablet 5   montelukast (SINGULAIR) 10 MG tablet Take 1 tablet (10 mg total) by mouth at bedtime. 30 tablet 11   nitroGLYCERIN (NITROSTAT) 0.4 MG SL tablet Dissolve 1 tablet under tongue every 5 minutes, up to 3 doses for chest pain 25 tablet 11   [START ON 08/19/2023] traMADol (ULTRAM) 50 MG tablet Take 1 tablet (50 mg total) by mouth every 12 (twelve) hours as needed. 60 tablet 0   TRULICITY 0.75 MG/0.5ML SOPN Inject 0.75 mg subcutaneously once a week 2 mL 11  varenicline (CHANTIX CONTINUING MONTH PAK) 1 MG tablet Take 1 tablet (1 mg total) by mouth 2 (two) times daily. 60 tablet 1   venetoclax (VENCLEXTA) 100 MG tablet Take 2 tablets (200 mg total) by mouth daily. Tablets should be swallowed whole  with a meal and a full glass of water. (Patient not taking: Reported on 07/28/2023) 60 tablet 2   No current facility-administered medications for this visit.    REVIEW OF SYSTEMS:    .10 Point review of Systems was done is negative except as noted above.  PHYSICAL EXAMINATION: ECOG FS:2 - Symptomatic, <50% confined to bed  Vitals:   08/10/23 1500  BP: (!) 147/65  Pulse: 63  Resp: 18  SpO2: 100%     Wt Readings from Last 3 Encounters:  08/10/23 135 lb (61.2 kg)  07/28/23 133 lb (60.3 kg)  07/15/23 133 lb 12.8 oz (60.7 kg)   Body mass index is 24.84 kg/m.   Marland Kitchen GENERAL:alert, in no acute distress and comfortable SKIN: no acute rashes, no significant lesions EYES: conjunctiva are pink and non-injected, sclera anicteric OROPHARYNX: MMM, no exudates, no oropharyngeal erythema or ulceration NECK: supple, no JVD LYMPH:  no palpable lymphadenopathy in the cervical, axillary or inguinal regions LUNGS: clear to auscultation b/l with normal respiratory effort HEART: regular rate & rhythm ABDOMEN:  normoactive bowel sounds , non tender, not distended.  No palpable hepatosplenomegaly Extremity: no pedal edema PSYCH: alert & oriented x 3 with fluent speech NEURO: no focal motor/sensory deficits     LABORATORY DATA:  I have reviewed the data as listed      Latest Ref Rng & Units 08/10/2023    2:20 PM 01/28/2023    9:05 AM 08/30/2022   11:51 PM  CBC  WBC 4.0 - 10.5 K/uL 6.7  7.2  7.5   Hemoglobin 12.0 - 15.0 g/dL 62.9  52.8  41.3   Hematocrit 36.0 - 46.0 % 43.6  43.0  40.7   Platelets 150 - 400 K/uL 158  154  146        Latest Ref Rng & Units 08/10/2023    2:20 PM 01/28/2023    9:05 AM 01/14/2023   11:02 AM  CMP  Glucose 70 - 99 mg/dL 244  010    BUN 8 - 23 mg/dL 10  9    Creatinine 2.72 - 1.00 mg/dL 5.36  6.44    Sodium 034 - 145 mmol/L 142  140    Potassium 3.5 - 5.1 mmol/L 4.3  4.2    Chloride 98 - 111 mmol/L 107  102    CO2 22 - 32 mmol/L 32  31    Calcium 8.9 - 10.3  mg/dL 74.2  9.7    Total Protein 6.5 - 8.1 g/dL 6.4  6.2    Total Bilirubin 0.3 - 1.2 mg/dL 0.5  0.7    Alkaline Phos 38 - 126 U/L 161  156    AST 15 - 41 U/L 18  18    ALT 0 - 44 U/L 22  16  19      Lab Results  Component Value Date   LDH 132 08/10/2023         . Lab Results  Component Value Date   LDH 132 08/10/2023    RADIOGRAPHIC STUDIES: I have personally reviewed the radiological images as listed and agreed with the findings in the report. No results found.  Surgical Pathology 12/13/2017   ASSESSMENT & PLAN:   JADEY RODEN  is a wonderful 77 y.o. female with    1. Rai 1 Chronic Lymphocytic Leukemia  - Trisomy 12 mutation. Status post treatment with Gazyva plus venetoclax.  Currently on active surveillance.  #2 .Lung nodules on CT in 12/2017. Rpt CT chest 08/09/2018 - stable  #3 Patient Active Problem List   Diagnosis Date Noted   Foraminal stenosis of lumbosacral region 01/14/2023   Right leg swelling 09/03/2022   Meningioma (HCC) 09/03/2022   Polypharmacy 04/07/2022   History of home oxygen therapy 11/06/2021   Iron deficiency anemia 02/12/2021   Peripheral arterial disease (HCC) 10/03/2020   (HFpEF) heart failure with preserved ejection fraction (HCC) 06/11/2020   Muscle spasms of neck 03/07/2020   Venous insufficiency (chronic) (peripheral) 06/22/2019   Pulmonary nodules 01/04/2018   Mass of left lower leg 01/04/2018   Chronic kidney disease (CKD), stage III (moderate) (HCC) 06/04/2016   Chronic back pain 10/02/2015   DM (diabetes mellitus), type 2 with peripheral vascular complications (HCC) 06/11/2009   Chronic lymphocytic leukemia (HCC) 04/11/2009   PERIPHERAL VASCULAR DISEASE 10/13/2007   HYPERCHOLESTEROLEMIA 02/23/2007   TOBACCO DEPENDENCE 02/23/2007   Essential hypertension 02/23/2007   COPD (chronic obstructive pulmonary disease) (HCC) 02/23/2007   GASTROESOPHAGEAL REFLUX, NO ESOPHAGITIS 02/23/2007    PLAN: -Patient's labs were  discussed in detail with her . CBC is stable no lymphocytosis CMP stable LDH within normal limits Patient has no lab or clinical evidence of CLL progression at this time. No indication for additional treatment of the patient's CLL at this time. We discussed the importance of staying up-to-date with her vaccines. FOLLOW UP: Return to clinic with Dr. Candise Che with labs in 6 months  The total time spent in the appointment was 20 minutes*.  All of the patient's questions were answered with apparent satisfaction. The patient knows to call the clinic with any problems, questions or concerns.   Wyvonnia Lora MD MS AAHIVMS [facility] [place] [facility] Hematology/Oncology Physician [facility]  .*Total Encounter Time as defined by the Centers for Medicare and Medicaid Services includes, in addition to the face-to-face time of a patient visit (documented in the note above) non-face-to-face time: obtaining and reviewing outside history, ordering and reviewing medications, tests or procedures, care coordination (communications with other health care professionals or caregivers) and documentation in the medical record.

## 2023-08-22 ENCOUNTER — Ambulatory Visit (HOSPITAL_COMMUNITY)
Admission: RE | Admit: 2023-08-22 | Discharge: 2023-08-22 | Disposition: A | Discharge status: Home / Self Care | Payer: 59 | Source: Ambulatory Visit | Attending: Surgery | Admitting: Surgery

## 2023-08-22 ENCOUNTER — Ambulatory Visit (INDEPENDENT_AMBULATORY_CARE_PROVIDER_SITE_OTHER)
Admission: RE | Admit: 2023-08-22 | Discharge: 2023-08-22 | Disposition: A | Discharge status: Home / Self Care | Payer: 59 | Source: Ambulatory Visit | Attending: Surgery | Admitting: Surgery

## 2023-08-22 ENCOUNTER — Ambulatory Visit: Payer: 59 | Admitting: Physician Assistant

## 2023-08-22 VITALS — BP 143/78 | HR 68 | Resp 18 | Ht 61.0 in | Wt 134.0 lb

## 2023-08-22 DIAGNOSIS — I70211 Atherosclerosis of native arteries of extremities with intermittent claudication, right leg: Secondary | ICD-10-CM

## 2023-08-22 DIAGNOSIS — I739 Peripheral vascular disease, unspecified: Secondary | ICD-10-CM

## 2023-08-22 LAB — VAS US ABI WITH/WO TBI
Left ABI: 0.66
Right ABI: 0.92

## 2023-08-22 NOTE — Progress Notes (Signed)
Office Note     CC:  follow up Requesting Provider:  Maury Dus, MD  HPI: [name] [name] is a 77 y.o. (1945/12/29) female who presents for surveillance of PAD.  She has history of right femoral endarterectomy with vein patch angioplasty and femoral to below the knee palpitated bypass with PTFE by Dr. Myra Gianotti on 10/03/2020 due to right leg rest pain.  She also has a known left SFA occlusion.  She denies any debilitating claudication symptoms currently.  She is also without rest pain or tissue loss of bilateral lower extremities.  She continues to take a statin on a daily basis.  She is an everyday smoker.   Past Medical History:  Diagnosis Date   Allergy    environmental   Asthma    Brain tumor (HCC)    Chronic diastolic CHF (congestive heart failure) (HCC)    CKD (chronic kidney disease)    CLL (chronic lymphocytic leukemia) (HCC)    Constipation 11/15/2011   COPD (chronic obstructive pulmonary disease) (HCC)    CVA (cerebral vascular accident) (HCC)    Diabetes (HCC)    GERD (gastroesophageal reflux disease)    Headache    migraines   Hyperlipidemia    Hypertension    PVD (peripheral vascular disease) (HCC)    Stroke (HCC) 1998   in 1998 due to tumor-right side    Past Surgical History:  Procedure Laterality Date   ABDOMINAL AORTOGRAM W/LOWER EXTREMITY Bilateral 08/12/2020   Procedure: ABDOMINAL AORTOGRAM W/BILATERAL LOWER EXTREMITY RUNOFF;  Surgeon: Nada Libman, MD;  Location: [facility];  Service: Cardiovascular;  Laterality: Bilateral;   ABI  02/2012   ABI <0.65 BL 02/2012   BRAIN MENINGIOMA EXCISION     BRAIN TUMOR EXCISION     Bypass grafting of RLE for PAD claudication      CARDIAC CATHETERIZATION  04/09/208   CESAREAN SECTION     1974, 78 ,80   CHOLECYSTECTOMY, LAPAROSCOPIC     COLONOSCOPY  2014   [place], [place]   ENDARTERECTOMY FEMORAL Right 10/03/2020   Procedure: RIGHT FEMORAL ENDARTERECTOMY WITH VEIN PATCH ANGIOPLASTY;  Surgeon: Nada Libman, MD;  Location: [facility] OR;  Service: Vascular;  Laterality: Right;   ESOPHAGEAL DILATION     FEMORAL-POPLITEAL BYPASS GRAFT Right 10/03/2020   Procedure: RIGHT FEMORAL- BELOW KNEE POPLITEAL BYPASS USING GORE PROPATEN GRAFT;  Surgeon: Nada Libman, MD;  Location: [facility];  Service: Vascular;  Laterality: Right;   PATCH ANGIOPLASTY  10/03/2020   Procedure: VEIN PATCH ANGIOPLASTY;  Surgeon: Nada Libman, MD;  Location: [facility];  Service: Vascular;;    Social History   Socioeconomic History   Marital status: Single    Spouse name: Not on file   Number of children: 3   Years of education: Not on file   Highest education level: Not on file  Occupational History   Occupation: DISABLED    Employer: DISABLED  Tobacco Use   Smoking status: Every Day    Current packs/day: 0.25    Average packs/day: 0.2 packs/day for 47.6 years (11.9 ttl pk-yrs)    Types: Cigarettes    Start date: 01/01/1976    Passive exposure: Current   Smokeless tobacco: Never   Tobacco comments:    ~ 3 cigarettes / day  Vaping Use   Vaping status: Never Used  Substance and Sexual Activity   Alcohol use: Not Currently   Drug use: Not Currently   Sexual activity: Yes  Other Topics  Concern   Not on file  Social History Narrative   ** Merged History Encounter **       Health Care POA:  Emergency Contact: Lawrie Penfold [phone] (c) End of Life Plan:  Who lives with you: Lives with husband Any pets: none Diet: Patient lacks financial resources for much food. Pt reports eating what is available. Exercise: Patient    does not have an exercise plan. Seatbelts: Patient reports wearing seatbelt when in vehicle.  [name] Exposure/Protection: Hobbies: Bowling, computer games, Bingo  Has financial difficulties and transportation issues as she and her husband share transpo   rtation      Social Determinants of Health   Financial Resource Strain: Medium Risk (05/16/2023)   Overall Financial Resource Strain  (CARDIA)    Difficulty of Paying Living Expenses: Somewhat hard  Food Insecurity: No Food Insecurity (05/16/2023)   Hunger Vital Sign    Worried About Running Out of Food in the Last Year: Never true    Ran Out of Food in the Last Year: Never true  Transportation Needs: No Transportation Needs (05/16/2023)   PRAPARE - Administrator, Civil Service (Medical): No    Lack of Transportation (Non-Medical): No  Physical Activity: Insufficiently Active (05/16/2023)   Exercise Vital Sign    Days of Exercise per Week: 3 days    Minutes of Exercise per Session: 30 min  Stress: No Stress Concern Present (05/16/2023)   [employer] of Occupational Health - Occupational Stress Questionnaire    Feeling of Stress : Not at all  Social Connections: Moderately Isolated (05/16/2023)   Social Connection and Isolation Panel [NHANES]    Frequency of Communication with Friends and Family: More than three times a week    Frequency of Social Gatherings with Friends and Family: Three times a week    Attends Religious Services: 1 to 4 times per year    Active Member of Clubs or Organizations: No    Attends Banker Meetings: Never    Marital Status: Never married  Intimate Partner Violence: Not At Risk (05/16/2023)   Humiliation, Afraid, Rape, and Kick questionnaire    Fear of Current or Ex-Partner: No    Emotionally Abused: No    Physically Abused: No    Sexually Abused: No    Family History  Problem Relation Age of Onset   Heart disease Mother    Asthma Mother    Cancer Mother        uterine    Depression Mother    Heart attack Mother 54   Hyperlipidemia Mother    Hypertension Mother    Stroke Mother    Kidney disease Mother    Heart attack Sister    Stroke Sister    Depression Sister    Diabetes Sister    Hyperlipidemia Sister    Depression Sister    Diabetes Sister    Hyperlipidemia Sister    HIV/AIDS Sister    Diabetes Brother    Asthma Brother    Hypertension  Brother    Cancer Maternal Aunt        lung   Heart disease Maternal Grandmother    Heart attack Maternal Grandmother    Cancer Brother        colon   HIV/AIDS Brother    Colon cancer Neg Hx     Current Outpatient Medications  Medication Sig Dispense Refill   acetaminophen (TYLENOL) 500 MG tablet Take 500-1,000 mg by mouth every 6 (six)  hours as needed for moderate pain.     albuterol (PROVENTIL) (2.5 MG/3ML) 0.083% nebulizer solution Take 3 mLs (2.5 mg total) by nebulization every 6 (six) hours as needed for wheezing or shortness of breath. 75 mL 11   albuterol (VENTOLIN HFA) 108 (90 Base) MCG/ACT inhaler Inhale 2 puffs into the lungs every 4 (four) hours as needed for wheezing or shortness of breath. 8.5 each 11   Alcohol Swabs (ALCOHOL PADS) 70 % PADS      amLODipine (NORVASC) 10 MG tablet Take 1 tablet (10 mg total) by mouth daily. 90 tablet 3   atorvastatin (LIPITOR) 80 MG tablet Take 1 tablet (80 mg total) by mouth daily. 90 tablet 1   Blood Glucose Calibration (OT ULTRA/FASTTK CNTRL SOLN) SOLN See admin instructions.     blood glucose meter kit and supplies KIT Dispense based on patient and insurance preference. Use up to four times daily as directed. 1 each 0   Blood Glucose Monitoring Suppl (ACCU-CHEK GUIDE ME) w/Device KIT Use to test blood sugar twice per day. E11.9 1 kit 1   BREO ELLIPTA 100-25 MCG/ACT AEPB Inhale 1 puff by mouth every day 60 each 11   clopidogrel (PLAVIX) 75 MG tablet Take 1 tablet by mouth every day 30 tablet 11   DULoxetine (CYMBALTA) 60 MG capsule Take 1 capsule by mouth every day 30 capsule 11   empagliflozin (JARDIANCE) 25 MG TABS tablet Take 1 tablet (25 mg total) by mouth daily. 30 tablet 4   famotidine (PEPCID) 20 MG tablet Take 1 tablet by mouth twice daily 60 tablet 11   furosemide (LASIX) 20 MG tablet Take 1 tablet (20 mg total) by mouth daily. 30 tablet 11   gabapentin (NEURONTIN) 300 MG capsule Take 2 capsules by mouth twice daily. Need MD appt  120 capsule 11   glucose blood (ONETOUCH VERIO) test strip Use to check blood sugar three times daily. 100 each 12   iron polysaccharides (NIFEREX) 150 MG capsule Take 1 capsule (150 mg total) by mouth daily. 30 capsule 5   Lancet Devices (EASY MINI EJECT LANCING DEVICE) MISC daily.     Lancets (ONETOUCH DELICA PLUS LANCET33G) MISC USE   TO CHECK GLUCOSE ONCE DAILY 100 each 0   losartan (COZAAR) 50 MG tablet Take 1 tablet (50 mg total) by mouth at bedtime. 90 tablet 3   metFORMIN (GLUCOPHAGE-XR) 500 MG 24 hr tablet Take 2 tablets (1,000 mg total) by mouth daily with breakfast. 180 tablet 1   methocarbamol (ROBAXIN) 500 MG tablet Take 1 tablet (500 mg total) by mouth every 8 (eight) hours as needed for muscle spasms. 90 tablet 5   montelukast (SINGULAIR) 10 MG tablet Take 1 tablet (10 mg total) by mouth at bedtime. 30 tablet 11   nitroGLYCERIN (NITROSTAT) 0.4 MG SL tablet Dissolve 1 tablet under tongue every 5 minutes, up to 3 doses for chest pain 25 tablet 11   traMADol (ULTRAM) 50 MG tablet Take 1 tablet (50 mg total) by mouth every 12 (twelve) hours as needed. 60 tablet 0   TRULICITY 0.75 MG/0.5ML SOPN Inject 0.75 mg subcutaneously once a week 2 mL 11   varenicline (CHANTIX CONTINUING MONTH PAK) 1 MG tablet Take 1 tablet (1 mg total) by mouth 2 (two) times daily. 60 tablet 1   venetoclax (VENCLEXTA) 100 MG tablet Take 2 tablets (200 mg total) by mouth daily. Tablets should be swallowed whole with a meal and a full glass of water. 60 tablet 2  No current facility-administered medications for this visit.    Allergies  Allergen Reactions   Gazyva [Obinutuzumab] Shortness Of Breath    Acute respiratory distress   Aspirin     Abdominal pain.    No [facility] Food Allergies Diarrhea and Nausea And Vomiting    Cabbage, stomach pain    Aspirin Other (See Comments)    irritates stomach   Baclofen Other (See Comments)    Stomach irritation     REVIEW OF SYSTEMS:   [X]  denotes positive  finding, [ ]  denotes negative finding Cardiac  Comments:  Chest pain or chest pressure:    Shortness of breath upon exertion:    Short of breath when lying flat:    Irregular heart rhythm:        Vascular    Pain in calf, thigh, or hip brought on by ambulation:    Pain in feet at night that wakes you up from your sleep:     Blood clot in your veins:    Leg swelling:         Pulmonary    Oxygen at home:    Productive cough:     Wheezing:         Neurologic    Sudden weakness in arms or legs:     Sudden numbness in arms or legs:     Sudden onset of difficulty speaking or slurred speech:    Temporary loss of vision in one eye:     Problems with dizziness:         Gastrointestinal    Blood in stool:     Vomited blood:         Genitourinary    Burning when urinating:     Blood in urine:        Psychiatric    Major depression:         Hematologic    Bleeding problems:    Problems with blood clotting too easily:        Skin    Rashes or ulcers:        Constitutional    Fever or chills:      PHYSICAL EXAMINATION:  Vitals:   08/22/23 1330  BP: (!) 143/78  Pulse: 68  Resp: 18  SpO2: 94%  Weight: 134 lb (60.8 kg)  Height: 5\' 1"  (1.549 m)    General:  WDWN in NAD; vital signs documented above Gait: Not observed HENT: WNL, normocephalic Pulmonary: normal non-labored breathing , without Rales, rhonchi,  wheezing Cardiac: regular HR Abdomen: soft, NT, no masses Skin: without rashes Vascular Exam/Pulses: palpable R DP pulse; absent L pedal pulses Extremities: without ischemic changes, without Gangrene , without cellulitis; without open wounds;  Musculoskeletal: no muscle wasting or atrophy  Neurologic: A&O X 3 Psychiatric:  The pt has Normal affect.   Non-Invasive Vascular Imaging:    Right femoropopliteal widely patent by duplex  ABI/TBIToday's ABIToday's TBIPrevious ABIPrevious TBI  +-------+-----------+-----------+------------+------------+   Right 0.92       0.86       0.91        0.67          +-------+-----------+-----------+------------+------------+  Left  0.66       0.64       0.72        0.59          +-------+-----------+-----------+------------+------------+    ASSESSMENT/PLAN:: 77 y.o. female here for follow up for surveillance of PAD   -Right leg well-perfused with  palpable DP pulse.  Right leg duplex demonstrates a widely patent bypass graft without elevated velocities.  Her ABI/TBI's are unchanged from prior visit.  She has a known left SFA occlusion however it is currently asymptomatic.  No indication for revascularization of left SFA at this time.  I encouraged her to continue to walk is much as possible.  She will continue her statin on a daily basis.  I also encouraged smoking cessation.  We will repeat right leg bypass duplex and ABIs in 1 year.  She knows to call/return office sooner with any questions or concerns.   Emilie Rutter, PA-C [facility] [phone]  Clinic MD:   Myra Gianotti

## 2023-08-23 ENCOUNTER — Ambulatory Visit (INDEPENDENT_AMBULATORY_CARE_PROVIDER_SITE_OTHER): Payer: 59 | Admitting: Family Medicine

## 2023-08-23 ENCOUNTER — Telehealth: Payer: Self-pay | Admitting: Family Medicine

## 2023-08-23 ENCOUNTER — Encounter: Payer: Self-pay | Admitting: Family Medicine

## 2023-08-23 VITALS — BP 120/70 | HR 58 | Ht 61.0 in | Wt 136.0 lb

## 2023-08-23 DIAGNOSIS — R0789 Other chest pain: Secondary | ICD-10-CM

## 2023-08-23 DIAGNOSIS — K219 Gastro-esophageal reflux disease without esophagitis: Secondary | ICD-10-CM | POA: Diagnosis not present

## 2023-08-23 MED ORDER — NITROGLYCERIN 0.4 MG SL SUBL
SUBLINGUAL_TABLET | SUBLINGUAL | 11 refills | Status: DC
Start: 2023-08-23 — End: 2023-09-22

## 2023-08-23 NOTE — Progress Notes (Signed)
    SUBJECTIVE:   CHIEF COMPLAINT / HPI:   Chest pain Patient here today to follow up on chest pain discussed during last visit. Patient feels her chest pain episodes have remained at their baseline, with episodes consisting of 5-10 minutes of tight, non-radiating 2-3/10 sternal pain, occurring 1-2 times a week. No current chest pain today. Last episode of chest pain occurred two days prior to today's visit. She has not experienced new vision changes or new increased SOB. No recent vision changes. Patient last saw cardiology in Jan 2024.  Abdominal pain Patient describes chronic reflux-like abdominal pain. At times she feels like she is not able to keep food down, and has vomited maybe once per week. Emesis without blood or coffee ground appearance. No bloody or dark stools. She does feel like her Pepcid helps some. No recent fevers or chills. Patient last saw GI in 2019.   PERTINENT  PMH / PSH: HFpEF, COPD, T2DM, CKD3, HTN, PAD, CLL  OBJECTIVE:   BP 120/70   Pulse (!) 58   Ht 5\' 1"  (1.549 m)   Wt 136 lb (61.7 kg)   SpO2 96%   BMI 25.70 kg/m   General: Well-appearing. Resting comfortably in room. CV: 2/6 blowing systolic murmur. No extra heart sounds. Warm and well-perfused. Pulm: Breathing comfortably on room air. CTAB. No increased WOB. Abd: Soft, non-distended. Tender to palpation of LUQ. No rebound or guarding.  Skin:  Warm, dry. Psych: Pleasant and appropriate.    ASSESSMENT/PLAN:   Chest pain Given chronic nature of her chest pain, more likely anginal pain at this time and not acute infarction. Patient's Lexiscan in 2021 did not show signs of ischemia. Chest pain may also be secondary to reflux symptoms. - Messaged patient's cardiology team - they are to reach out to patient about further testing - Refilled patient's nitroglycerin - Discussed red flag symptoms/ER precautions  Abdominal pain Given postprandial nature of abdominal pain, likely secondary to reflux symptoms.  Less likely UGI bleed without hematemesis. Not likely acute abdomen given chronic nature and overall reassuring exam.  - Referral to GI placed for further assessment as patient has not been seen by them since 2019 - H Pylori breath test today, consider abx treatment as needed - Cont Pepcid - Recommend over the counter TUMS  Ivery Quale, MD [facility]

## 2023-08-23 NOTE — Patient Instructions (Addendum)
Thank you for visiting clinic today - it is always our pleasure to care for you.  Today we discussed your chest pain and reflux symptoms.   For the chest pain, it is reassuring that it feels overall around your baseline. I will reach out to your cardiologist, Dr Mayford Knife, to discuss this and I will let you know what I hear. If you experience any of the following symptoms, please seek emergent care: - sharp chest pain - chest pain that spreads down your arm - chest pain that lasts longer than 20 mins - chest pain that does not improve with nitroglycerin   For your reflux, we are completing a breath test today to better assess your symptoms. I will let you know about the results. I have placed a referral to the GI team and they should be calling you to schedule further. In the meantime, please continue to take your Pepcid and try over the counter TUMS as needed.  Otherwise, please reach out to your pharmacy to discuss the tramadol formulations.   Please schedule an appointment in 2-3 weeks to follow up on how you are doing.   Reach out any time with any questions or concerns you may have - we are here for you!  Ivery Quale, MD

## 2023-08-23 NOTE — Telephone Encounter (Signed)
Left voicemail for patient informing her that her cardiology team is planning to reach out to her regarding further cardiac workup.

## 2023-08-25 ENCOUNTER — Telehealth: Payer: Self-pay

## 2023-08-25 DIAGNOSIS — R079 Chest pain, unspecified: Secondary | ICD-10-CM

## 2023-08-25 LAB — H. PYLORI BREATH TEST: H pylori Breath Test: NEGATIVE

## 2023-08-25 NOTE — Addendum Note (Signed)
Addended by: Luellen Pucker on: 08/25/2023 03:51 PM   Modules accepted: Orders

## 2023-08-25 NOTE — Telephone Encounter (Signed)
Called patient to discuss Dr. Norris Cross recommendation for Stress/PET/CT. Patient verbalizes understanding and agrees to plan. Order placed. Instructions reviewed with patient over the phone and also sent through Mychart.

## 2023-08-25 NOTE — Telephone Encounter (Signed)
-----   Message from Armanda Magic sent at 08/23/2023 12:35 PM EDT ----- Regarding: RE: Patient follow up I will get my nurse to order a stress PET CT which looks a blood flow reserve and will help determine if she may have small vessel disease.  I will have my nurse set up followup with [place] after the study. ----- Message ----- From: [name] Quale, MD Sent: 08/23/2023  12:32 PM EDT To: [name] Reichert, MD Subject: Patient follow up                              Hi [name] [name],  Good afternoon - I hope everything is well with you. My name is [name] [name] and I'm one of the family medicine residents working with your patient, [name] [name]. If I may, I would love to gather your insight and recommendations. [name] Viselli has been seen a couple of times in clinic recently and endorses her chronic chest pain symptoms. I saw that her Lexiscan in 2021 was reassuring. Would you recommend any repeat testing or other testing now? How soon would you like for her to see you again in your clinic? Thank you very much in advance - I really appreciate your help!  [place], [name] Quale, MD

## 2023-08-26 ENCOUNTER — Telehealth: Payer: Self-pay | Admitting: Pharmacist

## 2023-08-26 NOTE — Telephone Encounter (Signed)
Reviewed and agree with Dr Koval's plan.   

## 2023-08-26 NOTE — Telephone Encounter (Signed)
Attempted to contact patient for follow-up of visit where she joined her husband today.  She requested to be changed from her current "spiriva" inhaler to the inhaler "spiriva respimat" that was being reviewed with her husband during his visit.   Upon review of her chart the current inhaler in use by patient is BREO and this is not a simple exchange to Spiriva Respimat.    Contacted patient and left HIPAA compliant voice mail requesting call back to direct phone: [phone] - briefly explaining any exchange in medicine would need to occur at next PCP visit.  Dr. Threasa Beards aware of conversation and opportunity for medication adjustment at future visit.   Total time with patient call and documentation of interaction: 13 minutes.  Follow-up phone call planned: None

## 2023-09-08 ENCOUNTER — Other Ambulatory Visit: Payer: Self-pay

## 2023-09-08 DIAGNOSIS — M545 Low back pain, unspecified: Secondary | ICD-10-CM

## 2023-09-09 ENCOUNTER — Other Ambulatory Visit: Payer: Self-pay

## 2023-09-09 DIAGNOSIS — I739 Peripheral vascular disease, unspecified: Secondary | ICD-10-CM

## 2023-09-09 MED ORDER — TRAMADOL HCL 50 MG PO TABS
50.0000 mg | ORAL_TABLET | Freq: Two times a day (BID) | ORAL | 0 refills | Status: DC | PRN
Start: 2023-09-09 — End: 2023-10-03

## 2023-09-09 NOTE — Telephone Encounter (Signed)
PDMP reviewed.

## 2023-09-20 ENCOUNTER — Telehealth (HOSPITAL_COMMUNITY): Payer: Self-pay | Admitting: *Deleted

## 2023-09-20 NOTE — Telephone Encounter (Signed)
Attempted to call patient regarding upcoming cardiac PET appointment. Left message on voicemail with name and callback number Johney Frame RN Navigator Cardiac Imaging [facility] [phone] Office

## 2023-09-20 NOTE — Telephone Encounter (Signed)
  Shared Decision Making/Informed Consent The risks [chest pain, shortness of breath, cardiac arrhythmias, dizziness, blood pressure fluctuations, myocardial infarction, stroke/transient ischemic attack, nausea, vomiting, allergic reaction, radiation exposure, metallic taste sensation and life-threatening complications (estimated to be 1 in 10,000)], benefits (risk stratification, diagnosing coronary artery disease, treatment guidance) and alternatives of a cardiac PET stress test were discussed in detail with Ms. Pourciau and she agrees to proceed. l

## 2023-09-20 NOTE — Addendum Note (Signed)
Addended by: Quintella Reichert on: 09/20/2023 04:44 PM   Modules accepted: Orders

## 2023-09-21 ENCOUNTER — Encounter (HOSPITAL_COMMUNITY): Payer: Self-pay

## 2023-09-21 ENCOUNTER — Encounter (HOSPITAL_COMMUNITY)
Admission: RE | Admit: 2023-09-21 | Discharge: 2023-09-21 | Disposition: A | Discharge status: Home / Self Care | Payer: 59 | Source: Ambulatory Visit | Attending: Cardiology | Admitting: Cardiology

## 2023-09-21 DIAGNOSIS — R079 Chest pain, unspecified: Secondary | ICD-10-CM | POA: Insufficient documentation

## 2023-09-22 ENCOUNTER — Ambulatory Visit: Payer: 59 | Admitting: Family Medicine

## 2023-09-22 ENCOUNTER — Encounter: Payer: Self-pay | Admitting: Family Medicine

## 2023-09-22 ENCOUNTER — Other Ambulatory Visit: Payer: Self-pay

## 2023-09-22 VITALS — BP 141/75 | HR 71 | Ht 61.0 in | Wt 137.8 lb

## 2023-09-22 DIAGNOSIS — Z23 Encounter for immunization: Secondary | ICD-10-CM | POA: Diagnosis not present

## 2023-09-22 DIAGNOSIS — R0789 Other chest pain: Secondary | ICD-10-CM

## 2023-09-22 DIAGNOSIS — I5032 Chronic diastolic (congestive) heart failure: Secondary | ICD-10-CM | POA: Diagnosis not present

## 2023-09-22 MED ORDER — FUROSEMIDE 20 MG PO TABS
20.0000 mg | ORAL_TABLET | Freq: Every day | ORAL | 3 refills | Status: DC
Start: 2023-09-22 — End: 2024-07-24

## 2023-09-22 MED ORDER — CLOPIDOGREL BISULFATE 75 MG PO TABS: 75.0000 mg | ORAL_TABLET | Freq: Every day | ORAL | 3 refills | Status: DC

## 2023-09-22 MED ORDER — DULOXETINE HCL 60 MG PO CPEP: 60.0000 mg | ORAL_CAPSULE | Freq: Every day | ORAL | 3 refills | Status: DC

## 2023-09-22 MED ORDER — POLYSACCHARIDE IRON COMPLEX 150 MG PO CAPS: 150.0000 mg | ORAL_CAPSULE | Freq: Every day | ORAL | 5 refills | Status: DC

## 2023-09-22 MED ORDER — NITROGLYCERIN 0.4 MG SL SUBL: SUBLINGUAL_TABLET | SUBLINGUAL | 3 refills | Status: DC

## 2023-09-22 NOTE — Patient Instructions (Addendum)
Thank you for visiting clinic today - it is always our pleasure to care for you.  Today we discussed how you've been feeling overall and went through your medications. We're glad to hear that you've been feeling at your baseline lately. We refilled the medications you're out of with Divvy Dose. You also received your annual flu and covid shot today.   We scheduled an appointment for you here with our Pharmacist, Dr Raymondo Band, to further discuss possible changes to your [facility] and Trulicity. Please bring all of your medications with you at that time.   Your pharmacy appointment is on   Reach out any time with any questions or concerns you may have - we are here for you!  Ivery Quale, MD [facility] [phone]

## 2023-09-22 NOTE — Progress Notes (Signed)
SUBJECTIVE:   CHIEF COMPLAINT / HPI:   Patient returning to clinic today to follow-up on chronic issues, refill medications, and to receive flu and COVID vaccines.  Medication concerns  Upon medication reconciliation, patient believes she may be out of her nitroglycerin, Plavix, Lasix, duloxetine, and polysaccharide iron.  She was strongly prefers her medications to be sent to [facility].  She also notes that she really does not like her Breo Ellipta because of its powder.  She is interested in a change.  She also does not enjoy Trulicity injections, and is interested in changing to something that does not involve injections.  Chest discomfort  Patient feels that her chronic, intermittent chest discomfort has been unchanged recently.  She reports 8/10 chest pain that occurs once or twice a week, with episodes lasting a few minutes at a time.  She finds that stress from family can trigger these episodes.  No current chest pain today.  She was scheduled for a cardiac PET scan yesterday but was unable to obtain it as she had consumed some fluids prior to the scheduled exam.  Reflux symptoms She feels like her reflux symptoms are stable.  She reports 1-2 episodes of reflux per week.  She is taking Pepcid.  No nausea, vomiting, diarrhea, or constipation. She does note occasional episodic belly pain.  No melena or bloody stools noted.    Health maintenance Patient would like to receive her annual flu and COVID shot today.  No recent fever or chills, or other cold/flulike symptoms.  R ear pain As patient is exiting the room with her AVS, she remembers that she would also like to discuss her ear pain.  Over the past few weeks she has had episodic deep right ear pain. No changes in hearing. No drainage.   PERTINENT  PMH / PSH:  HFpEF, COPD, T2DM, CKD3, HTN, PAD, CLL   OBJECTIVE:   BP (!) 141/75   Pulse 71   Ht 5\' 1"  (1.549 m)   Wt 137 lb 12.8 oz (62.5 kg)   SpO2 93%   BMI 26.04 kg/m    General: Well-appearing. Resting comfortably in room. CV:  Soft diastolic murmur. No extra heart sounds. Warm and well-perfused. Pulm: Breathing comfortably on room air. CTAB. No increased WOB. Abd: Soft, non-distended.  Some mild tenderness to right lower quadrant.  No rebound or guarding. MSK: Tenderness of lower back and iliac regions consistent with patient's chronic pain symptoms.  Psych: Pleasant and appropriate.   Limited R ear exam: No tenderness, swelling, erythema of external ear.  No tenderness, swelling, erythema of postauricular region.  Ear canal without erythema.  Unable to fully visualize eardrum given earwax.  ASSESSMENT/PLAN:   Medication concerns - Reordered the medications that the patient needed to her preferred pharmacy - Scheduled a pharmacy appointment for patient to further discuss Breo Ellipta alternatives and Trulicity alternatives.  Appointment scheduled for October 2 at 10:30 AM. Advised patient to bring all her medications with her at this time.  Chest pain Patient's chest pain symptoms appear stable at this time. - Patient has been rescheduled for cardiac PET scan on October 8 - Follow-up PET scan and cardiology recommendations - Nitroglycerin reordered  Reflux symptoms Patient's reflux symptoms appear stable at this time. -Continue Pepcid  Health maintenance Patient received flu and COVID-vaccine today.  R ear pain  Advised patient to return to clinic for earwax removal and more detailed exam for this issue in particular.  Scheduled clinic appointment for  tomorrow morning at 8:50AM.  Discussed red flag signs for more urgent care including hearing loss, drainage, unretractable pain, or fever.   Ivery Quale, MD [facility]

## 2023-09-23 ENCOUNTER — Ambulatory Visit (INDEPENDENT_AMBULATORY_CARE_PROVIDER_SITE_OTHER): Payer: 59 | Admitting: Family Medicine

## 2023-09-23 DIAGNOSIS — H6120 Impacted cerumen, unspecified ear: Secondary | ICD-10-CM

## 2023-09-23 NOTE — Patient Instructions (Addendum)
It was wonderful to see you today.  Please bring ALL of your medications with you to every visit.   Today we talked about:  Please call Gastroenterology to schedule   Phone: [phone]     Please follow up in 1 months   Thank you for choosing [facility].   Please call [phone] with any questions about today's appointment.  Please be sure to schedule follow up at the front  desk before you leave today.   Terisa Starr, MD  Family Medicine

## 2023-09-23 NOTE — Progress Notes (Unsigned)
No show

## 2023-09-28 ENCOUNTER — Ambulatory Visit: Payer: 59 | Admitting: Pharmacist

## 2023-09-30 ENCOUNTER — Telehealth (HOSPITAL_COMMUNITY): Payer: Self-pay | Admitting: Emergency Medicine

## 2023-09-30 ENCOUNTER — Encounter (HOSPITAL_COMMUNITY): Payer: Self-pay

## 2023-09-30 NOTE — Telephone Encounter (Signed)
Attempted to call patient regarding upcoming cardiac PET appointment. Left message on voicemail with name and callback number Aidan Moten RN Navigator Cardiac Imaging [facility] [phone] Office [phone] Cell  

## 2023-10-03 ENCOUNTER — Telehealth: Payer: Self-pay | Admitting: Cardiology

## 2023-10-03 ENCOUNTER — Other Ambulatory Visit: Payer: Self-pay

## 2023-10-03 DIAGNOSIS — G8929 Other chronic pain: Secondary | ICD-10-CM

## 2023-10-03 MED ORDER — TRAMADOL HCL 50 MG PO TABS: 50.0000 mg | ORAL_TABLET | Freq: Two times a day (BID) | ORAL | 0 refills | Status: DC | PRN

## 2023-10-03 NOTE — Telephone Encounter (Signed)
PDMP reviewed. Placed future refill order for 1 month following prior fill date.

## 2023-10-03 NOTE — Telephone Encounter (Signed)
Patient is calling to speak with Dr, Mayford Knife or a nurse in regards to her appointment tomorrow. Please advise.

## 2023-10-03 NOTE — Telephone Encounter (Signed)
Spoke with patient and she was calling to verify time of CT tomorrow.  Patient is aware of time and procedure instructions. Instructions were also sent visa mychart 10/4

## 2023-10-04 ENCOUNTER — Encounter (HOSPITAL_COMMUNITY)
Admission: RE | Admit: 2023-10-04 | Discharge: 2023-10-04 | Disposition: A | Discharge status: Home / Self Care | Payer: 59 | Source: Ambulatory Visit | Attending: Cardiology | Admitting: Cardiology

## 2023-10-04 DIAGNOSIS — R079 Chest pain, unspecified: Secondary | ICD-10-CM | POA: Diagnosis present

## 2023-10-04 LAB — NM PET CT CARDIAC PERFUSION MULTI W/ABSOLUTE BLOODFLOW
LV dias vol: 62 mL (ref 46–106)
MBFR: 2.25
Nuc Rest EF: 69 %
Nuc Stress EF: 75 %
Peak HR: 80 {beats}/min
Rest HR: 62 {beats}/min
Rest MBF: 1.18 ml/g/min
Rest Nuclear Isotope Dose: 16.3 mCi
Rest perfusion cavity size (mL): 62 mL
ST Depression (mm): 0 mm
Stress MBF: 2.66 ml/g/min
Stress Nuclear Isotope Dose: 16.4 mCi
Stress perfusion cavity size (mL): 76 mL
TID: 1.09

## 2023-10-04 MED ORDER — RUBIDIUM RB82 GENERATOR (RUBYFILL)
16.3700 | PACK | Freq: Once | INTRAVENOUS | Status: AC
  Administered 2023-10-04: 16.37 via INTRAVENOUS

## 2023-10-04 MED ORDER — REGADENOSON 0.4 MG/5ML IV SOLN
INTRAVENOUS | Status: AC
  Filled 2023-10-04: qty 5

## 2023-10-04 MED ORDER — REGADENOSON 0.4 MG/5ML IV SOLN
0.4000 mg | Freq: Once | INTRAVENOUS | Status: AC
  Administered 2023-10-04: 0.4 mg via INTRAVENOUS

## 2023-10-04 MED ORDER — RUBIDIUM RB82 GENERATOR (RUBYFILL)
16.2600 | PACK | Freq: Once | INTRAVENOUS | Status: AC
  Administered 2023-10-04: 16.26 via INTRAVENOUS

## 2023-10-04 NOTE — Progress Notes (Signed)
Tolerated test well 

## 2023-10-05 ENCOUNTER — Encounter: Payer: Self-pay | Admitting: Family Medicine

## 2023-10-05 ENCOUNTER — Telehealth: Payer: Self-pay

## 2023-10-05 NOTE — Telephone Encounter (Signed)
-----   Message from Armanda Magic sent at 10/05/2023  9:27 AM EDT ----- Please let patient know that stress test was fine

## 2023-10-05 NOTE — Telephone Encounter (Signed)
Call to patient to discuss stress test results, which are fine per Dr. Mayford Knife. Aortic atherosclerosis present. Also discussed that non-cardiac portion showed emphysema which patient says she already knew about.

## 2023-10-12 ENCOUNTER — Encounter: Payer: Self-pay | Admitting: Physical Medicine and Rehabilitation

## 2023-10-12 ENCOUNTER — Encounter: Payer: 59 | Attending: Physical Medicine and Rehabilitation | Admitting: Physical Medicine and Rehabilitation

## 2023-10-12 VITALS — BP 132/68 | HR 81 | Ht 61.0 in | Wt 137.0 lb

## 2023-10-12 DIAGNOSIS — N1832 Chronic kidney disease, stage 3b: Secondary | ICD-10-CM | POA: Diagnosis not present

## 2023-10-12 DIAGNOSIS — G8929 Other chronic pain: Secondary | ICD-10-CM | POA: Diagnosis present

## 2023-10-12 DIAGNOSIS — G894 Chronic pain syndrome: Secondary | ICD-10-CM | POA: Diagnosis present

## 2023-10-12 DIAGNOSIS — M545 Low back pain, unspecified: Secondary | ICD-10-CM | POA: Insufficient documentation

## 2023-10-12 DIAGNOSIS — M62838 Other muscle spasm: Secondary | ICD-10-CM | POA: Insufficient documentation

## 2023-10-12 MED ORDER — METHOCARBAMOL 500 MG PO TABS: 500.0000 mg | ORAL_TABLET | Freq: Three times a day (TID) | ORAL | 1 refills | Status: DC | PRN

## 2023-10-12 MED ORDER — GABAPENTIN 300 MG PO CAPS: 600.0000 mg | ORAL_CAPSULE | Freq: Every day | ORAL | 1 refills | Status: DC

## 2023-10-12 NOTE — Patient Instructions (Signed)
Patient is a 77 yr old female with hx of dCHF, DM 2- A1c 7.3 , COPD?, CLL with prior  WBC >100k- now 7.5, and chronic back and R>>>L foot pain. Has chronic low back and RLE>LLE DM neuropathy. Also has CKD3- Cr 1.39-  Still a smoker- 0.5-1 ppd.  Here for f/u on Nerve pain  Con't Tramadol- per PCP  2.  Offered trigger point injections to help pain of neck-  doesn't want trigger point injections since scared of needles.     3. Con't Gabapentin but changed to 600 mg nightly for nerve pain- reduced due to Kidney function.    4. Con't Duloxetine- gets from PCP.    5. Con't Methocarbamol- the muscle relaxant for muscle spasms- 3x/day as needed # 90- sent in 6 months supply   6. Will see Dr Danielle Dess for neck issues- will see her when she addresses financial side of things.    7. F/U in 6 months- call me if wants injections before that.

## 2023-10-12 NOTE — Progress Notes (Signed)
Subjective:    Patient ID: [name] [name], female    DOB: 09/14/1946, 77 y.o.   MRN: [id number]  HPI  Patient is a 77 yr old female with hx of dCHF, DM 2- A1c 7.3 , COPD?, CLL with prior  WBC >100k- now 7.5, and chronic back and R>>>L foot pain. Has chronic low back and RLE>LLE DM neuropathy. Also has CKD3- Cr 1.39-  Still a smoker- 0.5 ppd.  Here for f/u on Nerve pain  Things "so-so"  Neck still bothering her.  Needs to get $45 to see Dr Danielle Dess-   Wasn't sure what Dr Danielle Dess was going to do.    Dr Threasa Beards, PCP write for tramadol.    Was taking Gabapentin 2 tabs BID- and then we were going to cut down on gabapentin-    Said I didn't decrease gabapentin like "was supposed to".   [facility] messed up her meds so wants [facility] to fill meds.  Still taking Duloxetine 60 mg daily- got from PCP.  Taking Tramadol 50 mg 2x/day   Takes Robaxin- 3x/day- Robaxin helps when pain hits.  Doesn't make really sleepy.   Still smoking- has increased amount smoking-  0.5-1ppd now usually .75 ppd Nerve wracking daughter at her house.   Sometimes stays in church, so can stay away from daughter.     Pain Inventory Average Pain 0 Pain Right Now 0 My pain is sharp  In the last 24 hours, has pain interfered with the following? General activity 0 Relation with others 0 Enjoyment of life 0 What TIME of day is your pain at its worst? morning  Sleep (in general) Fair  Pain is worse with: walking and standing Pain improves with: rest and medication Relief from Meds: 5  Family History  Problem Relation Age of Onset   Heart disease Mother    Asthma Mother    Cancer Mother        uterine    Depression Mother    Heart attack Mother 3   Hyperlipidemia Mother    Hypertension Mother    Stroke Mother    Kidney disease Mother    Heart attack Sister    Stroke Sister    Depression Sister    Diabetes Sister    Hyperlipidemia Sister    Depression Sister    Diabetes Sister    Hyperlipidemia  Sister    HIV/AIDS Sister    Diabetes Brother    Asthma Brother    Hypertension Brother    Cancer Maternal Aunt        lung   Heart disease Maternal Grandmother    Heart attack Maternal Grandmother    Cancer Brother        colon   HIV/AIDS Brother    Colon cancer Neg Hx    Social History   Socioeconomic History   Marital status: Single    Spouse name: Not on file   Number of children: 3   Years of education: Not on file   Highest education level: Not on file  Occupational History   Occupation: DISABLED    Employer: DISABLED  Tobacco Use   Smoking status: Every Day    Current packs/day: 0.25    Average packs/day: 0.2 packs/day for 47.8 years (11.9 ttl pk-yrs)    Types: Cigarettes    Start date: 01/01/1976    Passive exposure: Current   Smokeless tobacco: Never   Tobacco comments:    ~ 3 cigarettes / day  Vaping Use  Vaping status: Never Used  Substance and Sexual Activity   Alcohol use: Not Currently   Drug use: Not Currently   Sexual activity: Yes  Other Topics Concern   Not on file  Social History Narrative   ** Merged History Encounter **       Health Care POA:  Emergency Contact: Shylan Matczak [phone] (c) End of Life Plan:  Who lives with you: Lives with husband Any pets: none Diet: Patient lacks financial resources for much food. Pt reports eating what is available. Exercise: Patient    does not have an exercise plan. Seatbelts: Patient reports wearing seatbelt when in vehicle.  [name] Exposure/Protection: Hobbies: Bowling, computer games, Bingo  Has financial difficulties and transportation issues as she and her husband share transpo   rtation      Social Determinants of Health   Financial Resource Strain: Medium Risk (05/16/2023)   Overall Financial Resource Strain (CARDIA)    Difficulty of Paying Living Expenses: Somewhat hard  Food Insecurity: No Food Insecurity (05/16/2023)   Hunger Vital Sign    Worried About Running Out of Food in the Last  Year: Never true    Ran Out of Food in the Last Year: Never true  Transportation Needs: No Transportation Needs (05/16/2023)   PRAPARE - Administrator, Civil Service (Medical): No    Lack of Transportation (Non-Medical): No  Physical Activity: Insufficiently Active (05/16/2023)   Exercise Vital Sign    Days of Exercise per Week: 3 days    Minutes of Exercise per Session: 30 min  Stress: No Stress Concern Present (05/16/2023)   [employer] of Occupational Health - Occupational Stress Questionnaire    Feeling of Stress : Not at all  Social Connections: Moderately Isolated (05/16/2023)   Social Connection and Isolation Panel [NHANES]    Frequency of Communication with Friends and Family: More than three times a week    Frequency of Social Gatherings with Friends and Family: Three times a week    Attends Religious Services: 1 to 4 times per year    Active Member of Clubs or Organizations: No    Attends Banker Meetings: Never    Marital Status: Never married   Past Surgical History:  Procedure Laterality Date   ABDOMINAL AORTOGRAM W/LOWER EXTREMITY Bilateral 08/12/2020   Procedure: ABDOMINAL AORTOGRAM W/BILATERAL LOWER EXTREMITY RUNOFF;  Surgeon: Nada Libman, MD;  Location: [facility];  Service: Cardiovascular;  Laterality: Bilateral;   ABI  02/2012   ABI <0.65 BL 02/2012   BRAIN MENINGIOMA EXCISION     BRAIN TUMOR EXCISION     Bypass grafting of RLE for PAD claudication      CARDIAC CATHETERIZATION  04/09/208   CESAREAN SECTION     1974, 78 ,80   CHOLECYSTECTOMY, LAPAROSCOPIC     COLONOSCOPY  2014   [place]   ENDARTERECTOMY FEMORAL Right 10/03/2020   Procedure: RIGHT FEMORAL ENDARTERECTOMY WITH VEIN PATCH ANGIOPLASTY;  Surgeon: Nada Libman, MD;  Location: [facility] OR;  Service: Vascular;  Laterality: Right;   ESOPHAGEAL DILATION     FEMORAL-POPLITEAL BYPASS GRAFT Right 10/03/2020   Procedure: RIGHT FEMORAL- BELOW KNEE POPLITEAL BYPASS  USING GORE PROPATEN GRAFT;  Surgeon: Nada Libman, MD;  Location: [facility] OR;  Service: Vascular;  Laterality: Right;   PATCH ANGIOPLASTY  10/03/2020   Procedure: VEIN PATCH ANGIOPLASTY;  Surgeon: Nada Libman, MD;  Location: [facility];  Service: Vascular;;   Past Surgical History:  Procedure Laterality Date   ABDOMINAL AORTOGRAM W/LOWER EXTREMITY Bilateral 08/12/2020   Procedure: ABDOMINAL AORTOGRAM W/BILATERAL LOWER EXTREMITY RUNOFF;  Surgeon: Nada Libman, MD;  [facility];  Service: Cardiovascular;  Laterality: Bilateral;   ABI  02/2012   ABI <0.65 BL 02/2012   BRAIN MENINGIOMA EXCISION     BRAIN TUMOR EXCISION     Bypass grafting of RLE for PAD claudication      CARDIAC CATHETERIZATION  04/09/208   CESAREAN SECTION     1974, 78 ,80   CHOLECYSTECTOMY, LAPAROSCOPIC     COLONOSCOPY  2014   [place], [place]   ENDARTERECTOMY FEMORAL Right 10/03/2020   Procedure: RIGHT FEMORAL ENDARTERECTOMY WITH VEIN PATCH ANGIOPLASTY;  Surgeon: Nada Libman, MD;  Location: [facility];  Service: Vascular;  Laterality: Right;   ESOPHAGEAL DILATION     FEMORAL-POPLITEAL BYPASS GRAFT Right 10/03/2020   Procedure: RIGHT FEMORAL- BELOW KNEE POPLITEAL BYPASS USING GORE PROPATEN GRAFT;  Surgeon: Nada Libman, MD;  Location: [facility];  Service: Vascular;  Laterality: Right;   PATCH ANGIOPLASTY  10/03/2020   Procedure: VEIN PATCH ANGIOPLASTY;  Surgeon: Nada Libman, MD;  Location: [facility];  Service: Vascular;;   Past Medical History:  Diagnosis Date   Allergy    environmental   Asthma    Brain tumor (HCC)    Chronic diastolic CHF (congestive heart failure) (HCC)    CKD (chronic kidney disease)    CLL (chronic lymphocytic leukemia) (HCC)    Constipation 11/15/2011   COPD (chronic obstructive pulmonary disease) (HCC)    CVA (cerebral vascular accident) (HCC)    Diabetes (HCC)    GERD (gastroesophageal reflux disease)    Headache    migraines   Hyperlipidemia    Hypertension     PVD (peripheral vascular disease) (HCC)    Stroke (HCC) 1998   in 1998 due to tumor-right side   There were no vitals taken for this visit.  Opioid Risk Score:   Fall Risk Score:  `1  Depression screen [facility] 2/9     09/22/2023    4:50 PM 08/23/2023    9:28 AM 07/28/2023    9:24 AM 05/16/2023   11:24 AM 04/13/2023    9:22 AM 01/14/2023   10:02 AM 12/24/2022    2:49 PM  Depression screen PHQ 2/9  Decreased Interest 0 0 0 0 0 0 0  Down, Depressed, Hopeless 0 0 0 0 0 0 0  PHQ - 2 Score 0 0 0 0 0 0 0  Altered sleeping 0 0 0    0  Tired, decreased energy 0 1 1    0  Change in appetite 0 0 0    0  Feeling bad or failure about yourself  0 0 0    0  Trouble concentrating 0 0 0    0  Moving slowly or fidgety/restless 0 0 0    0  Suicidal thoughts 0 0 0    0  PHQ-9 Score 0 1 1    0      Review of Systems  Musculoskeletal:  Positive for back pain and gait problem.       Right hand pain   All other systems reviewed and are negative.     Objective:   Physical Exam  Awake, alert, appropriate, room smells of smoke, accompanied by husband, NAD Walking with cane  Thick COPD cough intermittently. Tight upper traps, levators, splenius capitus and pecs and scalenes.  Very TTP      Assessment & Plan:   Patient is a 77 yr old female with hx of dCHF, DM 2- A1c 7.3 , COPD?, CLL with prior  WBC >100k- now 7.5, and chronic back and R>>>L foot pain. Has chronic low back and RLE>LLE DM neuropathy. Also has CKD3- Cr 1.39-  Still a smoker- 0.5 ppd.  Here for f/u on Nerve pain  Con't Tramadol- per PCP  2.  Offered trigger point injections to help pain of neck-  doesn't want trigger point injections since scared of needles.     3. Con't Gabapentin but changed to 600 mg nightly for nerve pain- reduced due to Kidney function.    4. Con't Duloxetine- gets from PCP.    5. Con't Methocarbamol- the muscle relaxant for muscle spasms- 3x/day as needed # 90- sent in 6 months supply   6. Will  see Dr Danielle Dess for neck issues- will see her when she addresses financial side of things.    7. F/U in 6 months- call me if wants injections before that.  Think about injections and give me a call.   I spent a total of 20   minutes on total care today- >50% coordination of care- due to  education on trigger point injections and  discussion about nerve and myofascial pain-

## 2023-10-13 ENCOUNTER — Other Ambulatory Visit: Payer: Self-pay | Admitting: Family Medicine

## 2023-10-13 DIAGNOSIS — G8929 Other chronic pain: Secondary | ICD-10-CM

## 2023-10-20 ENCOUNTER — Other Ambulatory Visit: Payer: Self-pay

## 2023-10-20 DIAGNOSIS — M545 Low back pain, unspecified: Secondary | ICD-10-CM

## 2023-10-20 MED ORDER — TRAMADOL HCL 50 MG PO TABS: 50.0000 mg | ORAL_TABLET | Freq: Two times a day (BID) | ORAL | 2 refills | Status: DC | PRN

## 2023-10-20 NOTE — Telephone Encounter (Signed)
Received call from [facility] pharmacy requesting refill on Tramadol.   They advised that prescription does not have any remaining refills.   They are requesting rx for November compliance package that will be sent out around 11/15/23.  Will forward request to PCP.   Veronda Prude, RN

## 2023-11-01 ENCOUNTER — Other Ambulatory Visit: Payer: Self-pay

## 2023-11-01 DIAGNOSIS — E1169 Type 2 diabetes mellitus with other specified complication: Secondary | ICD-10-CM

## 2023-11-02 MED ORDER — METFORMIN HCL ER 500 MG PO TB24
1000.0000 mg | ORAL_TABLET | Freq: Every day | ORAL | 1 refills | Status: DC
Start: 2023-11-02 — End: 2024-04-17

## 2023-11-03 NOTE — Telephone Encounter (Signed)
Tried calling patient no answer, ended up speaking with her husband Zendayah Hardgrave) he explained he will inform the patient to call and schedule her own appointment. Penni Bombard CMA

## 2023-11-11 ENCOUNTER — Encounter: Payer: Self-pay | Admitting: Family Medicine

## 2023-11-11 ENCOUNTER — Ambulatory Visit (INDEPENDENT_AMBULATORY_CARE_PROVIDER_SITE_OTHER): Payer: 59 | Admitting: Family Medicine

## 2023-11-11 VITALS — BP 141/78 | HR 66 | Ht 61.0 in | Wt 134.2 lb

## 2023-11-11 DIAGNOSIS — R1084 Generalized abdominal pain: Secondary | ICD-10-CM | POA: Diagnosis not present

## 2023-11-11 DIAGNOSIS — D126 Benign neoplasm of colon, unspecified: Secondary | ICD-10-CM | POA: Diagnosis not present

## 2023-11-11 DIAGNOSIS — E1169 Type 2 diabetes mellitus with other specified complication: Secondary | ICD-10-CM

## 2023-11-11 LAB — POCT GLYCOSYLATED HEMOGLOBIN (HGB A1C): HbA1c, POC (controlled diabetic range): 6.9 % (ref 0.0–7.0)

## 2023-11-11 NOTE — Progress Notes (Unsigned)
    SUBJECTIVE:   CHIEF COMPLAINT / HPI:   Last A1c 7.5 in July , LDL good 10 mos ago  - metformin 1000 daily, jardiance 25 mg daily, trulicity weekly  - A1c 6.9 today   Starting a week ago  Middle and L abdominal pain, comes and goes  Dark stools - black, no blood  Not having bm every day. Sometimes constipated  No strain, no pain when stooling  No nausea or vomiting  Not like reflux  No recent fevers or other sick symptoms  No change in appetite    PERTINENT  PMH / PSH: ***  OBJECTIVE:   There were no vitals taken for this visit.  ***  ASSESSMENT/PLAN:   No problem-specific Assessment & Plan notes found for this encounter.   T2DM A1c today    [name] Quale, MD [facility]

## 2023-11-11 NOTE — Patient Instructions (Signed)
Thank you for visiting clinic today - it is always our pleasure to care for you.  Today we discussed your diabetes and belly pain.  Your A1c today is 6.9, which shows that your diabetes is under good control right now. Please continue your medications as is. You can do it!   You are due for your colonoscopy. I have placed an order for this and someone should be contacting you to schedule this. We are also collecting a urine test today and I will contact you with the results.   Please schedule an appointment in 3 months to follow up on your diabetes, or sooner as you need.   Reach out any time with any questions or concerns you may have - we are here for you!  Ivery Quale, MD [facility] [phone]

## 2023-11-13 LAB — MICROALBUMIN / CREATININE URINE RATIO
Creatinine, Urine: 59.1 mg/dL
Microalb/Creat Ratio: 66 mg/g{creat} — ABNORMAL HIGH (ref 0–29)
Microalbumin, Urine: 39.2 ug/mL

## 2023-11-27 ENCOUNTER — Other Ambulatory Visit: Payer: Self-pay | Admitting: Family Medicine

## 2023-11-27 DIAGNOSIS — J449 Chronic obstructive pulmonary disease, unspecified: Secondary | ICD-10-CM

## 2023-12-01 ENCOUNTER — Ambulatory Visit (HOSPITAL_COMMUNITY)
Admission: RE | Admit: 2023-12-01 | Discharge: 2023-12-01 | Disposition: A | Discharge status: Home / Self Care | Payer: 59 | Source: Ambulatory Visit | Attending: Family Medicine | Admitting: Family Medicine

## 2023-12-01 ENCOUNTER — Ambulatory Visit: Payer: 59 | Admitting: Family Medicine

## 2023-12-01 ENCOUNTER — Encounter: Payer: Self-pay | Admitting: Family Medicine

## 2023-12-01 ENCOUNTER — Telehealth: Payer: Self-pay

## 2023-12-01 VITALS — BP 133/74 | HR 76 | Wt 132.4 lb

## 2023-12-01 DIAGNOSIS — M79605 Pain in left leg: Secondary | ICD-10-CM | POA: Insufficient documentation

## 2023-12-01 MED ORDER — LIDOCAINE 5 % EX PTCH: 1.0000 | MEDICATED_PATCH | CUTANEOUS | 0 refills | Status: DC

## 2023-12-01 NOTE — Patient Instructions (Addendum)
It was great to see you today! Thank you for choosing [facility] for your primary care.  Today we addressed: Left leg pain We will get some Xrays to make sure we are not missing anything.  Use some lidocaine patches for the pain.  You can put those on your leg and leave them on for 12-24 hours.  Keep taking your tramadol, Tylenol, and Robaxin.  You should return to our clinic if this is not getting better in 1 month.  Go to the ED if the pain gets unbearable, you lose feeling in your leg, you are unable to walk at all, or you start getting sudden swelling.  Thank you for coming to see [place] at [facility] and for the opportunity to care for you! Daneille Desilva, MD 12/01/2023, 3:14 PM

## 2023-12-01 NOTE — Telephone Encounter (Signed)
Returned pt's call left on triage line. She is c/o LLE pain x 2 weeks, when she is walking and at rest. She denies swelling. She made an appt with PCP for today and will call us back after that visit. She was told by them to see them first.

## 2023-12-01 NOTE — Progress Notes (Signed)
   SUBJECTIVE:   CHIEF COMPLAINT / HPI:  CHARONDA LEFEVERS is a 77 y.o. female with a pertinent past medical history of GERD, HFpEF, COPD, T2DM, CKD3, HTN, PAD, CLL presenting to the clinic for   L hip, knee, leg pain Called [facility] to discuss pain, asked her to see PCP. Whole left leg up to hip has been hurting for 2 weeks. Acute onset when she woke up one morning.  Constant pain all day, not any worse when wakes. Hurts more when she stands up, but also hurts at rest.  Brings her to tears. Denies any inciting injury, falls. This pain is not like any she has had before.  Had some PAD stenting in R leg previously, ambulates with cane. Denies weakness of lower extremities, incontinence, foot drop, loss of sensation in BLE, saddle anesthesia.  PERTINENT PMH / PSH: Chronic back pain, PAD s/p stenting, CKD stage III, muscle spasms   OBJECTIVE:   BP 133/74   Pulse 76   Wt 132 lb 6.4 oz (60.1 kg)   SpO2 98%   BMI 25.02 kg/m   General: Thin elderly female, deconditioned, resting in chair, shifting positions frequently, moderate discomfort.  Alert and at baseline. Abdominal: No tenderness to deep or light palpation. No rebound or guarding. MSK: Exquisitely TTP of LLE over greater trochanteric prominence, sacroiliac crest, left lateral lumbar region, upper thigh.  Able to ambulate with cane, slow shuffling gait, favoring right leg.  No palpable inguinal edema. Neuro: Equal strength and sensation in BLE.  Patellar reflexes 2+ bilaterally.  No foot drop. Extremities: No peripheral edema bilaterally.  Poorly palpable DP and PT pulses bilaterally.  R and L LE warm to touch and perfusing with cap refill <2 seconds.  Sensation intact in lower extremities bilaterally.  No rashes or lesions.   ASSESSMENT/PLAN:   Assessment & Plan Left leg pain Unclear etiology, differential remains broad.  More likely MSK etiology such as acute bursitis or muscle strain, for which patient is already on a muscle  relaxer and further management as indicated.  Must also consider nerve impingement or lumbar disc herniation, in which event there would be no change in management.  Low concern for cauda equina syndrome given lack of red flag symptoms.  Must not miss avascular necrosis or fracture, which would require urgent management and treatment.  Fracture is less likely given the lack of inciting injury, but patient does have multiple risk factors for fracture including smoking, advanced postmenopausal age, and thin BMI.  Also considered limb ischemia given PAD requiring previous bypass stenting in extremities, however less likely given 2-week duration of symptoms, warm extremities, proximal location of pain versus distal LE, and more tolerable level of pain. -Left hip XR, 2 views to evaluate for AVN, fracture -Continue gabapentin 600 mg QHS, tramadol 50 mg Q12h PRN, methocarbamol 500 mg Q8h PRN -Add lidocaine patches -Return to clinic in 1 month not improving -Strict ED precautions if pain worsens, sensation loss in leg, notable swelling  Wilhelm Ganaway Sharion Dove, MD [facility]

## 2023-12-08 ENCOUNTER — Telehealth: Payer: Self-pay

## 2023-12-08 NOTE — Telephone Encounter (Signed)
Pharmacy Patient Advocate Encounter   Received notification from CoverMyMeds that prior authorization for LIDOCAINE 5% PATCHES is required/requested.    The patient is insured through [facility] .    PA required; PA submitted to above mentioned insurance via CoverMyMeds Key/confirmation #/EOC [id number] Status is pending

## 2023-12-09 ENCOUNTER — Other Ambulatory Visit: Payer: Self-pay | Admitting: Family Medicine

## 2023-12-09 DIAGNOSIS — E7800 Pure hypercholesterolemia, unspecified: Secondary | ICD-10-CM

## 2023-12-09 NOTE — Telephone Encounter (Signed)
Pharmacy Patient Advocate Encounter  Received notification from [facility] that Prior Authorization for LIDOCAINE 5% PATCHES has been DENIED.  Full denial letter will be uploaded to the media tab. See denial reason below.  LIDOCAINE PAD 5% is not FDA approved for your medical condition(s): left leg pain. These condition(s) are not supported by one of the accepted references. Therefore your drug is denied because it is not being used for a "medically accepted indication."  Lidocaine 4% patches available OTC.  PA #/Case ID/Reference #: [id number]

## 2023-12-27 ENCOUNTER — Other Ambulatory Visit: Payer: Self-pay | Admitting: Family Medicine

## 2023-12-27 DIAGNOSIS — G8929 Other chronic pain: Secondary | ICD-10-CM

## 2024-01-03 ENCOUNTER — Inpatient Hospital Stay: Admitting: Hematology

## 2024-01-03 ENCOUNTER — Inpatient Hospital Stay

## 2024-01-19 ENCOUNTER — Encounter: Payer: Self-pay | Admitting: Internal Medicine

## 2024-01-19 ENCOUNTER — Encounter: Payer: Self-pay | Admitting: Family Medicine

## 2024-01-19 ENCOUNTER — Ambulatory Visit: Payer: 59 | Admitting: Family Medicine

## 2024-01-19 VITALS — BP 128/64 | HR 72 | Ht 61.0 in | Wt 137.4 lb

## 2024-01-19 DIAGNOSIS — M7989 Other specified soft tissue disorders: Secondary | ICD-10-CM | POA: Diagnosis not present

## 2024-01-19 DIAGNOSIS — M62838 Other muscle spasm: Secondary | ICD-10-CM | POA: Diagnosis not present

## 2024-01-19 MED ORDER — METHOCARBAMOL 750 MG PO TABS: 750.0000 mg | ORAL_TABLET | Freq: Three times a day (TID) | ORAL | 0 refills | Status: DC | PRN

## 2024-01-19 MED ORDER — METHOCARBAMOL 500 MG PO TABS: 750.0000 mg | ORAL_TABLET | Freq: Three times a day (TID) | ORAL | 0 refills | Status: DC | PRN

## 2024-01-19 NOTE — Patient Instructions (Addendum)
Thank you for visiting clinic today - it is always our pleasure to care for you.  Today we discussed your leg swelling and chronic pain.  - Please plan to see your Vascular doctor soon. The physical exam of your right leg today was reassuring against a clot. - For your pain, we increased your Robaxin dosage to 750 mg 3 times a day as needed for pain. Please know this medication comes with an increased risk of falls. Please be careful and let us know if you fall.   (You can take 1 and a half pills of your Robaxin 500 mg pills up to 3 times a day while you wait for your 750mg  pills to come in.)   Please return to clinic if your pain is next getting better in the next 1-2 weeks.   Reach out any time with any questions or concerns you may have - we are here for you!  Ivery Quale, MD [facility] [phone]

## 2024-01-19 NOTE — Progress Notes (Signed)
    SUBJECTIVE:   CHIEF COMPLAINT / HPI:   Right leg swelling Patient reports chronic intermittent R leg swelling ever since her vascular surgery years ago. Her vascular doctor has informed her to call them if her leg ever becomes swollen. Most recently, she's had R calf swelling for the past 2 days. She reports no pain, does not feel different from usual.  She wears compression socks on both her feet. Ambulates independently with cane.   Chronic muscle spasms Patient with chronic muscle spasms. Home regimen has been tramadol 50 bid prn and robaxin 500 tid prn. She notes continued painful spasms of her hands, legs, arm, and back. Patient reports the tramadol is not helpful, and gives her diarrhea. She reports trying a family member's higher dose of robaxin (750) recently. She found this helpful and would like to increase her own dosage.  Ambulates independently with cane.   PERTINENT  PMH / PSH: PAD s/p right femoral endarterectomy with vein patch angioplasty and femoral to below the knee palpitated bypass with PTFE 10/03/2020   HFpEF, PAD, COPD, T2DM, CKD3, CVA, HTN, CLL   OBJECTIVE:   BP 128/64   Pulse 72   Ht 5\' 1"  (1.549 m)   Wt 137 lb 6.4 oz (62.3 kg)   SpO2 93%   BMI 25.96 kg/m   General: Well-appearing. Resting comfortably in room. CV: Soft systolic murmur, Z6/X0. No extra heart sounds. Warm and well-perfused throughout.  Pulm: Breathing comfortably on room air. CTAB. No increased WOB. Abd: Soft, non-tender, non-distended. Ext: Taut RLE, diffuse pitting edema to level of the knee, nontender to palpation, normal cap refill. Normal and equal DP pulses bilaterally. Sensation intact bilaterally.  Skin:  Warm, dry. Psych: Pleasant and appropriate.     ASSESSMENT/PLAN:   Assessment & Plan Right leg swelling Chronic PAD s/p right femoral endarterectomy with vein patch angioplasty and femoral to below the knee palpitated bypass with PTFE 10/03/2020. Physical exam today with low  suspicion for DVT or infectious process. Suspect chronic swelling from long history of PAD s/p procedures.  - Encouraged patient to see her Vascular provider as she was advised to do - ED precautions discussed Muscle spasm Chronic painful muscle spasms that have become less well-controlled on current medication regimen.  - Discussed the risks of falls with Robaxin use. Patient expressed understanding of risk and elected to try a higher dosage. - Ordered Robaxin 750 mg q8 PRN. Advised patient could take 1.5 tablets of her current 500 mg tablets q8 prn while she awaits the new prescription.  - Discussed stopping the tramadol as it is causing her GI upset, and see how the higher dose of Robaxin works for her  Patient scheduled for follow up on 02/17/24. Return to clinic sooner as needed.   [name] Quale, MD [facility]

## 2024-01-19 NOTE — Assessment & Plan Note (Signed)
Chronic PAD s/p right femoral endarterectomy with vein patch angioplasty and femoral to below the knee palpitated bypass with PTFE 10/03/2020. Physical exam today with low suspicion for DVT or infectious process. Suspect chronic swelling from long history of PAD s/p procedures.  - Encouraged patient to see her Vascular provider as she was advised to do - ED precautions discussed

## 2024-01-19 NOTE — Assessment & Plan Note (Signed)
Chronic painful muscle spasms that have become less well-controlled on current medication regimen.  - Discussed the risks of falls with Robaxin use. Patient expressed understanding of risk and elected to try a higher dosage. - Ordered Robaxin 750 mg q8 PRN. Advised patient could take 1.5 tablets of her current 500 mg tablets q8 prn while she awaits the new prescription.  - Discussed stopping the tramadol as it is causing her GI upset, and see how the higher dose of Robaxin works for her

## 2024-01-24 ENCOUNTER — Other Ambulatory Visit: Payer: Self-pay

## 2024-01-24 DIAGNOSIS — Z716 Tobacco abuse counseling: Secondary | ICD-10-CM

## 2024-01-27 ENCOUNTER — Ambulatory Visit (INDEPENDENT_AMBULATORY_CARE_PROVIDER_SITE_OTHER): Payer: 59 | Admitting: Student

## 2024-01-27 VITALS — BP 130/72 | HR 71 | Wt 133.6 lb

## 2024-01-27 DIAGNOSIS — J441 Chronic obstructive pulmonary disease with (acute) exacerbation: Secondary | ICD-10-CM | POA: Diagnosis not present

## 2024-01-27 MED ORDER — AZITHROMYCIN 500 MG PO TABS: 500.0000 mg | ORAL_TABLET | Freq: Every day | ORAL | 0 refills | Status: AC

## 2024-01-27 MED ORDER — PREDNISONE 50 MG PO TABS: 50.0000 mg | ORAL_TABLET | Freq: Every day | ORAL | 0 refills | Status: DC

## 2024-01-27 MED ORDER — AZITHROMYCIN 500 MG PO TABS: 500.0000 mg | ORAL_TABLET | Freq: Every day | ORAL | 0 refills | Status: DC

## 2024-01-27 MED ORDER — GUAIFENESIN ER 600 MG PO TB12: 600.0000 mg | ORAL_TABLET | Freq: Two times a day (BID) | ORAL | 0 refills | Status: AC

## 2024-01-27 MED ORDER — PREDNISONE 50 MG PO TABS: 50.0000 mg | ORAL_TABLET | Freq: Every day | ORAL | 0 refills | Status: AC

## 2024-01-27 MED ORDER — GUAIFENESIN ER 600 MG PO TB12: 600.0000 mg | ORAL_TABLET | Freq: Two times a day (BID) | ORAL | 0 refills | Status: DC

## 2024-01-27 NOTE — Progress Notes (Signed)
    SUBJECTIVE:   CHIEF COMPLAINT / HPI:   78 year old female history of  HF, COPD and Tobacco use Presenting today due to concerns of recent illness. Reports symptom started in the last 24 hours with generalized fatigue and cough. Other associated symptoms include loose and loose stool. Office productive of clear and sometimes greenish sputum. Also reports some shortness of breath that is worse with coughing. Known sick contact in grandson who stayed with patient and her husband and a week ago.   Also husband currently sick with similar symptoms of runny nose and cough. No blood in bowel movement. Denies any fever, chills or chest pain.  No lower extremity edema's. Recent Chest CT was 10/04/23 negative for pulmonary process   PERTINENT  PMH / PSH: Reviewed  OBJECTIVE:   BP 130/72   Pulse 71   Wt 133 lb 9.6 oz (60.6 kg)   SpO2 97%   BMI 25.24 kg/m    Physical Exam General: Alert, ill-appearing but nontoxic, NAD Cardiovascular: RRR, No Murmurs, Normal S2/S2 Respiratory: Coarse breath sounds with remittent wheezing, persistent coughing Abdomen: No distension or tenderness Extremities: No edema on extremities     ASSESSMENT/PLAN:   COPD (chronic obstructive pulmonary disease) (HCC) Patient's history and exam findings are consistent with COPD exacerbations in the setting of current viral URI and continued tobacco use.  O2 sats show 97% and on exam have coarse breath sounds with intermittent wheezing .  Given concerns of COPD exacerbations with green sputum production with cough will treat with steroid and antibiotics.  O2 sats goals for COPD patients 88-92% -Rx prednisone 50 mg for 5 days -Continue albuterol as needed -Rx Mucinex twice daily for 5 days -Discussed importance of smoking cessation -Rx Azithromycin 500 mg for 3 days   Jerre Simon, MD [facility]

## 2024-01-27 NOTE — Assessment & Plan Note (Addendum)
Patient's history and exam findings are consistent with COPD exacerbations in the setting of current viral URI and continued tobacco use.  O2 sats show 97% and on exam have coarse breath sounds with intermittent wheezing .  Given concerns of COPD exacerbations with green sputum production with cough will treat with steroid and antibiotics.  O2 sats goals for COPD patients 88-92% -Rx prednisone 50 mg for 5 days -Continue albuterol as needed -Rx Mucinex twice daily for 5 days -Discussed importance of smoking cessation -Rx Azithromycin 500 mg for 3 days

## 2024-01-27 NOTE — Patient Instructions (Signed)
Pleasure to meet you today.  Suspect you most likely have upper respiratory viral illness which has exacerbated your COPD.  I have sent in prescription for steroid which you take 50 mg daily for 5 days Mucinex which you will take 2 times daily for your cough.  Also sent in prescription for azithromycin antibiotics which you will take once daily for 3 days.  You will benefit from holding off smoking for few days to help improve your breathing.  Recommend doing Tylenol for pain or fever during this period  Follow-up  in 5-7 days.

## 2024-01-31 MED ORDER — VARENICLINE TARTRATE 1 MG PO TABS: 1.0000 mg | ORAL_TABLET | Freq: Two times a day (BID) | ORAL | 0 refills | Status: DC

## 2024-02-02 ENCOUNTER — Ambulatory Visit: Payer: 59 | Admitting: Family Medicine

## 2024-02-03 ENCOUNTER — Other Ambulatory Visit: Payer: Self-pay

## 2024-02-03 ENCOUNTER — Encounter (HOSPITAL_COMMUNITY): Payer: Self-pay

## 2024-02-03 ENCOUNTER — Emergency Department (HOSPITAL_COMMUNITY): Payer: 59

## 2024-02-03 ENCOUNTER — Inpatient Hospital Stay (HOSPITAL_COMMUNITY)
Admission: EM | Admit: 2024-02-03 | Discharge: 2024-02-06 | DRG: 208 | Disposition: A | Discharge status: Home / Self Care | Payer: 59 | Attending: Family Medicine | Admitting: Family Medicine

## 2024-02-03 DIAGNOSIS — E1140 Type 2 diabetes mellitus with diabetic neuropathy, unspecified: Secondary | ICD-10-CM | POA: Diagnosis present

## 2024-02-03 DIAGNOSIS — Z8249 Family history of ischemic heart disease and other diseases of the circulatory system: Secondary | ICD-10-CM

## 2024-02-03 DIAGNOSIS — J441 Chronic obstructive pulmonary disease with (acute) exacerbation: Secondary | ICD-10-CM | POA: Diagnosis present

## 2024-02-03 DIAGNOSIS — E785 Hyperlipidemia, unspecified: Secondary | ICD-10-CM | POA: Diagnosis present

## 2024-02-03 DIAGNOSIS — Z833 Family history of diabetes mellitus: Secondary | ICD-10-CM

## 2024-02-03 DIAGNOSIS — Z841 Family history of disorders of kidney and ureter: Secondary | ICD-10-CM

## 2024-02-03 DIAGNOSIS — J45901 Unspecified asthma with (acute) exacerbation: Secondary | ICD-10-CM | POA: Diagnosis present

## 2024-02-03 DIAGNOSIS — Z8673 Personal history of transient ischemic attack (TIA), and cerebral infarction without residual deficits: Secondary | ICD-10-CM

## 2024-02-03 DIAGNOSIS — Z7985 Long-term (current) use of injectable non-insulin antidiabetic drugs: Secondary | ICD-10-CM

## 2024-02-03 DIAGNOSIS — N1832 Chronic kidney disease, stage 3b: Secondary | ICD-10-CM | POA: Diagnosis present

## 2024-02-03 DIAGNOSIS — Z7984 Long term (current) use of oral hypoglycemic drugs: Secondary | ICD-10-CM

## 2024-02-03 DIAGNOSIS — E1122 Type 2 diabetes mellitus with diabetic chronic kidney disease: Secondary | ICD-10-CM | POA: Diagnosis present

## 2024-02-03 DIAGNOSIS — Z825 Family history of asthma and other chronic lower respiratory diseases: Secondary | ICD-10-CM

## 2024-02-03 DIAGNOSIS — J440 Chronic obstructive pulmonary disease with acute lower respiratory infection: Secondary | ICD-10-CM | POA: Diagnosis present

## 2024-02-03 DIAGNOSIS — F17210 Nicotine dependence, cigarettes, uncomplicated: Secondary | ICD-10-CM | POA: Diagnosis present

## 2024-02-03 DIAGNOSIS — I130 Hypertensive heart and chronic kidney disease with heart failure and stage 1 through stage 4 chronic kidney disease, or unspecified chronic kidney disease: Secondary | ICD-10-CM | POA: Diagnosis present

## 2024-02-03 DIAGNOSIS — Z794 Long term (current) use of insulin: Secondary | ICD-10-CM | POA: Diagnosis not present

## 2024-02-03 DIAGNOSIS — T380X5A Adverse effect of glucocorticoids and synthetic analogues, initial encounter: Secondary | ICD-10-CM | POA: Diagnosis not present

## 2024-02-03 DIAGNOSIS — Z86011 Personal history of benign neoplasm of the brain: Secondary | ICD-10-CM

## 2024-02-03 DIAGNOSIS — E1169 Type 2 diabetes mellitus with other specified complication: Secondary | ICD-10-CM | POA: Diagnosis not present

## 2024-02-03 DIAGNOSIS — R0603 Acute respiratory distress: Secondary | ICD-10-CM | POA: Diagnosis present

## 2024-02-03 DIAGNOSIS — J9602 Acute respiratory failure with hypercapnia: Secondary | ICD-10-CM | POA: Diagnosis present

## 2024-02-03 DIAGNOSIS — I5032 Chronic diastolic (congestive) heart failure: Secondary | ICD-10-CM | POA: Diagnosis present

## 2024-02-03 DIAGNOSIS — J9601 Acute respiratory failure with hypoxia: Secondary | ICD-10-CM | POA: Diagnosis present

## 2024-02-03 DIAGNOSIS — Z7951 Long term (current) use of inhaled steroids: Secondary | ICD-10-CM

## 2024-02-03 DIAGNOSIS — Z95820 Peripheral vascular angioplasty status with implants and grafts: Secondary | ICD-10-CM

## 2024-02-03 DIAGNOSIS — F329 Major depressive disorder, single episode, unspecified: Secondary | ICD-10-CM | POA: Diagnosis present

## 2024-02-03 DIAGNOSIS — Z830 Family history of human immunodeficiency virus [HIV] disease: Secondary | ICD-10-CM

## 2024-02-03 DIAGNOSIS — K219 Gastro-esophageal reflux disease without esophagitis: Secondary | ICD-10-CM | POA: Diagnosis present

## 2024-02-03 DIAGNOSIS — Z9089 Acquired absence of other organs: Secondary | ICD-10-CM

## 2024-02-03 DIAGNOSIS — Z9889 Other specified postprocedural states: Secondary | ICD-10-CM

## 2024-02-03 DIAGNOSIS — Z823 Family history of stroke: Secondary | ICD-10-CM

## 2024-02-03 DIAGNOSIS — Z83438 Family history of other disorder of lipoprotein metabolism and other lipidemia: Secondary | ICD-10-CM

## 2024-02-03 DIAGNOSIS — E1151 Type 2 diabetes mellitus with diabetic peripheral angiopathy without gangrene: Secondary | ICD-10-CM | POA: Diagnosis present

## 2024-02-03 DIAGNOSIS — U071 COVID-19: Secondary | ICD-10-CM | POA: Diagnosis present

## 2024-02-03 DIAGNOSIS — Z886 Allergy status to analgesic agent status: Secondary | ICD-10-CM

## 2024-02-03 DIAGNOSIS — Z79899 Other long term (current) drug therapy: Secondary | ICD-10-CM

## 2024-02-03 DIAGNOSIS — T375X5A Adverse effect of antiviral drugs, initial encounter: Secondary | ICD-10-CM | POA: Diagnosis not present

## 2024-02-03 DIAGNOSIS — Z856 Personal history of leukemia: Secondary | ICD-10-CM

## 2024-02-03 DIAGNOSIS — Z818 Family history of other mental and behavioral disorders: Secondary | ICD-10-CM

## 2024-02-03 DIAGNOSIS — Z91018 Allergy to other foods: Secondary | ICD-10-CM

## 2024-02-03 DIAGNOSIS — Z888 Allergy status to other drugs, medicaments and biological substances status: Secondary | ICD-10-CM

## 2024-02-03 DIAGNOSIS — Z98891 History of uterine scar from previous surgery: Secondary | ICD-10-CM

## 2024-02-03 DIAGNOSIS — Y92238 Other place in hospital as the place of occurrence of the external cause: Secondary | ICD-10-CM | POA: Diagnosis not present

## 2024-02-03 DIAGNOSIS — R112 Nausea with vomiting, unspecified: Secondary | ICD-10-CM

## 2024-02-03 DIAGNOSIS — Z716 Tobacco abuse counseling: Secondary | ICD-10-CM

## 2024-02-03 DIAGNOSIS — Z808 Family history of malignant neoplasm of other organs or systems: Secondary | ICD-10-CM

## 2024-02-03 DIAGNOSIS — J1282 Pneumonia due to coronavirus disease 2019: Secondary | ICD-10-CM | POA: Diagnosis present

## 2024-02-03 DIAGNOSIS — Z7902 Long term (current) use of antithrombotics/antiplatelets: Secondary | ICD-10-CM

## 2024-02-03 DIAGNOSIS — Z86718 Personal history of other venous thrombosis and embolism: Secondary | ICD-10-CM

## 2024-02-03 DIAGNOSIS — E119 Type 2 diabetes mellitus without complications: Secondary | ICD-10-CM

## 2024-02-03 LAB — CBC WITH DIFFERENTIAL/PLATELET
Abs Immature Granulocytes: 0.18 10*3/uL — ABNORMAL HIGH (ref 0.00–0.07)
Basophils Absolute: 0.1 10*3/uL (ref 0.0–0.1)
Basophils Relative: 1 %
Eosinophils Absolute: 0 10*3/uL (ref 0.0–0.5)
Eosinophils Relative: 0 %
HCT: 44.9 % (ref 36.0–46.0)
Hemoglobin: 13.8 g/dL (ref 12.0–15.0)
Immature Granulocytes: 1 %
Lymphocytes Relative: 31 %
Lymphs Abs: 5.1 10*3/uL — ABNORMAL HIGH (ref 0.7–4.0)
MCH: 27.9 pg (ref 26.0–34.0)
MCHC: 30.7 g/dL (ref 30.0–36.0)
MCV: 90.9 fL (ref 80.0–100.0)
Monocytes Absolute: 0.9 10*3/uL (ref 0.1–1.0)
Monocytes Relative: 5 %
Neutro Abs: 10.3 10*3/uL — ABNORMAL HIGH (ref 1.7–7.7)
Neutrophils Relative %: 62 %
Platelets: 234 10*3/uL (ref 150–400)
RBC: 4.94 MIL/uL (ref 3.87–5.11)
RDW: 17.8 % — ABNORMAL HIGH (ref 11.5–15.5)
WBC: 16.5 10*3/uL — ABNORMAL HIGH (ref 4.0–10.5)
nRBC: 0 % (ref 0.0–0.2)

## 2024-02-03 LAB — BASIC METABOLIC PANEL
Anion gap: 15 (ref 5–15)
BUN: 14 mg/dL (ref 8–23)
CO2: 20 mmol/L — ABNORMAL LOW (ref 22–32)
Calcium: 8.7 mg/dL — ABNORMAL LOW (ref 8.9–10.3)
Chloride: 105 mmol/L (ref 98–111)
Creatinine, Ser: 1.47 mg/dL — ABNORMAL HIGH (ref 0.44–1.00)
GFR, Estimated: 37 mL/min — ABNORMAL LOW (ref >60)
Glucose, Bld: 368 mg/dL — ABNORMAL HIGH (ref 70–99)
Potassium: 3.4 mmol/L — ABNORMAL LOW (ref 3.5–5.1)
Sodium: 140 mmol/L (ref 135–145)

## 2024-02-03 LAB — I-STAT VENOUS BLOOD GAS, ED
Acid-base deficit: 3 mmol/L — ABNORMAL HIGH (ref 0.0–2.0)
Bicarbonate: 21.1 mmol/L (ref 20.0–28.0)
Calcium, Ion: 1.12 mmol/L — ABNORMAL LOW (ref 1.15–1.40)
HCT: 42 % (ref 36.0–46.0)
Hemoglobin: 14.3 g/dL (ref 12.0–15.0)
O2 Saturation: 98 %
Potassium: 3.4 mmol/L — ABNORMAL LOW (ref 3.5–5.1)
Sodium: 141 mmol/L (ref 135–145)
TCO2: 22 mmol/L (ref 22–32)
pCO2, Ven: 33.8 mm[Hg] — ABNORMAL LOW (ref 44–60)
pH, Ven: 7.403 (ref 7.25–7.43)
pO2, Ven: 112 mm[Hg] — ABNORMAL HIGH (ref 32–45)

## 2024-02-03 LAB — TROPONIN I (HIGH SENSITIVITY)
Troponin I (High Sensitivity): 11 ng/L (ref <18)
Troponin I (High Sensitivity): 9 ng/L (ref <18)

## 2024-02-03 LAB — RESP PANEL BY RT-PCR (RSV, FLU A&B, COVID)  RVPGX2
Influenza A by PCR: NEGATIVE
Influenza B by PCR: NEGATIVE
Resp Syncytial Virus by PCR: NEGATIVE
SARS Coronavirus 2 by RT PCR: POSITIVE — AB

## 2024-02-03 LAB — GLUCOSE, CAPILLARY: Glucose-Capillary: 268 mg/dL — ABNORMAL HIGH (ref 70–99)

## 2024-02-03 LAB — BRAIN NATRIURETIC PEPTIDE: B Natriuretic Peptide: 22.2 pg/mL (ref 0.0–100.0)

## 2024-02-03 MED ORDER — CLOPIDOGREL BISULFATE 75 MG PO TABS: 75.0000 mg | ORAL_TABLET | Freq: Every day | ORAL | 3 refills | Status: DC

## 2024-02-03 MED ORDER — IPRATROPIUM-ALBUTEROL 0.5-2.5 (3) MG/3ML IN SOLN
3.0000 mL | Freq: Once | RESPIRATORY_TRACT | Status: AC
  Administered 2024-02-03: 3 mL via RESPIRATORY_TRACT
  Filled 2024-02-03: qty 3

## 2024-02-03 MED ORDER — CLOPIDOGREL BISULFATE 75 MG PO TABS: 75.0000 mg | ORAL_TABLET | Freq: Every day | ORAL | Status: DC

## 2024-02-03 MED ORDER — CLOPIDOGREL BISULFATE 75 MG PO TABS
75.0000 mg | ORAL_TABLET | Freq: Every day | ORAL | Status: DC
  Administered 2024-02-04 – 2024-02-06 (×3): 75 mg via ORAL
  Filled 2024-02-03 (×3): qty 1

## 2024-02-03 MED ORDER — ENOXAPARIN SODIUM 30 MG/0.3ML IJ SOSY
30.0000 mg | PREFILLED_SYRINGE | INTRAMUSCULAR | Status: DC
  Administered 2024-02-04 – 2024-02-06 (×3): 30 mg via SUBCUTANEOUS
  Filled 2024-02-03 (×3): qty 0.3

## 2024-02-03 MED ORDER — POTASSIUM CHLORIDE 10 MEQ/100ML IV SOLN
10.0000 meq | INTRAVENOUS | Status: AC
  Administered 2024-02-03 (×4): 10 meq via INTRAVENOUS
  Filled 2024-02-03 (×4): qty 100

## 2024-02-03 MED ORDER — DULOXETINE HCL 60 MG PO CPEP
60.0000 mg | ORAL_CAPSULE | Freq: Every day | ORAL | Status: DC
  Administered 2024-02-04 – 2024-02-06 (×3): 60 mg via ORAL
  Filled 2024-02-03 (×3): qty 1

## 2024-02-03 MED ORDER — SODIUM CHLORIDE 0.9 % IV BOLUS
1000.0000 mL | Freq: Once | INTRAVENOUS | Status: AC
  Administered 2024-02-03: 1000 mL via INTRAVENOUS

## 2024-02-03 MED ORDER — ENOXAPARIN SODIUM 40 MG/0.4ML IJ SOSY: 40.0000 mg | PREFILLED_SYRINGE | INTRAMUSCULAR | Status: DC

## 2024-02-03 MED ORDER — IPRATROPIUM-ALBUTEROL 0.5-2.5 (3) MG/3ML IN SOLN
3.0000 mL | RESPIRATORY_TRACT | Status: DC | PRN
  Administered 2024-02-04: 3 mL via RESPIRATORY_TRACT
  Filled 2024-02-03: qty 3

## 2024-02-03 MED ORDER — SODIUM CHLORIDE 0.9 % IV SOLN: 100.0000 mg | Freq: Two times a day (BID) | INTRAVENOUS | Status: DC

## 2024-02-03 MED ORDER — IPRATROPIUM-ALBUTEROL 0.5-2.5 (3) MG/3ML IN SOLN
3.0000 mL | RESPIRATORY_TRACT | Status: DC
  Administered 2024-02-03: 3 mL via RESPIRATORY_TRACT
  Filled 2024-02-03: qty 3

## 2024-02-03 MED ORDER — NIRMATRELVIR/RITONAVIR (PAXLOVID) TABLET (RENAL DOSING)
2.0000 | ORAL_TABLET | Freq: Two times a day (BID) | ORAL | Status: DC
  Administered 2024-02-03 – 2024-02-06 (×6): 2 via ORAL
  Filled 2024-02-03: qty 20

## 2024-02-03 MED ORDER — METHYLPREDNISOLONE SODIUM SUCC 40 MG IJ SOLR: 40.0000 mg | Freq: Once | INTRAMUSCULAR | Status: DC

## 2024-02-03 MED ORDER — MONTELUKAST SODIUM 10 MG PO TABS
10.0000 mg | ORAL_TABLET | Freq: Every day | ORAL | Status: DC
  Administered 2024-02-03 – 2024-02-05 (×3): 10 mg via ORAL
  Filled 2024-02-03 (×3): qty 1

## 2024-02-03 MED ORDER — IPRATROPIUM-ALBUTEROL 0.5-2.5 (3) MG/3ML IN SOLN: 3.0000 mL | Freq: Once | RESPIRATORY_TRACT | Status: DC

## 2024-02-03 MED ORDER — FLUTICASONE FUROATE-VILANTEROL 100-25 MCG/ACT IN AEPB
1.0000 | INHALATION_SPRAY | Freq: Every day | RESPIRATORY_TRACT | Status: DC
  Administered 2024-02-04 – 2024-02-06 (×3): 1 via RESPIRATORY_TRACT
  Filled 2024-02-03: qty 28

## 2024-02-03 MED ORDER — ATORVASTATIN CALCIUM 80 MG PO TABS
80.0000 mg | ORAL_TABLET | Freq: Every day | ORAL | Status: DC
  Administered 2024-02-04 – 2024-02-06 (×3): 80 mg via ORAL
  Filled 2024-02-03 (×3): qty 1

## 2024-02-03 MED ORDER — IPRATROPIUM-ALBUTEROL 0.5-2.5 (3) MG/3ML IN SOLN
3.0000 mL | Freq: Three times a day (TID) | RESPIRATORY_TRACT | Status: DC
  Administered 2024-02-04 – 2024-02-05 (×4): 3 mL via RESPIRATORY_TRACT
  Filled 2024-02-03 (×4): qty 3

## 2024-02-03 MED ORDER — GABAPENTIN 300 MG PO CAPS
600.0000 mg | ORAL_CAPSULE | Freq: Every day | ORAL | Status: DC
  Administered 2024-02-03 – 2024-02-05 (×3): 600 mg via ORAL
  Filled 2024-02-03 (×3): qty 2

## 2024-02-03 NOTE — Telephone Encounter (Signed)
 Chart reviewed. Rx refilled.

## 2024-02-03 NOTE — ED Triage Notes (Signed)
 Patient BIB [facility] c/o sob.  Patient has been sick with a cough x 2 weeks.  EMS reports patient's room air sats were 82%, so they placed on her CPAP, gave 2 dup nebs, 2 g of mag, and 125 mg of solu-medrol . 20 L FA

## 2024-02-03 NOTE — H&P (Signed)
 Hospital Admission History and Physical Service Pager: [phone]  Patient name: [name] [name] Medical record number: [id number] Date of Birth: [birth year] Age: 78 y.o. Gender: female  Primary Care Provider: Diona Perkins, MD Consultants: None Code Status: Full code Preferred Emergency Contact:  Contact Information     Name Relation Home Work Mobile   [place]ton,Herbert Spouse [phone]  [phone]   Elner, Seifert [phone]   Dominica Riffle Daughter   [phone]      Other Contacts     Name Relation Home Work Mobile   [place] Daughter [phone]  [phone]   Foxx,Denise Sister   [phone]   Layken, Beg   [phone]        Chief Complaint: SOB  Assessment and Plan: CAMYAH PULTZ is a 78 y.o. female presenting with shortness of breath and wheezing. PMHx includes T2DM, HTN, tobacco use, asthma, CHF, CKDIIIb, CLL, CVA, brain tumor, GERD, HLD and PVD.  Differential for presentation of this includes: Asthma/COPD exacerbation: Likely exacerbation in the setting of underlying viral infection and smoking.  Meets criteria given increased sputum production, dyspnea and diffuse wheezing. COVID Pneumonia: Positive upon admission and high risk for severe infection.  Chest x-ray otherwise negative no focal consolidation noted upon exam.  Low concern for underlying bacterial process. PE: Well score of 0, not anticoagulated but low concern given other more likely diagnosis. CHF exacerbation: Grossly euvolemic upon exam and BNP normal.  Lower suspicion for underlying cardiac component.  Assessment & Plan Acute exacerbation of COPD with asthma (HCC) Dyspneic with labored breathing on BiPAP but likely secondary to BiPAP settings given patient appears to be breathing over the machine.  Significant productive cough likely exacerbating dyspnea, will manage symptomatically. Likely viral trigger as below, low concern for  secondary bacterial process therefore will not initiate antibiotics at this time. -Admitted to [facility], Dr. Rosalynn attending -DuoNebs every 4 hour and every 2 hours as needed -Wean oxygen  as tolerated -Redose Solu-Medrol  now, will reevaluate in the a.m. to possibly transition to oral prednisone  -NPO given BiPAP -Aggressive electrolyte repletion -Consider cough suppressant and anti-anxiety lytic if ongoing dyspnea COVID High risk for severe infection, therefore will treat with Paxlovid . -Renally dose Paxlovid  -Supportive care   Chronic and Stable Problems: PVD: Continue home Plavix  75 mg daily T2DM: CBG checks.  Hold home metformin  1000 mg daily, Trulicity  and Jardiance  25 mg daily HTN: Normotensive.  Hold home amlodipine  10 mg daily, losartan  50 mg daily HLD: Continue home atorvastatin  80 mg daily MDD: Continue duloxetine  60 mg daily CKDIIIb: Creatinine 1.47, baseline ~1.4   FEN/GI: NPO on BiPap VTE Prophylaxis: Lovenox   Disposition: Progressive  History of Present Illness:  [name] [name] is a 78 y.o. female presenting with shortness of breath.  Husband at bedside providing additional history.  Reports husband and other family members were sick with a cold for the patient became sick.  Reports she has been having symptoms of cough and increased white phlegm production for the past 2 weeks.  Some chills but denies fever.  Denies vomiting.  Reports diarrhea about twice per day for the past week.  Otherwise staying hydrated and eating well.  Reports she has been getting worse over this time period.  Has been reports she may acutely worsened today with significant shortness of breath.  Unclear trigger.  Arrived via EMS, found to be satting in 80s, placed on CPAP and given DuoNeb, Solu-Medrol  and mag.  In the ED, transition to BiPAP given ongoing respiratory difficulties given  additional DuoNeb's x 2.  Review Of Systems: Per HPI  Pertinent Past Medical  History: Asthma COPD Allergies Meningioma CKD CHF CLL T2DM CVA HLD HTN PVD Remainder reviewed in history tab.   Pertinent Past Surgical History: Meningioma excision Cholecystectomy Femoropopliteal bypass Remainder reviewed in history tab.   Pertinent Social History: Tobacco use: Current, 1-2 cigarettes/day Alcohol  use: None Other Substance use: None Lives with husband and daughter  Pertinent Family History: Nonpertinent Remainder reviewed in history tab.   Important Outpatient Medications: Took morning pill pack meds  Prednisone  50mg   Amlodipine  10mg  daily Took inhalers Remainder reviewed in medication history.   Objective: BP (!) 152/73   Pulse 94   Temp 98.1 F (36.7 C) (Axillary)   Resp (!) 30   Ht 5' 2 (1.575 m)   Wt 61.2 kg   SpO2 97%   BMI 24.69 kg/m  Exam: General: Sitting up in bed with BiPAP in place.  Mildly laborious breathing without acute distress.  Intermittent, deep productive cough. Eyes: PERRLA, anicteric sclera ENTM: Moist mucus membranes. Neck: Supple, non-tender.  No prominent JVD noted Cardiovascular: RRR without murmur Respiratory: Mildly labored breathing on BiPAP, appears to be breathing over inspiratory pressure.  Moderate diffuse wheezing and rhonchi. Gastrointestinal: Soft, nondistended.  Mildly tender to palpation diffusely.  + Bowel sounds MSK: Trace peripheral edema bilaterally Derm: Warm, dry, no rashes noted Neuro: Motor and sensation intact globally Psych: Cooperative, pleasant   Labs:  CBC BMET  Recent Labs  Lab 02/03/24 1508 02/03/24 1519  WBC 16.5*  --   HGB 13.8 14.3  HCT 44.9 42.0  PLT 234  --    Recent Labs  Lab 02/03/24 1508 02/03/24 1519  NA 140 141  K 3.4* 3.4*  CL 105  --   CO2 20*  --   BUN 14  --   CREATININE 1.47*  --   GLUCOSE 368*  --   CALCIUM  8.7*  --     Pertinent additional labs: ABG: 7.4/33/112/21 Quad screen: COVID +  EKG: NSR  Imaging Studies Performed: DG Chest Port 1  View Result Date: 02/03/2024 IMPRESSION: No active disease.    Theophilus Pagan, MD 02/03/2024, 4:58 PM PGY-2, [facility]  FPTS Intern pager: [phone], text pages welcome Secure chat group [facility] Teaching Service

## 2024-02-03 NOTE — Assessment & Plan Note (Signed)
 Dyspneic with labored breathing on BiPAP but likely secondary to BiPAP settings given patient appears to be breathing over the machine.  Significant productive cough likely exacerbating dyspnea, will manage symptomatically. Likely viral trigger as below, low concern for secondary bacterial process therefore will not initiate antibiotics at this time. -Admitted to FMTS, Dr. Rosalynn attending -DuoNebs every 4 hour and every 2 hours as needed -Wean oxygen  as tolerated -Redose Solu-Medrol  now, will reevaluate in the a.m. to possibly transition to oral prednisone  -NPO given BiPAP -Aggressive electrolyte repletion -Consider cough suppressant and anti-anxiety lytic if ongoing dyspnea

## 2024-02-03 NOTE — Progress Notes (Signed)
 Patient transported from ED 26 to 2C02. Patient tolerated well. No complications. Vitals stable throughout. Report given to 2C RT. RT will continue to monitor.

## 2024-02-03 NOTE — ED Provider Notes (Signed)
 [place] EMERGENCY DEPARTMENT AT [facility] Provider Note  CSN: [id number] Arrival date & time: 02/03/24 1457  Chief Complaint(s) Shortness of Breath  HPI [name] [name] is a 78 y.o. female with past medical history as below, significant for diastolic HF, COPD, CVA, PVD, HLD, HTN who presents to the ED with complaint of dib  Feeling well for the past 2 weeks, gradually worsening.  Productive cough with yellow or brown sputum.  Exertional dyspnea.  Is no longer on home oxygen  because she was continue to smoke cigarettes.  On MS arrival pulse ox was 82%.  Placed on CPAP, given nebulized albuterol  and Atrovent , Solu-Medrol  and mag sulfate.  Modest improvement to respiratory status.  Transition to BiPAP on arrival.  Continue improvement respiratory status.  Past Medical History Past Medical History:  Diagnosis Date   Allergy    environmental   Asthma    Brain tumor (HCC)    Chronic diastolic CHF (congestive heart failure) (HCC)    CKD (chronic kidney disease)    CLL (chronic lymphocytic leukemia) (HCC)    Constipation 11/15/2011   COPD (chronic obstructive pulmonary disease) (HCC)    CVA (cerebral vascular accident) (HCC)    Diabetes (HCC)    GERD (gastroesophageal reflux disease)    Headache    migraines   Hyperlipidemia    Hypertension    PVD (peripheral vascular disease) (HCC)    Stroke (HCC) 1998   in 1998 due to tumor-right side   Patient Active Problem List   Diagnosis Date Noted   COPD exacerbation (HCC) 02/03/2024   Foraminal stenosis of lumbosacral region 01/14/2023   Right leg swelling 09/03/2022   Meningioma (HCC) 09/03/2022   Polypharmacy 04/07/2022   Iron  deficiency anemia 02/12/2021   Peripheral arterial disease (HCC) 10/03/2020   (HFpEF) heart failure with preserved ejection fraction (HCC) 06/11/2020   Muscle spasm 03/07/2020   Venous insufficiency (chronic) (peripheral) 06/22/2019   Pulmonary nodules 01/04/2018   Mass of left lower leg  01/04/2018   Chronic kidney disease (CKD), stage III (moderate) (HCC) 06/04/2016   Chronic back pain 10/02/2015   DM (diabetes mellitus), type 2 with peripheral vascular complications (HCC) 06/11/2009   Chronic lymphocytic leukemia (HCC) 04/11/2009   HYPERCHOLESTEROLEMIA 02/23/2007   TOBACCO DEPENDENCE 02/23/2007   Essential hypertension 02/23/2007   COPD (chronic obstructive pulmonary disease) (HCC) 02/23/2007   GASTROESOPHAGEAL REFLUX, NO ESOPHAGITIS 02/23/2007   Home Medication(s) Prior to Admission medications   Medication Sig Start Date End Date Taking? Authorizing Provider  predniSONE  (DELTASONE ) 50 MG tablet Take 50 mg by mouth daily. 01/31/24  Yes [provider]  acetaminophen  (TYLENOL ) 500 MG tablet Take 500-1,000 mg by mouth every 6 (six) hours as needed for moderate pain.    [provider]  albuterol  (PROVENTIL ) (2.5 MG/3ML) 0.083% nebulizer solution Take 3 mLs (2.5 mg total) by nebulization every 6 (six) hours as needed for wheezing or shortness of breath. 07/29/23   Diona Perkins, MD  albuterol  (VENTOLIN  HFA) 108 [id number] Base) MCG/ACT inhaler Inhale 2 puffs into the lungs every 4 (four) hours as needed for wheezing or shortness of breath. 07/28/23   Diona Perkins, MD  Alcohol  Swabs (ALCOHOL  PADS) 70 % PADS  11/27/20   [provider]  amLODipine  (NORVASC ) 10 MG tablet Take 1 tablet (10 mg total) by mouth daily. 07/15/23   Theophilus Pagan, MD  atorvastatin  (LIPITOR ) 80 MG tablet Take 1 tablet by mouth daily. 12/09/23   Everhart, Kirstie, DO  Blood Glucose Calibration (OT ULTRA/FASTTK  CNTRL SOLN) SOLN See admin instructions. Patient not taking: Reported on 02/03/2024 11/27/20   [provider]  blood glucose meter kit and supplies KIT Dispense based on patient and insurance preference. Use up to four times daily as directed. Patient not taking: Reported on 02/03/2024 05/31/22   Malvina Ellen, MD  Blood Glucose Monitoring Suppl (ACCU-CHEK GUIDE ME) w/Device KIT  Use to test blood sugar twice per day. E11.9 Patient not taking: Reported on 02/03/2024 06/03/22   Malvina Ellen, MD  BREO ELLIPTA  100-25 MCG/ACT AEPB Inhale 1 puff by mouth every day Patient taking differently: Inhale 1 puff into the lungs daily. 11/28/23   Diona Perkins, MD  clopidogrel  (PLAVIX ) 75 MG tablet Take 1 tablet (75 mg total) by mouth daily. 09/22/23   Diona Perkins, MD  DULoxetine  (CYMBALTA ) 60 MG capsule Take 1 capsule (60 mg total) by mouth daily. 09/22/23   Diona Perkins, MD  empagliflozin  (JARDIANCE ) 25 MG TABS tablet Take 1 tablet (25 mg total) by mouth daily. 07/29/23   Diona Perkins, MD  famotidine  (PEPCID ) 20 MG tablet Take 1 tablet by mouth twice daily 11/28/23   Diona Perkins, MD  furosemide  (LASIX ) 20 MG tablet Take 1 tablet (20 mg total) by mouth daily. 09/22/23   Diona Perkins, MD  gabapentin  (NEURONTIN ) 300 MG capsule Take 2 capsules (600 mg total) by mouth at bedtime. For nerve pain 10/12/23   Lovorn, Megan, MD  glucose blood (ONETOUCH VERIO) test strip Use to check blood sugar three times daily. Patient not taking: Reported on 02/03/2024 04/26/23   Malvina Ellen, MD  iron  polysaccharides (NIFEREX) 150 MG capsule Take 1 capsule (150 mg total) by mouth daily. 09/22/23   Diona Perkins, MD  Lancet Devices (EASY MINI EJECT LANCING DEVICE) MISC daily. Patient not taking: Reported on 02/03/2024 11/27/20   [provider]  Lancets [facility] DELICA PLUS VARA) MISC USE   TO CHECK GLUCOSE ONCE DAILY Patient not taking: Reported on 02/03/2024 04/14/23   Malvina Ellen, MD  lidocaine  (LIDODERM ) 5 % Place 1 patch onto the skin daily. Remove & Discard patch within 12 hours or as directed by MD 12/01/23   Toma, Dimitry, MD  losartan  (COZAAR ) 50 MG tablet Take 1 tablet (50 mg total) by mouth at bedtime. 07/15/23   Theophilus Pagan, MD  metFORMIN  (GLUCOPHAGE -XR) 500 MG 24 hr tablet Take 2 tablets (1,000 mg total) by mouth daily with breakfast. Please be seen in clinic before next refill. 11/02/23    Diona Perkins, MD  methocarbamol  (ROBAXIN -750) 750 MG tablet Take 1 tablet (750 mg total) by mouth 3 (three) times daily as needed for muscle spasms. 01/19/24   Diona Perkins, MD  montelukast  (SINGULAIR ) 10 MG tablet Take 1 tablet (10 mg total) by mouth at bedtime. 07/28/23   Diona Perkins, MD  nitroGLYCERIN  (NITROSTAT ) 0.4 MG SL tablet Dissolve 1 tablet under tongue every 5 minutes, up to 3 doses for chest pain 09/22/23   Diona Perkins, MD  traMADol  (ULTRAM ) 50 MG tablet Take 1 tablet (50 mg total) by mouth every 12 (twelve) hours as needed for severe pain (pain score 7-10). 12/29/23   Cleotilde Lukes, DO  TRULICITY  0.75 MG/0.5ML SOPN Inject 0.75 mg subcutaneously once a week Patient taking differently: Take 0.75 mg by mouth once a week. 06/24/23   Malvina Ellen, MD  varenicline  (CHANTIX  CONTINUING MONTH PAK) 1 MG tablet Take 1 tablet (1 mg total) by mouth 2 (two) times daily. 01/31/24   Diona Perkins, MD  Past Surgical History Past Surgical History:  Procedure Laterality Date   ABDOMINAL AORTOGRAM W/LOWER EXTREMITY Bilateral 08/12/2020   Procedure: ABDOMINAL AORTOGRAM W/BILATERAL LOWER EXTREMITY RUNOFF;  Surgeon: Serene Gaile ORN, MD;  Location: [facility] INVASIVE CV LAB;  Service: Cardiovascular;  Laterality: Bilateral;   ABI  02/2012   ABI <0.65 BL 02/2012   BRAIN MENINGIOMA EXCISION     BRAIN TUMOR EXCISION     Bypass grafting of RLE for PAD claudication      CARDIAC CATHETERIZATION  04/09/208   CESAREAN SECTION     1974, 78 ,80   CHOLECYSTECTOMY, LAPAROSCOPIC     COLONOSCOPY  2014   [place], [place]   ENDARTERECTOMY FEMORAL Right 10/03/2020   Procedure: RIGHT FEMORAL ENDARTERECTOMY WITH VEIN PATCH ANGIOPLASTY;  Surgeon: Serene Gaile ORN, MD;  Location: [facility] OR;  Service: Vascular;  Laterality: Right;   ESOPHAGEAL DILATION     FEMORAL-POPLITEAL BYPASS GRAFT Right 10/03/2020   Procedure:  RIGHT FEMORAL- BELOW KNEE POPLITEAL BYPASS USING GORE PROPATEN GRAFT;  Surgeon: Serene Gaile ORN, MD;  Location: [facility] OR;  Service: Vascular;  Laterality: Right;   PATCH ANGIOPLASTY  10/03/2020   Procedure: VEIN PATCH ANGIOPLASTY;  Surgeon: Serene Gaile ORN, MD;  Location: [facility] OR;  Service: Vascular;;   Family History Family History  Problem Relation Age of Onset   Heart disease Mother    Asthma Mother    Cancer Mother        uterine    Depression Mother    Heart attack Mother 57   Hyperlipidemia Mother    Hypertension Mother    Stroke Mother    Kidney disease Mother    Heart attack Sister    Stroke Sister    Depression Sister    Diabetes Sister    Hyperlipidemia Sister    Depression Sister    Diabetes Sister    Hyperlipidemia Sister    HIV/AIDS Sister    Diabetes Brother    Asthma Brother    Hypertension Brother    Cancer Maternal Aunt        lung   Heart disease Maternal Grandmother    Heart attack Maternal Grandmother    Cancer Brother        colon   HIV/AIDS Brother    Colon cancer Neg Hx     Social History Social History   Tobacco Use   Smoking status: Every Day    Current packs/day: 0.25    Average packs/day: 0.3 packs/day for 48.1 years (12.0 ttl pk-yrs)    Types: Cigarettes    Start date: 01/01/1976    Passive exposure: Current   Smokeless tobacco: Never   Tobacco comments:    ~ 3 cigarettes / day  Vaping Use   Vaping status: Never Used  Substance Use Topics   Alcohol  use: Not Currently   Drug use: Not Currently   Allergies Gazyva  [obinutuzumab ], Aspirin , No healthtouch food allergies, Aspirin , and Baclofen   Review of Systems Review of Systems  Constitutional:  Positive for chills. Negative for fever.  HENT:  Positive for congestion.   Respiratory:  Positive for cough, chest tightness and shortness of breath.   Cardiovascular:  Negative for chest pain and palpitations.  Gastrointestinal:  Negative for abdominal pain and nausea.  Genitourinary:   Negative for dysuria.  Musculoskeletal:  Positive for arthralgias.  Neurological:  Negative for light-headedness.  All other systems reviewed and are negative.   Physical Exam Vital Signs  I have reviewed the triage vital signs BP ROLLEN)  152/73   Pulse 94   Temp 98.1 F (36.7 C) (Axillary)   Resp (!) 30   Ht 5' 2 (1.575 m)   Wt 61.2 kg   SpO2 97%   BMI 24.69 kg/m  Physical Exam Vitals and nursing note reviewed.  Constitutional:      Appearance: Normal appearance. She is well-developed. She is ill-appearing.  HENT:     Head: Normocephalic and atraumatic.     Right Ear: External ear normal.     Left Ear: External ear normal.     Nose: Nose normal.     Mouth/Throat:     Mouth: Mucous membranes are moist.  Eyes:     General: No scleral icterus.       Right eye: No discharge.        Left eye: No discharge.  Cardiovascular:     Rate and Rhythm: Normal rate and regular rhythm.     Pulses: Normal pulses.     Heart sounds: Normal heart sounds.  Pulmonary:     Effort: Pulmonary effort is normal. No respiratory distress.     Breath sounds: No stridor. Decreased breath sounds present.     Comments: Coarse b/l Abdominal:     General: Abdomen is flat. There is no distension.     Palpations: Abdomen is soft.     Tenderness: There is no abdominal tenderness.  Musculoskeletal:     Cervical back: No rigidity.     Right lower leg: Edema present.     Left lower leg: Edema present.     Comments: Trace LE edema bl  Skin:    General: Skin is warm and dry.     Capillary Refill: Capillary refill takes less than 2 seconds.  Neurological:     Mental Status: She is alert and oriented to person, place, and time.     GCS: GCS eye subscore is 4. GCS verbal subscore is 5. GCS motor subscore is 6.  Psychiatric:        Mood and Affect: Mood normal.        Behavior: Behavior normal. Behavior is cooperative.     ED Results and Treatments Labs (all labs ordered are listed, but only  abnormal results are displayed) Labs Reviewed  BASIC METABOLIC PANEL - Abnormal; Notable for the following components:      Result Value   Potassium 3.4 (*)    CO2 20 (*)    Glucose, Bld 368 (*)    Creatinine, Ser 1.47 (*)    Calcium  8.7 (*)    GFR, Estimated 37 (*)    All other components within normal limits  CBC WITH DIFFERENTIAL/PLATELET - Abnormal; Notable for the following components:   WBC 16.5 (*)    RDW 17.8 (*)    Neutro Abs 10.3 (*)    Lymphs Abs 5.1 (*)    Abs Immature Granulocytes 0.18 (*)    All other components within normal limits  I-STAT VENOUS BLOOD GAS, ED - Abnormal; Notable for the following components:   pCO2, Ven 33.8 (*)    pO2, Ven 112 (*)    Acid-base deficit 3.0 (*)    Potassium 3.4 (*)    Calcium , Ion 1.12 (*)    All other components within normal limits  RESP PANEL BY RT-PCR (RSV, FLU A&B, COVID)  RVPGX2  BRAIN NATRIURETIC PEPTIDE  TROPONIN I (HIGH SENSITIVITY)  TROPONIN I (HIGH SENSITIVITY)  Radiology No results found.  Pertinent labs & imaging results that were available during my care of the patient were reviewed by me and considered in my medical decision making (see MDM for details).  Medications Ordered in ED Medications  sodium chloride  0.9 % bolus 1,000 mL (1,000 mLs Intravenous New Bag/Given 02/03/24 1600)  ipratropium-albuterol  (DUONEB) 0.5-2.5 (3) MG/3ML nebulizer solution 3 mL (3 mLs Nebulization Given 02/03/24 1554)                                                                                                                                     Procedures .Critical Care  Performed by: Elnor Jayson LABOR, DO Authorized by: Elnor Jayson LABOR, DO   Critical care provider statement:    Critical care time (minutes):  45   Critical care time was exclusive of:  Separately billable procedures and treating other patients    Critical care was necessary to treat or prevent imminent or life-threatening deterioration of the following conditions:  Respiratory failure   Critical care was time spent personally by me on the following activities:  Development of treatment plan with patient or surrogate, discussions with consultants, evaluation of patient's response to treatment, examination of patient, ordering and review of laboratory studies, ordering and review of radiographic studies, ordering and performing treatments and interventions, pulse oximetry, re-evaluation of patient's condition, review of old charts and obtaining history from patient or surrogate   Care discussed with: admitting provider     (including critical care time)  Medical Decision Making / ED Course    Medical Decision Making:    [name] [name] is a 79 y.o. female  with past medical history as below, significant for diastolic HF, COPD, CVA, PVD, HLD, HTN who presents to the ED with complaint of dib. The complaint involves an extensive differential diagnosis and also carries with it a high risk of complications and morbidity.  Serious etiology was considered. Ddx includes but is not limited to: In my evaluation of this patient's dyspnea my DDx includes, but is not limited to, pneumonia, pulmonary embolism, pneumothorax, pulmonary edema, metabolic acidosis, asthma, COPD, cardiac cause, anemia, anxiety, etc.    Complete initial physical exam performed, notably the patient was breathing more comfortably on BiPAP, tachypnea noted.  Afebrile.  Blood pressure stable.    Reviewed and confirmed nursing documentation for past medical history, family history, social history.  Vital signs reviewed.      Clinical Course as of 02/03/24 1638  Fri Feb 03, 2024  1539 WBC(!): 16.5 Acute  [SG]  1613 Creatinine(!): 1.47 Similar to prior  [SG]  1613 Continues to improve w/ bipap [SG]    Clinical Course User Index [SG] Elnor Jayson LABOR, DO    Brief  summary: 78 year old female history above including CHF, COPD here with difficulty breathing x 2 weeks, worsened acutely No home oxygen , is now on BiPAP Will check screening labs, x-ray, plan admission  Cardiac CT 10/24 with normal LV perfusion, no ischemia, EF 69% at rest, stress EF 75%. Normal diastolic parameters   Labs reviewed, WBC is elev, likely 2/2 respiratory distress. VBG stable. CXR without  large infiltrate or ptx  Resp distress likely 2/2 COPD, admit FMTS            Additional history obtained: -Additional history obtained from ems -External records from outside source obtained and reviewed including: Chart review including previous notes, labs, imaging, consultation notes including  Prior EKGs, primary care documentation, home medications Cardiac CT 10/24 with normal LV perfusion, no ischemia, EF 69% at rest, stress EF 75%. Normal diastolic parameters   Lab Tests: -I ordered, reviewed, and interpreted labs.   The pertinent results include:   Labs Reviewed  BASIC METABOLIC PANEL - Abnormal; Notable for the following components:      Result Value   Potassium 3.4 (*)    CO2 20 (*)    Glucose, Bld 368 (*)    Creatinine, Ser 1.47 (*)    Calcium  8.7 (*)    GFR, Estimated 37 (*)    All other components within normal limits  CBC WITH DIFFERENTIAL/PLATELET - Abnormal; Notable for the following components:   WBC 16.5 (*)    RDW 17.8 (*)    Neutro Abs 10.3 (*)    Lymphs Abs 5.1 (*)    Abs Immature Granulocytes 0.18 (*)    All other components within normal limits  I-STAT VENOUS BLOOD GAS, ED - Abnormal; Notable for the following components:   pCO2, Ven 33.8 (*)    pO2, Ven 112 (*)    Acid-base deficit 3.0 (*)    Potassium 3.4 (*)    Calcium , Ion 1.12 (*)    All other components within normal limits  RESP PANEL BY RT-PCR (RSV, FLU A&B, COVID)  RVPGX2  BRAIN NATRIURETIC PEPTIDE  TROPONIN I (HIGH SENSITIVITY)  TROPONIN I (HIGH SENSITIVITY)    Notable for  wbc +  EKG   EKG Interpretation Date/Time:  Friday February 03 2024 14:59:36 EST Ventricular Rate:  75 PR Interval:  139 QRS Duration:  91 QT Interval:  416 QTC Calculation: 465 R Axis:   71  Text Interpretation: Sinus rhythm Borderline repolarization abnormality Confirmed by Elnor Savant ([id number]) on 02/03/2024 3:10:16 PM         Imaging Studies ordered: I ordered imaging studies including CXR I independently visualized the following imaging with scope of interpretation limited to determining acute life threatening conditions related to emergency care; findings noted above I independently visualized and interpreted imaging. I agree with the radiologist interpretation   Medicines ordered and prescription drug management: Meds ordered this encounter  Medications   sodium chloride  0.9 % bolus 1,000 mL   ipratropium-albuterol  (DUONEB) 0.5-2.5 (3) MG/3ML nebulizer solution 3 mL    -I have reviewed the patients home medicines and have made adjustments as needed   Consultations Obtained: na   Cardiac Monitoring: The patient was maintained on a cardiac monitor.  I personally viewed and interpreted the cardiac monitored which showed an underlying rhythm of: sinus tachy Continuous pulse oximetry interpreted by myself, 97% on BIPAP.    Social Determinants of Health:  Diagnosis or treatment significantly limited by social determinants of health: current smoker Counseled patient for approximately 3 minutes regarding smoking cessation. Discussed risks of smoking and how they applied and affected their visit here today. Patient not ready to quit at this time, however will follow up with their primary doctor when they  are.   CPT code: 00593: intermediate counseling for smoking cessation     Reevaluation: After the interventions noted above, I reevaluated the patient and found that they have improved  Co morbidities that complicate the patient evaluation  Past Medical History:   Diagnosis Date   Allergy    environmental   Asthma    Brain tumor (HCC)    Chronic diastolic CHF (congestive heart failure) (HCC)    CKD (chronic kidney disease)    CLL (chronic lymphocytic leukemia) (HCC)    Constipation 11/15/2011   COPD (chronic obstructive pulmonary disease) (HCC)    CVA (cerebral vascular accident) (HCC)    Diabetes (HCC)    GERD (gastroesophageal reflux disease)    Headache    migraines   Hyperlipidemia    Hypertension    PVD (peripheral vascular disease) (HCC)    Stroke (HCC) 1998   in 1998 due to tumor-right side      Dispostion: Disposition decision including need for hospitalization was considered, and patient admitted to the hospital.    Final Clinical Impression(s) / ED Diagnoses Final diagnoses:  COPD exacerbation (HCC)  Respiratory distress        Elnor Jayson LABOR, DO 02/03/24 1638

## 2024-02-03 NOTE — ED Notes (Signed)
 CCMD

## 2024-02-03 NOTE — Plan of Care (Signed)
 FMTS Brief Progress Note  S: Patient seen on night rounds with Dr. Elicia. Patient reports feeling much better than on admission. Reports decreased WOB and SOB. Patient currently on BiPAP.    O: BP 127/65 (BP Location: Right Arm)   Pulse 69   Temp 98.4 F (36.9 C) (Oral)   Resp 17   Ht 5' 2 (1.575 m)   Wt 61.6 kg   SpO2 99%   BMI 24.84 kg/m   General: A&O, NAD HEENT: Wearing BiPAP with good seal  Cardiac: RRR Respiratory: On BiPAP, NWOB  Psych: Appropriate mood and affect   A/P: 78 yo F admitted for SOB likely in the setting of acute exacerbation of COPD in the setting of COPD and underlying asthma. - will have RT evaluate off BiPAP, wean O2 as tolerated - continue Duonebs q4h  - will order diet if patient off bipap - rest of plan per day team - Orders reviewed. Labs for AM ordered, which was adjusted as needed.  - If condition changes, plan includes reevaluation  Lonnie, Graeden Bitner, MD 02/03/2024, 8:04 PM PGY-1, [facility] Night Resident  Please page [phone] with questions.

## 2024-02-03 NOTE — Progress Notes (Signed)
 Patient removed from BiPap per MD request. Patient is breathing comfortably. Placed on 6L salter HFNC. Sats 100%, RR . RN aware

## 2024-02-03 NOTE — Assessment & Plan Note (Signed)
 High risk for severe infection, therefore will treat with Paxlovid . -Renally dose Paxlovid  -Supportive care

## 2024-02-03 NOTE — Hospital Course (Addendum)
 [name] [name] is a 78 y.o.female with a history of T2DM, HTN, tobacco use, asthma, CHF, CKDIIIb, CLL, CVA, brain tumor, GERD, HLD and PVD who was admitted to the [facility] Medicine Teaching Service at [facility] for COPD exacerbation. Her hospital course is detailed below:  COPD exacerbation likely 2/2 COVID Significant dyspnea and wheezing in the ED, placed on BiPAP, given DuoNebs, Solu-Medrol  and mag with some symptom improvement.  Likely triggered by underlying COVID, started on Paxlovid .  Weaned to room air on 2/8, but began having N/V, likely secondary to Paxlovid .  Plan to complete 5-day steroid course on 2/11.  T2DM Elevated inpatient likely in the setting of steroid use. Restarted on home metformin , Jardiance  and Trulicity  upon discharge.  Other chronic conditions were medically managed with home medications and formulary alternatives as necessary (PVD, HTN, HLD, neuropathy, CKD, CHF)  PCP Follow-up Recommendations: Consider triple therapy inhaler given significant COPD exacerbation. Monitor CBGs while completing steroid course. Titrate BP medications as needed.

## 2024-02-04 ENCOUNTER — Inpatient Hospital Stay (HOSPITAL_COMMUNITY): Payer: 59

## 2024-02-04 DIAGNOSIS — J441 Chronic obstructive pulmonary disease with (acute) exacerbation: Secondary | ICD-10-CM | POA: Diagnosis not present

## 2024-02-04 DIAGNOSIS — U071 COVID-19: Secondary | ICD-10-CM | POA: Diagnosis not present

## 2024-02-04 LAB — GLUCOSE, CAPILLARY
Glucose-Capillary: 202 mg/dL — ABNORMAL HIGH (ref 70–99)
Glucose-Capillary: 153 mg/dL — ABNORMAL HIGH (ref 70–99)
Glucose-Capillary: 262 mg/dL — ABNORMAL HIGH (ref 70–99)
Glucose-Capillary: 158 mg/dL — ABNORMAL HIGH (ref 70–99)

## 2024-02-04 LAB — CBC
HCT: 41.8 % (ref 36.0–46.0)
Hemoglobin: 12.8 g/dL (ref 12.0–15.0)
MCH: 27.9 pg (ref 26.0–34.0)
MCHC: 30.6 g/dL (ref 30.0–36.0)
MCV: 91.3 fL (ref 80.0–100.0)
Platelets: 206 10*3/uL (ref 150–400)
RBC: 4.58 MIL/uL (ref 3.87–5.11)
RDW: 17.6 % — ABNORMAL HIGH (ref 11.5–15.5)
WBC: 14.1 10*3/uL — ABNORMAL HIGH (ref 4.0–10.5)
nRBC: 0 % (ref 0.0–0.2)

## 2024-02-04 LAB — TROPONIN I (HIGH SENSITIVITY)
Troponin I (High Sensitivity): 8 ng/L (ref <18)
Troponin I (High Sensitivity): 7 ng/L (ref <18)

## 2024-02-04 LAB — BASIC METABOLIC PANEL
Anion gap: 8 (ref 5–15)
BUN: 13 mg/dL (ref 8–23)
CO2: 25 mmol/L (ref 22–32)
Calcium: 8.1 mg/dL — ABNORMAL LOW (ref 8.9–10.3)
Chloride: 110 mmol/L (ref 98–111)
Creatinine, Ser: 1.31 mg/dL — ABNORMAL HIGH (ref 0.44–1.00)
GFR, Estimated: 42 mL/min — ABNORMAL LOW (ref >60)
Glucose, Bld: 256 mg/dL — ABNORMAL HIGH (ref 70–99)
Potassium: 4.5 mmol/L (ref 3.5–5.1)
Sodium: 143 mmol/L (ref 135–145)

## 2024-02-04 MED ORDER — PREDNISONE 20 MG PO TABS: 40.0000 mg | ORAL_TABLET | Freq: Every day | ORAL | Status: DC

## 2024-02-04 MED ORDER — HYDROCODONE BIT-HOMATROP MBR 5-1.5 MG/5ML PO SOLN
5.0000 mL | Freq: Once | ORAL | Status: AC
  Administered 2024-02-04: 5 mL via ORAL
  Filled 2024-02-04: qty 5

## 2024-02-04 MED ORDER — INSULIN ASPART 100 UNIT/ML IJ SOLN
0.0000 [IU] | Freq: Three times a day (TID) | INTRAMUSCULAR | Status: DC
  Administered 2024-02-04: 4 [IU] via SUBCUTANEOUS
  Administered 2024-02-04 (×2): 7 [IU] via SUBCUTANEOUS
  Administered 2024-02-05: 4 [IU] via SUBCUTANEOUS
  Administered 2024-02-05 – 2024-02-06 (×3): 7 [IU] via SUBCUTANEOUS
  Administered 2024-02-06: 4 [IU] via SUBCUTANEOUS

## 2024-02-04 MED ORDER — INSULIN ASPART 100 UNIT/ML IJ SOLN
0.0000 [IU] | Freq: Every day | INTRAMUSCULAR | Status: DC
  Administered 2024-02-05: 3 [IU] via SUBCUTANEOUS

## 2024-02-04 MED ORDER — METHOCARBAMOL 500 MG PO TABS
750.0000 mg | ORAL_TABLET | Freq: Three times a day (TID) | ORAL | Status: DC | PRN
  Administered 2024-02-04 – 2024-02-05 (×3): 750 mg via ORAL
  Filled 2024-02-04 (×3): qty 2

## 2024-02-04 MED ORDER — ASPIRIN 325 MG PO TBEC
325.0000 mg | DELAYED_RELEASE_TABLET | Freq: Once | ORAL | Status: AC
  Administered 2024-02-04: 325 mg via ORAL
  Filled 2024-02-04: qty 1

## 2024-02-04 MED ORDER — LOSARTAN POTASSIUM 50 MG PO TABS
50.0000 mg | ORAL_TABLET | Freq: Every day | ORAL | Status: DC
  Administered 2024-02-04 – 2024-02-06 (×3): 50 mg via ORAL
  Filled 2024-02-04 (×3): qty 1

## 2024-02-04 MED ORDER — PREDNISONE 20 MG PO TABS
40.0000 mg | ORAL_TABLET | Freq: Every day | ORAL | Status: DC
  Administered 2024-02-04 – 2024-02-06 (×3): 40 mg via ORAL
  Filled 2024-02-04 (×3): qty 2

## 2024-02-04 MED ORDER — DM-GUAIFENESIN ER 30-600 MG PO TB12
1.0000 | ORAL_TABLET | Freq: Two times a day (BID) | ORAL | Status: DC | PRN
  Administered 2024-02-04 – 2024-02-06 (×3): 1 via ORAL
  Filled 2024-02-04 (×3): qty 1

## 2024-02-04 MED ORDER — ACETAMINOPHEN 325 MG PO TABS
650.0000 mg | ORAL_TABLET | Freq: Four times a day (QID) | ORAL | Status: DC | PRN
  Administered 2024-02-04: 650 mg via ORAL
  Filled 2024-02-04: qty 2

## 2024-02-04 NOTE — Plan of Care (Addendum)
 Notified by nursing that patient was having sharp and radiating chest pain that was 8/10.  Went to see patient.  She endorsed having sharp chest pain that was radiating to her left shoulder.  Cough was present at the time.  Patient was tearful and in pain and was having muscle spasms as well.   A/P: ACS rule out - EKG - Troponins - ASA 325 mg   Addendum 1700: EKG NSR with regular rate. No ST elevation noted.  Troponins trended flat. Patient was hesitant to take the aspirin  due to having allergy, which she reports is abdominal pain.  However after discussion with nursing she was amenable to taking it.  Chest pain improved.  Addendum 1715: Notified that patient had a coughing fit and likely aspirated her food. Went to bedside.  - CXR ordered - Hycodan 5 mL once - NPO  Abbygail Willhoite, DO [facility], PGY-1 02/04/24 5:45 PM  Service pager [phone]

## 2024-02-04 NOTE — Evaluation (Signed)
 Physical Therapy Evaluation Patient Details Name: [name] [name] MRN: [id number] DOB: [birth year] Today's Date: 02/04/2024  History of Present Illness  Pt is 78 yo pt presenting on 02/02/23 to [facility] ED with exertional dyspnea and feeling unwell for the past 2 weeks gradually getting worse. Pt placed on BiPAP on 2/7 then discontinued on 2/7 placed on HHF. PMH: Diastolic HF, COPD, CVA, PVD, HLD, HTN.  Clinical Impression  Pt is presenting slightly below baseline level of functioning. Currently pt is CGA for sit to stand and gait with RW and assist with O2 lines. O2 sats remained above 88% on 1 L o2 via [place]. Spouse present and supportive throughout session. Due to pt current functional status, home set up and available assistance at home no recommended skilled physical therapy services at this time on discharge from acute care hospital setting. Will continue to follow in acute setting in order to ensure that pt returns home with decreased risk for falls, injury, re-hospitalization and improved activity tolerance.        If plan is discharge home, recommend the following: Help with stairs or ramp for entrance;Assistance with cooking/housework;Assist for transportation     Equipment Recommendations Rollator (4 wheels)     Functional Status Assessment Patient has had a recent decline in their functional status and demonstrates the ability to make significant improvements in function in a reasonable and predictable amount of time.     Precautions / Restrictions Precautions Precautions: Fall Restrictions Weight Bearing Restrictions Per Provider Order: No      Mobility  Bed Mobility Overal bed mobility: Modified Independent    Transfers Overall transfer level: Needs assistance Equipment used: Rolling walker (2 wheels) Transfers: Sit to/from Stand Sit to Stand: Contact guard assist    Ambulation/Gait Ambulation/Gait assistance: Contact guard assist Gait Distance (Feet): 20 Feet Assistive  device: Rolling walker (2 wheels) Gait Pattern/deviations: Step-through pattern, Decreased stride length Gait velocity: decreased Gait velocity interpretation: <1.31 ft/sec, indicative of household ambulator   General Gait Details: O2 sats remained above 88% during gait on 1L o2 via [place]      Balance Overall balance assessment: Mild deficits observed, not formally tested       Pertinent Vitals/Pain Pain Assessment Pain Assessment: No/denies pain    Home Living Family/patient expects to be discharged to:: Private residence Living Arrangements: Spouse/significant other Available Help at Discharge: Family;Available 24 hours/day Type of Home: House Home Access: Stairs to enter Entrance Stairs-Rails: None Entrance Stairs-Number of Steps: 3 in front and back, (bil rails in back)   Home Layout: One level Home Equipment: Educational Psychologist (2 wheels)      Prior Function Prior Level of Function : Independent/Modified Independent             Mobility Comments: reports Mod I with use of rollator ADLs Comments: reports she has difficulty washing her R leg. Other than that she is mod I with all ADL's.     Extremity/Trunk Assessment   Upper Extremity Assessment Upper Extremity Assessment: Defer to OT evaluation    Lower Extremity Assessment Lower Extremity Assessment: Overall WFL for tasks assessed    Cervical / Trunk Assessment Cervical / Trunk Assessment: Normal  Communication   Communication Communication: No apparent difficulties  Cognition Arousal: Alert Behavior During Therapy: WFL for tasks assessed/performed Overall Cognitive Status: Within Functional Limits for tasks assessed              General Comments General comments (skin integrity, edema, etc.): spouse present throughout session. Pt  HR and O2 sats WNL throughout session on 1L O2 via [place].        Assessment/Plan    PT Assessment Patient needs continued PT services  PT Problem List  Decreased activity tolerance;Decreased mobility       PT Treatment Interventions DME instruction;Therapeutic activities;Gait training;Therapeutic exercise;Stair training;Functional mobility training;Balance training;Patient/family education    PT Goals (Current goals can be found in the Care Plan section)  Acute Rehab PT Goals Patient Stated Goal: to return home with spouse PT Goal Formulation: With patient Time For Goal Achievement: 02/18/24 Potential to Achieve Goals: Good    Frequency Min 1X/week        AM-PAC PT 6 Clicks Mobility  Outcome Measure Help needed turning from your back to your side while in a flat bed without using bedrails?: None Help needed moving from lying on your back to sitting on the side of a flat bed without using bedrails?: None Help needed moving to and from a bed to a chair (including a wheelchair)?: A Little Help needed standing up from a chair using your arms (e.g., wheelchair or bedside chair)?: A Little Help needed to walk in hospital room?: A Little Help needed climbing 3-5 steps with a railing? : A Little 6 Click Score: 20    End of Session Equipment Utilized During Treatment: Gait belt;Oxygen  Activity Tolerance: Patient tolerated treatment well Patient left: in chair;with call bell/phone within reach;with family/visitor present;with chair alarm set Nurse Communication: Mobility status PT Visit Diagnosis: Other abnormalities of gait and mobility (R26.89)    Time: 1000-1031 PT Time Calculation (min) (ACUTE ONLY): 31 min   Charges:   PT Evaluation $PT Eval Low Complexity: 1 Low PT Treatments $Therapeutic Activity: 8-22 mins PT General Charges $$ ACUTE PT VISIT: 1 Visit        Dorothyann Maier, DPT, CLT  Acute Rehabilitation Services Office: [phone] (Secure chat preferred)   Dorothyann VEAR Maier 02/04/2024, 3:32 PM

## 2024-02-04 NOTE — Assessment & Plan Note (Signed)
 RVP positive for COVID.  High risk for severe infection, therefore will treat with Paxlovid . - Renally dose Paxlovid  - Supportive care - Wean O2 as able

## 2024-02-04 NOTE — Progress Notes (Signed)
 Daily Progress Note Intern Pager: [phone]  Patient name: [name] [name] Medical record number: [id number] Date of birth: [birth year] Age: 78 y.o. Gender: female  Primary Care Provider: Diona Perkins, MD Consultants: None Code Status: Full Code  Pt Overview and Major Events to Date:  2/7: Admitted  Assessment and Plan: Nawaal Alling is a 78 yo F presenting with dyspnea and wheezing and was admitted for acute COPD exacerbation in the setting of COVID.  Assessment & Plan COPD exacerbation (HCC) Dyspneic with labored breathing on BiPAP but likely secondary to BiPAP settings given patient appears to be breathing over the machine, was weaned to [facility] which she is saturating at 96% with.  Significant productive cough likely exacerbating dyspnea, which has improved today and will manage symptomatically.  Likely viral trigger as below, low concern for secondary bacterial process therefore no need for abx. - DuoNebs TID and q2h PRN - Prednisone  40 mg x 4 days (2/8-2/11) - Wean O2 as tolerated to RA - Continue Singulair  10 mg daily - Breo Ellipta  100-25 mcg/act q puff daily COVID RVP positive for COVID.  High risk for severe infection, therefore will treat with Paxlovid . - Renally dose Paxlovid  - Supportive care - Wean O2 as able  Chronic and Stable Problems:  T2DM: A1c 2 months ago - 6.9. Home medications include Jardiance  25 mg daily, Metformin  1000 mg daily, and Trulicity  0.75 mg/0.64ml weekly, currently holding.  Monitoring with CBGs. HLD: Continue Lipitor  80 mg daily HTN: Home regimen includes amlodipine  10 mg  and Losartan  50 mg daily.  Will restart losartan  50 mg daily. PVD: Clots in R leg diagnosed in 2008 and was started on Plavix  which she continues to take s/p stents.  Diabetic Neuropathy: Continue gabapentin  600 mg daily and Cymbalta  60 mg daily CHF: Home regimen includes Lasix  20 mg daily and, Jardiance  25 mg daily. CKD3b: Cr 1.31, baseline ~1.4 Asthma: On Albuterol  PRN,    FEN/GI: Carb Modified PPx: Lovenox  Dispo: Home pending clinical improvement and O2 requirement.  Subjective:  Patient is doing better this morning.  She is speaking in full sentences without being dyspneic.  No coughing noted while in the room.  She endorses that her sputum production has decreased this morning.  She was sharing about her family's losses recently.  Otherwise seems to be doing well and is currently in no distress.  Denies using oxygen  at home.  Objective: Temp:  [97.6 F (36.4 C)-98.4 F (36.9 C)] 97.6 F (36.4 C) (02/08 0737) Pulse Rate:  [60-94] 60 (02/08 0400) Resp:  [15-39] 15 (02/08 0400) BP: (105-172)/(57-81) 143/80 (02/08 0800) SpO2:  [95 %-100 %] 95 % (02/08 0857) FiO2 (%):  [50 %] 50 % (02/07 1939) Weight:  [61.2 kg-61.6 kg] 61.6 kg (02/07 1800) Physical Exam: General: Awake and alert in NAD. Cardiovascular: RRR. No M/R/G. Respiratory: Mild end expiratory wheezes.  Normal WOB on 1 L [place].  Saturating in the mid to upper 90s. Abdomen: Soft, non-tender, non-distended. Normoactive bowel sounds. Extremities: No BLE edema.   Laboratory: Most recent CBC Lab Results  Component Value Date   WBC 14.1 (H) 02/04/2024   HGB 12.8 02/04/2024   HCT 41.8 02/04/2024   MCV 91.3 02/04/2024   PLT 206 02/04/2024   Most recent BMP    Latest Ref Rng & Units 02/04/2024    2:12 AM  BMP  Glucose 70 - 99 mg/dL 743   BUN 8 - 23 mg/dL 13   Creatinine 9.55 - 1.00 mg/dL 8.68  Sodium 135 - 145 mmol/L 143   Potassium 3.5 - 5.1 mmol/L 4.5   Chloride 98 - 111 mmol/L 110   CO2 22 - 32 mmol/L 25   Calcium  8.9 - 10.3 mg/dL 8.1    Imaging/Diagnostic Tests: No new imaging.   Janna Ferrier, DO 02/04/2024, 9:24 AM  PGY-1, [facility] Family Medicine FPTS Intern pager: [phone], text pages welcome Secure chat group [facility] Teaching Service

## 2024-02-04 NOTE — Progress Notes (Signed)
 Patient started to have a coughing spell while eating her dinner, which caused her to aspirate her food. Suction was set up and suctioned out as much food as possible. Patient maintained her oxygen  saturation up in the 93-95% on 1LNC. Patient received a PRN breathing treatment and notified Attending to come to bedside. Patient also having severe pleuritic chest pain during coughing spell. Got orders for STAT Chest xray and Hycodan. Patient was made NPO for the time being. Will continue to monitor patient.

## 2024-02-04 NOTE — Plan of Care (Addendum)
 FMTS Brief Progress Note  S:Went bedside to see patient. Patient doing well, feels breathing is improved.    O: BP (!) 148/71 (BP Location: Right Arm)   Pulse 73   Temp 98.2 F (36.8 C) (Oral)   Resp 17   Ht 5' 2 (1.575 m)   Wt 61.6 kg   SpO2 93%   BMI 24.84 kg/m   General: NAD, conversant, good mood Card: S1/S2, RRR, NRMG Resp: CTABL, good WOB Ext: Moving all extremities, no edema  A/P: COPD exacerbation Patient here for COPD/Asthma exacerbation, had coughing fit earlier in day, that lead to NPO order. Patient passed bedside swallow eval w/ RN, while provider in room, and now okay to have diet.  - Plans per day team - Carb modified diet  - Orders reviewed. Labs for AM ordered, which was adjusted as needed.   Jennelle Riis, MD 02/04/2024, 8:53 PM PGY-3,  Family Medicine Night Resident  Please page [phone] with questions.

## 2024-02-04 NOTE — Assessment & Plan Note (Addendum)
 Dyspneic with labored breathing on BiPAP but likely secondary to BiPAP settings given patient appears to be breathing over the machine, was weaned to [facility] which she is saturating at 96% with.  Significant productive cough likely exacerbating dyspnea, which has improved today and will manage symptomatically.  Likely viral trigger as below, low concern for secondary bacterial process therefore no need for abx. - DuoNebs TID and q2h PRN - Prednisone  40 mg x 4 days (2/8-2/11) - Wean O2 as tolerated to RA - Continue Singulair  10 mg daily - Breo Ellipta  100-25 mcg/act q puff daily

## 2024-02-04 NOTE — Progress Notes (Signed)
 OT Cancellation Note  Patient Details Name: [name] [name] MRN: [id number] DOB: [birth year]   Cancelled Treatment:    Reason Eval/Treat Not Completed: Other (comment) (Per nursing to hold as was up in chair then started to have chest pain.) Warrick POUR OTR/L  Acute Rehab Services  [phone] office number   Warrick Berber 02/04/2024, 2:39 PM

## 2024-02-04 NOTE — Progress Notes (Signed)
 Patient stating have 8/10 sharp chest pain and Leg muscle spasms. She said it stared all of a sudden. Paged, Attending and I got orders for EKG and troponin checks. Resident ordered acetaminophen  and Robaxin  for patient. Resident at beside, reviewing EKG and assessing patient. Will continue to monitor patient.

## 2024-02-05 DIAGNOSIS — R112 Nausea with vomiting, unspecified: Secondary | ICD-10-CM

## 2024-02-05 DIAGNOSIS — U071 COVID-19: Secondary | ICD-10-CM | POA: Diagnosis not present

## 2024-02-05 DIAGNOSIS — Z794 Long term (current) use of insulin: Secondary | ICD-10-CM

## 2024-02-05 DIAGNOSIS — E1169 Type 2 diabetes mellitus with other specified complication: Secondary | ICD-10-CM

## 2024-02-05 DIAGNOSIS — J441 Chronic obstructive pulmonary disease with (acute) exacerbation: Secondary | ICD-10-CM | POA: Diagnosis not present

## 2024-02-05 DIAGNOSIS — E119 Type 2 diabetes mellitus without complications: Secondary | ICD-10-CM

## 2024-02-05 LAB — GLUCOSE, CAPILLARY
Glucose-Capillary: 190 mg/dL — ABNORMAL HIGH (ref 70–99)
Glucose-Capillary: 242 mg/dL — ABNORMAL HIGH (ref 70–99)
Glucose-Capillary: 222 mg/dL — ABNORMAL HIGH (ref 70–99)
Glucose-Capillary: 267 mg/dL — ABNORMAL HIGH (ref 70–99)

## 2024-02-05 LAB — CBC
HCT: 39.8 % (ref 36.0–46.0)
Hemoglobin: 12.5 g/dL (ref 12.0–15.0)
MCH: 28 pg (ref 26.0–34.0)
MCHC: 31.4 g/dL (ref 30.0–36.0)
MCV: 89.2 fL (ref 80.0–100.0)
Platelets: 208 10*3/uL (ref 150–400)
RBC: 4.46 MIL/uL (ref 3.87–5.11)
RDW: 17.3 % — ABNORMAL HIGH (ref 11.5–15.5)
WBC: 16.9 10*3/uL — ABNORMAL HIGH (ref 4.0–10.5)
nRBC: 0 % (ref 0.0–0.2)

## 2024-02-05 LAB — BASIC METABOLIC PANEL
Anion gap: 10 (ref 5–15)
BUN: 18 mg/dL (ref 8–23)
CO2: 23 mmol/L (ref 22–32)
Calcium: 8.2 mg/dL — ABNORMAL LOW (ref 8.9–10.3)
Chloride: 106 mmol/L (ref 98–111)
Creatinine, Ser: 1.29 mg/dL — ABNORMAL HIGH (ref 0.44–1.00)
GFR, Estimated: 43 mL/min — ABNORMAL LOW (ref >60)
Glucose, Bld: 261 mg/dL — ABNORMAL HIGH (ref 70–99)
Potassium: 4 mmol/L (ref 3.5–5.1)
Sodium: 139 mmol/L (ref 135–145)

## 2024-02-05 MED ORDER — METHOCARBAMOL 500 MG PO TABS
1000.0000 mg | ORAL_TABLET | Freq: Three times a day (TID) | ORAL | Status: DC | PRN
  Administered 2024-02-06: 1000 mg via ORAL
  Filled 2024-02-05: qty 2

## 2024-02-05 MED ORDER — AMLODIPINE BESYLATE 10 MG PO TABS
10.0000 mg | ORAL_TABLET | Freq: Every day | ORAL | Status: DC
  Administered 2024-02-05 – 2024-02-06 (×2): 10 mg via ORAL
  Filled 2024-02-05 (×2): qty 1

## 2024-02-05 MED ORDER — FUROSEMIDE 20 MG PO TABS
20.0000 mg | ORAL_TABLET | Freq: Every day | ORAL | Status: DC
  Administered 2024-02-05 – 2024-02-06 (×2): 20 mg via ORAL
  Filled 2024-02-05 (×2): qty 1

## 2024-02-05 MED ORDER — ONDANSETRON HCL 4 MG/2ML IJ SOLN
4.0000 mg | Freq: Three times a day (TID) | INTRAMUSCULAR | Status: DC | PRN
  Administered 2024-02-05: 4 mg via INTRAVENOUS
  Filled 2024-02-05 (×2): qty 2

## 2024-02-05 MED ORDER — IPRATROPIUM-ALBUTEROL 0.5-2.5 (3) MG/3ML IN SOLN: 3.0000 mL | RESPIRATORY_TRACT | Status: DC | PRN

## 2024-02-05 NOTE — Progress Notes (Signed)
 Daily Progress Note Intern Pager: [phone]  Patient name: [name] [name] Medical record number: [id number] Date of birth: 1946/07/09 Age: 78 y.o. Gender: female  Primary Care Provider: Diona Perkins, MD Consultants: None Code Status: Full code  Pt Overview and Major Events to Date:  2/7 Admitted to FMTS  Assessment and Plan: CHERLY ERNO is a 78 y.o. female presenting with shortness of breath and wheezing in the setting of COPD exacerbation secondary to COVID. PMHx includes T2DM, HTN, tobacco use, asthma, CHF, CKDIIIb, CLL, CVA, brain tumor, GERD, HLD and PVD.  Assessment & Plan COPD exacerbation (HCC) Now weaned to room air, stable respiratory status and reassuring lung exam today.  Will ambulate with pulse ox prior to discharge but anticipate she can go home without oxygen  support. - DuoNebs TID - Ambulate with pulse ox prior to discharge - Prednisone  40 mg x 4 days (2/8-2/11) - Wean O2 as tolerated to RA - Continue home Singulair  10 mg daily - Continue home Breo inhaler COVID - Renally dose Paxlovid  - Supportive care T2DM (type 2 diabetes mellitus) (HCC) Persistently elevated likely in the setting of steroids. Hold home Metformin  1000 mg daily, Trulicity  and Jardiance  25 mg daily. -Added rSSI   Chronic and Stable Problems: PVD: Continue home Plavix  75 mg daily HTN: Normotensive.  Continue home Losartan  50 mg daily. Add back Amlodipine  10 mg daily HLD: Continue home Atorvastatin  80 mg daily Neuropathy: Continue Gabapentin  600mg  daily and Duloxetine  60 mg daily CKDIIIb: Creatinine 1.29, baseline ~1.4 CHF: Add back home Furosemide  20mg  daily   FEN/GI: Carb modified PPx: Lovenox  Dispo: Home pending respiratory improvement  Subjective:  Assessed at bedside, reports 2 episodes of vomiting this a.m. but reports could have been secondary to the sweetener in her coffee or something she ate.  Denies nausea or abdominal pain at this time.  Denies further episodes of  diarrhea.  Reports intermittent cough that is improving.  Objective: Temp:  [97.6 F (36.4 C)-98.4 F (36.9 C)] 98.1 F (36.7 C) (02/09 0322) Pulse Rate:  [56-73] 56 (02/09 0322) Resp:  [13-18] 13 (02/09 0322) BP: (136-172)/(66-84) 148/69 (02/09 0322) SpO2:  [93 %-96 %] 94 % (02/09 0322) Physical Exam: General: Sitting up in bed, NAD. Cardiovascular: RRR without murmur Respiratory: Some rhonchorous breath sounds diffusely, mild expiratory wheezing in RUL.  Normal work of breathing on room air. Abdomen: Soft, nondistended.  Mildly tender periumbilical region. Extremities: Trace peripheral edema bilaterally  Laboratory: Most recent CBC Lab Results  Component Value Date   WBC 16.9 (H) 02/05/2024   HGB 12.5 02/05/2024   HCT 39.8 02/05/2024   MCV 89.2 02/05/2024   PLT 208 02/05/2024   Most recent BMP    Latest Ref Rng & Units 02/05/2024    2:28 AM  BMP  Glucose 70 - 99 mg/dL 738   BUN 8 - 23 mg/dL 18   Creatinine 9.55 - 1.00 mg/dL 8.70   Sodium 864 - 854 mmol/L 139   Potassium 3.5 - 5.1 mmol/L 4.0   Chloride 98 - 111 mmol/L 106   CO2 22 - 32 mmol/L 23   Calcium  8.9 - 10.3 mg/dL 8.2     Other pertinent labs: Glucose 261 > 190  Imaging/Diagnostic Tests: DG CHEST PORT 1 VIEW Result Date: 02/04/2024 IMPRESSION: No active disease.    Theophilus Pagan, MD 02/05/2024, 7:23 AM  PGY-2, [facility] Family Medicine FPTS Intern pager: [phone], text pages welcome Secure chat group [facility] Teaching Service

## 2024-02-05 NOTE — Progress Notes (Signed)
SATURATION QUALIFICATIONS: (This note is used to comply with regulatory documentation for home oxygen)  Patient Saturations on Room Air at Rest = 91%  Patient Saturations on Room Air while Ambulating = 93%  

## 2024-02-05 NOTE — Assessment & Plan Note (Addendum)
 Now weaned to room air, stable respiratory status and reassuring lung exam today.  Will ambulate with pulse ox prior to discharge but anticipate she can go home without oxygen  support. - DuoNebs TID - Ambulate with pulse ox prior to discharge - Prednisone  40 mg x 4 days (2/8-2/11) - Wean O2 as tolerated to RA - Continue home Singulair  10 mg daily - Continue home Breo inhaler

## 2024-02-05 NOTE — Assessment & Plan Note (Signed)
-   Renally dose Paxlovid  - Supportive care

## 2024-02-05 NOTE — Evaluation (Signed)
 Occupational Therapy Evaluation Patient Details Name: [name] [name] MRN: [id number] DOB: [birth year] Today's Date: 02/05/2024   History of Present Illness Pt is a 78 year old woman admitted on 02/03/24 with COPD and asthma exacerbation in the setting of COVID. PMH: CHF, CVA, PVD, HLD, HTN, current smoker.   Clinical Impression   Pt walks with a rollator (reports its brakes are broken), stands to shower and reports difficulty reaching her R foot to wash it, don her sock or cut her toenails. Pt is not interested in sock aid and does not think she needs a reacher. Recommended long handled bath sponge. Pt reports her shower is too small to accommodate a tub seat to help her conserve energy. Pt presents with decreased activity tolerance. She is overall functioning at a CGA level in OOB mobility and up to min assist for ADLs. Will follow acutely, do not anticipate pt will require post acute OT.      If plan is discharge home, recommend the following: A little help with bathing/dressing/bathroom;Assistance with cooking/housework;Assist for transportation;Help with stairs or ramp for entrance    Functional Status Assessment  Patient has had a recent decline in their functional status and demonstrates the ability to make significant improvements in function in a reasonable and predictable amount of time.  Equipment Recommendations   (rollator (only if covered by insurance))    Recommendations for Other Services       Precautions / Restrictions Precautions Precautions: Fall Restrictions Weight Bearing Restrictions Per Provider Order: No      Mobility Bed Mobility Overal bed mobility: Modified Independent             General bed mobility comments: HOB up    Transfers Overall transfer level: Needs assistance Equipment used: Rolling walker (2 wheels) Transfers: Sit to/from Stand Sit to Stand: Contact guard assist                  Balance Overall balance assessment: Mild  deficits observed, not formally tested                                         ADL either performed or assessed with clinical judgement   ADL Overall ADL's : Needs assistance/impaired Eating/Feeding: Independent;Bed level   Grooming: Oral care;Sitting   Upper Body Bathing: Minimal assistance;Standing   Lower Body Bathing: Minimal assistance;Sit to/from stand   Upper Body Dressing : Set up;Sitting   Lower Body Dressing: Minimal assistance;Sitting/lateral leans;Sit to/from stand   Toilet Transfer: Contact guard assist;Ambulation;Rolling walker (2 wheels)   Toileting- Clothing Manipulation and Hygiene: Modified independent;Sitting/lateral lean               Vision Baseline Vision/History: 1 Wears glasses Ability to See in Adequate Light: 0 Adequate Patient Visual Report: No change from baseline       Perception         Praxis         Pertinent Vitals/Pain Pain Assessment Pain Assessment: No/denies pain     Extremity/Trunk Assessment Upper Extremity Assessment Upper Extremity Assessment: Overall WFL for tasks assessed   Lower Extremity Assessment Lower Extremity Assessment: Defer to PT evaluation   Cervical / Trunk Assessment Cervical / Trunk Assessment: Normal   Communication Communication Communication: No apparent difficulties   Cognition Arousal: Alert Behavior During Therapy: WFL for tasks assessed/performed Overall Cognitive Status: Within Functional Limits for tasks assessed  General Comments       Exercises     Shoulder Instructions      Home Living Family/patient expects to be discharged to:: Private residence Living Arrangements: Spouse/significant other;Children Available Help at Discharge: Family;Available 24 hours/day Type of Home: House Home Access: Stairs to enter [employer] of Steps: 3 in front and back, (bil rails in back) Entrance  Stairs-Rails: None Home Layout: One level     Bathroom Shower/Tub: Producer, Television/film/video: Standard     Home Equipment: Educational Psychologist (2 wheels)   Additional Comments: rollator has broken brakes      Prior Functioning/Environment Prior Level of Function : Independent/Modified Independent             Mobility Comments: reports Mod I with use of rollator ADLs Comments: reports she has difficulty washing her R leg and donning sock. Other than that she is mod I with all ADL's.        OT Problem List: Decreased activity tolerance;Impaired balance (sitting and/or standing)      OT Treatment/Interventions: Self-care/ADL training;Patient/family education;Balance training;Therapeutic activities    OT Goals(Current goals can be found in the care plan section) Acute Rehab OT Goals OT Goal Formulation: With patient Time For Goal Achievement: 02/19/24 Potential to Achieve Goals: Good ADL Goals Pt Will Perform Grooming: with modified independence;standing Pt Will Perform Lower Body Bathing: sit to/from stand;with set-up Pt Will Perform Lower Body Dressing: with set-up;sit to/from stand Pt Will Transfer to Toilet: with modified independence;ambulating Pt Will Perform Toileting - Clothing Manipulation and hygiene: with modified independence;sit to/from stand Additional ADL Goal #1: Pt will state at least 3 energy conservation strategies as instructed.  OT Frequency: Min 1X/week    Co-evaluation              AM-PAC OT 6 Clicks Daily Activity     Outcome Measure Help from another person eating meals?: None Help from another person taking care of personal grooming?: A Little Help from another person toileting, which includes using toliet, bedpan, or urinal?: A Little Help from another person bathing (including washing, rinsing, drying)?: A Little Help from another person to put on and taking off regular upper body clothing?: A Little Help from  another person to put on and taking off regular lower body clothing?: A Little 6 Click Score: 19   End of Session Equipment Utilized During Treatment: Rolling walker (2 wheels)  Activity Tolerance: Other (comment) (pt with N/V just prior to OTs arrival, limited activity) Patient left: in bed;with call bell/phone within reach  OT Visit Diagnosis: Other abnormalities of gait and mobility (R26.89);Unsteadiness on feet (R26.81);Other (comment) (decreased activity tolerance)                Time: 9151-9086 OT Time Calculation (min): 25 min Charges:  OT General Charges $OT Visit: 1 Visit OT Evaluation $OT Eval Moderate Complexity: 1 Mod OT Treatments $Self Care/Home Management : 8-22 mins  [name] [name], OTR/L Acute Rehabilitation Services Office: [phone]   Kennth [name] Helling 02/05/2024, 9:22 AM

## 2024-02-05 NOTE — Assessment & Plan Note (Signed)
 Persistently elevated likely in the setting of steroids. Hold home Metformin  1000 mg daily, Trulicity  and Jardiance  25 mg daily. -Added rSSI

## 2024-02-05 NOTE — Discharge Instructions (Addendum)
 Dear [name] [name] Brakeman,   Thank you for letting us  participate in your care! In this section, you will find a brief hospital admission summary of why you were admitted to the hospital, what happened during your admission, your diagnosis/diagnoses, and recommended follow up.   You were diagnosed with a COPD exacerbation and tested positive for COVID.  You are treated with steroids, breathing treatments and antivirals to improve your symptoms.  Please complete your course of steroids (on 2/11) and Paxlovid  and follow-up with your PCP as scheduled on 02/09/2024.   POST-HOSPITAL & CARE INSTRUCTIONS We recommend following up with your PCP within 1 week from being discharged from the hospital. Please let PCP/Specialists know of any changes in medications that were made which you will be able to see in the medications section of this packet  DOCTOR'S APPOINTMENTS & FOLLOW UP Future Appointments  Date Time Provider Department Center  02/09/2024 11:25 AM Diona Perkins, MD [facility]  02/13/2024  9:30 AM [facility] LAB [facility] None  02/13/2024 10:00 AM Onesimo Emaline Brink, MD [facility] None  02/17/2024  9:30 AM Amalia Maude MATSU, RPH-CPP [facility]  02/17/2024 10:05 AM Diona Perkins, MD [facility]  03/21/2024 10:00 AM Abran Norleen SAILOR, MD [facility]  04/11/2024 10:00 AM Lovorn, Duwaine, MD CPR[facility]PR[facility] CPR     Thank you for choosing [facility]! Take care and be well!  Family Medicine Teaching Service Inpatient Team [place]  [facility]  [address] [place], [place] [address] [phone]

## 2024-02-06 ENCOUNTER — Other Ambulatory Visit (HOSPITAL_COMMUNITY): Payer: Self-pay

## 2024-02-06 DIAGNOSIS — J441 Chronic obstructive pulmonary disease with (acute) exacerbation: Secondary | ICD-10-CM | POA: Diagnosis not present

## 2024-02-06 DIAGNOSIS — R0603 Acute respiratory distress: Secondary | ICD-10-CM | POA: Diagnosis not present

## 2024-02-06 DIAGNOSIS — U071 COVID-19: Secondary | ICD-10-CM | POA: Diagnosis not present

## 2024-02-06 DIAGNOSIS — R112 Nausea with vomiting, unspecified: Secondary | ICD-10-CM | POA: Diagnosis not present

## 2024-02-06 LAB — BASIC METABOLIC PANEL
Anion gap: 8 (ref 5–15)
BUN: 18 mg/dL (ref 8–23)
CO2: 24 mmol/L (ref 22–32)
Calcium: 8.1 mg/dL — ABNORMAL LOW (ref 8.9–10.3)
Chloride: 106 mmol/L (ref 98–111)
Creatinine, Ser: 1.12 mg/dL — ABNORMAL HIGH (ref 0.44–1.00)
GFR, Estimated: 51 mL/min — ABNORMAL LOW (ref >60)
Glucose, Bld: 195 mg/dL — ABNORMAL HIGH (ref 70–99)
Potassium: 4.1 mmol/L (ref 3.5–5.1)
Sodium: 138 mmol/L (ref 135–145)

## 2024-02-06 LAB — GLUCOSE, CAPILLARY
Glucose-Capillary: 177 mg/dL — ABNORMAL HIGH (ref 70–99)
Glucose-Capillary: 203 mg/dL — ABNORMAL HIGH (ref 70–99)

## 2024-02-06 MED ORDER — ONDANSETRON 4 MG PO TBDP
4.0000 mg | ORAL_TABLET | Freq: Once | ORAL | Status: AC
  Administered 2024-02-06: 4 mg via ORAL
  Filled 2024-02-06: qty 1

## 2024-02-06 MED ORDER — PREDNISONE 20 MG PO TABS
40.0000 mg | ORAL_TABLET | Freq: Every day | ORAL | 0 refills | Status: AC
  Filled 2024-02-06: qty 2, 1d supply, fill #0

## 2024-02-06 MED ORDER — ONDANSETRON 4 MG PO TBDP
4.0000 mg | ORAL_TABLET | Freq: Three times a day (TID) | ORAL | 0 refills | Status: DC | PRN
  Filled 2024-02-06: qty 20, 7d supply, fill #0

## 2024-02-06 NOTE — Assessment & Plan Note (Signed)
 Now weaned to room air, stable respiratory status and reassuring lung exam today.  Was able to ambulate with pulse ox prior to discharge. - DuoNebs TID - Ambulate with pulse ox prior to discharge - Prednisone  40 mg x 4 days (2/8-2/11) - Wean O2 as tolerated to RA - Continue home Singulair  10 mg daily - Continue home Breo inhaler

## 2024-02-06 NOTE — Progress Notes (Signed)
    Daily Progress Note Intern Pager: [phone]  Patient name: [name] [name] Medical record number: [id number] Date of birth: [birth year] Age: 78 y.o. Gender: female  Primary Care Provider: Carey Chapman, MD Consultants: None Code Status: Full Code   Pt Overview and Major Events to Date:  2/7: Admitted to FMTS  Assessment and Plan: [name] [name] is a 78 y.o. female with past medical history of T2DM, HTN, tobacco use, asthma, CHF, CKDIIIb, CLL, CVA, brain tumor, GERD, HLD and PVD presenting with dyspnea and wheezing in the setting of COPD exacerbation secondary to COVID.  Had an episode of aspiration during hospitalization and has some nausea and vomiting since. Assessment & Plan COPD exacerbation (HCC) Now weaned to room air, stable respiratory status and reassuring lung exam today.  Was able to ambulate with pulse ox prior to discharge. - DuoNebs TID - Ambulate with pulse ox prior to discharge - Prednisone  40 mg x 4 days (2/8-2/11) - Wean O2 as tolerated to RA - Continue home Singulair  10 mg daily - Continue home Breo inhaler COVID - Renally dose Paxlovid  discontinued - Supportive care T2DM (type 2 diabetes mellitus) (HCC) CBG improved this morning to 77.  Holding home Metformin  1000 mg daily, Trulicity  and Jardiance  25 mg daily. - Continue rSSI Nausea and vomiting Nausea and vomiting persists this morning after her breakfast. No hematemesis. - Zofran  4 mg PRN  Chronic and Stable Problems: PVD: Continue home Plavix  75 mg daily HTN: Continue home Losartan  50 mg daily and Amlodipine  10 mg daily HLD: Continue home Atorvastatin  80 mg daily Neuropathy: Continue Gabapentin  600mg  daily and Duloxetine  60 mg daily CKDIIIb: Creatinine 1.29, baseline ~1.4 CHF: Home Furosemide  20mg  daily restarted  FEN/GI: Carb modified PPx: Lovenox  Dispo: Home pending symptomatic improvement  Subjective:  Patient is doing better this morning.  She is still endorses having a cough.  Nausea still  persists and she had an episode of vomiting this morning.  Objective: Temp:  [97.6 F (36.4 C)-98.8 F (37.1 C)] 98.5 F (36.9 C) (02/10 1105) Pulse Rate:  [58-74] 64 (02/10 1105) Resp:  [14-19] 16 (02/10 1105) BP: (144-163)/(69-88) 155/73 (02/10 1105) SpO2:  [92 %-94 %] 93 % (02/10 1105) Physical Exam: General: Sitting in bed comfortably in NAD. Cardiovascular: RRR.  No M/R/G. Respiratory: CTAB.  Mild end expiratory wheezes.  Normal WOB on RA. Abdomen: Soft, nontender, nondistended.  Normoactive bowel sounds. Extremities: No BLE edema.  Mild TTP over BLE.  Laboratory: Most recent CBC Lab Results  Component Value Date   WBC 16.9 (H) 02/05/2024   HGB 12.5 02/05/2024   HCT 39.8 02/05/2024   MCV 89.2 02/05/2024   PLT 208 02/05/2024   Most recent BMP    Latest Ref Rng & Units 02/06/2024    2:24 AM  BMP  Glucose 70 - 99 mg/dL 130   BUN 8 - 23 mg/dL 18   Creatinine 8.65 - 1.00 mg/dL 7.84   Sodium 696 - 295 mmol/L 138   Potassium 3.5 - 5.1 mmol/L 4.1   Chloride 98 - 111 mmol/L 106   CO2 22 - 32 mmol/L 24   Calcium  8.9 - 10.3 mg/dL 8.1    Imaging/Diagnostic Tests: No new imaging.  Clyda Dark, DO 02/06/2024, 12:37 PM  PGY-1, [facility] Family Medicine FPTS Intern pager: [phone], text pages welcome Secure chat group [facility] Teaching Service

## 2024-02-06 NOTE — Assessment & Plan Note (Signed)
 Nausea and vomiting persists this morning after her breakfast. No hematemesis. - Zofran  4 mg PRN

## 2024-02-06 NOTE — TOC Transition Note (Signed)
 Transition of Care [facility]) - Discharge Note   Patient Details  Name: [name] [name] MRN: [id number] Date of Birth: 04/30/1946  Transition of Care [facility]) CM/SW Contact:  Jeani Mill, RN Phone Number: 02/06/2024, 1:26 PM   Clinical Narrative:    Patient stable to discharge home.  No TOC needs at this time.    Final next level of care: Home/Self Care Barriers to Discharge: Barriers Resolved   Patient Goals and CMS Choice      Return home      Discharge Placement               home        Discharge Plan and Services Additional resources added to the After Visit Summary for                                       Social Drivers of Health (SDOH) Interventions SDOH Screenings   Food Insecurity: No Food Insecurity (02/03/2024)  Housing: Unknown (02/03/2024)  Transportation Needs: No Transportation Needs (02/03/2024)  Utilities: Not At Risk (02/03/2024)  Alcohol  Screen: Low Risk  (05/16/2023)  Depression (PHQ2-9): Low Risk  (01/27/2024)  Financial Resource Strain: Medium Risk (05/16/2023)  Physical Activity: Insufficiently Active (05/16/2023)  Social Connections: Moderately Integrated (02/03/2024)  Stress: No Stress Concern Present (05/16/2023)  Tobacco Use: High Risk (02/03/2024)     Readmission Risk Interventions    02/06/2024    1:26 PM 07/27/2021    2:29 PM  Readmission Risk Prevention Plan  Transportation Screening Complete Complete  PCP or Specialist Appt within 5-7 Days Complete   PCP or Specialist Appt within 3-5 Days  Complete  Home Care Screening Complete   Medication Review (RN CM) Complete   HRI or Home Care Consult  Complete  Social Work Consult for Recovery Care Planning/Counseling  Complete  Palliative Care Screening  Not Applicable  Medication Review [employer])  Complete

## 2024-02-06 NOTE — TOC CM/SW Note (Signed)
 Transition of Care [facility]) - Inpatient Brief Assessment   Patient Details  Name: [name] [name] MRN: [id number] Date of Birth: Jun 30, 1946  Transition of Care [facility]) CM/SW Contact:    Jeani Mill, RN Phone Number: 02/06/2024, 12:17 PM   Clinical Narrative: Patient admitted for COPD exacerbation secondary to COVID. No current TOC needs at this time but please place consult if needs arise.      Transition of Care Asessment: Insurance and Status: Insurance coverage has been reviewed Patient has primary care physician: Yes Home environment has been reviewed: safe to discharge home when medically stable Prior level of function:: independent Prior/Current Home Services: No current home services Social Drivers of Health Review: SDOH reviewed no interventions necessary Readmission risk has been reviewed: Yes Transition of care needs: no transition of care needs at this time

## 2024-02-06 NOTE — Discharge Summary (Signed)
 Family Medicine Teaching [facility] Discharge Summary  Patient name: [name] [name] Medical record number: [id number] Date of birth: 05/24/1946 Age: 78 y.o. Gender: female Date of Admission: 02/03/2024  Date of Discharge: 02/06/24  Admitting Physician: Charise Companion, MD  Primary Care Provider: Carey Chapman, MD Consultants: None  Indication for Hospitalization: Dyspnea and O2 requirement  Discharge Diagnoses/Problem List:  Principal Problem for Admission: COPD exacerbation in the setting of COVID Other Problems addressed during stay:  Principal Problem:   COPD exacerbation (HCC) Active Problems:   COVID   Respiratory distress   T2DM (type 2 diabetes mellitus) (HCC)   Nausea and vomiting  Brief Hospital Course:  [name] [name] is a 78 y.o.female with a history of T2DM, HTN, tobacco use, asthma, CHF, CKDIIIb, CLL, CVA, brain tumor, GERD, HLD and PVD who was admitted to the [facility] Medicine Teaching Service at [facility] for COPD exacerbation. Her hospital course is detailed below:  COPD exacerbation likely 2/2 COVID Significant dyspnea and wheezing in the ED, placed on BiPAP, given DuoNebs, Solu-Medrol  and mag with some symptom improvement.  Likely triggered by underlying COVID, started on Paxlovid .  Weaned to room air on 2/8, but began having N/V, likely secondary to Paxlovid .  Plan to complete 5-day steroid course on 2/11.  T2DM Elevated inpatient likely in the setting of steroid use. Restarted on home metformin , Jardiance  and Trulicity  upon discharge.  Other chronic conditions were medically managed with home medications and formulary alternatives as necessary (PVD, HTN, HLD, neuropathy, CKD, CHF)  PCP Follow-up Recommendations: Consider triple therapy inhaler given significant COPD exacerbation. Monitor CBGs while completing steroid course. Titrate BP medications as needed.  Disposition: Home  Discharge Condition: Stable  Discharge Exam:  Vitals:   02/06/24 1008 02/06/24  1105  BP: (!) 163/88 (!) 155/73  Pulse:  64  Resp:  16  Temp:  98.5 F (36.9 C)  SpO2:  93%   General: Sitting in bed comfortably in NAD. Cardiovascular: RRR.  No M/R/G. Respiratory: CTAB.  Mild end expiratory wheezes.  Normal WOB on RA. Abdomen: Soft, nontender, nondistended.  Normoactive bowel sounds. Extremities: No BLE edema.  Mild TTP over BLE.   Significant Procedures: None  Significant Labs and Imaging:  Recent Labs  Lab 02/05/24 0228  WBC 16.9*  HGB 12.5  HCT 39.8  PLT 208   Recent Labs  Lab 02/05/24 0228 02/06/24 0224  NA 139 138  K 4.0 4.1  CL 106 106  CO2 23 24  GLUCOSE 261* 195*  BUN 18 18  CREATININE 1.29* 1.12*  CALCIUM  8.2* 8.1*   CXR: No active cardiopulmonary disease.  Results/Tests Pending at Time of Discharge: None  Discharge Medications:  Allergies as of 02/06/2024       Reactions   Gazyva  [obinutuzumab ] Shortness Of Breath   Acute respiratory distress   Aspirin     Abdominal pain.   No [facility] Food Allergies Diarrhea, Nausea And Vomiting   Cabbage, stomach pain    Aspirin  Other (See Comments)   irritates stomach   Baclofen  Other (See Comments)   Stomach irritation        Medication List     TAKE these medications    Accu-Chek Guide Me w/Device Kit Use to test blood sugar twice per day. E11.9   acetaminophen  500 MG tablet Commonly known as: TYLENOL  Take 500-1,000 mg by mouth every 6 (six) hours as needed for moderate pain.   albuterol  108 (90 Base) MCG/ACT inhaler Commonly known as: VENTOLIN  HFA Inhale 2  puffs into the lungs every 4 (four) hours as needed for wheezing or shortness of breath.   albuterol  (2.5 MG/3ML) 0.083% nebulizer solution Commonly known as: PROVENTIL  Take 3 mLs (2.5 mg total) by nebulization every 6 (six) hours as needed for wheezing or shortness of breath.   Alcohol  Pads 70 % Pads   amLODipine  10 MG tablet Commonly known as: NORVASC  Take 1 tablet (10 mg total) by mouth daily.    atorvastatin  80 MG tablet Commonly known as: LIPITOR  Take 1 tablet by mouth daily.   blood glucose meter kit and supplies Kit Dispense based on patient and insurance preference. Use up to four times daily as directed.   Breo Ellipta  100-25 MCG/ACT Aepb Generic drug: fluticasone  furoate-vilanterol Inhale 1 puff by mouth every day What changed: See the new instructions.   cholecalciferol 25 MCG (1000 UNIT) tablet Commonly known as: VITAMIN D3 Take 1,000 Units by mouth once a week.   clopidogrel  75 MG tablet Commonly known as: PLAVIX  Take 1 tablet (75 mg total) by mouth daily.   DULoxetine  60 MG capsule Commonly known as: CYMBALTA  Take 1 capsule (60 mg total) by mouth daily.   Easy Mini Eject Lancing Device Misc daily.   empagliflozin  25 MG Tabs tablet Commonly known as: Jardiance  Take 1 tablet (25 mg total) by mouth daily.   famotidine  20 MG tablet Commonly known as: PEPCID  Take 1 tablet by mouth twice daily What changed: when to take this   furosemide  20 MG tablet Commonly known as: LASIX  Take 1 tablet (20 mg total) by mouth daily.   gabapentin  300 MG capsule Commonly known as: NEURONTIN  Take 2 capsules (600 mg total) by mouth at bedtime. For nerve pain   iron  polysaccharides 150 MG capsule Commonly known as: NIFEREX Take 1 capsule (150 mg total) by mouth daily.   lidocaine  5 % Commonly known as: Lidoderm  Place 1 patch onto the skin daily. Remove & Discard patch within 12 hours or as directed by MD What changed:  when to take this reasons to take this additional instructions   losartan  50 MG tablet Commonly known as: COZAAR  Take 1 tablet (50 mg total) by mouth at bedtime.   metFORMIN  500 MG 24 hr tablet Commonly known as: GLUCOPHAGE -XR Take 2 tablets (1,000 mg total) by mouth daily with breakfast. Please be seen in clinic before next refill.   methocarbamol  750 MG tablet Commonly known as: Robaxin -750 Take 1 tablet (750 mg total) by mouth 3 (three)  times daily as needed for muscle spasms.   montelukast  10 MG tablet Commonly known as: SINGULAIR  Take 1 tablet (10 mg total) by mouth at bedtime.   nitroGLYCERIN  0.4 MG SL tablet Commonly known as: NITROSTAT  Dissolve 1 tablet under tongue every 5 minutes, up to 3 doses for chest pain What changed:  how much to take how to take this when to take this reasons to take this additional instructions   ondansetron  4 MG disintegrating tablet Commonly known as: ZOFRAN -ODT Take 1 tablet (4 mg total) by mouth every 8 (eight) hours as needed for nausea or vomiting.   OneTouch Delica Plus Lancet33G Misc USE   TO CHECK GLUCOSE ONCE DAILY   OneTouch Verio test strip Generic drug: glucose blood Use to check blood sugar three times daily.   OT ULTRA/FASTTK CNTRL SOLN Soln See admin instructions.   predniSONE  20 MG tablet Commonly known as: DELTASONE  Take 2 tablets (40 mg total) by mouth daily with breakfast for 1 day. What changed:  medication strength  how much to take when to take this   traMADol  50 MG tablet Commonly known as: ULTRAM  Take 1 tablet (50 mg total) by mouth every 12 (twelve) hours as needed for severe pain (pain score 7-10).   Trulicity  0.75 MG/0.5ML Soaj Generic drug: Dulaglutide  Inject 0.75 mg subcutaneously once a week What changed: See the new instructions.   varenicline  1 MG tablet Commonly known as: Chantix  Continuing Month Pak Take 1 tablet (1 mg total) by mouth 2 (two) times daily.        Discharge Instructions: Please refer to Patient Instructions section of EMR for full details.  Patient was counseled important signs and symptoms that should prompt return to medical care, changes in medications, dietary instructions, activity restrictions, and follow up appointments.   Follow-Up Appointments:  Follow-up Information     Carey Chapman, MD Follow up on 02/09/2024.   Specialty: Family Medicine Contact information: [address] [place]  [address] [phone]                 Clyda Dark, DO 02/06/2024, 12:44 PM PGY-1, [facility] Family Medicine

## 2024-02-06 NOTE — Assessment & Plan Note (Signed)
 CBG improved this morning to 77.  Holding home Metformin  1000 mg daily, Trulicity  and Jardiance  25 mg daily. - Continue rSSI

## 2024-02-06 NOTE — Assessment & Plan Note (Addendum)
-   Renally dose Paxlovid  discontinued - Supportive care

## 2024-02-06 NOTE — Inpatient Diabetes Management (Signed)
 Inpatient Diabetes Program Recommendations  AACE/ADA: New Consensus Statement on Inpatient Glycemic Control (2015)  Target Ranges:  Prepandial:   less than 140 mg/dL      Peak postprandial:   less than 180 mg/dL (1-2 hours)      Critically ill patients:  140 - 180 mg/dL   Lab Results  Component Value Date   GLUCAP 177 (H) 02/06/2024   HGBA1C 6.9 11/11/2023    Review of Glycemic Control  Latest Reference Range & Units 02/05/24 06:17 02/05/24 11:52 02/05/24 16:49 02/05/24 21:24 02/06/24 05:51  Glucose-Capillary 70 - 99 mg/dL 098 (H) 119 (H) 147 (H) 267 (H) 177 (H)   Diabetes history: DM 2 Outpatient Diabetes medications: Jardiance  25 mg Daily, Metformin  1000 mg Daily, Trulicity  0.75 mg weekly Current orders for Inpatient glycemic control:  Novolog  0-20 units tid + hs  PO prednisone  40 mg Daily  Note: glucose trends increase after steroid dose and PO intake  Inpatient Diabetes Program Recommendations:    If steroid dose remains the same, consider: -   adding Novolog  4 units tid meal coverage if eating >50% of meals  Thanks,  Eloise Hake RN, MSN, BC-ADM Inpatient Diabetes Coordinator Team Pager [phone] (8a-5p)

## 2024-02-08 ENCOUNTER — Telehealth: Payer: Self-pay
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
## 2024-02-09 ENCOUNTER — Ambulatory Visit: Payer: 59 | Admitting: Family Medicine

## 2024-02-09 ENCOUNTER — Encounter: Payer: Self-pay | Admitting: Family Medicine

## 2024-02-09 VITALS — BP 137/76 | HR 75 | Ht 62.0 in | Wt 134.4 lb

## 2024-02-09 DIAGNOSIS — Z720 Tobacco use: Secondary | ICD-10-CM | POA: Diagnosis not present

## 2024-02-09 DIAGNOSIS — J441 Chronic obstructive pulmonary disease with (acute) exacerbation: Secondary | ICD-10-CM | POA: Diagnosis not present

## 2024-02-09 MED ORDER — TRELEGY ELLIPTA 100-62.5-25 MCG/ACT IN AEPB: 1.0000 | INHALATION_SPRAY | Freq: Every day | RESPIRATORY_TRACT | 5 refills | Status: DC

## 2024-02-09 NOTE — Patient Instructions (Addendum)
Thank you for visiting clinic today - it is always our pleasure to care for you.  Today we discussed how you have been doing since returning home from the hospital.  I am so sorry to hear that you are still feeling rundown.  Your exam today was overall reassuring.  Unfortunately you may continue to feel symptoms from your COVID infection over the next week or so.  Please continue to stay well-hydrated and get lots of rest.  You may take Tylenol as needed for body aches.  I have updated your COPD inhaler.  Please use the new Trelegy inhaler instead of your Breo Ellipta.  I will call you on Monday to check in on how you are feeling.  I hope you get to feeling better soon!  Reach out any time with any questions or concerns you may have - we are here for you!  Ivery Quale, MD [facility] [phone]

## 2024-02-09 NOTE — Progress Notes (Signed)
    SUBJECTIVE:   CHIEF COMPLAINT / HPI:   COPD exacerbation HFU Recently hospitalized for COPD exacerbation found to be COVID-positive.  Has completed Paxlovid course and 5-day course of steroids.  Since returning home patient has been feeling awful.  Reports continued cough, congestion, chest wall pain, body aches, fatigue, headache.  Has been unable to walk many steps without getting short of breath.  Cough has been productive of yellow phlegm, no blood.  Tylenol helps the headache some.  She has been able to eat and drink plenty of fluids.  No fevers.  Is still smoking, up to half a pack a day.   PERTINENT  PMH / PSH: COPD, HFpEF, PAD, T2DM, CKD3, CVA, HTN, CLL, Tobacco use   OBJECTIVE:   BP 137/76   Pulse 75   Ht 5\' 2"  (1.575 m)   Wt 134 lb 6.4 oz (61 kg)   SpO2 100%   BMI 24.58 kg/m   General: Well-appearing. Resting comfortably in room. CV: Normal S1/S2. No extra heart sounds. Warm and well-perfused.  Diffuse chest wall tenderness. Pulm: Breathing comfortably on room air. CTAB. No increased WOB. Abd: Soft, non-distended.  Diffuse mild tenderness.  No rebound or guarding. Skin:  Warm, dry.  Cap refill <2 s.  Psych: Pleasant and appropriate.    ASSESSMENT/PLAN:   Assessment & Plan COPD exacerbation (HCC) Recently hospitalized for COPD exacerbation, found to be COVID-positive.  Completed course of steroids and Paxlovid.  Reassuring vitals and physical exam today. -Start Trelegy in place of current Breo Ellipta -Discussed supportive care measures including hydration and rest -Tylenol as needed for body aches -May try lidocaine patch for chest wall tenderness Tobacco use Has used Chantix for a year.  Smoking up to half a pack per day. -Discontinue Chantix at this time given long-term use -Reassess smoking cessation when patient feeling better -Encouraged patient not to smoke while acutely ill  Plan for call on Monday to check in on symptoms with appt to follow as  indicated.    Ivery Quale, MD [facility]

## 2024-02-09 NOTE — Assessment & Plan Note (Signed)
Recently hospitalized for COPD exacerbation, found to be COVID-positive.  Completed course of steroids and Paxlovid.  Reassuring vitals and physical exam today. -Start Trelegy in place of current Breo Ellipta -Discussed supportive care measures including hydration and rest -Tylenol as needed for body aches -May try lidocaine patch for chest wall tenderness

## 2024-02-10 ENCOUNTER — Other Ambulatory Visit: Payer: Self-pay

## 2024-02-10 DIAGNOSIS — C9110 Chronic lymphocytic leukemia of B-cell type not having achieved remission: Secondary | ICD-10-CM

## 2024-02-13 ENCOUNTER — Inpatient Hospital Stay: Payer: 59 | Attending: Hematology

## 2024-02-13 ENCOUNTER — Inpatient Hospital Stay (HOSPITAL_BASED_OUTPATIENT_CLINIC_OR_DEPARTMENT_OTHER): Payer: 59 | Admitting: Hematology

## 2024-02-13 VITALS — BP 113/69 | HR 69 | Temp 97.7°F | Resp 14 | Ht 62.0 in | Wt 135.7 lb

## 2024-02-13 DIAGNOSIS — Z79899 Other long term (current) drug therapy: Secondary | ICD-10-CM | POA: Diagnosis not present

## 2024-02-13 DIAGNOSIS — Z801 Family history of malignant neoplasm of trachea, bronchus and lung: Secondary | ICD-10-CM | POA: Insufficient documentation

## 2024-02-13 DIAGNOSIS — F17210 Nicotine dependence, cigarettes, uncomplicated: Secondary | ICD-10-CM | POA: Diagnosis not present

## 2024-02-13 DIAGNOSIS — C9110 Chronic lymphocytic leukemia of B-cell type not having achieved remission: Secondary | ICD-10-CM

## 2024-02-13 DIAGNOSIS — Z800 Family history of malignant neoplasm of digestive organs: Secondary | ICD-10-CM | POA: Diagnosis not present

## 2024-02-13 DIAGNOSIS — Z808 Family history of malignant neoplasm of other organs or systems: Secondary | ICD-10-CM | POA: Diagnosis not present

## 2024-02-13 DIAGNOSIS — R918 Other nonspecific abnormal finding of lung field: Secondary | ICD-10-CM | POA: Diagnosis not present

## 2024-02-13 LAB — CMP (CANCER CENTER ONLY)
ALT: 22 U/L (ref 0–44)
AST: 12 U/L — ABNORMAL LOW (ref 15–41)
Albumin: 3.5 g/dL (ref 3.5–5.0)
Alkaline Phosphatase: 127 U/L — ABNORMAL HIGH (ref 38–126)
Anion gap: 6 (ref 5–15)
BUN: 12 mg/dL (ref 8–23)
CO2: 33 mmol/L — ABNORMAL HIGH (ref 22–32)
Calcium: 10.5 mg/dL — ABNORMAL HIGH (ref 8.9–10.3)
Chloride: 100 mmol/L (ref 98–111)
Creatinine: 1.49 mg/dL — ABNORMAL HIGH (ref 0.44–1.00)
GFR, Estimated: 36 mL/min — ABNORMAL LOW (ref >60)
Glucose, Bld: 201 mg/dL — ABNORMAL HIGH (ref 70–99)
Potassium: 4.3 mmol/L (ref 3.5–5.1)
Sodium: 139 mmol/L (ref 135–145)
Total Bilirubin: 0.6 mg/dL (ref 0.0–1.2)
Total Protein: 6.1 g/dL — ABNORMAL LOW (ref 6.5–8.1)

## 2024-02-13 LAB — CBC WITH DIFFERENTIAL (CANCER CENTER ONLY)
Abs Immature Granulocytes: 0.19 10*3/uL — ABNORMAL HIGH (ref 0.00–0.07)
Basophils Absolute: 0.1 10*3/uL (ref 0.0–0.1)
Basophils Relative: 0 %
Eosinophils Absolute: 0.1 10*3/uL (ref 0.0–0.5)
Eosinophils Relative: 1 %
HCT: 41.7 % (ref 36.0–46.0)
Hemoglobin: 13.1 g/dL (ref 12.0–15.0)
Immature Granulocytes: 1 %
Lymphocytes Relative: 25 %
Lymphs Abs: 3.6 10*3/uL (ref 0.7–4.0)
MCH: 28.1 pg (ref 26.0–34.0)
MCHC: 31.4 g/dL (ref 30.0–36.0)
MCV: 89.5 fL (ref 80.0–100.0)
Monocytes Absolute: 1.9 10*3/uL — ABNORMAL HIGH (ref 0.1–1.0)
Monocytes Relative: 13 %
Neutro Abs: 8.5 10*3/uL — ABNORMAL HIGH (ref 1.7–7.7)
Neutrophils Relative %: 60 %
Platelet Count: 202 10*3/uL (ref 150–400)
RBC: 4.66 MIL/uL (ref 3.87–5.11)
RDW: 17.5 % — ABNORMAL HIGH (ref 11.5–15.5)
WBC Count: 14.4 10*3/uL — ABNORMAL HIGH (ref 4.0–10.5)
nRBC: 0 % (ref 0.0–0.2)

## 2024-02-13 LAB — LACTATE DEHYDROGENASE: LDH: 148 U/L (ref 98–192)

## 2024-02-13 NOTE — Progress Notes (Signed)
 HEMATOLOGY/ONCOLOGY CLINIC NOTE  Date of Service: 02/13/24   Patient Care Team: Ivery Quale, MD as PCP - General Quintella Reichert, MD as PCP - Cardiology (Cardiology) Juanell Fairly, RN as Case Manager Candise Che, Corene Cornea, MD as Consulting Physician (Hematology) Conley Rolls, [place], [place] as Referring Physician (Optometry)  CHIEF COMPLAINTS/PURPOSE OF CONSULTATION:  Follow-up for continued evaluation and management of CLL  HISTORY OF PRESENTING ILLNESS:  Please see previous note for details on initial presentation  INTERVAL HISTORY:  [name] [name] is here for her 22-month follow-up for CLL.  Patient was last seen by me on 08/10/2023 and she was doing well overall.   Patient was hospitalized from 02/03/2024 to 02/06/2024 due to COPD exacerbation and significant dyspnea with wheezing from COVID-19 infection. She was placed on BiPAP, given DuoNebs, Solu-Medrol. She was also started on Paxlovid for her COVID-19 infection. She was prescribed 5-day steroid course at the time of discharge.   Patient is accompanied by her husband during this visit. Patient notes she has been doing well overall since she got discharged from the hospital on 02/06/2024. She notes that most of her symptoms have improved, but still complains of fatigue due to COVID-19 infection.   She denies any fever, chills, night sweats, unexpected weight loss, chest pain, abdominal pain, or leg swelling. She does complain of back pain.   Patient continues to smoke cigarettes, has cut-down to 5-6 cigarettes daily.    MEDICAL HISTORY:  Past Medical History:  Diagnosis Date   Allergy    environmental   Asthma    Brain tumor (HCC)    Chronic diastolic CHF (congestive heart failure) (HCC)    CKD (chronic kidney disease)    CLL (chronic lymphocytic leukemia) (HCC)    Constipation 11/15/2011   COPD (chronic obstructive pulmonary disease) (HCC)    CVA (cerebral vascular accident) (HCC)    Diabetes (HCC)    GERD  (gastroesophageal reflux disease)    Headache    migraines   Hyperlipidemia    Hypertension    PVD (peripheral vascular disease) (HCC)    Stroke (HCC) 1998   in 1998 due to tumor-right side    SURGICAL HISTORY: Past Surgical History:  Procedure Laterality Date   ABDOMINAL AORTOGRAM W/LOWER EXTREMITY Bilateral 08/12/2020   Procedure: ABDOMINAL AORTOGRAM W/BILATERAL LOWER EXTREMITY RUNOFF;  Surgeon: Nada Libman, MD;  Location: [facility];  Service: Cardiovascular;  Laterality: Bilateral;   ABI  02/2012   ABI <0.65 BL 02/2012   BRAIN MENINGIOMA EXCISION     BRAIN TUMOR EXCISION     Bypass grafting of RLE for PAD claudication      CARDIAC CATHETERIZATION  04/09/208   CESAREAN SECTION     1974, 78 ,80   CHOLECYSTECTOMY, LAPAROSCOPIC     COLONOSCOPY  2014   [place]   ENDARTERECTOMY FEMORAL Right 10/03/2020   Procedure: RIGHT FEMORAL ENDARTERECTOMY WITH VEIN PATCH ANGIOPLASTY;  Surgeon: Nada Libman, MD;  Location: [facility] OR;  Service: Vascular;  Laterality: Right;   ESOPHAGEAL DILATION     FEMORAL-POPLITEAL BYPASS GRAFT Right 10/03/2020   Procedure: RIGHT FEMORAL- BELOW KNEE POPLITEAL BYPASS USING GORE PROPATEN GRAFT;  Surgeon: Nada Libman, MD;  Location: [facility];  Service: Vascular;  Laterality: Right;   PATCH ANGIOPLASTY  10/03/2020   Procedure: VEIN PATCH ANGIOPLASTY;  Surgeon: Nada Libman, MD;  Location: [facility] OR;  Service: Vascular;;    SOCIAL HISTORY: Social History   Socioeconomic History  Marital status: Single    Spouse name: Not on file   Number of children: 3   Years of education: Not on file   Highest education level: Not on file  Occupational History   Occupation: DISABLED    Employer: DISABLED  Tobacco Use   Smoking status: Every Day    Current packs/day: 0.25    Average packs/day: 0.3 packs/day for 48.1 years (12.0 ttl pk-yrs)    Types: Cigarettes    Start date: 01/01/1976    Passive exposure: Current   Smokeless tobacco: Never    Tobacco comments:    ~ 3 cigarettes / day  Vaping Use   Vaping status: Never Used  Substance and Sexual Activity   Alcohol use: Not Currently   Drug use: Not Currently   Sexual activity: Yes  Other Topics Concern   Not on file  Social History Narrative   ** Merged History Encounter **       Health Care POA:  Emergency Contact: Bruce Donath [phone] (c) End of Life Plan:  Who lives with you: Lives with husband Any pets: none Diet: Patient lacks financial resources for much food. Pt reports eating what is available. Exercise: Patient    does not have an exercise plan. Seatbelts: Patient reports wearing seatbelt when in vehicle.  [name] Exposure/Protection: Hobbies: Bowling, computer games, Bingo  Has financial difficulties and transportation issues as she and her husband share transpo   rtation      Social Drivers of Health   Financial Resource Strain: Medium Risk (05/16/2023)   Overall Financial Resource Strain (CARDIA)    Difficulty of Paying Living Expenses: Somewhat hard  Food Insecurity: No Food Insecurity (02/08/2024)   Hunger Vital Sign    Worried About Running Out of Food in the Last Year: Never true    Ran Out of Food in the Last Year: Never true  Transportation Needs: No Transportation Needs (02/08/2024)   PRAPARE - Administrator, Civil Service (Medical): No    Lack of Transportation (Non-Medical): No  Physical Activity: Insufficiently Active (05/16/2023)   Exercise Vital Sign    Days of Exercise per Week: 3 days    Minutes of Exercise per Session: 30 min  Stress: No Stress Concern Present (05/16/2023)   [employer] of Occupational Health - Occupational Stress Questionnaire    Feeling of Stress : Not at all  Social Connections: Moderately Integrated (02/03/2024)   Social Connection and Isolation Panel [NHANES]    Frequency of Communication with Friends and Family: More than three times a week    Frequency of Social Gatherings with Friends and  Family: More than three times a week    Attends Religious Services: 1 to 4 times per year    Active Member of [employer] or Organizations: No    Attends Banker Meetings: Never    Marital Status: Married  Catering manager Violence: Not At Risk (02/08/2024)   Humiliation, Afraid, Rape, and Kick questionnaire    Fear of Current or Ex-Partner: No    Emotionally Abused: No    Physically Abused: No    Sexually Abused: No    FAMILY HISTORY: Family History  Problem Relation Age of Onset   Heart disease Mother    Asthma Mother    Cancer Mother        uterine    Depression Mother    Heart attack Mother 35   Hyperlipidemia Mother    Hypertension Mother    Stroke Mother  Kidney disease Mother    Heart attack Sister    Stroke Sister    Depression Sister    Diabetes Sister    Hyperlipidemia Sister    Depression Sister    Diabetes Sister    Hyperlipidemia Sister    HIV/AIDS Sister    Diabetes Brother    Asthma Brother    Hypertension Brother    Cancer Maternal Aunt        lung   Heart disease Maternal Grandmother    Heart attack Maternal Grandmother    Cancer Brother        colon   HIV/AIDS Brother    Colon cancer Neg Hx     ALLERGIES:  is allergic to gazyva [obinutuzumab], aspirin, no healthtouch food allergies, aspirin, and baclofen.  MEDICATIONS:  Current Outpatient Medications  Medication Sig Dispense Refill   acetaminophen (TYLENOL) 500 MG tablet Take 500-1,000 mg by mouth every 6 (six) hours as needed for moderate pain.     albuterol (PROVENTIL) (2.5 MG/3ML) 0.083% nebulizer solution Take 3 mLs (2.5 mg total) by nebulization every 6 (six) hours as needed for wheezing or shortness of breath. 75 mL 11   albuterol (VENTOLIN HFA) 108 (90 Base) MCG/ACT inhaler Inhale 2 puffs into the lungs every 4 (four) hours as needed for wheezing or shortness of breath. 8.5 each 11   Alcohol Swabs (ALCOHOL PADS) 70 % PADS  (Patient not taking: Reported on 02/03/2024)      amLODipine (NORVASC) 10 MG tablet Take 1 tablet (10 mg total) by mouth daily. 90 tablet 3   atorvastatin (LIPITOR) 80 MG tablet Take 1 tablet by mouth daily. 30 tablet 2   Blood Glucose Calibration (OT ULTRA/FASTTK CNTRL SOLN) SOLN See admin instructions. (Patient not taking: Reported on 02/03/2024)     blood glucose meter kit and supplies KIT Dispense based on patient and insurance preference. Use up to four times daily as directed. (Patient not taking: Reported on 02/03/2024) 1 each 0   Blood Glucose Monitoring Suppl (ACCU-CHEK GUIDE ME) w/Device KIT Use to test blood sugar twice per day. E11.9 (Patient not taking: Reported on 02/03/2024) 1 kit 1   cholecalciferol (VITAMIN D3) 25 MCG (1000 UNIT) tablet Take 1,000 Units by mouth once a week.     clopidogrel (PLAVIX) 75 MG tablet Take 1 tablet (75 mg total) by mouth daily. 30 tablet 3   DULoxetine (CYMBALTA) 60 MG capsule Take 1 capsule (60 mg total) by mouth daily. 30 capsule 3   empagliflozin (JARDIANCE) 25 MG TABS tablet Take 1 tablet (25 mg total) by mouth daily. 30 tablet 4   famotidine (PEPCID) 20 MG tablet Take 1 tablet by mouth twice daily (Patient taking differently: Take 20 mg by mouth at bedtime.) 60 tablet 11   Fluticasone-Umeclidin-Vilant (TRELEGY ELLIPTA) 100-62.5-25 MCG/ACT AEPB Inhale 1 puff into the lungs daily. 60 each 5   furosemide (LASIX) 20 MG tablet Take 1 tablet (20 mg total) by mouth daily. 30 tablet 3   gabapentin (NEURONTIN) 300 MG capsule Take 2 capsules (600 mg total) by mouth at bedtime. For nerve pain 180 capsule 1   glucose blood (ONETOUCH VERIO) test strip Use to check blood sugar three times daily. (Patient not taking: Reported on 02/03/2024) 100 each 12   iron polysaccharides (NIFEREX) 150 MG capsule Take 1 capsule (150 mg total) by mouth daily. (Patient not taking: Reported on 02/08/2024) 30 capsule 5   Lancet Devices (EASY MINI EJECT LANCING DEVICE) MISC daily. (Patient not taking: Reported on  02/03/2024)     Lancets  (ONETOUCH DELICA PLUS LANCET33G) MISC USE   TO CHECK GLUCOSE ONCE DAILY (Patient not taking: Reported on 02/03/2024) 100 each 0   lidocaine (LIDODERM) 5 % Place 1 patch onto the skin daily. Remove & Discard patch within 12 hours or as directed by MD (Patient taking differently: Place 1 patch onto the skin daily as needed (for pain).) 30 patch 0   losartan (COZAAR) 50 MG tablet Take 1 tablet (50 mg total) by mouth at bedtime. 90 tablet 3   metFORMIN (GLUCOPHAGE-XR) 500 MG 24 hr tablet Take 2 tablets (1,000 mg total) by mouth daily with breakfast. Please be seen in clinic before next refill. 180 tablet 1   methocarbamol (ROBAXIN-750) 750 MG tablet Take 1 tablet (750 mg total) by mouth 3 (three) times daily as needed for muscle spasms. 180 tablet 0   montelukast (SINGULAIR) 10 MG tablet Take 1 tablet (10 mg total) by mouth at bedtime. 30 tablet 11   nitroGLYCERIN (NITROSTAT) 0.4 MG SL tablet Dissolve 1 tablet under tongue every 5 minutes, up to 3 doses for chest pain (Patient taking differently: Place 0.4 mg under the tongue every 5 (five) minutes as needed for chest pain.) 25 tablet 3   ondansetron (ZOFRAN-ODT) 4 MG disintegrating tablet Take 1 tablet (4 mg total) by mouth every 8 (eight) hours as needed for nausea or vomiting. 20 tablet 0   traMADol (ULTRAM) 50 MG tablet Take 1 tablet (50 mg total) by mouth every 12 (twelve) hours as needed for severe pain (pain score 7-10). 60 tablet 5   TRULICITY 0.75 MG/0.5ML SOPN Inject 0.75 mg subcutaneously once a week (Patient taking differently: Take 0.75 mg by mouth once a week.) 2 mL 11   No current facility-administered medications for this visit.    REVIEW OF SYSTEMS:    .10 Point review of Systems was done is negative except as noted above.  PHYSICAL EXAMINATION: ECOG FS:2 - Symptomatic, <50% confined to bed  Vitals:   02/13/24 1020  BP: 113/69  Pulse: 69  Resp: 14  Temp: 97.7 F (36.5 C)  SpO2: 97%   Wt Readings from Last 3 Encounters:   02/09/24 134 lb 6.4 oz (61 kg)  02/03/24 135 lb 12.9 oz (61.6 kg)  01/27/24 133 lb 9.6 oz (60.6 kg)   Body mass index is 24.82 kg/m.   Marland Kitchen GENERAL:alert, in no acute distress and comfortable SKIN: no acute rashes, no significant lesions EYES: conjunctiva are pink and non-injected, sclera anicteric OROPHARYNX: MMM, no exudates, no oropharyngeal erythema or ulceration NECK: supple, no JVD LYMPH:  no palpable lymphadenopathy in the cervical, axillary or inguinal regions LUNGS: clear to auscultation b/l with normal respiratory effort HEART: regular rate & rhythm ABDOMEN:  normoactive bowel sounds , non tender, not distended.  No palpable hepatosplenomegaly Extremity: no pedal edema PSYCH: alert & oriented x 3 with fluent speech NEURO: no focal motor/sensory deficits     LABORATORY DATA:  I have reviewed the data as listed      Latest Ref Rng & Units 02/13/2024    9:34 AM 02/05/2024    2:28 AM  CBC  WBC 4.0 - 10.5 K/uL 14.4  16.9   Hemoglobin 12.0 - 15.0 g/dL 16.1  09.6   Hematocrit 36.0 - 46.0 % 41.7  39.8   Platelets 150 - 400 K/uL 202  208        Latest Ref Rng & Units 02/13/2024    9:34 AM 02/06/2024  2:24 AM  CMP  Glucose 70 - 99 mg/dL 562  130   BUN 8 - 23 mg/dL 12  18   Creatinine 8.65 - 1.00 mg/dL 7.84  6.96   Sodium 295 - 145 mmol/L 139  138   Potassium 3.5 - 5.1 mmol/L 4.3  4.1   Chloride 98 - 111 mmol/L 100  106   CO2 22 - 32 mmol/L 33  24   Calcium 8.9 - 10.3 mg/dL 28.4  8.1   Total Protein 6.5 - 8.1 g/dL 6.1    Total Bilirubin 0.0 - 1.2 mg/dL 0.6    Alkaline Phos 38 - 126 U/L 127    AST 15 - 41 U/L 12    ALT 0 - 44 U/L 22      Lab Results  Component Value Date   LDH 132 08/10/2023         . Lab Results  Component Value Date   LDH 132 08/10/2023    RADIOGRAPHIC STUDIES: I have personally reviewed the radiological images as listed and agreed with the findings in the report. DG CHEST PORT 1 VIEW Result Date: 02/04/2024 CLINICAL DATA:  COPD  exacerbation, aspiration, COVID positive EXAM: PORTABLE CHEST 1 VIEW COMPARISON:  Chest radiograph from one day prior. FINDINGS: Stable cardiomediastinal silhouette with normal heart size. No pneumothorax. No pleural effusion. No pulmonary edema. No consolidative airspace disease. IMPRESSION: No active disease. Electronically Signed   By: Delbert Phenix M.D.   On: 02/04/2024 17:50   DG Chest Port 1 View Result Date: 02/03/2024 CLINICAL DATA:  Cough, hypoxia EXAM: PORTABLE CHEST 1 VIEW COMPARISON:  08/30/2022 chest radiograph. FINDINGS: Stable cardiomediastinal silhouette with normal heart size. No pneumothorax. No pleural effusion. Lungs appear clear, with no acute consolidative airspace disease and no pulmonary edema. IMPRESSION: No active disease. Electronically Signed   By: Delbert Phenix M.D.   On: 02/03/2024 16:39    Surgical Pathology 12/13/2017   ASSESSMENT & PLAN:   [name] [name] is a wonderful 78 y.o. female with    1. Rai 1 Chronic Lymphocytic Leukemia  - Trisomy 12 mutation. Status post treatment with Gazyva plus venetoclax.  Currently on active surveillance.  #2 .Lung nodules on CT in 12/2017. Rpt CT chest 08/09/2018 - stable  #3 Patient Active Problem List   Diagnosis Date Noted   T2DM (type 2 diabetes mellitus) (HCC) 02/05/2024   Nausea and vomiting 02/05/2024   COPD exacerbation (HCC) 02/03/2024   COVID 02/03/2024   Respiratory distress 02/03/2024   Foraminal stenosis of lumbosacral region 01/14/2023   Right leg swelling 09/03/2022   Meningioma (HCC) 09/03/2022   Polypharmacy 04/07/2022   Iron deficiency anemia 02/12/2021   Peripheral arterial disease (HCC) 10/03/2020   (HFpEF) heart failure with preserved ejection fraction (HCC) 06/11/2020   Muscle spasm 03/07/2020   Venous insufficiency (chronic) (peripheral) 06/22/2019   Pulmonary nodules 01/04/2018   Mass of left lower leg 01/04/2018   Chronic kidney disease (CKD), stage III (moderate) (HCC) 06/04/2016   Chronic  back pain 10/02/2015   DM (diabetes mellitus), type 2 with peripheral vascular complications (HCC) 06/11/2009   Chronic lymphocytic leukemia (HCC) 04/11/2009   HYPERCHOLESTEROLEMIA 02/23/2007   TOBACCO DEPENDENCE 02/23/2007   Essential hypertension 02/23/2007   COPD (chronic obstructive pulmonary disease) (HCC) 02/23/2007   GASTROESOPHAGEAL REFLUX, NO ESOPHAGITIS 02/23/2007    PLAN: -Discussed lab results from today, 02/13/2024, in detail with the patient. CBC shows elevated WBC of 14.4 K. CMP shows elevated creatinine of 1.49, elevated calcium level  of 10.5, elevated blood glucose level of 201.  -Recommend to stay well-hydrated and drink at least 2L of water daily. -Recommend to visit PCP in 3-4 weeks to manage diabetes medication.  -Patient has no lab or clinical evidence of CLL progression at this time. -No indication for additional treatment of the patient's CLL at this time. -We discussed the importance of staying up-to-date with her vaccines. -Discussed the importance of smoking cessation.  -Answered all of patient's questions.   FOLLOW UP: Return to clinic with Dr. Candise Che with labs in 6 months  The total time spent in the appointment was 23 minutes* .  All of the patient's questions were answered with apparent satisfaction. The patient knows to call the clinic with any problems, questions or concerns.   Wyvonnia Lora MD MS AAHIVMS [facility] Hematology/Oncology Physician [facility]  .*Total Encounter Time as defined by the Centers for Medicare and Medicaid Services includes, in addition to the face-to-face time of a patient visit (documented in the note above) non-face-to-face time: obtaining and reviewing outside history, ordering and reviewing medications, tests or procedures, care coordination (communications with other health care professionals or caregivers) and documentation in the medical record.   I,Param Shah,acting as a Neurosurgeon for Wyvonnia Lora, MD.,have  documented all relevant documentation on the behalf of Wyvonnia Lora, MD,as directed by  Wyvonnia Lora, MD while in the presence of Wyvonnia Lora, MD.  .I have reviewed the above documentation for accuracy and completeness, and I agree with the above. Johney Maine MD

## 2024-02-14 ENCOUNTER — Telehealth: Payer: Self-pay | Admitting: Family Medicine

## 2024-02-14 NOTE — Telephone Encounter (Signed)
Attempted to call patient x2 to check in on symptoms. Unable to reach. Left voicemail encouraging patient to reach out if she needs anything.

## 2024-02-17 ENCOUNTER — Ambulatory Visit: Payer: 59 | Admitting: Pharmacist

## 2024-02-17 ENCOUNTER — Ambulatory Visit: Payer: Self-pay | Admitting: Family Medicine

## 2024-02-18 ENCOUNTER — Encounter (HOSPITAL_COMMUNITY): Payer: Self-pay

## 2024-02-18 ENCOUNTER — Emergency Department (HOSPITAL_COMMUNITY): Payer: 59

## 2024-02-18 ENCOUNTER — Emergency Department (HOSPITAL_COMMUNITY)
Admission: EM | Admit: 2024-02-18 | Discharge: 2024-02-18 | Disposition: A | Discharge status: Home / Self Care | Payer: 59 | Attending: Emergency Medicine | Admitting: Emergency Medicine

## 2024-02-18 ENCOUNTER — Other Ambulatory Visit: Payer: Self-pay

## 2024-02-18 DIAGNOSIS — J45909 Unspecified asthma, uncomplicated: Secondary | ICD-10-CM | POA: Insufficient documentation

## 2024-02-18 DIAGNOSIS — R55 Syncope and collapse: Secondary | ICD-10-CM | POA: Insufficient documentation

## 2024-02-18 DIAGNOSIS — I509 Heart failure, unspecified: Secondary | ICD-10-CM | POA: Diagnosis not present

## 2024-02-18 DIAGNOSIS — I130 Hypertensive heart and chronic kidney disease with heart failure and stage 1 through stage 4 chronic kidney disease, or unspecified chronic kidney disease: Secondary | ICD-10-CM | POA: Insufficient documentation

## 2024-02-18 DIAGNOSIS — Z85841 Personal history of malignant neoplasm of brain: Secondary | ICD-10-CM | POA: Insufficient documentation

## 2024-02-18 DIAGNOSIS — E860 Dehydration: Secondary | ICD-10-CM | POA: Insufficient documentation

## 2024-02-18 DIAGNOSIS — Z7951 Long term (current) use of inhaled steroids: Secondary | ICD-10-CM | POA: Diagnosis not present

## 2024-02-18 DIAGNOSIS — R059 Cough, unspecified: Secondary | ICD-10-CM | POA: Diagnosis not present

## 2024-02-18 DIAGNOSIS — E119 Type 2 diabetes mellitus without complications: Secondary | ICD-10-CM | POA: Diagnosis not present

## 2024-02-18 DIAGNOSIS — Z794 Long term (current) use of insulin: Secondary | ICD-10-CM | POA: Diagnosis not present

## 2024-02-18 DIAGNOSIS — R1013 Epigastric pain: Secondary | ICD-10-CM | POA: Diagnosis not present

## 2024-02-18 DIAGNOSIS — Z8616 Personal history of COVID-19: Secondary | ICD-10-CM | POA: Diagnosis not present

## 2024-02-18 DIAGNOSIS — K219 Gastro-esophageal reflux disease without esophagitis: Secondary | ICD-10-CM | POA: Diagnosis not present

## 2024-02-18 DIAGNOSIS — J449 Chronic obstructive pulmonary disease, unspecified: Secondary | ICD-10-CM | POA: Insufficient documentation

## 2024-02-18 DIAGNOSIS — N189 Chronic kidney disease, unspecified: Secondary | ICD-10-CM | POA: Insufficient documentation

## 2024-02-18 DIAGNOSIS — R079 Chest pain, unspecified: Secondary | ICD-10-CM | POA: Diagnosis not present

## 2024-02-18 DIAGNOSIS — Z79899 Other long term (current) drug therapy: Secondary | ICD-10-CM | POA: Diagnosis not present

## 2024-02-18 DIAGNOSIS — I959 Hypotension, unspecified: Secondary | ICD-10-CM | POA: Insufficient documentation

## 2024-02-18 DIAGNOSIS — Z7984 Long term (current) use of oral hypoglycemic drugs: Secondary | ICD-10-CM | POA: Diagnosis not present

## 2024-02-18 LAB — URINALYSIS, W/ REFLEX TO CULTURE (INFECTION SUSPECTED)
Bilirubin Urine: NEGATIVE
Glucose, UA: >=500 mg/dL — AB
Hgb urine dipstick: NEGATIVE
Ketones, ur: NEGATIVE mg/dL
Leukocytes,Ua: NEGATIVE
Nitrite: NEGATIVE
Protein, ur: NEGATIVE mg/dL
Specific Gravity, Urine: 1.013 (ref 1.005–1.030)
pH: 5 (ref 5.0–8.0)

## 2024-02-18 LAB — CBC WITH DIFFERENTIAL/PLATELET
Abs Immature Granulocytes: 0.11 10*3/uL — ABNORMAL HIGH (ref 0.00–0.07)
Basophils Absolute: 0.1 10*3/uL (ref 0.0–0.1)
Basophils Relative: 1 %
Eosinophils Absolute: 0.1 10*3/uL (ref 0.0–0.5)
Eosinophils Relative: 1 %
HCT: 41.6 % (ref 36.0–46.0)
Hemoglobin: 12.8 g/dL (ref 12.0–15.0)
Immature Granulocytes: 1 %
Lymphocytes Relative: 36 %
Lymphs Abs: 5.4 10*3/uL — ABNORMAL HIGH (ref 0.7–4.0)
MCH: 28 pg (ref 26.0–34.0)
MCHC: 30.8 g/dL (ref 30.0–36.0)
MCV: 91 fL (ref 80.0–100.0)
Monocytes Absolute: 1.6 10*3/uL — ABNORMAL HIGH (ref 0.1–1.0)
Monocytes Relative: 11 %
Neutro Abs: 7.6 10*3/uL (ref 1.7–7.7)
Neutrophils Relative %: 50 %
Platelets: 207 10*3/uL (ref 150–400)
RBC: 4.57 MIL/uL (ref 3.87–5.11)
RDW: 16.9 % — ABNORMAL HIGH (ref 11.5–15.5)
WBC: 14.8 10*3/uL — ABNORMAL HIGH (ref 4.0–10.5)
nRBC: 0 % (ref 0.0–0.2)

## 2024-02-18 LAB — COMPREHENSIVE METABOLIC PANEL
ALT: 18 U/L (ref 0–44)
AST: 15 U/L (ref 15–41)
Albumin: 2.8 g/dL — ABNORMAL LOW (ref 3.5–5.0)
Alkaline Phosphatase: 100 U/L (ref 38–126)
Anion gap: 10 (ref 5–15)
BUN: 13 mg/dL (ref 8–23)
CO2: 22 mmol/L (ref 22–32)
Calcium: 8.7 mg/dL — ABNORMAL LOW (ref 8.9–10.3)
Chloride: 109 mmol/L (ref 98–111)
Creatinine, Ser: 1.44 mg/dL — ABNORMAL HIGH (ref 0.44–1.00)
GFR, Estimated: 38 mL/min — ABNORMAL LOW (ref >60)
Glucose, Bld: 187 mg/dL — ABNORMAL HIGH (ref 70–99)
Potassium: 3.6 mmol/L (ref 3.5–5.1)
Sodium: 141 mmol/L (ref 135–145)
Total Bilirubin: 0.6 mg/dL (ref 0.0–1.2)
Total Protein: 5.6 g/dL — ABNORMAL LOW (ref 6.5–8.1)

## 2024-02-18 LAB — TROPONIN I (HIGH SENSITIVITY)
Troponin I (High Sensitivity): 9 ng/L (ref <18)
Troponin I (High Sensitivity): 8 ng/L (ref <18)

## 2024-02-18 LAB — CBG MONITORING, ED: Glucose-Capillary: 174 mg/dL — ABNORMAL HIGH (ref 70–99)

## 2024-02-18 MED ORDER — IOHEXOL 350 MG/ML SOLN
75.0000 mL | Freq: Once | INTRAVENOUS | Status: AC | PRN
  Administered 2024-02-18: 75 mL via INTRAVENOUS

## 2024-02-18 MED ORDER — ALUM & MAG HYDROXIDE-SIMETH 200-200-20 MG/5ML PO SUSP
15.0000 mL | Freq: Once | ORAL | Status: AC
  Administered 2024-02-18: 15 mL via ORAL
  Filled 2024-02-18: qty 30

## 2024-02-18 NOTE — ED Notes (Signed)
 Pt states she fell on her hardwood floors when trying to stand up. Pt says she isn't sure if she hit her head or how long she was out. Pt daughter and husband helped pt off the floor and called 911

## 2024-02-18 NOTE — Discharge Instructions (Addendum)
 It was a pleasure caring for you today.  Workup today was reassuring.  Please follow-up with your primary care provider for the incidental kidney cyst findings on your CT today.  Seek emergency care if experiencing any new or worsening symptoms.

## 2024-02-18 NOTE — ED Notes (Signed)
 Pt taken to bathroom with 1 assist into the restroom.

## 2024-02-18 NOTE — ED Provider Notes (Signed)
 [facility] AT [facility] Provider Note   CSN: [id number] Arrival date & time: 02/18/24  1036     History  Chief Complaint  Patient presents with   Near Syncope    [name] [name] is a 78 y.o. female with PMHx constipation, CVA on Plavix, asthma/COPD, brain tumor, CHF, CKD, CLL, DM, GERD, headaches, HLD, HTN who presents to ED concerned for pre-syncopal episode earlier today. Patient stating that she stood up from her walker to go to the bathroom when she started feeling lightheaded. Patient then became weak, her legs gave out from underneath her, and she collapsed to the floor. No LOC or head trauma. Patient stating that she did have an episode of chest pain yesterday. Patient also reports coughing recently but denies SOB. Patient was diagnosed with COVID 10 days ago.   EMS noted patient to be hypotensive (78/40) which resolved after NS bolus. EMS also noting wheezing and provided patient with albuterol treatment.  Denies nausea, vomiting, diarrhea, abdominal pain, dysuria, hematuria.    Near Syncope       Home Medications Prior to Admission medications   Medication Sig Start Date End Date Taking? Authorizing Provider  acetaminophen (TYLENOL) 500 MG tablet Take 500-1,000 mg by mouth every 6 (six) hours as needed for moderate pain.    [provider]  albuterol (PROVENTIL) (2.5 MG/3ML) 0.083% nebulizer solution Take 3 mLs (2.5 mg total) by nebulization every 6 (six) hours as needed for wheezing or shortness of breath. 07/29/23   Ivery Quale, MD  albuterol (VENTOLIN HFA) 108 (90 Base) MCG/ACT inhaler Inhale 2 puffs into the lungs every 4 (four) hours as needed for wheezing or shortness of breath. 07/28/23   Ivery Quale, MD  Alcohol Swabs (ALCOHOL PADS) 70 % PADS  11/27/20   [provider]  amLODipine (NORVASC) 10 MG tablet Take 1 tablet (10 mg total) by mouth daily. 07/15/23   Elberta Fortis, MD  atorvastatin (LIPITOR) 80 MG  tablet Take 1 tablet by mouth daily. 12/09/23   Everhart, Kirstie, DO  Blood Glucose Calibration (OT ULTRA/FASTTK CNTRL SOLN) SOLN See admin instructions. Patient not taking: Reported on 02/03/2024 11/27/20   [provider]  blood glucose meter kit and supplies KIT Dispense based on patient and insurance preference. Use up to four times daily as directed. Patient not taking: Reported on 02/03/2024 05/31/22   Maury Dus, MD  Blood Glucose Monitoring Suppl (ACCU-CHEK GUIDE ME) w/Device KIT Use to test blood sugar twice per day. E11.9 Patient not taking: Reported on 02/03/2024 06/03/22   Maury Dus, MD  cholecalciferol (VITAMIN D3) 25 MCG (1000 UNIT) tablet Take 1,000 Units by mouth once a week.    [provider]  clopidogrel (PLAVIX) 75 MG tablet Take 1 tablet (75 mg total) by mouth daily. 02/03/24   Ivery Quale, MD  DULoxetine (CYMBALTA) 60 MG capsule Take 1 capsule (60 mg total) by mouth daily. 09/22/23   Ivery Quale, MD  empagliflozin (JARDIANCE) 25 MG TABS tablet Take 1 tablet (25 mg total) by mouth daily. 07/29/23   Ivery Quale, MD  famotidine (PEPCID) 20 MG tablet Take 1 tablet by mouth twice daily Patient taking differently: Take 20 mg by mouth at bedtime. 11/28/23   Ivery Quale, MD  Fluticasone-Umeclidin-Vilant (TRELEGY ELLIPTA) 100-62.5-25 MCG/ACT AEPB Inhale 1 puff into the lungs daily. 02/09/24   Ivery Quale, MD  furosemide (LASIX) 20 MG tablet Take 1 tablet (20 mg total) by mouth daily. 09/22/23   Ivery Quale,  MD  gabapentin (NEURONTIN) 300 MG capsule Take 2 capsules (600 mg total) by mouth at bedtime. For nerve pain 10/12/23   Lovorn, Aundra Millet, MD  glucose blood (ONETOUCH VERIO) test strip Use to check blood sugar three times daily. Patient not taking: Reported on 02/03/2024 04/26/23   Maury Dus, MD  iron polysaccharides (NIFEREX) 150 MG capsule Take 1 capsule (150 mg total) by mouth daily. Patient not taking: Reported on 02/08/2024 09/22/23   Ivery Quale, MD  Lancet Devices  (EASY MINI EJECT LANCING DEVICE) MISC daily. Patient not taking: Reported on 02/03/2024 11/27/20   [provider]  Lancets [facility] DELICA PLUS Collier Bullock) MISC USE   TO CHECK GLUCOSE ONCE DAILY Patient not taking: Reported on 02/03/2024 04/14/23   Maury Dus, MD  lidocaine (LIDODERM) 5 % Place 1 patch onto the skin daily. Remove & Discard patch within 12 hours or as directed by MD Patient taking differently: Place 1 patch onto the skin daily as needed (for pain). 12/01/23   Shitarev, Dimitry, MD  losartan (COZAAR) 50 MG tablet Take 1 tablet (50 mg total) by mouth at bedtime. 07/15/23   Elberta Fortis, MD  metFORMIN (GLUCOPHAGE-XR) 500 MG 24 hr tablet Take 2 tablets (1,000 mg total) by mouth daily with breakfast. Please be seen in clinic before next refill. 11/02/23   Ivery Quale, MD  methocarbamol (ROBAXIN-750) 750 MG tablet Take 1 tablet (750 mg total) by mouth 3 (three) times daily as needed for muscle spasms. 01/19/24   Ivery Quale, MD  montelukast (SINGULAIR) 10 MG tablet Take 1 tablet (10 mg total) by mouth at bedtime. 07/28/23   Ivery Quale, MD  nitroGLYCERIN (NITROSTAT) 0.4 MG SL tablet Dissolve 1 tablet under tongue every 5 minutes, up to 3 doses for chest pain Patient taking differently: Place 0.4 mg under the tongue every 5 (five) minutes as needed for chest pain. 09/22/23   Ivery Quale, MD  ondansetron (ZOFRAN-ODT) 4 MG disintegrating tablet Take 1 tablet (4 mg total) by mouth every 8 (eight) hours as needed for nausea or vomiting. 02/06/24   Elberta Fortis, MD  traMADol (ULTRAM) 50 MG tablet Take 1 tablet (50 mg total) by mouth every 12 (twelve) hours as needed for severe pain (pain score 7-10). 12/29/23   Gerrit Heck, DO  TRULICITY 0.75 MG/0.5ML SOPN Inject 0.75 mg subcutaneously once a week Patient taking differently: Take 0.75 mg by mouth once a week. 06/24/23   Maury Dus, MD      Allergies    Shelly Bombard [obinutuzumab], Aspirin, No healthtouch food allergies, Aspirin, and  Baclofen    Review of Systems   Review of Systems  Cardiovascular:  Positive for near-syncope.    Physical Exam Updated Vital Signs BP (!) 143/73   Pulse 67   Temp 98.2 F (36.8 C)   Resp 14   Ht 5\' 2"  (1.575 m)   Wt 61.5 kg   SpO2 97%   BMI 24.81 kg/m  Physical Exam Vitals and nursing note reviewed.  Constitutional:      General: She is not in acute distress.    Appearance: She is ill-appearing (chronically ill-appearing). She is not toxic-appearing.  HENT:     Head: Normocephalic and atraumatic.     Mouth/Throat:     Mouth: Mucous membranes are moist.  Eyes:     General: No scleral icterus.       Right eye: No discharge.        Left eye: No discharge.     Conjunctiva/sclera: Conjunctivae normal.  Cardiovascular:     Rate and Rhythm: Normal rate and regular rhythm.     Pulses: Normal pulses.     Heart sounds: Normal heart sounds. No murmur heard. Pulmonary:     Effort: Pulmonary effort is normal. No respiratory distress.     Breath sounds: Normal breath sounds. No wheezing, rhonchi or rales.  Abdominal:     General: Abdomen is flat. Bowel sounds are normal. There is no distension.     Palpations: Abdomen is soft. There is no mass.     Tenderness: There is abdominal tenderness.     Comments: Mild epigastric tenderness to palpation  Musculoskeletal:     Right lower leg: No edema.     Left lower leg: No edema.  Skin:    General: Skin is warm and dry.     Findings: No rash.  Neurological:     General: No focal deficit present.     Mental Status: She is alert. Mental status is at baseline.     Comments: GCS 15. Speech is goal oriented. No deficits appreciated to CN III-XII; symmetric eyebrow raise, no facial drooping, tongue midline. Patient has equal grip strength bilaterally with 5/5 strength against resistance in all major muscle groups bilaterally. Sensation to light touch intact. Patient moves extremities without ataxia.   Psychiatric:        Mood and Affect:  Mood normal.        Behavior: Behavior normal.     ED Results / Procedures / Treatments   Labs (all labs ordered are listed, but only abnormal results are displayed) Labs Reviewed  CBC WITH DIFFERENTIAL/PLATELET - Abnormal; Notable for the following components:      Result Value   WBC 14.8 (*)    RDW 16.9 (*)    Lymphs Abs 5.4 (*)    Monocytes Absolute 1.6 (*)    Abs Immature Granulocytes 0.11 (*)    All other components within normal limits  URINALYSIS, W/ REFLEX TO CULTURE (INFECTION SUSPECTED) - Abnormal; Notable for the following components:   Color, Urine STRAW (*)    Glucose, UA >=500 (*)    Bacteria, UA RARE (*)    All other components within normal limits  COMPREHENSIVE METABOLIC PANEL - Abnormal; Notable for the following components:   Glucose, Bld 187 (*)    Creatinine, Ser 1.44 (*)    Calcium 8.7 (*)    Total Protein 5.6 (*)    Albumin 2.8 (*)    GFR, Estimated 38 (*)    All other components within normal limits  CBG MONITORING, ED - Abnormal; Notable for the following components:   Glucose-Capillary 174 (*)    All other components within normal limits  TROPONIN I (HIGH SENSITIVITY)  TROPONIN I (HIGH SENSITIVITY)    EKG EKG Interpretation Date/Time:  Saturday February 18 2024 12:49:01 EST Ventricular Rate:  63 PR Interval:  136 QRS Duration:  82 QT Interval:  421 QTC Calculation: 431 R Axis:   74  Text Interpretation: Sinus rhythm Confirmed by Fulton Reek [id number]) on 02/18/2024 2:14:44 PM  Radiology CT ABDOMEN PELVIS W CONTRAST Result Date: 02/18/2024 CLINICAL DATA:  Acute abdominal pain. EXAM: CT ABDOMEN AND PELVIS WITH CONTRAST TECHNIQUE: Multidetector CT imaging of the abdomen and pelvis was performed using the standard protocol following bolus administration of intravenous contrast. RADIATION DOSE REDUCTION: This exam was performed according to the departmental dose-optimization program which includes automated exposure control, adjustment of the mA  and/or kV according to patient size and/or use  of iterative reconstruction technique. CONTRAST:  75mL OMNIPAQUE IOHEXOL 350 MG/ML SOLN COMPARISON:  08/30/2022 FINDINGS: Lower Chest: No acute findings. Stable wall thickening of visualized distal esophagus, consistent with esophagitis. Hepatobiliary: No suspicious hepatic masses identified. Prior cholecystectomy. No evidence of biliary obstruction. Pancreas:  No mass or inflammatory changes. Spleen: Within normal limits in size and appearance. Adrenals/Urinary Tract: A hypoenhancing lesion is seen in the lateral upper pole of the right kidney measuring 2.1 x 1.1 cm on image 9/8. This was not seen on previous study and could represent a complex cyst or mass. No other suspicious renal masses identified. No evidence of ureteral calculi or hydronephrosis. Unremarkable unopacified urinary bladder. Stomach/Bowel: No evidence of obstruction, inflammatory process or abnormal fluid collections. Normal appendix visualized. Vascular/Lymphatic: No pathologically enlarged lymph nodes. No acute vascular findings. Bilateral common to external artery stents are again seen. A 2 cm right common femoral artery pseudoaneurysm remains stable Reproductive:  No mass or other significant abnormality. Other:  None. Musculoskeletal: No suspicious bone lesions identified. 6 cm intramuscular lipoma is again seen in the left anterior thigh muscles. IMPRESSION: No acute findings. Stable wall thickening of visualized distal esophagus, consistent with esophagitis. New 2.1 cm hypoenhancing lesion in upper pole of right kidney, which could represent a complex cyst or mass. Recommend abdomen MRI without and with contrast for further characterization. Stable 2 cm right common femoral artery pseudoaneurysm. Electronically Signed   By: Danae Orleans M.D.   On: 02/18/2024 17:31   CT Angio Chest PE W/Cm &/Or Wo Cm Result Date: 02/18/2024 CLINICAL DATA:  Cough and shortness of breath. High probability for  pulmonary embolism. EXAM: CT ANGIOGRAPHY CHEST WITH CONTRAST TECHNIQUE: Multidetector CT imaging of the chest was performed using the standard protocol during bolus administration of intravenous contrast. Multiplanar CT image reconstructions and MIPs were obtained to evaluate the vascular anatomy. RADIATION DOSE REDUCTION: This exam was performed according to the departmental dose-optimization program which includes automated exposure control, adjustment of the mA and/or kV according to patient size and/or use of iterative reconstruction technique. CONTRAST:  75mL OMNIPAQUE IOHEXOL 350 MG/ML SOLN COMPARISON:  08/30/2022 FINDINGS: Cardiovascular: Satisfactory opacification of pulmonary arteries noted, and no pulmonary emboli identified. No evidence of thoracic aortic dissection or aneurysm. Mediastinum/Nodes: No masses or pathologically enlarged lymph nodes identified. Diffuse esophageal wall thickening again seen, consistent with esophagitis. No definite hiatal hernia is seen. Lungs/Pleura: No pulmonary mass, infiltrate, or effusion. Mild centrilobular emphysema noted. Upper abdomen: No acute findings. Musculoskeletal: No suspicious bone lesions identified. Review of the MIP images confirms the above findings. IMPRESSION: No evidence of pulmonary embolism. Diffuse esophageal wall thickening again noted, consistent with esophagitis. Emphysema (ICD10-J43.9). Electronically Signed   By: Danae Orleans M.D.   On: 02/18/2024 17:18   DG Chest 2 View Result Date: 02/18/2024 CLINICAL DATA:  Cough. EXAM: CHEST - 2 VIEW COMPARISON:  February 04, 2024. FINDINGS: Stable cardiomediastinal silhouette. Both lungs are clear. The visualized skeletal structures are unremarkable. IMPRESSION: No active cardiopulmonary disease. Electronically Signed   By: Lupita Raider M.D.   On: 02/18/2024 11:43    Procedures Procedures    Medications Ordered in ED Medications  iohexol (OMNIPAQUE) 350 MG/ML injection 75 mL (75 mLs  Intravenous Contrast Given 02/18/24 1647)  alum & mag hydroxide-simeth (MAALOX/MYLANTA) 200-200-20 MG/5ML suspension 15 mL (15 mLs Oral Given 02/18/24 1752)    ED Course/ Medical Decision Making/ A&P  Medical Decision Making Amount and/or Complexity of Data Reviewed Labs: ordered. Radiology: ordered.  Risk OTC drugs. Prescription drug management.   This patient presents to the ED for concern of syncope, this involves an extensive number of treatment options, and is a complaint that carries with it a high risk of complications and morbidity.  The differential diagnosis includes CVA, ICH, intracranial mass, critical dehydration, endocrine abnormality, sepsis/infection, electrolyte abnormality, cardiac arrhythmia.   Co morbidities that complicate the patient evaluation  constipation, CVA, asthma/COPD, brain tumor, CHF, CKD, CLL, DM, GERD, headaches, HLD, HTN   Additional history obtained:  Additional history obtained from 2021 ECHO: 70-75% EF 09/2023 PET CT Cardiac perfusion: The heart is normal in size. No pericardial effusion. Atherosclerotic calcifications of the aortic arch. Moderate coronary atherosclerosis of the LAD   Problem List / ED Course / Critical interventions / Medication management  Patient presents to ED for pre-syncopal episode. Patient stood up to walk to bathroom, felt lightheaded and then became weak - collapsing to the ground. No LOC or head trauma. EMS noting wheezing and hypotension. Both  resolved after NS bolus and breathing treatment given by EMS. Patient ambulated in ED with baseline 1 person assistance and stated that she felt fine. Patient then stating that she started experiencing chest pain and worsening epigastric pain while she was in ED so I decided to pursue further CT imaging to rule out acute process. Physical exam with mild epigastric tenderness to palpation.  Rest of physical exam unremarkable.  Patient  afebrile with stable vitals.  Patient tolerating PO intake in ED. I Ordered, and personally interpreted labs.  CBG 174.  CBC with leukocytosis at 14.8 which seems to be baseline for patient given her CLL.  No anemia.  UA with rare bacteria, but is not overall concerning for UTI today.  Troponins within normal limits.  CMP with mild elevation in creatinine at 1.44 near patient's baseline. I ordered imaging studies including chest xray, CTA chest, CT abdomen/pelvis to assess for process contributing to patient's symptoms . I independently visualized and interpreted imaging which showed esophagitis. I agree with the radiologist interpretation.  CT was also showing a kidney cyst and recommended MRI imaging for further assessment.  I educated patient and family members on this finding.  Patient stated that she would be able to follow-up with her kidney doctor for this incidental finding. The patient was maintained on a cardiac monitor.  I personally viewed and interpreted the EKG/cardiac monitored which showed an underlying rhythm of: sinus rhythm Provided patient with GI cocktail.  Patient stating that this helped resolve her chest/epigastric pain.  It appears that the symptoms were due to her chronic gastritis. It appears that patient's presyncopal episode today was due to a mild dehydration.  Patient feeling better after the 500 mL NS bolus given by EMS.  Patient tolerating oral fluids.  Patient ambulating in ED without complaint.  Patient stating that she feels fine prior to discharge is ready to go home. Staffed with Dr. Earlene Plater who agrees with plan. I have reviewed the patients home medicines and have made adjustments as needed Patient afebrile with stable vitals.  Provided with return precautions.  Discharged in good condition.  Ddx: these are considered less likely due to history of present illness and physical exam -CVA/ICH/intracranial mass: patient without neurodeficits; no history of  seizure -Critical dehydration: BMP/CMP without concern -Sepsis/infection: SIRs criteria not met; patient afebrile without infectious symptoms -Electrolyte abnormality: BMP/CMP without concern  -Cardiac arrhythmia: EKG shows  NSR without acute ST changes   Social Determinants of Health:  geriatric          Final Clinical Impression(s) / ED Diagnoses Final diagnoses:  Near syncope  Dehydration    Rx / DC Orders ED Discharge Orders     None         Margarita Rana 02/18/24 1913    Laurence Spates, MD 02/19/24 1051

## 2024-02-18 NOTE — ED Triage Notes (Signed)
 Pt bib ems from home c/o near syncopal. Pt stood up from chair to go use restroom where she felt dizzy and light-headed. Pt d/c 2/12 covid and copd exacerbation. Pt initial BP 78/40. Pt received 500 cc NS BP 115/70 and wheezing noted. Pt received 5 mg Albuterol/ 0.5 mg Atrovent.   SPO2 98% HR 56 CBG 279 RR 18

## 2024-02-19 ENCOUNTER — Encounter: Payer: Self-pay | Admitting: Hematology

## 2024-02-19 ENCOUNTER — Other Ambulatory Visit: Payer: Self-pay | Admitting: Physical Medicine and Rehabilitation

## 2024-02-24 ENCOUNTER — Other Ambulatory Visit: Payer: Self-pay | Admitting: Family Medicine

## 2024-02-25 NOTE — Telephone Encounter (Signed)
 Chart reviewed. Rx refilled. Request for patient to return to follow up in 1-2 months.l

## 2024-03-18 ENCOUNTER — Other Ambulatory Visit: Payer: Self-pay | Admitting: Family Medicine

## 2024-03-18 DIAGNOSIS — E7800 Pure hypercholesterolemia, unspecified: Secondary | ICD-10-CM

## 2024-03-19 NOTE — Telephone Encounter (Signed)
 Chart reviewed.  Rx refilled.  Plan to check in about dosing and symptoms at next clinic visit.

## 2024-03-19 NOTE — Telephone Encounter (Signed)
 Chart reviewed. Rx refilled.  Plan to check lipid level at next clinic visit.

## 2024-03-21 ENCOUNTER — Encounter: Payer: Self-pay | Admitting: Internal Medicine

## 2024-03-21 ENCOUNTER — Ambulatory Visit: Payer: 59 | Admitting: Internal Medicine

## 2024-03-21 VITALS — BP 124/60 | HR 72 | Ht 60.5 in | Wt 138.0 lb

## 2024-03-21 DIAGNOSIS — Z860101 Personal history of adenomatous and serrated colon polyps: Secondary | ICD-10-CM

## 2024-03-21 DIAGNOSIS — K219 Gastro-esophageal reflux disease without esophagitis: Secondary | ICD-10-CM

## 2024-03-21 DIAGNOSIS — F17210 Nicotine dependence, cigarettes, uncomplicated: Secondary | ICD-10-CM | POA: Diagnosis not present

## 2024-03-21 DIAGNOSIS — Z789 Other specified health status: Secondary | ICD-10-CM | POA: Diagnosis not present

## 2024-03-21 DIAGNOSIS — E119 Type 2 diabetes mellitus without complications: Secondary | ICD-10-CM

## 2024-03-21 DIAGNOSIS — Z860100 Personal history of colon polyps, unspecified: Secondary | ICD-10-CM

## 2024-03-21 DIAGNOSIS — Z720 Tobacco use: Secondary | ICD-10-CM

## 2024-03-21 NOTE — Progress Notes (Signed)
 HISTORY OF PRESENT ILLNESS:  [name] [name] is a 78 y.o. female with COPD, ongoing tobacco abuse, asthma, chronic renal insufficiency, prior stroke on Plavix, hypertension, hyperlipidemia, GERD, CLL, and peripheral vascular disease.  She is sent today by her primary care provider regarding possible surveillance colonoscopy.  She is accompanied by her husband.  The patient has a history of diminutive adenomatous colon polyps and has undergone colonoscopy previously in 2013 [facility] [place]) and most recently with Dr. Orvan Falconer in 2019.  On this most recent examination she was found to have diminutive adenomatous polyps (2), diverticulosis, and internal hemorrhoids.  Consideration of follow-up in 5 years with office appointment to assess clinical appropriateness, recommended.  Patient tells me that she has no active GI complaints.  She does take Pepcid for GERD.  No dysphagia.  She denies change in bowel habits or rectal bleeding.  No abdominal pain.  Review of blood work from February 18, 2024 shows a creatinine of 1.44.  Hemoglobin 12.8.  A CT scan of the abdomen pelvis was performed February 18, 2024 yielded no findings.  Stable wall thickness of the esophagus consistent with esophagitis noted.  She denies dysphagia or weight loss.  She also had a CT of the chest with evidence for emphysema.  No pulmonary embolism.  REVIEW OF SYSTEMS:  All non-GI ROS negative unless otherwise stated in the HPI except for cough, shortness of breath, back pain, lower extremity edema, muscle cramps, headaches  Past Medical History:  Diagnosis Date   Allergy    environmental   Asthma    Brain tumor (HCC)    Chronic diastolic CHF (congestive heart failure) (HCC)    CKD (chronic kidney disease)    CLL (chronic lymphocytic leukemia) (HCC)    Constipation 11/15/2011   COPD (chronic obstructive pulmonary disease) (HCC)    CVA (cerebral vascular accident) (HCC)    Diabetes (HCC)    GERD (gastroesophageal reflux  disease)    Headache    migraines   Hyperlipidemia    Hypertension    Pneumonia    PVD (peripheral vascular disease) (HCC)    Stroke (HCC) 1998   in 1998 due to tumor-right side    Past Surgical History:  Procedure Laterality Date   ABDOMINAL AORTOGRAM W/LOWER EXTREMITY Bilateral 08/12/2020   Procedure: ABDOMINAL AORTOGRAM W/BILATERAL LOWER EXTREMITY RUNOFF;  Surgeon: Nada Libman, MD;  Location: [facility];  Service: Cardiovascular;  Laterality: Bilateral;   ABI  02/2012   ABI <0.65 BL 02/2012   BRAIN MENINGIOMA EXCISION     BRAIN TUMOR EXCISION     Bypass grafting of RLE for PAD claudication      CARDIAC CATHETERIZATION  04/09/208   CESAREAN SECTION     1974, 78 ,80   CHOLECYSTECTOMY, LAPAROSCOPIC     COLONOSCOPY  2014   [place], [place]   ENDARTERECTOMY FEMORAL Right 10/03/2020   Procedure: RIGHT FEMORAL ENDARTERECTOMY WITH VEIN PATCH ANGIOPLASTY;  Surgeon: Nada Libman, MD;  Location: [facility] OR;  Service: Vascular;  Laterality: Right;   ESOPHAGEAL DILATION     FEMORAL-POPLITEAL BYPASS GRAFT Right 10/03/2020   Procedure: RIGHT FEMORAL- BELOW KNEE POPLITEAL BYPASS USING GORE PROPATEN GRAFT;  Surgeon: Nada Libman, MD;  Location: [facility] OR;  Service: Vascular;  Laterality: Right;   PATCH ANGIOPLASTY  10/03/2020   Procedure: VEIN PATCH ANGIOPLASTY;  Surgeon: Nada Libman, MD;  Location: [facility] OR;  Service: Vascular;;    Social History [name] [name]  reports that she has been  smoking cigarettes. She started smoking about 48 years ago. She has a 12.1 pack-year smoking history. She has been exposed to tobacco smoke. She has never used smokeless tobacco. She reports that she does not currently use alcohol. She reports that she does not currently use drugs.  family history includes Asthma in her brother and mother; Cancer in her brother, maternal aunt, and mother; Depression in her mother, sister, and sister; Diabetes in her brother, sister, and sister; HIV/AIDS in her  brother and sister; Heart attack in her maternal grandmother and sister; Heart attack (age of onset: 106) in her mother; Heart disease in her maternal grandmother and mother; Hyperlipidemia in her mother, sister, and sister; Hypertension in her brother, daughter, and mother; Kidney disease in her mother; Stroke in her mother and sister.  Allergies  Allergen Reactions   Gazyva [Obinutuzumab] Shortness Of Breath    Acute respiratory distress   Aspirin     Abdominal pain.    No [employer] Food Allergies Diarrhea and Nausea And Vomiting    Cabbage, stomach pain    Aspirin Other (See Comments)    irritates stomach   Baclofen Other (See Comments)    Stomach irritation       PHYSICAL EXAMINATION: Vital signs: BP 124/60 (BP Location: Left Arm, Patient Position: Sitting, Cuff Size: Normal)   Pulse 72   Ht 5' 0.5" (1.537 m) Comment: height measured without shoes  Wt 138 lb (62.6 kg)   BMI 26.51 kg/m   Constitutional: Chronically ill-appearing, no acute distress.  Reeks of tobacco Psychiatric: alert and oriented x3, cooperative Eyes: extraocular movements intact, anicteric, conjunctiva pink Mouth: oral pharynx moist, no lesions Neck: supple no lymphadenopathy Cardiovascular: heart regular rate and rhythm, no murmur Lungs: clear to auscultation bilaterally with mild end expiratory wheezing Abdomen: soft, nontender, nondistended, no obvious ascites, no peritoneal signs, normal bowel sounds, no organomegaly Rectal: Omitted Extremities: no clubbing or cyanosis.  Lower extremity edema on the right Skin: no lesions on visible extremities Neuro: No focal deficits.  Cranial nerves intact  ASSESSMENT:  1.  Personal history of nonadvanced adenomatous colon polyps.  Previous examinations 2013 and 2019. 2.  MULTIPLE significant medical problems and advanced age. 3.  GERD.  On Pepcid.  May benefit from PPI if breakthrough symptoms noted.   PLAN:  1.  This patient is not an appropriate  candidate for surveillance colonoscopy due to her age and comorbidities. 2.  Reflux precautions 3.  Continue famotidine.  Consider PPI 4.  Resume general medical care with PCP GI follow-up as needed Total time of 45 minutes was spent preparing to see the patient, obtaining comprehensive history, performing medically appropriate physical exam, counseling and educating the patient and her husband regarding the above listed issues, and documenting clinical information in the health record

## 2024-03-21 NOTE — Patient Instructions (Signed)
 Please follow up as needed.  _______________________________________________________  If your blood pressure at your visit was 140/90 or greater, please contact your primary care physician to follow up on this.  _______________________________________________________  If you are age 77 or older, your body mass index should be between 23-30. Your Body mass index is 26.51 kg/m. If this is out of the aforementioned range listed, please consider follow up with your Primary Care Provider.  If you are age 44 or younger, your body mass index should be between 19-25. Your Body mass index is 26.51 kg/m. If this is out of the aformentioned range listed, please consider follow up with your Primary Care Provider.   ________________________________________________________  The [facility] providers would like to encourage you to use [facility] to communicate with providers for non-urgent requests or questions.  Due to long hold times on the telephone, sending your provider a message by [facility] may be a faster and more efficient way to get a response.  Please allow 48 business hours for a response.  Please remember that this is for non-urgent requests.  _______________________________________________________

## 2024-03-29 ENCOUNTER — Telehealth: Payer: Self-pay

## 2024-03-29 ENCOUNTER — Ambulatory Visit

## 2024-03-29 VITALS — Ht 60.5 in | Wt 138.0 lb

## 2024-03-29 DIAGNOSIS — Z0000 Encounter for general adult medical examination without abnormal findings: Secondary | ICD-10-CM

## 2024-03-29 NOTE — Patient Instructions (Addendum)
 [name] [name] , Thank you for taking time to come for your Medicare Wellness Visit. I appreciate your ongoing commitment to your health goals. Please review the following plan we discussed and let me know if I can assist you in the future.   Referrals/Orders/Follow-Ups/Clinician Recommendations: Keep maintaining your health by keeping your appointments with Dr. Ivery Quale and any specialists that you may see.  Call us if you need anything.  Have a great year!!!!  This is a list of the screening recommended for you and due dates:  Health Maintenance  Topic Date Due   DTaP/Tdap/Td vaccine (2 - Tdap) 09/26/2012   Eye exam for diabetics  04/28/2022   Complete foot exam   06/01/2023   Zoster (Shingles) Vaccine (2 of 2) 06/24/2023   Colon Cancer Screening  12/05/2023   Hemoglobin A1C  02/11/2024   COVID-19 Vaccine (7 - Pfizer risk 2024-25 season) 03/21/2024   Medicare Annual Wellness Visit  05/15/2024   Flu Shot  07/27/2024   Yearly kidney health urinalysis for diabetes  11/10/2024   Yearly kidney function blood test for diabetes  02/17/2025   Pneumonia Vaccine  Completed   DEXA scan (bone density measurement)  Completed   Hepatitis C Screening  Completed   HPV Vaccine  Aged Out    Advanced directives: (Declined) Advance directive discussed with you today. Even though you declined this today, please call our office should you change your mind, and we can give you the proper paperwork for you to fill out.  Next Medicare Annual Wellness Visit scheduled for next year: Yes

## 2024-03-29 NOTE — Telephone Encounter (Signed)
 Patient stated during her AWV that she has spoken with [facility] and that she needs a rx for rollator walker to help with balance because she has right leg pain with swelling.  Her other walker is not working due to the wheels.  Please call patient so that rx can be mailed.  Patient is aware that she will have a co-insurance for DME.  Kallum Jorgensen N. Reymundo Poll, LPN [facility] Annual Wellness Team Direct Dial: [phone]

## 2024-03-29 NOTE — Progress Notes (Signed)
 Because this visit was a virtual/telehealth visit,  certain criteria was not obtained, such a blood pressure, CBG if applicable, and timed get up and go. Any medications not marked as "taking" were not mentioned during the medication reconciliation part of the visit. Any vitals not documented were not able to be obtained due to this being a telehealth visit or patient was unable to self-report a recent blood pressure reading due to a lack of equipment at home via telehealth. Vitals that have been documented are verbally provided by the patient.   Subjective:   [name] [name] is a 78 y.o. who presents for a Medicare Wellness preventive visit.  Visit Complete: Virtual I connected with  [name] [name] on 03/29/24 by a audio enabled telemedicine application and verified that I am speaking with the correct person using two identifiers.  Patient Location: Home  Provider Location: Office/Clinic  I discussed the limitations of evaluation and management by telemedicine. The patient expressed understanding and agreed to proceed.  Vital Signs: Because this visit was a virtual/telehealth visit, some criteria may be missing or patient reported. Any vitals not documented were not able to be obtained and vitals that have been documented are patient reported.  VideoDeclined- This patient declined Librarian, academic. Therefore the visit was completed with audio only.  Persons Participating in Visit: Patient.  AWV Questionnaire: No: Patient Medicare AWV questionnaire was not completed prior to this visit.  Cardiac Risk Factors include: advanced age (>27men, >98 women);diabetes mellitus;family history of premature cardiovascular disease;hypertension;sedentary lifestyle;smoking/ tobacco exposure     Objective:    Today's Vitals   03/29/24 1414  Weight: 138 lb (62.6 kg)  Height: 5' 0.5" (1.537 m)  PainSc: 0-No pain   Body mass index is 26.51 kg/m.     03/29/2024     2:20 PM 02/18/2024   10:48 AM 02/06/2024   11:56 AM 02/03/2024    3:16 PM 02/03/2024    3:01 PM 01/19/2024    2:50 PM 12/01/2023    2:49 PM  Advanced Directives  Does Patient Have a Medical Advance Directive? No No  No No No No  Would patient like information on creating a medical advance directive? No - Patient declined  No - Patient declined   No - Patient declined     Current Medications (verified) Outpatient Encounter Medications as of 03/29/2024  Medication Sig   acetaminophen (TYLENOL) 500 MG tablet Take 500-1,000 mg by mouth every 6 (six) hours as needed for moderate pain.   albuterol (PROVENTIL) (2.5 MG/3ML) 0.083% nebulizer solution Take 3 mLs (2.5 mg total) by nebulization every 6 (six) hours as needed for wheezing or shortness of breath.   albuterol (VENTOLIN HFA) 108 (90 Base) MCG/ACT inhaler Inhale 2 puffs into the lungs every 4 (four) hours as needed for wheezing or shortness of breath.   Alcohol Swabs (ALCOHOL PADS) 70 % PADS    amLODipine (NORVASC) 10 MG tablet Take 1 tablet (10 mg total) by mouth daily.   atorvastatin (LIPITOR) 80 MG tablet Take 1 tablet by mouth daily   Blood Glucose Calibration (OT ULTRA/FASTTK CNTRL SOLN) SOLN See admin instructions.   blood glucose meter kit and supplies KIT Dispense based on patient and insurance preference. Use up to four times daily as directed.   Blood Glucose Monitoring Suppl (ACCU-CHEK GUIDE ME) w/Device KIT Use to test blood sugar twice per day. E11.9   cholecalciferol (VITAMIN D3) 25 MCG (1000 UNIT) tablet Take 1,000 Units by mouth once  a week.   clopidogrel (PLAVIX) 75 MG tablet Take 1 tablet (75 mg total) by mouth daily.   DULoxetine (CYMBALTA) 60 MG capsule Take 1 capsule (60 mg total) by mouth daily.   empagliflozin (JARDIANCE) 25 MG TABS tablet Take 1 tablet (25 mg total) by mouth daily.   famotidine (PEPCID) 20 MG tablet Take 1 tablet by mouth twice daily (Patient taking differently: Take 20 mg by mouth at bedtime.)    Fluticasone-Umeclidin-Vilant (TRELEGY ELLIPTA) 100-62.5-25 MCG/ACT AEPB Inhale 1 puff into the lungs daily.   furosemide (LASIX) 20 MG tablet Take 1 tablet (20 mg total) by mouth daily.   gabapentin (NEURONTIN) 300 MG capsule Take 2 capsules by mouth at bedtime   glucose blood (ONETOUCH VERIO) test strip Use to check blood sugar three times daily.   Lancet Devices (EASY MINI EJECT LANCING DEVICE) MISC daily.   Lancets (ONETOUCH DELICA PLUS LANCET33G) MISC USE   TO CHECK GLUCOSE ONCE DAILY (Patient taking differently: USE   TO CHECK GLUCOSE ONCE DAILY)   lidocaine (LIDODERM) 5 % Place 1 patch onto the skin daily. Remove & Discard patch within 12 hours or as directed by MD (Patient taking differently: Place 1 patch onto the skin daily as needed (for pain).)   losartan (COZAAR) 50 MG tablet Take 1 tablet (50 mg total) by mouth at bedtime.   metFORMIN (GLUCOPHAGE-XR) 500 MG 24 hr tablet Take 2 tablets (1,000 mg total) by mouth daily with breakfast. Please be seen in clinic before next refill.   methocarbamol (ROBAXIN) 750 MG tablet Take 1 tablet by mouth every 8 hours as needed for muscle spasms   montelukast (SINGULAIR) 10 MG tablet Take 1 tablet (10 mg total) by mouth at bedtime.   nitroGLYCERIN (NITROSTAT) 0.4 MG SL tablet Dissolve 1 tablet under tongue every 5 minutes, up to 3 doses for chest pain (Patient not taking: Reported on 03/21/2024)   traMADol (ULTRAM) 50 MG tablet Take 1 tablet (50 mg total) by mouth every 12 (twelve) hours as needed for severe pain (pain score 7-10).   TRULICITY 0.75 MG/0.5ML SOPN Inject 0.75 mg subcutaneously once a week (Patient taking differently: Take 0.75 mg by mouth once a week.)   No facility-administered encounter medications on file as of 03/29/2024.    Allergies (verified) Gazyva [obinutuzumab], Aspirin, No healthtouch food allergies, Aspirin, and Baclofen   History: Past Medical History:  Diagnosis Date   Allergy    environmental   Asthma    Brain tumor  (HCC)    Chronic diastolic CHF (congestive heart failure) (HCC)    CKD (chronic kidney disease)    CLL (chronic lymphocytic leukemia) (HCC)    Constipation 11/15/2011   COPD (chronic obstructive pulmonary disease) (HCC)    CVA (cerebral vascular accident) (HCC)    Diabetes (HCC)    GERD (gastroesophageal reflux disease)    Headache    migraines   Hyperlipidemia    Hypertension    Pneumonia    PVD (peripheral vascular disease) (HCC)    Stroke (HCC) 1998   in 1998 due to tumor-right side   Past Surgical History:  Procedure Laterality Date   ABDOMINAL AORTOGRAM W/LOWER EXTREMITY Bilateral 08/12/2020   Procedure: ABDOMINAL AORTOGRAM W/BILATERAL LOWER EXTREMITY RUNOFF;  Surgeon: Nada Libman, MD;  Location: [facility];  Service: Cardiovascular;  Laterality: Bilateral;   ABI  02/2012   ABI <0.65 BL 02/2012   BRAIN MENINGIOMA EXCISION     BRAIN TUMOR EXCISION     Bypass grafting  of RLE for PAD claudication      CARDIAC CATHETERIZATION  04/09/208   CESAREAN SECTION     1974, 78 ,80   CHOLECYSTECTOMY, LAPAROSCOPIC     COLONOSCOPY  2014   [place]   ENDARTERECTOMY FEMORAL Right 10/03/2020   Procedure: RIGHT FEMORAL ENDARTERECTOMY WITH VEIN PATCH ANGIOPLASTY;  Surgeon: Nada Libman, MD;  Location: [facility] OR;  Service: Vascular;  Laterality: Right;   ESOPHAGEAL DILATION     FEMORAL-POPLITEAL BYPASS GRAFT Right 10/03/2020   Procedure: RIGHT FEMORAL- BELOW KNEE POPLITEAL BYPASS USING GORE PROPATEN GRAFT;  Surgeon: Nada Libman, MD;  Location: [facility] OR;  Service: Vascular;  Laterality: Right;   PATCH ANGIOPLASTY  10/03/2020   Procedure: VEIN PATCH ANGIOPLASTY;  Surgeon: Nada Libman, MD;  Location: [facility] OR;  Service: Vascular;;   Family History  Problem Relation Age of Onset   Heart disease Mother    Asthma Mother    Cancer Mother        uterine    Depression Mother    Heart attack Mother 56   Hyperlipidemia Mother    Hypertension Mother    Stroke Mother     Kidney disease Mother    Heart attack Sister    Stroke Sister    Depression Sister    Diabetes Sister    Hyperlipidemia Sister    Depression Sister    Diabetes Sister    Hyperlipidemia Sister    HIV/AIDS Sister    Diabetes Brother    Asthma Brother    Hypertension Brother    Cancer Brother        colon   HIV/AIDS Brother    Heart disease Maternal Grandmother    Heart attack Maternal Grandmother    Cancer Maternal Aunt        lung   Hypertension Daughter    Colon cancer Neg Hx    Social History   Socioeconomic History   Marital status: Single    Spouse name: Not on file   Number of children: 3   Years of education: Not on file   Highest education level: Not on file  Occupational History   Occupation: DISABLED    Employer: DISABLED  Tobacco Use   Smoking status: Every Day    Current packs/day: 0.25    Average packs/day: 0.3 packs/day for 48.2 years (12.1 ttl pk-yrs)    Types: Cigarettes    Start date: 01/01/1976    Passive exposure: Current   Smokeless tobacco: Never   Tobacco comments:    ~ 3 cigarettes / day  Vaping Use   Vaping status: Never Used  Substance and Sexual Activity   Alcohol use: Not Currently   Drug use: Not Currently   Sexual activity: Yes  Other Topics Concern   Not on file  Social History Narrative   ** Merged History Encounter **       Health Care POA:  Emergency Contact: Bruce Donath [phone] (c) End of Life Plan:  Who lives with you: Lives with husband Any pets: none Diet: Patient lacks financial resources for much food. Pt reports eating what is available. Exercise: Patient    does not have an exercise plan. Seatbelts: Patient reports wearing seatbelt when in vehicle.  [name] Exposure/Protection: Hobbies: Bowling, computer games, Bingo  Has financial difficulties and transportation issues as she and her husband share transpo   rtation      Social Drivers of Health   Financial Resource Strain: Low Risk  (03/29/2024)  Overall  Financial Resource Strain (CARDIA)    Difficulty of Paying Living Expenses: Not very hard  Food Insecurity: No Food Insecurity (03/29/2024)   Hunger Vital Sign    Worried About Running Out of Food in the Last Year: Never true    Ran Out of Food in the Last Year: Never true  Transportation Needs: No Transportation Needs (03/29/2024)   PRAPARE - Administrator, Civil Service (Medical): No    Lack of Transportation (Non-Medical): No  Physical Activity: Insufficiently Active (03/29/2024)   Exercise Vital Sign    Days of Exercise per Week: 3 days    Minutes of Exercise per Session: 30 min  Stress: No Stress Concern Present (03/29/2024)   [employer] of Occupational Health - Occupational Stress Questionnaire    Feeling of Stress : Not at all  Social Connections: Moderately Integrated (03/29/2024)   Social Connection and Isolation Panel [NHANES]    Frequency of Communication with Friends and Family: More than three times a week    Frequency of Social Gatherings with Friends and Family: More than three times a week    Attends Religious Services: 1 to 4 times per year    Active Member of [employer] or Organizations: No    Attends Engineer, structural: Never    Marital Status: Married    Tobacco Counseling Ready to quit: Yes Counseling given: No Tobacco comments: ~ 3 cigarettes / day    Clinical Intake:  Pre-visit preparation completed: Yes  Pain : No/denies pain Pain Score: 0-No pain     BMI - recorded: 26.51 Nutritional Status: BMI 25 -29 Overweight Nutritional Risks: None Diabetes: Yes CBG done?: No Did pt. bring in CBG monitor from home?: No  Lab Results  Component Value Date   HGBA1C 6.9 11/11/2023   HGBA1C 7.5 (A) 07/15/2023   HGBA1C 8.5 (A) 04/26/2023     How often do you need to have someone help you when you read instructions, pamphlets, or other written materials from your doctor or pharmacy?: 1 - Never  Interpreter Needed?: No  Information  entered by :: Gilliam Hawkes N. Haile Toppins, LPN.   Activities of Daily Living     03/29/2024    2:22 PM 02/06/2024   11:05 AM  In your present state of health, do you have any difficulty performing the following activities:  Hearing? 0 0  Vision? 0 0  Difficulty concentrating or making decisions? 0 0  Comment BRAIN STIMULATING EXERCISES: READING, PUZZLES, GAMES ON PHONE   Walking or climbing stairs? 1   Dressing or bathing? 0   Doing errands, shopping? 0 0  Preparing Food and eating ? N   Using the Toilet? N   In the past six months, have you accidently leaked urine? N   Do you have problems with loss of bowel control? N   Managing your Medications? N   Managing your Finances? N   Housekeeping or managing your Housekeeping? N     Patient Care Team: Ivery Quale, MD as PCP - General Quintella Reichert, MD as PCP - Cardiology (Cardiology) Juanell Fairly, RN as Case Manager Candise Che, Corene Cornea, MD as Consulting Physician (Hematology) Conley Rolls, [place], [place] as Referring Physician (Optometry) Hilarie Fredrickson, MD as Consulting Physician (Gastroenterology)  Indicate any recent Medical Services you may have received from other than [facility] providers in the past year (date may be approximate).     Assessment:   This is a routine wellness examination for [place].  Hearing/Vision  screen Hearing Screening - Comments:: Denies hearing difficulties.  Vision Screening - Comments:: Wears rx glasses - up to date with routine eye exams with My [facility] Le, OD.    Goals Addressed   None    Depression Screen     03/29/2024    2:27 PM 02/09/2024   11:35 AM 01/27/2024    2:15 PM 01/19/2024    2:50 PM 12/01/2023    2:44 PM 11/11/2023    2:34 PM 10/12/2023   10:09 AM  PHQ 2/9 Scores  PHQ - 2 Score   1 0 0 0 0  PHQ- 9 Score   2 0 0 0   Exception Documentation Patient refusal Patient refusal         Fall Risk     02/09/2024   11:35 AM 01/19/2024    2:45 PM 11/11/2023    2:34 PM 10/12/2023   10:09 AM 09/22/2023     3:20 PM  Fall Risk   Falls in the past year? 0 0 0 0 0  Number falls in past yr: 0 0 0  0  Injury with Fall? 0 0 0  0    MEDICARE RISK AT HOME:  Medicare Risk at Home Any stairs in or around the home?: Yes (FRONT & BACK) If so, are there any without handrails?: No Home free of loose throw rugs in walkways, pet beds, electrical cords, etc?: Yes Adequate lighting in your home to reduce risk of falls?: Yes Use of a cane, walker or w/c?: Yes Grab bars in the bathroom?: No Shower chair or bench in shower?: No (TOO LARGE TO PUT IN SHOWER) Elevated toilet seat or a handicapped toilet?: Yes  TIMED UP AND GO:  Was the test performed?  No  Cognitive Function: 6CIT completed    03/29/2024    2:22 PM 04/27/2011   10:00 AM  MMSE - Mini Mental State Exam  Not completed: Unable to complete   Orientation to time  5  Orientation to Place  5  Registration  3  Attention/ Calculation  5  Recall  1  Language- name 2 objects  2  Language- repeat  1  Language- follow 3 step command  3  Language- read & follow direction  1  Write a sentence  1  Copy design  1  Total score  28        03/29/2024    2:22 PM 05/16/2023   10:52 AM  6CIT Screen  What Year? 0 points 0 points  What month? 0 points 0 points  What time? 0 points 0 points  Count back from 20 0 points 0 points  Months in reverse 0 points 0 points  Repeat phrase 0 points 0 points  Total Score 0 points 0 points    Immunizations Immunization History  Administered Date(s) Administered   Fluad Quad(high Dose 65+) 11/06/2021, 09/23/2022   Fluad Trivalent(High Dose 65+) 09/22/2023   H1N1 12/05/2008   Influenza Split 02/11/2012, 12/07/2015   Influenza Whole 10/01/2008, 09/25/2009, 09/24/2010   Influenza,inj,Quad PF,6+ Mos 10/20/2017, 09/29/2018, 09/19/2019, 01/06/2021   Influenza-Unspecified 10/17/2016   PFIZER(Purple Top)SARS-COV-2 Vaccination 01/25/2020, 03/05/2020, 01/06/2021   Pfizer Covid-19 Vaccine Bivalent Booster 55yrs & up  04/28/2022   Pfizer(Comirnaty)Fall Seasonal Vaccine 12 years and older 02/11/2023, 09/22/2023   Pneumococcal Conjugate-13 11/29/2018   Pneumococcal Polysaccharide-23 10/27/2001, 10/21/2010, 04/11/2017   Td 09/26/2002   Zoster Recombinant(Shingrix) 04/29/2023    Screening Tests Health Maintenance  Topic Date Due   DTaP/Tdap/Td (2 - Tdap)  09/26/2012   OPHTHALMOLOGY EXAM  04/28/2022   FOOT EXAM  06/01/2023   Zoster Vaccines- Shingrix (2 of 2) 06/24/2023   Colonoscopy  12/05/2023   HEMOGLOBIN A1C  02/11/2024   COVID-19 Vaccine (7 - Pfizer risk 2024-25 season) 03/21/2024   Medicare Annual Wellness (AWV)  05/15/2024   INFLUENZA VACCINE  07/27/2024   Diabetic kidney evaluation - Urine ACR  11/10/2024   Diabetic kidney evaluation - eGFR measurement  02/17/2025   Pneumonia Vaccine 56+ Years old  Completed   DEXA SCAN  Completed   Hepatitis C Screening  Completed   HPV VACCINES  Aged Out    Health Maintenance  Health Maintenance Due  Topic Date Due   DTaP/Tdap/Td (2 - Tdap) 09/26/2012   OPHTHALMOLOGY EXAM  04/28/2022   FOOT EXAM  06/01/2023   Zoster Vaccines- Shingrix (2 of 2) 06/24/2023   Colonoscopy  12/05/2023   HEMOGLOBIN A1C  02/11/2024   COVID-19 Vaccine (7 - Pfizer risk 2024-25 season) 03/21/2024   Medicare Annual Wellness (AWV)  05/15/2024   Health Maintenance Items Addressed: Yes  Additional Screening:  Vision Screening: Recommended annual ophthalmology exams for early detection of glaucoma and other disorders of the eye.  Dental Screening: Recommended annual dental exams for proper oral hygiene  Community Resource Referral / Chronic Care Management: CRR required this visit?  No   CCM required this visit?  No     Plan:     I have personally reviewed and noted the following in the patient's chart:   Medical and social history Use of alcohol, tobacco or illicit drugs  Current medications and supplements including opioid prescriptions. Patient is not  currently taking opioid prescriptions. Functional ability and status Nutritional status Physical activity Advanced directives List of other physicians Hospitalizations, surgeries, and ER visits in previous 12 months Vitals Screenings to include cognitive, depression, and falls Referrals and appointments  In addition, I have reviewed and discussed with patient certain preventive protocols, quality metrics, and best practice recommendations. A written personalized care plan for preventive services as well as general preventive health recommendations were provided to patient.     Mickeal Needy, LPN   4/0/9811   After Visit Summary: (MyChart) Due to this being a telephonic visit, the after visit summary with patients personalized plan was offered to patient via MyChart   Notes: Please refer to Routing Comments.

## 2024-04-02 ENCOUNTER — Telehealth: Payer: Self-pay | Admitting: Family Medicine

## 2024-04-02 DIAGNOSIS — R531 Weakness: Secondary | ICD-10-CM

## 2024-04-02 NOTE — Telephone Encounter (Signed)
 Called and spoke with patient regarding Rollator order. Patient reports she has been using rollator for past 1-2 years for difficulty with balance and strength. She uses the rollator both in the home and outside of home. She finds this very helpful.   During the conclusion of the call, patient began yelling in pain, expressing pain in both of her feet. I discussed with encouraged her to seek immediate care for her significant pain. She declined multiple times. I offered to make her an appointment with our clinic tomorrow and she declined. She plans to keep her already scheduled clinic appt on Friday. I again encouraged her to seek immediate care for any worsening in the meantime.

## 2024-04-06 ENCOUNTER — Ambulatory Visit: Admitting: Family Medicine

## 2024-04-06 ENCOUNTER — Encounter: Payer: Self-pay | Admitting: Family Medicine

## 2024-04-06 VITALS — BP 121/69 | HR 81 | Ht 60.5 in | Wt 141.8 lb

## 2024-04-06 DIAGNOSIS — E1151 Type 2 diabetes mellitus with diabetic peripheral angiopathy without gangrene: Secondary | ICD-10-CM | POA: Diagnosis not present

## 2024-04-06 DIAGNOSIS — Z716 Tobacco abuse counseling: Secondary | ICD-10-CM

## 2024-04-06 DIAGNOSIS — N183 Chronic kidney disease, stage 3 unspecified: Secondary | ICD-10-CM

## 2024-04-06 DIAGNOSIS — E7800 Pure hypercholesterolemia, unspecified: Secondary | ICD-10-CM | POA: Diagnosis not present

## 2024-04-06 DIAGNOSIS — M7989 Other specified soft tissue disorders: Secondary | ICD-10-CM | POA: Diagnosis not present

## 2024-04-06 MED ORDER — VARENICLINE TARTRATE 0.5 MG PO TABS: 0.5000 mg | ORAL_TABLET | Freq: Every day | ORAL | 0 refills | Status: DC

## 2024-04-06 NOTE — Progress Notes (Signed)
     SUBJECTIVE:   CHIEF COMPLAINT / HPI:   Leg pain  Stable to worsening bilateral lower leg and feet pain over the past couple of weeks. Wears compression socks at baseline.  Swelling has been worse than usual.  Patient notes her feet feel like they are "burning".  Has been taking all of her medications as prescribed.  She has been able to walk unassisted with cane/walker.  Has not seen her vascular provider in a while. Patient thinks she may have had a DVT before.   Smoking cessation Smokes about 1 pack every 2 days.  Is very interested in quitting smoking.  PERTINENT  PMH / PSH: PAD, L SFA occlusion, R bypass graft in leg, T2DM, CKD3, CVA, HTN, CLL, Tobacco use   OBJECTIVE:   BP 121/69   Pulse 81   Ht 5' 0.5" (1.537 m)   Wt 141 lb 12.8 oz (64.3 kg)   SpO2 99%   BMI 27.24 kg/m   General: Well-appearing, no acute distress.  CV: Normal S1/S2. No extra heart sounds. Warm and well-perfused. Pulm: Breathing comfortably on room air. CTAB, some diminished breath sounds at bases. No increased WOB. Skin: Diffuse edema of lower extremities, R > L to level of knee. Erythematous, warm, shiny BLE. Diffusely tender to light palpation of BLE. Normal and equal sensation of BLE. Normal gait, walking independently with cane.     ASSESSMENT/PLAN:   Assessment & Plan Leg swelling Symptoms and exam concerning for DVT. Not on anti-coagulation, does have hx of CLL. Per last vascular clinic visit, patient with bypass graft on R with left SFA occlusion. No hx of DVT per chart review, though patient reports possible hx.  - Recommended DVT US today. Patient declined. Discussed risks of having DVT including traveling clot and death. Patient expressed understanding and declined. Patient prefers to wait to see her Vascular provider. Discussed ED precautions in the meantime.  - Provided patient with her Vascular provider's information to make an appointment ASAP  - Per most recent Vascular visit, recommend  R leg bypass duplex and ABI 1 year following last visit (08/22/23) Encounter for smoking cessation counseling Previously tried Chantix, would like to try again.  - Renally dosed Chantix 0.5 daily  - Fu in clinic    Other: CKD, HLD - BMP and Lipid panel   RTC in 3 weeks for follow up on smoking cessation.   Ivery Quale, MD [facility]

## 2024-04-06 NOTE — Patient Instructions (Addendum)
 Thank you for visiting clinic today and allowing [place] to participate in your care!  Please contact your Vascular doctor to be seen as soon as possible. [facility] [address] [place], [place] [address] [phone]  Below is the information for your Kidney Doctor.  [facility] Address: [address], [place], [place] [address] Phone: [phone]  Please go straight to the Emergency Room if you notice: - Worsening leg pain - Chest pain - Difficulty breathing - Sudden weakness - Unable to walk  Reach out any time with any questions or concerns you may have - we are here for you!  Ivery Quale, MD [facility] [phone]

## 2024-04-07 LAB — BASIC METABOLIC PANEL WITH GFR
BUN/Creatinine Ratio: 8 — ABNORMAL LOW (ref 12–28)
BUN: 11 mg/dL (ref 8–27)
CO2: 24 mmol/L (ref 20–29)
Calcium: 9.4 mg/dL (ref 8.7–10.3)
Chloride: 101 mmol/L (ref 96–106)
Creatinine, Ser: 1.31 mg/dL — ABNORMAL HIGH (ref 0.57–1.00)
Glucose: 122 mg/dL — ABNORMAL HIGH (ref 70–99)
Potassium: 4.4 mmol/L (ref 3.5–5.2)
Sodium: 142 mmol/L (ref 134–144)
eGFR: 42 mL/min/{1.73_m2} — ABNORMAL LOW (ref >59)

## 2024-04-07 LAB — LIPID PANEL
Chol/HDL Ratio: 3.3 ratio (ref 0.0–4.4)
Cholesterol, Total: 117 mg/dL (ref 100–199)
HDL: 36 mg/dL — ABNORMAL LOW (ref >39)
LDL Chol Calc (NIH): 49 mg/dL (ref 0–99)
Triglycerides: 197 mg/dL — ABNORMAL HIGH (ref 0–149)
VLDL Cholesterol Cal: 32 mg/dL (ref 5–40)

## 2024-04-09 ENCOUNTER — Other Ambulatory Visit: Payer: Self-pay

## 2024-04-09 ENCOUNTER — Telehealth: Payer: Self-pay

## 2024-04-09 DIAGNOSIS — I739 Peripheral vascular disease, unspecified: Secondary | ICD-10-CM

## 2024-04-09 NOTE — Telephone Encounter (Signed)
 Triage: -pt called stating she is having burning in bother her legs and she went to her PCP.  She states they told her to see her vascular dr. -on chart review, pt saw her PCP who recommended further testing which she refused. -returned called to pt who states she would always rather see us  but her husband told her he was not going take her to the hospital for tests- that he was tired of that place.   -description of pain includes beginning about a month ago starting above the ankles to toes, severe burning (feels like fire), pain comes and goes (noted moaning/groaning several times during our conversation), and it is worse when she is standing or walking. Associated symptoms include swelling of both feet and discolored stating they were black but her granddaughter said they were not black.  +ABI/TBI bilaterally 04/13/24.  No provider appt avail at time booking studies.   -pt will proceed to the ED if either foot becomes cold to touch or has worsening pain.

## 2024-04-11 ENCOUNTER — Other Ambulatory Visit: Payer: Self-pay

## 2024-04-11 ENCOUNTER — Encounter: Payer: Self-pay | Admitting: Family Medicine

## 2024-04-11 ENCOUNTER — Emergency Department (HOSPITAL_COMMUNITY)
Admission: EM | Admit: 2024-04-11 | Discharge: 2024-04-11 | Disposition: A | Discharge status: Home / Self Care | Attending: Emergency Medicine | Admitting: Emergency Medicine

## 2024-04-11 ENCOUNTER — Encounter (HOSPITAL_COMMUNITY): Payer: Self-pay

## 2024-04-11 ENCOUNTER — Ambulatory Visit: Payer: 59 | Admitting: Physical Medicine and Rehabilitation

## 2024-04-11 ENCOUNTER — Telehealth: Payer: Self-pay | Admitting: Family Medicine

## 2024-04-11 ENCOUNTER — Emergency Department (HOSPITAL_BASED_OUTPATIENT_CLINIC_OR_DEPARTMENT_OTHER)

## 2024-04-11 DIAGNOSIS — M79604 Pain in right leg: Secondary | ICD-10-CM | POA: Diagnosis present

## 2024-04-11 DIAGNOSIS — M79605 Pain in left leg: Secondary | ICD-10-CM | POA: Insufficient documentation

## 2024-04-11 DIAGNOSIS — Z7902 Long term (current) use of antithrombotics/antiplatelets: Secondary | ICD-10-CM | POA: Insufficient documentation

## 2024-04-11 DIAGNOSIS — R600 Localized edema: Secondary | ICD-10-CM | POA: Insufficient documentation

## 2024-04-11 DIAGNOSIS — M7989 Other specified soft tissue disorders: Secondary | ICD-10-CM

## 2024-04-11 LAB — CBC WITH DIFFERENTIAL/PLATELET
Abs Immature Granulocytes: 0.04 10*3/uL (ref 0.00–0.07)
Basophils Absolute: 0.1 10*3/uL (ref 0.0–0.1)
Basophils Relative: 1 %
Eosinophils Absolute: 0.2 10*3/uL (ref 0.0–0.5)
Eosinophils Relative: 2 %
HCT: 43.4 % (ref 36.0–46.0)
Hemoglobin: 13.3 g/dL (ref 12.0–15.0)
Immature Granulocytes: 0 %
Lymphocytes Relative: 31 %
Lymphs Abs: 3.3 10*3/uL (ref 0.7–4.0)
MCH: 28.2 pg (ref 26.0–34.0)
MCHC: 30.6 g/dL (ref 30.0–36.0)
MCV: 91.9 fL (ref 80.0–100.0)
Monocytes Absolute: 1.7 10*3/uL — ABNORMAL HIGH (ref 0.1–1.0)
Monocytes Relative: 16 %
Neutro Abs: 5.2 10*3/uL (ref 1.7–7.7)
Neutrophils Relative %: 50 %
Platelets: 176 10*3/uL (ref 150–400)
RBC: 4.72 MIL/uL (ref 3.87–5.11)
RDW: 18 % — ABNORMAL HIGH (ref 11.5–15.5)
WBC: 10.5 10*3/uL (ref 4.0–10.5)
nRBC: 0 % (ref 0.0–0.2)

## 2024-04-11 LAB — COMPREHENSIVE METABOLIC PANEL WITH GFR
ALT: 14 U/L (ref 0–44)
AST: 18 U/L (ref 15–41)
Albumin: 3.6 g/dL (ref 3.5–5.0)
Alkaline Phosphatase: 107 U/L (ref 38–126)
Anion gap: 7 (ref 5–15)
BUN: 7 mg/dL — ABNORMAL LOW (ref 8–23)
CO2: 24 mmol/L (ref 22–32)
Calcium: 9 mg/dL (ref 8.9–10.3)
Chloride: 108 mmol/L (ref 98–111)
Creatinine, Ser: 1.27 mg/dL — ABNORMAL HIGH (ref 0.44–1.00)
GFR, Estimated: 44 mL/min — ABNORMAL LOW (ref >60)
Glucose, Bld: 158 mg/dL — ABNORMAL HIGH (ref 70–99)
Potassium: 3.4 mmol/L — ABNORMAL LOW (ref 3.5–5.1)
Sodium: 139 mmol/L (ref 135–145)
Total Bilirubin: 0.6 mg/dL (ref 0.0–1.2)
Total Protein: 6 g/dL — ABNORMAL LOW (ref 6.5–8.1)

## 2024-04-11 LAB — BRAIN NATRIURETIC PEPTIDE: B Natriuretic Peptide: 12.8 pg/mL (ref 0.0–100.0)

## 2024-04-11 MED ORDER — OXYCODONE HCL 5 MG PO TABS
5.0000 mg | ORAL_TABLET | Freq: Once | ORAL | Status: AC
  Administered 2024-04-11: 5 mg via ORAL
  Filled 2024-04-11: qty 1

## 2024-04-11 MED ORDER — KETOROLAC TROMETHAMINE 15 MG/ML IJ SOLN
15.0000 mg | Freq: Once | INTRAMUSCULAR | Status: AC
  Administered 2024-04-11: 15 mg via INTRAMUSCULAR
  Filled 2024-04-11: qty 1

## 2024-04-11 MED ORDER — MORPHINE SULFATE 15 MG PO TABS: 7.5000 mg | ORAL_TABLET | ORAL | 0 refills | Status: DC | PRN

## 2024-04-11 NOTE — Progress Notes (Signed)
 BLE venous duplex has been completed.  Preliminary results given to Dr. Inga Manges.   Results can be found under chart review under CV PROC. 04/11/2024 1:19 PM Natalee Tomkiewicz RVT, RDMS

## 2024-04-11 NOTE — Telephone Encounter (Signed)
 Left generic message for patient informing her I would MyChart message her and send her a letter regarding her recent lab results.

## 2024-04-11 NOTE — ED Triage Notes (Signed)
 Patient arrived per EMS from home with complaint of bilateral lower leg pain, rated 10/10. Diagnosed with blood clot in lower leg on Mon. Patient does not remember which leg. Patient is crying/ screaming. Heat to both legs with pitting edema. Hx of CHF. No SOB no CP. Taking plavix.  VS BP 180/100, Hr 88, RR 18, SpO2 95% RA, CBG 180.

## 2024-04-11 NOTE — Discharge Instructions (Signed)
 Please follow-up with your vascular surgeon in the office.  Please return for worsening pain.   Also take tylenol 1000mg (2 extra strength) four times a day.   Then take the pain medicine if you feel like you need it. Narcotics do not help with the pain, they only make you care about it less.  You can become addicted to this, people may break into your house to steal it.  It will constipate you.  If you drive under the influence of this medicine you can get a DUI.

## 2024-04-11 NOTE — ED Provider Notes (Signed)
 [place] EMERGENCY DEPARTMENT AT [facility] Provider Note   CSN: [id number] Arrival date & time: 04/11/24  0135     History  Chief Complaint  Patient presents with   Leg Pain    [name] [name] is a 78 y.o. female.  78 yo F with a chief complaints of bilateral leg pain.  Describes it as a burning.  This has been going on for at least 3 to 4 weeks.  She denies any injury to the legs.  Feels like they have been a bit more swollen than normal.  She does endorse falling asleep in a chair frequently.  Sleeps normally in a bed.  Has seen her family doctor for this.  Has a scheduled appointment in about 48 hours with her vascular surgeon.  Said that she takes Tylenol for this.  Denies any other medications for this.   Leg Pain      Home Medications Prior to Admission medications   Medication Sig Start Date End Date Taking? Authorizing Provider  morphine (MSIR) 15 MG tablet Take 0.5 tablets (7.5 mg total) by mouth every 4 (four) hours as needed. 04/11/24  Yes Melene Plan, DO  acetaminophen (TYLENOL) 500 MG tablet Take 500-1,000 mg by mouth every 6 (six) hours as needed for moderate pain.    [provider]  albuterol (PROVENTIL) (2.5 MG/3ML) 0.083% nebulizer solution Take 3 mLs (2.5 mg total) by nebulization every 6 (six) hours as needed for wheezing or shortness of breath. 07/29/23   Ivery Quale, MD  albuterol (VENTOLIN HFA) 108 (90 Base) MCG/ACT inhaler Inhale 2 puffs into the lungs every 4 (four) hours as needed for wheezing or shortness of breath. 07/28/23   Ivery Quale, MD  Alcohol Swabs (ALCOHOL PADS) 70 % PADS  11/27/20   [provider]  amLODipine (NORVASC) 10 MG tablet Take 1 tablet (10 mg total) by mouth daily. 07/15/23   Elberta Fortis, MD  atorvastatin (LIPITOR) 80 MG tablet Take 1 tablet by mouth daily 03/19/24   Ivery Quale, MD  Blood Glucose Calibration (OT ULTRA/FASTTK CNTRL SOLN) SOLN See admin instructions. 11/27/20   [provider]   blood glucose meter kit and supplies KIT Dispense based on patient and insurance preference. Use up to four times daily as directed. 05/31/22   Maury Dus, MD  Blood Glucose Monitoring Suppl (ACCU-CHEK GUIDE ME) w/Device KIT Use to test blood sugar twice per day. E11.9 06/03/22   Maury Dus, MD  cholecalciferol (VITAMIN D3) 25 MCG (1000 UNIT) tablet Take 1,000 Units by mouth once a week.    [provider]  clopidogrel (PLAVIX) 75 MG tablet Take 1 tablet (75 mg total) by mouth daily. 02/03/24   Ivery Quale, MD  DULoxetine (CYMBALTA) 60 MG capsule Take 1 capsule (60 mg total) by mouth daily. 09/22/23   Ivery Quale, MD  empagliflozin (JARDIANCE) 25 MG TABS tablet Take 1 tablet (25 mg total) by mouth daily. 07/29/23   Ivery Quale, MD  famotidine (PEPCID) 20 MG tablet Take 1 tablet by mouth twice daily Patient taking differently: Take 20 mg by mouth at bedtime. 11/28/23   Ivery Quale, MD  Fluticasone-Umeclidin-Vilant (TRELEGY ELLIPTA) 100-62.5-25 MCG/ACT AEPB Inhale 1 puff into the lungs daily. 02/09/24   Ivery Quale, MD  furosemide (LASIX) 20 MG tablet Take 1 tablet (20 mg total) by mouth daily. 09/22/23   Ivery Quale, MD  gabapentin (NEURONTIN) 300 MG capsule Take 2 capsules by mouth at bedtime 02/20/24   Genice Rouge, MD  glucose blood (ONETOUCH VERIO) test strip Use to check blood sugar three times daily. 04/26/23   Edsel Grace, MD  Lancet Devices (EASY MINI EJECT LANCING DEVICE) MISC daily. 11/27/20   [provider]  Lancets (ONETOUCH DELICA PLUS LANCET33G) MISC USE   TO CHECK GLUCOSE ONCE DAILY Patient taking differently: USE   TO CHECK GLUCOSE ONCE DAILY 04/14/23   Edsel Grace, MD  lidocaine (LIDODERM) 5 % Place 1 patch onto the skin daily. Remove & Discard patch within 12 hours or as directed by MD Patient taking differently: Place 1 patch onto the skin daily as needed (for pain). 12/01/23   Shitarev, Dimitry, MD  losartan (COZAAR) 50 MG tablet Take 1 tablet (50 mg total)  by mouth at bedtime. 07/15/23   Jonne Netters, MD  metFORMIN (GLUCOPHAGE-XR) 500 MG 24 hr tablet Take 2 tablets (1,000 mg total) by mouth daily with breakfast. Please be seen in clinic before next refill. 11/02/23   Carey Chapman, MD  methocarbamol (ROBAXIN) 750 MG tablet Take 1 tablet by mouth every 8 hours as needed for muscle spasms 03/19/24   Carey Chapman, MD  montelukast (SINGULAIR) 10 MG tablet Take 1 tablet (10 mg total) by mouth at bedtime. 07/28/23   Carey Chapman, MD  nitroGLYCERIN (NITROSTAT) 0.4 MG SL tablet Dissolve 1 tablet under tongue every 5 minutes, up to 3 doses for chest pain Patient not taking: Reported on 03/21/2024 09/22/23   Carey Chapman, MD  traMADol (ULTRAM) 50 MG tablet Take 1 tablet (50 mg total) by mouth every 12 (twelve) hours as needed for severe pain (pain score 7-10). 12/29/23   Rayma Calandra, DO  TRULICITY 0.75 MG/0.5ML SOPN Inject 0.75 mg subcutaneously once a week Patient taking differently: Take 0.75 mg by mouth once a week. 06/24/23   Edsel Grace, MD  varenicline (CHANTIX) 0.5 MG tablet Take 1 tablet (0.5 mg total) by mouth daily. 04/06/24   Carey Chapman, MD      Allergies    Reid Capuchin [obinutuzumab], Aspirin, No healthtouch food allergies, Aspirin, and Baclofen    Review of Systems   Review of Systems  Physical Exam Updated Vital Signs BP (!) 151/70 (BP Location: Right Arm)   Pulse 76   Temp 98.1 F (36.7 C) (Oral)   Resp 18   Ht 5' (1.524 m)   Wt 64 kg   SpO2 100%   BMI 27.54 kg/m  Physical Exam Vitals and nursing note reviewed.  Constitutional:      General: She is not in acute distress.    Appearance: She is well-developed. She is not diaphoretic.  HENT:     Head: Normocephalic and atraumatic.  Eyes:     Pupils: Pupils are equal, round, and reactive to light.  Cardiovascular:     Rate and Rhythm: Normal rate and regular rhythm.     Heart sounds: No murmur heard.    No friction rub. No gallop.  Pulmonary:     Effort: Pulmonary effort is  normal.     Breath sounds: No wheezing or rales.  Abdominal:     General: There is no distension.     Palpations: Abdomen is soft.     Tenderness: There is no abdominal tenderness.  Musculoskeletal:        General: No tenderness.     Cervical back: Normal range of motion and neck supple.     Right lower leg: Edema present.     Left lower leg: Edema present.     Comments: Bilateral lower  extremity edema right greater than left.  Palpable right dorsalis pedis pulse.  I feel like I can likely feel the left dorsalis pedis as well.  Confirmed with Doppler.  Skin:    General: Skin is warm and dry.  Neurological:     Mental Status: She is alert and oriented to person, place, and time.  Psychiatric:        Behavior: Behavior normal.     ED Results / Procedures / Treatments   Labs (all labs ordered are listed, but only abnormal results are displayed) Labs Reviewed  CBC WITH DIFFERENTIAL/PLATELET - Abnormal; Notable for the following components:      Result Value   RDW 18.0 (*)    Monocytes Absolute 1.7 (*)    All other components within normal limits  COMPREHENSIVE METABOLIC PANEL WITH GFR - Abnormal; Notable for the following components:   Potassium 3.4 (*)    Glucose, Bld 158 (*)    BUN 7 (*)    Creatinine, Ser 1.27 (*)    Total Protein 6.0 (*)    GFR, Estimated 44 (*)    All other components within normal limits  BRAIN NATRIURETIC PEPTIDE    EKG None  Radiology No results found.  Procedures Procedures   Discussed smoking cessation with patient and was they were offerred resources to help stop.  Total time was 5 min CPT code 16109.     Medications Ordered in ED Medications  ketorolac (TORADOL) 15 MG/ML injection 15 mg (15 mg Intramuscular Given 04/11/24 1251)  oxyCODONE (Oxy IR/ROXICODONE) immediate release tablet 5 mg (5 mg Oral Given 04/11/24 1140)    ED Course/ Medical Decision Making/ A&P                                 Medical Decision  Making Risk Prescription drug management.   54 yoF with a chief complaints of bilateral leg pain.  My record review the patient does have a history of vascular diseases requiring stenting in the past.  She had also recently seen her family doctor and had scheduled an outpatient ultrasound.  She felt the pain got worse and so came into the ED today for evaluation.  Patient has intact pulses bilaterally.  The feet are warm and well-perfused.  She had lab work done through the [employer] process, no significant electrolyte abnormalities.  No function at baseline.  Awaiting a DVT study.  Vascular ultrasound without DVT.  Patient does have an arterial occlusion in the left lower extremity though appears to be chronic.  Has had a chronic occlusion of the area seen previously.  She has vascular follow-up in 48 hours.  Short course of pain medicine. 1:09 PM:  I have discussed the diagnosis/risks/treatment options with the patient and family.  Evaluation and diagnostic testing in the emergency department does not suggest an emergent condition requiring admission or immediate intervention beyond what has been performed at this time.  They will follow up with Vascular, PCP. We also discussed returning to the ED immediately if new or worsening sx occur. We discussed the sx which are most concerning (e.g., sudden worsening pain, fever, inability to tolerate by mouth) that necessitate immediate return. Medications administered to the patient during their visit and any new prescriptions provided to the patient are listed below.  Medications given during this visit Medications  ketorolac (TORADOL) 15 MG/ML injection 15 mg (15 mg Intramuscular Given 04/11/24 1251)  oxyCODONE (Oxy  IR/ROXICODONE) immediate release tablet 5 mg (5 mg Oral Given 04/11/24 1140)     The patient appears reasonably screen and/or stabilized for discharge and I doubt any other medical condition or other [facility] requiring further screening, evaluation, or  treatment in the ED at this time prior to discharge.           Final Clinical Impression(s) / ED Diagnoses Final diagnoses:  Bilateral leg pain    Rx / DC Orders ED Discharge Orders          Ordered    morphine (MSIR) 15 MG tablet  Every 4 hours PRN        04/11/24 1211              Shekera Beavers, DO 04/11/24 1309

## 2024-04-11 NOTE — ED Provider Triage Note (Signed)
 Emergency Medicine Provider Triage Evaluation Note  TRANG BOUSE , a 78 y.o. female  was evaluated in triage.  Pt complains of bilateral leg pain has been going on for several months, is progressively getting worse, feels like both legs are burning.  States she has been to get ultrasounds, but declined them earlier this week, states the pain is getting worse and would like to get further evaluated.Courtland Ditch taking her Plavix and not missing any doses.  Denies any chest pain, or shortness of breath.  Review of Systems  Positive: Leg pain Negative: Chest pain  Physical Exam  BP (!) 143/78 (BP Location: Right Arm)   Pulse 77   Temp 99.7 F (37.6 C) (Oral)   Resp 19   Ht 5' (1.524 m)   Wt 64 kg   SpO2 98%   BMI 27.54 kg/m  Gen:   Awake, no distress   Resp:  Normal effort  MSK:   Moves extremities without difficulty  Other:  +DP of bilateral feet, 1+ pitting edema of BLE w/mild erythema Medical Decision Making  Medically screening exam initiated at 2:04 AM.  Appropriate orders placed.  MELENA HAYES was informed that the remainder of the evaluation will be completed by another provider, this initial triage assessment does not replace that evaluation, and the importance of remaining in the ED until their evaluation is complete.     Timmy Forbes, [place] 04/11/24 [phone]

## 2024-04-12 ENCOUNTER — Other Ambulatory Visit: Payer: Self-pay

## 2024-04-12 ENCOUNTER — Other Ambulatory Visit: Payer: Self-pay | Admitting: Family Medicine

## 2024-04-12 DIAGNOSIS — I10 Essential (primary) hypertension: Secondary | ICD-10-CM

## 2024-04-12 DIAGNOSIS — E1169 Type 2 diabetes mellitus with other specified complication: Secondary | ICD-10-CM

## 2024-04-12 DIAGNOSIS — I739 Peripheral vascular disease, unspecified: Secondary | ICD-10-CM

## 2024-04-13 ENCOUNTER — Ambulatory Visit (HOSPITAL_COMMUNITY)
Admission: RE | Admit: 2024-04-13 | Discharge: 2024-04-13 | Disposition: A | Discharge status: Home / Self Care | Source: Ambulatory Visit | Attending: Vascular Surgery | Admitting: Vascular Surgery

## 2024-04-13 DIAGNOSIS — I739 Peripheral vascular disease, unspecified: Secondary | ICD-10-CM | POA: Diagnosis present

## 2024-04-13 LAB — VAS US ABI WITH/WO TBI
Left ABI: 0.66
Right ABI: 0.98

## 2024-04-17 ENCOUNTER — Other Ambulatory Visit: Payer: Self-pay | Admitting: Family Medicine

## 2024-04-17 NOTE — Telephone Encounter (Signed)
 Chart reviewed. Rx refilled.

## 2024-04-21 ENCOUNTER — Other Ambulatory Visit: Payer: Self-pay | Admitting: Family Medicine

## 2024-04-25 ENCOUNTER — Encounter: Payer: Self-pay | Admitting: Physical Medicine and Rehabilitation

## 2024-04-25 ENCOUNTER — Encounter: Payer: 59 | Attending: Physical Medicine and Rehabilitation | Admitting: Physical Medicine and Rehabilitation

## 2024-04-25 ENCOUNTER — Telehealth: Payer: Self-pay | Admitting: Family Medicine

## 2024-04-25 VITALS — BP 130/86 | HR 72 | Ht 60.0 in | Wt 137.0 lb

## 2024-04-25 DIAGNOSIS — M545 Low back pain, unspecified: Secondary | ICD-10-CM | POA: Insufficient documentation

## 2024-04-25 DIAGNOSIS — M4807 Spinal stenosis, lumbosacral region: Secondary | ICD-10-CM | POA: Insufficient documentation

## 2024-04-25 DIAGNOSIS — F17210 Nicotine dependence, cigarettes, uncomplicated: Secondary | ICD-10-CM | POA: Diagnosis present

## 2024-04-25 DIAGNOSIS — G8929 Other chronic pain: Secondary | ICD-10-CM | POA: Insufficient documentation

## 2024-04-25 DIAGNOSIS — I739 Peripheral vascular disease, unspecified: Secondary | ICD-10-CM | POA: Diagnosis present

## 2024-04-25 DIAGNOSIS — M7918 Myalgia, other site: Secondary | ICD-10-CM | POA: Insufficient documentation

## 2024-04-25 MED ORDER — BUPROPION HCL ER (XL) 150 MG PO TB24: 150.0000 mg | ORAL_TABLET | Freq: Every day | ORAL | 1 refills | Status: DC

## 2024-04-25 MED ORDER — GABAPENTIN 300 MG PO CAPS
600.0000 mg | ORAL_CAPSULE | Freq: Every day | ORAL | 1 refills | Status: DC
Start: 2024-04-25 — End: 2024-11-07

## 2024-04-25 MED ORDER — LIDOCAINE HCL 1 % IJ SOLN
3.0000 mL | Freq: Once | INTRAMUSCULAR | Status: AC
  Administered 2024-04-25: 3 mL

## 2024-04-25 NOTE — Telephone Encounter (Signed)
-----   Message from Carey Chapman sent at 04/25/2024 12:21 AM EDT ----- Regarding: appt HI! Pls call patient to schedule an appt in the next 2 weeks to follow up on her Chantix /smoking cessation. Thanks so much!

## 2024-04-25 NOTE — Progress Notes (Signed)
 Patient is a 78 yr old female with hx of dCHF, DM 2- A1c 7.3 , COPD?, CLL with prior  WBC >100k- now 7.5, and chronic back and R>>>L foot pain. Has chronic low back and RLE>LLE DM neuropathy. Also has CKD3- Cr 1.39-  Still a smoker- 0.5 ppd.  Here for f/u on Nerve pain   Interested in Trp injections, but scared of needles PCP manages Tramadol -  As well as Robaxin .   Needs refill of Gabapentin    got a couple of pills of MSIR from ED due to L foot pain.  Pain keeping her up-   L foot hurting-  because  has an occlusion in LLE- that's chronic-  We discussed how she knows it's worse due to her smoking.   Patches don't work.  Wants to stop smoking,   Plan: Will con't Gabapentin  600 mg nightly- for nerve pain- # 180- 1 refill- sent in refills   2. Will try Bupropion- or Wellbutrin is other name- 150 mg XL daily-  will reduce cravings- will start at low dose-  can reduce ability to sleep so take before 12 pm or might not sleep.   -continue ot smoke for 2 weeks- put a date on calendar in 2 weeks form now and stop smoking THEN- continue to take Bupropion and most patients need the medicine for 3-6 months.    3. Doesn't respond/cannot have good results with Patches for nicoderm, so will not prescribe again at this point    4.  Willing to try trigger point injections- went over risk and benefits-    5. Patient here for trigger point injections for  Consent done and on chart.  Cleaned areas with alcohol  and injected using a 27 gauge 1.5 inch needle  Injected 1.5cc- wasted 1.5cc-  used cold spray Using 1% Lidocaine  with no EPI  Upper traps B/L  Levators Posterior scalenes Middle scalenes- B/L -  Splenius Capitus Pectoralis Major Rhomboids Infraspinatus Teres Major/minor Thoracic paraspinals Lumbar paraspinals Other injections-    Patient's level of pain prior was  7/10 Current level of pain after injections is 0/10 now  There was no bleeding or complications.  Patient was  advised to drink a lot of water on day after injections to flush system Will have increased soreness for 12-48 hours after injections.  Can use Lidocaine  patches the day AFTER injections Can use theracane on day of injections in places didn't inject Can use heating pad 4-6 hours AFTER injections   6. We discussed how peripheral arterial occlusion is causing the pain in legs-  and we discussed that stopping smoking would help.     6. F/U in 3 months- trp injections and f/u on pain-  and smoking cessation   I spent a total of  26  minutes on total care today- >50% coordination of care- due to  4 minutes on injections with cold spray- d/w smoking cessation- and education on PAD; refills

## 2024-04-25 NOTE — Telephone Encounter (Signed)
 Called to schedule, LVM to call back.   Thanks!

## 2024-04-25 NOTE — Patient Instructions (Signed)
 Plan: Will con't Gabapentin  600 mg nightly- for nerve pain- # 180- 1 refill- sent in refills   2. Will try Bupropion- or Wellbutrin is other name- 150 mg XL daily-  will reduce cravings- will start at low dose-  can reduce ability to sleep so take before 12 pm or might not sleep.   -continue ot smoke for 2 weeks- put a date on calendar in 2 weeks form now and stop smoking THEN- continue to take Bupropion and most patients need the medicine for 3-6 months.    3. Doesn't respond/cannot have good results with Patches for nicoderm, so will not prescribe again at this point    4.  Willing to try trigger point injections- went over risk and benefits-    5. Patient here for trigger point injections for  Consent done and on chart.  Cleaned areas with alcohol  and injected using a 27 gauge 1.5 inch needle  Injected 1.5cc- wasted 1.5cc-   Using 1% Lidocaine  with no EPI  Upper traps B/L  Levators Posterior scalenes Middle scalenes- B/L -  Splenius Capitus Pectoralis Major Rhomboids Infraspinatus Teres Major/minor Thoracic paraspinals Lumbar paraspinals Other injections-    Patient's level of pain prior was  7/10 Current level of pain after injections is 0/10 now  There was no bleeding or complications.  Patient was advised to drink a lot of water on day after injections to flush system Will have increased soreness for 12-48 hours after injections.  Can use Lidocaine  patches the day AFTER injections Can use theracane on day of injections in places didn't inject Can use heating pad 4-6 hours AFTER injections   6. We discussed how peripheral arterial occlusion is causing the pain in legs-  and we discussed that stopping smoking would help.     6. F/U in 3 months- trp injections and f/u on pain-

## 2024-04-27 ENCOUNTER — Telehealth: Payer: Self-pay | Admitting: *Deleted

## 2024-04-27 NOTE — Telephone Encounter (Signed)
 Triage call for burning leg pain pt states it feels like fire. Which sounds more like a nerve issue. Was able to make a appointment for 05/28/2024 and recommend pt to call PCP back regarding further testing and referral to neurologist. Pt is to call back for further questions or worsening symptoms

## 2024-05-10 ENCOUNTER — Telehealth: Payer: Self-pay

## 2024-05-10 ENCOUNTER — Other Ambulatory Visit: Payer: Self-pay | Admitting: Family Medicine

## 2024-05-10 DIAGNOSIS — J438 Other emphysema: Secondary | ICD-10-CM

## 2024-05-10 NOTE — Telephone Encounter (Signed)
 Patient calls nurse line requesting a referral to Neurology.   She reports she spoke with Vascular and this is their recommendation.   She reports she knows no further information.   Will forward to PCP to place possible referral.   She has an apt on 5/22 with PCP.

## 2024-05-16 ENCOUNTER — Other Ambulatory Visit: Payer: Self-pay | Admitting: *Deleted

## 2024-05-16 DIAGNOSIS — I739 Peripheral vascular disease, unspecified: Secondary | ICD-10-CM

## 2024-05-17 ENCOUNTER — Ambulatory Visit (INDEPENDENT_AMBULATORY_CARE_PROVIDER_SITE_OTHER): Admitting: Family Medicine

## 2024-05-17 ENCOUNTER — Encounter: Payer: Self-pay | Admitting: Family Medicine

## 2024-05-17 VITALS — BP 110/70 | HR 76 | Ht 60.0 in | Wt 137.6 lb

## 2024-05-17 DIAGNOSIS — E1151 Type 2 diabetes mellitus with diabetic peripheral angiopathy without gangrene: Secondary | ICD-10-CM

## 2024-05-17 DIAGNOSIS — Z720 Tobacco use: Secondary | ICD-10-CM | POA: Diagnosis not present

## 2024-05-17 LAB — POCT GLYCOSYLATED HEMOGLOBIN (HGB A1C): HbA1c, POC (controlled diabetic range): 7.5 % — AB (ref 0.0–7.0)

## 2024-05-17 MED ORDER — VARENICLINE TARTRATE 1 MG PO TABS
1.0000 mg | ORAL_TABLET | Freq: Two times a day (BID) | ORAL | 0 refills | Status: DC
Start: 2024-05-17 — End: 2024-05-17

## 2024-05-17 MED ORDER — VARENICLINE TARTRATE 0.5 MG PO TABS: 0.5000 mg | ORAL_TABLET | Freq: Two times a day (BID) | ORAL | 0 refills | Status: DC

## 2024-05-17 MED ORDER — VARENICLINE TARTRATE 1 MG PO TABS
1.0000 mg | ORAL_TABLET | Freq: Every day | ORAL | 0 refills | Status: DC
Start: 2024-05-17 — End: 2024-06-15

## 2024-05-17 NOTE — Progress Notes (Signed)
    SUBJECTIVE:   CHIEF COMPLAINT / HPI:   T2DM  Neuropathy  PAD - Bilateral lower extremity pain remains consistent - Has upcoming appt with [facility] - Went to ED and was given morphine  pills which did not help  - Checks BG at home, most recently in 90s  - No recent dizziness/lightheadedness/falls  Tobacco use - Smokes about a pack per day  - Has chosen a quit date but doesn't want to share with anyone  - Thinks the chantix  may have reduced the pleasure of smoking  - Her PM&R provider started wellbutrin  for her and she likes this plan   PERTINENT  PMH / PSH: PAD, L SFA occlusion, R bypass graft, T2DM, CKD3, HTN, Tobacco use   OBJECTIVE:   BP 110/70   Pulse 76   Ht 5' (1.524 m)   Wt 137 lb 9.6 oz (62.4 kg)   SpO2 96%   BMI 26.87 kg/m   General: No acute distress.  CV: Chronic systolic murmur, W0/J8. No extra heart sounds. Pulm: Breathing comfortably on room air. CTAB. No increased WOB. Skin/Ext:  Diffuse edema of BLE, R > L to level of knee, TTP. No overt necrosis. Intact distal pulses of BLE.    ASSESSMENT/PLAN:   Assessment & Plan T2DM  PAD A1c 7.5 today, well-controlled. BLE exam stable. Suspect patient's pain 2/2 PAD and diabetic neuropathy.  - Cont metformin  1g daily, Trulicity  weekly  - Cont gabapentin  600 nightly, Cymbalta  60 daily - Discussed yearly diabetes eye exam  - Patient to see VSS on 05/28/24 Tobacco use Actively planning to quit smoking.  - Increase to Chantix  1mg  daily - can increase to BID if tolerating well after 1 week  - Cont Wellbutrin  as prescribed  - Encouraged patient in her tobacco cessation journey    Other HM  - Encouraged patient to get Shingrix and Tdap at [facility] pharmacy   Carey Chapman, MD [facility]

## 2024-05-17 NOTE — Assessment & Plan Note (Addendum)
 A1c 7.5 today, well-controlled. BLE exam stable. Suspect patient's pain 2/2 PAD and diabetic neuropathy.  - Cont metformin  1g daily, Trulicity  weekly  - Cont gabapentin  600 nightly, Cymbalta  60 daily - Discussed yearly diabetes eye exam  - Patient to see VSS on 05/28/24

## 2024-05-17 NOTE — Patient Instructions (Addendum)
 Thank you for visiting clinic today and allowing us  to participate in your care!  Today we discussed:  - Please continue taking Chantix  and Buproprion as prescribed.  - You can call [phone] ([phone])---you will be connected with a Careers information officer. They can also mail you nicotine  gums, lozenges, and patches to quit.  - Please go to the eye doctor for your annual diabetes eye exam.  - Please go to your local pharmacy to get your Shingrix and Tdap vaccines.  - Go to your scheduled appointment with Vascular on June 2nd.   Please schedule an appointment in 1 month for continued follow up.    Reach out any time with any questions or concerns you may have - we are here for you!  Carey Chapman, MD [facility] [phone]

## 2024-05-28 ENCOUNTER — Other Ambulatory Visit: Payer: Self-pay | Admitting: Family Medicine

## 2024-05-28 ENCOUNTER — Ambulatory Visit (INDEPENDENT_AMBULATORY_CARE_PROVIDER_SITE_OTHER): Admitting: Physician Assistant

## 2024-05-28 ENCOUNTER — Ambulatory Visit (HOSPITAL_COMMUNITY)
Admission: RE | Admit: 2024-05-28 | Discharge: 2024-05-28 | Disposition: A | Discharge status: Home / Self Care | Source: Ambulatory Visit | Attending: Surgery | Admitting: Surgery

## 2024-05-28 ENCOUNTER — Encounter: Payer: Self-pay | Admitting: Physician Assistant

## 2024-05-28 VITALS — BP 148/75 | HR 64 | Temp 98.6°F | Ht 60.0 in | Wt 137.0 lb

## 2024-05-28 DIAGNOSIS — F17200 Nicotine dependence, unspecified, uncomplicated: Secondary | ICD-10-CM

## 2024-05-28 DIAGNOSIS — I70222 Atherosclerosis of native arteries of extremities with rest pain, left leg: Secondary | ICD-10-CM | POA: Diagnosis not present

## 2024-05-28 DIAGNOSIS — I739 Peripheral vascular disease, unspecified: Secondary | ICD-10-CM

## 2024-05-28 DIAGNOSIS — J438 Other emphysema: Secondary | ICD-10-CM

## 2024-05-28 NOTE — Progress Notes (Signed)
 HISTORY AND PHYSICAL     CC:  follow up. Requesting Provider:  Carey Chapman, MD  HPI: This is a 78 y.o. female who is here today for follow up for PAD.  Pt has hx of right femoral endarterectomy extending into the profundo-femoral artery with vein patch angioplasty and right femoral to BK popliteal bypass with PTFE on 10/04/2020 by Dr. Charlotte Cookey for right leg rest pain.  Pt was last seen 08/22/2023 and at that time, she was not having any debilitating claudication, rest pain or tissue loss. She was still smoking.   The pt returns today for follow up.  She states that she is having terrible pain in the LLE that is present all of the time.  She does not tolerating even the lightest touch of the left foot.  She does not have any wounds on her feet.  She has occasional shooting pain in the right foot.  She does have some numbness in her feet as well.  She continues to smoke but states she has cut back considerably with the help of her PCP.    The pt is on a statin for cholesterol management.    The pt is not on an aspirin .    Other AC:  Plavix  The pt is on CCB, diuretic, ARB for hypertension.  The pt is  on medication for diabetes. Tobacco hx:  current  Pt does not have family hx of AAA.  Past Medical History:  Diagnosis Date   Allergy    environmental   Asthma    Brain tumor (HCC)    Chronic diastolic CHF (congestive heart failure) (HCC)    CKD (chronic kidney disease)    CLL (chronic lymphocytic leukemia) (HCC)    Constipation 11/15/2011   COPD (chronic obstructive pulmonary disease) (HCC)    CVA (cerebral vascular accident) (HCC)    Diabetes (HCC)    GERD (gastroesophageal reflux disease)    Headache    migraines   Hyperlipidemia    Hypertension    Pneumonia    PVD (peripheral vascular disease) (HCC)    Stroke (HCC) 1998   in 1998 due to tumor-right side    Past Surgical History:  Procedure Laterality Date   ABDOMINAL AORTOGRAM W/LOWER EXTREMITY Bilateral 08/12/2020    Procedure: ABDOMINAL AORTOGRAM W/BILATERAL LOWER EXTREMITY RUNOFF;  Surgeon: Margherita Shell, MD;  Location: [facility];  Service: Cardiovascular;  Laterality: Bilateral;   ABI  02/2012   ABI <0.65 BL 02/2012   BRAIN MENINGIOMA EXCISION     BRAIN TUMOR EXCISION     Bypass grafting of RLE for PAD claudication      CARDIAC CATHETERIZATION  04/09/208   CESAREAN SECTION     1974, 78 ,80   CHOLECYSTECTOMY, LAPAROSCOPIC     COLONOSCOPY  2014   [place], [place]   ENDARTERECTOMY FEMORAL Right 10/03/2020   Procedure: RIGHT FEMORAL ENDARTERECTOMY WITH VEIN PATCH ANGIOPLASTY;  Surgeon: Margherita Shell, MD;  Location: [facility];  Service: Vascular;  Laterality: Right;   ESOPHAGEAL DILATION     FEMORAL-POPLITEAL BYPASS GRAFT Right 10/03/2020   Procedure: RIGHT FEMORAL- BELOW KNEE POPLITEAL BYPASS USING GORE PROPATEN GRAFT;  Surgeon: Margherita Shell, MD;  Location: [facility];  Service: Vascular;  Laterality: Right;   PATCH ANGIOPLASTY  10/03/2020   Procedure: VEIN PATCH ANGIOPLASTY;  Surgeon: Margherita Shell, MD;  Location: [facility];  Service: Vascular;;    Allergies  Allergen Reactions   Gazyva  [Obinutuzumab ] Shortness Of Breath  Acute respiratory distress   Aspirin      Abdominal pain.    No [employer] Food Allergies Diarrhea and Nausea And Vomiting    Cabbage, stomach pain    Aspirin  Other (See Comments)    irritates stomach   Baclofen  Other (See Comments)    Stomach irritation    Current Outpatient Medications  Medication Sig Dispense Refill   acetaminophen  (TYLENOL ) 500 MG tablet Take 500-1,000 mg by mouth every 6 (six) hours as needed for moderate pain.     albuterol  (PROVENTIL ) (2.5 MG/3ML) 0.083% nebulizer solution Take 3 mLs (2.5 mg total) by nebulization every 6 (six) hours as needed for wheezing or shortness of breath. 75 mL 11   albuterol  (VENTOLIN  HFA) 108 (90 Base) MCG/ACT inhaler Inhale 2 puffs by mouth into the lungs every 4 hours as needed for wheezing/shortness of breath  8.5 each 11   Alcohol  Swabs (ALCOHOL  PADS) 70 % PADS      amLODipine  (NORVASC ) 10 MG tablet Take 1 tablet by mouth every day 30 tablet 11   atorvastatin  (LIPITOR ) 80 MG tablet Take 1 tablet by mouth daily 30 tablet 11   Blood Glucose Calibration (OT ULTRA/FASTTK CNTRL SOLN) SOLN See admin instructions.     blood glucose meter kit and supplies KIT Dispense based on patient and insurance preference. Use up to four times daily as directed. 1 each 0   Blood Glucose Monitoring Suppl (ACCU-CHEK GUIDE ME) w/Device KIT Use to test blood sugar twice per day. E11.9 1 kit 1   buPROPion  (WELLBUTRIN  XL) 150 MG 24 hr tablet Take 1 tablet (150 mg total) by mouth daily. For smoking cessation 90 tablet 1   cholecalciferol (VITAMIN D3) 25 MCG (1000 UNIT) tablet Take 1,000 Units by mouth once a week.     clopidogrel  (PLAVIX ) 75 MG tablet Take 1 tablet by mouth every day 30 tablet 11   DULoxetine  (CYMBALTA ) 60 MG capsule Take 1 capsule (60 mg total) by mouth daily. 30 capsule 3   empagliflozin  (JARDIANCE ) 25 MG TABS tablet Take 1 tablet (25 mg total) by mouth daily. 30 tablet 4   famotidine  (PEPCID ) 20 MG tablet Take 1 tablet by mouth twice daily (Patient taking differently: Take 20 mg by mouth at bedtime.) 60 tablet 11   Fluticasone -Umeclidin-Vilant (TRELEGY ELLIPTA ) 100-62.5-25 MCG/ACT AEPB Inhale 1 puff into the lungs daily. 60 each 5   furosemide  (LASIX ) 20 MG tablet Take 1 tablet (20 mg total) by mouth daily. 30 tablet 3   gabapentin  (NEURONTIN ) 300 MG capsule Take 2 capsules (600 mg total) by mouth at bedtime. 180 capsule 1   glucose blood (ONETOUCH VERIO) test strip Use to check blood sugar three times daily. 100 each 12   Lancet Devices (EASY MINI EJECT LANCING DEVICE) MISC daily.     Lancets (ONETOUCH DELICA PLUS LANCET33G) MISC USE   TO CHECK GLUCOSE ONCE DAILY (Patient taking differently: USE   TO CHECK GLUCOSE ONCE DAILY) 100 each 0   lidocaine  (LIDODERM ) 5 % Place 1 patch onto the skin daily. Remove &  Discard patch within 12 hours or as directed by MD (Patient taking differently: Place 1 patch onto the skin daily as needed (for pain).) 30 patch 0   losartan  (COZAAR ) 50 MG tablet Take 1 tablet by mouth at bedtime 30 tablet 11   metFORMIN  (GLUCOPHAGE -XR) 500 MG 24 hr tablet Take 2 tabs by mouth daily with breakfast. Need appt 60 tablet 11   methocarbamol  (ROBAXIN ) 750 MG tablet Take 1 tablet by  mouth every 8 hours as needed for muscle spasms 90 tablet 1   montelukast  (SINGULAIR ) 10 MG tablet Take 1 tablet (10 mg total) by mouth at bedtime. 30 tablet 11   morphine  (MSIR) 15 MG tablet Take 0.5 tablets (7.5 mg total) by mouth every 4 (four) hours as needed. 3 tablet 0   nitroGLYCERIN  (NITROSTAT ) 0.4 MG SL tablet Dissolve 1 tablet under tongue every 5 minutes, up to 3 doses for chest pain 25 tablet 3   traMADol  (ULTRAM ) 50 MG tablet Take 1 tablet (50 mg total) by mouth every 12 (twelve) hours as needed for severe pain (pain score 7-10). 60 tablet 5   TRULICITY  0.75 MG/0.5ML SOAJ Inject 0.75 mg subcutaneously once a week 2 mL 11   varenicline  (CHANTIX  CONTINUING MONTH PAK) 1 MG tablet Take 1 tablet (1 mg total) by mouth daily. If tolerating well after a week, increase to twice a day. 60 tablet 0   No current facility-administered medications for this visit.    Family History  Problem Relation Age of Onset   Heart disease Mother    Asthma Mother    Cancer Mother        uterine    Depression Mother    Heart attack Mother 4   Hyperlipidemia Mother    Hypertension Mother    Stroke Mother    Kidney disease Mother    Heart attack Sister    Stroke Sister    Depression Sister    Diabetes Sister    Hyperlipidemia Sister    Depression Sister    Diabetes Sister    Hyperlipidemia Sister    HIV/AIDS Sister    Diabetes Brother    Asthma Brother    Hypertension Brother    Cancer Brother        colon   HIV/AIDS Brother    Heart disease Maternal Grandmother    Heart attack Maternal Grandmother     Cancer Maternal Aunt        lung   Hypertension Daughter    Colon cancer Neg Hx     Social History   Socioeconomic History   Marital status: Single    Spouse name: Not on file   Number of children: 3   Years of education: Not on file   Highest education level: Not on file  Occupational History   Occupation: DISABLED    Employer: DISABLED  Tobacco Use   Smoking status: Every Day    Current packs/day: 0.25    Average packs/day: 0.3 packs/day for 48.4 years (12.1 ttl pk-yrs)    Types: Cigarettes    Start date: 01/01/1976    Passive exposure: Current   Smokeless tobacco: Never   Tobacco comments:    ~ 3 cigarettes / day  Vaping Use   Vaping status: Never Used  Substance and Sexual Activity   Alcohol  use: Not Currently   Drug use: Not Currently   Sexual activity: Yes  Other Topics Concern   Not on file  Social History Narrative   ** Merged History Encounter **       Health Care POA:  Emergency Contact: Zebedee Hibbs [phone] (c) End of Life Plan:  Who lives with you: Lives with husband Any pets: none Diet: Patient lacks financial resources for much food. Pt reports eating what is available. Exercise: Patient    does not have an exercise plan. Seatbelts: Patient reports wearing seatbelt when in vehicle.  Paulene Boron Exposure/Protection: Hobbies: Bowling, computer games, Bingo  Has financial difficulties  and transportation issues as she and her husband share transpo   rtation      Social Drivers of Health   Financial Resource Strain: Low Risk  (03/29/2024)   Overall Financial Resource Strain (CARDIA)    Difficulty of Paying Living Expenses: Not very hard  Food Insecurity: No Food Insecurity (03/29/2024)   Hunger Vital Sign    Worried About Running Out of Food in the Last Year: Never true    Ran Out of Food in the Last Year: Never true  Transportation Needs: No Transportation Needs (03/29/2024)   PRAPARE - Administrator, Civil Service (Medical): No    Lack  of Transportation (Non-Medical): No  Physical Activity: Insufficiently Active (03/29/2024)   Exercise Vital Sign    Days of Exercise per Week: 3 days    Minutes of Exercise per Session: 30 min  Stress: No Stress Concern Present (03/29/2024)   [employer] of Occupational Health - Occupational Stress Questionnaire    Feeling of Stress : Not at all  Social Connections: Moderately Integrated (03/29/2024)   Social Connection and Isolation Panel [NHANES]    Frequency of Communication with Friends and Family: More than three times a week    Frequency of Social Gatherings with Friends and Family: More than three times a week    Attends Religious Services: 1 to 4 times per year    Active Member of [employer] or Organizations: No    Attends Banker Meetings: Never    Marital Status: Married  Catering manager Violence: Not At Risk (03/29/2024)   Humiliation, Afraid, Rape, and Kick questionnaire    Fear of Current or Ex-Partner: No    Emotionally Abused: No    Physically Abused: No    Sexually Abused: No     REVIEW OF SYSTEMS:   [X]  denotes positive finding, [ ]  denotes negative finding Cardiac  Comments:  Chest pain or chest pressure:    Shortness of breath upon exertion:    Short of breath when lying flat:    Irregular heart rhythm:        Vascular    Pain in calf, thigh, or hip brought on by ambulation:    Pain in feet at night that wakes you up from your sleep:  x Right leg and foot  Blood clot in your veins:    Leg swelling:         Pulmonary    Oxygen  at home:    Productive cough:     Wheezing:         Neurologic    Sudden weakness in arms or legs:     Sudden numbness in arms or legs:     Sudden onset of difficulty speaking or slurred speech:    Temporary loss of vision in one eye:     Problems with dizziness:         Gastrointestinal    Blood in stool:     Vomited blood:         Genitourinary    Burning when urinating:     Blood in urine:         Psychiatric    Major depression:         Hematologic    Bleeding problems:    Problems with blood clotting too easily:        Skin    Rashes or ulcers:        Constitutional    Fever or chills:  PHYSICAL EXAMINATION:  Today's Vitals   05/28/24 1354  BP: (!) 148/75  Pulse: 64  Temp: 98.6 F (37 C)  TempSrc: Temporal  SpO2: 93%  Weight: 137 lb (62.1 kg)  Height: 5' (1.524 m)  PainSc: 10-Worst pain ever  PainLoc: Leg   Body mass index is 26.76 kg/m.   General:  WDWN in NAD; vital signs documented above Gait: Not observed HENT: WNL, normocephalic Pulmonary: normal non-labored breathing , without wheezing Cardiac: regular HR, without carotid bruits Abdomen: soft, NT; aortic pulse is not palpable Skin: without rashes Vascular Exam/Pulses:  Right Left  Radial 2+ (normal) 2+ (normal)  Femoral 2+ (normal) 2+ (normal)  DP 1+ (weak) brisk biphasic monophasic  PT Brisk monophasic monophasic  Peroneal Brisk biphasic monophasic   Extremities: does not tolerate light touch to left foot. No wounds present on either foot.  Musculoskeletal: no muscle wasting or atrophy  Neurologic: A&O X 3 Psychiatric:  The pt has Normal affect.   Non-Invasive Vascular Imaging:   ABI's/TBI's on 04/13/2024: Right:  0.98/0.64 - Great toe pressure: 85 Left:  0.66/0.49 - Great toe pressure: 65  Arterial duplex on 05/28/2024: Right Graft #1: Femoral to below knee popliteal artery  +------------------+--------+--------+----------+--------+                   PSV cm/sStenosisWaveform  Comments  +------------------+--------+--------+----------+--------+  Inflow           58              triphasic           +------------------+--------+--------+----------+--------+  Prox Anastomosis  178             monophasic          +------------------+--------+--------+----------+--------+  Proximal Graft    91              monophasic           +------------------+--------+--------+----------+--------+  Mid Graft         36              monophasic          +------------------+--------+--------+----------+--------+  Distal Graft      59              monophasic          +------------------+--------+--------+----------+--------+  Distal Anastomosis154             monophasic          +------------------+--------+--------+----------+--------+  Outflow          124             monophasic          +------------------+--------+--------+----------+--------+    +-----------+--------+-----+-------------+----------+----------------------  LEFT      PSV cm/sRatioStenosis     Waveform  Comments                +-----------+--------+-----+-------------+----------+----------------------  EIA Prox   213          >50% stenosismonophasic                       +-----------+--------+-----+-------------+----------+----------------------  EIA Distal 160                       monophasic                       +-----------+--------+-----+-------------+----------+----------------------  CFA Distal 170  monophasic                       +-----------+--------+-----+-------------+----------+----------------------  DFA       134                       biphasic                         +-----------+--------+-----+-------------+----------+----------------------  SFA Prox   -107         occluded     monophasic Retrograde at the SFA ostium with subsequent occlusion              +-----------+--------+-----+-------------+----------+----------------------  SFA Mid                 occluded                                      +-----------+--------+-----+-------------+----------+----------------------  SFA Distal              occluded                                      +-----------+--------+-----+-------------+----------+----------------------  POP Prox   75                         monophasicReconstitutes via  collateral             +-----------+--------+-----+-------------+----------+----------------------  POP Distal 58                        monophasic                       +-----------+--------+-----+-------------+----------+----------------------  TP Trunk   51                        monophasic                       +-----------+--------+-----+-------------+----------+----------------------  ATA Prox   68                        monophasic                       +-----------+--------+-----+-------------+----------+----------------------  ATA Mid    50                        monophasic                       +-----------+--------+-----+-------------+----------+----------------------  ATA Distal 36                        monophasic                       +-----------+--------+-----+-------------+----------+----------------------  PTA Prox   75                        monophasic                       +-----------+--------+-----+-------------+----------+----------------------  PTA  Mid    53                        monophasic                       +-----------+--------+-----+-------------+----------+----------------------  PTA Distal 54                        monophasic                       +-----------+--------+-----+-------------+----------+----------------------   PERO Prox  33                        monophasic                       +-----------+--------+-----+-------------+----------+----------------------   PERO Mid   36                        monophasic                       +-----------+--------+-----+-------------+----------+----------------------  PERO Distal35                        monophasic                       +-----------+--------+-----+-------------+----------+----------------------  Summary:  Right: Low velocities in the bypass graft could suggest a threatened bypass.   Left:  >50% external iliac stenosis. Total occlusion noted in the superficial femoral artery.    Previous ABI's/TBI's on 08/22/2023: Right:  0.92/0.86 - Great toe pressure: 113 Left:  0.66/0/64 - Great toe pressure:  84  Previous arterial duplex on 08/22/2023: Right Graft #1: fem-pop  +------------------+--------+--------+--------+--------+                   PSV cm/sStenosisWaveformComments  +------------------+--------+--------+--------+--------+  Inflow           67                                +------------------+--------+--------+--------+--------+  Prox Anastomosis  131                               +------------------+--------+--------+--------+--------+  Proximal Graft    112                               +------------------+--------+--------+--------+--------+  Mid Graft         59                                +------------------+--------+--------+--------+--------+  Distal Graft      69                                +------------------+--------+--------+--------+--------+  Distal Anastomosis64                                +------------------+--------+--------+--------+--------+  Outflow          117                               +------------------+--------+--------+--------+--------+  Summary:  Right: Widely patent femoropopliteal bypass     ASSESSMENT/PLAN:: 78 y.o. female here for follow up for PAD with hx of right femoral endarterectomy extending into the profundo-femoral artery with vein patch angioplasty and right femoral to BK popliteal bypass with PTFE on 10/04/2020 by Dr. Charlotte Cookey for right leg rest pain.    -duplex today of the RLE bypass has decreased velocities, which could be suggestive of a threatened bypass.    On the left, she has excruciating pain in the left lower leg.  Duplex today reveals that she has an occluded SFA and the popliteal reconstitutes.    -will arrange for her to have angiogram with BLE  runoff and possible intervention with Dr. Charlotte Cookey:  -On the right - she has threatened bypass with decreased velocities -On the left - she has occluded SFA with rest pain.  She is unable to tolerate touch of the left lower leg.   -continue statin/plavix  (she is unable to take asa)  Current smoker-stressed the importance of smoking cessation especially in light of her being diabetic.  Discussed this puts her at risk of limb loss, heart attack, stroke, cancer, lung problems.  She is working on quitting as she has cut back significantly.    Maryanna Smart, [facility] [phone]  Clinic MD:   Susi Eric on call MD

## 2024-05-28 NOTE — H&P (View-Only) (Signed)
 HISTORY AND PHYSICAL     CC:  follow up. Requesting Provider:  Carey Chapman, MD  HPI: This is a 78 y.o. female who is here today for follow up for PAD.  Pt has hx of right femoral endarterectomy extending into the profundo-femoral artery with vein patch angioplasty and right femoral to BK popliteal bypass with PTFE on 10/04/2020 by Dr. Charlotte Cookey for right leg rest pain.  Pt was last seen 08/22/2023 and at that time, she was not having any debilitating claudication, rest pain or tissue loss. She was still smoking.   The pt returns today for follow up.  She states that she is having terrible pain in the LLE that is present all of the time.  She does not tolerating even the lightest touch of the left foot.  She does not have any wounds on her feet.  She has occasional shooting pain in the right foot.  She does have some numbness in her feet as well.  She continues to smoke but states she has cut back considerably with the help of her PCP.    The pt is on a statin for cholesterol management.    The pt is not on an aspirin .    Other AC:  Plavix  The pt is on CCB, diuretic, ARB for hypertension.  The pt is  on medication for diabetes. Tobacco hx:  current  Pt does not have family hx of AAA.  Past Medical History:  Diagnosis Date   Allergy    environmental   Asthma    Brain tumor (HCC)    Chronic diastolic CHF (congestive heart failure) (HCC)    CKD (chronic kidney disease)    CLL (chronic lymphocytic leukemia) (HCC)    Constipation 11/15/2011   COPD (chronic obstructive pulmonary disease) (HCC)    CVA (cerebral vascular accident) (HCC)    Diabetes (HCC)    GERD (gastroesophageal reflux disease)    Headache    migraines   Hyperlipidemia    Hypertension    Pneumonia    PVD (peripheral vascular disease) (HCC)    Stroke (HCC) 1998   in 1998 due to tumor-right side    Past Surgical History:  Procedure Laterality Date   ABDOMINAL AORTOGRAM W/LOWER EXTREMITY Bilateral 08/12/2020    Procedure: ABDOMINAL AORTOGRAM W/BILATERAL LOWER EXTREMITY RUNOFF;  Surgeon: Margherita Shell, MD;  Location: [facility];  Service: Cardiovascular;  Laterality: Bilateral;   ABI  02/2012   ABI <0.65 BL 02/2012   BRAIN MENINGIOMA EXCISION     BRAIN TUMOR EXCISION     Bypass grafting of RLE for PAD claudication      CARDIAC CATHETERIZATION  04/09/208   CESAREAN SECTION     1974, 78 ,80   CHOLECYSTECTOMY, LAPAROSCOPIC     COLONOSCOPY  2014   [place], [place]   ENDARTERECTOMY FEMORAL Right 10/03/2020   Procedure: RIGHT FEMORAL ENDARTERECTOMY WITH VEIN PATCH ANGIOPLASTY;  Surgeon: Margherita Shell, MD;  Location: [facility];  Service: Vascular;  Laterality: Right;   ESOPHAGEAL DILATION     FEMORAL-POPLITEAL BYPASS GRAFT Right 10/03/2020   Procedure: RIGHT FEMORAL- BELOW KNEE POPLITEAL BYPASS USING GORE PROPATEN GRAFT;  Surgeon: Margherita Shell, MD;  Location: [facility];  Service: Vascular;  Laterality: Right;   PATCH ANGIOPLASTY  10/03/2020   Procedure: VEIN PATCH ANGIOPLASTY;  Surgeon: Margherita Shell, MD;  Location: [facility];  Service: Vascular;;    Allergies  Allergen Reactions   Gazyva  [Obinutuzumab ] Shortness Of Breath  Acute respiratory distress   Aspirin      Abdominal pain.    No [employer] Food Allergies Diarrhea and Nausea And Vomiting    Cabbage, stomach pain    Aspirin  Other (See Comments)    irritates stomach   Baclofen  Other (See Comments)    Stomach irritation    Current Outpatient Medications  Medication Sig Dispense Refill   acetaminophen  (TYLENOL ) 500 MG tablet Take 500-1,000 mg by mouth every 6 (six) hours as needed for moderate pain.     albuterol  (PROVENTIL ) (2.5 MG/3ML) 0.083% nebulizer solution Take 3 mLs (2.5 mg total) by nebulization every 6 (six) hours as needed for wheezing or shortness of breath. 75 mL 11   albuterol  (VENTOLIN  HFA) 108 (90 Base) MCG/ACT inhaler Inhale 2 puffs by mouth into the lungs every 4 hours as needed for wheezing/shortness of breath  8.5 each 11   Alcohol  Swabs (ALCOHOL  PADS) 70 % PADS      amLODipine  (NORVASC ) 10 MG tablet Take 1 tablet by mouth every day 30 tablet 11   atorvastatin  (LIPITOR ) 80 MG tablet Take 1 tablet by mouth daily 30 tablet 11   Blood Glucose Calibration (OT ULTRA/FASTTK CNTRL SOLN) SOLN See admin instructions.     blood glucose meter kit and supplies KIT Dispense based on patient and insurance preference. Use up to four times daily as directed. 1 each 0   Blood Glucose Monitoring Suppl (ACCU-CHEK GUIDE ME) w/Device KIT Use to test blood sugar twice per day. E11.9 1 kit 1   buPROPion  (WELLBUTRIN  XL) 150 MG 24 hr tablet Take 1 tablet (150 mg total) by mouth daily. For smoking cessation 90 tablet 1   cholecalciferol (VITAMIN D3) 25 MCG (1000 UNIT) tablet Take 1,000 Units by mouth once a week.     clopidogrel  (PLAVIX ) 75 MG tablet Take 1 tablet by mouth every day 30 tablet 11   DULoxetine  (CYMBALTA ) 60 MG capsule Take 1 capsule (60 mg total) by mouth daily. 30 capsule 3   empagliflozin  (JARDIANCE ) 25 MG TABS tablet Take 1 tablet (25 mg total) by mouth daily. 30 tablet 4   famotidine  (PEPCID ) 20 MG tablet Take 1 tablet by mouth twice daily (Patient taking differently: Take 20 mg by mouth at bedtime.) 60 tablet 11   Fluticasone -Umeclidin-Vilant (TRELEGY ELLIPTA ) 100-62.5-25 MCG/ACT AEPB Inhale 1 puff into the lungs daily. 60 each 5   furosemide  (LASIX ) 20 MG tablet Take 1 tablet (20 mg total) by mouth daily. 30 tablet 3   gabapentin  (NEURONTIN ) 300 MG capsule Take 2 capsules (600 mg total) by mouth at bedtime. 180 capsule 1   glucose blood (ONETOUCH VERIO) test strip Use to check blood sugar three times daily. 100 each 12   Lancet Devices (EASY MINI EJECT LANCING DEVICE) MISC daily.     Lancets (ONETOUCH DELICA PLUS LANCET33G) MISC USE   TO CHECK GLUCOSE ONCE DAILY (Patient taking differently: USE   TO CHECK GLUCOSE ONCE DAILY) 100 each 0   lidocaine  (LIDODERM ) 5 % Place 1 patch onto the skin daily. Remove &  Discard patch within 12 hours or as directed by MD (Patient taking differently: Place 1 patch onto the skin daily as needed (for pain).) 30 patch 0   losartan  (COZAAR ) 50 MG tablet Take 1 tablet by mouth at bedtime 30 tablet 11   metFORMIN  (GLUCOPHAGE -XR) 500 MG 24 hr tablet Take 2 tabs by mouth daily with breakfast. Need appt 60 tablet 11   methocarbamol  (ROBAXIN ) 750 MG tablet Take 1 tablet by  mouth every 8 hours as needed for muscle spasms 90 tablet 1   montelukast  (SINGULAIR ) 10 MG tablet Take 1 tablet (10 mg total) by mouth at bedtime. 30 tablet 11   morphine  (MSIR) 15 MG tablet Take 0.5 tablets (7.5 mg total) by mouth every 4 (four) hours as needed. 3 tablet 0   nitroGLYCERIN  (NITROSTAT ) 0.4 MG SL tablet Dissolve 1 tablet under tongue every 5 minutes, up to 3 doses for chest pain 25 tablet 3   traMADol  (ULTRAM ) 50 MG tablet Take 1 tablet (50 mg total) by mouth every 12 (twelve) hours as needed for severe pain (pain score 7-10). 60 tablet 5   TRULICITY  0.75 MG/0.5ML SOAJ Inject 0.75 mg subcutaneously once a week 2 mL 11   varenicline  (CHANTIX  CONTINUING MONTH PAK) 1 MG tablet Take 1 tablet (1 mg total) by mouth daily. If tolerating well after a week, increase to twice a day. 60 tablet 0   No current facility-administered medications for this visit.    Family History  Problem Relation Age of Onset   Heart disease Mother    Asthma Mother    Cancer Mother        uterine    Depression Mother    Heart attack Mother 4   Hyperlipidemia Mother    Hypertension Mother    Stroke Mother    Kidney disease Mother    Heart attack Sister    Stroke Sister    Depression Sister    Diabetes Sister    Hyperlipidemia Sister    Depression Sister    Diabetes Sister    Hyperlipidemia Sister    HIV/AIDS Sister    Diabetes Brother    Asthma Brother    Hypertension Brother    Cancer Brother        colon   HIV/AIDS Brother    Heart disease Maternal Grandmother    Heart attack Maternal Grandmother     Cancer Maternal Aunt        lung   Hypertension Daughter    Colon cancer Neg Hx     Social History   Socioeconomic History   Marital status: Single    Spouse name: Not on file   Number of children: 3   Years of education: Not on file   Highest education level: Not on file  Occupational History   Occupation: DISABLED    Employer: DISABLED  Tobacco Use   Smoking status: Every Day    Current packs/day: 0.25    Average packs/day: 0.3 packs/day for 48.4 years (12.1 ttl pk-yrs)    Types: Cigarettes    Start date: 01/01/1976    Passive exposure: Current   Smokeless tobacco: Never   Tobacco comments:    ~ 3 cigarettes / day  Vaping Use   Vaping status: Never Used  Substance and Sexual Activity   Alcohol  use: Not Currently   Drug use: Not Currently   Sexual activity: Yes  Other Topics Concern   Not on file  Social History Narrative   ** Merged History Encounter **       Health Care POA:  Emergency Contact: Zebedee Hibbs [phone] (c) End of Life Plan:  Who lives with you: Lives with husband Any pets: none Diet: Patient lacks financial resources for much food. Pt reports eating what is available. Exercise: Patient    does not have an exercise plan. Seatbelts: Patient reports wearing seatbelt when in vehicle.  Paulene Boron Exposure/Protection: Hobbies: Bowling, computer games, Bingo  Has financial difficulties  and transportation issues as she and her husband share transpo   rtation      Social Drivers of Health   Financial Resource Strain: Low Risk  (03/29/2024)   Overall Financial Resource Strain (CARDIA)    Difficulty of Paying Living Expenses: Not very hard  Food Insecurity: No Food Insecurity (03/29/2024)   Hunger Vital Sign    Worried About Running Out of Food in the Last Year: Never true    Ran Out of Food in the Last Year: Never true  Transportation Needs: No Transportation Needs (03/29/2024)   PRAPARE - Administrator, Civil Service (Medical): No    Lack  of Transportation (Non-Medical): No  Physical Activity: Insufficiently Active (03/29/2024)   Exercise Vital Sign    Days of Exercise per Week: 3 days    Minutes of Exercise per Session: 30 min  Stress: No Stress Concern Present (03/29/2024)   [employer] of Occupational Health - Occupational Stress Questionnaire    Feeling of Stress : Not at all  Social Connections: Moderately Integrated (03/29/2024)   Social Connection and Isolation Panel [NHANES]    Frequency of Communication with Friends and Family: More than three times a week    Frequency of Social Gatherings with Friends and Family: More than three times a week    Attends Religious Services: 1 to 4 times per year    Active Member of [employer] or Organizations: No    Attends Banker Meetings: Never    Marital Status: Married  Catering manager Violence: Not At Risk (03/29/2024)   Humiliation, Afraid, Rape, and Kick questionnaire    Fear of Current or Ex-Partner: No    Emotionally Abused: No    Physically Abused: No    Sexually Abused: No     REVIEW OF SYSTEMS:   [X]  denotes positive finding, [ ]  denotes negative finding Cardiac  Comments:  Chest pain or chest pressure:    Shortness of breath upon exertion:    Short of breath when lying flat:    Irregular heart rhythm:        Vascular    Pain in calf, thigh, or hip brought on by ambulation:    Pain in feet at night that wakes you up from your sleep:  x Right leg and foot  Blood clot in your veins:    Leg swelling:         Pulmonary    Oxygen  at home:    Productive cough:     Wheezing:         Neurologic    Sudden weakness in arms or legs:     Sudden numbness in arms or legs:     Sudden onset of difficulty speaking or slurred speech:    Temporary loss of vision in one eye:     Problems with dizziness:         Gastrointestinal    Blood in stool:     Vomited blood:         Genitourinary    Burning when urinating:     Blood in urine:         Psychiatric    Major depression:         Hematologic    Bleeding problems:    Problems with blood clotting too easily:        Skin    Rashes or ulcers:        Constitutional    Fever or chills:  PHYSICAL EXAMINATION:  Today's Vitals   05/28/24 1354  BP: (!) 148/75  Pulse: 64  Temp: 98.6 F (37 C)  TempSrc: Temporal  SpO2: 93%  Weight: 137 lb (62.1 kg)  Height: 5' (1.524 m)  PainSc: 10-Worst pain ever  PainLoc: Leg   Body mass index is 26.76 kg/m.   General:  WDWN in NAD; vital signs documented above Gait: Not observed HENT: WNL, normocephalic Pulmonary: normal non-labored breathing , without wheezing Cardiac: regular HR, without carotid bruits Abdomen: soft, NT; aortic pulse is not palpable Skin: without rashes Vascular Exam/Pulses:  Right Left  Radial 2+ (normal) 2+ (normal)  Femoral 2+ (normal) 2+ (normal)  DP 1+ (weak) brisk biphasic monophasic  PT Brisk monophasic monophasic  Peroneal Brisk biphasic monophasic   Extremities: does not tolerate light touch to left foot. No wounds present on either foot.  Musculoskeletal: no muscle wasting or atrophy  Neurologic: A&O X 3 Psychiatric:  The pt has Normal affect.   Non-Invasive Vascular Imaging:   ABI's/TBI's on 04/13/2024: Right:  0.98/0.64 - Great toe pressure: 85 Left:  0.66/0.49 - Great toe pressure: 65  Arterial duplex on 05/28/2024: Right Graft #1: Femoral to below knee popliteal artery  +------------------+--------+--------+----------+--------+                   PSV cm/sStenosisWaveform  Comments  +------------------+--------+--------+----------+--------+  Inflow           58              triphasic           +------------------+--------+--------+----------+--------+  Prox Anastomosis  178             monophasic          +------------------+--------+--------+----------+--------+  Proximal Graft    91              monophasic           +------------------+--------+--------+----------+--------+  Mid Graft         36              monophasic          +------------------+--------+--------+----------+--------+  Distal Graft      59              monophasic          +------------------+--------+--------+----------+--------+  Distal Anastomosis154             monophasic          +------------------+--------+--------+----------+--------+  Outflow          124             monophasic          +------------------+--------+--------+----------+--------+    +-----------+--------+-----+-------------+----------+----------------------  LEFT      PSV cm/sRatioStenosis     Waveform  Comments                +-----------+--------+-----+-------------+----------+----------------------  EIA Prox   213          >50% stenosismonophasic                       +-----------+--------+-----+-------------+----------+----------------------  EIA Distal 160                       monophasic                       +-----------+--------+-----+-------------+----------+----------------------  CFA Distal 170  monophasic                       +-----------+--------+-----+-------------+----------+----------------------  DFA       134                       biphasic                         +-----------+--------+-----+-------------+----------+----------------------  SFA Prox   -107         occluded     monophasic Retrograde at the SFA ostium with subsequent occlusion              +-----------+--------+-----+-------------+----------+----------------------  SFA Mid                 occluded                                      +-----------+--------+-----+-------------+----------+----------------------  SFA Distal              occluded                                      +-----------+--------+-----+-------------+----------+----------------------  POP Prox   75                         monophasicReconstitutes via  collateral             +-----------+--------+-----+-------------+----------+----------------------  POP Distal 58                        monophasic                       +-----------+--------+-----+-------------+----------+----------------------  TP Trunk   51                        monophasic                       +-----------+--------+-----+-------------+----------+----------------------  ATA Prox   68                        monophasic                       +-----------+--------+-----+-------------+----------+----------------------  ATA Mid    50                        monophasic                       +-----------+--------+-----+-------------+----------+----------------------  ATA Distal 36                        monophasic                       +-----------+--------+-----+-------------+----------+----------------------  PTA Prox   75                        monophasic                       +-----------+--------+-----+-------------+----------+----------------------  PTA  Mid    53                        monophasic                       +-----------+--------+-----+-------------+----------+----------------------  PTA Distal 54                        monophasic                       +-----------+--------+-----+-------------+----------+----------------------   PERO Prox  33                        monophasic                       +-----------+--------+-----+-------------+----------+----------------------   PERO Mid   36                        monophasic                       +-----------+--------+-----+-------------+----------+----------------------  PERO Distal35                        monophasic                       +-----------+--------+-----+-------------+----------+----------------------  Summary:  Right: Low velocities in the bypass graft could suggest a threatened bypass.   Left:  >50% external iliac stenosis. Total occlusion noted in the superficial femoral artery.    Previous ABI's/TBI's on 08/22/2023: Right:  0.92/0.86 - Great toe pressure: 113 Left:  0.66/0/64 - Great toe pressure:  84  Previous arterial duplex on 08/22/2023: Right Graft #1: fem-pop  +------------------+--------+--------+--------+--------+                   PSV cm/sStenosisWaveformComments  +------------------+--------+--------+--------+--------+  Inflow           67                                +------------------+--------+--------+--------+--------+  Prox Anastomosis  131                               +------------------+--------+--------+--------+--------+  Proximal Graft    112                               +------------------+--------+--------+--------+--------+  Mid Graft         59                                +------------------+--------+--------+--------+--------+  Distal Graft      69                                +------------------+--------+--------+--------+--------+  Distal Anastomosis64                                +------------------+--------+--------+--------+--------+  Outflow          117                               +------------------+--------+--------+--------+--------+  Summary:  Right: Widely patent femoropopliteal bypass     ASSESSMENT/PLAN:: 78 y.o. female here for follow up for PAD with hx of right femoral endarterectomy extending into the profundo-femoral artery with vein patch angioplasty and right femoral to BK popliteal bypass with PTFE on 10/04/2020 by Dr. Charlotte Cookey for right leg rest pain.    -duplex today of the RLE bypass has decreased velocities, which could be suggestive of a threatened bypass.    On the left, she has excruciating pain in the left lower leg.  Duplex today reveals that she has an occluded SFA and the popliteal reconstitutes.    -will arrange for her to have angiogram with BLE  runoff and possible intervention with Dr. Charlotte Cookey:  -On the right - she has threatened bypass with decreased velocities -On the left - she has occluded SFA with rest pain.  She is unable to tolerate touch of the left lower leg.   -continue statin/plavix  (she is unable to take asa)  Current smoker-stressed the importance of smoking cessation especially in light of her being diabetic.  Discussed this puts her at risk of limb loss, heart attack, stroke, cancer, lung problems.  She is working on quitting as she has cut back significantly.    Maryanna Smart, [facility] [phone]  Clinic MD:   Susi Eric on call MD

## 2024-05-29 ENCOUNTER — Telehealth: Payer: Self-pay

## 2024-05-29 ENCOUNTER — Other Ambulatory Visit: Payer: Self-pay | Admitting: Family Medicine

## 2024-05-29 NOTE — Telephone Encounter (Signed)
 Chart reviewed. Rx refilled.

## 2024-05-29 NOTE — Telephone Encounter (Signed)
 Attempted to call for surgery scheduling. No answer / No VM.

## 2024-05-29 NOTE — Telephone Encounter (Signed)
 Attempted to call for surgery scheduling. "Your call cannot be completed as dialed"  x2. Will attempt other numbers on file.

## 2024-05-30 ENCOUNTER — Other Ambulatory Visit: Payer: Self-pay

## 2024-05-30 DIAGNOSIS — I739 Peripheral vascular disease, unspecified: Secondary | ICD-10-CM

## 2024-06-04 ENCOUNTER — Other Ambulatory Visit: Payer: Self-pay

## 2024-06-04 DIAGNOSIS — M545 Low back pain, unspecified: Secondary | ICD-10-CM

## 2024-06-05 ENCOUNTER — Other Ambulatory Visit: Payer: Self-pay

## 2024-06-05 ENCOUNTER — Encounter (HOSPITAL_COMMUNITY)
Admission: RE | Disposition: A | Discharge status: Home / Self Care | Payer: Self-pay | Source: Ambulatory Visit | Attending: Surgery

## 2024-06-05 ENCOUNTER — Ambulatory Visit (HOSPITAL_COMMUNITY)
Admission: RE | Admit: 2024-06-05 | Discharge: 2024-06-05 | Disposition: A | Discharge status: Home / Self Care | Source: Ambulatory Visit | Attending: Surgery | Admitting: Surgery

## 2024-06-05 DIAGNOSIS — I130 Hypertensive heart and chronic kidney disease with heart failure and stage 1 through stage 4 chronic kidney disease, or unspecified chronic kidney disease: Secondary | ICD-10-CM | POA: Insufficient documentation

## 2024-06-05 DIAGNOSIS — Z95820 Peripheral vascular angioplasty status with implants and grafts: Secondary | ICD-10-CM | POA: Diagnosis not present

## 2024-06-05 DIAGNOSIS — E1122 Type 2 diabetes mellitus with diabetic chronic kidney disease: Secondary | ICD-10-CM | POA: Insufficient documentation

## 2024-06-05 DIAGNOSIS — I2510 Atherosclerotic heart disease of native coronary artery without angina pectoris: Secondary | ICD-10-CM

## 2024-06-05 DIAGNOSIS — I739 Peripheral vascular disease, unspecified: Secondary | ICD-10-CM

## 2024-06-05 DIAGNOSIS — Z7902 Long term (current) use of antithrombotics/antiplatelets: Secondary | ICD-10-CM | POA: Diagnosis not present

## 2024-06-05 DIAGNOSIS — Z7985 Long-term (current) use of injectable non-insulin antidiabetic drugs: Secondary | ICD-10-CM | POA: Insufficient documentation

## 2024-06-05 DIAGNOSIS — I5032 Chronic diastolic (congestive) heart failure: Secondary | ICD-10-CM | POA: Diagnosis not present

## 2024-06-05 DIAGNOSIS — E1151 Type 2 diabetes mellitus with diabetic peripheral angiopathy without gangrene: Secondary | ICD-10-CM | POA: Diagnosis present

## 2024-06-05 DIAGNOSIS — I70223 Atherosclerosis of native arteries of extremities with rest pain, bilateral legs: Secondary | ICD-10-CM | POA: Insufficient documentation

## 2024-06-05 DIAGNOSIS — E785 Hyperlipidemia, unspecified: Secondary | ICD-10-CM | POA: Insufficient documentation

## 2024-06-05 DIAGNOSIS — Z7984 Long term (current) use of oral hypoglycemic drugs: Secondary | ICD-10-CM | POA: Diagnosis not present

## 2024-06-05 DIAGNOSIS — F17210 Nicotine dependence, cigarettes, uncomplicated: Secondary | ICD-10-CM | POA: Insufficient documentation

## 2024-06-05 DIAGNOSIS — I70203 Unspecified atherosclerosis of native arteries of extremities, bilateral legs: Secondary | ICD-10-CM

## 2024-06-05 DIAGNOSIS — Z79899 Other long term (current) drug therapy: Secondary | ICD-10-CM | POA: Insufficient documentation

## 2024-06-05 DIAGNOSIS — Z9889 Other specified postprocedural states: Secondary | ICD-10-CM

## 2024-06-05 DIAGNOSIS — Z95828 Presence of other vascular implants and grafts: Secondary | ICD-10-CM

## 2024-06-05 DIAGNOSIS — N189 Chronic kidney disease, unspecified: Secondary | ICD-10-CM | POA: Insufficient documentation

## 2024-06-05 HISTORY — PX: LOWER EXTREMITY ANGIOGRAPHY: CATH118251

## 2024-06-05 HISTORY — PX: ABDOMINAL AORTOGRAM W/LOWER EXTREMITY: CATH118223

## 2024-06-05 LAB — POCT I-STAT, CHEM 8
BUN: 8 mg/dL (ref 8–23)
Calcium, Ion: 1.16 mmol/L (ref 1.15–1.40)
Chloride: 105 mmol/L (ref 98–111)
Creatinine, Ser: 1.3 mg/dL — ABNORMAL HIGH (ref 0.44–1.00)
Glucose, Bld: 131 mg/dL — ABNORMAL HIGH (ref 70–99)
HCT: 43 % (ref 36.0–46.0)
Hemoglobin: 14.6 g/dL (ref 12.0–15.0)
Potassium: 4.1 mmol/L (ref 3.5–5.1)
Sodium: 144 mmol/L (ref 135–145)
TCO2: 27 mmol/L (ref 22–32)

## 2024-06-05 LAB — GLUCOSE, CAPILLARY: Glucose-Capillary: 124 mg/dL — ABNORMAL HIGH (ref 70–99)

## 2024-06-05 SURGERY — ABDOMINAL AORTOGRAM W/LOWER EXTREMITY
Anesthesia: LOCAL

## 2024-06-05 MED ORDER — FENTANYL CITRATE (PF) 100 MCG/2ML IJ SOLN
INTRAMUSCULAR | Status: AC
Start: 2024-06-05 — End: ?
  Filled 2024-06-05: qty 2

## 2024-06-05 MED ORDER — MIDAZOLAM HCL 2 MG/2ML IJ SOLN
INTRAMUSCULAR | Status: DC | PRN
  Administered 2024-06-05: 2 mg via INTRAVENOUS

## 2024-06-05 MED ORDER — HYDRALAZINE HCL 20 MG/ML IJ SOLN
5.0000 mg | INTRAMUSCULAR | Status: DC | PRN
  Administered 2024-06-05: 5 mg via INTRAVENOUS
  Filled 2024-06-05: qty 1

## 2024-06-05 MED ORDER — SODIUM CHLORIDE 0.9% FLUSH: 3.0000 mL | Freq: Two times a day (BID) | INTRAVENOUS | Status: DC

## 2024-06-05 MED ORDER — LIDOCAINE HCL (PF) 1 % IJ SOLN
INTRAMUSCULAR | Status: AC
  Filled 2024-06-05: qty 30

## 2024-06-05 MED ORDER — ONDANSETRON HCL 4 MG/2ML IJ SOLN: 4.0000 mg | Freq: Four times a day (QID) | INTRAMUSCULAR | Status: DC | PRN

## 2024-06-05 MED ORDER — SODIUM CHLORIDE 0.9 % IV SOLN: 250.0000 mL | INTRAVENOUS | Status: DC | PRN

## 2024-06-05 MED ORDER — SODIUM CHLORIDE 0.9 % IV SOLN: INTRAVENOUS | Status: DC

## 2024-06-05 MED ORDER — SODIUM CHLORIDE 0.9% FLUSH: 3.0000 mL | INTRAVENOUS | Status: DC | PRN

## 2024-06-05 MED ORDER — SODIUM CHLORIDE 0.9 % WEIGHT BASED INFUSION: 1.0000 mL/kg/h | INTRAVENOUS | Status: DC

## 2024-06-05 MED ORDER — MORPHINE SULFATE (PF) 2 MG/ML IV SOLN: 2.0000 mg | INTRAVENOUS | Status: DC | PRN

## 2024-06-05 MED ORDER — OXYCODONE HCL 5 MG PO TABS: 5.0000 mg | ORAL_TABLET | ORAL | Status: DC | PRN

## 2024-06-05 MED ORDER — LABETALOL HCL 5 MG/ML IV SOLN: 10.0000 mg | INTRAVENOUS | Status: DC | PRN

## 2024-06-05 MED ORDER — IODIXANOL 320 MG/ML IV SOLN
INTRAVENOUS | Status: DC | PRN
  Administered 2024-06-05: 60 mL

## 2024-06-05 MED ORDER — MIDAZOLAM HCL 2 MG/2ML IJ SOLN
INTRAMUSCULAR | Status: AC
  Filled 2024-06-05: qty 2

## 2024-06-05 MED ORDER — HEPARIN (PORCINE) IN NACL 1000-0.9 UT/500ML-% IV SOLN
INTRAVENOUS | Status: DC | PRN
  Administered 2024-06-05 (×2): 500 mL

## 2024-06-05 MED ORDER — ACETAMINOPHEN 325 MG PO TABS
650.0000 mg | ORAL_TABLET | ORAL | Status: DC | PRN
Start: 2024-06-05 — End: 2024-06-05

## 2024-06-05 MED ORDER — LIDOCAINE HCL (PF) 1 % IJ SOLN
INTRAMUSCULAR | Status: DC | PRN
  Administered 2024-06-05: 15 mL via INTRADERMAL

## 2024-06-05 MED ORDER — FENTANYL CITRATE (PF) 100 MCG/2ML IJ SOLN
INTRAMUSCULAR | Status: DC | PRN
Start: 2024-06-05 — End: 2024-06-05
  Administered 2024-06-05: 50 ug via INTRAVENOUS

## 2024-06-05 SURGICAL SUPPLY — 11 items
CATH OMNI FLUSH 5F 65CM (CATHETERS) IMPLANT
COVER DOME SNAP 22 D (MISCELLANEOUS) IMPLANT
DEVICE VASC CLSR CELT ART 5 (Vascular Products) IMPLANT
GLIDEWIRE ADV .035X260CM (WIRE) IMPLANT
KIT MICROPUNCTURE NIT STIFF (SHEATH) IMPLANT
KIT SINGLE USE MANIFOLD (KITS) IMPLANT
SET ATX-X65L (MISCELLANEOUS) IMPLANT
SHEATH PINNACLE 5F 10CM (SHEATH) IMPLANT
SHEATH PROBE COVER 6X72 (BAG) IMPLANT
TRAY PV CATH (CUSTOM PROCEDURE TRAY) ×1 IMPLANT
WIRE BENTSON .035X145CM (WIRE) IMPLANT

## 2024-06-05 NOTE — Progress Notes (Signed)
 States does not know when took last dose of medications and states does not know what takes

## 2024-06-05 NOTE — Progress Notes (Signed)
 Patient walked to the bathroom without difficulties

## 2024-06-05 NOTE — Op Note (Signed)
Patient name: [name] [name] MRN: [id number] DOB: 1946-12-11 Sex: female  06/05/2024 Pre-operative Diagnosis: Lateral leg pain Post-operative diagnosis:  Same Surgeon:  [name] Procedure Performed:  1.  Ultrasound-guided, right femoral artery  2.  Abdominal aortogram  3.  Bilateral lower extremity bilateral  4.  Selective injection with catheter in left external iliac artery from right femoral artery  5.  Conscious sedation, 19 minutes  6.  Closure of eyes, Celt    Indications: This is a 78 year old with history of right femoral-popliteal bypass graft who is complaining of bilateral leg pain.  Ultrasound showed occluded superficial femoral artery on the left.  She is here for further evaluation  Procedure:  The patient was identified in the holding area and taken to room 8.  The patient was then placed supine on the table and prepped and draped in the usual sterile fashion.  A time out was called.  Conscious sedation was administered with the use of IV fentanyl  and Versed  under continuous physician and nurse monitoring.  Heart rate, blood pressure, and oxygen  saturation were continuously monitored.  Total sedation time was 19 minutes.  Ultrasound was used to evaluate the right common femoral artery.  It was patent .  A digital ultrasound image was acquired.  A micropuncture needle was used to access the right common femoral artery under ultrasound guidance.  An 018 wire was advanced without resistance and a micropuncture sheath was placed.  The 018 wire was removed and a benson wire was placed.  The micropuncture sheath was exchanged for a 5 french sheath.  An omniflush catheter was advanced over the wire to the level of L-1.  An abdominal angiogram was obtained.  Next, using the omniflush catheter and a benson wire, the aortic bifurcation was crossed and the catheter was placed into theleft external iliac artery and left runoff was obtained.  right runoff was performed via retrograde  sheath injections.  The groin was closed with a Celt  Findings:   Aortogram: No significant renal artery stenosis.  The infrarenal abdominal aorta is widely patent.  Bilateral common iliac and external iliac arteries are widely patent including the right iliac stent.  Right Lower Extremity: Right common femoral and profundofemoral artery are widely patent with patulous dilatation of the common femoral artery consistent with prior patch angioplasty.  There is a bypass graft in the common femoral artery to the below-knee popliteal artery which is widely patent.  There is mild luminal narrowing maybe 30% at the distal anastomosis gnosis.  There is 3-vessel runoff  Left Lower Extremity: The left common femoral artery is patent however it is distally stent there is luminal narrowing of approximately 50 to 60%.  The profundofemoral artery is widely patent.  The superficial femoral artery is occluded just beyond its origin reconstitution of the above-knee popliteal artery.  There is three-vessel  Intervention:  none  Impression:  #1  Widely patent right femoral-popliteal bypass graft  #2  Occlusion of the left superficial femoral artery with reconstitution of the above-knee popliteal artery.  There is also distal common femoral stenosis about 50 to 60%.  #3  Angiographic findings did not support her complaints of bilateral leg pain and so alternative etiologies will be looked into.  If she were to develop wound issues on the left she would be a candidate for left femoral endarterectomy and femoral to above-knee popliteal bypass graft   V. [name], M.D., [facility] [place] Office: [phone] Pager:  [phone]  

## 2024-06-05 NOTE — Interval H&P Note (Signed)
 History and Physical Interval Note:  06/05/2024 8:37 AM  [name] [name]  has presented today for surgery, with the diagnosis of cad.  The various methods of treatment have been discussed with the patient and family. After consideration of risks, benefits and other options for treatment, the patient has consented to  Procedure(s): ABDOMINAL AORTOGRAM W/LOWER EXTREMITY (N/A) Lower Extremity Angiography (N/A) LOWER EXTREMITY INTERVENTION (N/A) as a surgical intervention.  The patient's history has been reviewed, patient examined, no change in status, stable for surgery.  I have reviewed the patient's chart and labs.  Questions were answered to the patient's satisfaction.     [name]

## 2024-06-05 NOTE — Discharge Instructions (Signed)
 Femoral Site Care This sheet gives you information about how to care for yourself after your procedure. Your health care provider may also give you more specific instructions. If you have problems or questions, contact your health care provider. What can I expect after the procedure?  After the procedure, it is common to have: Bruising that usually fades within 1-2 weeks. Tenderness at the site. Follow these instructions at home: Wound care Follow instructions from your health care provider about how to take care of your insertion site. Make sure you: Wash your hands with soap and water before you change your bandage (dressing). If soap and water are not available, use hand sanitizer. Remove your dressing as told by your health care provider. In 24 hours Do not take baths, swim, or use a hot tub until your health care provider approves. You may shower 24-48 hours after the procedure or as told by your health care provider. Gently wash the site with plain soap and water. Pat the area dry with a clean towel. Do not rub the site. This may cause bleeding. Do not apply powder or lotion to the site. Keep the site clean and dry. Check your femoral site every day for signs of infection. Check for: Redness, swelling, or pain. Fluid or blood. Warmth. Pus or a bad smell. Activity For the first 2-3 days after your procedure, or as long as directed: Avoid climbing stairs as much as possible. Do not squat. Do not lift anything that is heavier than 10 lb (4.5 kg), or the limit that you are told, until your health care provider says that it is safe. For 5 days Rest as directed. Avoid sitting for a long time without moving. Get up to take short walks every 1-2 hours. Do not drive for 24 hours if you were given a medicine to help you relax (sedative). General instructions Take over-the-counter and prescription medicines only as told by your health care provider. Keep all follow-up visits as told by  your health care provider. This is important. Contact a health care provider if you have: A fever or chills. You have redness, swelling, or pain around your insertion site. Get help right away if: The catheter insertion area swells very fast. You pass out. You suddenly start to sweat or your skin gets clammy. The catheter insertion area is bleeding, and the bleeding does not stop when you hold steady pressure on the area. The area near or just beyond the catheter insertion site becomes pale, cool, tingly, or numb. These symptoms may represent a serious problem that is an emergency. Do not wait to see if the symptoms will go away. Get medical help right away. Call your local emergency services (911 in the [place] Do not drive yourself to the hospital. Summary After the procedure, it is common to have bruising that usually fades within 1-2 weeks. Check your femoral site every day for signs of infection. Do not lift anything that is heavier than 10 lb (4.5 kg), or the limit that you are told, until your health care provider says that it is safe. This information is not intended to replace advice given to you by your health care provider. Make sure you discuss any questions you have with your health care provider. Document Revised: 12/26/2017 Document Reviewed: 12/26/2017 Elsevier Patient Education  2020 [employer].

## 2024-06-06 ENCOUNTER — Other Ambulatory Visit: Payer: Self-pay

## 2024-06-06 ENCOUNTER — Other Ambulatory Visit: Payer: Self-pay | Admitting: Family Medicine

## 2024-06-06 ENCOUNTER — Encounter (HOSPITAL_COMMUNITY): Payer: Self-pay | Admitting: Surgery

## 2024-06-06 DIAGNOSIS — M79604 Pain in right leg: Secondary | ICD-10-CM

## 2024-06-06 DIAGNOSIS — M545 Low back pain, unspecified: Secondary | ICD-10-CM

## 2024-06-06 NOTE — Progress Notes (Signed)
 Notified by patient's VSS team recommending Neurology referral for further leg pain assessment.

## 2024-06-07 ENCOUNTER — Other Ambulatory Visit: Payer: Self-pay | Admitting: Family Medicine

## 2024-06-07 NOTE — Telephone Encounter (Signed)
 Chart reviewed. Rx refilled.

## 2024-06-07 NOTE — Telephone Encounter (Signed)
 Received call from [facility] regarding rx request for tramadol .   They report that patient has not had rx filled since 12/06/2023. While they had existing refills, these will expire at the end of June.   They are asking if patient is still supposed to be receiving medication.   Advised that patient has PCP follow up visit on 06/15/2024 in which medication can be discussed further.   If patient is to remain on medication, they will need a new prescription as refills would have expired.   Forwarding to PCP.   Elsie Halo, RN

## 2024-06-09 ENCOUNTER — Other Ambulatory Visit: Payer: Self-pay | Admitting: Family Medicine

## 2024-06-11 NOTE — Telephone Encounter (Signed)
 Chart reviewed. Rx refilled. Plan to discuss pain regimen at upcoming PCP appt 6/20.

## 2024-06-12 ENCOUNTER — Encounter: Payer: Self-pay | Admitting: Hematology

## 2024-06-15 ENCOUNTER — Ambulatory Visit (INDEPENDENT_AMBULATORY_CARE_PROVIDER_SITE_OTHER): Admitting: Family Medicine

## 2024-06-15 ENCOUNTER — Encounter: Payer: Self-pay | Admitting: Family Medicine

## 2024-06-15 VITALS — BP 118/70 | HR 66 | Ht 62.0 in | Wt 135.5 lb

## 2024-06-15 DIAGNOSIS — Z720 Tobacco use: Secondary | ICD-10-CM | POA: Diagnosis not present

## 2024-06-15 MED ORDER — NICOTINE 14 MG/24HR TD PT24: 14.0000 mg | MEDICATED_PATCH | Freq: Every day | TRANSDERMAL | 1 refills | Status: DC

## 2024-06-15 MED ORDER — VARENICLINE TARTRATE 1 MG PO TABS: 1.0000 mg | ORAL_TABLET | Freq: Every day | ORAL | 1 refills | Status: DC

## 2024-06-15 NOTE — Progress Notes (Signed)
    SUBJECTIVE:   CHIEF COMPLAINT / HPI:   Smoking cessation - Feels overall going well - Taking Wellbutrin  and chantax once daily  - Down to 3-5 cigarettes per day  - Reduced cravings - Feels urge to smoke when seeing others smoking - Would like to reconnect with Dr Koval for continued smoking cessation  - Does not find gum or lozenges helpful  - Open to trying nicotine  patches  PERTINENT  PMH / PSH: Tobacco use, COPD, PAD, HTN, CKD3  OBJECTIVE:   BP 118/70   Pulse 66   Ht 5' 2 (1.575 m)   Wt 135 lb 8 oz (61.5 kg)   SpO2 100%   BMI 24.78 kg/m   General: No acute distress. Resting comfortably in room. CV: S1/S2. Warm. Pulm: Breathing comfortably on room air. CTAB anteriorly. No increased WOB. Skin:  Warm, dry. Psych: Pleasant and appropriate.    ASSESSMENT/PLAN:   Assessment & Plan Tobacco use Progressing in smoking cessation journey. Motivated to quit.  - Cont wellbutrin  150 daily  - Discussed increasing Chantix  1 to twice daily  - Discussed starting nicotine  patch  - Appt scheduled with Dr Amalia    Future Appointments  Date Time Provider Department Center  06/19/2024  3:30 PM Amalia Maude MATSU, RPH-CPP [facility]  08/06/2024 10:20 AM Cornelio Bouchard, MD [facility]  08/13/2024  9:30 AM [facility] None  08/13/2024 10:00 AM Onesimo Emaline Brink, MD [facility] None  10/15/2024 10:30 AM Margaret, Eduard SAUNDERS, MD [facility] None  04/01/2025  1:40 PM [facility] ANNUAL WELLNESS VISIT [facility]    Damien Cassis, MD [facility]

## 2024-06-15 NOTE — Patient Instructions (Signed)
 Thank you for visiting clinic today and allowing us  to participate in your care!  Great job advancing on your quitting smoking journey! Please continue taking the Wellbutrin  and Chantix  to help. I have also sent for nicotine  patches you can wear to help if you would like to try them.   You are scheduled to meet with our pharmacist Dr Koval soon to discuss your options further:   06/19/2024  3:30 PM Koval, Peter G, RPH-CPP   Reach out any time with any questions or concerns you may have - we are here for you!  Carey Chapman, MD [facility] [phone]

## 2024-06-19 ENCOUNTER — Telehealth (INDEPENDENT_AMBULATORY_CARE_PROVIDER_SITE_OTHER): Admitting: Pharmacist

## 2024-06-19 ENCOUNTER — Encounter: Payer: Self-pay | Admitting: Pharmacist

## 2024-06-19 DIAGNOSIS — F17210 Nicotine dependence, cigarettes, uncomplicated: Secondary | ICD-10-CM

## 2024-06-19 MED ORDER — VARENICLINE TARTRATE 1 MG PO TABS: 1.0000 mg | ORAL_TABLET | Freq: Every day | ORAL | 1 refills | Status: DC

## 2024-06-19 NOTE — Assessment & Plan Note (Signed)
 Tobacco use disorder with mild-moderate nicotine  dependence history who has decreased smoking of the last 15-20 years to approximately 6 cigarettes per day.  Patient interested in using varenicline  to help with continued efforts at intake reduction with goal of quitting in the next 2 months.  Patient who is fair candidate for success because of progress in the past and an extended period of quitting - 2 weeks.    - Continued bupropion  150mg  XL daily.  - Re-Initiated varenicline  1 mg daily for 1 week then increase to 1 mg BID. Patient counseled on purpose, proper use, and potential adverse effects, including GI upset. - Agreed on goal to reduce intake to 3 or less in the next month -Provided information on 1 800-QUIT NOW support program.

## 2024-06-19 NOTE — Progress Notes (Signed)
   S:   Chief Complaint  Patient presents with   Medication Management    Tobacco Cessation   78 y.o. female visit conduct via caregility (virtual visit) - rescheduled to virtual at patient request due to hot weather and gas shortage (transportation issue)  Patient connected from Home I connect from [facility]  Patient is in good spirits.    Patient was referred and last seen by Primary Care Provider, Dr. Diona, on 06/15/2024.  At last visit, patient expressed interest in long-term tobacco cessation.   Max intake per day reported as 1 ppd for many years Longest quit in the past reported as ~ 2 weeks - relapsed due to other smokers in her environment.  Current smoking reported as ~6 cigarettes per day   Medications used in past cessation efforts include: NRT, bupropion , varenicline  (denied any GI side effects with prior use/ at any dose including 1mg  BID)  Rates IMPORTANCE of quitting tobacco as high. Rates CONFIDENCE of quitting tobacco as high.   O:  Review of Systems  Respiratory:  Positive for shortness of breath.     Physical Exam  Psychiatric:        Mood and Affect: Mood normal.        Thought Content: Thought content normal.        Judgment: Judgment normal.    A/P: Tobacco use disorder with mild-moderate nicotine  dependence history who has decreased smoking of the last 15-20 years to approximately 6 cigarettes per day.  Patient interested in using varenicline  to help with continued efforts at intake reduction with goal of quitting in the next 2 months.  Patient who is fair candidate for success because of progress in the past and an extended period of quitting - 2 weeks.    - Continued bupropion  150mg  XL daily.  - Re-Initiated varenicline  1 mg daily for 1 week then increase to 1 mg BID. Patient counseled on purpose, proper use, and potential adverse effects, including GI upset. - Agreed on goal to reduce intake to 3 or less in the next month -Provided  information on 1 800-QUIT NOW support program.    Written patient instructions provided. Patient verbalized understanding of treatment plan.  Total time in face to face counseling 21 minutes.    Follow-up:  Pharmacist 1 month - patient to schedule (virtual visit approved) PCP clinic visit PRN

## 2024-06-19 NOTE — Patient Instructions (Signed)
 Nice to see you today in our virtual visit!   Medication Changes: - RE start varenicline  1 mg daily for 1 week then increase to 1 mg BID. Patient counseled on purpose, proper use, and potential adverse effects, including GI upset. - Goal to reduce intake to 3 or less in the next month - Contact 1 800-QUIT NOW for additional support    - Continue all other medication the same.   Tobacco Patient Instructions  Quitting smoking is one of the most important decisions you can make for your current and future health. Consider what you dislike about smoking and how quitting could personally benefit you. Try to cut down.     Starting today, Be a Quitter!  Remind yourself why you want to quit.  Delay your first cigarette of the day for as long as possible.  Start cleaning out all pockets, drawers, and your car of cigarettes.  Getting Through the Cravings Once You Are Smoke Free: Each craving will last about 10 minutes, whether or not you smoke. Here's how to get through the cravings without cigarettes:  DELAY: Tell yourself that you'll wait for the next craving. Do it every time! DEEP BREATHS: One reason smoking feels good is because you breathe in deeply to inhale. Take four slow, deep breaths and feel the relaxation without the hamful effects of cigarettes. DRINK WATER: Drink a glass of cool water. It will give your hands and mouth something to do and will help flush the nicotine  out of your system faster. DIVERT: Do something else -- brush your teeth, take a walk, call a friend who can offer you support. Just moving onto something other than thinking about cigarettes will move you through the craving.   Frequently Asked Questions  What can I do when I get the urge to smoke? To get through the urge to smoke, try the following:  Review your reasons for quitting and think of all the benefits to your health, your finances, and your family.  Remind yourself that there is no such thing as just  one cigarette -- or even one puff.  Ride out the desire to smoke. Use the 4 Os -- Delay, Deep Breaths, Drink Water and Divert to get you through. The craving will go away eventually. Do not fool yourself into thinking you can have just one cigarette.  Any tips on how to deal with stress? Stress is a natural part of life. The key is to deal with it without reaching for a cigarette. Taking deep breaths, counting backwards from 10 and asking yourself 1-how big a deal is this?"  Writing down your feelings, talking with a friend and doing things like positive self-talk and meditation are some other ways that people deal with daily stress.  What if I start smoking again? Slips happen. Most people try to quit smoking a few times before they are successful. Don't beat yourself up if this happens to you! Ask yourself if this was a slip or a relapse. A slip is a one-time mistake that is quickly corrected. A relapse is going back to your old smoking habits.   If you slip, don't give up. Think of it as a learning experience. Ask yourself what went wrong and renew your commitment to staying away from smoking for good.  If you relapse, try not to get discouraged. Ask yourself the question "What caused me to start smoking?" Figure out what helped you and what didn't when you tried to quit. Knowing why you  relapsed is useful information for your next attempt to quit.  Please bring all medications to your clinic visits.  Please arrive 10-15 minutes prior to your scheduled visit time.

## 2024-06-20 NOTE — Progress Notes (Signed)
 Reviewed and agree with Dr Macky Lower plan.

## 2024-07-05 ENCOUNTER — Other Ambulatory Visit: Payer: Self-pay | Admitting: Family Medicine

## 2024-07-05 NOTE — Telephone Encounter (Signed)
 Chart reviewed. Rx refilled.

## 2024-07-14 ENCOUNTER — Other Ambulatory Visit: Payer: Self-pay | Admitting: Family Medicine

## 2024-07-16 NOTE — Telephone Encounter (Signed)
 Called patient to check in on pain regimen. Patient feels the 750mg  robaxin  works better than the previous 500mg . Reports needing to use robaxin  up to twice a day. No recent falls. Discussed risks of falls with robaxin  once again. Patient elects to continue with 750mg  robaxin  dosage. Rx refilled. Requesting patient to be seen back in clinic in 1-2 months, patient to call and schedule.

## 2024-07-23 ENCOUNTER — Other Ambulatory Visit: Payer: Self-pay | Admitting: Family Medicine

## 2024-07-23 DIAGNOSIS — I5032 Chronic diastolic (congestive) heart failure: Secondary | ICD-10-CM

## 2024-07-23 DIAGNOSIS — R0789 Other chest pain: Secondary | ICD-10-CM

## 2024-07-24 NOTE — Telephone Encounter (Signed)
 Chart reviewed. Rx refilled. Plan to fu on ferritin at next visit.

## 2024-08-02 ENCOUNTER — Other Ambulatory Visit: Payer: Self-pay | Admitting: Physical Medicine and Rehabilitation

## 2024-08-03 ENCOUNTER — Telehealth: Payer: Self-pay | Admitting: Family Medicine

## 2024-08-03 NOTE — Telephone Encounter (Signed)
 Patient returns call to nurse line. Scheduled for 08/15/24.  Chiquita JAYSON English, RN

## 2024-08-03 NOTE — Telephone Encounter (Signed)
 Called to schedule 1-2 month follow up. LVM to call back.   Thanks!

## 2024-08-06 ENCOUNTER — Encounter: Payer: Self-pay | Admitting: Physical Medicine and Rehabilitation

## 2024-08-06 ENCOUNTER — Encounter: Attending: Physical Medicine and Rehabilitation | Admitting: Physical Medicine and Rehabilitation

## 2024-08-06 VITALS — BP 127/68 | HR 65 | Ht 62.0 in | Wt 135.4 lb

## 2024-08-06 DIAGNOSIS — M4807 Spinal stenosis, lumbosacral region: Secondary | ICD-10-CM | POA: Insufficient documentation

## 2024-08-06 DIAGNOSIS — M7918 Myalgia, other site: Secondary | ICD-10-CM | POA: Diagnosis not present

## 2024-08-06 DIAGNOSIS — M62838 Other muscle spasm: Secondary | ICD-10-CM | POA: Diagnosis present

## 2024-08-06 DIAGNOSIS — F17210 Nicotine dependence, cigarettes, uncomplicated: Secondary | ICD-10-CM | POA: Diagnosis present

## 2024-08-06 MED ORDER — LIDOCAINE HCL 1 % IJ SOLN
3.0000 mL | Freq: Once | INTRAMUSCULAR | Status: AC
  Administered 2024-08-06 (×2): 3 mL

## 2024-08-06 NOTE — Patient Instructions (Signed)
 Plan: Patient here for trigger point injections for  Consent done and on chart.  Cleaned areas with alcohol  and injected using a 27 gauge 1.5 inch needle  Injected 1cc- wasted 2cc Using 1% Lidocaine  with no EPI  Upper traps- L Only Levators- L Only Posterior scalenes Middle scalenes- L only Splenius Capitus Pectoralis Major Rhomboids Infraspinatus Teres Major/minor Thoracic paraspinals Lumbar paraspinals Other injections-    Already feeling better with ROM.    There was no bleeding or complications.  Patient was advised to drink a lot of water on day after injections to flush system Will have increased soreness for 12-48 hours after injections.  Can use Lidocaine  patches the day AFTER injections Can use theracane on day of injections in places didn't inject Can use heating pad 4-6 hours AFTER injections  2. Con't Wellbutrin - for smoking reduction   3. We discussed ways to reduce amount of cigarettes- but really sounds like she's doing great.   4. Next time pt wants to add Lumbar paraspinal which is low back muscles, not bones!   5. Doesn't need refills of Gabapentin - 600 mg nightly-   6. F/U in 3 months- for F/u on pain and trigger point injections.

## 2024-08-06 NOTE — Progress Notes (Signed)
 Patient is a 78 yr old female with hx of dCHF, DM 2- A1c 7.3 , COPD?, CLL with prior  WBC >100k- now 7.5, and chronic back and R>>>L foot pain. Has chronic low back and RLE>LLE DM neuropathy. Also has CKD3- Cr 1.39-  Still a smoker- 0.5 ppd.  Here for f/u on Nerve pain   TrP  injections were helpful- really liked them.  Lasted until 07/27/24- lasted 3 months.    Still smoking- not as much-  1/2 ppd - was smoking 1ppd prior to Wellbutrin - still on it.  Also on Chantix - started end of June- no side effects of that!  Chewing regular gum- to try and stop from smoking- is proud of herself.   Husband smokes 1ppd.      Plan: Patient here for trigger point injections for  Consent done and on chart.  Cleaned areas with alcohol  and injected using a 27 gauge 1.5 inch needle  Injected 1cc- wasted 2cc Using 1% Lidocaine  with no EPI  Upper traps- L Only Levators- L Only Posterior scalenes Middle scalenes- L only Splenius Capitus Pectoralis Major Rhomboids Infraspinatus Teres Major/minor Thoracic paraspinals Lumbar paraspinals Other injections-    Already feeling better with ROM.    There was no bleeding or complications.  Patient was advised to drink a lot of water on day after injections to flush system Will have increased soreness for 12-48 hours after injections.  Can use Lidocaine  patches the day AFTER injections Can use theracane on day of injections in places didn't inject Can use heating pad 4-6 hours AFTER injections  2. Con't Wellbutrin - for smoking reduction   3. We discussed ways to reduce amount of cigarettes- but really sounds like she's doing great.   4. Next time pt wants to add Lumbar paraspinal which is low back muscles, not bones!   5. Doesn't need refills of Gabapentin - 600 mg nightly-   6. F/U in 3 months- for F/u on pain and trigger point injections.

## 2024-08-10 ENCOUNTER — Other Ambulatory Visit: Payer: Self-pay

## 2024-08-10 DIAGNOSIS — C9110 Chronic lymphocytic leukemia of B-cell type not having achieved remission: Secondary | ICD-10-CM

## 2024-08-13 ENCOUNTER — Inpatient Hospital Stay: Payer: 59 | Admitting: Hematology

## 2024-08-13 ENCOUNTER — Telehealth: Payer: Self-pay | Admitting: Hematology

## 2024-08-13 ENCOUNTER — Inpatient Hospital Stay: Payer: 59

## 2024-08-15 ENCOUNTER — Ambulatory Visit (INDEPENDENT_AMBULATORY_CARE_PROVIDER_SITE_OTHER): Admitting: Family Medicine

## 2024-08-15 VITALS — BP 116/62 | HR 63 | Wt 138.2 lb

## 2024-08-15 DIAGNOSIS — Z1211 Encounter for screening for malignant neoplasm of colon: Secondary | ICD-10-CM

## 2024-08-15 DIAGNOSIS — Z720 Tobacco use: Secondary | ICD-10-CM

## 2024-08-15 DIAGNOSIS — E1151 Type 2 diabetes mellitus with diabetic peripheral angiopathy without gangrene: Secondary | ICD-10-CM | POA: Diagnosis not present

## 2024-08-15 DIAGNOSIS — D126 Benign neoplasm of colon, unspecified: Secondary | ICD-10-CM

## 2024-08-15 DIAGNOSIS — Z0000 Encounter for general adult medical examination without abnormal findings: Secondary | ICD-10-CM

## 2024-08-15 LAB — POCT GLYCOSYLATED HEMOGLOBIN (HGB A1C): HbA1c, POC (controlled diabetic range): 7.6 % — AB (ref 0.0–7.0)

## 2024-08-15 NOTE — Patient Instructions (Addendum)
 Thank you for visiting clinic today and allowing us  to participate in your care!  - Your A1c was in a good spot today. Please continue taking all your medications as is.   -Please call [facility] at [phone] to schedule your colonoscopy.  Location: [address]  -Keep up the great work with quitting smoking! Call our office any time to schedule an appointment with Dr Koval.   Please schedule an appointment in 3 months for routine follow up.   Reach out any time with any questions or concerns you may have - we are here for you!  Damien Cassis, MD [facility] [phone]

## 2024-08-15 NOTE — Assessment & Plan Note (Addendum)
 A1c well-controlled today at 7.9.  -Continue current medications: Jardiance  25 qd, Metformin  1000 qd, Trulicity  weekly -Discussed and placed referral to ophthalmology for annual diabetic eye exam - patient reports seeing eye doctor though unclear if optometry or ophtho

## 2024-08-15 NOTE — Progress Notes (Signed)
    SUBJECTIVE:   CHIEF COMPLAINT / HPI:   Tobacco cessation -Reports doing well overall -Down to 2-3 cigarettes per day -Remains motivated to quit   PERTINENT  PMH / PSH: Tobacco use, T2DM, HTN, COPD, CKD3  OBJECTIVE:   BP 116/62   Pulse 63   Wt 138 lb 3.2 oz (62.7 kg)   SpO2 96%   BMI 25.28 kg/m   General: No acute distress. Resting comfortably in room. CV: S1/S2. No extra heart sounds. Warm and well-perfused. Pulm: Breathing comfortably on room air. No increased WOB. Psych: Pleasant and appropriate.   ASSESSMENT/PLAN:   Assessment & Plan Tobacco use Progressing well towards goal. Remains motivated. -Congratulated and encouraged patient in her journey  -Continue current regimen: Chantix  1 BID, Wellbutrin  150 every day -Patient to schedule appt with Dr Koval at her earliest convenience  DM (diabetes mellitus), type 2 with peripheral vascular complications (HCC) A1c well-controlled today at 7.9.  -Continue current medications: Jardiance  25 qd, Metformin  1000 qd, Trulicity  weekly -Discussed and placed referral to ophthalmology for annual diabetic eye exam - patient reports seeing eye doctor though unclear if optometry or ophtho Colon cancer screening Tubular adenoma of colon Patient due for screening, has not yet completed. Discussed and provided patient with contact info to schedule colonoscopy that was previously ordered. Last colonoscopy in 2019 with polyps, with recommended repeat colonoscopy 5 years later.  Healthcare maintenance Discussed receiving shingles vaccine at local pharmacy.    RTC in 3 months.   Damien Cassis, MD [facility]

## 2024-08-17 ENCOUNTER — Inpatient Hospital Stay: Attending: Hematology

## 2024-08-17 ENCOUNTER — Inpatient Hospital Stay (HOSPITAL_BASED_OUTPATIENT_CLINIC_OR_DEPARTMENT_OTHER): Admitting: Hematology

## 2024-08-17 VITALS — BP 123/69 | HR 66 | Temp 97.3°F | Resp 18 | Wt 141.9 lb

## 2024-08-17 DIAGNOSIS — R918 Other nonspecific abnormal finding of lung field: Secondary | ICD-10-CM | POA: Insufficient documentation

## 2024-08-17 DIAGNOSIS — F17210 Nicotine dependence, cigarettes, uncomplicated: Secondary | ICD-10-CM | POA: Insufficient documentation

## 2024-08-17 DIAGNOSIS — Z800 Family history of malignant neoplasm of digestive organs: Secondary | ICD-10-CM | POA: Diagnosis not present

## 2024-08-17 DIAGNOSIS — C9110 Chronic lymphocytic leukemia of B-cell type not having achieved remission: Secondary | ICD-10-CM | POA: Diagnosis present

## 2024-08-17 DIAGNOSIS — Z801 Family history of malignant neoplasm of trachea, bronchus and lung: Secondary | ICD-10-CM | POA: Insufficient documentation

## 2024-08-17 DIAGNOSIS — Z808 Family history of malignant neoplasm of other organs or systems: Secondary | ICD-10-CM | POA: Insufficient documentation

## 2024-08-17 DIAGNOSIS — Z79899 Other long term (current) drug therapy: Secondary | ICD-10-CM | POA: Diagnosis not present

## 2024-08-17 LAB — CMP (CANCER CENTER ONLY)
ALT: 20 U/L (ref 0–44)
AST: 25 U/L (ref 15–41)
Albumin: 4.1 g/dL (ref 3.5–5.0)
Alkaline Phosphatase: 152 U/L — ABNORMAL HIGH (ref 38–126)
Anion gap: 4 — ABNORMAL LOW (ref 5–15)
BUN: 10 mg/dL (ref 8–23)
CO2: 31 mmol/L (ref 22–32)
Calcium: 8.6 mg/dL — ABNORMAL LOW (ref 8.9–10.3)
Chloride: 108 mmol/L (ref 98–111)
Creatinine: 1.29 mg/dL — ABNORMAL HIGH (ref 0.44–1.00)
GFR, Estimated: 43 mL/min — ABNORMAL LOW (ref >60)
Glucose, Bld: 142 mg/dL — ABNORMAL HIGH (ref 70–99)
Potassium: 4.5 mmol/L (ref 3.5–5.1)
Sodium: 143 mmol/L (ref 135–145)
Total Bilirubin: 0.3 mg/dL (ref 0.0–1.2)
Total Protein: 5.9 g/dL — ABNORMAL LOW (ref 6.5–8.1)

## 2024-08-17 LAB — CBC WITH DIFFERENTIAL (CANCER CENTER ONLY)
Basophils Absolute: 0 K/uL (ref 0.0–0.1)
Basophils Relative: 0 %
Eosinophils Absolute: 0 K/uL (ref 0.0–0.5)
Eosinophils Relative: 0 %
HCT: 40.9 % (ref 36.0–46.0)
Hemoglobin: 12.8 g/dL (ref 12.0–15.0)
Lymphocytes Relative: 69 %
Lymphs Abs: 12.6 K/uL — ABNORMAL HIGH (ref 0.7–4.0)
MCH: 28.1 pg (ref 26.0–34.0)
MCHC: 31.3 g/dL (ref 30.0–36.0)
MCV: 89.9 fL (ref 80.0–100.0)
Monocytes Absolute: 1.1 K/uL — ABNORMAL HIGH (ref 0.1–1.0)
Monocytes Relative: 6 %
Neutro Abs: 4.6 K/uL (ref 1.7–7.7)
Neutrophils Relative %: 25 %
Platelet Count: 165 K/uL (ref 150–400)
RBC: 4.55 MIL/uL (ref 3.87–5.11)
RDW: 19.4 % — ABNORMAL HIGH (ref 11.5–15.5)
Smear Review: NORMAL
WBC Count: 18.3 K/uL — ABNORMAL HIGH (ref 4.0–10.5)
nRBC: 0.1 % (ref 0.0–0.2)

## 2024-08-17 LAB — LACTATE DEHYDROGENASE: LDH: 460 U/L — ABNORMAL HIGH (ref 98–192)

## 2024-08-21 ENCOUNTER — Other Ambulatory Visit: Payer: Self-pay | Admitting: *Deleted

## 2024-08-22 MED ORDER — BUPROPION HCL ER (XL) 150 MG PO TB24: 150.0000 mg | ORAL_TABLET | Freq: Every day | ORAL | 1 refills | Status: DC

## 2024-08-22 NOTE — Telephone Encounter (Signed)
 Chart reviewed. Rx refilled.

## 2024-08-23 ENCOUNTER — Encounter: Payer: Self-pay | Admitting: Hematology

## 2024-08-23 NOTE — Progress Notes (Signed)
 HEMATOLOGY/ONCOLOGY CLINIC NOTE  Date of Service: .08/17/2024  Patient Care Team: Diona Perkins, MD as PCP - [name] Shlomo Wilbert SAUNDERS, MD as PCP - Cardiology (Cardiology) Onesimo Emaline Brink, MD as Consulting Physician (Hematology) [place], [place], [place] as Referring Physician (Optometry) Abran Norleen SAILOR, MD as Consulting Physician (Gastroenterology)  CHIEF COMPLAINTS/PURPOSE OF CONSULTATION:  Follow-up for continued evaluation and management of CLL  HISTORY OF PRESENTING ILLNESS:  Please see previous note for details on initial presentation  INTERVAL HISTORY:  [name] [name] is a wonderful 78 year old female who is here for continued evaluation and management of CLL.  This is a 4-month follow-up since her last clinic visit. She notes no acute new symptoms. No fevers no chills no night sweats no unexpected weight loss. No new chest pain or shortness of breath.  No new abdominal pain or distention. She is still smoking about 5 to 10 cigarettes a day. Labs showed some increase in lymphocyte count suggestive of CLL asymptomatic progression at this time.   MEDICAL HISTORY:  Past Medical History:  Diagnosis Date   Allergy    environmental   Asthma    Brain tumor (HCC)    Chronic diastolic CHF (congestive heart failure) (HCC)    CKD (chronic kidney disease)    CLL (chronic lymphocytic leukemia) (HCC)    Constipation 11/15/2011   COPD (chronic obstructive pulmonary disease) (HCC)    CVA (cerebral vascular accident) (HCC)    Diabetes (HCC)    GERD (gastroesophageal reflux disease)    Headache    migraines   Hyperlipidemia    Hypertension    Pneumonia    PVD (peripheral vascular disease) (HCC)    Stroke (HCC) 1998   in 1998 due to tumor-right side    SURGICAL HISTORY: Past Surgical History:  Procedure Laterality Date   ABDOMINAL AORTOGRAM W/LOWER EXTREMITY Bilateral 08/12/2020   Procedure: ABDOMINAL AORTOGRAM W/BILATERAL LOWER EXTREMITY RUNOFF;  Surgeon: Serene Gaile ORN,  MD;  Location: [facility];  Service: Cardiovascular;  Laterality: Bilateral;   ABDOMINAL AORTOGRAM W/LOWER EXTREMITY N/A 06/05/2024   Procedure: ABDOMINAL AORTOGRAM W/LOWER EXTREMITY;  Surgeon: Serene Gaile ORN, MD;  Location: [facility];  Service: Cardiovascular;  Laterality: N/A;   ABI  02/2012   ABI <0.65 BL 02/2012   BRAIN MENINGIOMA EXCISION     BRAIN TUMOR EXCISION     Bypass grafting of RLE for PAD claudication      CARDIAC CATHETERIZATION  04/09/208   CESAREAN SECTION     1974, 78 ,80   CHOLECYSTECTOMY, LAPAROSCOPIC     COLONOSCOPY  2014   [place], [place]   ENDARTERECTOMY FEMORAL Right 10/03/2020   Procedure: RIGHT FEMORAL ENDARTERECTOMY WITH VEIN PATCH ANGIOPLASTY;  Surgeon: Serene Gaile ORN, MD;  Location: [facility];  Service: Vascular;  Laterality: Right;   ESOPHAGEAL DILATION     FEMORAL-POPLITEAL BYPASS GRAFT Right 10/03/2020   Procedure: RIGHT FEMORAL- BELOW KNEE POPLITEAL BYPASS USING GORE PROPATEN GRAFT;  Surgeon: Serene Gaile ORN, MD;  Location: [facility];  Service: Vascular;  Laterality: Right;   LOWER EXTREMITY ANGIOGRAPHY N/A 06/05/2024   Procedure: Lower Extremity Angiography;  Surgeon: Serene Gaile ORN, MD;  Location: [facility];  Service: Cardiovascular;  Laterality: N/A;   PATCH ANGIOPLASTY  10/03/2020   Procedure: VEIN PATCH ANGIOPLASTY;  Surgeon: Serene Gaile ORN, MD;  Location: [facility];  Service: Vascular;;    SOCIAL HISTORY: Social History   Socioeconomic History   Marital status: Single  Spouse name: Not on file   Number of children: 3   Years of education: Not on file   Highest education level: Not on file  Occupational History   Occupation: DISABLED    Employer: DISABLED  Tobacco Use   Smoking status: Every Day    Current packs/day: 0.25    Average packs/day: 0.3 packs/day for 48.6 years (12.2 ttl pk-yrs)    Types: Cigarettes    Start date: 01/01/1976    Passive exposure: Current   Smokeless tobacco: Never   Tobacco comments:    ~  3 cigarettes / day  Vaping Use   Vaping status: Never Used  Substance and Sexual Activity   Alcohol  use: Not Currently   Drug use: Not Currently   Sexual activity: Yes  Other Topics Concern   Not on file  Social History Narrative   ** Merged History Encounter **       Health Care POA:  Emergency Contact: Jamee Brakeman [phone] (c) End of Life Plan:  Who lives with you: Lives with husband Any pets: none Diet: Patient lacks financial resources for much food. Pt reports eating what is available. Exercise: Patient    does not have an exercise plan. Seatbelts: Patient reports wearing seatbelt when in vehicle.  [place] Exposure/Protection: Hobbies: Bowling, computer games, Bingo  Has financial difficulties and transportation issues as she and her husband share transpo   rtation      Social Drivers of Health   Financial Resource Strain: Low Risk  (03/29/2024)   Overall Financial Resource Strain (CARDIA)    Difficulty of Paying Living Expenses: Not very hard  Food Insecurity: No Food Insecurity (03/29/2024)   Hunger Vital Sign    Worried About Running Out of Food in the Last Year: Never true    Ran Out of Food in the Last Year: Never true  Transportation Needs: No Transportation Needs (03/29/2024)   PRAPARE - Administrator, Civil Service (Medical): No    Lack of Transportation (Non-Medical): No  Physical Activity: Insufficiently Active (03/29/2024)   Exercise Vital Sign    Days of Exercise per Week: 3 days    Minutes of Exercise per Session: 30 min  Stress: No Stress Concern Present (03/29/2024)   [employer] of Occupational Health - Occupational Stress Questionnaire    Feeling of Stress : Not at all  Social Connections: Moderately Integrated (03/29/2024)   Social Connection and Isolation Panel    Frequency of Communication with Friends and Family: More than three times a week    Frequency of Social Gatherings with Friends and Family: More than three times a week     Attends Religious Services: 1 to 4 times per year    Active Member of [employer] or Organizations: No    Attends Banker Meetings: Never    Marital Status: Married  Catering manager Violence: Not At Risk (03/29/2024)   Humiliation, Afraid, Rape, and Kick questionnaire    Fear of Current or Ex-Partner: No    Emotionally Abused: No    Physically Abused: No    Sexually Abused: No    FAMILY HISTORY: Family History  Problem Relation Age of Onset   Heart disease Mother    Asthma Mother    Cancer Mother        uterine    Depression Mother    Heart attack Mother 77   Hyperlipidemia Mother    Hypertension Mother    Stroke Mother    Kidney disease Mother  Heart attack Sister    Stroke Sister    Depression Sister    Diabetes Sister    Hyperlipidemia Sister    Depression Sister    Diabetes Sister    Hyperlipidemia Sister    HIV/AIDS Sister    Diabetes Brother    Asthma Brother    Hypertension Brother    Cancer Brother        colon   HIV/AIDS Brother    Heart disease Maternal Grandmother    Heart attack Maternal Grandmother    Cancer Maternal Aunt        lung   Hypertension Daughter    Colon cancer Neg Hx     ALLERGIES:  is allergic to gazyva  [obinutuzumab ], aspirin , no healthtouch food allergies, aspirin , and baclofen .  MEDICATIONS:  Current Outpatient Medications  Medication Sig Dispense Refill   acetaminophen  (TYLENOL ) 500 MG tablet Take 500-1,000 mg by mouth every 6 (six) hours as needed for moderate pain.     albuterol  (PROVENTIL ) (2.5 MG/3ML) 0.083% nebulizer solution Inhale 1 vial via nebulizer every 6 hours for wheezing or shortness of breath as needed 75 mL 11   albuterol  (VENTOLIN  HFA) 108 (90 Base) MCG/ACT inhaler Inhale 2 puffs by mouth into the lungs every 4 hours as needed for wheezing/shortness of breath 8.5 each 11   Alcohol  Swabs (ALCOHOL  PADS) 70 % PADS      amLODipine  (NORVASC ) 10 MG tablet Take 1 tablet by mouth every day 30 tablet 11    atorvastatin  (LIPITOR ) 80 MG tablet Take 1 tablet by mouth daily 30 tablet 11   Blood Glucose Calibration (OT ULTRA/FASTTK CNTRL SOLN) SOLN See admin instructions.     blood glucose meter kit and supplies KIT Dispense based on patient and insurance preference. Use up to four times daily as directed. 1 each 0   Blood Glucose Monitoring Suppl (ACCU-CHEK GUIDE ME) w/Device KIT Use to test blood sugar twice per day. E11.9 1 kit 1   buPROPion  (WELLBUTRIN  XL) 150 MG 24 hr tablet Take 1 tablet (150 mg total) by mouth daily. For smoking cessation 90 tablet 1   cholecalciferol (VITAMIN D3) 25 MCG (1000 UNIT) tablet Take 1,000 Units by mouth once a week.     clopidogrel  (PLAVIX ) 75 MG tablet Take 1 tablet by mouth every day 30 tablet 11   DULoxetine  (CYMBALTA ) 60 MG capsule Take 1 capsule by mouth every day 30 capsule 11   famotidine  (PEPCID ) 20 MG tablet Take 1 tablet by mouth twice daily (Patient taking differently: Take 20 mg by mouth at bedtime.) 60 tablet 11   FERREX 150 150 MG capsule Take 1 capsule by mouth every day 30 capsule 11   furosemide  (LASIX ) 20 MG tablet Take 1 tablet by mouth every day 30 tablet 11   gabapentin  (NEURONTIN ) 300 MG capsule Take 2 capsules (600 mg total) by mouth at bedtime. 180 capsule 1   glucose blood (ONETOUCH VERIO) test strip Use to check blood sugar three times daily. 100 each 12   JARDIANCE  25 MG TABS tablet Take 1 tablet by mouth every day 30 tablet 11   Lancet Devices (EASY MINI EJECT LANCING DEVICE) MISC daily.     Lancets (ONETOUCH DELICA PLUS LANCET33G) MISC USE   TO CHECK GLUCOSE ONCE DAILY (Patient taking differently: USE   TO CHECK GLUCOSE ONCE DAILY) 100 each 0   lidocaine  (LIDODERM ) 5 % Place 1 patch onto the skin daily. Remove & Discard patch within 12 hours or as directed by MD (  Patient taking differently: Place 1 patch onto the skin daily as needed (for pain).) 30 patch 0   losartan  (COZAAR ) 50 MG tablet Take 1 tablet by mouth at bedtime 30 tablet 11    metFORMIN  (GLUCOPHAGE -XR) 500 MG 24 hr tablet Take 2 tabs by mouth daily with breakfast. Need appt 60 tablet 11   methocarbamol  (ROBAXIN ) 750 MG tablet Take 1 tablet by mouth every 8 hours as needed for muscle spasms 30 tablet 2   montelukast  (SINGULAIR ) 10 MG tablet Take 1 tablet by mouth at bedtime 30 tablet 11   morphine  (MSIR) 15 MG tablet Take 0.5 tablets (7.5 mg total) by mouth every 4 (four) hours as needed. 3 tablet 0   nicotine  (NICODERM CQ  - DOSED IN MG/24 HOURS) 14 mg/24hr patch Place 1 patch (14 mg total) onto the skin daily. 28 patch 1   nitroGLYCERIN  (NITROSTAT ) 0.4 MG SL tablet Dissolve 1 tablet under tongue every 5 minutes, up to 3 doses for chest pain 25 tablet 11   TRELEGY ELLIPTA  100-62.5-25 MCG/ACT AEPB Inhale 1 puff by mouth every day 60 each 11   TRULICITY  0.75 MG/0.5ML SOAJ Inject 0.75 mg subcutaneously once a week 2 mL 11   varenicline  (CHANTIX  CONTINUING MONTH PAK) 1 MG tablet Take 1 tablet (1 mg total) by mouth daily. If tolerating well after a week, increase to twice a day. 60 tablet 1   No current facility-administered medications for this visit.    REVIEW OF SYSTEMS:   .10 Point review of Systems was done is negative except as noted above.  PHYSICAL EXAMINATION: ECOG FS:2 - Symptomatic, <50% confined to bed  Vitals:   08/17/24 1507  BP: 123/69  Pulse: 66  Resp: 18  Temp: (!) 97.3 F (36.3 C)  SpO2: 93%   Wt Readings from Last 3 Encounters:  08/17/24 141 lb 14.4 oz (64.4 kg)  08/15/24 138 lb 3.2 oz (62.7 kg)  08/06/24 135 lb 6.4 oz (61.4 kg)   Body mass index is 25.95 kg/m.   [name] [name]:alert, in no acute distress and comfortable SKIN: no acute rashes, no significant lesions EYES: conjunctiva are pink and non-injected, sclera anicteric OROPHARYNX: MMM, no exudates, no oropharyngeal erythema or ulceration NECK: supple, no JVD LYMPH:  no palpable lymphadenopathy in the cervical, axillary or inguinal regions LUNGS: clear to auscultation b/l with  normal respiratory effort HEART: regular rate & rhythm ABDOMEN:  normoactive bowel sounds , non tender, not distended. Extremity: no pedal edema PSYCH: alert & oriented x 3 with fluent speech NEURO: no focal motor/sensory deficits  LABORATORY DATA:  I have reviewed the data as listed  .    Latest Ref Rng & Units 08/17/2024    2:57 PM 06/05/2024    9:11 AM 04/11/2024    2:13 AM  CBC  WBC 4.0 - 10.5 K/uL 18.3   10.5   Hemoglobin 12.0 - 15.0 g/dL 87.1  85.3  86.6   Hematocrit 36.0 - 46.0 % 40.9  43.0  43.4   Platelets 150 - 400 K/uL 165   176    .    Latest Ref Rng & Units 08/17/2024    2:57 PM 06/05/2024    9:11 AM 04/11/2024    2:13 AM  CMP  Glucose 70 - 99 mg/dL 857  868  841   BUN 8 - 23 mg/dL 10  8  7    Creatinine 0.44 - 1.00 mg/dL 8.70  8.69  8.72   Sodium 135 - 145 mmol/L 143  144  139   Potassium 3.5 - 5.1 mmol/L 4.5  4.1  3.4   Chloride 98 - 111 mmol/L 108  105  108   CO2 22 - 32 mmol/L 31   24   Calcium  8.9 - 10.3 mg/dL 8.6   9.0   Total Protein 6.5 - 8.1 g/dL 5.9   6.0   Total Bilirubin 0.0 - 1.2 mg/dL 0.3   0.6   Alkaline Phos 38 - 126 U/L 152   107   AST 15 - 41 U/L 25   18   ALT 0 - 44 U/L 20   14     Lab Results  Component Value Date   LDH 460 (H) 08/17/2024         RADIOGRAPHIC STUDIES: I have personally reviewed the radiological images as listed and agreed with the findings in the report. No results found.   Surgical Pathology 12/13/2017   ASSESSMENT & PLAN:   [name] [name] is a wonderful 78 y.o. female with    1. Rai 1 Chronic Lymphocytic Leukemia  - Trisomy 12 mutation. Status post treatment with Gazyva  plus venetoclax .  Currently on active surveillance.  #2 .Lung nodules on CT in 12/2017. Rpt CT chest 08/09/2018 - stable  #3 Patient Active Problem List   Diagnosis Date Noted   Myofascial pain 04/25/2024   Cigarette smoker 04/25/2024   Respiratory distress 02/03/2024   Foraminal stenosis of lumbosacral region 01/14/2023   Right  leg swelling 09/03/2022   Meningioma (HCC) 09/03/2022   Polypharmacy 04/07/2022   Iron  deficiency anemia 02/12/2021   Peripheral arterial disease (HCC) 10/03/2020   (HFpEF) heart failure with preserved ejection fraction (HCC) 06/11/2020   Muscle spasm 03/07/2020   Venous insufficiency (chronic) (peripheral) 06/22/2019   Pulmonary nodules 01/04/2018   Mass of left lower leg 01/04/2018   Chronic kidney disease (CKD), stage III (moderate) (HCC) 06/04/2016   Chronic back pain 10/02/2015   DM (diabetes mellitus), type 2 with peripheral vascular complications (HCC) 06/11/2009   Chronic lymphocytic leukemia (HCC) 04/11/2009   HYPERCHOLESTEROLEMIA 02/23/2007   TOBACCO DEPENDENCE 02/23/2007   Essential hypertension 02/23/2007   COPD (chronic obstructive pulmonary disease) (HCC) 02/23/2007   GASTROESOPHAGEAL REFLUX, NO ESOPHAGITIS 02/23/2007    PLAN: - Lab results from today with the patient in details CBC shows WBC count of 18.3k with increasing lymphocytosis, normal hemoglobin of 12.8 and normal platelets of 165k CMP is stable LDH is elevated at 460.  No evidence of hemolysis based on normal hemoglobin levels.  Could be partly from CLL progression but also believe this is partly from lung inflammation. - Patient currently is noted to have recurrent/progression of her CLL but is currently asymptomatic with no significant lymphadenopathy or splenomegaly and normal hemoglobin and platelets.  No constitutional symptoms. - She prefers to take conservative approach and continue to monitor this. We shall see her back with repeat labs in 4 months instead of 6 months to monitor for additional progression that might trigger reinitiation of treatment. - She is eating well and has gained about 6 pounds in the last few months. -Again counseled on smoking cessation  FOLLOW UP: Return of thyroid  care with labs in 4 months  .The total time spent in the appointment was 21 minutes* .  All of the patient's  questions were answered with apparent satisfaction. The patient knows to call the clinic with any problems, questions or concerns.   Emaline Saran MD MS AAHIVMS [facility] Hematology/Oncology Physician [facility]  .*  Total Encounter Time as defined by the Centers for Medicare and Medicaid Services includes, in addition to the face-to-face time of a patient visit (documented in the note above) non-face-to-face time: obtaining and reviewing outside history, ordering and reviewing medications, tests or procedures, care coordination (communications with other health care professionals or caregivers) and documentation in the medical record.

## 2024-08-24 ENCOUNTER — Other Ambulatory Visit: Payer: Self-pay | Admitting: Physical Medicine and Rehabilitation

## 2024-08-24 MED ORDER — BUPROPION HCL ER (XL) 150 MG PO TB24: 150.0000 mg | ORAL_TABLET | Freq: Every day | ORAL | 1 refills | Status: DC

## 2024-08-30 ENCOUNTER — Other Ambulatory Visit: Payer: Self-pay | Admitting: Family Medicine

## 2024-09-10 ENCOUNTER — Encounter: Payer: Self-pay | Admitting: Hematology

## 2024-09-10 NOTE — Progress Notes (Signed)
Orders placed in this encounter.

## 2024-09-14 ENCOUNTER — Other Ambulatory Visit: Payer: Self-pay

## 2024-09-14 DIAGNOSIS — F17210 Nicotine dependence, cigarettes, uncomplicated: Secondary | ICD-10-CM

## 2024-09-14 MED ORDER — METHOCARBAMOL 750 MG PO TABS: 750.0000 mg | ORAL_TABLET | Freq: Three times a day (TID) | ORAL | 1 refills | Status: DC | PRN

## 2024-09-14 MED ORDER — VARENICLINE TARTRATE 1 MG PO TABS: 1.0000 mg | ORAL_TABLET | Freq: Every day | ORAL | 1 refills | Status: DC

## 2024-09-14 NOTE — Telephone Encounter (Signed)
 Chart reviewed. Rx refilled.

## 2024-09-28 ENCOUNTER — Other Ambulatory Visit: Payer: Self-pay | Admitting: Family Medicine

## 2024-09-28 NOTE — Telephone Encounter (Signed)
 Chart reviewed. Rx refilled. Adjusted dosing for kidney function.

## 2024-10-02 ENCOUNTER — Other Ambulatory Visit: Payer: Self-pay | Admitting: Family Medicine

## 2024-10-02 DIAGNOSIS — F17210 Nicotine dependence, cigarettes, uncomplicated: Secondary | ICD-10-CM

## 2024-10-02 NOTE — Telephone Encounter (Signed)
 Chart reviewed. Rx refilled. Pt has upcoming appt in Nov.

## 2024-10-15 ENCOUNTER — Encounter: Payer: Self-pay | Admitting: Diagnostic Neuroimaging

## 2024-10-15 ENCOUNTER — Ambulatory Visit: Admitting: Diagnostic Neuroimaging

## 2024-10-15 VITALS — BP 134/71 | HR 61 | Ht 62.0 in | Wt 141.8 lb

## 2024-10-15 DIAGNOSIS — E1142 Type 2 diabetes mellitus with diabetic polyneuropathy: Secondary | ICD-10-CM

## 2024-10-15 DIAGNOSIS — Z7984 Long term (current) use of oral hypoglycemic drugs: Secondary | ICD-10-CM | POA: Diagnosis not present

## 2024-10-15 DIAGNOSIS — M79604 Pain in right leg: Secondary | ICD-10-CM

## 2024-10-15 DIAGNOSIS — M79605 Pain in left leg: Secondary | ICD-10-CM

## 2024-10-15 NOTE — Patient Instructions (Signed)
  Diabetic neuropathy (right > left leg pain / numbness) - continue diabetes control - continue atorvastatin , BP control - continue smoking cessation - follow up foot hygiene - continue duloxetine  60mg  daily - continue gabapentin  600mg  at bedtime; increase as tolerated - consider capsaicin  cream, lidocaine  patch / cream, alpha-lipoic acid 600mg  daily

## 2024-10-15 NOTE — Progress Notes (Signed)
 [facility]  PATIENT: [name] [name] DOB: [birth year]  REFERRING CLINICIAN: Serene Gaile ORN, MD HISTORY FROM: PATIENT  REASON FOR VISIT: NEW CONSULT   HISTORICAL  CHIEF COMPLAINT:  Chief Complaint  Patient presents with   New Patient (Initial Visit)    Pt in 6 alone Pt states numbness and burning on both legs and feet Pt states numbness in both hands      HISTORY OF PRESENT ILLNESS:   79 year old female with hypertension hyperlipidemia, diabetes, congestive heart failure, here for evaluation of lower extremity pain.  Patient has had pain in the right greater than left leg with numbness, tingling and burning sensation for at least 1 year or longer.  Also has history of peripheral vascular disease status post graft bypass.   REVIEW OF SYSTEMS: Full 14 system review of systems performed and negative with exception of: as per HPI.  ALLERGIES: Allergies  Allergen Reactions   Gazyva  [Obinutuzumab ] Shortness Of Breath    Acute respiratory distress   Aspirin      Abdominal pain.    No [employer] Food Allergies Diarrhea and Nausea And Vomiting    Cabbage, stomach pain    Aspirin  Other (See Comments)    irritates stomach   Baclofen  Other (See Comments)    Stomach irritation    HOME MEDICATIONS: Outpatient Medications Prior to Visit  Medication Sig Dispense Refill   acetaminophen  (TYLENOL ) 500 MG tablet Take 500-1,000 mg by mouth every 6 (six) hours as needed for moderate pain.     albuterol  (PROVENTIL ) (2.5 MG/3ML) 0.083% nebulizer solution Inhale 1 vial via nebulizer every 6 hours for wheezing or shortness of breath as needed 75 mL 11   albuterol  (VENTOLIN  HFA) 108 (90 Base) MCG/ACT inhaler Inhale 2 puffs by mouth into the lungs every 4 hours as needed for wheezing/shortness of breath 8.5 each 11   Alcohol  Swabs (ALCOHOL  PADS) 70 % PADS      amLODipine  (NORVASC ) 10 MG tablet Take 1 tablet by mouth every day 30 tablet 11   atorvastatin  (LIPITOR ) 80 MG  tablet Take 1 tablet by mouth daily 30 tablet 11   Blood Glucose Calibration (OT ULTRA/FASTTK CNTRL SOLN) SOLN See admin instructions.     blood glucose meter kit and supplies KIT Dispense based on patient and insurance preference. Use up to four times daily as directed. 1 each 0   Blood Glucose Monitoring Suppl (ACCU-CHEK GUIDE ME) w/Device KIT Use to test blood sugar twice per day. E11.9 1 kit 1   buPROPion  (WELLBUTRIN  XL) 150 MG 24 hr tablet Take 1 tablet (150 mg total) by mouth daily. For smoking cessation 90 tablet 1   cholecalciferol (VITAMIN D3) 25 MCG (1000 UNIT) tablet Take 1,000 Units by mouth once a week.     clopidogrel  (PLAVIX ) 75 MG tablet Take 1 tablet by mouth every day 30 tablet 11   DULoxetine  (CYMBALTA ) 60 MG capsule Take 1 capsule by mouth every day 30 capsule 11   famotidine  (PEPCID ) 20 MG tablet Take 1 tablet (20 mg total) by mouth at bedtime. 30 tablet 5   FERREX 150 150 MG capsule Take 1 capsule by mouth every day 30 capsule 11   furosemide  (LASIX ) 20 MG tablet Take 1 tablet by mouth every day 30 tablet 11   gabapentin  (NEURONTIN ) 300 MG capsule Take 2 capsules (600 mg total) by mouth at bedtime. 180 capsule 1   glucose blood (ONETOUCH VERIO) test strip Use to check blood sugar three times daily. 100 each  12   JARDIANCE  25 MG TABS tablet Take 1 tablet by mouth every day 30 tablet 11   Lancet Devices (EASY MINI EJECT LANCING DEVICE) MISC daily.     Lancets (ONETOUCH DELICA PLUS LANCET33G) MISC USE   TO CHECK GLUCOSE ONCE DAILY (Patient taking differently: USE   TO CHECK GLUCOSE ONCE DAILY) 100 each 0   lidocaine  (LIDODERM ) 5 % Place 1 patch onto the skin daily. Remove & Discard patch within 12 hours or as directed by MD (Patient taking differently: Place 1 patch onto the skin daily as needed (for pain).) 30 patch 0   losartan  (COZAAR ) 50 MG tablet Take 1 tablet by mouth at bedtime 30 tablet 11   metFORMIN  (GLUCOPHAGE -XR) 500 MG 24 hr tablet Take 2 tabs by mouth daily with  breakfast. Need appt 60 tablet 11   methocarbamol  (ROBAXIN ) 750 MG tablet Take 1 tablet (750 mg total) by mouth every 8 (eight) hours as needed for muscle spasms. 30 tablet 1   montelukast  (SINGULAIR ) 10 MG tablet Take 1 tablet by mouth at bedtime 30 tablet 11   morphine  (MSIR) 15 MG tablet Take 0.5 tablets (7.5 mg total) by mouth every 4 (four) hours as needed. 3 tablet 0   nicotine  (NICODERM CQ  - DOSED IN MG/24 HOURS) 14 mg/24hr patch Place 1 patch (14 mg total) onto the skin daily. 28 patch 1   nitroGLYCERIN  (NITROSTAT ) 0.4 MG SL tablet Dissolve 1 tablet under tongue every 5 minutes, up to 3 doses for chest pain 25 tablet 11   TRELEGY ELLIPTA  100-62.5-25 MCG/ACT AEPB Inhale 1 puff by mouth every day 60 each 11   TRULICITY  0.75 MG/0.5ML SOAJ Inject 0.75 mg subcutaneously once a week 2 mL 11   varenicline  (CHANTIX ) 1 MG tablet Take 1 tablet twice daily. If having bad side effects, can reduce to once per day 60 tablet 3   No facility-administered medications prior to visit.    PAST MEDICAL HISTORY: Past Medical History:  Diagnosis Date   Allergy    environmental   Asthma    Brain tumor (HCC)    Chronic diastolic CHF (congestive heart failure) (HCC)    CKD (chronic kidney disease)    CLL (chronic lymphocytic leukemia) (HCC)    Constipation 11/15/2011   COPD (chronic obstructive pulmonary disease) (HCC)    CVA (cerebral vascular accident) (HCC)    Diabetes (HCC)    GERD (gastroesophageal reflux disease)    Headache    migraines   Hyperlipidemia    Hypertension    Pneumonia    PVD (peripheral vascular disease)    Stroke (HCC) 1998   in 1998 due to tumor-right side    PAST SURGICAL HISTORY: Past Surgical History:  Procedure Laterality Date   ABDOMINAL AORTOGRAM W/LOWER EXTREMITY Bilateral 08/12/2020   Procedure: ABDOMINAL AORTOGRAM W/BILATERAL LOWER EXTREMITY RUNOFF;  Surgeon: Serene Gaile ORN, MD;  Location: [facility];  Service: Cardiovascular;  Laterality: Bilateral;    ABDOMINAL AORTOGRAM W/LOWER EXTREMITY N/A 06/05/2024   Procedure: ABDOMINAL AORTOGRAM W/LOWER EXTREMITY;  Surgeon: Serene Gaile ORN, MD;  Location: [facility];  Service: Cardiovascular;  Laterality: N/A;   ABI  02/2012   ABI <0.65 BL 02/2012   BRAIN MENINGIOMA EXCISION     BRAIN TUMOR EXCISION     Bypass grafting of RLE for PAD claudication      CARDIAC CATHETERIZATION  04/09/208   CESAREAN SECTION     1974, 78 ,80   CHOLECYSTECTOMY, LAPAROSCOPIC  COLONOSCOPY  2014   [place], [place]   ENDARTERECTOMY FEMORAL Right 10/03/2020   Procedure: RIGHT FEMORAL ENDARTERECTOMY WITH VEIN PATCH ANGIOPLASTY;  Surgeon: Serene Gaile ORN, MD;  Location: [facility];  Service: Vascular;  Laterality: Right;   ESOPHAGEAL DILATION     FEMORAL-POPLITEAL BYPASS GRAFT Right 10/03/2020   Procedure: RIGHT FEMORAL- BELOW KNEE POPLITEAL BYPASS USING GORE PROPATEN GRAFT;  Surgeon: Serene Gaile ORN, MD;  Location: [facility];  Service: Vascular;  Laterality: Right;   LOWER EXTREMITY ANGIOGRAPHY N/A 06/05/2024   Procedure: Lower Extremity Angiography;  Surgeon: Serene Gaile ORN, MD;  Location: [facility];  Service: Cardiovascular;  Laterality: N/A;   PATCH ANGIOPLASTY  10/03/2020   Procedure: VEIN PATCH ANGIOPLASTY;  Surgeon: Serene Gaile ORN, MD;  Location: [facility];  Service: Vascular;;    FAMILY HISTORY: Family History  Problem Relation Age of Onset   Heart disease Mother    Asthma Mother    Cancer Mother        uterine    Depression Mother    Heart attack Mother 67   Hyperlipidemia Mother    Hypertension Mother    Stroke Mother    Kidney disease Mother    Heart attack Sister    Stroke Sister    Depression Sister    Diabetes Sister    Hyperlipidemia Sister    Depression Sister    Diabetes Sister    Hyperlipidemia Sister    HIV/AIDS Sister    Diabetes Brother    Asthma Brother    Hypertension Brother    Cancer Brother        colon   HIV/AIDS Brother    Cancer Maternal Aunt        lung    Heart disease Maternal Grandmother    Heart attack Maternal Grandmother    Hypertension Daughter    Colon cancer Neg Hx    Neuropathy Neg Hx     SOCIAL HISTORY: Social History   Socioeconomic History   Marital status: Single    Spouse name: Not on file   Number of children: 3   Years of education: Not on file   Highest education level: Not on file  Occupational History   Occupation: DISABLED    Employer: DISABLED  Tobacco Use   Smoking status: Every Day    Current packs/day: 0.25    Average packs/day: 0.3 packs/day for 48.8 years (12.2 ttl pk-yrs)    Types: Cigarettes    Start date: 01/01/1976    Passive exposure: Current   Smokeless tobacco: Never   Tobacco comments:    ~ 3 cigarettes / day  Vaping Use   Vaping status: Never Used  Substance and Sexual Activity   Alcohol  use: Not Currently   Drug use: Not Currently   Sexual activity: Yes  Other Topics Concern   Not on file  Social History Narrative   ** Merged History Encounter ** Health Care POA:    Emergency Contact: Jamee Brakeman [phone] (c)   End of Life Plan:    Who lives with you: Lives with husband   Any pets: none   Diet: Patient lacks financial resources for much food. Pt reports eating what is available.   Exercise: Patient does not have an exercise plan.   Seatbelts: Patient reports wearing seatbelt when in vehicle.    [place] Exposure/Protection:   Hobbies: Bowling, computer games, Bingo      Has financial difficulties and transportation issues as she and her husband share  transportation     Retired       Teacher, early years/pre Strain: Low Risk  (03/29/2024)   Overall Financial Resource Strain (CARDIA)    Difficulty of Paying Living Expenses: Not very hard  Food Insecurity: No Food Insecurity (03/29/2024)   Hunger Vital Sign    Worried About Running Out of Food in the Last Year: Never true    Ran Out of Food in the Last Year: Never true  Transportation Needs: No Transportation  Needs (03/29/2024)   PRAPARE - Administrator, Civil Service (Medical): No    Lack of Transportation (Non-Medical): No  Physical Activity: Insufficiently Active (03/29/2024)   Exercise Vital Sign    Days of Exercise per Week: 3 days    Minutes of Exercise per Session: 30 min  Stress: No Stress Concern Present (03/29/2024)   [employer] of Occupational Health - Occupational Stress Questionnaire    Feeling of Stress : Not at all  Social Connections: Moderately Integrated (03/29/2024)   Social Connection and Isolation Panel    Frequency of Communication with Friends and Family: More than three times a week    Frequency of Social Gatherings with Friends and Family: More than three times a week    Attends Religious Services: 1 to 4 times per year    Active Member of [employer] or Organizations: No    Attends Banker Meetings: Never    Marital Status: Married  Catering manager Violence: Not At Risk (03/29/2024)   Humiliation, Afraid, Rape, and Kick questionnaire    Fear of Current or Ex-Partner: No    Emotionally Abused: No    Physically Abused: No    Sexually Abused: No     PHYSICAL EXAM  GENERAL EXAM/CONSTITUTIONAL: Vitals:  Vitals:   10/15/24 1113  BP: 134/71  Pulse: 61  Weight: 141 lb 12.8 oz (64.3 kg)  Height: 5' 2 (1.575 m)   Body mass index is 25.94 kg/m. Wt Readings from Last 3 Encounters:  10/15/24 141 lb 12.8 oz (64.3 kg)  08/17/24 141 lb 14.4 oz (64.4 kg)  08/15/24 138 lb 3.2 oz (62.7 kg)   Patient is in no distress; well developed, nourished and groomed; neck is supple  CARDIOVASCULAR: Examination of carotid arteries is normal; no carotid bruits Regular rate and rhythm, no murmurs Examination of peripheral vascular system by observation and palpation is normal  EYES: Ophthalmoscopic exam of optic discs and posterior segments is normal; no papilledema or hemorrhages No results found.  MUSCULOSKELETAL: Gait, strength, tone, movements noted  in Neurologic exam below  NEUROLOGIC: MENTAL STATUS:     03/29/2024    2:22 PM 04/27/2011   10:00 AM  MMSE - Mini Mental State Exam  Not completed: Unable to complete   Orientation to time  5   Orientation to Place  5   Registration  3   Attention/ Calculation  5   Recall  1   Language- name 2 objects  2   Language- repeat  1  Language- follow 3 step command  3   Language- read & follow direction  1   Write a sentence  1   Copy design  1   Total score  28      Data saved with a previous flowsheet row definition   awake, alert, oriented to person, place and time recent and remote memory intact normal attention and concentration language fluent, comprehension intact, naming intact fund of knowledge appropriate  CRANIAL NERVE:  2nd - no papilledema on fundoscopic exam 2nd, 3rd, 4th, 6th - pupils equal and reactive to light, visual fields full to confrontation, extraocular muscles intact, no nystagmus 5th - facial sensation symmetric 7th - facial strength symmetric 8th - hearing intact 9th - palate elevates symmetrically, uvula midline 11th - shoulder shrug symmetric 12th - tongue protrusion midline  MOTOR:  normal bulk and tone, full strength in the BUE, BLE RIGHT LEG SWOLLEN COMPARED TO LEFT  SENSORY:  normal and symmetric to light touch, temperature, vibration  COORDINATION:  finger-nose-finger, fine finger movements normal  REFLEXES:  deep tendon reflexes TRACE and symmetric; ABSENT AT ANKLES  GAIT/STATION:  narrow based gait     DIAGNOSTIC DATA (LABS, IMAGING, TESTING) - I reviewed patient records, labs, notes, testing and imaging myself where available.  Lab Results  Component Value Date   WBC 18.3 (H) 08/17/2024   HGB 12.8 08/17/2024   HCT 40.9 08/17/2024   MCV 89.9 08/17/2024   PLT 165 08/17/2024      Component Value Date/Time   NA 143 08/17/2024 1457   NA 142 04/06/2024 1437   NA 142 12/13/2017 1217   K 4.5 08/17/2024 1457   K 4.5  12/13/2017 1217   CL 108 08/17/2024 1457   CO2 31 08/17/2024 1457   CO2 30 (H) 12/13/2017 1217   GLUCOSE 142 (H) 08/17/2024 1457   GLUCOSE 98 12/13/2017 1217   BUN 10 08/17/2024 1457   BUN 11 04/06/2024 1437   BUN 10.2 12/13/2017 1217   CREATININE 1.29 (H) 08/17/2024 1457   CREATININE 1.3 (H) 12/13/2017 1217   CALCIUM  8.6 (L) 08/17/2024 1457   CALCIUM  8.9 12/13/2017 1217   PROT 5.9 (L) 08/17/2024 1457   PROT 6.2 03/09/2019 1024   PROT 7.0 12/13/2017 1217   ALBUMIN  4.1 08/17/2024 1457   ALBUMIN  4.0 03/09/2019 1024   ALBUMIN  3.6 12/13/2017 1217   AST 25 08/17/2024 1457   AST 11 12/13/2017 1217   ALT 20 08/17/2024 1457   ALT 8 12/13/2017 1217   ALKPHOS 152 (H) 08/17/2024 1457   ALKPHOS 133 12/13/2017 1217   BILITOT 0.3 08/17/2024 1457   BILITOT 0.27 12/13/2017 1217   GFRNONAA 43 (L) 08/17/2024 1457   GFRNONAA 45 (L) 06/04/2016 1634   GFRAA 30 (L) 01/27/2021 1556   GFRAA 36 (L) 08/05/2020 1324   GFRAA 52 (L) 06/04/2016 1634   Lab Results  Component Value Date   CHOL 117 04/06/2024   HDL 36 (L) 04/06/2024   LDLCALC 49 04/06/2024   LDLDIRECT 62 09/24/2010   TRIG 197 (H) 04/06/2024   CHOLHDL 3.3 04/06/2024   Lab Results  Component Value Date   HGBA1C 7.6 (A) 08/15/2024   Lab Results  Component Value Date   VITAMINB12 448 02/06/2021   Lab Results  Component Value Date   TSH 2.47 07/01/2016    04/11/24 BLE VENOUS U/S BILATERAL:  - No evidence of deep vein thrombosis seen in the lower extremities,  bilaterally.  -No evidence of popliteal cyst, bilaterally.    LEFT:  - Incidental finding what appears to be possible occluded bypass graft  with reconstitution of SFA above the knee via collaterals.  - No history of bypass in the chart. Unknown intervention the left leg out  of state years ago.    05/28/24 Bilateral arterial ultrasound Right: Low velocities in the bypass graft could suggest a threatened  bypass.  Left: >50% external iliac stenosis. Total occlusion  noted in the  superficial  femoral artery.   08/31/22 Cervical spine: 1. Erosive facet arthritis on the right at C3-4 with mild anterolisthesis. No specific feature to imply an underlying cause. No fluid collection. 2. C2-3 left facet ankylosis implying prior facet arthritis. 3. Moderate degenerative foraminal narrowing on the left at C4-5 and C5-6.   08/31/22 Thoracic spine: 1. No acute finding. 2. T6-7 central disc herniation contacting the ventral cord.   08/31/22 Lumbar spine: 1. No acute finding. 2. L4-5 and L5-S1 degeneration with moderate foraminal narrowing on the left at L5-S1.   ASSESSMENT AND PLAN  78 y.o. year old female here with:  Dx:  1. Leg pain, bilateral   2. Diabetic polyneuropathy associated with type 2 diabetes mellitus (HCC)     PLAN:  Diabetic neuropathy (right > left leg pain / numbness) - continue diabetes control - continue atorvastatin , BP control - continue smoking cessation - follow up foot hygiene - continue duloxetine  60mg  daily - continue gabapentin  600mg  at bedtime; increase as tolerated - consider capsaicin  cream, lidocaine  patch / cream, alpha-lipoic acid 600mg  daily  Return for return to PCP.    EDUARD FABIENE HANLON, MD 10/15/2024, 1:03 PM Certified in Neurology, Neurophysiology and Neuroimaging  [facility] [address] [place], [place] [address] [phone]

## 2024-11-07 ENCOUNTER — Other Ambulatory Visit: Payer: Self-pay | Admitting: Family Medicine

## 2024-11-07 ENCOUNTER — Encounter: Payer: Self-pay | Admitting: Physical Medicine and Rehabilitation

## 2024-11-07 ENCOUNTER — Encounter: Attending: Physical Medicine and Rehabilitation | Admitting: Physical Medicine and Rehabilitation

## 2024-11-07 VITALS — BP 123/66 | HR 74 | Ht 62.0 in | Wt 140.0 lb

## 2024-11-07 DIAGNOSIS — F17200 Nicotine dependence, unspecified, uncomplicated: Secondary | ICD-10-CM | POA: Diagnosis not present

## 2024-11-07 DIAGNOSIS — M545 Low back pain, unspecified: Secondary | ICD-10-CM | POA: Insufficient documentation

## 2024-11-07 DIAGNOSIS — G8929 Other chronic pain: Secondary | ICD-10-CM | POA: Insufficient documentation

## 2024-11-07 DIAGNOSIS — M79605 Pain in left leg: Secondary | ICD-10-CM

## 2024-11-07 DIAGNOSIS — M7918 Myalgia, other site: Secondary | ICD-10-CM | POA: Diagnosis present

## 2024-11-07 MED ORDER — GABAPENTIN 600 MG PO TABS: 600.0000 mg | ORAL_TABLET | Freq: Every day | ORAL | 1 refills | Status: AC

## 2024-11-07 MED ORDER — BUPROPION HCL ER (XL) 150 MG PO TB24: 150.0000 mg | ORAL_TABLET | Freq: Every day | ORAL | 1 refills | Status: AC

## 2024-11-07 NOTE — Progress Notes (Signed)
 Patient is a 78 yr old female with hx of dCHF, DM 2- A1c 7.6 , COPD?, CLL with prior  WBC >100k- 7.5 at lowest- currently 18.3-as of 08/17/24-  and chronic back and R>>>L foot pain. Has chronic low back and RLE>LLE DM neuropathy. Also has CKD3- Cr 1.39-  Still a smoker- 0.5 ppd.  Here for f/u on Nerve pain   Got back to smoking-  2 ppw- ~ 13/-1/2 ppd.  Has so much going on- with the daughter- 47 years old.  Won't do anything- just living in bed.  Told her to help or out.    Not having muscle spasms in neck Having muscle cramps in legs And then pain from doing everything in home- Husband mopping Cleaned shower and bathroom- and mopped floor.   Still has to sweep and mop at home still   Doesn't feel like needs trp Injections today-   Finished shingles shot and RSV!   Plan: Would set a date for daughter.   2.   Needs trigger point injections- but is scared of needles- so trying to avoid today-   3. Get a tennis ball- hold pressure against a wall for 2-4 minutes on each spot that's tight.  Can also use for buttocks or back of thighs-   4. Sounds like has lumbar support on low back - use it if needed.    5. The Muscle hook- at [employer] or [employer]- should be in stock- Don't know if its at [employer]- use this to hold pressure on muscles to relax them- again not massaging, you are holding pressure- 2-4 minutes in each spot. Especially use on neck.  Can use youtube- theracane- that will have videos to show you how to hold pressure on your muscles to relax them. Please do at least 2-3x/week.    6. Will refill Gabapentin , but change to 600 mg tablets- to take at bedtime sent in 90 days supply with 1 refill.   7.   Con't Wellbutrin - 150 XL daily- for smoking reduction/decrease the urge to smoke.    8. Con't Chantix - gets from Dr Diona - still taking it. Make sure oyu don't run out. It comes monthly   9. F/U in 3 months for f/u on pain. Might need trp Injections at that  visit.  D/w pt and demonstrated to do myofascial release- and d/w pt about smoking- to try and reduce smoking more.

## 2024-11-07 NOTE — Patient Instructions (Signed)
  Plan: Would set a date for daughter.   2.   Needs trigger point injections- but is scared of needles- so trying to avoid today-   3. Get a tennis ball- hold pressure against a wall for 2-4 minutes on each spot that's tight.  Can also use for buttocks or back of thighs-   4. Sounds like has lumbar support on low back - use it if needed.    5. The Muscle hook- at [employer] or [employer]- should be in stock- Don't know if its at [facility]- use this to hold pressure on muscles to relax them- again not massaging, you are holding pressure- 2-4 minutes in each spot. Especially use on neck.  Can use youtube- [employer]racane- that will have videos to show you how to hold pressure on your muscles to relax them. Please do at least 2-3x/week.    6. Will refill Gabapentin , but change to 600 mg tablets- to take at bedtime sent in 90 days supply with 1 refill.   7.   Con't Wellbutrin - 150 XL daily- for smoking reduction/decrease the urge to smoke.    8. Con't Chantix - gets from Dr Diona - still taking it. Make sure oyu don't run out. It comes monthly   9. F/U in 3 months for f/u on pain. Might need trp Injections at that visit.

## 2024-11-08 NOTE — Telephone Encounter (Signed)
 Chart reviewed. Rx refilled.

## 2024-11-12 ENCOUNTER — Other Ambulatory Visit: Payer: Self-pay | Admitting: *Deleted

## 2024-11-13 ENCOUNTER — Ambulatory Visit (INDEPENDENT_AMBULATORY_CARE_PROVIDER_SITE_OTHER): Admitting: Family Medicine

## 2024-11-13 ENCOUNTER — Encounter: Payer: Self-pay | Admitting: Family Medicine

## 2024-11-13 VITALS — BP 136/71 | HR 72 | Ht 62.0 in | Wt 141.6 lb

## 2024-11-13 DIAGNOSIS — E1151 Type 2 diabetes mellitus with diabetic peripheral angiopathy without gangrene: Secondary | ICD-10-CM | POA: Diagnosis not present

## 2024-11-13 DIAGNOSIS — G5603 Carpal tunnel syndrome, bilateral upper limbs: Secondary | ICD-10-CM

## 2024-11-13 DIAGNOSIS — Z720 Tobacco use: Secondary | ICD-10-CM

## 2024-11-13 DIAGNOSIS — M2550 Pain in unspecified joint: Secondary | ICD-10-CM | POA: Diagnosis not present

## 2024-11-13 DIAGNOSIS — Z23 Encounter for immunization: Secondary | ICD-10-CM

## 2024-11-13 DIAGNOSIS — G8929 Other chronic pain: Secondary | ICD-10-CM

## 2024-11-13 MED ORDER — TIZANIDINE HCL 2 MG PO TABS: 2.0000 mg | ORAL_TABLET | Freq: Three times a day (TID) | ORAL | 0 refills | Status: DC | PRN

## 2024-11-13 NOTE — Patient Instructions (Addendum)
 Thank you for visiting clinic today and allowing us  to participate in your care!  Today we discussed continuing on your path to quit smoking. Keep up the great work! We'll continue your medications as is for now.   You may have carpal tunnel syndrome in your wrists. Please try to wear a Carpel Tunnel Wrist Brace like before to help keep your wrist in a neutral position to help your symptoms.   For your muscle spasms, please try taking Tizanidine every 8 hours as needed for symptoms. Stop taking the Robaxin .   Please schedule an appointment in 3 months for follow up.   Reach out any time with any questions or concerns you may have - we are here for you!  Damien Cassis, MD [facility] [phone]

## 2024-11-13 NOTE — Progress Notes (Unsigned)
    SUBJECTIVE:   CHIEF COMPLAINT / HPI:   Smoking cessation  - Patient continues to feel motivated in quitting smoking - A pack lasts about 3 days at this time - She feels hopeful she will continue to reduce use - Goal remains to entirely quit smoking - Remains on Chantix , feels that nicotine  gums are helpful  Chronic pain - Reports that Robaxin  has not been particularly helpful for her chronic pain recently - Reports tramadol  was previously the most helpful - Reports continued intermittent chronic pain of extremities  Wrist/hand pain  - Reports recent pain of bilateral wrists extending into her fingers, with some numbness and tingling - Feels similar to previous carpal tunnel syndrome  PERTINENT  PMH / PSH: Tobacco use, T2DM, COPD, chronic pain, PAD  OBJECTIVE:   BP 136/71   Pulse 72   Ht 5' 2 (1.575 m)   Wt 141 lb 9.6 oz (64.2 kg)   SpO2 94%   BMI 25.90 kg/m   General: No acute distress. Resting comfortably in room. CV: S1/S2. No extra heart sounds.  Pulm: Breathing comfortably on room air. CTAB. No increased WOB. Abd: Soft, non-tender, non-distended. Ext: Positive Phalen's and Tinel's test of bilateral wrists. Psych: Pleasant and appropriate.    ASSESSMENT/PLAN:   Assessment & Plan Tobacco use Remains motivated with goal of complete cessation.  Continue Chantix  and nicotine  gum. Chronic pain of multiple joints Discussed trialing sending in prescription of Robaxin  for less sedating effects.  Rx for tizanidine 2mg  every 8 as needed sent to pharmacy.  Discontinued Robaxin . Bilateral carpal tunnel syndrome History and exam most consistent with carpal tunnel syndrome bilaterally.  Discussed supportive care at home including wrist brace to ensure neutral positioning. DM (diabetes mellitus), type 2 with peripheral vascular complications (HCC) Does not feel like she can leave urine sample today - plan to collect uACR at next visit. Patient reports recently seeing eye  doctor.   Encounter for immunization Annual flu vaccine today.  Patient reports completing shingles vaccine series and getting RSV vaccine at her local pharmacy.   RTC in 3 months for follow-up or sooner as needed.  Damien Cassis, MD [facility]

## 2024-11-14 NOTE — Assessment & Plan Note (Signed)
 Does not feel like she can leave urine sample today - plan to collect uACR at next visit. Patient reports recently seeing eye doctor.

## 2024-12-01 ENCOUNTER — Other Ambulatory Visit: Payer: Self-pay | Admitting: Family Medicine

## 2024-12-01 DIAGNOSIS — M79605 Pain in left leg: Secondary | ICD-10-CM

## 2024-12-06 ENCOUNTER — Other Ambulatory Visit: Payer: Self-pay

## 2024-12-06 DIAGNOSIS — C9110 Chronic lymphocytic leukemia of B-cell type not having achieved remission: Secondary | ICD-10-CM

## 2024-12-07 ENCOUNTER — Other Ambulatory Visit: Payer: Self-pay

## 2024-12-07 ENCOUNTER — Inpatient Hospital Stay: Admitting: Hematology

## 2024-12-07 ENCOUNTER — Inpatient Hospital Stay: Attending: Hematology

## 2024-12-07 ENCOUNTER — Inpatient Hospital Stay: Admitting: Physician Assistant

## 2024-12-07 ENCOUNTER — Inpatient Hospital Stay

## 2024-12-07 VITALS — BP 116/66 | HR 69 | Temp 97.2°F | Resp 18 | Wt 137.2 lb

## 2024-12-07 DIAGNOSIS — F17210 Nicotine dependence, cigarettes, uncomplicated: Secondary | ICD-10-CM | POA: Diagnosis not present

## 2024-12-07 DIAGNOSIS — C9110 Chronic lymphocytic leukemia of B-cell type not having achieved remission: Secondary | ICD-10-CM | POA: Diagnosis not present

## 2024-12-07 DIAGNOSIS — Z79899 Other long term (current) drug therapy: Secondary | ICD-10-CM | POA: Diagnosis not present

## 2024-12-07 LAB — CMP (CANCER CENTER ONLY)
ALT: 16 U/L (ref 0–44)
AST: 37 U/L (ref 15–41)
Albumin: 4.4 g/dL (ref 3.5–5.0)
Alkaline Phosphatase: 204 U/L — ABNORMAL HIGH (ref 38–126)
Anion gap: 14 (ref 5–15)
BUN: 14 mg/dL (ref 8–23)
CO2: 25 mmol/L (ref 22–32)
Calcium: 10.1 mg/dL (ref 8.9–10.3)
Chloride: 104 mmol/L (ref 98–111)
Creatinine: 1.76 mg/dL — ABNORMAL HIGH (ref 0.44–1.00)
GFR, Estimated: 29 mL/min — ABNORMAL LOW (ref >60)
Glucose, Bld: 164 mg/dL — ABNORMAL HIGH (ref 70–99)
Potassium: 3.9 mmol/L (ref 3.5–5.1)
Sodium: 143 mmol/L (ref 135–145)
Total Bilirubin: 0.4 mg/dL (ref 0.0–1.2)
Total Protein: 6.6 g/dL (ref 6.5–8.1)

## 2024-12-07 LAB — CBC WITH DIFFERENTIAL (CANCER CENTER ONLY)
Abs Immature Granulocytes: 0.41 K/uL — ABNORMAL HIGH (ref 0.00–0.07)
Basophils Absolute: 0.1 K/uL (ref 0.0–0.1)
Basophils Relative: 0 %
Eosinophils Absolute: 0.1 K/uL (ref 0.0–0.5)
Eosinophils Relative: 0 %
HCT: 41.3 % (ref 36.0–46.0)
Hemoglobin: 13.1 g/dL (ref 12.0–15.0)
Immature Granulocytes: 1 %
Lymphocytes Relative: 53 %
Lymphs Abs: 32.8 K/uL — ABNORMAL HIGH (ref 0.7–4.0)
MCH: 28.5 pg (ref 26.0–34.0)
MCHC: 31.7 g/dL (ref 30.0–36.0)
MCV: 90 fL (ref 80.0–100.0)
Monocytes Absolute: 20.3 K/uL — ABNORMAL HIGH (ref 0.1–1.0)
Monocytes Relative: 33 %
Neutro Abs: 7.7 K/uL (ref 1.7–7.7)
Neutrophils Relative %: 13 %
Platelet Count: 130 K/uL — ABNORMAL LOW (ref 150–400)
RBC: 4.59 MIL/uL (ref 3.87–5.11)
RDW: 18.1 % — ABNORMAL HIGH (ref 11.5–15.5)
Smear Review: NORMAL
WBC Count: 61.4 K/uL (ref 4.0–10.5)
nRBC: 0.2 % (ref 0.0–0.2)

## 2024-12-07 LAB — LACTATE DEHYDROGENASE: LDH: 1345 U/L — ABNORMAL HIGH (ref 105–235)

## 2024-12-07 LAB — URIC ACID: Uric Acid, Serum: 6 mg/dL (ref 2.5–7.1)

## 2024-12-07 NOTE — Progress Notes (Signed)
 HEMATOLOGY/ONCOLOGY CLINIC NOTE  Date of Service: 12/07/2024  Patient Care Team: Diona Perkins, MD as PCP - General Shlomo Wilbert SAUNDERS, MD as PCP - Cardiology (Cardiology) Onesimo Emaline Brink, MD as Consulting Physician (Hematology) Lad[place], [place], [place] as Referring Physician (Optometry) Abran Norleen SAILOR, MD as Consulting Physician (Gastroenterology)  CHIEF COMPLAINTS/PURPOSE OF CONSULTATION:  Follow-up for continued evaluation and management of CLL  INTERVAL HISTORY:  [name] [name] is a wonderful 78 y.o. female who is here for continued evaluation and management of CLL. She was last seen by Dr. Onesimo on 08/17/2024 and returns for a short interval follow up due to signs of progression.   [name] [name] reports her energy and appetite are unchanged from the last visit. She denies nausea, vomiting or bowel habit changes. She denies easy bruising or signs of bleeding. She denies any palpable lymph nodes. She continues to smoke 5-10 cigarettes per day. She denies fevers, chills, sweats, shortness of breath, chest pain or cough. She has no other complaints. Rest of the ROS is below.   MEDICAL HISTORY:  Past Medical History:  Diagnosis Date   Allergy    environmental   Asthma    Brain tumor (HCC)    Chronic diastolic CHF (congestive heart failure) (HCC)    CKD (chronic kidney disease)    CLL (chronic lymphocytic leukemia) (HCC)    Constipation 11/15/2011   COPD (chronic obstructive pulmonary disease) (HCC)    CVA (cerebral vascular accident) (HCC)    Diabetes (HCC)    GERD (gastroesophageal reflux disease)    Headache    migraines   Hyperlipidemia    Hypertension    Pneumonia    PVD (peripheral vascular disease)    Stroke (HCC) 1998   in 1998 due to tumor-right side    SURGICAL HISTORY: Past Surgical History:  Procedure Laterality Date   ABDOMINAL AORTOGRAM W/LOWER EXTREMITY Bilateral 08/12/2020   Procedure: ABDOMINAL AORTOGRAM W/BILATERAL LOWER EXTREMITY RUNOFF;  Surgeon: Serene Gaile ORN, MD;  Location: [facility];  Service: Cardiovascular;  Laterality: Bilateral;   ABDOMINAL AORTOGRAM W/LOWER EXTREMITY N/A 06/05/2024   Procedure: ABDOMINAL AORTOGRAM W/LOWER EXTREMITY;  Surgeon: Serene Gaile ORN, MD;  Location: [facility];  Service: Cardiovascular;  Laterality: N/A;   ABI  02/2012   ABI <0.65 BL 02/2012   BRAIN MENINGIOMA EXCISION     BRAIN TUMOR EXCISION     Bypass grafting of RLE for PAD claudication      CARDIAC CATHETERIZATION  04/09/208   CESAREAN SECTION     1974, 78 ,80   CHOLECYSTECTOMY, LAPAROSCOPIC     COLONOSCOPY  2014   [place], [place]   ENDARTERECTOMY FEMORAL Right 10/03/2020   Procedure: RIGHT FEMORAL ENDARTERECTOMY WITH VEIN PATCH ANGIOPLASTY;  Surgeon: Serene Gaile ORN, MD;  Location: [facility];  Service: Vascular;  Laterality: Right;   ESOPHAGEAL DILATION     FEMORAL-POPLITEAL BYPASS GRAFT Right 10/03/2020   Procedure: RIGHT FEMORAL- BELOW KNEE POPLITEAL BYPASS USING GORE PROPATEN GRAFT;  Surgeon: Serene Gaile ORN, MD;  Location: [facility];  Service: Vascular;  Laterality: Right;   LOWER EXTREMITY ANGIOGRAPHY N/A 06/05/2024   Procedure: Lower Extremity Angiography;  Surgeon: Serene Gaile ORN, MD;  Location: [facility];  Service: Cardiovascular;  Laterality: N/A;   PATCH ANGIOPLASTY  10/03/2020   Procedure: VEIN PATCH ANGIOPLASTY;  Surgeon: Serene Gaile ORN, MD;  Location: [facility];  Service: Vascular;;    SOCIAL HISTORY: Social History   Socioeconomic History  Marital status: Single    Spouse name: Not on file   Number of children: 3   Years of education: Not on file   Highest education level: Not on file  Occupational History   Occupation: DISABLED    Employer: DISABLED  Tobacco Use   Smoking status: Every Day    Current packs/day: 0.25    Average packs/day: 0.3 packs/day for 48.9 years (12.2 ttl pk-yrs)    Types: Cigarettes    Start date: 01/01/1976    Passive exposure: Current   Smokeless tobacco: Never   Tobacco  comments:    ~ 3 cigarettes / day  Vaping Use   Vaping status: Never Used  Substance and Sexual Activity   Alcohol  use: Not Currently   Drug use: Not Currently   Sexual activity: Yes  Other Topics Concern   Not on file  Social History Narrative   ** Merged History Encounter ** Health Care POA:    Emergency Contact: Jamee Brakeman [phone] (c)   End of Life Plan:    Who lives with you: Lives with husband   Any pets: none   Diet: Patient lacks financial resources for much food. Pt reports eating what is available.   Exercise: Patient does not have an exercise plan.   Seatbelts: Patient reports wearing seatbelt when in vehicle.    [place] Exposure/Protection:   Hobbies: Bowling, computer games, Bingo      Has financial difficulties and transportation issues as she and her husband share transportation     Retired       Social Drivers of Health   Tobacco Use: High Risk (11/13/2024)   Patient History    Smoking Tobacco Use: Every Day    Smokeless Tobacco Use: Never    Passive Exposure: Current  Financial Resource Strain: Low Risk (03/29/2024)   Overall Financial Resource Strain (CARDIA)    Difficulty of Paying Living Expenses: Not very hard  Food Insecurity: No Food Insecurity (03/29/2024)   Hunger Vital Sign    Worried About Running Out of Food in the Last Year: Never true    Ran Out of Food in the Last Year: Never true  Transportation Needs: No Transportation Needs (03/29/2024)   PRAPARE - Administrator, Civil Service (Medical): No    Lack of Transportation (Non-Medical): No  Physical Activity: Insufficiently Active (03/29/2024)   Exercise Vital Sign    Days of Exercise per Week: 3 days    Minutes of Exercise per Session: 30 min  Stress: No Stress Concern Present (03/29/2024)   [employer] of Occupational Health - Occupational Stress Questionnaire    Feeling of Stress : Not at all  Social Connections: Moderately Integrated (03/29/2024)   Social Connection and  Isolation Panel    Frequency of Communication with Friends and Family: More than three times a week    Frequency of Social Gatherings with Friends and Family: More than three times a week    Attends Religious Services: 1 to 4 times per year    Active Member of [employer] or Organizations: No    Attends Banker Meetings: Never    Marital Status: Married  Catering Manager Violence: Not At Risk (03/29/2024)   Humiliation, Afraid, Rape, and Kick questionnaire    Fear of Current or Ex-Partner: No    Emotionally Abused: No    Physically Abused: No    Sexually Abused: No  Depression (PHQ2-9): Low Risk (11/07/2024)   Depression (PHQ2-9)    PHQ-2 Score: 0  Alcohol  Screen: Low Risk (03/29/2024)   Alcohol  Screen    Last Alcohol  Screening Score (AUDIT): 0  Housing: Low Risk (03/29/2024)   Housing Stability Vital Sign    Unable to Pay for Housing in the Last Year: No    Number of Times Moved in the Last Year: 0    Homeless in the Last Year: No  Utilities: Not At Risk (03/29/2024)   AHC Utilities    Threatened with loss of utilities: No  Health Literacy: Adequate Health Literacy (03/29/2024)   B1300 Health Literacy    Frequency of need for help with medical instructions: Rarely    FAMILY HISTORY: Family History  Problem Relation Age of Onset   Heart disease Mother    Asthma Mother    Cancer Mother        uterine    Depression Mother    Heart attack Mother 62   Hyperlipidemia Mother    Hypertension Mother    Stroke Mother    Kidney disease Mother    Heart attack Sister    Stroke Sister    Depression Sister    Diabetes Sister    Hyperlipidemia Sister    Depression Sister    Diabetes Sister    Hyperlipidemia Sister    HIV/AIDS Sister    Diabetes Brother    Asthma Brother    Hypertension Brother    Cancer Brother        colon   HIV/AIDS Brother    Cancer Maternal Aunt        lung   Heart disease Maternal Grandmother    Heart attack Maternal Grandmother    Hypertension  Daughter    Colon cancer Neg Hx    Neuropathy Neg Hx     ALLERGIES:  is allergic to gazyva  [obinutuzumab ], aspirin , no healthtouch food allergies, aspirin , and baclofen .  MEDICATIONS:  Current Outpatient Medications  Medication Sig Dispense Refill   acetaminophen  (TYLENOL ) 500 MG tablet Take 500-1,000 mg by mouth every 6 (six) hours as needed for moderate pain.     albuterol  (PROVENTIL ) (2.5 MG/3ML) 0.083% nebulizer solution Inhale 1 vial via nebulizer every 6 hours for wheezing or shortness of breath as needed 75 mL 11   albuterol  (VENTOLIN  HFA) 108 (90 Base) MCG/ACT inhaler Inhale 2 puffs by mouth into the lungs every 4 hours as needed for wheezing/shortness of breath 8.5 each 11   Alcohol  Swabs (ALCOHOL  PADS) 70 % PADS      amLODipine  (NORVASC ) 10 MG tablet Take 1 tablet by mouth every day 30 tablet 11   atorvastatin  (LIPITOR ) 80 MG tablet Take 1 tablet by mouth daily 30 tablet 11   Blood Glucose Calibration (OT ULTRA/FASTTK CNTRL SOLN) SOLN See admin instructions.     blood glucose meter kit and supplies KIT Dispense based on patient and insurance preference. Use up to four times daily as directed. 1 each 0   Blood Glucose Monitoring Suppl (ACCU-CHEK GUIDE ME) w/Device KIT Use to test blood sugar twice per day. E11.9 1 kit 1   buPROPion  (WELLBUTRIN  XL) 150 MG 24 hr tablet Take 1 tablet (150 mg total) by mouth daily. For smoking cessation 90 tablet 1   cholecalciferol (VITAMIN D3) 25 MCG (1000 UNIT) tablet Take 1,000 Units by mouth once a week.     clopidogrel  (PLAVIX ) 75 MG tablet Take 1 tablet by mouth every day 30 tablet 11   DULoxetine  (CYMBALTA ) 60 MG capsule Take 1 capsule by mouth every day 30 capsule 11  famotidine  (PEPCID ) 20 MG tablet Take 1 tablet (20 mg total) by mouth at bedtime. 30 tablet 5   FERREX 150 150 MG capsule Take 1 capsule by mouth every day 30 capsule 11   furosemide  (LASIX ) 20 MG tablet Take 1 tablet by mouth every day 30 tablet 11   gabapentin  (NEURONTIN ) 600  MG tablet Take 1 tablet (600 mg total) by mouth at bedtime. 90 tablet 1   glucose blood (ONETOUCH VERIO) test strip Use to check blood sugar three times daily. 100 each 12   JARDIANCE  25 MG TABS tablet Take 1 tablet by mouth every day 30 tablet 11   Lancet Devices (EASY MINI EJECT LANCING DEVICE) MISC daily.     Lancets (ONETOUCH DELICA PLUS LANCET33G) MISC USE   TO CHECK GLUCOSE ONCE DAILY (Patient taking differently: USE   TO CHECK GLUCOSE ONCE DAILY) 100 each 0   lidocaine  (LIDODERM ) 5 % Apply 1 patch to affected area every day as needed for pain. Remove after 12 hr 30 patch 11   losartan  (COZAAR ) 50 MG tablet Take 1 tablet by mouth at bedtime 30 tablet 11   metFORMIN  (GLUCOPHAGE -XR) 500 MG 24 hr tablet Take 2 tabs by mouth daily with breakfast. Need appt 60 tablet 11   montelukast  (SINGULAIR ) 10 MG tablet Take 1 tablet by mouth at bedtime 30 tablet 11   nitroGLYCERIN  (NITROSTAT ) 0.4 MG SL tablet Dissolve 1 tablet under tongue every 5 minutes, up to 3 doses for chest pain 25 tablet 11   tiZANidine  (ZANAFLEX ) 2 MG tablet Take 1 tablet by mouth every 8 hours as needed for muscle spasms 30 tablet 1   TRELEGY ELLIPTA  100-62.5-25 MCG/ACT AEPB Inhale 1 puff by mouth every day 60 each 11   TRULICITY  0.75 MG/0.5ML SOAJ Inject 0.75 mg subcutaneously once a week 2 mL 11   varenicline  (CHANTIX ) 1 MG tablet Take 1 tablet twice daily. If having bad side effects, can reduce to once per day 60 tablet 3   No current facility-administered medications for this visit.    REVIEW OF SYSTEMS:   .10 Point review of Systems was done is negative except as noted above.  PHYSICAL EXAMINATION: ECOG FS:1 - Symptomatic but completely ambulatory  Vitals:   12/07/24 1314  BP: 116/66  Pulse: 69  Resp: 18  Temp: (!) 97.2 F (36.2 C)  SpO2: 97%   Wt Readings from Last 3 Encounters:  12/07/24 137 lb 3.2 oz (62.2 kg)  11/13/24 141 lb 9.6 oz (64.2 kg)  11/07/24 140 lb (63.5 kg)   Body mass index is 25.09 kg/m.    SABRA GENERAL:alert, in no acute distress and comfortable SKIN: no acute rashes, no significant lesions EYES: conjunctiva are pink and non-injected, sclera anicteric OROPHARYNX: MMM, no exudates, no oropharyngeal erythema or ulceration NECK: supple, no JVD LYMPH:  no palpable lymphadenopathy in the cervical, axillary or inguinal regions LUNGS: clear to auscultation b/l with normal respiratory effort HEART: regular rate & rhythm ABDOMEN:  normoactive bowel sounds , non tender, not distended. Extremity: no pedal edema PSYCH: alert & oriented x 3 with fluent speech NEURO: no focal motor/sensory deficits  LABORATORY DATA:  I have reviewed the data as listed  .    Latest Ref Rng & Units 12/07/2024   12:29 PM 08/17/2024    2:57 PM 06/05/2024    9:11 AM  CBC  WBC 4.0 - 10.5 K/uL 61.4  18.3    Hemoglobin 12.0 - 15.0 g/dL 86.8  87.1  14.6  Hematocrit 36.0 - 46.0 % 41.3  40.9  43.0   Platelets 150 - 400 K/uL 130  165     .    Latest Ref Rng & Units 12/07/2024   12:29 PM 08/17/2024    2:57 PM 06/05/2024    9:11 AM  CMP  Glucose 70 - 99 mg/dL 835  857  868   BUN 8 - 23 mg/dL 14  10  8    Creatinine 0.44 - 1.00 mg/dL 8.23  8.70  8.69   Sodium 135 - 145 mmol/L 143  143  144   Potassium 3.5 - 5.1 mmol/L 3.9  4.5  4.1   Chloride 98 - 111 mmol/L 104  108  105   CO2 22 - 32 mmol/L 25  31    Calcium  8.9 - 10.3 mg/dL 89.8  8.6    Total Protein 6.5 - 8.1 g/dL 6.6  5.9    Total Bilirubin 0.0 - 1.2 mg/dL 0.4  0.3    Alkaline Phos 38 - 126 U/L 204  152    AST 15 - 41 U/L 37  25    ALT 0 - 44 U/L 16  20      Lab Results  Component Value Date   LDH 1,345 (H) 12/07/2024         RADIOGRAPHIC STUDIES: I have personally reviewed the radiological images as listed and agreed with the findings in the report. No results found.   Surgical Pathology 12/13/2017   ASSESSMENT:   NELVA HAUK is a wonderful 78 y.o. female with    1. Rai 1 Chronic Lymphocytic Leukemia  - Trisomy 12  mutation. --Previously treated with Gazyva  plus venetoclax .  Currently on active surveillance. PLAN: --Labs today show WBC 61.4, Hgb 13.1, Plt 130, creatinine increased slightly to 1.76, LFTs normal, LDH 1345.  --No B-symptoms including fevers, night sweats or weight loss.  --Discussed with Dr. Onesimo who plans to discuss treatment initiation with the patient after the holidays --Tentative plan to see Dr. Onesimo with repeat labs the first week of January 2026.   FOLLOW UP: RTC in 3-4 weeks with labs and follow up with Dr. Onesimo   All of the patient's questions were answered with apparent satisfaction. The patient knows to call the clinic with any problems, questions or concerns.   I have spent a total of 25 minutes minutes of face-to-face and non-face-to-face time, preparing to see the patient,  performing a medically appropriate examination, counseling and educating the patient, documenting clinical information in the electronic health record, independently interpreting results and communicating results to the patient, and care coordination.   Johnston Police PA-C Dept of Hematology and Oncology [facility] Phone: [phone]

## 2024-12-09 ENCOUNTER — Other Ambulatory Visit: Payer: Self-pay | Admitting: Physician Assistant

## 2024-12-09 DIAGNOSIS — C9110 Chronic lymphocytic leukemia of B-cell type not having achieved remission: Secondary | ICD-10-CM

## 2024-12-20 ENCOUNTER — Other Ambulatory Visit: Payer: Self-pay | Admitting: Family Medicine

## 2024-12-20 DIAGNOSIS — F17210 Nicotine dependence, cigarettes, uncomplicated: Secondary | ICD-10-CM

## 2024-12-24 NOTE — Telephone Encounter (Signed)
 Chart reviewed. Rx refilled, plan for patient fu in February.

## 2024-12-28 ENCOUNTER — Inpatient Hospital Stay (HOSPITAL_COMMUNITY)

## 2024-12-28 ENCOUNTER — Emergency Department (HOSPITAL_COMMUNITY)

## 2024-12-28 ENCOUNTER — Inpatient Hospital Stay (HOSPITAL_COMMUNITY)
Admission: EM | Admit: 2024-12-28 | Discharge: 2025-01-06 | DRG: 291 | Disposition: A | Discharge status: Home Health | Attending: Internal Medicine | Admitting: Internal Medicine

## 2024-12-28 ENCOUNTER — Other Ambulatory Visit: Payer: Self-pay

## 2024-12-28 ENCOUNTER — Encounter (HOSPITAL_COMMUNITY): Admission: RE | Admit: 2024-12-28 | Source: Ambulatory Visit

## 2024-12-28 DIAGNOSIS — N179 Acute kidney failure, unspecified: Secondary | ICD-10-CM | POA: Diagnosis present

## 2024-12-28 DIAGNOSIS — C9110 Chronic lymphocytic leukemia of B-cell type not having achieved remission: Secondary | ICD-10-CM | POA: Diagnosis present

## 2024-12-28 DIAGNOSIS — R062 Wheezing: Principal | ICD-10-CM

## 2024-12-28 DIAGNOSIS — J441 Chronic obstructive pulmonary disease with (acute) exacerbation: Secondary | ICD-10-CM | POA: Diagnosis present

## 2024-12-28 DIAGNOSIS — K219 Gastro-esophageal reflux disease without esophagitis: Secondary | ICD-10-CM | POA: Diagnosis present

## 2024-12-28 DIAGNOSIS — E1151 Type 2 diabetes mellitus with diabetic peripheral angiopathy without gangrene: Secondary | ICD-10-CM | POA: Diagnosis present

## 2024-12-28 DIAGNOSIS — E785 Hyperlipidemia, unspecified: Secondary | ICD-10-CM | POA: Diagnosis present

## 2024-12-28 DIAGNOSIS — Z83438 Family history of other disorder of lipoprotein metabolism and other lipidemia: Secondary | ICD-10-CM

## 2024-12-28 DIAGNOSIS — D489 Neoplasm of uncertain behavior, unspecified: Secondary | ICD-10-CM | POA: Diagnosis present

## 2024-12-28 DIAGNOSIS — N1832 Chronic kidney disease, stage 3b: Secondary | ICD-10-CM | POA: Diagnosis present

## 2024-12-28 DIAGNOSIS — B9789 Other viral agents as the cause of diseases classified elsewhere: Secondary | ICD-10-CM | POA: Diagnosis present

## 2024-12-28 DIAGNOSIS — Z833 Family history of diabetes mellitus: Secondary | ICD-10-CM

## 2024-12-28 DIAGNOSIS — Z7902 Long term (current) use of antithrombotics/antiplatelets: Secondary | ICD-10-CM | POA: Diagnosis not present

## 2024-12-28 DIAGNOSIS — Z818 Family history of other mental and behavioral disorders: Secondary | ICD-10-CM

## 2024-12-28 DIAGNOSIS — Z825 Family history of asthma and other chronic lower respiratory diseases: Secondary | ICD-10-CM

## 2024-12-28 DIAGNOSIS — Z830 Family history of human immunodeficiency virus [HIV] disease: Secondary | ICD-10-CM

## 2024-12-28 DIAGNOSIS — T380X5A Adverse effect of glucocorticoids and synthetic analogues, initial encounter: Secondary | ICD-10-CM | POA: Diagnosis not present

## 2024-12-28 DIAGNOSIS — Z886 Allergy status to analgesic agent status: Secondary | ICD-10-CM

## 2024-12-28 DIAGNOSIS — D696 Thrombocytopenia, unspecified: Secondary | ICD-10-CM | POA: Diagnosis present

## 2024-12-28 DIAGNOSIS — E875 Hyperkalemia: Secondary | ICD-10-CM | POA: Diagnosis not present

## 2024-12-28 DIAGNOSIS — E1140 Type 2 diabetes mellitus with diabetic neuropathy, unspecified: Secondary | ICD-10-CM | POA: Diagnosis present

## 2024-12-28 DIAGNOSIS — Z7951 Long term (current) use of inhaled steroids: Secondary | ICD-10-CM

## 2024-12-28 DIAGNOSIS — I5033 Acute on chronic diastolic (congestive) heart failure: Secondary | ICD-10-CM | POA: Diagnosis present

## 2024-12-28 DIAGNOSIS — F32A Depression, unspecified: Secondary | ICD-10-CM | POA: Diagnosis present

## 2024-12-28 DIAGNOSIS — I130 Hypertensive heart and chronic kidney disease with heart failure and stage 1 through stage 4 chronic kidney disease, or unspecified chronic kidney disease: Principal | ICD-10-CM | POA: Diagnosis present

## 2024-12-28 DIAGNOSIS — Z8673 Personal history of transient ischemic attack (TIA), and cerebral infarction without residual deficits: Secondary | ICD-10-CM

## 2024-12-28 DIAGNOSIS — Z888 Allergy status to other drugs, medicaments and biological substances status: Secondary | ICD-10-CM

## 2024-12-28 DIAGNOSIS — Z20822 Contact with and (suspected) exposure to covid-19: Secondary | ICD-10-CM | POA: Diagnosis present

## 2024-12-28 DIAGNOSIS — Z91018 Allergy to other foods: Secondary | ICD-10-CM

## 2024-12-28 DIAGNOSIS — Z1152 Encounter for screening for COVID-19: Secondary | ICD-10-CM

## 2024-12-28 DIAGNOSIS — E1122 Type 2 diabetes mellitus with diabetic chronic kidney disease: Secondary | ICD-10-CM | POA: Diagnosis present

## 2024-12-28 DIAGNOSIS — Z79899 Other long term (current) drug therapy: Secondary | ICD-10-CM

## 2024-12-28 DIAGNOSIS — Z8701 Personal history of pneumonia (recurrent): Secondary | ICD-10-CM

## 2024-12-28 DIAGNOSIS — Z8419 Family history of other disorders of kidney and ureter: Secondary | ICD-10-CM

## 2024-12-28 DIAGNOSIS — E1165 Type 2 diabetes mellitus with hyperglycemia: Secondary | ICD-10-CM | POA: Diagnosis present

## 2024-12-28 DIAGNOSIS — Z7985 Long-term (current) use of injectable non-insulin antidiabetic drugs: Secondary | ICD-10-CM

## 2024-12-28 DIAGNOSIS — Z8249 Family history of ischemic heart disease and other diseases of the circulatory system: Secondary | ICD-10-CM

## 2024-12-28 DIAGNOSIS — Z7984 Long term (current) use of oral hypoglycemic drugs: Secondary | ICD-10-CM

## 2024-12-28 DIAGNOSIS — J9601 Acute respiratory failure with hypoxia: Secondary | ICD-10-CM | POA: Diagnosis present

## 2024-12-28 DIAGNOSIS — Z823 Family history of stroke: Secondary | ICD-10-CM

## 2024-12-28 DIAGNOSIS — F17210 Nicotine dependence, cigarettes, uncomplicated: Secondary | ICD-10-CM | POA: Diagnosis present

## 2024-12-28 LAB — RESPIRATORY PANEL BY PCR

## 2024-12-28 LAB — COMPREHENSIVE METABOLIC PANEL WITH GFR
ALT: 33 U/L (ref 0–44)
AST: 39 U/L (ref 15–41)
Albumin: 4 g/dL (ref 3.5–5.0)
Alkaline Phosphatase: 243 U/L — ABNORMAL HIGH (ref 38–126)
Anion gap: 11 (ref 5–15)
BUN: 13 mg/dL (ref 8–23)
CO2: 28 mmol/L (ref 22–32)
Calcium: 9.5 mg/dL (ref 8.9–10.3)
Chloride: 105 mmol/L (ref 98–111)
Creatinine, Ser: 1.6 mg/dL — ABNORMAL HIGH (ref 0.44–1.00)
GFR, Estimated: 33 mL/min — ABNORMAL LOW
Glucose, Bld: 150 mg/dL — ABNORMAL HIGH (ref 70–99)
Potassium: 4.6 mmol/L (ref 3.5–5.1)
Sodium: 143 mmol/L (ref 135–145)
Total Bilirubin: 0.4 mg/dL (ref 0.0–1.2)
Total Protein: 6.4 g/dL — ABNORMAL LOW (ref 6.5–8.1)

## 2024-12-28 LAB — CBC WITH DIFFERENTIAL/PLATELET
Basophils Absolute: 0 K/uL (ref 0.0–0.1)
Basophils Relative: 0 %
Eosinophils Absolute: 1.2 K/uL — ABNORMAL HIGH (ref 0.0–0.5)
Eosinophils Relative: 2 %
HCT: 41.1 % (ref 36.0–46.0)
Hemoglobin: 12.2 g/dL (ref 12.0–15.0)
Lymphocytes Relative: 51 %
Lymphs Abs: 31.6 K/uL — ABNORMAL HIGH (ref 0.7–4.0)
MCH: 28 pg (ref 26.0–34.0)
MCHC: 29.7 g/dL — ABNORMAL LOW (ref 30.0–36.0)
MCV: 94.3 fL (ref 80.0–100.0)
Monocytes Absolute: 0 K/uL — ABNORMAL LOW (ref 0.1–1.0)
Monocytes Relative: 0 %
Neutro Abs: 16.7 K/uL — ABNORMAL HIGH (ref 1.7–7.7)
Neutrophils Relative %: 27 %
Other: 20 %
Platelets: 127 K/uL — ABNORMAL LOW (ref 150–400)
RBC: 4.36 MIL/uL (ref 3.87–5.11)
RDW: 18.7 % — ABNORMAL HIGH (ref 11.5–15.5)
Smear Review: NORMAL
WBC: 61.9 K/uL (ref 4.0–10.5)
nRBC: 0.3 % — ABNORMAL HIGH (ref 0.0–0.2)

## 2024-12-28 LAB — PATHOLOGIST SMEAR REVIEW

## 2024-12-28 LAB — HEMOGLOBIN A1C
Hgb A1c MFr Bld: 8 % — ABNORMAL HIGH (ref 4.8–5.6)
Mean Plasma Glucose: 182.9 mg/dL

## 2024-12-28 LAB — RESP PANEL BY RT-PCR (RSV, FLU A&B, COVID)  RVPGX2
Influenza A by PCR: NEGATIVE
Influenza B by PCR: NEGATIVE
Resp Syncytial Virus by PCR: NEGATIVE
SARS Coronavirus 2 by RT PCR: NEGATIVE

## 2024-12-28 LAB — CBG MONITORING, ED
Glucose-Capillary: 187 mg/dL — ABNORMAL HIGH (ref 70–99)
Glucose-Capillary: 163 mg/dL — ABNORMAL HIGH (ref 70–99)

## 2024-12-28 LAB — LACTATE DEHYDROGENASE: LDH: 1478 U/L — ABNORMAL HIGH (ref 105–235)

## 2024-12-28 LAB — MRSA NEXT GEN BY PCR, NASAL: MRSA by PCR Next Gen: NOT DETECTED

## 2024-12-28 LAB — PRO BRAIN NATRIURETIC PEPTIDE: Pro Brain Natriuretic Peptide: 77.6 pg/mL

## 2024-12-28 LAB — GLUCOSE, CAPILLARY
Glucose-Capillary: 170 mg/dL — ABNORMAL HIGH (ref 70–99)
Glucose-Capillary: 202 mg/dL — ABNORMAL HIGH (ref 70–99)

## 2024-12-28 MED ORDER — CHLORHEXIDINE GLUCONATE CLOTH 2 % EX PADS
6.0000 | MEDICATED_PAD | Freq: Every day | CUTANEOUS | Status: DC
  Administered 2024-12-29 – 2025-01-06 (×6): 6 via TOPICAL

## 2024-12-28 MED ORDER — METHYLPREDNISOLONE SODIUM SUCC 125 MG IJ SOLR
125.0000 mg | Freq: Once | INTRAMUSCULAR | Status: AC
  Administered 2024-12-28: 125 mg via INTRAVENOUS
  Filled 2024-12-28: qty 2

## 2024-12-28 MED ORDER — MAGNESIUM SULFATE 2 GM/50ML IV SOLN
2.0000 g | Freq: Once | INTRAVENOUS | Status: AC
  Administered 2024-12-28: 2 g via INTRAVENOUS
  Filled 2024-12-28: qty 50

## 2024-12-28 MED ORDER — INSULIN ASPART 100 UNIT/ML IJ SOLN
0.0000 [IU] | Freq: Three times a day (TID) | INTRAMUSCULAR | Status: DC
  Administered 2024-12-28 – 2024-12-30 (×7): 3 [IU] via SUBCUTANEOUS
  Administered 2024-12-30: 5 [IU] via SUBCUTANEOUS
  Administered 2024-12-30 – 2024-12-31 (×2): 3 [IU] via SUBCUTANEOUS
  Administered 2024-12-31: 8 [IU] via SUBCUTANEOUS
  Administered 2024-12-31 – 2025-01-01 (×2): 3 [IU] via SUBCUTANEOUS
  Administered 2025-01-01: 8 [IU] via SUBCUTANEOUS
  Administered 2025-01-01: 2 [IU] via SUBCUTANEOUS
  Administered 2025-01-02 (×2): 3 [IU] via SUBCUTANEOUS
  Administered 2025-01-03: 8 [IU] via SUBCUTANEOUS
  Administered 2025-01-03: 11 [IU] via SUBCUTANEOUS
  Administered 2025-01-04: 8 [IU] via SUBCUTANEOUS
  Administered 2025-01-04: 3 [IU] via SUBCUTANEOUS
  Administered 2025-01-04: 11 [IU] via SUBCUTANEOUS
  Administered 2025-01-05: 15 [IU] via SUBCUTANEOUS
  Administered 2025-01-05 (×2): 5 [IU] via SUBCUTANEOUS
  Administered 2025-01-06: 2 [IU] via SUBCUTANEOUS
  Administered 2025-01-06: 5 [IU] via SUBCUTANEOUS
  Filled 2024-12-28 (×2): qty 5
  Filled 2024-12-28: qty 3
  Filled 2024-12-28: qty 5
  Filled 2024-12-28 (×2): qty 3
  Filled 2024-12-28: qty 8
  Filled 2024-12-28 (×3): qty 3
  Filled 2024-12-28: qty 11
  Filled 2024-12-28: qty 3
  Filled 2024-12-28: qty 15
  Filled 2024-12-28: qty 8
  Filled 2024-12-28: qty 11
  Filled 2024-12-28: qty 3
  Filled 2024-12-28: qty 2
  Filled 2024-12-28 (×3): qty 3
  Filled 2024-12-28: qty 2
  Filled 2024-12-28: qty 3
  Filled 2024-12-28: qty 5
  Filled 2024-12-28: qty 4
  Filled 2024-12-28: qty 3
  Filled 2024-12-28: qty 5
  Filled 2024-12-28: qty 8

## 2024-12-28 MED ORDER — ACETAMINOPHEN 325 MG PO TABS
650.0000 mg | ORAL_TABLET | Freq: Four times a day (QID) | ORAL | Status: DC | PRN
  Administered 2024-12-29 – 2025-01-02 (×4): 650 mg via ORAL
  Filled 2024-12-28 (×4): qty 2

## 2024-12-28 MED ORDER — ONDANSETRON HCL 4 MG/2ML IJ SOLN
4.0000 mg | Freq: Four times a day (QID) | INTRAMUSCULAR | Status: DC | PRN
  Administered 2024-12-31 – 2025-01-06 (×4): 4 mg via INTRAVENOUS
  Filled 2024-12-28 (×4): qty 2

## 2024-12-28 MED ORDER — ALBUTEROL SULFATE (2.5 MG/3ML) 0.083% IN NEBU
2.5000 mg | INHALATION_SOLUTION | RESPIRATORY_TRACT | Status: DC | PRN
  Administered 2024-12-29 (×2): 2.5 mg via RESPIRATORY_TRACT
  Filled 2024-12-28 (×2): qty 3

## 2024-12-28 MED ORDER — METHYLPREDNISOLONE SODIUM SUCC 40 MG IJ SOLR
40.0000 mg | Freq: Two times a day (BID) | INTRAMUSCULAR | Status: DC
  Administered 2024-12-28 – 2024-12-31 (×6): 40 mg via INTRAVENOUS
  Filled 2024-12-28 (×6): qty 1

## 2024-12-28 MED ORDER — TRAZODONE HCL 50 MG PO TABS
25.0000 mg | ORAL_TABLET | Freq: Every evening | ORAL | Status: DC | PRN
  Administered 2024-12-29: 25 mg via ORAL
  Filled 2024-12-28 (×2): qty 1

## 2024-12-28 MED ORDER — FUROSEMIDE 10 MG/ML IJ SOLN
40.0000 mg | Freq: Once | INTRAMUSCULAR | Status: AC
  Administered 2024-12-28: 40 mg via INTRAVENOUS
  Filled 2024-12-28: qty 4

## 2024-12-28 MED ORDER — ORAL CARE MOUTH RINSE: 15.0000 mL | OROMUCOSAL | Status: DC | PRN

## 2024-12-28 MED ORDER — ONDANSETRON HCL 4 MG PO TABS
4.0000 mg | ORAL_TABLET | Freq: Four times a day (QID) | ORAL | Status: DC | PRN
  Administered 2025-01-02: 4 mg via ORAL
  Filled 2024-12-28: qty 1

## 2024-12-28 MED ORDER — INSULIN ASPART 100 UNIT/ML IJ SOLN
0.0000 [IU] | Freq: Every day | INTRAMUSCULAR | Status: DC
  Administered 2024-12-28 – 2025-01-05 (×3): 2 [IU] via SUBCUTANEOUS
  Filled 2024-12-28 (×3): qty 2
  Filled 2024-12-28: qty 3

## 2024-12-28 MED ORDER — GUAIFENESIN-DM 100-10 MG/5ML PO SYRP
5.0000 mL | ORAL_SOLUTION | ORAL | Status: DC | PRN
  Administered 2024-12-29 – 2025-01-02 (×6): 5 mL via ORAL
  Filled 2024-12-28 (×5): qty 10
  Filled 2024-12-28: qty 5

## 2024-12-28 MED ORDER — ACETAMINOPHEN 650 MG RE SUPP: 650.0000 mg | Freq: Four times a day (QID) | RECTAL | Status: DC | PRN

## 2024-12-28 MED ORDER — HEPARIN SODIUM (PORCINE) 5000 UNIT/ML IJ SOLN
5000.0000 [IU] | Freq: Three times a day (TID) | INTRAMUSCULAR | Status: DC
  Administered 2024-12-28 – 2025-01-06 (×28): 5000 [IU] via SUBCUTANEOUS
  Filled 2024-12-28 (×28): qty 1

## 2024-12-28 MED ORDER — IOHEXOL 350 MG/ML SOLN
50.0000 mL | Freq: Once | INTRAVENOUS | Status: AC | PRN
  Administered 2024-12-28: 50 mL via INTRAVENOUS

## 2024-12-28 MED ORDER — SODIUM CHLORIDE 0.9 % IV SOLN
1.0000 g | Freq: Once | INTRAVENOUS | Status: AC
  Administered 2024-12-28: 1 g via INTRAVENOUS
  Filled 2024-12-28: qty 10

## 2024-12-28 MED ORDER — IPRATROPIUM-ALBUTEROL 0.5-2.5 (3) MG/3ML IN SOLN
3.0000 mL | Freq: Four times a day (QID) | RESPIRATORY_TRACT | Status: DC
  Administered 2024-12-28 – 2025-01-04 (×28): 3 mL via RESPIRATORY_TRACT
  Filled 2024-12-28 (×28): qty 3

## 2024-12-28 MED ORDER — IPRATROPIUM-ALBUTEROL 0.5-2.5 (3) MG/3ML IN SOLN
6.0000 mL | RESPIRATORY_TRACT | Status: DC
  Administered 2024-12-28 (×2): 3 mL via RESPIRATORY_TRACT
  Filled 2024-12-28 (×2): qty 6

## 2024-12-28 NOTE — H&P (Signed)
 " History and Physical  [name] [name] FMW:[id number] DOB: [birth year] DOA: 12/28/2024  PCP: Diona Perkins, MD   Chief Complaint: Cough, shortness of breath  HPI: [name] [name] is a 79 y.o. female with medical history significant for chronic heart failure with preserved EF, CKD, CLL, ongoing tobacco abuse, COPD on room air, diabetes, hypertension, hyperlipidemia who is being admitted to the hospital with acute hypoxic respiratory failure due to acute COPD exacerbation.  Patient states that her husband was diagnosed with COVID this past week, he was coughing and not covering his mouth, she started getting sick a couple of days ago.  She denies any chest pain, or fevers but has had shortness of breath, wheezing and nonproductive cough.  Denies any nausea vomiting, diarrhea or abdominal pain or any other concerns.  Review of Systems: Please see HPI for pertinent positives and negatives. A complete 10 system review of systems are otherwise negative.  Past Medical History:  Diagnosis Date   Allergy    environmental   Asthma    Brain tumor (HCC)    Chronic diastolic CHF (congestive heart failure) (HCC)    CKD (chronic kidney disease)    CLL (chronic lymphocytic leukemia) (HCC)    Constipation 11/15/2011   COPD (chronic obstructive pulmonary disease) (HCC)    CVA (cerebral vascular accident) (HCC)    Diabetes (HCC)    GERD (gastroesophageal reflux disease)    Headache    migraines   Hyperlipidemia    Hypertension    Pneumonia    PVD (peripheral vascular disease)    Stroke (HCC) 1998   in 1998 due to tumor-right side   Past Surgical History:  Procedure Laterality Date   ABDOMINAL AORTOGRAM W/LOWER EXTREMITY Bilateral 08/12/2020   Procedure: ABDOMINAL AORTOGRAM W/BILATERAL LOWER EXTREMITY RUNOFF;  Surgeon: Serene Gaile ORN, MD;  Location: [facility];  Service: Cardiovascular;  Laterality: Bilateral;   ABDOMINAL AORTOGRAM W/LOWER EXTREMITY N/A 06/05/2024   Procedure: ABDOMINAL  AORTOGRAM W/LOWER EXTREMITY;  Surgeon: Serene Gaile ORN, MD;  Location: [facility];  Service: Cardiovascular;  Laterality: N/A;   ABI  02/2012   ABI <0.65 BL 02/2012   BRAIN MENINGIOMA EXCISION     BRAIN TUMOR EXCISION     Bypass grafting of RLE for PAD claudication      CARDIAC CATHETERIZATION  04/09/208   CESAREAN SECTION     1974, 78 ,80   CHOLECYSTECTOMY, LAPAROSCOPIC     COLONOSCOPY  2014   [place], [place]   ENDARTERECTOMY FEMORAL Right 10/03/2020   Procedure: RIGHT FEMORAL ENDARTERECTOMY WITH VEIN PATCH ANGIOPLASTY;  Surgeon: Serene Gaile ORN, MD;  Location: [facility];  Service: Vascular;  Laterality: Right;   ESOPHAGEAL DILATION     FEMORAL-POPLITEAL BYPASS GRAFT Right 10/03/2020   Procedure: RIGHT FEMORAL- BELOW KNEE POPLITEAL BYPASS USING GORE PROPATEN GRAFT;  Surgeon: Serene Gaile ORN, MD;  Location: [facility];  Service: Vascular;  Laterality: Right;   LOWER EXTREMITY ANGIOGRAPHY N/A 06/05/2024   Procedure: Lower Extremity Angiography;  Surgeon: Serene Gaile ORN, MD;  Location: [facility];  Service: Cardiovascular;  Laterality: N/A;   PATCH ANGIOPLASTY  10/03/2020   Procedure: VEIN PATCH ANGIOPLASTY;  Surgeon: Serene Gaile ORN, MD;  Location: [facility];  Service: Vascular;;   Social History:  reports that she has been smoking cigarettes. She started smoking about 49 years ago. She has a 12.2 pack-year smoking history. She has been exposed to tobacco smoke. She has never used smokeless tobacco. She reports that  she does not currently use alcohol. She reports that she does not currently use drugs.  Allergies[1]  Family History  Problem Relation Age of Onset   Heart disease Mother    Asthma Mother    Cancer Mother        uterine    Depression Mother    Heart attack Mother 56   Hyperlipidemia Mother    Hypertension Mother    Stroke Mother    Kidney disease Mother    Heart attack Sister    Stroke Sister    Depression Sister    Diabetes Sister    Hyperlipidemia Sister     Depression Sister    Diabetes Sister    Hyperlipidemia Sister    HIV/AIDS Sister    Diabetes Brother    Asthma Brother    Hypertension Brother    Cancer Brother        colon   HIV/AIDS Brother    Cancer Maternal Aunt        lung   Heart disease Maternal Grandmother    Heart attack Maternal Grandmother    Hypertension Daughter    Colon cancer Neg Hx    Neuropathy Neg Hx      Prior to Admission medications  Medication Sig Start Date End Date Taking? Authorizing Provider  acetaminophen  (TYLENOL ) 500 MG tablet Take 500-1,000 mg by mouth every 6 (six) hours as needed for moderate pain.    [provider]  albuterol  (PROVENTIL ) (2.5 MG/3ML) 0.083% nebulizer solution Inhale 1 vial via nebulizer every 6 hours for wheezing or shortness of breath as needed 05/29/24   Diona Perkins, MD  albuterol  (VENTOLIN  HFA) 108 (90 Base) MCG/ACT inhaler Inhale 2 puffs by mouth into the lungs every 4 hours as needed for wheezing/shortness of breath 05/10/24   Diona Perkins, MD  Alcohol Swabs (ALCOHOL PADS) 70 % PADS  11/27/20   [provider]  amLODipine  (NORVASC ) 10 MG tablet Take 1 tablet by mouth every day 04/17/24   Diona Perkins, MD  atorvastatin  (LIPITOR ) 80 MG tablet Take 1 tablet by mouth daily 03/19/24   Diona Perkins, MD  Blood Glucose Calibration (OT ULTRA/FASTTK CNTRL SOLN) SOLN See admin instructions. 11/27/20   [provider]  blood glucose meter kit and supplies KIT Dispense based on patient and insurance preference. Use up to four times daily as directed. 05/31/22   Malvina Ellen, MD  Blood Glucose Monitoring Suppl (ACCU-CHEK GUIDE ME) w/Device KIT Use to test blood sugar twice per day. E11.9 06/03/22   Malvina Ellen, MD  buPROPion  (WELLBUTRIN  XL) 150 MG 24 hr tablet Take 1 tablet (150 mg total) by mouth daily. For smoking cessation 11/07/24   Lovorn, Megan, MD  cholecalciferol (VITAMIN D3) 25 MCG (1000 UNIT) tablet Take 1,000 Units by mouth once a week.    [provider]  clopidogrel  (PLAVIX ) 75 MG tablet Take 1 tablet by mouth every day 04/17/24   Diona Perkins, MD  DULoxetine  (CYMBALTA ) 60 MG capsule Take 1 capsule by mouth every day 07/05/24   Diona Perkins, MD  famotidine  (PEPCID ) 20 MG tablet Take 1 tablet (20 mg total) by mouth at bedtime. 09/28/24   Diona Perkins, MD  FERREX 150 150 MG capsule Take 1 capsule by mouth every day 07/24/24   Diona Perkins, MD  furosemide  (LASIX ) 20 MG tablet Take 1 tablet by mouth every day 07/24/24   Diona Perkins, MD  gabapentin  (NEURONTIN ) 600 MG tablet Take 1 tablet (600 mg total) by mouth at  bedtime. 11/07/24   Lovorn, Megan, MD  glucose blood (ONETOUCH VERIO) test strip Use to check blood sugar three times daily. 04/26/23   Malvina Ellen, MD  JARDIANCE  25 MG TABS tablet Take 1 tablet by mouth every day 05/29/24   Diona Perkins, MD  Lancet Devices (EASY MINI EJECT LANCING DEVICE) MISC daily. 11/27/20   [provider]  Lancets (ONETOUCH DELICA PLUS LANCET33G) MISC USE   TO CHECK GLUCOSE ONCE DAILY Patient taking differently: USE   TO CHECK GLUCOSE ONCE DAILY 04/14/23   Malvina Ellen, MD  lidocaine  (LIDODERM ) 5 % Apply 1 patch to affected area every day as needed for pain. Remove after 12 hr 12/03/24   Diona Perkins, MD  losartan  (COZAAR ) 50 MG tablet Take 1 tablet by mouth at bedtime 04/17/24   Diona Perkins, MD  metFORMIN  (GLUCOPHAGE -XR) 500 MG 24 hr tablet Take 2 tabs by mouth daily with breakfast. Need appt 04/17/24   Diona Perkins, MD  montelukast  (SINGULAIR ) 10 MG tablet Take 1 tablet by mouth at bedtime 05/29/24   Diona Perkins, MD  nitroGLYCERIN  (NITROSTAT ) 0.4 MG SL tablet Dissolve 1 tablet under tongue every 5 minutes, up to 3 doses for chest pain 07/24/24   Diona Perkins, MD  tiZANidine  (ZANAFLEX ) 2 MG tablet Take 1 tablet by mouth every 8 hours as needed for muscle spasms 12/24/24   Diona Perkins, MD  TRELEGY ELLIPTA  100-62.5-25 MCG/ACT AEPB Inhale 1 puff by mouth every day 06/07/24   Diona Perkins, MD  TRULICITY  0.75 MG/0.5ML  EMMANUEL Inject 0.75 mg subcutaneously once a week 04/17/24   Diona Perkins, MD  varenicline  (CHANTIX ) 1 MG tablet Take 1 tablet twice daily. If having bad side effects, can reduce to once per day 12/24/24   Diona Perkins, MD    Physical Exam: BP 138/65 (BP Location: Right Arm)   Pulse 65   Temp 98.3 F (36.8 C) (Oral)   Resp 14   SpO2 96%  General:  Alert, oriented, calm, in no acute distress, currently wearing high flow nasal cannula oxygen .  Intermittent wet sounding cough, speaking several words at a time. Cardiovascular: RRR, no murmurs or rubs, no peripheral edema  Respiratory: Mild tachypnea and diffuse rhonchi Abdomen: soft, nontender, nondistended, normal bowel tones heard  Skin: dry, no rashes  Musculoskeletal: no joint effusions, normal range of motion  Psychiatric: appropriate affect, normal speech  Neurologic: extraocular muscles intact, clear speech, moving all extremities with intact sensorium         Labs on Admission:  Basic Metabolic Panel: Recent Labs  Lab 12/28/24 0321  NA 143  K 4.6  CL 105  CO2 28  GLUCOSE 150*  BUN 13  CREATININE 1.60*  CALCIUM  9.5   Liver Function Tests: Recent Labs  Lab 12/28/24 0321  AST 39  ALT 33  ALKPHOS 243*  BILITOT 0.4  PROT 6.4*  ALBUMIN  4.0   No results for input(s): LIPASE, AMYLASE in the last 168 hours. No results for input(s): AMMONIA in the last 168 hours. CBC: Recent Labs  Lab 12/28/24 0321  WBC 61.9*  NEUTROABS 16.7*  HGB 12.2  HCT 41.1  MCV 94.3  PLT 127*   Cardiac Enzymes: No results for input(s): CKTOTAL, CKMB, CKMBINDEX, TROPONINI in the last 168 hours. BNP (last 3 results) Recent Labs    02/03/24 1508 04/11/24 0213  BNP 22.2 12.8    ProBNP (last 3 results) No results for input(s): PROBNP in the last 8760 hours.  CBG: No results for input(s): GLUCAP in  the last 168 hours.  Radiological Exams on Admission: DG Chest Portable 1 View Result Date: 12/28/2024 EXAM: 1 VIEW(S)  XRAY OF THE CHEST 12/28/2024 03:19:00 AM COMPARISON: 02/18/2024 CLINICAL HISTORY: SOB FINDINGS: LUNGS AND PLEURA: No focal pulmonary opacity. No pleural effusion. No pneumothorax. HEART AND MEDIASTINUM: No acute abnormality of the cardiac and mediastinal silhouettes. BONES AND SOFT TISSUES: No acute osseous abnormality. IMPRESSION: 1. No acute findings. Electronically signed by: Franky Stanford MD 12/28/2024 03:59 AM EST RP Workstation: [id number]   Assessment/Plan [name] [name] is a 79 y.o. female with medical history significant for chronic heart failure with preserved EF, CKD, CLL, ongoing tobacco abuse, COPD on room air, diabetes, hypertension, hyperlipidemia who is being admitted to the hospital with acute hypoxic respiratory failure due to acute COPD exacerbation.  Acute hypoxic respiratory failure-without evidence of heart failure or bacterial infection.  Given her diffuse rhonchi and wheezing, likely due to acute exacerbation of COPD.  However, given her history of CLL and significant hypoxia, will need to rule out PE. -Inpatient admission -Monitor closely on stepdown -Supplemental oxygen , wean as tolerated -Check stat CT PE study -Check 20 pathogen viral respiratory panel  Acute exacerbation of COPD-with increased cough, wheezing.  Likely instigated by viral illness.  Received 125 mg IV Solu-Medrol , as well as IV magnesium . -Continue IV steroid therapy -Scheduled DuoNebs, as needed albuterol  nebulizer -Incentive spirometry, and flutter valve  Acute on chronic heart failure with preserved EF-patient looks slightly volume overloaded on exam -Check BNP -Will give a dose of IV Lasix  now  CKD stage IIIa-renal function appears to be essentially at baseline, will monitor closely with diuresis  Hyperlipidemia-Lipitor   Hypertension-Norvasc , losartan   Type 2 diabetes-carb modified diet, with moderate dose sliding scale  CLL-patient was scheduled for PET scan today, is followed by Dr.  Onesimo -Outpatient oncology team notified of her admission  DVT prophylaxis: Subcutaneous heparin     Code Status: Full Code  Consults called: None  Admission status: The appropriate patient status for this patient is INPATIENT. Inpatient status is judged to be reasonable and necessary in order to provide the required intensity of service to ensure the patient's safety. The patient's presenting symptoms, physical exam findings, and initial radiographic and laboratory data in the context of their chronic comorbidities is felt to place them at high risk for further clinical deterioration. Furthermore, it is not anticipated that the patient will be medically stable for discharge from the hospital within 2 midnights of admission.    I certify that at the point of admission it is my clinical judgment that the patient will require inpatient hospital care spanning beyond 2 midnights from the point of admission due to high intensity of service, high risk for further deterioration and high frequency of surveillance required  Time spent: 59 minutes  [name] Vinal CHRISTELLA Gail MD [facility] Pager [phone]  If 7PM-7AM, please contact night-coverage [email or web address] Password [facility]  12/28/2024, 8:36 AM      [1]  Allergies Allergen Reactions   Gazyva  [Obinutuzumab ] Shortness Of Breath    Acute respiratory distress   Aspirin      Abdominal pain.    No [employer] Food Allergies Diarrhea and Nausea And Vomiting    Cabbage, stomach pain    Aspirin  Other (See Comments)    irritates stomach   Baclofen  Other (See Comments)    Stomach irritation   "

## 2024-12-28 NOTE — ED Triage Notes (Signed)
 Pt BIB [facility]. Weak with body aches x3 days. Pt believes she has COVID from husband. Pt took inhaler PTA. EMS heard wheezing in lungs. Pt on 3L on arrival. Pt presents with cold and cough. Temp 100.0. CBG 169.  Hx stroke, hypertension, COPD.

## 2024-12-28 NOTE — Progress Notes (Addendum)
 [name] [name]   DOB:06/30/1946   FM#:[id number]      CLINICAL SUMMARY:  [name] [name] is a 79 year old female patient who presented to the ED 12/28/2024 with complaints of cough and shortness of breath.  Oncologic history is significant for CLL.  Medical oncology following.    ASSESSMENT & PLAN:  Shortness of breath, progressive Cough Acute hypoxic respiratory failure Acute exacerbation of COPD - Patient reports that she has had 3 weeks of progressive shortness of breath with wheezing.  Admits to spouse having COVID recently. - On HFNC, continue -Continue IV steroids as ordered - Continue respiratory therapy as ordered - CT angio chest done today, pending results - Monitor respiratory status closely  History of chronic lymphocytic leukemia - Previously treated with Gazyva  + venetoclax  - Currently on surveillance - Most recently seen on 12/07/2024 in [facility]. - PET CT was ordered and scheduled to be done today 12/28/2024.  Unable to perform PET/CT during hospitalization therefore ordered CT abdomen pelvis to be done tomorrow 12/29/2024 as received contrast earlier this morning for CT chest.   - LDH continues to rise. - Primary oncologist is Dr. Onesimo who will continue to manage malignancy.  CKD - Appears to be at baseline - Avoid nephrotoxic agents - Continue to monitor renal function  Thrombocytopenia - Mild - Platelets 127K - No intervention required at this time - Continue to monitor CBC with differential  Hypertension Hyperlipidemia Diabetes - Monitor blood pressure closely and administer antihypertensives - On Lipitor  - Monitor blood glucose levels and administer insulin  per protocol    Code Status Full  Subjective:  Patient seen awake and alert laying in bed in the ED.  HFNC intact.  Reports that she has been progressively short of breath with wheezing for about 3 weeks.  She denies chest pain.  States that her husband had COVID at home.  Objective:  No intake or  output data in the 24 hours ending 12/28/24 1032   PHYSICAL EXAMINATION: ECOG PERFORMANCE STATUS: 2 - Symptomatic, <50% confined to bed  Vitals:   12/28/24 0526 12/28/24 0650  BP:  138/65  Pulse: 69 65  Resp: 19 14  Temp:  98.3 F (36.8 C)  SpO2: 92% 96%   There were no vitals filed for this visit.  GENERAL: alert, +mild respiratory distress w/HFNC intact SKIN: skin color, texture, turgor are normal, no rashes or significant lesions EYES: normal, conjunctiva are pink and non-injected, sclera clear OROPHARYNX: no exudate, no erythema and lips, buccal mucosa, and tongue normal  NECK: supple, thyroid  normal size, non-tender, without nodularity LYMPH: no palpable lymphadenopathy in the cervical, axillary or inguinal LUNGS: clear to auscultation and percussion with normal breathing effort HEART: regular rate & rhythm and no murmurs and no lower extremity edema ABDOMEN: abdomen soft, non-tender and normal bowel sounds MUSCULOSKELETAL: no cyanosis of digits and no clubbing  PSYCH: alert & oriented x 3 with fluent speech NEURO: no focal motor/sensory deficits   All questions were answered. The patient knows to call the clinic with any problems, questions or concerns.   I personally spent a total of 40 minutes minutes in the care of the patient today including preparing to see the patient, getting/reviewing separately obtained history, performing a medically appropriate exam/evaluation, counseling and educating, placing orders, referring and communicating with other health care professionals, documenting clinical information in the EHR, communicating results, and coordinating care.    Olam PARAS Rennie Hack, NP 12/28/2024 10:32 AM    Labs Reviewed:  Lab Results  Component Value Date   WBC 61.9 (HH) 12/28/2024   HGB 12.2 12/28/2024   HCT 41.1 12/28/2024   MCV 94.3 12/28/2024   PLT 127 (L) 12/28/2024   Recent Labs    08/17/24 1457 12/07/24 1229 12/28/24 0321  NA 143 143 143  K 4.5  3.9 4.6  CL 108 104 105  CO2 31 25 28   GLUCOSE 142* 164* 150*  BUN 10 14 13   CREATININE 1.29* 1.76* 1.60*  CALCIUM  8.6* 10.1 9.5  GFRNONAA 43* 29* 33*  PROT 5.9* 6.6 6.4*  ALBUMIN  4.1 4.4 4.0  AST 25 37 39  ALT 20 16 33  ALKPHOS 152* 204* 243*  BILITOT 0.3 0.4 0.4    Studies Reviewed:   DG Chest Portable 1 View Result Date: 12/28/2024 EXAM: 1 VIEW(S) XRAY OF THE CHEST 12/28/2024 03:19:00 AM COMPARISON: 02/18/2024 CLINICAL HISTORY: SOB FINDINGS: LUNGS AND PLEURA: No focal pulmonary opacity. No pleural effusion. No pneumothorax. HEART AND MEDIASTINUM: No acute abnormality of the cardiac and mediastinal silhouettes. BONES AND SOFT TISSUES: No acute osseous abnormality. IMPRESSION: 1. No acute findings. Electronically signed by: Franky Stanford MD 12/28/2024 03:59 AM EST RP Workstation: [id number]

## 2024-12-28 NOTE — ED Provider Notes (Signed)
 " [place] [facility] AT [facility] Provider Note   CSN: [id number] Arrival date & time: 12/28/24  0219     History Chief Complaint  Patient presents with   Weakness    HPI [name] is a 79 y.o. female presenting for chief complaint of acute respiratory distress.  She states that she has a longstanding history of smoking and gets sick with URIs but not typically this sick.  EMS was called out due to hypoxia in the 70s. Started on 3 L nasal cannula, gave bronchodilators with only moderate response at first.  Patient states that her husband was diagnosed with COVID earlier in the week.   Patient's recorded medical, surgical, social, medication list and allergies were reviewed in the Snapshot window as part of the initial history.   Review of Systems   Review of Systems  Constitutional:  Negative for chills and fever.  HENT:  Negative for ear pain and sore throat.   Eyes:  Negative for pain and visual disturbance.  Respiratory:  Positive for cough, shortness of breath and wheezing.   Cardiovascular:  Negative for chest pain and palpitations.  Gastrointestinal:  Negative for abdominal pain and vomiting.  Genitourinary:  Negative for dysuria and hematuria.  Musculoskeletal:  Negative for arthralgias and back pain.  Skin:  Negative for color change and rash.  Neurological:  Negative for seizures and syncope.  All other systems reviewed and are negative.   Physical Exam Updated Vital Signs BP 133/72 (BP Location: Right Arm)   Pulse 69   Temp 98.2 F (36.8 C) (Oral)   Resp 19   SpO2 92%  Physical Exam Vitals and nursing note reviewed.  Constitutional:      General: She is not in acute distress.    Appearance: She is well-developed.  HENT:     Head: Normocephalic and atraumatic.  Eyes:     Conjunctiva/sclera: Conjunctivae normal.  Cardiovascular:     Rate and Rhythm: Normal rate and regular rhythm.     Heart sounds: No murmur  heard. Pulmonary:     Effort: Pulmonary effort is normal. No respiratory distress.     Breath sounds: Normal breath sounds.  Abdominal:     Palpations: Abdomen is soft.     Tenderness: There is no abdominal tenderness.  Musculoskeletal:        General: No swelling.     Cervical back: Neck supple.  Skin:    General: Skin is warm and dry.     Capillary Refill: Capillary refill takes less than 2 seconds.  Neurological:     Mental Status: She is alert.  Psychiatric:        Mood and Affect: Mood normal.      ED Course/ Medical Decision Making/ A&P    Procedures .Critical Care  Performed by: Jerral Meth, MD Authorized by: Jerral Meth, MD   Critical care provider statement:    Critical care time (minutes):  30   Critical care was necessary to treat or prevent imminent or life-threatening deterioration of the following conditions:  Respiratory failure (Hypoxia to the 60%s)   Critical care was time spent personally by me on the following activities:  Development of treatment plan with patient or surrogate, discussions with consultants, evaluation of patient's response to treatment, examination of patient, ordering and review of laboratory studies, ordering and review of radiographic studies, ordering and performing treatments and interventions, pulse oximetry, re-evaluation of patient's condition and review of old charts   Care  discussed with: admitting provider      Medications Ordered in ED Medications  ipratropium-albuterol  (DUONEB) 0.5-2.5 (3) MG/3ML nebulizer solution 6 mL (3 mLs Nebulization Given 12/28/24 0503)  methylPREDNISolone  sodium succinate (SOLU-MEDROL ) 125 mg/2 mL injection 125 mg (125 mg Intravenous Given 12/28/24 0458)  cefTRIAXone  (ROCEPHIN ) 1 g in sodium chloride  0.9 % 100 mL IVPB (0 g Intravenous Stopped 12/28/24 0527)  magnesium  sulfate IVPB 2 g 50 mL (0 g Intravenous Stopped 12/28/24 0622)   Medical Decision Making:   [name] [name] is a 79 y.o. female  with a history of COPD, who presented to the ED today with acute on chronic SOB. They are endorsing worsening of their baseline dyspnea over the past 48 hours. Their baseline is a 0L O2 requirement. At their baseline they are able to get around the neighborhood and they are not able to at this time.   On my initial exam, the pt was SOB and tachypneic. Audible wheezing and grossly decreased breath sounds appreciated.  They are endorsing increased sputum production.    Reviewed and confirmed nursing documentation for past medical history, family history, social history.    Initial Assessment:   With the patient's presentation of SOB in the above setting, most likely diagnosis is COPD Exacerbation. Other diagnoses were considered including (but not limited to) CAP, PE, ACS, viral infection, PTX. These are considered less likely due to history of present illness and physical exam findings.   This is most consistent with an acute life/limb threatening illness complicated by underlying chronic conditions.  Initial Plan:  Empiric treatment of patient's symptoms with immediate initiation of inhaled bronchodilators and IV steroids. Given advanced nature of patient's presentation, will proceed with IV magnesium  as a rescue therapy.   Given extremis nature of the presentation, patient required non invasive ventilation initiation.  Evaluation for ACS with EKG and delta troponin  Evaluation for infectious versus intrathoracic abnormality with chest x-ray  Evaluation for volume overload with BNP  Screening labs including CBC and Metabolic panel to evaluate for infectious or metabolic etiology of disease.  Patient's Wells score is low and patient does not warrant further objective evaluation for PE based on consistency of presentation of alternative diagnosis.  Objective evaluation as below reviewed   Initial Study Results:   Laboratory  All laboratory results reviewed without evidence of clinically relevant  pathology.    EKG EKG was reviewed independently. Rate, rhythm, axis, intervals all examined and without medically relevant abnormality. ST segments without concerns for elevations.    Radiology:  All images reviewed independently. Agree with radiology report at this time.   DG Chest Portable 1 View Result Date: 12/28/2024 EXAM: 1 VIEW(S) XRAY OF THE CHEST 12/28/2024 03:19:00 AM COMPARISON: 02/18/2024 CLINICAL HISTORY: SOB FINDINGS: LUNGS AND PLEURA: No focal pulmonary opacity. No pleural effusion. No pneumothorax. HEART AND MEDIASTINUM: No acute abnormality of the cardiac and mediastinal silhouettes. BONES AND SOFT TISSUES: No acute osseous abnormality. IMPRESSION: 1. No acute findings. Electronically signed by: Franky Stanford MD 12/28/2024 03:59 AM EST RP Workstation: [id number]     .   Final Assessment and Plan:   After initiation of medical therapies, patient is grossly improved and no longer in acute distress.    Given the advanced nature of the patient's presentation, patient will require advanced care and admission was arranged.      Clinical Impression:  1. Wheezing   2. Acute hypoxic respiratory failure (HCC)      Data Unavailable   Final Clinical Impression(s) /  ED Diagnoses Final diagnoses:  Wheezing  Acute hypoxic respiratory failure [facility])    Rx / DC Orders ED Discharge Orders     None         Jerral Meth, MD 12/28/24 [phone]  "

## 2024-12-29 ENCOUNTER — Inpatient Hospital Stay (HOSPITAL_COMMUNITY)

## 2024-12-29 ENCOUNTER — Encounter (HOSPITAL_COMMUNITY): Payer: Self-pay | Admitting: Internal Medicine

## 2024-12-29 DIAGNOSIS — J441 Chronic obstructive pulmonary disease with (acute) exacerbation: Secondary | ICD-10-CM | POA: Diagnosis not present

## 2024-12-29 LAB — BASIC METABOLIC PANEL WITH GFR
Anion gap: 13 (ref 5–15)
BUN: 22 mg/dL (ref 8–23)
CO2: 27 mmol/L (ref 22–32)
Calcium: 9.4 mg/dL (ref 8.9–10.3)
Chloride: 106 mmol/L (ref 98–111)
Creatinine, Ser: 1.58 mg/dL — ABNORMAL HIGH (ref 0.44–1.00)
GFR, Estimated: 33 mL/min — ABNORMAL LOW
Glucose, Bld: 176 mg/dL — ABNORMAL HIGH (ref 70–99)
Potassium: 4.7 mmol/L (ref 3.5–5.1)
Sodium: 145 mmol/L (ref 135–145)

## 2024-12-29 LAB — CBC
HCT: 40.7 % (ref 36.0–46.0)
Hemoglobin: 12.6 g/dL (ref 12.0–15.0)
MCH: 29.2 pg (ref 26.0–34.0)
MCHC: 31 g/dL (ref 30.0–36.0)
MCV: 94.2 fL (ref 80.0–100.0)
Platelets: 130 K/uL — ABNORMAL LOW (ref 150–400)
RBC: 4.32 MIL/uL (ref 3.87–5.11)
RDW: 18.7 % — ABNORMAL HIGH (ref 11.5–15.5)
WBC: 62.3 K/uL (ref 4.0–10.5)
nRBC: 0.2 % (ref 0.0–0.2)

## 2024-12-29 LAB — GLUCOSE, CAPILLARY
Glucose-Capillary: 173 mg/dL — ABNORMAL HIGH (ref 70–99)
Glucose-Capillary: 192 mg/dL — ABNORMAL HIGH (ref 70–99)
Glucose-Capillary: 189 mg/dL — ABNORMAL HIGH (ref 70–99)
Glucose-Capillary: 236 mg/dL — ABNORMAL HIGH (ref 70–99)

## 2024-12-29 MED ORDER — DULOXETINE HCL 30 MG PO CPEP
60.0000 mg | ORAL_CAPSULE | Freq: Every day | ORAL | Status: DC
  Administered 2024-12-29 – 2025-01-06 (×9): 60 mg via ORAL
  Filled 2024-12-29 (×9): qty 2

## 2024-12-29 MED ORDER — BUPROPION HCL ER (XL) 150 MG PO TB24
150.0000 mg | ORAL_TABLET | Freq: Every day | ORAL | Status: DC
  Administered 2024-12-29 – 2025-01-06 (×9): 150 mg via ORAL
  Filled 2024-12-29 (×9): qty 1

## 2024-12-29 MED ORDER — CLOPIDOGREL BISULFATE 75 MG PO TABS
75.0000 mg | ORAL_TABLET | Freq: Every day | ORAL | Status: DC
  Administered 2024-12-29 – 2025-01-06 (×9): 75 mg via ORAL
  Filled 2024-12-29 (×9): qty 1

## 2024-12-29 MED ORDER — AMLODIPINE BESYLATE 10 MG PO TABS
10.0000 mg | ORAL_TABLET | Freq: Every day | ORAL | Status: DC
  Administered 2024-12-29 – 2025-01-06 (×9): 10 mg via ORAL
  Filled 2024-12-29 (×9): qty 1

## 2024-12-29 MED ORDER — LOSARTAN POTASSIUM 50 MG PO TABS
50.0000 mg | ORAL_TABLET | Freq: Every day | ORAL | Status: DC
  Administered 2024-12-29: 50 mg via ORAL
  Filled 2024-12-29 (×2): qty 1

## 2024-12-29 MED ORDER — ATORVASTATIN CALCIUM 40 MG PO TABS
80.0000 mg | ORAL_TABLET | Freq: Every day | ORAL | Status: DC
  Administered 2024-12-29 – 2025-01-06 (×9): 80 mg via ORAL
  Filled 2024-12-29 (×9): qty 2

## 2024-12-29 MED ORDER — HYDROCODONE BIT-HOMATROP MBR 5-1.5 MG/5ML PO SOLN
5.0000 mL | Freq: Four times a day (QID) | ORAL | Status: DC
  Administered 2024-12-29 – 2025-01-02 (×14): 5 mL via ORAL
  Filled 2024-12-29 (×14): qty 5

## 2024-12-29 MED ORDER — GABAPENTIN 300 MG PO CAPS
600.0000 mg | ORAL_CAPSULE | Freq: Every day | ORAL | Status: DC
  Administered 2024-12-30 – 2025-01-05 (×7): 600 mg via ORAL
  Filled 2024-12-29 (×8): qty 2

## 2024-12-29 MED ORDER — MONTELUKAST SODIUM 10 MG PO TABS
10.0000 mg | ORAL_TABLET | Freq: Every day | ORAL | Status: DC
  Administered 2024-12-29 – 2025-01-05 (×8): 10 mg via ORAL
  Filled 2024-12-29 (×8): qty 1

## 2024-12-29 MED ORDER — IOHEXOL 300 MG/ML  SOLN
100.0000 mL | Freq: Once | INTRAMUSCULAR | Status: AC | PRN
  Administered 2024-12-29: 70 mL via INTRAVENOUS

## 2024-12-29 NOTE — Progress Notes (Signed)
 Bipap not needed at this time. Patient is on HHFNC sating well.

## 2024-12-29 NOTE — Progress Notes (Signed)
 " [facility] Progress Note  Patient: [name] [name]     FMW:[id number]  DOA: 12/28/2024   PCP: Diona Perkins, MD       Brief hospital course: 79 y/o with copd, CKD, CLL with ongoing tobacco abuse presents for shortness of breath. Her husband was diagnosed with COVID last week and he was coughing and not covering his mouth and now she also has a cough and has been wheezing.  Subjective:  States she does not feel better yet. Still is coughing.  Assessment and Plan: Principal Problem:   COPD with acute exacerbation Acute hypoxic respiratory failure (HCC) - secondary to meta pneumovirus - on 40 L HFNC but able to speak in full sentences and in no distress - cont Solumderol 40 mg BI & Nebs  Active Problems: Nicotine  abuse - smokes 5-10 cigarettes a day  AKI CKD 3b - cont to follow Cr  - baseline ~ 1.3 - admitted with Cr 1.60    CLL (chronic lymphocytic leukemia)  - WBC count in 60,000s - currently being managed conservatively w/o medications  DM - cont Novolog  SSI Hemoglobin A1C    Component Value Date/Time   HGBA1C 8.0 (H) 12/28/2024 0846   HGBA1C 7.6 (A) 08/15/2024 0938           Code Status: Full Code Total time on patient care: 35 min DVT prophylaxis:  heparin  injection 5,000 Units Start: 12/28/24 0845     Objective:   Vitals:   12/29/24 0400 12/29/24 0635 12/29/24 0911 12/29/24 1011  BP:      Pulse:  67  77  Resp:  15  (!) 21  Temp: 97.9 F (36.6 C)  98.1 F (36.7 C)   TempSrc: Oral  Oral   SpO2:  96%  95%  Weight:      Height:       Filed Weights   12/28/24 1723  Weight: 63.9 kg   Exam: General exam: Appears comfortable  HEENT: oral mucosa moist Respiratory system: b/l rhonchi and tachypnea Cardiovascular system: S1 & S2 heard  Gastrointestinal system: Abdomen soft, non-tender, nondistended. Normal bowel sounds   Extremities: No cyanosis, clubbing or edema Psychiatry:  Mood & affect appropriate.      CBC: Recent Labs  Lab  12/28/24 0321 12/29/24 0234  WBC 61.9* 62.3*  NEUTROABS 16.7*  --   HGB 12.2 12.6  HCT 41.1 40.7  MCV 94.3 94.2  PLT 127* 130*   Basic Metabolic Panel: Recent Labs  Lab 12/28/24 0321 12/29/24 0234  NA 143 145  K 4.6 4.7  CL 105 106  CO2 28 27  GLUCOSE 150* 176*  BUN 13 22  CREATININE 1.60* 1.58*  CALCIUM  9.5 9.4     Scheduled Meds:  Chlorhexidine  Gluconate Cloth  6 each Topical Q0600   heparin   5,000 Units Subcutaneous Q8H   insulin  aspart  0-15 Units Subcutaneous TID WC   insulin  aspart  0-5 Units Subcutaneous QHS   ipratropium-albuterol   3 mL Nebulization QID   methylPREDNISolone  (SOLU-MEDROL ) injection  40 mg Intravenous Q12H    Imaging and lab data personally reviewed   Author: Jhamir Pickup  12/29/2024 10:46 AM  To contact [facility]>   Check the care team in [facility] and look for the attending/consulting [facility] provider listed  Log into [email or web address] and use [place]'s universal password   Go to> [facility]  and find provider  If you still have difficulty reaching the provider, please page the [facility] (Director on Call) for the  Hospitalists listed on amion     "

## 2024-12-30 DIAGNOSIS — J441 Chronic obstructive pulmonary disease with (acute) exacerbation: Secondary | ICD-10-CM | POA: Diagnosis not present

## 2024-12-30 LAB — CBC
HCT: 38.7 % (ref 36.0–46.0)
Hemoglobin: 11.6 g/dL — ABNORMAL LOW (ref 12.0–15.0)
MCH: 28.4 pg (ref 26.0–34.0)
MCHC: 30 g/dL (ref 30.0–36.0)
MCV: 94.9 fL (ref 80.0–100.0)
Platelets: 134 K/uL — ABNORMAL LOW (ref 150–400)
RBC: 4.08 MIL/uL (ref 3.87–5.11)
RDW: 18.6 % — ABNORMAL HIGH (ref 11.5–15.5)
WBC: 58.9 K/uL (ref 4.0–10.5)
nRBC: 0.2 % (ref 0.0–0.2)

## 2024-12-30 LAB — GLUCOSE, CAPILLARY
Glucose-Capillary: 201 mg/dL — ABNORMAL HIGH (ref 70–99)
Glucose-Capillary: 171 mg/dL — ABNORMAL HIGH (ref 70–99)
Glucose-Capillary: 197 mg/dL — ABNORMAL HIGH (ref 70–99)
Glucose-Capillary: 112 mg/dL — ABNORMAL HIGH (ref 70–99)

## 2024-12-30 LAB — BASIC METABOLIC PANEL WITH GFR
Anion gap: 9 (ref 5–15)
BUN: 24 mg/dL — ABNORMAL HIGH (ref 8–23)
CO2: 28 mmol/L (ref 22–32)
Calcium: 9.4 mg/dL (ref 8.9–10.3)
Chloride: 104 mmol/L (ref 98–111)
Creatinine, Ser: 1.38 mg/dL — ABNORMAL HIGH (ref 0.44–1.00)
GFR, Estimated: 39 mL/min — ABNORMAL LOW
Glucose, Bld: 219 mg/dL — ABNORMAL HIGH (ref 70–99)
Potassium: 5.4 mmol/L — ABNORMAL HIGH (ref 3.5–5.1)
Sodium: 142 mmol/L (ref 135–145)

## 2024-12-30 MED ORDER — REVEFENACIN 175 MCG/3ML IN SOLN
175.0000 ug | Freq: Every day | RESPIRATORY_TRACT | Status: DC
  Administered 2024-12-30 – 2025-01-06 (×8): 175 ug via RESPIRATORY_TRACT
  Filled 2024-12-30 (×8): qty 3

## 2024-12-30 MED ORDER — BUDESONIDE 0.25 MG/2ML IN SUSP
0.2500 mg | Freq: Two times a day (BID) | RESPIRATORY_TRACT | Status: DC
  Administered 2024-12-30 – 2025-01-06 (×15): 0.25 mg via RESPIRATORY_TRACT
  Filled 2024-12-30 (×15): qty 2

## 2024-12-30 MED ORDER — ARFORMOTEROL TARTRATE 15 MCG/2ML IN NEBU
15.0000 ug | INHALATION_SOLUTION | Freq: Two times a day (BID) | RESPIRATORY_TRACT | Status: DC
  Administered 2024-12-30 – 2025-01-06 (×14): 15 ug via RESPIRATORY_TRACT
  Filled 2024-12-30 (×15): qty 2

## 2024-12-30 MED ORDER — ACETYLCYSTEINE 20 % IN SOLN
4.0000 mL | Freq: Three times a day (TID) | RESPIRATORY_TRACT | Status: DC
  Administered 2024-12-30 – 2025-01-01 (×7): 4 mL via RESPIRATORY_TRACT
  Filled 2024-12-30 (×8): qty 4

## 2024-12-30 MED ORDER — AZITHROMYCIN 250 MG PO TABS
500.0000 mg | ORAL_TABLET | Freq: Every day | ORAL | Status: DC
  Administered 2024-12-30 – 2025-01-04 (×6): 500 mg via ORAL
  Filled 2024-12-30 (×6): qty 2

## 2024-12-30 NOTE — Plan of Care (Signed)
  Problem: Coping: Goal: Ability to adjust to condition or change in health will improve Outcome: Progressing   Problem: Education: Goal: Knowledge of General Education information will improve Description: Including pain rating scale, medication(s)/side effects and non-pharmacologic comfort measures Outcome: Progressing   Problem: Clinical Measurements: Goal: Ability to maintain clinical measurements within normal limits will improve Outcome: Progressing

## 2024-12-30 NOTE — Progress Notes (Addendum)
 " [facility] Progress Note  Patient: [name] [name]     FMW:[id number]  DOA: 12/28/2024   PCP: Diona Perkins, MD       Brief hospital course: 79 y/o with copd, CKD, CLL with ongoing tobacco abuse presents for shortness of breath. Her husband was diagnosed with COVID last week and he was coughing and not covering his mouth and now she also has a cough and has been wheezing.  Subjective:  Still coughing quite a lot. Hypoxic and required a non rebreather mask per RN.   Assessment and Plan: Principal Problem:   COPD with acute exacerbation Acute hypoxic respiratory failure (HCC) - secondary to meta pneumovirus - cont Solumderol 40 mg BI & Nebs - add Mucomyst , Yupelri , Brovana , Pulmicort  nebs, Azithromycin  and chest PT today - will need to continue PRN BiPAP  Active Problems: Nicotine  abuse - smokes 5-10 cigarettes a day  AKI CKD 3b - cont to follow Cr  - baseline ~ 1.3- 1.38 now - admitted with Cr 1.60  Hyperkalemia - hold ARB and recheck tomorrow    CLL (chronic lymphocytic leukemia)  - WBC count in 60,000s - currently being managed conservatively w/o medications  DM - cont Novolog  SSI Hemoglobin A1C    Component Value Date/Time   HGBA1C 8.0 (H) 12/28/2024 0846   HGBA1C 7.6 (A) 08/15/2024 0938           Code Status: Full Code Total time on patient care: 35 min DVT prophylaxis:  heparin  injection 5,000 Units Start: 12/28/24 0845     Objective:   Vitals:   12/30/24 0751 12/30/24 0800 12/30/24 0814 12/30/24 0817  BP:  (!) 174/70    Pulse: (!) 128 85  76  Resp: (!) 38 19  (!) 21  Temp:   98.1 F (36.7 C)   TempSrc:   Oral   SpO2: (!) 86% 100%  100%  Weight:      Height:       Filed Weights   12/28/24 1723  Weight: 63.9 kg   Exam: General exam: Appears comfortable  HEENT: oral mucosa moist Respiratory system: b/l rhonchi and tachypnea Cardiovascular system: S1 & S2 heard  Gastrointestinal system: Abdomen soft, non-tender, nondistended.  Normal bowel sounds   Extremities: No cyanosis, clubbing or edema Psychiatry:  Mood & affect appropriate.      CBC: Recent Labs  Lab 12/28/24 0321 12/29/24 0234 12/30/24 0255  WBC 61.9* 62.3* 58.9*  NEUTROABS 16.7*  --   --   HGB 12.2 12.6 11.6*  HCT 41.1 40.7 38.7  MCV 94.3 94.2 94.9  PLT 127* 130* 134*   Basic Metabolic Panel: Recent Labs  Lab 12/28/24 0321 12/29/24 0234 12/30/24 0255  NA 143 145 142  K 4.6 4.7 5.4*  CL 105 106 104  CO2 28 27 28   GLUCOSE 150* 176* 219*  BUN 13 22 24*  CREATININE 1.60* 1.58* 1.38*  CALCIUM  9.5 9.4 9.4     Scheduled Meds:  acetylcysteine   4 mL Nebulization TID   amLODipine   10 mg Oral Daily   atorvastatin   80 mg Oral Daily   buPROPion   150 mg Oral Daily   Chlorhexidine  Gluconate Cloth  6 each Topical Q0600   clopidogrel   75 mg Oral Daily   DULoxetine   60 mg Oral Daily   gabapentin   600 mg Oral QHS   heparin   5,000 Units Subcutaneous Q8H   HYDROcodone  bit-homatropine  5 mL Oral QID   insulin  aspart  0-15 Units Subcutaneous TID WC  insulin  aspart  0-5 Units Subcutaneous QHS   ipratropium-albuterol   3 mL Nebulization QID   losartan   50 mg Oral QHS   methylPREDNISolone  (SOLU-MEDROL ) injection  40 mg Intravenous Q12H   montelukast   10 mg Oral QHS    Imaging and lab data personally reviewed   Author: Clarabel Marion  12/30/2024 9:31 AM  To contact [facility]>   Check the care team in [facility] and look for the attending/consulting [facility] provider listed  Log into [email or web address] and use [place]'s universal password   Go to> [facility]  and find provider  If you still have difficulty reaching the provider, please page the [facility] (Director on Call) for the Hospitalists listed on amion     "

## 2024-12-31 DIAGNOSIS — J441 Chronic obstructive pulmonary disease with (acute) exacerbation: Secondary | ICD-10-CM | POA: Diagnosis not present

## 2024-12-31 LAB — BASIC METABOLIC PANEL WITH GFR
Anion gap: 8 (ref 5–15)
BUN: 30 mg/dL — ABNORMAL HIGH (ref 8–23)
CO2: 29 mmol/L (ref 22–32)
Calcium: 9.2 mg/dL (ref 8.9–10.3)
Chloride: 106 mmol/L (ref 98–111)
Creatinine, Ser: 1.49 mg/dL — ABNORMAL HIGH (ref 0.44–1.00)
GFR, Estimated: 36 mL/min — ABNORMAL LOW
Glucose, Bld: 219 mg/dL — ABNORMAL HIGH (ref 70–99)
Potassium: 5.8 mmol/L — ABNORMAL HIGH (ref 3.5–5.1)
Sodium: 143 mmol/L (ref 135–145)

## 2024-12-31 LAB — GLUCOSE, CAPILLARY
Glucose-Capillary: 154 mg/dL — ABNORMAL HIGH (ref 70–99)
Glucose-Capillary: 200 mg/dL — ABNORMAL HIGH (ref 70–99)
Glucose-Capillary: 258 mg/dL — ABNORMAL HIGH (ref 70–99)
Glucose-Capillary: 94 mg/dL (ref 70–99)

## 2024-12-31 MED ORDER — MORPHINE SULFATE (PF) 2 MG/ML IV SOLN
2.0000 mg | INTRAVENOUS | Status: DC | PRN
  Administered 2024-12-31 – 2025-01-02 (×2): 2 mg via INTRAVENOUS
  Filled 2024-12-31 (×2): qty 1

## 2024-12-31 MED ORDER — METHYLPREDNISOLONE SODIUM SUCC 40 MG IJ SOLR
40.0000 mg | Freq: Every day | INTRAMUSCULAR | Status: DC
  Administered 2025-01-01: 40 mg via INTRAVENOUS
  Filled 2024-12-31: qty 1

## 2024-12-31 MED ORDER — SODIUM ZIRCONIUM CYCLOSILICATE 5 G PO PACK
5.0000 g | PACK | Freq: Two times a day (BID) | ORAL | Status: AC
  Administered 2024-12-31 (×2): 5 g via ORAL
  Filled 2024-12-31 (×2): qty 1

## 2024-12-31 NOTE — Plan of Care (Signed)
  Problem: Education: Goal: Knowledge of General Education information will improve Description: Including pain rating scale, medication(s)/side effects and non-pharmacologic comfort measures Outcome: Progressing   Problem: Clinical Measurements: Goal: Respiratory complications will improve Outcome: Progressing Goal: Cardiovascular complication will be avoided Outcome: Progressing   Problem: Nutrition: Goal: Adequate nutrition will be maintained Outcome: Progressing   Problem: Elimination: Goal: Will not experience complications related to urinary retention Outcome: Progressing   

## 2024-12-31 NOTE — Progress Notes (Signed)
 CPT held for 0000 and 0400 due to patient sleeping.

## 2024-12-31 NOTE — Progress Notes (Signed)
 Increased FIO2 to 100% 30L , patient throwing up after meals, currently SPO2 is 99% RR 18. Patient aware of not drinking fluids.

## 2024-12-31 NOTE — Progress Notes (Addendum)
 " [facility] Progress Note  Patient: [name] [name]     FMW:[id number]  DOA: 12/28/2024   PCP: Diona Perkins, MD       Brief hospital course: 79 y/o with copd, CKD, CLL with ongoing tobacco abuse presents for shortness of breath. Her husband was diagnosed with COVID last week and he was coughing and not covering his mouth and now she also has a cough and has been wheezing.  Subjective:  She states she is breathing better today.   Assessment and Plan: Principal Problem:   COPD with acute exacerbation Acute hypoxic respiratory failure (HCC) - secondary to meta pneumovirus - cont Solumderol 40 mg (reduce to daily) & Nebs - cont Mucomyst , Yupelri , Brovana , Pulmicort  nebs, Azithromycin  and chest PT   - will need to continue PRN BiPAP in SDU - start IS and start getting out of bed  Active Problems: Nicotine  abuse - smokes 5-10 cigarettes a day  AKI CKD 3b - baseline ~ 1.3-  - admitted with Cr 1.60  Hyperkalemia - holding ARB  - give Lokelma     CLL (chronic lymphocytic leukemia)  - WBC count in 60,000s - currently being managed conservatively w/o medications  DM - cont Novolog  SSI Hemoglobin A1C    Component Value Date/Time   HGBA1C 8.0 (H) 12/28/2024 0846   HGBA1C 7.6 (A) 08/15/2024 0938      Code Status: Full Code Total time on patient care: 35 min DVT prophylaxis:  heparin  injection 5,000 Units Start: 12/28/24 0845     Objective:   Vitals:   12/31/24 0902 12/31/24 0903 12/31/24 0952 12/31/24 1000  BP:   (!) 168/65 (!) 144/69  Pulse:    64  Resp:    16  Temp:      TempSrc:      SpO2: 98% 98%  98%  Weight:      Height:       Filed Weights   12/28/24 1723  Weight: 63.9 kg   Exam: General exam: Appears comfortable  HEENT: oral mucosa moist Respiratory system: b/l rhonchi - no tachypnea on my exam today Cardiovascular system: S1 & S2 heard  Gastrointestinal system: Abdomen soft, non-tender, nondistended. Normal bowel sounds   Extremities: No  cyanosis, clubbing or edema Psychiatry:  Mood & affect appropriate.      CBC: Recent Labs  Lab 12/28/24 0321 12/29/24 0234 12/30/24 0255  WBC 61.9* 62.3* 58.9*  NEUTROABS 16.7*  --   --   HGB 12.2 12.6 11.6*  HCT 41.1 40.7 38.7  MCV 94.3 94.2 94.9  PLT 127* 130* 134*   Basic Metabolic Panel: Recent Labs  Lab 12/28/24 0321 12/29/24 0234 12/30/24 0255 12/31/24 0252  NA 143 145 142 143  K 4.6 4.7 5.4* 5.8*  CL 105 106 104 106  CO2 28 27 28 29   GLUCOSE 150* 176* 219* 219*  BUN 13 22 24* 30*  CREATININE 1.60* 1.58* 1.38* 1.49*  CALCIUM  9.5 9.4 9.4 9.2     Scheduled Meds:  acetylcysteine   4 mL Nebulization TID   amLODipine   10 mg Oral Daily   arformoterol   15 mcg Nebulization BID   atorvastatin   80 mg Oral Daily   azithromycin   500 mg Oral Daily   budesonide  (PULMICORT ) nebulizer solution  0.25 mg Nebulization BID   buPROPion   150 mg Oral Daily   Chlorhexidine  Gluconate Cloth  6 each Topical Q0600   clopidogrel   75 mg Oral Daily   DULoxetine   60 mg Oral Daily   gabapentin   600 mg Oral QHS   heparin   5,000 Units Subcutaneous Q8H   HYDROcodone  bit-homatropine  5 mL Oral QID   insulin  aspart  0-15 Units Subcutaneous TID WC   insulin  aspart  0-5 Units Subcutaneous QHS   ipratropium-albuterol   3 mL Nebulization QID   methylPREDNISolone  (SOLU-MEDROL ) injection  40 mg Intravenous Q12H   montelukast   10 mg Oral QHS   revefenacin   175 mcg Nebulization Daily    Imaging and lab data personally reviewed   Author: Roylee Chaffin  12/31/2024 10:13 AM  To contact [facility]>   Check the care team in [facility] and look for the attending/consulting [facility] provider listed  Log into [email or web address] and use [place]'s universal password   Go to> [facility]  and find provider  If you still have difficulty reaching the provider, please page the [facility] (Director on Call) for the Hospitalists listed on amion     "

## 2025-01-01 DIAGNOSIS — J441 Chronic obstructive pulmonary disease with (acute) exacerbation: Secondary | ICD-10-CM | POA: Diagnosis not present

## 2025-01-01 LAB — BASIC METABOLIC PANEL WITH GFR
Anion gap: 7 (ref 5–15)
BUN: 24 mg/dL — ABNORMAL HIGH (ref 8–23)
CO2: 31 mmol/L (ref 22–32)
Calcium: 9 mg/dL (ref 8.9–10.3)
Chloride: 103 mmol/L (ref 98–111)
Creatinine, Ser: 1.45 mg/dL — ABNORMAL HIGH (ref 0.44–1.00)
GFR, Estimated: 37 mL/min — ABNORMAL LOW
Glucose, Bld: 154 mg/dL — ABNORMAL HIGH (ref 70–99)
Potassium: 4.4 mmol/L (ref 3.5–5.1)
Sodium: 141 mmol/L (ref 135–145)

## 2025-01-01 LAB — GLUCOSE, CAPILLARY
Glucose-Capillary: 138 mg/dL — ABNORMAL HIGH (ref 70–99)
Glucose-Capillary: 196 mg/dL — ABNORMAL HIGH (ref 70–99)
Glucose-Capillary: 286 mg/dL — ABNORMAL HIGH (ref 70–99)
Glucose-Capillary: 172 mg/dL — ABNORMAL HIGH (ref 70–99)

## 2025-01-01 MED ORDER — ALUM & MAG HYDROXIDE-SIMETH 200-200-20 MG/5ML PO SUSP
30.0000 mL | ORAL | Status: DC | PRN
  Administered 2025-01-01 – 2025-01-03 (×3): 30 mL via ORAL
  Filled 2025-01-01 (×3): qty 30

## 2025-01-01 MED ORDER — PREDNISONE 20 MG PO TABS
40.0000 mg | ORAL_TABLET | Freq: Every day | ORAL | Status: DC
  Administered 2025-01-02 – 2025-01-04 (×3): 40 mg via ORAL
  Filled 2025-01-01 (×3): qty 2

## 2025-01-01 NOTE — Progress Notes (Signed)
 " [facility] Progress Note  Patient: [name] [name]     FMW:[id number]  DOA: 12/28/2024   PCP: Diona Perkins, MD       Brief hospital course: 79 y/o with copd, CKD, CLL with ongoing tobacco abuse presents for shortness of breath. Her husband was diagnosed with COVID last week and he was coughing and not covering his mouth and now she also has a cough and has been wheezing. Required 30-40 L [place] and non rebreather at one point. Appears to be improved today. Follow in SDU today and consider transfer tomorrow if remains stable.   Subjective:   Vomited yesterday but currently no nausea. Coughing less and breathing is improving.   Assessment and Plan: Principal Problem:   COPD with acute exacerbation Acute hypoxic respiratory failure (HCC) - secondary to meta pneumovirus - change Solumedrol to Prednisone  tomorrow - cont, Yupelri , Brovana , Pulmicort  nebs, Azithromycin  and chest PT   - refusing Mucomyst - will dc -  PRN BiPAP in SDU - started IS and start getting out of bed - has been weaned to 6 L today  Active Problems: Vomiting  - yesterday afternoon - resolved - advance to regular diet today  Nicotine  abuse - smokes 5-10 cigarettes a day  AKI CKD 3b - baseline ~ 1.3-  - admitted with Cr 1.60  Hyperkalemia - holding ARB  - given Lokelma  with improvement from 5.8 to 4.4    CLL (chronic lymphocytic leukemia)  - WBC count in 60,000s - currently being managed conservatively w/o medications -  MRI for R renal mass ordered by oncology  DM - cont Novolog  SSI Hemoglobin A1C    Component Value Date/Time   HGBA1C 8.0 (H) 12/28/2024 0846   HGBA1C 7.6 (A) 08/15/2024 0938      Code Status: Full Code Total time on patient care: 35 min DVT prophylaxis:  heparin  injection 5,000 Units Start: 12/28/24 0845     Objective:   Vitals:   01/01/25 1300 01/01/25 1400 01/01/25 1441 01/01/25 1501  BP: (!) 132/55 (!) 160/58    Pulse:      Resp: 14 14 18    Temp:       TempSrc:      SpO2:   98% 97%  Weight:      Height:       Filed Weights   12/28/24 1723  Weight: 63.9 kg   Exam: General exam: Appears comfortable  HEENT: oral mucosa moist Respiratory system: coarse breath sounds today, no wheezing Cardiovascular system: S1 & S2 heard  Gastrointestinal system: Abdomen soft, non-tender, nondistended. Normal bowel sounds   Extremities: No cyanosis, clubbing or edema Psychiatry:  Mood & affect appropriate.      CBC: Recent Labs  Lab 12/28/24 0321 12/29/24 0234 12/30/24 0255  WBC 61.9* 62.3* 58.9*  NEUTROABS 16.7*  --   --   HGB 12.2 12.6 11.6*  HCT 41.1 40.7 38.7  MCV 94.3 94.2 94.9  PLT 127* 130* 134*   Basic Metabolic Panel: Recent Labs  Lab 12/28/24 0321 12/29/24 0234 12/30/24 0255 12/31/24 0252 01/01/25 0737  NA 143 145 142 143 141  K 4.6 4.7 5.4* 5.8* 4.4  CL 105 106 104 106 103  CO2 28 27 28 29 31   GLUCOSE 150* 176* 219* 219* 154*  BUN 13 22 24* 30* 24*  CREATININE 1.60* 1.58* 1.38* 1.49* 1.45*  CALCIUM  9.5 9.4 9.4 9.2 9.0     Scheduled Meds:  amLODipine   10 mg Oral Daily   arformoterol   15 mcg Nebulization BID   atorvastatin   80 mg Oral Daily   azithromycin   500 mg Oral Daily   budesonide  (PULMICORT ) nebulizer solution  0.25 mg Nebulization BID   buPROPion   150 mg Oral Daily   Chlorhexidine  Gluconate Cloth  6 each Topical Q0600   clopidogrel   75 mg Oral Daily   DULoxetine   60 mg Oral Daily   gabapentin   600 mg Oral QHS   heparin   5,000 Units Subcutaneous Q8H   HYDROcodone  bit-homatropine  5 mL Oral QID   insulin  aspart  0-15 Units Subcutaneous TID WC   insulin  aspart  0-5 Units Subcutaneous QHS   ipratropium-albuterol   3 mL Nebulization QID   methylPREDNISolone  (SOLU-MEDROL ) injection  40 mg Intravenous Daily   montelukast   10 mg Oral QHS   revefenacin   175 mcg Nebulization Daily    Imaging and lab data personally reviewed   Author: Evrett Hakim  01/01/2025 3:53 PM  To contact [facility]>    Check the care team in [facility] and look for the attending/consulting [facility] provider listed  Log into [email or web address] and use [place]'s universal password   Go to> [facility]  and find provider  If you still have difficulty reaching the provider, please page the [facility] (Director on Call) for the Hospitalists listed on amion     "

## 2025-01-01 NOTE — Progress Notes (Signed)
" °   01/01/25 0851  TOC Brief Assessment  Insurance and Status Reviewed  Patient has primary care physician Yes  Home environment has been reviewed Home with spouse  Prior level of function: Independent with ADL's  Prior/Current Home Services No current home services  Social Drivers of Health Review SDOH reviewed no interventions necessary  Readmission risk has been reviewed Yes  Transition of care needs transition of care needs identified, TOC will continue to follow   Pt screened. Pt needing HHFNC at this time, no O2 at baseline. No SDOH risks identified. ICM will follow for potential home O2 need.  "

## 2025-01-02 ENCOUNTER — Inpatient Hospital Stay (HOSPITAL_COMMUNITY)

## 2025-01-02 LAB — GLUCOSE, CAPILLARY
Glucose-Capillary: 147 mg/dL — ABNORMAL HIGH (ref 70–99)
Glucose-Capillary: 165 mg/dL — ABNORMAL HIGH (ref 70–99)
Glucose-Capillary: 170 mg/dL — ABNORMAL HIGH (ref 70–99)
Glucose-Capillary: 138 mg/dL — ABNORMAL HIGH (ref 70–99)

## 2025-01-02 MED ORDER — PANTOPRAZOLE SODIUM 40 MG PO TBEC
40.0000 mg | DELAYED_RELEASE_TABLET | Freq: Every day | ORAL | Status: DC
  Administered 2025-01-02 – 2025-01-06 (×5): 40 mg via ORAL
  Filled 2025-01-02 (×5): qty 1

## 2025-01-02 MED ORDER — BISACODYL 10 MG RE SUPP: 10.0000 mg | Freq: Every day | RECTAL | Status: DC | PRN

## 2025-01-02 MED ORDER — SENNOSIDES-DOCUSATE SODIUM 8.6-50 MG PO TABS
1.0000 | ORAL_TABLET | Freq: Two times a day (BID) | ORAL | Status: DC
  Administered 2025-01-02 – 2025-01-06 (×7): 1 via ORAL
  Filled 2025-01-02 (×8): qty 1

## 2025-01-02 MED ORDER — GADOBUTROL 1 MMOL/ML IV SOLN
6.0000 mL | Freq: Once | INTRAVENOUS | Status: AC | PRN
  Administered 2025-01-02: 6 mL via INTRAVENOUS

## 2025-01-02 MED ORDER — HYDROCODONE BIT-HOMATROP MBR 5-1.5 MG/5ML PO SOLN
5.0000 mL | Freq: Four times a day (QID) | ORAL | Status: DC
  Administered 2025-01-02 – 2025-01-06 (×15): 5 mL via ORAL
  Filled 2025-01-02 (×15): qty 5

## 2025-01-02 MED ORDER — POLYETHYLENE GLYCOL 3350 17 G PO PACK
17.0000 g | PACK | Freq: Every day | ORAL | Status: DC | PRN
  Administered 2025-01-03: 17 g via ORAL
  Filled 2025-01-02: qty 1

## 2025-01-02 NOTE — Progress Notes (Signed)
 Attempted to call report to [facility], nurse is busy, left number for call back.

## 2025-01-02 NOTE — Plan of Care (Signed)

## 2025-01-02 NOTE — Progress Notes (Signed)
 Called RN in ICU for report, no answer at this time.

## 2025-01-02 NOTE — Plan of Care (Signed)
" °  Problem: Metabolic: Goal: Ability to maintain appropriate glucose levels will improve Outcome: Progressing   Problem: Education: Goal: Knowledge of General Education information will improve Description: Including pain rating scale, medication(s)/side effects and non-pharmacologic comfort measures Outcome: Progressing   Problem: Nutrition: Goal: Adequate nutrition will be maintained Outcome: Not Progressing   "

## 2025-01-02 NOTE — Plan of Care (Signed)
   Problem: Education: Goal: Ability to describe self-care measures that may prevent or decrease complications (Diabetes Survival Skills Education) will improve Outcome: Progressing Goal: Individualized Educational Video(s) Outcome: Progressing

## 2025-01-02 NOTE — Progress Notes (Signed)
 " PROGRESS NOTE    [name] [name]  FMW:[id number] DOB: [birth year] DOA: 12/28/2024 PCP: Diona Perkins, MD    Brief Narrative:   [name] [name] is a 79 y.o. female with past medical history significant for CLL, COPD, CKD stage IIIb, DM2, HLD, tobacco use disorder who presented to [facility] ED on 12/28/2024 from home via EMS with generalized weakness, body aches, shortness of breath, cough and subjective fever.  Reports sick contacts, husband recently diagnosed with COVID this past week.  Denies chest pain.  On EMS arrival, patient's oxygen  saturation was in the 70s, not oxygen  dependent at baseline.  Was placed on 3 L nasal cannula, given bronchodilators and transported the ED for further evaluation management.  In the ED, temperature 98.3 F, HR 65, RR 14, BP 138/65, SpO2 98% on 30 L heated high flow nasal cannula.  WBC 61.9, hemoglobin 12.2, platelet count 127.  Sodium 143, potassium 4.6, chloride 105, CO2 28, glucose 150, BUN 13, creat 1.60.  AST 39, ALT 33, total Ruben 0.4.  BNP 77.6.  Hemoglobin A1c 8.0, COVID/influenza/RSV PCR negative.  Respiratory viral panel positive for rhinovirus.  Chest x-ray with no acute findings.  CT angiogram chest with no pulmonary embolism, emphysema with diffuse bronchial wall thickening suggestive of small airway disease, moderate central lobar emphysema, persistent mid to distal esophageal wall thickening, interval development intermediate bilateral axillary lymphadenopathy.  TRH consulted for admission for further evaluation management of acute on chronic COPD exacerbation likely secondary to rhinovirus viral infection.  Assessment & Plan:   Acute COPD exacerbation Acute hypoxic respiratory failure, POA Rhinovirus viral infection Patient presenting with 3-day history of progressive shortness of breath, cough, subjective fevers and bodyaches in the setting of known sick contact; spouse tested positive for COVID-19 recently.  On EMS arrival patient was noted to  be hypoxic with SpO2 in the 70s in which she is not oxygen  dependent at baseline.  COVID/influenza/RSV PCR negative, although respiratory viral panel positive for rhinovirus.  Initially required 3 L nasal, and on ED presentation uptitrated to 30 L heated high flow nasal cannula.  Controlled with Trelegy Ellipta  at baseline -- Azithromycin  500 mg p.o. daily -- Pulmicort  neb twice daily -- Brovana  neb twice daily -- Yulpelri Neb daily -- Prednisone  40 mg p.o. daily -- DuoNebs 4 times daily scheduled -- Montelukast  10 mg p.o. nightly -- Albuterol  neb every 2 hours as needed wheezing/shortness of breath -- Continue supplemental oxygen , maintain SpO2 > 88%, weaned down to 2 L nasal cannula this morning -- Incentive spirometry -- PT evaluation, ambulatory O2 screen today  CLL Follows with medical oncology outpatient, Dr. Onesimo.  Currently on surveillance.  WBC count elevated 61.9 on admission.  Peripheral smear notable for potential transformation. -- Medical oncology following, appreciate assistance -- WBC 61.9>62.3>58.9 (was 61.4 on 12/07/24), but 18.3 on 08/17/2024 -- CBC in am  Right renal mass CT abdomen/pelvis with a large central lesion upper pole right kidney measuring 2.6 x 1.6 cm. -- Medical oncology following as above -- MR abdomen/pelvis with and without contrast: Pending  CKD stage IIIb -- Cr 1.60>1.58>1.38>1.49, 1.45; stable (baseline 1.3 - 1.5)  DM2, with hyperglycemia Hemoglobin C 8.0 on 12/28/2024, not optimally controlled.  Home regimen includes Jardiance , metformin  1000 g p.o. every morning -- Moderate SSI for coverage -- CBG before every meal/at bedtime  HLD -- Atorvastatin  80 mg p.o. daily  GERD -- Continue Pepcid  20 mg p.o. nightly  Depression -- Cymbalta  60 mg p.o. daily -- Bupropion  100 g  p.o. daily  Neuropathy -- Continue gabapentin , Cymbalta   Tobacco use disorder Counseled on need for complete cessation/abstinence.  On bupropion    DVT prophylaxis:  heparin  injection 5,000 Units Start: 12/28/24 0845    Code Status: Full Code Family Communication: No family present at bedside this morning  Disposition Plan:  Level of care: Stepdown Status is: Inpatient Remains inpatient appropriate because: Weaning of oxygen , awaiting further recommendations from oncology, MR abdomen today    Consultants:  Medical oncology  Procedures:  None  Antimicrobials:  Azithromycin  1/4>>   Subjective: Patient seen examined bedside, lying in bed.  No specific complaints.  Oxygen  weaned down to 2-3 L per nasal cannula this morning.  Discussed with patient will have PT evaluation and ambulatory O2 screen today.  Solu-Medrol  transition to oral prednisone  today.  Continues with mild dyspnea.  No other specific complaints or concerns at this time.  Denies headache, no dizziness, no chest pain, no palpitations, no fever/chills/night sweats, no nausea/vomiting/diarrhea, no focal weakness, no fatigue, no paresthesias.  No acute events overnight per nursing staff.  Objective: Vitals:   01/02/25 0600 01/02/25 0700 01/02/25 0800 01/02/25 1000  BP: (!) 169/66 (!) 160/62 (!) 157/74 (!) 170/79  Pulse:      Resp: 17 17 14  (!) 22  Temp:   98.9 F (37.2 C)   TempSrc:   Oral   SpO2:   93%   Weight:      Height:        Intake/Output Summary (Last 24 hours) at 01/02/2025 1054 Last data filed at 01/02/2025 0000 Gross per 24 hour  Intake --  Output 1300 ml  Net -1300 ml   Filed Weights   12/28/24 1723  Weight: 63.9 kg    Examination:  Physical Exam: GEN: NAD, alert and oriented x 3, elderly in appearance HEENT: NCAT, PERRL, EOMI, sclera clear, MMM PULM: Breath sounds slight diminished bowel bases with mild late expiratory wheezing throughout all lung fields, normal respiratory effort without accessory muscle use, on 3 L nasal cannula with SpO2 93% at rest. CV: RRR  GI: abd soft, NTND, + BS MSK: no peripheral edema, moves all extremities independently with  preserved muscle strength NEURO: No focal neurological deficit PSYCH: normal mood/affect Integumentary: No concerning rashes/lesions/wounds noted on exposed skin surfaces    Data Reviewed: I have personally reviewed following labs and imaging studies  CBC: Recent Labs  Lab 12/28/24 0321 12/29/24 0234 12/30/24 0255  WBC 61.9* 62.3* 58.9*  NEUTROABS 16.7*  --   --   HGB 12.2 12.6 11.6*  HCT 41.1 40.7 38.7  MCV 94.3 94.2 94.9  PLT 127* 130* 134*   Basic Metabolic Panel: Recent Labs  Lab 12/28/24 0321 12/29/24 0234 12/30/24 0255 12/31/24 0252 01/01/25 0737  NA 143 145 142 143 141  K 4.6 4.7 5.4* 5.8* 4.4  CL 105 106 104 106 103  CO2 28 27 28 29 31   GLUCOSE 150* 176* 219* 219* 154*  BUN 13 22 24* 30* 24*  CREATININE 1.60* 1.58* 1.38* 1.49* 1.45*  CALCIUM  9.5 9.4 9.4 9.2 9.0   GFR: Estimated Creatinine Clearance: 28.5 mL/min (A) (by C-G formula based on SCr of 1.45 mg/dL (H)). Liver Function Tests: Recent Labs  Lab 12/28/24 0321  AST 39  ALT 33  ALKPHOS 243*  BILITOT 0.4  PROT 6.4*  ALBUMIN  4.0   No results for input(s): LIPASE, AMYLASE in the last 168 hours. No results for input(s): AMMONIA in the last 168 hours. Coagulation Profile: No results for  input(s): INR, PROTIME in the last 168 hours. Cardiac Enzymes: No results for input(s): CKTOTAL, CKMB, CKMBINDEX, TROPONINI in the last 168 hours. BNP (last 3 results) Recent Labs    12/28/24 0321  PROBNP 77.6   HbA1C: No results for input(s): HGBA1C in the last 72 hours. CBG: Recent Labs  Lab 01/01/25 0756 01/01/25 1219 01/01/25 1712 01/01/25 2142 01/02/25 0808  GLUCAP 138* 196* 286* 172* 147*   Lipid Profile: No results for input(s): CHOL, HDL, LDLCALC, TRIG, CHOLHDL, LDLDIRECT in the last 72 hours. Thyroid  Function Tests: No results for input(s): TSH, T4TOTAL, FREET4, T3FREE, THYROIDAB in the last 72 hours. Anemia Panel: No results for input(s):  VITAMINB12, FOLATE, FERRITIN, TIBC, IRON , RETICCTPCT in the last 72 hours. Sepsis Labs: No results for input(s): PROCALCITON, LATICACIDVEN in the last 168 hours.  Recent Results (from the past 240 hours)  Resp panel by RT-PCR (RSV, Flu A&B, Covid) Anterior Nasal Swab     Status: None   Collection Time: 12/28/24  3:10 AM   Specimen: Anterior Nasal Swab  Result Value Ref Range Status   SARS Coronavirus 2 by RT PCR NEGATIVE NEGATIVE Final    Comment: (NOTE) SARS-CoV-2 target nucleic acids are NOT DETECTED.  The SARS-CoV-2 RNA is generally detectable in upper respiratory specimens during the acute phase of infection. The lowest concentration of SARS-CoV-2 viral copies this assay can detect is 138 copies/mL. A negative result does not preclude SARS-Cov-2 infection and should not be used as the sole basis for treatment or other patient management decisions. A negative result may occur with  improper specimen collection/handling, submission of specimen other than nasopharyngeal swab, presence of viral mutation(s) within the areas targeted by this assay, and inadequate number of viral copies(<138 copies/mL). A negative result must be combined with clinical observations, patient history, and epidemiological information. The expected result is Negative.  Fact Sheet for Patients:  bloggercourse.com  Fact Sheet for Healthcare Providers:  seriousbroker.it  This test is no t yet approved or cleared by the [place]  FDA and  has been authorized for detection and/or diagnosis of SARS-CoV-2 by FDA under an Emergency Use Authorization (EUA). This EUA will remain  in effect (meaning this test can be used) for the duration of the COVID-19 declaration under Section 564(b)(1) of the Act, 21 U.S.C.section 360bbb-3(b)(1), unless the authorization is terminated  or revoked sooner.       Influenza A by PCR NEGATIVE NEGATIVE Final    Influenza B by PCR NEGATIVE NEGATIVE Final    Comment: (NOTE) The Xpert Xpress SARS-CoV-2/FLU/RSV plus assay is intended as an aid in the diagnosis of influenza from Nasopharyngeal swab specimens and should not be used as a sole basis for treatment. Nasal washings and aspirates are unacceptable for Xpert Xpress SARS-CoV-2/FLU/RSV testing.  Fact Sheet for Patients: bloggercourse.com  Fact Sheet for Healthcare Providers: seriousbroker.it  This test is not yet approved or cleared by the [place]  FDA and has been authorized for detection and/or diagnosis of SARS-CoV-2 by FDA under an Emergency Use Authorization (EUA). This EUA will remain in effect (meaning this test can be used) for the duration of the COVID-19 declaration under Section 564(b)(1) of the Act, 21 U.S.C. section 360bbb-3(b)(1), unless the authorization is terminated or revoked.     Resp Syncytial Virus by PCR NEGATIVE NEGATIVE Final    Comment: (NOTE) Fact Sheet for Patients: bloggercourse.com  Fact Sheet for Healthcare Providers: seriousbroker.it  This test is not yet approved or cleared by the [place]  FDA and has  been authorized for detection and/or diagnosis of SARS-CoV-2 by FDA under an Emergency Use Authorization (EUA). This EUA will remain in effect (meaning this test can be used) for the duration of the COVID-19 declaration under Section 564(b)(1) of the Act, 21 U.S.C. section 360bbb-3(b)(1), unless the authorization is terminated or revoked.  Performed at [facility], [address] [place], [place] [address]   Respiratory (~20 pathogens) panel by PCR     Status: Abnormal   Collection Time: 12/28/24  8:46 AM   Specimen: Nasopharyngeal Swab; Respiratory  Result Value Ref Range Status   Adenovirus NOT DETECTED NOT DETECTED Final   Coronavirus 229E NOT DETECTED NOT DETECTED  Final    Comment: (NOTE) The Coronavirus on the Respiratory Panel, DOES NOT test for the novel  Coronavirus (2019 nCoV)    Coronavirus HKU1 NOT DETECTED NOT DETECTED Final   Coronavirus NL63 NOT DETECTED NOT DETECTED Final   Coronavirus OC43 NOT DETECTED NOT DETECTED Final   Metapneumovirus NOT DETECTED NOT DETECTED Final   Rhinovirus / Enterovirus DETECTED (A) NOT DETECTED Final   Influenza A NOT DETECTED NOT DETECTED Final   Influenza B NOT DETECTED NOT DETECTED Final   Parainfluenza Virus 1 NOT DETECTED NOT DETECTED Final   Parainfluenza Virus 2 NOT DETECTED NOT DETECTED Final   Parainfluenza Virus 3 NOT DETECTED NOT DETECTED Final   Parainfluenza Virus 4 NOT DETECTED NOT DETECTED Final   Respiratory Syncytial Virus NOT DETECTED NOT DETECTED Final   Bordetella pertussis NOT DETECTED NOT DETECTED Final   Bordetella Parapertussis NOT DETECTED NOT DETECTED Final   Chlamydophila pneumoniae NOT DETECTED NOT DETECTED Final   Mycoplasma pneumoniae NOT DETECTED NOT DETECTED Final    Comment: Performed at [facility], [address], [place], [place] [address]  MRSA Next Gen by PCR, Nasal     Status: None   Collection Time: 12/28/24  5:40 PM   Specimen: Nasal Mucosa; Nasal Swab  Result Value Ref Range Status   MRSA by PCR Next Gen NOT DETECTED NOT DETECTED Final    Comment: (NOTE) The GeneXpert MRSA Assay (FDA approved for NASAL specimens only), is one component of a comprehensive MRSA colonization surveillance program. It is not intended to diagnose MRSA infection nor to guide or monitor treatment for MRSA infections. Test performance is not FDA approved in patients less than 40 years old. Performed at [facility], [address], [place], [place] [address]          Radiology Studies: No results found.      Scheduled Meds:  amLODipine   10 mg Oral Daily   arformoterol   15 mcg Nebulization BID   atorvastatin   80 mg Oral Daily   azithromycin    500 mg Oral Daily   budesonide  (PULMICORT ) nebulizer solution  0.25 mg Nebulization BID   buPROPion   150 mg Oral Daily   Chlorhexidine  Gluconate Cloth  6 each Topical Q0600   clopidogrel   75 mg Oral Daily   DULoxetine   60 mg Oral Daily   gabapentin   600 mg Oral QHS   heparin   5,000 Units Subcutaneous Q8H   HYDROcodone  bit-homatropine  5 mL Oral QID   insulin  aspart  0-15 Units Subcutaneous TID WC   insulin  aspart  0-5 Units Subcutaneous QHS   ipratropium-albuterol   3 mL Nebulization QID   montelukast   10 mg Oral QHS   predniSONE   40 mg Oral Q breakfast   revefenacin   175 mcg Nebulization Daily   Continuous Infusions:  LOS: 5 days    Time spent: 50 minutes spent on 01/02/2025 caring for this patient face-to-face including chart review, ordering labs/tests, documenting, discussion with nursing staff, consultants, updating family and interview/physical exam    [name] PARAS Chrishelle Zito, DO [facility] Available via Epic secure chat 7am-7pm After these hours, please refer to coverage provider listed on [email or web address] 01/02/2025, 10:54 AM   "

## 2025-01-03 DIAGNOSIS — C9110 Chronic lymphocytic leukemia of B-cell type not having achieved remission: Secondary | ICD-10-CM | POA: Diagnosis not present

## 2025-01-03 LAB — BASIC METABOLIC PANEL WITH GFR
Anion gap: 9 (ref 5–15)
BUN: 23 mg/dL (ref 8–23)
CO2: 29 mmol/L (ref 22–32)
Calcium: 9 mg/dL (ref 8.9–10.3)
Chloride: 100 mmol/L (ref 98–111)
Creatinine, Ser: 1.4 mg/dL — ABNORMAL HIGH (ref 0.44–1.00)
GFR, Estimated: 39 mL/min — ABNORMAL LOW
Glucose, Bld: 229 mg/dL — ABNORMAL HIGH (ref 70–99)
Potassium: 5.5 mmol/L — ABNORMAL HIGH (ref 3.5–5.1)
Sodium: 138 mmol/L (ref 135–145)

## 2025-01-03 LAB — GLUCOSE, CAPILLARY
Glucose-Capillary: 253 mg/dL — ABNORMAL HIGH (ref 70–99)
Glucose-Capillary: 95 mg/dL (ref 70–99)
Glucose-Capillary: 308 mg/dL — ABNORMAL HIGH (ref 70–99)
Glucose-Capillary: 129 mg/dL — ABNORMAL HIGH (ref 70–99)

## 2025-01-03 LAB — CBC WITH DIFFERENTIAL/PLATELET
Abs Immature Granulocytes: 1.01 K/uL — ABNORMAL HIGH (ref 0.00–0.07)
Basophils Absolute: 0.2 K/uL — ABNORMAL HIGH (ref 0.0–0.1)
Basophils Relative: 0 %
Eosinophils Absolute: 0.1 K/uL (ref 0.0–0.5)
Eosinophils Relative: 0 %
HCT: 41.6 % (ref 36.0–46.0)
Hemoglobin: 12.8 g/dL (ref 12.0–15.0)
Immature Granulocytes: 1 %
Lymphocytes Relative: 75 %
Lymphs Abs: 59 K/uL — ABNORMAL HIGH (ref 0.7–4.0)
MCH: 28.8 pg (ref 26.0–34.0)
MCHC: 30.8 g/dL (ref 30.0–36.0)
MCV: 93.7 fL (ref 80.0–100.0)
Monocytes Absolute: 3.1 K/uL — ABNORMAL HIGH (ref 0.1–1.0)
Monocytes Relative: 4 %
Neutro Abs: 15.5 K/uL — ABNORMAL HIGH (ref 1.7–7.7)
Neutrophils Relative %: 20 %
Platelets: 149 K/uL — ABNORMAL LOW (ref 150–400)
RBC: 4.44 MIL/uL (ref 3.87–5.11)
RDW: 18.4 % — ABNORMAL HIGH (ref 11.5–15.5)
Smear Review: NORMAL
WBC: 77.6 K/uL (ref 4.0–10.5)
nRBC: 0.1 % (ref 0.0–0.2)

## 2025-01-03 LAB — CBC
HCT: 41.5 % (ref 36.0–46.0)
Hemoglobin: 12.5 g/dL (ref 12.0–15.0)
MCH: 28.4 pg (ref 26.0–34.0)
MCHC: 30.1 g/dL (ref 30.0–36.0)
MCV: 94.3 fL (ref 80.0–100.0)
Platelets: 143 K/uL — ABNORMAL LOW (ref 150–400)
RBC: 4.4 MIL/uL (ref 3.87–5.11)
RDW: 18.3 % — ABNORMAL HIGH (ref 11.5–15.5)
WBC: 74.1 K/uL (ref 4.0–10.5)
nRBC: 0.1 % (ref 0.0–0.2)

## 2025-01-03 NOTE — Progress Notes (Addendum)
 [name] [name]   DOB:1946/11/02   FM#:[id number]      CLINICAL SUMMARY:  [name] [name] is a 79 year old female patient who presented to the ED 12/28/2024 with complaints of cough and shortness of breath. Oncologic history is significant for CLL. Medical oncology following.   INTERVAL HISTORY: - Patient reported that she has had 3 weeks of progressive shortness of breath with wheezing.  Admitted to spouse having COVID recently.  -Patient has history of CLL and was previously treated with Gazyva  + venetoclax .  She was scheduled for PET/CT scan however due to hospitalization, this test was unable to be performed.   ASSESSMENT & PLAN:  Acute hypoxic respiratory failure Acute exacerbation of COPD Rhinovirus infection - On O2 via nasal cannula - Continue respiratory treatments, cough medicines, antibiotics, and steroids as ordered. - Monitor respiratory status closely  History of CLL - WBC seems to be rising, 74.1 today. - Concern for Richter's transformation -Ordered flow cytometry and manual differential. - Medical oncology/Dr. Onesimo following closely  Renal masses - She was scheduled for PET/CT scan however due to hospitalization, unable to perform. - Therefore CT imaging ordered during this admission.  CT abdomen pelvis showed renal lesion on right kidney 2.6 x 1.6 which showed increased and suspicious for neoplasm. -MR abdomen done 01/02/2025 and showed 2 lesions right kidney concerning for renal neoplasm.  Multiple hyperenhancing foci throughout the liver. - Medical oncology/Dr. Onesimo will make further recommendations.  CKD  - Appears to be improving - Avoid nephrotoxic agents - Continue to monitor CMP   Thrombocytopenia -Mild.  Platelets 143K -Continue to monitor CBC with differential  Hypertension Hyperlipidemia Diabetes -Continue antihypertensives as ordered.  Monitor BP closely. - Continue Lipitor . - Continue insulin  per protocol.  Monitor blood glucose levels.       Code Status Full  Subjective:  Patient seen awake and alert sitting up in chair at bedside with O2 via nasal cannula.  Spouse at bedside.  Reports that her breathing is much better although admits to still having a little bit of chest tightness.  Otherwise states that she feels okay.    Objective:   Intake/Output Summary (Last 24 hours) at 01/03/2025 1003 Last data filed at 01/03/2025 0600 Gross per 24 hour  Intake 360 ml  Output 500 ml  Net -140 ml     PHYSICAL EXAMINATION: ECOG PERFORMANCE STATUS: 2 - Symptomatic, <50% confined to bed  Vitals:   01/03/25 0539 01/03/25 0856  BP: (!) 147/65   Pulse: 66   Resp: 16   Temp: 98.5 F (36.9 C)   SpO2: 93% 93%   Filed Weights   12/28/24 1723  Weight: 140 lb 14 oz (63.9 kg)    GENERAL: alert, no distress and comfortable + elderly appearing SKIN: skin color, texture, turgor are normal, no rashes or significant lesions EYES: normal, conjunctiva are pink and non-injected, sclera clear OROPHARYNX: no exudate, no erythema and lips, buccal mucosa, and tongue normal  NECK: supple, thyroid  normal size, non-tender, without nodularity LYMPH: no palpable lymphadenopathy in the cervical, axillary or inguinal LUNGS: +Diminished at bases to auscultation  HEART: regular rate & rhythm and no murmurs and no lower extremity edema ABDOMEN: abdomen soft, non-tender and normal bowel sounds MUSCULOSKELETAL: no cyanosis of digits and no clubbing  PSYCH: alert & oriented x 3 with fluent speech NEURO: no focal motor/sensory deficits   All questions were answered. The patient knows to call the clinic with any problems, questions or concerns.   I personally spent  a total of 40 minutes minutes in the care of the patient today including preparing to see the patient, getting/reviewing separately obtained history, performing a medically appropriate exam/evaluation, counseling and educating, placing orders, referring and communicating with other  health care professionals, documenting clinical information in the EHR, and communicating results.    Olam PARAS Rouson, NP 01/03/2025 10:03 AM    Labs Reviewed:  Lab Results  Component Value Date   WBC 74.1 (HH) 01/03/2025   HGB 12.5 01/03/2025   HCT 41.5 01/03/2025   MCV 94.3 01/03/2025   PLT 143 (L) 01/03/2025   Recent Labs    08/17/24 1457 12/07/24 1229 12/28/24 0321 12/29/24 0234 12/31/24 0252 01/01/25 0737 01/03/25 0459  NA 143 143 143   < > 143 141 138  K 4.5 3.9 4.6   < > 5.8* 4.4 5.5*  CL 108 104 105   < > 106 103 100  CO2 31 25 28    < > 29 31 29   GLUCOSE 142* 164* 150*   < > 219* 154* 229*  BUN 10 14 13    < > 30* 24* 23  CREATININE 1.29* 1.76* 1.60*   < > 1.49* 1.45* 1.40*  CALCIUM  8.6* 10.1 9.5   < > 9.2 9.0 9.0  GFRNONAA 43* 29* 33*   < > 36* 37* 39*  PROT 5.9* 6.6 6.4*  --   --   --   --   ALBUMIN  4.1 4.4 4.0  --   --   --   --   AST 25 37 39  --   --   --   --   ALT 20 16 33  --   --   --   --   ALKPHOS 152* 204* 243*  --   --   --   --   BILITOT 0.3 0.4 0.4  --   --   --   --    < > = values in this interval not displayed.    Studies Reviewed:   MR ABDOMEN W WO CONTRAST Result Date: 01/02/2025 CLINICAL DATA:  Right kidney lesion. EXAM: MRI ABDOMEN WITHOUT AND WITH CONTRAST TECHNIQUE: Multiplanar multisequence MR imaging of the abdomen was performed both before and after the administration of intravenous contrast. CONTRAST:  6mL GADAVIST  GADOBUTROL  1 MMOL/ML IV SOLN COMPARISON:  CT scan abdomen and pelvis from 12/29/2024. FINDINGS: Lower chest: Unremarkable MR appearance to the lung bases. No pleural effusion. No pericardial effusion. Normal heart size. Hepatobiliary: The liver is normal in size. Noncirrhotic configuration. No suspicious liver lesion. There are innumerable 5 mm or smaller sized arterial hyperenhancing foci throughout the liver which are not seen on T1 weighted, T2 weighted or diffusion-weighted images. These foci are also not seen on the rest of  the postcontrast sequences. In patients without known liver disease, these are referred to as flash lesion and favored benign likely due to arterial portal shunts, flash filling hemangioma, FNH, etc. No suspicious liver lesion. No intrahepatic or extrahepatic bile duct dilatation. No choledocholithiasis. Status post cholecystectomy. Pancreas: Normal T1 hyperintense signal intensity of the pancreas. No peripancreatic fat stranding. Main pancreatic duct is not dilated. There are 2 peripancreatic lymph nodes (series 6, images 16 and 17), which are nonspecific but favored benign/reactive. Spleen: Size within normal limits. There are several scattered cysts in the spleen. Adrenals/Urinary Tract: Unremarkable adrenal glands. No hydroureteronephrosis. There are multiple scattered subcentimeter sized simple cysts throughout bilateral kidneys. There are 2 adjacent heterogeneous, T2 hypointense areas in the  right kidney upper pole, posterolaterally, which corresponds to the lesion described on the recent CT scan abdomen and pelvis. The larger 1.4 x 1.7 cm lesion (series 13, image 39), is in the posterior aspect and superiorly located in comparison to the second lesion. The larger lesion exhibits multiple T1 hyperintense foci which are nonenhancing. However, the rest of the lesion exhibits enhancement on the postcontrast images. The smaller 1.3 x 1.4 cm lesion is anterior and inferior to the larger lesion. This lesion also exhibits heterogeneous enhancement on the postcontrast images. These both are concerning for renal neoplasm. None of the lesion reaches up to the sinus fat. Right renal vein is patent. No perirenal lymphadenopathy. Stomach/Bowel: There is a small sliding hiatal hernia. There is dilated and fluid-filled visualized lower thoracic esophagus, nonspecific but commonly seen with esophageal dysmotility or reflux disease. Visualized portions within the abdomen are unremarkable. No disproportionate dilation of bowel  loops. Vascular/Lymphatic: No pathologically enlarged lymph nodes identified. No abdominal aortic aneurysm demonstrated. No ascites. Other:  None. Musculoskeletal: No suspicious bone lesions identified. IMPRESSION: 1. There are 2 adjacent lesions in the right kidney upper pole, as described in detail above, which are concerning for renal neoplasm. 2. No metastatic disease identified within the abdomen. 3. Innumerable, 5 mm or smaller sized arterial hyperenhancing foci throughout the liver, as discussed above. 4. Multiple other nonacute observations, as described above. Electronically Signed   By: Ree Molt M.D.   On: 01/02/2025 12:34   CT ABDOMEN PELVIS W CONTRAST Result Date: 12/29/2024 EXAM: CT ABDOMEN AND PELVIS WITH CONTRAST 12/29/2024 01:33:51 PM TECHNIQUE: CT of the abdomen and pelvis was performed with the administration of 70 mL of iohexol  (OMNIPAQUE ) 300 MG/ML solution. Multiplanar reformatted images are provided for review. Automated exposure control, iterative reconstruction, and/or weight-based adjustment of the mA/kV was utilized to reduce the radiation dose to as low as reasonably achievable. COMPARISON: 02/18/2024 CLINICAL HISTORY: Metastatic disease evaluation FINDINGS: LOWER CHEST: No acute abnormality. LIVER: The liver is unremarkable. GALLBLADDER AND BILE DUCTS: Status post cholecystectomy. The common bile duct is at the upper limits of normal measuring 7 mm in maximum diameter. Unchanged when compared with the previous exam. SPLEEN: Again seen are several low-attenuation lesions within the spleen similar to previous exam. The largest is in the splenic hilar region measuring 1.1 cm, image 32/2. PANCREAS: Pancreas is normal in size and contour without focal lesion or ductal dilatation. ADRENAL GLANDS: Normal adrenal glands. KIDNEYS, URETERS AND BLADDER: Arising within the posterolateral cortex of the upper pole of the right kidney is a solid-appearing lesion which measures 2.6 x 1.6 cm,  image 11/11. On the previous examination this measured 2.1 x 1.1 cm. Bosniak class 2 cyst is noted within the lateral cortex of the interpolar right kidney, measuring 6 mm, image 15/11. Per consensus, no follow-up is needed for simple Bosniak type 1 and 2 renal cysts, unless the patient has a malignancy history or risk factors. No stones in the kidneys or ureters. No hydronephrosis. No perinephric or periureteral stranding. Urinary bladder is unremarkable. GI AND BOWEL: Stomach demonstrates no acute abnormality. Appendix is visualized and normal in caliber, without wall thickening, periappendiceal inflammation, or fluid. No bowel wall thickening, inflammation, or distention. There is no bowel obstruction. PERITONEUM AND RETROPERITONEUM: No free fluid or fluid collections. No free air. VASCULATURE: Aorta is normal in caliber. Extensive aortic atherosclerotic calcifications. LYMPH NODES: No lymphadenopathy. REPRODUCTIVE ORGANS: No acute abnormality. BONES AND SOFT TISSUES: No acute osseous abnormality. Left lateral thigh lipoma measures 5.8  x 3.6 cm. IMPRESSION: 1. Enlarging solid-appearing lesion in the posterolateral cortex of the upper pole of the right kidney, measuring 2.6 x 1.6 cm, increased from 2.1 x 1.1 cm on the previous examination, suspicious for neoplasm; recommend urology consultation and further characterization with renal protocol MRI or CT without and with contrast. 2. No evidence of metastatic disease in the abdomen or pelvis. Electronically signed by: Waddell Calk MD 12/29/2024 01:44 PM EST RP Workstation: [id number]   CT Angio Chest Pulmonary Embolism (PE) W or WO Contrast Result Date: 12/28/2024 EXAM: CTA CHEST 12/28/2024 10:20:00 AM TECHNIQUE: CTA of the chest was performed after the administration of intravenous contrast. Multiplanar reformatted images are provided for review. MIP images are provided for review. Automated exposure control, iterative reconstruction, and/or weight based  adjustment of the mA/kV was utilized to reduce the radiation dose to as low as reasonably achievable. 50 mL (iohexol  (OMNIPAQUE ) 350 MG/ML injection 50 mL IOHEXOL  350 MG/ML SOLN) was administered. COMPARISON: CT angiography chest 02/18/24 CLINICAL HISTORY: Pulmonary embolism (PE) suspected, high prob. FINDINGS: PULMONARY ARTERIES: Pulmonary arteries are adequately opacified for evaluation. No acute pulmonary embolus. Main pulmonary artery is normal in caliber. MEDIASTINUM: The heart and pericardium demonstrate no acute abnormality. At least 2-vessel coronary artery calcifications. The thoracic aorta is normal in caliber. Severe atherosclerotic plaque. LYMPH NODES: Interval development of enlarged left axillary lymphadenopathy measuring up to 1.3 cm and large right axillary lymphadenopathy measuring up to 1.5 cm. No mediastinal or hilar lymphadenopathy. LUNGS AND PLEURA: Moderate centrilobular emphysematous changes. Diffuse bronchial wall thickening. Expiratory phase respiration. No focal consolidation or pulmonary edema. No evidence of pleural effusion or pneumothorax. UPPER ABDOMEN: Status post cholecystectomy. Thyroid  unremarkable. Persistent mid to distal esophageal wall thickening with limited evaluation due to timing of contrast. SOFT TISSUES AND BONES: No acute bone or soft tissue abnormality. IMPRESSION: 1. No pulmonary embolus. 2. Emphysema with diffuse bronchial wall thickening suggestive of small airway disease. Moderate centrilobular emphysema; pulmonary emphysema is an independent risk factor for lung cancer, recommend consideration for evaluation for a low-dose CT lung cancer screening program. 3. Persistent mid to distal esophageal wall thickening with limited evaluation due to contrast timing. Correlate with signs and symptoms of esophagitis. Consider direct visualization for further evaluation. 4. Interval development of indeterminate bilateral axillary lymphadenopathy. Underlying mental  proliferative disorder not excluded. Recommend attention on follow-up. Electronically signed by: Morgane Naveau MD 12/28/2024 11:19 AM EST RP Workstation: [id number]   DG Chest Portable 1 View Result Date: 12/28/2024 EXAM: 1 VIEW(S) XRAY OF THE CHEST 12/28/2024 03:19:00 AM COMPARISON: 02/18/2024 CLINICAL HISTORY: SOB FINDINGS: LUNGS AND PLEURA: No focal pulmonary opacity. No pleural effusion. No pneumothorax. HEART AND MEDIASTINUM: No acute abnormality of the cardiac and mediastinal silhouettes. BONES AND SOFT TISSUES: No acute osseous abnormality. IMPRESSION: 1. No acute findings. Electronically signed by: Franky Stanford MD 12/28/2024 03:59 AM EST RP Workstation: [id number]    ADDENDUM  .Patient was Personally and independently interviewed, examined and relevant elements of the history of present illness were reviewed in details and an assessment and plan was created. All elements of the patient's history of present illness , assessment and plan were discussed in details with Lisa Rouson NP. The above documentation reflects our combined findings assessment and plan.   Patient with known h/o CLL with previous rx with Gazyva  + Venetoclax  now with CLL progression. She was awaiting outpatient PET/CT but admitted with Acute hypoxic respiratory failure due to exacerbation of COPD.. CTA chest -- bilateral axillary LNadenopathy CT abd no evidence  of LNadenopathy or significant splenomegaly. Concerning renal lesion in rt kidney. MRI kidney -concerning for renal neoplasm, no evidence of metastatic disease .    Latest Ref Rng & Units 01/04/2025    4:46 AM 01/03/2025    1:43 PM 01/03/2025    4:59 AM  CBC  WBC 4.0 - 10.5 K/uL 71.1  77.6  74.1   Hemoglobin 12.0 - 15.0 g/dL 88.9  87.1  87.4   Hematocrit 36.0 - 46.0 % 35.8  41.6  41.5   Platelets 150 - 400 K/uL 128  149  143     CLL with significantly elevated LDH . No extensive LNadenpopathy or hepatosplenomegaly. LDH could be from alternative source  pulmonary/renal No significant cytopenia. Pathologist review with some Porlyymphocytes -- did not given 10% Upto 10% proplymphocytes can be seen with CLL Plan -flow cytometry done to evaluate precentage and presence of significant prolymphocytes to r/o Prolymphocytic transformation..results pending. -planning to start patient on Zanubrutinib as outpatient unless any other concerns.  2. Concerning renal lesion for [facility] Plan -consult urology   Emaline Saran MD MS

## 2025-01-03 NOTE — Evaluation (Signed)
 Physical Therapy Evaluation Patient Details Name: [name] [name] MRN: [id number] DOB: 18-Jun-1946 Today's Date: 01/03/2025  History of Present Illness  Pt is a 78y.o. female admitted to the hospital with acute hypoxic respiratory failure due to acute COPD exacerbation. Medical history significant for, but not limited to chronic heart failure with preserved EF, CKD, CLL, ongoing tobacco abuse, COPD on room air, diabetes, hypertension, and hyperlipidemia.   Clinical Impression  Pt is a 78y.o. female with above HPI resulting in the deficits listed below (see PT Problem List). Pt reports MOD I with mobility and ADLs as able. Pt performed sit to stand transfers and ambulated ~13ft with CGA and use of RW. Pt reported feeling Woozy during ambulation and intermittent mild LOB episodes- assisted back to recliner and vitals assessed. VSS (see general gait details).  Pt will benefit from continued skilled PT to maximize functional mobility to increase independence. Recommend home with intermittent assist and HHPT.          If plan is discharge home, recommend the following: A little help with walking and/or transfers;A little help with bathing/dressing/bathroom;Help with stairs or ramp for entrance;Assist for transportation;Assistance with cooking/housework   Can travel by private vehicle        Equipment Recommendations None recommended by PT (pt owns rollator, but reports brakes are broken)  Recommendations for Other Services       Functional Status Assessment Patient has had a recent decline in their functional status and demonstrates the ability to make significant improvements in function in a reasonable and predictable amount of time.     Precautions / Restrictions Precautions Precautions: Fall Restrictions Weight Bearing Restrictions Per Provider Order: No      Mobility  Bed Mobility               General bed mobility comments: pt in recilner pre/post session     Transfers Overall transfer level: Needs assistance Equipment used: Rolling walker (2 wheels) Transfers: Sit to/from Stand Sit to Stand: Contact guard assist           General transfer comment: use of B UEs for power up from recliner. x2 attempts for successful stand.    Ambulation/Gait Ambulation/Gait assistance: Contact guard assist Gait Distance (Feet): 25 Feet Assistive device: Rolling walker (2 wheels) Gait Pattern/deviations: Step-through pattern, Drifts right/left, Decreased stride length Gait velocity: decr     General Gait Details: pt reports feeling woozy during ambulation, intermittent drifting noted and mild lateral LOB with RW wheels sligthly coming off ground. Assisted pt back to recliner to assess vitals. O2 94-95%, 76bpm and BP 151/70 (94). RN notified of pt performance. Pt reports improvement in symptoms with seated rest.  Stairs            Wheelchair Mobility     Tilt Bed    Modified Rankin (Stroke Patients Only)       Balance Overall balance assessment: Needs assistance Sitting-balance support: No upper extremity supported, Feet supported Sitting balance-Leahy Scale: Good     Standing balance support: Bilateral upper extremity supported, During functional activity, Reliant on assistive device for balance Standing balance-Leahy Scale: Poor Standing balance comment: reliant on external support                             Pertinent Vitals/Pain Pain Assessment Pain Assessment: 0-10 Pain Score: 8  Pain Location: mid chest Pain Descriptors / Indicators: Sore, Tender Pain Intervention(s): Limited activity within patient's tolerance, Monitored  during session    Home Living Family/patient expects to be discharged to:: Private residence Living Arrangements: Spouse/significant other Available Help at Discharge: Family Type of Home: House Home Access: Stairs to enter Entrance Stairs-Rails: None Entrance Stairs-Number of Steps:  3   Home Layout: One level Home Equipment: Rollator (4 wheels);Cane - single point;Shower seat;BSC/3in1 (shower seat too big for shower) Additional Comments: Reports rollator has broken brakes. No O2 at baseline. Grandaughter with her when she has to go grocery shopping.    Prior Function Prior Level of Function : Independent/Modified Independent             Mobility Comments: uses rollator or cane depending on how feeling. Denies falls. Spouse sometimes assist with stairs to enter home. Uses BSC when have to ADLs Comments: Sponge baths due to decreased supervision for shower transfers.  Tries to do as much as she can on her own states that spouse does not always like to assist (reports complaints when asked) so she does what she can and takes her time. Does some cooking when spouse does not and does shopping, drives.     Extremity/Trunk Assessment   Upper Extremity Assessment Upper Extremity Assessment: Overall WFL for tasks assessed    Lower Extremity Assessment Lower Extremity Assessment: Generalized weakness    Cervical / Trunk Assessment Cervical / Trunk Assessment: Normal  Communication   Communication Communication: No apparent difficulties    Cognition Arousal: Alert Behavior During Therapy: WFL for tasks assessed/performed   PT - Cognitive impairments: No apparent impairments                         Following commands: Intact       Cueing Cueing Techniques: Verbal cues     General Comments      Exercises     Assessment/Plan    PT Assessment Patient needs continued PT services  PT Problem List Decreased strength;Decreased activity tolerance;Decreased balance;Decreased mobility       PT Treatment Interventions DME instruction;Gait training;Stair training;Functional mobility training;Therapeutic activities;Therapeutic exercise;Balance training;Patient/family education    PT Goals (Current goals can be found in the Care Plan section)   Acute Rehab PT Goals Patient Stated Goal: feel better PT Goal Formulation: With patient/family Time For Goal Achievement: 01/17/25 Potential to Achieve Goals: Good    Frequency Min 3X/week     Co-evaluation               AM-PAC PT 6 Clicks Mobility  Outcome Measure Help needed turning from your back to your side while in a flat bed without using bedrails?: A Little Help needed moving from lying on your back to sitting on the side of a flat bed without using bedrails?: A Little Help needed moving to and from a bed to a chair (including a wheelchair)?: A Little Help needed standing up from a chair using your arms (e.g., wheelchair or bedside chair)?: A Little Help needed to walk in hospital room?: A Little Help needed climbing 3-5 steps with a railing? : A Lot 6 Click Score: 17    End of Session Equipment Utilized During Treatment: Gait belt;Oxygen  Activity Tolerance: Treatment limited secondary to medical complications (Comment) (pt reporting feeling woozy and dizzy) Patient left: in chair;with call bell/phone within reach;with family/visitor present;Other (comment) (spouse present in room) Nurse Communication: Mobility status;Other (comment) (pt symptoms during session) PT Visit Diagnosis: Unsteadiness on feet (R26.81);Difficulty in walking, not elsewhere classified (R26.2)    Time: 8868-8842 PT Time  Calculation (min) (ACUTE ONLY): 26 min   Charges:   PT Evaluation $PT Eval Low Complexity: 1 Low PT Treatments $Therapeutic Activity: 8-22 mins PT General Charges $$ ACUTE PT VISIT: 1 Visit        Tinnie BERRY PT, DPT  Acute Rehabilitation Services  Office [phone]  01/03/2025, 5:06 PM

## 2025-01-03 NOTE — Progress Notes (Addendum)
 " PROGRESS NOTE    GINNIE MARICH  FMW:[id number] DOB: 09-26-1946 DOA: 12/28/2024 PCP: Diona Perkins, MD    Brief Narrative:   [name] [name] is a 79 y.o. female with past medical history significant for CLL, COPD, CKD stage IIIb, DM2, HLD, tobacco use disorder who presented to [facility] ED on 12/28/2024 from home via EMS with generalized weakness, body aches, shortness of breath, cough and subjective fever.  Reports sick contacts, husband recently diagnosed with COVID this past week.  Denies chest pain.  On EMS arrival, patient's oxygen  saturation was in the 70s, not oxygen  dependent at baseline.  Was placed on 3 L nasal cannula, given bronchodilators and transported the ED for further evaluation management.  In the ED, temperature 98.3 F, HR 65, RR 14, BP 138/65, SpO2 98% on 30 L heated high flow nasal cannula.  WBC 61.9, hemoglobin 12.2, platelet count 127.  Sodium 143, potassium 4.6, chloride 105, CO2 28, glucose 150, BUN 13, creat 1.60.  AST 39, ALT 33, total Ruben 0.4.  BNP 77.6.  Hemoglobin A1c 8.0, COVID/influenza/RSV PCR negative.  Respiratory viral panel positive for rhinovirus.  Chest x-ray with no acute findings.  CT angiogram chest with no pulmonary embolism, emphysema with diffuse bronchial wall thickening suggestive of small airway disease, moderate central lobar emphysema, persistent mid to distal esophageal wall thickening, interval development intermediate bilateral axillary lymphadenopathy.  TRH consulted for admission for further evaluation management of acute on chronic COPD exacerbation likely secondary to rhinovirus viral infection.  Assessment & Plan:   Acute COPD exacerbation Acute hypoxic respiratory failure, POA Rhinovirus viral infection Patient presenting with 3-day history of progressive shortness of breath, cough, subjective fevers and bodyaches in the setting of known sick contact; spouse tested positive for COVID-19 recently.  On EMS arrival patient was noted to  be hypoxic with SpO2 in the 70s in which she is not oxygen  dependent at baseline.  COVID/influenza/RSV PCR negative, although respiratory viral panel positive for rhinovirus.  Initially required 3 L nasal, and on ED presentation uptitrated to 30 L heated high flow nasal cannula.  Controlled with Trelegy Ellipta  at baseline -- Azithromycin  500 mg p.o. daily -- Pulmicort  neb twice daily -- Brovana  neb twice daily -- Yulpelri Neb daily -- Prednisone  40 mg p.o. daily -- DuoNebs 4 times daily scheduled -- Montelukast  10 mg p.o. 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 Onesimo.  Currently on surveillance.  WBC count elevated 61.9 on admission.  Peripheral smear notable for potential transformation. -- Medical oncology following, appreciate assistance -- WBC 61.9>62.3>58.9>74.1 (was 61.4 on 12/07/24), but 18.3 on 08/17/2024 -- CBC in am  Right renal mass concerning for neoplasm CT abdomen/pelvis with a large central lesion upper pole right kidney measuring 2.6 x 1.6 cm.  MR abdomen/pelvis with and without contrast with 2 lesions right kidney upper pole concerning for renal neoplasm, no metastatic disease within the abdomen, innumerable 5 mm or smaller size arterial enhancing foci through the liver. -- Medical oncology following as above -- Flow cytometry ordered and pending -- Will need outpatient follow-up with urology  CKD stage IIIb -- Cr 1.60>1.58>1.38>1.49, 1.45>1.40; stable (baseline 1.3 - 1.5)  DM2, with hyperglycemia Hemoglobin A1C 8.0 on 12/28/2024, not optimally controlled.  Home regimen  includes Jardiance , metformin  1000 g p.o. every morning -- Moderate SSI for coverage -- CBG before every meal/at  bedtime  HLD -- Atorvastatin  80 mg p.o. daily  GERD -- Continue Pepcid  20 mg p.o. nightly  Depression -- Cymbalta  60 mg p.o. daily -- Bupropion  100 g p.o. daily  Neuropathy -- Continue gabapentin , Cymbalta   Tobacco use disorder Counseled on need for complete cessation/abstinence.  On bupropion    DVT prophylaxis: heparin  injection 5,000 Units Start: 12/28/24 0845    Code Status: Full Code Family Communication: No family present at bedside this morning  Disposition Plan:  Level of care: Med-Surg Status is: Inpatient Remains inpatient appropriate because: Weaning of oxygen , awaiting further recommendations from oncology, MR abdomen today    Consultants:  Medical oncology  Procedures:  None  Antimicrobials:  Azithromycin  1/4>>   Subjective: Patient seen examined bedside, lying in bed.  No specific complaints.  Oxygen  weaned down to 3 L per nasal cannula.  Await PT evaluation and ambulatory O2 screen.  Reports improved dyspnea.  Messaged oncology regarding concerns for CLL transformation given uptrending WBC count and peripheral smear results by pathology at time of admission.  Patient with no other specific complaints or concerns at this time.  Denies headache, no dizziness, no chest pain, no palpitations, no fever/chills/night sweats, no nausea/vomiting/diarrhea, no focal weakness, no fatigue, no paresthesias.  No acute events overnight per nursing staff.  Objective: Vitals:   01/02/25 1621 01/02/25 2000 01/03/25 0539 01/03/25 0856  BP: 136/66 (!) 151/84 (!) 147/65   Pulse: 65 82 66   Resp: 16 16 16    Temp: 98.7 F (37.1 C) (!) 97.4 F (36.3 C) 98.5 F (36.9 C)   TempSrc:   Oral   SpO2: 95% 95% 93% 93%  Weight:      Height:        Intake/Output Summary (Last 24 hours) at 01/03/2025 1228 Last data filed at 01/03/2025 0600 Gross per 24 hour  Intake 360 ml  Output 500 ml  Net -140 ml   Filed Weights   12/28/24 1723  Weight: 63.9 kg     Examination:  Physical Exam: GEN: NAD, alert and oriented x 3, elderly in appearance HEENT: NCAT, PERRL, EOMI, sclera clear, MMM PULM: Breath sounds slight diminished bowel bases with mild late expiratory wheezing throughout all lung fields, normal respiratory effort without accessory muscle use, on 3 L nasal cannula with SpO2 93% at rest. CV: RRR  GI: abd soft, NTND, + BS MSK: no peripheral edema, moves all extremities independently with preserved muscle strength NEURO: No focal neurological deficit PSYCH: normal mood/affect Integumentary: No concerning rashes/lesions/wounds noted on exposed skin surfaces    Data Reviewed: I have personally reviewed following labs and imaging studies  CBC: Recent Labs  Lab 12/28/24 0321 12/29/24 0234 12/30/24 0255 01/03/25 0459  WBC 61.9* 62.3* 58.9* 74.1*  NEUTROABS 16.7*  --   --   --   HGB 12.2 12.6 11.6* 12.5  HCT 41.1 40.7 38.7 41.5  MCV 94.3 94.2 94.9 94.3  PLT 127* 130* 134* 143*   Basic Metabolic Panel: Recent Labs  Lab 12/29/24 0234 12/30/24 0255 12/31/24 0252 01/01/25 0737 01/03/25 0459  NA 145 142 143 141 138  K 4.7 5.4* 5.8* 4.4 5.5*  CL 106 104 106 103 100  CO2 27 28 29 31 29   GLUCOSE 176* 219* 219* 154* 229*  BUN 22 24* 30* 24* 23  CREATININE 1.58* 1.38* 1.49* 1.45* 1.40*  CALCIUM  9.4 9.4 9.2 9.0 9.0  GFR: Estimated Creatinine Clearance: 29.5 mL/min (A) (by C-G formula based on SCr of 1.4 mg/dL (H)). Liver Function Tests: Recent Labs  Lab 12/28/24 0321  AST 39  ALT 33  ALKPHOS 243*  BILITOT 0.4  PROT 6.4*  ALBUMIN  4.0   No results for input(s): LIPASE, AMYLASE in the last 168 hours. No results for input(s): AMMONIA in the last 168 hours. Coagulation Profile: No results for input(s): INR, PROTIME in the last 168 hours. Cardiac Enzymes: No results for input(s): CKTOTAL, CKMB, CKMBINDEX, TROPONINI in the last 168 hours. BNP (last 3 results) Recent Labs    12/28/24 0321  PROBNP  77.6   HbA1C: No results for input(s): HGBA1C in the last 72 hours. CBG: Recent Labs  Lab 01/02/25 1130 01/02/25 1619 01/02/25 2143 01/03/25 0741 01/03/25 1208  GLUCAP 165* 170* 138* 253* 95   Lipid Profile: No results for input(s): CHOL, HDL, LDLCALC, TRIG, CHOLHDL, LDLDIRECT in the last 72 hours. Thyroid  Function Tests: No results for input(s): TSH, T4TOTAL, FREET4, T3FREE, THYROIDAB in the last 72 hours. Anemia Panel: No results for input(s): VITAMINB12, FOLATE, FERRITIN, TIBC, IRON , RETICCTPCT in the last 72 hours. Sepsis Labs: No results for input(s): PROCALCITON, LATICACIDVEN in the last 168 hours.  Recent Results (from the past 240 hours)  Resp panel by RT-PCR (RSV, Flu A&B, Covid) Anterior Nasal Swab     Status: None   Collection Time: 12/28/24  3:10 AM   Specimen: Anterior Nasal Swab  Result Value Ref Range Status   SARS Coronavirus 2 by RT PCR NEGATIVE NEGATIVE Final    Comment: (NOTE) SARS-CoV-2 target nucleic acids are NOT DETECTED.  The SARS-CoV-2 RNA is generally detectable in upper respiratory specimens during the acute phase of infection. The lowest concentration of SARS-CoV-2 viral copies this assay can detect is 138 copies/mL. A negative result does not preclude SARS-Cov-2 infection and should not be used as the sole basis for treatment or other patient management decisions. A negative result may occur with  improper specimen collection/handling, submission of specimen other than nasopharyngeal swab, presence of viral mutation(s) within the areas targeted by this assay, and inadequate number of viral copies(<138 copies/mL). A negative result must be combined with clinical observations, patient history, and epidemiological information. The expected result is Negative.  Fact Sheet for Patients:  bloggercourse.com  Fact Sheet for Healthcare Providers:   seriousbroker.it  This test is no t yet approved or cleared by the [place]  FDA and  has been authorized for detection and/or diagnosis of SARS-CoV-2 by FDA under an Emergency Use Authorization (EUA). This EUA will remain  in effect (meaning this test can be used) for the duration of the COVID-19 declaration under Section 564(b)(1) of the Act, 21 U.S.C.section 360bbb-3(b)(1), unless the authorization is terminated  or revoked sooner.       Influenza A by PCR NEGATIVE NEGATIVE Final   Influenza B by PCR NEGATIVE NEGATIVE Final    Comment: (NOTE) The Xpert Xpress SARS-CoV-2/FLU/RSV plus assay is intended as an aid in the diagnosis of influenza from Nasopharyngeal swab specimens and should not be used as a sole basis for treatment. Nasal washings and aspirates are unacceptable for Xpert Xpress SARS-CoV-2/FLU/RSV testing.  Fact Sheet for Patients: bloggercourse.com  Fact Sheet for Healthcare Providers: seriousbroker.it  This test is not yet approved or cleared by the [place]  FDA and has been authorized for detection and/or diagnosis of SARS-CoV-2 by FDA under an Emergency Use Authorization (EUA). This EUA will remain in effect (meaning this test  can be used) for the duration of the COVID-19 declaration under Section 564(b)(1) of the Act, 21 U.S.C. section 360bbb-3(b)(1), unless the authorization is terminated or revoked.     Resp Syncytial Virus by PCR NEGATIVE NEGATIVE Final    Comment: (NOTE) Fact Sheet for Patients: bloggercourse.com  Fact Sheet for Healthcare Providers: seriousbroker.it  This test is not yet approved or cleared by the [place]  FDA and has been authorized for detection and/or diagnosis of SARS-CoV-2 by FDA under an Emergency Use Authorization (EUA). This EUA will remain in effect (meaning this test can be used) for  the duration of the COVID-19 declaration under Section 564(b)(1) of the Act, 21 U.S.C. section 360bbb-3(b)(1), unless the authorization is terminated or revoked.  Performed at [facility], [address], [place], [place] [address]   Respiratory (~20 pathogens) panel by PCR     Status: Abnormal   Collection Time: 12/28/24  8:46 AM   Specimen: Nasopharyngeal Swab; Respiratory  Result Value Ref Range Status   Adenovirus NOT DETECTED NOT DETECTED Final   Coronavirus 229E NOT DETECTED NOT DETECTED Final    Comment: (NOTE) The Coronavirus on the Respiratory Panel, DOES NOT test for the novel  Coronavirus (2019 nCoV)    Coronavirus HKU1 NOT DETECTED NOT DETECTED Final   Coronavirus NL63 NOT DETECTED NOT DETECTED Final   Coronavirus OC43 NOT DETECTED NOT DETECTED Final   Metapneumovirus NOT DETECTED NOT DETECTED Final   Rhinovirus / Enterovirus DETECTED (A) NOT DETECTED Final   Influenza A NOT DETECTED NOT DETECTED Final   Influenza B NOT DETECTED NOT DETECTED Final   Parainfluenza Virus 1 NOT DETECTED NOT DETECTED Final   Parainfluenza Virus 2 NOT DETECTED NOT DETECTED Final   Parainfluenza Virus 3 NOT DETECTED NOT DETECTED Final   Parainfluenza Virus 4 NOT DETECTED NOT DETECTED Final   Respiratory Syncytial Virus NOT DETECTED NOT DETECTED Final   Bordetella pertussis NOT DETECTED NOT DETECTED Final   Bordetella Parapertussis NOT DETECTED NOT DETECTED Final   Chlamydophila pneumoniae NOT DETECTED NOT DETECTED Final   Mycoplasma pneumoniae NOT DETECTED NOT DETECTED Final    Comment: Performed at [facility], [address] [place], [place] [address]  MRSA Next Gen by PCR, Nasal     Status: None   Collection Time: 12/28/24  5:40 PM   Specimen: Nasal Mucosa; Nasal Swab  Result Value Ref Range Status   MRSA by PCR Next Gen NOT DETECTED NOT DETECTED Final    Comment: (NOTE) The GeneXpert MRSA Assay (FDA approved for NASAL specimens only), is one component of  a comprehensive MRSA colonization surveillance program. It is not intended to diagnose MRSA infection nor to guide or monitor treatment for MRSA infections. Test performance is not FDA approved in patients less than 16 years old. Performed at [facility], [address], [place], [place] [address]          Radiology Studies: MR ABDOMEN W WO CONTRAST Result Date: 01/02/2025 CLINICAL DATA:  Right kidney lesion. EXAM: MRI ABDOMEN WITHOUT AND WITH CONTRAST TECHNIQUE: Multiplanar multisequence MR imaging of the abdomen was performed both before and after the administration of intravenous contrast. CONTRAST:  6mL GADAVIST  GADOBUTROL  1 MMOL/ML IV SOLN COMPARISON:  CT scan abdomen and pelvis from 12/29/2024. FINDINGS: Lower chest: Unremarkable MR appearance to the lung bases. No pleural effusion. No pericardial effusion. Normal heart size. Hepatobiliary: The liver is normal in size. Noncirrhotic configuration. No suspicious liver lesion. There are innumerable 5 mm or  smaller sized arterial hyperenhancing foci throughout the liver which are not seen on T1 weighted, T2 weighted or diffusion-weighted images. These foci are also not seen on the rest of the postcontrast sequences. In patients without known liver disease, these are referred to as flash lesion and favored benign likely due to arterial portal shunts, flash filling hemangioma, FNH, etc. No suspicious liver lesion. No intrahepatic or extrahepatic bile duct dilatation. No choledocholithiasis. Status post cholecystectomy. Pancreas: Normal T1 hyperintense signal intensity of the pancreas. No peripancreatic fat stranding. Main pancreatic duct is not dilated. There are 2 peripancreatic lymph nodes (series 6, images 16 and 17), which are nonspecific but favored benign/reactive. Spleen: Size within normal limits. There are several scattered cysts in the spleen. Adrenals/Urinary Tract: Unremarkable adrenal glands. No hydroureteronephrosis.  There are multiple scattered subcentimeter sized simple cysts throughout bilateral kidneys. There are 2 adjacent heterogeneous, T2 hypointense areas in the right kidney upper pole, posterolaterally, which corresponds to the lesion described on the recent CT scan abdomen and pelvis. The larger 1.4 x 1.7 cm lesion (series 13, image 39), is in the posterior aspect and superiorly located in comparison to the second lesion. The larger lesion exhibits multiple T1 hyperintense foci which are nonenhancing. However, the rest of the lesion exhibits enhancement on the postcontrast images. The smaller 1.3 x 1.4 cm lesion is anterior and inferior to the larger lesion. This lesion also exhibits heterogeneous enhancement on the postcontrast images. These both are concerning for renal neoplasm. None of the lesion reaches up to the sinus fat. Right renal vein is patent. No perirenal lymphadenopathy. Stomach/Bowel: There is a small sliding hiatal hernia. There is dilated and fluid-filled visualized lower thoracic esophagus, nonspecific but commonly seen with esophageal dysmotility or reflux disease. Visualized portions within the abdomen are unremarkable. No disproportionate dilation of bowel loops. Vascular/Lymphatic: No pathologically enlarged lymph nodes identified. No abdominal aortic aneurysm demonstrated. No ascites. Other:  None. Musculoskeletal: No suspicious bone lesions identified. IMPRESSION: 1. There are 2 adjacent lesions in the right kidney upper pole, as described in detail above, which are concerning for renal neoplasm. 2. No metastatic disease identified within the abdomen. 3. Innumerable, 5 mm or smaller sized arterial hyperenhancing foci throughout the liver, as discussed above. 4. Multiple other nonacute observations, as described above. Electronically Signed   By: Ree Molt M.D.   On: 01/02/2025 12:34        Scheduled Meds:  amLODipine   10 mg Oral Daily   arformoterol   15 mcg Nebulization BID    atorvastatin   80 mg Oral Daily   azithromycin   500 mg Oral Daily   budesonide  (PULMICORT ) nebulizer solution  0.25 mg Nebulization BID   buPROPion   150 mg Oral Daily   Chlorhexidine  Gluconate Cloth  6 each Topical Q0600   clopidogrel   75 mg Oral Daily   DULoxetine   60 mg Oral Daily   gabapentin   600 mg Oral QHS   heparin   5,000 Units Subcutaneous Q8H   HYDROcodone  bit-homatropine  5 mL Oral QID   insulin  aspart  0-15 Units Subcutaneous TID WC   insulin  aspart  0-5 Units Subcutaneous QHS   ipratropium-albuterol   3 mL Nebulization QID   montelukast   10 mg Oral QHS   pantoprazole   40 mg Oral Daily   predniSONE   40 mg Oral Q breakfast   revefenacin   175 mcg Nebulization Daily   senna-docusate  1 tablet Oral BID   Continuous Infusions:   LOS: 6 days    Time spent: 50 minutes  spent on 01/03/2025 caring for this patient face-to-face including chart review, ordering labs/tests, documenting, discussion with nursing staff, consultants, updating family and interview/physical exam    Camellia PARAS Standley Bargo, DO [facility] Available via Epic secure chat 7am-7pm After these hours, please refer to coverage provider listed on [email or web address] 01/03/2025, 12:28 PM   "

## 2025-01-03 NOTE — Plan of Care (Signed)
   Problem: Education: Goal: Ability to describe self-care measures that may prevent or decrease complications (Diabetes Survival Skills Education) will improve Outcome: Progressing Goal: Individualized Educational Video(s) Outcome: Progressing

## 2025-01-03 NOTE — Plan of Care (Signed)
" °  Problem: Education: Goal: Ability to describe self-care measures that may prevent or decrease complications (Diabetes Survival Skills Education) will improve 01/03/2025 0920 by Reesa Burnard HERO, RN Outcome: Progressing 01/03/2025 0920 by Reesa Burnard HERO, RN Outcome: Progressing Goal: Individualized Educational Video(s) 01/03/2025 0920 by Reesa Burnard HERO, RN Outcome: Progressing 01/03/2025 0920 by Reesa Burnard HERO, RN Outcome: Progressing   Problem: Coping: Goal: Ability to adjust to condition or change in health will improve 01/03/2025 0920 by Reesa Burnard HERO, RN Outcome: Progressing 01/03/2025 0920 by Reesa Burnard HERO, RN Outcome: Progressing   Problem: Fluid Volume: Goal: Ability to maintain a balanced intake and output will improve 01/03/2025 0920 by Reesa Burnard HERO, RN Outcome: Progressing 01/03/2025 0920 by Reesa Burnard HERO, RN Outcome: Progressing   Problem: Health Behavior/Discharge Planning: Goal: Ability to identify and utilize available resources and services will improve 01/03/2025 0920 by Reesa Burnard HERO, RN Outcome: Progressing 01/03/2025 0920 by Reesa Burnard HERO, RN Outcome: Progressing Goal: Ability to manage health-related needs will improve 01/03/2025 0920 by Reesa Burnard HERO, RN Outcome: Progressing 01/03/2025 0920 by Reesa Burnard HERO, RN Outcome: Progressing   Problem: Metabolic: Goal: Ability to maintain appropriate glucose levels will improve 01/03/2025 0920 by Reesa Burnard HERO, RN Outcome: Progressing 01/03/2025 0920 by Reesa Burnard HERO, RN Outcome: Progressing   Problem: Nutritional: Goal: Maintenance of adequate nutrition will improve 01/03/2025 0920 by Reesa Burnard HERO, RN Outcome: Progressing 01/03/2025 0920 by Reesa Burnard HERO, RN Outcome: Progressing Goal: Progress toward achieving an optimal weight will improve 01/03/2025 0920 by Reesa Burnard HERO, RN Outcome:  Progressing 01/03/2025 0920 by Reesa Burnard HERO, RN Outcome: Progressing   Problem: Skin Integrity: Goal: Risk for impaired skin integrity will decrease 01/03/2025 0920 by Reesa Burnard HERO, RN Outcome: Progressing 01/03/2025 0920 by Reesa Burnard HERO, RN Outcome: Progressing   Problem: Tissue Perfusion: Goal: Adequacy of tissue perfusion will improve 01/03/2025 0920 by Reesa Burnard HERO, RN Outcome: Progressing 01/03/2025 0920 by Reesa Burnard HERO, RN Outcome: Progressing   Problem: Education: Goal: Knowledge of General Education information will improve Description: Including pain rating scale, medication(s)/side effects and non-pharmacologic comfort measures 01/03/2025 0920 by Reesa Burnard HERO, RN Outcome: Progressing 01/03/2025 0920 by Reesa Burnard HERO, RN Outcome: Progressing   Problem: Health Behavior/Discharge Planning: Goal: Ability to manage health-related needs will improve Outcome: Progressing   Problem: Clinical Measurements: Goal: Ability to maintain clinical measurements within normal limits will improve Outcome: Progressing Goal: Will remain free from infection Outcome: Progressing Goal: Diagnostic test results will improve Outcome: Progressing Goal: Respiratory complications will improve Outcome: Progressing Goal: Cardiovascular complication will be avoided Outcome: Progressing   Problem: Activity: Goal: Risk for activity intolerance will decrease Outcome: Progressing   Problem: Nutrition: Goal: Adequate nutrition will be maintained Outcome: Progressing   Problem: Coping: Goal: Level of anxiety will decrease Outcome: Progressing   Problem: Elimination: Goal: Will not experience complications related to bowel motility Outcome: Progressing Goal: Will not experience complications related to urinary retention Outcome: Progressing   Problem: Pain Managment: Goal: General experience of comfort will improve and/or be  controlled Outcome: Progressing   Problem: Safety: Goal: Ability to remain free from injury will improve Outcome: Progressing   Problem: Skin Integrity: Goal: Risk for impaired skin integrity will decrease Outcome: Progressing   "

## 2025-01-04 DIAGNOSIS — J441 Chronic obstructive pulmonary disease with (acute) exacerbation: Secondary | ICD-10-CM | POA: Diagnosis not present

## 2025-01-04 LAB — BASIC METABOLIC PANEL WITH GFR
Anion gap: 7 (ref 5–15)
BUN: 26 mg/dL — ABNORMAL HIGH (ref 8–23)
CO2: 31 mmol/L (ref 22–32)
Calcium: 9.3 mg/dL (ref 8.9–10.3)
Chloride: 103 mmol/L (ref 98–111)
Creatinine, Ser: 1.75 mg/dL — ABNORMAL HIGH (ref 0.44–1.00)
GFR, Estimated: 30 mL/min — ABNORMAL LOW
Glucose, Bld: 194 mg/dL — ABNORMAL HIGH (ref 70–99)
Potassium: 5.8 mmol/L — ABNORMAL HIGH (ref 3.5–5.1)
Sodium: 141 mmol/L (ref 135–145)

## 2025-01-04 LAB — URINALYSIS, COMPLETE (UACMP) WITH MICROSCOPIC
Bacteria, UA: NONE SEEN
Bilirubin Urine: NEGATIVE
Glucose, UA: >=500 mg/dL — AB
Hgb urine dipstick: NEGATIVE
Ketones, ur: NEGATIVE mg/dL
Leukocytes,Ua: NEGATIVE
Nitrite: NEGATIVE
Protein, ur: NEGATIVE mg/dL
Specific Gravity, Urine: 1.018 (ref 1.005–1.030)
pH: 5 (ref 5.0–8.0)

## 2025-01-04 LAB — CBC WITH DIFFERENTIAL/PLATELET
Basophils Absolute: 0 K/uL (ref 0.0–0.1)
Basophils Relative: 0 %
Eosinophils Absolute: 0 K/uL (ref 0.0–0.5)
Eosinophils Relative: 0 %
HCT: 35.8 % — ABNORMAL LOW (ref 36.0–46.0)
Hemoglobin: 11 g/dL — ABNORMAL LOW (ref 12.0–15.0)
Lymphocytes Relative: 84 %
Lymphs Abs: 59.7 K/uL — ABNORMAL HIGH (ref 0.7–4.0)
MCH: 28.8 pg (ref 26.0–34.0)
MCHC: 30.7 g/dL (ref 30.0–36.0)
MCV: 93.7 fL (ref 80.0–100.0)
Monocytes Absolute: 2.1 K/uL — ABNORMAL HIGH (ref 0.1–1.0)
Monocytes Relative: 3 %
Neutro Abs: 9.2 K/uL — ABNORMAL HIGH (ref 1.7–7.7)
Neutrophils Relative %: 13 %
Platelets: 128 K/uL — ABNORMAL LOW (ref 150–400)
RBC: 3.82 MIL/uL — ABNORMAL LOW (ref 3.87–5.11)
RDW: 18.3 % — ABNORMAL HIGH (ref 11.5–15.5)
Smear Review: NORMAL
WBC: 71.1 K/uL (ref 4.0–10.5)
nRBC: 0.1 % (ref 0.0–0.2)

## 2025-01-04 LAB — SODIUM, URINE, RANDOM: Sodium, Ur: 49 mmol/L

## 2025-01-04 LAB — GLUCOSE, CAPILLARY
Glucose-Capillary: 181 mg/dL — ABNORMAL HIGH (ref 70–99)
Glucose-Capillary: 282 mg/dL — ABNORMAL HIGH (ref 70–99)
Glucose-Capillary: 322 mg/dL — ABNORMAL HIGH (ref 70–99)
Glucose-Capillary: 195 mg/dL — ABNORMAL HIGH (ref 70–99)

## 2025-01-04 MED ORDER — IPRATROPIUM-ALBUTEROL 0.5-2.5 (3) MG/3ML IN SOLN
3.0000 mL | Freq: Three times a day (TID) | RESPIRATORY_TRACT | Status: DC
  Administered 2025-01-05 – 2025-01-06 (×4): 3 mL via RESPIRATORY_TRACT
  Filled 2025-01-04 (×5): qty 3

## 2025-01-04 MED ORDER — SODIUM ZIRCONIUM CYCLOSILICATE 10 G PO PACK
10.0000 g | PACK | Freq: Every day | ORAL | Status: AC
  Administered 2025-01-04 – 2025-01-05 (×2): 10 g via ORAL
  Filled 2025-01-04 (×3): qty 1

## 2025-01-04 MED ORDER — METHYLPREDNISOLONE SODIUM SUCC 40 MG IJ SOLR
40.0000 mg | Freq: Two times a day (BID) | INTRAMUSCULAR | Status: DC
  Administered 2025-01-04 – 2025-01-05 (×3): 40 mg via INTRAVENOUS
  Filled 2025-01-04 (×3): qty 1

## 2025-01-04 MED ORDER — IPRATROPIUM-ALBUTEROL 0.5-2.5 (3) MG/3ML IN SOLN
3.0000 mL | Freq: Four times a day (QID) | RESPIRATORY_TRACT | Status: DC
  Administered 2025-01-04 (×2): 3 mL via RESPIRATORY_TRACT
  Filled 2025-01-04 (×2): qty 3

## 2025-01-04 NOTE — Inpatient Diabetes Management (Signed)
 Inpatient Diabetes Program Recommendations  AACE/ADA: New Consensus Statement on Inpatient Glycemic Control (2015)  Target Ranges:  Prepandial:   less than 140 mg/dL      Peak postprandial:   less than 180 mg/dL (1-2 hours)      Critically ill patients:  140 - 180 mg/dL   Lab Results  Component Value Date   GLUCAP 282 (H) 01/04/2025   HGBA1C 8.0 (H) 12/28/2024    Review of Glycemic Control  Latest Reference Range & Units 01/03/25 07:41 01/03/25 12:08 01/03/25 17:03 01/03/25 21:39 01/04/25 07:37 01/04/25 12:50  Glucose-Capillary 70 - 99 mg/dL 746 (H) 95 691 (H) 870 (H) 181 (H) 282 (H)  (H): Data is abnormally high  Diabetes history: DM2 Outpatient Diabetes medications:  Trulicity  0.75 mg weekly, Jardiance  25 mg every day, Metformin  1000 mg QD Current orders for Inpatient glycemic control:  Novolog  0-15 units TID and 0-5 units at bedtime, Solumedrol 40 mg Q12H   Inpatient Diabetes Program Recommendations:    Might consider:  Novolog  0-9 units TID and 0-5 units at bedtime Novolog  3 units TID with meals if she consumes at least 50%  Thank you, Wyvonna Pinal, MSN, CDCES Diabetes Coordinator Inpatient Diabetes Program [phone] (team pager from 8a-5p)

## 2025-01-04 NOTE — Progress Notes (Signed)
 " [name] NOTE    BRIETTA [name]  FMW:[id number] DOB: November 10, 1946 DOA: 12/28/2024 PCP: Diona Perkins, MD    Brief Narrative:  Patient is a 79 year old female with past medical history significant for asthma, chronic diastolic CHF, chronic kidney disease, CLL, brain tumor, COPD, CVA, diabetes mellitus, PVD, hypertension and hyperlipidemia.  Patient was admitted with difficulty breathing.  BNP 77.6.  Hemoglobin A1c 8.0, COVID/influenza/RSV PCR negative.  Respiratory viral panel positive for rhinovirus.  Chest x-ray with no acute findings.  CT angiogram chest with no pulmonary embolism, emphysema with diffuse bronchial wall thickening suggestive of small airway disease, moderate central lobar emphysema, persistent mid to distal esophageal wall thickening, interval development intermediate bilateral axillary lymphadenopathy.    01/04/2025: Patient seen alongside patient's husband.  Patient is slowly improving.  Patient is still on 3 L of supplemental oxygen .  Will continue current regimen.  Likely discharge in the next 1 to 2 days i.e. if patient continues to improve.  Assessment & Plan:   Acute COPD exacerbation Acute hypoxic respiratory failure, POA Rhinovirus viral infection -Patient presented with 3-day history of progressive shortness of breath, cough, subjective fevers and bodyaches in the setting of known sick contact; spouse tested positive for COVID-19 recently.  On EMS arrival patient was noted to be hypoxic with SpO2 in the 70s in which she is not oxygen  dependent at baseline.  COVID/influenza/RSV PCR negative, although respiratory viral panel positive for rhinovirus.  Initially required 3 L nasal, and on ED presentation uptitrated to 30 L heated high flow nasal cannula.  Controlled with Trelegy Ellipta  at baseline -- Azithromycin  500 mg p.o. daily -- Pulmicort  neb twice daily -- Brovana  neb twice daily -- Yulpelri Neb daily -- Change oral prednisone  40 mg p.o. once daily to IV Solu-Medrol  40  mg twice daily.   -- DuoNebs 4 times daily scheduled -- Montelukast  10 mg p.o. 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 Onesimo.  Currently on surveillance.  WBC count elevated 61.9 on admission.  Peripheral smear notable for potential transformation. -- Medical oncology following, appreciate assistance -- WBC 61.9>62.3>58.9>74.1 (was 61.4 on 12/07/24), but 18.3 on 08/17/2024 01/04/2025: WBC of 71.1 (down from 77.6).  Repeat CBC in the morning.  Right renal mass concerning for neoplasm CT abdomen/pelvis with a large central lesion upper pole right kidney measuring 2.6 x 1.6 cm.  MR abdomen/pelvis with and without contrast with 2 lesions right kidney upper pole concerning for renal neoplasm, no metastatic disease within the abdomen, innumerable 5 mm or smaller size arterial enhancing foci through the liver. -- Medical oncology following as above -- Flow cytometry ordered and pending -- Will need outpatient follow-up with urology  CKD stage IIIb -- Cr 1.60>1.58>1.38>1.49, 1.45>1.40; stable (baseline 1.3 - 1.5) 01/05/2024: Serum creatinine of 1.75 today.  Check urinalysis and urine sodium.  Hyperkalemia: - Potassium of 5.8. - Lokelma  10 g p.o. daily x 2 days. - Renal panel in the morning.  DM2, with hyperglycemia Hemoglobin A1C 8.0 on 12/28/2024, not optimally controlled.  Home regimen includes Jardiance , metformin  1000 g p.o. every morning -- Moderate SSI for coverage -- CBG before every meal/at bedtime  HLD -- Atorvastatin  80 mg p.o. daily  GERD -- Continue Pepcid  20 mg p.o. nightly  Depression --  Cymbalta  60 mg p.o. daily -- Bupropion  100 g p.o. daily  Neuropathy -- Continue gabapentin ,  Cymbalta   Tobacco use disorder Counseled on need for complete cessation/abstinence.  On bupropion    DVT prophylaxis: heparin  injection 5,000 Units Start: 12/28/24 0845    Code Status: Full Code Family Communication: No family present at bedside this morning  Disposition Plan:  Level of care: Med-Surg Status is: Inpatient Remains inpatient appropriate because: Weaning of oxygen , awaiting further recommendations from oncology, MR abdomen today    Consultants:  Medical oncology  Procedures:  None  Antimicrobials:  Azithromycin  1/4>>   Subjective: -No new complaints. - Slowly improving.   Objective: Vitals:   01/03/25 1626 01/03/25 2007 01/04/25 0433 01/04/25 1001  BP:  (!) 118/59 136/65   Pulse:  66 67   Resp:  18 18   Temp:  97.8 F (36.6 C) 98.2 F (36.8 C)   TempSrc:  Oral    SpO2: 93% 99% 91% 92%  Weight:      Height:        Intake/Output Summary (Last 24 hours) at 01/04/2025 1137 Last data filed at 01/04/2025 0932 Gross per 24 hour  Intake 600 ml  Output 1400 ml  Net -800 ml   Filed Weights   12/28/24 1723  Weight: 63.9 kg    Examination:  Physical Exam: GEN: NAD, alert and oriented  HEENT: Mild pallor. PULM: Decreased air entry, with some expiratory wheeze. CV: S1-S2 GI: Abdomen is soft and nontender.    Extremities: No leg edema  Data Reviewed: I have personally reviewed following labs and imaging studies  CBC: Recent Labs  Lab 12/29/24 0234 12/30/24 0255 01/03/25 0459 01/03/25 1343 01/04/25 0446  WBC 62.3* 58.9* 74.1* 77.6* 71.1*  NEUTROABS  --   --   --  15.5* 9.2*  HGB 12.6 11.6* 12.5 12.8 11.0*  HCT 40.7 38.7 41.5 41.6 35.8*  MCV 94.2 94.9 94.3 93.7 93.7  PLT 130* 134* 143* 149* 128*   Basic Metabolic Panel: Recent Labs  Lab 12/30/24 0255 12/31/24 0252 01/01/25 0737 01/03/25 0459 01/04/25 0446  NA 142 143 141 138 141  K 5.4* 5.8* 4.4 5.5* 5.8*  CL 104 106 103 100 103  CO2 28 29 31 29 31   GLUCOSE 219* 219* 154* 229*  194*  BUN 24* 30* 24* 23 26*  CREATININE 1.38* 1.49* 1.45* 1.40* 1.75*  CALCIUM  9.4 9.2 9.0 9.0 9.3   GFR: Estimated Creatinine Clearance: 23.6 mL/min (A) (by C-G formula based on SCr of 1.75 mg/dL (H)). Liver Function Tests: No results for input(s): AST, ALT, ALKPHOS, BILITOT, PROT, ALBUMIN  in the last 168 hours.  No results for input(s): LIPASE, AMYLASE in the last 168 hours. No results for input(s): AMMONIA in the last 168 hours. Coagulation Profile: No results for input(s): INR, PROTIME in the last 168 hours. Cardiac Enzymes: No results for input(s): CKTOTAL, CKMB, CKMBINDEX, TROPONINI in the last 168 hours. BNP (last 3 results) Recent Labs    12/28/24 0321  PROBNP 77.6   HbA1C: No results for input(s): HGBA1C in the last 72 hours. CBG: Recent Labs  Lab 01/03/25 0741 01/03/25 1208 01/03/25 1703 01/03/25 2139 01/04/25 0737  GLUCAP 253* 95 308* 129* 181*   Lipid Profile: No results for input(s): CHOL, HDL, LDLCALC, TRIG, CHOLHDL, LDLDIRECT in the last 72 hours. Thyroid  Function Tests: No results for input(s): TSH, T4TOTAL, FREET4, T3FREE, THYROIDAB in the last 72 hours. Anemia Panel: No results for input(s): VITAMINB12, FOLATE, FERRITIN, TIBC, IRON , RETICCTPCT in the  last 72 hours. Sepsis Labs: No results for input(s): PROCALCITON, LATICACIDVEN in the last 168 hours.  Recent Results (from the past 240 hours)  Resp panel by RT-PCR (RSV, Flu A&B, Covid) Anterior Nasal Swab     Status: None   Collection Time: 12/28/24  3:10 AM   Specimen: Anterior Nasal Swab  Result Value Ref Range Status   SARS Coronavirus 2 by RT PCR NEGATIVE NEGATIVE Final    Comment: (NOTE) SARS-CoV-2 target nucleic acids are NOT DETECTED.  The SARS-CoV-2 RNA is generally detectable in upper respiratory specimens during the acute phase of infection. The lowest concentration of SARS-CoV-2 viral copies this assay can detect  is 138 copies/mL. A negative result does not preclude SARS-Cov-2 infection and should not be used as the sole basis for treatment or other patient management decisions. A negative result may occur with  improper specimen collection/handling, submission of specimen other than nasopharyngeal swab, presence of viral mutation(s) within the areas targeted by this assay, and inadequate number of viral copies(<138 copies/mL). A negative result must be combined with clinical observations, patient history, and epidemiological information. The expected result is Negative.  Fact Sheet for Patients:  bloggercourse.com  Fact Sheet for Healthcare Providers:  seriousbroker.it  This test is no t yet approved or cleared by the [place]  FDA and  has been authorized for detection and/or diagnosis of SARS-CoV-2 by FDA under an Emergency Use Authorization (EUA). This EUA will remain  in effect (meaning this test can be used) for the duration of the COVID-19 declaration under Section 564(b)(1) of the Act, 21 U.S.C.section 360bbb-3(b)(1), unless the authorization is terminated  or revoked sooner.       Influenza A by PCR NEGATIVE NEGATIVE Final   Influenza B by PCR NEGATIVE NEGATIVE Final    Comment: (NOTE) The Xpert Xpress SARS-CoV-2/FLU/RSV plus assay is intended as an aid in the diagnosis of influenza from Nasopharyngeal swab specimens and should not be used as a sole basis for treatment. Nasal washings and aspirates are unacceptable for Xpert Xpress SARS-CoV-2/FLU/RSV testing.  Fact Sheet for Patients: bloggercourse.com  Fact Sheet for Healthcare Providers: seriousbroker.it  This test is not yet approved or cleared by the [place]  FDA and has been authorized for detection and/or diagnosis of SARS-CoV-2 by FDA under an Emergency Use Authorization (EUA). This EUA will remain in effect  (meaning this test can be used) for the duration of the COVID-19 declaration under Section 564(b)(1) of the Act, 21 U.S.C. section 360bbb-3(b)(1), unless the authorization is terminated or revoked.     Resp Syncytial Virus by PCR NEGATIVE NEGATIVE Final    Comment: (NOTE) Fact Sheet for Patients: bloggercourse.com  Fact Sheet for Healthcare Providers: seriousbroker.it  This test is not yet approved or cleared by the [place]  FDA and has been authorized for detection and/or diagnosis of SARS-CoV-2 by FDA under an Emergency Use Authorization (EUA). This EUA will remain in effect (meaning this test can be used) for the duration of the COVID-19 declaration under Section 564(b)(1) of the Act, 21 U.S.C. section 360bbb-3(b)(1), unless the authorization is terminated or revoked.  Performed at [facility], [address], [place], [place] [address]   Respiratory (~20 pathogens) panel by PCR     Status: Abnormal   Collection Time: 12/28/24  8:46 AM   Specimen: Nasopharyngeal Swab; Respiratory  Result Value Ref Range Status   Adenovirus NOT DETECTED NOT DETECTED Final   Coronavirus 229E NOT DETECTED NOT DETECTED Final    Comment: (NOTE)  The Coronavirus on the Respiratory Panel, DOES NOT test for the novel  Coronavirus (2019 nCoV)    Coronavirus HKU1 NOT DETECTED NOT DETECTED Final   Coronavirus NL63 NOT DETECTED NOT DETECTED Final   Coronavirus OC43 NOT DETECTED NOT DETECTED Final   Metapneumovirus NOT DETECTED NOT DETECTED Final   Rhinovirus / Enterovirus DETECTED (A) NOT DETECTED Final   Influenza A NOT DETECTED NOT DETECTED Final   Influenza B NOT DETECTED NOT DETECTED Final   Parainfluenza Virus 1 NOT DETECTED NOT DETECTED Final   Parainfluenza Virus 2 NOT DETECTED NOT DETECTED Final   Parainfluenza Virus 3 NOT DETECTED NOT DETECTED Final   Parainfluenza Virus 4 NOT DETECTED NOT DETECTED Final    Respiratory Syncytial Virus NOT DETECTED NOT DETECTED Final   Bordetella pertussis NOT DETECTED NOT DETECTED Final   Bordetella Parapertussis NOT DETECTED NOT DETECTED Final   Chlamydophila pneumoniae NOT DETECTED NOT DETECTED Final   Mycoplasma pneumoniae NOT DETECTED NOT DETECTED Final    Comment: Performed at [facility], [address], [place], [place] [address]  MRSA Next Gen by PCR, Nasal     Status: None   Collection Time: 12/28/24  5:40 PM   Specimen: Nasal Mucosa; Nasal Swab  Result Value Ref Range Status   MRSA by PCR Next Gen NOT DETECTED NOT DETECTED Final    Comment: (NOTE) The GeneXpert MRSA Assay (FDA approved for NASAL specimens only), is one component of a comprehensive MRSA colonization surveillance program. It is not intended to diagnose MRSA infection nor to guide or monitor treatment for MRSA infections. Test performance is not FDA approved in patients less than 39 years old. Performed at [facility], [address], [place], [place] [address]          Radiology Studies: No results found.       Scheduled Meds:  amLODipine   10 mg Oral Daily   arformoterol   15 mcg Nebulization BID   atorvastatin   80 mg Oral Daily   azithromycin   500 mg Oral Daily   budesonide  (PULMICORT ) nebulizer solution  0.25 mg Nebulization BID   buPROPion   150 mg Oral Daily   Chlorhexidine  Gluconate Cloth  6 each Topical Q0600   clopidogrel   75 mg Oral Daily   DULoxetine   60 mg Oral Daily   gabapentin   600 mg Oral QHS   heparin   5,000 Units Subcutaneous Q8H   HYDROcodone  bit-homatropine  5 mL Oral QID   insulin  aspart  0-15 Units Subcutaneous TID WC   insulin  aspart  0-5 Units Subcutaneous QHS   ipratropium-albuterol   3 mL Nebulization QID   montelukast   10 mg Oral QHS   pantoprazole   40 mg Oral Daily   predniSONE   40 mg Oral Q breakfast   revefenacin   175 mcg Nebulization Daily   senna-docusate  1 tablet Oral BID   Continuous Infusions:    LOS: 7 days    Time spent: 5 minutes    Leatrice LILLETTE Chapel, MD [facility] Available via Epic secure chat 7am-7pm After these hours, please refer to coverage provider listed on [email or web address] 01/04/2025, 11:37 AM   "

## 2025-01-04 NOTE — Plan of Care (Signed)

## 2025-01-04 NOTE — Plan of Care (Signed)
   Problem: Education: Goal: Ability to describe self-care measures that may prevent or decrease complications (Diabetes Survival Skills Education) will improve Outcome: Progressing   Problem: Coping: Goal: Ability to adjust to condition or change in health will improve Outcome: Progressing   Problem: Clinical Measurements: Goal: Will remain free from infection Outcome: Progressing

## 2025-01-05 DIAGNOSIS — J441 Chronic obstructive pulmonary disease with (acute) exacerbation: Secondary | ICD-10-CM | POA: Diagnosis not present

## 2025-01-05 LAB — CBC WITH DIFFERENTIAL/PLATELET
Abs Immature Granulocytes: 1.23 K/uL — ABNORMAL HIGH (ref 0.00–0.07)
Basophils Absolute: 0.1 K/uL (ref 0.0–0.1)
Basophils Relative: 0 %
Eosinophils Absolute: 0 K/uL (ref 0.0–0.5)
Eosinophils Relative: 0 %
HCT: 37 % (ref 36.0–46.0)
Hemoglobin: 11.2 g/dL — ABNORMAL LOW (ref 12.0–15.0)
Immature Granulocytes: 2 %
Lymphocytes Relative: 49 %
Lymphs Abs: 32.4 K/uL — ABNORMAL HIGH (ref 0.7–4.0)
MCH: 28.2 pg (ref 26.0–34.0)
MCHC: 30.3 g/dL (ref 30.0–36.0)
MCV: 93.2 fL (ref 80.0–100.0)
Monocytes Absolute: 19.7 K/uL — ABNORMAL HIGH (ref 0.1–1.0)
Monocytes Relative: 30 %
Neutro Abs: 12.1 K/uL — ABNORMAL HIGH (ref 1.7–7.7)
Neutrophils Relative %: 19 %
Platelets: 123 K/uL — ABNORMAL LOW (ref 150–400)
RBC: 3.97 MIL/uL (ref 3.87–5.11)
RDW: 18 % — ABNORMAL HIGH (ref 11.5–15.5)
WBC: 65.6 K/uL (ref 4.0–10.5)
nRBC: 0.1 % (ref 0.0–0.2)

## 2025-01-05 LAB — RENAL FUNCTION PANEL
Albumin: 3.7 g/dL (ref 3.5–5.0)
Anion gap: 12 (ref 5–15)
BUN: 26 mg/dL — ABNORMAL HIGH (ref 8–23)
CO2: 25 mmol/L (ref 22–32)
Calcium: 8.9 mg/dL (ref 8.9–10.3)
Chloride: 101 mmol/L (ref 98–111)
Creatinine, Ser: 1.45 mg/dL — ABNORMAL HIGH (ref 0.44–1.00)
GFR, Estimated: 37 mL/min — ABNORMAL LOW
Glucose, Bld: 350 mg/dL — ABNORMAL HIGH (ref 70–99)
Phosphorus: 3.2 mg/dL (ref 2.5–4.6)
Potassium: 5.6 mmol/L — ABNORMAL HIGH (ref 3.5–5.1)
Sodium: 138 mmol/L (ref 135–145)

## 2025-01-05 LAB — MAGNESIUM: Magnesium: 2.2 mg/dL (ref 1.7–2.4)

## 2025-01-05 LAB — GLUCOSE, CAPILLARY
Glucose-Capillary: 245 mg/dL — ABNORMAL HIGH (ref 70–99)
Glucose-Capillary: 209 mg/dL — ABNORMAL HIGH (ref 70–99)
Glucose-Capillary: 399 mg/dL — ABNORMAL HIGH (ref 70–99)
Glucose-Capillary: 241 mg/dL — ABNORMAL HIGH (ref 70–99)

## 2025-01-05 MED ORDER — PREDNISONE 20 MG PO TABS
40.0000 mg | ORAL_TABLET | Freq: Every day | ORAL | Status: DC
  Administered 2025-01-06: 40 mg via ORAL
  Filled 2025-01-05: qty 2

## 2025-01-05 MED ORDER — ACETAMINOPHEN 325 MG PO TABS: 650.0000 mg | ORAL_TABLET | Freq: Four times a day (QID) | ORAL | Status: AC | PRN

## 2025-01-05 MED ORDER — POLYETHYLENE GLYCOL 3350 17 G PO PACK: 17.0000 g | PACK | Freq: Every day | ORAL | 0 refills | Status: AC | PRN

## 2025-01-05 MED ORDER — LOKELMA 10 G PO PACK: 10.0000 g | PACK | Freq: Every day | ORAL | 0 refills | Status: DC

## 2025-01-05 MED ORDER — SENNOSIDES-DOCUSATE SODIUM 8.6-50 MG PO TABS: 1.0000 | ORAL_TABLET | Freq: Two times a day (BID) | ORAL | 0 refills | Status: AC

## 2025-01-05 MED ORDER — PREDNISONE 10 MG PO TABS: ORAL_TABLET | ORAL | 0 refills | Status: AC

## 2025-01-05 NOTE — Progress Notes (Signed)
 Physical Therapy Treatment Patient Details Name: [name] [name] MRN: [id number] DOB: 06/13/1946 Today's Date: 01/05/2025   History of Present Illness Pt is a 78y.o. female admitted to the hospital with acute hypoxic respiratory failure due to acute COPD exacerbation. Medical history significant for, but not limited to chronic heart failure with preserved EF, CKD, CLL, ongoing tobacco abuse, COPD on room air, diabetes, hypertension, and hyperlipidemia.    Clinical Impression:  Pt is progressing toward acute PT goals this session with progression of ambulation distance. Pt ambulated ~93ft x2 with seated rest break and supervision for safety. Pt on 3L upon entry with O2 sats 98%, able to wean to RA at rest and O2 sats 93-94%. O2 desat with mobility on RA to 85%, titrated to 2L 91% with mobility. Recovery to 99% at end of session on 2L. Pt will benefit from continued skilled PT to increase their independence and maximize safety with mobility.          If plan is discharge home, recommend the following: A little help with walking and/or transfers;A little help with bathing/dressing/bathroom;Help with stairs or ramp for entrance;Assist for transportation;Assistance with cooking/housework   Can travel by Pension Scheme Manager (4 wheels) (pt owns rollator, but reports brakes are broken)    Recommendations for Other Services       Precautions / Restrictions Precautions Precautions: Fall Restrictions Weight Bearing Restrictions Per Provider Order: No     Mobility  Bed Mobility               General bed mobility comments: Pt in recliner pre/post session    Transfers Overall transfer level: Needs assistance Equipment used: Rollator (4 wheels) Transfers: Sit to/from Stand Sit to Stand: Supervision           General transfer comment: STS x2. supervision for safety, cues for locking/unlocking rollator brakes at appropriate times to  maximize safety.    Ambulation/Gait Ambulation/Gait assistance: Supervision Gait Distance (Feet): 70 Feet Assistive device: Rollator (4 wheels) Gait Pattern/deviations: Step-through pattern, Decreased stride length Gait velocity: decreased     General Gait Details: 41ft x2 with seated rest break on rollator due to fatigue and reported dizziness. Pt reports has been dealing with baseline dizziness/lightheadedness for long time. BP following initial bout of ambulation is 159/64 (92), 81bpm. Following 2nd bout of ambulation 148/65 (91), 73bpm. O2 low noted during session with mobiltiy 85% when trialed on RA, titrated to 2L and 91% with mobility. Pt maintained 93-94% on RA at rest. Up to 99% at rest on 2L at end of session.   Stairs             Wheelchair Mobility     Tilt Bed    Modified Rankin (Stroke Patients Only)       Balance Overall balance assessment: Needs assistance Sitting-balance support: No upper extremity supported, Feet supported Sitting balance-Leahy Scale: Good     Standing balance support: Bilateral upper extremity supported, During functional activity, Reliant on assistive device for balance Standing balance-Leahy Scale: Fair                              Hotel Manager: No apparent difficulties  Cognition Arousal: Alert Behavior During Therapy: WFL for tasks assessed/performed   PT - Cognitive impairments: No apparent impairments  Following commands: Intact      Cueing Cueing Techniques: Verbal cues  Exercises      General Comments General comments (skin integrity, edema, etc.): RN/MD updated on O2 sats with mobility, Educated pt on O2 sats with mobility and O2 goals/monitoring of O2 upon d/c.      Pertinent Vitals/Pain Pain Assessment Pain Assessment: Faces Faces Pain Scale: Hurts little more Pain Location: R LE and lower back after ambulating Pain Descriptors /  Indicators: Aching, Discomfort Pain Intervention(s): Limited activity within patient's tolerance, Monitored during session    Home Living Family/patient expects to be discharged to:: Private residence Living Arrangements: Spouse/significant other Available Help at Discharge: Family Type of Home: House Home Access: Stairs to enter Entrance Stairs-Rails: None Entrance Stairs-Number of Steps: 3   Home Layout: One level Home Equipment: Rollator (4 wheels);Cane - single point;Shower seat;BSC/3in1 (shower seat too big for shower) Additional Comments: Reports rollator has broken brakes. No O2 at baseline. Grandaughter with her when she has to go grocery shopping, s/o present for session    Prior Function            PT Goals (current goals can now be found in the care plan section) Acute Rehab PT Goals Patient Stated Goal: feel better PT Goal Formulation: With patient/family Time For Goal Achievement: 01/17/25 Potential to Achieve Goals: Good Progress towards PT goals: Progressing toward goals    Frequency    Min 3X/week      PT Plan      Co-evaluation              AM-PAC PT 6 Clicks Mobility   Outcome Measure  Help needed turning from your back to your side while in a flat bed without using bedrails?: A Little Help needed moving from lying on your back to sitting on the side of a flat bed without using bedrails?: A Little Help needed moving to and from a bed to a chair (including a wheelchair)?: A Little Help needed standing up from a chair using your arms (e.g., wheelchair or bedside chair)?: A Little Help needed to walk in hospital room?: A Little Help needed climbing 3-5 steps with a railing? : A Lot 6 Click Score: 17    End of Session Equipment Utilized During Treatment: Gait belt;Oxygen  Activity Tolerance: Patient tolerated treatment well Patient left: in chair;with call bell/phone within reach;with nursing/sitter in room;with family/visitor present Nurse  Communication: Mobility status;Other (comment) (O2 sats with mobility) PT Visit Diagnosis: Unsteadiness on feet (R26.81);Difficulty in walking, not elsewhere classified (R26.2)     Time: 8761-8690 PT Time Calculation (min) (ACUTE ONLY): 31 min  Charges:    $Therapeutic Activity: 23-37 mins PT General Charges $$ ACUTE PT VISIT: 1 Visit                    Tinnie BERRY PT, DPT  Acute Rehabilitation Services  Office [phone]  01/05/2025, 1:28 PM

## 2025-01-05 NOTE — Discharge Summary (Incomplete)
 Physician Discharge Summary  Patient ID: [name] [name] MRN: [id number] DOB/AGE: 03-10-1946 79 y.o.  Admit date: 12/28/2024 Discharge date: 01/05/2025  Admission Diagnoses:  Discharge Diagnoses:  Principal Problem:   COPD with acute exacerbation (HCC) Active Problems:   CLL (chronic lymphocytic leukemia) (HCC)   Acute hypoxic respiratory failure (HCC)   Discharged Condition: stable  Hospital Course:   Consults: None  Significant Diagnostic Studies:   Treatments: {Tx:18249}  Discharge Exam: Blood pressure 133/65, pulse 76, temperature 98.4 F (36.9 C), temperature source Oral, resp. rate 17, height 5' 2 (1.575 m), weight 63.9 kg, SpO2 93%. {physical zkjf:[id number]}  Disposition: Discharge disposition: 06-Home-Health Care Svc       Discharge Instructions     Increase activity slowly   Complete by: As directed       Allergies as of 01/05/2025       Reactions   Gazyva  [obinutuzumab ] Shortness Of Breath   Acute respiratory distress   Aspirin     Abdominal pain.   No [facility]alt[facility] Food Allergies Diarrhea, Nausea And Vomiting   Cabbage, stomach pain    Aspirin  Other (See Comments)   irritates stomach   Baclofen  Other (See Comments)   Stomach irritation        Medication List     STOP taking these medications    cholecalciferol 25 MCG (1000 UNIT) tablet Commonly known as: VITAMIN D3   Ferrex 150 150 MG capsule Generic drug: iron  polysaccharides   furosemide  20 MG tablet Commonly known as: LASIX    lidocaine  5 % Commonly known as: LIDODERM    losartan  50 MG tablet Commonly known as: COZAAR    metFORMIN  500 MG 24 hr tablet Commonly known as: GLUCOPHAGE -XR   tiZANidine  2 MG tablet Commonly known as: ZANAFLEX    varenicline  1 MG tablet Commonly known as: CHANTIX        TAKE these medications    acetaminophen  325 MG tablet Commonly known as: TYLENOL  Take 2 tablets (650 mg total) by mouth every 6 (six) hours as needed for mild pain (pain score  1-3) or fever (or Fever >/= 101). What changed:  medication strength how much to take reasons to take this   albuterol  108 (90 Base) MCG/ACT inhaler Commonly known as: VENTOLIN  HFA Inhale 2 puffs by mouth into the lungs every 4 hours as needed for wheezing/shortness of breath   albuterol  (2.5 MG/3ML) 0.083% nebulizer solution Commonly known as: PROVENTIL  Inhale 1 vial via nebulizer every 6 hours for wheezing or shortness of breath as needed   amLODipine  10 MG tablet Commonly known as: NORVASC  Take 1 tablet by mouth every day   atorvastatin  80 MG tablet Commonly known as: LIPITOR  Take 1 tablet by mouth daily   buPROPion  150 MG 24 hr tablet Commonly known as: Wellbutrin  XL Take 1 tablet (150 mg total) by mouth daily. For smoking cessation   clopidogrel  75 MG tablet Commonly known as: PLAVIX  Take 1 tablet by mouth every day   DULoxetine  60 MG capsule Commonly known as: CYMBALTA  Take 1 capsule by mouth every day   famotidine  20 MG tablet Commonly known as: PEPCID  Take 1 tablet (20 mg total) by mouth at bedtime.   gabapentin  600 MG tablet Commonly known as: Neurontin  Take 1 tablet (600 mg total) by mouth at bedtime.   Jardiance  25 MG Tabs tablet Generic drug: empagliflozin  Take 1 tablet by mouth every day   Lokelma  10 g Pack packet Generic drug: sodium zirconium cyclosilicate  Take 10 g by mouth daily for 5 days.   montelukast   10 MG tablet Commonly known as: SINGULAIR  Take 1 tablet by mouth at bedtime   nitroGLYCERIN  0.4 MG SL tablet Commonly known as: NITROSTAT  Dissolve 1 tablet under tongue every 5 minutes, up to 3 doses for chest pain   polyethylene glycol 17 g packet Commonly known as: MIRALAX  / GLYCOLAX  Take 17 g by mouth daily as needed for mild constipation.   predniSONE  10 MG tablet Commonly known as: DELTASONE  Prednisone  60 mg p.o. once daily for 3 days, then 40 mg p.o. once daily for 3 days, then 30 mg p.o. once daily for 3 days, then 20 mg p.o.  once a day for 3 days, then 10 mg p.o. once daily for 3 days and stop.   senna-docusate 8.6-50 MG tablet Commonly known as: Senokot-S Take 1 tablet by mouth 2 (two) times daily.   Trelegy Ellipta  100-62.5-25 MCG/ACT Aepb Generic drug: Fluticasone -Umeclidin-Vilant Inhale 1 puff by mouth every day   Trulicity  0.75 MG/0.5ML Soaj Generic drug: Dulaglutide  Inject 0.75 mg subcutaneously once a week               Durable Medical Equipment  (From admission, onward)           Start     Ordered   01/05/25 1543  For home use only DME 4 wheeled rolling walker with seat  Once       Question:  Patient needs a walker to treat with the following condition  Answer:  Vannie as ambulation aid   01/05/25 1542   01/05/25 1453  DME Oxygen   Once       Question Answer Comment  Length of Need 6 Months   Mode or (Route) Nasal cannula   Liters per Minute 2   Oxygen  delivery system: Gas      01/05/25 1453            Contact information for follow-up providers     Connect with your PCP/Specialist as discussed. Schedule an appointment as soon as possible for a visit .   Contact information: [email or web address] Call our physician referral line at [phone].        [facility]  Follow up.   Why: Agency will provide home oxygen  and deliver travel tank to room. They will also deliver Rollator to room. Contact information: [address] [place] [address] [phone]              Contact information for after-discharge care     Durable Medical Equipment     [facility] (DME) .   Service: Durable Medical Equipment Contact information: [address] [place]  [address] [phone]             Home Medical Care     [name]) .   Service: Home Health Services Contact information: [address] [place]   [address] [phone]                     Signed: Leatrice LILLETTE Chapel 01/05/2025, 5:42 PM

## 2025-01-05 NOTE — Progress Notes (Addendum)
 PT NOTE   SATURATION QUALIFICATIONS: (This note is used to comply with regulatory documentation for home oxygen )  Patient Saturations on Room Air at Rest = 93%  Patient Saturations on Room Air while Ambulating = 85%  Patient Saturations on 2 Liters of oxygen  while Ambulating = 91 %  Please briefly explain why patient needs home oxygen : O2 desat while on RA with mobility

## 2025-01-05 NOTE — Progress Notes (Signed)
 SATURATION QUALIFICATIONS: (This note is used to comply with regulatory documentation for home oxygen )  Patient Saturations on Room Air at Rest = 93%  Patient Saturations on Room Air while Ambulating = 85%  Patient Saturations on 2 Liters of oxygen  while Ambulating = 91%  Please briefly explain why patient needs home oxygen :

## 2025-01-05 NOTE — TOC Initial Note (Signed)
 Transition of Care [facility]) - Initial/Assessment Note    Patient Details  Name: [name] [name] MRN: [id number] Date of Birth: Dec 29, 1945  Transition of Care [facility]) CM/SW Contact:    Sonda Manuella Quill, RN Phone Number: 01/05/2025, 4:21 PM  Clinical Narrative:                 Orders received for HHPT/OT, Rollator, and oxygen ; spoke w/ pt and spouse Sura Canul ([phone]) in room; pt lives at home w/ her spouse; she plans to return w/ his support at d/c; he will provide transportation; insurance/PCP verified; pt said she has difficulty paying utilities; she denied other SDOH risks; pt has cane, and BSC; she does not have HH services or home oxygen ; she agreed to receive recc services/DME; pt said she does not have agency preference for either; pt agreed to receive resources for social services and financial assistance; referral for HHPT/OT accepted by [facility] at [place]; referral for home oxygen  and Rollator accepted by [employer] at [facility]; travel tank and rollator will be delivered to room before D/C; agency contact info placed in follow up provider section of d/c instructions; pt verbalized understanding she will make her own follow up appts w/ PCP and Pulmonary as it is weekend; no IP CM needs.  Expected Discharge Plan: Home w Home Health Services Barriers to Discharge: No Barriers Identified   Patient Goals and CMS Choice Patient states their goals for this hospitalization and ongoing recovery are:: home          Expected Discharge Plan and Services   Discharge Planning Services: CM Consult   Living arrangements for the past 2 months: Single Family Home Expected Discharge Date: 01/05/25               DME Arranged: Oxygen , Walker rolling with seat DME Agency: [facility] Date DME Agency Contacted: 01/05/25 Time DME Agency Contacted: [phone] Representative spoke with at DME Agency: [facility] HH Arranged: PT, OT HH Agency: [facility] Date [facility] Agency Contacted:  01/05/25 Time HH Agency Contacted: [phone] Representative spoke with at [facility] Agency: [facility]  Prior Living Arrangements/Services Living arrangements for the past 2 months: Single Family Home Lives with:: Spouse Patient language and need for interpreter reviewed:: Yes Do you feel safe going back to the place where you live?: Yes      Need for Family Participation in Patient Care: Yes (Comment) Care giver support system in place?: Yes (comment) Current home services: DME (cane, BSC) Criminal Activity/Legal Involvement Pertinent to Current Situation/Hospitalization: No - Comment as needed  Activities of Daily Living   ADL Screening (condition at time of admission) Independently performs ADLs?: Yes (appropriate for developmental age) Is the patient deaf or have difficulty hearing?: No Does the patient have difficulty seeing, even when wearing glasses/contacts?: No Does the patient have difficulty concentrating, remembering, or making decisions?: No  Permission Sought/Granted Permission sought to share information with : Case Manager Permission granted to share information with : Yes, Verbal Permission Granted  Share Information with NAME: Case Manager     Permission granted to share info w Relationship: Savannha Welle (spouse) [phone]     Emotional Assessment Appearance:: Appears stated age Attitude/Demeanor/Rapport: Gracious Affect (typically observed): Accepting Orientation: : Oriented to Self, Oriented to Place, Oriented to  Time, Oriented to Situation Alcohol / Substance Use: Not Applicable Psych Involvement: No (comment)  Admission diagnosis:  Wheezing [R06.2] COPD with acute exacerbation (HCC) [J44.1] Acute hypoxic respiratory failure (HCC) [J96.01] Patient Active Problem List   Diagnosis  Date Noted   COPD with acute exacerbation (HCC) 12/28/2024   Acute hypoxic respiratory failure (HCC) 12/28/2024   Myofascial pain 04/25/2024   Cigarette smoker 04/25/2024   Respiratory  distress 02/03/2024   Foraminal stenosis of lumbosacral region 01/14/2023   Right leg swelling 09/03/2022   Meningioma (HCC) 09/03/2022   Polypharmacy 04/07/2022   Iron  deficiency anemia 02/12/2021   Peripheral arterial disease 10/03/2020   (HFpEF) heart failure with preserved ejection fraction (HCC) 06/11/2020   Muscle spasm 03/07/2020   Venous insufficiency (chronic) (peripheral) 06/22/2019   Pulmonary nodules 01/04/2018   Mass of left lower leg 01/04/2018   Chronic kidney disease (CKD), stage III (moderate) (HCC) 06/04/2016   Chronic back pain 10/02/2015   DM (diabetes mellitus), type 2 with peripheral vascular complications (HCC) 06/11/2009   CLL (chronic lymphocytic leukemia) (HCC) 04/11/2009   HYPERCHOLESTEROLEMIA 02/23/2007   TOBACCO DEPENDENCE 02/23/2007   Essential hypertension 02/23/2007   COPD (chronic obstructive pulmonary disease) (HCC) 02/23/2007   GASTROESOPHAGEAL REFLUX, NO ESOPHAGITIS 02/23/2007   PCP:  Diona Perkins, MD Pharmacy:   [facility] [place], [place] - [address] [place] [address] Phone: [phone] Fax: [phone]  [facility] - [place] - [address] [place] [address] Phone: [phone] Fax: [phone]     Social Drivers of Health (SDOH) Social History: SDOH Screenings   Food Insecurity: No Food Insecurity (01/05/2025)  Housing: Low Risk (01/05/2025)  Transportation Needs: No Transportation Needs (01/05/2025)  Utilities: At Risk (01/05/2025)  Alcohol Screen: Low Risk (03/29/2024)  Depression (PHQ2-9): Low Risk (11/07/2024)  Financial Resource Strain: Low Risk (03/29/2024)  Physical Activity: Insufficiently Active (03/29/2024)  Social Connections: Moderately Integrated (12/28/2024)  Stress: No Stress Concern Present (03/29/2024)  Tobacco Use: High Risk (12/29/2024)  Health Literacy: Adequate Health Literacy (03/29/2024)   SDOH Interventions: Food Insecurity Interventions:  Intervention Not Indicated, Inpatient TOC Housing Interventions: Intervention Not Indicated, Inpatient TOC Transportation Interventions: Intervention Not Indicated, Inpatient TOC Utilities Interventions: Community Resources Provided, Inpatient TOC   Readmission Risk Interventions    01/05/2025    4:17 PM 01/01/2025    8:50 AM 02/06/2024    1:26 PM  Readmission Risk Prevention Plan  Transportation Screening Complete Complete Complete  PCP or Specialist Appt within 5-7 Days   Complete  PCP or Specialist Appt within 3-5 Days  Complete   Home Care Screening   Complete  Medication Review (RN CM)   Complete  HRI or Home Care Consult Complete Complete   Social Work Consult for Recovery Care Planning/Counseling Complete Complete   Palliative Care Screening Not Applicable Not Applicable   Medication Review Ocean[facility]) Complete Complete

## 2025-01-05 NOTE — Plan of Care (Signed)

## 2025-01-05 NOTE — Evaluation (Signed)
 Occupational Therapy Evaluation Patient Details Name: [name] [name] MRN: [id number] DOB: 05/10/1946 Today's Date: 01/05/2025   History of Present Illness   Pt is a 78y.o. female admitted to the hospital with acute hypoxic respiratory failure due to acute COPD exacerbation. Medical history significant for, but not limited to chronic heart failure with preserved EF, CKD, CLL, ongoing tobacco abuse, COPD on room air, diabetes, hypertension, and hyperlipidemia.     Clinical Impressions PTA, patient lives at home with family and was mod I with rollator prior to hospital. Currently, patient presents with deficits outlined below (see OT Problem List for details) most significantly decreased activity tolerance needing supplemental O2, decreased balance and muscle strength limiting BADL's and functional mobility. Recommending a new rollator if covered by insurance patient requesting due to current brakes are in ill repair as well as HHOT services with family support/assist upon discharge from acute hospital setting.  Patient requires continued Acute care hospital level OT services to progress safety and functional performance and allow for discharge.       If plan is discharge home, recommend the following:   A little help with walking and/or transfers;A little help with bathing/dressing/bathroom;Assistance with cooking/housework;Assist for transportation;Help with stairs or ramp for entrance     Functional Status Assessment   Patient has had a recent decline in their functional status and demonstrates the ability to make significant improvements in function in a reasonable and predictable amount of time.     Equipment Recommendations   Other (comment) (new rollator if coverewd due to brakes broken)      Precautions/Restrictions   Precautions Precautions: Fall Restrictions Weight Bearing Restrictions Per Provider Order: No     Mobility Bed Mobility               General  bed mobility comments: pt in recliner and remained post session    Transfers Overall transfer level: Needs assistance Equipment used: Rolling walker (2 wheels) Transfers: Sit to/from Stand, Bed to chair/wheelchair/BSC Sit to Stand: Contact guard assist     Step pivot transfers: Contact guard assist     General transfer comment: UE's use to power up and initial steadying, O2 tube mngt      Balance Overall balance assessment: Needs assistance Sitting-balance support: No upper extremity supported, Feet supported Sitting balance-Leahy Scale: Good     Standing balance support: Bilateral upper extremity supported, During functional activity, Reliant on assistive device for balance Standing balance-Leahy Scale: Poor Standing balance comment: reliant on external support                           ADL either performed or assessed with clinical judgement   ADL Overall ADL's : Needs assistance/impaired Eating/Feeding: Independent   Grooming: Wash/dry hands;Wash/dry face;Oral care;Standing;Supervision/safety   Upper Body Bathing: Set up;Sitting   Lower Body Bathing: Contact guard assist;Sit to/from stand   Upper Body Dressing : Set up;Sitting   Lower Body Dressing: Contact guard assist;Sit to/from stand   Toilet Transfer: Contact guard assist;Rolling walker (2 wheels)   Toileting- Clothing Manipulation and Hygiene: Set up       Functional mobility during ADLs: Contact guard assist;Rolling walker (2 wheels) General ADL Comments: limited tolerance in standing and functional reach     Vision Baseline Vision/History: 0 No visual deficits;1 Wears glasses              Pertinent Vitals/Pain Pain Assessment Pain Assessment: 0-10 Pain Score: 2  Pain Location: mid  chest Pain Descriptors / Indicators: Sore, Tender Pain Intervention(s): Limited activity within patient's tolerance, Monitored during session, Premedicated before session, Repositioned      Extremity/Trunk Assessment Upper Extremity Assessment Upper Extremity Assessment: Generalized weakness;Right hand dominant   Lower Extremity Assessment Lower Extremity Assessment: Defer to PT evaluation   Cervical / Trunk Assessment Cervical / Trunk Assessment: Normal   Communication Communication Communication: No apparent difficulties   Cognition Arousal: Alert Behavior During Therapy: WFL for tasks assessed/performed Cognition: No apparent impairments                               Following commands: Intact       Cueing  General Comments   Cueing Techniques: Verbal cues  stood for light self care and remained on O2 with no SOB up to 5 minutes, RW support, fatigues after that returned to recliner           Home Living Family/patient expects to be discharged to:: Private residence Living Arrangements: Spouse/significant other Available Help at Discharge: Family Type of Home: House Home Access: Stairs to enter Secretary/administrator of Steps: 3 Entrance Stairs-Rails: None Home Layout: One level     Bathroom Shower/Tub: Tub/shower unit;Walk-in shower   Bathroom Toilet: Standard     Home Equipment: Rollator (4 wheels);Cane - single point;Shower seat;BSC/3in1 (shower seat too big for shower)   Additional Comments: Reports rollator has broken brakes. No O2 at baseline. Grandaughter with her when she has to go grocery shopping, s/o present for session      Prior Functioning/Environment Prior Level of Function : Independent/Modified Independent             Mobility Comments: uses rollator or cane depending on how feeling. Denies falls. Spouse sometimes assist with stairs to enter home. Uses BSC when have to ADLs Comments: Sponge baths due to decreased supervision for shower transfers.  Tries to do as much as she can on her own states that spouse does not always like to assist (reports complaints when asked) so she does what she can and  takes her time. Does some cooking when spouse does not and does shopping, drives.    OT Problem List: Decreased strength;Decreased activity tolerance;Impaired balance (sitting and/or standing);Cardiopulmonary status limiting activity;Pain   OT Treatment/Interventions: Self-care/ADL training;Therapeutic exercise;Neuromuscular education;Energy conservation;DME and/or AE instruction;Therapeutic activities;Patient/family education;Balance training      OT Goals(Current goals can be found in the care plan section)   Acute Rehab OT Goals Patient Stated Goal: to get a new rollator for home OT Goal Formulation: With patient/family Time For Goal Achievement: 01/19/25 Potential to Achieve Goals: Good   OT Frequency:  Min 2X/week       AM-PAC OT 6 Clicks Daily Activity     Outcome Measure Help from another person eating meals?: None Help from another person taking care of personal grooming?: A Little Help from another person toileting, which includes using toliet, bedpan, or urinal?: A Little Help from another person bathing (including washing, rinsing, drying)?: A Little Help from another person to put on and taking off regular upper body clothing?: A Little Help from another person to put on and taking off regular lower body clothing?: A Little 6 Click Score: 19   End of Session Equipment Utilized During Treatment: Rolling walker (2 wheels);Gait belt;Oxygen  Nurse Communication: Mobility status  Activity Tolerance: Patient limited by fatigue Patient left: in chair;with call bell/phone within reach;with chair alarm set;with family/visitor present  OT Visit Diagnosis: Unsteadiness on feet (R26.81);Muscle weakness (generalized) (M62.81);Pain Pain - part of body:  (chest)                Time: 8957-8894 OT Time Calculation (min): 23 min Charges:  OT General Charges $OT Visit: 1 Visit OT Evaluation $OT Eval Low Complexity: 1 Low  Kane Kusek OT/L Acute Rehabilitation Department  [phone]  01/05/2025, 12:50 PM

## 2025-01-05 NOTE — Progress Notes (Signed)
 " PROGRESS NOTE    [name] [name]  FMW:[id number] DOB: [birth year] DOA: 12/28/2024 PCP: Diona Perkins, MD    Brief Narrative:  Patient is a 79 year old female with past medical history significant for asthma, chronic diastolic CHF, chronic kidney disease, CLL, brain tumor, COPD, CVA, diabetes mellitus, PVD, hypertension and hyperlipidemia.  Patient was admitted with difficulty breathing.  BNP 77.6.  Hemoglobin A1c 8.0, COVID/influenza/RSV PCR negative.  Respiratory viral panel positive for rhinovirus.  Chest x-ray with no acute findings.  CT angiogram chest with no pulmonary embolism, emphysema with diffuse bronchial wall thickening suggestive of small airway disease, moderate central lobar emphysema, persistent mid to distal esophageal wall thickening, interval development intermediate bilateral axillary lymphadenopathy.    01/04/2025: Patient seen alongside patient's husband.  Patient is slowly improving.  Patient is still on 3 L of supplemental oxygen .  Will continue current regimen.  Likely discharge in the next 1 to 2 days i.e. if patient continues to improve.  01/05/2025: Will hold patient's discharge today.  Blood sugar is currently elevated (399), potassium was 5.6.  Will likely discharge patient home tomorrow.  Patient and patient's husband updated, in the presence of patient's nurse.  Will discontinue IV Solu-Medrol .  Start prednisone  40 mg p.o. once daily again tomorrow.  Optimize blood sugar control.  Assessment & Plan:   Acute COPD exacerbation Acute hypoxic respiratory failure, POA Rhinovirus viral infection -Patient presented with 3-day history of progressive shortness of breath, cough, subjective fevers and bodyaches in the setting of known sick contact; spouse tested positive for COVID-19 recently.  On EMS arrival patient was noted to be hypoxic with SpO2 in the 70s in which she is not oxygen  dependent at baseline.  COVID/influenza/RSV PCR negative, although respiratory viral panel  positive for rhinovirus.  Initially required 3 L nasal, and on ED presentation uptitrated to 30 L heated high flow nasal cannula.  Controlled with Trelegy Ellipta  at baseline -- Azithromycin  500 mg p.o. daily -- Pulmicort  neb twice daily -- Brovana  neb twice daily -- Yulpelri Neb daily -- Change oral prednisone  40 mg p.o. once daily to IV Solu-Medrol  40 mg twice daily.   -- DuoNebs 4 times daily scheduled -- Montelukast  10 mg p.o. nightly -- Albuterol  neb every 2 hours as needed wheezing/shortness of breath -- Continue supplemental oxygen , maintain SpO2 > 88%, weaned down to 2 L nasal cannula this morning -- Incentive spirometry -- PT evaluation, ambulatory O2 screen today 01/05/2025: Discontinue IV Solu-Medrol .  Start prednisone  tomorrow (see above documentation).  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 Onesimo.  Currently on surveillance.  WBC count elevated 61.9 on admission.  Peripheral smear notable for potential transformation. -- Medical oncology following, appreciate assistance -- WBC 61.9>62.3>58.9>74.1 (was 61.4 on 12/07/24), but 18.3 on 08/17/2024 01/04/2025: WBC of 71.1 (down from 77.6).  Repeat CBC in the morning.  Right renal mass concerning for neoplasm CT abdomen/pelvis with a large central lesion upper pole right kidney measuring 2.6 x 1.6 cm.  MR abdomen/pelvis with and without contrast with 2 lesions right kidney upper pole concerning for renal neoplasm, no metastatic disease within the abdomen, innumerable 5 mm or smaller size arterial enhancing foci through the liver. -- Medical oncology following as above -- Flow cytometry ordered and pending -- Will need outpatient follow-up with urology  CKD stage IIIb -- Cr 1.60>1.58>1.38>1.49, 1.45>1.40; stable (baseline 1.3 - 1.5) 01/05/2024: Serum creatinine of 1.75 today.  Check urinalysis and urine sodium.  Hyperkalemia: - Potassium of 5.8. - Lokelma   10 g p.o. daily x 2 days. -  Renal panel in the morning. 01/05/2025: Potassium level of 5.6 today.  Continue Lokelma .  Elevated blood sugar may be contributing to high potassium.  DM2, with hyperglycemia Hemoglobin A1C 8.0 on 12/28/2024, not optimally controlled.  Home regimen includes Jardiance , metformin  1000 g p.o. every morning -- Moderate SSI for coverage -- CBG before every meal/at bedtime 01/05/2025: Currently uncontrolled due to steroids.  HLD -- Atorvastatin  80 mg p.o. daily  GERD -- Continue Pepcid  20 mg p.o. nightly  Depression -- Cymbalta  60 mg p.o. daily -- Bupropion  100 g p.o. daily  Neuropathy -- Continue gabapentin , Cymbalta   Tobacco use disorder Counseled on need for complete cessation/abstinence.  On bupropion    DVT prophylaxis: heparin  injection 5,000 Units Start: 12/28/24 0845    Code Status: Full Code Family Communication: No family present at bedside this morning  Disposition Plan:  Level of care: Med-Surg Status is: Inpatient Remains inpatient appropriate because: Weaning of oxygen , awaiting further recommendations from oncology, MR abdomen today    Consultants:  Medical oncology  Procedures:  None  Antimicrobials:  Azithromycin  1/4>>   Subjective: -No new complaints. - Slowly improving.   Objective: Vitals:   01/05/25 0843 01/05/25 0918 01/05/25 1425 01/05/25 1602  BP:  (!) 145/59  133/65  Pulse:    76  Resp:    17  Temp:    98.4 F (36.9 C)  TempSrc:    Oral  SpO2: 97%  95% 93%  Weight:      Height:        Intake/Output Summary (Last 24 hours) at 01/05/2025 1750 Last data filed at 01/05/2025 1400 Gross per 24 hour  Intake 480 ml  Output 800 ml  Net -320 ml   Filed Weights   12/28/24 1723  Weight: 63.9 kg    Examination:  Physical Exam: GEN: NAD, alert and oriented  HEENT: Mild pallor. PULM: Improved air entry.  Some expiratory wheeze, but improved.   CV: S1-S2 GI: Abdomen is soft and nontender.    Extremities: No leg edema  Data Reviewed:  I have personally reviewed following labs and imaging studies  CBC: Recent Labs  Lab 12/30/24 0255 01/03/25 0459 01/03/25 1343 01/04/25 0446 01/05/25 0440  WBC 58.9* 74.1* 77.6* 71.1* 65.6*  NEUTROABS  --   --  15.5* 9.2* 12.1*  HGB 11.6* 12.5 12.8 11.0* 11.2*  HCT 38.7 41.5 41.6 35.8* 37.0  MCV 94.9 94.3 93.7 93.7 93.2  PLT 134* 143* 149* 128* 123*   Basic Metabolic Panel: Recent Labs  Lab 12/31/24 0252 01/01/25 0737 01/03/25 0459 01/04/25 0446 01/05/25 0440 01/05/25 0441  NA 143 141 138 141  --  138  K 5.8* 4.4 5.5* 5.8*  --  5.6*  CL 106 103 100 103  --  101  CO2 29 31 29 31   --  25  GLUCOSE 219* 154* 229* 194*  --  350*  BUN 30* 24* 23 26*  --  26*  CREATININE 1.49* 1.45* 1.40* 1.75*  --  1.45*  CALCIUM  9.2 9.0 9.0 9.3  --  8.9  MG  --   --   --   --  2.2  --   PHOS  --   --   --   --   --  3.2   GFR: Estimated Creatinine Clearance: 28.5 mL/min (A) (by C-G formula based on SCr of 1.45 mg/dL (H)). Liver Function Tests: Recent Labs  Lab 01/05/25 0441  ALBUMIN  3.7  No results for input(s): LIPASE, AMYLASE in the last 168 hours. No results for input(s): AMMONIA in the last 168 hours. Coagulation Profile: No results for input(s): INR, PROTIME in the last 168 hours. Cardiac Enzymes: No results for input(s): CKTOTAL, CKMB, CKMBINDEX, TROPONINI in the last 168 hours. BNP (last 3 results) Recent Labs    12/28/24 0321  PROBNP 77.6   HbA1C: No results for input(s): HGBA1C in the last 72 hours. CBG: Recent Labs  Lab 01/04/25 1815 01/04/25 2143 01/05/25 0724 01/05/25 1130 01/05/25 1642  GLUCAP 322* 195* 245* 209* 399*   Lipid Profile: No results for input(s): CHOL, HDL, LDLCALC, TRIG, CHOLHDL, LDLDIRECT in the last 72 hours. Thyroid  Function Tests: No results for input(s): TSH, T4TOTAL, FREET4, T3FREE, THYROIDAB in the last 72 hours. Anemia Panel: No results for input(s): VITAMINB12, FOLATE, FERRITIN,  TIBC, IRON , RETICCTPCT in the last 72 hours. Sepsis Labs: No results for input(s): PROCALCITON, LATICACIDVEN in the last 168 hours.  Recent Results (from the past 240 hours)  Resp panel by RT-PCR (RSV, Flu A&B, Covid) Anterior Nasal Swab     Status: None   Collection Time: 12/28/24  3:10 AM   Specimen: Anterior Nasal Swab  Result Value Ref Range Status   SARS Coronavirus 2 by RT PCR NEGATIVE NEGATIVE Final    Comment: (NOTE) SARS-CoV-2 target nucleic acids are NOT DETECTED.  The SARS-CoV-2 RNA is generally detectable in upper respiratory specimens during the acute phase of infection. The lowest concentration of SARS-CoV-2 viral copies this assay can detect is 138 copies/mL. A negative result does not preclude SARS-Cov-2 infection and should not be used as the sole basis for treatment or other patient management decisions. A negative result may occur with  improper specimen collection/handling, submission of specimen other than nasopharyngeal swab, presence of viral mutation(s) within the areas targeted by this assay, and inadequate number of viral copies(<138 copies/mL). A negative result must be combined with clinical observations, patient history, and epidemiological information. The expected result is Negative.  Fact Sheet for Patients:  bloggercourse.com  Fact Sheet for Healthcare Providers:  seriousbroker.it  This test is no t yet approved or cleared by the [place]  FDA and  has been authorized for detection and/or diagnosis of SARS-CoV-2 by FDA under an Emergency Use Authorization (EUA). This EUA will remain  in effect (meaning this test can be used) for the duration of the COVID-19 declaration under Section 564(b)(1) of the Act, 21 U.S.C.section 360bbb-3(b)(1), unless the authorization is terminated  or revoked sooner.       Influenza A by PCR NEGATIVE NEGATIVE Final   Influenza B by PCR NEGATIVE  NEGATIVE Final    Comment: (NOTE) The Xpert Xpress SARS-CoV-2/FLU/RSV plus assay is intended as an aid in the diagnosis of influenza from Nasopharyngeal swab specimens and should not be used as a sole basis for treatment. Nasal washings and aspirates are unacceptable for Xpert Xpress SARS-CoV-2/FLU/RSV testing.  Fact Sheet for Patients: bloggercourse.com  Fact Sheet for Healthcare Providers: seriousbroker.it  This test is not yet approved or cleared by the [place]  FDA and has been authorized for detection and/or diagnosis of SARS-CoV-2 by FDA under an Emergency Use Authorization (EUA). This EUA will remain in effect (meaning this test can be used) for the duration of the COVID-19 declaration under Section 564(b)(1) of the Act, 21 U.S.C. section 360bbb-3(b)(1), unless the authorization is terminated or revoked.     Resp Syncytial Virus by PCR NEGATIVE NEGATIVE Final    Comment: (NOTE) Fact Sheet  for Patients: bloggercourse.com  Fact Sheet for Healthcare Providers: seriousbroker.it  This test is not yet approved or cleared by the [place]  FDA and has been authorized for detection and/or diagnosis of SARS-CoV-2 by FDA under an Emergency Use Authorization (EUA). This EUA will remain in effect (meaning this test can be used) for the duration of the COVID-19 declaration under Section 564(b)(1) of the Act, 21 U.S.C. section 360bbb-3(b)(1), unless the authorization is terminated or revoked.  Performed at [facility], [address], [place], [place] [address]   Respiratory (~20 pathogens) panel by PCR     Status: Abnormal   Collection Time: 12/28/24  8:46 AM   Specimen: Nasopharyngeal Swab; Respiratory  Result Value Ref Range Status   Adenovirus NOT DETECTED NOT DETECTED Final   Coronavirus 229E NOT DETECTED NOT DETECTED Final    Comment: (NOTE) The  Coronavirus on the Respiratory Panel, DOES NOT test for the novel  Coronavirus (2019 nCoV)    Coronavirus HKU1 NOT DETECTED NOT DETECTED Final   Coronavirus NL63 NOT DETECTED NOT DETECTED Final   Coronavirus OC43 NOT DETECTED NOT DETECTED Final   Metapneumovirus NOT DETECTED NOT DETECTED Final   Rhinovirus / Enterovirus DETECTED (A) NOT DETECTED Final   Influenza A NOT DETECTED NOT DETECTED Final   Influenza B NOT DETECTED NOT DETECTED Final   Parainfluenza Virus 1 NOT DETECTED NOT DETECTED Final   Parainfluenza Virus 2 NOT DETECTED NOT DETECTED Final   Parainfluenza Virus 3 NOT DETECTED NOT DETECTED Final   Parainfluenza Virus 4 NOT DETECTED NOT DETECTED Final   Respiratory Syncytial Virus NOT DETECTED NOT DETECTED Final   Bordetella pertussis NOT DETECTED NOT DETECTED Final   Bordetella Parapertussis NOT DETECTED NOT DETECTED Final   Chlamydophila pneumoniae NOT DETECTED NOT DETECTED Final   Mycoplasma pneumoniae NOT DETECTED NOT DETECTED Final    Comment: Performed at [facility], [address], [place], [place] [address]  MRSA Next Gen by PCR, Nasal     Status: None   Collection Time: 12/28/24  5:40 PM   Specimen: Nasal Mucosa; Nasal Swab  Result Value Ref Range Status   MRSA by PCR Next Gen NOT DETECTED NOT DETECTED Final    Comment: (NOTE) The GeneXpert MRSA Assay (FDA approved for NASAL specimens only), is one component of a comprehensive MRSA colonization surveillance program. It is not intended to diagnose MRSA infection nor to guide or monitor treatment for MRSA infections. Test performance is not FDA approved in patients less than 55 years old. Performed at [facility], [address], [place], [place] [address]          Radiology Studies: No results found.       Scheduled Meds:  amLODipine   10 mg Oral Daily   arformoterol   15 mcg Nebulization BID   atorvastatin   80 mg Oral Daily   budesonide  (PULMICORT ) nebulizer solution   0.25 mg Nebulization BID   buPROPion   150 mg Oral Daily   Chlorhexidine  Gluconate Cloth  6 each Topical Q0600   clopidogrel   75 mg Oral Daily   DULoxetine   60 mg Oral Daily   gabapentin   600 mg Oral QHS   heparin   5,000 Units Subcutaneous Q8H   HYDROcodone  bit-homatropine  5 mL Oral QID   insulin  aspart  0-15 Units Subcutaneous TID WC   insulin  aspart  0-5 Units Subcutaneous QHS   ipratropium-albuterol   3 mL Nebulization TID   montelukast   10 mg Oral QHS   pantoprazole   40 mg Oral Daily   [START ON 01/06/2025] predniSONE   40 mg Oral Q breakfast   revefenacin   175 mcg Nebulization Daily   senna-docusate  1 tablet Oral BID   Continuous Infusions:   LOS: 8 days    Time spent: 35 minutes    Leatrice LILLETTE Chapel, MD [facility] Available via Epic secure chat 7am-7pm After these hours, please refer to coverage provider listed on [email or web address] 01/05/2025, 5:50 PM   "

## 2025-01-06 DIAGNOSIS — J441 Chronic obstructive pulmonary disease with (acute) exacerbation: Secondary | ICD-10-CM | POA: Diagnosis not present

## 2025-01-06 LAB — RENAL FUNCTION PANEL
Albumin: 3.6 g/dL (ref 3.5–5.0)
Anion gap: 10 (ref 5–15)
BUN: 18 mg/dL (ref 8–23)
CO2: 27 mmol/L (ref 22–32)
Calcium: 9 mg/dL (ref 8.9–10.3)
Chloride: 104 mmol/L (ref 98–111)
Creatinine, Ser: 1.28 mg/dL — ABNORMAL HIGH (ref 0.44–1.00)
GFR, Estimated: 43 mL/min — ABNORMAL LOW
Glucose, Bld: 247 mg/dL — ABNORMAL HIGH (ref 70–99)
Phosphorus: 2.9 mg/dL (ref 2.5–4.6)
Potassium: 4.5 mmol/L (ref 3.5–5.1)
Sodium: 141 mmol/L (ref 135–145)

## 2025-01-06 LAB — GLUCOSE, CAPILLARY
Glucose-Capillary: 132 mg/dL — ABNORMAL HIGH (ref 70–99)
Glucose-Capillary: 209 mg/dL — ABNORMAL HIGH (ref 70–99)

## 2025-01-06 NOTE — TOC Transition Note (Signed)
 Transition of Care [facility]) - Discharge Note   Patient Details  Name: [name] [name] MRN: [id number] Date of Birth: 1946-12-07  Transition of Care [facility]) CM/SW Contact:  Sonda Manuella Quill, RN Phone Number: 01/06/2025, 11:30 AM   Clinical Narrative:    D/C orders received; [facility] services, Rollator, and home oxygen  previously arranged; no IP CM needs.   Final next level of care: Home w Home Health Services Barriers to Discharge: No Barriers Identified   Patient Goals and CMS Choice Patient states their goals for this hospitalization and ongoing recovery are:: home          Discharge Placement                       Discharge Plan and Services Additional resources added to the After Visit Summary for     Discharge Planning Services: CM Consult            DME Arranged: Oxygen , Walker rolling with seat DME Agency: [employer] Date DME Agency Contacted: 01/05/25 Time DME Agency Contacted: [phone] Representative spoke with at DME Agency: Mar[facility] HH Arranged: PT, OT HH Agency: [facility] Date [facility] Agency Contacted: 01/05/25 Time HH Agency Contacted: [phone] Representative spoke with at [facility] Agency: [facility]  Social Drivers of Health (SDOH) Interventions SDOH Screenings   Food Insecurity: No Food Insecurity (01/05/2025)  Housing: Low Risk (01/05/2025)  Transportation Needs: No Transportation Needs (01/05/2025)  Utilities: At Risk (01/05/2025)  Alcohol Screen: Low Risk (03/29/2024)  Depression (PHQ2-9): Low Risk (11/07/2024)  Financial Resource Strain: Low Risk (03/29/2024)  Physical Activity: Insufficiently Active (03/29/2024)  Social Connections: Moderately Integrated (12/28/2024)  Stress: No Stress Concern Present (03/29/2024)  Tobacco Use: High Risk (12/29/2024)  Health Literacy: Adequate Health Literacy (03/29/2024)     Readmission Risk Interventions    01/05/2025    4:17 PM 01/01/2025    8:50 AM 02/06/2024    1:26 PM  Readmission Risk Prevention Plan  Transportation  Screening Complete Complete Complete  PCP or Specialist Appt within 5-7 Days   Complete  PCP or Specialist Appt within 3-5 Days  Complete   Home Care Screening   Complete  Medication Review (RN CM)   Complete  HRI or Home Care Consult Complete Complete   Social Work Consult for Recovery Care Planning/Counseling Complete Complete   Palliative Care Screening Not Applicable Not Applicable   Medication Review Oceanographer) Complete Complete

## 2025-01-06 NOTE — Plan of Care (Signed)
  Problem: Coping: Goal: Ability to adjust to condition or change in health will improve Outcome: Progressing   Problem: Nutritional: Goal: Maintenance of adequate nutrition will improve Outcome: Progressing   Problem: Skin Integrity: Goal: Risk for impaired skin integrity will decrease Outcome: Progressing   Problem: Activity: Goal: Risk for activity intolerance will decrease Outcome: Progressing   Problem: Safety: Goal: Ability to remain free from injury will improve Outcome: Progressing

## 2025-01-06 NOTE — Discharge Summary (Signed)
 Physician Discharge Summary  Patient ID: [name] [name] MRN: [id number] DOB/AGE: 1946/12/06 79 y.o.  Admit date: 12/28/2024 Discharge date: 01/06/2025  Admission Diagnoses:  Discharge Diagnoses:  Principal Problem:   COPD with acute exacerbation (HCC) Active Problems:   CLL (chronic lymphocytic leukemia) (HCC)   Acute hypoxic respiratory failure (HCC)   Discharged Condition: stable  Hospital Course: Patient is a 79 year old female with past medical history significant for asthma, chronic diastolic CHF, chronic kidney disease, CLL, brain tumor, COPD, CVA, diabetes mellitus, PVD, hypertension and hyperlipidemia.  Patient was admitted with difficulty breathing.  Workup revealed BNP of 77.6.  Hemoglobin A1c 8.0, COVID/influenza/RSV PCR negative.  Respiratory viral panel was positive for rhinovirus.  Chest x-ray revealed no acute findings.  CT angiogram chest revealed emphysema with diffuse bronchial wall thickening suggestive of small airway disease, moderate central lobar emphysema, persistent mid to distal esophageal wall thickening, interval development intermediate bilateral axillary lymphadenopathy.  No pulmonary embolism, patient was admitted and managed with steroids, nebulizer DuoNeb, oral azithromycin , nebs Pulmicort  and Yupelri .  Patient was also managed supportively.  During the hospital stay, blood sugar was noted to be significantly elevated at some point, with associated hyperkalemia.  Blood sugar and potassium level improved significantly prior to discharge.  Patient will follow-up with primary care provider on discharge.  Acute COPD exacerbation Acute hypoxic respiratory failure, POA Rhinovirus viral infection -Patient presented with 3-day history of progressive shortness of breath, cough, subjective fevers and bodyaches in the setting of known sick contact; spouse tested positive for COVID-19 recently.  On EMS arrival, patient was noted to be hypoxic, with SpO2 in the 70s.  At  baseline, patient was not oxygen  dependent.  COVID/influenza/RSV PCR negative, although respiratory viral panel positive for rhinovirus.  Initially required 3 L nasal, and on ED presentation uptitrated to 30 L heated high flow nasal cannula.   -Controlled with Trelegy Ellipta  at baseline. -- Patient was manage treated nebs Pulmicort , Brovana , Yupelri , steroids and azithromycin .   -- Incentive spirometry -- PT evaluation - Ambulatory ambulatory O2 screening prior to discharge.     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 Onesimo.   Currently on surveillance.   WBC count elevated 61.9 on admission.  Peripheral smear notable for potential transformation. -- WBC 61.9>62.3>58.9>74.1 (was 61.4 on 12/07/24), but 18.3 on 08/17/2024 -Patient will follow-up with oncology team on discharge.   Right renal mass concerning for neoplasm -CT abdomen/pelvis revealed a large central lesion upper pole right kidney measuring 2.6 x 1.6 cm.   -MR abdomen/pelvis with and without contrast with 2 lesions right kidney upper pole concerning for renal neoplasm, no metastatic disease within the abdomen, innumerable 5 mm or smaller size arterial enhancing foci through the liver. -- Medical oncology following as above -- Flow cytometry ordered and pending -- Will need outpatient follow-up with urology   CKD stage IIIb -- Cr 1.60>1.58>1.38>1.49, 1.45>1.40; stable (baseline 1.3 - 1.5) 01/05/2024: Serum creatinine of 1.75 today.  Check urinalysis and urine sodium.   Hyperkalemia: - Resolved. -Managed with Lokelma . - Blood sugar at noted to be significantly elevated at the time. - Potassium of 4.5 prior to discharge. - Repeat BMP within 2 to 3 days of discharge.  DM2, with hyperglycemia Hemoglobin A1C 8.0 on 12/28/2024, not optimally controlled.   -Home regimen includes Jardiance , metformin  1000 g p.o. every morning -- Moderate SSI for coverage -- CBG before every meal/at  bedtime -Further management of diabetes mellitus as per primary care provider.  Hyperlipidemia:  -- Atorvastatin  80 mg p.o. daily   GERD -- Continue Pepcid  20 mg p.o. nightly   Depression -- Cymbalta  60 mg p.o. daily -- Bupropion  100 g p.o. daily   Neuropathy -- Continue gabapentin , Cymbalta    Tobacco use disorder Counseled on need for complete cessation/abstinence.  On bupropion     Consults: None  Significant Diagnostic Studies:  CT ABDOMEN AND PELVIS WITH CONTRAST: 1. Enlarging solid-appearing lesion in the posterolateral cortex of the upper pole of the right kidney, measuring 2.6 x 1.6 cm, increased from 2.1 x 1.1 cm on the previous examination, suspicious for neoplasm; recommend urology consultation and further characterization with renal protocol MRI or CT without and with contrast. 2. No evidence of metastatic disease in the abdomen or pelvis.  CTA CHEST: 1. No pulmonary embolus. 2. Emphysema with diffuse bronchial wall thickening suggestive of small airway disease. Moderate centrilobular emphysema; pulmonary emphysema is an independent risk factor for lung cancer, recommend consideration for evaluation for a low-dose CT lung cancer screening program. 3. Persistent mid to distal esophageal wall thickening with limited evaluation due to contrast timing. Correlate with signs and symptoms of esophagitis. Consider direct visualization for further evaluation. 4. Interval development of indeterminate bilateral axillary lymphadenopathy. Underlying mental proliferative disorder not excluded. Recommend attention on follow-up.   Discharge Exam: Blood pressure (!) 167/74, pulse 70, temperature 98.3 F (36.8 C), temperature source Oral, resp. rate 20, height 5' 2 (1.575 m), weight 63.9 kg, SpO2 96%.   Disposition: Discharge disposition: 01-Home or Self Care       Discharge Instructions     Increase activity slowly   Complete by: As directed    Increase  activity slowly   Complete by: As directed       Allergies as of 01/06/2025       Reactions   Gazyva  [obinutuzumab ] Shortness Of Breath   Acute respiratory distress   Aspirin     Abdominal pain.   No [facility] Food Allergies Diarrhea, Nausea And Vomiting   Cabbage, stomach pain    Aspirin  Other (See Comments)   irritates stomach   Baclofen  Other (See Comments)   Stomach irritation        Medication List     STOP taking these medications    cholecalciferol 25 MCG (1000 UNIT) tablet Commonly known as: VITAMIN D3   Ferrex 150 150 MG capsule Generic drug: iron  polysaccharides   furosemide  20 MG tablet Commonly known as: LASIX    lidocaine  5 % Commonly known as: LIDODERM    metFORMIN  500 MG 24 hr tablet Commonly known as: GLUCOPHAGE -XR   tiZANidine  2 MG tablet Commonly known as: ZANAFLEX    varenicline  1 MG tablet Commonly known as: CHANTIX        TAKE these medications    acetaminophen  325 MG tablet Commonly known as: TYLENOL  Take 2 tablets (650 mg total) by mouth every 6 (six) hours as needed for mild pain (pain score 1-3) or fever (or Fever >/= 101). What changed:  medication strength how much to take reasons to take this   albuterol  108 (90 Base) MCG/ACT inhaler Commonly known as: VENTOLIN  HFA Inhale 2 puffs by mouth into the lungs every 4 hours as needed for wheezing/shortness of breath   albuterol  (2.5 MG/3ML) 0.083% nebulizer solution Commonly known as: PROVENTIL  Inhale 1 vial via nebulizer every 6 hours for wheezing or shortness of breath as needed   amLODipine  10 MG tablet Commonly known as: NORVASC  Take 1 tablet by mouth every day   atorvastatin  80 MG  tablet Commonly known as: LIPITOR  Take 1 tablet by mouth daily   buPROPion  150 MG 24 hr tablet Commonly known as: Wellbutrin  XL Take 1 tablet (150 mg total) by mouth daily. For smoking cessation   clopidogrel  75 MG tablet Commonly known as: PLAVIX  Take 1 tablet by mouth every day    DULoxetine  60 MG capsule Commonly known as: CYMBALTA  Take 1 capsule by mouth every day   famotidine  20 MG tablet Commonly known as: PEPCID  Take 1 tablet (20 mg total) by mouth at bedtime.   gabapentin  600 MG tablet Commonly known as: Neurontin  Take 1 tablet (600 mg total) by mouth at bedtime.   Jardiance  25 MG Tabs tablet Generic drug: empagliflozin  Take 1 tablet by mouth every day   losartan  50 MG tablet Commonly known as: COZAAR  Take 1 tablet by mouth at bedtime   montelukast  10 MG tablet Commonly known as: SINGULAIR  Take 1 tablet by mouth at bedtime   nitroGLYCERIN  0.4 MG SL tablet Commonly known as: NITROSTAT  Dissolve 1 tablet under tongue every 5 minutes, up to 3 doses for chest pain   polyethylene glycol 17 g packet Commonly known as: MIRALAX  / GLYCOLAX  Take 17 g by mouth daily as needed for mild constipation.   predniSONE  10 MG tablet Commonly known as: DELTASONE  Prednisone  60 mg p.o. once daily for 3 days, then 40 mg p.o. once daily for 3 days, then 30 mg p.o. once daily for 3 days, then 20 mg p.o. once a day for 3 days, then 10 mg p.o. once daily for 3 days and stop.   senna-docusate 8.6-50 MG tablet Commonly known as: Senokot-S Take 1 tablet by mouth 2 (two) times daily.   Trelegy Ellipta  100-62.5-25 MCG/ACT Aepb Generic drug: Fluticasone -Umeclidin-Vilant Inhale 1 puff by mouth every day   Trulicity  0.75 MG/0.5ML Soaj Generic drug: Dulaglutide  Inject 0.75 mg subcutaneously once a week               Durable Medical Equipment  (From admission, onward)           Start     Ordered   01/05/25 1543  For home use only DME 4 wheeled rolling walker with seat  Once       Question:  Patient needs a walker to treat with the following condition  Answer:  Vannie as ambulation aid   01/05/25 1542   01/05/25 1453  DME Oxygen   Once       Question Answer Comment  Length of Need 6 Months   Mode or (Route) Nasal cannula   Liters per Minute 2   Oxygen   delivery system: Gas      01/05/25 1453            Contact information for follow-up providers     Connect with your PCP/Specialist as discussed. Schedule an appointment as soon as possible for a visit .   Contact information: [email or web address] Call our physician referral line at [phone].        [facility]  Follow up.   Why: Agency will provide home oxygen  and deliver travel tank to room. They will also deliver Rollator to room. Contact information: [address] [place] [address] [phone]              Contact information for after-discharge care     Durable Medical Equipment     [facility] (DME) .   Service: Durable Medical Equipment Contact information: [address] [place]  [phone]  [phone]             Home Medical Care     [facility]) .   Service: Home Health Services Contact information: [address] [place]  [address] [phone]                    Time spent: 21 Minutes  Signed: Leatrice LILLETTE Chapel 01/06/2025, 2:10 PM

## 2025-01-07 LAB — FLOW CYTOMETRY

## 2025-01-08 ENCOUNTER — Encounter: Payer: Self-pay | Admitting: Family Medicine

## 2025-01-08 ENCOUNTER — Ambulatory Visit (INDEPENDENT_AMBULATORY_CARE_PROVIDER_SITE_OTHER): Admitting: Family Medicine

## 2025-01-08 VITALS — BP 133/72 | HR 74 | Wt 135.8 lb

## 2025-01-08 DIAGNOSIS — E1151 Type 2 diabetes mellitus with diabetic peripheral angiopathy without gangrene: Secondary | ICD-10-CM

## 2025-01-08 DIAGNOSIS — J441 Chronic obstructive pulmonary disease with (acute) exacerbation: Secondary | ICD-10-CM | POA: Diagnosis not present

## 2025-01-08 LAB — SURGICAL PATHOLOGY

## 2025-01-08 MED ORDER — METFORMIN HCL ER 500 MG PO TB24: 1000.0000 mg | ORAL_TABLET | Freq: Every day | ORAL | 3 refills | Status: AC

## 2025-01-08 NOTE — Patient Instructions (Addendum)
 1) You can continue taking your regular pill packs EXCEPT don't take the lasix  (the smallest oval shaped white pill in your morning packs)  2) Take lokelma  once a day for the next 5 days.   3) Take prednisone  2 tablets with breakfast, lunch, and dinner (3 times a day) for the next 5 days.

## 2025-01-08 NOTE — Progress Notes (Unsigned)
" ° ° °  SUBJECTIVE:   CHIEF COMPLAINT / HPI:     AS is a 79yo F that pf hospital f/u.  -Recently admitted 1/2 for COPD exacerbation 2/2 rhinovirus; tx w/ steroids, azithromycin , breathing treatments.  - She gets pill packs for her home meds and then also had medication changes upon discharge. She reports being very confused by what to take so she has not taken her meds since discharge. - She reports that her breathing feels a little better, but still not at baseline - Smoking about 4 cigs a day, which is better than prior     PERTINENT  PMH / PSH: COPD, CLL, CKD, T2DM  OBJECTIVE:   BP 133/72   Pulse 74   Wt 135 lb 12.8 oz (61.6 kg)   SpO2 94%   BMI 24.84 kg/m   General: Alert, pleasant woman. NAD. HEENT: NCAT. MMM. CV: RRR, no murmurs.   Resp: Diffuse wheezing in all lung fields. Normal WOB on RA.  Abm: Soft, nontender, nondistended. BS present. Ext: Moves all ext spontaneously Skin: Warm, well perfused   ASSESSMENT/PLAN:   Assessment & Plan Chronic obstructive pulmonary disease with acute exacerbation (HCC) Overall improving since discharge, except havign medication confusion. Has not been taking lokelma  or prednisone . Was discharged on O2, but pt was incorrectly using oxygen  tank and was just on RA. On RA at baseline. Reviewed medications, advised to hold lasix  as instructed on DC summary. Also advise to take prednisone  taper and lokelma  as prescribed for 5 day course.  - BMP today to assess K - Cont prednison taper - Clarified medication list. Advised to hold lasix  until f/u - f/u in 1-2 wks to ensure pt taking medications properly - Wean O2 as tolerated to RA - Encourgaed smoking cessation. Warned against smoking while on O2.      Twyla Nearing, MD [facility] "

## 2025-01-09 LAB — MAGNESIUM: Magnesium: 2.1 mg/dL (ref 1.6–2.3)

## 2025-01-09 LAB — BASIC METABOLIC PANEL WITH GFR
BUN/Creatinine Ratio: 12 (ref 12–28)
BUN: 20 mg/dL (ref 8–27)
CO2: 25 mmol/L (ref 20–29)
Calcium: 9.8 mg/dL (ref 8.7–10.3)
Chloride: 101 mmol/L (ref 96–106)
Creatinine, Ser: 1.61 mg/dL — ABNORMAL HIGH (ref 0.57–1.00)
Glucose: 200 mg/dL — ABNORMAL HIGH (ref 70–99)
Potassium: 4 mmol/L (ref 3.5–5.2)
Sodium: 143 mmol/L (ref 134–144)
eGFR: 33 mL/min/1.73 — ABNORMAL LOW

## 2025-01-10 NOTE — Assessment & Plan Note (Signed)
 Overall improving since discharge, except havign medication confusion. Has not been taking lokelma  or prednisone . Was discharged on O2, but pt was incorrectly using oxygen  tank and was just on RA. On RA at baseline. Reviewed medications, advised to hold lasix  as instructed on DC summary. Also advise to take prednisone  taper and lokelma  as prescribed for 5 day course.  - BMP today to assess K - Cont prednison taper - Clarified medication list. Advised to hold lasix  until f/u - f/u in 1-2 wks to ensure pt taking medications properly - Wean O2 as tolerated to RA - Encourgaed smoking cessation. Warned against smoking while on O2.

## 2025-01-14 ENCOUNTER — Other Ambulatory Visit: Payer: Self-pay

## 2025-01-14 DIAGNOSIS — C9110 Chronic lymphocytic leukemia of B-cell type not having achieved remission: Secondary | ICD-10-CM

## 2025-01-15 ENCOUNTER — Inpatient Hospital Stay: Admitting: Hematology

## 2025-01-15 ENCOUNTER — Inpatient Hospital Stay: Attending: Physician Assistant

## 2025-01-15 NOTE — Progress Notes (Incomplete)
 " HEMATOLOGY ONCOLOGY PROGRESS NOTE  Date of service: 01/15/2025  Patient Care Team: Diona Perkins, MD as PCP - General Shlomo Wilbert SAUNDERS, MD as PCP - Cardiology (Cardiology) Onesimo Emaline Brink, MD as Consulting Physician (Hematology) [place], [place], [place] as Referring Physician (Optometry) Abran Norleen SAILOR, MD as Consulting Physician (Gastroenterology)  CHIEF COMPLAINT/PURPOSE OF CONSULTATION: Follow-up for continued evaluation and management of CLL.  HISTORY OF PRESENTING ILLNESS: Please see previous note for details on initial presentation.   SUMMARY OF ONCOLOGIC HISTORY: Oncology History  CLL (chronic lymphocytic leukemia) (HCC)  04/11/2009 Initial Diagnosis   Chronic lymphocytic leukemia (HCC)   03/06/2021 - 03/06/2021 Chemotherapy           INTERVAL HISTORY: [name] [name] is a 79 y.o. female who is here today for continued evaluation and management of CLL. {ELcompanions/ambulations (Optional):33762}  she was last seen by me on 08/17/2024; at the time she did not have any concerns and was doing well.   Today, she  Denies any  []  new infection issues,  []  fevers/chills,  []  drenching night sweats,  []  unexpected weight change,  []  back pain,  []  chest pain,  []  abdominal pain,  []  new bone pains,  []  new lumps/bumps,  []  leg swelling,  []  bleeding issues (nose bleeds, gum bleeds, abnormal/spontaneous bruising),  []  nausea/vomiting,  []  diarrhea,  []  bowel/urinary changes, []  SOB,  []  change in breathing        REVIEW OF SYSTEMS:   10 Point review of systems of done and is negative except as noted above.  MEDICAL HISTORY Past Medical History:  Diagnosis Date   Allergy    environmental   Asthma    Brain tumor (HCC)    Chronic diastolic CHF (congestive heart failure) (HCC)    CKD (chronic kidney disease)    CLL (chronic lymphocytic leukemia) (HCC)    Constipation 11/15/2011   COPD (chronic obstructive pulmonary disease) (HCC)    CVA (cerebral vascular  accident) (HCC)    Diabetes (HCC)    GERD (gastroesophageal reflux disease)    Headache    migraines   Hyperlipidemia    Hypertension    Pneumonia    PVD (peripheral vascular disease)    Stroke (HCC) 1998   in 1998 due to tumor-right side    SURGICAL HISTORY Past Surgical History:  Procedure Laterality Date   ABDOMINAL AORTOGRAM W/LOWER EXTREMITY Bilateral 08/12/2020   Procedure: ABDOMINAL AORTOGRAM W/BILATERAL LOWER EXTREMITY RUNOFF;  Surgeon: Serene Gaile ORN, MD;  Location: [facility];  Service: Cardiovascular;  Laterality: Bilateral;   ABDOMINAL AORTOGRAM W/LOWER EXTREMITY N/A 06/05/2024   Procedure: ABDOMINAL AORTOGRAM W/LOWER EXTREMITY;  Surgeon: Serene Gaile ORN, MD;  Location: [facility];  Service: Cardiovascular;  Laterality: N/A;   ABI  02/2012   ABI <0.65 BL 02/2012   BRAIN MENINGIOMA EXCISION     BRAIN TUMOR EXCISION     Bypass grafting of RLE for PAD claudication      CARDIAC CATHETERIZATION  04/09/208   CESAREAN SECTION     1974, 78 ,80   CHOLECYSTECTOMY, LAPAROSCOPIC     COLONOSCOPY  2014   [place], [place]   ENDARTERECTOMY FEMORAL Right 10/03/2020   Procedure: RIGHT FEMORAL ENDARTERECTOMY WITH VEIN PATCH ANGIOPLASTY;  Surgeon: Serene Gaile ORN, MD;  Location: [facility];  Service: Vascular;  Laterality: Right;   ESOPHAGEAL DILATION     FEMORAL-POPLITEAL BYPASS GRAFT Right 10/03/2020   Procedure: RIGHT FEMORAL- BELOW KNEE POPLITEAL BYPASS USING GORE PROPATEN  GRAFT;  Surgeon: Serene Gaile ORN, MD;  Location: [facility] OR;  Service: Vascular;  Laterality: Right;   LOWER EXTREMITY ANGIOGRAPHY N/A 06/05/2024   Procedure: Lower Extremity Angiography;  Surgeon: Serene Gaile ORN, MD;  Location: [facility];  Service: Cardiovascular;  Laterality: N/A;   PATCH ANGIOPLASTY  10/03/2020   Procedure: VEIN PATCH ANGIOPLASTY;  Surgeon: Serene Gaile ORN, MD;  Location: [facility];  Service: Vascular;;    SOCIAL HISTORY Social History[1]  Social History   Social History  Narrative   ** Merged History Encounter ** Health Care POA:    Emergency Contact: Jamee Brakeman [phone] (c)   End of Life Plan:    Who lives with you: Lives with husband   Any pets: none   Diet: Patient lacks financial resources for much food. Pt reports eating what is available.   Exercise: Patient does not have an exercise plan.   Seatbelts: Patient reports wearing seatbelt when in vehicle.    [place] Exposure/Protection:   Hobbies: Bowling, computer games, Bingo      Has financial difficulties and transportation issues as she and her husband share transportation     Retired        [employer] SDOH Screenings   Food Insecurity: No Food Insecurity (01/05/2025)  Housing: Low Risk (01/05/2025)  Transportation Needs: No Transportation Needs (01/05/2025)  Utilities: At Risk (01/05/2025)  Alcohol Screen: Low Risk (03/29/2024)  Depression (PHQ2-9): Low Risk (01/08/2025)  Financial Resource Strain: Low Risk (03/29/2024)  Physical Activity: Insufficiently Active (03/29/2024)  Social Connections: Moderately Integrated (12/28/2024)  Stress: No Stress Concern Present (03/29/2024)  Tobacco Use: High Risk (01/08/2025)  Health Literacy: Adequate Health Literacy (03/29/2024)     FAMILY HISTORY Family History  Problem Relation Age of Onset   Heart disease Mother    Asthma Mother    Cancer Mother        uterine    Depression Mother    Heart attack Mother 44   Hyperlipidemia Mother    Hypertension Mother    Stroke Mother    Kidney disease Mother    Heart attack Sister    Stroke Sister    Depression Sister    Diabetes Sister    Hyperlipidemia Sister    Depression Sister    Diabetes Sister    Hyperlipidemia Sister    HIV/AIDS Sister    Diabetes Brother    Asthma Brother    Hypertension Brother    Cancer Brother        colon   HIV/AIDS Brother    Cancer Maternal Aunt        lung   Heart disease Maternal Grandmother    Heart attack Maternal Grandmother    Hypertension Daughter     Colon cancer Neg Hx    Neuropathy Neg Hx      ALLERGIES: is allergic to gazyva  [obinutuzumab ], aspirin , no healthtouch food allergies, aspirin , and baclofen .  MEDICATIONS  Current Outpatient Medications  Medication Sig Dispense Refill   acetaminophen  (TYLENOL ) 325 MG tablet Take 2 tablets (650 mg total) by mouth every 6 (six) hours as needed for mild pain (pain score 1-3) or fever (or Fever >/= 101).     albuterol  (PROVENTIL ) (2.5 MG/3ML) 0.083% nebulizer solution Inhale 1 vial via nebulizer every 6 hours for wheezing or shortness of breath as needed 75 mL 11   albuterol  (VENTOLIN  HFA) 108 (90 Base) MCG/ACT inhaler Inhale 2 puffs by mouth into the lungs every 4 hours as needed for wheezing/shortness  of breath 8.5 each 11   amLODipine  (NORVASC ) 10 MG tablet Take 1 tablet by mouth every day 30 tablet 11   atorvastatin  (LIPITOR ) 80 MG tablet Take 1 tablet by mouth daily 30 tablet 11   buPROPion  (WELLBUTRIN  XL) 150 MG 24 hr tablet Take 1 tablet (150 mg total) by mouth daily. For smoking cessation 90 tablet 1   clopidogrel  (PLAVIX ) 75 MG tablet Take 1 tablet by mouth every day 30 tablet 11   DULoxetine  (CYMBALTA ) 60 MG capsule Take 1 capsule by mouth every day 30 capsule 11   famotidine  (PEPCID ) 20 MG tablet Take 1 tablet (20 mg total) by mouth at bedtime. 30 tablet 5   gabapentin  (NEURONTIN ) 600 MG tablet Take 1 tablet (600 mg total) by mouth at bedtime. 90 tablet 1   JARDIANCE  25 MG TABS tablet Take 1 tablet by mouth every day 30 tablet 11   losartan  (COZAAR ) 50 MG tablet Take 1 tablet by mouth at bedtime 30 tablet 11   metFORMIN  (GLUCOPHAGE -XR) 500 MG 24 hr tablet Take 2 tablets (1,000 mg total) by mouth daily. 360 tablet 3   montelukast  (SINGULAIR ) 10 MG tablet Take 1 tablet by mouth at bedtime 30 tablet 11   nitroGLYCERIN  (NITROSTAT ) 0.4 MG SL tablet Dissolve 1 tablet under tongue every 5 minutes, up to 3 doses for chest pain 25 tablet 11   polyethylene glycol (MIRALAX  / GLYCOLAX ) 17 g  packet Take 17 g by mouth daily as needed for mild constipation. 14 each 0   predniSONE  (DELTASONE ) 10 MG tablet Prednisone  60 mg p.o. once daily for 3 days, then 40 mg p.o. once daily for 3 days, then 30 mg p.o. once daily for 3 days, then 20 mg p.o. once a day for 3 days, then 10 mg p.o. once daily for 3 days and stop. 48 tablet 0   senna-docusate (SENOKOT-S) 8.6-50 MG tablet Take 1 tablet by mouth 2 (two) times daily. 60 tablet 0   TRELEGY ELLIPTA  100-62.5-25 MCG/ACT AEPB Inhale 1 puff by mouth every day 60 each 11   TRULICITY  0.75 MG/0.5ML SOAJ Inject 0.75 mg subcutaneously once a week 2 mL 11   No current facility-administered medications for this visit.    PHYSICAL EXAMINATION: ECOG PERFORMANCE STATUS: 1 - Symptomatic but completely ambulatory VITALS: There were no vitals filed for this visit. There were no vitals filed for this visit. There is no height or weight on file to calculate BMI.  GENERAL: alert, in no acute distress and comfortable SKIN: no acute rashes, no significant lesions EYES: conjunctiva are pink and non-injected, sclera anicteric OROPHARYNX: MMM, no exudates, no oropharyngeal erythema or ulceration NECK: supple, no JVD LYMPH:  no palpable lymphadenopathy in the cervical, axillary or inguinal regions LUNGS: clear to auscultation b/l with normal respiratory effort HEART: regular rate & rhythm ABDOMEN:  normoactive bowel sounds , non tender, not distended, no hepatosplenomegaly Extremity: no pedal edema PSYCH: alert & oriented x 3 with fluent speech NEURO: no focal motor/sensory deficits  LABORATORY DATA:   I have reviewed the data as listed     Latest Ref Rng & Units 01/05/2025    4:40 AM 01/04/2025    4:46 AM 01/03/2025    1:43 PM  CBC EXTENDED  WBC 4.0 - 10.5 K/uL 65.6  71.1  77.6   RBC 3.87 - 5.11 MIL/uL 3.97  3.82  4.44   Hemoglobin 12.0 - 15.0 g/dL 88.7  88.9  87.1   HCT 36.0 - 46.0 % 37.0  35.8  41.6   Platelets 150 - 400 K/uL 123  128  149   NEUT#  1.7 - 7.7 K/uL 12.1  9.2  15.5   Lymph# 0.7 - 4.0 K/uL 32.4  59.7  59.0        Latest Ref Rng & Units 01/08/2025   11:18 AM 01/06/2025    4:33 AM 01/05/2025    4:41 AM  CMP  Glucose 70 - 99 mg/dL 799  752  649   BUN 8 - 27 mg/dL 20  18  26    Creatinine 0.57 - 1.00 mg/dL 8.38  8.71  8.54   Sodium 134 - 144 mmol/L 143  141  138   Potassium 3.5 - 5.2 mmol/L 4.0  4.5  5.6   Chloride 96 - 106 mmol/L 101  104  101   CO2 20 - 29 mmol/L 25  27  25    Calcium  8.7 - 10.3 mg/dL 9.8  9.0  8.9              RADIOGRAPHIC STUDIES: I have personally reviewed the radiological images as listed and agreed with the findings in the report. MR ABDOMEN W WO CONTRAST Result Date: 01/02/2025 CLINICAL DATA:  Right kidney lesion. EXAM: MRI ABDOMEN WITHOUT AND WITH CONTRAST TECHNIQUE: Multiplanar multisequence MR imaging of the abdomen was performed both before and after the administration of intravenous contrast. CONTRAST:  6mL GADAVIST  GADOBUTROL  1 MMOL/ML IV SOLN COMPARISON:  CT scan abdomen and pelvis from 12/29/2024. FINDINGS: Lower chest: Unremarkable MR appearance to the lung bases. No pleural effusion. No pericardial effusion. Normal heart size. Hepatobiliary: The liver is normal in size. Noncirrhotic configuration. No suspicious liver lesion. There are innumerable 5 mm or smaller sized arterial hyperenhancing foci throughout the liver which are not seen on T1 weighted, T2 weighted or diffusion-weighted images. These foci are also not seen on the rest of the postcontrast sequences. In patients without known liver disease, these are referred to as flash lesion and favored benign likely due to arterial portal shunts, flash filling hemangioma, FNH, etc. No suspicious liver lesion. No intrahepatic or extrahepatic bile duct dilatation. No choledocholithiasis. Status post cholecystectomy. Pancreas: Normal T1 hyperintense signal intensity of the pancreas. No peripancreatic fat stranding. Main pancreatic duct is not  dilated. There are 2 peripancreatic lymph nodes (series 6, images 16 and 17), which are nonspecific but favored benign/reactive. Spleen: Size within normal limits. There are several scattered cysts in the spleen. Adrenals/Urinary Tract: Unremarkable adrenal glands. No hydroureteronephrosis. There are multiple scattered subcentimeter sized simple cysts throughout bilateral kidneys. There are 2 adjacent heterogeneous, T2 hypointense areas in the right kidney upper pole, posterolaterally, which corresponds to the lesion described on the recent CT scan abdomen and pelvis. The larger 1.4 x 1.7 cm lesion (series 13, image 39), is in the posterior aspect and superiorly located in comparison to the second lesion. The larger lesion exhibits multiple T1 hyperintense foci which are nonenhancing. However, the rest of the lesion exhibits enhancement on the postcontrast images. The smaller 1.3 x 1.4 cm lesion is anterior and inferior to the larger lesion. This lesion also exhibits heterogeneous enhancement on the postcontrast images. These both are concerning for renal neoplasm. None of the lesion reaches up to the sinus fat. Right renal vein is patent. No perirenal lymphadenopathy. Stomach/Bowel: There is a small sliding hiatal hernia. There is dilated and fluid-filled visualized lower thoracic esophagus, nonspecific but commonly seen with esophageal dysmotility or reflux disease. Visualized portions within the abdomen are unremarkable.  No disproportionate dilation of bowel loops. Vascular/Lymphatic: No pathologically enlarged lymph nodes identified. No abdominal aortic aneurysm demonstrated. No ascites. Other:  None. Musculoskeletal: No suspicious bone lesions identified. IMPRESSION: 1. There are 2 adjacent lesions in the right kidney upper pole, as described in detail above, which are concerning for renal neoplasm. 2. No metastatic disease identified within the abdomen. 3. Innumerable, 5 mm or smaller sized arterial  hyperenhancing foci throughout the liver, as discussed above. 4. Multiple other nonacute observations, as described above. Electronically Signed   By: Ree Molt M.D.   On: 01/02/2025 12:34   CT ABDOMEN PELVIS W CONTRAST Result Date: 12/29/2024 EXAM: CT ABDOMEN AND PELVIS WITH CONTRAST 12/29/2024 01:33:51 PM TECHNIQUE: CT of the abdomen and pelvis was performed with the administration of 70 mL of iohexol  (OMNIPAQUE ) 300 MG/ML solution. Multiplanar reformatted images are provided for review. Automated exposure control, iterative reconstruction, and/or weight-based adjustment of the mA/kV was utilized to reduce the radiation dose to as low as reasonably achievable. COMPARISON: 02/18/2024 CLINICAL HISTORY: Metastatic disease evaluation FINDINGS: LOWER CHEST: No acute abnormality. LIVER: The liver is unremarkable. GALLBLADDER AND BILE DUCTS: Status post cholecystectomy. The common bile duct is at the upper limits of normal measuring 7 mm in maximum diameter. Unchanged when compared with the previous exam. SPLEEN: Again seen are several low-attenuation lesions within the spleen similar to previous exam. The largest is in the splenic hilar region measuring 1.1 cm, image 32/2. PANCREAS: Pancreas is normal in size and contour without focal lesion or ductal dilatation. ADRENAL GLANDS: Normal adrenal glands. KIDNEYS, URETERS AND BLADDER: Arising within the posterolateral cortex of the upper pole of the right kidney is a solid-appearing lesion which measures 2.6 x 1.6 cm, image 11/11. On the previous examination this measured 2.1 x 1.1 cm. Bosniak class 2 cyst is noted within the lateral cortex of the interpolar right kidney, measuring 6 mm, image 15/11. Per consensus, no follow-up is needed for simple Bosniak type 1 and 2 renal cysts, unless the patient has a malignancy history or risk factors. No stones in the kidneys or ureters. No hydronephrosis. No perinephric or periureteral stranding. Urinary bladder is  unremarkable. GI AND BOWEL: Stomach demonstrates no acute abnormality. Appendix is visualized and normal in caliber, without wall thickening, periappendiceal inflammation, or fluid. No bowel wall thickening, inflammation, or distention. There is no bowel obstruction. PERITONEUM AND RETROPERITONEUM: No free fluid or fluid collections. No free air. VASCULATURE: Aorta is normal in caliber. Extensive aortic atherosclerotic calcifications. LYMPH NODES: No lymphadenopathy. REPRODUCTIVE ORGANS: No acute abnormality. BONES AND SOFT TISSUES: No acute osseous abnormality. Left lateral thigh lipoma measures 5.8 x 3.6 cm. IMPRESSION: 1. Enlarging solid-appearing lesion in the posterolateral cortex of the upper pole of the right kidney, measuring 2.6 x 1.6 cm, increased from 2.1 x 1.1 cm on the previous examination, suspicious for neoplasm; recommend urology consultation and further characterization with renal protocol MRI or CT without and with contrast. 2. No evidence of metastatic disease in the abdomen or pelvis. Electronically signed by: Waddell Calk MD 12/29/2024 01:44 PM EST RP Workstation: [id number]   CT Angio Chest Pulmonary Embolism (PE) W or WO Contrast Result Date: 12/28/2024 EXAM: CTA CHEST 12/28/2024 10:20:00 AM TECHNIQUE: CTA of the chest was performed after the administration of intravenous contrast. Multiplanar reformatted images are provided for review. MIP images are provided for review. Automated exposure control, iterative reconstruction, and/or weight based adjustment of the mA/kV was utilized to reduce the radiation dose to as low as reasonably achievable. 50  mL (iohexol  (OMNIPAQUE ) 350 MG/ML injection 50 mL IOHEXOL  350 MG/ML SOLN) was administered. COMPARISON: CT angiography chest 02/18/24 CLINICAL HISTORY: Pulmonary embolism (PE) suspected, high prob. FINDINGS: PULMONARY ARTERIES: Pulmonary arteries are adequately opacified for evaluation. No acute pulmonary embolus. Main pulmonary artery is normal  in caliber. MEDIASTINUM: The heart and pericardium demonstrate no acute abnormality. At least 2-vessel coronary artery calcifications. The thoracic aorta is normal in caliber. Severe atherosclerotic plaque. LYMPH NODES: Interval development of enlarged left axillary lymphadenopathy measuring up to 1.3 cm and large right axillary lymphadenopathy measuring up to 1.5 cm. No mediastinal or hilar lymphadenopathy. LUNGS AND PLEURA: Moderate centrilobular emphysematous changes. Diffuse bronchial wall thickening. Expiratory phase respiration. No focal consolidation or pulmonary edema. No evidence of pleural effusion or pneumothorax. UPPER ABDOMEN: Status post cholecystectomy. Thyroid  unremarkable. Persistent mid to distal esophageal wall thickening with limited evaluation due to timing of contrast. SOFT TISSUES AND BONES: No acute bone or soft tissue abnormality. IMPRESSION: 1. No pulmonary embolus. 2. Emphysema with diffuse bronchial wall thickening suggestive of small airway disease. Moderate centrilobular emphysema; pulmonary emphysema is an independent risk factor for lung cancer, recommend consideration for evaluation for a low-dose CT lung cancer screening program. 3. Persistent mid to distal esophageal wall thickening with limited evaluation due to contrast timing. Correlate with signs and symptoms of esophagitis. Consider direct visualization for further evaluation. 4. Interval development of indeterminate bilateral axillary lymphadenopathy. Underlying mental proliferative disorder not excluded. Recommend attention on follow-up. Electronically signed by: Morgane Naveau MD 12/28/2024 11:19 AM EST RP Workstation: [id number]   DG Chest Portable 1 View Result Date: 12/28/2024 EXAM: 1 VIEW(S) XRAY OF THE CHEST 12/28/2024 03:19:00 AM COMPARISON: 02/18/2024 CLINICAL HISTORY: SOB FINDINGS: LUNGS AND PLEURA: No focal pulmonary opacity. No pleural effusion. No pneumothorax. HEART AND MEDIASTINUM: No acute abnormality of the  cardiac and mediastinal silhouettes. BONES AND SOFT TISSUES: No acute osseous abnormality. IMPRESSION: 1. No acute findings. Electronically signed by: Franky Stanford MD 12/28/2024 03:59 AM EST RP Workstation: [id number]    ASSESSMENT & PLAN:  79 y.o. female with   1. Rai 1 Chronic Lymphocytic Leukemia  - Trisomy 12 mutation. Status post treatment with Gazyva  plus venetoclax .  Currently on active surveillance.   #2 .Lung nodules on CT in 12/2017. Rpt CT chest 08/09/2018 - stable   #3     Patient Active Problem List    Diagnosis Date Noted   Myofascial pain 04/25/2024   Cigarette smoker 04/25/2024   Respiratory distress 02/03/2024   Foraminal stenosis of lumbosacral region 01/14/2023   Right leg swelling 09/03/2022   Meningioma (HCC) 09/03/2022   Polypharmacy 04/07/2022   Iron  deficiency anemia 02/12/2021   Peripheral arterial disease (HCC) 10/03/2020   (HFpEF) heart failure with preserved ejection fraction (HCC) 06/11/2020   Muscle spasm 03/07/2020   Venous insufficiency (chronic) (peripheral) 06/22/2019   Pulmonary nodules 01/04/2018   Mass of left lower leg 01/04/2018   Chronic kidney disease (CKD), stage III (moderate) (HCC) 06/04/2016   Chronic back pain 10/02/2015   DM (diabetes mellitus), type 2 with peripheral vascular complications (HCC) 06/11/2009   Chronic lymphocytic leukemia (HCC) 04/11/2009   HYPERCHOLESTEROLEMIA 02/23/2007   TOBACCO DEPENDENCE 02/23/2007   Essential hypertension 02/23/2007   COPD (chronic obstructive pulmonary disease) (HCC) 02/23/2007   GASTROESOPHAGEAL REFLUX, NO ESOPHAGITIS 02/23/2007       PLAN: - Discussed lab results on 01/15/2025 in detail with patient:   FOLLOW-UP in {WEEKS/MONTHS:30939} for labs and follow-up with Dr. Onesimo.  The total time spent in the appointment  was *** minutes* .  All of the patient's questions were answered and the patient knows to call the clinic with any problems, questions, or concerns.  Emaline Saran MD MS  AAHIVMS [facility] Hematology/Oncology Physician [facility]  *Total Encounter Time as defined by the Centers for Medicare and Medicaid Services includes, in addition to the face-to-face time of a patient visit (documented in the note above) non-face-to-face time: obtaining and reviewing outside history, ordering and reviewing medications, tests or procedures, care coordination (communications with other health care professionals or caregivers) and documentation in the medical record.  I, Alan Blowers, acting as a neurosurgeon for Emaline Saran, MD.,have documented all relevant documentation on the behalf of Emaline Saran, MD,as directed by  Emaline Saran, MD while in the presence of Emaline Saran, MD.  I have reviewed the above documentation for accuracy and completeness, and I agree with the above.  Emaline Saran, MD    [1]  Social History Tobacco Use   Smoking status: Every Day    Current packs/day: 0.25    Average packs/day: 0.3 packs/day for 49.0 years (12.3 ttl pk-yrs)    Types: Cigarettes    Start date: 01/01/1976    Passive exposure: Current   Smokeless tobacco: Never   Tobacco comments:    ~ 3 cigarettes / day  Vaping Use   Vaping status: Never Used  Substance Use Topics   Alcohol use: Not Currently   Drug use: Not Currently   "

## 2025-01-17 ENCOUNTER — Other Ambulatory Visit: Payer: Self-pay | Admitting: Family Medicine

## 2025-01-17 ENCOUNTER — Encounter (HOSPITAL_COMMUNITY): Admission: RE | Admit: 2025-01-17 | Source: Ambulatory Visit

## 2025-01-17 ENCOUNTER — Encounter: Payer: Self-pay | Admitting: Family Medicine

## 2025-01-17 MED ORDER — FAMOTIDINE 10 MG PO TABS: ORAL_TABLET | ORAL | 11 refills | Status: AC

## 2025-01-17 NOTE — Progress Notes (Signed)
 Adjusted famotidine  dosing to 10mg  every other day given calculated CrCl of 28. MyChart message sent.

## 2025-01-18 ENCOUNTER — Other Ambulatory Visit: Payer: Self-pay | Admitting: Family Medicine

## 2025-01-18 DIAGNOSIS — E7800 Pure hypercholesterolemia, unspecified: Secondary | ICD-10-CM

## 2025-01-18 NOTE — Telephone Encounter (Signed)
 Chart reviewed. Rx refilled.

## 2025-02-04 ENCOUNTER — Ambulatory Visit (HOSPITAL_COMMUNITY): Admission: RE | Admit: 2025-02-04

## 2025-02-11 ENCOUNTER — Encounter: Admitting: Physical Medicine and Rehabilitation

## 2025-02-18 ENCOUNTER — Ambulatory Visit: Payer: Self-pay | Admitting: Family Medicine

## 2025-03-15 ENCOUNTER — Inpatient Hospital Stay: Admitting: Hematology

## 2025-03-15 ENCOUNTER — Inpatient Hospital Stay

## 2025-04-01 ENCOUNTER — Encounter
